# Patient Record
Sex: Female | Born: 1955 | Race: Black or African American | Hispanic: No | Smoking: Never smoker
Health system: Southern US, Community
[De-identification: ages and names within clinical notes are randomized; demographics above are authoritative.]

## PROBLEM LIST (undated history)

## (undated) DIAGNOSIS — N183 Chronic kidney disease, stage 3 unspecified: Secondary | ICD-10-CM

## (undated) DIAGNOSIS — I10 Essential (primary) hypertension: Secondary | ICD-10-CM

## (undated) DIAGNOSIS — I219 Acute myocardial infarction, unspecified: Secondary | ICD-10-CM

## (undated) DIAGNOSIS — M1990 Unspecified osteoarthritis, unspecified site: Secondary | ICD-10-CM

## (undated) DIAGNOSIS — Z992 Dependence on renal dialysis: Secondary | ICD-10-CM

## (undated) DIAGNOSIS — Z9289 Personal history of other medical treatment: Secondary | ICD-10-CM

## (undated) DIAGNOSIS — I4891 Unspecified atrial fibrillation: Secondary | ICD-10-CM

## (undated) DIAGNOSIS — E785 Hyperlipidemia, unspecified: Secondary | ICD-10-CM

## (undated) DIAGNOSIS — D649 Anemia, unspecified: Secondary | ICD-10-CM

## (undated) DIAGNOSIS — I2510 Atherosclerotic heart disease of native coronary artery without angina pectoris: Secondary | ICD-10-CM

## (undated) DIAGNOSIS — K5900 Constipation, unspecified: Secondary | ICD-10-CM

## (undated) DIAGNOSIS — E119 Type 2 diabetes mellitus without complications: Secondary | ICD-10-CM

## (undated) HISTORY — DX: Unspecified osteoarthritis, unspecified site: M19.90

## (undated) HISTORY — PX: BREAST BIOPSY: SHX20

## (undated) HISTORY — DX: Atherosclerotic heart disease of native coronary artery without angina pectoris: I25.10

## (undated) HISTORY — PX: ANKLE SURGERY: SHX546

## (undated) HISTORY — DX: Hyperlipidemia, unspecified: E78.5

## (undated) HISTORY — PX: PARTIAL HYSTERECTOMY: SHX80

## (undated) HISTORY — DX: Unspecified atrial fibrillation: I48.91

## (undated) HISTORY — PX: OTHER SURGICAL HISTORY: SHX169

---

## 1998-01-27 ENCOUNTER — Encounter: Admission: RE | Admit: 1998-01-27 | Discharge: 1998-04-27 | Payer: Self-pay | Admitting: Emergency Medicine

## 1998-11-29 ENCOUNTER — Encounter: Admission: RE | Admit: 1998-11-29 | Discharge: 1999-02-27 | Payer: Self-pay | Admitting: Emergency Medicine

## 1999-03-20 ENCOUNTER — Other Ambulatory Visit: Admission: RE | Admit: 1999-03-20 | Discharge: 1999-03-20 | Payer: Self-pay | Admitting: Radiology

## 2003-12-02 ENCOUNTER — Emergency Department (HOSPITAL_COMMUNITY): Admission: EM | Admit: 2003-12-02 | Discharge: 2003-12-02 | Payer: Self-pay | Admitting: Emergency Medicine

## 2005-08-28 ENCOUNTER — Emergency Department (HOSPITAL_COMMUNITY): Admission: EM | Admit: 2005-08-28 | Discharge: 2005-08-28 | Payer: Self-pay | Admitting: Emergency Medicine

## 2006-11-21 ENCOUNTER — Encounter: Admission: RE | Admit: 2006-11-21 | Discharge: 2006-11-21 | Payer: Self-pay | Admitting: Endocrinology

## 2006-12-02 ENCOUNTER — Other Ambulatory Visit: Admission: RE | Admit: 2006-12-02 | Discharge: 2006-12-02 | Payer: Self-pay | Admitting: Interventional Radiology

## 2006-12-02 ENCOUNTER — Encounter: Admission: RE | Admit: 2006-12-02 | Discharge: 2006-12-02 | Payer: Self-pay | Admitting: Endocrinology

## 2006-12-02 ENCOUNTER — Encounter (INDEPENDENT_AMBULATORY_CARE_PROVIDER_SITE_OTHER): Payer: Self-pay | Admitting: *Deleted

## 2009-06-14 ENCOUNTER — Inpatient Hospital Stay (HOSPITAL_COMMUNITY): Admission: EM | Admit: 2009-06-14 | Discharge: 2009-06-16 | Payer: Self-pay | Admitting: Emergency Medicine

## 2009-07-21 ENCOUNTER — Encounter: Payer: Self-pay | Admitting: Family Medicine

## 2009-07-21 ENCOUNTER — Ambulatory Visit: Payer: Self-pay | Admitting: Family Medicine

## 2009-07-21 ENCOUNTER — Observation Stay (HOSPITAL_COMMUNITY): Admission: EM | Admit: 2009-07-21 | Discharge: 2009-07-21 | Payer: Self-pay | Admitting: Emergency Medicine

## 2009-07-21 LAB — CONVERTED CEMR LAB: Microalb, Ur: 14.88 mg/dL — ABNORMAL HIGH (ref 0.00–1.89)

## 2009-10-06 ENCOUNTER — Ambulatory Visit: Payer: Self-pay | Admitting: Internal Medicine

## 2009-10-11 ENCOUNTER — Ambulatory Visit: Payer: Self-pay | Admitting: Internal Medicine

## 2009-10-11 LAB — CONVERTED CEMR LAB
ALT: 14 units/L (ref 0–35)
AST: 17 units/L (ref 0–37)
Albumin: 3.9 g/dL (ref 3.5–5.2)
Alkaline Phosphatase: 75 units/L (ref 39–117)
BUN: 24 mg/dL — ABNORMAL HIGH (ref 6–23)
Basophils Absolute: 0.1 10*3/uL (ref 0.0–0.1)
Basophils Relative: 1 % (ref 0–1)
CO2: 27 meq/L (ref 19–32)
Calcium: 9 mg/dL (ref 8.4–10.5)
Chloride: 105 meq/L (ref 96–112)
Cholesterol: 229 mg/dL — ABNORMAL HIGH (ref 0–200)
Creatinine, Ser: 0.94 mg/dL (ref 0.40–1.20)
Eosinophils Absolute: 0.2 10*3/uL (ref 0.0–0.7)
Eosinophils Relative: 4 % (ref 0–5)
Glucose, Bld: 148 mg/dL — ABNORMAL HIGH (ref 70–99)
HCT: 35.2 % — ABNORMAL LOW (ref 36.0–46.0)
HDL: 58 mg/dL (ref >39)
Hemoglobin: 10.8 g/dL — ABNORMAL LOW (ref 12.0–15.0)
LDL Cholesterol: 155 mg/dL — ABNORMAL HIGH (ref 0–99)
Lymphocytes Relative: 34 % (ref 12–46)
Lymphs Abs: 2 10*3/uL (ref 0.7–4.0)
MCHC: 30.7 g/dL (ref 30.0–36.0)
MCV: 82.4 fL (ref 78.0–100.0)
Monocytes Absolute: 0.4 10*3/uL (ref 0.1–1.0)
Monocytes Relative: 7 % (ref 3–12)
Neutro Abs: 3.1 10*3/uL (ref 1.7–7.7)
Neutrophils Relative %: 54 % (ref 43–77)
Platelets: 291 10*3/uL (ref 150–400)
Potassium: 4.9 meq/L (ref 3.5–5.3)
RBC: 4.27 M/uL (ref 3.87–5.11)
RDW: 14.2 % (ref 11.5–15.5)
Sodium: 140 meq/L (ref 135–145)
TSH: 0.731 microintl units/mL (ref 0.350–4.500)
Total Bilirubin: 0.3 mg/dL (ref 0.3–1.2)
Total CHOL/HDL Ratio: 3.9
Total Protein: 7 g/dL (ref 6.0–8.3)
Triglycerides: 82 mg/dL (ref <150)
VLDL: 16 mg/dL (ref 0–40)
WBC: 5.8 10*3/uL (ref 4.0–10.5)

## 2009-10-14 ENCOUNTER — Encounter (INDEPENDENT_AMBULATORY_CARE_PROVIDER_SITE_OTHER): Payer: Self-pay | Admitting: Family Medicine

## 2009-10-14 LAB — CONVERTED CEMR LAB
Ferritin: 42 ng/mL (ref 10–291)
Iron: 38 ug/dL — ABNORMAL LOW (ref 42–145)
Saturation Ratios: 13 % — ABNORMAL LOW (ref 20–55)
TIBC: 289 ug/dL (ref 250–470)
UIBC: 251 ug/dL

## 2009-10-21 ENCOUNTER — Ambulatory Visit: Payer: Self-pay | Admitting: Internal Medicine

## 2009-12-21 ENCOUNTER — Ambulatory Visit: Payer: Self-pay | Admitting: Internal Medicine

## 2009-12-30 ENCOUNTER — Encounter (INDEPENDENT_AMBULATORY_CARE_PROVIDER_SITE_OTHER): Payer: Self-pay | Admitting: Family Medicine

## 2009-12-30 ENCOUNTER — Ambulatory Visit: Payer: Self-pay | Admitting: Internal Medicine

## 2009-12-30 LAB — CONVERTED CEMR LAB
ALT: 17 units/L (ref 0–35)
AST: 20 units/L (ref 0–37)
Albumin: 3.9 g/dL (ref 3.5–5.2)
Alkaline Phosphatase: 74 units/L (ref 39–117)
BUN: 30 mg/dL — ABNORMAL HIGH (ref 6–23)
Basophils Absolute: 0 10*3/uL (ref 0.0–0.1)
Basophils Relative: 1 % (ref 0–1)
CO2: 24 meq/L (ref 19–32)
Calcium: 8.9 mg/dL (ref 8.4–10.5)
Chloride: 103 meq/L (ref 96–112)
Cholesterol: 212 mg/dL — ABNORMAL HIGH (ref 0–200)
Creatinine, Ser: 1.17 mg/dL (ref 0.40–1.20)
Eosinophils Absolute: 0.1 10*3/uL (ref 0.0–0.7)
Eosinophils Relative: 2 % (ref 0–5)
Glucose, Bld: 155 mg/dL — ABNORMAL HIGH (ref 70–99)
HCT: 32.8 % — ABNORMAL LOW (ref 36.0–46.0)
HDL: 58 mg/dL (ref >39)
Hemoglobin: 9.9 g/dL — ABNORMAL LOW (ref 12.0–15.0)
LDL Cholesterol: 132 mg/dL — ABNORMAL HIGH (ref 0–99)
Lymphocytes Relative: 28 % (ref 12–46)
Lymphs Abs: 2.2 10*3/uL (ref 0.7–4.0)
MCHC: 30.2 g/dL (ref 30.0–36.0)
MCV: 84.3 fL (ref 78.0–100.0)
Monocytes Absolute: 0.5 10*3/uL (ref 0.1–1.0)
Monocytes Relative: 6 % (ref 3–12)
Neutro Abs: 4.9 10*3/uL (ref 1.7–7.7)
Neutrophils Relative %: 64 % (ref 43–77)
Platelets: 342 10*3/uL (ref 150–400)
Potassium: 5.1 meq/L (ref 3.5–5.3)
RBC: 3.89 M/uL (ref 3.87–5.11)
RDW: 14 % (ref 11.5–15.5)
Sodium: 139 meq/L (ref 135–145)
Total Bilirubin: 0.3 mg/dL (ref 0.3–1.2)
Total CHOL/HDL Ratio: 3.7
Total Protein: 6.7 g/dL (ref 6.0–8.3)
Triglycerides: 112 mg/dL (ref <150)
VLDL: 22 mg/dL (ref 0–40)
WBC: 7.7 10*3/uL (ref 4.0–10.5)

## 2010-01-09 ENCOUNTER — Ambulatory Visit: Payer: Self-pay | Admitting: Internal Medicine

## 2010-01-25 ENCOUNTER — Ambulatory Visit (HOSPITAL_COMMUNITY): Admission: RE | Admit: 2010-01-25 | Discharge: 2010-01-25 | Payer: Self-pay | Admitting: Family Medicine

## 2010-02-27 ENCOUNTER — Emergency Department (HOSPITAL_COMMUNITY): Admission: EM | Admit: 2010-02-27 | Discharge: 2010-02-27 | Payer: Self-pay | Admitting: Emergency Medicine

## 2010-03-10 ENCOUNTER — Ambulatory Visit: Payer: Self-pay | Admitting: Internal Medicine

## 2010-05-03 ENCOUNTER — Encounter (INDEPENDENT_AMBULATORY_CARE_PROVIDER_SITE_OTHER): Payer: Self-pay | Admitting: Family Medicine

## 2010-05-03 ENCOUNTER — Ambulatory Visit: Payer: Self-pay | Admitting: Internal Medicine

## 2010-05-03 LAB — CONVERTED CEMR LAB
ALT: 15 units/L (ref 0–35)
AST: 14 units/L (ref 0–37)
Albumin: 4.1 g/dL (ref 3.5–5.2)
Alkaline Phosphatase: 79 units/L (ref 39–117)
BUN: 29 mg/dL — ABNORMAL HIGH (ref 6–23)
Basophils Absolute: 0 10*3/uL (ref 0.0–0.1)
Basophils Relative: 0 % (ref 0–1)
CO2: 23 meq/L (ref 19–32)
Calcium: 9.4 mg/dL (ref 8.4–10.5)
Chloride: 99 meq/L (ref 96–112)
Creatinine, Ser: 1.26 mg/dL — ABNORMAL HIGH (ref 0.40–1.20)
Eosinophils Absolute: 0.2 10*3/uL (ref 0.0–0.7)
Eosinophils Relative: 3 % (ref 0–5)
Glucose, Bld: 315 mg/dL — ABNORMAL HIGH (ref 70–99)
HCT: 34.4 % — ABNORMAL LOW (ref 36.0–46.0)
Hemoglobin: 11 g/dL — ABNORMAL LOW (ref 12.0–15.0)
Hgb A1c MFr Bld: 10.2 % — ABNORMAL HIGH (ref <5.7)
Lymphocytes Relative: 26 % (ref 12–46)
Lymphs Abs: 1.9 10*3/uL (ref 0.7–4.0)
MCHC: 32 g/dL (ref 30.0–36.0)
MCV: 81.7 fL (ref 78.0–100.0)
Monocytes Absolute: 0.4 10*3/uL (ref 0.1–1.0)
Monocytes Relative: 6 % (ref 3–12)
Neutro Abs: 4.8 10*3/uL (ref 1.7–7.7)
Neutrophils Relative %: 65 % (ref 43–77)
Platelets: 323 10*3/uL (ref 150–400)
Potassium: 5.2 meq/L (ref 3.5–5.3)
RBC: 4.21 M/uL (ref 3.87–5.11)
RDW: 13.5 % (ref 11.5–15.5)
Sodium: 134 meq/L — ABNORMAL LOW (ref 135–145)
Total Bilirubin: 0.3 mg/dL (ref 0.3–1.2)
Total Protein: 7.3 g/dL (ref 6.0–8.3)
WBC: 7.3 10*3/uL (ref 4.0–10.5)

## 2010-07-12 ENCOUNTER — Ambulatory Visit: Payer: Self-pay | Admitting: Internal Medicine

## 2010-08-01 ENCOUNTER — Observation Stay (HOSPITAL_COMMUNITY)
Admission: EM | Admit: 2010-08-01 | Discharge: 2010-08-01 | Payer: Self-pay | Source: Home / Self Care | Admitting: Emergency Medicine

## 2010-08-09 ENCOUNTER — Encounter (INDEPENDENT_AMBULATORY_CARE_PROVIDER_SITE_OTHER): Payer: Self-pay | Admitting: *Deleted

## 2010-08-09 LAB — CONVERTED CEMR LAB
BUN: 27 mg/dL — ABNORMAL HIGH (ref 6–23)
CO2: 25 meq/L (ref 19–32)
Calcium: 9 mg/dL (ref 8.4–10.5)
Chloride: 101 meq/L (ref 96–112)
Cholesterol: 213 mg/dL — ABNORMAL HIGH (ref 0–200)
Creatinine, Ser: 1.38 mg/dL — ABNORMAL HIGH (ref 0.40–1.20)
Ferritin: 155 ng/mL (ref 10–291)
Free Thyroxine Index: 2.5 (ref 1.0–3.9)
Glucose, Bld: 353 mg/dL — ABNORMAL HIGH (ref 70–99)
HCT: 32.9 % — ABNORMAL LOW (ref 36.0–46.0)
HDL: 57 mg/dL (ref >39)
Hemoglobin: 10.3 g/dL — ABNORMAL LOW (ref 12.0–15.0)
Iron: 71 ug/dL (ref 42–145)
LDL Cholesterol: 135 mg/dL — ABNORMAL HIGH (ref 0–99)
MCHC: 31.3 g/dL (ref 30.0–36.0)
MCV: 83.9 fL (ref 78.0–100.0)
Platelets: 368 10*3/uL (ref 150–400)
Potassium: 5.3 meq/L (ref 3.5–5.3)
RBC: 3.92 M/uL (ref 3.87–5.11)
RDW: 13.7 % (ref 11.5–15.5)
Saturation Ratios: 28 % (ref 20–55)
Sodium: 135 meq/L (ref 135–145)
T3 Uptake Ratio: 36.8 % (ref 22.5–37.0)
T4, Total: 6.9 ug/dL (ref 5.0–12.5)
TIBC: 254 ug/dL (ref 250–470)
TSH: 0.487 microintl units/mL (ref 0.350–4.500)
Total CHOL/HDL Ratio: 3.7
Triglycerides: 107 mg/dL (ref <150)
UIBC: 183 ug/dL
VLDL: 21 mg/dL (ref 0–40)
WBC: 7.4 10*3/uL (ref 4.0–10.5)

## 2010-08-15 ENCOUNTER — Ambulatory Visit (HOSPITAL_COMMUNITY)
Admission: RE | Admit: 2010-08-15 | Discharge: 2010-08-15 | Payer: Self-pay | Source: Home / Self Care | Admitting: Family Medicine

## 2010-08-31 ENCOUNTER — Emergency Department (HOSPITAL_COMMUNITY): Admission: EM | Admit: 2010-08-31 | Discharge: 2010-02-22 | Payer: Self-pay | Admitting: Emergency Medicine

## 2010-09-21 ENCOUNTER — Encounter (INDEPENDENT_AMBULATORY_CARE_PROVIDER_SITE_OTHER): Payer: Self-pay | Admitting: *Deleted

## 2010-09-21 LAB — CONVERTED CEMR LAB
ALT: 24 units/L (ref 0–35)
AST: 21 units/L (ref 0–37)
Albumin: 4.1 g/dL (ref 3.5–5.2)
Alkaline Phosphatase: 69 units/L (ref 39–117)
BUN: 40 mg/dL — ABNORMAL HIGH (ref 6–23)
CO2: 24 meq/L (ref 19–32)
Calcium: 8.9 mg/dL (ref 8.4–10.5)
Chloride: 103 meq/L (ref 96–112)
Creatinine, Ser: 1.33 mg/dL — ABNORMAL HIGH (ref 0.40–1.20)
Glucose, Bld: 218 mg/dL — ABNORMAL HIGH (ref 70–99)
Potassium: 4.9 meq/L (ref 3.5–5.3)
Sodium: 139 meq/L (ref 135–145)
Total Bilirubin: 0.3 mg/dL (ref 0.3–1.2)
Total Protein: 7.3 g/dL (ref 6.0–8.3)

## 2010-10-03 ENCOUNTER — Ambulatory Visit (HOSPITAL_COMMUNITY)
Admission: RE | Admit: 2010-10-03 | Discharge: 2010-10-04 | Payer: Self-pay | Source: Home / Self Care | Attending: Ophthalmology | Admitting: Ophthalmology

## 2010-10-09 LAB — GLUCOSE, CAPILLARY
Glucose-Capillary: 137 mg/dL — ABNORMAL HIGH (ref 70–99)
Glucose-Capillary: 51 mg/dL — ABNORMAL LOW (ref 70–99)
Glucose-Capillary: 80 mg/dL (ref 70–99)
Glucose-Capillary: 82 mg/dL (ref 70–99)
Glucose-Capillary: 140 mg/dL — ABNORMAL HIGH (ref 70–99)
Glucose-Capillary: 167 mg/dL — ABNORMAL HIGH (ref 70–99)
Glucose-Capillary: 173 mg/dL — ABNORMAL HIGH (ref 70–99)

## 2010-10-09 LAB — BASIC METABOLIC PANEL
BUN: 20 mg/dL (ref 6–23)
CO2: 25 mEq/L (ref 19–32)
Calcium: 9.1 mg/dL (ref 8.4–10.5)
Chloride: 105 mEq/L (ref 96–112)
Creatinine, Ser: 1.22 mg/dL — ABNORMAL HIGH (ref 0.4–1.2)
GFR calc Af Amer: 56 mL/min — ABNORMAL LOW (ref >60)
GFR calc non Af Amer: 46 mL/min — ABNORMAL LOW (ref >60)
Glucose, Bld: 79 mg/dL (ref 70–99)
Potassium: 4.7 mEq/L (ref 3.5–5.1)
Sodium: 138 mEq/L (ref 135–145)

## 2010-10-09 LAB — CBC
HCT: 33.3 % — ABNORMAL LOW (ref 36.0–46.0)
Hemoglobin: 10.7 g/dL — ABNORMAL LOW (ref 12.0–15.0)
MCH: 26.6 pg (ref 26.0–34.0)
MCHC: 32.1 g/dL (ref 30.0–36.0)
MCV: 82.6 fL (ref 78.0–100.0)
Platelets: 327 10*3/uL (ref 150–400)
RBC: 4.03 MIL/uL (ref 3.87–5.11)
RDW: 13.5 % (ref 11.5–15.5)
WBC: 6.7 10*3/uL (ref 4.0–10.5)

## 2010-10-09 LAB — SURGICAL PCR SCREEN
MRSA, PCR: NEGATIVE
Staphylococcus aureus: NEGATIVE

## 2010-12-05 LAB — CBC
HCT: 34.4 % — ABNORMAL LOW (ref 36.0–46.0)
Hemoglobin: 11.3 g/dL — ABNORMAL LOW (ref 12.0–15.0)
MCH: 26.1 pg (ref 26.0–34.0)
MCHC: 32.8 g/dL (ref 30.0–36.0)
MCV: 79.4 fL (ref 78.0–100.0)
Platelets: 340 10*3/uL (ref 150–400)
RBC: 4.33 MIL/uL (ref 3.87–5.11)
RDW: 12.8 % (ref 11.5–15.5)
WBC: 7.3 10*3/uL (ref 4.0–10.5)

## 2010-12-05 LAB — DIFFERENTIAL
Basophils Absolute: 0.1 10*3/uL (ref 0.0–0.1)
Basophils Relative: 1 % (ref 0–1)
Eosinophils Absolute: 0.2 10*3/uL (ref 0.0–0.7)
Eosinophils Relative: 2 % (ref 0–5)
Lymphocytes Relative: 26 % (ref 12–46)
Lymphs Abs: 1.9 10*3/uL (ref 0.7–4.0)
Monocytes Absolute: 0.4 10*3/uL (ref 0.1–1.0)
Monocytes Relative: 6 % (ref 3–12)
Neutro Abs: 4.7 10*3/uL (ref 1.7–7.7)
Neutrophils Relative %: 65 % (ref 43–77)

## 2010-12-05 LAB — GLUCOSE, CAPILLARY
Glucose-Capillary: 215 mg/dL — ABNORMAL HIGH (ref 70–99)
Glucose-Capillary: 385 mg/dL — ABNORMAL HIGH (ref 70–99)
Glucose-Capillary: 427 mg/dL — ABNORMAL HIGH (ref 70–99)
Glucose-Capillary: 465 mg/dL — ABNORMAL HIGH (ref 70–99)
Glucose-Capillary: 484 mg/dL — ABNORMAL HIGH (ref 70–99)

## 2010-12-05 LAB — POCT I-STAT, CHEM 8
BUN: 20 mg/dL (ref 6–23)
Calcium, Ion: 1.19 mmol/L (ref 1.12–1.32)
Chloride: 99 mEq/L (ref 96–112)
Creatinine, Ser: 1.2 mg/dL (ref 0.4–1.2)
Glucose, Bld: 525 mg/dL — ABNORMAL HIGH (ref 70–99)
HCT: 38 % (ref 36.0–46.0)
Hemoglobin: 12.9 g/dL (ref 12.0–15.0)
Potassium: 4.2 mEq/L (ref 3.5–5.1)
Sodium: 133 mEq/L — ABNORMAL LOW (ref 135–145)
TCO2: 28 mmol/L (ref 0–100)

## 2010-12-05 LAB — URINALYSIS, ROUTINE W REFLEX MICROSCOPIC
Bilirubin Urine: NEGATIVE
Ketones, ur: 15 mg/dL — AB
Nitrite: NEGATIVE
Urobilinogen, UA: 0.2 mg/dL (ref 0.0–1.0)

## 2010-12-05 LAB — POCT I-STAT 3, VENOUS BLOOD GAS (G3P V)
TCO2: 31 mmol/L (ref 0–100)
pCO2, Ven: 57.8 mmHg — ABNORMAL HIGH (ref 45.0–50.0)
pH, Ven: 7.312 — ABNORMAL HIGH (ref 7.250–7.300)

## 2010-12-28 LAB — DIFFERENTIAL
Basophils Absolute: 0.1 10*3/uL (ref 0.0–0.1)
Basophils Relative: 1 % (ref 0–1)
Eosinophils Absolute: 0.2 10*3/uL (ref 0.0–0.7)
Eosinophils Relative: 2 % (ref 0–5)
Monocytes Absolute: 0.3 10*3/uL (ref 0.1–1.0)
Monocytes Relative: 4 % (ref 3–12)
Neutro Abs: 4.3 10*3/uL (ref 1.7–7.7)

## 2010-12-28 LAB — URINALYSIS, ROUTINE W REFLEX MICROSCOPIC
Hgb urine dipstick: NEGATIVE
Ketones, ur: NEGATIVE mg/dL
Leukocytes, UA: NEGATIVE
Protein, ur: 30 mg/dL — AB
Urobilinogen, UA: 0.2 mg/dL (ref 0.0–1.0)

## 2010-12-28 LAB — BASIC METABOLIC PANEL
CO2: 28 mEq/L (ref 19–32)
Calcium: 9.3 mg/dL (ref 8.4–10.5)
Chloride: 98 mEq/L (ref 96–112)
Glucose, Bld: 478 mg/dL — ABNORMAL HIGH (ref 70–99)
Sodium: 134 mEq/L — ABNORMAL LOW (ref 135–145)

## 2010-12-28 LAB — GLUCOSE, CAPILLARY
Glucose-Capillary: 366 mg/dL — ABNORMAL HIGH (ref 70–99)
Glucose-Capillary: 503 mg/dL (ref 70–99)

## 2010-12-28 LAB — CBC
Hemoglobin: 12 g/dL (ref 12.0–15.0)
MCHC: 33.4 g/dL (ref 30.0–36.0)
MCV: 82.5 fL (ref 78.0–100.0)
RDW: 14.4 % (ref 11.5–15.5)

## 2010-12-28 LAB — POCT I-STAT, CHEM 8
Calcium, Ion: 1.26 mmol/L (ref 1.12–1.32)
Chloride: 99 mEq/L (ref 96–112)
Glucose, Bld: 471 mg/dL — ABNORMAL HIGH (ref 70–99)
HCT: 37 % (ref 36.0–46.0)
Hemoglobin: 12.6 g/dL (ref 12.0–15.0)
TCO2: 28 mmol/L (ref 0–100)

## 2010-12-28 LAB — URINE MICROSCOPIC-ADD ON

## 2010-12-29 LAB — POCT I-STAT 3, VENOUS BLOOD GAS (G3P V)
Bicarbonate: 17.5 mEq/L — ABNORMAL LOW (ref 20.0–24.0)
O2 Saturation: 39 %
TCO2: 18 mmol/L (ref 0–100)
pCO2, Ven: 23.5 mmHg — ABNORMAL LOW (ref 45.0–50.0)
pH, Ven: 7.443 — ABNORMAL HIGH (ref 7.250–7.300)
pO2, Ven: 14 mmHg — CL (ref 30.0–45.0)

## 2010-12-29 LAB — GLUCOSE, CAPILLARY
Glucose-Capillary: 140 mg/dL — ABNORMAL HIGH (ref 70–99)
Glucose-Capillary: 168 mg/dL — ABNORMAL HIGH (ref 70–99)
Glucose-Capillary: 184 mg/dL — ABNORMAL HIGH (ref 70–99)
Glucose-Capillary: 186 mg/dL — ABNORMAL HIGH (ref 70–99)
Glucose-Capillary: 189 mg/dL — ABNORMAL HIGH (ref 70–99)
Glucose-Capillary: 196 mg/dL — ABNORMAL HIGH (ref 70–99)
Glucose-Capillary: 208 mg/dL — ABNORMAL HIGH (ref 70–99)
Glucose-Capillary: 229 mg/dL — ABNORMAL HIGH (ref 70–99)
Glucose-Capillary: 262 mg/dL — ABNORMAL HIGH (ref 70–99)
Glucose-Capillary: 266 mg/dL — ABNORMAL HIGH (ref 70–99)
Glucose-Capillary: 336 mg/dL — ABNORMAL HIGH (ref 70–99)
Glucose-Capillary: 504 mg/dL (ref 70–99)

## 2010-12-29 LAB — CBC
HCT: 30.3 % — ABNORMAL LOW (ref 36.0–46.0)
HCT: 36.9 % (ref 36.0–46.0)
Hemoglobin: 12 g/dL (ref 12.0–15.0)
Hemoglobin: 9.9 g/dL — ABNORMAL LOW (ref 12.0–15.0)
MCHC: 32.6 g/dL (ref 30.0–36.0)
MCV: 83.5 fL (ref 78.0–100.0)
Platelets: 448 10*3/uL — ABNORMAL HIGH (ref 150–400)
Platelets: 468 10*3/uL — ABNORMAL HIGH (ref 150–400)
RBC: 3.61 MIL/uL — ABNORMAL LOW (ref 3.87–5.11)
RDW: 13.1 % (ref 11.5–15.5)
WBC: 15 10*3/uL — ABNORMAL HIGH (ref 4.0–10.5)
WBC: 8.8 10*3/uL (ref 4.0–10.5)

## 2010-12-29 LAB — URINALYSIS, ROUTINE W REFLEX MICROSCOPIC
Bilirubin Urine: NEGATIVE
Glucose, UA: >1000 mg/dL — AB
Specific Gravity, Urine: 1.023 (ref 1.005–1.030)
Urobilinogen, UA: 0.2 mg/dL (ref 0.0–1.0)
pH: 5 (ref 5.0–8.0)

## 2010-12-29 LAB — HEMOGLOBIN A1C
Hgb A1c MFr Bld: 17.3 % — ABNORMAL HIGH (ref 4.6–6.1)
Hgb A1c MFr Bld: 17.7 % — ABNORMAL HIGH (ref 4.6–6.1)
Mean Plasma Glucose: 450 mg/dL
Mean Plasma Glucose: 461 mg/dL

## 2010-12-29 LAB — COMPREHENSIVE METABOLIC PANEL
Albumin: 3.4 g/dL — ABNORMAL LOW (ref 3.5–5.2)
BUN: 25 mg/dL — ABNORMAL HIGH (ref 6–23)
Calcium: 10.6 mg/dL — ABNORMAL HIGH (ref 8.4–10.5)
Glucose, Bld: 573 mg/dL (ref 70–99)
Potassium: 5.2 mEq/L — ABNORMAL HIGH (ref 3.5–5.1)
Sodium: 136 mEq/L (ref 135–145)
Total Protein: 8.4 g/dL — ABNORMAL HIGH (ref 6.0–8.3)

## 2010-12-29 LAB — BASIC METABOLIC PANEL
BUN: 20 mg/dL (ref 6–23)
CO2: 21 mEq/L (ref 19–32)
CO2: 29 mEq/L (ref 19–32)
Calcium: 9.3 mg/dL (ref 8.4–10.5)
Calcium: 9.7 mg/dL (ref 8.4–10.5)
Chloride: 102 mEq/L (ref 96–112)
Chloride: 105 mEq/L (ref 96–112)
Creatinine, Ser: 1.12 mg/dL (ref 0.4–1.2)
Creatinine, Ser: 1.62 mg/dL — ABNORMAL HIGH (ref 0.4–1.2)
Creatinine, Ser: 1.73 mg/dL — ABNORMAL HIGH (ref 0.4–1.2)
GFR calc Af Amer: 37 mL/min — ABNORMAL LOW (ref >60)
GFR calc Af Amer: 40 mL/min — ABNORMAL LOW (ref >60)
GFR calc Af Amer: >60 mL/min (ref >60)
GFR calc non Af Amer: 31 mL/min — ABNORMAL LOW (ref >60)
GFR calc non Af Amer: >60 mL/min (ref >60)
Glucose, Bld: 133 mg/dL — ABNORMAL HIGH (ref 70–99)
Glucose, Bld: 340 mg/dL — ABNORMAL HIGH (ref 70–99)
Glucose, Bld: 362 mg/dL — ABNORMAL HIGH (ref 70–99)
Potassium: 3.5 mEq/L (ref 3.5–5.1)
Potassium: 3.5 mEq/L (ref 3.5–5.1)
Potassium: 4.1 mEq/L (ref 3.5–5.1)
Sodium: 134 mEq/L — ABNORMAL LOW (ref 135–145)
Sodium: 139 mEq/L (ref 135–145)

## 2010-12-29 LAB — URINE MICROSCOPIC-ADD ON

## 2010-12-29 LAB — FERRITIN: Ferritin: 215 ng/mL (ref 10–291)

## 2010-12-29 LAB — TSH: TSH: 0.886 u[IU]/mL (ref 0.350–4.500)

## 2010-12-29 LAB — LIPID PANEL
LDL Cholesterol: 191 mg/dL — ABNORMAL HIGH (ref 0–99)
VLDL: 27 mg/dL (ref 0–40)

## 2010-12-29 LAB — IRON AND TIBC: UIBC: 143 ug/dL

## 2010-12-29 LAB — DIFFERENTIAL
Lymphs Abs: 1.2 10*3/uL (ref 0.7–4.0)
Monocytes Absolute: 0.6 10*3/uL (ref 0.1–1.0)
Monocytes Relative: 4 % (ref 3–12)
Neutro Abs: 13.6 10*3/uL — ABNORMAL HIGH (ref 1.7–7.7)
Neutrophils Relative %: 89 % — ABNORMAL HIGH (ref 43–77)

## 2010-12-29 LAB — KETONES, QUALITATIVE

## 2010-12-29 LAB — LACTIC ACID, PLASMA: Lactic Acid, Venous: 0.6 mmol/L (ref 0.5–2.2)

## 2011-02-20 ENCOUNTER — Ambulatory Visit (HOSPITAL_COMMUNITY)
Admission: RE | Admit: 2011-02-20 | Discharge: 2011-02-21 | Disposition: A | Discharge status: Home / Self Care | Source: Ambulatory Visit | Attending: Ophthalmology | Admitting: Ophthalmology

## 2011-02-20 DIAGNOSIS — H3340 Traction detachment of retina, unspecified eye: Secondary | ICD-10-CM | POA: Insufficient documentation

## 2011-02-20 DIAGNOSIS — D573 Sickle-cell trait: Secondary | ICD-10-CM | POA: Insufficient documentation

## 2011-02-20 DIAGNOSIS — E11359 Type 2 diabetes mellitus with proliferative diabetic retinopathy without macular edema: Secondary | ICD-10-CM | POA: Insufficient documentation

## 2011-02-20 DIAGNOSIS — E1139 Type 2 diabetes mellitus with other diabetic ophthalmic complication: Secondary | ICD-10-CM | POA: Insufficient documentation

## 2011-02-20 DIAGNOSIS — I10 Essential (primary) hypertension: Secondary | ICD-10-CM | POA: Insufficient documentation

## 2011-02-20 LAB — BASIC METABOLIC PANEL
Chloride: 104 mEq/L (ref 96–112)
Creatinine, Ser: 1.25 mg/dL — ABNORMAL HIGH (ref 0.4–1.2)
GFR calc Af Amer: 54 mL/min — ABNORMAL LOW (ref >60)
GFR calc non Af Amer: 44 mL/min — ABNORMAL LOW (ref >60)
Potassium: 4.4 mEq/L (ref 3.5–5.1)

## 2011-02-20 LAB — SURGICAL PCR SCREEN
MRSA, PCR: NEGATIVE
Staphylococcus aureus: NEGATIVE

## 2011-02-20 LAB — GLUCOSE, CAPILLARY
Glucose-Capillary: 146 mg/dL — ABNORMAL HIGH (ref 70–99)
Glucose-Capillary: 201 mg/dL — ABNORMAL HIGH (ref 70–99)

## 2011-02-20 LAB — CBC
Platelets: 358 10*3/uL (ref 150–400)
RBC: 3.68 MIL/uL — ABNORMAL LOW (ref 3.87–5.11)
WBC: 9.9 10*3/uL (ref 4.0–10.5)

## 2011-02-21 LAB — GLUCOSE, CAPILLARY: Glucose-Capillary: 182 mg/dL — ABNORMAL HIGH (ref 70–99)

## 2011-02-22 LAB — GLUCOSE, CAPILLARY: Glucose-Capillary: 128 mg/dL — ABNORMAL HIGH (ref 70–99)

## 2011-02-22 NOTE — Op Note (Signed)
  [name], [name]             ACCOUNT NO.:  [id number]  MEDICAL RECORD NO.:  [id number]           PATIENT TYPE:  I  LOCATION:  [id number]                         FACILITY:  [facility]  PHYSICIAN:  Chrystie Nose. Zigmund Daniel, M.D. DATE OF BIRTH:  November 23, 1955  DATE OF PROCEDURE:  02/20/2011 DATE OF DISCHARGE:                              OPERATIVE REPORT   ADMISSION DIAGNOSES:  Tractional retinal detachment, proliferative diabetic retinopathy, and vitreous hemorrhage, right eye.  PROCEDURE:  Repair of complex tractional retinal detachment with pars plana vitrectomy, retinal photocoagulation, gas-fluid exchange, endodiathermy membrane peel.  SURGEON:  Chrystie Nose. Zigmund Daniel, MD  ASSISTANT:  Deatra Ina, MA  ANESTHESIA:  General.  DETAILS:  Usual prep and drape, a 25 gauge trocar was placed at 8, 10, and 2 o'clock, infusion at 8 o'clock.  Provisc was placed on the corneal surface.  The pars plana vitrectomy was begun just behind the cataractous lens where dense vitreous hemorrhage and strands were encountered.  These were carefully removed under low suction and rapid cutting.  There was severe traction detachment in the macular region and the disk and the nasal aspect of the retina.  This area was circumcised with the vitreous cutter and the ring was incised with the vitreous cutter and the lighted pick.  The stumps were cauterized with endodiathermy.  The vitrectomy was carried out into the midperiphery and the surface proliferation was removed.  Then, out to the far periphery where the vitreous base was trimmed.  Once this was accomplished, the endolaser was positioned in the eye, 1485 burns were placed around the retinal periphery.  The power was 1000 milliwatts, 1000 microns each and 0.1 seconds each.  A total gas-fluid exchange was performed.  C3F8 in a 10% concentration was injected into the vitreous cavity for a moderate fill.  Additional fluid was vacuumed from the surface of the  retina after several minutes to ensure a good fill.  The instruments were removed from the eye and the wounds were tested and found to be tight. Polymyxin and gentamicin were irrigated into [place] space.  Atropine solution was applied.  Marcaine was injected around the globe for postop pain.  Decadron 10 mg was injected into the lower subconjunctival space. Closing pressure was 15 with a Barraquer tonometer.  Complications were none.  Duration 1 hour.  The patient was awakened and taken to recovery in satisfactory condition.     Chrystie Nose. Zigmund Daniel, M.D.    JDM/MEDQ  D:  02/20/2011  T:  02/21/2011  Job:  [id number]  Electronically Signed by Tempie Hoist M.D. on 02/22/2011 07:23:59 AM

## 2011-06-19 ENCOUNTER — Encounter (INDEPENDENT_AMBULATORY_CARE_PROVIDER_SITE_OTHER): Admitting: Ophthalmology

## 2011-06-19 DIAGNOSIS — H2510 Age-related nuclear cataract, unspecified eye: Secondary | ICD-10-CM

## 2011-06-19 DIAGNOSIS — E11359 Type 2 diabetes mellitus with proliferative diabetic retinopathy without macular edema: Secondary | ICD-10-CM

## 2011-07-25 ENCOUNTER — Inpatient Hospital Stay (HOSPITAL_COMMUNITY)
Admission: EM | Admit: 2011-07-25 | Discharge: 2011-07-27 | DRG: 812 | Disposition: A | Discharge status: Home / Self Care | Attending: Family Medicine | Admitting: Family Medicine

## 2011-07-25 ENCOUNTER — Emergency Department (HOSPITAL_COMMUNITY)

## 2011-07-25 DIAGNOSIS — I129 Hypertensive chronic kidney disease with stage 1 through stage 4 chronic kidney disease, or unspecified chronic kidney disease: Secondary | ICD-10-CM | POA: Diagnosis present

## 2011-07-25 DIAGNOSIS — I519 Heart disease, unspecified: Secondary | ICD-10-CM | POA: Diagnosis present

## 2011-07-25 DIAGNOSIS — D638 Anemia in other chronic diseases classified elsewhere: Secondary | ICD-10-CM | POA: Diagnosis present

## 2011-07-25 DIAGNOSIS — D649 Anemia, unspecified: Principal | ICD-10-CM | POA: Diagnosis present

## 2011-07-25 DIAGNOSIS — R0609 Other forms of dyspnea: Secondary | ICD-10-CM | POA: Diagnosis present

## 2011-07-25 DIAGNOSIS — R82998 Other abnormal findings in urine: Secondary | ICD-10-CM | POA: Diagnosis present

## 2011-07-25 DIAGNOSIS — R0789 Other chest pain: Secondary | ICD-10-CM | POA: Diagnosis present

## 2011-07-25 DIAGNOSIS — N182 Chronic kidney disease, stage 2 (mild): Secondary | ICD-10-CM | POA: Diagnosis present

## 2011-07-25 DIAGNOSIS — H548 Legal blindness, as defined in USA: Secondary | ICD-10-CM | POA: Diagnosis present

## 2011-07-25 DIAGNOSIS — E039 Hypothyroidism, unspecified: Secondary | ICD-10-CM | POA: Diagnosis present

## 2011-07-25 DIAGNOSIS — R5381 Other malaise: Secondary | ICD-10-CM | POA: Diagnosis present

## 2011-07-25 DIAGNOSIS — Z79899 Other long term (current) drug therapy: Secondary | ICD-10-CM

## 2011-07-25 DIAGNOSIS — M549 Dorsalgia, unspecified: Secondary | ICD-10-CM | POA: Diagnosis present

## 2011-07-25 DIAGNOSIS — R0989 Other specified symptoms and signs involving the circulatory and respiratory systems: Secondary | ICD-10-CM | POA: Diagnosis present

## 2011-07-25 DIAGNOSIS — R809 Proteinuria, unspecified: Secondary | ICD-10-CM | POA: Diagnosis present

## 2011-07-25 DIAGNOSIS — D573 Sickle-cell trait: Secondary | ICD-10-CM | POA: Diagnosis present

## 2011-07-25 DIAGNOSIS — R5383 Other fatigue: Secondary | ICD-10-CM | POA: Diagnosis present

## 2011-07-25 DIAGNOSIS — E785 Hyperlipidemia, unspecified: Secondary | ICD-10-CM | POA: Diagnosis present

## 2011-07-25 DIAGNOSIS — E119 Type 2 diabetes mellitus without complications: Secondary | ICD-10-CM | POA: Diagnosis present

## 2011-07-25 LAB — CBC
HCT: 31.2 % — ABNORMAL LOW (ref 36.0–46.0)
Hemoglobin: 9.8 g/dL — ABNORMAL LOW (ref 12.0–15.0)
MCH: 25.7 pg — ABNORMAL LOW (ref 26.0–34.0)
MCHC: 31.4 g/dL (ref 30.0–36.0)
MCV: 81.7 fL (ref 78.0–100.0)
Platelets: 362 10*3/uL (ref 150–400)
RBC: 3.82 MIL/uL — ABNORMAL LOW (ref 3.87–5.11)
RDW: 13.2 % (ref 11.5–15.5)
WBC: 7.7 10*3/uL (ref 4.0–10.5)

## 2011-07-25 LAB — COMPREHENSIVE METABOLIC PANEL WITH GFR
ALT: 13 U/L (ref 0–35)
AST: 15 U/L (ref 0–37)
Albumin: 3.4 g/dL — ABNORMAL LOW (ref 3.5–5.2)
Alkaline Phosphatase: 96 U/L (ref 39–117)
CO2: 27 meq/L (ref 19–32)
Chloride: 102 meq/L (ref 96–112)
GFR calc non Af Amer: 40 mL/min — ABNORMAL LOW (ref >90)
Potassium: 4.2 meq/L (ref 3.5–5.1)
Sodium: 137 meq/L (ref 135–145)
Total Bilirubin: 0.2 mg/dL — ABNORMAL LOW (ref 0.3–1.2)

## 2011-07-25 LAB — URINALYSIS, ROUTINE W REFLEX MICROSCOPIC
Bilirubin Urine: NEGATIVE
Nitrite: POSITIVE — AB
Protein, ur: >300 mg/dL — AB
Specific Gravity, Urine: 1.012 (ref 1.005–1.030)
Urobilinogen, UA: 0.2 mg/dL (ref 0.0–1.0)

## 2011-07-25 LAB — DIFFERENTIAL
Basophils Absolute: 0.1 10*3/uL (ref 0.0–0.1)
Basophils Relative: 1 % (ref 0–1)
Eosinophils Absolute: 0.2 K/uL (ref 0.0–0.7)
Eosinophils Relative: 3 % (ref 0–5)
Lymphocytes Relative: 26 % (ref 12–46)
Lymphs Abs: 2 10*3/uL (ref 0.7–4.0)
Monocytes Absolute: 0.5 K/uL (ref 0.1–1.0)
Monocytes Relative: 6 % (ref 3–12)
Neutro Abs: 4.9 10*3/uL (ref 1.7–7.7)
Neutrophils Relative %: 64 % (ref 43–77)

## 2011-07-25 LAB — COMPREHENSIVE METABOLIC PANEL
BUN: 20 mg/dL (ref 6–23)
Calcium: 9.1 mg/dL (ref 8.4–10.5)
Creatinine, Ser: 1.44 mg/dL — ABNORMAL HIGH (ref 0.50–1.10)
GFR calc Af Amer: 46 mL/min — ABNORMAL LOW (ref >90)
Glucose, Bld: 179 mg/dL — ABNORMAL HIGH (ref 70–99)
Total Protein: 7.5 g/dL (ref 6.0–8.3)

## 2011-07-25 LAB — CREATININE, URINE, RANDOM: Creatinine, Urine: 120.12 mg/dL

## 2011-07-25 LAB — URINE MICROSCOPIC-ADD ON

## 2011-07-25 LAB — LIPASE, BLOOD: Lipase: 15 U/L (ref 11–59)

## 2011-07-25 LAB — CK TOTAL AND CKMB (NOT AT ARMC)
CK, MB: 2.9 ng/mL (ref 0.3–4.0)
Total CK: 93 U/L (ref 7–177)

## 2011-07-25 LAB — PRO B NATRIURETIC PEPTIDE: Pro B Natriuretic peptide (BNP): 92.5 pg/mL (ref 0–125)

## 2011-07-26 ENCOUNTER — Inpatient Hospital Stay (HOSPITAL_COMMUNITY)

## 2011-07-26 ENCOUNTER — Other Ambulatory Visit (HOSPITAL_COMMUNITY)

## 2011-07-26 DIAGNOSIS — M79609 Pain in unspecified limb: Secondary | ICD-10-CM

## 2011-07-26 DIAGNOSIS — I517 Cardiomegaly: Secondary | ICD-10-CM

## 2011-07-26 DIAGNOSIS — M7989 Other specified soft tissue disorders: Secondary | ICD-10-CM

## 2011-07-26 LAB — DIFFERENTIAL
Basophils Absolute: 0.1 10*3/uL (ref 0.0–0.1)
Basophils Relative: 1 % (ref 0–1)
Eosinophils Absolute: 0.2 10*3/uL (ref 0.0–0.7)
Monocytes Absolute: 0.6 10*3/uL (ref 0.1–1.0)
Neutro Abs: 4.2 10*3/uL (ref 1.7–7.7)
Neutrophils Relative %: 54 % (ref 43–77)

## 2011-07-26 LAB — CBC
Hemoglobin: 9 g/dL — ABNORMAL LOW (ref 12.0–15.0)
MCHC: 32.7 g/dL (ref 30.0–36.0)
Platelets: 328 10*3/uL (ref 150–400)
RBC: 3.37 MIL/uL — ABNORMAL LOW (ref 3.87–5.11)

## 2011-07-26 LAB — TSH: TSH: 0.491 u[IU]/mL (ref 0.350–4.500)

## 2011-07-26 LAB — GLUCOSE, CAPILLARY
Glucose-Capillary: 97 mg/dL (ref 70–99)
Glucose-Capillary: 265 mg/dL — ABNORMAL HIGH (ref 70–99)
Glucose-Capillary: 120 mg/dL — ABNORMAL HIGH (ref 70–99)

## 2011-07-26 LAB — TROPONIN I
Troponin I: <0.3 ng/mL (ref <0.30)
Troponin I: <0.3 ng/mL (ref <0.30)

## 2011-07-26 LAB — FERRITIN: Ferritin: 121 ng/mL (ref 10–291)

## 2011-07-26 LAB — BASIC METABOLIC PANEL
Calcium: 8.5 mg/dL (ref 8.4–10.5)
Creatinine, Ser: 1.49 mg/dL — ABNORMAL HIGH (ref 0.50–1.10)
GFR calc non Af Amer: 38 mL/min — ABNORMAL LOW (ref >90)
Sodium: 140 mEq/L (ref 135–145)

## 2011-07-26 LAB — CK TOTAL AND CKMB (NOT AT ARMC)
CK, MB: 2.6 ng/mL (ref 0.3–4.0)
Relative Index: INVALID (ref 0.0–2.5)
Total CK: 83 U/L (ref 7–177)

## 2011-07-26 LAB — HEMOGLOBIN A1C
Hgb A1c MFr Bld: 8.7 % — ABNORMAL HIGH (ref <5.7)
Mean Plasma Glucose: 203 mg/dL — ABNORMAL HIGH (ref <117)

## 2011-07-26 LAB — VITAMIN B12: Vitamin B-12: 793 pg/mL (ref 211–911)

## 2011-07-26 MED ORDER — XENON XE 133 GAS
10.0000 | GAS_FOR_INHALATION | Freq: Once | RESPIRATORY_TRACT | Status: AC | PRN
  Administered 2011-07-26: 10 via RESPIRATORY_TRACT

## 2011-07-26 MED ORDER — TECHNETIUM TO 99M ALBUMIN AGGREGATED
6.0000 | Freq: Once | INTRAVENOUS | Status: AC | PRN
  Administered 2011-07-26: 6 via INTRAVENOUS

## 2011-07-27 LAB — CBC
HCT: 27.6 % — ABNORMAL LOW (ref 36.0–46.0)
Hemoglobin: 8.9 g/dL — ABNORMAL LOW (ref 12.0–15.0)
MCV: 81.2 fL (ref 78.0–100.0)
RBC: 3.4 MIL/uL — ABNORMAL LOW (ref 3.87–5.11)
WBC: 7.9 10*3/uL (ref 4.0–10.5)

## 2011-07-27 LAB — URINE CULTURE
Colony Count: >=100000
Culture  Setup Time: 201210311930

## 2011-07-27 LAB — BASIC METABOLIC PANEL
BUN: 24 mg/dL — ABNORMAL HIGH (ref 6–23)
CO2: 26 mEq/L (ref 19–32)
Chloride: 109 mEq/L (ref 96–112)
Creatinine, Ser: 1.58 mg/dL — ABNORMAL HIGH (ref 0.50–1.10)
GFR calc Af Amer: 41 mL/min — ABNORMAL LOW (ref >90)
Glucose, Bld: 134 mg/dL — ABNORMAL HIGH (ref 70–99)
Potassium: 3.9 mEq/L (ref 3.5–5.1)

## 2011-07-27 NOTE — Discharge Summary (Signed)
[name], [name]             ACCOUNT NO.:  [id number]  MEDICAL RECORD NO.:  [id number]  LOCATION:  [id number]                         FACILITY:  [facility]  PHYSICIAN:  Verneita Griffes, MD        DATE OF BIRTH:  04-11-1956  DATE OF ADMISSION:  07/25/2011 DATE OF DISCHARGE:  07/27/2011                              DISCHARGE SUMMARY   DISCHARGE DIAGNOSES: 1. Fatigue - likely multifactorial, causes would include:     a.     50-pounds weight gain.     b.     Possibly anemia.     c.     Possible diastolic dysfunction exacerbating this. 2. Diabetes mellitus with A1c at 8.7. 3. Noncardiogenic chest pain - ruled out with cardiac enzymes -     echocardiogram showing diastolic dysfunction. 4. Anemia of chronic disease with     a.     Sickle cell trait - hemoglobin stable at 9 in the hospital. 5. 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 6. Hypertension - suboptimally controlled -discontinued all     nephrotoxic agents. 7. Asymptomatic bacteriuria - status post 1 day of Rocephin.     Discontinued antibiotics. 8. Proteinuria - needs urinalysis as an outpatient.     a.     ? Hypothyroidism, had biopsy in the past.  No records      present at the present time.  DISCHARGE MEDICATIONS: 1. Tylenol PM 5/25 one tab at bedtime p.r.n. 2. Pravastatin 40 mg 1 tablet daily. 3. Novolin R 1-10 units b.i.d. per sliding scale. 4. Multivitamin over the counter 1 tab daily. 5. Enteric-coated aspirin 81 daily. 6. Flaxseed oil 2 teaspoons daily. 7. Calcium carbonate 1 tab daily. 8. Biotin 1 tab daily. 9. Folic acid 1 mg 2 tabs daily. 10.Vitamin C p.o. over-the-counter 1 tab daily. 11.Vitamin D over-the-counter 1 tab daily.  Changes to medications include: 1. I have discontinued her Diovan and her hydrochlorothiazide given     renal insufficiency. 2. I have decreased her dose of Lantus from 40 to 30 units at bedtime     given she had mild hyperglycemia while in hospital.  I have changed     her dose of amlodipine  from 5 to 10, given she was suboptimally     controlled on blood pressure, and I have also placed her on     metoprolol 50 b.i.d. for blood pressure control. 3. The patient has been placed on iron 325 mg b.i.d. with meals     despite the fact that she has anemia of chronic disease, which is     likely exacerbated by sickle cell trait.  PERTINENT IMAGING STUDIES: 1. Chest x-ray, 2 views, July 25, 2011.  Mild cardiomegaly and low     lung volumes.  No acute findings. 2. Pulmonary ventilation scan done July 26, 2011, showed normal     ventilation perfusion scan without defect to suggest pulmonary     embolism. 3. Echocardiogram done July 26, 2011, showed left ventricular     ejection fraction of 55-60% with grade 1 diastolic dysfunction and     left atrium is mildly dilated.  HISTORY:  This is a pleasant 55 year old African  American female who presented to [facility] with multitude of complaints.  Her main reason for coming to the emergency room was gleaned to be she has been having generalized weakness sounding like, which is more fatigued.  She stated that even doing regular household chores made her weak and she has trouble climbing up even 2 flights of stairs.  She says that this just general weakness.  She also says she has more fatigue.  She has no chest pain, no shortness of breath specifically.  No fecal incontinence, no urinary incontinence.  Denied any fever, dysuria polyuria, hematuria without intermittent diarrhea.  She denied any suprapubic pain. Initially during eval, her blood pressure was elevated at 193/92, and she has allergies to LISINOPRIL.  Other pertinent issues are:  The patient was like go off from her job about 1-1/2 years ago, and has gained 50 pounds since then.  She lives with her son and has had recent eye surgeries, which preclude her from getting around and driving.  Initial vitals showed a temperature is 97.9 blood pressure 193/92, pulse ox  100% on room air, respiratory rate of 18.  Heart sounds are regular. No murmurs.  Mild lower lumbar tenderness, paraspinal area.  Reflexes were all normal.  PERTINENT LABS ON ADMISSION: ProBNP was 92.5, WBC 7.7, hemoglobin 7.8, hematocrit of 31.8, platelet count 362, MCV was 81.7.  LFTs were grossly within normal limits.  BUN to creatinine ratio was 20:1.44. Point of care enzymes were negative. Urinalysis was done which was caught without a Foley.  HOSPITAL COURSE:  Fatigue.  This was thought to be multifactorial.  The patient states that she has not really been doing any physical activity and has gained 50 pounds since her last job.  She is legally blind and cannot drive and does not think she is depressed, but during hospitalization, she actually walked around and did well and felt almost close to her regular self.  An ESR was done, which was 24 and I think this is pretty nonspecific finding, which can be followed up as an outpatient as the patient does not have any family history of any rheumatological issues.  Does have significant anemia, which could also tire her out and echocardiogram did show diastolic dysfunction.  I havecounseled her significantly regarding increasing activity and weight loss, and the patient agrees to follow up with her [facility] physician for 1 more followup visit as she may want to change practices. 1. Diabetes mellitus.  A1c is 8.7.  The patient initially went     hypoglycemic on her home dose of Lantus down to the 50s and I have     to cut back her Lantus to 30 units.  The patient knows very well     how to titrate her insulin.  Her blood sugars on day of discharge     are 124 to 120, and she will need to have her A1c a little better     controlled and titer uncontrolled. 2. Chest pain.  This was ruled out by cardiac enzymes x3.     Echocardiogram as dictated above.  Echocardiogram did not     specifically mention elevated PA peak pressures, but with  weight     loss, I predict the patient will do well as an outpatient. 3. Anemia.  Anemia of chronic disease.  Iron studies were done.  Iron     was 41.  TIBC is 2.2, percent sat was 18, which is consistent with  the same.  She also has sickle trait.  She may need a CBC and     reticulocyte count in the 2-3 weeks versus getting done in about     maybe 3-4 days as she will also need to CMET per below problem. 4. CKD stage 1 to 2.  Her creatinine trends up slightly during     hospitalization.  However, she is still passing urine well and not     having any issues.  It was thought that her kidney injury may be     secondary to likely less p.o. fluid intake as well as ARB and     hydrochlorothiazide use and I have discontinued them both.  Please     note, the patient has a cough with lisinopril, and hence we are     limited to treat her with only beta-blockers, metoprolol, as well     as amlodipine for the time being.  Up titration of medications may     need to occur as an outpatient, and her pulse may support being on     75 mg metoprolol b.i.d. 5. Asymptomatic bacteriuria.  Her urine was not clean and catch.  She     received 1 dose of Rocephin.  She had no fever and no WBC elevation     on admit.  I discontinued her Rocephin and the patient's white     count never went up. 6. Proteinuria.  This will need to be reviewed as an outpatient as per     above. 7. Questionable marked hypothyroidism.  She had a TSH done this     hospitalization, which showed her labs was 0.491.  She is at lower     limit of normal and states she has had biopsies in the past.     Unfortunately, I am not able to comment on these and have no idea     as to what exactly biopsy was for.  She may need close followup in     the outpatient setting.  The patient was seen on day of discharge, was doing well.  Had no significant complaints or issues.  PHYSICAL EXAMINATION:  Temperature is 98.3, pulse 63, respirations  18, blood pressure 145-146/73-80, O2 sat is 97% on room air. CHEST:  Clear. HEART:  S1, S2.  No murmurs, rubs, or gallops. NECK:  Soft and supple.  Trachea midline. ABDOMEN:  Soft, nontender, nondistended. EXTREMITIES:  Soft.  No pitting edema.  The patient will be discharged today.  I have had extensive discussion with her and answered all of her questions.  She will follow up at [facility] on November 12 for the appointment that she already has, and it was pleasure taking care of her in the hospital.          ______________________________ Verneita Griffes, MD     JS/MEDQ  D:  07/27/2011  T:  07/27/2011  Job:  [id number]  Electronically Signed by Verneita Griffes MD on 07/27/2011 09:11:43 PM

## 2011-07-27 NOTE — H&P (Signed)
NAMEGIAMARIE, [name]             ACCOUNT NO.:  [id number]  MEDICAL RECORD NO.:  [id number]  LOCATION:  [facility]                         FACILITY:  [facility]  PHYSICIAN:  Oren Binet, MD    DATE OF BIRTH:  [birth year]  DATE OF ADMISSION:  07/25/2011 DATE OF DISCHARGE:                             HISTORY & PHYSICAL   PRIMARY CARE PRACTITIONER:  At the [facility].  CHIEF COMPLAINT:  Fatigue for a month, chest soreness for a week, and lower back pain for the past 3-5 days.  HISTORY OF PRESENT ILLNESS:  The patient is a 55 year old African American female, who has a past medical history of hypertension, diabetes, dyslipidemia, who presents to the ED with the above noted complaints.  Per the patient, she has multiple complaints, however, she claims that for the past 1 month or so, she has been having generalized weakness which sounds more like fatigue.  The patient claims that even doing her regular household chores make her fatigued.  She has a trouble walking 2 flight of stairs at times.  Again upon repeatedly asking if she feels weak in any part of her body, she claims it is just generalized weakness.  Upon asking whether she also has shortness of breath, she gives more of a fatigue history rather than shortness of breath.  The patient claims that for the past week or so the patient has been having chest soreness in the retrosternal area.  There is no radiation of this chest pain.  It is associated with occasional nausea but no vomiting.  Again, she denies any shortness of breath, but the history sounds more like a generalized fatigue.  There is no frank vomiting. The patient also claims that for the past few days she has been having lower back pain, which is not associated with fever, urinary incontinence, or fecal incontinence.  Sensation is intact.  She does not appear to have any deficits on exam.  She denies any ongoing lower extremity weakness but claims that she just  feels weak all over.  She denies any fever.  She denies any dysuria, polyuria, or hematuria. She has intermittent diarrhea.  She denies any suprapubic pain as well. She presented to the ED where she was evaluated and subsequently referred to the hospitalist service for further evaluation.  During evaluation in the ED, she was noted to have a blood pressure of 193/92.  ALLERGIES:  The patient claims she is allergic to LISINOPRIL which causes a cough.  PAST MEDICAL HISTORY:  Diabetic retinopathy.  PAST SURGICAL HISTORY: 1. She has had a hysterectomy in her 57s. 2. She had bunion surgery on right hand.  MEDICATIONS AT HOME:  Please note that these are unverified; however, she claims to be on Diovan, amlodipine, hydrochlorothiazide, Lipitor. She claims to be on 40 units of Lantus with regular sliding scale insulin regimen.  FAMILY HISTORY:  Her father had hypertension and heart disease. Her mother had diabetes.  SOCIAL HISTORY:  She is currently disabled and denies any toxic habits. She lives with her son.  REVIEW OF SYSTEMS:  A detailed review of 12 systems were done and these are negative except for the ones noted in the HPI.  PHYSICAL EXAMINATION:  VITAL SIGNS:  Initial vital signs shows a temperature of 97.9, heart rate of 75, respirations of 18, blood pressure 193/92, pulse ox of 100% on room air. GENERAL:  Awake, alert.  Speech clear.  Does not appear in any distress. HEENT:  Atraumatic, normocephalic.  Pupils equally reactive to light and accommodation. NECK:  Supple. CHEST:  Bilaterally clear to auscultation. CARDIOVASCULAR:  Heart sounds are regular.  No murmurs appreciated. ABDOMEN:  Soft, nontender, nondistended. EXTREMITIES:  She does have some mild swelling of her right lower extremity.  She claims this has been going on for 1 year.  However, she does not have any pitting edema. MUSCULOSKELETAL:  She does have some mild lower lumbar region tenderness in the  midline and the paraspinal area. NEUROLOGIC:  The patient is awake, alert x3.  Her speech is clear. Cranial nerves II through XII intact.  Motor strength is 5/5 in all of her 4 extremities.  Her reflexes are 2+ and her plantars are downgoing. She does not appear to have any sensory deficits in her thigh areas.  LABORATORY DATA: 1. CBC shows a WBC of 7.7, hemoglobin of 7.8, hematocrit of 31.8, and     a platelet count of 362.  Her MCV is 81.7. 2. ProBNP is 92.5. 3. Chemistry shows sodium of 137, potassium of 4.2, chloride of 102,     bicarb of 27, BUN of 20, creatinine of 1.44, and a glucose of 179. 4. LFT shows a total bilirubin of 0.2, alkaline phosphatase of 96, AST     of 15, ALT of 13, total protein of 7.5, albumin of 3.4. 5. Calcium is 9.1. 6. Lipase is 16. 7. Point-of-care cardiac enzymes are negative. 8. Urinalyses shows a protein of more than 300 with nitrite positive,     large leukocytes, and a microscopic analysis shows too numerous to     count wbc's with many bacteria.  RADIOLOGICAL STUDIES:  X-ray of the chest shows mild cardiomegaly and low-lung volumes.  No acute findings.  ASSESSMENT: 1. Fatigue and generalized weakness going on for a month.  I do not     know how much of this is related to urinary tract infection.  She     also does have some significant amount of anemia contributing.  She     claims she is always anemic; however, review of her labs does show     that she had a hemoglobin of in the 9s range in May 2012.  We will     check a TSH level to make sure she does not have derangements there     as well. 2. Chest pain.  This seems to be mostly atypical to me as the patient     claims that this is mostly soreness.  She does however have     diabetes, hypertension, dyslipidemia.  All significant risk     factors, and cardiac enzymes will be cycled.  She does not appear     tachycardic or hypoxic.  She however does have some swelling on the     right lower  extremity, and we will check a D-dimer on her.  The     emergency department physician has already ordered 1 dose of     empiric Lovenox.  This will be discontinued if her D-dimer is     negative.  If her D-dimer is positive, she will either need a CT     angiogram or a V/Q scan depending  on renal function. 3. Anemia.  This seems to be developing over the past couple of years.     The etiology is unknown at this point in time. 4. Mild renal insufficiency.  She is on hydrochlorothiazide and Diovan     which may be contributing, however.  She does appear to have more     than 300 proteinuria in her urinalysis.  I do not know if this is     underlying diabetic nephropathy or this is transient proteinuria     from her a urinary tract infection. 5. Back pain.  This looks like more musculoskeletal in nature.  She     does not have any worrisome features to suggest fever or urinary     incontinence or fecal incontinence.  Her neurological examination     is very benign as well. 6. History of diabetes. 7. Uncontrolled hypertension. 8. History of dyslipidemia.  PLAN: 1. The patient will be admitted to a telemetry unit. 2. In regard to her UTI, we will start her on Rocephin and send a     urine culture out for culture and sensitivity. 3. In regard to her fatigue, we will check a TSH, check an anemia     panel.  If her fatigue improves with treatment for the UTI, perhaps     then this is all from her UTI. 4. In regard to her chest pain, I personally think this is mostly     atypical in nature; however, she does have a significant risk     factors and as a result, cardiac enzymes will be cycled.  The     patient does give a history of generalized weakness but is not     short of breath or hypoxic.  Her O2 sat during my evaluation was in     the high 90s just on room air.  However, she does have right lower     lobe swelling although the clinical likelihood of her having     pulmonary embolism  is low.  I will go ahead and check a D-dimer; if     that is positive, then we will pursue further testing depending on     her renal function. 5. Uncontrolled hypertension.  The patient is not sure of the dosing     of the medication she takes.  For now, I will place her on     Lopressor and amlodipine and provide her with as-needed hydralazine     and labetalol for a systolic blood pressure more than 170.  Once     her dosing has been confined and if her renal function permits, we     will resume her usual antihypertensive medications. 6. In regard to her back pain, I think this is mostly musculoskeletal     and she does not appear to have any red flags in her history and     physical exam.  She is intact neurologically.  For now, we will     just obtain plain films and follow clinical course. 7. In regard to her diabetes, we will resume her Lantus which she     claims she takes around 40 units at night and place her on a     sliding scale regimen.  Further optimization will be done as a     clinical course evolves. 8. In regard to her nausea, we will place her on as-needed Zofran and     Phenergan and start  on a medium carb modified diet.  If she were to     have vomiting, then we will may be downgrade it to a full liquid     diet and take it from there. 9. DVT prophylaxis with Lovenox.  CODE STATUS:  Full code.  TOTAL TIME SPENT:  For admission equals 45 minutes.     Oren Binet, MD     SG/MEDQ  D:  07/25/2011  T:  07/25/2011  Job:  [id number]  cc:   [facility]  Electronically Signed by Oren Binet  on 07/27/2011 02:10:56 AM

## 2011-09-04 IMAGING — US US SOFT TISSUE HEAD/NECK
1 series · 14 of 25 positions shown · non-contrast
Comparison: Thyroid ultrasound 11/21/2006.

CLINICAL DATA: Right neck mass.

THYROID ULTRASOUND
TECHNIQUE: Ultrasound examination of the thyroid gland and adjacent
soft tissues was performed.

[Series 1: us soft tissue head/neck · 0.08mm/px · 14 of 80 slices shown]
[im 1/80]
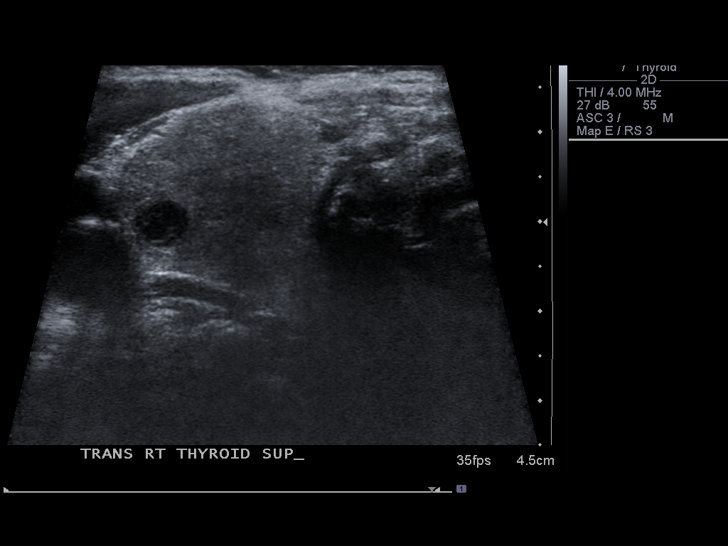
[im 7/80]
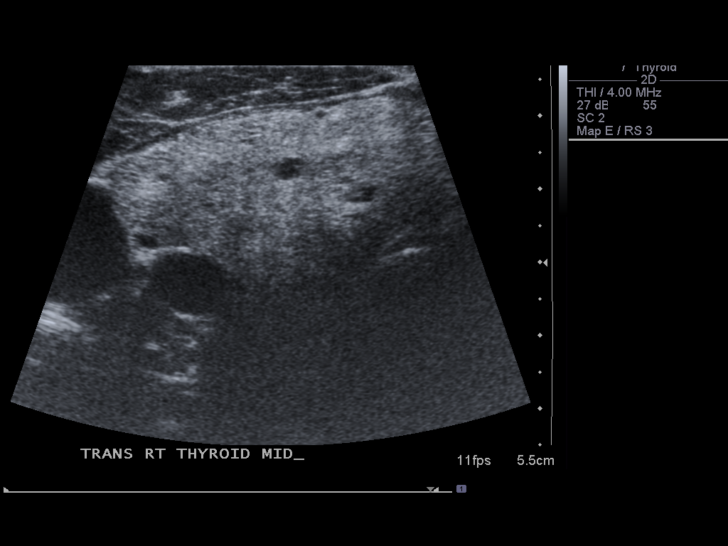
[im 14/80]
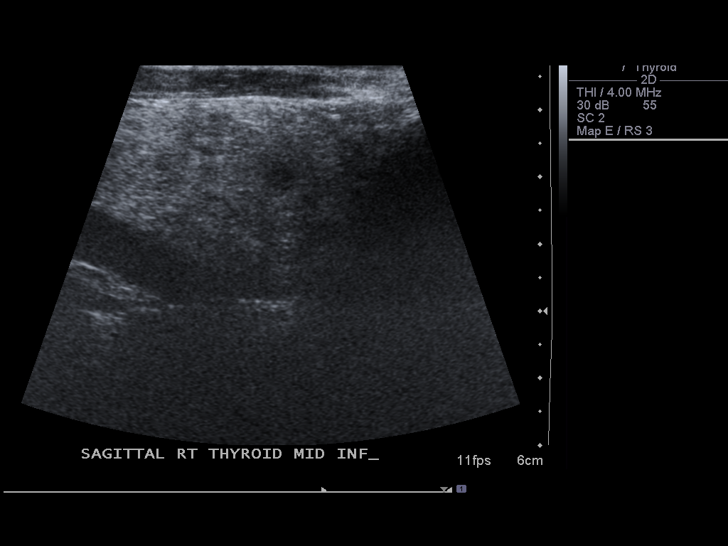
[im 20/80]
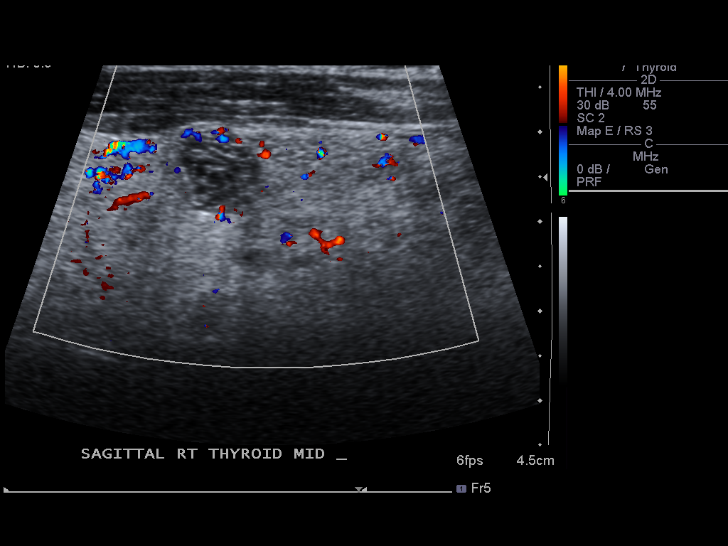
[im 27/80]
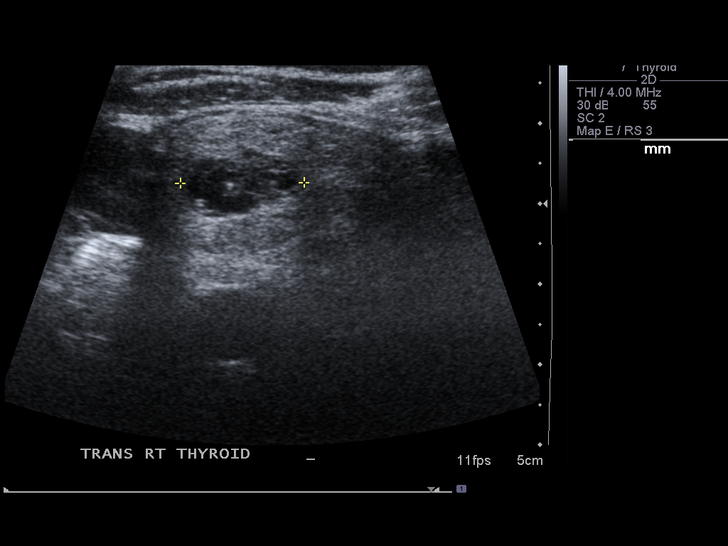
[im 30/80]
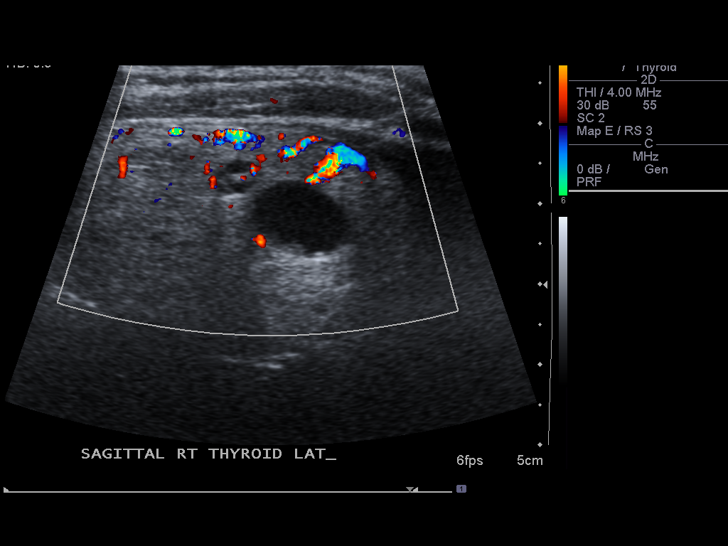
[im 37/80]
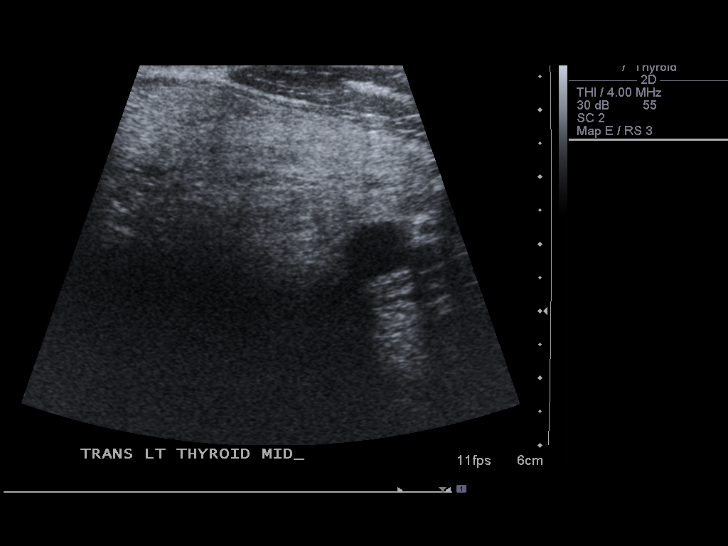
[im 43/80]
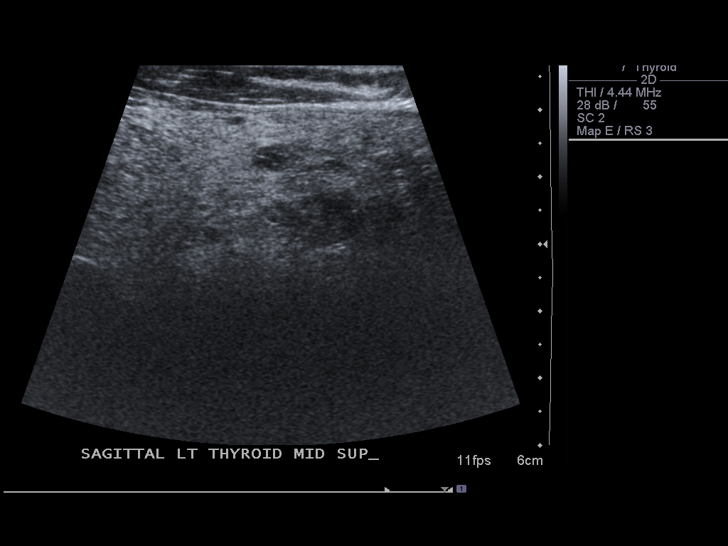
[im 50/80]
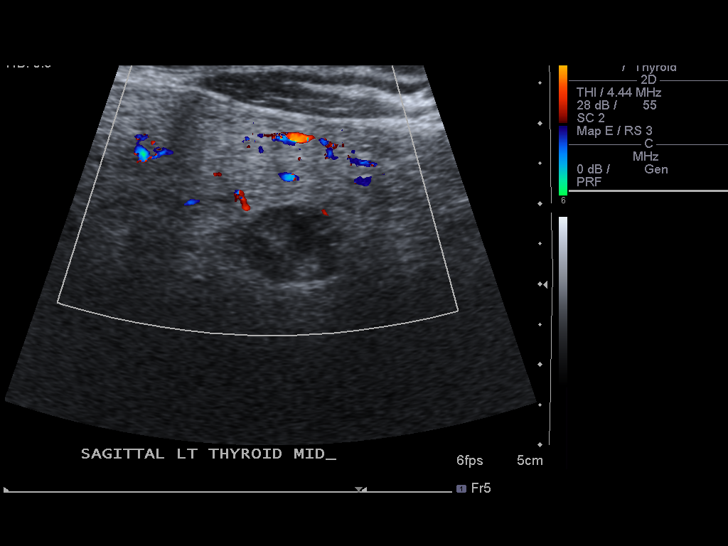
[im 53/80]
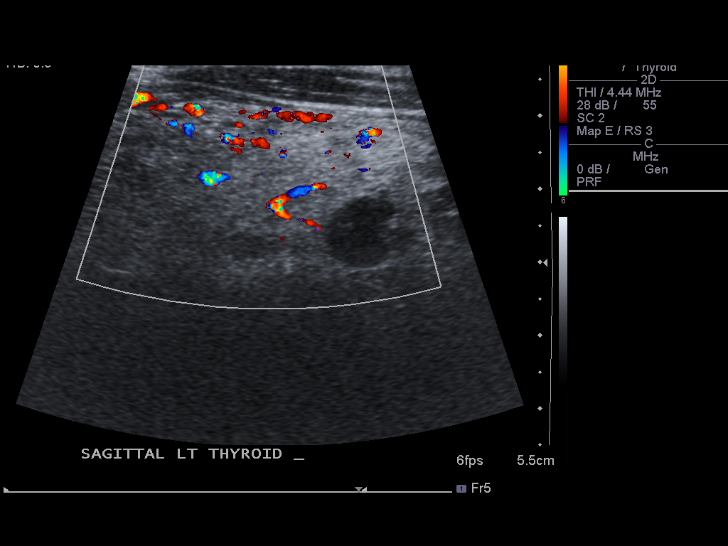
[im 60/80]
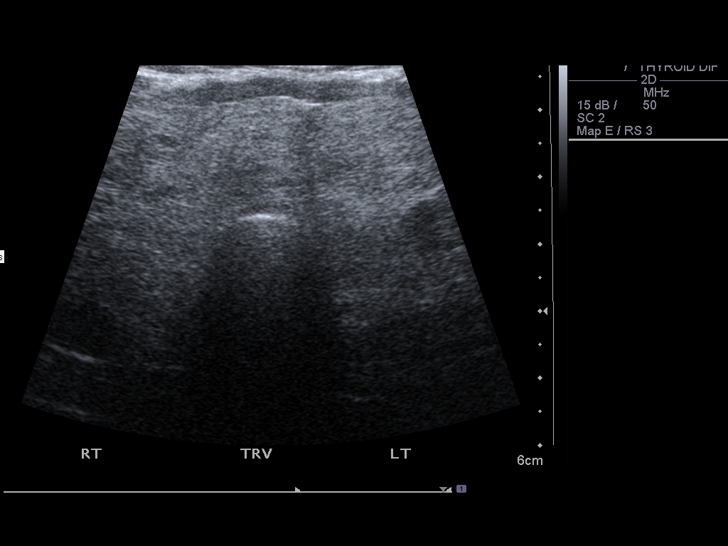
[im 66/80]
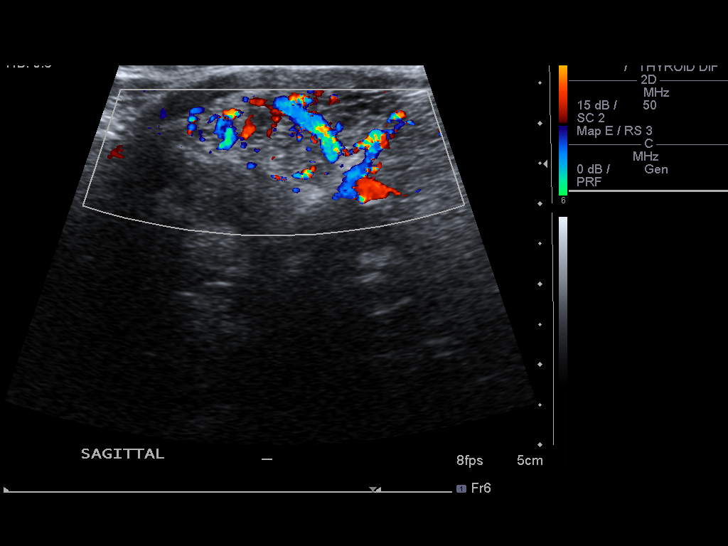
[im 73/80]
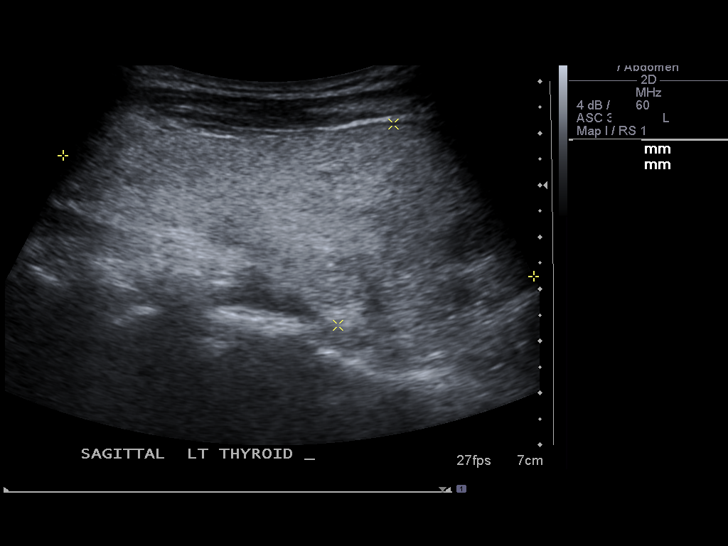
[im 80/80]
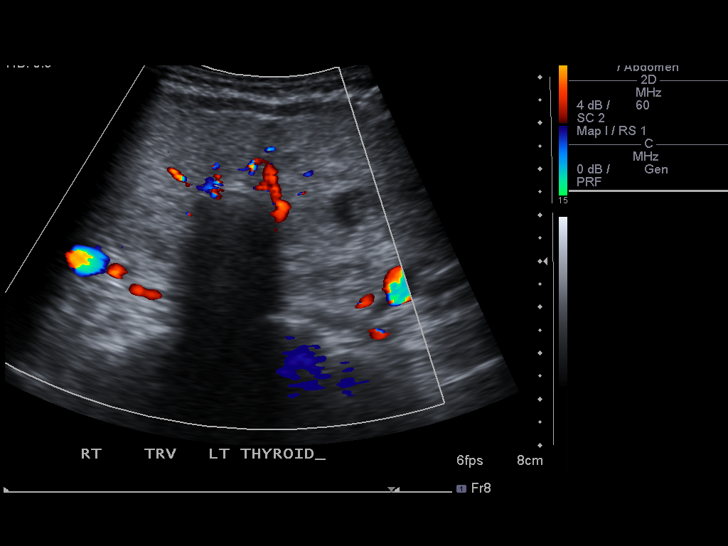

[14 of 25 positions shown; findings below may reference images not displayed]

FINDINGS: Right thyroid lobe:  Measures 9.1 x 3.2 x 3.7 cm and demonstrates
diffuse heterogeneous echogenicity.
Left thyroid lobe:  Measures 9.4 x 4.0 x 4.3 cm and demonstrates
diffuse heterogeneous echogenicity.
Isthmus:  Measures 1.8 cm in thickness and is heterogeneous.

Focal nodules:  Multiple small nodules are identified.  The largest
is in the left aspect of the isthmus measuring 1.9 x 0.8 x 1.2 cm
and appears unchanged.  1.5 cm cystic nodule with debris in the
lower pole left lobe is also noted.  Three focal nodules identified
in the right lobe of the largest measuring 1.3 cm in maximal
diameter.  No microcalcifications are identified. No new or
enlarging nodule is seen.

Lymphadenopathy:  None visualized.
IMPRESSION: Findings consistent with multinodular goiter.

## 2011-10-01 ENCOUNTER — Other Ambulatory Visit (HOSPITAL_COMMUNITY): Payer: Self-pay | Admitting: Family Medicine

## 2011-10-01 DIAGNOSIS — R05 Cough: Secondary | ICD-10-CM

## 2011-10-01 DIAGNOSIS — R059 Cough, unspecified: Secondary | ICD-10-CM

## 2011-10-03 ENCOUNTER — Inpatient Hospital Stay (HOSPITAL_COMMUNITY): Admission: RE | Admit: 2011-10-03 | Source: Ambulatory Visit

## 2011-10-04 ENCOUNTER — Other Ambulatory Visit (HOSPITAL_COMMUNITY): Payer: Self-pay | Admitting: Family Medicine

## 2011-10-04 DIAGNOSIS — E049 Nontoxic goiter, unspecified: Secondary | ICD-10-CM

## 2011-10-08 ENCOUNTER — Ambulatory Visit (HOSPITAL_COMMUNITY)
Admission: RE | Admit: 2011-10-08 | Discharge: 2011-10-08 | Disposition: A | Discharge status: Home / Self Care | Payer: Self-pay | Source: Ambulatory Visit | Attending: Family Medicine | Admitting: Family Medicine

## 2011-10-08 DIAGNOSIS — E049 Nontoxic goiter, unspecified: Secondary | ICD-10-CM | POA: Insufficient documentation

## 2011-10-09 ENCOUNTER — Emergency Department (HOSPITAL_COMMUNITY)
Admission: EM | Admit: 2011-10-09 | Discharge: 2011-10-10 | Disposition: A | Discharge status: Home / Self Care | Payer: Self-pay | Attending: Emergency Medicine | Admitting: Emergency Medicine

## 2011-10-09 ENCOUNTER — Emergency Department (HOSPITAL_COMMUNITY): Payer: Self-pay

## 2011-10-09 ENCOUNTER — Encounter (HOSPITAL_COMMUNITY): Payer: Self-pay | Admitting: *Deleted

## 2011-10-09 DIAGNOSIS — Z7982 Long term (current) use of aspirin: Secondary | ICD-10-CM | POA: Insufficient documentation

## 2011-10-09 DIAGNOSIS — R51 Headache: Secondary | ICD-10-CM | POA: Insufficient documentation

## 2011-10-09 DIAGNOSIS — E049 Nontoxic goiter, unspecified: Secondary | ICD-10-CM | POA: Insufficient documentation

## 2011-10-09 DIAGNOSIS — Z79899 Other long term (current) drug therapy: Secondary | ICD-10-CM | POA: Insufficient documentation

## 2011-10-09 DIAGNOSIS — I10 Essential (primary) hypertension: Secondary | ICD-10-CM | POA: Insufficient documentation

## 2011-10-09 DIAGNOSIS — E119 Type 2 diabetes mellitus without complications: Secondary | ICD-10-CM | POA: Insufficient documentation

## 2011-10-09 HISTORY — DX: Essential (primary) hypertension: I10

## 2011-10-09 LAB — CBC
HCT: 33.9 % — ABNORMAL LOW (ref 36.0–46.0)
MCV: 80.7 fL (ref 78.0–100.0)
Platelets: 406 10*3/uL — ABNORMAL HIGH (ref 150–400)
RBC: 4.2 MIL/uL (ref 3.87–5.11)
WBC: 10.9 10*3/uL — ABNORMAL HIGH (ref 4.0–10.5)

## 2011-10-09 LAB — COMPREHENSIVE METABOLIC PANEL
ALT: 24 U/L (ref 0–35)
AST: 26 U/L (ref 0–37)
Alkaline Phosphatase: 100 U/L (ref 39–117)
CO2: 28 mEq/L (ref 19–32)
Calcium: 9.2 mg/dL (ref 8.4–10.5)
Chloride: 101 mEq/L (ref 96–112)
GFR calc Af Amer: 39 mL/min — ABNORMAL LOW (ref >90)
GFR calc non Af Amer: 33 mL/min — ABNORMAL LOW (ref >90)
Glucose, Bld: 257 mg/dL — ABNORMAL HIGH (ref 70–99)
Sodium: 138 mEq/L (ref 135–145)
Total Bilirubin: 0.2 mg/dL — ABNORMAL LOW (ref 0.3–1.2)

## 2011-10-09 LAB — URINALYSIS, ROUTINE W REFLEX MICROSCOPIC
Bilirubin Urine: NEGATIVE
Ketones, ur: NEGATIVE mg/dL
Nitrite: NEGATIVE
Protein, ur: >300 mg/dL — AB
Specific Gravity, Urine: 1.015 (ref 1.005–1.030)
Urobilinogen, UA: 0.2 mg/dL (ref 0.0–1.0)

## 2011-10-09 LAB — GLUCOSE, CAPILLARY

## 2011-10-09 LAB — PROTIME-INR
INR: 0.97 (ref 0.00–1.49)
Prothrombin Time: 13.1 seconds (ref 11.6–15.2)

## 2011-10-09 LAB — DIFFERENTIAL
Basophils Absolute: 0.1 10*3/uL (ref 0.0–0.1)
Eosinophils Relative: 3 % (ref 0–5)
Lymphocytes Relative: 21 % (ref 12–46)
Lymphs Abs: 2.3 10*3/uL (ref 0.7–4.0)
Monocytes Absolute: 0.7 10*3/uL (ref 0.1–1.0)
Neutro Abs: 7.6 10*3/uL (ref 1.7–7.7)

## 2011-10-09 LAB — URINE MICROSCOPIC-ADD ON

## 2011-10-09 LAB — URINE CULTURE: Culture: NO GROWTH

## 2011-10-09 LAB — APTT: aPTT: 29 seconds (ref 24–37)

## 2011-10-09 MED ORDER — CLONIDINE HCL 0.1 MG PO TABS
0.2000 mg | ORAL_TABLET | Freq: Once | ORAL | Status: AC
  Administered 2011-10-09: 0.2 mg via ORAL
  Filled 2011-10-09: qty 2

## 2011-10-09 NOTE — ED Notes (Signed)
She is c/o a headache and pressure behind her eyes since this am. She is afraid that she is having a stroke. Some nausea.  She is a diabetic

## 2011-10-09 NOTE — ED Notes (Signed)
Glucose-[id number]

## 2011-10-09 NOTE — ED Provider Notes (Cosign Needed)
History     CSN: [id number]  Arrival date & time 10/09/11  1733   First MD Initiated Contact with Patient 10/09/11 1923      Chief Complaint  Patient presents with  . Headache    (Consider location/radiation/quality/duration/timing/severity/associated sxs/prior treatment) HPI Comments: Patient is a 56 year old woman who has diabetes and hypertension. She woke this morning at 5 and had a headache. It's a dull aching pain in her head with pressure behind her eyes. She also notes that if she touches the right side of her neck pain will go up into the right side of her head. Her blood pressure was high. She took ibuprofen without relief. Over the pain it persisted throughout the day she sought evaluation.  Patient is a 56 y.o. female presenting with headaches. The history is provided by the patient and medical records. No language interpreter was used.  Headache  The current episode started 12 to 24 hours ago. The problem occurs constantly. The problem has not changed since onset.The headache is associated with nothing. Pain location: Pain is felt from the right side of her neck up the right side of her head behind her eyes. The quality of the pain is described as dull.    Past Medical History  Diagnosis Date  . Hypertension   . Diabetes mellitus     History reviewed. No pertinent past surgical history.  History reviewed. No pertinent family history.  History  Substance Use Topics  . Smoking status: Never Smoker   . Smokeless tobacco: Not on file  . Alcohol Use: No    OB History    Grav Para Term Preterm Abortions TAB SAB Ect Mult Living                  Review of Systems  Neurological: Positive for headaches.    Allergies  Lisinopril  Home Medications   Current Outpatient Rx  Name Route Sig Dispense Refill  . AMLODIPINE BESYLATE 10 MG PO TABS Oral Take 10 mg by mouth daily.    . ASPIRIN EC 81 MG PO TBEC Oral Take 81 mg by mouth daily.    Marland Kitchen HYDROCHLOROTHIAZIDE 25  MG PO TABS Oral Take 25 mg by mouth daily.    Marland Kitchen METOPROLOL TARTRATE 50 MG PO TABS Oral Take 50 mg by mouth 2 (two) times daily.    Marland Kitchen NITROFURANTOIN MACROCRYSTAL 100 MG PO CAPS Oral Take 100 mg by mouth every 6 (six) hours.      BP 172/77  Pulse 69  Temp(Src) 97.7 F (36.5 C) (Oral)  Resp 18  SpO2 98%  Physical Exam  ED Course  Procedures (including critical care time)  Labs Reviewed  GLUCOSE, CAPILLARY - Abnormal; Notable for the following:    Glucose-Capillary 284 (*)    All other components within normal limits  CBC  DIFFERENTIAL  COMPREHENSIVE METABOLIC PANEL  URINALYSIS, ROUTINE W REFLEX MICROSCOPIC  URINE CULTURE  PROTIME-INR  APTT   US Soft Tissue Head/neck  10/08/2011  *RADIOLOGY REPORT*  Clinical Data: Thyroid goiter.  THYROID ULTRASOUND  Technique: Ultrasound examination of the thyroid gland and adjacent soft tissues was performed.  Comparison:  08/15/2010.  Findings:  Right thyroid lobe:  Measures 9.1 x 3.1 x 3.7 cm Left thyroid lobe:  Measures 9.4 x 3.0 x 4.4 cm. Isthmus:  Measures 1.8 cm.  Diffusely heterogeneous thyroid echotexture.  Focal nodules:  There are multiple bilateral thyroid nodules.  The largest lesion in the right lobe measures 1.3 x 1.0 x  1.1 cm. This is stable.  The largest lesion in the left lobe measures 1.4 x 0.8 x 0.9 cm. This is stable.  The largest lesion is located in the isthmus measures 2.1 x 1.5 x 2.1 cm.  This is increased in size since the prior ultrasound examination.  On the previous study measured 1.9 x 0.8 x 1.2 cm. Recommend fine needle aspiration/biopsy.  Lymphadenopathy:  None visualized.  IMPRESSION:  1.  Markedly enlarged multinodular thyroid goiter. 2.  Interval enlargement of the isthmic nodule now measuring 2.1 x 1.5 x 2.1 cm.  Recommend fine-needle aspiration/biopsy.  Original Report Authenticated By: P. Kalman Jewels, M.D.    Date: 10/09/2011  Rate: 67  Rhythm: normal sinus rhythm  QRS Axis: normal  Intervals: normal QRS:   Poor R. wave progression in precordial leads suggests possible old anterior myocardial infarction.  ST/T Wave abnormalities: normal  Conduction Disutrbances:none  Narrative Interpretation: Abnormal EKG  Old EKG Reviewed: unchanged  12:46 AM Results for orders placed during the hospital encounter of 10/09/11  GLUCOSE, CAPILLARY      Component Value Range   Glucose-Capillary 284 (*) 70 - 99 (mg/dL)   Comment 1 Documented in Chart    CBC      Component Value Range   WBC 10.9 (*) 4.0 - 10.5 (K/uL)   RBC 4.20  3.87 - 5.11 (MIL/uL)   Hemoglobin 11.2 (*) 12.0 - 15.0 (g/dL)   HCT 33.9 (*) 36.0 - 46.0 (%)   MCV 80.7  78.0 - 100.0 (fL)   MCH 26.7  26.0 - 34.0 (pg)   MCHC 33.0  30.0 - 36.0 (g/dL)   RDW 13.7  11.5 - 15.5 (%)   Platelets 406 (*) 150 - 400 (K/uL)  DIFFERENTIAL      Component Value Range   Neutrophils Relative 69  43 - 77 (%)   Neutro Abs 7.6  1.7 - 7.7 (K/uL)   Lymphocytes Relative 21  12 - 46 (%)   Lymphs Abs 2.3  0.7 - 4.0 (K/uL)   Monocytes Relative 6  3 - 12 (%)   Monocytes Absolute 0.7  0.1 - 1.0 (K/uL)   Eosinophils Relative 3  0 - 5 (%)   Eosinophils Absolute 0.3  0.0 - 0.7 (K/uL)   Basophils Relative 1  0 - 1 (%)   Basophils Absolute 0.1  0.0 - 0.1 (K/uL)  COMPREHENSIVE METABOLIC PANEL      Component Value Range   Sodium 138  135 - 145 (mEq/L)   Potassium 4.3  3.5 - 5.1 (mEq/L)   Chloride 101  96 - 112 (mEq/L)   CO2 28  19 - 32 (mEq/L)   Glucose, Bld 257 (*) 70 - 99 (mg/dL)   BUN 32 (*) 6 - 23 (mg/dL)   Creatinine, Ser 1.67 (*) 0.50 - 1.10 (mg/dL)   Calcium 9.2  8.4 - 10.5 (mg/dL)   Total Protein 7.6  6.0 - 8.3 (g/dL)   Albumin 3.3 (*) 3.5 - 5.2 (g/dL)   AST 26  0 - 37 (U/L)   ALT 24  0 - 35 (U/L)   Alkaline Phosphatase 100  39 - 117 (U/L)   Total Bilirubin 0.2 (*) 0.3 - 1.2 (mg/dL)   GFR calc non Af Amer 33 (*) >90 (mL/min)   GFR calc Af Amer 39 (*) >90 (mL/min)  URINALYSIS, ROUTINE W REFLEX MICROSCOPIC      Component Value Range   Color, Urine  YELLOW  YELLOW    APPearance CLEAR  CLEAR  Specific Gravity, Urine 1.015  1.005 - 1.030    pH 6.0  5.0 - 8.0    Glucose, UA 500 (*) NEGATIVE (mg/dL)   Hgb urine dipstick SMALL (*) NEGATIVE    Bilirubin Urine NEGATIVE  NEGATIVE    Ketones, ur NEGATIVE  NEGATIVE (mg/dL)   Protein, ur >300 (*) NEGATIVE (mg/dL)   Urobilinogen, UA 0.2  0.0 - 1.0 (mg/dL)   Nitrite NEGATIVE  NEGATIVE    Leukocytes, UA NEGATIVE  NEGATIVE   PROTIME-INR      Component Value Range   Prothrombin Time 13.1  11.6 - 15.2 (seconds)   INR 0.97  0.00 - 1.49   APTT      Component Value Range   aPTT 29  24 - 37 (seconds)  URINE MICROSCOPIC-ADD ON      Component Value Range   Squamous Epithelial / LPF RARE  RARE    RBC / HPF 3-6  <3 (RBC/hpf)   Bacteria, UA RARE  RARE    Casts GRANULAR CAST (*) NEGATIVE    Dg Chest 2 View  10/09/2011  *RADIOLOGY REPORT*  Clinical Data: Hypertension, shortness of breath  CHEST - 2 VIEW  Comparison: 07/25/2011  Findings: Mild cardiac enlargement with decreased lung volumes. Slight central bronchial thickening.  No definite pneumonia, edema, collapse, consolidation, effusion or pneumothorax.  Trachea midline.  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  Negative for CHF or pneumonia.  Original Report Authenticated By: Jerilynn Mages. Daryll Brod, M.D.   Ct Head Wo Contrast  10/09/2011  *RADIOLOGY REPORT*  Clinical Data:  Persistent headache; right-sided neck pain. Hypertension.  History of diabetes.  CT HEAD WITHOUT CONTRAST AND CT CERVICAL SPINE WITHOUT CONTRAST  Technique:  Multidetector CT imaging of the head and cervical spine was performed following the standard protocol without intravenous contrast.  Multiplanar CT image reconstructions of the cervical spine were also generated.  Comparison: None  CT HEAD  Findings: There is no evidence of acute  infarction, mass lesion, or intra- or extra-axial hemorrhage on CT.  Scattered periventricular and subcortical white matter change likely reflects small vessel ischemic microangiopathy.  Prominence of the sulci suggests mild cortical volume loss.  The posterior fossa, including the cerebellum, brainstem and fourth ventricle, is within normal limits.  The third and lateral ventricles, and basal ganglia are unremarkable in appearance.  The cerebral hemispheres demonstrate grossly normal gray-white differentiation.  No mass effect or midline shift is seen.  There is no evidence of fracture; visualized osseous structures are unremarkable in appearance.  The visualized portions of the orbits are within normal limits.  The paranasal sinuses and mastoid air cells are well-aerated.  No significant soft tissue abnormalities are seen.  IMPRESSION:  1.  No acute intracranial pathology seen on CT. 2.  Scattered small vessel ischemic microangiopathy and mild cortical volume loss.  CT CERVICAL SPINE  Findings: There is no evidence of fracture or subluxation. Vertebral bodies demonstrate normal height and alignment. Intervertebral disc spaces are preserved.  Prevertebral soft tissues are within normal limits.  Anterior and posterior disc osteophyte complexes are noted at C5-C6 and C6-C7.  The thyroid gland is diffusely enlarged, with scattered mildly complex nodules noted in the thyroid gland. This reflects thyroid goiter, as previously noted; as noted on the recent thyroid ultrasound, the isthmic nodule would benefit from fine needle aspiration/biopsy.  The visualized lung apices are grossly clear.  No significant soft tissue abnormalities are seen.  IMPRESSION:  1.  No evidence of fracture or subluxation along the cervical spine. 2.  Mild degenerative change at the lower cervical spine. 3.  Thyroid goiter again noted.  As characterized on recent thyroid ultrasound, the enlarging isthmic nodule would benefit from fine needle  aspiration/biopsy, as deemed clinically appropriate.  Original Report Authenticated By: Santa Lighter, M.D.   Ct Cervical Spine Wo Contrast  10/09/2011  *RADIOLOGY REPORT*  Clinical Data:  Persistent headache; right-sided neck pain. Hypertension.  History of diabetes.  CT HEAD WITHOUT CONTRAST AND CT CERVICAL SPINE WITHOUT CONTRAST  Technique:  Multidetector CT imaging of the head and cervical spine was performed following the standard protocol without intravenous contrast.  Multiplanar CT image reconstructions of the cervical spine were also generated.  Comparison: None  CT HEAD  Findings: There is no evidence of acute infarction, mass lesion, or intra- or extra-axial hemorrhage on CT.  Scattered periventricular and subcortical white matter change likely reflects small vessel ischemic microangiopathy.  Prominence of the sulci suggests mild cortical volume loss.  The posterior fossa, including the cerebellum, brainstem and fourth ventricle, is within normal limits.  The third and lateral ventricles, and basal ganglia are unremarkable in appearance.  The cerebral hemispheres demonstrate grossly normal gray-white differentiation.  No mass effect or midline shift is seen.  There is no evidence of fracture; visualized osseous structures are unremarkable in appearance.  The visualized portions of the orbits are within normal limits.  The paranasal sinuses and mastoid air cells are well-aerated.  No significant soft tissue abnormalities are seen.  IMPRESSION:  1.  No acute intracranial pathology seen on CT. 2.  Scattered small vessel ischemic microangiopathy and mild cortical volume loss.  CT CERVICAL SPINE  Findings: There is no evidence of fracture or subluxation. Vertebral bodies demonstrate normal height and alignment. Intervertebral disc spaces are preserved.  Prevertebral soft tissues are within normal limits.  Anterior and posterior disc osteophyte complexes are noted at C5-C6 and C6-C7.  The thyroid gland is  diffusely enlarged, with scattered mildly complex nodules noted in the thyroid gland. This reflects thyroid goiter, as previously noted; as noted on the recent thyroid ultrasound, the isthmic nodule would benefit from fine needle aspiration/biopsy.  The visualized lung apices are grossly clear. No significant soft tissue abnormalities are seen.  IMPRESSION:  1.  No evidence of fracture or subluxation along the cervical spine. 2.  Mild degenerative change at the lower cervical spine. 3.  Thyroid goiter again noted.  As characterized on recent thyroid ultrasound, the enlarging isthmic nodule would benefit from fine needle aspiration/biopsy, as deemed clinically appropriate.  Original Report Authenticated By: Santa Lighter, M.D.   US Soft Tissue Head/neck  10/08/2011  *RADIOLOGY REPORT*  Clinical Data: Thyroid goiter.  THYROID ULTRASOUND  Technique: Ultrasound examination of the thyroid gland and adjacent soft tissues was performed.  Comparison:  08/15/2010.  Findings:  Right thyroid lobe:  Measures 9.1 x 3.1 x 3.7 cm Left thyroid lobe:  Measures 9.4 x 3.0 x 4.4 cm. Isthmus:  Measures 1.8 cm.  Diffusely heterogeneous thyroid echotexture.  Focal nodules:  There are multiple bilateral thyroid nodules.  The largest lesion in the right lobe measures 1.3 x 1.0 x 1.1 cm. This is stable.  The largest lesion in the left lobe measures 1.4 x 0.8 x 0.9 cm. This is stable.  The largest lesion is located in the isthmus measures 2.1 x 1.5 x 2.1 cm.  This is increased  in size since the prior ultrasound examination.  On the previous study measured 1.9 x 0.8 x 1.2 cm. Recommend fine needle aspiration/biopsy.  Lymphadenopathy:  None visualized.  IMPRESSION:  1.  Markedly enlarged multinodular thyroid goiter. 2.  Interval enlargement of the isthmic nodule now measuring 2.1 x 1.5 x 2.1 cm.  Recommend fine-needle aspiration/biopsy.  Original Report Authenticated By: P. Kalman Jewels, M.D.    Lab tests showed elevated blood sugar and  mild renal insufficiency.  CT of head showed no evidence of stroke or hemorrhage.  CT of neck showed a goiter and mild degenerative changes.  Recommend symptomatic Rx for headache, pt to continue her medications for diabetes and hypertension, followup with Delman Cheadle, M.D. For her headache and to see if thyroid biopsy is needed.  1. Headache   2. Diabetes mellitus   3. Hypertension   4. Croft[place] III, MD 10/10/11 [phone]

## 2011-10-10 LAB — GLUCOSE, CAPILLARY: Glucose-Capillary: 191 mg/dL — ABNORMAL HIGH (ref 70–99)

## 2011-10-10 MED ORDER — HYDROCODONE-ACETAMINOPHEN 5-325 MG PO TABS: 1.0000 | ORAL_TABLET | ORAL | Status: AC | PRN

## 2011-10-10 MED ORDER — DIPHENHYDRAMINE HCL 50 MG/ML IJ SOLN
12.5000 mg | Freq: Once | INTRAMUSCULAR | Status: AC
  Administered 2011-10-10: 12.5 mg via INTRAVENOUS
  Filled 2011-10-10: qty 1

## 2011-10-10 MED ORDER — METOCLOPRAMIDE HCL 5 MG/ML IJ SOLN
10.0000 mg | Freq: Once | INTRAMUSCULAR | Status: AC
  Administered 2011-10-10: 10 mg via INTRAVENOUS
  Filled 2011-10-10: qty 2

## 2011-10-10 NOTE — ED Notes (Signed)
Glucose 191

## 2011-10-11 ENCOUNTER — Encounter (INDEPENDENT_AMBULATORY_CARE_PROVIDER_SITE_OTHER): Admitting: Ophthalmology

## 2011-10-11 DIAGNOSIS — E11359 Type 2 diabetes mellitus with proliferative diabetic retinopathy without macular edema: Secondary | ICD-10-CM

## 2011-10-11 DIAGNOSIS — E1139 Type 2 diabetes mellitus with other diabetic ophthalmic complication: Secondary | ICD-10-CM

## 2011-10-11 DIAGNOSIS — E1165 Type 2 diabetes mellitus with hyperglycemia: Secondary | ICD-10-CM

## 2011-10-18 ENCOUNTER — Other Ambulatory Visit: Payer: Self-pay | Admitting: Family Medicine

## 2011-10-18 DIAGNOSIS — E041 Nontoxic single thyroid nodule: Secondary | ICD-10-CM

## 2011-10-31 ENCOUNTER — Inpatient Hospital Stay: Admission: RE | Admit: 2011-10-31 | Source: Ambulatory Visit

## 2011-12-12 ENCOUNTER — Inpatient Hospital Stay (HOSPITAL_COMMUNITY): Admission: RE | Admit: 2011-12-12 | Source: Ambulatory Visit

## 2011-12-17 ENCOUNTER — Ambulatory Visit (INDEPENDENT_AMBULATORY_CARE_PROVIDER_SITE_OTHER): Admitting: Ophthalmology

## 2011-12-17 DIAGNOSIS — H2510 Age-related nuclear cataract, unspecified eye: Secondary | ICD-10-CM

## 2011-12-17 DIAGNOSIS — E1165 Type 2 diabetes mellitus with hyperglycemia: Secondary | ICD-10-CM

## 2011-12-17 DIAGNOSIS — E11359 Type 2 diabetes mellitus with proliferative diabetic retinopathy without macular edema: Secondary | ICD-10-CM

## 2011-12-17 DIAGNOSIS — E1139 Type 2 diabetes mellitus with other diabetic ophthalmic complication: Secondary | ICD-10-CM

## 2011-12-25 ENCOUNTER — Ambulatory Visit (HOSPITAL_COMMUNITY)
Admission: RE | Admit: 2011-12-25 | Discharge: 2011-12-25 | Disposition: A | Discharge status: Home / Self Care | Source: Ambulatory Visit | Attending: Family Medicine | Admitting: Family Medicine

## 2011-12-25 DIAGNOSIS — E042 Nontoxic multinodular goiter: Secondary | ICD-10-CM | POA: Insufficient documentation

## 2011-12-25 DIAGNOSIS — E041 Nontoxic single thyroid nodule: Secondary | ICD-10-CM

## 2011-12-25 NOTE — Procedures (Signed)
US guided isthmus nodule biopsy  25 g needle aspiration x 4 Pt tolerated well  Path pending

## 2011-12-26 ENCOUNTER — Telehealth (HOSPITAL_COMMUNITY): Payer: Self-pay | Admitting: *Deleted

## 2011-12-26 NOTE — Telephone Encounter (Signed)
Post procedure follow up call.  Spoke with pt, says doing well, no problems at this time.

## 2012-03-03 ENCOUNTER — Encounter (INDEPENDENT_AMBULATORY_CARE_PROVIDER_SITE_OTHER): Admitting: Ophthalmology

## 2012-03-19 ENCOUNTER — Ambulatory Visit (INDEPENDENT_AMBULATORY_CARE_PROVIDER_SITE_OTHER): Admitting: Ophthalmology

## 2012-03-19 DIAGNOSIS — H26499 Other secondary cataract, unspecified eye: Secondary | ICD-10-CM

## 2012-03-19 DIAGNOSIS — E1139 Type 2 diabetes mellitus with other diabetic ophthalmic complication: Secondary | ICD-10-CM

## 2012-03-19 DIAGNOSIS — E11359 Type 2 diabetes mellitus with proliferative diabetic retinopathy without macular edema: Secondary | ICD-10-CM

## 2012-03-19 DIAGNOSIS — H3581 Retinal edema: Secondary | ICD-10-CM

## 2012-03-19 DIAGNOSIS — H33309 Unspecified retinal break, unspecified eye: Secondary | ICD-10-CM

## 2012-07-21 ENCOUNTER — Ambulatory Visit (INDEPENDENT_AMBULATORY_CARE_PROVIDER_SITE_OTHER): Admitting: Ophthalmology

## 2012-08-27 ENCOUNTER — Ambulatory Visit (INDEPENDENT_AMBULATORY_CARE_PROVIDER_SITE_OTHER): Payer: Self-pay | Admitting: Ophthalmology

## 2012-08-27 DIAGNOSIS — H3581 Retinal edema: Secondary | ICD-10-CM

## 2012-08-27 DIAGNOSIS — H26499 Other secondary cataract, unspecified eye: Secondary | ICD-10-CM

## 2012-08-27 DIAGNOSIS — E11359 Type 2 diabetes mellitus with proliferative diabetic retinopathy without macular edema: Secondary | ICD-10-CM

## 2012-08-27 DIAGNOSIS — E1139 Type 2 diabetes mellitus with other diabetic ophthalmic complication: Secondary | ICD-10-CM

## 2012-10-27 IMAGING — US US SOFT TISSUE HEAD/NECK
1 series · 14 of 25 positions shown · non-contrast
Comparison: 08/15/2010.

CLINICAL DATA: Thyroid goiter.

THYROID ULTRASOUND
TECHNIQUE: Ultrasound examination of the thyroid gland and adjacent
soft tissues was performed.

[Series 1: us soft tissue head/neck · 0.09mm/px · 14 of 49 slices shown]
[im 1/49]
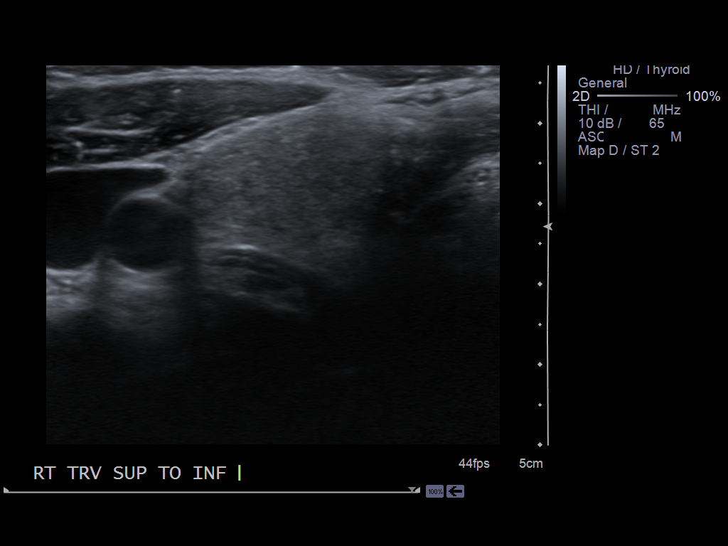
[im 5/49]
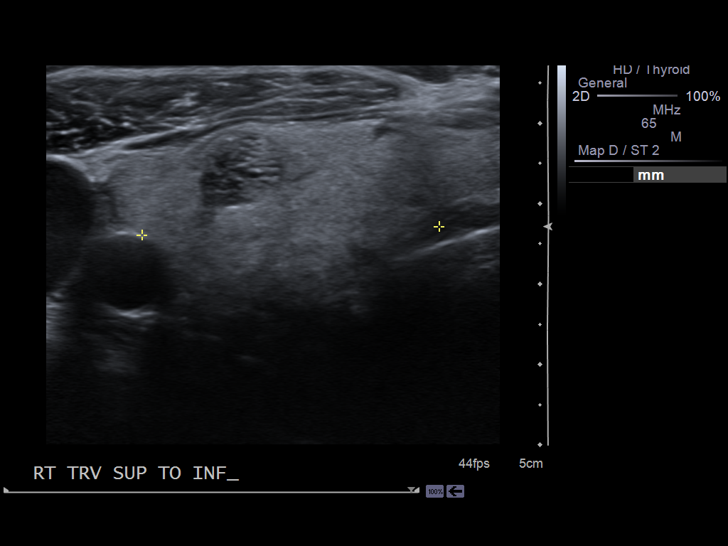
[im 9/49]
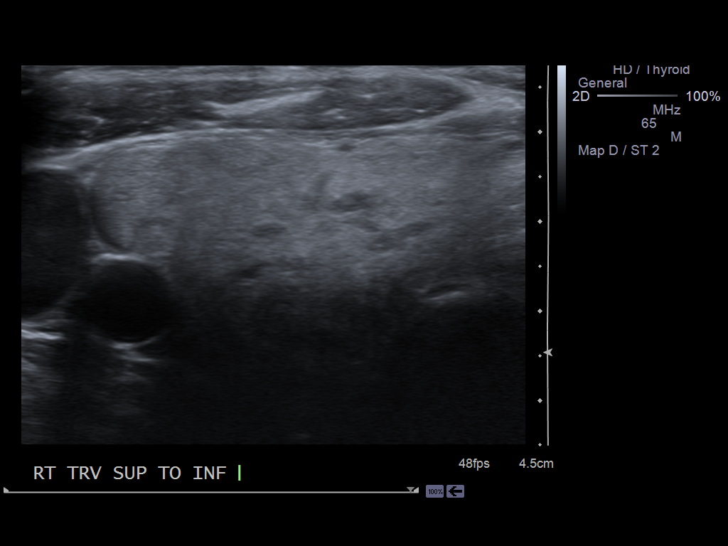
[im 13/49]
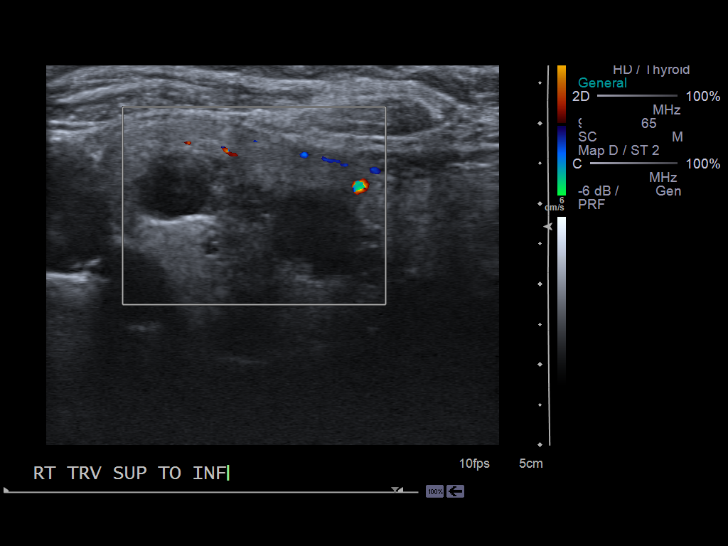
[im 17/49]
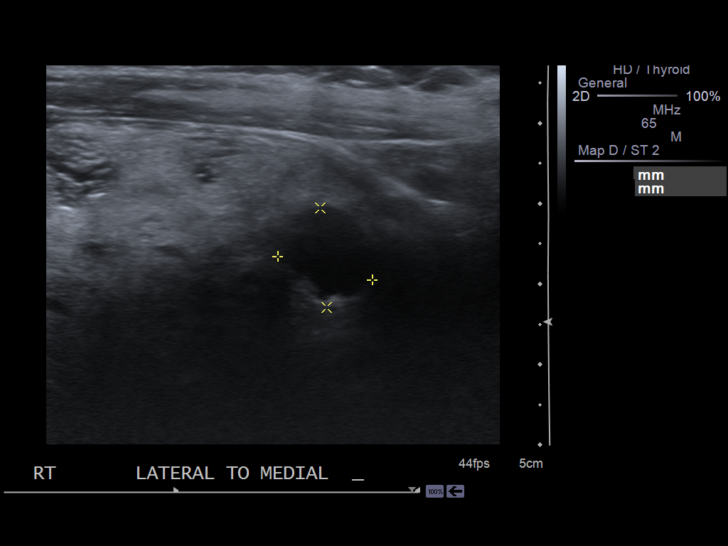
[im 19/49]
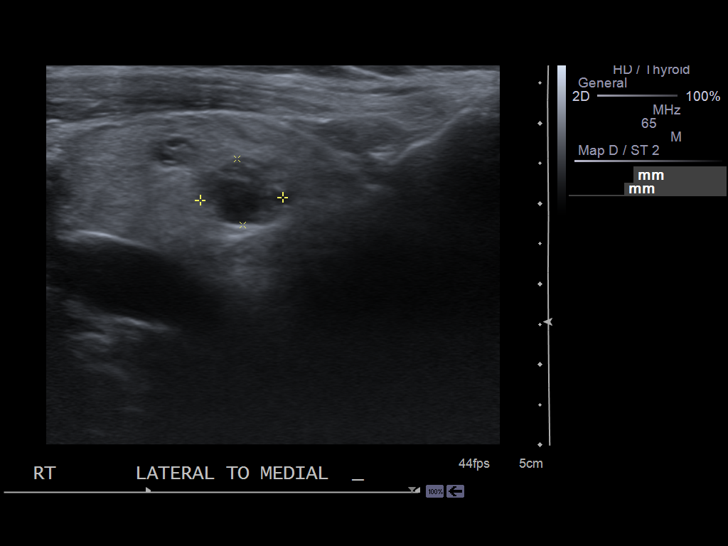
[im 23/49]
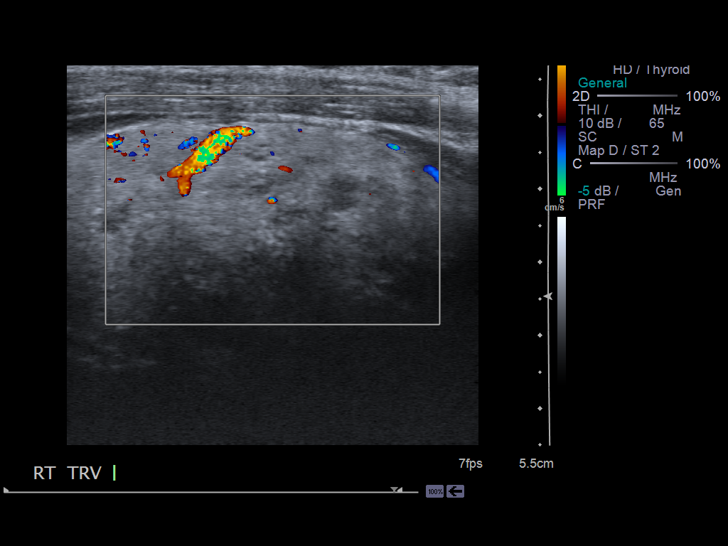
[im 27/49]
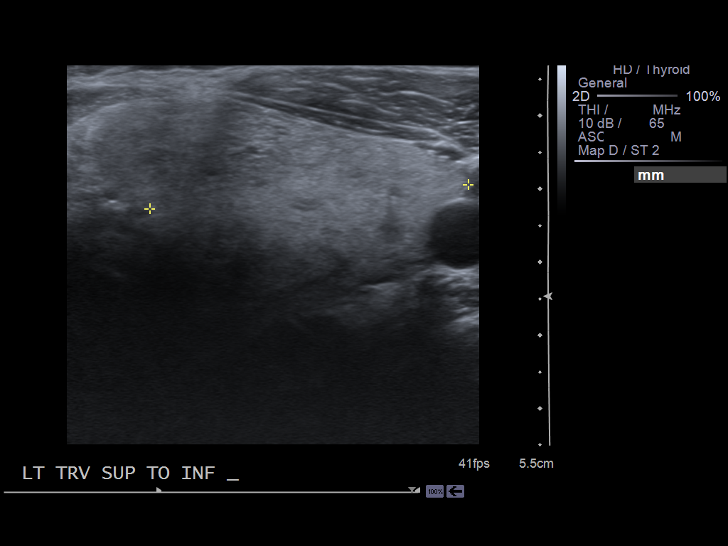
[im 31/49]
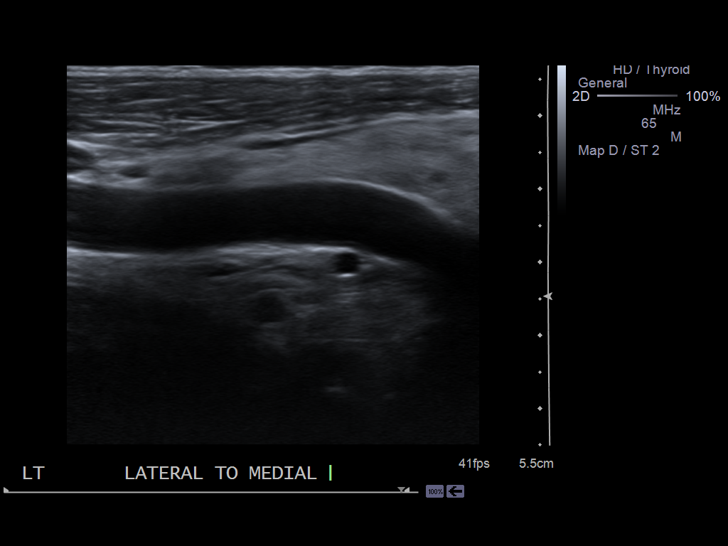
[im 33/49]
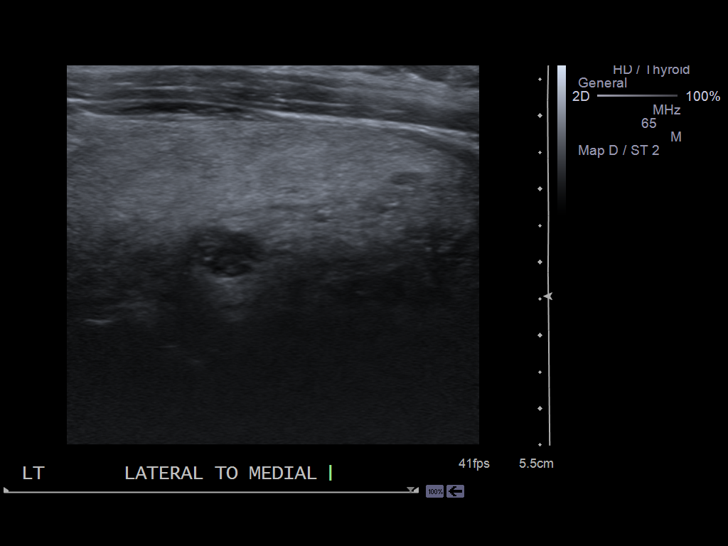
[im 37/49]
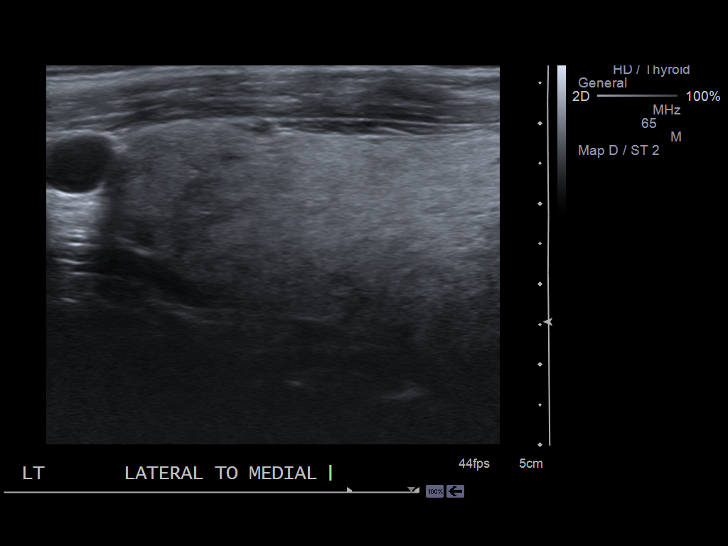
[im 41/49]
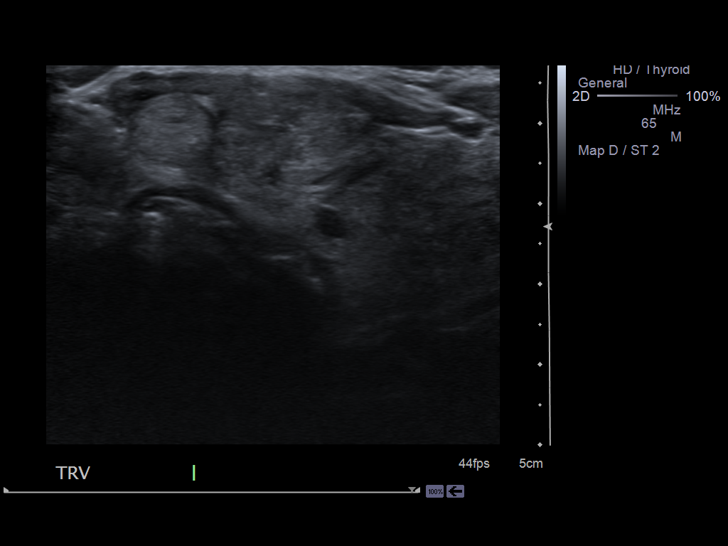
[im 45/49]
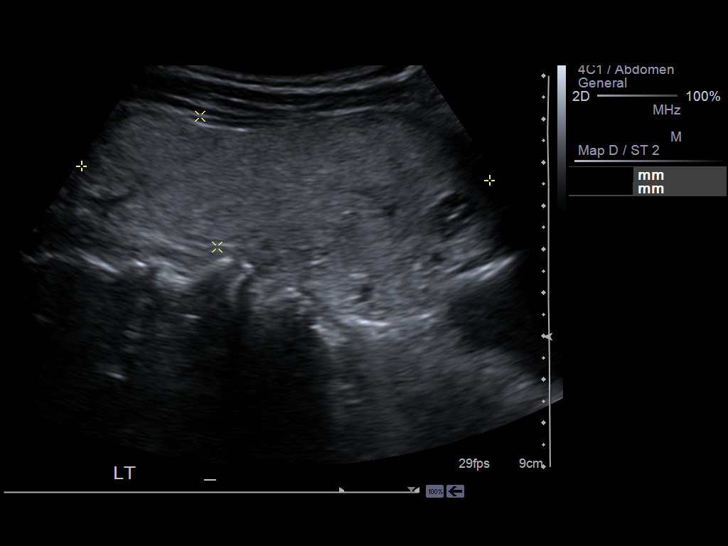
[im 49/49]
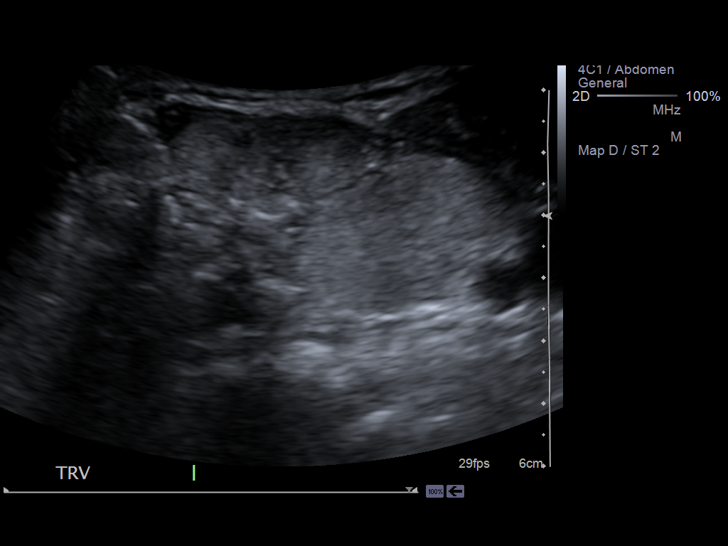

[14 of 25 positions shown; findings below may reference images not displayed]

FINDINGS: Right thyroid lobe:  Measures 9.1 x 3.1 x 3.7 cm
Left thyroid lobe:  Measures 9.4 x 3.0 x 4.4 cm.
Isthmus:  Measures 1.8 cm.

Diffusely heterogeneous thyroid echotexture.

Focal nodules:  There are multiple bilateral thyroid nodules.  The
largest lesion in the right lobe measures 1.3 x 1.0 x 1.1 cm. This
is stable.

The largest lesion in the left lobe measures 1.4 x 0.8 x 0.9 cm.
This is stable.

The largest lesion is located in the isthmus measures 2.1 x 1.5 x
2.1 cm.  This is increased in size since the prior ultrasound
examination.  On the previous study measured 1.9 x 0.8 x 1.2 cm.
Recommend fine needle aspiration/biopsy.

Lymphadenopathy:  None visualized.
IMPRESSION: 1.  Markedly enlarged multinodular thyroid goiter.
2.  Interval enlargement of the isthmic nodule now measuring 2.1 x
1.5 x 2.1 cm.  Recommend fine-needle aspiration/biopsy.

## 2012-10-29 ENCOUNTER — Encounter (INDEPENDENT_AMBULATORY_CARE_PROVIDER_SITE_OTHER): Admitting: Ophthalmology

## 2012-11-19 ENCOUNTER — Encounter (INDEPENDENT_AMBULATORY_CARE_PROVIDER_SITE_OTHER): Admitting: Ophthalmology

## 2013-01-01 ENCOUNTER — Ambulatory Visit (INDEPENDENT_AMBULATORY_CARE_PROVIDER_SITE_OTHER): Admitting: Ophthalmology

## 2013-01-01 DIAGNOSIS — E1165 Type 2 diabetes mellitus with hyperglycemia: Secondary | ICD-10-CM

## 2013-01-01 DIAGNOSIS — E11359 Type 2 diabetes mellitus with proliferative diabetic retinopathy without macular edema: Secondary | ICD-10-CM

## 2013-01-01 DIAGNOSIS — E1139 Type 2 diabetes mellitus with other diabetic ophthalmic complication: Secondary | ICD-10-CM

## 2013-01-01 DIAGNOSIS — H26499 Other secondary cataract, unspecified eye: Secondary | ICD-10-CM

## 2013-01-01 LAB — HM DIABETES EYE EXAM

## 2013-01-08 ENCOUNTER — Emergency Department (HOSPITAL_COMMUNITY): Payer: Self-pay

## 2013-01-08 ENCOUNTER — Inpatient Hospital Stay (HOSPITAL_COMMUNITY)
Admission: EM | Admit: 2013-01-08 | Discharge: 2013-01-15 | DRG: 492 | Disposition: A | Discharge status: Home Health | Payer: Medicare Other | Attending: Family Medicine | Admitting: Family Medicine

## 2013-01-08 ENCOUNTER — Encounter (HOSPITAL_COMMUNITY): Payer: Self-pay | Admitting: Emergency Medicine

## 2013-01-08 DIAGNOSIS — E1142 Type 2 diabetes mellitus with diabetic polyneuropathy: Secondary | ICD-10-CM | POA: Diagnosis present

## 2013-01-08 DIAGNOSIS — D62 Acute posthemorrhagic anemia: Secondary | ICD-10-CM

## 2013-01-08 DIAGNOSIS — I4891 Unspecified atrial fibrillation: Secondary | ICD-10-CM | POA: Diagnosis present

## 2013-01-08 DIAGNOSIS — Z8673 Personal history of transient ischemic attack (TIA), and cerebral infarction without residual deficits: Secondary | ICD-10-CM

## 2013-01-08 DIAGNOSIS — I252 Old myocardial infarction: Secondary | ICD-10-CM | POA: Diagnosis present

## 2013-01-08 DIAGNOSIS — N183 Chronic kidney disease, stage 3 unspecified: Secondary | ICD-10-CM

## 2013-01-08 DIAGNOSIS — D649 Anemia, unspecified: Secondary | ICD-10-CM | POA: Diagnosis present

## 2013-01-08 DIAGNOSIS — I480 Paroxysmal atrial fibrillation: Secondary | ICD-10-CM | POA: Diagnosis present

## 2013-01-08 DIAGNOSIS — I214 Non-ST elevation (NSTEMI) myocardial infarction: Secondary | ICD-10-CM

## 2013-01-08 DIAGNOSIS — N058 Unspecified nephritic syndrome with other morphologic changes: Secondary | ICD-10-CM | POA: Diagnosis present

## 2013-01-08 DIAGNOSIS — R55 Syncope and collapse: Secondary | ICD-10-CM

## 2013-01-08 DIAGNOSIS — Z7982 Long term (current) use of aspirin: Secondary | ICD-10-CM

## 2013-01-08 DIAGNOSIS — Z87891 Personal history of nicotine dependence: Secondary | ICD-10-CM

## 2013-01-08 DIAGNOSIS — I700 Atherosclerosis of aorta: Secondary | ICD-10-CM

## 2013-01-08 DIAGNOSIS — Z79899 Other long term (current) drug therapy: Secondary | ICD-10-CM

## 2013-01-08 DIAGNOSIS — S82899A Other fracture of unspecified lower leg, initial encounter for closed fracture: Principal | ICD-10-CM | POA: Diagnosis present

## 2013-01-08 DIAGNOSIS — E1149 Type 2 diabetes mellitus with other diabetic neurological complication: Secondary | ICD-10-CM | POA: Diagnosis present

## 2013-01-08 DIAGNOSIS — E1140 Type 2 diabetes mellitus with diabetic neuropathy, unspecified: Secondary | ICD-10-CM | POA: Diagnosis present

## 2013-01-08 DIAGNOSIS — I10 Essential (primary) hypertension: Secondary | ICD-10-CM | POA: Diagnosis present

## 2013-01-08 DIAGNOSIS — I482 Chronic atrial fibrillation, unspecified: Secondary | ICD-10-CM

## 2013-01-08 DIAGNOSIS — N184 Chronic kidney disease, stage 4 (severe): Secondary | ICD-10-CM | POA: Diagnosis present

## 2013-01-08 DIAGNOSIS — I129 Hypertensive chronic kidney disease with stage 1 through stage 4 chronic kidney disease, or unspecified chronic kidney disease: Secondary | ICD-10-CM | POA: Diagnosis present

## 2013-01-08 DIAGNOSIS — Z794 Long term (current) use of insulin: Secondary | ICD-10-CM

## 2013-01-08 DIAGNOSIS — I2510 Atherosclerotic heart disease of native coronary artery without angina pectoris: Secondary | ICD-10-CM | POA: Diagnosis present

## 2013-01-08 DIAGNOSIS — E119 Type 2 diabetes mellitus without complications: Secondary | ICD-10-CM | POA: Diagnosis present

## 2013-01-08 DIAGNOSIS — E1129 Type 2 diabetes mellitus with other diabetic kidney complication: Secondary | ICD-10-CM | POA: Diagnosis present

## 2013-01-08 DIAGNOSIS — S82891A Other fracture of right lower leg, initial encounter for closed fracture: Secondary | ICD-10-CM

## 2013-01-08 DIAGNOSIS — R296 Repeated falls: Secondary | ICD-10-CM | POA: Diagnosis present

## 2013-01-08 DIAGNOSIS — D72829 Elevated white blood cell count, unspecified: Secondary | ICD-10-CM | POA: Diagnosis present

## 2013-01-08 DIAGNOSIS — N289 Disorder of kidney and ureter, unspecified: Secondary | ICD-10-CM | POA: Diagnosis present

## 2013-01-08 HISTORY — DX: Chronic kidney disease, stage 3 (moderate): N18.3

## 2013-01-08 HISTORY — DX: Type 2 diabetes mellitus without complications: E11.9

## 2013-01-08 HISTORY — DX: Chronic kidney disease, stage 3 unspecified: N18.30

## 2013-01-08 LAB — TROPONIN I: Troponin I: <0.3 ng/mL (ref <0.30)

## 2013-01-08 LAB — CBC WITH DIFFERENTIAL/PLATELET
Basophils Absolute: 0.1 10*3/uL (ref 0.0–0.1)
Lymphocytes Relative: 10 % — ABNORMAL LOW (ref 12–46)
Lymphs Abs: 1 10*3/uL (ref 0.7–4.0)
Neutrophils Relative %: 84 % — ABNORMAL HIGH (ref 43–77)
Platelets: 344 10*3/uL (ref 150–400)
RBC: 3.6 MIL/uL — ABNORMAL LOW (ref 3.87–5.11)
RDW: 13.2 % (ref 11.5–15.5)
WBC: 10.2 10*3/uL (ref 4.0–10.5)

## 2013-01-08 LAB — GLUCOSE, CAPILLARY
Glucose-Capillary: 190 mg/dL — ABNORMAL HIGH (ref 70–99)
Glucose-Capillary: 251 mg/dL — ABNORMAL HIGH (ref 70–99)
Glucose-Capillary: 237 mg/dL — ABNORMAL HIGH (ref 70–99)

## 2013-01-08 LAB — POCT I-STAT, CHEM 8
Calcium, Ion: 1.14 mmol/L (ref 1.12–1.23)
Chloride: 110 mEq/L (ref 96–112)
HCT: 29 % — ABNORMAL LOW (ref 36.0–46.0)
Hemoglobin: 9.9 g/dL — ABNORMAL LOW (ref 12.0–15.0)
TCO2: 21 mmol/L (ref 0–100)

## 2013-01-08 LAB — PROTIME-INR
INR: 1.06 (ref 0.00–1.49)
Prothrombin Time: 13.7 seconds (ref 11.6–15.2)

## 2013-01-08 LAB — HEPARIN LEVEL (UNFRACTIONATED): Heparin Unfractionated: 0.53 IU/mL (ref 0.30–0.70)

## 2013-01-08 LAB — TSH: TSH: 0.747 u[IU]/mL (ref 0.350–4.500)

## 2013-01-08 MED ORDER — HEPARIN (PORCINE) IN NACL 100-0.45 UNIT/ML-% IJ SOLN
1300.0000 [IU]/h | INTRAMUSCULAR | Status: DC
  Administered 2013-01-08: 1300 [IU]/h via INTRAVENOUS
  Filled 2013-01-08 (×4): qty 250

## 2013-01-08 MED ORDER — SODIUM CHLORIDE 0.9 % IJ SOLN
3.0000 mL | Freq: Two times a day (BID) | INTRAMUSCULAR | Status: DC
  Administered 2013-01-10 – 2013-01-15 (×8): 3 mL via INTRAVENOUS

## 2013-01-08 MED ORDER — ACETAMINOPHEN 325 MG PO TABS: 650.0000 mg | ORAL_TABLET | Freq: Four times a day (QID) | ORAL | Status: DC | PRN

## 2013-01-08 MED ORDER — ONDANSETRON HCL 4 MG/2ML IJ SOLN
4.0000 mg | Freq: Four times a day (QID) | INTRAMUSCULAR | Status: DC | PRN
  Administered 2013-01-09: 4 mg via INTRAVENOUS

## 2013-01-08 MED ORDER — INSULIN ASPART 100 UNIT/ML ~~LOC~~ SOLN
0.0000 [IU] | Freq: Three times a day (TID) | SUBCUTANEOUS | Status: DC
  Administered 2013-01-09 – 2013-01-10 (×2): 2 [IU] via SUBCUTANEOUS
  Administered 2013-01-10: 3 [IU] via SUBCUTANEOUS
  Administered 2013-01-10 – 2013-01-13 (×4): 2 [IU] via SUBCUTANEOUS
  Administered 2013-01-13: 3 [IU] via SUBCUTANEOUS
  Administered 2013-01-13 – 2013-01-15 (×5): 2 [IU] via SUBCUTANEOUS

## 2013-01-08 MED ORDER — ONDANSETRON HCL 4 MG/2ML IJ SOLN
4.0000 mg | Freq: Once | INTRAMUSCULAR | Status: AC
  Administered 2013-01-08: 4 mg via INTRAVENOUS
  Filled 2013-01-08: qty 2

## 2013-01-08 MED ORDER — DILTIAZEM HCL 25 MG/5ML IV SOLN
20.0000 mg | Freq: Once | INTRAVENOUS | Status: AC
  Administered 2013-01-08: 20 mg via INTRAVENOUS
  Filled 2013-01-08: qty 5

## 2013-01-08 MED ORDER — ACETAMINOPHEN 650 MG RE SUPP: 650.0000 mg | Freq: Four times a day (QID) | RECTAL | Status: DC | PRN

## 2013-01-08 MED ORDER — DILTIAZEM HCL 100 MG IV SOLR
5.0000 mg/h | Freq: Once | INTRAVENOUS | Status: AC
  Administered 2013-01-08: 5 mg/h via INTRAVENOUS

## 2013-01-08 MED ORDER — ATORVASTATIN CALCIUM 40 MG PO TABS
40.0000 mg | ORAL_TABLET | Freq: Every day | ORAL | Status: DC
  Administered 2013-01-08: 40 mg via ORAL
  Filled 2013-01-08 (×2): qty 1

## 2013-01-08 MED ORDER — SODIUM CHLORIDE 0.9 % IV SOLN
INTRAVENOUS | Status: DC
  Administered 2013-01-09 – 2013-01-10 (×3): via INTRAVENOUS
  Administered 2013-01-11: 125 mL via INTRAVENOUS
  Administered 2013-01-11: 21:00:00 via INTRAVENOUS

## 2013-01-08 MED ORDER — ZOLPIDEM TARTRATE 5 MG PO TABS: 5.0000 mg | ORAL_TABLET | Freq: Every evening | ORAL | Status: DC | PRN

## 2013-01-08 MED ORDER — OXYCODONE HCL 5 MG PO TABS
5.0000 mg | ORAL_TABLET | ORAL | Status: DC | PRN
  Filled 2013-01-08: qty 1

## 2013-01-08 MED ORDER — ONDANSETRON HCL 4 MG PO TABS: 4.0000 mg | ORAL_TABLET | Freq: Four times a day (QID) | ORAL | Status: DC | PRN

## 2013-01-08 MED ORDER — DILTIAZEM HCL 30 MG PO TABS
30.0000 mg | ORAL_TABLET | Freq: Four times a day (QID) | ORAL | Status: AC
  Administered 2013-01-09 (×2): 30 mg via ORAL
  Filled 2013-01-08 (×2): qty 1

## 2013-01-08 MED ORDER — INSULIN GLARGINE 100 UNIT/ML ~~LOC~~ SOLN
15.0000 [IU] | Freq: Every day | SUBCUTANEOUS | Status: DC
  Administered 2013-01-08 – 2013-01-14 (×7): 15 [IU] via SUBCUTANEOUS
  Filled 2013-01-08 (×9): qty 0.15

## 2013-01-08 MED ORDER — DILTIAZEM HCL 60 MG PO TABS
120.0000 mg | ORAL_TABLET | Freq: Every day | ORAL | Status: DC
  Administered 2013-01-10 – 2013-01-11 (×2): 120 mg via ORAL
  Filled 2013-01-08 (×4): qty 2

## 2013-01-08 MED ORDER — HEPARIN BOLUS VIA INFUSION
4000.0000 [IU] | Freq: Once | INTRAVENOUS | Status: AC
  Administered 2013-01-08: 4000 [IU] via INTRAVENOUS

## 2013-01-08 NOTE — ED Notes (Signed)
[facility] CBG 145

## 2013-01-08 NOTE — ED Notes (Signed)
BIB [facility]. Patient fall at home from bathroom (1000). Patient family states that she may have passed out. Patient does not remember events prior. Afib present with no patient Hx. A/O x4 Right ankle deformity. Patient has no sensation in that foot (diabetic neuropathy Hx) pulses present.

## 2013-01-08 NOTE — Consult Note (Signed)
Reason for Consult:  Right ankle fracture/dislocation Referring Physician:   ED MD  [name] is an 57 y.o. female.  HPI:   57 yo female who sustained a mechanical fall after feeling light-headed.  Was brought to the ER with an obvious right ankle deformity.  Was also found to be in Afib.  Past Medical History  Diagnosis Date  . Hypertension   . Diabetes mellitus   . Chronic kidney disease, stage III (moderate)     Past Surgical History  Procedure Laterality Date  . Laser surgery for diabetic retinopathy    . Abdominal hysterectomy      Family History  Problem Relation Age of Onset  . Hypertension Father     Also had CAD  . Diabetes Mother     No history CAD    Social History:  reports that she has never smoked. She does not have any smokeless tobacco history on file. She reports that she does not drink alcohol. Her drug history is not on file.  Allergies:  Allergies  Allergen Reactions  . Lisinopril Other (See Comments)    coughing    Medications: I have reviewed the patient's current medications.  Results for orders placed during the hospital encounter of 01/08/13 (from the past 48 hour(s))  GLUCOSE, CAPILLARY     Status: Abnormal   Collection Time    01/08/13 11:46 AM      Result Value Range   Glucose-Capillary 190 (*) 70 - 99 mg/dL  CBC WITH DIFFERENTIAL     Status: Abnormal   Collection Time    01/08/13 12:16 PM      Result Value Range   WBC 10.2  4.0 - 10.5 K/uL   RBC 3.60 (*) 3.87 - 5.11 MIL/uL   Hemoglobin 9.5 (*) 12.0 - 15.0 g/dL   HCT 28.4 (*) 36.0 - 46.0 %   MCV 78.9  78.0 - 100.0 fL   MCH 26.4  26.0 - 34.0 pg   MCHC 33.5  30.0 - 36.0 g/dL   RDW 13.2  11.5 - 15.5 %   Platelets 344  150 - 400 K/uL   Neutrophils Relative 84 (*) 43 - 77 %   Neutro Abs 8.5 (*) 1.7 - 7.7 K/uL   Lymphocytes Relative 10 (*) 12 - 46 %   Lymphs Abs 1.0  0.7 - 4.0 K/uL   Monocytes Relative 4  3 - 12 %   Monocytes Absolute 0.4  0.1 - 1.0 K/uL   Eosinophils  Relative 2  0 - 5 %   Eosinophils Absolute 0.2  0.0 - 0.7 K/uL   Basophils Relative 1  0 - 1 %   Basophils Absolute 0.1  0.0 - 0.1 K/uL  PROTIME-INR     Status: None   Collection Time    01/08/13 12:16 PM      Result Value Range   Prothrombin Time 13.7  11.6 - 15.2 seconds   INR 1.06  0.00 - 1.49  APTT     Status: None   Collection Time    01/08/13 12:16 PM      Result Value Range   aPTT 29  24 - 37 seconds  TSH     Status: None   Collection Time    01/08/13 12:18 PM      Result Value Range   TSH 0.747  0.350 - 4.500 uIU/mL  TROPONIN I     Status: None   Collection Time    01/08/13 12:21 PM  Result Value Range   Troponin I <0.30  <0.30 ng/mL   Comment:            Due to the release kinetics of cTnI,     a negative result within the first hours     of the onset of symptoms does not rule out     myocardial infarction with certainty.     If myocardial infarction is still suspected,     repeat the test at appropriate intervals.  POCT I-STAT, CHEM 8     Status: Abnormal   Collection Time    01/08/13 12:42 PM      Result Value Range   Sodium 141  135 - 145 mEq/L   Potassium 4.4  3.5 - 5.1 mEq/L   Chloride 110  96 - 112 mEq/L   BUN 22  6 - 23 mg/dL   Creatinine, Ser 1.80 (*) 0.50 - 1.10 mg/dL   Glucose, Bld 212 (*) 70 - 99 mg/dL   Calcium, Ion 1.14  1.12 - 1.23 mmol/L   TCO2 21  0 - 100 mmol/L   Hemoglobin 9.9 (*) 12.0 - 15.0 g/dL   HCT 29.0 (*) 36.0 - 46.0 %  GLUCOSE, CAPILLARY     Status: Abnormal   Collection Time    01/08/13  6:12 PM      Result Value Range   Glucose-Capillary 251 (*) 70 - 99 mg/dL   Comment 1 MD NOTIFIED    HEPARIN LEVEL (UNFRACTIONATED)     Status: None   Collection Time    01/08/13  7:19 PM      Result Value Range   Heparin Unfractionated 0.53  0.30 - 0.70 IU/mL   Comment:            IF HEPARIN RESULTS ARE BELOW     EXPECTED VALUES, AND PATIENT     DOSAGE HAS BEEN CONFIRMED,     SUGGEST FOLLOW UP TESTING     OF ANTITHROMBIN III  LEVELS.    Dg Chest 1 View  01/08/2013  *RADIOLOGY REPORT*  Clinical Data: Preoperative respiratory examination for right ankle surgery/fracture.  CHEST - 1 VIEW  Comparison: 10/09/2011 and 07/25/2011.  Findings: There are low lung volumes with mild resulting bibasilar atelectasis.  Mild cardiomegaly appears stable.  There is no edema, confluent airspace opacity, pleural effusion or pneumothorax.  No fractures are identified.  Telemetry leads overlie the chest.  IMPRESSION: Low lung volumes with bibasilar atelectasis (especially on the left).  Stable cardiomegaly.   Original Report Authenticated By: Richardean Sale, M.D.    Dg Ankle 2 Views Right  01/08/2013  *RADIOLOGY REPORT*  Clinical Data: Reduction of fracture dislocation.  RIGHT ANKLE - 2 VIEW  Comparison: Plain films 01/08/2013 and 11:59 a.m.  Findings: The patient is in a new fiberglass cast.  Position and alignment of the tibiotalar joint is improved with widening of the medial clear space persisting.  Position and alignment of a distal fibular fracture is also improved.  No new abnormality is identified.  IMPRESSION: Improved position and alignment of fracture dislocation.  No new abnormality.   Original Report Authenticated By: Orlean Patten, M.D.    Dg Ankle Complete Right  01/08/2013  *RADIOLOGY REPORT*  Clinical Data: Ankle pain and deformity status post fall.  RIGHT ANKLE - COMPLETE 3+ VIEW  Comparison: None.  Findings: There is an oblique fracture of the distal fibular diaphysis associated with moderate posterolateral displacement. There is widening of the ankle mortise with lateral subluxation of  the talus by approximately 1.5 cm.  There is likely a fracture of the posterior lateral aspect of the tibial plafond.  No talar dome fracture is identified.  A plantar calcaneal spur is noted.  IMPRESSION: Right ankle fracture dislocation as described.   Original Report Authenticated By: Richardean Sale, M.D.    Ct Head Wo Contrast  01/08/2013   *RADIOLOGY REPORT*  Clinical Data: Status post fall.  Possible syncopal episode.  CT HEAD WITHOUT CONTRAST  Technique:  Contiguous axial images were obtained from the base of the skull through the vertex without contrast.  Comparison: Head CT 10/09/2011.  Findings: There is no evidence of acute intracranial hemorrhage, mass lesion, brain edema or extra-axial fluid collection.  Mild periventricular white matter disease appears stable with the exception of new decreased density in the right lentiform nucleus on image 13.  No cortical based infarct or hydrocephalus is identified.  There is a probable mucous retention cyst within the left maxillary sinus.  The visualized paranasal sinuses, mastoid air cells and middle ears are otherwise clear.  There is no evidence of calvarial fracture.  IMPRESSION:  1.  New ill-defined low density in the right lentiform nucleus, likely an age indeterminate small vessel stroke. 2.  Otherwise stable examination.  No acute post-traumatic findings identified.   Original Report Authenticated By: Richardean Sale, M.D.     ROS Blood pressure 182/61, pulse 65, temperature 98 F (36.7 C), temperature source Oral, resp. rate 21, height 5\' 7"  (1.702 m), weight 108.863 kg (240 lb), SpO2 100.00%. Physical Exam  Constitutional: She is oriented to person, place, and time. She appears well-developed and well-nourished.  HENT:  Head: Normocephalic and atraumatic.  Eyes: EOM are normal. Pupils are equal, round, and reactive to light.  Neck: Normal range of motion. Neck supple.  Cardiovascular: Normal rate and regular rhythm.   Respiratory: Effort normal and breath sounds normal.  GI: Soft. Bowel sounds are normal.  Musculoskeletal:       Right ankle: She exhibits swelling, ecchymosis and deformity. Tenderness. Lateral malleolus tenderness found.  Neurological: She is alert and oriented to person, place, and time.  Skin: Skin is warm and dry.  Psychiatric: She has a normal mood and  affect.   Her right foot has bounding pulses/is well-perfused, but very limited sensation  Assessment/Plan: Right ankle fracture/dislocation 1) Reduced and splinted in the ER.  She needs to remain non-weight bearing on her right ankle with ice and elevation.  She will need surgery this weekend to stabilize her ankle once she is medically cleared.  I have spoken to her about this as well as the Cardiologist.  Will likely post her for Saturday am unless hear otherwise.  Mcarthur Rossetti 01/08/2013, 8:31 PM

## 2013-01-08 NOTE — Consult Note (Signed)
CARDIOLOGY CONSULT NOTE   Patient ID: [name] MRN: [id number] DOB/AGE: June 15, 1956 57 y.o.  Admit date: 01/08/2013  Primary Physician   Delman Cheadle, MD Consulting Cardiologist   Dr. Rozann Lesches Reason for Consultation  Chest pain, atrial fibrillation, syncope  [id number] [name] is a 57 y.o. female presenting to the ER after awakening this morning with a dull chest discomfort, feeling of nausea, also headache. At one point, she stood up out of bed to walk to the bathroom due to intense nausea, and had an episode of syncope. She notes no focal motor weakness, no definite sense of palpitations. When she realized what had happened, she did feel discomfort in her right ankle and noted a deformity. EMS was summoned and it is reported that she was hypertensive at the time with heart rates greater than the 130s, rapid atrial fibrillation documented. She has been managed with intravenous Cardizem and also heparin, spontaneously converted to sinus rhythm in the ER.  Imaging studies include a head CT demonstrating a new ill-defined low-density right lentiform nucleus region, indeterminate for a small vessel stroke. No hemorrhage noted. Ankle films show right ankle fracture with dislocation. She has been seen by orthopedics and has a stabilizer in place. Plans are for her to be admitted to the family practice service for further management.  She does report a history of type 2 diabetes mellitus and hypertension, has been out of some of her antihypertensive medicines recently. She reports no history of cardiovascular disease, no known history of stroke or cardiac arrhythmia. We are onsulted to assist with her management.   Past Medical History  Diagnosis Date  . Hypertension   . Diabetes mellitus   . Chronic kidney disease, stage III (moderate)      Past Surgical History  Procedure Laterality Date  . Laser surgery for diabetic retinopathy    . Abdominal hysterectomy      Allergies    Allergen Reactions  . Lisinopril Other (See Comments)    coughing     . heparin 1,300 Units/hr (01/08/13 1313)    Medication Sig  atorvastatin (LIPITOR) 40 MG tablet Take 40 mg by mouth at bedtime.  carvedilol (COREG) 6.25 MG tablet Take 6.25 mg by mouth 2 (two) times daily with a meal.  insulin glargine (LANTUS) 100 UNIT/ML injection Inject 30 Units into the skin at bedtime.  insulin regular (NOVOLIN R,HUMULIN R) 100 units/mL injection Inject 10-20 Units into the skin 3 (three) times daily before meals. Sliding scale of insulin dosage     History   Social History  . Marital Status: Legally Separated    Spouse Name: N/A    Number of Children: N/A  . Years of Education: N/A   Occupational History  . Disabled    Social History Main Topics  . Smoking status: Never Smoker   . Smokeless tobacco: Not on file  . Alcohol Use: No  . Drug Use:   . Sexually Active:    Other Topics Concern  . Not on file   Social History Narrative   Patient lives with her son..    No family status information on file.   Family History  Problem Relation Age of Onset  . Hypertension Father     Also had CAD  . Diabetes Mother     No history CAD     ROS: She has had problems in the past with lower extremity edema. She has occasional musculoskeletal aches and pains. She gets her regular headaches, especially  when her blood pressure is high. She has had no cough or cold symptoms, no fevers or chills. She never gets chest pain. Full 14 point review of systems complete and found to be negative unless listed above.  Physical Exam: Blood pressure 182/61, pulse 65, temperature 98 F (36.7 C), temperature source Oral, resp. rate 21, height 5\' 7"  (1.702 m), weight 240 lb (108.863 kg), SpO2 100.00%.  General: Well developed, well nourished, female in no acute distress Head: Eyes PERRLA, No xanthomas.   Normocephalic and atraumatic, oropharynx without edema or exudate. Dentition: good Lungs: Rales in  bilateral bases. Heart: HRRR S1 S2, no rub/gallop, soft systolic murmur. pulses are 2+both upper extrem.  Slightly decreased in her right lower extremity, possibly due to swelling. Neck: No carotid bruits. No lymphadenopathy.  JVDat 8 cm. Abdomen: Bowel sounds present, abdomen soft and non-tender without masses or hernias noted. Msk:  No spine or cva tenderness. No weakness, no joint deformities or effusions, but right lower extremity is splinted and bandaged and not disturbed. Extremities: No clubbing or cyanosis. No edema.  Neuro: Alert and oriented X 3. No focal deficits noted. Psych:  Good affect, responds appropriately Skin: No rashes or lesions noted.  Labs:   Lab Results  Component Value Date   WBC 10.2 01/08/2013   HGB 9.9* 01/08/2013   HCT 29.0* 01/08/2013   MCV 78.9 01/08/2013   PLT 344 01/08/2013    Recent Labs  01/08/13 1216  INR 1.06     Recent Labs Lab 01/08/13 1242  NA 141  K 4.4  CL 110  BUN 22  CREATININE 1.80*  GLUCOSE 212*    Recent Labs  01/08/13 1221  TROPONINI <0.30   TSH  Date/Time Value Range Status  01/08/2013 12:18 PM 0.747  0.350 - 4.500 uIU/mL Final   ECG:   08-Jan-2013 14:06:40 [facility]/ED ROUTINE RECORD Normal sinus rhythm 73mm/s 82mm/mV 150Hz  8.0.1 12SL 235 CID: [id number] Referred by: Confirmed By: Baird Lyons MD Vent. rate 63 BPM PR interval 168 ms QRS duration 86 ms QT/QTc 456/467 ms P-R-T axes 5 24 56  Radiology:  Dg Chest 1 View 01/08/2013  *RADIOLOGY REPORT*  Clinical Data: Preoperative respiratory examination for right ankle surgery/fracture.  CHEST - 1 VIEW  Comparison: 10/09/2011 and 07/25/2011.  Findings: There are low lung volumes with mild resulting bibasilar atelectasis.  Mild cardiomegaly appears stable.  There is no edema, confluent airspace opacity, pleural effusion or pneumothorax.  No fractures are identified.  Telemetry leads overlie the chest.  IMPRESSION: Low lung volumes with bibasilar  atelectasis (especially on the left).  Stable cardiomegaly.   Original Report Authenticated By: Richardean Sale, M.D.    Dg Ankle 2 Views Right 01/08/2013  *RADIOLOGY REPORT*  Clinical Data: Reduction of fracture dislocation.  RIGHT ANKLE - 2 VIEW  Comparison: Plain films 01/08/2013 and 11:59 a.m.  Findings: The patient is in a new fiberglass cast.  Position and alignment of the tibiotalar joint is improved with widening of the medial clear space persisting.  Position and alignment of a distal fibular fracture is also improved.  No new abnormality is identified.  IMPRESSION: Improved position and alignment of fracture dislocation.  No new abnormality.   Original Report Authenticated By: Orlean Patten, M.D.    Dg Ankle Complete Right 01/08/2013  *RADIOLOGY REPORT*  Clinical Data: Ankle pain and deformity status post fall.  RIGHT ANKLE - COMPLETE 3+ VIEW  Comparison: None.  Findings: There is an oblique fracture of the distal fibular  diaphysis associated with moderate posterolateral displacement. There is widening of the ankle mortise with lateral subluxation of the talus by approximately 1.5 cm.  There is likely a fracture of the posterior lateral aspect of the tibial plafond.  No talar dome fracture is identified.  A plantar calcaneal spur is noted.  IMPRESSION: Right ankle fracture dislocation as described.   Original Report Authenticated By: Richardean Sale, M.D.    Ct Head Wo Contrast 01/08/2013  *RADIOLOGY REPORT*  Clinical Data: Status post fall.  Possible syncopal episode.  CT HEAD WITHOUT CONTRAST  Technique:  Contiguous axial images were obtained from the base of the skull through the vertex without contrast.  Comparison: Head CT 10/09/2011.  Findings: There is no evidence of acute intracranial hemorrhage, mass lesion, brain edema or extra-axial fluid collection.  Mild periventricular white matter disease appears stable with the exception of new decreased density in the right lentiform nucleus on  image 13.  No cortical based infarct or hydrocephalus is identified.  There is a probable mucous retention cyst within the left maxillary sinus.  The visualized paranasal sinuses, mastoid air cells and middle ears are otherwise clear.  There is no evidence of calvarial fracture.  IMPRESSION:  1.  New ill-defined low density in the right lentiform nucleus, likely an age indeterminate small vessel stroke. 2.  Otherwise stable examination.  No acute post-traumatic findings identified.   Original Report Authenticated By: Richardean Sale, M.D.     ASSESSMENT AND PLAN:   The patient was seen this evening on call by Dr Zigmund Gottron, the patient evaluated and the data reviewed. Active Problems:  Paroxysmal atrial fibrillation, rapid ventricular response - Pt spontaneously converted to sinus rhythm. She is currently on a Cardizem drip at 5 mg per hour. M.D. Advise on converting this to oral Cardizem at 30 mg every 6 hours, changing to Cardizem CD 120 mg daily in a.m. She is currently on heparin, CHADS score is 4 for possible CVA, hypertension and diabetes. M.D. Advise on Coumadin, however she will need surgery on her right lower extremity and this may affect choice of anticoagulant/timing.  Family medicine has been contacted to admit the patient and other medical issues will be deferred to them.   Hypertension   Diabetes mellitus   Chronic kidney disease, stage III (moderate)   Signed: Rosaria Ferries, PA-C Beeper [phone]  Co-Sign MD   Attending note:  Patient seen and examined. Reviewed the case with [name]. [name] is a 57 year old woman with long-standing type 2 diabetes mellitus, hypertension, also stage III chronic kidney disease, presenting after an episode of syncope while standing, noted after the development of a dull chest discomfort associated with nausea and headache when she awoke this morning. She had no sense of palpitations, however was diagnosed with rapid atrial fibrillation on  evaluation by EMS. She has subsequently converted to sinus rhythm with intravenous Cardizem. Imaging studies in the ER show evidence of a right ankle fracture with dislocation, she has been seen by orthopedics with plans for surgery, potentially over the weekend for stabilization. Head CT also demonstrates an ill-defined low-density lesion in the right lentiform nucleus, new since prior study from January 2013, indeterminate onset small vessel stroke.  On examination she is relatively comfortable, right ankle is dressed and stabilized by orthopedics. She has increased neck girth difficult to assess JVP but no bruits, lungs are clear and nonlabored, cardiac exam reveals regular rate and rhythm with S4 and soft systolic murmur. Laboratory data reviewed finding creatinine 1.8,  hemoglobin 9.9 with MCV 78, TSH 0.74. ECG now in sinus rhythm shows no acute ST segment changes.  Patient with newly diagnosed atrial fibrillation with RVR, not certain if she has ever had any prior episodes, although with CHADS2 score of 4 in the setting of diabetes mellitus, hypertension, and evidence of indeterminate age small stroke by head CT scan. She did have a feeling of dull chest discomfort this morning, troponin I level less than 0.30 at this point.   Issues to consider now from a cardiac perspective are excluding ACS with serial cardiac markers, defining cardiac structure and function with an echocardiogram, resuming cardiac medications including blocker, and ultimately considering appropriate anticoagulant treatment for stroke risk reduction. It sounds like patient will have ankle surgery potentially over the weekend, and initiating oral anticoagulant will certainly need to wait until after this is completed and she is stable. Our service will follow with you and can guide further recommendations as well.  Satira Sark, M.D., F.A.C.C.

## 2013-01-08 NOTE — ED Notes (Addendum)
Dr Roxanne Mins informed of Blood sugar, No new orders at this time.

## 2013-01-08 NOTE — ED Notes (Signed)
Cardiologist in room to see pt.

## 2013-01-08 NOTE — ED Provider Notes (Signed)
History     CSN: [id number]  Arrival date & time 01/08/13  1126   First MD Initiated Contact with Patient 01/08/13 1141      Chief Complaint  Patient presents with  . Fall    (Consider location/radiation/quality/duration/timing/severity/associated sxs/prior treatment) Patient is a 57 y.o. female presenting with fall. The history is provided by the patient.  Fall  She woke up this morning and felt nauseous and a little dizzy and noted that an unusual sensation in her chest. She walked to the bathroom where she apparently had a syncopal episode and, when she woke up, she noted her right ankle is deformed. She has diabetic neuropathy and is not having any pain in the ankle. She denies any chest pain or heaviness or tightness or pressure but does have the unusual feeling. She denies any dyspnea or diaphoresis. EMS noted that she was in atrial fibrillation she denies any history of atrial fibrillation.  Past Medical History  Diagnosis Date  . Hypertension   . Diabetes mellitus     Past Surgical History  Procedure Laterality Date  . Laser surgery for diabetic retinopathy      History reviewed. No pertinent family history.  History  Substance Use Topics  . Smoking status: Never Smoker   . Smokeless tobacco: Not on file  . Alcohol Use: No    OB History   Grav Para Term Preterm Abortions TAB SAB Ect Mult Living                  Review of Systems  All other systems reviewed and are negative.    Allergies  Lisinopril  Home Medications   Current Outpatient Rx  Name  Route  Sig  Dispense  Refill  . atorvastatin (LIPITOR) 40 MG tablet   Oral   Take 40 mg by mouth at bedtime.         . carvedilol (COREG) 6.25 MG tablet   Oral   Take 6.25 mg by mouth 2 (two) times daily with a meal.         . insulin glargine (LANTUS) 100 UNIT/ML injection   Subcutaneous   Inject 30 Units into the skin at bedtime.         . insulin regular (NOVOLIN R,HUMULIN R) 100 units/mL  injection   Subcutaneous   Inject 10-20 Units into the skin 3 (three) times daily before meals. Sliding scale of insulin dosage            BP 142/90  Pulse 123  Temp(Src) 97.8 F (36.6 C) (Oral)  Resp 23  Ht 5\' 7"  (1.702 m)  Wt 240 lb (108.863 kg)  BMI 37.58 kg/m2  SpO2 99%  Physical Exam  Nursing note and vitals reviewed.  57 year old female, resting comfortably and in no acute distress. Vital signs are significant for tachycardia with heart rate 123, hypertension with blood pressure 142/90, tachypnea with respiratory of 23. Oxygen saturation is 99%, which is normal. Head is normocephalic and atraumatic. PERRLA, EOMI. Oropharynx is clear. Neck is nontender and supple without adenopathy or JVD. Diffuse thyroid enlargement is noted. Back is nontender and there is no CVA tenderness. Lungs are clear without rales, wheezes, or rhonchi. Chest is nontender. Heart is tachycardic and irregular without murmur. Abdomen is soft, flat, nontender without masses or hepatosplenomegaly and peristalsis is normoactive. Extremities have no cyanosis or edema. There is deformity of the right ankle with the effect externally rotated and displaced laterally. Distal pulses are strong and  capillary refill is prompt. Sensation is symmetric relative to the other side. A small abrasion is present over the medial malleolus with out any skin puncture. Skin is warm and dry without rash. Neurologic: Mental status is normal, cranial nerves are intact, there are no motor deficits. Decreased sensation is present in both legs with normal sensation beginning at about midcalf consistent with diabetic peripheral neuropathy.  ED Course  Procedures (including critical care time)  Results for orders placed during the hospital encounter of 01/08/13  GLUCOSE, CAPILLARY      Result Value Range   Glucose-Capillary 190 (*) 70 - 99 mg/dL  CBC WITH DIFFERENTIAL      Result Value Range   WBC 10.2  4.0 - 10.5 K/uL   RBC  3.60 (*) 3.87 - 5.11 MIL/uL   Hemoglobin 9.5 (*) 12.0 - 15.0 g/dL   HCT 28.4 (*) 36.0 - 46.0 %   MCV 78.9  78.0 - 100.0 fL   MCH 26.4  26.0 - 34.0 pg   MCHC 33.5  30.0 - 36.0 g/dL   RDW 13.2  11.5 - 15.5 %   Platelets 344  150 - 400 K/uL   Neutrophils Relative 84 (*) 43 - 77 %   Neutro Abs 8.5 (*) 1.7 - 7.7 K/uL   Lymphocytes Relative 10 (*) 12 - 46 %   Lymphs Abs 1.0  0.7 - 4.0 K/uL   Monocytes Relative 4  3 - 12 %   Monocytes Absolute 0.4  0.1 - 1.0 K/uL   Eosinophils Relative 2  0 - 5 %   Eosinophils Absolute 0.2  0.0 - 0.7 K/uL   Basophils Relative 1  0 - 1 %   Basophils Absolute 0.1  0.0 - 0.1 K/uL  PROTIME-INR      Result Value Range   Prothrombin Time 13.7  11.6 - 15.2 seconds   INR 1.06  0.00 - 1.49  APTT      Result Value Range   aPTT 29  24 - 37 seconds  TROPONIN I      Result Value Range   Troponin I <0.30  <0.30 ng/mL  TSH      Result Value Range   TSH 0.747  0.350 - 4.500 uIU/mL  GLUCOSE, CAPILLARY      Result Value Range   Glucose-Capillary 251 (*) 70 - 99 mg/dL   Comment 1 MD NOTIFIED    POCT I-STAT, CHEM 8      Result Value Range   Sodium 141  135 - 145 mEq/L   Potassium 4.4  3.5 - 5.1 mEq/L   Chloride 110  96 - 112 mEq/L   BUN 22  6 - 23 mg/dL   Creatinine, Ser 1.80 (*) 0.50 - 1.10 mg/dL   Glucose, Bld 212 (*) 70 - 99 mg/dL   Calcium, Ion 1.14  1.12 - 1.23 mmol/L   TCO2 21  0 - 100 mmol/L   Hemoglobin 9.9 (*) 12.0 - 15.0 g/dL   HCT 29.0 (*) 36.0 - 46.0 %   Dg Chest 1 View  01/08/2013  *RADIOLOGY REPORT*  Clinical Data: Preoperative respiratory examination for right ankle surgery/fracture.  CHEST - 1 VIEW  Comparison: 10/09/2011 and 07/25/2011.  Findings: There are low lung volumes with mild resulting bibasilar atelectasis.  Mild cardiomegaly appears stable.  There is no edema, confluent airspace opacity, pleural effusion or pneumothorax.  No fractures are identified.  Telemetry leads overlie the chest.  IMPRESSION: Low lung volumes with bibasilar  atelectasis (especially  on the left).  Stable cardiomegaly.   Original Report Authenticated By: Richardean Sale, M.D.    Dg Ankle 2 Views Right  01/08/2013  *RADIOLOGY REPORT*  Clinical Data: Reduction of fracture dislocation.  RIGHT ANKLE - 2 VIEW  Comparison: Plain films 01/08/2013 and 11:59 a.m.  Findings: The patient is in a new fiberglass cast.  Position and alignment of the tibiotalar joint is improved with widening of the medial clear space persisting.  Position and alignment of a distal fibular fracture is also improved.  No new abnormality is identified.  IMPRESSION: Improved position and alignment of fracture dislocation.  No new abnormality.   Original Report Authenticated By: Orlean Patten, M.D.    Dg Ankle Complete Right  01/08/2013  *RADIOLOGY REPORT*  Clinical Data: Ankle pain and deformity status post fall.  RIGHT ANKLE - COMPLETE 3+ VIEW  Comparison: None.  Findings: There is an oblique fracture of the distal fibular diaphysis associated with moderate posterolateral displacement. There is widening of the ankle mortise with lateral subluxation of the talus by approximately 1.5 cm.  There is likely a fracture of the posterior lateral aspect of the tibial plafond.  No talar dome fracture is identified.  A plantar calcaneal spur is noted.  IMPRESSION: Right ankle fracture dislocation as described.   Original Report Authenticated By: Richardean Sale, M.D.    Ct Head Wo Contrast  01/08/2013  *RADIOLOGY REPORT*  Clinical Data: Status post fall.  Possible syncopal episode.  CT HEAD WITHOUT CONTRAST  Technique:  Contiguous axial images were obtained from the base of the skull through the vertex without contrast.  Comparison: Head CT 10/09/2011.  Findings: There is no evidence of acute intracranial hemorrhage, mass lesion, brain edema or extra-axial fluid collection.  Mild periventricular white matter disease appears stable with the exception of new decreased density in the right lentiform nucleus  on image 13.  No cortical based infarct or hydrocephalus is identified.  There is a probable mucous retention cyst within the left maxillary sinus.  The visualized paranasal sinuses, mastoid air cells and middle ears are otherwise clear.  There is no evidence of calvarial fracture.  IMPRESSION:  1.  New ill-defined low density in the right lentiform nucleus, likely an age indeterminate small vessel stroke. 2.  Otherwise stable examination.  No acute post-traumatic findings identified.   Original Report Authenticated By: Richardean Sale, M.D.      Images viewed by me.    Date: 01/08/2013  Rate: 133  Rhythm: atrial fibrillation  QRS Axis: normal  Intervals: QT prolonged  ST/T Wave abnormalities: ST depression in inferior and anterolateral leads  Conduction Disutrbances:none  Narrative Interpretation: Atrial fibrillation with rapid ventricular response, prolonged QTc interval, and ST depression in inferior and anterolateral leads which may be rate related. When compared with ECG of 10/09/2011, atrial fibrillation with rapid ventricular response has replaced sinus rhythm, QTc has lengthened, ST depression is now present and is probably rate related.  Old EKG Reviewed: changes noted   Date: 01/08/2013 1406  Rate: 63  Rhythm: normal sinus rhythm  QRS Axis: normal  Intervals: normal  ST/T Wave abnormalities: normal  Conduction Disutrbances:none  Narrative Interpretation: Normal ECG. When compared with ECG of earlier today, atrial fibrillation with rapid ventricular response has converted to sinus rhythm, ST depression has resolved indicating that it was do to rapid rate.  Old EKG Reviewed: changes noted      1. Atrial fibrillation with rapid ventricular response   2. Syncope   3. Fracture dislocation  of ankle, right, initial encounter    CRITICAL CARE Performed by: [id number]   Total critical care time: 50 minutes  Critical care time was exclusive of separately billable procedures  and treating other patients.  Critical care was necessary to treat or prevent imminent or life-threatening deterioration.  Critical care was time spent personally by me on the following activities: development of treatment plan with patient and/or surrogate as well as nursing, discussions with consultants, evaluation of patient's response to treatment, examination of patient, obtaining history from patient or surrogate, ordering and performing treatments and interventions, ordering and review of laboratory studies, ordering and review of radiographic studies, pulse oximetry and re-evaluation of patient's condition.   MDM  Syncope with new onset atrial fibrillation associated with fracture dislocation of the right ankle. The ankle need to be reduced and splinted. Dr. Ninfa Linden has been consulted for definitive management of ankle fracture. If you have fibrillation will need to be treated with anticoagulation since the patient was not aware of her heart rhythm and we don't know how long she has been in atrial fibrillation. She will be given diltiazem for rate control since she is only on a beta blocker. Old records are reviewed and she has a history of having a thyroid nodule being biopsied and it is noted that her last TSH was normal in 2012, but in 2010 it had been low. TSH will be repeated today.  Ankle dislocation was reduced by manipulation and foot was supported. Orthopedic technician was paged to apply splint. However, there is a delay in the orthopedic technician coming down, and the ankle dislocation was unstable in the reduced position. Dr. Ninfa Linden of orthopedics was consulted and when he arrived, the ankle had dislocated again. He reduced the ankle and applied a splint. Cardiology will be consulted for admission for new onset atrial fibrillation and syncope.  She was given diltiazem for rate control and rate came down to 95-105. After that, she spontaneously converted to sinus  rhythm.  Cardiology has seen the patient and feel that she would be best admitted to her primary care service. Case has been discussed with the family practice service and they agreed to admit the patient.  Delora Fuel, MD [id number]

## 2013-01-08 NOTE — H&P (Signed)
[name] History and Physical  Patient name: [name] Medical record number: [id number] Date of birth: Apr 24, 1956 Age: 57 y.o. Gender: female  Primary Care Provider: Delman Cheadle, MD  Chief Complaint: syncope History of Present Illness: [name] is a 57 y.o. year old female with hypertension and diabetes with neuropathy who presented after a syncopal episode today which resulted in a fracture right ankle. According to the patient, she awoke this morning with a global headache, slight pressure in the center of her chest and nausea. She was walking to the bathroom to vomit at had a syncopal event. It was witnessed by her son with whom she lives. She struck the back of her head on her dresser, but awoke almost immediately to realize a deformity of her right ankle. Therefore, EMS was called. Upon arrival to the ED, the patient was found to have a fracture of the right ankle and also had atrial fibrillation with RVR to the 130's. She was placed on a cardizem drip and heparin infusion for the atrial fibrillation, and the ankle was reduced and splinted. Her heart rate converted spontaneously to sinus rhythm prior to my arrival in the ED.   Currently, the patient denies any true chest pain or feeling palpitation this morning. She denies a history of A-Fib, CAD, CHF, or MI. She denies shortness of breath. She also denies preceding weakness, difficulty walking, speaking or swallowing, She denies any history of stroke.   She has a history of hypertension, for which she was taking carvedilol, but ran out of chlorthalidone and amlodipine. She is taking Lantus and Novolin for her diabetes. She previously went to [facility], but has not seen a physician since May 2013. She had an appointment scheduled today for her new primary care physician at the new [facility].   Patient Active Problem List  Diagnosis  . Paroxysmal atrial  fibrillation, rapid ventricular response  . Hypertension  . Diabetes mellitus  . Chronic kidney disease, stage III (moderate)  . Syncope  . History of stroke  . Diabetic neuropathy   Past Medical History: Past Medical History  Diagnosis Date  . Hypertension   . Diabetes mellitus   . Chronic kidney disease, stage III (moderate)     Past Surgical History: Past Surgical History  Procedure Laterality Date  . Laser surgery for diabetic retinopathy    . Abdominal hysterectomy      Social History: History   Social History  . Marital Status: Legally Separated    Spouse Name: N/A    Number of Children: N/A  . Years of Education: N/A   Occupational History  . Disabled    Social History Main Topics  . Smoking status: Never Smoker   . Smokeless tobacco: None  . Alcohol Use: No  . Drug Use:   . Sexually Active:    Other Topics Concern  . None   Social History Narrative   Patient lives with her son..    Family History: Family History  Problem Relation Age of Onset  . Hypertension Father     Also had CAD  . Diabetes Mother     No history CAD    Allergies: Allergies  Allergen Reactions  . Lisinopril Other (See Comments)    coughing    No current facility-administered medications on file prior to encounter.   Current Outpatient Prescriptions on File Prior to Encounter  Medication Sig Dispense Refill  . insulin glargine (LANTUS) 100  UNIT/ML injection Inject 30 Units into the skin at bedtime.      . insulin regular (NOVOLIN R,HUMULIN R) 100 units/mL injection Inject 10-20 Units into the skin 3 (three) times daily before meals. Sliding scale of insulin dosage       Carvedilol 6.25 mg BID Atorvastatin 40 mg daily     Review Of Systems: Per HPI with the following additions: none Otherwise 12 point review of systems was performed and was unremarkable.  Physical Exam: Temp:  [97.8 F (36.6 C)-98.1 F (36.7 C)] 98.1 F (36.7 C) (04/17 2035) Pulse Rate:   [62-123] 62 (04/17 2035) Resp:  [13-23] 18 (04/17 2035) BP: (137-189)/(61-90) 189/77 mmHg (04/17 2035) SpO2:  [95 %-100 %] 97 % (04/17 2035) Weight:  [240 lb (108.863 kg)-246 lb 11.2 oz (111.902 kg)] 246 lb 11.2 oz (111.902 kg) (04/17 2035)   General:  Elderly AAF, obese body habitus, non distressed lying on stretcher, pleasant and conversant  HEENT: PERRLA, extra ocular movement intact, sclera clear, anicteric and oropharynx clear, no lesions, keloid on left ear  Heart: S1, S2 normal, no murmur, rub or gallop, regular rate and rhythm Lungs: clear to auscultation, no wheezes or rales and unlabored breathing Abdomen: abdomen is soft without significant tenderness, masses, organomegaly or guarding Extremities: right ankle in splint  Back/Neck: normal ROM of neck, no spinous process deformity or tenderness  Skin:no rashes, no ecchymoses, no petechiae Neurology: mental status, speech normal, alert and oriented x3 and PERLA, decreased sensation to light touch on left foot   Labs and Imaging:  Results for orders placed during the hospital encounter of 01/08/13 (from the past 24 hour(s))  GLUCOSE, CAPILLARY     Status: Abnormal   Collection Time    01/08/13 11:46 AM      Result Value Range   Glucose-Capillary 190 (*) 70 - 99 mg/dL  CBC WITH DIFFERENTIAL     Status: Abnormal   Collection Time    01/08/13 12:16 PM      Result Value Range   WBC 10.2  4.0 - 10.5 K/uL   RBC 3.60 (*) 3.87 - 5.11 MIL/uL   Hemoglobin 9.5 (*) 12.0 - 15.0 g/dL   HCT 28.4 (*) 36.0 - 46.0 %   MCV 78.9  78.0 - 100.0 fL   MCH 26.4  26.0 - 34.0 pg   MCHC 33.5  30.0 - 36.0 g/dL   RDW 13.2  11.5 - 15.5 %   Platelets 344  150 - 400 K/uL   Neutrophils Relative 84 (*) 43 - 77 %   Neutro Abs 8.5 (*) 1.7 - 7.7 K/uL   Lymphocytes Relative 10 (*) 12 - 46 %   Lymphs Abs 1.0  0.7 - 4.0 K/uL   Monocytes Relative 4  3 - 12 %   Monocytes Absolute 0.4  0.1 - 1.0 K/uL   Eosinophils Relative 2  0 - 5 %   Eosinophils Absolute  0.2  0.0 - 0.7 K/uL   Basophils Relative 1  0 - 1 %   Basophils Absolute 0.1  0.0 - 0.1 K/uL  PROTIME-INR     Status: None   Collection Time    01/08/13 12:16 PM      Result Value Range   Prothrombin Time 13.7  11.6 - 15.2 seconds   INR 1.06  0.00 - 1.49  APTT     Status: None   Collection Time    01/08/13 12:16 PM      Result Value Range  aPTT 29  24 - 37 seconds  TSH     Status: None   Collection Time    01/08/13 12:18 PM      Result Value Range   TSH 0.747  0.350 - 4.500 uIU/mL  TROPONIN I     Status: None   Collection Time    01/08/13 12:21 PM      Result Value Range   Troponin I <0.30  <0.30 ng/mL  POCT I-STAT, CHEM 8     Status: Abnormal   Collection Time    01/08/13 12:42 PM      Result Value Range   Sodium 141  135 - 145 mEq/L   Potassium 4.4  3.5 - 5.1 mEq/L   Chloride 110  96 - 112 mEq/L   BUN 22  6 - 23 mg/dL   Creatinine, Ser 1.80 (*) 0.50 - 1.10 mg/dL   Glucose, Bld 212 (*) 70 - 99 mg/dL   Calcium, Ion 1.14  1.12 - 1.23 mmol/L   TCO2 21  0 - 100 mmol/L   Hemoglobin 9.9 (*) 12.0 - 15.0 g/dL   HCT 29.0 (*) 36.0 - 46.0 %  GLUCOSE, CAPILLARY     Status: Abnormal   Collection Time    01/08/13  6:12 PM      Result Value Range   Glucose-Capillary 251 (*) 70 - 99 mg/dL   Comment 1 MD NOTIFIED    HEPARIN LEVEL (UNFRACTIONATED)     Status: None   Collection Time    01/08/13  7:19 PM      Result Value Range   Heparin Unfractionated 0.53  0.30 - 0.70 IU/mL  GLUCOSE, CAPILLARY     Status: Abnormal   Collection Time    01/08/13  8:38 PM      Result Value Range   Glucose-Capillary 237 (*) 70 - 99 mg/dL   Comment 1 Notify RN      Dg Chest 1 View  01/08/2013  *RADIOLOGY REPORT*  Clinical Data: Preoperative respiratory examination for right ankle surgery/fracture.  CHEST - 1 VIEW  Comparison: 10/09/2011 and 07/25/2011.  Findings: There are low lung volumes with mild resulting bibasilar atelectasis.  Mild cardiomegaly appears stable.  There is no edema, confluent  airspace opacity, pleural effusion or pneumothorax.  No fractures are identified.  Telemetry leads overlie the chest.  IMPRESSION: Low lung volumes with bibasilar atelectasis (especially on the left).  Stable cardiomegaly.   Original Report Authenticated By: Richardean Sale, M.D.    Dg Ankle 2 Views Right  01/08/2013  *RADIOLOGY REPORT*  Clinical Data: Reduction of fracture dislocation.  RIGHT ANKLE - 2 VIEW  Comparison: Plain films 01/08/2013 and 11:59 a.m.  Findings: The patient is in a new fiberglass cast.  Position and alignment of the tibiotalar joint is improved with widening of the medial clear space persisting.  Position and alignment of a distal fibular fracture is also improved.  No new abnormality is identified.  IMPRESSION: Improved position and alignment of fracture dislocation.  No new abnormality.   Original Report Authenticated By: Orlean Patten, M.D.    Dg Ankle Complete Right  01/08/2013  *RADIOLOGY REPORT*  Clinical Data: Ankle pain and deformity status post fall.  RIGHT ANKLE - COMPLETE 3+ VIEW  Comparison: None.  Findings: There is an oblique fracture of the distal fibular diaphysis associated with moderate posterolateral displacement. There is widening of the ankle mortise with lateral subluxation of the talus by approximately 1.5 cm.  There is likely a fracture of  the posterior lateral aspect of the tibial plafond.  No talar dome fracture is identified.  A plantar calcaneal spur is noted.  IMPRESSION: Right ankle fracture dislocation as described.   Original Report Authenticated By: Richardean Sale, M.D.    Ct Head Wo Contrast  01/08/2013  *RADIOLOGY REPORT*  Clinical Data: Status post fall.  Possible syncopal episode.  CT HEAD WITHOUT CONTRAST  Technique:  Contiguous axial images were obtained from the base of the skull through the vertex without contrast.  Comparison: Head CT 10/09/2011.  Findings: There is no evidence of acute intracranial hemorrhage, mass lesion, brain edema or  extra-axial fluid collection.  Mild periventricular white matter disease appears stable with the exception of new decreased density in the right lentiform nucleus on image 13.  No cortical based infarct or hydrocephalus is identified.  There is a probable mucous retention cyst within the left maxillary sinus.  The visualized paranasal sinuses, mastoid air cells and middle ears are otherwise clear.  There is no evidence of calvarial fracture.  IMPRESSION:  1.  New ill-defined low density in the right lentiform nucleus, likely an age indeterminate small vessel stroke. 2.  Otherwise stable examination.  No acute post-traumatic findings identified.   Original Report Authenticated By: Richardean Sale, M.D.      Date: 01/08/2013  Rate: 63  Rhythm: normal sinus rhythm  QRS Axis: normal  Intervals: normal  ST/T Wave abnormalities: normal  Conduction Disutrbances:none  Narrative Interpretation:   Old EKG Reviewed: Atrial fibrillation resolved from earlier ECG     Assessment and Plan: [name] is a 57 y.o. year old female with a PMH of hypertension, diabetes, and severe diabetic neuropathy presenting with atrial fibrillation with RVR and a fractured right ankle after a syncopal epidsode today.   # Ankle Fracture - Oblique fracture of distal fibula, s/p reduction and splinting; Occurred 01/08/13 - Plan for surgery by Dr. Rush Farmer on Saturday 01/10/13, unless medically contraindicated - Maintain elevated with ice, non weight bearing - Tylenol and oxycodone for pain PRN   # Atrial Fibrillation with RVR - Noted  A fib on first EKG in ED, given CHADS Score of 4, patient placed on heparin drip and diltiazem dripped; Converted to sinus rhythm - Convert from diltiazem drip to PO diltiazem 30 mg q 6 followed by 120 mg daily per recommendations from cardiology team - Maintain Heparin drip in preparation of surgery and address anticoagulation needs thereafter - Telemetry - ACS rule out with troponin and  repeat EKG in AM  - Obtain Echo   # Syncope - Unknown etiology, but likely atrial fibrillation with RVR vs other dysrhythmia vs orthostatic hypotension vs PE (Wells Score 1.5); CT head ruled out acute bleed - Treating dysrhythmia as noted above  - Cannot check orthostatic vitals given non weight bearing status, but will given IVF to improve vol status - No need for PE eval since low suspsicion and already   # Vascular Disease - Noted to have possible small vessel stroke on right lentiform nucleus which is likely old and does not correlated with current condition - Continue home dose Lipitor and recheck lipids in AM - Carotid doppler and echo to r/o possible causes of CVA  # Hypertension - Patient taking carvedilol daily, previously also on amlodipine and chlorthalidone, but ran out months ago - Start back low dose carvedilol as needed, consider restarting other meds if necessary   # DM, type 2 with Neurologic Deficits -  - Check A1C - Start  Lantus at 20U (home dose 30) and moderate sliding scale  # CKD Stage III - Creat 1.8 at presentation - F/u AM creatinine and GFR - Avoid nephrotoxic agents   # Anemia, Normocytic - Chronic, Hgb baseline 10; 9.9 on presentation - F/u daily CBC   FENGI - Carb modified diet, NS IVF at 75  PPX - On heparin drip DISPO - Admit to telemetry bed under care of Family Medicine Teaching Service   Marena Chancy. Maricela Bo, MD, MBA 01/08/2013, 9:48 PM Family Medicine Resident, PGY-2 [phone] pager

## 2013-01-08 NOTE — ED Notes (Signed)
Dr. Ninfa Linden in to see patient.  He has decided to reset her right ankle.  Ortho tech called.

## 2013-01-08 NOTE — ED Notes (Signed)
Pt up to bedside commode - required minimal assistance.

## 2013-01-08 NOTE — Progress Notes (Signed)
Orthopedic Tech Progress Note Patient Details:  [name] June 12, 1956 [id number] L and U splint applied to Right ankle with assistance of doctor. Application tolerated well.  Ortho Devices Type of Ortho Device: Ankle splint Ortho Device/Splint Location: L&U splint to Right LE Ortho Device/Splint Interventions: Application   Asia R Thompson 01/08/2013, 1:48 PM

## 2013-01-08 NOTE — Progress Notes (Signed)
ANTICOAGULATION CONSULT NOTE - Follow Up Consult  Pharmacy Consult for Heparin Indication: atrial fibrillation  Allergies  Allergen Reactions  . Lisinopril Other (See Comments)    coughing    Patient Measurements: Height: 5\' 7"  (170.2 cm) Weight: 240 lb (108.863 kg) IBW/kg (Calculated) : 61.6 Heparin Dosing Weight:    Vital Signs: Temp: 98 F (36.7 C) (04/17 1943) Temp src: Oral (04/17 1943) BP: 182/61 mmHg (04/17 1943) Pulse Rate: 65 (04/17 1943)  Labs:  Recent Labs  01/08/13 1216 01/08/13 1221 01/08/13 1242 01/08/13 1919  HGB 9.5*  --  9.9*  --   HCT 28.4*  --  29.0*  --   PLT 344  --   --   --   APTT 29  --   --   --   LABPROT 13.7  --   --   --   INR 1.06  --   --   --   HEPARINUNFRC  --   --   --  0.53  CREATININE  --   --  1.80*  --   TROPONINI  --  <0.30  --   --     Estimated Creatinine Clearance: 43.8 ml/min (by C-G formula based on Cr of 1.8).   Assessment: 57 y.o. female s/p fall at home from syncope (R ankle injury). Pt with new onset afib with RVR.Heparin started in ER with first heparin level 0.53 in goal range.  Goal of Therapy:  Heparin level 0.3-0.7 units/ml Monitor platelets by anticoagulation protocol: Yes   Plan:  Continue IV heparin at 1300 units/hr and check next level in am.  Wayland Salinas 01/08/2013,8:35 PM

## 2013-01-08 NOTE — ED Notes (Signed)
Right ankle reset and splint reapplied by Dr. Ninfa Linden and ortho tech

## 2013-01-08 NOTE — Progress Notes (Signed)
ANTICOAGULATION CONSULT NOTE - Initial Consult  Pharmacy Consult for Heparin Indication: atrial fibrillation  Allergies  Allergen Reactions  . Lisinopril Other (See Comments)    coughing    Patient Measurements: Height: 5\' 7"  (170.2 cm) Weight: 240 lb (108.863 kg) IBW/kg (Calculated) : 61.6 Heparin Dosing Weight: 86 kg  Vital Signs: Temp: 97.8 F (36.6 C) (04/17 1131) Temp src: Oral (04/17 1131) BP: 142/90 mmHg (04/17 1131) Pulse Rate: 123 (04/17 1131)  Labs: No results found for this basename: HGB, HCT, PLT, APTT, LABPROT, INR, HEPARINUNFRC, CREATININE, CKTOTAL, CKMB, TROPONINI,  in the last 72 hours  Estimated Creatinine Clearance: 47.2 ml/min (by C-G formula based on Cr of 1.67).   Medical History: Past Medical History  Diagnosis Date  . Hypertension   . Diabetes mellitus     Medications:  Home meds: atorvastatin, carvedilol, lantus, SSI  Assessment: 57 y.o. female s/p fall at home due to possibly passing out. Pt with new onset afib with RVR. To begin heparin gtt. Baseline INR, CBC pending.  Goal of Therapy:  Heparin level 0.3-0.7 units/ml Monitor platelets by anticoagulation protocol: Yes   Plan:  1. Heparin bolus 4000 units IV 2. Heparin gtt at 1300 units/hr. 3. Will f/u 6 hr heparin level 4. F/u daily heparin level and CBC  Sherlon Handing, PharmD, BCPS Clinical pharmacist, pager [phone] 01/08/2013,12:14 PM

## 2013-01-08 NOTE — ED Notes (Addendum)
Pt noted to be in Sinus Rhythm, EKG run and given to Dr Roxanne Mins to review

## 2013-01-08 NOTE — ED Notes (Signed)
Taken via transporter for xray

## 2013-01-08 NOTE — ED Notes (Addendum)
Back in room - placed back on monitor. Md In room with pt,

## 2013-01-09 ENCOUNTER — Encounter (HOSPITAL_COMMUNITY)
Admission: EM | Disposition: A | Discharge status: Home Health | Payer: Self-pay | Source: Home / Self Care | Attending: Family Medicine

## 2013-01-09 ENCOUNTER — Encounter (HOSPITAL_COMMUNITY): Payer: Self-pay | Admitting: Surgery

## 2013-01-09 DIAGNOSIS — E119 Type 2 diabetes mellitus without complications: Secondary | ICD-10-CM

## 2013-01-09 DIAGNOSIS — I2510 Atherosclerotic heart disease of native coronary artery without angina pectoris: Secondary | ICD-10-CM

## 2013-01-09 DIAGNOSIS — I252 Old myocardial infarction: Secondary | ICD-10-CM | POA: Diagnosis present

## 2013-01-09 DIAGNOSIS — I379 Nonrheumatic pulmonary valve disorder, unspecified: Secondary | ICD-10-CM

## 2013-01-09 HISTORY — PX: LEFT HEART CATHETERIZATION WITH CORONARY ANGIOGRAM: SHX5451

## 2013-01-09 LAB — CBC
Hemoglobin: 8.8 g/dL — ABNORMAL LOW (ref 12.0–15.0)
MCHC: 33.5 g/dL (ref 30.0–36.0)
Platelets: 355 10*3/uL (ref 150–400)
RDW: 13.4 % (ref 11.5–15.5)

## 2013-01-09 LAB — HEMOGLOBIN A1C
Hgb A1c MFr Bld: 7 % — ABNORMAL HIGH (ref <5.7)
Mean Plasma Glucose: 154 mg/dL — ABNORMAL HIGH (ref <117)

## 2013-01-09 LAB — LIPID PANEL
HDL: 40 mg/dL (ref >39)
LDL Cholesterol: 135 mg/dL — ABNORMAL HIGH (ref 0–99)
Total CHOL/HDL Ratio: 5.1 RATIO
Triglycerides: 147 mg/dL (ref <150)

## 2013-01-09 LAB — COMPREHENSIVE METABOLIC PANEL
ALT: 16 U/L (ref 0–35)
Albumin: 2.8 g/dL — ABNORMAL LOW (ref 3.5–5.2)
Calcium: 8.6 mg/dL (ref 8.4–10.5)
GFR calc Af Amer: 31 mL/min — ABNORMAL LOW (ref >90)
Glucose, Bld: 218 mg/dL — ABNORMAL HIGH (ref 70–99)
Sodium: 138 mEq/L (ref 135–145)
Total Protein: 6.7 g/dL (ref 6.0–8.3)

## 2013-01-09 LAB — GLUCOSE, CAPILLARY
Glucose-Capillary: 175 mg/dL — ABNORMAL HIGH (ref 70–99)
Glucose-Capillary: 157 mg/dL — ABNORMAL HIGH (ref 70–99)
Glucose-Capillary: 179 mg/dL — ABNORMAL HIGH (ref 70–99)

## 2013-01-09 LAB — HEPARIN LEVEL (UNFRACTIONATED): Heparin Unfractionated: 0.43 IU/mL (ref 0.30–0.70)

## 2013-01-09 SURGERY — CANCELLED PROCEDURE
Laterality: Right

## 2013-01-09 SURGERY — LEFT HEART CATHETERIZATION WITH CORONARY ANGIOGRAM
Anesthesia: LOCAL

## 2013-01-09 MED ORDER — MIDAZOLAM HCL 2 MG/2ML IJ SOLN
INTRAMUSCULAR | Status: AC
  Filled 2013-01-09: qty 2

## 2013-01-09 MED ORDER — FENTANYL CITRATE 0.05 MG/ML IJ SOLN
INTRAMUSCULAR | Status: AC
  Filled 2013-01-09: qty 2

## 2013-01-09 MED ORDER — SODIUM CHLORIDE 0.9 % IV SOLN
INTRAVENOUS | Status: DC
  Administered 2013-01-09: 11:00:00 via INTRAVENOUS

## 2013-01-09 MED ORDER — ASPIRIN 81 MG PO CHEW: 324.0000 mg | CHEWABLE_TABLET | ORAL | Status: DC

## 2013-01-09 MED ORDER — LABETALOL HCL 5 MG/ML IV SOLN
INTRAVENOUS | Status: AC
  Filled 2013-01-09: qty 4

## 2013-01-09 MED ORDER — CARVEDILOL 3.125 MG PO TABS
3.1250 mg | ORAL_TABLET | Freq: Two times a day (BID) | ORAL | Status: DC
  Administered 2013-01-09: 3.125 mg via ORAL
  Filled 2013-01-09 (×3): qty 1

## 2013-01-09 MED ORDER — HEPARIN (PORCINE) IN NACL 100-0.45 UNIT/ML-% IJ SOLN
1300.0000 [IU]/h | INTRAMUSCULAR | Status: DC
  Administered 2013-01-10: 1300 [IU]/h via INTRAVENOUS
  Filled 2013-01-09 (×2): qty 250

## 2013-01-09 MED ORDER — NITROGLYCERIN IN D5W 200-5 MCG/ML-% IV SOLN
2.0000 ug/min | INTRAVENOUS | Status: DC
  Administered 2013-01-09: 5 ug/min via INTRAVENOUS
  Filled 2013-01-09: qty 250

## 2013-01-09 MED ORDER — CARVEDILOL 3.125 MG PO TABS
3.1250 mg | ORAL_TABLET | Freq: Two times a day (BID) | ORAL | Status: DC
  Filled 2013-01-09 (×3): qty 1

## 2013-01-09 MED ORDER — LIDOCAINE HCL (PF) 1 % IJ SOLN
INTRAMUSCULAR | Status: AC
  Filled 2013-01-09: qty 30

## 2013-01-09 MED ORDER — ASPIRIN 81 MG PO CHEW
81.0000 mg | CHEWABLE_TABLET | Freq: Every day | ORAL | Status: DC
  Administered 2013-01-09 – 2013-01-15 (×7): 81 mg via ORAL
  Filled 2013-01-09 (×6): qty 1

## 2013-01-09 MED ORDER — VERAPAMIL HCL 2.5 MG/ML IV SOLN
INTRAVENOUS | Status: AC
  Filled 2013-01-09: qty 2

## 2013-01-09 MED ORDER — ONDANSETRON HCL 4 MG/2ML IJ SOLN
INTRAMUSCULAR | Status: AC
  Filled 2013-01-09: qty 2

## 2013-01-09 MED ORDER — ONDANSETRON HCL 4 MG/2ML IJ SOLN: 4.0000 mg | Freq: Four times a day (QID) | INTRAMUSCULAR | Status: DC | PRN

## 2013-01-09 MED ORDER — SODIUM CHLORIDE 0.9 % IV SOLN: INTRAVENOUS | Status: AC

## 2013-01-09 MED ORDER — HEPARIN SODIUM (PORCINE) 1000 UNIT/ML IJ SOLN
INTRAMUSCULAR | Status: AC
  Filled 2013-01-09: qty 1

## 2013-01-09 MED ORDER — ACETAMINOPHEN 325 MG PO TABS
650.0000 mg | ORAL_TABLET | ORAL | Status: DC | PRN
  Administered 2013-01-10 – 2013-01-15 (×6): 650 mg via ORAL
  Filled 2013-01-09 (×5): qty 2

## 2013-01-09 MED ORDER — NITROGLYCERIN 0.4 MG SL SUBL
SUBLINGUAL_TABLET | SUBLINGUAL | Status: AC
  Administered 2013-01-09: 0.4 mg
  Filled 2013-01-09: qty 25

## 2013-01-09 MED ORDER — LABETALOL HCL 5 MG/ML IV SOLN
20.0000 mg | Freq: Once | INTRAVENOUS | Status: AC
  Administered 2013-01-09: 20 mg via INTRAVENOUS

## 2013-01-09 MED ORDER — ISOSORBIDE MONONITRATE ER 30 MG PO TB24
30.0000 mg | ORAL_TABLET | Freq: Every day | ORAL | Status: DC
  Administered 2013-01-10 – 2013-01-15 (×6): 30 mg via ORAL
  Filled 2013-01-09 (×7): qty 1

## 2013-01-09 MED ORDER — DEXTROSE 5 % IV SOLN
3.0000 g | INTRAVENOUS | Status: DC
  Filled 2013-01-09: qty 3000

## 2013-01-09 MED ORDER — SODIUM CHLORIDE 0.9 % IV SOLN: 1.0000 mL/kg/h | INTRAVENOUS | Status: DC

## 2013-01-09 MED ORDER — SODIUM CHLORIDE 0.9 % IV SOLN: 250.0000 mL | INTRAVENOUS | Status: DC | PRN

## 2013-01-09 MED ORDER — CARVEDILOL 6.25 MG PO TABS
6.2500 mg | ORAL_TABLET | Freq: Two times a day (BID) | ORAL | Status: DC
Start: 2013-01-09 — End: 2013-01-09
  Administered 2013-01-09: 6.25 mg via ORAL
  Filled 2013-01-09 (×2): qty 1

## 2013-01-09 MED ORDER — HEPARIN (PORCINE) IN NACL 2-0.9 UNIT/ML-% IJ SOLN
INTRAMUSCULAR | Status: AC
  Filled 2013-01-09: qty 1000

## 2013-01-09 MED ORDER — CARVEDILOL 6.25 MG PO TABS
6.2500 mg | ORAL_TABLET | Freq: Two times a day (BID) | ORAL | Status: DC
  Administered 2013-01-10 – 2013-01-11 (×3): 6.25 mg via ORAL
  Filled 2013-01-09 (×5): qty 1

## 2013-01-09 MED ORDER — SODIUM CHLORIDE 0.9 % IJ SOLN: 3.0000 mL | INTRAMUSCULAR | Status: DC | PRN

## 2013-01-09 MED ORDER — LABETALOL HCL 5 MG/ML IV SOLN
10.0000 mg | Freq: Once | INTRAVENOUS | Status: AC
  Administered 2013-01-09: 10 mg via INTRAVENOUS

## 2013-01-09 MED ORDER — ATORVASTATIN CALCIUM 80 MG PO TABS
80.0000 mg | ORAL_TABLET | Freq: Every day | ORAL | Status: DC
  Administered 2013-01-09 – 2013-01-14 (×6): 80 mg via ORAL
  Filled 2013-01-09 (×7): qty 1

## 2013-01-09 MED ORDER — ATORVASTATIN CALCIUM 80 MG PO TABS
80.0000 mg | ORAL_TABLET | Freq: Every day | ORAL | Status: DC
  Filled 2013-01-09: qty 1

## 2013-01-09 MED ORDER — SODIUM CHLORIDE 0.9 % IJ SOLN: 3.0000 mL | Freq: Two times a day (BID) | INTRAMUSCULAR | Status: DC

## 2013-01-09 SURGICAL SUPPLY — 39 items
BANDAGE ESMARK 6X9 LF (GAUZE/BANDAGES/DRESSINGS) IMPLANT
BNDG COHESIVE 4X5 TAN STRL (GAUZE/BANDAGES/DRESSINGS) ×2 IMPLANT
BNDG ESMARK 6X9 LF (GAUZE/BANDAGES/DRESSINGS)
BNDG GAUZE STRTCH 6 (GAUZE/BANDAGES/DRESSINGS) ×2 IMPLANT
CLOTH BEACON ORANGE TIMEOUT ST (SAFETY) ×2 IMPLANT
COVER SURGICAL LIGHT HANDLE (MISCELLANEOUS) ×2 IMPLANT
CUFF TOURNIQUET SINGLE 34IN LL (TOURNIQUET CUFF) IMPLANT
CUFF TOURNIQUET SINGLE 44IN (TOURNIQUET CUFF) IMPLANT
DRAPE C-ARM MINI 42X72 WSTRAPS (DRAPES) IMPLANT
DRAPE INCISE IOBAN 66X45 STRL (DRAPES) ×2 IMPLANT
DRAPE PROXIMA HALF (DRAPES) ×2 IMPLANT
DRAPE U-SHAPE 47X51 STRL (DRAPES) ×2 IMPLANT
DRSG ADAPTIC 3X8 NADH LF (GAUZE/BANDAGES/DRESSINGS) ×2 IMPLANT
DURAPREP 26ML APPLICATOR (WOUND CARE) ×2 IMPLANT
ELECT REM PT RETURN 9FT ADLT (ELECTROSURGICAL) ×2
ELECTRODE REM PT RTRN 9FT ADLT (ELECTROSURGICAL) ×1 IMPLANT
GLOVE BIOGEL PI IND STRL 9 (GLOVE) ×1 IMPLANT
GLOVE BIOGEL PI INDICATOR 9 (GLOVE) ×1
GLOVE SURG ORTHO 9.0 STRL STRW (GLOVE) ×2 IMPLANT
GOWN PREVENTION PLUS XLARGE (GOWN DISPOSABLE) ×2 IMPLANT
GOWN SRG XL XLNG 56XLVL 4 (GOWN DISPOSABLE) ×2 IMPLANT
GOWN STRL NON-REIN XL XLG LVL4 (GOWN DISPOSABLE) ×2
KIT BASIN OR (CUSTOM PROCEDURE TRAY) ×2 IMPLANT
KIT ROOM TURNOVER OR (KITS) ×2 IMPLANT
MANIFOLD NEPTUNE II (INSTRUMENTS) ×2 IMPLANT
NS IRRIG 1000ML POUR BTL (IV SOLUTION) ×2 IMPLANT
PACK ORTHO EXTREMITY (CUSTOM PROCEDURE TRAY) ×2 IMPLANT
PAD ARMBOARD 7.5X6 YLW CONV (MISCELLANEOUS) ×4 IMPLANT
PADDING CAST COTTON 6X4 STRL (CAST SUPPLIES) ×2 IMPLANT
SPONGE GAUZE 4X4 12PLY (GAUZE/BANDAGES/DRESSINGS) ×2 IMPLANT
SPONGE LAP 18X18 X RAY DECT (DISPOSABLE) ×2 IMPLANT
STAPLER VISISTAT 35W (STAPLE) IMPLANT
SUCTION FRAZIER TIP 10 FR DISP (SUCTIONS) ×2 IMPLANT
SUT ETHILON 2 0 PSLX (SUTURE) IMPLANT
SUT VIC AB 2-0 CTB1 (SUTURE) ×4 IMPLANT
TOWEL OR 17X24 6PK STRL BLUE (TOWEL DISPOSABLE) ×2 IMPLANT
TOWEL OR 17X26 10 PK STRL BLUE (TOWEL DISPOSABLE) ×2 IMPLANT
TUBE CONNECTING 12X1/4 (SUCTIONS) ×2 IMPLANT
WATER STERILE IRR 1000ML POUR (IV SOLUTION) ×2 IMPLANT

## 2013-01-09 NOTE — Progress Notes (Signed)
Interim Progress Note:  S: Notified by patient's RN that trop I elevated to > 2.0. Patient awoken from sleep. States that that is having mild left sided chest discomfort which is a 2/10.   O: BP 189/77  Pulse 62  Temp(Src) 98.1 F (36.7 C) (Oral)  Resp 18  Ht 5\' 7"  (1.702 m)  Wt 246 lb 11.2 oz (111.902 kg)  BMI 38.63 kg/m2  SpO2 97% Gen: easily awoken, non distressed CV: RRR, no murmurs  A/P: 57 year old F with syncopal event earlier today causing fractured right ankle likely caused by atrial fib with RVR. Now has elevated trop i.   - given nitro SL - cont Heparin ggt - obtain EKG and call cardiology - start beta blocker   [name]. Maricela Bo, MD, MBA 01/09/2013, 4:46 AM Family Medicine Resident, PGY-2 [phone] pager

## 2013-01-09 NOTE — Progress Notes (Signed)
Troponin drawn and sent to lab.

## 2013-01-09 NOTE — Progress Notes (Signed)
CRITICAL VALUE ALERT  Critical value received: Troponin 2.16   Date of notification: 01/09/13   Time of notification:  0400  Critical value read back:yes  Nurse who received alert:  Rulon Sera    MD notified (1st page):  Dr. Maricela Bo  Time of first page:  0404  MD notified (2nd page):  Time of second page:  Responding MD:  Dr. Maricela Bo   Time MD responded:  [phone]

## 2013-01-09 NOTE — Interval H&P Note (Signed)
History and Physical Interval Note:  01/09/2013 3:02 PM  [name]  has presented today for surgery, with the diagnosis of NSTEMI  The various methods of treatment have been discussed with the patient and family. After consideration of risks, benefits and other options for treatment, the patient has consented to  Procedure(s): LEFT HEART CATHETERIZATION WITH CORONARY ANGIOGRAM (N/A) as a surgical intervention .  The patient's history has been reviewed, patient examined, no change in status, stable for surgery.  I have reviewed the patient's chart and labs.  Questions were answered to the patient's satisfaction.    Special attention given to risk of worsening renal failure and need for dialysis with contrast exposure. She agrees to proceed.   Jayme Mednick,MD 3:02 PM

## 2013-01-09 NOTE — Progress Notes (Signed)
FMTS Attending  Note: Dorcas Mcmurray MD See my detailed separate note regarding these issues. Agree with above as well.

## 2013-01-09 NOTE — Progress Notes (Signed)
S: patient's chest discomfort improved with nitro, but caused headache   - repeat EKG shows no signs of active ischemia - Spoke with Cardiology fellow, Dr. Claiborne Billings, who recommends continued medical management - Given carvedilol now - Make NPO  [name]. Connie Bo, MD, MBA 01/09/2013, 5:36 AM Family Medicine Resident, PGY-2 [phone] pager

## 2013-01-09 NOTE — H&P (View-Only) (Signed)
Patient ID: [name], female   DOB: 1955/11/11, 57 y.o.   MRN: [id number]    SUBJECTIVE: Patient went out of atrial fibrillation in the ER yesterday. She had further chest pain last night and repeat TnI was 2.1.  She had chest pain again around 5 am that resolved with NTG.  Pending next set of cardiac enzymes.  She is now CP-free.  BP is running high.   Marland Kitchen aspirin  81 mg Oral Daily  . [MAR HOLD] atorvastatin  40 mg Oral QHS  . carvedilol  6.25 mg Oral BID WC  .  ceFAZolin (ANCEF) IV  3 g Intravenous On Call to OR  . [facility] HOLD] diltiazem  120 mg Oral Daily  . [MAR HOLD] insulin aspart  0-15 Units Subcutaneous TID WC  . [MAR HOLD] insulin glargine  15 Units Subcutaneous QHS  . ondansetron      . [facility] HOLD] sodium chloride  3 mL Intravenous Q12H      Filed Vitals:   01/08/13 2035 01/09/13 0520 01/09/13 0602 01/09/13 0844  BP: 189/77 198/73 198/73 178/80  Pulse: 62 66 64 68  Temp: 98.1 F (36.7 C)  99 F (37.2 C) 98.5 F (36.9 C)  TempSrc: Oral  Oral Oral  Resp: 18  18 18   Height: 5\' 7"  (1.702 m)     Weight: 246 lb 11.2 oz (111.902 kg)     SpO2: 97%  92% 96%    Intake/Output Summary (Last 24 hours) at 01/09/13 1018 Last data filed at 01/09/13 0602  Gross per 24 hour  Intake      0 ml  Output   1000 ml  Net  -1000 ml    LABS: Basic Metabolic Panel:  Recent Labs  01/08/13 1242 01/09/13 0248  NA 141 138  K 4.4 4.4  CL 110 108  CO2  --  21  GLUCOSE 212* 218*  BUN 22 24*  CREATININE 1.80* 2.01*  CALCIUM  --  8.6   Liver Function Tests:  Recent Labs  01/09/13 0248  AST 22  ALT 16  ALKPHOS 89  BILITOT 0.2*  PROT 6.7  ALBUMIN 2.8*   No results found for this basename: LIPASE, AMYLASE,  in the last 72 hours CBC:  Recent Labs  01/08/13 1216 01/08/13 1242 01/09/13 0248  WBC 10.2  --  12.6*  NEUTROABS 8.5*  --   --   HGB 9.5* 9.9* 8.8*  HCT 28.4* 29.0* 26.3*  MCV 78.9  --  79.2  PLT 344  --  355   Cardiac Enzymes:  Recent Labs   01/08/13 1221 01/09/13 0248  TROPONINI <0.30 2.16*   BNP: No components found with this basename: POCBNP,  D-Dimer: No results found for this basename: DDIMER,  in the last 72 hours Hemoglobin A1C: No results found for this basename: HGBA1C,  in the last 72 hours Fasting Lipid Panel:  Recent Labs  01/09/13 0248  CHOL 204*  HDL 40  LDLCALC 135*  TRIG 147  CHOLHDL 5.1   Thyroid Function Tests:  Recent Labs  01/08/13 1218  TSH 0.747   Anemia Panel: No results found for this basename: VITAMINB12, FOLATE, FERRITIN, TIBC, IRON, RETICCTPCT,  in the last 72 hours  RADIOLOGY: Dg Chest 1 View  01/08/2013  *RADIOLOGY REPORT*  Clinical Data: Preoperative respiratory examination for right ankle surgery/fracture.  CHEST - 1 VIEW  Comparison: 10/09/2011 and 07/25/2011.  Findings: There are low lung volumes with mild resulting bibasilar atelectasis.  Mild cardiomegaly appears  stable.  There is no edema, confluent airspace opacity, pleural effusion or pneumothorax.  No fractures are identified.  Telemetry leads overlie the chest.  IMPRESSION: Low lung volumes with bibasilar atelectasis (especially on the left).  Stable cardiomegaly.   Original Report Authenticated By: Richardean Sale, M.D.    Dg Ankle 2 Views Right  01/08/2013  *RADIOLOGY REPORT*  Clinical Data: Reduction of fracture dislocation.  RIGHT ANKLE - 2 VIEW  Comparison: Plain films 01/08/2013 and 11:59 a.m.  Findings: The patient is in a new fiberglass cast.  Position and alignment of the tibiotalar joint is improved with widening of the medial clear space persisting.  Position and alignment of a distal fibular fracture is also improved.  No new abnormality is identified.  IMPRESSION: Improved position and alignment of fracture dislocation.  No new abnormality.   Original Report Authenticated By: Orlean Patten, M.D.    Dg Ankle Complete Right  01/08/2013  *RADIOLOGY REPORT*  Clinical Data: Ankle pain and deformity status post  fall.  RIGHT ANKLE - COMPLETE 3+ VIEW  Comparison: None.  Findings: There is an oblique fracture of the distal fibular diaphysis associated with moderate posterolateral displacement. There is widening of the ankle mortise with lateral subluxation of the talus by approximately 1.5 cm.  There is likely a fracture of the posterior lateral aspect of the tibial plafond.  No talar dome fracture is identified.  A plantar calcaneal spur is noted.  IMPRESSION: Right ankle fracture dislocation as described.   Original Report Authenticated By: Richardean Sale, M.D.    Ct Head Wo Contrast  01/08/2013  *RADIOLOGY REPORT*  Clinical Data: Status post fall.  Possible syncopal episode.  CT HEAD WITHOUT CONTRAST  Technique:  Contiguous axial images were obtained from the base of the skull through the vertex without contrast.  Comparison: Head CT 10/09/2011.  Findings: There is no evidence of acute intracranial hemorrhage, mass lesion, brain edema or extra-axial fluid collection.  Mild periventricular white matter disease appears stable with the exception of new decreased density in the right lentiform nucleus on image 13.  No cortical based infarct or hydrocephalus is identified.  There is a probable mucous retention cyst within the left maxillary sinus.  The visualized paranasal sinuses, mastoid air cells and middle ears are otherwise clear.  There is no evidence of calvarial fracture.  IMPRESSION:  1.  New ill-defined low density in the right lentiform nucleus, likely an age indeterminate small vessel stroke. 2.  Otherwise stable examination.  No acute post-traumatic findings identified.   Original Report Authenticated By: Richardean Sale, M.D.     PHYSICAL EXAM General: NAD Neck: [name], no thyromegaly or thyroid nodule.  Lungs: Clear to auscultation bilaterally with normal respiratory effort. CV: Nondisplaced PMI.  Heart regular S1/S2, no S3/S4, 1/6 SEM RUSB.  Trace ankle edema.  No carotid bruit.  Normal pedal pulses.   Abdomen: Soft, nontender, no hepatosplenomegaly, no distention.  Neurologic: Alert and oriented x 3.  Psych: Normal affect. Extremities: No clubbing or cyanosis.   TELEMETRY: Reviewed telemetry pt in NSR  ASSESSMENT AND PLAN: 57 yo with history of DM, HTN, CKD (likely diabetic nephropathy) presented to ER yesterday after a syncopal episode.  She had chest pressure yesterday am then nausea.  In the setting of the nausea, she passed out and fractured her ankle.  In the ER, she was in afib with RVR but converted on diltiazem gtt to NSR.  Overnight, she had more chest pressure and was found to have troponin of  2.1.  ECG is nonacute. 1. CAD: NSTEMI.  Difficult situation as she has baseline CKD and also will need surgery on her ankle after the fracture.  However, she has been having on and off chest pain responsive to nitrates and has elevated cardiac enzymes.  I think that she will need a catheterization with possible PCI.  Will have to make decision on surgical timing based on cath results.  - LHC today => will hydrate through the rest of the morning and early afternoon with NS @ 100 cc/hr given CKD.  No LV-gram (will do echo).  Will need to minimize dye.  - NTG gtt for BP and ongoing CP episodes.  - Continue heparin gtt, will make sure she got ASA today, increase atorvastatin to 80 mg daily.  2. Atrial fibrillation: Now back in NSR.  CHADSVASC = 4.  Should likely be anticoagulated long-term but will need to formulate this plan after cath and after decision about surgical timing. Continue Coreg and diltiazem CD for now.  3. CKD: Creatinine 2 today.  She has presumed diabetic nephropathy with creatinine appearing to run 1.6-2 in the past.  Will hydrate this morning prior to cath, will minimize dye.  I did talk to her about the risk of worsening renal failure but given NSTEMI with ongoing chest pain episodes, I think risk/benefit ratio points to cath with judicious hydration and minimization of dye.  4.  Ankle fracture: Will need surgery, timiing at this point is unclear.  5. Syncope: ? Vasovagal in the setting of nausea.   Loralie Champagne 01/09/2013 10:29 AM

## 2013-01-09 NOTE — Progress Notes (Signed)
ANTICOAGULATION CONSULT NOTE - Follow Up Consult  Pharmacy Consult for heparin Indication: afib/CP  Allergies  Allergen Reactions  . Lisinopril Other (See Comments)    coughing    Patient Measurements: Height: 5\' 7"  (170.2 cm) Weight: 246 lb 11.2 oz (111.902 kg) IBW/kg (Calculated) : 61.6 Heparin Dosing Weight:   Vital Signs: Temp: 99 F (37.2 C) (04/18 1308) Temp src: Oral (04/18 1308) BP: 188/79 mmHg (04/18 1830) Pulse Rate: 80 (04/18 1806)  Labs:  Recent Labs  01/08/13 1216 01/08/13 1221 01/08/13 1242 01/08/13 1919 01/09/13 0248 01/09/13 0929  HGB 9.5*  --  9.9*  --  8.8*  --   HCT 28.4*  --  29.0*  --  26.3*  --   PLT 344  --   --   --  355  --   APTT 29  --   --   --   --   --   LABPROT 13.7  --   --   --   --   --   INR 1.06  --   --   --   --   --   HEPARINUNFRC  --   --   --  0.53 0.43  --   CREATININE  --   --  1.80*  --  2.01*  --   TROPONINI  --  <0.30  --   --  2.16* 1.66*    Estimated Creatinine Clearance: 39.8 ml/min (by C-G formula based on Cr of 2.01).   Medications:  Scheduled:  . aspirin  81 mg Oral Daily  . atorvastatin  80 mg Oral QHS  . carvedilol  6.25 mg Oral BID WC  . diltiazem  120 mg Oral Daily  . [COMPLETED] diltiazem  30 mg Oral Q6H  . [COMPLETED] fentaNYL      . [COMPLETED] heparin      . [COMPLETED] heparin      . insulin aspart  0-15 Units Subcutaneous TID WC  . insulin glargine  15 Units Subcutaneous QHS  . [COMPLETED] labetalol  10 mg Intravenous Once  . [COMPLETED] labetalol  20 mg Intravenous Once  . [COMPLETED] lidocaine (PF)      . [COMPLETED] midazolam      . [COMPLETED] nitroGLYCERIN      . ondansetron      . sodium chloride  3 mL Intravenous Q12H  . [COMPLETED] verapamil      . [DISCONTINUED] atorvastatin  40 mg Oral QHS  . [DISCONTINUED] carvedilol  3.125 mg Oral BID WC  . [DISCONTINUED] carvedilol  3.125 mg Oral BID WC  . [DISCONTINUED]  ceFAZolin (ANCEF) IV  3 g Intravenous On Call to OR   Infusions:   . sodium chloride 50 mL/hr at 01/09/13 0954  . sodium chloride 100 mL/hr at 01/09/13 1051  . sodium chloride 100 mL/hr at 01/09/13 1548  . heparin 1,300 Units/hr (01/08/13 1313)  . nitroGLYCERIN 5 mcg/min (01/09/13 1217)    Assessment: 57 yo female with afib and chest pain is s/p cath will be resumed on heparin 8hrs after sheath removal.  Per RN, that will be at [place] on 01/10/13.  Patient was therapeutic on heparin 1300 units/hr previously. Goal of Therapy:  Heparin level 0.3-0.7 units/ml Monitor platelets by anticoagulation protocol: Yes   Plan:  1) Restart heparin at 1300 units/hr at 0115 on 01/10/13 2) 6hr heparin level after drip is restarted. 3) Daily heparin level and CBC.  Chisom Aust, Tsz-Yin 01/09/2013,7:12 PM

## 2013-01-09 NOTE — Progress Notes (Signed)
FMTS Attending  Note: Dorcas Mcmurray MD [phone] pager office [phone] When inpatient team went to round on patient this am, we found her room empty.Quick investigation by Dr Dwana Melena and team revealed she had been taken to surgical preop holding. This was a surprise to [place] as she has not been cleared by cardiology that we are aware of and she does not have downward trending troponins, so effectivey she could still be undergoing NSTEMI. We are now on the phone with cardiology who seems to think she is actually in line for a cardiac catheterization, not ankle surgery. Dr Dwana Melena  Has spoken with Dr Sharol Given o(rtho) via nurse as he is in surgery and has also spoken with Dr Marigene Ehlers (cardiology) and we are transferring her  back to the telemetry floor  Stat.

## 2013-01-09 NOTE — Progress Notes (Signed)
Interim Progress Note  Overview: 57 y.o. female with syncopal episode found to have afib with RVR on admission and troponin which increased from <0.3-->2.16 with third troponin pending to trend. Had fallen at home and had ankle fracture which orthopedic surgery was planning to take to OR pending cardiac clearance by cardiology consult team and third troponin.  S: Was rounding on patient this morning and found she was not in her room. Asked her nurse where patient was, thinking possibly had been sent to cardiac cath. To my surprise, RN stated she had been taken down to surgery. On review, third troponin still pending (collected 9AM). Called orthopedic surgeon Dr. Sharol Given whose nurse stated pt was in periop holding and surgery had been scheduled due to pt being NPO therefore thinking pt was medically cleared for surgery. I asked them to hold on general anesthesia and called cardiologist on call Dr. Aundra Dubin who stated pt was not cleared and plan was actually for cardiac cath today after IV hydration to improve creatinine. Stated he had just spoken with anesthesiologist who was aware.  O:    Recent Labs Lab 01/08/13 1221 01/09/13 0248  TROPONINI <0.30 2.16*  Third troponin 9 AM 4/18 pending  A/P: - MSK: Called back to Dr. Jess Barters phone and asked for patient to be returned to room, and they stated she was just now being returned to room. Holding off on surgical fracture repair pending cardiac catheterization and stabilization. - CV: Likely NSTEMI overnight. To Cardiac cath today/ F/u third troponin    Conni Slipper 01/09/2013 10:31 AM

## 2013-01-09 NOTE — Progress Notes (Addendum)
Rate increased at 09:15 unable to document in [facility] upon arrival to floor notified assigned nurse and hooked patient up to telemetry box.

## 2013-01-09 NOTE — Progress Notes (Signed)
Utilization review completed. Jarmal Lewelling, RN, BSN. 

## 2013-01-09 NOTE — CV Procedure (Signed)
Cardiac Cath Procedure Note:  Indication: NSTEMI  Procedures performed:  1) Selective coronary angiography 2) Left heart catheterization 3) Left ventriculogram  Description of procedure:   The risks and indication of the procedure were explained. Consent was signed and placed on the chart. An appropriate timeout was taken prior to the procedure. After a normal Allen's test was confirmed, the right wrist was prepped and draped in the routine sterile fashion and anesthetized with 1% local lidocaine.   A 5 FR arterial sheath was then placed in the right radial artery using a modified Seldinger technique. Systemic heparin was administered. 3mg  IV verapamil was given through the sheath. Standard catheters including a JL 3.5, JR4 and straight pigtail were used. All catheter exchanges were made over a wire.  Complications:  None apparent  Total contrast: 20cc  Findings:  Ao Pressure: 176/78 (117) LV Pressure: 178/14/21 There was no signficant gradient across the aortic valve on pullback.  Left main: Normal   LAD: Large vessel. Wraps apex. 2 diagonals. 40% stenosis in midsection followed by a 30% lesion more distally.  LCX:  Tandem 80% lesions in proximal to mid AV groove. Gives off large OM-1 with short bridging section in midsection. Small OM-2.   RCA: Large dominant vessel. Mid 30% lesion   Assessment: 1. 1V CAD 2. NSTEMI  Plan/Discussion:  Cath films reviewed with Dr. Aundra Dubin. LCx lesions are tight but not critical. These would be amenable to PCI. However, given DM2 would need DES for 6 months to 1 year and this would prohibit her surgery. Thus, at this point will try aggressive medical therapy with b-blocker, ASA and nitrates, If CP free may be able to have her ankle surgery on Monday. We will follow.    Daniel Bensimhon 3:28 PM

## 2013-01-09 NOTE — Progress Notes (Signed)
  Echocardiogram 2D Echocardiogram has been performed.  [name] 01/09/2013, 12:08 PM

## 2013-01-09 NOTE — H&P (Signed)
FMTS Attending Admission Note: Sara Neal MD [phone] pager office [phone] I  have seen and examined this patient, reviewed their chart. I have discussed this patient with the resident. I agree with the resident's findings, assessment and care plan. 

## 2013-01-09 NOTE — Progress Notes (Signed)
Patient ID: [name], female   DOB: 09/18/1956, 57 y.o.   MRN: [id number]    SUBJECTIVE: Patient went out of atrial fibrillation in the ER yesterday. She had further chest pain last night and repeat TnI was 2.1.  She had chest pain again around 5 am that resolved with NTG.  Pending next set of cardiac enzymes.  She is now CP-free.  BP is running high.   Marland Kitchen aspirin  81 mg Oral Daily  . [MAR HOLD] atorvastatin  40 mg Oral QHS  . carvedilol  6.25 mg Oral BID WC  .  ceFAZolin (ANCEF) IV  3 g Intravenous On Call to OR  . [facility] HOLD] diltiazem  120 mg Oral Daily  . [MAR HOLD] insulin aspart  0-15 Units Subcutaneous TID WC  . [MAR HOLD] insulin glargine  15 Units Subcutaneous QHS  . ondansetron      . [facility] HOLD] sodium chloride  3 mL Intravenous Q12H      Filed Vitals:   01/08/13 2035 01/09/13 0520 01/09/13 0602 01/09/13 0844  BP: 189/77 198/73 198/73 178/80  Pulse: 62 66 64 68  Temp: 98.1 F (36.7 C)  99 F (37.2 C) 98.5 F (36.9 C)  TempSrc: Oral  Oral Oral  Resp: 18  18 18   Height: 5\' 7"  (1.702 m)     Weight: 246 lb 11.2 oz (111.902 kg)     SpO2: 97%  92% 96%    Intake/Output Summary (Last 24 hours) at 01/09/13 1018 Last data filed at 01/09/13 0602  Gross per 24 hour  Intake      0 ml  Output   1000 ml  Net  -1000 ml    LABS: Basic Metabolic Panel:  Recent Labs  01/08/13 1242 01/09/13 0248  NA 141 138  K 4.4 4.4  CL 110 108  CO2  --  21  GLUCOSE 212* 218*  BUN 22 24*  CREATININE 1.80* 2.01*  CALCIUM  --  8.6   Liver Function Tests:  Recent Labs  01/09/13 0248  AST 22  ALT 16  ALKPHOS 89  BILITOT 0.2*  PROT 6.7  ALBUMIN 2.8*   No results found for this basename: LIPASE, AMYLASE,  in the last 72 hours CBC:  Recent Labs  01/08/13 1216 01/08/13 1242 01/09/13 0248  WBC 10.2  --  12.6*  NEUTROABS 8.5*  --   --   HGB 9.5* 9.9* 8.8*  HCT 28.4* 29.0* 26.3*  MCV 78.9  --  79.2  PLT 344  --  355   Cardiac Enzymes:  Recent Labs   01/08/13 1221 01/09/13 0248  TROPONINI <0.30 2.16*   BNP: No components found with this basename: POCBNP,  D-Dimer: No results found for this basename: DDIMER,  in the last 72 hours Hemoglobin A1C: No results found for this basename: HGBA1C,  in the last 72 hours Fasting Lipid Panel:  Recent Labs  01/09/13 0248  CHOL 204*  HDL 40  LDLCALC 135*  TRIG 147  CHOLHDL 5.1   Thyroid Function Tests:  Recent Labs  01/08/13 1218  TSH 0.747   Anemia Panel: No results found for this basename: VITAMINB12, FOLATE, FERRITIN, TIBC, IRON, RETICCTPCT,  in the last 72 hours  RADIOLOGY: Dg Chest 1 View  01/08/2013  *RADIOLOGY REPORT*  Clinical Data: Preoperative respiratory examination for right ankle surgery/fracture.  CHEST - 1 VIEW  Comparison: 10/09/2011 and 07/25/2011.  Findings: There are low lung volumes with mild resulting bibasilar atelectasis.  Mild cardiomegaly appears  stable.  There is no edema, confluent airspace opacity, pleural effusion or pneumothorax.  No fractures are identified.  Telemetry leads overlie the chest.  IMPRESSION: Low lung volumes with bibasilar atelectasis (especially on the left).  Stable cardiomegaly.   Original Report Authenticated By: Richardean Sale, M.D.    Dg Ankle 2 Views Right  01/08/2013  *RADIOLOGY REPORT*  Clinical Data: Reduction of fracture dislocation.  RIGHT ANKLE - 2 VIEW  Comparison: Plain films 01/08/2013 and 11:59 a.m.  Findings: The patient is in a new fiberglass cast.  Position and alignment of the tibiotalar joint is improved with widening of the medial clear space persisting.  Position and alignment of a distal fibular fracture is also improved.  No new abnormality is identified.  IMPRESSION: Improved position and alignment of fracture dislocation.  No new abnormality.   Original Report Authenticated By: Orlean Patten, M.D.    Dg Ankle Complete Right  01/08/2013  *RADIOLOGY REPORT*  Clinical Data: Ankle pain and deformity status post  fall.  RIGHT ANKLE - COMPLETE 3+ VIEW  Comparison: None.  Findings: There is an oblique fracture of the distal fibular diaphysis associated with moderate posterolateral displacement. There is widening of the ankle mortise with lateral subluxation of the talus by approximately 1.5 cm.  There is likely a fracture of the posterior lateral aspect of the tibial plafond.  No talar dome fracture is identified.  A plantar calcaneal spur is noted.  IMPRESSION: Right ankle fracture dislocation as described.   Original Report Authenticated By: Richardean Sale, M.D.    Ct Head Wo Contrast  01/08/2013  *RADIOLOGY REPORT*  Clinical Data: Status post fall.  Possible syncopal episode.  CT HEAD WITHOUT CONTRAST  Technique:  Contiguous axial images were obtained from the base of the skull through the vertex without contrast.  Comparison: Head CT 10/09/2011.  Findings: There is no evidence of acute intracranial hemorrhage, mass lesion, brain edema or extra-axial fluid collection.  Mild periventricular white matter disease appears stable with the exception of new decreased density in the right lentiform nucleus on image 13.  No cortical based infarct or hydrocephalus is identified.  There is a probable mucous retention cyst within the left maxillary sinus.  The visualized paranasal sinuses, mastoid air cells and middle ears are otherwise clear.  There is no evidence of calvarial fracture.  IMPRESSION:  1.  New ill-defined low density in the right lentiform nucleus, likely an age indeterminate small vessel stroke. 2.  Otherwise stable examination.  No acute post-traumatic findings identified.   Original Report Authenticated By: Richardean Sale, M.D.     PHYSICAL EXAM General: NAD Neck: [name], no thyromegaly or thyroid nodule.  Lungs: Clear to auscultation bilaterally with normal respiratory effort. CV: Nondisplaced PMI.  Heart regular S1/S2, no S3/S4, 1/6 SEM RUSB.  Trace ankle edema.  No carotid bruit.  Normal pedal pulses.   Abdomen: Soft, nontender, no hepatosplenomegaly, no distention.  Neurologic: Alert and oriented x 3.  Psych: Normal affect. Extremities: No clubbing or cyanosis.   TELEMETRY: Reviewed telemetry pt in NSR  ASSESSMENT AND PLAN: 57 yo with history of DM, HTN, CKD (likely diabetic nephropathy) presented to ER yesterday after a syncopal episode.  She had chest pressure yesterday am then nausea.  In the setting of the nausea, she passed out and fractured her ankle.  In the ER, she was in afib with RVR but converted on diltiazem gtt to NSR.  Overnight, she had more chest pressure and was found to have troponin of  2.1.  ECG is nonacute. 1. CAD: NSTEMI.  Difficult situation as she has baseline CKD and also will need surgery on her ankle after the fracture.  However, she has been having on and off chest pain responsive to nitrates and has elevated cardiac enzymes.  I think that she will need a catheterization with possible PCI.  Will have to make decision on surgical timing based on cath results.  - LHC today => will hydrate through the rest of the morning and early afternoon with NS @ 100 cc/hr given CKD.  No LV-gram (will do echo).  Will need to minimize dye.  - NTG gtt for BP and ongoing CP episodes.  - Continue heparin gtt, will make sure she got ASA today, increase atorvastatin to 80 mg daily.  2. Atrial fibrillation: Now back in NSR.  CHADSVASC = 4.  Should likely be anticoagulated long-term but will need to formulate this plan after cath and after decision about surgical timing. Continue Coreg and diltiazem CD for now.  3. CKD: Creatinine 2 today.  She has presumed diabetic nephropathy with creatinine appearing to run 1.6-2 in the past.  Will hydrate this morning prior to cath, will minimize dye.  I did talk to her about the risk of worsening renal failure but given NSTEMI with ongoing chest pain episodes, I think risk/benefit ratio points to cath with judicious hydration and minimization of dye.  4.  Ankle fracture: Will need surgery, timiing at this point is unclear.  5. Syncope: ? Vasovagal in the setting of nausea.   Loralie Champagne 01/09/2013 10:29 AM

## 2013-01-09 NOTE — Progress Notes (Signed)
Patient ID: [name], female   DOB: 1956/09/13, 57 y.o.   MRN: [id number] Patient with diabetic insensate neuropathy with a displaced right ankle Weber B. fibular fracture with displacement of the mortise. Patient is currently n.p.o. Discussed that we will try to add on for surgery for early afternoon pending clearance medically. Discussed that we would proceed with open reduction and total fixation of the fibula. Risks and benefits of surgery were discussed including infection neurovascular injury need for additional surgery. Patient states she understands was to proceed with surgery at this time.

## 2013-01-09 NOTE — Progress Notes (Signed)
ANTICOAGULATION CONSULT NOTE - Follow Up Consult  Pharmacy Consult for heparin Indication: atrial fibrillation  Allergies  Allergen Reactions  . Lisinopril Other (See Comments)    coughing    Patient Measurements: Height: 5\' 7"  (170.2 cm) Weight: 246 lb 11.2 oz (111.902 kg) IBW/kg (Calculated) : 61.6   Vital Signs: Temp: 98.5 F (36.9 C) (04/18 0844) Temp src: Oral (04/18 0844) BP: 178/80 mmHg (04/18 0844) Pulse Rate: 68 (04/18 0844)  Labs:  Recent Labs  01/08/13 1216 01/08/13 1221 01/08/13 1242 01/08/13 1919 01/09/13 0248  HGB 9.5*  --  9.9*  --  8.8*  HCT 28.4*  --  29.0*  --  26.3*  PLT 344  --   --   --  355  APTT 29  --   --   --   --   LABPROT 13.7  --   --   --   --   INR 1.06  --   --   --   --   HEPARINUNFRC  --   --   --  0.53 0.43  CREATININE  --   --  1.80*  --  2.01*  TROPONINI  --  <0.30  --   --  2.16*    Estimated Creatinine Clearance: 39.8 ml/min (by C-G formula based on Cr of 2.01).  Assessment: Patient is a 57 y.o. on heparin for new onset afib.  Heparin level is at goal with 0.43. Plan for possible ORIF for ankle fracture/disposition.  Please advise when you want to discontinue heparin drip if to take patient to surgery.  Goal of Therapy:  Heparin level 0.3-0.7 units/ml Monitor platelets by anticoagulation protocol: Yes   Plan:  1) continue current rate of heparin  Maverik Foot P 01/09/2013,8:50 AM

## 2013-01-09 NOTE — Progress Notes (Signed)
FMTS Attending Daily Note: Greydon Betke MD [phone] pager office [phone] I have discussed this patient with the resident and reviewed the assessment and plan as documented above. I agree wit the resident's findings and plan.  

## 2013-01-09 NOTE — Progress Notes (Signed)
FMTS Daily Intern Progress Note  Subjective: [name] had some dull chest pain this morning but none currently. Not very anxious about procedure, stating "what else can I do."  I have reviewed the patient's medications. No PRN in 24 hours.  Objective Temp:  [97.8 F (36.6 C)-99 F (37.2 C)] 98.5 F (36.9 C) (04/18 0844) Pulse Rate:  [62-123] 68 (04/18 0844) Resp:  [13-23] 18 (04/18 0844) BP: (137-198)/(61-90) 178/80 mmHg (04/18 0844) SpO2:  [92 %-100 %] 96 % (04/18 0844) Weight:  [240 lb (108.863 kg)-246 lb 11.2 oz (111.902 kg)] 246 lb 11.2 oz (111.902 kg) (04/17 2035)   Intake/Output Summary (Last 24 hours) at 01/09/13 0955 Last data filed at 01/09/13 0602  Gross per 24 hour  Intake      0 ml  Output   1000 ml  Net  -1000 ml    CBG (last 3)   Recent Labs  01/08/13 1812 01/08/13 2038 01/09/13 0934  GLUCAP 251* 237* 157*    General: NAD, being placed in bed to wheel down to OR HEENT: EOMI, scler aclear CV: RRR with no murmur, rubs, or gallops Pulm: CTAB with normal effort Abd: Soft, nontender, nondistended Ext: Right leg wrapped in ACE bandage, left leg with no LE edema or tenderness Neuro: Awake, alert, no focal deficits, normal speech  Labs and Imaging  Recent Labs Lab 01/08/13 1216 01/08/13 1242 01/09/13 0248  WBC 10.2  --  12.6*  HGB 9.5* 9.9* 8.8*  HCT 28.4* 29.0* 26.3*  PLT 344  --  355     Recent Labs Lab 01/08/13 1242 01/09/13 0248  NA 141 138  K 4.4 4.4  CL 110 108  CO2  --  21  BUN 22 24*  CREATININE 1.80* 2.01*  GLUCOSE 212* 218*  CALCIUM  --  8.6     Recent Labs Lab 01/08/13 1221 01/09/13 0248 01/09/13 0929  TROPONINI <0.30 2.16* 1.66*   Repeat EKG this morning with no signs of active ischemia    Assessment and Plan - [name] is a 57 y.o. year old female with a PMH of hypertension, diabetes, and severe diabetic neuropathy presenting with atrial fibrillation with RVR and a fractured right ankle after a syncopal  epidsode 4/17.   # Ankle Fracture - Oblique fracture of distal fibula, s/p reduction and splinting; Occurred 01/08/13  - Plan for surgery once medically cleared (currently undergoing cardiac catheterization today) - Maintain elevated with ice, non weight bearing  - Tylenol and oxycodone for pain PRN   # Atrial Fibrillation with RVR - Noted A fib on first EKG in ED, given CHADS Score of 4, patient placed on heparin drip and diltiazem drip; Converted to sinus rhythm  - Convert from diltiazem drip to PO diltiazem 30 mg q 6 followed by 120 mg daily per recommendations from cardiology team  - Maintain Heparin drip in preparation of surgery and address anticoagulation needs thereafter  - Telemetry  - ACS rule out with troponin and repeat EKG in AM (trending downward at 1.66 and EKG with no active ischemia) - Obtain Echo - Cardiac catheterization today - Will f/u further cardiology recs, greatly appreciate their consult  # Syncope - Unknown etiology, but likely atrial fibrillation with RVR vs other dysrhythmia vs orthostatic hypotension vs PE (Wells Score 1.5); CT head ruled out acute bleed  - Treating dysrhythmia as noted above  - Cannot check orthostatic vitals given non weight bearing status, but gave IVF to improve vol status  -  No need for PE eval since low suspsicion and already  - F/u echo and carotid doppler  # Vascular Disease - Noted to have possible small vessel stroke on right lentiform nucleus which is likely old and does not correlated with current condition  - Continue home dose Lipitor and recheck lipids - Carotid doppler and echo to r/o possible causes of CVA   # Hypertension - Patient taking carvedilol daily, previously also on amlodipine and chlorthalidone, but ran out months ago  - Start back low dose carvedilol as needed, consider restarting other meds if necessary  - If remains very elevated, consider PRN hydralazine  # DM, type 2 with Neurologic Deficits -  - Check A1C   - Start Lantus at 20U (home dose 30) and moderate sliding scale   # CKD Stage III - Creat 1.8--> 2.01 today - IV fluids prior to cardiac cath - Avoid nephrotoxic agents   # Anemia, Normocytic - Chronic, Hgb baseline 10; 9.9 on presentation-->8.8 today likely hemodiluted - F/u daily CBC   FENGI - Carb modified diet, NS IVF at 75  PPX - On heparin drip  DISPO - Admit to telemetry bed under care of Family Medicine Teaching Service; pending cardiac workup and possible ankle fracture repair  Conni Slipper [phone] 01/09/2013, 9:55 AM

## 2013-01-10 DIAGNOSIS — I214 Non-ST elevation (NSTEMI) myocardial infarction: Secondary | ICD-10-CM

## 2013-01-10 DIAGNOSIS — N183 Chronic kidney disease, stage 3 unspecified: Secondary | ICD-10-CM

## 2013-01-10 DIAGNOSIS — R55 Syncope and collapse: Secondary | ICD-10-CM

## 2013-01-10 DIAGNOSIS — Z8673 Personal history of transient ischemic attack (TIA), and cerebral infarction without residual deficits: Secondary | ICD-10-CM

## 2013-01-10 LAB — CBC
Hemoglobin: 8.4 g/dL — ABNORMAL LOW (ref 12.0–15.0)
MCH: 26.3 pg (ref 26.0–34.0)
MCHC: 33.2 g/dL (ref 30.0–36.0)
Platelets: 312 10*3/uL (ref 150–400)

## 2013-01-10 LAB — BASIC METABOLIC PANEL
CO2: 19 mEq/L (ref 19–32)
Calcium: 8.6 mg/dL (ref 8.4–10.5)
Creatinine, Ser: 2.06 mg/dL — ABNORMAL HIGH (ref 0.50–1.10)
GFR calc Af Amer: 30 mL/min — ABNORMAL LOW (ref >90)
GFR calc non Af Amer: 26 mL/min — ABNORMAL LOW (ref >90)
Glucose, Bld: 136 mg/dL — ABNORMAL HIGH (ref 70–99)
Potassium: 4.3 mEq/L (ref 3.5–5.1)
Sodium: 139 mEq/L (ref 135–145)

## 2013-01-10 LAB — HEPARIN LEVEL (UNFRACTIONATED): Heparin Unfractionated: 0.18 IU/mL — ABNORMAL LOW (ref 0.30–0.70)

## 2013-01-10 LAB — GLUCOSE, CAPILLARY
Glucose-Capillary: 137 mg/dL — ABNORMAL HIGH (ref 70–99)
Glucose-Capillary: 176 mg/dL — ABNORMAL HIGH (ref 70–99)

## 2013-01-10 MED ORDER — HEPARIN (PORCINE) IN NACL 100-0.45 UNIT/ML-% IJ SOLN
1650.0000 [IU]/h | INTRAMUSCULAR | Status: DC
  Administered 2013-01-10 – 2013-01-11 (×2): 1650 [IU]/h via INTRAVENOUS
  Filled 2013-01-10 (×2): qty 250

## 2013-01-10 NOTE — Progress Notes (Signed)
ANTICOAGULATION CONSULT NOTE - Follow Up Consult  Pharmacy Consult for Heparin Indication: atrial fibrillation  Allergies  Allergen Reactions  . Lisinopril Other (See Comments)    coughing    Patient Measurements: Height: 5\' 7"  (170.2 cm) Weight: 246 lb 11.2 oz (111.902 kg) IBW/kg (Calculated) : 61.6 Heparin Dosing Weight: 87kg  Vital Signs: Temp: 98.1 F (36.7 C) (04/19 0500) Temp src: Oral (04/19 0500) BP: 166/72 mmHg (04/19 0945) Pulse Rate: 76 (04/19 0816)  Labs:  Recent Labs  01/08/13 1216  01/08/13 1242 01/08/13 1919 01/09/13 0248 01/09/13 0929 01/09/13 1742 01/09/13 2200 01/10/13 0725  HGB 9.5*  --  9.9*  --  8.8*  --   --   --  8.4*  HCT 28.4*  --  29.0*  --  26.3*  --   --   --  25.3*  PLT 344  --   --   --  355  --   --   --  312  APTT 29  --   --   --   --   --   --   --   --   LABPROT 13.7  --   --   --   --   --   --   --   --   INR 1.06  --   --   --   --   --   --   --   --   HEPARINUNFRC  --   --   --  0.53 0.43  --   --   --  0.18*  CREATININE  --   --  1.80*  --  2.01*  --   --   --  2.06*  TROPONINI  --   < >  --   --  2.16* 1.66* 0.99* 0.86*  --   < > = values in this interval not displayed.  Estimated Creatinine Clearance: 38.9 ml/min (by C-G formula based on Cr of 2.06).   Medications:  Heparin @ 1300 units/hr  Assessment: 57yof started on heparin for new onset afib. Also had CP with elevated troponin so was taken to the cath lab and found to have 1v CAD amenable to PCI. Heparin was resumed after cath with plans for PCI next week. Initial heparin lab is below goal. Hgb/Hct low but stable, platelets ok. No bleeding reported.  Goal of Therapy:  Heparin level 0.3-0.7 units/ml Monitor platelets by anticoagulation protocol: Yes   Plan:  1) Increase heparin to 1650 units/hr 2) Heparin level in 6 hours  Deboraha Sprang 01/10/2013,10:29 AM

## 2013-01-10 NOTE — Progress Notes (Addendum)
Patient ID: [name], female   DOB: 1956-02-20, 57 y.o.   MRN: [id number]    SUBJECTIVE: Patient went out of atrial fibrillation in the ER yesterday. She had further chest pain last night and repeat TnI was 2.1.  She had chest pain again around 5 am that resolved with NTG.  Pending next set of cardiac enzymes.  She is now CP-free.  BP is running high.   Marland Kitchen aspirin  81 mg Oral Daily  . atorvastatin  80 mg Oral QHS  . atorvastatin  80 mg Oral q1800  . carvedilol  6.25 mg Oral BID WC  . diltiazem  120 mg Oral Daily  . insulin aspart  0-15 Units Subcutaneous TID WC  . insulin glargine  15 Units Subcutaneous QHS  . isosorbide mononitrate  30 mg Oral Daily  . sodium chloride  3 mL Intravenous Q12H      Filed Vitals:   01/09/13 2159 01/10/13 0500 01/10/13 0816 01/10/13 0945  BP: 171/80 201/84  166/72  Pulse: 80 82 76   Temp:  98.1 F (36.7 C)    TempSrc:  Oral    Resp:  20    Height:      Weight:      SpO2:  95%      Intake/Output Summary (Last 24 hours) at 01/10/13 0957 Last data filed at 01/10/13 [id number]  Gross per 24 hour  Intake    243 ml  Output   1700 ml  Net  -1457 ml    LABS: Basic Metabolic Panel:  Recent Labs  01/09/13 0248 01/10/13 0725  NA 138 139  K 4.4 4.3  CL 108 110  CO2 21 19  GLUCOSE 218* 136*  BUN 24* 21  CREATININE 2.01* 2.06*  CALCIUM 8.6 8.6   Liver Function Tests:  Recent Labs  01/09/13 0248  AST 22  ALT 16  ALKPHOS 89  BILITOT 0.2*  PROT 6.7  ALBUMIN 2.8*   No results found for this basename: LIPASE, AMYLASE,  in the last 72 hours CBC:  Recent Labs  01/08/13 1216  01/09/13 0248 01/10/13 0725  WBC 10.2  --  12.6* 10.8*  NEUTROABS 8.5*  --   --   --   HGB 9.5*  < > 8.8* 8.4*  HCT 28.4*  < > 26.3* 25.3*  MCV 78.9  --  79.2 79.3  PLT 344  --  355 312  < > = values in this interval not displayed. Cardiac Enzymes:  Recent Labs  01/09/13 0929 01/09/13 1742 01/09/13 2200  TROPONINI 1.66* 0.99* 0.86*   BNP: No  components found with this basename: POCBNP,  D-Dimer: No results found for this basename: DDIMER,  in the last 72 hours Hemoglobin A1C:  Recent Labs  01/08/13 2306  HGBA1C 7.0*   Fasting Lipid Panel:  Recent Labs  01/09/13 0248  CHOL 204*  HDL 40  LDLCALC 135*  TRIG 147  CHOLHDL 5.1   Thyroid Function Tests:  Recent Labs  01/08/13 1218  TSH 0.747   Anemia Panel: No results found for this basename: VITAMINB12, FOLATE, FERRITIN, TIBC, IRON, RETICCTPCT,  in the last 72 hours  RADIOLOGY: Dg Chest 1 View  01/08/2013  *RADIOLOGY REPORT*  Clinical Data: Preoperative respiratory examination for right ankle surgery/fracture.  CHEST - 1 VIEW  Comparison: 10/09/2011 and 07/25/2011.  Findings: There are low lung volumes with mild resulting bibasilar atelectasis.  Mild cardiomegaly appears stable.  There is no edema, confluent airspace opacity, pleural effusion or  pneumothorax.  No fractures are identified.  Telemetry leads overlie the chest.  IMPRESSION: Low lung volumes with bibasilar atelectasis (especially on the left).  Stable cardiomegaly.   Original Report Authenticated By: Richardean Sale, M.D.    Dg Ankle 2 Views Right  01/08/2013  *RADIOLOGY REPORT*  Clinical Data: Reduction of fracture dislocation.  RIGHT ANKLE - 2 VIEW  Comparison: Plain films 01/08/2013 and 11:59 a.m.  Findings: The patient is in a new fiberglass cast.  Position and alignment of the tibiotalar joint is improved with widening of the medial clear space persisting.  Position and alignment of a distal fibular fracture is also improved.  No new abnormality is identified.  IMPRESSION: Improved position and alignment of fracture dislocation.  No new abnormality.   Original Report Authenticated By: Orlean Patten, M.D.    Dg Ankle Complete Right  01/08/2013  *RADIOLOGY REPORT*  Clinical Data: Ankle pain and deformity status post fall.  RIGHT ANKLE - COMPLETE 3+ VIEW  Comparison: None.  Findings: There is an oblique  fracture of the distal fibular diaphysis associated with moderate posterolateral displacement. There is widening of the ankle mortise with lateral subluxation of the talus by approximately 1.5 cm.  There is likely a fracture of the posterior lateral aspect of the tibial plafond.  No talar dome fracture is identified.  A plantar calcaneal spur is noted.  IMPRESSION: Right ankle fracture dislocation as described.   Original Report Authenticated By: Richardean Sale, M.D.    Ct Head Wo Contrast  01/08/2013  *RADIOLOGY REPORT*  Clinical Data: Status post fall.  Possible syncopal episode.  CT HEAD WITHOUT CONTRAST  Technique:  Contiguous axial images were obtained from the base of the skull through the vertex without contrast.  Comparison: Head CT 10/09/2011.  Findings: There is no evidence of acute intracranial hemorrhage, mass lesion, brain edema or extra-axial fluid collection.  Mild periventricular white matter disease appears stable with the exception of new decreased density in the right lentiform nucleus on image 13.  No cortical based infarct or hydrocephalus is identified.  There is a probable mucous retention cyst within the left maxillary sinus.  The visualized paranasal sinuses, mastoid air cells and middle ears are otherwise clear.  There is no evidence of calvarial fracture.  IMPRESSION:  1.  New ill-defined low density in the right lentiform nucleus, likely an age indeterminate small vessel stroke. 2.  Otherwise stable examination.  No acute post-traumatic findings identified.   Original Report Authenticated By: Richardean Sale, M.D.     PHYSICAL EXAM General: NAD Neck: [name], no thyromegaly or thyroid nodule.  Lungs: Clear to auscultation bilaterally with normal respiratory effort. CV: Nondisplaced PMI.  Heart regular S1/S2, no S3/S4, 1/6 SEM RUSB.  Trace ankle edema.  No carotid bruit.  Normal pedal pulses.  Abdomen: Soft, nontender, no hepatosplenomegaly, no distention.  Neurologic: Alert and  oriented x 3.  Psych: Normal affect. Extremities: No clubbing or cyanosis. Right leg in a splint   TELEMETRY: Reviewed telemetry pt in NSR  ASSESSMENT AND PLAN: 57 yo with history of DM, HTN, CKD (likely diabetic nephropathy) presented to ER yesterday after a syncopal episode.  She had chest pressure yesterday am then nausea.  In the setting of the nausea, she passed out and fractured her ankle.  In the ER, she was in afib with RVR but converted on diltiazem gtt to NSR.  Overnight, she had more chest pressure and was found to have troponin of 2.1.  ECG is nonacute.  1.  CAD: NSTEMI.  Difficult situation as she has baseline CKD and also will need surgery on her ankle after the fracture.  However, she has been having on and off chest pain responsive to nitrates and has elevated cardiac enzymes.    - NTG gtt for BP and ongoing CP episodes.  - Continue heparin gtt, will make sure she got ASA today, increase atorvastatin to 80 mg daily.   I would think that she could have PCI later next week.  I will review the cath films.    2. Atrial fibrillation: Now back in NSR.  CHADSVASC = 4.  Should likely be anticoagulated long-term but will need to formulate this plan after cath and after decision about surgical timing. Continue Coreg and diltiazem CD for now.   3. CKD: Creatinine 2 today.  She has presumed diabetic nephropathy with creatinine appearing to run 1.6-2 in the past.  Will hydrate this morning prior to cath, will minimize dye.  I did talk to her about the risk of worsening renal failure but given NSTEMI with ongoing chest pain episodes, I think risk/benefit ratio points to cath with judicious hydration and minimization of dye.   4. Ankle fracture: Will need surgery, timiing at this point is unclear.   5. Syncope: ? Vasovagal in the setting of nausea.   Darden Amber. 01/10/2013 9:57 AM

## 2013-01-10 NOTE — Progress Notes (Signed)
ANTICOAGULATION CONSULT NOTE - Follow Up Consult  Pharmacy Consult for Heparin Indication: atrial fibrillation  Allergies  Allergen Reactions  . Lisinopril Other (See Comments)    coughing    Patient Measurements: Height: 5\' 7"  (170.2 cm) Weight: 246 lb 11.2 oz (111.902 kg) IBW/kg (Calculated) : 61.6 Heparin Dosing Weight: 87kg  Vital Signs: Temp: 98.3 F (36.8 C) (04/19 1341) Temp src: Oral (04/19 1341) BP: 179/74 mmHg (04/19 1735) Pulse Rate: 66 (04/19 1735)  Labs:  Recent Labs  01/08/13 1216  01/08/13 1242  01/09/13 0248 01/09/13 0929 01/09/13 1742 01/09/13 2200 01/10/13 0725 01/10/13 1705  HGB 9.5*  --  9.9*  --  8.8*  --   --   --  8.4*  --   HCT 28.4*  --  29.0*  --  26.3*  --   --   --  25.3*  --   PLT 344  --   --   --  355  --   --   --  312  --   APTT 29  --   --   --   --   --   --   --   --   --   LABPROT 13.7  --   --   --   --   --   --   --   --   --   INR 1.06  --   --   --   --   --   --   --   --   --   HEPARINUNFRC  --   --   --   < > 0.43  --   --   --  0.18* 0.35  CREATININE  --   --  1.80*  --  2.01*  --   --   --  2.06*  --   TROPONINI  --   < >  --   --  2.16* 1.66* 0.99* 0.86*  --   --   < > = values in this interval not displayed.  Estimated Creatinine Clearance: 38.9 ml/min (by C-G formula based on Cr of 2.06).   Medications:  Heparin @ 1300 units/hr  Assessment: [name] started on heparin for new onset afib. Also had CP with elevated troponin so was taken to the cath lab and found to have 1v CAD amenable to PCI. Heparin was resumed after cath with plans for PCI next week. Initial heparin level 0.18 is below goal. Hgb/Hct low but stable, platelets ok. No bleeding reported. Heparin drip rate increased to 1650 uts/hr with HL 0.35 at goal.    Goal of Therapy:  Heparin level 0.3-0.7 units/ml Monitor platelets by anticoagulation protocol: Yes   Plan:  1) Continue heparin to 1650 units/hr 2) Heparin level and CBC daily   Bonnita Nasuti  Pharm.D. CPP, BCPS Clinical Pharmacist [phone] 01/10/2013 5:52 PM

## 2013-01-10 NOTE — Progress Notes (Signed)
FMTS Daily Intern Progress Note  Subjective: NAD, no chest pain or ankle pain, no dizziness or SOB, ate breakfast and not feeling overly anxious.  I have reviewed the patient's medications. PRN zofran 955AM yesterday  Objective Temp:  [98.1 F (36.7 C)-99 F (37.2 C)] 98.1 F (36.7 C) (04/19 0500) Pulse Rate:  [68-85] 82 (04/19 0500) Resp:  [18-20] 20 (04/19 0500) BP: (171-202)/(74-133) 201/84 mmHg (04/19 0500) SpO2:  [95 %-98 %] 95 % (04/19 0500)   Intake/Output Summary (Last 24 hours) at 01/10/13 0651 Last data filed at 01/10/13 0600  Gross per 24 hour  Intake      0 ml  Output   1700 ml  Net  -1700 ml    CBG (last 3)   Recent Labs  01/09/13 1547 01/09/13 1801 01/09/13 2058  GLUCAP 144* 145* 179*    General: NAD, sitting at bedside HEENT: EOMI, scler aclear CV: RRR with II/VI syst murmur heard best RUSB, no carotid bruits, [name] appreciated Pulm: CTAB with normal effort, fine crackles bilateral bases Abd: Soft, nontender, nondistended, NABS Ext: Right leg wrapped in ACE bandage, left leg with no LE edema or tenderness, no calf tenderness bilaterally Neuro: Awake, alert, no focal deficits, normal speech  Labs and Imaging  Recent Labs Lab 01/08/13 1216 01/08/13 1242 01/09/13 0248  WBC 10.2  --  12.6*  HGB 9.5* 9.9* 8.8*  HCT 28.4* 29.0* 26.3*  PLT 344  --  355     Recent Labs Lab 01/08/13 1242 01/09/13 0248  NA 141 138  K 4.4 4.4  CL 110 108  CO2  --  21  BUN 22 24*  CREATININE 1.80* 2.01*  GLUCOSE 212* 218*  CALCIUM  --  8.6      Recent Labs Lab 01/08/13 1221 01/09/13 0248 01/09/13 0929 01/09/13 1742 01/09/13 2200  TROPONINI <0.30 2.16* 1.66* 0.99* 0.86*   A1c 7  Repeat EKG this morning with no signs of active ischemia  2D echocardiogram 01/09/13: Study Conclusions - Left ventricle: The cavity size was normal. Wall thickness was increased in a pattern of mild LVH. Systolic function was normal. The estimated ejection fraction was  in the range of 60% to 65%. Wall motion was normal; there were no regional wall motion abnormalities. Doppler parameters are consistent with abnormal left ventricular relaxation (grade 1 diastolic dysfunction). - Left atrium: The atrium was mildly dilated. Transthoracic echocardiography. M-mode, complete 2D, spectral Doppler, and color Doppler. Height: Height: 170.2cm. Height: 67in. Weight: Weight: 111.6kg. Weight: 245.5lb. Body mass index: BMI: 38.5kg/m^2. Body surface area: BSA: 2.73m^2. Blood pressure: 178/80. Patient status: Inpatient. Location: Bedside.  4/18 cardiac catheterization:  1. 1V CAD  2. NSTEMI     Assessment and Plan - [name] is a 57 y.o. year old female with a PMH of hypertension, diabetes, and severe diabetic neuropathy presenting with atrial fibrillation with RVR and a fractured right ankle after a syncopal epidsode 4/17.   # Ankle Fracture - Oblique fracture of distal fibula, s/p reduction and splinting; Occurred 01/08/13  - Plan for surgery once medically cleared (currently undergoing cardiac catheterization today) - Monday if chest pain free until then - Maintain elevated with ice, non weight bearing  - Tylenol and oxycodone for pain PRN   # Atrial Fibrillation with RVR - Noted A fib on first EKG in ED, given CHADS Score of 4, patient placed on heparin drip and diltiazem drip; Converted to sinus rhythm  - Convert from diltiazem drip to PO diltiazem 30  mg q 6 followed by 120 mg daily per recommendations from cardiology team  - Maintain Heparin drip in preparation of surgery and address anticoagulation needs thereafter  - Telemetry  - ACS rule out with troponin and repeat EKG in AM (trending downward at 1.66 and EKG with no active ischemia) - Obtain Echo - Cardiac catheterization 4/18 showed tight left circumflex lesions (tandem 80% lesions in proximal to mid AV groove) that are not critical. Amenable to PCI but would require drug-eluting stent for  69m-84yr  with DM Type II, which would prohibit ankle surgery. Therefore, per cardiology team, try aggressive med therapy with B-blocker, ASA, and nitrates. Surg Monday acceptable if chest pain free. - Will f/u further cardiology recs, greatly appreciate their consult  # Syncope - Unknown etiology, but likely atrial fibrillation with RVR vs other dysrhythmia vs orthostatic hypotension vs PE (Wells Score 1.5); CT head ruled out acute bleed. Echo with LVH, grade I diastolic dysfunction, mild atrial dilation with EF 60-65%  - Treating dysrhythmia as noted above  - Cannot check orthostatic vitals given non weight bearing status, but gave IVF to improve vol status  - No need for PE eval since low suspsicion and already  - F/u carotid doppler  # Vascular Disease - Noted to have possible small vessel stroke on right lentiform nucleus which is likely old and does not correlated with current condition  - Continue home dose Lipitor and recheck lipids - Carotid doppler and echo to r/o possible causes of CVA   # Hypertension - Very elevated over 24 hours. Patient taking carvedilol daily, previously also on amlodipine and chlorthalidone, but ran out months ago  - Start back low dose carvedilol as needed, consider restarting other meds if necessary  - If remains very elevated, consider PRN hydralazine - discuss today with cardiology  # DM, type 2 with Neurologic Deficits -  - Check A1C  - Start Lantus at 20U (home dose 30) and moderate sliding scale   # CKD Stage III - Creat 1.8--> 2.01 4/18 - IV fluids prior to cardiac cath - Avoid nephrotoxic agents  - recheck  # Anemia, Normocytic - Chronic, Hgb baseline 10; 9.9 on presentation-->8.8 today likely hemodiluted - F/u daily CBC   FENGI - Carb modified diet, NS IVF at 75  PPX - On heparin drip  DISPO - Admit to telemetry bed under care of Family Medicine Teaching Service; pending cardiac stabilization and possible ankle fracture repair  Conni Slipper [phone] 01/10/2013, 6:51 AM

## 2013-01-10 NOTE — Progress Notes (Signed)
FMTS Attending Daily Note: Connie Mcmurray MD [phone] pager office [phone] I  have seen and examined this patient, reviewed their chart. I have discussed this patient with the resident. I agree with the resident's findings, assessment and care plan. I discussed events of yesterday with her. She was aware they had taken her down to pre-surgical waiting and then abruptly returned her to her room and was told she was not cleared for surgery. I explained the confusion. I also discussed her upcoming procedures---ortho surgery on Monday and likely stent placement later in week. She was relieved that the stent placement was a procedure similar to her cath because a family member had told her she was going to have her "chest sliced open". I also started discussion about re-conditioning of physical fitness once she has her cardiac issues cleared up. This will be a little more difficult as she is also recovering from ankle fx. I wonder if she would qualify for cardiac rehab?

## 2013-01-10 NOTE — Progress Notes (Signed)
Patient ID: [name], female   DOB: 1955/12/20, 57 y.o.   MRN: [id number] Cardiac catheterization results reviewed. Will hold on surgery for her ankle and reevaluate on Monday.

## 2013-01-10 NOTE — Progress Notes (Signed)
*  PRELIMINARY RESULTS* Vascular Ultrasound Carotid Duplex (Doppler) has been completed.   There is no obvious evidence of hemodynamically significant internal carotid artery stenosis >40%. The right vertebral artery is patent with antegrade flow. Unable to visualize the left vertebral artery.  Incidental finding: There is a vascular structure visualized at the superior left thyroid lobe with low resistant flow.  01/10/2013 12:45 PM Maudry Mayhew, RDMS, RDCS

## 2013-01-11 DIAGNOSIS — D62 Acute posthemorrhagic anemia: Secondary | ICD-10-CM

## 2013-01-11 DIAGNOSIS — S82891A Other fracture of right lower leg, initial encounter for closed fracture: Secondary | ICD-10-CM

## 2013-01-11 DIAGNOSIS — I2510 Atherosclerotic heart disease of native coronary artery without angina pectoris: Secondary | ICD-10-CM

## 2013-01-11 DIAGNOSIS — I700 Atherosclerosis of aorta: Secondary | ICD-10-CM

## 2013-01-11 LAB — PREPARE RBC (CROSSMATCH)

## 2013-01-11 LAB — GLUCOSE, CAPILLARY
Glucose-Capillary: 120 mg/dL — ABNORMAL HIGH (ref 70–99)
Glucose-Capillary: 142 mg/dL — ABNORMAL HIGH (ref 70–99)

## 2013-01-11 LAB — BASIC METABOLIC PANEL
Calcium: 8.5 mg/dL (ref 8.4–10.5)
GFR calc Af Amer: 24 mL/min — ABNORMAL LOW (ref >90)
GFR calc non Af Amer: 21 mL/min — ABNORMAL LOW (ref >90)
Glucose, Bld: 126 mg/dL — ABNORMAL HIGH (ref 70–99)
Sodium: 139 mEq/L (ref 135–145)

## 2013-01-11 LAB — CBC
HCT: 23.8 % — ABNORMAL LOW (ref 36.0–46.0)
MCHC: 32.8 g/dL (ref 30.0–36.0)
Platelets: 300 10*3/uL (ref 150–400)
RDW: 13.5 % (ref 11.5–15.5)

## 2013-01-11 MED ORDER — HEPARIN (PORCINE) IN NACL 100-0.45 UNIT/ML-% IJ SOLN
1450.0000 [IU]/h | INTRAMUSCULAR | Status: DC
  Administered 2013-01-11 – 2013-01-12 (×3): 1550 [IU]/h via INTRAVENOUS
  Filled 2013-01-11 (×6): qty 250

## 2013-01-11 MED ORDER — HYDRALAZINE HCL 10 MG PO TABS
10.0000 mg | ORAL_TABLET | Freq: Four times a day (QID) | ORAL | Status: DC
  Administered 2013-01-11 – 2013-01-14 (×10): 10 mg via ORAL
  Filled 2013-01-11 (×15): qty 1

## 2013-01-11 MED ORDER — CHLORTHALIDONE 25 MG PO TABS: 25.0000 mg | ORAL_TABLET | Freq: Every day | ORAL | Status: DC

## 2013-01-11 MED ORDER — CHLORTHALIDONE 25 MG PO TABS
25.0000 mg | ORAL_TABLET | Freq: Every day | ORAL | Status: DC
  Administered 2013-01-11 – 2013-01-15 (×5): 25 mg via ORAL
  Filled 2013-01-11 (×5): qty 1

## 2013-01-11 MED ORDER — LACTATED RINGERS IV SOLN: INTRAVENOUS | Status: DC

## 2013-01-11 MED ORDER — CARVEDILOL 12.5 MG PO TABS
12.5000 mg | ORAL_TABLET | Freq: Two times a day (BID) | ORAL | Status: DC
  Administered 2013-01-11 – 2013-01-15 (×8): 12.5 mg via ORAL
  Filled 2013-01-11 (×11): qty 1

## 2013-01-11 NOTE — Progress Notes (Signed)
ANTICOAGULATION CONSULT NOTE - Follow Up Consult  Pharmacy Consult for Heparin Indication: atrial fibrillation  Allergies  Allergen Reactions  . Lisinopril Other (See Comments)    coughing    Patient Measurements: Height: 5\' 7"  (170.2 cm) Weight: 246 lb 11.2 oz (111.902 kg) IBW/kg (Calculated) : 61.6 Heparin Dosing Weight: 87kg  Vital Signs: Temp: 98.1 F (36.7 C) (04/20 0616) Temp src: Oral (04/20 0616) BP: 192/75 mmHg (04/20 0616) Pulse Rate: 72 (04/20 0616)  Labs:  Recent Labs  01/08/13 1216  01/08/13 1242  01/09/13 0248 01/09/13 0929 01/09/13 1742 01/09/13 2200 01/10/13 0725 01/10/13 1705 01/11/13 0625  HGB 9.5*  --  9.9*  --  8.8*  --   --   --  8.4*  --  7.8*  HCT 28.4*  --  29.0*  --  26.3*  --   --   --  25.3*  --  23.8*  PLT 344  --   --   --  355  --   --   --  312  --  300  APTT 29  --   --   --   --   --   --   --   --   --   --   LABPROT 13.7  --   --   --   --   --   --   --   --   --   --   INR 1.06  --   --   --   --   --   --   --   --   --   --   HEPARINUNFRC  --   --   --   < > 0.43  --   --   --  0.18* 0.35 0.70  CREATININE  --   --  1.80*  --  2.01*  --   --   --  2.06*  --   --   TROPONINI  --   < >  --   --  2.16* 1.66* 0.99* 0.86*  --   --   --   < > = values in this interval not displayed.  Estimated Creatinine Clearance: 38.9 ml/min (by C-G formula based on Cr of 2.06).   Medications:  Heparin @ 1650 units/hr  Assessment: 57yof started on heparin for new onset afib. Also had CP with elevated troponin so was taken to the cath lab and found to have 1v CAD amenable to PCI. Heparin was resumed after cath with plans for PCI next week. Also needs ankle surgery but timing unclear.  Heparin level is at goal, but on the high end. Will decrease rate a little. Hgb has also dropped, but patient reports no bleeding. Platelets are stable.  Goal of Therapy:  Heparin level 0.3-0.7 units/ml Monitor platelets by anticoagulation protocol: Yes    Plan:  1) Decrease heparin to 1550 units/hr 2) Follow up heparin level, CBC in AM ** Patient is concerned that her BP is too high. Consider resuming patient's home meds amlodipine 10mg  daily and chlorthalidone 25mg  daily**  Deboraha Sprang 01/11/2013,7:22 AM

## 2013-01-11 NOTE — Progress Notes (Signed)
FMTS Attending Daily Note: Amberli Ruegg MD [phone] pager office [phone] I have discussed this patient with the resident and reviewed the assessment and plan as documented above. I agree wit the resident's findings and plan.  

## 2013-01-11 NOTE — Consult Note (Signed)
Patient Name: [name] Date of Encounter: 01/11/2013   Active Problems:   Paroxysmal atrial fibrillation, rapid ventricular response   NSTEMI (non-ST elevated myocardial infarction)   Closed right ankle fracture   CAD (coronary artery disease)   Hypertension   Diabetes mellitus   Chronic kidney disease, stage III (moderate)   Syncope   History of stroke   Diabetic neuropathy   SUBJECTIVE  No chest pain or sob.  Ankle feels fine, but r leg stiff 2/2 immobility.  CURRENT MEDS . aspirin  81 mg Oral Daily  . atorvastatin  80 mg Oral QHS  . carvedilol  6.25 mg Oral BID WC  . diltiazem  120 mg Oral Daily  . insulin aspart  0-15 Units Subcutaneous TID WC  . insulin glargine  15 Units Subcutaneous QHS  . isosorbide mononitrate  30 mg Oral Daily  . sodium chloride  3 mL Intravenous Q12H    OBJECTIVE  Filed Vitals:   01/10/13 1735 01/10/13 2100 01/11/13 0616 01/11/13 0740  BP: 179/74 159/63 192/75 205/89  Pulse: 66 73 72 69  Temp:  98.1 F (36.7 C) 98.1 F (36.7 C)   TempSrc:  Oral Oral   Resp:  18 18   Height:      Weight:      SpO2:  96% 97%     Intake/Output Summary (Last 24 hours) at 01/11/13 0911 Last data filed at 01/11/13 0700  Gross per 24 hour  Intake    243 ml  Output   1050 ml  Net   -807 ml   Filed Weights   01/08/13 1131 01/08/13 2035  Weight: 240 lb (108.863 kg) 246 lb 11.2 oz (111.902 kg)    PHYSICAL EXAM  General: Pleasant, NAD. Neuro: Alert and oriented X 3. Moves all extremities spontaneously. Psych: Normal affect. HEENT:  Normal  Neck: Supple without bruits or JVD. Lungs:  Resp regular and unlabored, CTA. Heart: RRR no s3, s4, 2/6 sem rusb. Abdomen: Soft, non-tender, non-distended, BS + x 4.  Extremities: No clubbing, cyanosis or edema. DP/PT/Radials 2+.  Accessory Clinical Findings  CBC  Recent Labs  01/08/13 1216  01/10/13 0725 01/11/13 0625  WBC 10.2  < > 10.8* 10.3  NEUTROABS 8.5*  --   --   --   HGB 9.5*  < >  8.4* 7.8*  HCT 28.4*  < > 25.3* 23.8*  MCV 78.9  < > 79.3 79.9  PLT 344  < > 312 300  < > = values in this interval not displayed. Basic Metabolic Panel  Recent Labs  01/10/13 0725 01/11/13 0625  NA 139 139  K 4.3 4.1  CL 110 110  CO2 19 22  GLUCOSE 136* 126*  BUN 21 26*  CREATININE 2.06* 2.45*  CALCIUM 8.6 8.5   Liver Function Tests  Recent Labs  01/09/13 0248  AST 22  ALT 16  ALKPHOS 89  BILITOT 0.2*  PROT 6.7  ALBUMIN 2.8*   Cardiac Enzymes  Recent Labs  01/09/13 0929 01/09/13 1742 01/09/13 2200  TROPONINI 1.66* 0.99* 0.86*   Hemoglobin A1C  Recent Labs  01/08/13 2306  HGBA1C 7.0*   Fasting Lipid Panel  Recent Labs  01/09/13 0248  CHOL 204*  HDL 40  LDLCALC 135*  TRIG 147  CHOLHDL 5.1   Thyroid Function Tests  Recent Labs  01/08/13 1218  TSH 0.747   TELE  rsr  ASSESSMENT AND PLAN  1.  R ankle Fx:  Pending surgery tomorrow.  2.  NSTEMI/CAD:  No further chest pain.  She remains on heparin/ntg/asa/bb/statin.  Plan for intervention to LCX following ankle surgery. She has become progressively anemic.  In setting of CAD and NSTEMI, she will require transfusion.  3.  HTN:  Poorly controlled.  Titrate bb further.  4.  HL:  LDL 135.  Cont lipitor 80.  5.  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  D/c chlorthalidone.  Transfuse.  6.  Acute on chronic normocytic anemia:  Guaiac stools.  With CAD/NSTEMI, transfuse today.  Watch volume with diastolic dysfxn and htn.  7.  DM:  Cont insulin/ssi.  Signed, Murray Hodgkins NP  Attending Note:   The patient was seen and examined.  Agree with assessment and plan as noted above.  Changes made to the above note as needed.  I had a long discussion with her today.  She WILL NOT go on dialysis.  Given this information, I'm not sure I would proceed with PCI given her lack of symptoms at this point.  We can decide later to do PCI if she develops further  symptoms of UAP or MI.  In addition, she is anemic and her HB is falling.  I think she would benefit from a transfusion before going in for ankle surgery.   Will need to check stools. We may need to DC heparin if she is having a slow GI bleed.    Thayer Headings, Brooke Bonito., MD, [facility] 01/11/2013, 10:03 AM

## 2013-01-11 NOTE — Progress Notes (Signed)
FMTS Daily Intern Progress Note  Subjective:  Feeling well today, concerned about BP, agreeable for surgery and cath. Denies chest pain and dyspnea. No foot pain.   Objective Temp:  [98.1 F (36.7 C)-98.3 F (36.8 C)] 98.1 F (36.7 C) (04/20 0616) Pulse Rate:  [63-76] 72 (04/20 0616) Resp:  [18] 18 (04/20 0616) BP: (149-192)/(63-75) 192/75 mmHg (04/20 0616) SpO2:  [93 %-97 %] 97 % (04/20 0616)   Intake/Output Summary (Last 24 hours) at 01/11/13 0703 Last data filed at 01/11/13 0616  Gross per 24 hour  Intake      3 ml  Output   1050 ml  Net  -1047 ml     General: NAD, sitting at bedside HEENT: EOMI, scler aclear CV: RRR with II/VI syst murmur heard best RUSB, no carotid bruits, [name] appreciated Pulm: CTAB with normal effort, fine crackles bilateral bases Abd: Soft, nontender, nondistended, NABS Ext: Right leg wrapped in ACE bandage, left leg with no LE edema or tenderness, no calf tenderness bilaterally Neuro: Awake, alert, no focal deficits, normal speech  Labs and Imaging  Recent Labs Lab 01/09/13 0248 01/10/13 0725 01/11/13 0625  WBC 12.6* 10.8* 10.3  HGB 8.8* 8.4* 7.8*  HCT 26.3* 25.3* 23.8*  PLT 355 312 300     Recent Labs Lab 01/09/13 0248 01/10/13 0725 01/11/13 0625  NA 138 139 139  K 4.4 4.3 4.1  CL 108 110 110  CO2 21 19 22   BUN 24* 21 26*  CREATININE 2.01* 2.06* 2.45*  GLUCOSE 218* 136* 126*  CALCIUM 8.6 8.6 8.5      Recent Labs Lab 01/08/13 1221 01/09/13 0248 01/09/13 0929 01/09/13 1742 01/09/13 2200  TROPONINI <0.30 2.16* 1.66* 0.99* 0.86*   A1c 7   Recent Labs Lab 01/10/13 0724 01/10/13 1218 01/10/13 1646 01/10/13 2123 01/11/13 0737  GLUCAP 137* 139* 176* 131* 120*     01/09/2013 02:48  Cholesterol 204 (H)  Triglycerides 147  HDL 40  LDL (calc) 135 (H)  VLDL 29  Total CHOL/HDL Ratio 5.1    Repeat EKG this morning with no signs of active ischemia  2D echocardiogram 01/09/13: Study Conclusions - Left  ventricle: The cavity size was normal. Wall thickness was increased in a pattern of mild LVH. Systolic function was normal. The estimated ejection fraction was in the range of 60% to 65%. Wall motion was normal; there were no regional wall motion abnormalities. Doppler parameters are consistent with abnormal left ventricular relaxation (grade 1 diastolic dysfunction). - Left atrium: The atrium was mildly dilated. Transthoracic echocardiography. M-mode, complete 2D, spectral Doppler, and color Doppler. Height: Height: 170.2cm. Height: 67in. Weight: Weight: 111.6kg. Weight: 245.5lb. Body mass index: BMI: 38.5kg/m^2. Body surface area: BSA: 2.63m^2. Blood pressure: 178/80. Patient status: Inpatient. Location: Bedside.  4/18 cardiac catheterization:  1. 1V CAD  2. NSTEMI     Assessment and Plan - [name] is a 57 y.o. year old female with a PMH of hypertension, diabetes, and severe diabetic neuropathy presenting with atrial fibrillation with RVR and a fractured right ankle after a syncopal epidsode 4/17.   # Ankle Fracture - Oblique fracture of distal fibula, s/p reduction and splinting; Occurred 01/08/13  - Plan for surgery once medically cleared   - LCX with 80% lesion, will need PCI later in the week after surgery - Maintain elevated with ice, non weight bearing  - Tylenol and oxycodone for pain PRN   # Atrial Fibrillation with RVR, NSTEMI - Noted A fib on first  EKG in ED, given CHADS Score of 4, patient placed on heparin drip and diltiazem drip; Converted to sinus rhythm now on dilt and BB PO - Cardiology consulted- Appreciate reccs - diltiazenm 120 PO daily, coreg 6.25 BID  - Maintain Heparin drip in preparation of surgery and address anticoagulation needs thereafter  - Telemetry  - troponins elevated up to 2 and now trending down, EKG with no active ischemia - Echo with EF 60-65 and grade 1 diastolic and mildly dilated LA - Cardiac catheterization 4/18 showed tight left  circumflex lesions (tandem 80% lesions in proximal to mid AV groove) that are not critical. Amenable to PCI but would require drug-eluting stent for 61m-19yr  with DM Type II, which would prohibit ankle surgery.   - Ok for surg if chest pain free and will get PCI later in the week  # Syncope - Unknown etiology, but likely atrial fibrillation with RVR vs other dysrhythmia vs orthostatic hypotension vs PE (Wells Score 1.5); CT head ruled out acute bleed. Echo with LVH, grade I diastolic dysfunction, mild atrial dilation with EF 60-65%  - Treating dysrhythmia as noted above  - Cannot check orthostatic vitals given non weight bearing status, but gave IVF to improve vol status  - No need for PE eval since low suspsicion and already  - Carotid doppler and echo to r/o possible causes of CVA   - Carotid doppler without stenosis  - TTE on 4/18 with EF 60-65 % and grade 1 diatolic dysf  # Vascular Disease - Noted to have possible small vessel stroke on right lentiform nucleus which is likely old and does not correlated with current condition  - Continue Lipitor, LDL 135 - increase lipitor to 80 per cards  # Hypertension - Very elevated over 24 hours. P - atient taking carvedilol daily, previously also on amlodipine and chlorthalidone, but ran out months ago  - increase coreg to 12.5, add chlorthalidone, cont cardizem and nitro - Nitro drip per cards reccs  # DM, type 2 with Neurologic Deficits -  - CBGs 131-176 - A1C - 7.0 on 4/17 - Lantus at 20U (home dose 30) and moderate sliding scale   # Acute on chronic CKD Stage III  - likely diabetic nephropathy and worsened by IV contrast - Creat 1.8--> 2.01 -->2.4 - IV fluids to 125 ml/hr - Avoid nephrotoxic agents  - Discuss with patient possible future dialysis- tells cards she doesn't ever want it and they are understandably concerned about PCI if she would refuse if needed  # Anemia, Normocytic  - Chronic - Hgb baseline 10; 9.9 on presentation-->7.8  today, consider transfusing with current NSTEMI - transfusion threshold of 8.0 with NSTEMI  - Transfuse 2 units, also considering OR tomorrow - F/u daily CBC   FENGI - Carb modified diet, NS IVF at 125  PPX - On heparin drip  DISPO - home pending treatment and improvement  Kenn File [phone] 01/11/2013, 7:03 AM

## 2013-01-12 ENCOUNTER — Encounter (HOSPITAL_COMMUNITY)
Admission: EM | Disposition: A | Discharge status: Home Health | Payer: Self-pay | Source: Home / Self Care | Attending: Family Medicine

## 2013-01-12 ENCOUNTER — Encounter (HOSPITAL_COMMUNITY): Payer: Self-pay | Admitting: Anesthesiology

## 2013-01-12 ENCOUNTER — Encounter: Payer: Self-pay | Admitting: Emergency Medicine

## 2013-01-12 ENCOUNTER — Inpatient Hospital Stay (HOSPITAL_COMMUNITY): Payer: MEDICAID | Admitting: Anesthesiology

## 2013-01-12 HISTORY — PX: ORIF ANKLE FRACTURE: SHX5408

## 2013-01-12 LAB — GLUCOSE, CAPILLARY
Glucose-Capillary: 124 mg/dL — ABNORMAL HIGH (ref 70–99)
Glucose-Capillary: 125 mg/dL — ABNORMAL HIGH (ref 70–99)
Glucose-Capillary: 121 mg/dL — ABNORMAL HIGH (ref 70–99)
Glucose-Capillary: 114 mg/dL — ABNORMAL HIGH (ref 70–99)

## 2013-01-12 LAB — CBC
HCT: 28.9 % — ABNORMAL LOW (ref 36.0–46.0)
MCV: 78.1 fL (ref 78.0–100.0)
RDW: 14 % (ref 11.5–15.5)
WBC: 12.5 10*3/uL — ABNORMAL HIGH (ref 4.0–10.5)

## 2013-01-12 LAB — TYPE AND SCREEN

## 2013-01-12 LAB — HEPARIN LEVEL (UNFRACTIONATED): Heparin Unfractionated: 0.59 IU/mL (ref 0.30–0.70)

## 2013-01-12 SURGERY — OPEN REDUCTION INTERNAL FIXATION (ORIF) ANKLE FRACTURE
Anesthesia: General | Site: Ankle | Laterality: Right | Wound class: Clean

## 2013-01-12 MED ORDER — DEXTROSE 5 % IV SOLN
3.0000 g | INTRAVENOUS | Status: AC
  Administered 2013-01-12: 3 g via INTRAVENOUS
  Filled 2013-01-12: qty 3000

## 2013-01-12 MED ORDER — DILTIAZEM HCL ER COATED BEADS 120 MG PO CP24
120.0000 mg | ORAL_CAPSULE | Freq: Once | ORAL | Status: AC
  Administered 2013-01-12: 120 mg via ORAL
  Filled 2013-01-12: qty 1

## 2013-01-12 MED ORDER — CEFAZOLIN SODIUM-DEXTROSE 2-3 GM-% IV SOLR
2.0000 g | Freq: Four times a day (QID) | INTRAVENOUS | Status: DC
  Filled 2013-01-12 (×2): qty 50

## 2013-01-12 MED ORDER — ARTIFICIAL TEARS OP OINT
TOPICAL_OINTMENT | OPHTHALMIC | Status: DC | PRN
  Administered 2013-01-12: 1 via OPHTHALMIC

## 2013-01-12 MED ORDER — DILTIAZEM HCL ER COATED BEADS 120 MG PO CP24
120.0000 mg | ORAL_CAPSULE | Freq: Every day | ORAL | Status: DC
  Administered 2013-01-12: 120 mg via ORAL
  Filled 2013-01-12: qty 1

## 2013-01-12 MED ORDER — PROPOFOL 10 MG/ML IV BOLUS
INTRAVENOUS | Status: DC | PRN
  Administered 2013-01-12: 160 mg via INTRAVENOUS

## 2013-01-12 MED ORDER — DILTIAZEM HCL ER COATED BEADS 240 MG PO CP24
240.0000 mg | ORAL_CAPSULE | Freq: Every day | ORAL | Status: DC
  Administered 2013-01-13 – 2013-01-15 (×3): 240 mg via ORAL
  Filled 2013-01-12 (×3): qty 1

## 2013-01-12 MED ORDER — CEFAZOLIN SODIUM 1-5 GM-% IV SOLN
INTRAVENOUS | Status: AC
  Filled 2013-01-12: qty 50

## 2013-01-12 MED ORDER — CEFAZOLIN SODIUM-DEXTROSE 2-3 GM-% IV SOLR: 2.0000 g | Freq: Three times a day (TID) | INTRAVENOUS | Status: AC

## 2013-01-12 MED ORDER — NITROGLYCERIN IN D5W 200-5 MCG/ML-% IV SOLN
INTRAVENOUS | Status: DC | PRN
  Administered 2013-01-12: 10 ug/min via INTRAVENOUS

## 2013-01-12 MED ORDER — LIDOCAINE HCL (CARDIAC) 20 MG/ML IV SOLN
INTRAVENOUS | Status: DC | PRN
  Administered 2013-01-12: 100 mg via INTRAVENOUS

## 2013-01-12 MED ORDER — METOCLOPRAMIDE HCL 5 MG/ML IJ SOLN
5.0000 mg | Freq: Three times a day (TID) | INTRAMUSCULAR | Status: DC | PRN
  Filled 2013-01-12: qty 2

## 2013-01-12 MED ORDER — METOCLOPRAMIDE HCL 5 MG/ML IJ SOLN: 10.0000 mg | Freq: Once | INTRAMUSCULAR | Status: DC | PRN

## 2013-01-12 MED ORDER — METOCLOPRAMIDE HCL 5 MG PO TABS
5.0000 mg | ORAL_TABLET | Freq: Three times a day (TID) | ORAL | Status: DC | PRN
  Filled 2013-01-12: qty 2

## 2013-01-12 MED ORDER — ACETAMINOPHEN 10 MG/ML IV SOLN
INTRAVENOUS | Status: AC
  Filled 2013-01-12: qty 100

## 2013-01-12 MED ORDER — NITROGLYCERIN 0.2 MG/ML ON CALL CATH LAB
INTRAVENOUS | Status: DC | PRN
  Administered 2013-01-12: 6 mL via INTRAVENOUS

## 2013-01-12 MED ORDER — SUCCINYLCHOLINE CHLORIDE 20 MG/ML IJ SOLN
INTRAMUSCULAR | Status: DC | PRN
  Administered 2013-01-12: 100 mg via INTRAVENOUS

## 2013-01-12 MED ORDER — ONDANSETRON HCL 4 MG PO TABS: 4.0000 mg | ORAL_TABLET | Freq: Four times a day (QID) | ORAL | Status: DC | PRN

## 2013-01-12 MED ORDER — LACTATED RINGERS IV SOLN
INTRAVENOUS | Status: DC | PRN
  Administered 2013-01-12: 17:00:00 via INTRAVENOUS

## 2013-01-12 MED ORDER — ACETAMINOPHEN 10 MG/ML IV SOLN
INTRAVENOUS | Status: DC | PRN
  Administered 2013-01-12: 1000 mg via INTRAVENOUS

## 2013-01-12 MED ORDER — MIDAZOLAM HCL 5 MG/5ML IJ SOLN
INTRAMUSCULAR | Status: DC | PRN
  Administered 2013-01-12: 1 mg via INTRAVENOUS

## 2013-01-12 MED ORDER — ONDANSETRON HCL 4 MG/2ML IJ SOLN
INTRAMUSCULAR | Status: DC | PRN
  Administered 2013-01-12: 4 mg via INTRAVENOUS

## 2013-01-12 MED ORDER — 0.9 % SODIUM CHLORIDE (POUR BTL) OPTIME
TOPICAL | Status: DC | PRN
  Administered 2013-01-12: 1000 mL

## 2013-01-12 MED ORDER — OXYCODONE HCL 5 MG PO TABS: 5.0000 mg | ORAL_TABLET | Freq: Once | ORAL | Status: DC | PRN

## 2013-01-12 MED ORDER — HYDROMORPHONE HCL PF 1 MG/ML IJ SOLN: 0.2500 mg | INTRAMUSCULAR | Status: DC | PRN

## 2013-01-12 MED ORDER — ONDANSETRON HCL 4 MG/2ML IJ SOLN: 4.0000 mg | Freq: Four times a day (QID) | INTRAMUSCULAR | Status: DC | PRN

## 2013-01-12 MED ORDER — CEFAZOLIN SODIUM-DEXTROSE 2-3 GM-% IV SOLR
INTRAVENOUS | Status: AC
  Filled 2013-01-12: qty 50

## 2013-01-12 MED ORDER — HYDRALAZINE HCL 10 MG PO TABS
10.0000 mg | ORAL_TABLET | Freq: Once | ORAL | Status: AC
  Administered 2013-01-12: 10 mg via ORAL
  Filled 2013-01-12: qty 1

## 2013-01-12 MED ORDER — OXYCODONE HCL 5 MG/5ML PO SOLN: 5.0000 mg | Freq: Once | ORAL | Status: DC | PRN

## 2013-01-12 MED ORDER — FENTANYL CITRATE 0.05 MG/ML IJ SOLN
INTRAMUSCULAR | Status: DC | PRN
  Administered 2013-01-12: 50 ug via INTRAVENOUS

## 2013-01-12 SURGICAL SUPPLY — 47 items
BANDAGE ESMARK 6X9 LF (GAUZE/BANDAGES/DRESSINGS) IMPLANT
BANDAGE GAUZE ELAST BULKY 4 IN (GAUZE/BANDAGES/DRESSINGS) ×2 IMPLANT
BIT DRILL 2.5X2.75 QC CALB (BIT) ×2 IMPLANT
BNDG COHESIVE 4X5 TAN STRL (GAUZE/BANDAGES/DRESSINGS) ×2 IMPLANT
BNDG ESMARK 6X9 LF (GAUZE/BANDAGES/DRESSINGS)
BNDG GAUZE STRTCH 6 (GAUZE/BANDAGES/DRESSINGS) ×2 IMPLANT
CLOTH BEACON ORANGE TIMEOUT ST (SAFETY) ×2 IMPLANT
COVER SURGICAL LIGHT HANDLE (MISCELLANEOUS) ×2 IMPLANT
CUFF TOURNIQUET SINGLE 34IN LL (TOURNIQUET CUFF) IMPLANT
CUFF TOURNIQUET SINGLE 44IN (TOURNIQUET CUFF) IMPLANT
DRAPE C-ARM MINI 42X72 WSTRAPS (DRAPES) IMPLANT
DRAPE INCISE IOBAN 66X45 STRL (DRAPES) IMPLANT
DRAPE PROXIMA HALF (DRAPES) ×2 IMPLANT
DRAPE U-SHAPE 47X51 STRL (DRAPES) ×2 IMPLANT
DRSG ADAPTIC 3X8 NADH LF (GAUZE/BANDAGES/DRESSINGS) ×2 IMPLANT
DRSG PAD ABDOMINAL 8X10 ST (GAUZE/BANDAGES/DRESSINGS) ×2 IMPLANT
DURAPREP 26ML APPLICATOR (WOUND CARE) ×2 IMPLANT
ELECT REM PT RETURN 9FT ADLT (ELECTROSURGICAL) ×2
ELECTRODE REM PT RTRN 9FT ADLT (ELECTROSURGICAL) ×1 IMPLANT
GLOVE BIOGEL PI IND STRL 9 (GLOVE) ×1 IMPLANT
GLOVE BIOGEL PI INDICATOR 9 (GLOVE) ×1
GLOVE SURG ORTHO 9.0 STRL STRW (GLOVE) ×2 IMPLANT
GOWN PREVENTION PLUS XLARGE (GOWN DISPOSABLE) ×2 IMPLANT
GOWN SRG XL XLNG 56XLVL 4 (GOWN DISPOSABLE) ×1 IMPLANT
GOWN STRL NON-REIN XL XLG LVL4 (GOWN DISPOSABLE) ×1
KIT BASIN OR (CUSTOM PROCEDURE TRAY) ×2 IMPLANT
KIT ROOM TURNOVER OR (KITS) ×2 IMPLANT
MANIFOLD NEPTUNE II (INSTRUMENTS) IMPLANT
NS IRRIG 1000ML POUR BTL (IV SOLUTION) ×2 IMPLANT
PACK ORTHO EXTREMITY (CUSTOM PROCEDURE TRAY) ×2 IMPLANT
PAD ARMBOARD 7.5X6 YLW CONV (MISCELLANEOUS) ×4 IMPLANT
PADDING CAST COTTON 6X4 STRL (CAST SUPPLIES) ×2 IMPLANT
PLATE LOCK 7H 92 BILAT FIB (Plate) ×2 IMPLANT
SCREW CORTICAL 3.5MM 24MM (Screw) ×2 IMPLANT
SCREW LOCK CORT STAR 3.5X12 (Screw) ×2 IMPLANT
SCREW LOW PROFILE 12MMX3.5MM (Screw) ×4 IMPLANT
SCREW NON LOCKING LP 3.5 14MM (Screw) ×2 IMPLANT
SPONGE GAUZE 4X4 12PLY (GAUZE/BANDAGES/DRESSINGS) ×2 IMPLANT
SPONGE LAP 18X18 X RAY DECT (DISPOSABLE) ×2 IMPLANT
STAPLER VISISTAT 35W (STAPLE) IMPLANT
SUCTION FRAZIER TIP 10 FR DISP (SUCTIONS) ×2 IMPLANT
SUT ETHILON 2 0 PSLX (SUTURE) IMPLANT
SUT VIC AB 2-0 CTB1 (SUTURE) ×4 IMPLANT
TOWEL OR 17X24 6PK STRL BLUE (TOWEL DISPOSABLE) ×2 IMPLANT
TOWEL OR 17X26 10 PK STRL BLUE (TOWEL DISPOSABLE) ×2 IMPLANT
TUBE CONNECTING 12X1/4 (SUCTIONS) ×2 IMPLANT
WATER STERILE IRR 1000ML POUR (IV SOLUTION) ×2 IMPLANT

## 2013-01-12 NOTE — Progress Notes (Addendum)
Interim Progress Note:  Patient with continued elevated BPs. She is on coreg 12.5mg  BID, chlorthalidone 25mg  daily, diltiazem CD 120mg  daily (changed from non-24 hour formulation this AM). With continued elevated BPs, could increase B-blocker or ca-channel blocker or could add ACE-i or ARB. With borderline pulse, do not want to increase beta-blocker. With lisinopril allergy, will not add that. With elevated creatinine likely from dye exposure (with contrast-induced nephropathy, expect improvement of creatinine in 3-5 days from exposure), will not add ARB right now but can consider this in the future. Will increase diltiazem CD to 240mg  daily starting today and continue to monitor.  Conni Slipper 01/12/2013 2:17 PM

## 2013-01-12 NOTE — Progress Notes (Addendum)
Patient Name: [name] Date of Encounter: 01/12/2013   Active Problems:   Paroxysmal atrial fibrillation, rapid ventricular response   Hypertension   Diabetes mellitus   Chronic kidney disease, stage III (moderate)   Syncope   History of stroke   Diabetic neuropathy   NSTEMI (non-ST elevated myocardial infarction)   Closed right ankle fracture   CAD (coronary artery disease)   SUBJECTIVE  No chest pain or sob.    CURRENT MEDS . aspirin  81 mg Oral Daily  . atorvastatin  80 mg Oral QHS  . carvedilol  12.5 mg Oral BID WC  .  ceFAZolin (ANCEF) IV  3 g Intravenous On Call to OR  . chlorthalidone  25 mg Oral Daily  . diltiazem  120 mg Oral Daily  . hydrALAZINE  10 mg Oral Q6H  . insulin aspart  0-15 Units Subcutaneous TID WC  . insulin glargine  15 Units Subcutaneous QHS  . isosorbide mononitrate  30 mg Oral Daily  . sodium chloride  3 mL Intravenous Q12H    OBJECTIVE  Filed Vitals:   01/11/13 2200 01/11/13 2300 01/11/13 2340 01/12/13 0525  BP: 191/78 170/78 170/80 207/76  Pulse: 67 68 68 68  Temp: 97.3 F (36.3 C) 98.1 F (36.7 C) 98.2 F (36.8 C) 98.5 F (36.9 C)  TempSrc: Oral Oral Oral Oral  Resp:    18  Height:      Weight:      SpO2:    98%    Intake/Output Summary (Last 24 hours) at 01/12/13 0823 Last data filed at 01/12/13 0820  Gross per 24 hour  Intake    973 ml  Output   1800 ml  Net   -827 ml   Filed Weights   01/08/13 1131 01/08/13 2035  Weight: 240 lb (108.863 kg) 246 lb 11.2 oz (111.902 kg)    PHYSICAL EXAM  General: Pleasant, NAD. Neuro: Alert and oriented X 3. Moves all extremities spontaneously. HEENT:  Normal  Neck: Supple Lungs:  Resp regular and unlabored, CTA. Heart: RRR no s3, s4, 2/6 sem rusb. Abdomen: Soft, non-tender, non-distended, BS + x 4.  Extremities: No edema.   Accessory Clinical Findings  CBC  Recent Labs  01/11/13 0625 01/12/13 0445  WBC 10.3 12.5*  HGB 7.8* 9.7*  HCT 23.8* 28.9*  MCV 79.9  78.1  PLT 300 [id number]   Basic Metabolic Panel  Recent Labs  01/10/13 0725 01/11/13 0625  NA 139 139  K 4.3 4.1  CL 110 110  CO2 19 22  GLUCOSE 136* 126*  BUN 21 26*  CREATININE 2.06* 2.45*  CALCIUM 8.6 8.5   Cardiac Enzymes  Recent Labs  01/09/13 0929 01/09/13 1742 01/09/13 2200  TROPONINI 1.66* 0.99* 0.86*   TELE sinus  ASSESSMENT AND PLAN  1.  R ankle Fx:  Pending surgery today. Her risk is elevated due to recent atrial fibrillation and small NSTEMI. PCI was not pursued preop as she would require DES and dual antiplt therapy which would delay repair of ankle fx.  2.  NSTEMI/CAD:  No further chest pain.  She remains on heparin/ntg/asa/bb/statin.  Will review films with DB; plan PCI vs medical therapy following repair of ankle fx.  3.  HTN:  Poorly controlled.  Continue present meds and increase as needed.  4.  HL:  LDL 135.  Cont lipitor 80.  5.  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 Transfuse.  6.  Acute on chronic normocytic anemia:  Guaiac stools.  With CAD/NSTEMI, follow and transfuse to keep Hgb approximately 10.  Watch volume with diastolic dysfxn and htn.  7.  DM:  Cont insulin/ssi.  8. PAF - continue cardizem and beta blocker; DC heparin prior to surgery and resume postop when ok with orthopedics. Will need long term anticoagulation once all procedures complete.  Signed, Kirk Ruths MD

## 2013-01-12 NOTE — Op Note (Signed)
OPERATIVE REPORT  DATE OF SURGERY: 01/12/2013  PATIENT:  [name],  57 y.o. female  PRE-OPERATIVE DIAGNOSIS:  Right ankle Weber B. fibular fracture with dislocated ankle  POST-OPERATIVE DIAGNOSIS:  Same  PROCEDURE:  Procedure(s): OPEN REDUCTION INTERNAL FIXATION (ORIF) ANKLE FRACTURE  SURGEON:  Surgeon(s): Newt Minion, MD  ANESTHESIA:   general  EBL:  Minimal ML  SPECIMEN:  No Specimen  TOURNIQUET:  * No tourniquets in log *  PROCEDURE DETAILS: Patient is a 57 year old woman who had a non-ST segment elevation myocardial infarction. She was evaluated by cardiology and felt to be stable at this time for open reduction internal fixation of the ankle fracture. Risks and benefits were discussed with the patient including infection neurovascular injury pain heart attack need for additional surgery. Patient states she understands was to proceed at this time. Description of procedure patient was brought to the operating room and underwent a general anesthetic. After adequate levels of anesthesia were obtained patient's right lower extremity was prepped using DuraPrep and draped into a sterile field. A lateral incision was made over the fibula this was carried down to the fracture site. The fracture edges were freshened the fracture was reduced and stabilized with a 24 mm lag screw. A lateral plate was then contoured and secured proximally with 3 compression screws and distally with 2 locking screws. C-arm fluoroscopy verified reduction. The wound was irrigated with normal saline throughout the case. The subcutaneous is closed using 2-0 Vicryl the skin was closed using staples. The wound is covered with Adaptic orthopedic sponges AB dressing Kerlix and Coban. Patient was extubated taken to the PACU in stable condition. Of note patient's heparin was not discontinued through her surgical intervention.  PLAN OF CARE: Admit to inpatient   PATIENT DISPOSITION:  PACU - hemodynamically  stable.   Newt Minion, MD 01/12/2013 6:06 PM

## 2013-01-12 NOTE — Progress Notes (Signed)
FMTS Daily Intern Progress Note  Subjective:  Ms [name] denies chest pain or shortness of breath today and has been NPO since midnight in preparation for right ankle fracture repair today. Denies bleeding.  I have reviewed patient's medications. PRN tylenol last night.  Objective Temp:  [97.3 F (36.3 C)-98.8 F (37.1 C)] 98.5 F (36.9 C) (04/21 0525) Pulse Rate:  [59-70] 70 (04/21 0858) Resp:  [18] 18 (04/21 0525) BP: (133-209)/(68-98) 194/92 mmHg (04/21 0858) SpO2:  [93 %-98 %] 98 % (04/21 0525)   Intake/Output Summary (Last 24 hours) at 01/12/13 0932 Last data filed at 01/12/13 0820  Gross per 24 hour  Intake    973 ml  Output   1800 ml  Net   -827 ml     General: NAD, sitting at bedside, pleasant HEENT: EOMI, scler aclear CV: RRR with II/VI syst murmur heard best RUSB, no carotid bruits, [name] appreciated Pulm: CTAB with normal effort, fine crackles bilateral bases stable since admission Abd: Soft, nontender, nondistended, NABS Ext: Right leg wrapped in ACE bandage/casted, left leg with no LE edema or tenderness Neuro: Awake, alert, no focal deficits, normal speech  Labs and Imaging  Recent Labs Lab 01/10/13 0725 01/11/13 0625 01/12/13 0445  WBC 10.8* 10.3 12.5*  HGB 8.4* 7.8* 9.7*  HCT 25.3* 23.8* 28.9*  PLT 312 300 323     Recent Labs Lab 01/09/13 0248 01/10/13 0725 01/11/13 0625  NA 138 139 139  K 4.4 4.3 4.1  CL 108 110 110  CO2 21 19 22   BUN 24* 21 26*  CREATININE 2.01* 2.06* 2.45*  GLUCOSE 218* 136* 126*  CALCIUM 8.6 8.6 8.5      Recent Labs Lab 01/08/13 1221 01/09/13 0248 01/09/13 0929 01/09/13 1742 01/09/13 2200  TROPONINI <0.30 2.16* 1.66* 0.99* 0.86*   A1c 7   Recent Labs Lab 01/11/13 1146 01/11/13 1654 01/11/13 2152 01/11/13 2345 01/12/13 0804  GLUCAP 142* 146* 149* 124* 125*     01/09/2013 02:48  Cholesterol 204 (H)  Triglycerides 147  HDL 40  LDL (calc) 135 (H)  VLDL 29  Total CHOL/HDL Ratio 5.1     Repeat EKG this morning with no signs of active ischemia  2D echocardiogram 01/09/13: Study Conclusions - Left ventricle: The cavity size was normal. Wall thickness was increased in a pattern of mild LVH. Systolic function was normal. The estimated ejection fraction was in the range of 60% to 65%. Wall motion was normal; there were no regional wall motion abnormalities. Doppler parameters are consistent with abnormal left ventricular relaxation (grade 1 diastolic dysfunction). - Left atrium: The atrium was mildly dilated. Transthoracic echocardiography. M-mode, complete 2D, spectral Doppler, and color Doppler. Height: Height: 170.2cm. Height: 67in. Weight: Weight: 111.6kg. Weight: 245.5lb. Body mass index: BMI: 38.5kg/m^2. Body surface area: BSA: 2.16m^2. Blood pressure: 178/80. Patient status: Inpatient. Location: Bedside.  4/18 cardiac catheterization:  1. 1V CAD  2. NSTEMI   4/19 Carotid doppler - There is no obvious evidence of hemodynamically significant internal carotid artery stenosis >40%. The right vertebral artery is patent with antegrade flow. Unable to visualize the left vertebral artery.  Incidental finding:  There is a vascular structure visualized at the superior left thyroid lobe with low resistant flow.   Assessment and Plan - [name] is a 57 y.o. year old female with a PMH of hypertension, diabetes, and severe diabetic neuropathy presenting with atrial fibrillation with RVR and a fractured right ankle after a syncopal epidsode 4/17.   #  Ankle Fracture - Oblique fracture of distal fibula, s/p reduction and splinting; Occurred 01/08/13  - Plan for surgery once medically cleared, 4/21 after 5pm if chest pain free per ortho. - Maintain elevated with ice, non weight bearing  - Tylenol and oxycodone for pain PRN   # Atrial Fibrillation with RVR, NSTEMI - Noted A fib on first EKG in ED, given CHADS Score of 4, patient placed on heparin drip and diltiazem  drip; Converted to sinus rhythm now on PO dilt and BB  - Cardiology consulted- Appreciate recs - diltiazen 120 PO daily, coreg increased to 12.5 BID, continue heparin gtt, nitro gtt, aspirin, statin increased to atorvastatin 80  - Address anticoagulation needs after surgery - Telemetry  - troponins elevated up to 2 and now trending down, EKG with no active ischemia - Echo with EF 60-65 and grade 1 diastolic and mildly dilated LA - Cardiac catheterization 4/18 showed tight left circumflex lesions (tandem 80% lesions in proximal to mid AV groove) that are not critical. Amenable to PCI but would require drug-eluting stent for 28m-58yr  with DM Type II - will discuss after ankle surgery   - Ok for ankle surg if chest pain free and will get PCI later in the week - Likely needs anticoagulation long-term. Will discuss after decision re: ankle surgery; per ortho note today, no change in anticoagulation med prior to ankle surg  # Syncope - Unknown etiology, but likely atrial fibrillation with RVR vs other dysrhythmia vs orthostatic hypotension vs PE (Wells Score 1.5); CT head ruled out acute bleed. Echo with LVH, grade I diastolic dysfunction, mild atrial dilation with EF 60-65%  - Treating dysrhythmia as noted above  - Cannot check orthostatic vitals given non weight bearing status, but gave IVF to improve vol status  - No need for PE eval since low suspsicion and already  - Carotid doppler without stenosis - TTE on 4/18 with EF 60-65 % and grade 1 diatolic dysf  # Vascular Disease - Noted to have possible small vessel stroke on right lentiform nucleus which is likely old and does not correlated with current condition  - Continue Lipitor, LDL 135 - increase lipitor to 80 per cards  # Hypertension - Very elevated over 24 hours.  - Patient taking carvedilol daily, previously also on amlodipine and chlorthalidone, but ran out months ago  - increase coreg to 12.5mg  BID, add chlorthalidone, changed cardizem  today to extended release formulation, and continue nitro drip per cards recs - If remains elevated, consider adding home amlodipine 10mg  or may need to consider increasing nitro drip and transferring to SDU for this  # DM, type 2 with Neurologic Deficits -  - CBGs 131-176 - A1C - 7.0 on 4/17 - Lantus at 20U (home dose 30) and moderate sliding scale   # Acute on chronic CKD Stage III  - likely diabetic nephropathy and worsened by IV contrast - Creat 1.8--> 2.06 -->2.4 (CrCl 44.7 calculated) - IV fluids to 125 ml/hr - Avoid nephrotoxic agents  - Discuss with patient possible future dialysis- tells cards she doesn't ever want it and they are understandably concerned about PCI if she would refuse if needed  # Anemia, Normocytic  - Chronic - Hgb baseline 10; 9.9 on presentation-->7.8 4/20 and received transfusion in setting of NSTEMI, now 9.7 - transfusion threshold of 8.0 with NSTEMI - F/u daily CBC  - Ordered hemoccult to eval if slow GI bleed. If positive, may need to d/c heparin  FENGI -  NPO since midnight for ankle surg today, NS IVF at 125 PPX - On heparin drip  DISPO - home pending treatment and improvement; ?cardiac rehab?  Conni Slipper [phone] 01/12/2013, 9:32 AM

## 2013-01-12 NOTE — Progress Notes (Signed)
I discussed with Dr Dianah Field.  I agree with their plans documented in their progress note for today.

## 2013-01-12 NOTE — Anesthesia Postprocedure Evaluation (Signed)
  Anesthesia Post-op Note  Patient: [name]  Procedure(s) Performed: Procedure(s): OPEN REDUCTION INTERNAL FIXATION (ORIF) ANKLE FRACTURE (Right)  Patient Location: PACU  Anesthesia Type:General  Level of Consciousness: awake and alert   Airway and Oxygen Therapy: Patient Spontanous Breathing and Patient connected to nasal cannula oxygen  Post-op Pain: mild  Post-op Assessment: Post-op Vital signs reviewed, Patient's Cardiovascular Status Stable, Respiratory Function Stable, Patent Airway and No signs of Nausea or vomiting  Post-op Vital Signs: Reviewed and stable  Complications: No apparent anesthesia complications

## 2013-01-12 NOTE — Anesthesia Procedure Notes (Signed)
Procedure Name: Intubation Date/Time: 01/12/2013 5:35 PM Performed by: Wanita Chamberlain Pre-anesthesia Checklist: Patient identified, Timeout performed, Emergency Drugs available, Suction available and Patient being monitored Patient Re-evaluated:Patient Re-evaluated prior to inductionOxygen Delivery Method: Circle system utilized Preoxygenation: Pre-oxygenation with 100% oxygen Intubation Type: IV induction, Rapid sequence and Cricoid Pressure applied Laryngoscope Size: Mac and 3 Grade View: Grade II Tube size: 7.0 mm Number of attempts: 1 Airway Equipment and Method: Video-laryngoscopy and Rigid stylet Placement Confirmation: ETT inserted through vocal cords under direct vision,  positive ETCO2 and breath sounds checked- equal and bilateral Secured at: 23 cm Tube secured with: Tape Dental Injury: Teeth and Oropharynx as per pre-operative assessment  Difficulty Due To: Difficulty was anticipated, Difficult Airway- due to large tongue, Difficult Airway- due to reduced neck mobility and Difficult Airway- due to dentition Future Recommendations: Recommend- induction with short-acting agent, and alternative techniques readily available Comments: Thick neck, + goiter

## 2013-01-12 NOTE — Progress Notes (Signed)
ANTICOAGULATION CONSULT NOTE - Follow Up Consult  Pharmacy Consult for Heparin Indication: atrial fibrillation  Allergies  Allergen Reactions  . Lisinopril Other (See Comments)    coughing    Patient Measurements: Height: 5\' 7"  (170.2 cm) Weight: 246 lb 11.2 oz (111.902 kg) IBW/kg (Calculated) : 61.6 Heparin Dosing Weight: 87kg  Vital Signs: Temp: 98.5 F (36.9 C) (04/21 0525) Temp src: Oral (04/21 0525) BP: 194/92 mmHg (04/21 0858) Pulse Rate: 70 (04/21 0858)  Labs:  Recent Labs  01/09/13 0929 01/09/13 1742 01/09/13 2200  01/10/13 0725 01/10/13 1705 01/11/13 0625 01/12/13 0445  HGB  --   --   --   < > 8.4*  --  7.8* 9.7*  HCT  --   --   --   --  25.3*  --  23.8* 28.9*  PLT  --   --   --   --  312  --  300 323  HEPARINUNFRC  --   --   --   < > 0.18* 0.35 0.70 0.59  CREATININE  --   --   --   --  2.06*  --  2.45*  --   TROPONINI 1.66* 0.99* 0.86*  --   --   --   --   --   < > = values in this interval not displayed.  Estimated Creatinine Clearance: 32.7 ml/min (by C-G formula based on Cr of 2.45).   Medications:  Heparin @ 1550 units/hr  Assessment: 57 yo F started on heparin for new onset afib. Also had CP with elevated troponin so was taken to the cath lab and found to have 1v CAD amenable to PCI. Heparin was resumed after cath with plans for PCI after ORIF of ankle (planned for 4/21 PM) due to need for dual antiplatelet therapy after stent.  Heparin level is within goal. Hgb has improved after transfusion.  Platelets are stable.  Goal of Therapy:  Heparin level 0.3-0.7 units/ml Monitor platelets by anticoagulation protocol: Yes   Plan:  1) Continue heparin to 1550 units/hr 2) Follow up post-op ORIF with regards to restarting anticoagulation. 3) Heparin level, CBC in AM  [employer], Pharm.D., BCPS Clinical Pharmacist Pager [phone] 01/12/2013 9:32 AM

## 2013-01-12 NOTE — Preoperative (Addendum)
Beta Blockers   Reason not to administer Beta Blockers:Took Coreg on 01-08-13     @HS 

## 2013-01-12 NOTE — Progress Notes (Signed)
Patient ID: [name], female   DOB: [birth year], 57 y.o.   MRN: [id number] I have reviewed the notes from cardiology. Will plan for open reduction internal fixation right ankle after 5 PM today.  Do not have to change anticoagulation medication for ankle surgery.

## 2013-01-12 NOTE — Anesthesia Preprocedure Evaluation (Addendum)
Anesthesia Evaluation  Patient identified by MRN, date of birth, ID band Patient awake    Reviewed: Allergy & Precautions, H&P , NPO status , Patient's Chart, lab work & pertinent test results, reviewed documented beta blocker date and time   Airway Mallampati: III TM Distance: <3 FB Neck ROM: full    Dental  (+) Teeth Intact and Dental Advisory Given   Pulmonary neg pulmonary ROS, former smoker,  4-17-IMPRESSION: Low lung volumes with bibasilar atelectasis (especially on the left).  Stable cardiomegaly.       breath sounds clear to auscultation        Cardiovascular hypertension, Pt. on medications and Pt. on home beta blockers + CAD and + Past MI + dysrhythmias Atrial Fibrillation Rhythm:regular Rate:Normal  01-09-13 2D ECHO ------------------------------------------------------------ Left ventricle:  The cavity size was normal. Wall thickness was increased in a pattern of mild LVH. Systolic function was normal. The estimated ejection fraction was in the range of 60% to 65%. Wall motion was normal; there were no regional wall motion abnormalities. Doppler parameters are consistent with abnormal left ventricular relaxation (grade 1 diastolic dysfunction).     Neuro/Psych CVA, No Residual Symptoms negative neurological ROS  negative psych ROS   GI/Hepatic negative GI ROS, Neg liver ROS,   Endo/Other  negative endocrine ROSdiabetes, Type 2, Insulin DependentHypothyroidism Morbid obesity  Renal/GU Renal InsufficiencyRenal diseasenegative Renal ROS  negative genitourinary   Musculoskeletal  (+) Arthritis -, Osteoarthritis,    Abdominal   Peds  Hematology negative hematology ROS (+) Blood dyscrasia, Sickle cell trait ,   Anesthesia Other Findings See surgeon's H&P  Airway thick neck  +goiter pres.  Reproductive/Obstetrics negative OB ROS                         Anesthesia Physical Anesthesia  Plan  ASA: IV  Anesthesia Plan: General   Post-op Pain Management:    Induction: Intravenous  Airway Management Planned: Oral ETT  Additional Equipment:   Intra-op Plan:   Post-operative Plan: Extubation in OR  Informed Consent: I have reviewed the patients History and Physical, chart, labs and discussed the procedure including the risks, benefits and alternatives for the proposed anesthesia with the patient or authorized representative who has indicated his/her understanding and acceptance.   Dental Advisory Given  Plan Discussed with: CRNA and Surgeon  Anesthesia Plan Comments: (Patient on heparin, unable to perform regional anesthesia.  Case discussed with Dr. Stanford Breed who authorizes [place] to proceed before intervention.  CF)      Anesthesia Quick Evaluation

## 2013-01-12 NOTE — Transfer of Care (Signed)
Immediate Anesthesia Transfer of Care Note  Patient: [name]  Procedure(s) Performed: Procedure(s): OPEN REDUCTION INTERNAL FIXATION (ORIF) ANKLE FRACTURE (Right)  Patient Location: PACU  Anesthesia Type:General  Level of Consciousness: awake, alert , oriented and patient cooperative  Airway & Oxygen Therapy: Patient Spontanous Breathing and Patient connected to nasal cannula oxygen  Post-op Assessment: Report given to PACU RN and Post -op Vital signs reviewed and stable  Post vital signs: Reviewed and stable  Complications: No apparent anesthesia complications

## 2013-01-13 ENCOUNTER — Encounter (HOSPITAL_COMMUNITY): Payer: Self-pay | Admitting: Orthopedic Surgery

## 2013-01-13 LAB — BASIC METABOLIC PANEL
BUN: 20 mg/dL (ref 6–23)
Creatinine, Ser: 2.05 mg/dL — ABNORMAL HIGH (ref 0.50–1.10)
GFR calc Af Amer: 30 mL/min — ABNORMAL LOW (ref >90)
GFR calc non Af Amer: 26 mL/min — ABNORMAL LOW (ref >90)
Glucose, Bld: 136 mg/dL — ABNORMAL HIGH (ref 70–99)

## 2013-01-13 LAB — CBC
HCT: 28.9 % — ABNORMAL LOW (ref 36.0–46.0)
MCH: 26.9 pg (ref 26.0–34.0)
MCHC: 34.3 g/dL (ref 30.0–36.0)
MCV: 78.5 fL (ref 78.0–100.0)
Platelets: 314 10*3/uL (ref 150–400)
RDW: 14.1 % (ref 11.5–15.5)

## 2013-01-13 LAB — GLUCOSE, CAPILLARY
Glucose-Capillary: 134 mg/dL — ABNORMAL HIGH (ref 70–99)
Glucose-Capillary: 164 mg/dL — ABNORMAL HIGH (ref 70–99)
Glucose-Capillary: 147 mg/dL — ABNORMAL HIGH (ref 70–99)

## 2013-01-13 MED ORDER — APIXABAN 5 MG PO TABS
5.0000 mg | ORAL_TABLET | Freq: Two times a day (BID) | ORAL | Status: DC
  Filled 2013-01-13 (×2): qty 1

## 2013-01-13 MED ORDER — NITROGLYCERIN 0.4 MG SL SUBL: 0.4000 mg | SUBLINGUAL_TABLET | SUBLINGUAL | Status: DC | PRN

## 2013-01-13 NOTE — Consult Note (Signed)
Physical Medicine and Rehabilitation Consult Reason for Consult: Right ankle fibular fracture with dislocation/NSTEMI Referring Physician: [facility]   HPI: [name] is a 57 y.o. right-handed female with history of hypertension, diabetes mellitus and chronic renal insufficiency with creatinine 2.45. Admitted 01/08/2013 after syncopal event resulting in a fall. According to the patient she awoke with global headache slight pressure in the center of her chest and some nausea. She went to the bathroom with noted vomiting and syncopal event. It was witnessed by her son with whom she lives. She struck the back of her head on a dresser but awoke almost immediately with complaints of right ankle pain. Cranial CT scan showed new ill-defined low density in the right lentiform nucleus, likely an age indeterminate small vessel stroke no other acute changes. Upon arrival to the ED patient in atrial fibrillation with RVR with a rate of 130. She was placed on Cardizem drip. X-rays and imaging revealed right ankle Weber B. fibular fracture with dislocated ankle. Cardiology consulted EKG consistent with NSTEMI. Echocardiogram with ejection fraction of 65% no wall motion abnormalities there was grade 1 diastolic dysfunction. Carotid Dopplers with no ICA stenosis. She was maintained on heparin drip for short time. Underwent cardiac catheterization 01/09/2013 with medical management. Patient stabilized from a cardiac standpoint and underwent ORIF of right ankle fracture 01/12/2013 per Dr. Sharol Given. Noted hospital course anemia 7.8 transfused with latest hemoglobin 9.9. Patient is touchdown weightbearing right lower extremity. Await physical and occupational therapy evaluations. M.D. is requested physical medicine rehabilitation consult to consider inpatient rehabilitation services No pain c/os Grown son stays at home not employed able to assist  Review of Systems  Cardiovascular: Positive for chest pain.  Gastrointestinal:  Positive for nausea.  Musculoskeletal: Positive for falls.  Neurological:       Syncope  All other systems reviewed and are negative.   Past Medical History  Diagnosis Date  . Hypertension   . Diabetes mellitus   . Chronic kidney disease, stage III (moderate)    Past Surgical History  Procedure Laterality Date  . Laser surgery for diabetic retinopathy    . Abdominal hysterectomy    . Orif ankle fracture Right 01/12/2013    Procedure: OPEN REDUCTION INTERNAL FIXATION (ORIF) ANKLE FRACTURE;  Surgeon: Newt Minion, MD;  Location: [place];  Service: Orthopedics;  Laterality: Right;   Family History  Problem Relation Age of Onset  . Hypertension Father     Also had CAD  . Diabetes Mother     No history CAD   Social History:  reports that she has never smoked. She does not have any smokeless tobacco history on file. She reports that she does not drink alcohol. Her drug history is not on file. Allergies:  Allergies  Allergen Reactions  . Lisinopril Other (See Comments)    coughing   Medications Prior to Admission  Medication Sig Dispense Refill  . amLODipine (NORVASC) 10 MG tablet Take 10 mg by mouth daily.      Marland Kitchen aspirin EC 81 MG tablet Take 81 mg by mouth daily.      Marland Kitchen atorvastatin (LIPITOR) 40 MG tablet Take 40 mg by mouth at bedtime.      . carvedilol (COREG) 6.25 MG tablet Take 6.25 mg by mouth 2 (two) times daily with a meal.      . chlorthalidone (HYGROTON) 25 MG tablet Take 25 mg by mouth daily.      . insulin glargine (LANTUS) 100 UNIT/ML injection Inject 30 Units  into the skin at bedtime.      . insulin regular (NOVOLIN R,HUMULIN R) 100 units/mL injection Inject 10-20 Units into the skin 3 (three) times daily before meals. Sliding scale of insulin dosage       . Multiple Vitamin (MULTIVITAMIN WITH MINERALS) TABS Take 1 tablet by mouth daily.        Home:    Functional History:   Functional Status:  Mobility:          ADL:     Cognition: Cognition Orientation Level: Oriented X4    Blood pressure 188/82, pulse 68, temperature 98.2 F (36.8 C), temperature source Oral, resp. rate 18, height 5\' 7"  (1.702 m), weight 111.902 kg (246 lb 11.2 oz), SpO2 95.00%. Physical Exam  Vitals reviewed. Constitutional: She is oriented to person, place, and time.  HENT:  Head: Normocephalic.  Eyes: EOM are normal.  Neck: Normal range of motion. Neck supple. No thyromegaly present.  Cardiovascular:  Cardiac rate control  Pulmonary/Chest: Breath sounds normal. She is in respiratory distress.  Abdominal: Soft. Bowel sounds are normal. She exhibits no distension. There is no tenderness.  Neurological: She is alert and oriented to person, place, and time.  Follows full commands  Skin:  CAM boot in place to right lower extremity.  Psychiatric: She has a normal mood and affect.  5/5 strength BUE 4/5 strength LLE 3-/5 strength R HF and KE, unable to test ankle Sensation reduce to LT in toes of both feet  Results for orders placed during the hospital encounter of 01/08/13 (from the past 24 hour(s))  GLUCOSE, CAPILLARY     Status: None   Collection Time    01/12/13  4:54 PM      Result Value Range   Glucose-Capillary 96  70 - 99 mg/dL  GLUCOSE, CAPILLARY     Status: Abnormal   Collection Time    01/12/13  6:31 PM      Result Value Range   Glucose-Capillary 114 (*) 70 - 99 mg/dL  GLUCOSE, CAPILLARY     Status: Abnormal   Collection Time    01/12/13  9:46 PM      Result Value Range   Glucose-Capillary 114 (*) 70 - 99 mg/dL   Comment 1 Notify RN    CBC     Status: Abnormal   Collection Time    01/13/13  4:30 AM      Result Value Range   WBC 12.4 (*) 4.0 - 10.5 K/uL   RBC 3.68 (*) 3.87 - 5.11 MIL/uL   Hemoglobin 9.9 (*) 12.0 - 15.0 g/dL   HCT 28.9 (*) 36.0 - 46.0 %   MCV 78.5  78.0 - 100.0 fL   MCH 26.9  26.0 - 34.0 pg   MCHC 34.3  30.0 - 36.0 g/dL   RDW 14.1  11.5 - 15.5 %   Platelets 314  150 - 400 K/uL   HEPARIN LEVEL (UNFRACTIONATED)     Status: None   Collection Time    01/13/13  4:30 AM      Result Value Range   Heparin Unfractionated 0.70  0.30 - 0.70 IU/mL  BASIC METABOLIC PANEL     Status: Abnormal   Collection Time    01/13/13  4:30 AM      Result Value Range   Sodium 142  135 - 145 mEq/L   Potassium 4.2  3.5 - 5.1 mEq/L   Chloride 109  96 - 112 mEq/L   CO2 21  19 -  32 mEq/L   Glucose, Bld 136 (*) 70 - 99 mg/dL   BUN 20  6 - 23 mg/dL   Creatinine, Ser 2.05 (*) 0.50 - 1.10 mg/dL   Calcium 8.7  8.4 - 10.5 mg/dL   GFR calc non Af Amer 26 (*) >90 mL/min   GFR calc Af Amer 30 (*) >90 mL/min  VITAMIN D 25 HYDROXY     Status: Abnormal   Collection Time    01/13/13  4:30 AM      Result Value Range   Vit D, 25-Hydroxy 17 (*) 30 - 89 ng/mL  GLUCOSE, CAPILLARY     Status: Abnormal   Collection Time    01/13/13  7:43 AM      Result Value Range   Glucose-Capillary 134 (*) 70 - 99 mg/dL  GLUCOSE, CAPILLARY     Status: Abnormal   Collection Time    01/13/13 11:47 AM      Result Value Range   Glucose-Capillary 164 (*) 70 - 99 mg/dL   No results found.  Assessment/Plan: Diagnosis: R fibular fx s/p ORIF TDWB, 1. Does the need for close, 24 hr/day medical supervision in concert with the patient's rehab needs make it unreasonable for this patient to be served in a less intensive setting? Yes 2. Co-Morbidities requiring supervision/potential complications: recent NSTEMI,diabetic neuropathy severe, Afib RVR, CAD 3. Due to bowel management, safety, skin/wound care, disease management, medication administration, pain management and patient education, does the patient require 24 hr/day rehab nursing? Yes 4. Does the patient require coordinated care of a physician, rehab nurse, PT (1-2 hrs/day, 5 days/week) and OT (1-2 hrs/day, 5 days/week) to address physical and functional deficits in the context of the above medical diagnosis(es)? Yes Addressing deficits in the following areas: balance,  endurance, locomotion, strength, transferring, bowel/bladder control, bathing, dressing, feeding, grooming and toileting 5. Can the patient actively participate in an intensive therapy program of at least 3 hrs of therapy per day at least 5 days per week? Yes 6. The potential for patient to make measurable gains while on inpatient rehab is excellent 7. Anticipated functional outcomes upon discharge from inpatient rehab are mod I mob with PT, Mod I ADL with OT, NA with SLP. 8. Estimated rehab length of stay to reach the above functional goals is: 7-10 d 9. Does the patient have adequate social supports to accommodate these discharge functional goals? Yes 10. Anticipated D/C setting: Home 11. Anticipated post D/C treatments: [place] therapy 12. Overall Rehab/Functional Prognosis: excellent  RECOMMENDATIONS: This patient's condition is appropriate for continued rehabilitative care in the following setting: CIR Patient has agreed to participate in recommended program. Yes Note that insurance prior authorization may be required for reimbursement for recommended care.  Comment:    01/13/2013

## 2013-01-13 NOTE — Progress Notes (Signed)
   Patient Name: [name] Date of Encounter: 01/13/2013   Active Problems:   Paroxysmal atrial fibrillation, rapid ventricular response   Hypertension   Diabetes mellitus   Chronic kidney disease, stage III (moderate)   Syncope   History of stroke   Diabetic neuropathy   NSTEMI (non-ST elevated myocardial infarction)   Closed right ankle fracture   CAD (coronary artery disease)   SUBJECTIVE  No chest pain or sob.    CURRENT MEDS . aspirin  81 mg Oral Daily  . atorvastatin  80 mg Oral QHS  . carvedilol  12.5 mg Oral BID WC  . chlorthalidone  25 mg Oral Daily  . diltiazem  240 mg Oral Daily  . hydrALAZINE  10 mg Oral Q6H  . insulin aspart  0-15 Units Subcutaneous TID WC  . insulin glargine  15 Units Subcutaneous QHS  . isosorbide mononitrate  30 mg Oral Daily  . sodium chloride  3 mL Intravenous Q12H    OBJECTIVE  Filed Vitals:   01/12/13 2158 01/12/13 2357 01/13/13 0400 01/13/13 0544  BP: 159/86 173/78 174/72 175/76  Pulse: 100  95   Temp:   97.9 F (36.6 C)   TempSrc:   Oral   Resp:   18   Height:      Weight:      SpO2:   96%     Intake/Output Summary (Last 24 hours) at 01/13/13 0624 Last data filed at 01/12/13 1842  Gross per 24 hour  Intake    550 ml  Output     25 ml  Net    525 ml   Filed Weights   01/08/13 1131 01/08/13 2035  Weight: 240 lb (108.863 kg) 246 lb 11.2 oz (111.902 kg)    PHYSICAL EXAM  General: Pleasant, NAD. Neuro: Alert and oriented X 3. Moves all extremities spontaneously. HEENT:  Normal  Neck: Supple Lungs:  Resp regular and unlabored, CTA. Heart: RRR no s3, s4, 2/6 sem rusb. Abdomen: Soft, non-tender, non-distended, BS + x 4.  Extremities: No edema. S/P surgery R ankle  Accessory Clinical Findings  CBC  Recent Labs  01/12/13 0445 01/13/13 0430  WBC 12.5* 12.4*  HGB 9.7* 9.9*  HCT 28.9* 28.9*  MCV 78.1 78.5  PLT 323 [id number]   Basic Metabolic Panel  Recent Labs  01/11/13 0625 01/13/13 0430  NA 139 142   K 4.1 4.2  CL 110 109  CO2 22 21  GLUCOSE 126* 136*  BUN 26* 20  CREATININE 2.45* 2.05*  CALCIUM 8.5 8.7   TELE sinus  ASSESSMENT AND PLAN  1.  R ankle Fx:  Management per orthopedics.  2.  NSTEMI/CAD:  No further chest pain.  Reviewed films yesterday; plan medical therapy for now; proceed with PCI if CP despite meds. Continue ASA 81 mg daily, coreg, cardizem, nitrates and statin.  3.  HTN:  Poorly controlled. Cardizem to be increased this AM. Follow BP and add meds as needed.  4.  HL:  LDL 135.  Cont lipitor 80.  5.  Acute on chronic stage III kidney disease:  Renal function stable this AM  6.  Acute on chronic normocytic anemia:  H/H stable this AM  7.  DM:  Cont insulin/ssi.  8. PAF - continue cardizem and beta blocker; DC heparin this PM and begin apixaban 5 mg po BID in AM if ok with orthopedics.  Signed, Kirk Ruths MD

## 2013-01-13 NOTE — Progress Notes (Signed)
[facility] Daily Intern Progress Note  Subjective:  Ms Champoux tolerated surgery yesterday well and is currently in no pain. Also denies chest pain, shortness of breath, diarrhea, vomiting, or dysuria. Is tolerating PO this AM.  I have reviewed patient's medications. PRN tylenol early AM  Objective Temp:  [97.4 F (36.3 C)-98 F (36.7 C)] 97.9 F (36.6 C) (04/22 0400) Pulse Rate:  [59-106] 95 (04/22 0400) Resp:  [15-20] 18 (04/22 0400) BP: (157-197)/(72-97) 175/76 mmHg (04/22 0544) SpO2:  [96 %-98 %] 96 % (04/22 0400)   Intake/Output Summary (Last 24 hours) at 01/13/13 0826 Last data filed at 01/13/13 0743  Gross per 24 hour  Intake    670 ml  Output     25 ml  Net    645 ml     General: NAD, sitting at bedside, pleasant HEENT: EOMI, scler aclear CV: RRR with II/VI syst murmur heard best RUSB, no carotid bruits, [name] appreciated Pulm: Fine crackles bilateral bases up to mid-lungs, stable since admission Abd: Soft, nontender, nondistended, NABS Ext: Right leg wrapped in bandage but dripping with 2-3 drops of blood on floor at back of ankle, left leg with 1+LE edema but no tenderness Neuro: Awake, alert, no focal deficits, normal speech  Labs and Imaging  Recent Labs Lab 01/11/13 0625 01/12/13 0445 01/13/13 0430  WBC 10.3 12.5* 12.4*  HGB 7.8* 9.7* 9.9*  HCT 23.8* 28.9* 28.9*  PLT 300 323 314     Recent Labs Lab 01/10/13 0725 01/11/13 0625 01/13/13 0430  NA 139 139 142  K 4.3 4.1 4.2  CL 110 110 109  CO2 19 22 21   BUN 21 26* 20  CREATININE 2.06* 2.45* 2.05*  GLUCOSE 136* 126* 136*  CALCIUM 8.6 8.5 8.7      Recent Labs Lab 01/08/13 1221 01/09/13 0248 01/09/13 0929 01/09/13 1742 01/09/13 2200  TROPONINI <0.30 2.16* 1.66* 0.99* 0.86*   A1c 7   Recent Labs Lab 01/12/13 1159 01/12/13 1654 01/12/13 1831 01/12/13 2146 01/13/13 0743  GLUCAP 121* 96 114* 114* 134*     01/09/2013 02:48  Cholesterol 204 (H)  Triglycerides 147  HDL 40  LDL  (calc) 135 (H)  VLDL 29  Total CHOL/HDL Ratio 5.1    Repeat EKG this morning with no signs of active ischemia  2D echocardiogram 01/09/13: Study Conclusions - Left ventricle: The cavity size was normal. Wall thickness was increased in a pattern of mild LVH. Systolic function was normal. The estimated ejection fraction was in the range of 60% to 65%. Wall motion was normal; there were no regional wall motion abnormalities. Doppler parameters are consistent with abnormal left ventricular relaxation (grade 1 diastolic dysfunction). - Left atrium: The atrium was mildly dilated. Transthoracic echocardiography. M-mode, complete 2D, spectral Doppler, and color Doppler. Height: Height: 170.2cm. Height: 67in. Weight: Weight: 111.6kg. Weight: 245.5lb. Body mass index: BMI: 38.5kg/m^2. Body surface area: BSA: 2.93m^2. Blood pressure: 178/80. Patient status: Inpatient. Location: Bedside.  4/18 cardiac catheterization:  1. 1V CAD  2. NSTEMI   4/19 Carotid doppler - There is no obvious evidence of hemodynamically significant internal carotid artery stenosis >40%. The right vertebral artery is patent with antegrade flow. Unable to visualize the left vertebral artery.  Incidental finding:  There is a vascular structure visualized at the superior left thyroid lobe with low resistant flow.   Assessment and Plan - TALLYN FITTRO is a 57 y.o. year old female with a PMH of hypertension, diabetes, and severe diabetic neuropathy presenting with atrial  fibrillation with RVR and a fractured right ankle after a syncopal epidsode 4/17, now s/p right ankle repair.   # Ankle Fracture - Occurred 01/08/13 - Oblique fracture of distal fibula, s/p reduction and splinting and then 4/21 repair   - Took to OR once medically cleared by cardiology - Maintain elevated with ice, non weight bearing  - Tylenol and oxycodone for pain PRN  - POD 1 from open reduction internal fixation Right ankle fracture - Stable for  discharge per ortho. Touchdown weightbearing right with fracture boot in place.  - F/u and appreciate any further orthopedic surgery recommendations - F/u with cardiology if can stop heparin drip  # Atrial Fibrillation with RVR, NSTEMI - Noted A fib on first EKG in ED, given CHADS Score of 4, patient placed on heparin drip and diltiazem drip; Converted to sinus rhythm now on PO dilt and BB  - Cardiology consulted- Appreciate recs re: medical management vs PCI - diltiazen 120 PO daily, coreg increased to 12.5 BID, continue heparin gtt (continued through surgery), nitro gtt, aspirin, statin increased to atorvastatin 80  - Address anticoagulation needs after surgery, particularly in setting of bleeding wound - Telemetry  - troponins elevated up to 2 and now trending down, EKG with no active ischemia - Echo with EF 60-65 and grade 1 diastolic and mildly dilated LA - Cardiac catheterization 4/18 showed tight left circumflex lesions (tandem 80% lesions in proximal to mid AV groove) that are not critical. Amenable to PCI but would require drug-eluting stent for 61m-17yr  with DM Type II - will discuss after ankle surgery   - Ok for ankle surg if chest pain free and will get PCI later in the week - Likely needs anticoagulation long-term. Will discuss after decision re: ankle surgery  # Syncope - Unknown etiology, but likely atrial fibrillation with RVR vs other dysrhythmia vs orthostatic hypotension vs PE (Wells Score 1.5) vs vasovagal with nausea; CT head ruled out acute bleed. Echo with LVH, grade I diastolic dysfunction, mild atrial dilation with EF 60-65%  - Treating dysrhythmia as noted above  - Gave IVF to improve vol status  - No need for PE eval since low suspsicion and already on anticoagulation - Carotid doppler without stenosis - TTE on 4/18 with EF 60-65 % and grade 1 diatolic dysf  # Vascular Disease - Noted to have possible small vessel stroke on right lentiform nucleus which is likely old  and does not correlated with current condition  - Continue Lipitor, LDL 135 - increased lipitor to 80 per cards  # Hypertension - Was very elevated through yesterday, but today more stable at Q000111Q systolic. - Patient taking carvedilol daily, previously also on amlodipine and chlorthalidone, but ran out months ago  - increased coreg to 12.5mg  BID, added chlorthalidone, changed cardizem yesterday to cardizem CD and increased from 120 to 240 mg daily, and continue nitro drip per cards recs - If remains elevated, consider adding hydralazine prn  # DM, type 2 with Neurologic Deficits -  - CBGs 131-176 - A1C - 7.0 on 4/17 - Lantus at 20U (home dose 30) and moderate sliding scale   # Acute on chronic CKD Stage III  - likely diabetic nephropathy and worsened by IV contrast - Creat 1.8--> 2.06 -->2.4 (CrCl 44.7 calculated)-->2.05 today, improving s/p dye exposure - IV fluids to 125 ml/hr - Avoid nephrotoxic agents  - Discuss with patient possible future dialysis- pt tells cards she doesn't ever want it and they are  understandably concerned about PCI if she would refuse if needed  # Anemia, Normocytic  - Chronic - Hgb baseline 10; 9.9 on presentation-->7.8 4/20 and received transfusion in setting of NSTEMI, now 9.7 - transfusion threshold of 8.0 with NSTEMI - F/u daily CBC  - Ordered hemoccult to eval if slow GI bleed. If positive, may need to d/c heparin  # Leukocytosis - Day 2. Afebrile. - Likely inflammatory marker from recent surgery.  - Continue to monitor daily  FENGI - Resumed carb modified diet after surgery, NS IVF at 125 - decrease now that days out from dye exposure and tolerating po? PPX - On heparin drip  DISPO - home pending decision re: PCI; ?cardiac rehab?  Conni Slipper [phone] 01/13/2013, 8:26 AM

## 2013-01-13 NOTE — Progress Notes (Signed)
PT Cancellation Note  Patient Details Name: [name] MRN: [id number] DOB: 06-28-1956   Cancelled Treatment:    Reason Eval/Treat Not Completed: Other (comment) (Bleeding from dressing.  RN in room.)  Pt also waiting for Cam Boot.  Will try back later.     Catarina Hartshorn, [place] 01/13/2013, 9:21 AM

## 2013-01-13 NOTE — Progress Notes (Signed)
Patient ID: [name], female   DOB: 10-Jan-1956, 57 y.o.   MRN: [id number] Postoperative day 1 open reduction internal fixation right ankle fracture.  Patient is stable from orthopedic standpoint for discharge.  Touchdown weightbearing on the right with fracture boot in place.  Keep dressing clean dry and intact.

## 2013-01-13 NOTE — Progress Notes (Signed)
Orthopedic Tech Progress Note Patient Details:  [name] [birth year] [id number] CAM Walker applied to Right LE. Application explained. Tolerated well.  Ortho Devices Type of Ortho Device: CAM walker Ortho Device/Splint Location: Right Ortho Device/Splint Interventions: Application   Asia R Thompson 01/13/2013, 1:08 PM

## 2013-01-13 NOTE — Progress Notes (Signed)
I have seen and examined this patient. I have discussed with Dr Dianah Field.  I agree with their findings and plans as documented in their progress note.

## 2013-01-13 NOTE — Progress Notes (Signed)
ANTICOAGULATION CONSULT NOTE - Follow Up Consult  Pharmacy Consult for Heparin Indication: atrial fibrillation  Allergies  Allergen Reactions  . Lisinopril Other (See Comments)    coughing    Patient Measurements: Height: 5\' 7"  (170.2 cm) Weight: 246 lb 11.2 oz (111.902 kg) IBW/kg (Calculated) : 61.6 Heparin Dosing Weight: 87kg  Vital Signs: Temp: 97.4 F (36.3 C) (04/21 2000) Temp src: Oral (04/21 2000) BP: 173/78 mmHg (04/21 2357) Pulse Rate: 100 (04/21 2158)  Labs:   Recent Labs  01/10/13 0725  01/11/13 0625 01/12/13 0445 01/13/13 0430  HGB 8.4*  --  7.8* 9.7* 9.9*  HCT 25.3*  --  23.8* 28.9* 28.9*  PLT 312  --  300 323 314  HEPARINUNFRC 0.18*  < > 0.70 0.59 0.70  CREATININE 2.06*  --  2.45*  --   --   < > = values in this interval not displayed.  Estimated Creatinine Clearance: 32.7 ml/min (by C-G formula based on Cr of 2.45).   Medications:  Heparin @ 1550 units/hr  Assessment: 57 yo F started on heparin for new onset afib. Also had CP with elevated troponin so was taken to the cath lab and found to have 1v CAD amenable to PCI. Heparin was resumed after cath with plans for PCI after ORIF of ankle (planned for 4/21 PM) due to need for dual antiplatelet therapy after stent.  Patient now s/p ORIF. Heparin was continued during intervention.   Heparin level (0.7) is at the upper end of the goal range on 1550 units/hr. No bleeding per RN.   Goal of Therapy:  Heparin level 0.3-0.7 units/ml Monitor platelets by anticoagulation protocol: Yes   Plan:  1. Decrease IV heparin to 1450 units/hr.  2. Heparin level in 6 hours.   Raye Sorrow, PharmD  01/13/2013 5:30 AM

## 2013-01-13 NOTE — Progress Notes (Signed)
Pt bleeding from righ ankle surgical site. Called surgeon Dr Sharol Given and obtained order to change dressing this AM.. During dressing change, active bleeding was noted, stopped bleeding with compression. Staples are in place. New dressing applied  With AB sponge, keflix and coban. Surgeon Dr. recommended to talk with treating team to have heparin drip addressed otherwise the site probably will keep on bleeding. Will continue to monitor for bleeding and signs of anemia.

## 2013-01-14 DIAGNOSIS — I10 Essential (primary) hypertension: Secondary | ICD-10-CM

## 2013-01-14 DIAGNOSIS — R5381 Other malaise: Secondary | ICD-10-CM

## 2013-01-14 LAB — BASIC METABOLIC PANEL
BUN: 21 mg/dL (ref 6–23)
Calcium: 8.7 mg/dL (ref 8.4–10.5)
Chloride: 109 mEq/L (ref 96–112)
Creatinine, Ser: 2.28 mg/dL — ABNORMAL HIGH (ref 0.50–1.10)
GFR calc Af Amer: 26 mL/min — ABNORMAL LOW (ref >90)
GFR calc non Af Amer: 23 mL/min — ABNORMAL LOW (ref >90)

## 2013-01-14 LAB — CBC
HCT: 27.3 % — ABNORMAL LOW (ref 36.0–46.0)
MCHC: 33.3 g/dL (ref 30.0–36.0)
MCV: 78.9 fL (ref 78.0–100.0)
Platelets: 297 10*3/uL (ref 150–400)
RDW: 14.2 % (ref 11.5–15.5)
WBC: 12.1 10*3/uL — ABNORMAL HIGH (ref 4.0–10.5)

## 2013-01-14 LAB — GLUCOSE, CAPILLARY
Glucose-Capillary: 146 mg/dL — ABNORMAL HIGH (ref 70–99)
Glucose-Capillary: 136 mg/dL — ABNORMAL HIGH (ref 70–99)
Glucose-Capillary: 163 mg/dL — ABNORMAL HIGH (ref 70–99)

## 2013-01-14 MED ORDER — ACETAMINOPHEN 325 MG PO TABS: 650.0000 mg | ORAL_TABLET | Freq: Four times a day (QID) | ORAL | Status: DC | PRN

## 2013-01-14 MED ORDER — VITAMIN D (ERGOCALCIFEROL) 1.25 MG (50000 UNIT) PO CAPS
50000.0000 [IU] | ORAL_CAPSULE | ORAL | Status: DC
  Administered 2013-01-14: 50000 [IU] via ORAL
  Filled 2013-01-14: qty 1

## 2013-01-14 MED ORDER — TRAMADOL HCL 50 MG PO TABS
50.0000 mg | ORAL_TABLET | Freq: Four times a day (QID) | ORAL | Status: DC
  Filled 2013-01-14 (×2): qty 1

## 2013-01-14 MED ORDER — HYDRALAZINE HCL 25 MG PO TABS
25.0000 mg | ORAL_TABLET | Freq: Three times a day (TID) | ORAL | Status: DC
  Administered 2013-01-14 – 2013-01-15 (×3): 25 mg via ORAL
  Filled 2013-01-14 (×8): qty 1

## 2013-01-14 NOTE — Discharge Summary (Signed)
Discharge Summary 01/15/2013 9:49 PM  [name] DOB: September 19, 1956 MRN: [id number]  Date of Admission: 01/08/2013 Date of Discharge: 01/15/2013  PCP: Conni Slipper, MD Consultants: Orthopedic surgery, cardiology  Reason for Admission: Syncope with ankle fracture  Discharge Diagnosis Primary 1. Ankle fracture Secondary 1. Atrial fibrillation with RVR 2. NSTEMI 3. Syncope 4. Vascular disease 5. Hypertension 6. 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 Acute on chronic kidney disease Stage III 8. Anemia, normocytic 9. Leukocytosis   Hospital Course: [name] is a 57 y.o. year old female with a PMH of hypertension, diabetes, and severe diabetic neuropathy presenting with atrial fibrillation with RVR and a fractured right ankle after a syncopal epidsode 4/17, s/p right ankle surgical repair.   1. Ankle fracture 4/17 - Imaging showed oblique fracture of distal fibula. Reduced, splinted, and once medically cleared by cardiology, took patient to OR 4/21 for open reduction internal fixation of right ankle fracture. Ortho recommended touchdown weightbearing with fracture boot in place. Dosed tylenol and tramadol for pain (patiend did not tolerate oxycodone). With moderate bleeding through closed wound post-op day 1, stopped heparin. Found low vitamin D level so dosed 50,000 units 4/23 with instructions to take weekly x 8 weeks. Recheck level at 8 weeks. Per cardiology recs and once cleared with ortho, started apixaban for paroxysmal atrial fibrillation on 4/24. Attempted to arrange inpatient rehabilitation but affordability was an issue, so discharged patient with Home Health PT, RN, and SW in place. Plan follow up with ortho in 2 weeks.  2. Atrial fibrillation with RVR - Noted A fib with RVR on first EKG in ED, likely the cause of her pre-admission syncope. Given CHADS score of 4, patient was placed on heparin drip and diltiazem drip and converted to NSR. She was  transitioned to PO diltiazem and beta blocker. Cardiology was consulted and recommended diltiazem 120mg  PO daily, increasing carvedilol to 12.5mg  BID, and continuing heparin drip, nitro drip, aspirin, and statin (increased to 80mg  daily). Patient remained in NSR through rest of hospitalization. Diltiazem increased to Diltiazem CD 240mg  daily for elevated BPs. Per cardiology recommendations for paroxysmal atrial fibrillation, started apixaban 4/24 after clearing with orthopedic surgery, and CM helped arrange this for free x 1 month with plan to follow up with PCP for [facility] or cardiology office's free apixaban samples.   3. NSTEMI - Troponins elevated from WNL to 2 with no active ischemia on EKG. Likely cardiac strain from AFIB with RVR. Held ankle surgery until third troponin was trending down. Patient initially had some dull chest pain and nausea but this resolved. Cardiology team took patient for catheterization 4/18, which showed tight left circumflex lesions (tandem 80% lesions in proximal to mid AV groove) that are not critical. Amenable to PCI but would require drug-eluting stent for 56m-65yr with DM Type II, so held off on this until ankle surgery performed. Second catheterization for PCI would expose pt to contrast which would likely worsen renal function, and patient resisted hemodialysis, so cardiology recommended medical management with aspirin, high-intensity statin, beta-blocker, ca-channel blocker, and nitrates. Transfused 1 unit pRBC on 4/20 in the setting of NSTEMI with Hgb drop from 9.9 to 7.8. Troponins trended down to 0.86. Pt to f/u with cardiology in 2 weeks and instructed that she may need PCI if has another episode of chest pain.  4. Syncope - Likely due to atrial fibrillation with RVR vs vasovagal with nausea. CT head ruled out acute bleed. TTE 4/18 with LVH, grade  I diastolic dysfunction, mild atrial dilation, and EF 60-65%. Treated arrhythmia as noted above, and gave IVF to improve  volume status. Low suspicion for PE (Wells score 1.5) so no workup. Carotid doppler showed no stenosis.  5. Vascular disease - Patient is noted to have possible small vessel stroke on right lentiform nucleus which is likely old and does not correlate with current condition. Increased home lipitor to 80mg  per cards recs with LDL 135.   6. Hypertension - Home medications include coreg, chlorthalidone, and amlodipine, but patient had run out months prior to admission. On admission, started cardizem drip for atrial fibrillation with RVR and when back in NSR, was transitioned to cardizem 120mg  PO and carvedilol continued. With BP 200s/100s, increased coreg to 12.5mg  BID, added chlorthalidone, changed cardizem to 24 hour formulation and increased from 120 to 240mg  daily. BP improved to SBP 150s. Per cards recs, continued nitro drip and added hydralazine TID. Considered ACE-i/ARB but did not initiate due to elevated Cr with dye exposure - can consider in future once renal function stabilizes (though lisinopril allergy listed). Discharged on cardizem CD 240 mg daily, coreg 12.5mg  BID, chlorthalidone 25mg  daily, hydralazine 25mg  TID, imdur 24 hr 30mg  daily. Follow up at [facility] tomorrow 4/25.  7. 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 Severe diabetic neuropathy with inability to feel ankle fracture very well. Restarted lantus 20 units (home dose 20) and moderate SSI. Follow up diabetes control as outpatient.  8. Acute on chronic kidney disease Stage III - Likely diabetic nephropathy and worsened on admission by IV contrast from 1.8 to 2.06 to 2.4. Improved by 4/22 to 2.05 then back to 2.28. Continued IV hydration and discussed future dialysis with patient, who stated that she does not ever want HD, limiting cardiology team's ability to offer PCI. Follow up Cr as outpatient and when stable, add ACE-I (allergic to lisinopril) or ARB.  9. Anemia, normocytic  - chronic, baseline Hgb 10. Hgb was at baseline on presentation but dropped to 7.8 on 4/20. Pt received transfusion in setting of NSTEMI (transfusion threshold 8) and plan for surgery, with improvement to 9.8 and stable on follow-up. Hemoccult ordered to evaluate for slow GI bleed but was not done. Further evaluate this as outpatient.  10. Leukocytosis - Mildly elevated day 2 and 3 of hospitalization to ~12.5, though afebrile with no other signs of infection. Thought inflammatory marker from recent surgery and monitored with no acute events.   Discharge day physical exam: Filed Vitals:   01/15/13 0447  BP: 166/94  Pulse: 62  Temp: 98.6 F (37 C)  Resp:   General: NAD, sitting at bedside, pleasant  HEENT: EOMI, sclera clear  CV: RRR with very soft I/VI syst murmur heard best RUSB, no carotid bruits, [name] appreciated  Pulm: CTAB with fine LLL crackles  Abd: Soft, nontender, nondistended, NABS  Ext: Right leg wrapped in bandage. C/D/I. Bilateral trace LE edema but no tenderness to ankle  Neuro: Awake, alert, no focal deficits, normal speech   Procedures: Ankle reduction/splinting, 4/18 cardiac catheterization, 4/21 open reduction of ankle fracture  Discharge Medications   Medication List    STOP taking these medications       amLODipine 10 MG tablet  Commonly known as:  NORVASC      TAKE these medications       apixaban 5 MG Tabs tablet  Commonly known as:  ELIQUIS  Take 1 tablet (5 mg total)  by mouth 2 (two) times daily. Fill after 02/13/13     apixaban 5 MG Tabs tablet  Commonly known as:  ELIQUIS  Take 1 tablet (5 mg total) by mouth 2 (two) times daily. - Single Rx for 1 month free per CM      aspirin EC 81 MG tablet  Take 81 mg by mouth daily.     atorvastatin 80 MG tablet  Commonly known as:  LIPITOR  Take 1 tablet (80 mg total) by mouth at bedtime.     carvedilol 12.5 MG tablet  Commonly known as:  COREG  Take 1 tablet (12.5 mg total) by mouth 2 (two) times  daily with a meal.     chlorthalidone 25 MG tablet  Commonly known as:  HYGROTON  Take 1 tablet (25 mg total) by mouth daily.     diltiazem 240 MG 24 hr capsule  Commonly known as:  CARDIZEM CD  Take 1 capsule (240 mg total) by mouth daily.     hydrALAZINE 25 MG tablet  Commonly known as:  APRESOLINE  Take 1 tablet (25 mg total) by mouth every 8 (eight) hours.     insulin glargine 100 UNIT/ML injection  Commonly known as:  LANTUS  Inject 0.15 mLs (15 Units total) into the skin at bedtime.     insulin regular 100 units/mL injection  Commonly known as:  NOVOLIN R,HUMULIN R  Inject 10-20 Units into the skin 3 (three) times daily before meals. Sliding scale of insulin dosage       isosorbide mononitrate 30 MG 24 hr tablet  Commonly known as:  IMDUR  Take 1 tablet (30 mg total) by mouth daily.     multivitamin with minerals Tabs  Take 1 tablet by mouth daily.     nitroGLYCERIN 0.4 MG SL tablet  Commonly known as:  NITROSTAT  Place 1 tablet (0.4 mg total) under the tongue every 5 (five) minutes as needed for chest pain.     ondansetron 4 MG tablet  Commonly known as:  ZOFRAN  Take 1 tablet (4 mg total) by mouth every 6 (six) hours as needed for nausea.     traMADol 50 MG tablet  Commonly known as:  ULTRAM  Take 1 tablet (50 mg total) by mouth every 6 (six) hours.     Vitamin D (Ergocalciferol) 50000 UNITS Caps  Commonly known as:  DRISDOL  Take 1 capsule (50,000 Units total) by mouth every Wednesday.        Pertinent Hospital Labs / Imaging:    Recent Labs  Lab  01/12/13 0445  01/13/13 0430  01/14/13 0537   WBC  12.5*  12.4*  12.1*   HGB  9.7*  9.9*  9.1*   HCT  28.9*  28.9*  27.3*   PLT  323  314  297     Recent Labs  Lab  01/11/13 0625  01/13/13 0430  01/14/13 0537   NA  139  142  140   K  4.1  4.2  4.0   CL  110  109  109   CO2  22  21  20    BUN  26*  20  21   CREATININE  2.45*  2.05*  2.28*   GLUCOSE  126*  136*  115*   CALCIUM  8.5  8.7  8.7      Recent Labs  Lab  01/08/13 1221  01/09/13 0248  01/09/13 0929  01/09/13 1742  01/09/13 2200  TROPONINI  <0.30  2.16*  1.66*  0.99*  0.86*    A1c 7   Recent Labs  Lab  01/13/13 2046  01/14/13 0726  01/14/13 1126  01/14/13 1732  01/14/13 2043   GLUCAP  147*  106*  146*  136*  163*     01/09/2013 02:48   Cholesterol  204 (H)   Triglycerides  147   HDL  40   LDL (calc)  135 (H)   VLDL  29   Total CHOL/HDL Ratio  5.1   Repeat EKG with no signs of active ischemia   2D echocardiogram 01/09/13: Study Conclusions - Left ventricle: The cavity size was normal. Wall thickness was increased in a pattern of mild LVH. Systolic function was normal. The estimated ejection fraction was in the range of 60% to 65%. Wall motion was normal; there were no regional wall motion abnormalities. Doppler parameters are consistent with abnormal left ventricular relaxation (grade 1 diastolic dysfunction). - Left atrium: The atrium was mildly dilated. Transthoracic echocardiography. M-mode, complete 2D, spectral Doppler, and color Doppler. Height: Height: 170.2cm. Height: 67in. Weight: Weight: 111.6kg. Weight: 245.5lb. Body mass index: BMI: 38.5kg/m^2. Body surface area: BSA: 2.18m^2. Blood pressure: 178/80. Patient status: Inpatient. Location: Bedside.  4/18 cardiac catheterization:  1. 1V CAD  2. NSTEMI   4/19 Carotid doppler - There is no obvious evidence of hemodynamically significant internal carotid artery stenosis >40%. The right vertebral artery is patent with antegrade flow. Unable to visualize the left vertebral artery.  Incidental finding:  There is a vascular structure visualized at the superior left thyroid lobe with low resistant flow.   Discharge instructions:  **You were admitted with an ankle fracture after passing out, possibly due to atrial fibrillation with a very fast heart rate. You were seen by a cardiologist who recommended heart catheterization, where they found  some obstructions that were not totally closing off your vessels. They recommended percutaneous intervention or medical management, and you decided on medical management, which includes taking aspirin, a cholesterol medication (statin), and blood pressure medications (including carvedilol).   **The orthopedic doctors repaired your ankle. They are recommending touchdown weightbearing right lower extremity.  --- Follow up with orthopedic surgeon Dr. Sharol Given in 2 weeks  --- Follow up with the cardiologist Dr. Stanford Breed in 2 weeks.  ---You have a follow up appointment TOMORROW 4/25 with Dr. Otis Dials at 2:15 PM at [facility]. This will be very important because we need to check your blood levels and make sure you are doing okay.   ** We have also arranged [place] with [facility]. [phone]. Occupational hygienist, Physical Therapy, Education officer, museum).   ** Take all medications as follows:  START taking:  - Apixaban twice daily  - diltiazem once daily  - hydralazine three times daily  - imdur once daily  - tramadol for pain every 6 hours as needed  - nitroglycerin for chest pain when you need it (in which case you should seek immediate medical attention)  - vitamin D once weekly for 8 weeks.   CHANGE how you take these by now taking:  - Lipitor 80mg  daily  - carvedilol 12.5mg  twice daily  - lantus 15 units at bedtime  CONTINUE taking:  - aspirin  - chlorthalidone  - regular insulin via sliding scale with meals.   STOP taking amlodipine.   ** If you develop chest pain, shortness of breath, nausea, or other concerning symptoms, please seek immediate medical attention. Returning chest pain  is a sign that you may need a cardiac catheterization again.   Condition at discharge: stable  Disposition: Home with Home Health PT, RN, and SW  Pending Tests: hemoccult  Follow up: Follow-up Information   Follow up with DUDA,MARCUS V, MD In 2 weeks. (Call their office if you do  not hear about an appointment)    Contact information:   [address] [place] [address] [phone]       Schedule an appointment as soon as possible for a visit with Kirk Ruths, MD. (in ~2 weeks; can call their office to see if you can get apixaban samples)    Contact information:   [address], [address]                         [place] [address] [phone]       Follow up with Liam Graham, MD On 01/16/2013. (at 2:15 PM for hospital follow up)    Contact information:   [address] [place] [address] [phone]       Follow up Issues:  - Check hgb and creatinine at f/u (transfuse if <8 in setting of NSTEMI) - F/u BP on new regimen - F/u affordability of medications including apixaban (Consider [facility] if doesn't qualify for Medicaid) - Add ACE-i (allergic to lisinopril) or ARB when Cr stable - FOBT to evaluate anemia - Ankle healing - F/u with cardiology and ortho surgery 2 weeks - Vitamin D level at 8 weeks     Conni Slipper 01/15/2013 9:49 PM PGY-1 Family Medicine Teaching Service Service pager [phone]

## 2013-01-14 NOTE — Progress Notes (Signed)
CSW met with the pt at chairside to give her a list of SNF's for the area for post-acute rehab. CIR is assessing to see if this pt meets criteria for CIR. CSW will continue to follow possible post-acute needs.  Danise Edge, [place] Social Worker [phone]

## 2013-01-14 NOTE — Progress Notes (Signed)
Clinical Social Work Department BRIEF PSYCHOSOCIAL ASSESSMENT 01/14/2013  Patient:  [name], [name]     Account Number:  [id number]     Admit date:  01/08/2013  Clinical Social Worker:  Awilda Metro  Date/Time:  01/14/2013 05:11 PM  Referred by:  RN  Date Referred:  01/14/2013 Referred for  SNF Placement   Other Referral:   Interview type:  Patient Other interview type:    PSYCHOSOCIAL DATA Living Status:  FAMILY Admitted from facility:   Level of care:   Primary support name:   Primary support relationship to patient:   Degree of support available:   Great support.    CURRENT CONCERNS Current Concerns  Post-Acute Placement   Other Concerns:    SOCIAL WORK ASSESSMENT / PLAN SNF Placement was the referral for this pt.  CSW met with the pt at bedside today to offer SNF placement for continued rehab.   Assessment/plan status:  Other - See comment Other assessment/ plan:   Financial Counseling from [place]   Information/referral to community resources:    PATIENT'S/FAMILY'S RESPONSE TO PLAN OF CARE: Pt appears to be alert, oriented and is looking forward to going home. CSW offered to look for a SNF in [facility]. The SNF would be self pay due to the pt not having health insurance. The pt mentioned that due to having several thousand in a 401K that she doesn't qualify for Medicaid assistance. She stated that she will have to get to the bank to liquidate this asset to pay for existing medical bills, then she will do the Medicaid application. RNCM is working on setting up [facility] needs with [facility]. Pt stated that she has three grown sons that help her and her son [name] lives with the pt and is her caregiver. CSW spoke with the son [name] with the permission of the pt while in the pt's room to let him know that the pt will be d/c home tomorrow.  Son stated that he and his brothers will get his mother up the stairs when she goes home.  CSW filled out and placed an  ambulance form on the [place] just in case the pt changes her mind for tomorrows ride home.    Danise Edge, [place] Social Worker [phone]

## 2013-01-14 NOTE — Progress Notes (Addendum)
Clinical Social Work Department CLINICAL SOCIAL WORK PLACEMENT NOTE 01/14/2013  Patient:  [name], [name]  Account Number:  [id number] Admit date:  01/08/2013  Clinical Social Worker:  Awilda Metro  Date/time:  01/14/2013 05:27 PM Pt would have to pay for the SNF as self-pay and doesn't want to go to a SNF. Pt is refusing to go to SNF. She wants to go home and have her family take care of her. CSW spoke to the son Ebony Hail and he said that he and his brothers do take care of their mother.   Danise Edge, [place] Social Worker [phone]

## 2013-01-14 NOTE — Progress Notes (Signed)
Patient Name: [name] Date of Encounter: 01/14/2013   Active Problems:   Paroxysmal atrial fibrillation, rapid ventricular response   Hypertension   Diabetes mellitus   Chronic kidney disease, stage III (moderate)   Syncope   History of stroke   Diabetic neuropathy   NSTEMI (non-ST elevated myocardial infarction)   Closed right ankle fracture   CAD (coronary artery disease)   SUBJECTIVE  No chest pain or sob.    CURRENT MEDS . apixaban  5 mg Oral BID  . aspirin  81 mg Oral Daily  . atorvastatin  80 mg Oral QHS  . carvedilol  12.5 mg Oral BID WC  . chlorthalidone  25 mg Oral Daily  . diltiazem  240 mg Oral Daily  . hydrALAZINE  10 mg Oral Q6H  . insulin aspart  0-15 Units Subcutaneous TID WC  . insulin glargine  15 Units Subcutaneous QHS  . isosorbide mononitrate  30 mg Oral Daily  . sodium chloride  3 mL Intravenous Q12H    OBJECTIVE  Filed Vitals:   01/13/13 1749 01/13/13 2042 01/14/13 0023 01/14/13 0550  BP: 173/63 171/70 170/72 167/70  Pulse: 87 70  71  Temp:  98.9 F (37.2 C)  98.9 F (37.2 C)  TempSrc:  Oral  Oral  Resp:      Height:      Weight:      SpO2:  94%  93%    Intake/Output Summary (Last 24 hours) at 01/14/13 0657 Last data filed at 01/13/13 1700  Gross per 24 hour  Intake    600 ml  Output   1400 ml  Net   -800 ml   Filed Weights   01/08/13 1131 01/08/13 2035  Weight: 240 lb (108.863 kg) 246 lb 11.2 oz (111.902 kg)    PHYSICAL EXAM  General: Pleasant, NAD. Neuro: Alert and oriented X 3. Moves all extremities spontaneously. HEENT:  Normal  Neck: Supple Lungs:  Resp regular and unlabored, CTA. Heart: RRR  Abdomen: Soft, non-tender, non-distended, BS + x 4.  Extremities: No edema. S/P surgery R ankle  Accessory Clinical Findings  CBC  Recent Labs  01/13/13 0430 01/14/13 0537  WBC 12.4* 12.1*  HGB 9.9* 9.1*  HCT 28.9* 27.3*  MCV 78.5 78.9  PLT 314 [id number]   Basic Metabolic Panel  Recent Labs  01/13/13 0430  01/14/13 0537  NA 142 140  K 4.2 4.0  CL 109 109  CO2 21 20  GLUCOSE 136* 115*  BUN 20 21  CREATININE 2.05* 2.28*  CALCIUM 8.7 8.7   TELE sinus  ASSESSMENT AND PLAN  1.  R ankle Fx:  Management per orthopedics.  2.  NSTEMI/CAD:  No further chest pain.  Reviewed films previously; plan medical therapy for now; proceed with PCI if CP despite meds. Continue ASA 81 mg daily, coreg, cardizem, nitrates and statin.  3.  HTN:  Would add hydralazine 25 mg po tid; can consider ACEI later once it is clear renal function stable.  4.  HL:  LDL 135.  Cont lipitor 80.  5.  Acute on chronic stage III kidney disease:  Follow renal function  6.  Acute on chronic normocytic anemia: Hgb mildly decreased today  7.  DM:  Cont insulin/ssi.  8. PAF - continue cardizem and beta blocker;  Some bleeding from surgical site yesterday; will hold apixiban today; begin apixaban 5 mg po BID in AM if no bleeding and ok with orthopedics. Patient can be transferred to  rehab from a cardiac standpoint. Signed, Kirk Ruths MD

## 2013-01-14 NOTE — Progress Notes (Signed)
Rehab Admissions Coordinator Note:  Patient was screened by Cleatrice Burke for appropriateness for an Inpatient Acute Rehab Consult. Rehab consult is pending today.  Cleatrice Burke 01/14/2013, 8:18 AM  I can be reached at [phone].

## 2013-01-14 NOTE — Progress Notes (Addendum)
Referral received today for SNF Placement. CSW met with pt to offer SNF Placement in [facility]. Pt would have to self-pay for the SNF. Pt stated that she has money in a 401K that needs to be liquidated before she can apply for Medicaid. Pt stated that she has 3 grown sons and one that lives with her full time and is her care giver in her apt. Pt told CSW and RNCM that she wants to go home. Pt stated that she also has a sister that might be able to take care of her. CSW called Financial Aid to meet with the pt tomorrow morning who will give her a medicaid application. Pt will be d/c tomorrow with HH-PT, RN & Education officer, museum. Pt is asking that an ambulance take her home at d/c.   Danise Edge, [place] Social Worker [phone]

## 2013-01-14 NOTE — Progress Notes (Signed)
Physical Therapy Evaluation  Past Medical History  Diagnosis Date  . Hypertension   . Diabetes mellitus   . Chronic kidney disease, stage III (moderate)    Past Surgical History  Procedure Laterality Date  . Laser surgery for diabetic retinopathy    . Abdominal hysterectomy    . Orif ankle fracture Right 01/12/2013    Procedure: OPEN REDUCTION INTERNAL FIXATION (ORIF) ANKLE FRACTURE;  Surgeon: Newt Minion, MD;  Location: [facility];  Service: Orthopedics;  Laterality: Right;    01/13/13 1500  PT Visit Information  Last PT Received On 01/13/13  Assistance Needed +1  PT Time Calculation  PT Start Time 1410  PT Stop Time 1434  PT Time Calculation (min) 24 min  Subjective Data  Subjective This is going to be harder than I thought.    Patient Stated Goal Back to normal.    Precautions  Precautions Fall  Required Braces or Orthoses Other Brace/Splint  Other Brace/Splint Cam Walker boot to R LE  Restrictions  Weight Bearing Restrictions Yes  RLE Weight Bearing TWB  Home Living  Lives With Son  Available Help at Discharge Family;Available 24 hours/day  Type of Home Apartment (2nd floor apartment)  Home Access Stairs to enter  Entrance Stairs-Number of Steps flight  Entrance Stairs-Rails Left;Right  Home Layout One level  Home Adaptive Equipment None  Prior Function  Level of Independence Needs assistance  Needs Assistance Meal Prep;Light Housekeeping  Meal Prep Moderate  Light Housekeeping Moderate  Able to Take Stairs? Yes  Driving No  Vocation Unemployed (Pending Disability 2/2 visual deficits)  Communication  Communication No difficulties  Cognition  Arousal/Alertness Awake/alert  Behavior During Therapy WFL for tasks assessed/performed  Overall Cognitive Status Within Functional Limits for tasks assessed  Right Lower Extremity Assessment  RLE ROM/Strength/Tone Deficits  RLE ROM/Strength/Tone Deficits Hip/knee WFL, ankle/foot in cam boot.    Left Lower Extremity  Assessment  LLE ROM/Strength/Tone WFL for tasks assessed  LLE Sensation WFL - Light Touch  Trunk Assessment  Trunk Assessment Normal  Bed Mobility  Bed Mobility Not assessed  Transfers  Transfers Sit to Stand;Stand to Sit  Sit to Stand 4: Min assist;With upper extremity assist;From bed  Stand to Sit 4: Min assist;With upper extremity assist;To bed  Details for Transfer Assistance cues for UE use, getting closer prior to sitting, controlling descent to bed.    Ambulation/Gait  Ambulation/Gait Assistance 4: Min assist  Ambulation Distance (Feet) 35 Feet  Assistive device Rolling walker  Ambulation/Gait Assistance Details cues for TDWB, upright posture, positioning within RW.  pt fatigues quickly and drags R foot behind her at times 2/2 weight of cam boot.    Gait Pattern Step-to pattern;Trunk flexed  Stairs No  Wheelchair Mobility  Wheelchair Mobility No  Balance  Balance Assessed No  PT - End of Session  Equipment Utilized During Treatment Gait belt  Activity Tolerance Patient limited by fatigue  Patient left in bed;with call bell/phone within reach (Sitting EOB)  Nurse Communication Mobility status  PT Assessment  Clinical Impression Statement pt presents with R Ankle fx and NSTEMI.  pt very motivated, but lives in 2nd floor apartment.  concern about if pt D/C straight home her ability to enter/exit her apartment.  pt would benefit from CIR at D/C to maximize I prior to returning home.    PT Recommendation/Assessment Patient needs continued PT services  PT Problem List Decreased strength;Decreased activity tolerance;Decreased balance;Decreased mobility;Decreased knowledge of use of DME;Decreased knowledge of precautions;Obesity;Pain  Barriers to Discharge Inaccessible home environment  PT Therapy Diagnosis  Difficulty walking;Acute pain  PT Plan  PT Frequency Min 5X/week  PT Treatment/Interventions DME instruction;Gait training;Stair training;Functional mobility  training;Therapeutic activities;Therapeutic exercise;Balance training;Patient/family education  PT Recommendation  Recommendations for Other Services OT consult;Rehab consult  Follow Up Recommendations CIR  PT equipment Rolling walker with 5" wheels  Individuals Consulted  Consulted and Agree with Results and Recommendations Patient  Acute Rehab PT Goals  PT Goal Formulation With patient  Time For Goal Achievement 01/28/13  Potential to Achieve Goals Good  Pt will go Supine/Side to Sit with modified independence  PT Goal: Supine/Side to Sit - Progress Goal set today  Pt will go Sit to Supine/Side with modified independence  PT Goal: Sit to Supine/Side - Progress Goal set today  Pt will go Sit to Stand with modified independence  PT Goal: Sit to Stand - Progress Goal set today  Pt will go Stand to Sit with modified independence  PT Goal: Stand to Sit - Progress Goal set today  Pt will Ambulate 51 - 150 feet;with modified independence;with rolling walker  PT Goal: Ambulate - Progress Goal set today  Pt will Go Up / Down Stairs Flight;with min assist;with least restrictive assistive device  PT Goal: Up/Down Stairs - Progress Goal set today  PT General Charges  $$ ACUTE PT VISIT 1 Procedure  PT Evaluation  $Initial PT Evaluation Tier I 1 Procedure  PT Treatments  $Gait Training 8-22 mins  $Therapeutic Activity 8-22 mins   [place], PT [phone]

## 2013-01-14 NOTE — Progress Notes (Signed)
Patient ID: SHEWANDA CERF, female   DOB: 11-28-1955, 57 y.o.   MRN: [id number] FMTS Daily Intern Progress Note  Subjective:  Ms Ruisi tolerated surgery yesterday well and is currently in no pain. Also denies chest pain, shortness of breath, diarrhea, vomiting, or dysuria. Is tolerating PO this AM. Desiring to be discharged to CIR. She reports no pain, however she states she has no feeling her foot.   I have reviewed patient's medications. PRN tylenol early AM  Objective Temp:  [98.2 F (36.8 C)-98.9 F (37.2 C)] 98.9 F (37.2 C) (04/23 0550) Pulse Rate:  [68-87] 71 (04/23 0550) Resp:  [18] 18 (04/22 1334) BP: (167-188)/(63-82) 167/70 mmHg (04/23 0550) SpO2:  [93 %-95 %] 93 % (04/23 0550)   Intake/Output Summary (Last 24 hours) at 01/14/13 0936 Last data filed at 01/13/13 1700  Gross per 24 hour  Intake    480 ml  Output   1400 ml  Net   -920 ml    General: NAD, sitting at bedside, pleasant HEENT: EOMI, scler aclear CV: RRR with II/VI syst murmur heard best RUSB, no carotid bruits, [name] appreciated Pulm: Diminished lung sounds left lower base. Fine crackles right lower base. Abd: Soft, nontender, nondistended, NABS Ext: Right leg wrapped in bandage. C/D/I. left leg with 1+LE edema but no tenderness Neuro: Awake, alert, no focal deficits, normal speech  Labs and Imaging  Recent Labs Lab 01/12/13 0445 01/13/13 0430 01/14/13 0537  WBC 12.5* 12.4* 12.1*  HGB 9.7* 9.9* 9.1*  HCT 28.9* 28.9* 27.3*  PLT 323 314 297     Recent Labs Lab 01/11/13 0625 01/13/13 0430 01/14/13 0537  NA 139 142 140  K 4.1 4.2 4.0  CL 110 109 109  CO2 22 21 20   BUN 26* 20 21  CREATININE 2.45* 2.05* 2.28*  GLUCOSE 126* 136* 115*  CALCIUM 8.5 8.7 8.7      Recent Labs Lab 01/08/13 1221 01/09/13 0248 01/09/13 0929 01/09/13 1742 01/09/13 2200  TROPONINI <0.30 2.16* 1.66* 0.99* 0.86*   A1c 7   Recent Labs Lab 01/13/13 0743 01/13/13 1147 01/13/13 1644 01/13/13 2046  01/14/13 0726  GLUCAP 134* 164* 156* 147* 106*     01/09/2013 02:48  Cholesterol 204 (H)  Triglycerides 147  HDL 40  LDL (calc) 135 (H)  VLDL 29  Total CHOL/HDL Ratio 5.1    Repeat EKG this morning with no signs of active ischemia  2D echocardiogram 01/09/13: Study Conclusions - Left ventricle: The cavity size was normal. Wall thickness was increased in a pattern of mild LVH. Systolic function was normal. The estimated ejection fraction was in the range of 60% to 65%. Wall motion was normal; there were no regional wall motion abnormalities. Doppler parameters are consistent with abnormal left ventricular relaxation (grade 1 diastolic dysfunction). - Left atrium: The atrium was mildly dilated. Transthoracic echocardiography. M-mode, complete 2D, spectral Doppler, and color Doppler. Height: Height: 170.2cm. Height: 67in. Weight: Weight: 111.6kg. Weight: 245.5lb. Body mass index: BMI: 38.5kg/m^2. Body surface area: BSA: 2.39m^2. Blood pressure: 178/80. Patient status: Inpatient. Location: Bedside.  4/18 cardiac catheterization:  1. 1V CAD  2. NSTEMI   4/19 Carotid doppler - There is no obvious evidence of hemodynamically significant internal carotid artery stenosis >40%. The right vertebral artery is patent with antegrade flow. Unable to visualize the left vertebral artery.  Incidental finding:  There is a vascular structure visualized at the superior left thyroid lobe with low resistant flow.   Assessment and Plan - Massie Kluver  Kuznik is a 57 y.o. year old female with a PMH of hypertension, diabetes, and severe diabetic neuropathy presenting with atrial fibrillation with RVR and a fractured right ankle after a syncopal epidsode 4/17, now s/p right ankle repair.   Ankle Fracture - Occurred 01/08/13 - Oblique fracture of distal fibula, s/p reduction and splinting and then 4/21 repair   - Took to OR once medically cleared by cardiology - Maintain elevated with ice, non weight  bearing  - Tylenol and oxycodone for pain PRN --. patient does not tolerate this medication,orderd tramadol - POD 2 from open reduction internal fixation Right ankle fracture - Stable for discharge per ortho. Touchdown weightbearing right with fracture boot in place.  - F/u and appreciate any further orthopedic surgery recommendations   Atrial Fibrillation with RVR, NSTEMI - Noted A fib on first EKG in ED, given CHADS Score of 4, patient placed on heparin drip and diltiazem drip; Converted to sinus rhythm now on PO dilt and BB  - Cardiology consulted- Appreciate recs re: medical management vs PCI - diltiazen 120 PO daily, coreg increased to 12.5 BID, aspirin, statin increased to atorvastatin 80  - Address anticoagulation needs after surgery, particularly in setting of bleeding wound - Telemetry  - troponins elevated up to 2 and now trending down, EKG with no active ischemia - Echo with EF 60-65 and grade 1 diastolic and mildly dilated LA - Cardiac catheterization 4/18 showed tight left circumflex lesions (tandem 80% lesions in proximal to mid AV groove) that are not critical. Amenable to PCI but would require drug-eluting stent for 54m-34yr  with DM Type II - will discuss after ankle surgery   - Ok for ankle surg if chest pain free and will get PCI later in the week - Likely needs anticoagulation long-term. Will discuss after decision re: ankle surgery; hold apixiban today; begin apixaban 5 mg po BID in AM if no bleeding and ok with orthopedics.  - Patient can be transferred to rehab from a cardiac standpoint.  - NSTEMI/CAD: No further chest pain. Reviewed films previously; plan medical therapy for now; proceed with PCI if CP despite meds. Continue ASA 81 mg daily, coreg, cardizem, nitrates and statin.  - HTN: hydralazine 25 mg po tid; can consider ACEI later once it is clear renal function stable.   Syncope - Unknown etiology, but likely atrial fibrillation with RVR vs other dysrhythmia vs  orthostatic hypotension vs PE (Wells Score 1.5) vs vasovagal with nausea; CT head ruled out acute bleed. Echo with LVH, grade I diastolic dysfunction, mild atrial dilation with EF 60-65%  - Treating dysrhythmia as noted above  - Gave IVF to improve vol status  - No need for PE eval since low suspsicion and already on anticoagulation - Carotid doppler without stenosis - TTE on 4/18 with EF 60-65 % and grade 1 diatolic dysf  Vascular Disease - Noted to have possible small vessel stroke on right lentiform nucleus which is likely old and does not correlated with current condition  - Continue Lipitor, LDL 135 - increased lipitor to 80 per cards  Hypertension - Was very elevated through yesterday, but today more stable at [id number] systolic. - Patient taking carvedilol daily, previously also on amlodipine and chlorthalidone, but ran out months ago  - increased coreg to 12.5mg  BID, added chlorthalidone, changed cardizem yesterday to cardizem CD and increased from 120 to 240 mg daily, and continue nitro drip per cards recs - If remains elevated, consider adding hydralazine prn  DM,  type 2 with Neurologic Deficits -  - CBGs 131-176 - A1C - 7.0 on 4/17 - Lantus at 20U (home dose 30) and moderate sliding scale   Acute on chronic CKD Stage III  - likely diabetic nephropathy and worsened by IV contrast - Creat 1.8--> 2.06 -->2.4 (CrCl 44.7 calculated)-->2.05 today, improving s/p dye exposure - IV fluids to 125 ml/hr - Avoid nephrotoxic agents  - Discuss with patient possible future dialysis- pt tells cards she doesn't ever want it and they are understandably concerned about PCI if she would refuse if needed  Anemia, Normocytic  - Chronic - Hgb baseline 10; 9.9 on presentation-->7.8 4/20 and received transfusion in setting of NSTEMI, now 9.7 - transfusion threshold of 8.0 with NSTEMI - F/u daily CBC  - Ordered hemoccult to eval if slow GI bleed. If positive, may need to d/c heparin   Leukocytosis  - Day 2. Afebrile. - Likely inflammatory marker from recent surgery.  - Continue to monitor daily  FENGI - Resumed carb modified diet after surgery, NS IVF at 125 - decrease now that days out from dye exposure and tolerating po? PPX - On heparin drip  DISPO - home pending decision re: CIR acceptance   Howard Pouch Pager: [phone] 01/14/2013, 9:36 AM

## 2013-01-14 NOTE — Progress Notes (Signed)
Pt's right ankle incision site bleeding with moderate drainage through dressing. Orthopedic Dr. On call notified and made aware. Vital signs stable, no complaints of pain from patient. Dressing reinforced with two abdominal pads, Kerlix, and Coflex bandage wrap to minimize bleeding and drainage. Will continue to monitor patient.

## 2013-01-14 NOTE — Progress Notes (Signed)
[facility]  Patient Status: New  [facility] is providing the following services: RN, PT and MSW  If patient discharges after hours, please call [phone].   [name] 01/14/2013, 4:21 PM

## 2013-01-14 NOTE — Progress Notes (Signed)
Physical Therapy Treatment Patient Details Name: [name] MRN: [id number] DOB: Mar 28, 1956 Today's Date: 01/14/2013 Time: [id number] PT Time Calculation (min): 23 min  PT Assessment / Plan / Recommendation Comments on Treatment Session  Pt reports being very sore today due to yesterday's session but agreeable to participate & motivated to progress.  Fatigues very quickly.  Cont to recommend CIR at d/c to maximize functional recovery prior to home.      Follow Up Recommendations  CIR     Does the patient have the potential to tolerate intense rehabilitation     Barriers to Discharge        Equipment Recommendations  Rolling walker with 5" wheels    Recommendations for Other Services OT consult;Rehab consult  Frequency Min 5X/week   Plan Discharge plan remains appropriate    Precautions / Restrictions Precautions Precautions: Fall Required Braces or Orthoses: Other Brace/Splint Other Brace/Splint: Cam Walker boot to R LE Restrictions Weight Bearing Restrictions: Yes RLE Weight Bearing: Touchdown weight bearing   Pertinent Vitals/Pain Reports soreness in UE's & across chest area due to therapy yesterday.  Reports she has had tylenol.      Mobility  Bed Mobility Bed Mobility: Not assessed Details for Bed Mobility Assistance: Pt sitting on EOB upon arrival Transfers Transfers: Sit to Stand;Stand to Sit Sit to Stand: 4: Min guard;With upper extremity assist;With armrests;From bed Stand to Sit: 4: Min assist;With upper extremity assist;With armrests;To chair/3-in-1 Details for Transfer Assistance: Cues for hand placement, R LE positioning, & safe body positioning before sitting.   Ambulation/Gait Ambulation/Gait Assistance: 4: Min guard Ambulation Distance (Feet): 35 Feet Assistive device: Rolling walker Ambulation/Gait Assistance Details: Cues to reinforce TDWB, sequencing, & RW advancement.  Pt fatigues quickly, took multiple standing rest breaks.  Improved with  advancement of R LE this session.   Gait Pattern: Step-to pattern;Trunk flexed Stairs: No Wheelchair Mobility Wheelchair Mobility: No    Exercises General Exercises - Lower Extremity Hip ABduction/ADduction: AAROM;Right;5 reps Straight Leg Raises: AAROM;Right;5 reps Hip Flexion/Marching: AROM;Strengthening;Right;5 reps     PT Goals Acute Rehab PT Goals Time For Goal Achievement: 01/28/13 Potential to Achieve Goals: Good Pt will go Supine/Side to Sit: with modified independence Pt will go Sit to Supine/Side: with modified independence Pt will go Sit to Stand: with modified independence PT Goal: Sit to Stand - Progress: Progressing toward goal Pt will go Stand to Sit: with modified independence PT Goal: Stand to Sit - Progress: Progressing toward goal Pt will Ambulate: 51 - 150 feet;with modified independence;with rolling walker PT Goal: Ambulate - Progress: Progressing toward goal Pt will Go Up / Down Stairs: Flight;with min assist;with least restrictive assistive device  Visit Information  Last PT Received On: 01/14/13 Assistance Needed: +1    Subjective Data      Cognition  Cognition Arousal/Alertness: Awake/alert Behavior During Therapy: WFL for tasks assessed/performed Overall Cognitive Status: Within Functional Limits for tasks assessed    Balance  Balance Balance Assessed: No  End of Session PT - End of Session Equipment Utilized During Treatment: Gait belt;Other (comment) (cam boot R LE) Activity Tolerance: Patient limited by fatigue Patient left: in chair;with call bell/phone within reach Nurse Communication: Mobility status    Sarajane Marek, [place] [phone] 01/14/2013

## 2013-01-14 NOTE — Progress Notes (Signed)
I met with patient at bedside to discuss rehab venue options.  Pt lives on second floor apartment which would be very difficult with TDWB status to mobilize up the stairs and to be able to get into apartment even with assist of two sons. To boost upstairs on her bottom and then raise up from floor to ambulate into apartment does not seem realistic at this time. Recommend SNF and I have made SW aware. [id number]

## 2013-01-14 NOTE — Care Management Note (Addendum)
    Page 1 of 2   01/15/2013     10:16:14 AM   CARE MANAGEMENT NOTE 01/15/2013  Patient:  DESA, MIDDAUGH   Account Number:  [id number]  Date Initiated:  01/14/2013  Documentation initiated by:  GRAVES-BIGELOW,Charlena Haub  Subjective/Objective Assessment:   Pt admitted with a syncopal episode, she was in afib with RVR but converted on diltiazem gtt to NSR and NSTEMI. S/p OPEN REDUCTION INTERNAL FIXATION (ORIF) ANKLE FRACTURE 01-12-13.     Action/Plan:   CM did speak to the financial counselor in reference to referral received about possible disability. FC stated that pt has a 401k of 3000.00 and will not be able to get medicaid with this amt in 401K.   Anticipated DC Date:  01/15/2013   Anticipated DC Plan:  IP REHAB FACILITY      DC Planning Services  CM consult      Choice offered to / List presented to:     DME arranged  3-N-1      DME agency  [place] arranged  HH-1 RN  HH-10 DISEASE MANAGEMENT  HH-2 PT  [place].   Status of service:  Completed, signed off Medicare Important Message given?   (If response is "NO", the following Medicare IM given date fields will be blank) Date Medicare IM given:   Date Additional Medicare IM given:    Discharge Disposition:  [place]  Per UR Regulation:  Reviewed for med. necessity/level of care/duration of stay  If discussed at [place] of Stay Meetings, dates discussed:    Comments:  01-15-13 1012 Jacqlyn Krauss, RN,BSN [phone] CM did provide pt with a 30 day free card for eliquis along eith pt assistance forms just in case her medicaid does not go through. Pt will f/u in the clinic and if medicaid dose not go through the clinic can help with the orange/purple card. Pt will be able to afford other meds listed. Pt states she still has insulin at home. No further needs from CM at this time.    01-14-13 [address] [place] [phone] CM did speak to pt along with CSW Mickel Baas. Pt is agreeable to [facility] services here in [place] and not [place]. CM did call the MD with the disposition plan and she will place orders in Epic. Pt says that her sons will be there to provide care. CM did ask MD if the [facility] will need to change the surgical dressing and MD stated that Ortho will make recommendaitons. DME 3n1 to be delivered to room before d/c.  No further needs from CM at this time.   01-14-13 [address] [place] [phone] CM will continue to monitor disposition needs.

## 2013-01-14 NOTE — Progress Notes (Signed)
Patient ID: [name], female   DOB: 23-Nov-1955, 57 y.o.   MRN: [id number] Dressing clean and dry this morning. Will have any dressing applied this morning. Patient anticipates that she will be discharged to inpatient rehabilitation. I will followup in 2 weeks.

## 2013-01-15 DIAGNOSIS — I482 Chronic atrial fibrillation, unspecified: Secondary | ICD-10-CM

## 2013-01-15 LAB — CBC
HCT: 26 % — ABNORMAL LOW (ref 36.0–46.0)
Hemoglobin: 8.8 g/dL — ABNORMAL LOW (ref 12.0–15.0)
MCH: 26.4 pg (ref 26.0–34.0)
MCHC: 33.8 g/dL (ref 30.0–36.0)
MCV: 78.1 fL (ref 78.0–100.0)
RBC: 3.33 MIL/uL — ABNORMAL LOW (ref 3.87–5.11)

## 2013-01-15 LAB — GLUCOSE, CAPILLARY
Glucose-Capillary: 128 mg/dL — ABNORMAL HIGH (ref 70–99)
Glucose-Capillary: 141 mg/dL — ABNORMAL HIGH (ref 70–99)

## 2013-01-15 MED ORDER — INSULIN GLARGINE 100 UNIT/ML ~~LOC~~ SOLN: 15.0000 [IU] | Freq: Every day | SUBCUTANEOUS | Status: DC

## 2013-01-15 MED ORDER — APIXABAN 5 MG PO TABS
5.0000 mg | ORAL_TABLET | Freq: Two times a day (BID) | ORAL | Status: DC
  Administered 2013-01-15: 5 mg via ORAL
  Filled 2013-01-15 (×2): qty 1

## 2013-01-15 MED ORDER — NITROGLYCERIN 0.4 MG SL SUBL: 0.4000 mg | SUBLINGUAL_TABLET | SUBLINGUAL | Status: DC | PRN

## 2013-01-15 MED ORDER — DILTIAZEM HCL ER COATED BEADS 240 MG PO CP24: 240.0000 mg | ORAL_CAPSULE | Freq: Every day | ORAL | Status: DC

## 2013-01-15 MED ORDER — ATORVASTATIN CALCIUM 80 MG PO TABS: 80.0000 mg | ORAL_TABLET | Freq: Every day | ORAL | Status: DC

## 2013-01-15 MED ORDER — ISOSORBIDE MONONITRATE ER 30 MG PO TB24: 30.0000 mg | ORAL_TABLET | Freq: Every day | ORAL | Status: DC

## 2013-01-15 MED ORDER — ONDANSETRON HCL 4 MG PO TABS: 4.0000 mg | ORAL_TABLET | Freq: Four times a day (QID) | ORAL | Status: DC | PRN

## 2013-01-15 MED ORDER — APIXABAN 5 MG PO TABS: 5.0000 mg | ORAL_TABLET | Freq: Two times a day (BID) | ORAL | Status: DC

## 2013-01-15 MED ORDER — CARVEDILOL 12.5 MG PO TABS: 12.5000 mg | ORAL_TABLET | Freq: Two times a day (BID) | ORAL | Status: DC

## 2013-01-15 MED ORDER — VITAMIN D (ERGOCALCIFEROL) 1.25 MG (50000 UNIT) PO CAPS: 50000.0000 [IU] | ORAL_CAPSULE | ORAL | Status: DC

## 2013-01-15 MED ORDER — TRAMADOL HCL 50 MG PO TABS: 50.0000 mg | ORAL_TABLET | Freq: Four times a day (QID) | ORAL | Status: DC

## 2013-01-15 MED ORDER — CHLORTHALIDONE 25 MG PO TABS: 25.0000 mg | ORAL_TABLET | Freq: Every day | ORAL | Status: DC

## 2013-01-15 MED ORDER — HYDRALAZINE HCL 25 MG PO TABS: 25.0000 mg | ORAL_TABLET | Freq: Three times a day (TID) | ORAL | Status: DC

## 2013-01-15 NOTE — Progress Notes (Signed)
Patient Name: [name] Date of Encounter: 01/15/2013   Active Problems:   Paroxysmal atrial fibrillation, rapid ventricular response   Hypertension   Diabetes mellitus   Chronic kidney disease, stage III (moderate)   Syncope   History of stroke   Diabetic neuropathy   NSTEMI (non-ST elevated myocardial infarction)   Closed right ankle fracture   CAD (coronary artery disease)   SUBJECTIVE  No chest pain or sob.    CURRENT MEDS . aspirin  81 mg Oral Daily  . atorvastatin  80 mg Oral QHS  . carvedilol  12.5 mg Oral BID WC  . chlorthalidone  25 mg Oral Daily  . diltiazem  240 mg Oral Daily  . hydrALAZINE  25 mg Oral Q8H  . insulin aspart  0-15 Units Subcutaneous TID WC  . insulin glargine  15 Units Subcutaneous QHS  . isosorbide mononitrate  30 mg Oral Daily  . sodium chloride  3 mL Intravenous Q12H  . traMADol  50 mg Oral Q6H  . Vitamin D (Ergocalciferol)  50,000 Units Oral Q Wed    OBJECTIVE  Filed Vitals:   01/14/13 0946 01/14/13 1400 01/14/13 2040 01/15/13 0447  BP: 170/61 130/48 162/61 166/94  Pulse: 75 62 68 62  Temp:  98.6 F (37 C) 98.7 F (37.1 C) 98.6 F (37 C)  TempSrc:  Oral Oral Oral  Resp:  18    Height:      Weight:    254 lb 11.2 oz (115.531 kg)  SpO2:  95% 95% 97%    Intake/Output Summary (Last 24 hours) at 01/15/13 [id number] Last data filed at 01/14/13 1700  Gross per 24 hour  Intake    960 ml  Output      0 ml  Net    960 ml   Filed Weights   01/08/13 1131 01/08/13 2035 01/15/13 0447  Weight: 240 lb (108.863 kg) 246 lb 11.2 oz (111.902 kg) 254 lb 11.2 oz (115.531 kg)    PHYSICAL EXAM  General: Pleasant, NAD. Neuro: Alert and oriented X 3. Moves all extremities spontaneously. HEENT:  Normal  Neck: Supple Lungs:  Resp regular and unlabored, CTA. Heart: RRR  Abdomen: Soft, non-tender, non-distended, BS + x 4.  Extremities: No edema. S/P surgery R ankle  Accessory Clinical Findings  CBC  Recent Labs  01/14/13 0537  01/15/13 0515  WBC 12.1* 10.7*  HGB 9.1* 8.8*  HCT 27.3* 26.0*  MCV 78.9 78.1  PLT 297 [id number]   Basic Metabolic Panel  Recent Labs  01/13/13 0430 01/14/13 0537  NA 142 140  K 4.2 4.0  CL 109 109  CO2 21 20  GLUCOSE 136* 115*  BUN 20 21  CREATININE 2.05* 2.28*  CALCIUM 8.7 8.7   TELE sinus  ASSESSMENT AND PLAN  1.  R ankle Fx:  Management per orthopedics.  2.  NSTEMI/CAD:  No further chest pain.  Reviewed films previously; plan medical therapy for now; proceed with PCI if CP despite meds. Continue ASA 81 mg daily, coreg, cardizem, nitrates and statin.  3.  HTN:  Would add continue hydralazine 25 mg po tid; Increase as needed; can consider ACEI later once it is clear renal function stable.  4.  HL:  LDL 135.  Cont lipitor 80.  5.  Acute on chronic stage III kidney disease:  Follow renal function  6.  Acute on chronic normocytic anemia: Hgb mildly decreased today  7.  DM:  Cont insulin/ssi.  8. PAF - continue  cardizem and beta blocker; begin apixaban 5 mg po BID if ok with orthopedics. Patient can be transferred to rehab from a cardiac standpoint. Signed, Kirk Ruths MD

## 2013-01-15 NOTE — Progress Notes (Signed)
Patient ID: [name], female   DOB: 14-Jul-1956, 57 y.o.   MRN: [id number] FMTS Daily Intern Progress Note  Subjective:  [name] reports no chest pain or shortness of breath, no bleeding or pain, no nausea and she is tolerating PO and urinating normally.  I have reviewed patient's medications. No PRN in 24 hours.   Objective Temp:  [98.6 F (37 C)-98.7 F (37.1 C)] 98.6 F (37 C) (04/24 0447) Pulse Rate:  [62-75] 62 (04/24 0447) Resp:  [18] 18 (04/23 1400) BP: (130-170)/(48-94) 166/94 mmHg (04/24 0447) SpO2:  [95 %-97 %] 97 % (04/24 0447) Weight:  [254 lb 11.2 oz (115.531 kg)] 254 lb 11.2 oz (115.531 kg) (04/24 0447)   Intake/Output Summary (Last 24 hours) at 01/15/13 0615 Last data filed at 01/14/13 1700  Gross per 24 hour  Intake    960 ml  Output      0 ml  Net    960 ml    General: NAD, sitting at bedside, pleasant HEENT: EOMI, sclera clear CV: RRR with very soft I/VI syst murmur heard best RUSB, no carotid bruits, [name] appreciated Pulm: CTAB with fine LLL crackles Abd: Soft, nontender, nondistended, NABS Ext: Right leg wrapped in bandage. C/D/I. Bilateral trace LE edema but no tenderness to ankle Neuro: Awake, alert, no focal deficits, normal speech  Labs and Imaging  Recent Labs Lab 01/12/13 0445 01/13/13 0430 01/14/13 0537  WBC 12.5* 12.4* 12.1*  HGB 9.7* 9.9* 9.1*  HCT 28.9* 28.9* 27.3*  PLT 323 314 297     Recent Labs Lab 01/11/13 0625 01/13/13 0430 01/14/13 0537  NA 139 142 140  K 4.1 4.2 4.0  CL 110 109 109  CO2 22 21 20   BUN 26* 20 21  CREATININE 2.45* 2.05* 2.28*  GLUCOSE 126* 136* 115*  CALCIUM 8.5 8.7 8.7      Recent Labs Lab 01/08/13 1221 01/09/13 0248 01/09/13 0929 01/09/13 1742 01/09/13 2200  TROPONINI <0.30 2.16* 1.66* 0.99* 0.86*   A1c 7   Recent Labs Lab 01/13/13 2046 01/14/13 0726 01/14/13 1126 01/14/13 1732 01/14/13 2043  GLUCAP 147* 106* 146* 136* 163*     01/09/2013 02:48  Cholesterol 204 (H)   Triglycerides 147  HDL 40  LDL (calc) 135 (H)  VLDL 29  Total CHOL/HDL Ratio 5.1    Repeat EKG this morning with no signs of active ischemia  2D echocardiogram 01/09/13: Study Conclusions - Left ventricle: The cavity size was normal. Wall thickness was increased in a pattern of mild LVH. Systolic function was normal. The estimated ejection fraction was in the range of 60% to 65%. Wall motion was normal; there were no regional wall motion abnormalities. Doppler parameters are consistent with abnormal left ventricular relaxation (grade 1 diastolic dysfunction). - Left atrium: The atrium was mildly dilated. Transthoracic echocardiography. M-mode, complete 2D, spectral Doppler, and color Doppler. Height: Height: 170.2cm. Height: 67in. Weight: Weight: 111.6kg. Weight: 245.5lb. Body mass index: BMI: 38.5kg/m^2. Body surface area: BSA: 2.12m^2. Blood pressure: 178/80. Patient status: Inpatient. Location: Bedside.  4/18 cardiac catheterization:  1. 1V CAD  2. NSTEMI   4/19 Carotid doppler - There is no obvious evidence of hemodynamically significant internal carotid artery stenosis >40%. The right vertebral artery is patent with antegrade flow. Unable to visualize the left vertebral artery.  Incidental finding:  There is a vascular structure visualized at the superior left thyroid lobe with low resistant flow.   Assessment and Plan - [name] is a 57 y.o.  year old female with a PMH of hypertension, diabetes, and severe diabetic neuropathy presenting with atrial fibrillation with RVR and a fractured right ankle after a syncopal epidsode 4/17, now s/p right ankle repair.   Ankle Fracture - Occurred 01/08/13 - Oblique fracture of distal fibula, s/p reduction and splinting and then 4/21 repair   - Took to OR once medically cleared by cardiology - Maintain elevated with ice, non weight bearing  - Tylenol and oxycodone for pain PRN --. patient does not tolerate this  medication,orderd tramadol - POD 2 from open reduction internal fixation Right ankle fracture - Stable for discharge per ortho. Touchdown weightbearing right with fracture boot in place.  - Per PT, CIR recommended, however unable to arrange yesterday. Will go home with Home Health PT, RN and SW per pt preference - F/u with ortho in 2 weeks  Atrial Fibrillation with RVR, NSTEMI - Noted A fib on first EKG in ED, given CHADS Score of 4, patient placed on heparin drip and diltiazem drip; Converted to sinus rhythm now on PO dilt and BB  - diltiazen 120 PO daily, coreg increased to 12.5 BID, aspirin, statin increased to atorvastatin 80  - Telemetry  - troponins elevated up to 2 and now trending down to 0.86, EKG with no active ischemia - Echo with EF 60-65% and grade 1 diastolic and mildly dilated LA - Cardiac catheterization 4/18 showed tight left circumflex lesions (tandem 80% lesions in proximal to mid AV groove) that are not critical. Amenable to PCI but would require drug-eluting stent for 50m-60yr  with DM Type II - deciding on medical management at this time as patient refuses hemodialysis if necessary - Long-term anticoagulation plan: Begin apixiban today 5 mg po BID if no bleeding and ok with orthopedics.  - Patient OK to be discharged per cardiology team - NSTEMI/CAD: No further chest pain. Reviewed films previously; plan medical therapy for now; proceed with PCI if CP despite meds. Continue ASA 81 mg daily, coreg, cardizem, nitrates and statin.   Syncope - Unknown etiology, but likely atrial fibrillation with RVR vs other dysrhythmia vs orthostatic hypotension vs PE (Wells Score 1.5) vs vasovagal with nausea; CT head ruled out acute bleed. Echo with LVH, grade I diastolic dysfunction, mild atrial dilation with EF 60-65%  - Treated dysrhythmia as noted above  - Gave IVF to improve vol status  - No need for PE eval since low suspsicion and already on anticoagulation - Carotid doppler without  stenosis - TTE on 4/18 with EF 60-65 % and grade 1 diatolic dysf  Vascular Disease - Noted to have possible small vessel stroke on right lentiform nucleus which is likely old and does not correlated with current condition  - Continue Lipitor, LDL 135 - increased lipitor to 80 per cards  Hypertension - Was very elevated through yesterday, but today more stable at [id number] systolic. - Patient taking carvedilol daily, previously also on amlodipine and chlorthalidone, but ran out months ago  - increased coreg to 12.5mg  BID, added chlorthalidone, changed cardizem yesterday to cardizem CD and increased from 120 to 240 mg daily, and continue nitro drip per cards recs - hydralazine 25 mg po tid started yesterday; can consider ACEI later once it is clear renal function stable.   DM, type 2 with Neurologic Deficits -  - CBGs 131-176 - A1C - 7.0 on 4/17 - Lantus at 20U (home dose 30) and moderate sliding scale   Acute on chronic CKD Stage III  - likely  diabetic nephropathy and worsened by IV contrast - Creat 1.8--> 2.06 -->2.4 (CrCl 44.7 calculated)-->2.28 yesterday; continue to monitor - Avoid nephrotoxic agents  - Pt tells cards she doesn't ever want hemodialysis and they are understandably concerned about PCI if she would refuse if needed  Anemia, Normocytic  - Chronic - Hgb baseline 10; 9.9 on presentation-->7.8 4/20 and received transfusion in setting of NSTEMI, now 9.1 - transfusion threshold of 8.0 with NSTEMI - F/u daily CBC  - Ordered hemoccult to eval if slow GI bleed. If positive, may need to d/c heparin - never performed; f/u as outpatient   Leukocytosis - Day 2. Afebrile. - Likely inflammatory marker from recent surgery.  - Continue to monitor daily  FENGI - Resumed carb modified diet after surgery, SLIV PPX - Start apixaban today for anticoagulation in setting of recent NSTEMI DISPO - home today pending discussion about cost of apixaban.   Conni Slipper [phone] 01/15/2013, 6:15 AM

## 2013-01-15 NOTE — Progress Notes (Signed)
Physical Therapy Treatment Patient Details Name: [name] MRN: [id number] DOB: [birth year] Today's Date: 01/15/2013 Time: 0110-0124 PT Time Calculation (min): 14 min  PT Assessment / Plan / Recommendation Comments on Treatment Session  Per chart review pt does not meet criteria for CIR & CIR is recommending SNF which this clinician agrees with  however per SW note plans are for pt to d/c home with her son.  Pt lives on 2nd floor of apt complex.  She will need ambulance transportation to get her inside her apt at d/c.  She will also need HHPT + (A)/(S) for mobility.       Follow Up Recommendations  SNF;Supervision/Assistance - 24 hour;Home health PT     Does the patient have the potential to tolerate intense rehabilitation     Barriers to Discharge        Equipment Recommendations  Rolling walker with 5" wheels    Recommendations for Other Services    Frequency Min 5X/week   Plan Discharge plan needs to be updated    Precautions / Restrictions Precautions Precautions: Fall Required Braces or Orthoses: Other Brace/Splint Other Brace/Splint: Cam Walker boot to R LE Restrictions RLE Weight Bearing: Touchdown weight bearing       Mobility  Bed Mobility Bed Mobility: Sit to Supine Sit to Supine: 6: Modified independent (Device/Increase time) Transfers Transfers: Sit to Stand;Stand to Sit Sit to Stand: 5: Supervision;With upper extremity assist;From bed Stand to Sit: 5: Supervision;With upper extremity assist;To bed Details for Transfer Assistance: Cues to reinforce R LE positioning to maintain TDWB Ambulation/Gait Ambulation/Gait Assistance: 4: Min guard Ambulation Distance (Feet): 50 Feet Assistive device: Rolling walker Ambulation/Gait Assistance Details: Pt able to increase distance but still requires multiple standing rest breaks due to fatigue.   Gait Pattern: Step-to pattern Stairs: No Wheelchair Mobility Wheelchair Mobility: No      PT Goals Acute Rehab  PT Goals Time For Goal Achievement: 01/28/13 Potential to Achieve Goals: Good Pt will go Supine/Side to Sit: with modified independence Pt will go Sit to Supine/Side: with modified independence PT Goal: Sit to Supine/Side - Progress: Met Pt will go Sit to Stand: with modified independence PT Goal: Sit to Stand - Progress: Progressing toward goal Pt will go Stand to Sit: with modified independence PT Goal: Stand to Sit - Progress: Progressing toward goal Pt will Ambulate: 51 - 150 feet;with modified independence;with rolling walker PT Goal: Ambulate - Progress: Progressing toward goal Pt will Go Up / Down Stairs: Flight;with min assist;with least restrictive assistive device  Visit Information  Last PT Received On: 01/15/13 Assistance Needed: +1    Subjective Data      Cognition  Cognition Arousal/Alertness: Awake/alert Behavior During Therapy: WFL for tasks assessed/performed Overall Cognitive Status: Within Functional Limits for tasks assessed    Balance     End of Session PT - End of Session Equipment Utilized During Treatment: Gait belt;Other (comment) (cam boot) Activity Tolerance: Patient tolerated treatment well Patient left: in bed;with call bell/phone within reach     [facility], [place] [phone] 01/15/2013

## 2013-01-15 NOTE — Progress Notes (Signed)
Agree with PTA.    Amer Alcindor, PT [phone]  

## 2013-01-15 NOTE — Progress Notes (Signed)
I discussed with Dr Dianah Field.  I agree with their plans documented in their progress note for today.

## 2013-01-15 NOTE — Progress Notes (Signed)
I discussed with Dr Raoul Pitch.  I agree with their plans documented in their progress note for today.

## 2013-01-16 ENCOUNTER — Inpatient Hospital Stay: Admitting: Family Medicine

## 2013-01-16 NOTE — Discharge Summary (Signed)
I discussed with Dr Dianah Field.  I agree with their plans documented in their discharge note for today.

## 2013-01-20 ENCOUNTER — Telehealth: Payer: Self-pay | Admitting: Family Medicine

## 2013-01-20 NOTE — Telephone Encounter (Addendum)
Patient is asymptomatic---no chest pain, headache, numbness, shortness of breath.  O2 sat--"mid-80's--not her normal."  Patient is not on any O2 at home.  Nurse reviewed patient's meds and she is currently taking all of her meds.  Patient was under our care in hospital. Has new patient appt with Dr. Dianah Field on 01/23/13.  PT has already left patient's home.  Will discuss with Dr. Dianah Field for advice and call patient back.  Nolene Ebbs, RN

## 2013-01-20 NOTE — Telephone Encounter (Signed)
Returned call to patient.  Denies shortness of breath, fever, lower leg swelling, and pain.  States she has been keeping her leg elevated.  Nurse came earlier and patient states her BP was 158/80, but doesn't recall what the O2 sat was.  "Does not think the therapist checked my oxygen level right."  Did not have PT today due to therapist saying "BP was high."  Has follow-up appt here on Friday.  Patient does not think she is able to go down stairs and will discuss with PT before her appt on Friday.  Nolene Ebbs, RN

## 2013-01-20 NOTE — Telephone Encounter (Signed)
Pt has not been here yet, but has an appt this Friday w/ Dr T.  She is at the home of the patient and reports a BP of 178/84 - needs to talk to nurse about what to do

## 2013-01-23 ENCOUNTER — Ambulatory Visit (INDEPENDENT_AMBULATORY_CARE_PROVIDER_SITE_OTHER): Payer: Medicare Other | Admitting: Family Medicine

## 2013-01-23 ENCOUNTER — Encounter: Payer: Self-pay | Admitting: Family Medicine

## 2013-01-23 VITALS — BP 147/71 | HR 59 | Wt 246.0 lb

## 2013-01-23 DIAGNOSIS — S8290XD Unspecified fracture of unspecified lower leg, subsequent encounter for closed fracture with routine healing: Secondary | ICD-10-CM

## 2013-01-23 DIAGNOSIS — D649 Anemia, unspecified: Secondary | ICD-10-CM

## 2013-01-23 DIAGNOSIS — E119 Type 2 diabetes mellitus without complications: Secondary | ICD-10-CM

## 2013-01-23 DIAGNOSIS — E11319 Type 2 diabetes mellitus with unspecified diabetic retinopathy without macular edema: Secondary | ICD-10-CM

## 2013-01-23 DIAGNOSIS — S82891D Other fracture of right lower leg, subsequent encounter for closed fracture with routine healing: Secondary | ICD-10-CM

## 2013-01-23 DIAGNOSIS — I10 Essential (primary) hypertension: Secondary | ICD-10-CM

## 2013-01-23 DIAGNOSIS — E1139 Type 2 diabetes mellitus with other diabetic ophthalmic complication: Secondary | ICD-10-CM

## 2013-01-23 DIAGNOSIS — I4891 Unspecified atrial fibrillation: Secondary | ICD-10-CM

## 2013-01-23 DIAGNOSIS — N183 Chronic kidney disease, stage 3 unspecified: Secondary | ICD-10-CM

## 2013-01-23 LAB — BASIC METABOLIC PANEL
BUN: 29 mg/dL — ABNORMAL HIGH (ref 6–23)
Calcium: 8.9 mg/dL (ref 8.4–10.5)
Potassium: 4.9 mEq/L (ref 3.5–5.3)

## 2013-01-23 LAB — CBC
HCT: 24.8 % — ABNORMAL LOW (ref 36.0–46.0)
MCH: 25.6 pg — ABNORMAL LOW (ref 26.0–34.0)
MCHC: 32.7 g/dL (ref 30.0–36.0)
RDW: 14.7 % (ref 11.5–15.5)

## 2013-01-23 NOTE — Assessment & Plan Note (Addendum)
-   Continue current regimen (cardizem cd 240 mg, hydralazine 25mg  TID, imdur 24 hr 30mg , chlorthalidone 25mg , carvedilol 12.5mg BID). - Keep diary of AM, noon, and PM BP and medications taking daily - F/u in 1-2 weeks - Review all meds at f/u - Eventually add ARB (cough with ace-i)

## 2013-01-23 NOTE — Progress Notes (Signed)
Subjective:     Patient ID: [name], female   DOB: Sep 18, 1956, 57 y.o.   MRN: [id number]  [name] follow up  HPI  This is a 57 y.o. female with recent hospitalization due to paroxismal atrial fibrillation with RVR, syncope resulting in fall with ankle fracture now s/p surgical repair, and NSTEMI here for hospital follow-up.   1. Right oblique fracture of distal fibula, s/p repair - Pt reports mild lateral right leg throbbing pain, though with severe peripheral neuropathy it is difficult for her to tell. Denies excess bleeding, fever, or chills, though uncertain how incision site looks due to it being wrapped since surgery. Has surgery f/u May 8. Has Home Health PT.   2. Paroxysmal atrial fibrillation with RVR - RVR during admission, resolved during hospitalization. Taking diltiazem 240mg  24 hour capsule and carvedilol 12.5mg  BID. Denies side effects. Denies syncope, chest pain, palpitations. Got 1 month free apixaban at discharge and has HHSW to help patient with obtaining more. She thinks she has to fill out some forms for this and will look into it.  3. NSTEMI - Likely from demand ischemia due to atrial fibrillation with RVR. Troponin was resolving to 0.86 by discharge. No further chest pain, palpitations, and only one episode of mild SOB that relieved with nitroglycerin. Has cardiology follow-up in 2 weeks. Denies nausea this week.  4. HTN - Patient had some very elevated Blood pressures in hospital and BP was better controlled with 24 hour diltiazem 240mg , hydralazine 25mg  TID, imdur 24 hour 30mg , chlorthalidone 25mg , carvedilol 12.5mg  BID. Blurred vision - Patient reports diabetic retinopathy but feels vision has become more blurred lately. Attempted retinal scan here in clinic but poor quality image likely due to multiple previous surgeries. Recommended patient follow up with her ophthalmologist sooner than scheduled visit in October for evaluation.   Denies chest pain, SOB,  fever, chills, noticed blood loss, or other concerning symptoms to her. Is just worried about losing her foot and about her blood pressure.  Review of Systems - Per HPI.  PMH, SH, and FH reviewed. Past Medical History  Diagnosis Date  . Hypertension   . Diabetes mellitus   . Chronic kidney disease, stage III (moderate)   Paroxismal Atrial fibrillation with h/o RVR H/o NSTEMI Patient does not believe she needs any refills at this time.     Objective:   Physical Exam BP 147/71  Pulse 59  Wt 246 lb (111.585 kg)  BMI 38.52 kg/m2; BP on recheck manual 134/84, T 98.2 GEN: NAD, pleasant CV: RRR, no murmurs, rubs, gallops, bilateral legs warm and well perfused  PULM: CTAB, no wheezes or crackles, no increased WOB EXTR: Right foot in boot and wrapped; unwrapped and has 4 inch incision site with edges approximated together, staples in tact, moderate amount of blood-staining on the dressing, no exudate or erythema around incision site, foot is warm and well-perfused but not markedly warmer than left foot, scar tissue forming at proximal 2-3 cm of incision site. Trace-1+ pitting edema right > left. No calf tenderness, swelling, or erythema bilaterally NEURO: Awake, alert, no focal deficits, normal speech, in wheelchair so gait not assessed (due to recent foot surgery)   Retinal scan done but difficult study due to h/o multiple eye surgeries.     Assessment:     This is a 57 y.o. female with recent hospitalization due to paroxismal atrial fibrillation with RVR, syncope resulting in fall with ankle fracture now s/p surgical repair, and NSTEMI  here for hospital follow-up.    Plan:     # Health maintenance - Will need new patient visit at some point to get medical history - [employer] Card - provide information at f/u

## 2013-01-23 NOTE — Assessment & Plan Note (Signed)
-   Will fill out forms for novolog and lantus

## 2013-01-23 NOTE — Patient Instructions (Addendum)
It was good to see you today [name].  For your leg fracture, be sure to follow up with the orthopedist. If you develop shortness of breath or one-sided leg swelling, seek immediate medical care.  For your high blood pressure, continue taking medications as prescribed. Please keep a diary for me of your morning, afternoon, and evening blood pressures daily.  We will not change medications for now.  For your Atrial Fibrillation, continue taking medications including apixaban. You have this for 1 month but be sure to get paperwork filled out for the remaining 11 months this year. Follow up as scheduled with your cardiologist.  For your blurry vision, we had a hard time seeing your retina. Follow up with eye doctor sooner. We are checking labs today and I will call if anything is abnormal.  When you get close to running out of any of your medicines (2 weeks), let your pharmacy know to let us know so you are never out. I will get the forms filled out for your novolog and lantus. Follow up with me in 1-2 weeks to recheck your blood pressure.

## 2013-01-23 NOTE — Assessment & Plan Note (Signed)
Appears controlled on diltiazem and coreg. Continue apixaban.  - Bring paperwork for affordable apixaban. - Follow up as scheduled with cardiologist. - Discuss Orange Card at follow up.

## 2013-01-23 NOTE — Assessment & Plan Note (Addendum)
Incision does not appear infected and appears to be beginning to heal at proximal portion. Will have slightly increased bleeding due to apixaban. - Follow up with Dr. Sharol Given May 8 - Re-wrapped after inspection - Return precautions per AVS - F/u CBC - Continue HHPT - Weekly vitamin D supplement and f/u Vitamin D level at 8 weeks

## 2013-01-23 NOTE — Assessment & Plan Note (Signed)
-   BMET today

## 2013-01-23 NOTE — Assessment & Plan Note (Signed)
Unable to obtain quality retinal scan today. Complains of increased blurriness. Could be due to elevated BP at home vs worsened diabetic retinopathy. - Call for sooner follow up with ophthalmologist

## 2013-01-29 ENCOUNTER — Telehealth: Payer: Self-pay | Admitting: Family Medicine

## 2013-01-29 NOTE — Telephone Encounter (Signed)
Called to let patient know that Cr, WBC and PLT elevated. Creatinine change is <30% (2.58), WBC mildly elevated (11.1), and PLT (551) likely due to inflammation with healing surgical incision. Pt denies any new fevers and reports follow-up with Dr. Sharol Given (orthopedic surgery). Will f/u at next visit with patient.  Conni Slipper 01/29/2013 7:43 PM

## 2013-02-05 ENCOUNTER — Encounter: Admitting: Physician Assistant

## 2013-02-05 ENCOUNTER — Ambulatory Visit: Admitting: Family Medicine

## 2013-02-06 ENCOUNTER — Other Ambulatory Visit: Payer: Self-pay | Admitting: *Deleted

## 2013-02-06 ENCOUNTER — Encounter: Admitting: Cardiology

## 2013-02-06 ENCOUNTER — Ambulatory Visit: Admitting: Family Medicine

## 2013-02-06 ENCOUNTER — Telehealth (HOSPITAL_COMMUNITY): Payer: Self-pay | Admitting: Family Medicine

## 2013-02-06 MED ORDER — HYDRALAZINE HCL 25 MG PO TABS: 25.0000 mg | ORAL_TABLET | Freq: Three times a day (TID) | ORAL | Status: DC

## 2013-02-06 MED ORDER — CARVEDILOL 12.5 MG PO TABS: 12.5000 mg | ORAL_TABLET | Freq: Two times a day (BID) | ORAL | Status: DC

## 2013-02-06 MED ORDER — ISOSORBIDE MONONITRATE ER 30 MG PO TB24: 30.0000 mg | ORAL_TABLET | Freq: Every day | ORAL | Status: DC

## 2013-02-06 MED ORDER — DILTIAZEM HCL ER COATED BEADS 240 MG PO CP24: 240.0000 mg | ORAL_CAPSULE | Freq: Every day | ORAL | Status: DC

## 2013-02-06 NOTE — Progress Notes (Signed)
HPI: 57 year old female for followup of coronary artery disease. Patient recently admitted following a syncopal episode with a resultant fracture of her ankle. She ruled in for a non-ST elevation myocardial infarction. She also had atrial fibrillation on presentation. Echocardiogram in April of 2014 showed normal LV function, mild left atrial enlargement and grade 1 diastolic dysfunction. Cardiac catheterization performed in April of 2014 showed a normal left main, 40% mid LAD and 30% distal, 80% tandem lesions in the proximal to mid AV groove and a 30% right coronary artery. Carotid Dopplers in April of 2014 showed no significant stenosis. CT scan of her head showed age indeterminate small vessel stroke. There was an atypical vascular structure at the superior border of the left thyroid lobe. She has been treated medically with plans for PCI of her circumflex if she develops recurrent symptoms.    Current Outpatient Prescriptions  Medication Sig Dispense Refill  . apixaban (ELIQUIS) 5 MG TABS tablet Take 1 tablet (5 mg total) by mouth 2 (two) times daily. Fill after 02/13/13  60 tablet  1  . apixaban (ELIQUIS) 5 MG TABS tablet Take 1 tablet (5 mg total) by mouth 2 (two) times daily.  60 tablet  0  . aspirin EC 81 MG tablet Take 81 mg by mouth daily.      Marland Kitchen atorvastatin (LIPITOR) 80 MG tablet Take 1 tablet (80 mg total) by mouth at bedtime.  30 tablet  11  . carvedilol (COREG) 12.5 MG tablet Take 1 tablet (12.5 mg total) by mouth 2 (two) times daily with a meal.  60 tablet  0  . chlorthalidone (HYGROTON) 25 MG tablet Take 1 tablet (25 mg total) by mouth daily.  30 tablet  2  . diltiazem (CARDIZEM CD) 240 MG 24 hr capsule Take 1 capsule (240 mg total) by mouth daily.  30 capsule  0  . hydrALAZINE (APRESOLINE) 25 MG tablet Take 1 tablet (25 mg total) by mouth every 8 (eight) hours.  90 tablet  0  . insulin glargine (LANTUS) 100 UNIT/ML injection Inject 0.15 mLs (15 Units total) into the skin at  bedtime.  10 mL  2  . insulin regular (NOVOLIN R,HUMULIN R) 100 units/mL injection Inject 10-20 Units into the skin 3 (three) times daily before meals. Sliding scale of insulin dosage       . isosorbide mononitrate (IMDUR) 30 MG 24 hr tablet Take 1 tablet (30 mg total) by mouth daily.  30 tablet  0  . Multiple Vitamin (MULTIVITAMIN WITH MINERALS) TABS Take 1 tablet by mouth daily.      . nitroGLYCERIN (NITROSTAT) 0.4 MG SL tablet Place 1 tablet (0.4 mg total) under the tongue every 5 (five) minutes as needed for chest pain.  30 tablet  0  . ondansetron (ZOFRAN) 4 MG tablet Take 1 tablet (4 mg total) by mouth every 6 (six) hours as needed for nausea.  20 tablet  0  . traMADol (ULTRAM) 50 MG tablet Take 1 tablet (50 mg total) by mouth every 6 (six) hours.  30 tablet  0  . Vitamin D, Ergocalciferol, (DRISDOL) 50000 UNITS CAPS Take 1 capsule (50,000 Units total) by mouth every Wednesday.  7 capsule  0   No current facility-administered medications for this visit.     Past Medical History  Diagnosis Date  . Hypertension   . Diabetes mellitus   . Chronic kidney disease, stage III (moderate)     Past Surgical History  Procedure Laterality Date  . Laser  surgery for diabetic retinopathy    . Abdominal hysterectomy    . Orif ankle fracture Right 01/12/2013    Procedure: OPEN REDUCTION INTERNAL FIXATION (ORIF) ANKLE FRACTURE;  Surgeon: Newt Minion, MD;  Location: [facility];  Service: Orthopedics;  Laterality: Right;    History   Social History  . Marital Status: Legally Separated    Spouse Name: N/A    Number of Children: N/A  . Years of Education: N/A   Occupational History  . Disabled    Social History Main Topics  . Smoking status: Never Smoker   . Smokeless tobacco: Not on file  . Alcohol Use: No  . Drug Use:   . Sexually Active:    Other Topics Concern  . Not on file   Social History Narrative   Patient lives with her son..    ROS: no fevers or chills, productive cough,  hemoptysis, dysphasia, odynophagia, melena, hematochezia, dysuria, hematuria, rash, seizure activity, orthopnea, PND, pedal edema, claudication. Remaining systems are negative.  Physical Exam: Well-developed well-nourished in no acute distress.  Skin is warm and dry.  HEENT is normal.  Neck is supple.  Chest is clear to auscultation with normal expansion.  Cardiovascular exam is regular rate and rhythm.  Abdominal exam nontender or distended. No masses palpated. Extremities show no edema. neuro grossly intact  ECG     This encounter was created in error - please disregard.

## 2013-02-06 NOTE — Telephone Encounter (Signed)
Please call patient to let her know that I faxed the one physician page required for the patient assistance program applications for both Novlin R insulin and Lantus insulin. Both seem to still have portions the patient needs to send in. She can call them to see they got it and let me know of any issues with that.  Conni Slipper 02/06/2013 10:18 PM

## 2013-02-06 NOTE — Telephone Encounter (Signed)
Refilling coreg, diltiazem 24 hr, hydralazine, and imdur 24 hr x 4 months.

## 2013-02-09 NOTE — Telephone Encounter (Signed)
Pt notified. [name], [name]

## 2013-02-12 ENCOUNTER — Encounter (HOSPITAL_COMMUNITY): Payer: Self-pay | Admitting: *Deleted

## 2013-02-12 ENCOUNTER — Other Ambulatory Visit (HOSPITAL_COMMUNITY): Payer: Self-pay | Admitting: Orthopedic Surgery

## 2013-02-12 MED ORDER — CEFAZOLIN SODIUM-DEXTROSE 2-3 GM-% IV SOLR
2.0000 g | INTRAVENOUS | Status: DC
  Filled 2013-02-12: qty 50

## 2013-02-13 ENCOUNTER — Ambulatory Visit (HOSPITAL_COMMUNITY): Payer: Self-pay

## 2013-02-13 ENCOUNTER — Ambulatory Visit (HOSPITAL_COMMUNITY): Payer: Self-pay | Admitting: Certified Registered"

## 2013-02-13 ENCOUNTER — Encounter (HOSPITAL_COMMUNITY): Payer: Self-pay | Admitting: Certified Registered"

## 2013-02-13 ENCOUNTER — Encounter (HOSPITAL_COMMUNITY)
Admission: RE | Disposition: A | Discharge status: Home / Self Care | Payer: Self-pay | Source: Ambulatory Visit | Attending: Orthopedic Surgery

## 2013-02-13 ENCOUNTER — Inpatient Hospital Stay (HOSPITAL_COMMUNITY)
Admission: RE | Admit: 2013-02-13 | Discharge: 2013-02-15 | DRG: 496 | Disposition: A | Discharge status: Home / Self Care | Payer: MEDICAID | Source: Ambulatory Visit | Attending: Orthopedic Surgery | Admitting: Orthopedic Surgery

## 2013-02-13 DIAGNOSIS — N183 Chronic kidney disease, stage 3 unspecified: Secondary | ICD-10-CM | POA: Diagnosis present

## 2013-02-13 DIAGNOSIS — T8132XA Disruption of internal operation (surgical) wound, not elsewhere classified, initial encounter: Secondary | ICD-10-CM | POA: Diagnosis present

## 2013-02-13 DIAGNOSIS — T847XXA Infection and inflammatory reaction due to other internal orthopedic prosthetic devices, implants and grafts, initial encounter: Principal | ICD-10-CM | POA: Diagnosis present

## 2013-02-13 DIAGNOSIS — T847XXS Infection and inflammatory reaction due to other internal orthopedic prosthetic devices, implants and grafts, sequela: Secondary | ICD-10-CM

## 2013-02-13 DIAGNOSIS — I2510 Atherosclerotic heart disease of native coronary artery without angina pectoris: Secondary | ICD-10-CM | POA: Diagnosis present

## 2013-02-13 DIAGNOSIS — I129 Hypertensive chronic kidney disease with stage 1 through stage 4 chronic kidney disease, or unspecified chronic kidney disease: Secondary | ICD-10-CM | POA: Diagnosis present

## 2013-02-13 DIAGNOSIS — T81329A Deep disruption or dehiscence of operation wound, unspecified, initial encounter: Secondary | ICD-10-CM | POA: Diagnosis present

## 2013-02-13 DIAGNOSIS — Z79899 Other long term (current) drug therapy: Secondary | ICD-10-CM

## 2013-02-13 DIAGNOSIS — I252 Old myocardial infarction: Secondary | ICD-10-CM

## 2013-02-13 DIAGNOSIS — Y831 Surgical operation with implant of artificial internal device as the cause of abnormal reaction of the patient, or of later complication, without mention of misadventure at the time of the procedure: Secondary | ICD-10-CM | POA: Diagnosis present

## 2013-02-13 DIAGNOSIS — E119 Type 2 diabetes mellitus without complications: Secondary | ICD-10-CM | POA: Diagnosis present

## 2013-02-13 HISTORY — DX: Acute myocardial infarction, unspecified: I21.9

## 2013-02-13 HISTORY — DX: Anemia, unspecified: D64.9

## 2013-02-13 HISTORY — DX: Constipation, unspecified: K59.00

## 2013-02-13 HISTORY — DX: Personal history of other medical treatment: Z92.89

## 2013-02-13 HISTORY — PX: HARDWARE REMOVAL: SHX979

## 2013-02-13 LAB — GLUCOSE, CAPILLARY
Glucose-Capillary: 164 mg/dL — ABNORMAL HIGH (ref 70–99)
Glucose-Capillary: 133 mg/dL — ABNORMAL HIGH (ref 70–99)
Glucose-Capillary: 192 mg/dL — ABNORMAL HIGH (ref 70–99)

## 2013-02-13 LAB — CBC
HCT: 29.8 % — ABNORMAL LOW (ref 36.0–46.0)
Hemoglobin: 9.6 g/dL — ABNORMAL LOW (ref 12.0–15.0)
MCH: 25.7 pg — ABNORMAL LOW (ref 26.0–34.0)
MCV: 79.9 fL (ref 78.0–100.0)
Platelets: 315 10*3/uL (ref 150–400)
RBC: 3.73 MIL/uL — ABNORMAL LOW (ref 3.87–5.11)
WBC: 8 10*3/uL (ref 4.0–10.5)

## 2013-02-13 LAB — COMPREHENSIVE METABOLIC PANEL
AST: 18 U/L (ref 0–37)
CO2: 19 mEq/L (ref 19–32)
Calcium: 9.6 mg/dL (ref 8.4–10.5)
Chloride: 106 mEq/L (ref 96–112)
Creatinine, Ser: 2.56 mg/dL — ABNORMAL HIGH (ref 0.50–1.10)
GFR calc Af Amer: 23 mL/min — ABNORMAL LOW (ref >90)
GFR calc non Af Amer: 20 mL/min — ABNORMAL LOW (ref >90)
Glucose, Bld: 164 mg/dL — ABNORMAL HIGH (ref 70–99)
Total Bilirubin: 0.3 mg/dL (ref 0.3–1.2)

## 2013-02-13 LAB — PROTIME-INR: INR: 1.32 (ref 0.00–1.49)

## 2013-02-13 SURGERY — REMOVAL, HARDWARE
Anesthesia: General | Site: Ankle | Laterality: Right | Wound class: Dirty or Infected

## 2013-02-13 MED ORDER — PROPOFOL 10 MG/ML IV BOLUS
INTRAVENOUS | Status: DC | PRN
  Administered 2013-02-13: 200 mg via INTRAVENOUS

## 2013-02-13 MED ORDER — ONDANSETRON HCL 4 MG/2ML IJ SOLN
INTRAMUSCULAR | Status: DC | PRN
  Administered 2013-02-13: 4 mg via INTRAVENOUS

## 2013-02-13 MED ORDER — METOCLOPRAMIDE HCL 10 MG PO TABS: 5.0000 mg | ORAL_TABLET | Freq: Three times a day (TID) | ORAL | Status: DC | PRN

## 2013-02-13 MED ORDER — EPHEDRINE SULFATE 50 MG/ML IJ SOLN
INTRAMUSCULAR | Status: DC | PRN
  Administered 2013-02-13 (×3): 5 mg via INTRAVENOUS

## 2013-02-13 MED ORDER — LIDOCAINE HCL (CARDIAC) 20 MG/ML IV SOLN
INTRAVENOUS | Status: DC | PRN
  Administered 2013-02-13: 80 mg via INTRAVENOUS

## 2013-02-13 MED ORDER — ASPIRIN EC 325 MG PO TBEC
325.0000 mg | DELAYED_RELEASE_TABLET | Freq: Every day | ORAL | Status: DC
  Administered 2013-02-13 – 2013-02-15 (×3): 325 mg via ORAL
  Filled 2013-02-13 (×3): qty 1

## 2013-02-13 MED ORDER — MUPIROCIN 2 % EX OINT
TOPICAL_OINTMENT | CUTANEOUS | Status: AC
  Filled 2013-02-13: qty 22

## 2013-02-13 MED ORDER — HYDRALAZINE HCL 25 MG PO TABS
25.0000 mg | ORAL_TABLET | Freq: Three times a day (TID) | ORAL | Status: DC
  Administered 2013-02-13 – 2013-02-15 (×5): 25 mg via ORAL
  Filled 2013-02-13 (×9): qty 1

## 2013-02-13 MED ORDER — OXYCODONE-ACETAMINOPHEN 5-325 MG PO TABS: 1.0000 | ORAL_TABLET | ORAL | Status: DC | PRN

## 2013-02-13 MED ORDER — MIDAZOLAM HCL 5 MG/5ML IJ SOLN
INTRAMUSCULAR | Status: DC | PRN
  Administered 2013-02-13: 2 mg via INTRAVENOUS

## 2013-02-13 MED ORDER — ISOSORBIDE MONONITRATE ER 30 MG PO TB24
30.0000 mg | ORAL_TABLET | Freq: Every day | ORAL | Status: DC
  Administered 2013-02-14 – 2013-02-15 (×2): 30 mg via ORAL
  Filled 2013-02-13 (×3): qty 1

## 2013-02-13 MED ORDER — HYDROMORPHONE HCL PF 1 MG/ML IJ SOLN: 0.5000 mg | INTRAMUSCULAR | Status: DC | PRN

## 2013-02-13 MED ORDER — VANCOMYCIN HCL 500 MG IV SOLR
INTRAVENOUS | Status: AC
  Filled 2013-02-13: qty 500

## 2013-02-13 MED ORDER — INSULIN GLARGINE 100 UNIT/ML ~~LOC~~ SOLN
15.0000 [IU] | Freq: Every day | SUBCUTANEOUS | Status: DC
  Administered 2013-02-13 – 2013-02-14 (×2): 15 [IU] via SUBCUTANEOUS
  Filled 2013-02-13 (×3): qty 0.15

## 2013-02-13 MED ORDER — ONDANSETRON HCL 4 MG PO TABS: 4.0000 mg | ORAL_TABLET | Freq: Four times a day (QID) | ORAL | Status: DC | PRN

## 2013-02-13 MED ORDER — GENTAMICIN SULFATE 40 MG/ML IJ SOLN
INTRAMUSCULAR | Status: AC
  Filled 2013-02-13: qty 2

## 2013-02-13 MED ORDER — HYDROMORPHONE HCL PF 1 MG/ML IJ SOLN: 0.2500 mg | INTRAMUSCULAR | Status: DC | PRN

## 2013-02-13 MED ORDER — CEFAZOLIN SODIUM-DEXTROSE 2-3 GM-% IV SOLR
2.0000 g | Freq: Four times a day (QID) | INTRAVENOUS | Status: AC
  Administered 2013-02-13 – 2013-02-14 (×3): 2 g via INTRAVENOUS
  Filled 2013-02-13 (×3): qty 50

## 2013-02-13 MED ORDER — METOCLOPRAMIDE HCL 5 MG/ML IJ SOLN: 5.0000 mg | Freq: Three times a day (TID) | INTRAMUSCULAR | Status: DC | PRN

## 2013-02-13 MED ORDER — INSULIN ASPART 100 UNIT/ML ~~LOC~~ SOLN
4.0000 [IU] | Freq: Three times a day (TID) | SUBCUTANEOUS | Status: DC
  Administered 2013-02-14 – 2013-02-15 (×3): 4 [IU] via SUBCUTANEOUS

## 2013-02-13 MED ORDER — INSULIN ASPART 100 UNIT/ML ~~LOC~~ SOLN
0.0000 [IU] | Freq: Three times a day (TID) | SUBCUTANEOUS | Status: DC
  Administered 2013-02-14: 2 [IU] via SUBCUTANEOUS
  Administered 2013-02-14: 3 [IU] via SUBCUTANEOUS
  Administered 2013-02-15: 2 [IU] via SUBCUTANEOUS

## 2013-02-13 MED ORDER — CARVEDILOL 12.5 MG PO TABS
12.5000 mg | ORAL_TABLET | Freq: Two times a day (BID) | ORAL | Status: DC
Start: 2013-02-14 — End: 2013-02-15
  Administered 2013-02-14 – 2013-02-15 (×3): 12.5 mg via ORAL
  Filled 2013-02-13 (×5): qty 1

## 2013-02-13 MED ORDER — HYDROCODONE-ACETAMINOPHEN 5-325 MG PO TABS: 1.0000 | ORAL_TABLET | ORAL | Status: DC | PRN

## 2013-02-13 MED ORDER — FENTANYL CITRATE 0.05 MG/ML IJ SOLN
INTRAMUSCULAR | Status: DC | PRN
  Administered 2013-02-13: 25 ug via INTRAVENOUS

## 2013-02-13 MED ORDER — ONDANSETRON HCL 4 MG/2ML IJ SOLN: 4.0000 mg | Freq: Four times a day (QID) | INTRAMUSCULAR | Status: DC | PRN

## 2013-02-13 MED ORDER — CEFAZOLIN SODIUM-DEXTROSE 2-3 GM-% IV SOLR
INTRAVENOUS | Status: DC | PRN
  Administered 2013-02-13: 2 g via INTRAVENOUS

## 2013-02-13 MED ORDER — SODIUM CHLORIDE 0.9 % IV SOLN
INTRAVENOUS | Status: DC
  Administered 2013-02-14: 16:00:00 via INTRAVENOUS

## 2013-02-13 MED ORDER — APIXABAN 5 MG PO TABS
5.0000 mg | ORAL_TABLET | Freq: Two times a day (BID) | ORAL | Status: DC
  Administered 2013-02-13 – 2013-02-15 (×4): 5 mg via ORAL
  Filled 2013-02-13 (×5): qty 1

## 2013-02-13 MED ORDER — CEFAZOLIN SODIUM 1-5 GM-% IV SOLN
1.0000 g | Freq: Three times a day (TID) | INTRAVENOUS | Status: DC
  Administered 2013-02-14 – 2013-02-15 (×3): 1 g via INTRAVENOUS
  Filled 2013-02-13 (×8): qty 50

## 2013-02-13 MED ORDER — 0.9 % SODIUM CHLORIDE (POUR BTL) OPTIME
TOPICAL | Status: DC | PRN
  Administered 2013-02-13: 1000 mL

## 2013-02-13 MED ORDER — ATORVASTATIN CALCIUM 80 MG PO TABS
80.0000 mg | ORAL_TABLET | Freq: Every day | ORAL | Status: DC
  Administered 2013-02-13 – 2013-02-14 (×2): 80 mg via ORAL
  Filled 2013-02-13 (×3): qty 1

## 2013-02-13 MED ORDER — DILTIAZEM HCL ER COATED BEADS 240 MG PO CP24
240.0000 mg | ORAL_CAPSULE | Freq: Every day | ORAL | Status: DC
  Administered 2013-02-14 – 2013-02-15 (×2): 240 mg via ORAL
  Filled 2013-02-13 (×3): qty 1

## 2013-02-13 MED ORDER — VANCOMYCIN HCL 500 MG IV SOLR
INTRAVENOUS | Status: DC | PRN
  Administered 2013-02-13: 500 mg

## 2013-02-13 MED ORDER — LACTATED RINGERS IV SOLN
INTRAVENOUS | Status: DC
  Administered 2013-02-13: 50 mL/h via INTRAVENOUS

## 2013-02-13 MED ORDER — MUPIROCIN 2 % EX OINT
TOPICAL_OINTMENT | Freq: Two times a day (BID) | CUTANEOUS | Status: DC
  Administered 2013-02-13: 1 via NASAL
  Filled 2013-02-13: qty 22

## 2013-02-13 MED ORDER — GENTAMICIN SULFATE 40 MG/ML IJ SOLN
INTRAMUSCULAR | Status: DC | PRN
  Administered 2013-02-13: 80 mg via INTRAMUSCULAR

## 2013-02-13 SURGICAL SUPPLY — 48 items
BANDAGE ELASTIC 4 VELCRO ST LF (GAUZE/BANDAGES/DRESSINGS) ×2 IMPLANT
BANDAGE ELASTIC 6 VELCRO ST LF (GAUZE/BANDAGES/DRESSINGS) ×2 IMPLANT
BANDAGE ESMARK 6X9 LF (GAUZE/BANDAGES/DRESSINGS) IMPLANT
BANDAGE GAUZE ELAST BULKY 4 IN (GAUZE/BANDAGES/DRESSINGS) ×2 IMPLANT
BNDG COHESIVE 4X5 TAN STRL (GAUZE/BANDAGES/DRESSINGS) IMPLANT
BNDG ESMARK 6X9 LF (GAUZE/BANDAGES/DRESSINGS)
CANISTER WOUND CARE 500ML ATS (WOUND CARE) ×2 IMPLANT
CLOTH BEACON ORANGE TIMEOUT ST (SAFETY) ×2 IMPLANT
COVER SURGICAL LIGHT HANDLE (MISCELLANEOUS) ×2 IMPLANT
CUFF TOURNIQUET SINGLE 34IN LL (TOURNIQUET CUFF) IMPLANT
CUFF TOURNIQUET SINGLE 44IN (TOURNIQUET CUFF) IMPLANT
DRAPE C-ARM 42X72 X-RAY (DRAPES) IMPLANT
DRAPE INCISE IOBAN 66X45 STRL (DRAPES) IMPLANT
DRAPE ORTHO SPLIT 77X108 STRL (DRAPES)
DRAPE SURG ORHT 6 SPLT 77X108 (DRAPES) IMPLANT
DRSG EMULSION OIL 3X3 NADH (GAUZE/BANDAGES/DRESSINGS) ×2 IMPLANT
DRSG PAD ABDOMINAL 8X10 ST (GAUZE/BANDAGES/DRESSINGS) ×2 IMPLANT
DRSG VAC ATS SM SENSATRAC (GAUZE/BANDAGES/DRESSINGS) ×2 IMPLANT
ELECT REM PT RETURN 9FT ADLT (ELECTROSURGICAL) ×2
ELECTRODE REM PT RTRN 9FT ADLT (ELECTROSURGICAL) ×1 IMPLANT
GLOVE BIOGEL PI IND STRL 6.5 (GLOVE) ×3 IMPLANT
GLOVE BIOGEL PI IND STRL 9 (GLOVE) ×1 IMPLANT
GLOVE BIOGEL PI INDICATOR 6.5 (GLOVE) ×3
GLOVE BIOGEL PI INDICATOR 9 (GLOVE) ×1
GLOVE SURG ORTHO 9.0 STRL STRW (GLOVE) ×2 IMPLANT
GOWN PREVENTION PLUS XLARGE (GOWN DISPOSABLE) ×2 IMPLANT
GOWN SRG XL XLNG 56XLVL 4 (GOWN DISPOSABLE) ×2 IMPLANT
GOWN STRL NON-REIN XL XLG LVL4 (GOWN DISPOSABLE) ×2
KIT BASIN OR (CUSTOM PROCEDURE TRAY) ×2 IMPLANT
KIT ROOM TURNOVER OR (KITS) ×2 IMPLANT
KIT STIMULAN RAPID CURE 5CC (Orthopedic Implant) ×2 IMPLANT
MANIFOLD NEPTUNE II (INSTRUMENTS) ×2 IMPLANT
NS IRRIG 1000ML POUR BTL (IV SOLUTION) ×2 IMPLANT
PACK ORTHO EXTREMITY (CUSTOM PROCEDURE TRAY) ×2 IMPLANT
PAD ARMBOARD 7.5X6 YLW CONV (MISCELLANEOUS) ×4 IMPLANT
PAD CAST 4YDX4 CTTN HI CHSV (CAST SUPPLIES) ×1 IMPLANT
PADDING CAST COTTON 4X4 STRL (CAST SUPPLIES) ×1
SPONGE GAUZE 4X4 12PLY (GAUZE/BANDAGES/DRESSINGS) ×2 IMPLANT
STAPLER VISISTAT 35W (STAPLE) IMPLANT
STOCKINETTE IMPERVIOUS 9X36 MD (GAUZE/BANDAGES/DRESSINGS) IMPLANT
SUT ETHILON 2 0 PSLX (SUTURE) IMPLANT
SUT VIC AB 0 CT1 27 (SUTURE)
SUT VIC AB 0 CT1 27XBRD ANBCTR (SUTURE) IMPLANT
SUT VIC AB 2-0 CT1 27 (SUTURE)
SUT VIC AB 2-0 CT1 TAPERPNT 27 (SUTURE) IMPLANT
TOWEL OR 17X24 6PK STRL BLUE (TOWEL DISPOSABLE) ×2 IMPLANT
TOWEL OR 17X26 10 PK STRL BLUE (TOWEL DISPOSABLE) ×2 IMPLANT
WATER STERILE IRR 1000ML POUR (IV SOLUTION) ×2 IMPLANT

## 2013-02-13 NOTE — Preoperative (Signed)
Beta Blockers   Reason not to administer Beta Blockers:Carvedilol taken at 1025 hrs on 02/13/2013

## 2013-02-13 NOTE — Progress Notes (Signed)
Orthopedic Tech Progress Note Patient Details:  [name] 1956/07/14 [id number]  Ortho Devices Type of Ortho Device: Postop shoe/boot Ortho Device/Splint Location: RLE Ortho Device/Splint Interventions: Ordered;Application   Braulio Bosch 02/13/2013, 6:53 PM

## 2013-02-13 NOTE — Op Note (Signed)
OPERATIVE REPORT  DATE OF SURGERY: 02/13/2013  PATIENT:  [name],  57 y.o. female  PRE-OPERATIVE DIAGNOSIS:  Infected Hardware Right Ankle  POST-OPERATIVE DIAGNOSIS:  Infected Hardware Right Ankle  PROCEDURE:  Procedure(s): HARDWARE REMOVAL Excisional debridement skin soft tissue muscle and bone. Local tissue rearrangement for wound closure 20 x 5 cm. Placement of antibiotic beads with 500 mg vancomycin 160 mg gentamicin Placement of a wound VAC.  SURGEON:  Surgeon(s): Newt Minion, MD  ANESTHESIA:   general  EBL:  min ML  SPECIMEN:  No Specimen  TOURNIQUET:  * No tourniquets in log *  PROCEDURE DETAILS: Patient is a 57 year old woman with necrotic breakdown of the surgical incision for open reduction internal fixation of her right ankle fracture. She presents at this time for removal of deep retained hardware placement of antibiotic beads wound closure placement of a wound VAC. Risks and benefits of surgery were discussed including persistent infection neurovascular injury need for additional surgery. Patient states she understands and wished to proceed at this time. Description of procedure patient was brought to the operating room and underwent a general anesthetic. After adequate levels of anesthesia were obtained patient's right lower extremity was prepped using DuraPrep draped into a sterile field. The necrotic tissue was ellipsed out in one block of tissue the wound is approximately 20 x 5 mm. The necrotic tissue was removed the hardware was removed the bone was debrided with a periosteal elevator. The wound was irrigated with normal saline the wound was excisionally debrided. After removal hardware and debridement of the bones 6 skin soft tissue muscle antibiotic beads were made on the back table and these were placed within the wound with 500 mg vancomycin 160 mg gentamicin. The wound is closed using 2-0 nylon with local tissue rearrangement with 2-0 nylon. The wound  was covered with a wound VAC set to -75 mm of suction. Patient was extubated taken the PACU in stable condition.  PLAN OF CARE: Admit to inpatient   PATIENT DISPOSITION:  PACU - hemodynamically stable.   Newt Minion, MD 02/13/2013 2:58 PM

## 2013-02-13 NOTE — Anesthesia Preprocedure Evaluation (Addendum)
Anesthesia Evaluation  Patient identified by MRN, date of birth, ID band Patient awake    Reviewed: Allergy & Precautions, H&P , NPO status , Patient's Chart, lab work & pertinent test results, reviewed documented beta blocker date and time   Airway Mallampati: III TM Distance: >3 FB Neck ROM: Full    Dental  (+) Teeth Intact and Dental Advisory Given   Pulmonary neg pulmonary ROS,  breath sounds clear to auscultation        Cardiovascular hypertension, Pt. on medications and Pt. on home beta blockers + CAD and + Past MI + dysrhythmias Atrial Fibrillation Rhythm:Regular Rate:Normal     Neuro/Psych    GI/Hepatic negative GI ROS, Neg liver ROS,   Endo/Other  diabetes, Well Controlled, Type 2, Insulin Dependent  Renal/GU Renal InsufficiencyRenal disease     Musculoskeletal   Abdominal   Peds  Hematology   Anesthesia Other Findings   Reproductive/Obstetrics                          Anesthesia Physical Anesthesia Plan  ASA: IV and emergent  Anesthesia Plan: General   Post-op Pain Management:    Induction: Intravenous  Airway Management Planned: Oral ETT  Additional Equipment:   Intra-op Plan:   Post-operative Plan: Extubation in OR  Informed Consent: I have reviewed the patients History and Physical, chart, labs and discussed the procedure including the risks, benefits and alternatives for the proposed anesthesia with the patient or authorized representative who has indicated his/her understanding and acceptance.   Dental advisory given and History available from chart only  Plan Discussed with: CRNA, Anesthesiologist and Surgeon  Anesthesia Plan Comments:         Anesthesia Quick Evaluation

## 2013-02-13 NOTE — Anesthesia Procedure Notes (Signed)
Procedure Name: LMA Insertion Date/Time: 02/13/2013 2:26 PM Performed by: Ned Grace Pre-anesthesia Checklist: Patient identified, Timeout performed, Emergency Drugs available, Suction available and Patient being monitored Patient Re-evaluated:Patient Re-evaluated prior to inductionOxygen Delivery Method: Circle system utilized Preoxygenation: Pre-oxygenation with 100% oxygen Intubation Type: IV induction Ventilation: Mask ventilation without difficulty LMA: LMA inserted LMA Size: 4.0 Number of attempts: 2 Placement Confirmation: positive ETCO2 and breath sounds checked- equal and bilateral Tube secured with: Tape Dental Injury: Teeth and Oropharynx as per pre-operative assessment

## 2013-02-13 NOTE — Progress Notes (Signed)
Patient ID: [name], female   DOB: 10/10/1955, 57 y.o.   MRN: [id number] Anticipate 2-3 days of wound VAC and IV antibiotics to assist with wound closure. Anticipate discharge Sunday or Monday. Will keep wound VAC in place until then.

## 2013-02-13 NOTE — Transfer of Care (Signed)
Immediate Anesthesia Transfer of Care Note  Patient: [name]  Procedure(s) Performed: Procedure(s) with comments: HARDWARE REMOVAL (Right) - Right Ankle Removal Hardware, Debridement, Place Wound VAC  Patient Location: PACU  Anesthesia Type:General  Level of Consciousness: awake, alert , oriented and patient cooperative  Airway & Oxygen Therapy: Patient Spontanous Breathing and Patient connected to nasal cannula oxygen  Post-op Assessment: Report given to PACU RN, Post -op Vital signs reviewed and stable and Patient moving all extremities  Post vital signs: Reviewed and stable  Complications: No apparent anesthesia complications

## 2013-02-13 NOTE — H&P (Signed)
[name] is an 57 y.o. female.   Chief Complaint: Wound dehiscence right ankle with exposed hardware. HPI: Patient is a 57 year old woman who is status post open reduction internal fixation Weber B. fracture right ankle. Patient has had progressive dehiscence of the wound now with exposed hardware she presents for removal of deep retained hardware and wound closure application of wound VAC.  Past Medical History  Diagnosis Date  . Hypertension   . Diabetes mellitus   . Myocardial infarction     in April 2014  . Heart murmur     "slight"  . Chronic kidney disease, stage III (moderate)   . Constipation   . Anemia   . History of blood transfusion     Past Surgical History  Procedure Laterality Date  . Laser surgery for diabetic retinopathy    . Abdominal hysterectomy    . Orif ankle fracture Right 01/12/2013    Procedure: OPEN REDUCTION INTERNAL FIXATION (ORIF) ANKLE FRACTURE;  Surgeon: Newt Minion, MD;  Location: [place];  Service: Orthopedics;  Laterality: Right;  . Eye surgery      Family History  Problem Relation Age of Onset  . Hypertension Father     Also had CAD  . Diabetes Mother     No history CAD   Social History:  reports that she has never smoked. She does not have any smokeless tobacco history on file. She reports that she does not drink alcohol or use illicit drugs.  Allergies:  Allergies  Allergen Reactions  . Lisinopril Other (See Comments)    coughing  . Peanut-Containing Drug Products Itching    No prescriptions prior to admission    No results found for this or any previous visit (from the past 48 hour(s)). No results found.  Review of Systems  All other systems reviewed and are negative.    There were no vitals taken for this visit. Physical Exam  On examination patient has dehiscence of the surgical wound there is exposed hardware. Radiograph shows stable alignment of the fracture. Assessment/Plan Assessment: Dehiscence wound with  exposed hardware right ankle.  Plan: Will plan for excision of the necrotic tissue removal of deep retained hardware placement of antibiotic beads and wound closure. Risks and benefits were discussed including infection neurovascular injury nonhealing of the wound need for additional surgery. Patient states she understands and wished to proceed at this time.  DUDA,MARCUS V 02/13/2013, 6:35 AM

## 2013-02-13 NOTE — Anesthesia Postprocedure Evaluation (Signed)
Anesthesia Post Note  Patient: [name]  Procedure(s) Performed: Procedure(s) (LRB): HARDWARE REMOVAL (Right)  Anesthesia type: general  Patient location: PACU  Post pain: Pain level controlled  Post assessment: Patient's Cardiovascular Status Stable  Last Vitals:  Filed Vitals:   02/13/13 1555  BP: 165/71  Pulse: 57  Temp:   Resp: 15    Post vital signs: Reviewed and stable  Level of consciousness: sedated  Complications: No apparent anesthesia complications

## 2013-02-14 LAB — GLUCOSE, CAPILLARY: Glucose-Capillary: 48 mg/dL — ABNORMAL LOW (ref 70–99)

## 2013-02-14 MED ORDER — PANTOPRAZOLE SODIUM 40 MG PO TBEC
40.0000 mg | DELAYED_RELEASE_TABLET | Freq: Once | ORAL | Status: AC
  Administered 2013-02-14: 40 mg via ORAL
  Filled 2013-02-14: qty 1

## 2013-02-14 MED ORDER — NITROGLYCERIN 0.4 MG SL SUBL
0.4000 mg | SUBLINGUAL_TABLET | SUBLINGUAL | Status: DC | PRN
  Administered 2013-02-14: 0.4 mg via SUBLINGUAL
  Filled 2013-02-14: qty 25

## 2013-02-14 NOTE — Progress Notes (Signed)
Subjective: 1 Day Post-Op Procedure(s) (LRB): HARDWARE REMOVAL (Right) Patient reports pain as mild.    Objective: Vital signs in last 24 hours: Temp:  [97.6 F (36.4 C)-98.7 F (37.1 C)] 98.6 F (37 C) (05/24 0513) Pulse Rate:  [52-68] 68 (05/24 0513) Resp:  [11-20] 18 (05/24 0513) BP: (125-181)/(49-91) 158/78 mmHg (05/24 0509) SpO2:  [93 %-100 %] 96 % (05/24 0513) Weight:  [101.7 kg (224 lb 3.3 oz)-105.235 kg (232 lb)] 105.235 kg (232 lb) (05/23 1800)  Intake/Output from previous day: 05/23 0701 - 05/24 0700 In: 1183.3 [I.V.:1033.3; IV Piggyback:150] Out: -  Intake/Output this shift:     Recent Labs  02/13/13 1050  HGB 9.6*    Recent Labs  02/13/13 1050  WBC 8.0  RBC 3.73*  HCT 29.8*  PLT 315    Recent Labs  02/13/13 1050  NA 139  K 4.5  CL 106  CO2 19  BUN 47*  CREATININE 2.56*  GLUCOSE 164*  CALCIUM 9.6    Recent Labs  02/13/13 1050  INR 1.32    Intact pulses distally Dorsiflexion/Plantar flexion intact Incision: dressing C/D/I VAC over right ankle wound with good seal   Assessment/Plan: 1 Day Post-Op Procedure(s) (LRB): HARDWARE REMOVAL (Right) Continue VAC over right ankle wound thru the weekend  [facility] 02/14/2013, 9:07 AM

## 2013-02-14 NOTE — Evaluation (Signed)
Physical Therapy Evaluation Patient Details Name: [name] MRN: [id number] DOB: 1956-06-29 Today's Date: 02/14/2013 Time: [id number] PT Time Calculation (min): 21 min  PT Assessment / Plan / Recommendation Clinical Impression  Pt is a 57 y.o. female s/p L ankle hardware removal. Pt is refusing PT in acute setting at this time. Discussed benefits of PT, pt continued to deny. Did agree to assessment of bed mobility. Pt at mod I for bed mobility. States she will comply with HHPT upon acute D/C. Recommend 3 in 1 commode for D/C.     PT Assessment   (refuses)    Follow Up Recommendations  Home health PT;Supervision - Intermittent    Does the patient have the potential to tolerate intense rehabilitation      Barriers to Discharge        Equipment Recommendations  Other (comment) (3 in 1 commode)    Recommendations for Other Services     Frequency      Precautions / Restrictions Precautions Precautions: Fall Precaution Comments: Wound VAC Required Braces or Orthoses: Other Brace/Splint Other Brace/Splint: post-op shoe  Restrictions Weight Bearing Restrictions: Yes RLE Weight Bearing: Non weight bearing   Pertinent Vitals/Pain Denies pain. No new complaints.       Mobility  Bed Mobility Bed Mobility: Supine to Sit;Sitting - Scoot to Edge of Bed;Sit to Supine Supine to Sit: 6: Modified independent (Device/Increase time);HOB elevated;With rails Sitting - Scoot to Edge of Bed: 6: Modified independent (Device/Increase time) Sit to Supine: 6: Modified independent (Device/Increase time);With rail;HOB elevated Details for Bed Mobility Assistance: pt demo good technique for bed mobility.  Transfers Transfers: Not assessed (refused) Ambulation/Gait Ambulation/Gait Assistance: Not tested (comment) (refused) Stairs: No Wheelchair Mobility Wheelchair Mobility: No    Exercises Total Joint Exercises Ankle Circles/Pumps: AROM;Both;10 reps;Supine   PT Diagnosis:    PT  Problem List:   PT Treatment Interventions:     PT Goals    Visit Information  Last PT Received On: 02/14/13 Assistance Needed: +1    Subjective Data  Subjective: pt required max encouragment to participate; intially stated " i dont want physical therapy on this foot. I know how to do everything i need to do." pt was agreeable to sitting EOB and possibly transfering to chair  Patient Stated Goal: home with son   Prior Functioning  Home Living Lives With: Son Available Help at Discharge: Family;Available 24 hours/day Type of Home: Apartment Home Access: Stairs to enter [employer] of Steps: 7 (x2) Entrance Stairs-Rails: Right (uses RW on L side ) Home Layout: One level Bathroom Shower/Tub: Product/process development scientist: Standard Bathroom Accessibility: Yes How Accessible: Accessible via walker Home Adaptive Equipment: Walker - rolling Prior Function Level of Independence: Independent with assistive device(s) (has using RW for month; completely indpendent piror) Able to Take Stairs?: Yes (has been "bumping" up/down on rear) Driving: No Vocation: On disability Communication Communication: No difficulties Dominant Hand: Left    Cognition  Cognition Arousal/Alertness: Awake/alert Behavior During Therapy: WFL for tasks assessed/performed Overall Cognitive Status: Within Functional Limits for tasks assessed    Extremity/Trunk Assessment Right Lower Extremity Assessment RLE ROM/Strength/Tone: WFL for tasks assessed (able to perform SLR and LAQ) RLE Sensation: WFL - Light Touch Left Lower Extremity Assessment LLE ROM/Strength/Tone: WFL for tasks assessed LLE Sensation: WFL - Light Touch Trunk Assessment Trunk Assessment: Kyphotic   Balance Balance Balance Assessed: Yes Static Sitting Balance Static Sitting - Balance Support: Left upper extremity supported;Feet unsupported Static Sitting - Level of Assistance: 6:  Modified independent (Device/Increase  time) Static Sitting - Comment/# of Minutes: pt tolerated sitting EOB ~7  min   End of Session PT - End of Session Activity Tolerance: Patient tolerated treatment well Patient left: in bed;with call bell/phone within reach Nurse Communication: Mobility status;Other (comment) (refusing therapy )  GP     Gustavus Bryant, [place] 02/14/2013, 12:35 PM

## 2013-02-15 MED ORDER — TRAMADOL HCL 50 MG PO TABS: 50.0000 mg | ORAL_TABLET | Freq: Four times a day (QID) | ORAL | Status: DC | PRN

## 2013-02-15 NOTE — Discharge Summary (Signed)
Physician Discharge Summary  Patient ID: [name] MRN: [id number] DOB/AGE: 12/31/1955 57 y.o.  Admit date: 02/13/2013 Discharge date: 02/15/2013  Admission Diagnoses: Complication of internal fixation right ankle infection.  Discharge Diagnoses: Complication of internal fixation with infection right ankle  Active Problems:   * No active hospital problems. *   Discharged Condition: stable  Hospital Course: Patient's hospital course was essentially unremarkable. She will underwent excision of necrotic tissue right ankle removal of deep retained hardware excisional debridement skin soft tissue muscle and bone placement of antibiotic beads and wound closure with local tissue rearrangement and his mother the wound VAC.  Consults: None  Significant Diagnostic Studies: labs: Routine labs  Treatments: surgery: See operative note  Discharge Exam: Blood pressure 162/63, pulse 62, temperature 98.3 F (36.8 C), temperature source Oral, resp. rate 18, height 5\' 7"  (1.702 m), weight 105.235 kg (232 lb), SpO2 95.00%. Incision/Wound:  Wound edges well approximated small amount of clear serosanguineous drainage good wrinkling of the skin no tension on the skin.  Disposition: 06-Home-Health Care Svc  Discharge Orders   Future Appointments Provider Department Dept Phone   02/17/2013 2:30 PM Hilton Sinclair, MD [place] [phone]   04/24/2013 1:30 PM Lelon Perla, MD [place]) [phone]   07/08/2013 2:00 PM Hayden Pedro, MD [place] [phone]   Future Orders Complete By Expires     Call MD / Call 911  As directed     Comments:      If you experience chest pain or shortness of breath, CALL 911 and be transported to the hospital emergency room.  If you develope a fever above 101 F, pus (white drainage) or increased drainage or redness at the wound, or calf pain, call your surgeon's office.    Constipation Prevention  As directed     Comments:      Drink plenty of fluids.  Prune juice may be helpful.  You may use a stool softener, such as Colace (over the counter) 100 mg twice a day.  Use MiraLax (over the counter) for constipation as needed.    Diet - low sodium heart healthy  As directed     Increase activity slowly as tolerated  As directed     Non weight bearing  As directed     Scheduling Instructions:      Right foot        Medication List    TAKE these medications       apixaban 5 MG Tabs tablet  Commonly known as:  ELIQUIS  Take 1 tablet (5 mg total) by mouth 2 (two) times daily. Fill after 02/13/13     aspirin EC 81 MG tablet  Take 81 mg by mouth daily.     atorvastatin 80 MG tablet  Commonly known as:  LIPITOR  Take 1 tablet (80 mg total) by mouth at bedtime.     carvedilol 12.5 MG tablet  Commonly known as:  COREG  Take 1 tablet (12.5 mg total) by mouth 2 (two) times daily with a meal.     diltiazem 240 MG 24 hr capsule  Commonly known as:  CARDIZEM CD  Take 1 capsule (240 mg total) by mouth daily.     diphenhydramine-acetaminophen 25-500 MG Tabs  Commonly known as:  TYLENOL PM  Take 1 tablet by mouth at bedtime as needed (for sleep).     hydrALAZINE 25 MG tablet  Commonly known  as:  APRESOLINE  Take 1 tablet (25 mg total) by mouth every 8 (eight) hours.     insulin glargine 100 UNIT/ML injection  Commonly known as:  LANTUS  Inject 0.15 mLs (15 Units total) into the skin at bedtime.     insulin regular 100 units/mL injection  Commonly known as:  NOVOLIN R,HUMULIN R  Inject 10-20 Units into the skin 3 (three) times daily before meals. Sliding scale of insulin dosage     isosorbide mononitrate 30 MG 24 hr tablet  Commonly known as:  IMDUR  Take 30 mg by mouth daily.     multivitamin with minerals Tabs  Take 1 tablet by mouth daily.     nitroGLYCERIN 0.4 MG SL tablet  Commonly known as:  NITROSTAT  Place 1 tablet (0.4 mg total) under the  tongue every 5 (five) minutes as needed for chest pain.     traMADol 50 MG tablet  Commonly known as:  ULTRAM  Take 1 tablet (50 mg total) by mouth every 6 (six) hours as needed for pain. Maximum dose= 8 tablets per day     Vitamin D (Ergocalciferol) 50000 UNITS Caps  Commonly known as:  DRISDOL  Take 1 capsule (50,000 Units total) by mouth every Wednesday.           Follow-up Information   Follow up with DUDA,MARCUS V, MD In 1 week.   Contact information:   [place] [address] [phone]       Signed: Newt Minion 02/15/2013, 7:33 AM

## 2013-02-17 ENCOUNTER — Ambulatory Visit: Admitting: Family Medicine

## 2013-02-18 ENCOUNTER — Encounter (HOSPITAL_COMMUNITY): Payer: Self-pay | Admitting: Orthopedic Surgery

## 2013-03-17 DIAGNOSIS — T8149XA Infection following a procedure, other surgical site, initial encounter: Secondary | ICD-10-CM | POA: Insufficient documentation

## 2013-03-17 DIAGNOSIS — IMO0002 Reserved for concepts with insufficient information to code with codable children: Secondary | ICD-10-CM | POA: Insufficient documentation

## 2013-03-17 HISTORY — DX: Infection following a procedure, other surgical site, initial encounter: T81.49XA

## 2013-03-19 ENCOUNTER — Telehealth: Payer: Self-pay | Admitting: Family Medicine

## 2013-03-19 NOTE — Telephone Encounter (Signed)
Will fwd to Md for advice on refills.  [name], [name], [name]

## 2013-03-19 NOTE — Telephone Encounter (Signed)
The pharmacy has told the patient that she needs approval from Dr. Darene Lamer for refills on Eliquis and Vitamin D.  She has an appointment tomorrow, but she just had surgery on her ankle in [place] so she will be canceling the appointment.

## 2013-03-20 ENCOUNTER — Other Ambulatory Visit: Payer: Self-pay | Admitting: *Deleted

## 2013-03-20 ENCOUNTER — Other Ambulatory Visit (HOSPITAL_COMMUNITY): Payer: Self-pay | Admitting: Family Medicine

## 2013-03-20 ENCOUNTER — Ambulatory Visit: Admitting: Family Medicine

## 2013-03-20 NOTE — Telephone Encounter (Signed)
Eliquis has to pre-approved but so does Diltiazem which she needs refilled as well.

## 2013-03-24 ENCOUNTER — Telehealth: Payer: Self-pay | Admitting: Family Medicine

## 2013-03-24 MED ORDER — APIXABAN 5 MG PO TABS: 5.0000 mg | ORAL_TABLET | Freq: Two times a day (BID) | ORAL | Status: DC

## 2013-03-24 NOTE — Telephone Encounter (Signed)
I just refilled Eliquis. Have not gotten a prior auth for this.

## 2013-03-24 NOTE — Telephone Encounter (Signed)
Will refill Eliquis now and [name] is working on this and will notify me about what else I need to do.

## 2013-03-24 NOTE — Telephone Encounter (Signed)
[facility] does not need prior authorization and one refill authorized on both eliquis and diltiazam - future refills to be requested from cardiologist. Pt contacted as well. Tildon Husky, RN-BSN

## 2013-03-24 NOTE — Telephone Encounter (Signed)
All done - one refill authorized and will get future refills from cardiologist - pt called and informed!! Tildon Husky, RN-BSN

## 2013-03-24 NOTE — Telephone Encounter (Signed)
Will forward to [place], [place]

## 2013-03-24 NOTE — Telephone Encounter (Signed)
Patient is wanting to know what the status of her approval for medication at [facility]. The medication is Eliquis and Diltiazem. JW

## 2013-03-25 NOTE — Telephone Encounter (Signed)
As far as yesterday's phone call she should be okay for eliquis and diltazium and get future refills from cardiologist. I spoke with pharmacy and pt yesterday. NO further issues that I have been made aware of. Tildon Husky, RN-BSN

## 2013-03-25 NOTE — Telephone Encounter (Signed)
So as far as I can tell from our discussion at [facility], she is all set for her eliquis. [name], let me know if you know differently. I do not want her out of this medication.

## 2013-04-09 ENCOUNTER — Ambulatory Visit (INDEPENDENT_AMBULATORY_CARE_PROVIDER_SITE_OTHER): Payer: Medicare Other | Admitting: Family Medicine

## 2013-04-09 ENCOUNTER — Encounter: Payer: Self-pay | Admitting: Family Medicine

## 2013-04-09 VITALS — BP 148/98 | HR 63 | Temp 97.8°F

## 2013-04-09 DIAGNOSIS — E119 Type 2 diabetes mellitus without complications: Secondary | ICD-10-CM

## 2013-04-09 DIAGNOSIS — IMO0001 Reserved for inherently not codable concepts without codable children: Secondary | ICD-10-CM

## 2013-04-09 DIAGNOSIS — I4891 Unspecified atrial fibrillation: Secondary | ICD-10-CM

## 2013-04-09 DIAGNOSIS — I10 Essential (primary) hypertension: Secondary | ICD-10-CM

## 2013-04-09 DIAGNOSIS — S82891G Other fracture of right lower leg, subsequent encounter for closed fracture with delayed healing: Secondary | ICD-10-CM

## 2013-04-09 LAB — BASIC METABOLIC PANEL
Chloride: 110 mEq/L (ref 96–112)
Potassium: 4.7 mEq/L (ref 3.5–5.3)

## 2013-04-09 LAB — POCT GLYCOSYLATED HEMOGLOBIN (HGB A1C): Hemoglobin A1C: 7.1

## 2013-04-09 NOTE — Patient Instructions (Addendum)
It was great to see you again!  For your right leg,  - Ask the [place] office to give me records. - Check your legs and feet daily for any issues. - Continue antibiotics and seek immediate care if you have fevers/chills or signs of an infection.  For your blood pressure, - I will look into the chlorthalidone. - Keep taking meds daily and checking BPs. Bring me a report of your blood pressures.  We will check vitamin D today and I will refill if you need to keep taking it.  For your Diabetes, - I will let you know what to do with meds based on your A1c today.  Please also get your eye doctor to send me records. We will try to do a retinal scan here today too.  We are checking some labs today, and I will call you if they are abnormal. If you do not hear from me with a call or letter in 2 weeks, please call us as I may have been unable to reach you.  As you leave, make an appointment to follow up with me whenever convenient for you to do a health history, preferably after your next cardiology visit.  - Bring all medications in a bag to your visits. - Bring a log of blood pressures and sugars.   Take care and seek immediate care if you have concerns.

## 2013-04-09 NOTE — Progress Notes (Signed)
Patient ID: KITZIA HONEGGER, female   DOB: Dec 23, 1955, 57 y.o.   MRN: [id number] Subjective:   CC: Follow-up right leg, DM, and HTN  HPI:  1. Leg - Pt recently had infection of hardware in right leg, placed after fracture, and had 2nd surgery to remove hardware. Per pt, got second opinion in [place] and had 3rd surgery to re-place hardware. She is on minocycline 100mg  BID x 3-4 months per pt, started 2 weeks ago. Denies medication side effects, fevers, chills, oozing, erythema, or other concerns. July 22nd she says she has f/u appt in [place]. Due to severe diabetic neuropathy, feels little to no pain in right leg. Has nurse home visit 3 days weekly for leg.   2. DM with severe neuropathy - Takes novolin R and lantus daily. Checks BG at home - 154 this AM. Usually 106-130s fasting. Lows x 2 to 60 where she felt symptomatic (sweating, feeling queasy, itchy), will take snack and improves.  3. HTN: BP at home is usually 140s/60, same today. Took only one of her BP medications this morning. Denies headache, dizziness, syncope, blurred vision more than normal, or medication SEs.   4. Atrial fibrillation - Has cardiologist appt Aug 1; Denies chest pain or dyspnea or palpitations. Was able to get eliquis and diltiazem.   Review of Systems - Per HPI.   SH: - Denies tobacco, drugs, or etoh use    Objective:  Physical Exam BP 148/98  Pulse 63  Temp(Src) 97.8 F (36.6 C) (Oral) GEN: NAD, pleasant EXTR: Right leg with metal hardware externally in place, no erythema or exudate, pt is in a wheelchair. PULM: Normal effort NEURO: Awake, alert, no focal deficits, normal speech PSYCH: Mood and affect euthymic, normal rate and volume of speech SKIN: No rash or cyanosis    Assessment:     STALEY ZIMAN is a 57 y.o. female with h/o right leg surgery, DM, and HTN here for f/u of all three.    Plan:     # See problem list for problem-specific plans. - At next appt, get new patient  information and f/u on health maintenance issues (Pap, TDAP, colonoscopy, mammogram)

## 2013-04-10 LAB — VITAMIN D 25 HYDROXY (VIT D DEFICIENCY, FRACTURES): Vit D, 25-Hydroxy: 26 ng/mL — ABNORMAL LOW (ref 30–89)

## 2013-04-11 MED ORDER — VITAMIN D (ERGOCALCIFEROL) 1.25 MG (50000 UNIT) PO CAPS: 50000.0000 [IU] | ORAL_CAPSULE | ORAL | Status: DC

## 2013-04-11 NOTE — Assessment & Plan Note (Signed)
Stable with no current symptoms, on eliquis and cardizem. HR 63 today. Was able to get eliquis and cardizem. - Has cardiology f/u next month.

## 2013-04-11 NOTE — Assessment & Plan Note (Addendum)
Checking A1c today. Hesitate to add insulin given occasional hypoglycemic symptoms. - Recommended daily foot check - ROI for ophthalmologist Dr. Rodena Piety records for eye exam. - Snack on hand at all times. - Bring log of glucose to next visit.

## 2013-04-11 NOTE — Assessment & Plan Note (Addendum)
Stable, afebrile, s/p 3 infections after hardware infection and 2nd opinion in [place], now with hardware put back into leg. - Continue minocycline - Checking vitamin D level today and will recommend d/c or continue vitamin D supplementation based on this **26 from 17, still low, continue current dosing. Rx sent and will have staff call pt** Recheck in 8 weeks - ROI signed for records from [place] - Return precautions reviewed

## 2013-04-11 NOTE — Assessment & Plan Note (Signed)
Elevated today, pt states she did not take all meds this morning - Checking BMET - Log BPs at home and bring this to next visit to help decide on any antihtn change. - Continue diltiazem, hydralazine, coreg, imdur. - Chlorthalidone not listed but pt still taking - clarify before f/u

## 2013-04-24 ENCOUNTER — Encounter: Payer: Self-pay | Admitting: Cardiology

## 2013-04-24 ENCOUNTER — Ambulatory Visit (INDEPENDENT_AMBULATORY_CARE_PROVIDER_SITE_OTHER): Payer: Medicaid Other | Admitting: Cardiology

## 2013-04-24 VITALS — BP 128/66 | HR 55 | Ht 67.0 in | Wt 230.0 lb

## 2013-04-24 DIAGNOSIS — E785 Hyperlipidemia, unspecified: Secondary | ICD-10-CM | POA: Insufficient documentation

## 2013-04-24 DIAGNOSIS — I4891 Unspecified atrial fibrillation: Secondary | ICD-10-CM

## 2013-04-24 DIAGNOSIS — I2510 Atherosclerotic heart disease of native coronary artery without angina pectoris: Secondary | ICD-10-CM

## 2013-04-24 DIAGNOSIS — N183 Chronic kidney disease, stage 3 unspecified: Secondary | ICD-10-CM

## 2013-04-24 DIAGNOSIS — I10 Essential (primary) hypertension: Secondary | ICD-10-CM

## 2013-04-24 MED ORDER — APIXABAN 5 MG PO TABS: 5.0000 mg | ORAL_TABLET | Freq: Two times a day (BID) | ORAL | Status: DC

## 2013-04-24 NOTE — Assessment & Plan Note (Signed)
Continue present medications. Check lipids and liver.

## 2013-04-24 NOTE — Assessment & Plan Note (Signed)
Continue statin. Not on aspirin given need for anticoagulation. 

## 2013-04-24 NOTE — Assessment & Plan Note (Signed)
Patient remains in sinus rhythm. Continue Cardizem, Coreg and apixaban. Multiple embolic risk factors.

## 2013-04-24 NOTE — Assessment & Plan Note (Signed)
Blood pressure controlled. Continue present medications. 

## 2013-04-24 NOTE — Assessment & Plan Note (Addendum)
Patient has chronic renal insufficiency. I will ask nephrology to follow.

## 2013-04-24 NOTE — Progress Notes (Signed)
HPI: pleasant female for followup of coronary artery disease and atrial fibrillation. Patient was admitted in April of 2014 following a syncopal episode. She fractured her fibula which required surgical intervention. She was noted to be in atrial fibrillation with a rapid ventricular response at the time of admission. She converted spontaneously to sinus rhythm. She also ruled in for a non-ST elevation myocardial infarction. Cardiac catheterization in April of 2014 revealed normal left main, 40% mid LAD and 30% distal. 80% proximal circumflex and 30% right coronary artery. It was felt medical therapy indicated unless she had recurrent symptoms of chest pain and PCI could be considered at that time. Echocardiogram in April of 2014 showed normal LV function, mild left ventricular hypertrophy and mild left atrial enlargement. Carotid Dopplers in April of 2014 showed no significant stenosis. Since she was discharged, there is no chest pain, dyspnea or syncope. She has limited mobility as she has required surgery on her right leg.  Current Outpatient Prescriptions  Medication Sig Dispense Refill  . apixaban (ELIQUIS) 5 MG TABS tablet Take 1 tablet (5 mg total) by mouth 2 (two) times daily.  60 tablet  2  . aspirin EC 81 MG tablet Take 81 mg by mouth daily.      Marland Kitchen atorvastatin (LIPITOR) 80 MG tablet Take 1 tablet (80 mg total) by mouth at bedtime.  30 tablet  11  . carvedilol (COREG) 12.5 MG tablet Take 1 tablet (12.5 mg total) by mouth 2 (two) times daily with a meal.  60 tablet  3  . diltiazem (CARDIZEM CD) 240 MG 24 hr capsule Take 1 capsule (240 mg total) by mouth daily.  30 capsule  3  . hydrALAZINE (APRESOLINE) 25 MG tablet Take 1 tablet (25 mg total) by mouth every 8 (eight) hours.  90 tablet  3  . insulin glargine (LANTUS) 100 UNIT/ML injection Inject 20 Units into the skin at bedtime.      . insulin regular (NOVOLIN R,HUMULIN R) 100 units/mL injection Inject 10-20 Units into the skin 3 (three)  times daily before meals. Sliding scale of insulin dosage      . isosorbide mononitrate (IMDUR) 30 MG 24 hr tablet Take 30 mg by mouth daily.      . minocycline (MINOCIN,DYNACIN) 100 MG capsule Take 100 mg by mouth 2 (two) times daily. Per Dr. Oscar La, [place] in [place]. Per patient, 3-4 months.      . Multiple Vitamin (MULTIVITAMIN WITH MINERALS) TABS Take 1 tablet by mouth daily.      . nitroGLYCERIN (NITROSTAT) 0.4 MG SL tablet Place 1 tablet (0.4 mg total) under the tongue every 5 (five) minutes as needed for chest pain.  30 tablet  0  . traMADol (ULTRAM) 50 MG tablet Take 1 tablet (50 mg total) by mouth every 6 (six) hours as needed for pain. Maximum dose= 8 tablets per day  60 tablet  0  . Vitamin D, Ergocalciferol, (DRISDOL) 50000 UNITS CAPS Take 1 capsule (50,000 Units total) by mouth every Wednesday.  7 capsule  0   No current facility-administered medications for this visit.     Past Medical History  Diagnosis Date  . Hypertension   . Diabetes mellitus   . Myocardial infarction     in April 2014  . Chronic kidney disease, stage III (moderate)   . Constipation   . Anemia   . History of blood transfusion   . Atrial fibrillation   . CAD (coronary artery disease)  Past Surgical History  Procedure Laterality Date  . Laser surgery for diabetic retinopathy    . Abdominal hysterectomy    . Orif ankle fracture Right 01/12/2013    Procedure: OPEN REDUCTION INTERNAL FIXATION (ORIF) ANKLE FRACTURE;  Surgeon: Newt Minion, MD;  Location: [facility];  Service: Orthopedics;  Laterality: Right;  . Eye surgery    . Hardware removal Right 02/13/2013    Procedure: HARDWARE REMOVAL;  Surgeon: Newt Minion, MD;  Location: [facility];  Service: Orthopedics;  Laterality: Right;  Right Ankle Removal Hardware, Debridement, Place Wound VAC    History   Social History  . Marital Status: Legally Separated    Spouse Name: N/A    Number of Children: N/A  . Years of Education: N/A    Occupational History  . Disabled    Social History Main Topics  . Smoking status: Never Smoker   . Smokeless tobacco: Not on file  . Alcohol Use: No  . Drug Use: No  . Sexually Active: Not on file   Other Topics Concern  . Not on file   Social History Narrative   Patient lives with her son..    ROS: no fevers or chills, productive cough, hemoptysis, dysphasia, odynophagia, melena, hematochezia, dysuria, hematuria, rash, seizure activity, orthopnea, PND, pedal edema, claudication. Remaining systems are negative.  Physical Exam: Well-developed well-nourished in no acute distress.  Skin is warm and dry.  HEENT is normal.  Neck is supple.  Chest is clear to auscultation with normal expansion.  Cardiovascular exam is regular rate and rhythm. 2/6 systolic murmur left sternal border. Abdominal exam nontender or distended. No masses palpated. Extremities orthopedic device in place on right. 1+ edema. neuro grossly intact  ECG sinus bradycardia at a rate of 55. No ST changes.

## 2013-04-24 NOTE — Patient Instructions (Signed)
Your physician wants you to follow-up in: [place] will receive a reminder letter in the mail two months in advance. If you don't receive a letter, please call our office to schedule the follow-up appointment.   STOP ASPIRIN  REFERRAL TO NEPHROLOGY FOR ELEVATED CREATINE  Your physician recommends that you return for lab work WHEN FASTING

## 2013-04-27 ENCOUNTER — Telehealth: Payer: Self-pay | Admitting: Family Medicine

## 2013-04-27 NOTE — Telephone Encounter (Signed)
Please call and let patient know: - For her diabetes management, I want her to f/u with me soon with a blood sugar log. I want her to log 3 blood sugar readings at each time (Fasting and 2 hours after each meal, and clearly list which is which) so we can figure out if there is a particular time of day when her glucose is getting low.  - Also, her kidney function is still not ideal. I saw that cardiology had ordered a renal referral. Has renal contacted her to set up an appointment? I eventually will want to revisit starting an ACE or ARB (lisinopril-allergic).    Hilton Sinclair, MD

## 2013-04-27 NOTE — Telephone Encounter (Signed)
Pt notified of CBG schedule per Md.  Pt verbalized understanding.  Cardiology is in the process of making referral to nephrology for patient per patient.   F/u appt with PCP scheduled 05/21/2013.  Pt instructed to bring CBG logs.   Mckynlie Vanderslice, [name], [place]

## 2013-05-02 ENCOUNTER — Other Ambulatory Visit: Payer: Self-pay | Admitting: Family Medicine

## 2013-05-06 ENCOUNTER — Telehealth: Payer: Self-pay | Admitting: Cardiology

## 2013-05-06 NOTE — Telephone Encounter (Signed)
WILL SEND TO DR/NURSE

## 2013-05-06 NOTE — Telephone Encounter (Signed)
Got fax today from [place]. P.A's Miss Maryln Gottron. Stating pt has been rated a 3 this means a 6 to 8 week wait.  Patient is aware of time frame.

## 2013-05-06 NOTE — Telephone Encounter (Signed)
6-8 weeks appt is fine.

## 2013-05-13 ENCOUNTER — Other Ambulatory Visit: Payer: Medicaid Other

## 2013-05-13 ENCOUNTER — Telehealth: Payer: Self-pay | Admitting: *Deleted

## 2013-05-13 NOTE — Telephone Encounter (Signed)
[facility] in [place] calling for NPI #.  Patient had post op appt for ankle fracture and infection.  NPI # given.  Nolene Ebbs, RN

## 2013-05-21 ENCOUNTER — Ambulatory Visit: Payer: Medicaid Other | Admitting: Family Medicine

## 2013-05-26 ENCOUNTER — Ambulatory Visit (INDEPENDENT_AMBULATORY_CARE_PROVIDER_SITE_OTHER): Payer: Medicaid Other | Admitting: Family Medicine

## 2013-05-26 ENCOUNTER — Encounter: Payer: Self-pay | Admitting: Family Medicine

## 2013-05-26 VITALS — BP 129/64 | HR 57 | Temp 98.1°F | Ht 67.0 in

## 2013-05-26 DIAGNOSIS — J189 Pneumonia, unspecified organism: Secondary | ICD-10-CM

## 2013-05-26 DIAGNOSIS — E1140 Type 2 diabetes mellitus with diabetic neuropathy, unspecified: Secondary | ICD-10-CM

## 2013-05-26 DIAGNOSIS — R05 Cough: Secondary | ICD-10-CM | POA: Insufficient documentation

## 2013-05-26 DIAGNOSIS — S8290XS Unspecified fracture of unspecified lower leg, sequela: Secondary | ICD-10-CM

## 2013-05-26 DIAGNOSIS — S8291XA Unspecified fracture of right lower leg, initial encounter for closed fracture: Secondary | ICD-10-CM | POA: Insufficient documentation

## 2013-05-26 DIAGNOSIS — Z23 Encounter for immunization: Secondary | ICD-10-CM

## 2013-05-26 DIAGNOSIS — E1139 Type 2 diabetes mellitus with other diabetic ophthalmic complication: Secondary | ICD-10-CM

## 2013-05-26 DIAGNOSIS — E11319 Type 2 diabetes mellitus with unspecified diabetic retinopathy without macular edema: Secondary | ICD-10-CM

## 2013-05-26 DIAGNOSIS — E119 Type 2 diabetes mellitus without complications: Secondary | ICD-10-CM

## 2013-05-26 DIAGNOSIS — E1149 Type 2 diabetes mellitus with other diabetic neurological complication: Secondary | ICD-10-CM

## 2013-05-26 DIAGNOSIS — S8291XS Unspecified fracture of right lower leg, sequela: Secondary | ICD-10-CM

## 2013-05-26 DIAGNOSIS — R059 Cough, unspecified: Secondary | ICD-10-CM

## 2013-05-26 DIAGNOSIS — R053 Chronic cough: Secondary | ICD-10-CM | POA: Insufficient documentation

## 2013-05-26 DIAGNOSIS — E1142 Type 2 diabetes mellitus with diabetic polyneuropathy: Secondary | ICD-10-CM

## 2013-05-26 MED ORDER — "NEEDLE (DISP) 30G X 1/2"" MISC": Status: DC

## 2013-05-26 MED ORDER — INSULIN REGULAR HUMAN 100 UNIT/ML IJ SOLN: 10.0000 [IU] | Freq: Three times a day (TID) | INTRAMUSCULAR | Status: DC

## 2013-05-26 MED ORDER — LORATADINE 10 MG PO TABS: 10.0000 mg | ORAL_TABLET | Freq: Every day | ORAL | Status: DC

## 2013-05-26 MED ORDER — GLUCOSE BLOOD VI STRP: ORAL_STRIP | Status: DC

## 2013-05-26 MED ORDER — INSULIN SYRINGES (DISPOSABLE) U-100 0.5 ML MISC: Status: DC

## 2013-05-26 MED ORDER — INSULIN GLARGINE 100 UNIT/ML ~~LOC~~ SOLN: 20.0000 [IU] | Freq: Every day | SUBCUTANEOUS | Status: DC

## 2013-05-26 MED ORDER — RELION LANCETS ULTRA-THIN 30G MISC: Status: DC

## 2013-05-26 NOTE — Progress Notes (Signed)
Subjective:     Patient ID: [name], female   DOB: 02/18/1956, 57 y.o.   MRN: [id number]  HPI 57 yo F presents for f/u visit to discuss the following:  1. DM2: patient is compliant with insulin. She buys insulin and diabetic supplies over the counter (relion).  CBGs: no lows recently. Has history of lows down to 40 and syncope related to hypoglycemia.  Fasting CBGs 114-125 Postprandial CBGs 150.  ROS: Positive: tingling in hands and feet. Negative: CP, HA, vision changes, GI upset.  Vision: seen at [facility]. Dr. Zigmund Daniel. Seen q 6 months. Next appt October, 2014.   2. Recent hospitalization for pneumonia: 8/24-8/27 at [facility] in [place]. Treated with IV antibiotics. Also received 2 U PRBCs. D/cd on Levaquin x 7 day course. Went back the next day with cough and SOB. Treated with albuterol. Admits to persistent cough that is intermittently productive. Associated with SOB. No fever or CP.   3. Chronic cough: x 2 years. worsened since being sick. Ex-smoker, quit 40 years ago. Denies reflux, heartburn, bitter taste in her mouth.   Health maintenance: reviewed. Patient due for multiple things. pneumovax patient reported done during hospitalization. Flu shot and Tdap-done today.  Patient given handout for colonoscopy. encouraged to schedule mammogram.   Review of Systems As per HPI     Objective:   Physical Exam BP 129/64  Pulse 57  Temp(Src) 98.1 F (36.7 C) (Oral)  Ht 5\' 7"  (1.702 m) General appearance: alert, cooperative and no distress Eyes: conjunctivae/corneas clear. PERRL, EOM's intact.  Ears: normal TM and external ear canal right ear and abnormal external canal left ear - cerumen removed by irrigation Nose: Nares normal. Septum midline. Mucosa normal. No drainage or sinus tenderness., turbinates pink, swollen, no sinus tenderness, no polyps, no crusting or bleeding points Throat: lips, mucosa, and tongue normal; teeth and gums  normal Neck: no adenopathy, no carotid bruit, [name], supple, symmetrical, trachea midline and thyroid not enlarged, symmetric, no tenderness/mass/nodules Lungs: normal WOB, BS slightly coarse diffusely, no crackles or wheezing  Heart: regular rate and rhythm, S1, S2 normal, no murmur, click, rub or gallop Extremities:  R lower leg: external fixation device L lower leg: no edema or cyanosis Neurologic: Grossly normal     Assessment and Plan:

## 2013-05-26 NOTE — Addendum Note (Signed)
Addended by: Boykin Nearing on: 05/26/2013 04:24 PM   Modules accepted: Orders

## 2013-05-26 NOTE — Assessment & Plan Note (Addendum)
A: completed Levaquin. asymptomatic now except for cough.  P; Continue albuterol prn. Loratadine for chronic cough.  Had patient sign ROI to get records from [facility].

## 2013-05-26 NOTE — Assessment & Plan Note (Signed)
Patient to look up nortriptyline and consider this for treatment in the future. She is reluctant to try lyrica or neurontin due to side effects.

## 2013-05-26 NOTE — Patient Instructions (Addendum)
Ms. [name],  Thank you for coming in today. We will get records for Dr. Dianah Field (Dr. Darene Lamer) to review from your recent hospital stay.  Regarding diabetes, I have seen insulin prescription to your pharmacy.  I will order supplies as well, this needs to be handwritten by me and faxed to your pharmacy.   Look up nortriptyline (pamelor) for possible treatment of neuropathy.   For your cough, I suspect that there may be an element of allergies. Try daily loratadine.   F/u with Dr. Verdis Frederickson T. I recommend within the next month to discuss your chronic cough as possible referral.   Dr. Adrian Blackwater

## 2013-05-26 NOTE — Assessment & Plan Note (Signed)
A: ? Allergic cough. Does not sound like reflux. Cough worsened post PNA.  P: Daily antihistamine. claritin prescribed.

## 2013-05-26 NOTE — Assessment & Plan Note (Signed)
A: well controlled based on recent CBGs. No low blood sugars. P: Sent in rx for insulin Will hand write and fax rx for diabetic supplies. patient for f/u in one month, repeat A1c, foot exam, urine microalbumin at f/u.

## 2013-06-03 ENCOUNTER — Telehealth: Payer: Self-pay | Admitting: Cardiology

## 2013-06-03 NOTE — Telephone Encounter (Deleted)
Error

## 2013-06-05 ENCOUNTER — Other Ambulatory Visit: Payer: Self-pay | Admitting: Nephrology

## 2013-06-05 DIAGNOSIS — N289 Disorder of kidney and ureter, unspecified: Secondary | ICD-10-CM

## 2013-06-09 ENCOUNTER — Ambulatory Visit
Admission: RE | Admit: 2013-06-09 | Discharge: 2013-06-09 | Disposition: A | Discharge status: Home / Self Care | Payer: Medicaid Other | Source: Ambulatory Visit | Attending: Nephrology | Admitting: Nephrology

## 2013-06-09 DIAGNOSIS — N289 Disorder of kidney and ureter, unspecified: Secondary | ICD-10-CM

## 2013-06-11 ENCOUNTER — Telehealth: Payer: Self-pay | Admitting: *Deleted

## 2013-06-11 ENCOUNTER — Encounter: Payer: Self-pay | Admitting: Family Medicine

## 2013-06-11 NOTE — Telephone Encounter (Signed)
Called [employer] to confirm approval of eliquis due to receiving  prior approval form, spoke with [employer], medication approved 06/03/2013-06/03/2014, reference number [id number]

## 2013-06-12 ENCOUNTER — Other Ambulatory Visit: Payer: Self-pay | Admitting: Family Medicine

## 2013-06-16 ENCOUNTER — Other Ambulatory Visit (HOSPITAL_COMMUNITY): Payer: Self-pay | Admitting: *Deleted

## 2013-06-17 ENCOUNTER — Encounter (HOSPITAL_COMMUNITY): Payer: Medicaid Other

## 2013-06-22 ENCOUNTER — Other Ambulatory Visit: Payer: Self-pay | Admitting: Family Medicine

## 2013-06-22 MED ORDER — ISOSORBIDE MONONITRATE ER 30 MG PO TB24: 30.0000 mg | ORAL_TABLET | Freq: Every day | ORAL | Status: DC

## 2013-06-22 MED ORDER — DILTIAZEM HCL ER COATED BEADS 240 MG PO CP24: 240.0000 mg | ORAL_CAPSULE | Freq: Every day | ORAL | Status: DC

## 2013-06-23 ENCOUNTER — Other Ambulatory Visit: Payer: Self-pay

## 2013-06-23 ENCOUNTER — Encounter: Payer: Self-pay | Admitting: Family Medicine

## 2013-06-23 ENCOUNTER — Other Ambulatory Visit: Payer: Self-pay | Admitting: Family Medicine

## 2013-06-23 DIAGNOSIS — E119 Type 2 diabetes mellitus without complications: Secondary | ICD-10-CM

## 2013-06-23 MED ORDER — DILTIAZEM HCL ER COATED BEADS 240 MG PO TB24: 240.0000 mg | ORAL_TABLET | Freq: Every day | ORAL | Status: DC

## 2013-06-23 MED ORDER — GLUCOSE BLOOD VI STRP: ORAL_STRIP | Status: DC

## 2013-06-23 MED ORDER — CARVEDILOL 12.5 MG PO TABS: 12.5000 mg | ORAL_TABLET | Freq: Two times a day (BID) | ORAL | Status: DC

## 2013-06-23 NOTE — Telephone Encounter (Signed)
Called to let patient know about the following medication change - Received prior auth form for Diltiazem ER which is not covered by patient's insurance.   Spoke with cardiologist Dr. Stanford Breed who approved Cardizem LA which is on preferred list, at same dose as previous Diltiazem ER. Rx'ed cardizem LA 240mg  daily and sent to pt preferred pharmacy (requested sending to [facility] on [address])  Pt also brings up needing test strips - medicaid does not cover. Sent new rx for test strips and pt will call if this is also not covered.  Pt voiced understanding of med change.   Connie Sinclair, MD 06/23/2013 1:42 PM

## 2013-07-08 ENCOUNTER — Ambulatory Visit (INDEPENDENT_AMBULATORY_CARE_PROVIDER_SITE_OTHER): Payer: Self-pay | Admitting: Ophthalmology

## 2013-07-23 ENCOUNTER — Other Ambulatory Visit: Payer: Self-pay | Admitting: Family Medicine

## 2013-07-23 DIAGNOSIS — N184 Chronic kidney disease, stage 4 (severe): Secondary | ICD-10-CM

## 2013-07-23 NOTE — Telephone Encounter (Signed)
Changing doseage listed for medications based on recent visit with Dr. Mercy Moore (nephrology). - Diltiazem CD increased to 300mg /day, given rx #30with 6 refills - Irbesartan 150mg  daily added, given rx #30 with 6 refills - Lasix 40mg  daily added, given rx #30 with 6 refills - Decreased hydralazine to 25mg  BID to make dosing simpler    Hilton Sinclair, MD 07/23/2013 6:43 PM

## 2013-09-02 ENCOUNTER — Ambulatory Visit (INDEPENDENT_AMBULATORY_CARE_PROVIDER_SITE_OTHER): Payer: Medicare Other | Admitting: Family Medicine

## 2013-09-02 ENCOUNTER — Encounter: Payer: Self-pay | Admitting: Family Medicine

## 2013-09-02 VITALS — BP 161/99 | HR 94 | Temp 97.4°F | Wt 223.0 lb

## 2013-09-02 DIAGNOSIS — E559 Vitamin D deficiency, unspecified: Secondary | ICD-10-CM

## 2013-09-02 DIAGNOSIS — E1139 Type 2 diabetes mellitus with other diabetic ophthalmic complication: Secondary | ICD-10-CM

## 2013-09-02 DIAGNOSIS — E11319 Type 2 diabetes mellitus with unspecified diabetic retinopathy without macular edema: Secondary | ICD-10-CM

## 2013-09-02 DIAGNOSIS — I10 Essential (primary) hypertension: Secondary | ICD-10-CM

## 2013-09-02 DIAGNOSIS — E119 Type 2 diabetes mellitus without complications: Secondary | ICD-10-CM

## 2013-09-02 DIAGNOSIS — R053 Chronic cough: Secondary | ICD-10-CM

## 2013-09-02 DIAGNOSIS — R05 Cough: Secondary | ICD-10-CM

## 2013-09-02 DIAGNOSIS — R059 Cough, unspecified: Secondary | ICD-10-CM

## 2013-09-02 MED ORDER — OMEPRAZOLE 40 MG PO CPDR: 40.0000 mg | DELAYED_RELEASE_CAPSULE | Freq: Every day | ORAL | Status: DC

## 2013-09-02 MED ORDER — IRBESARTAN 150 MG PO TABS: 150.0000 mg | ORAL_TABLET | Freq: Every day | ORAL | Status: DC

## 2013-09-02 MED ORDER — "NEEDLE (DISP) 30G X 1/2"" MISC": Status: DC

## 2013-09-02 NOTE — Patient Instructions (Signed)
Good to see you.  For your diabetes - We are checking an A1c today. I will call you about med changes.  For your blood pressure - You already have an order for irbesartan. Try this medication (call me if it is not at your pharmacy).  - Call me if you have side effects and have to stop taking this. - We are checking your urine today for protein.  For your cough - Take omeprazole daily. - Get a chest xray.  Come back for evaluation of your cough as soon as you can. Seek immediate care if you have trouble breathing. Come back when convenient for a well woman exam.   Hilton Sinclair, MD

## 2013-09-02 NOTE — Progress Notes (Signed)
Patient ID: [name], female   DOB: 02-Sep-1956, 57 y.o.   MRN: [id number] Subjective:   CC: Follow up DM  HPI:   1. F/u DM Type II  Patient is compliant with lantus 20 units QHS and regular insulin TID before meals. She has decreased from 10-20 units TIDqAC to 3-5 units. She buys diabetic supplies.  CBGs: Has had lows into 40s and feels anxious at these times (2-3 times monthly). Has had highs into 190 but only 1-2 times in the last month.  ROS -  Positive: Numbness feet, tingling and some numbness hands.  Negative: CP, HA, vision changes, GI upset. However, did have these x 1 about 3 weeks ago that she attributes to losartan. Resolved after stopping lostartan. Vision: Seen at [facility] ~6 months ago. Does not know how often to be seen and does not have an appt.  Last lipid check 12/2012.  2. HTN - Takes coreg, diltizem LA, lasix, hydralazine, imdur. Was supposed to be taking losartan per her nephrologist [name], but stopped 2.5 weeks ago due to side effects (Chest pain, Headache, vision changes, GI upset). About 1 week later, these symptoms resolved. She would like to take an antihypertensive. Missed appt yesterday with [facility].  3. Low vitamin D - Pt had stopped taking Vitamin D but was supposed to restart this. She may not have gotten the message from our clinic staff. Does not report any complaints.  Review of Systems - Per HPI. Also, continued chronic cough, worse when laying flat, worse with eating, mild chronic LE edema. Does not report fever. Denies weight gain. Occasionally coughs up thick yellow mucus. Denies sour taste in morning or burning in chest/neck.  New patient info (unable to obtain at previous visits):   PMH: Reviewed  Meds: Reviewed. Has changed insulin from 10-20 units before meals to 3-5 on her own due to not needing as much. Minocycline completed - removed from list. Claritin removed from list as pt is not  taking.  Allergies reviewed.  Surgieries: 4 surgeries on right foot (2 here, 2 in [place]) Laser surgery eyes (removed vitreous humor, attached retinae per pt, at her [facility] ophthalmologist) Hysterectomy (partial - left ovaries, thinks took cervix - in [facility] for prolapse and intestinal adhesions, took out appendix)  FH: Mother cancer - colon to liver Father - died of massive heart attack Mother - died of hypoglycemic heart attack DM, HTN  SH: Lives with 2 sons Nonsmoker since 19 years old, only smoked for <1 year. No etoh or drug use (40 years ago used alcohol on weekends) Not sexually active Recently qualified for medicare  Health maintenance - reviewed.  Colonoscopy: never had Mammogram: "a few years ago" - cyst A1c: checking today Eye exam: has ophthalmologist - [facility] [name]. Seen ~02/2013. Unsure when next needed. Foot exam:  BP: Elevated today. STD check: Declines Pap smears: "Partial hysterectomy due to prolapse." Last pap "Years ago".  Lipids: 12/2012 Depression screen:  Falls risk assessment: TDAP, Zostavax, pneumococcal, influenza: - TDAP 05/26/13 - Flu shot 05/26/13     Objective:  Physical Exam BP 161/99  Pulse 94  Temp(Src) 97.4 F (36.3 C) (Oral)  Wt 223 lb (101.152 kg) GEN: NAD HEENT: Atraumatic, normocephalic, neck supple, EOMI, sclera clear  CV: RRR PULM: LLL crackles, normal effort SKIN: No rash or cyanosis; warm and well-perfused EXTR: Right lower extremity in CAM boot, nontender PSYCH: Mood and affect euthymic, normal rate and  volume of speech NEURO: Awake, alert, no focal deficits grossly, normal speech    Assessment:     [name] is a 57 y.o. female with h/o paroxysmal atrial fibrillation and leg fracture, DM, and HTN here for DM follow up and new patient intake (which we have not had opportunity to do previously).  Plan:     # See problem list and after visit summary for problem-specific  plans. - Of note: We have been trying to obtain records from [place]. Will need to see if these were received by office.  # Health Maintenance: Not done today.  Follow-up: - llow up at next available appointment for evaluation of chronic cough. - Follow up when you are convenient for well visit.   Connie Sinclair, MD [place]

## 2013-09-03 ENCOUNTER — Other Ambulatory Visit: Payer: Self-pay

## 2013-09-03 DIAGNOSIS — Z1231 Encounter for screening mammogram for malignant neoplasm of breast: Secondary | ICD-10-CM

## 2013-09-03 DIAGNOSIS — E559 Vitamin D deficiency, unspecified: Secondary | ICD-10-CM | POA: Insufficient documentation

## 2013-09-03 LAB — PROTEIN / CREATININE RATIO, URINE: Creatinine, Urine: 107.7 mg/dL

## 2013-09-03 NOTE — Assessment & Plan Note (Signed)
Still low at last check. Pt has not been taking. - Encouraged to restart - Check level in 8 weeks.

## 2013-09-03 NOTE — Assessment & Plan Note (Signed)
Chronic, mild leg swelling, h/o pneumonia LLL and worse with food. Crackles LLL on exam. Afebrile. Weight 8 lbs lower than 04/2013. Unlikely pneumonia as pt is afebrile. No wheezes. - With orthopnea, concern for heart failure though weight is not elevated. - Will obtain chest xray today to eval for consolidation/pulmonary edema. - Consider GERD given relationship with food. Will try omeprazole. - Return at next available appt for evaluation. - Pt likely needs PFTs.

## 2013-09-03 NOTE — Assessment & Plan Note (Signed)
Stable. - Pt to call Ophthalmologist and find out when next needs to be seen.

## 2013-09-03 NOTE — Assessment & Plan Note (Signed)
Elevated today, pt has not been taking losartan due to side effect of blurred vision and abdominal discomfort. - Try irbesartan. - Remake appt with [facility] that you missed. - Call if has side effects and we can try a different medication. - Check urine protein: creatinine today

## 2013-09-03 NOTE — Assessment & Plan Note (Signed)
Stable. Repeated A1c 6.5. - With occasional hypoglycemic episodes, will recommend to patient to decrease lantus to 15 u QHS and keep fasting BG log and log when low (times) and bring to f/u. - Checking urine protein:creatinine ratio today. - Repeat foot exam at f/u. - Refilled relion syringes at 31 gauge. - Pt to call ophthalmologist Dr Zigmund Daniel to see when she next needs to be seen.

## 2013-09-04 ENCOUNTER — Telehealth: Payer: Self-pay | Admitting: *Deleted

## 2013-09-04 DIAGNOSIS — I10 Essential (primary) hypertension: Secondary | ICD-10-CM

## 2013-09-04 MED ORDER — VALSARTAN 80 MG PO TABS: 80.0000 mg | ORAL_TABLET | Freq: Every day | ORAL | Status: DC

## 2013-09-04 NOTE — Telephone Encounter (Signed)
Prior authorization form for Irbesartan (along with medicaid preferred drug list) placed in MD box for completion.

## 2013-09-04 NOTE — Telephone Encounter (Signed)
Thank you Asha. Will instead try Diovan (valsartan) 80mg  daily which is on preferred list. Will hold on to Prior Auth incase for some reason this fails or gets refused.  Unable to leave voicemail for patient due to full mailbox. Please try calling her one more time and if that fails, send letter, stating the medication change and for her to check on this with her pharmacy.   Also ask her to decrease lantus to 15 units and keep fasting blood sugar log and lows log (with time of day) and bring to follow-up.  Thanks!   Hilton Sinclair, MD

## 2013-09-04 NOTE — Telephone Encounter (Signed)
Tried calling pt again and still unable to leave a VM.  Savayah Waltrip,CMA

## 2013-09-07 ENCOUNTER — Encounter: Payer: Self-pay | Admitting: Cardiology

## 2013-09-08 NOTE — Telephone Encounter (Signed)
Will await callback from patient. [name], [name]

## 2013-09-09 ENCOUNTER — Other Ambulatory Visit: Payer: Self-pay | Admitting: Family Medicine

## 2013-09-10 ENCOUNTER — Ambulatory Visit: Payer: Medicaid Other | Admitting: Family Medicine

## 2013-09-15 ENCOUNTER — Telehealth: Payer: Self-pay | Admitting: Family Medicine

## 2013-09-15 NOTE — Telephone Encounter (Signed)
Looks like you will still have to fill out a prior authorization form for Diovan even tho its preferred because it still requires a trial and failure of an ACE Inhibitor. Will put prior authorization form in your box. Please return to [place] or myself when complete.

## 2013-09-15 NOTE — Telephone Encounter (Addendum)
[employer] please help. I had prescribed valsartan (diovan) last time this issue came up, because it was on the preferred list Asha gave me. So I am uncertain why it is not going to be covered.  Connie Sinclair, MD

## 2013-09-15 NOTE — Telephone Encounter (Signed)
Insurance is not approving prescription for blood pressure. This was prescribed last Monday Could she prescibe something similar? Please advise

## 2013-09-22 NOTE — Telephone Encounter (Signed)
OK filled out prior auth and placed in Asha's box. Please let patient know process underway and I hope it gets authorized.  Hilton Sinclair, MD

## 2013-09-22 NOTE — Telephone Encounter (Signed)
Pt called to check the status of her medication. jw

## 2013-09-23 NOTE — Telephone Encounter (Signed)
Completed PA info in [place] for valsartan.  Status pending.  Will recheck status in 24 hours.  Nolene Ebbs, RN

## 2013-09-29 NOTE — Telephone Encounter (Signed)
Prior authorization for [place] denied.  Spoke with Medicaid--Patient has Medicare Part D.  All medications must be submitted to primary insurance first before trying to submit to [facility] for PA.  Spoke with pharmacy--patient's card has "expired."  Called patient and she states her Medicare was effective as of 09/24/13.  Has not received card yet.  Informed patient to contact Medicare for info and submit to pharmacy.  Patient verbalized understanding and agreeable to recommendation.  Nolene Ebbs, RN

## 2013-10-02 NOTE — Telephone Encounter (Signed)
Thank you :)

## 2013-10-06 ENCOUNTER — Other Ambulatory Visit: Payer: Self-pay | Admitting: Family Medicine

## 2013-10-07 ENCOUNTER — Telehealth: Payer: Self-pay | Admitting: Cardiology

## 2013-10-07 ENCOUNTER — Other Ambulatory Visit: Payer: Self-pay | Admitting: *Deleted

## 2013-10-07 ENCOUNTER — Ambulatory Visit: Payer: Medicaid Other

## 2013-10-07 DIAGNOSIS — I4891 Unspecified atrial fibrillation: Secondary | ICD-10-CM

## 2013-10-07 DIAGNOSIS — I2510 Atherosclerotic heart disease of native coronary artery without angina pectoris: Secondary | ICD-10-CM

## 2013-10-07 MED ORDER — APIXABAN 5 MG PO TABS: 5.0000 mg | ORAL_TABLET | Freq: Two times a day (BID) | ORAL | Status: DC

## 2013-10-07 NOTE — Telephone Encounter (Signed)
Pt called and needs a refill on her Eliquis sent to her pharmacy. jw

## 2013-10-20 ENCOUNTER — Telehealth: Payer: Self-pay | Admitting: Family Medicine

## 2013-10-20 ENCOUNTER — Telehealth: Payer: Self-pay | Admitting: *Deleted

## 2013-10-20 MED ORDER — ISOSORBIDE MONONITRATE ER 30 MG PO TB24: 30.0000 mg | ORAL_TABLET | Freq: Every day | ORAL | Status: DC

## 2013-10-20 NOTE — Telephone Encounter (Signed)
Imdur refill: Called patient to let her know I sent the prescription; I also reviewed with her that for calls, it can take up to 2 days to respond but luckily I checked my inbox at the end of my clinic day and could respond today.   Eliquis: Pt also stated she was contacted by [facility] stating they won't fill eliquis any more unless my office sends them something stating why she needs it. I have not received anything in my box regarding this and asked her to give me their number so I could look into it. She will call with this number.   Hilton Sinclair, MD

## 2013-10-20 NOTE — Telephone Encounter (Signed)
Initiation of PA to [facility] for eliquis

## 2013-10-20 NOTE — Telephone Encounter (Signed)
Pt is calling to check on the status of her refill request. jw

## 2013-10-20 NOTE — Telephone Encounter (Signed)
Would like to have isosorbide mononitrate refilled She wants it sent to [facility] on [address] in [place] Phone [phone] "This has to be filled today"

## 2013-10-27 ENCOUNTER — Telehealth: Payer: Self-pay | Admitting: Family Medicine

## 2013-10-27 DIAGNOSIS — I10 Essential (primary) hypertension: Secondary | ICD-10-CM

## 2013-10-27 MED ORDER — VALSARTAN 80 MG PO TABS: 80.0000 mg | ORAL_TABLET | Freq: Every day | ORAL | Status: DC

## 2013-10-27 MED ORDER — DILTIAZEM HCL ER COATED BEADS 240 MG PO TB24: 300.0000 mg | ORAL_TABLET | Freq: Every day | ORAL | Status: DC

## 2013-10-27 NOTE — Telephone Encounter (Signed)
Needs refills on cortizam and valspartan(sp) [facility] [address]  Needs to pick it up tomorrow

## 2013-10-27 NOTE — Telephone Encounter (Signed)
Refilled both valsartan and diltiazem.   Valsartan: I am assuming this refill means that insurance finally approved this. Diltiazem: Prior dose listed as 300mg  daily but tablet is in 240mg  dose. Will clarify with patient when she returns for next visit and fill as 240mg  for now.  Hilton Sinclair, MD

## 2013-10-28 ENCOUNTER — Telehealth: Payer: Self-pay | Admitting: Family Medicine

## 2013-10-28 ENCOUNTER — Ambulatory Visit: Payer: Medicaid Other

## 2013-10-28 DIAGNOSIS — I10 Essential (primary) hypertension: Secondary | ICD-10-CM

## 2013-10-28 NOTE — Telephone Encounter (Signed)
Pt needs her cardizem and diovan. Jazmin Hartsell,CMA

## 2013-10-28 NOTE — Telephone Encounter (Signed)
Pt called to say that she needs her two refills from 2/3 called into the [facility] on [address]. jw

## 2013-10-28 NOTE — Telephone Encounter (Signed)
Prior Authorization received from [place] for Diovan 80 mg. Formulary and PA form placed in provider box for completion. Derl Barrow, RN

## 2013-10-29 ENCOUNTER — Ambulatory Visit (INDEPENDENT_AMBULATORY_CARE_PROVIDER_SITE_OTHER): Payer: Commercial Managed Care - HMO | Admitting: Cardiology

## 2013-10-29 ENCOUNTER — Encounter: Payer: Self-pay | Admitting: Cardiology

## 2013-10-29 VITALS — BP 152/90 | HR 67 | Ht 67.0 in | Wt 210.0 lb

## 2013-10-29 DIAGNOSIS — I2510 Atherosclerotic heart disease of native coronary artery without angina pectoris: Secondary | ICD-10-CM

## 2013-10-29 DIAGNOSIS — I4891 Unspecified atrial fibrillation: Secondary | ICD-10-CM

## 2013-10-29 DIAGNOSIS — E785 Hyperlipidemia, unspecified: Secondary | ICD-10-CM | POA: Diagnosis not present

## 2013-10-29 DIAGNOSIS — N184 Chronic kidney disease, stage 4 (severe): Secondary | ICD-10-CM

## 2013-10-29 DIAGNOSIS — I10 Essential (primary) hypertension: Secondary | ICD-10-CM

## 2013-10-29 MED ORDER — DILTIAZEM HCL ER COATED BEADS 240 MG PO TB24: 300.0000 mg | ORAL_TABLET | Freq: Every day | ORAL | Status: DC

## 2013-10-29 MED ORDER — VALSARTAN 80 MG PO TABS: 80.0000 mg | ORAL_TABLET | Freq: Every day | ORAL | Status: DC

## 2013-10-29 NOTE — Progress Notes (Signed)
HPI: FU coronary artery disease and atrial fibrillation. Patient was admitted in April of 2014 following a syncopal episode. She fractured her fibula which required surgical intervention. She was noted to be in atrial fibrillation with a rapid ventricular response at the time of admission. She converted spontaneously to sinus rhythm. She also ruled in for a non-ST elevation myocardial infarction. Cardiac catheterization in April of 2014 revealed normal left main, 40% mid LAD and 30% distal. 80% proximal circumflex and 30% right coronary artery. It was felt medical therapy indicated unless she had recurrent symptoms of chest pain and PCI could be considered at that time. Echocardiogram in April of 2014 showed normal LV function, mild left ventricular hypertrophy and mild left atrial enlargement. Carotid Dopplers in April of 2014 showed no significant stenosis. Since she was last seen in August 2014, she denies dyspnea on exertion, orthopnea or PND. Mild pedal edema. She has noticed an occasional chest pain. It is described as a shooting pain lasting 1 second. No exertional chest pain.   Current Outpatient Prescriptions  Medication Sig Dispense Refill  . atorvastatin (LIPITOR) 80 MG tablet Take 1 tablet (80 mg total) by mouth at bedtime.  30 tablet  11  . carvedilol (COREG) 12.5 MG tablet Take 1 tablet (12.5 mg total) by mouth 2 (two) times daily with a meal.  60 tablet  6  . diltiazem (CARDIZEM LA) 240 MG 24 hr tablet Take 1 tablet (240 mg total) by mouth daily.  30 tablet  5  . ELIQUIS 5 MG TABS tablet TAKE ONE TABLET BY MOUTH TWICE DAILY  60 tablet  2  . furosemide (LASIX) 40 MG tablet Take 40 mg by mouth daily.      Marland Kitchen glucose blood (RELION GLUCOSE TEST STRIPS) test strip TID testing  100 each  11  . hydrALAZINE (APRESOLINE) 25 MG tablet TAKE ONE TABLET BY MOUTH twice daily      . insulin glargine (LANTUS) 100 UNIT/ML injection Inject 0.2 mLs (20 Units total) into the skin at bedtime.  10 mL   5  . insulin regular (NOVOLIN R,HUMULIN R) 100 units/mL injection Inject 0.1-0.2 mLs (10-20 Units total) into the skin 3 (three) times daily before meals. Sliding scale of insulin dosage  10 mL  5  . Insulin Syringes, Disposable, U-100 0.5 ML MISC 4x daily injections  100 each  8  . isosorbide mononitrate (IMDUR) 30 MG 24 hr tablet Take 1 tablet (30 mg total) by mouth daily.  30 tablet  5  . loratadine (CLARITIN) 10 MG tablet Take 1 tablet (10 mg total) by mouth daily.  30 tablet  2  . Multiple Vitamin (MULTIVITAMIN WITH MINERALS) TABS Take 1 tablet by mouth daily.      Marland Kitchen NEEDLE, DISP, 30 G (BD DISP NEEDLES) 30G X 1/2" MISC For 4x daily injections  100 each  13  . nitroGLYCERIN (NITROSTAT) 0.4 MG SL tablet Place 1 tablet (0.4 mg total) under the tongue every 5 (five) minutes as needed for chest pain.  30 tablet  0  . omeprazole (PRILOSEC) 40 MG capsule Take 1 capsule (40 mg total) by mouth daily. Take at dinnertime.  90 capsule  3  . ReliOn Ultra Thin Lancets MISC TID testing  100 each  0  . traMADol (ULTRAM) 50 MG tablet Take 1 tablet (50 mg total) by mouth every 6 (six) hours as needed for pain. Maximum dose= 8 tablets per day  60 tablet  0  .  valsartan (DIOVAN) 80 MG tablet Take 1 tablet (80 mg total) by mouth daily.  30 tablet  5  . Vitamin D, Ergocalciferol, (DRISDOL) 50000 UNITS CAPS Take 1 capsule (50,000 Units total) by mouth every Wednesday.  7 capsule  0   No current facility-administered medications for this visit.     Past Medical History  Diagnosis Date  . Hypertension   . Diabetes mellitus   . Myocardial infarction     in April 2014  . Chronic kidney disease, stage III (moderate)   . Constipation   . Anemia   . History of blood transfusion   . Atrial fibrillation   . CAD (coronary artery disease)     Past Surgical History  Procedure Laterality Date  . Laser surgery for diabetic retinopathy    . Abdominal hysterectomy    . Orif ankle fracture Right 01/12/2013     Procedure: OPEN REDUCTION INTERNAL FIXATION (ORIF) ANKLE FRACTURE;  Surgeon: Newt Minion, MD;  Location: [facility];  Service: Orthopedics;  Laterality: Right;  . Eye surgery    . Hardware removal Right 02/13/2013    Procedure: HARDWARE REMOVAL;  Surgeon: Newt Minion, MD;  Location: [facility];  Service: Orthopedics;  Laterality: Right;  Right Ankle Removal Hardware, Debridement, Place Wound VAC    History   Social History  . Marital Status: Legally Separated    Spouse Name: N/A    Number of Children: N/A  . Years of Education: N/A   Occupational History  . Disabled    Social History Main Topics  . Smoking status: Former Research scientist (life sciences)  . Smokeless tobacco: Not on file  . Alcohol Use: No  . Drug Use: No  . Sexual Activity: Not on file   Other Topics Concern  . Not on file   Social History Narrative   Patient lives with her son..    ROS: no fevers or chills, productive cough, hemoptysis, dysphasia, odynophagia, melena, hematochezia, dysuria, hematuria, rash, seizure activity, orthopnea, PND, pedal edema, claudication. Remaining systems are negative.  Physical Exam: Well-developed well-nourished in no acute distress.  Skin is warm and dry.  HEENT is normal.  Neck is supple.  Chest is clear to auscultation with normal expansion.  Cardiovascular exam is regular rate and rhythm.  Abdominal exam nontender or distended. No masses palpated. Extremities show trace edema. neuro grossly intact  ECG sinus rhythm at a rate of 67. No ST changes.

## 2013-10-29 NOTE — Assessment & Plan Note (Signed)
Continue statin. 

## 2013-10-29 NOTE — Assessment & Plan Note (Signed)
Blood pressure mildly elevated. However he follows this at home and it is typically controlled. Continue present medications and follow.

## 2013-10-29 NOTE — Assessment & Plan Note (Signed)
Followed by nephrology. 

## 2013-10-29 NOTE — Assessment & Plan Note (Signed)
Patient remains in sinus rhythm. Continue beta blocker and Cardizem. Continue apixaban.

## 2013-10-29 NOTE — Patient Instructions (Signed)
Your physician wants you to follow-up in: 6 MONTHS WITH DR CRENSHAW You will receive a reminder letter in the mail two months in advance. If you don't receive a letter, please call our office to schedule the follow-up appointment.  

## 2013-10-29 NOTE — Assessment & Plan Note (Signed)
Continue statin.not on aspirin given need for anticoagulation.

## 2013-10-29 NOTE — Telephone Encounter (Signed)
Sent these a few days ago but to [facility] in [place]. Will resend to [facility] on [address]. Completed PA end of Dec for Diovan (valsartan) and pt was told she was supposed to follow up on this. Tamika, to avoid double work, please let me know if there is anything else for me to do before filling out the prior auth a second time. See Jeannette's message in phone note 09/15/13.  Thanks. Hilton Sinclair, MD

## 2013-10-30 NOTE — Telephone Encounter (Signed)
Medicare Part D is requiring a prior authorization for the Diovan 80 mg tab.  Medicare is the pt's primary insurance.  The previous prior authorization was submitted though [place] [employer]-Med[employer], pt's secondary insurance.  Form in provider box for review.   Thanks.Derl Barrow, RN

## 2013-10-30 NOTE — Telephone Encounter (Signed)
Filled out and gave to [place]. Hilton Sinclair, MD

## 2013-11-03 ENCOUNTER — Telehealth: Payer: Self-pay | Admitting: *Deleted

## 2013-11-03 NOTE — Telephone Encounter (Signed)
[employer] faxed a cancelled PA for pt regarding the Diovan 80 mg tab.  Pt coverage with [employer] ended on 10/24/2013.  PA reference # [id number]. Derl Barrow, RN

## 2013-11-03 NOTE — Telephone Encounter (Signed)
Pt called regarding her Rx for Diovan and Eliquis.  Pt informed of the decision regarding the prior authorization.  Pt stated she has [employer] and will call [facility] on [address] to give them there policy number.  Eliquis is prescribed by another provider and pt needs to contact that office for medication refill.  Derl Barrow, RN

## 2013-11-05 NOTE — Telephone Encounter (Signed)
Thank you!  Hilton Sinclair, MD

## 2013-11-09 ENCOUNTER — Ambulatory Visit
Admission: RE | Admit: 2013-11-09 | Discharge: 2013-11-09 | Disposition: A | Discharge status: Home / Self Care | Payer: Medicaid Other | Source: Ambulatory Visit

## 2013-11-09 DIAGNOSIS — Z1231 Encounter for screening mammogram for malignant neoplasm of breast: Secondary | ICD-10-CM

## 2013-11-11 ENCOUNTER — Encounter: Payer: Self-pay | Admitting: Family Medicine

## 2013-11-11 DIAGNOSIS — Z0000 Encounter for general adult medical examination without abnormal findings: Secondary | ICD-10-CM | POA: Insufficient documentation

## 2013-11-12 ENCOUNTER — Telehealth: Payer: Self-pay | Admitting: *Deleted

## 2013-11-12 NOTE — Telephone Encounter (Signed)
Prior authorization form received from [place] for Matzim LA 240mg /24 tab.  Form placed in PCP box for review. Derl Barrow, RN

## 2013-11-13 ENCOUNTER — Other Ambulatory Visit: Payer: Self-pay | Admitting: Family Medicine

## 2013-11-13 DIAGNOSIS — E119 Type 2 diabetes mellitus without complications: Secondary | ICD-10-CM

## 2013-11-13 MED ORDER — ACCU-CHEK SOFTCLIX LANCET DEV MISC: Status: DC

## 2013-11-13 MED ORDER — GLUCOSE BLOOD VI STRP: ORAL_STRIP | Status: DC

## 2013-11-13 MED ORDER — ACCU-CHEK AVIVA PLUS W/DEVICE KIT: PACK | Status: DC

## 2013-11-13 NOTE — Telephone Encounter (Signed)
Patient is on extended release of the drug. She was switched to [facility] previously because it was on preferred list (see note from 06/23/13). Is there another extended release medication on the preferred list? All I see are diltiazem and verapamil, no extended release formulations.  Thanks.  Hilton Sinclair, MD

## 2013-11-13 NOTE — Telephone Encounter (Signed)
Called patient re: refill of blood sugar testing meter, strips, and lancets from [place], different from the [place] equipment she had already had ordered. She explained that she had requested this as she can get them for free through this pharmacy. I am okay with this as it is important that she can afford the testing to help keep her diabetes under control. Approving request for these: - Accu-chek Aviva Plus Meter - Accu-chek Aviva Plus Test Strp - Accu-chek softclix lancets   Hilton Sinclair, MD

## 2013-11-18 NOTE — Telephone Encounter (Signed)
Tried to look up formulary online, but [place] has not up loaded the formulary.  It says PDF coming soon. Derl Barrow, RN

## 2013-11-20 ENCOUNTER — Ambulatory Visit (INDEPENDENT_AMBULATORY_CARE_PROVIDER_SITE_OTHER): Admitting: Ophthalmology

## 2013-11-24 ENCOUNTER — Other Ambulatory Visit: Payer: Self-pay | Admitting: Family Medicine

## 2013-11-24 MED ORDER — "NEEDLE (DISP) 30G X 1/2"" MISC": Status: DC

## 2013-11-24 NOTE — Telephone Encounter (Signed)
Spoke with pt, she no longer is with [employer]. She now has [employer] and her meds are covered. She will call if she has any trouble.

## 2013-11-24 NOTE — Telephone Encounter (Signed)
Regular cardizem would be 4 x daily; there should be a long acting form that is covered; would ask pt to check [place]

## 2013-11-24 NOTE — Telephone Encounter (Signed)
Will route question to Dr Stanford Breed, patient's cardiologist - Does she need to be on extended release (Matzim LA), which is no longer covered by her insurance? Preferred drugs are diltiazem and verapamil, no extended release formulations. Thanks! Hilton Sinclair, MD

## 2013-11-25 ENCOUNTER — Telehealth: Payer: Self-pay | Admitting: *Deleted

## 2013-11-25 NOTE — Telephone Encounter (Signed)
Called [facility] about the medication clarification needed for the relion insulin syringes.  Per the representative, relion is a Educational psychologist brand.  They can dispense 31 gauge 6 mL 5/16 in 3/10 mL BD Brand.  Derl Barrow, RN

## 2013-11-25 NOTE — Telephone Encounter (Signed)
Thank you - this sounds like it should work! Hilton Sinclair, MD

## 2013-11-25 NOTE — Telephone Encounter (Signed)
Thank you - that sounds wonderful.  Hilton Sinclair, MD

## 2013-11-27 ENCOUNTER — Ambulatory Visit: Payer: Medicare HMO | Admitting: Family Medicine

## 2013-11-30 ENCOUNTER — Telehealth: Payer: Self-pay | Admitting: Family Medicine

## 2013-11-30 ENCOUNTER — Ambulatory Visit (INDEPENDENT_AMBULATORY_CARE_PROVIDER_SITE_OTHER): Admitting: Ophthalmology

## 2013-11-30 DIAGNOSIS — I10 Essential (primary) hypertension: Secondary | ICD-10-CM

## 2013-11-30 NOTE — Telephone Encounter (Signed)
Pt called because once again her insurance changed and now [facility] wants her back on Valsartan because that is the one that they cover. She will be at [facility] tomorrow and would like it called in so that she can pick it up with her other medications, jw

## 2013-12-03 NOTE — Telephone Encounter (Signed)
Please clarify which [facility] as she has 3 listed which she has used. Valsartan was sent on 2/5 to [facility] on [address] with 5 refills.  Hilton Sinclair, MD

## 2013-12-04 NOTE — Telephone Encounter (Signed)
LM for patient to call back.  [facility] ask [facility].  Inform her that it was already sent to [address] in feb.  Thanks [employer]

## 2013-12-07 NOTE — Telephone Encounter (Signed)
Pt called and she needs the V[facility]artan called into [facility] on [address] . They do not have anything on file there about refills. jw

## 2013-12-07 NOTE — Telephone Encounter (Signed)
Called and spoke with someone at [facility] on [address] because on review, I see that I prescribed valsartan 80mg  #30 on 2/5 with 5 refills and it was received by pharmacy, listed as [facility] on [address]. Person I spoke with made corrections on their end and will call patient.  Hilton Sinclair, MD

## 2013-12-17 ENCOUNTER — Ambulatory Visit (INDEPENDENT_AMBULATORY_CARE_PROVIDER_SITE_OTHER): Payer: Medicare HMO | Admitting: Family Medicine

## 2013-12-17 ENCOUNTER — Encounter: Payer: Self-pay | Admitting: Family Medicine

## 2013-12-17 VITALS — BP 156/70 | HR 63 | Temp 97.6°F | Resp 18 | Wt 217.0 lb

## 2013-12-17 DIAGNOSIS — E559 Vitamin D deficiency, unspecified: Secondary | ICD-10-CM

## 2013-12-17 DIAGNOSIS — N189 Chronic kidney disease, unspecified: Secondary | ICD-10-CM

## 2013-12-17 DIAGNOSIS — E119 Type 2 diabetes mellitus without complications: Secondary | ICD-10-CM

## 2013-12-17 DIAGNOSIS — R059 Cough, unspecified: Secondary | ICD-10-CM

## 2013-12-17 DIAGNOSIS — Z23 Encounter for immunization: Secondary | ICD-10-CM

## 2013-12-17 DIAGNOSIS — R05 Cough: Secondary | ICD-10-CM

## 2013-12-17 DIAGNOSIS — Z1211 Encounter for screening for malignant neoplasm of colon: Secondary | ICD-10-CM

## 2013-12-17 DIAGNOSIS — R053 Chronic cough: Secondary | ICD-10-CM

## 2013-12-17 LAB — CBC
HCT: 27.6 % — ABNORMAL LOW (ref 36.0–46.0)
Hemoglobin: 9 g/dL — ABNORMAL LOW (ref 12.0–15.0)
MCH: 25.2 pg — AB (ref 26.0–34.0)
MCHC: 32.6 g/dL (ref 30.0–36.0)
MCV: 77.3 fL — ABNORMAL LOW (ref 78.0–100.0)
PLATELETS: 356 10*3/uL (ref 150–400)
RBC: 3.57 MIL/uL — ABNORMAL LOW (ref 3.87–5.11)
RDW: 17.5 % — ABNORMAL HIGH (ref 11.5–15.5)
WBC: 8 10*3/uL (ref 4.0–10.5)

## 2013-12-17 LAB — POCT GLYCOSYLATED HEMOGLOBIN (HGB A1C): Hemoglobin A1C: 7.3

## 2013-12-17 LAB — BASIC METABOLIC PANEL
BUN: 43 mg/dL — ABNORMAL HIGH (ref 6–23)
CALCIUM: 8.7 mg/dL (ref 8.4–10.5)
CO2: 21 mEq/L (ref 19–32)
Chloride: 112 mEq/L (ref 96–112)
Creat: 2.59 mg/dL — ABNORMAL HIGH (ref 0.50–1.10)
GLUCOSE: 119 mg/dL — AB (ref 70–99)
Potassium: 4.9 mEq/L (ref 3.5–5.3)
SODIUM: 141 meq/L (ref 135–145)

## 2013-12-17 MED ORDER — ZOSTER VACCINE LIVE 19400 UNT/0.65ML ~~LOC~~ SOLR: 0.6500 mL | Freq: Once | SUBCUTANEOUS | Status: DC

## 2013-12-17 MED ORDER — PANTOPRAZOLE SODIUM 40 MG PO TBEC: 40.0000 mg | DELAYED_RELEASE_TABLET | Freq: Every day | ORAL | Status: DC

## 2013-12-17 MED ORDER — VITAMIN D (ERGOCALCIFEROL) 1.25 MG (50000 UNIT) PO CAPS: 50000.0000 [IU] | ORAL_CAPSULE | ORAL | Status: DC

## 2013-12-17 MED ORDER — CETIRIZINE HCL 10 MG PO TABS: 10.0000 mg | ORAL_TABLET | Freq: Every day | ORAL | Status: DC

## 2013-12-17 MED ORDER — FLUTICASONE PROPIONATE 50 MCG/ACT NA SUSP: 2.0000 | Freq: Every day | NASAL | Status: DC

## 2013-12-17 NOTE — Progress Notes (Signed)
Patient ID: [name], female   DOB: 04/09/1956, 58 y.o.   MRN: [id number] Subjective:   CC: Shingles vaccine, cough  HPI:   Shingles vaccine - Wants this. - Will rx vaccine.  Cough - Acid reflux medicine did not help. Has cough always when start eating and worse at night. Swelling in legs if too much water. No fevers. Never had swallow study. No sensation of food getting stuck. Lasts a few minutes, then eases up until lay down. No burning chest pain or burping. Present 2-3 years. Has runny nose and sneezing. Nonsmoker. Concerned about lung cancer. Never dizzy or pass out bc of cough. No h/o clot, no leg swelling.  Low vitamin D - Patient still has not started taking it despite discussing 08/2013. Does not remember this.  Diabetes - Due for A1c.   Review of Systems - Per HPI.   Smoking status: Former smoker    Objective:  Physical Exam BP 156/70  Pulse 63  Temp(Src) 97.6 F (36.4 C) (Oral)  Resp 18  Wt 217 lb (98.431 kg)  SpO2 97% GEN: NAD HEENT: Atraumatic, normocephalic, neck supple, EOMI, sclera clear  PULM: CTAB, normal effort ABD: Soft, nontender, nondistended SKIN: No rash or cyanosis; warm and well-perfused EXTR: Right ankle larger than left, no calf size difference or erythema or tenderness. No edema. No sensation below ankles. PSYCH: Mood and affect euthymic, normal rate and volume of speech NEURO: Awake, alert, no focal deficits grossly, normal speech    Assessment:     [name] is a 58 y.o. female here for eval of cough and low vit D and wanting shingles vaccine.     Plan:     # See problem list and after visit summary for problem-specific plans. Did not discuss: - wellness visit - CLT records - HTN: irbesartan rcently - DM   # Health Maintenance:  - Will rx shingles vaccine. - Referring to GI for colonoscopy.   Follow-up: Follow up in 2 weeks for f/u cough and diabetes.   Hilton Sinclair, MD [place]

## 2013-12-17 NOTE — Patient Instructions (Signed)
Good to see you today.  For your vitamin D, restart this and we will recheck the level in 8 weeks.  For your cough,  - Start the different allergy pill (zyrtec) and flonase. - Start the different medicine for acid reflux daily. - Follow up with me in 2 weeks and we can see if this is doing any better. - In the meantime, if you develop worsened cough or shortness of breath, seek immediate care.  I put in a referral for colonoscopy and printed a prescription for a shingles vaccine.  Come back to talk about diabetes.  Best.  Hilton Sinclair, MD

## 2013-12-20 NOTE — Assessment & Plan Note (Deleted)
-   Will rx shingles vaccine. - Referring to GI for colonoscopy.

## 2013-12-20 NOTE — Assessment & Plan Note (Signed)
A1c today.  

## 2013-12-20 NOTE — Assessment & Plan Note (Signed)
Untreated. - Start vitamin D. - Recheck in 2 months.

## 2013-12-20 NOTE — Assessment & Plan Note (Signed)
DDx includes GERD, allergies, CHF (EF 60-65% with grade I diastolic dysfunction 0000000. No crackles), med SE (ARB), esophageal spasm, asthma, postnasal drip, anemia. - Try different PPI: Protonix. Try zyrtec (instead of claritin) and flonase. - If no improvement, Consider sleep study, swallow study, CXR/CT, PFTs. - CBC. Last Hgb 7.5.

## 2013-12-22 ENCOUNTER — Telehealth: Payer: Self-pay | Admitting: Family Medicine

## 2013-12-22 MED ORDER — FERROUS SULFATE 325 (65 FE) MG PO TABS: 325.0000 mg | ORAL_TABLET | Freq: Every day | ORAL | Status: DC

## 2013-12-22 NOTE — Telephone Encounter (Signed)
Please call patient re: labs from last visit:  - Pt is anemic (with low MCV), partially due to kidney function but we also need to evaluate for other source of bleed. We ordered GI referral for colonoscopy so I want to be sure she gets this. Anemia could also partly be causing cough. Start iron once daily (may need stool softener because causes constipation).  - Creatinine is no better (also no worse). I believe she follows with nephrology and I will await their recommendations. In the mean time, stay hydrated.   - A1c is worse. We will need to discuss at follow up. In the meantime, watch what you eat (sugars, carbs). Limit to no more than 2 carb servings per meal.  Hilton Sinclair, MD

## 2013-12-22 NOTE — Telephone Encounter (Signed)
Pt informed and made appt for 01/13/14 [name], [name]

## 2013-12-29 ENCOUNTER — Telehealth: Payer: Self-pay | Admitting: Family Medicine

## 2013-12-29 NOTE — Telephone Encounter (Signed)
Pt needs refill on matzim. [employer] will be faxing the authorization It needs to be marked urgent She only has 2 pills left. Please call when this is done

## 2013-12-30 NOTE — Telephone Encounter (Signed)
Filled out and will fax. Please let patient know. Thanks! Hilton Sinclair, MD

## 2014-01-01 NOTE — Telephone Encounter (Signed)
Pt called and wanted to know if the papers were faxed back and if the doctor put urgent on them. She is out of medication and she needs to know if this was done. Please call her and let her know. jw

## 2014-01-01 NOTE — Telephone Encounter (Signed)
Yes, please let patient know I filled it out and placed it in bin for faxing on 4/8 around 2 PM.  Hilton Sinclair, MD

## 2014-01-01 NOTE — Telephone Encounter (Signed)
Will forward to MD to see if she has finished form. Amarys Sliwinski,CMA

## 2014-01-01 NOTE — Telephone Encounter (Signed)
Verified with MD that form was send urgent.  Pt is aware.  Jazmin Hartsell,CMA

## 2014-01-06 ENCOUNTER — Telehealth: Payer: Self-pay | Admitting: Family Medicine

## 2014-01-06 NOTE — Telephone Encounter (Signed)
Pt called and wanted to know what the status was of her refill request that is being sent to [facility] on [address]. Please call her and let her know what is going on. jw

## 2014-01-07 NOTE — Telephone Encounter (Signed)
Spoke with patient and states that [place] does not cover her matzim and will only cover diltiazem.  Would like to know if you can call this in for her.  Thanks  [employer]

## 2014-01-08 MED ORDER — DILTIAZEM HCL ER 240 MG PO CP24: 240.0000 mg | ORAL_CAPSULE | Freq: Every day | ORAL | Status: DC

## 2014-01-08 NOTE — Telephone Encounter (Signed)
Changed brand name dilt to 24 hour Dilt generic at same dose.   Will ask staff to notify.   Laroy Apple, MD [place] Resident, PGY-2 01/08/2014, 3:13 PM

## 2014-01-08 NOTE — Telephone Encounter (Signed)
LM with son that "rx was called into pharmacy".  He is aware and will tell him mother.  [name],CMA

## 2014-01-08 NOTE — Telephone Encounter (Signed)
Will forward to Dr. Wendi Snipes to refill medication.  Jazmin Hartsell,CMA

## 2014-01-12 ENCOUNTER — Telehealth: Payer: Self-pay | Admitting: Family Medicine

## 2014-01-12 MED ORDER — "NEEDLE (DISP) 30G X 1/2"" MISC": Status: DC

## 2014-01-12 NOTE — Telephone Encounter (Signed)
Please let patient know I represcribed the following to [facility]: 31 gauge 6 mL 5/16 in 3/10 mL BD Brand. Could not find this exact type of preset choices, so typed this into comments section for pharmacy.  Hilton Sinclair, MD

## 2014-01-12 NOTE — Telephone Encounter (Signed)
Pt called because [facility] is waiting on a response from [place] concerning a refill on her syringes. Can we fax or call this in today. jw

## 2014-01-12 NOTE — Telephone Encounter (Signed)
LMOVM for pt to return call.  Please advise that MD resent Rx. [name]

## 2014-01-13 ENCOUNTER — Ambulatory Visit: Payer: Medicare HMO | Admitting: Family Medicine

## 2014-01-15 ENCOUNTER — Other Ambulatory Visit: Payer: Self-pay | Admitting: Family Medicine

## 2014-01-15 NOTE — Telephone Encounter (Signed)
Refused request for Vitamin D refill because ordered 8 week course 3/26 office visit and plan to recheck Vitamin D level in clinic prior to reordering.  Hilton Sinclair, MD

## 2014-01-23 ENCOUNTER — Other Ambulatory Visit: Payer: Self-pay | Admitting: Cardiology

## 2014-01-26 ENCOUNTER — Encounter: Payer: Self-pay | Admitting: Gastroenterology

## 2014-01-27 ENCOUNTER — Encounter: Payer: Self-pay | Admitting: Gastroenterology

## 2014-01-29 ENCOUNTER — Ambulatory Visit: Payer: Medicare HMO | Admitting: Family Medicine

## 2014-02-01 ENCOUNTER — Other Ambulatory Visit: Payer: Self-pay | Admitting: Family Medicine

## 2014-02-01 MED ORDER — APIXABAN 5 MG PO TABS: ORAL_TABLET | ORAL | Status: DC

## 2014-02-13 ENCOUNTER — Other Ambulatory Visit: Payer: Self-pay | Admitting: Family Medicine

## 2014-02-18 ENCOUNTER — Other Ambulatory Visit: Payer: Self-pay | Admitting: Family Medicine

## 2014-02-18 MED ORDER — ATORVASTATIN CALCIUM 80 MG PO TABS: 80.0000 mg | ORAL_TABLET | Freq: Every day | ORAL | Status: DC

## 2014-02-24 ENCOUNTER — Telehealth: Payer: Self-pay | Admitting: Family Medicine

## 2014-02-24 NOTE — Telephone Encounter (Signed)
Patient will need [facility] referral for colonoscopy. Patient's appt is 03/23/14.

## 2014-02-25 ENCOUNTER — Other Ambulatory Visit: Payer: Self-pay | Admitting: Internal Medicine

## 2014-02-25 NOTE — Telephone Encounter (Signed)
Pt needs to chance PCP name in her INS card.  Pt is aware and [facility] also is aware about the situation, Pt will bring the  INS card to [facility] as soon pt has the correct PCP Name .   [place]

## 2014-02-26 NOTE — Telephone Encounter (Signed)
So do I need to wait until PCP name changed before I make referral?  Hilton Sinclair, MD

## 2014-03-01 ENCOUNTER — Other Ambulatory Visit: Payer: Self-pay | Admitting: Family Medicine

## 2014-03-23 ENCOUNTER — Ambulatory Visit: Payer: Medicare HMO | Admitting: Gastroenterology

## 2014-03-27 ENCOUNTER — Other Ambulatory Visit: Payer: Self-pay | Admitting: Family Medicine

## 2014-03-29 ENCOUNTER — Other Ambulatory Visit: Payer: Self-pay | Admitting: Family Medicine

## 2014-03-29 DIAGNOSIS — E559 Vitamin D deficiency, unspecified: Secondary | ICD-10-CM

## 2014-03-29 NOTE — Telephone Encounter (Signed)
Patient was supposed to come in for vitamin D recheck end of May so we could know what to do with her rx. Please let her know she needs recheck. I will order lab and she can make lab-only visit.  Hilton Sinclair, MD

## 2014-03-30 ENCOUNTER — Other Ambulatory Visit: Payer: Self-pay | Admitting: Family Medicine

## 2014-03-30 NOTE — Telephone Encounter (Signed)
Has appt with you 04/07/14. [name], [name]

## 2014-04-02 NOTE — Telephone Encounter (Signed)
Pt is aware of this. [name],CMA  

## 2014-04-02 NOTE — Telephone Encounter (Signed)
Received this refill request a second time. I know she has an appt coming up. Did we let the patient know we will recheck at that time, prior to deciding on continuation / refill of vitamin D?  Thanks. Hilton Sinclair, MD

## 2014-04-04 ENCOUNTER — Other Ambulatory Visit: Payer: Self-pay | Admitting: Family Medicine

## 2014-04-06 ENCOUNTER — Other Ambulatory Visit: Payer: Self-pay | Admitting: *Deleted

## 2014-04-06 DIAGNOSIS — R05 Cough: Secondary | ICD-10-CM

## 2014-04-06 DIAGNOSIS — R053 Chronic cough: Secondary | ICD-10-CM

## 2014-04-06 MED ORDER — DILTIAZEM HCL ER 240 MG PO CP24: ORAL_CAPSULE | ORAL | Status: DC

## 2014-04-06 MED ORDER — PANTOPRAZOLE SODIUM 40 MG PO TBEC: 40.0000 mg | DELAYED_RELEASE_TABLET | Freq: Every day | ORAL | Status: DC

## 2014-04-06 MED ORDER — ATORVASTATIN CALCIUM 80 MG PO TABS: 80.0000 mg | ORAL_TABLET | Freq: Every day | ORAL | Status: DC

## 2014-04-06 MED ORDER — CARVEDILOL 12.5 MG PO TABS: ORAL_TABLET | ORAL | Status: DC

## 2014-04-06 MED ORDER — INSULIN REGULAR HUMAN 100 UNIT/ML IJ SOLN: 10.0000 [IU] | Freq: Three times a day (TID) | INTRAMUSCULAR | Status: DC

## 2014-04-06 MED ORDER — "NEEDLE (DISP) 30G X 1/2"" MISC": Status: DC

## 2014-04-06 MED ORDER — HYDRALAZINE HCL 25 MG PO TABS: ORAL_TABLET | ORAL | Status: DC

## 2014-04-06 MED ORDER — INSULIN GLARGINE 100 UNIT/ML ~~LOC~~ SOLN: 20.0000 [IU] | Freq: Every day | SUBCUTANEOUS | Status: DC

## 2014-04-06 MED ORDER — ISOSORBIDE MONONITRATE ER 30 MG PO TB24: 30.0000 mg | ORAL_TABLET | Freq: Every day | ORAL | Status: DC

## 2014-04-07 ENCOUNTER — Ambulatory Visit: Payer: Medicare HMO | Admitting: Family Medicine

## 2014-04-07 MED ORDER — APIXABAN 5 MG PO TABS: ORAL_TABLET | ORAL | Status: DC

## 2014-04-07 MED ORDER — VALSARTAN 80 MG PO TABS: 80.0000 mg | ORAL_TABLET | Freq: Every day | ORAL | Status: DC

## 2014-04-07 MED ORDER — FUROSEMIDE 40 MG PO TABS: 40.0000 mg | ORAL_TABLET | Freq: Every day | ORAL | Status: DC

## 2014-04-07 NOTE — Telephone Encounter (Signed)
Please call patient to let her know she is overdue for follow up. We need to f/u her creatinine and BP control.

## 2014-04-08 NOTE — Telephone Encounter (Signed)
Pt has an appt 04-29-14. Jazmin Hartsell,CMA

## 2014-04-29 ENCOUNTER — Ambulatory Visit: Payer: Commercial Managed Care - HMO | Admitting: Family Medicine

## 2014-05-14 NOTE — Telephone Encounter (Signed)
error 

## 2014-05-18 ENCOUNTER — Other Ambulatory Visit: Payer: Self-pay | Admitting: Family Medicine

## 2014-05-19 ENCOUNTER — Ambulatory Visit: Payer: Commercial Managed Care - HMO | Admitting: Cardiology

## 2014-06-02 ENCOUNTER — Other Ambulatory Visit: Payer: Self-pay | Admitting: Family Medicine

## 2014-06-04 NOTE — Telephone Encounter (Signed)
Patient needs to come in for an appointment for follow-up. Refill given for one month of insulin. Thanks.

## 2014-06-07 ENCOUNTER — Encounter: Payer: Self-pay | Admitting: *Deleted

## 2014-06-07 NOTE — Telephone Encounter (Signed)
Mailed letter. [name], [name]

## 2014-06-09 ENCOUNTER — Encounter: Payer: Self-pay | Admitting: Family Medicine

## 2014-06-09 NOTE — Progress Notes (Signed)
Patient ID: [name], female   DOB: Apr 06, 1956, 57 y.o.   MRN: [id number]  Received fax from [facility] that Ms Lalone got herpes zoster immunization 05/26/14. Blue Team, can you please input this into Health Maintenance? Scanning the document. Thx! Hilton Sinclair, MD

## 2014-06-09 NOTE — Progress Notes (Signed)
Done. [name], [name]

## 2014-06-14 ENCOUNTER — Other Ambulatory Visit: Payer: Self-pay | Admitting: Family Medicine

## 2014-06-15 NOTE — Telephone Encounter (Signed)
Pt informed and appt made for 07/19/2014. [name], [name]

## 2014-06-15 NOTE — Telephone Encounter (Signed)
Filling valsartan for 1 month. See that she was recently contacted about needing appointment before further refills. Please also call her to reiterate this as I still have not seen her. Thanks. Hilton Sinclair, MD

## 2014-06-30 ENCOUNTER — Other Ambulatory Visit: Payer: Self-pay | Admitting: Family Medicine

## 2014-07-06 NOTE — Telephone Encounter (Signed)
error 

## 2014-07-07 ENCOUNTER — Other Ambulatory Visit: Payer: Self-pay | Admitting: Family Medicine

## 2014-07-09 NOTE — Telephone Encounter (Signed)
It appears patient has an appointment late October. Will refill. Hilton Sinclair, MD

## 2014-07-12 NOTE — Progress Notes (Signed)
HPI: FU coronary artery disease and atrial fibrillation. Patient was admitted in April of 2014 following a syncopal episode. She fractured her fibula which required surgical intervention. She was noted to be in atrial fibrillation with a rapid ventricular response at the time of admission. She converted spontaneously to sinus rhythm. She also ruled in for a non-ST elevation myocardial infarction. Cardiac catheterization in April of 2014 revealed normal left main, 40% mid LAD and 30% distal. 80% proximal circumflex and 30% right coronary artery. It was felt medical therapy indicated unless she had recurrent symptoms of chest pain and PCI could be considered at that time. Echocardiogram in April of 2014 showed normal LV function, mild left ventricular hypertrophy and mild left atrial enlargement. Carotid Dopplers in April of 2014 showed no significant stenosis. Since she was last seen, She denies dyspnea on exertion, orthopnea, PND, palpitations, syncope or exertional chest pain. She does have mild edema in her right lower extremity. This is from previous fracture.   Current Outpatient Prescriptions  Medication Sig Dispense Refill  . atorvastatin (LIPITOR) 80 MG tablet Take 1 tablet (80 mg total) by mouth at bedtime.  90 tablet  3  . Blood Glucose Monitoring Suppl (ACCU-CHEK AVIVA PLUS) W/DEVICE KIT Please provide meter and test strp for 1 month  1 kit  0  . carvedilol (COREG) 12.5 MG tablet TAKE ONE TABLET BY MOUTH TWICE DAILY WITH  FOOD  180 tablet  1  . cetirizine (ZYRTEC) 10 MG tablet Take 1 tablet (10 mg total) by mouth daily.  30 tablet  11  . diltiazem (DILACOR XR) 240 MG 24 hr capsule TAKE ONE CAPSULE BY MOUTH ONCE DAILY  90 capsule  1  . ELIQUIS 5 MG TABS tablet TAKE 1 TABLET TWICE DAILY  180 tablet  2  . ferrous sulfate 325 (65 FE) MG tablet Take 1 tablet (325 mg total) by mouth daily with breakfast.  30 tablet  3  . fluticasone (FLONASE) 50 MCG/ACT nasal spray Place 2 sprays into both  nostrils daily.  16 g  6  . furosemide (LASIX) 40 MG tablet Take 1 tablet (40 mg total) by mouth daily. Needs PCP appointment.  30 tablet  0  . glucose blood (ACCU-CHEK AVIVA PLUS) test strip Use as instructed  100 each  12  . hydrALAZINE (APRESOLINE) 25 MG tablet TAKE ONE TABLET BY MOUTH twice daily      . insulin glargine (LANTUS) 100 UNIT/ML injection Inject 0.2 mLs (20 Units total) into the skin at bedtime.  10 mL  5  . Insulin Syringes, Disposable, U-100 0.5 ML MISC 4x daily injections  100 each  8  . isosorbide mononitrate (IMDUR) 30 MG 24 hr tablet Take 1 tablet (30 mg total) by mouth daily.  30 tablet  5  . Lancet Devices (ACCU-CHEK SOFTCLIX) lancets Fill for 1 month for TID testing. Use as instructed  90 each  0  . Multiple Vitamin (MULTIVITAMIN WITH MINERALS) TABS Take 1 tablet by mouth daily.      Marland Kitchen NEEDLE, DISP, 30 G (BD DISP NEEDLES) 30G X 1/2" MISC For 4x daily injections  100 each  13  . nitroGLYCERIN (NITROSTAT) 0.4 MG SL tablet Place 1 tablet (0.4 mg total) under the tongue every 5 (five) minutes as needed for chest pain.  30 tablet  0  . NOVOLIN R 100 UNIT/ML injection INJECT 10 TO 20 UNITS (0.1-0.2ML) INTO THE SKIN THREE TIMES DAILY BEFORE MEALS. SLIDING SCALE OF INSULIN DOSAGE  60 mL  0  . pantoprazole (PROTONIX) 40 MG tablet Take 1 tablet (40 mg total) by mouth daily. Needs MD eval.  30 tablet  0  . traMADol (ULTRAM) 50 MG tablet Take 1 tablet (50 mg total) by mouth every 6 (six) hours as needed for pain. Maximum dose= 8 tablets per day  60 tablet  0  . valsartan (DIOVAN) 80 MG tablet TAKE 1 TABLET EVERY DAY  (NEED PRIMARY CARE PHYSICIAN APPOINTMENT)  30 tablet  0  . Vitamin D, Ergocalciferol, (DRISDOL) 50000 UNITS CAPS capsule Take 1 capsule (50,000 Units total) by mouth every 7 (seven) days. (wednesdays). Need recheck level end of May with PCP.  7 capsule  0  . zoster vaccine live, PF, (ZOSTAVAX) 21194 UNT/0.65ML injection Inject 19,400 Units into the skin once.  1 each  0    No current facility-administered medications for this visit.     Past Medical History  Diagnosis Date  . Hypertension   . Diabetes mellitus   . Myocardial infarction     in April 2014  . Chronic kidney disease, stage III (moderate)   . Constipation   . Anemia   . History of blood transfusion   . Atrial fibrillation   . CAD (coronary artery disease)     Past Surgical History  Procedure Laterality Date  . Laser surgery for diabetic retinopathy    . Abdominal hysterectomy    . Orif ankle fracture Right 01/12/2013    Procedure: OPEN REDUCTION INTERNAL FIXATION (ORIF) ANKLE FRACTURE;  Surgeon: Newt Minion, MD;  Location: [place];  Service: Orthopedics;  Laterality: Right;  . Eye surgery    . Hardware removal Right 02/13/2013    Procedure: HARDWARE REMOVAL;  Surgeon: Newt Minion, MD;  Location: [place];  Service: Orthopedics;  Laterality: Right;  Right Ankle Removal Hardware, Debridement, Place Wound VAC    History   Social History  . Marital Status: Legally Separated    Spouse Name: N/A    Number of Children: N/A  . Years of Education: N/A   Occupational History  . Disabled    Social History Main Topics  . Smoking status: Former Research scientist (life sciences)  . Smokeless tobacco: Not on file  . Alcohol Use: No  . Drug Use: No  . Sexual Activity: Not on file   Other Topics Concern  . Not on file   Social History Narrative   Patient lives with her son..    ROS: no fevers or chills, productive cough, hemoptysis, dysphasia, odynophagia, melena, hematochezia, dysuria, hematuria, rash, seizure activity, orthopnea, PND, claudication. Remaining systems are negative.  Physical Exam: Well-developed well-nourished in no acute distress.  Skin is warm and dry.  HEENT is normal.  Neck is supple.  Chest is clear to auscultation with normal expansion.  Cardiovascular exam is regular rate and rhythm.  Abdominal exam nontender or distended. No masses palpated. Extremities show Trace to 1+ right  ankle edema. neuro grossly intact  ECG Sinus rhythm at a rate of 62. No significant ST changes.

## 2014-07-13 ENCOUNTER — Ambulatory Visit (INDEPENDENT_AMBULATORY_CARE_PROVIDER_SITE_OTHER): Payer: Commercial Managed Care - HMO | Admitting: Cardiology

## 2014-07-13 ENCOUNTER — Encounter: Payer: Self-pay | Admitting: Cardiology

## 2014-07-13 ENCOUNTER — Telehealth: Payer: Self-pay | Admitting: Cardiology

## 2014-07-13 VITALS — BP 176/90 | HR 62 | Ht 67.0 in | Wt 229.8 lb

## 2014-07-13 DIAGNOSIS — I10 Essential (primary) hypertension: Secondary | ICD-10-CM

## 2014-07-13 DIAGNOSIS — E785 Hyperlipidemia, unspecified: Secondary | ICD-10-CM

## 2014-07-13 MED ORDER — HYDRALAZINE HCL 50 MG PO TABS: 50.0000 mg | ORAL_TABLET | Freq: Three times a day (TID) | ORAL | Status: DC

## 2014-07-13 NOTE — Assessment & Plan Note (Signed)
Patient remains in sinus rhythm. Continue Cardizem, beta blocker and apixaban. Check hemoglobin and renal function.

## 2014-07-13 NOTE — Assessment & Plan Note (Signed)
Blood pressure elevated. Increase hydralazine to 50 mg by mouth 3 times a day and follow.

## 2014-07-13 NOTE — Patient Instructions (Signed)
Your physician wants you to follow-up in: [place] will receive a reminder letter in the mail two months in advance. If you don't receive a letter, please call our office to schedule the follow-up appointment.  INCREASE HYDRALAZINE TO 50 MG THREE TIMES DAILY= 2 OF THE 25 MG TABLETS THREE TIMES DAILY  Your physician recommends that you HAVE LAB WORK TODAY

## 2014-07-13 NOTE — Assessment & Plan Note (Signed)
Continue statin. Not on aspirin given need for anticoagulation. 

## 2014-07-13 NOTE — Assessment & Plan Note (Signed)
Continue statin. Check lipids and liver. 

## 2014-07-14 LAB — LIPID PANEL
CHOLESTEROL: 145 mg/dL (ref 0–200)
HDL: 44 mg/dL (ref >39)
LDL Cholesterol: 84 mg/dL (ref 0–99)
Total CHOL/HDL Ratio: 3.3 Ratio
Triglycerides: 87 mg/dL (ref <150)
VLDL: 17 mg/dL (ref 0–40)

## 2014-07-14 LAB — CBC
HCT: 27.3 % — ABNORMAL LOW (ref 36.0–46.0)
Hemoglobin: 8.8 g/dL — ABNORMAL LOW (ref 12.0–15.0)
MCH: 26 pg (ref 26.0–34.0)
MCHC: 32.2 g/dL (ref 30.0–36.0)
MCV: 80.8 fL (ref 78.0–100.0)
PLATELETS: 377 10*3/uL (ref 150–400)
RBC: 3.38 MIL/uL — ABNORMAL LOW (ref 3.87–5.11)
RDW: 14.8 % (ref 11.5–15.5)
WBC: 9.5 10*3/uL (ref 4.0–10.5)

## 2014-07-14 LAB — BASIC METABOLIC PANEL WITH GFR
BUN: 38 mg/dL — AB (ref 6–23)
CO2: 22 meq/L (ref 19–32)
Calcium: 9.2 mg/dL (ref 8.4–10.5)
Chloride: 107 mEq/L (ref 96–112)
Creat: 2.56 mg/dL — ABNORMAL HIGH (ref 0.50–1.10)
GFR, EST AFRICAN AMERICAN: 23 mL/min — AB
GFR, Est Non African American: 20 mL/min — ABNORMAL LOW
Glucose, Bld: 70 mg/dL (ref 70–99)
Potassium: 5.1 mEq/L (ref 3.5–5.3)
Sodium: 138 mEq/L (ref 135–145)

## 2014-07-14 LAB — HEPATIC FUNCTION PANEL
ALT: 25 U/L (ref 0–35)
AST: 21 U/L (ref 0–37)
Albumin: 3.8 g/dL (ref 3.5–5.2)
Alkaline Phosphatase: 92 U/L (ref 39–117)
BILIRUBIN DIRECT: 0.1 mg/dL (ref 0.0–0.3)
BILIRUBIN INDIRECT: 0.2 mg/dL (ref 0.2–1.2)
Total Bilirubin: 0.3 mg/dL (ref 0.2–1.2)
Total Protein: 6.7 g/dL (ref 6.0–8.3)

## 2014-07-14 NOTE — Telephone Encounter (Signed)
Closed encounter °

## 2014-07-14 NOTE — Addendum Note (Signed)
Addended by: Vennie Homans on: 07/14/2014 04:47 PM   Modules accepted: Orders

## 2014-07-19 ENCOUNTER — Encounter: Payer: Self-pay | Admitting: Family Medicine

## 2014-07-19 ENCOUNTER — Ambulatory Visit (INDEPENDENT_AMBULATORY_CARE_PROVIDER_SITE_OTHER): Payer: Commercial Managed Care - HMO | Admitting: Family Medicine

## 2014-07-19 ENCOUNTER — Telehealth: Payer: Self-pay | Admitting: Family Medicine

## 2014-07-19 VITALS — BP 179/77 | HR 61 | Temp 97.6°F | Ht 67.0 in | Wt 231.0 lb

## 2014-07-19 DIAGNOSIS — I10 Essential (primary) hypertension: Secondary | ICD-10-CM

## 2014-07-19 DIAGNOSIS — E785 Hyperlipidemia, unspecified: Secondary | ICD-10-CM

## 2014-07-19 DIAGNOSIS — E119 Type 2 diabetes mellitus without complications: Secondary | ICD-10-CM

## 2014-07-19 DIAGNOSIS — E049 Nontoxic goiter, unspecified: Secondary | ICD-10-CM

## 2014-07-19 DIAGNOSIS — Z23 Encounter for immunization: Secondary | ICD-10-CM

## 2014-07-19 LAB — POCT GLYCOSYLATED HEMOGLOBIN (HGB A1C): HEMOGLOBIN A1C: 6.9

## 2014-07-19 MED ORDER — ISOSORBIDE MONONITRATE ER 30 MG PO TB24: 30.0000 mg | ORAL_TABLET | Freq: Every day | ORAL | Status: DC

## 2014-07-19 MED ORDER — ATORVASTATIN CALCIUM 80 MG PO TABS: 80.0000 mg | ORAL_TABLET | Freq: Every day | ORAL | Status: DC

## 2014-07-19 MED ORDER — INSULIN GLARGINE 100 UNIT/ML ~~LOC~~ SOLN: 17.0000 [IU] | Freq: Every day | SUBCUTANEOUS | Status: DC

## 2014-07-19 MED ORDER — HYDRALAZINE HCL 50 MG PO TABS: 50.0000 mg | ORAL_TABLET | Freq: Three times a day (TID) | ORAL | Status: DC

## 2014-07-19 MED ORDER — DILTIAZEM HCL ER 240 MG PO CP24: ORAL_CAPSULE | ORAL | Status: DC

## 2014-07-19 MED ORDER — FUROSEMIDE 40 MG PO TABS: 40.0000 mg | ORAL_TABLET | Freq: Every day | ORAL | Status: DC

## 2014-07-19 MED ORDER — CARVEDILOL 12.5 MG PO TABS: ORAL_TABLET | ORAL | Status: DC

## 2014-07-19 MED ORDER — VALSARTAN 80 MG PO TABS: ORAL_TABLET | ORAL | Status: DC

## 2014-07-19 NOTE — Assessment & Plan Note (Signed)
Last LFTs checked 07/13/14 WNL. - Refilled lipitor.

## 2014-07-19 NOTE — Progress Notes (Signed)
Patient ID: [name], female   DOB: 1956/06/24, 58 y.o.   MRN: [id number] Subjective:   CC: Follow up HTN and DM  HPI:   HTN She gets anxiety when she has to leave the home and thinks this plays a role in today's high bp. She also feels a bit nauseated at that time. Symptoms include headaches at home 3-4 days prior to seeing Dr Stanford Breed, though it stopped after increased hydralazine at 10/20 visit and Home values have been 140s/80s. Takes medications as prescribed. No increased blurred vision from baseline. No chest pain. Occasional chest soreness like she has worked out too much. No trouble breathing. Joined a gym and plans to start Wednesday. No dizziness or fainting. Creatinine at baseline 2.4-2.5.  Diabetes Mellitus A1c march 7.3. Takes lantus 20 units daily at night. A1c today 6.9. Checks blood sugar at home and does get lows 40s 3x/month that usually occur (middle of night) even if eaten dinner. Last syncopal episode was years ago which was from hypoglycemia. Checks right after eating and it's in 200s. Injects 5-10 units mealtime three times a day. Highest fasting is 117. Denies chest pain, dyspnea, or other concerns. Had last ophtho exam >1 year ago.   Review of Systems - Per HPI.   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    Objective:  Physical Exam BP 179/77  Pulse 61  Temp(Src) 97.6 F (36.4 C) (Oral)  Ht 5\' 7"  (1.702 m)  Wt 231 lb (104.781 kg)  BMI 36.17 kg/m2 Recheck similar. GEN: NAD CV: RRR, no m/r/g PULM: Normal effort  HEENT: Neck supple; thyriod left-sided enlargement about 2cm firm but not hard nodule EXTR: RLE baseline edema, compression stocking in place, no tenderness, no erythema, trace pitting bilaterally PSYCH: Mood and affect euthymic HEENT: Difficult to visualize due to h/o multiple ophthalmologic surgeries; no obvious  hemmorhage; PERRL    Assessment:     [name] is a 58 y.o. female here for f/u of DM and HTN.  Plan:     # See problem list and after visit summary for problem-specific plans.  # Health Maintenance: Asked patient to make well woman visit. At that time, also check TSH and vitamin D level.  Follow-up: Follow up when available for well woman visit.   Hilton Sinclair, MD [place]

## 2014-07-19 NOTE — Patient Instructions (Addendum)
Good to see you.  I will refill your medications. Follow up with me for your blood pressure and diabetes once every 6 months or so. Since your BP med was just increased, be sure to check BPs at home 3x/week and call me in 2 weeks with these numbers.  You need a well woman visit every year where we review that you are up to date on age-based screening. Make this appointment if you have not seen me this year for that.  For your diabetes, we are decreasing insulin lantus to 17 units and take this in the morning instead of at night. Call if any other low blood sugars and keep sugar with you at all times (candy, juice).   Best,  Hilton Sinclair, MD

## 2014-07-19 NOTE — Assessment & Plan Note (Signed)
Thyroid enlarged on exam, per patient this is baseline. Has had it biopsied in the past per patient and it was normal. Last TSH 12/2012 was normal. - Recheck TSH with well woman visit (asked pt to make appt).

## 2014-07-19 NOTE — Assessment & Plan Note (Addendum)
BP elevated in clinic, though pt reports values 140s/80s since increased hydralazine last week with cardiologist. No symptoms of end-organ damage. - refill lasix and urged f/u with renal; also refilled valsartan, coreg, diltiazem, hydralazine, and imdur for 6 months. - Cr at baseline 2.4-2.5, had seen Dr Mercy Moore, overdue for follow up (saw >3 mo ago). - Check BPs at home 3x/week and call me in 2 weeks with these numbers. - F/u with me 6 months.

## 2014-07-19 NOTE — Telephone Encounter (Signed)
Please let [name] know I looked at her thyroid biopsies and they were normal but the recommendation is for repeat ultrasound in 6-12 months. We can discuss this at her next appointment.  Hilton Sinclair, MD

## 2014-07-19 NOTE — Telephone Encounter (Signed)
Pt informed. [name]  

## 2014-07-19 NOTE — Assessment & Plan Note (Addendum)
Well-controlled with A1c today 6.9, but reports hypoglycemia to 40s 3x/month. - Decrease lantus to 17 units daily and take in AM. Rx'ed new dose. Call with lows, keep glucose/candy nearby. - Ophthalmologist overdue especially with blurred vision at baseline - Dr Zigmund Daniel. - Refill lipitor 1 year, LFTs normal. - foot exam at f/u

## 2014-08-11 ENCOUNTER — Other Ambulatory Visit: Payer: Self-pay | Admitting: Family Medicine

## 2014-08-24 ENCOUNTER — Telehealth: Payer: Self-pay | Admitting: Family Medicine

## 2014-08-24 DIAGNOSIS — E08319 Diabetes mellitus due to underlying condition with unspecified diabetic retinopathy without macular edema: Secondary | ICD-10-CM

## 2014-08-24 NOTE — Telephone Encounter (Signed)
Pt wants a referral to Dr. Rodena Piety her diabetic doctor. jw

## 2014-08-24 NOTE — Telephone Encounter (Signed)
Patient requested referral to Dr Zigmund Daniel who I assume is her ophthalomologist ([facility]). Referral placed.  Hilton Sinclair, MD PGY-3, [place]

## 2014-08-24 NOTE — Telephone Encounter (Signed)
Will forward to MD to place referral. Jazmin Hartsell,CMA  

## 2014-08-30 ENCOUNTER — Other Ambulatory Visit: Payer: Self-pay | Admitting: *Deleted

## 2014-08-31 ENCOUNTER — Other Ambulatory Visit: Payer: Self-pay | Admitting: *Deleted

## 2014-08-31 DIAGNOSIS — I10 Essential (primary) hypertension: Secondary | ICD-10-CM

## 2014-08-31 DIAGNOSIS — R053 Chronic cough: Secondary | ICD-10-CM

## 2014-08-31 DIAGNOSIS — R05 Cough: Secondary | ICD-10-CM

## 2014-08-31 MED ORDER — INSULIN GLARGINE 100 UNIT/ML ~~LOC~~ SOLN: 17.0000 [IU] | Freq: Every day | SUBCUTANEOUS | Status: DC

## 2014-08-31 MED ORDER — "NEEDLE (DISP) 30G X 1/2"" MISC": Status: DC

## 2014-08-31 MED ORDER — INSULIN REGULAR HUMAN 100 UNIT/ML IJ SOLN: INTRAMUSCULAR | Status: DC

## 2014-08-31 MED ORDER — PANTOPRAZOLE SODIUM 40 MG PO TBEC: 40.0000 mg | DELAYED_RELEASE_TABLET | Freq: Every day | ORAL | Status: DC

## 2014-08-31 NOTE — Telephone Encounter (Signed)
Refusing coreg, diltiazem, hydralazine, and imdur because just filled all at last appointment. Approving lantus, novolin R, needles, and pantoprazole. Hilton Sinclair, MD

## 2014-08-31 NOTE — Telephone Encounter (Signed)
She should still have refills on eliquis (filled 06/2014 for 9 months), lasix and valsartan (filled 06/2014 for 6 months) so will refuse this refill request. Hilton Sinclair, MD

## 2014-09-02 ENCOUNTER — Encounter (HOSPITAL_COMMUNITY): Payer: Self-pay | Admitting: Internal Medicine

## 2014-09-02 ENCOUNTER — Ambulatory Visit (INDEPENDENT_AMBULATORY_CARE_PROVIDER_SITE_OTHER): Payer: Commercial Managed Care - HMO | Admitting: Ophthalmology

## 2014-09-02 DIAGNOSIS — I10 Essential (primary) hypertension: Secondary | ICD-10-CM

## 2014-09-02 DIAGNOSIS — H35033 Hypertensive retinopathy, bilateral: Secondary | ICD-10-CM

## 2014-09-02 DIAGNOSIS — E11359 Type 2 diabetes mellitus with proliferative diabetic retinopathy without macular edema: Secondary | ICD-10-CM

## 2014-09-02 DIAGNOSIS — E11351 Type 2 diabetes mellitus with proliferative diabetic retinopathy with macular edema: Secondary | ICD-10-CM

## 2014-09-02 DIAGNOSIS — H26491 Other secondary cataract, right eye: Secondary | ICD-10-CM

## 2014-09-02 DIAGNOSIS — E11311 Type 2 diabetes mellitus with unspecified diabetic retinopathy with macular edema: Secondary | ICD-10-CM

## 2014-09-20 ENCOUNTER — Telehealth: Payer: Self-pay | Admitting: Family Medicine

## 2014-09-20 DIAGNOSIS — N184 Chronic kidney disease, stage 4 (severe): Secondary | ICD-10-CM

## 2014-09-20 DIAGNOSIS — E559 Vitamin D deficiency, unspecified: Secondary | ICD-10-CM

## 2014-09-20 NOTE — Telephone Encounter (Signed)
Will forward to MD. Braelynn Benning,CMA  

## 2014-09-20 NOTE — Telephone Encounter (Signed)
Ok referral placed.  Hilton Sinclair, MD

## 2014-09-20 NOTE — Telephone Encounter (Signed)
Seen most recently 07/03/13 by Dr Mercy Moore at [facility]. Does she still need another referral there or can she just call them?  Connie Sinclair, MD

## 2014-09-20 NOTE — Telephone Encounter (Signed)
Pt called and would like a referral to [facility]. jw

## 2014-09-20 NOTE — Telephone Encounter (Signed)
Pt has [facility] and will need a new referral placed. [name],CMA

## 2014-10-07 ENCOUNTER — Encounter (HOSPITAL_COMMUNITY): Payer: Self-pay | Admitting: Orthopedic Surgery

## 2014-11-02 DIAGNOSIS — N184 Chronic kidney disease, stage 4 (severe): Secondary | ICD-10-CM | POA: Diagnosis not present

## 2014-11-02 DIAGNOSIS — I129 Hypertensive chronic kidney disease with stage 1 through stage 4 chronic kidney disease, or unspecified chronic kidney disease: Secondary | ICD-10-CM | POA: Diagnosis not present

## 2014-11-02 DIAGNOSIS — D631 Anemia in chronic kidney disease: Secondary | ICD-10-CM | POA: Diagnosis not present

## 2014-11-02 DIAGNOSIS — E1129 Type 2 diabetes mellitus with other diabetic kidney complication: Secondary | ICD-10-CM | POA: Diagnosis not present

## 2014-11-04 ENCOUNTER — Telehealth: Payer: Self-pay | Admitting: Cardiology

## 2014-11-04 NOTE — Telephone Encounter (Signed)
Received records from [facility] for appointment with Dr Stanford Breed on 01/10/15.  Records given to [facility] (medical records) for Dr Jacalyn Lefevre schedule on 01/10/15. lp

## 2014-11-08 ENCOUNTER — Ambulatory Visit: Payer: Commercial Managed Care - HMO | Admitting: Family Medicine

## 2014-11-25 ENCOUNTER — Encounter: Payer: Self-pay | Admitting: Family Medicine

## 2014-11-25 ENCOUNTER — Ambulatory Visit (INDEPENDENT_AMBULATORY_CARE_PROVIDER_SITE_OTHER): Payer: Commercial Managed Care - HMO | Admitting: Family Medicine

## 2014-11-25 VITALS — BP 143/66 | HR 60 | Temp 98.3°F | Ht 67.0 in | Wt 235.0 lb

## 2014-11-25 DIAGNOSIS — L97509 Non-pressure chronic ulcer of other part of unspecified foot with unspecified severity: Secondary | ICD-10-CM | POA: Insufficient documentation

## 2014-11-25 DIAGNOSIS — L97511 Non-pressure chronic ulcer of other part of right foot limited to breakdown of skin: Secondary | ICD-10-CM

## 2014-11-25 DIAGNOSIS — E1142 Type 2 diabetes mellitus with diabetic polyneuropathy: Secondary | ICD-10-CM

## 2014-11-25 DIAGNOSIS — L84 Corns and callosities: Secondary | ICD-10-CM | POA: Diagnosis not present

## 2014-11-25 DIAGNOSIS — M546 Pain in thoracic spine: Secondary | ICD-10-CM

## 2014-11-25 DIAGNOSIS — E119 Type 2 diabetes mellitus without complications: Secondary | ICD-10-CM | POA: Diagnosis not present

## 2014-11-25 DIAGNOSIS — M549 Dorsalgia, unspecified: Secondary | ICD-10-CM | POA: Insufficient documentation

## 2014-11-25 LAB — POCT GLYCOSYLATED HEMOGLOBIN (HGB A1C): HEMOGLOBIN A1C: 6.8

## 2014-11-25 NOTE — Assessment & Plan Note (Addendum)
Mid thoracic back pain that occasionally radiates into right neck and occasionally into right side. Most likely a trapezius muscle spasm. No red flag symptoms. -Heat, stretching, and massage with icy hot. -Follow-up in one month if no improvement. Could consider trigger point injection.

## 2014-11-25 NOTE — Assessment & Plan Note (Addendum)
Plantar surface of right foot with callus formation under third to fourth MTP with central avulsion and mild ulceration, no erythema or purulence. No fevers or chills. Likely predisposed due to diabetic neuropathy. -Callus shaved per procedure note to allow improved healing. Patient tolerated well. -Recommended Dr. Felicie Morn doughnut corn to offload this area. -Follow-up in one week for reevaluation, and seek immediate care if any signs of infection develop. -Discussed looking at foot daily. -Precepted with Dr. Andria Frames.

## 2014-11-25 NOTE — Patient Instructions (Addendum)
It was good to see you today.  Your back pain is probably from a trapezius muscle spasm. Best treatment for this is heat, massage, and slow steady stretching. If it is not better in 2-3 weeks, come see me again. At that time we can consider trigger point injection.  Your right foot had an ulcer on it that was probably caused from a callus. We have trimmed the callus today. Make sure that you purchased a corn doughnut to put in your shoe to offload this area. I have also referred you for podiatry. You should receive a phone call about an appointment.  I would like to check the foot again in about 1 week. Seek immediate care before this if you notice redness, pus, heat, fevers, or chills. Check your feet daily.  Follow up when convenient to discuss diabetes.  Hilton Sinclair, MD

## 2014-11-25 NOTE — Progress Notes (Signed)
Patient ID: [name], female   DOB: [birth year], 59 y.o.   MRN: [id number] Subjective:   CC: Back pain, right foot issue   HPI:   Back pain patient presents for evaluation of right mid back pain present for about one month. Pain is random with no obvious trigger or trauma or injury. It has been worsening. It is intermittent and ranges from tolerable to very bad. It is more severe first thing in the morning, if she has been laying a while, or if she stands too long. It is sharp and can radiate into neck or right side. Sometimes it causes her a posterior headache. She denies any bowel or bladder incontinence, numbness or tingling in perineum, or leg weakness. She has tried Tylenol and muscle rubs which helped for a few minutes. No position changes worsen symptoms. She denies chest pain or shortness of breath.   Right foot issue  patient reports that about 4 weeks ago she peeled some dry skin off of her right foot. Then about one week later, her son noticed a dark spot on the underside of her right foot where she peeled the skin. She has bad peripheral neuropathy and feels no pain. She used some antibiotic cream on the bottom of her foot. She denies fevers, chills, purulence, or warmth. Right leg is chronically swollen given ankle fracture history.   Review of Systems - Per HPI.   Past medical history:  paroxysmal atrial fibrillation, hypertension, diabetes, diabetic neuropathy and retinopathy, CAD stage IV, and STEMI history, CAD, hyperlipidemia, vitamin D deficiency   Objective:  Physical Exam BP 143/66 mmHg  Pulse 60  Temp(Src) 98.3 F (36.8 C) (Oral)  Ht 5\' 7"  (1.702 m)  Wt 235 lb (106.595 kg)  BMI 36.80 kg/m2 GEN: [name] Extremities:  Right plantar surface of the forefoot under MTP joints of third and fourth digits with 2 cm diameter thick callus with dark eschar in center, no purulence,  nontender, nonerythematous, no deformity left lateral forefoot with bony prominence that  is nontender, not warm, not erythematous, but with some hyperpigmented bruising Back: Mild tenderness in trapezius distribution on right, no erythema, no deformity     Procedure - shaving of right foot callus Thick callus shaved on right plantar surface of foot until softer new skin palpated underneath, no bleeding, patient tolerated procedure well  Assessment:     [name] is a 59 y.o. female here for  back pain, right foot issue    Plan:     # See problem list and after visit summary for problem-specific plans. - A1c obtained today by nursing staff. Follow-up when convenient to discuss.  - Patient dropped off form for student loans that she would like me to fill out. Will fill out and provide at next visit.  Follow-up: Follow up in one week  for follow-up of foot ulcer.  Hilton Sinclair, MD [place]

## 2014-11-25 NOTE — Assessment & Plan Note (Signed)
Left forefoot bony prominence predisposing this area to potential pressure ulcer in a uncontrolled diabetic with neuropathy. -Referral placed for podiatry -Urged patient to discuss with them different shoe options including diabetic shoes to protect feet.

## 2014-11-26 ENCOUNTER — Encounter: Payer: Self-pay | Admitting: Family Medicine

## 2014-11-26 DIAGNOSIS — I129 Hypertensive chronic kidney disease with stage 1 through stage 4 chronic kidney disease, or unspecified chronic kidney disease: Secondary | ICD-10-CM | POA: Diagnosis not present

## 2014-11-26 DIAGNOSIS — N189 Chronic kidney disease, unspecified: Secondary | ICD-10-CM | POA: Diagnosis not present

## 2014-12-03 ENCOUNTER — Other Ambulatory Visit (HOSPITAL_COMMUNITY): Payer: Self-pay | Admitting: *Deleted

## 2014-12-06 ENCOUNTER — Inpatient Hospital Stay (HOSPITAL_COMMUNITY): Admission: RE | Admit: 2014-12-06 | Payer: Medicare HMO | Source: Ambulatory Visit

## 2014-12-09 DIAGNOSIS — N189 Chronic kidney disease, unspecified: Secondary | ICD-10-CM | POA: Diagnosis not present

## 2014-12-14 ENCOUNTER — Other Ambulatory Visit: Payer: Self-pay | Admitting: Family Medicine

## 2014-12-24 ENCOUNTER — Telehealth: Payer: Self-pay | Admitting: Family Medicine

## 2014-12-24 NOTE — Telephone Encounter (Signed)
Please call patient to let her know that I looked at the form she had given me at our last appt and it looks like it is asking if she is disabled.   Please ask if she has received disability status and if not, I will be unable to fill out the form (disability office would need to make that determination). If she has, then she needs to bring in the paperwork from that determination to an office visit with me so I can know how they ruled her disability and then fill out the form.  Thank you,  Hilton Sinclair, MD

## 2014-12-27 NOTE — Telephone Encounter (Signed)
Pt states that we can records from Dr. Jimmye Norman from [facility] and eye.  Has been disabled since 2014 and it is from her vision.  Right eye is 20/100 and 20/70 in her left eye.  Pt doesn't have any recent paperwork regarding this. Jazmin Hartsell,CMA

## 2014-12-28 ENCOUNTER — Telehealth: Payer: Self-pay | Admitting: Family Medicine

## 2014-12-28 NOTE — Telephone Encounter (Signed)
Referral authorization received.  Auth# [id number] approved for 6 visits for 6 months. Jazmin Hartsell,CMA

## 2014-12-28 NOTE — Telephone Encounter (Signed)
[facility] is aware of this. Jazmin Hartsell,CMA

## 2014-12-28 NOTE — Telephone Encounter (Signed)
[facility] called and is needing the silverback authorization number to see the patient. She has an appointment on 12/29/14 at 1:30. jw

## 2014-12-29 ENCOUNTER — Ambulatory Visit (INDEPENDENT_AMBULATORY_CARE_PROVIDER_SITE_OTHER): Payer: Commercial Managed Care - HMO | Admitting: Podiatry

## 2014-12-29 ENCOUNTER — Encounter: Payer: Self-pay | Admitting: Podiatry

## 2014-12-29 VITALS — BP 145/70 | HR 57 | Resp 18

## 2014-12-29 DIAGNOSIS — B351 Tinea unguium: Secondary | ICD-10-CM

## 2014-12-29 DIAGNOSIS — Q828 Other specified congenital malformations of skin: Secondary | ICD-10-CM

## 2014-12-29 DIAGNOSIS — E1140 Type 2 diabetes mellitus with diabetic neuropathy, unspecified: Secondary | ICD-10-CM | POA: Diagnosis not present

## 2014-12-29 DIAGNOSIS — E1149 Type 2 diabetes mellitus with other diabetic neurological complication: Secondary | ICD-10-CM

## 2014-12-29 DIAGNOSIS — M79676 Pain in unspecified toe(s): Secondary | ICD-10-CM | POA: Diagnosis not present

## 2014-12-29 NOTE — Progress Notes (Signed)
   Subjective:    Patient ID: [name], female    DOB: 1955/11/29, 59 y.o.   MRN: [id number]  HPI  59 year old female presents the office today for painful, elongated toenails. She also states that she has a callus on her right foot which is painful with shoe gear. She denies any redness or drainage from the nail sites or along the callus. She is been diabetic for approximate 19 years. She states that her son typically trims her toenails. No other complaints at this time.    Review of Systems  Constitutional: Positive for fatigue.  Eyes: Positive for visual disturbance.  Gastrointestinal: Positive for nausea and constipation.  Endocrine:       INCREASE URINATION  Musculoskeletal: Positive for gait problem.  Allergic/Immunologic: Positive for food allergies.  Neurological: Positive for numbness.  Hematological:       SLOW TO HEAL  All other systems reviewed and are negative.      Objective:   Physical Exam AAO 3, NAD DP/PT pulses palpable, CRT less than 3 seconds Protective sensation decreased with Zelphia [place], decreased vibratory sensation, Achilles tendon reflex intact. Nails are hypertrophic, dystrophic, elongated, brittle, discolored 10. There is no surrounding erythema or drainage on the nail sites. There is tenderness on nails 1-5 bilaterally. No open lesions. There is a hyperkeratotic lesion right foot submetatarsal 3. Upon debridement Skin is intact and there is no drainage, erythema, clinical signs of infection. No other pre-ulcer lesions identified bilaterally. There is prominent fifth metatarsal base on the left side. No areas of tenderness to bilateral lower extremities. No overlying edema, erythema, increase in warmth. No pain with calf compression, swelling, warmth, erythema.     Assessment & Plan:  59 year old female with symptoms on the onychomycosis, hyperkeratotic lesion, neuropathy -Treatment options were discussed the patient including  all alternatives, risks, complications. -Nails are sharply debrided 10 without complications/bleeding. -Hyperkeratotic lesion sharply debrided without complication/bleeding. -Given pre-ulcerative sites and neuropathy I do believe the patient would benefit from diabetic shoes. Paperwork was completed for precertification. -Discussed the importance of daily foot inspection -Follow-up in 3 months or sooner if any problems are to arise. Call any questions or concerns. Follow-up with PCP for other issues mentioned in the ROS.

## 2014-12-29 NOTE — Patient Instructions (Signed)
Diabetes and Foot Care Diabetes may cause you to have problems because of poor blood supply (circulation) to your feet and legs. This may cause the skin on your feet to become thinner, break easier, and heal more slowly. Your skin may become dry, and the skin may peel and crack. You may also have nerve damage in your legs and feet causing decreased feeling in them. You may not notice minor injuries to your feet that could lead to infections or more serious problems. Taking care of your feet is one of the most important things you can do for yourself.  HOME CARE INSTRUCTIONS  Wear shoes at all times, even in the house. Do not go barefoot. Bare feet are easily injured.  Check your feet daily for blisters, cuts, and redness. If you cannot see the bottom of your feet, use a mirror or ask someone for help.  Wash your feet with warm water (do not use hot water) and mild soap. Then pat your feet and the areas between your toes until they are completely dry. Do not soak your feet as this can dry your skin.  Apply a moisturizing lotion or petroleum jelly (that does not contain alcohol and is unscented) to the skin on your feet and to dry, brittle toenails. Do not apply lotion between your toes.  Trim your toenails straight across. Do not dig under them or around the cuticle. File the edges of your nails with an emery board or nail file.  Do not cut corns or calluses or try to remove them with medicine.  Wear clean socks or stockings every day. Make sure they are not too tight. Do not wear knee-high stockings since they may decrease blood flow to your legs.  Wear shoes that fit properly and have enough cushioning. To break in new shoes, wear them for just a few hours a day. This prevents you from injuring your feet. Always look in your shoes before you put them on to be sure there are no objects inside.  Do not cross your legs. This may decrease the blood flow to your feet.  If you find a minor scrape,  cut, or break in the skin on your feet, keep it and the skin around it clean and dry. These areas may be cleansed with mild soap and water. Do not cleanse the area with peroxide, alcohol, or iodine.  When you remove an adhesive bandage, be sure not to damage the skin around it.  If you have a wound, look at it several times a day to make sure it is healing.  Do not use heating pads or hot water bottles. They may burn your skin. If you have lost feeling in your feet or legs, you may not know it is happening until it is too late.  Make sure your health care provider performs a complete foot exam at least annually or more often if you have foot problems. Report any cuts, sores, or bruises to your health care provider immediately. SEEK MEDICAL CARE IF:   You have an injury that is not healing.  You have cuts or breaks in the skin.  You have an ingrown nail.  You notice redness on your legs or feet.  You feel burning or tingling in your legs or feet.  You have pain or cramps in your legs and feet.  Your legs or feet are numb.  Your feet always feel cold. SEEK IMMEDIATE MEDICAL CARE IF:   There is increasing redness,   swelling, or pain in or around a wound.  There is a red line that goes up your leg.  Pus is coming from a wound.  You develop a fever or as directed by your health care provider.  You notice a bad smell coming from an ulcer or wound. Document Released: 09/07/2000 Document Revised: 05/13/2013 Document Reviewed: 02/17/2013 Exit[employer] Patient Information 2015 [employer]. This information is not intended to replace advice given to you by your health care provider. Make sure you discuss any questions you have with your health care provider.  

## 2015-01-03 ENCOUNTER — Telehealth: Payer: Self-pay | Admitting: Family Medicine

## 2015-01-03 NOTE — Telephone Encounter (Signed)
Spoke with patient and she is aware that she needs an appt.  Will call back by Thursday to see if she will have money for her visit. Adysen Raphael,CMA

## 2015-01-03 NOTE — Telephone Encounter (Signed)
Spoke with [name]. Silverback [name] is [id number] 6 visits. 01/03/15 to 07/02/15

## 2015-01-03 NOTE — Telephone Encounter (Signed)
Kim from [facility] called and needs a silverback for the patients up coming visit. Please call her at [phone] ext [phone]. jw

## 2015-01-03 NOTE — Telephone Encounter (Signed)
LM for patient to call back.  Please give message from MD and help her make an appt to go over these forms. Jazmin Hartsell,CMA

## 2015-01-03 NOTE — Telephone Encounter (Signed)
OK I can fill this out for her in that case - please have her make an appointment so I can go over the form with her and also have her bring in / request paperwork sent to [place] by the appointement from [facility] and bring in anything from Medicare/DSS stating her disability.  Thank you,  Hilton Sinclair, MD

## 2015-01-03 NOTE — Telephone Encounter (Signed)
Pt is returning our call. jw

## 2015-01-05 NOTE — Progress Notes (Signed)
HPI: FU coronary artery disease and atrial fibrillation. Patient was admitted in April of 2014 following a syncopal episode. She fractured her fibula which required surgical intervention. She was noted to be in atrial fibrillation with a rapid ventricular response at the time of admission. She converted spontaneously to sinus rhythm. She also ruled in for a non-ST elevation myocardial infarction. Cardiac catheterization in April of 2014 revealed normal left main, 40% mid LAD and 30% distal. 80% proximal circumflex and 30% right coronary artery. It was felt medical therapy indicated unless she had recurrent symptoms of chest pain and PCI could be considered at that time. Echocardiogram in April of 2014 showed normal LV function, mild left ventricular hypertrophy and mild left atrial enlargement. Carotid Dopplers in April of 2014 showed no significant stenosis. Since she was last seen, she denies dyspnea, chest pain. One week ago she took a pain pill and developed nausea. She stood and was walking to the bathroom and then had syncope while nauseated. She did not injure herself by her report.  Current Outpatient Prescriptions  Medication Sig Dispense Refill  . ACCU-CHEK AVIVA PLUS test strip USE WITH DAILY FASTING BLOOD SUGAR CHECK 100 each 3  . atorvastatin (LIPITOR) 80 MG tablet Take 1 tablet (80 mg total) by mouth at bedtime. 90 tablet 3  . Blood Glucose Monitoring Suppl (ACCU-CHEK AVIVA PLUS) W/DEVICE KIT Please provide meter and test strp for 1 month 1 kit 0  . calcitRIOL (ROCALTROL) 0.25 MCG capsule     . carvedilol (COREG) 12.5 MG tablet TAKE ONE TABLET BY MOUTH TWICE DAILY WITH  FOOD 180 tablet 1  . cetirizine (ZYRTEC) 10 MG tablet Take 1 tablet (10 mg total) by mouth daily. 30 tablet 11  . diltiazem (DILACOR XR) 240 MG 24 hr capsule TAKE ONE CAPSULE BY MOUTH ONCE DAILY 90 capsule 1  . ELIQUIS 5 MG TABS tablet TAKE 1 TABLET TWICE DAILY 180 tablet 2  . ferrous sulfate 325 (65 FE) MG tablet  Take 1 tablet (325 mg total) by mouth daily with breakfast. 30 tablet 3  . fluticasone (FLONASE) 50 MCG/ACT nasal spray Place 2 sprays into both nostrils daily. 16 g 6  . furosemide (LASIX) 40 MG tablet Take 1 tablet (40 mg total) by mouth daily. Needs PCP appointment. 30 tablet 5  . hydrALAZINE (APRESOLINE) 100 MG tablet Take 100 mg by mouth 2 (two) times daily.     . hydrALAZINE (APRESOLINE) 50 MG tablet Take 1 tablet (50 mg total) by mouth 3 (three) times daily. 90 tablet 5  . insulin glargine (LANTUS) 100 UNIT/ML injection Inject 0.17 mLs (17 Units total) into the skin daily. 10 mL 5  . insulin regular (NOVOLIN R) 100 units/mL injection INJECT 5 TO 10 UNITS INTO THE SKIN THREE TIMES DAILY BEFORE MEALS. SLIDING SCALE OF INSULIN DOSAGE 60 mL 5  . Insulin Syringes, Disposable, U-100 0.5 ML MISC 4x daily injections 100 each 8  . isosorbide mononitrate (IMDUR) 30 MG 24 hr tablet Take 1 tablet (30 mg total) by mouth daily. 30 tablet 5  . Lancet Devices (ACCU-CHEK SOFTCLIX) lancets Fill for 1 month for TID testing. Use as instructed 90 each 0  . Multiple Vitamin (MULTIVITAMIN WITH MINERALS) TABS Take 1 tablet by mouth daily.    Marland Kitchen NEEDLE, DISP, 30 G (BD DISP NEEDLES) 30G X 1/2" MISC For 4x daily injections 100 each 13  . nitroGLYCERIN (NITROSTAT) 0.4 MG SL tablet Place 1 tablet (0.4 mg total) under the  tongue every 5 (five) minutes as needed for chest pain. 30 tablet 0  . pantoprazole (PROTONIX) 40 MG tablet Take 1 tablet (40 mg total) by mouth daily. Needs MD eval. 30 tablet 0  . PROCRIT 82505 UNIT/ML injection     . traMADol (ULTRAM) 50 MG tablet Take 1 tablet (50 mg total) by mouth every 6 (six) hours as needed for pain. Maximum dose= 8 tablets per day 60 tablet 0  . valsartan (DIOVAN) 80 MG tablet TAKE 1 TABLET EVERY DAY  (NEED PRIMARY CARE PHYSICIAN APPOINTMENT) 90 tablet 0  . Vitamin D, Ergocalciferol, (DRISDOL) 50000 UNITS CAPS capsule Take 1 capsule (50,000 Units total) by mouth every 7 (seven)  days. (wednesdays). Need recheck level end of May with PCP. 7 capsule 0  . zoster vaccine live, PF, (ZOSTAVAX) 39767 UNT/0.65ML injection Inject 19,400 Units into the skin once. 1 each 0   No current facility-administered medications for this visit.     Past Medical History  Diagnosis Date  . Hypertension   . Diabetes mellitus   . Myocardial infarction     in April 2014  . Chronic kidney disease, stage III (moderate)   . Constipation   . Anemia   . History of blood transfusion   . Atrial fibrillation   . CAD (coronary artery disease)     Past Surgical History  Procedure Laterality Date  . Laser surgery for diabetic retinopathy    . Abdominal hysterectomy    . Orif ankle fracture Right 01/12/2013    Procedure: OPEN REDUCTION INTERNAL FIXATION (ORIF) ANKLE FRACTURE;  Surgeon: Newt Minion, MD;  Location: [place];  Service: Orthopedics;  Laterality: Right;  . Eye surgery    . Hardware removal Right 02/13/2013    Procedure: HARDWARE REMOVAL;  Surgeon: Newt Minion, MD;  Location: [place];  Service: Orthopedics;  Laterality: Right;  Right Ankle Removal Hardware, Debridement, Place Wound VAC  . Left heart catheterization with coronary angiogram N/A 01/09/2013    Procedure: LEFT HEART CATHETERIZATION WITH CORONARY ANGIOGRAM;  Surgeon: Jolaine Artist, MD;  Location: [facility] CATH LAB;  Service: Cardiovascular;  Laterality: N/A;    History   Social History  . Marital Status: Legally Separated    Spouse Name: N/A  . Number of Children: N/A  . Years of Education: N/A   Occupational History  . Disabled    Social History Main Topics  . Smoking status: Former Research scientist (life sciences)  . Smokeless tobacco: Not on file  . Alcohol Use: No  . Drug Use: No  . Sexual Activity: Not on file   Other Topics Concern  . Not on file   Social History Narrative   Patient lives with her son..    ROS: no fevers or chills, productive cough, hemoptysis, dysphasia, odynophagia, melena, hematochezia, dysuria,  hematuria, rash, seizure activity, orthopnea, PND, pedal edema, claudication. Remaining systems are negative.  Physical Exam: Well-developed well-nourished in no acute distress.  Skin is warm and dry.  HEENT is normal.  Neck is supple.  Chest is clear to auscultation with normal expansion.  Cardiovascular exam is regular rate and rhythm.  Abdominal exam nontender or distended. No masses palpated. Extremities show no edema. neuro grossly intact  ECG sinus rhythm, poor R-wave progression.

## 2015-01-10 ENCOUNTER — Ambulatory Visit (INDEPENDENT_AMBULATORY_CARE_PROVIDER_SITE_OTHER): Payer: Commercial Managed Care - HMO | Admitting: Cardiology

## 2015-01-10 ENCOUNTER — Encounter: Payer: Self-pay | Admitting: Cardiology

## 2015-01-10 VITALS — BP 144/80 | HR 65 | Ht 67.0 in | Wt 230.0 lb

## 2015-01-10 DIAGNOSIS — I10 Essential (primary) hypertension: Secondary | ICD-10-CM

## 2015-01-10 DIAGNOSIS — I2510 Atherosclerotic heart disease of native coronary artery without angina pectoris: Secondary | ICD-10-CM

## 2015-01-10 DIAGNOSIS — R55 Syncope and collapse: Secondary | ICD-10-CM

## 2015-01-10 DIAGNOSIS — E785 Hyperlipidemia, unspecified: Secondary | ICD-10-CM

## 2015-01-10 DIAGNOSIS — I2583 Coronary atherosclerosis due to lipid rich plaque: Secondary | ICD-10-CM

## 2015-01-10 DIAGNOSIS — N184 Chronic kidney disease, stage 4 (severe): Secondary | ICD-10-CM | POA: Diagnosis not present

## 2015-01-10 DIAGNOSIS — I4891 Unspecified atrial fibrillation: Secondary | ICD-10-CM | POA: Diagnosis not present

## 2015-01-10 LAB — CBC
HCT: 31.2 % — ABNORMAL LOW (ref 36.0–46.0)
Hemoglobin: 9.5 g/dL — ABNORMAL LOW (ref 12.0–15.0)
MCH: 24.6 pg — AB (ref 26.0–34.0)
MCHC: 30.4 g/dL (ref 30.0–36.0)
MCV: 80.8 fL (ref 78.0–100.0)
MPV: 10.5 fL (ref 8.6–12.4)
PLATELETS: 390 10*3/uL (ref 150–400)
RBC: 3.86 MIL/uL — AB (ref 3.87–5.11)
RDW: 16.1 % — AB (ref 11.5–15.5)
WBC: 6.7 10*3/uL (ref 4.0–10.5)

## 2015-01-10 NOTE — Assessment & Plan Note (Signed)
Continue statin. 

## 2015-01-10 NOTE — Assessment & Plan Note (Signed)
Blood pressure borderline. I have asked her to follow this at home and we will advance medications as needed.

## 2015-01-10 NOTE — Patient Instructions (Signed)
Your physician wants you to follow-up in: 6 MONTHS WITH DR CRENSHAW You will receive a reminder letter in the mail two months in advance. If you don't receive a letter, please call our office to schedule the follow-up appointment.   Your physician recommends that you HAVE LAB WORK TODAY 

## 2015-01-10 NOTE — Assessment & Plan Note (Signed)
Continue statin. Not on aspirin given need for anticoagulation. 

## 2015-01-10 NOTE — Assessment & Plan Note (Signed)
Patient remains in sinus rhythm. Continue carvedilol and Cardizem. Continue apixaban. Check hemoglobin and renal function.

## 2015-01-10 NOTE — Assessment & Plan Note (Signed)
Followed by nephrology. 

## 2015-01-10 NOTE — Assessment & Plan Note (Signed)
Patient had another syncopal episode. It sounds as though she had a vagal episode while being nauseated. LV function normal. Will not pursue further evaluation at this point. She does not drive.

## 2015-01-11 ENCOUNTER — Encounter: Payer: Self-pay | Admitting: Cardiology

## 2015-01-11 LAB — BASIC METABOLIC PANEL WITH GFR
BUN: 62 mg/dL — AB (ref 6–23)
CALCIUM: 8.6 mg/dL (ref 8.4–10.5)
CO2: 21 meq/L (ref 19–32)
CREATININE: 3.53 mg/dL — AB (ref 0.50–1.10)
Chloride: 104 mEq/L (ref 96–112)
GFR, Est African American: 16 mL/min — ABNORMAL LOW
GFR, Est Non African American: 13 mL/min — ABNORMAL LOW
GLUCOSE: 90 mg/dL (ref 70–99)
Potassium: 5 mEq/L (ref 3.5–5.3)
Sodium: 137 mEq/L (ref 135–145)

## 2015-01-11 NOTE — Progress Notes (Unsigned)
GFR 28; change apixaban to 2.5 mg po BID [name]

## 2015-01-12 ENCOUNTER — Other Ambulatory Visit: Payer: Self-pay | Admitting: *Deleted

## 2015-01-12 MED ORDER — APIXABAN 5 MG PO TABS: 2.5000 mg | ORAL_TABLET | Freq: Two times a day (BID) | ORAL | Status: DC

## 2015-02-02 ENCOUNTER — Other Ambulatory Visit: Payer: Self-pay | Admitting: Family Medicine

## 2015-03-01 ENCOUNTER — Other Ambulatory Visit: Payer: Self-pay | Admitting: Family Medicine

## 2015-03-02 NOTE — Telephone Encounter (Signed)
Pt is aware of this.  She wanted to also make you aware that she signed a release of information at Dr. Zigmund Daniel office for you to fill out that form for her.  Almir Botts,CMA

## 2015-03-02 NOTE — Telephone Encounter (Signed)
Refusing valsartan for now. Please call patient to let her know that since creatinine (checked recently at cardiology) was elevated from her baseline, she should hold off on valsartan and discuss with her kidney doctor. Creatinine needs to be rechecked as well, if they have not already done so.  Hilton Sinclair, MD

## 2015-03-24 NOTE — Telephone Encounter (Signed)
Pt is aware.  She will contact Dr. Zigmund Daniel office to check on status. Walburga Hudman,CMA

## 2015-03-24 NOTE — Telephone Encounter (Signed)
Letter mailed to patient. Jazmin Hartsell,CMA  

## 2015-03-24 NOTE — Telephone Encounter (Signed)
Just following up on this - I never got the inforamtion from Dr Zigmund Daniel office so could not fill out the paperwork and today is my last day. Please let her know. She can certainly discuss with her new PCP. It has been a privilege being her doctor.  Connie Sinclair, MD

## 2015-04-07 ENCOUNTER — Other Ambulatory Visit: Payer: Self-pay | Admitting: Family Medicine

## 2015-04-08 ENCOUNTER — Ambulatory Visit: Payer: Commercial Managed Care - HMO | Admitting: Podiatry

## 2015-04-12 ENCOUNTER — Ambulatory Visit: Payer: Commercial Managed Care - HMO | Admitting: Obstetrics and Gynecology

## 2015-04-22 DIAGNOSIS — D631 Anemia in chronic kidney disease: Secondary | ICD-10-CM | POA: Diagnosis not present

## 2015-04-22 DIAGNOSIS — E1129 Type 2 diabetes mellitus with other diabetic kidney complication: Secondary | ICD-10-CM | POA: Diagnosis not present

## 2015-04-22 DIAGNOSIS — I129 Hypertensive chronic kidney disease with stage 1 through stage 4 chronic kidney disease, or unspecified chronic kidney disease: Secondary | ICD-10-CM | POA: Diagnosis not present

## 2015-04-22 DIAGNOSIS — N2581 Secondary hyperparathyroidism of renal origin: Secondary | ICD-10-CM | POA: Diagnosis not present

## 2015-04-22 DIAGNOSIS — N184 Chronic kidney disease, stage 4 (severe): Secondary | ICD-10-CM | POA: Diagnosis not present

## 2015-04-29 ENCOUNTER — Ambulatory Visit: Payer: Commercial Managed Care - HMO | Admitting: Podiatry

## 2015-05-04 ENCOUNTER — Telehealth: Payer: Self-pay | Admitting: Cardiology

## 2015-05-04 ENCOUNTER — Other Ambulatory Visit: Payer: Self-pay

## 2015-05-04 MED ORDER — CARVEDILOL 12.5 MG PO TABS: 12.5000 mg | ORAL_TABLET | Freq: Two times a day (BID) | ORAL | Status: DC

## 2015-05-04 NOTE — Telephone Encounter (Signed)
Pt has appt 06-19-15 to meet new dr and get med refills Refills requested from [facility] ( carvedilol and DILT ) have not been refilled  Please advise

## 2015-05-04 NOTE — Telephone Encounter (Signed)
°  1. Which medications need to be refilled? Carvedilol and Diltiazem 2. Which pharmacy is medication to be sent to? Both of them to [facility] and Carvedilol to [facility] 3. Do they need a 30 day or 90 day supply? 90 and refills for both at [place] for Carvedilol at [facility] her mail order comes in  4. Would they like a call back once the medication has been sent to the pharmacy? yes

## 2015-05-13 ENCOUNTER — Other Ambulatory Visit: Payer: Self-pay | Admitting: Family Medicine

## 2015-05-26 ENCOUNTER — Telehealth: Payer: Self-pay | Admitting: Obstetrics and Gynecology

## 2015-05-26 DIAGNOSIS — E118 Type 2 diabetes mellitus with unspecified complications: Secondary | ICD-10-CM

## 2015-05-26 NOTE — Telephone Encounter (Signed)
Need referral to [facility] practice.  Call to inform when appt scheduled.

## 2015-05-30 NOTE — Telephone Encounter (Signed)
Referral made 

## 2015-06-17 ENCOUNTER — Ambulatory Visit (INDEPENDENT_AMBULATORY_CARE_PROVIDER_SITE_OTHER): Payer: Commercial Managed Care - HMO | Admitting: Obstetrics and Gynecology

## 2015-06-17 ENCOUNTER — Encounter: Payer: Self-pay | Admitting: Obstetrics and Gynecology

## 2015-06-17 VITALS — BP 148/70 | HR 60 | Temp 97.8°F | Ht 67.0 in | Wt 227.0 lb

## 2015-06-17 DIAGNOSIS — E1142 Type 2 diabetes mellitus with diabetic polyneuropathy: Secondary | ICD-10-CM

## 2015-06-17 DIAGNOSIS — I10 Essential (primary) hypertension: Secondary | ICD-10-CM | POA: Diagnosis not present

## 2015-06-17 DIAGNOSIS — E119 Type 2 diabetes mellitus without complications: Secondary | ICD-10-CM

## 2015-06-17 DIAGNOSIS — I890 Lymphedema, not elsewhere classified: Secondary | ICD-10-CM

## 2015-06-17 DIAGNOSIS — Z0000 Encounter for general adult medical examination without abnormal findings: Secondary | ICD-10-CM

## 2015-06-17 DIAGNOSIS — Z23 Encounter for immunization: Secondary | ICD-10-CM

## 2015-06-17 LAB — POCT GLYCOSYLATED HEMOGLOBIN (HGB A1C): HEMOGLOBIN A1C: 6.9

## 2015-06-17 NOTE — Patient Instructions (Signed)
You received the flu shot today. Your arm may be sore for a day or two  Continue to monitor your sugars at home at least once a day  Follow-up with me as needed or in the next 6 months  Lymphedema Lymphedema is a swelling caused by the abnormal collection of lymph under the skin. The lymph is fluid from the tissues in your body that travels in the lymphatic system. This system is part of the immune system that includes lymph nodes and vessels. The lymph vessels collect and carry the excess fluid, fats, proteins, and wastes from the tissues of the body to the bloodstream. This system also works to clean and remove bacteria and waste products from the body.  Lymphedema occurs when the lymphatic system is blocked. When the lymph vessels or lymph nodes are blocked or damaged, lymph does not drain properly. This causes abnormal build up of lymph. This leads to swelling in the arms or legs. Lymphedema cannot be cured by medicines. But the swelling can be reduced by physical methods. CAUSES  There are two types of lymphedema. Primary lymphedema is caused by the absence or abnormality of the lymph vessel at birth. It is also known as inherited lymphedema, which occurs rarely. Secondary or acquired lymphedema occurs when the lymph vessel is damaged or blocked. The causes of lymph vessel blockage are:   Skin infection like cellulites.  Infection by parasites (filariasis).  Injury.  Cancer.  Radiation therapy.  Formation of scar tissue.  Surgery. SYMPTOMS  The symptoms of lymphedema are:  Abnormal swelling of the arm or leg.  Heavy or tight feeling in your arm or leg.  Tight-fitting shoes or rings.  Redness of skin over the affected area.  Limited movement of the affected limb.  Some patients complain about sensitivity to touch and discomfort in the limb(s) affected. You may not have these symptoms immediately following injury. They usually appear within a few days or even years after  injury. Inform your caregiver, if you have any of these symptoms. Early treatment can avoid further problems.  DIAGNOSIS  First, your caregiver will inquire about any surgery you have had or medicines you are taking. He will then examine you. Your caregiver may order special imaging tests, such as:  Lymphoscintigraphy (a test in which a low dose of radioactive substance is injected to trace the flow of lymph through the lymph vessels).  MRI (imaging tests using magnetic fields).  Computed tomography (test using special cross-sectional X-rays).  Duplex ultrasound (test using high-frequency sound waves to show the vessels and the blood flow on a screen).  Lymphangiography (special X-ray taken after injecting a contrast dye into the lymph vessel). It is now rarely done. TREATMENT  Lymphedema can be treated in different ways. Your caregiver will decide the type of treatment depending on the cause. Treatment may include:  Exercise: Special exercises will help fluid move out easily from the affected part. This should be done as per your caregiver's advice.  Manual lymph drainage: Gentle massage of the affected limb makes the fluid to move out more freely.  Compression: Compression stockings or external pump apply pressure over the affected limb. This helps the fluid to move out from the arm or leg. Bandaging can also help to move the fluid out from the affected part. Your caregiver will decide the method that suits you the best.  Medicines: Your caregiver may prescribe antibiotics, if you have infection.  Surgery: Your caregiver may advise surgery for severe  lymphedema. It is reserved for special cases when the patient has difficulty moving. Your surgeon may remove excess tissue from the arm or leg. This will help to ease your movement. Physical therapy may have to be continued after surgery. HOME CARE INSTRUCTIONS  The area is very fragile and is predisposed to injury and infection.  Eat a  healthy diet.  Exercise regularly as per advice.  Keep the affected area clean and dry.  Use gloves while cooking or gardening.  Protect your skin from cuts.  Use electric razor to shave the affected area.  Keep affected limb elevated.  Do not wear tight clothes, shoes, or jewelry as it may cause the tissue to be strangled.  Do not use heat pads over the affected area.  Do not sit with cross legs.  Do not walk barefoot.  Do not carry weight on the affected arm.  Avoid having blood pressure checked on the affected limb. SEEK MEDICAL CARE IF:  You continue to have swelling in your limb. SEEK IMMEDIATE MEDICAL CARE IF:   You have high fever.  You have skin rash.  You have chills or sweats.  You have pain or redness.  You have a cut that does not heal. MAKE SURE YOU:   Understand these instructions.  Will watch your condition.  Will get help right away if you are not doing well or get worse. Document Released: 07/08/2007 Document Revised: 08/27/2012 Document Reviewed: 06/13/2009 [facility] Patient Information 2015 [place], [place]. This information is not intended to replace advice given to you by your health care provider. Make sure you discuss any questions you have with your health care provider.

## 2015-06-17 NOTE — Progress Notes (Signed)
     Subjective: Chief Complaint  Patient presents with  . Diabetes     HPI: [name] is a 59 y.o. presenting to clinic today to discuss the following:  #Diabetes:  Sugars - Morning usually around 110, after eat 195 Taking medications: Lantus 15-20U and Novolog 5-10U TID Side effects: None; denies hypoglycemia On statin: Yes Last eye exam: Dec last year  Last foot exam: Unknown. Perform today Patient is inactive. She does have a gym membership but states she does not use it.  Fair diet ROS: denies fever, chills, dizziness, LOC, polyuria, polydipsia, chest pain,  Does have numbness in extremities and vision loss due to diabetes(neuropathy and retinopathy)  #Hypertension: -inconsistent monitoring at home -taking meds as prescribed -states that cardiologist has made some adjustments ROS: Denies headache, dizziness, visual changes, nausea, vomiting, chest pain, abdominal pain or shortness of breath.  #Patient also just wants refill on medications and to meet new provider at this encounter.   ROS in HPI.  Past Medical, Surgical, Social, and Family History Reviewed & Updated per EMR.   Objective: BP 148/70 mmHg  Pulse 60  Temp(Src) 97.8 F (36.6 C) (Oral)  Ht 5\' 7"  (1.702 m)  Wt 227 lb (102.967 kg)  BMI 35.55 kg/m2  Physical Exam  Constitutional: She is oriented to person, place, and time and well-developed, well-nourished, and in no distress.  HENT:  Head: Normocephalic and atraumatic.  Eyes: Conjunctivae are normal.  Cardiovascular: Normal rate.   Pulmonary/Chest: Effort normal.  Musculoskeletal: Normal range of motion.  R leg with increased swelling compared to left (lymphedema). Right leg also with scars from surgery.  Neurological: She is alert and oriented to person, place, and time. She has normal strength. A sensory deficit is present. Gait abnormal.  Absence of sensation in bilateral legs up to mid-calf  Skin: Skin is warm and dry.   Diabetic  Foot Exam - Simple   Simple Foot Form  Diabetic Foot exam was performed with the following findings:  Yes 06/17/2015  3:07 PM  Visual Inspection  Sensation Testing  Pulse Check  Comments  No sensation up to mid-calf (no discrimination to light touch or firm pressure)      Assessment/Plan: Please see problem based Assessment and Plan   Orders Placed This Encounter  Procedures  . Flu Vaccine QUAD 36+ mos IM  . POCT A1C     Luiz Blare, DO PGY-2, [place]

## 2015-06-20 ENCOUNTER — Ambulatory Visit (INDEPENDENT_AMBULATORY_CARE_PROVIDER_SITE_OTHER): Payer: Commercial Managed Care - HMO | Admitting: Ophthalmology

## 2015-06-20 DIAGNOSIS — I890 Lymphedema, not elsewhere classified: Secondary | ICD-10-CM | POA: Insufficient documentation

## 2015-06-22 NOTE — Assessment & Plan Note (Signed)
Foot exam performed and flu shot given today.

## 2015-06-22 NOTE — Assessment & Plan Note (Signed)
Diabetes with complications including nephropathy, neuropathy, and retinopathy. A1c stable at 6.9. No reported episodes of hypoglycemia. Foot exam performed today. Will maintain current regimen. Patient to follow-up in 6 months for recheck.

## 2015-06-22 NOTE — Assessment & Plan Note (Signed)
BP mildly elevated today. Patient with recent medication adjustment by cardiologist. Patient to monitor pressures at home. May need to add additional agent is still elevated at next clinic visit.

## 2015-06-22 NOTE — Assessment & Plan Note (Signed)
Stable. Foot exam performed today showing lack of sensation up to mid-calf. No ulcers seen. Foot care discussed.

## 2015-06-30 ENCOUNTER — Ambulatory Visit (INDEPENDENT_AMBULATORY_CARE_PROVIDER_SITE_OTHER): Payer: Commercial Managed Care - HMO | Admitting: Ophthalmology

## 2015-08-03 ENCOUNTER — Other Ambulatory Visit: Payer: Self-pay | Admitting: Obstetrics and Gynecology

## 2015-08-05 ENCOUNTER — Telehealth: Payer: Self-pay | Admitting: Obstetrics and Gynecology

## 2015-08-05 DIAGNOSIS — N184 Chronic kidney disease, stage 4 (severe): Secondary | ICD-10-CM

## 2015-08-05 NOTE — Telephone Encounter (Signed)
Sandy from [facility] is calling to ask for a Silverback referral, for pt's appt on 08/11/2015. She is seeing Dr. Mercy Moore and  Dx: N18.4   Sandy's extension is [phone]. Thank you, Fonda Kinder, ASA

## 2015-08-05 NOTE — Telephone Encounter (Signed)
Will forward to MD and referral coordinator. [name],CMA

## 2015-08-07 NOTE — Telephone Encounter (Signed)
Referral placed.

## 2015-08-08 ENCOUNTER — Other Ambulatory Visit: Payer: Self-pay | Admitting: Family Medicine

## 2015-08-11 DIAGNOSIS — D631 Anemia in chronic kidney disease: Secondary | ICD-10-CM | POA: Diagnosis not present

## 2015-08-11 DIAGNOSIS — I129 Hypertensive chronic kidney disease with stage 1 through stage 4 chronic kidney disease, or unspecified chronic kidney disease: Secondary | ICD-10-CM | POA: Diagnosis not present

## 2015-08-11 DIAGNOSIS — N184 Chronic kidney disease, stage 4 (severe): Secondary | ICD-10-CM | POA: Diagnosis not present

## 2015-08-11 DIAGNOSIS — N2581 Secondary hyperparathyroidism of renal origin: Secondary | ICD-10-CM | POA: Diagnosis not present

## 2015-09-29 ENCOUNTER — Other Ambulatory Visit: Payer: Self-pay | Admitting: Family Medicine

## 2015-10-05 NOTE — Telephone Encounter (Signed)
Needs this med asap. Almost out.

## 2015-10-13 ENCOUNTER — Other Ambulatory Visit: Payer: Self-pay | Admitting: Obstetrics and Gynecology

## 2015-10-13 NOTE — Telephone Encounter (Signed)
Called [employer] they received the refill for isosorbide on 10/05/15 and shipped to patient on 10/06/15.  Derl Barrow, RN

## 2015-10-13 NOTE — Telephone Encounter (Signed)
Pt calling to report that her pharmacy has not received the refill for isosorbide mononitrate. She states that it needs to be resent and she also needs a refill on  valsartan (DIOVAN) 80 MG tablet and atorvastatin (LIPITOR) 80 MG tablet .

## 2015-10-14 MED ORDER — ATORVASTATIN CALCIUM 80 MG PO TABS
80.0000 mg | ORAL_TABLET | Freq: Every day | ORAL | Status: DC
Start: 2015-10-14 — End: 2015-11-04

## 2015-10-14 MED ORDER — VALSARTAN 80 MG PO TABS: 80.0000 mg | ORAL_TABLET | Freq: Every day | ORAL | Status: DC

## 2015-11-04 ENCOUNTER — Other Ambulatory Visit: Payer: Self-pay | Admitting: Obstetrics and Gynecology

## 2015-11-04 NOTE — Telephone Encounter (Signed)
Needs refills on : lipitor, syringes---wants 30 units and 31 gauge needle [place]

## 2015-11-05 MED ORDER — "NEEDLE (DISP) 30G X 1/2"" MISC": Status: DC

## 2015-11-05 MED ORDER — INSULIN SYRINGES (DISPOSABLE) U-100 0.5 ML MISC: Status: DC

## 2015-11-05 MED ORDER — ATORVASTATIN CALCIUM 80 MG PO TABS
80.0000 mg | ORAL_TABLET | Freq: Every day | ORAL | Status: DC
Start: 2015-11-05 — End: 2015-11-19

## 2015-11-19 ENCOUNTER — Other Ambulatory Visit: Payer: Self-pay | Admitting: Family Medicine

## 2015-11-19 ENCOUNTER — Other Ambulatory Visit: Payer: Self-pay | Admitting: Obstetrics and Gynecology

## 2015-12-08 ENCOUNTER — Other Ambulatory Visit: Payer: Self-pay | Admitting: *Deleted

## 2015-12-08 DIAGNOSIS — N2581 Secondary hyperparathyroidism of renal origin: Secondary | ICD-10-CM | POA: Diagnosis not present

## 2015-12-08 DIAGNOSIS — D631 Anemia in chronic kidney disease: Secondary | ICD-10-CM | POA: Diagnosis not present

## 2015-12-08 DIAGNOSIS — E1129 Type 2 diabetes mellitus with other diabetic kidney complication: Secondary | ICD-10-CM | POA: Diagnosis not present

## 2015-12-08 DIAGNOSIS — N184 Chronic kidney disease, stage 4 (severe): Secondary | ICD-10-CM | POA: Diagnosis not present

## 2015-12-08 DIAGNOSIS — I129 Hypertensive chronic kidney disease with stage 1 through stage 4 chronic kidney disease, or unspecified chronic kidney disease: Secondary | ICD-10-CM | POA: Diagnosis not present

## 2015-12-08 MED ORDER — INSULIN REGULAR HUMAN 100 UNIT/ML IJ SOLN: INTRAMUSCULAR | Status: DC

## 2015-12-08 MED ORDER — HYDRALAZINE HCL 100 MG PO TABS: 100.0000 mg | ORAL_TABLET | Freq: Two times a day (BID) | ORAL | Status: DC

## 2015-12-08 MED ORDER — INSULIN SYRINGES (DISPOSABLE) U-100 0.5 ML MISC: Status: DC

## 2015-12-08 MED ORDER — INSULIN GLARGINE 100 UNIT/ML ~~LOC~~ SOLN: 17.0000 [IU] | Freq: Every day | SUBCUTANEOUS | Status: DC

## 2015-12-08 NOTE — Telephone Encounter (Signed)
Needs an appointment to follow-up on chronic medical conditions.

## 2015-12-09 ENCOUNTER — Telehealth: Payer: Self-pay | Admitting: Obstetrics and Gynecology

## 2015-12-09 DIAGNOSIS — E11319 Type 2 diabetes mellitus with unspecified diabetic retinopathy without macular edema: Secondary | ICD-10-CM

## 2015-12-09 NOTE — Telephone Encounter (Signed)
Will forward to MD to place a referral.  Patient has an appt on 12/28/15. Shivangi Lutz,CMA

## 2015-12-09 NOTE — Telephone Encounter (Signed)
Pt called and needs a referral to the [facility] on 12/28/15. jw

## 2015-12-09 NOTE — Telephone Encounter (Signed)
Patient scheduled with Dr. Ernestina Patches at 1:30pm on 12-26-15.  Pcp not available. Jaime Dome,CMA

## 2015-12-11 NOTE — Telephone Encounter (Signed)
Referral placed.

## 2015-12-20 ENCOUNTER — Other Ambulatory Visit: Payer: Self-pay | Admitting: Obstetrics and Gynecology

## 2015-12-26 ENCOUNTER — Telehealth: Payer: Self-pay | Admitting: *Deleted

## 2015-12-26 ENCOUNTER — Ambulatory Visit (INDEPENDENT_AMBULATORY_CARE_PROVIDER_SITE_OTHER): Payer: Commercial Managed Care - HMO | Admitting: Family Medicine

## 2015-12-26 ENCOUNTER — Encounter: Payer: Self-pay | Admitting: Family Medicine

## 2015-12-26 VITALS — BP 124/39 | HR 52 | Temp 98.0°F | Ht 67.0 in | Wt 228.6 lb

## 2015-12-26 DIAGNOSIS — I482 Chronic atrial fibrillation, unspecified: Secondary | ICD-10-CM

## 2015-12-26 DIAGNOSIS — Z1231 Encounter for screening mammogram for malignant neoplasm of breast: Secondary | ICD-10-CM

## 2015-12-26 DIAGNOSIS — Z1211 Encounter for screening for malignant neoplasm of colon: Secondary | ICD-10-CM

## 2015-12-26 DIAGNOSIS — Z1239 Encounter for other screening for malignant neoplasm of breast: Secondary | ICD-10-CM

## 2015-12-26 DIAGNOSIS — E1159 Type 2 diabetes mellitus with other circulatory complications: Secondary | ICD-10-CM

## 2015-12-26 DIAGNOSIS — I10 Essential (primary) hypertension: Secondary | ICD-10-CM

## 2015-12-26 LAB — CBC WITH DIFFERENTIAL/PLATELET
BASOS ABS: 62 {cells}/uL (ref 0–200)
Basophils Relative: 1 %
EOS ABS: 186 {cells}/uL (ref 15–500)
Eosinophils Relative: 3 %
HEMATOCRIT: 27.7 % — AB (ref 35.0–45.0)
Hemoglobin: 8.8 g/dL — ABNORMAL LOW (ref 11.7–15.5)
LYMPHS PCT: 24 %
Lymphs Abs: 1488 cells/uL (ref 850–3900)
MCH: 25.9 pg — AB (ref 27.0–33.0)
MCHC: 31.8 g/dL — AB (ref 32.0–36.0)
MCV: 81.5 fL (ref 80.0–100.0)
MONO ABS: 558 {cells}/uL (ref 200–950)
MONOS PCT: 9 %
MPV: 10.9 fL (ref 7.5–12.5)
NEUTROS PCT: 63 %
Neutro Abs: 3906 cells/uL (ref 1500–7800)
PLATELETS: 299 10*3/uL (ref 140–400)
RBC: 3.4 MIL/uL — ABNORMAL LOW (ref 3.80–5.10)
RDW: 14.6 % (ref 11.0–15.0)
WBC: 6.2 10*3/uL (ref 3.8–10.8)

## 2015-12-26 LAB — POCT GLYCOSYLATED HEMOGLOBIN (HGB A1C): Hemoglobin A1C: 8.5

## 2015-12-26 MED ORDER — DILTIAZEM HCL ER 240 MG PO CP24: 240.0000 mg | ORAL_CAPSULE | Freq: Every day | ORAL | Status: DC

## 2015-12-26 MED ORDER — HYDRALAZINE HCL 50 MG PO TABS: 50.0000 mg | ORAL_TABLET | Freq: Every day | ORAL | Status: DC

## 2015-12-26 NOTE — Telephone Encounter (Signed)
-----   Message from Caren Macadam, MD sent at 12/26/2015  2:40 PM EDT ----- Please call patient and inform that her HA1C showed poor control of her diabetes.  She needs to increase her insulin -Lantus 22 units -Novolin 7 units  She needs to check her sugars 4 times per day for 2 weeks on this regimen and bring the book into the clinic for an appointment in 1 month. Please help schedule appt with PCP if possible.   Thanks, Dr. Ernestina Patches

## 2015-12-26 NOTE — Telephone Encounter (Signed)
Spoke with patient and she is aware of results.  She will plan to check her sugar as instructed and appt made for may to follow up. Jazmin Hartsell,CMA

## 2015-12-26 NOTE — Progress Notes (Signed)
Patient ID: [name], female   DOB: 10-22-1955, 60 y.o.   MRN: [id number]   Subjective:   [name] is a 60 y.o. female with a history of T2Dm with retinopathy/neuropathy, HTN, CAD, Afib (on anticoagulation), CKD Stage 4  Here for  Chief Complaint  Patient presents with  . Follow-up    #T2DM- did not bring a log or meter today Fasting reports < 120 PP < 180 Taking meds: Novolin and Lantus ( 20u) Statin- Yes Last eye exam- summer 2016 Last Foot exam- 05/2015, denies cuts/wounds  #CKD Stage 4: sees nephrology  #HTN: Reports BP at home is typically. 120-140/63-97. denies CP, SOB, edema. Adherent to medications currently on carvedilol, lasix, hydralazine, valsartan  #Afib: on eliquis. Taking diltiazem.   No breast sx Health Maintenance Due  Topic Date Due  . Hepatitis C Screening  1955-12-25  . HIV Screening  10/17/1970  . COLONOSCOPY  10/17/2005  . OPHTHALMOLOGY EXAM  08/25/2015  . MAMMOGRAM  11/10/2015  . HEMOGLOBIN A1C  12/15/2015    Review of Systems:   Per HPI. All other systems reviewed and are negative expect as in HPI   PMH, PSH, Medications, Allergies, SocialHx and FHx reviewed and updated in EMR- marked as reviewed 12/26/2015   Objective:  BP 124/39 mmHg  Pulse 52  Temp(Src) 98 F (36.7 C) (Oral)  Ht 5\' 7"  (1.702 m)  Wt 228 lb 9.6 oz (103.692 kg)  BMI 35.80 kg/m2  Gen:  60 y.o. female in NAD. Speaking in full sentences. Good eye contact HEENT: NCAT, MMM, EOMI, PERRL, anicteric sclerae.  CV: RRR, no MRG, [name] Resp: Normal WOB. Non-labored, CTAB, no wheezes noted GI: Soft, NTND, BS present, no guarding or organomegaly Ext: WWP, no edema MSK: Intact gait. Full ROM Neuro: Alert and oriented, speech normal Pysch: Normal mood and affect.       Chemistry      Component Value Date/Time   NA 137 01/10/2015 1428   K 5.0 01/10/2015 1428   CL 104 01/10/2015 1428   CO2 21 01/10/2015 1428   BUN 62* 01/10/2015 1428   CREATININE 3.53*  01/10/2015 1428   CREATININE 2.56* 02/13/2013 1050      Component Value Date/Time   CALCIUM 8.6 01/10/2015 1428   ALKPHOS 92 07/13/2014 1520   AST 21 07/13/2014 1520   ALT 25 07/13/2014 1520   BILITOT 0.3 07/13/2014 1520      Lab Results  Component Value Date   HGBA1C 8.5 12/26/2015   Assessment:     [name] is a 60 y.o. female here for chronic disease managment    Plan:   #T2DM with retinopathy/neuropathy/nephropathy - HA1C here today was 8.5% - Patient left before results- sent result to pool and will have MA call patient to increase Lantus to 22 units, novolin to 7 units with all meals - She needs to check BS 4 times per day on this regimen - RTC in 1 month.   #HTN- well controlled today  #Afib: rate controlled on anticoagulation  #CKD - await BMP results  #HCM - ordered Colonscopy - ordered mamogram  Caren Macadam, MD, AB[facility] OB Fellow, [facility] 12/26/2015  2:36 PM

## 2015-12-27 LAB — BASIC METABOLIC PANEL
BUN: 59 mg/dL — AB (ref 7–25)
CALCIUM: 8.7 mg/dL (ref 8.6–10.4)
CHLORIDE: 102 mmol/L (ref 98–110)
CO2: 25 mmol/L (ref 20–31)
CREATININE: 3.38 mg/dL — AB (ref 0.50–0.99)
Glucose, Bld: 187 mg/dL — ABNORMAL HIGH (ref 65–99)
Potassium: 4.6 mmol/L (ref 3.5–5.3)
Sodium: 137 mmol/L (ref 135–146)

## 2015-12-28 ENCOUNTER — Encounter (INDEPENDENT_AMBULATORY_CARE_PROVIDER_SITE_OTHER): Payer: Commercial Managed Care - HMO | Admitting: Ophthalmology

## 2015-12-28 ENCOUNTER — Encounter: Payer: Self-pay | Admitting: Gastroenterology

## 2016-01-06 ENCOUNTER — Other Ambulatory Visit (HOSPITAL_COMMUNITY): Payer: Self-pay | Admitting: Family Medicine

## 2016-01-06 DIAGNOSIS — Z1231 Encounter for screening mammogram for malignant neoplasm of breast: Secondary | ICD-10-CM

## 2016-01-25 ENCOUNTER — Ambulatory Visit: Payer: Commercial Managed Care - HMO | Admitting: Internal Medicine

## 2016-01-26 ENCOUNTER — Ambulatory Visit: Payer: Commercial Managed Care - HMO

## 2016-01-27 ENCOUNTER — Other Ambulatory Visit: Payer: Self-pay | Admitting: Family Medicine

## 2016-02-02 ENCOUNTER — Other Ambulatory Visit: Payer: Self-pay | Admitting: Obstetrics and Gynecology

## 2016-02-22 ENCOUNTER — Other Ambulatory Visit: Payer: Self-pay | Admitting: Obstetrics and Gynecology

## 2016-02-27 ENCOUNTER — Ambulatory Visit: Payer: Commercial Managed Care - HMO | Admitting: Gastroenterology

## 2016-02-28 ENCOUNTER — Ambulatory Visit
Admission: RE | Admit: 2016-02-28 | Discharge: 2016-02-28 | Disposition: A | Discharge status: Home / Self Care | Payer: Commercial Managed Care - HMO | Source: Ambulatory Visit | Attending: Family Medicine | Admitting: Family Medicine

## 2016-02-28 DIAGNOSIS — Z1231 Encounter for screening mammogram for malignant neoplasm of breast: Secondary | ICD-10-CM | POA: Diagnosis not present

## 2016-03-01 ENCOUNTER — Other Ambulatory Visit: Payer: Self-pay | Admitting: Obstetrics and Gynecology

## 2016-03-05 DIAGNOSIS — N189 Chronic kidney disease, unspecified: Secondary | ICD-10-CM | POA: Diagnosis not present

## 2016-03-05 DIAGNOSIS — D631 Anemia in chronic kidney disease: Secondary | ICD-10-CM | POA: Diagnosis not present

## 2016-03-05 DIAGNOSIS — I129 Hypertensive chronic kidney disease with stage 1 through stage 4 chronic kidney disease, or unspecified chronic kidney disease: Secondary | ICD-10-CM | POA: Diagnosis not present

## 2016-03-05 DIAGNOSIS — E1129 Type 2 diabetes mellitus with other diabetic kidney complication: Secondary | ICD-10-CM | POA: Diagnosis not present

## 2016-03-05 DIAGNOSIS — N184 Chronic kidney disease, stage 4 (severe): Secondary | ICD-10-CM | POA: Diagnosis not present

## 2016-03-05 DIAGNOSIS — N2581 Secondary hyperparathyroidism of renal origin: Secondary | ICD-10-CM | POA: Diagnosis not present

## 2016-03-08 ENCOUNTER — Other Ambulatory Visit: Payer: Self-pay | Admitting: *Deleted

## 2016-03-08 DIAGNOSIS — Z01810 Encounter for preprocedural cardiovascular examination: Secondary | ICD-10-CM

## 2016-03-08 DIAGNOSIS — N184 Chronic kidney disease, stage 4 (severe): Secondary | ICD-10-CM

## 2016-03-19 DIAGNOSIS — E875 Hyperkalemia: Secondary | ICD-10-CM | POA: Diagnosis not present

## 2016-03-30 ENCOUNTER — Encounter: Payer: Self-pay | Admitting: Vascular Surgery

## 2016-03-30 ENCOUNTER — Ambulatory Visit (INDEPENDENT_AMBULATORY_CARE_PROVIDER_SITE_OTHER): Payer: Commercial Managed Care - HMO | Admitting: Obstetrics and Gynecology

## 2016-03-30 ENCOUNTER — Encounter: Payer: Self-pay | Admitting: Obstetrics and Gynecology

## 2016-03-30 VITALS — BP 138/61 | HR 56 | Ht 67.0 in | Wt 237.0 lb

## 2016-03-30 DIAGNOSIS — I10 Essential (primary) hypertension: Secondary | ICD-10-CM | POA: Diagnosis not present

## 2016-03-30 DIAGNOSIS — E119 Type 2 diabetes mellitus without complications: Secondary | ICD-10-CM

## 2016-03-30 LAB — POCT GLYCOSYLATED HEMOGLOBIN (HGB A1C): HEMOGLOBIN A1C: 6.9

## 2016-03-30 MED ORDER — GABAPENTIN 300 MG PO CAPS: 300.0000 mg | ORAL_CAPSULE | Freq: Every day | ORAL | Status: DC

## 2016-03-30 MED ORDER — INSULIN REGULAR HUMAN 100 UNIT/ML IJ SOLN: 5.0000 [IU] | Freq: Three times a day (TID) | INTRAMUSCULAR | Status: DC

## 2016-03-30 MED ORDER — INSULIN GLARGINE 100 UNIT/ML ~~LOC~~ SOLN: 20.0000 [IU] | Freq: Every day | SUBCUTANEOUS | Status: DC

## 2016-03-30 NOTE — Patient Instructions (Addendum)
Here are some of the things we discussed today: -Lantus 20U and Novolin 5U three times daily with meals -Check sugars at least twice daily. One in AM (fasting) and one 2-hr after meal - gabapentin prescribed.   New medications: Gabapentin   Please schedule a follow-up appointment for 2 weeks to recheck sugars and blood work   Thanks for allowing me to be a part of your care! Dr. Gerarda Fraction

## 2016-03-30 NOTE — Progress Notes (Signed)
     Subjective: Chief Complaint  Patient presents with  . Diabetes     HPI: [name] is a 60 y.o. presenting to clinic today to discuss the following:  #Diabetes:  Last A1c 8.5  Sugars - High at home: 240    Low at home: 56 Taking medications: lantus 20, novlog 5-10 SSI with all meals  Decides how much SSI to give based off what she eats (does not check sugar before giving) Side effects: Having some hypoglycemia On statin Has DM related neuropathy and nephropathy ROS: denies fever, chills, dizziness, LOC, polyuria, polydipsia, chest pain, visual disturbances. + neurophaty feet  #Hypertension Blood pressure at home: unsure Blood pressure today: wnl Taking Meds: yes Side effects: None ROS: Denies headache, dizziness, visual changes, nausea, vomiting, chest pain, abdominal pain or shortness of breath.   Health Maintenance: Needs lab work, colonscopy   ROS noted in HPI.  Past Medical, Surgical, Social, and Family History Reviewed & Updated per EMR. Smoking status - Former Smoker   Objective: BP 138/61 mmHg  Pulse 56  Ht 5\' 7"  (1.702 m)  Wt 237 lb (107.502 kg)  BMI 37.11 kg/m2 Vitals and nursing notes reviewed  Physical Exam Gen: NAD. Cooperative.  CV: RRR, no MRG, [name] Resp: Normal WOB. Non-labored, CTAB, no wheezes. Ext: WWP, no edema Neuro: Alert and oriented, speech normal. No gross CN deficits. Gait abnormal walks with walker.  Pysch: Normal mood and affect.   Results for orders placed or performed in visit on 03/30/16 (from the past 72 hour(s))  HgB A1c     Status: None   Collection Time: 03/30/16 11:29 AM  Result Value Ref Range   Hemoglobin A1C 6.9     Assessment/Plan: Please see problem based Assessment and Plan   Orders Placed This Encounter  Procedures  . HgB A1c    Meds ordered this encounter  Medications  . gabapentin (NEURONTIN) 300 MG capsule    Sig: Take 1 capsule (300 mg total) by mouth at bedtime.    Dispense:  90  capsule    Refill:  0  . insulin regular (NOVOLIN R) 100 units/mL injection    Sig: Inject 0.05 mLs (5 Units total) into the skin 3 (three) times daily before meals.    Dispense:  60 mL    Refill:  5  . insulin glargine (LANTUS) 100 UNIT/ML injection    Sig: Inject 0.2 mLs (20 Units total) into the skin daily.    Dispense:  10 mL    Refill:  [place], DO 03/30/2016, 11:58 AM PGY-3, [place]

## 2016-04-02 ENCOUNTER — Ambulatory Visit (INDEPENDENT_AMBULATORY_CARE_PROVIDER_SITE_OTHER)
Admission: RE | Admit: 2016-04-02 | Discharge: 2016-04-02 | Disposition: A | Discharge status: Home / Self Care | Payer: Commercial Managed Care - HMO | Source: Ambulatory Visit | Attending: Family | Admitting: Family

## 2016-04-02 ENCOUNTER — Ambulatory Visit (HOSPITAL_COMMUNITY)
Admission: RE | Admit: 2016-04-02 | Discharge: 2016-04-02 | Disposition: A | Discharge status: Home / Self Care | Payer: Commercial Managed Care - HMO | Source: Ambulatory Visit | Attending: Family | Admitting: Family

## 2016-04-02 ENCOUNTER — Ambulatory Visit: Payer: Commercial Managed Care - HMO | Admitting: Surgery

## 2016-04-02 DIAGNOSIS — N184 Chronic kidney disease, stage 4 (severe): Secondary | ICD-10-CM

## 2016-04-02 DIAGNOSIS — Z01810 Encounter for preprocedural cardiovascular examination: Secondary | ICD-10-CM

## 2016-04-02 DIAGNOSIS — I129 Hypertensive chronic kidney disease with stage 1 through stage 4 chronic kidney disease, or unspecified chronic kidney disease: Secondary | ICD-10-CM | POA: Insufficient documentation

## 2016-04-02 DIAGNOSIS — I2510 Atherosclerotic heart disease of native coronary artery without angina pectoris: Secondary | ICD-10-CM | POA: Insufficient documentation

## 2016-04-02 DIAGNOSIS — E1122 Type 2 diabetes mellitus with diabetic chronic kidney disease: Secondary | ICD-10-CM | POA: Insufficient documentation

## 2016-04-04 ENCOUNTER — Ambulatory Visit: Payer: Commercial Managed Care - HMO | Admitting: Vascular Surgery

## 2016-04-04 NOTE — Assessment & Plan Note (Signed)
BP at goal. Continue current medication regimen.

## 2016-04-04 NOTE — Assessment & Plan Note (Signed)
A1c today 6.9. Stable from prior. With hypoglycemia stopped patient from [place]. Lantus decreased to 10U and novolin 5U TID with meals. Patient encouraged to check sugars twice daily to make sure sugars are controlled. Gabapentin refilled for her neuropathy. Needs to follow-up in clinic 2 weeks.

## 2016-04-20 ENCOUNTER — Encounter: Payer: Self-pay | Admitting: Vascular Surgery

## 2016-04-23 ENCOUNTER — Ambulatory Visit: Payer: Commercial Managed Care - HMO | Admitting: Obstetrics and Gynecology

## 2016-04-23 ENCOUNTER — Telehealth: Payer: Self-pay | Admitting: Obstetrics and Gynecology

## 2016-04-23 NOTE — Telephone Encounter (Signed)
Normal mammogram

## 2016-04-23 NOTE — Telephone Encounter (Signed)
Patient is aware of this. Valree Feild,CMA

## 2016-04-23 NOTE — Telephone Encounter (Signed)
Will forward to MD to advise. Jazmin Hartsell,CMA  

## 2016-04-23 NOTE — Telephone Encounter (Signed)
Pt would like the results of her mammogram. Please call her. Thank you. ep

## 2016-04-26 ENCOUNTER — Ambulatory Visit (INDEPENDENT_AMBULATORY_CARE_PROVIDER_SITE_OTHER): Payer: Commercial Managed Care - HMO | Admitting: Vascular Surgery

## 2016-04-26 ENCOUNTER — Encounter: Payer: Self-pay | Admitting: Vascular Surgery

## 2016-04-26 VITALS — BP 184/75 | HR 72 | Temp 98.1°F | Resp 16 | Ht 67.0 in | Wt 244.0 lb

## 2016-04-26 DIAGNOSIS — N184 Chronic kidney disease, stage 4 (severe): Secondary | ICD-10-CM

## 2016-04-26 NOTE — Progress Notes (Signed)
Referred by:  Katheren Shams, DO [place], [place] [address]  Reason for referral: New access  History of Present Illness  [name] is a 60 y.o. (16-Mar-1956) female who presents for evaluation for permanent access.  The patient is left hand dominant.  The patient has not had previous access procedures.  Previous central venous cannulation procedures include: none.  The patient has never had a PPM placed.   Pt has not made a decision in regards to hemodialysis.  Past Medical History:  Diagnosis Date  . Anemia   . Atrial fibrillation ([place])   . CAD (coronary artery disease)   . Chronic kidney disease, stage III (moderate)   . Constipation   . Diabetes mellitus ([place])   . History of blood transfusion   . Hypertension   . Myocardial infarction [facility])    in April 2014    Past Surgical History:  Procedure Laterality Date  . ABDOMINAL HYSTERECTOMY    . EYE SURGERY    . HARDWARE REMOVAL Right 02/13/2013   Procedure: HARDWARE REMOVAL;  Surgeon: Newt Minion, MD;  Location: [facility];  Service: Orthopedics;  Laterality: Right;  Right Ankle Removal Hardware, Debridement, Place Wound VAC  . laser surgery for diabetic retinopathy    . LEFT HEART CATHETERIZATION WITH CORONARY ANGIOGRAM N/A 01/09/2013   Procedure: LEFT HEART CATHETERIZATION WITH CORONARY ANGIOGRAM;  Surgeon: Jolaine Artist, MD;  Location: [facility] CATH LAB;  Service: Cardiovascular;  Laterality: N/A;  . ORIF ANKLE FRACTURE Right 01/12/2013   Procedure: OPEN REDUCTION INTERNAL FIXATION (ORIF) ANKLE FRACTURE;  Surgeon: Newt Minion, MD;  Location: [facility];  Service: Orthopedics;  Laterality: Right;    Social History   Social History  . Marital status: Legally Separated    Spouse name: N/A  . Number of children: N/A  . Years of education: N/A   Occupational History  . Disabled    Social History Main Topics  . Smoking status: Never Smoker  . Smokeless tobacco: Never Used  . Alcohol use No  . Drug  use: No  . Sexual activity: Not on file   Other Topics Concern  . Not on file   Social History Narrative   Patient lives with her son..    Family History  Problem Relation Age of Onset  . Hypertension Father     Also had CAD  . Diabetes Mother     No history CAD  . Cancer Mother   . Colon cancer Mother 28    died at 101 from [facility]    Current Outpatient Prescriptions  Medication Sig Dispense Refill  . ACCU-CHEK AVIVA PLUS test strip TEST  FASTING  BLOOD  SUGAR EVERY DAY 60 each 3  . atorvastatin (LIPITOR) 80 MG tablet TAKE 1 TABLET AT BEDTIME 90 tablet 1  . Blood Glucose Monitoring Suppl (ACCU-CHEK AVIVA PLUS) W/DEVICE KIT Please provide meter and test strp for 1 month 1 kit 0  . calcitRIOL (ROCALTROL) 0.25 MCG capsule     . carvedilol (COREG) 12.5 MG tablet TAKE 1 TABLET TWICE DAILY WITH FOOD 180 tablet 1  . cetirizine (ZYRTEC) 10 MG tablet Take 1 tablet (10 mg total) by mouth daily. 30 tablet 11  . diltiazem (DILT-XR) 240 MG 24 hr capsule Take 1 capsule (240 mg total) by mouth daily. 90 capsule 3  . ELIQUIS 2.5 MG TABS tablet TAKE 1 TABLET TWICE DAILY 180 tablet 3  . ferrous sulfate 325 (65 FE) MG tablet  Take 1 tablet (325 mg total) by mouth daily with breakfast. 30 tablet 3  . fluticasone (FLONASE) 50 MCG/ACT nasal spray Place 2 sprays into both nostrils daily. 16 g 6  . furosemide (LASIX) 40 MG tablet Take 1 tablet (40 mg total) by mouth daily. Needs PCP appointment. 30 tablet 5  . gabapentin (NEURONTIN) 300 MG capsule Take 1 capsule (300 mg total) by mouth at bedtime. 90 capsule 0  . hydrALAZINE (APRESOLINE) 100 MG tablet TAKE 1 TABLET TWICE DAILY 180 tablet 2  . hydrALAZINE (APRESOLINE) 50 MG tablet Take 1 tablet (50 mg total) by mouth daily. Midday dose 90 tablet 5  . insulin glargine (LANTUS) 100 UNIT/ML injection Inject 0.2 mLs (20 Units total) into the skin daily. 10 mL 5  . insulin regular (NOVOLIN R) 100 units/mL injection Inject 0.05 mLs (5 Units total) into the skin 3  (three) times daily before meals. 60 mL 5  . Insulin Syringes, Disposable, U-100 0.5 ML MISC 4x daily injections 100 each 8  . isosorbide mononitrate (IMDUR) 30 MG 24 hr tablet TAKE 1 TABLET EVERY DAY 90 tablet 2  . Lancet Devices (ACCU-CHEK SOFTCLIX) lancets Fill for 1 month for TID testing. Use as instructed 90 each 0  . Multiple Vitamin (MULTIVITAMIN WITH MINERALS) TABS Take 1 tablet by mouth daily.    Marland Kitchen NEEDLE, DISP, 30 G (BD DISP NEEDLES) 30G X 1/2" MISC For 4x daily injections 100 each 13  . nitroGLYCERIN (NITROSTAT) 0.4 MG SL tablet Place 1 tablet (0.4 mg total) under the tongue every 5 (five) minutes as needed for chest pain. 30 tablet 0  . pantoprazole (PROTONIX) 40 MG tablet Take 1 tablet (40 mg total) by mouth daily. Needs MD eval. 30 tablet 0  . PROCRIT 17408 UNIT/ML injection     . valsartan (DIOVAN) 80 MG tablet TAKE 1 TABLET EVERY DAY 90 tablet 0  . Vitamin D, Ergocalciferol, (DRISDOL) 50000 UNITS CAPS capsule Take 1 capsule (50,000 Units total) by mouth every 7 (seven) days. (wednesdays). Need recheck level end of May with PCP. 7 capsule 0  . zoster vaccine live, PF, (ZOSTAVAX) 14481 UNT/0.65ML injection Inject 19,400 Units into the skin once. 1 each 0  . traMADol (ULTRAM) 50 MG tablet Take 1 tablet (50 mg total) by mouth every 6 (six) hours as needed for pain. Maximum dose= 8 tablets per day (Patient not taking: Reported on 04/26/2016) 60 tablet 0   No current facility-administered medications for this visit.     Allergies  Allergen Reactions  . Lisinopril Other (See Comments)    coughing  . Peanut-Containing Drug Products Itching    REVIEW OF SYSTEMS:  (Positives checked otherwise negative)  CARDIOVASCULAR:   _0  chest pain,  _1  chest pressure,  _2  palpitations,  _3  shortness of breath when laying flat,  _4  shortness of breath with exertion,   _5  pain in feet when walking,  _6  pain in feet when laying flat, _7  history of blood clot in veins (DVT),  _8   history of phlebitis,  _9  swelling in legs,  _10  varicose veins  PULMONARY:   _11  productive cough,  _12  asthma,  _13  wheezing  NEUROLOGIC:   _14  weakness in arms or legs,  _15  numbness in arms or legs,  _16  difficulty speaking or slurred speech,  _17  temporary loss of vision in one eye,  _18  dizziness  HEMATOLOGIC:   _19   bleeding problems,  _0  problems with blood clotting too easily  MUSCULOSKEL:   _1  joint pain, _2  joint swelling  GASTROINTEST:   _3  vomiting blood,  _4  blood in stool     GENITOURINARY:   _5  burning with urination,  _6  blood in urine  PSYCHIATRIC:   _7  history of major depression  INTEGUMENTARY:   _8  rashes,  _9  ulcers  CONSTITUTIONAL:   _10  fever,  _11  chills   Physical Examination  Vitals:   04/26/16 1332 04/26/16 1334  BP: (!) 185/77 (!) 184/75  Pulse: 72 72  Resp: 16   Temp: 98.1 F (36.7 C)   SpO2: 95%   Weight: 244 lb (110.7 kg)   Height: _12  (1.702 m)    Body mass index is 38.22 kg/m.  General: A&O x 3, WD, Obese, somewhat upset  Head: [place]/AT  Ear/Nose/Throat: Hearing grossly intact, nares w/o erythema or drainage, oropharynx w/o Erythema/Exudate, Mallampati score: 3  Eyes: PERRLA, EOMI  Neck: Supple, no nuchal rigidity, no palpable LAD, stout neck  Pulmonary: Sym exp, good air movt, CTAB, no rales, rhonchi, & wheezing  Cardiac: RRR, Nl S1, S2, no Murmurs, rubs or gallops  Vascular: Vessel Right Left  Radial Palpable Palpable  Ulnar Not Palpable Not Palpable  Brachial Palpable Palpable  Carotid Palpable, without bruit Palpable, without bruit  Aorta Not palpable due to pannus N/A  Femoral Palpable Palpable  Popliteal Not palpable Not palpable  PT Palpable Palpable  DP Palpable Palpable   Gastrointestinal: soft, NTND, -G/R, - HSM, - masses, - CVAT B  Musculoskeletal: M/S 5/5 throughout , Extremities without ischemic changes   Neurologic: CN 2-12 intact , Pain and light touch intact in  extremities , Motor exam as listed above  Psychiatric: Judgment intact, Mood & affect appropriate for pt's clinical situation, somewhat upset   Dermatologic: See M/S exam for extremity exam, no rashes otherwise noted  Lymph : No Cervical, Axillary, or Inguinal lymphadenopathy    Non-Invasive Vascular Imaging  Vein Mapping  (Date: 04/26/2016):   R arm: acceptable vein conduits include marginal upper arm cephalic, acceptable upper arm basilic  L arm: acceptable vein conduits include upper arm basilic  BUE Doppler (Date: 04/26/2016):   R arm:   Brachial: tri, 5.3 mm  Radial: tri, 3.1 mm  Ulnar: tri ,2.4 mm  L arm:   Brachial: tri, 4.6 mm  Radial: tri, 3.5 mm  Ulnar: tri, 3.8 mm    Medical Decision Making  [name] is a 60 y.o. female who presents with chronic kidney disease stage 4   Based on vein mapping and examination, this patient's permanent access options include: R BC AVF vs staged BVT, L staged BVT  I would start with a R BC AVF vs R 1st BVT,  I had an extensive discussion with this patient in regards to the nature of access surgery, including risk, benefits, and alternatives.    The patient is aware that the risks of access surgery include but are not limited to: bleeding, infection, steal syndrome, nerve damage, ischemic monomelic neuropathy, failure of access to mature, and possible need for additional access procedures in the future. I discussed with the patient the nature of the staged access procedure, specifically the need for a second operation to transpose the first stage fistula if it matures adequately.    The patient has NOT agreed to proceed with the above procedure as she has NOT decided  to proceed forward with HD.   Adele Barthel, MD [facility] Office: [phone] Pager: [phone]  04/26/2016, 1:55 PM

## 2016-05-01 DIAGNOSIS — N2581 Secondary hyperparathyroidism of renal origin: Secondary | ICD-10-CM | POA: Diagnosis not present

## 2016-05-01 DIAGNOSIS — I129 Hypertensive chronic kidney disease with stage 1 through stage 4 chronic kidney disease, or unspecified chronic kidney disease: Secondary | ICD-10-CM | POA: Diagnosis not present

## 2016-05-01 DIAGNOSIS — D631 Anemia in chronic kidney disease: Secondary | ICD-10-CM | POA: Diagnosis not present

## 2016-05-01 DIAGNOSIS — N184 Chronic kidney disease, stage 4 (severe): Secondary | ICD-10-CM | POA: Diagnosis not present

## 2016-05-01 DIAGNOSIS — E1129 Type 2 diabetes mellitus with other diabetic kidney complication: Secondary | ICD-10-CM | POA: Diagnosis not present

## 2016-05-18 ENCOUNTER — Other Ambulatory Visit: Payer: Self-pay | Admitting: Obstetrics and Gynecology

## 2016-06-01 ENCOUNTER — Telehealth: Payer: Self-pay | Admitting: Cardiology

## 2016-06-01 NOTE — Telephone Encounter (Signed)
Returned call to patient-made patient aware that once paperwork is received via fax, MD Stanford Breed would review and we could fax back to number provided.  Pt verbalized understanding.

## 2016-06-01 NOTE — Telephone Encounter (Signed)
New message     FYI    Pt calling stating that she needs the doctor to fill out her [employer] bus form. He needs to have it returned by Sept 15, 2017. She will fax it to [place] tomorrow. The form has the return fax # on it.

## 2016-06-05 ENCOUNTER — Telehealth: Payer: Self-pay | Admitting: Cardiology

## 2016-06-05 NOTE — Telephone Encounter (Signed)
New message       Calling to check the status on papers from [place] .  It was faxed yesterday.  Pt need this form completed so that she can continue to use this transportation.  Please call

## 2016-06-05 NOTE — Telephone Encounter (Signed)
Returned call to patient-aware fax was received and is in MD [place] box to sign.  Pt reports she needs completed by 9/15.

## 2016-06-07 NOTE — Telephone Encounter (Signed)
[employer] paperwork faxed to [phone].

## 2016-06-15 ENCOUNTER — Telehealth: Payer: Self-pay | Admitting: *Deleted

## 2016-06-15 NOTE — Telephone Encounter (Signed)
Pt calling in needing a referral for a colonoscopy please advise.Deseree Kennon Holter, CMA

## 2016-06-18 NOTE — Telephone Encounter (Signed)
Appears patient does not have insurance therefore no referral is necessary as this will be fruitless. Please provide patient with numbers to GI to set up colonoscopy and possible payment plan for this. Thanks

## 2016-06-19 NOTE — Telephone Encounter (Signed)
Spoke with pt and gave her information on GI offices here in [place].

## 2016-06-25 ENCOUNTER — Telehealth: Payer: Self-pay | Admitting: Obstetrics and Gynecology

## 2016-06-25 NOTE — Telephone Encounter (Signed)
Pt had a [employer] form faxed over and needs it filled out. Pt says if you have any questions please give her a call. Please advise. Thanks! ep

## 2016-06-27 DIAGNOSIS — Z6837 Body mass index (BMI) 37.0-37.9, adult: Secondary | ICD-10-CM | POA: Diagnosis not present

## 2016-06-27 DIAGNOSIS — D631 Anemia in chronic kidney disease: Secondary | ICD-10-CM | POA: Diagnosis not present

## 2016-06-27 DIAGNOSIS — N2581 Secondary hyperparathyroidism of renal origin: Secondary | ICD-10-CM | POA: Diagnosis not present

## 2016-06-27 DIAGNOSIS — Z23 Encounter for immunization: Secondary | ICD-10-CM | POA: Diagnosis not present

## 2016-06-27 DIAGNOSIS — E1129 Type 2 diabetes mellitus with other diabetic kidney complication: Secondary | ICD-10-CM | POA: Diagnosis not present

## 2016-06-27 DIAGNOSIS — I129 Hypertensive chronic kidney disease with stage 1 through stage 4 chronic kidney disease, or unspecified chronic kidney disease: Secondary | ICD-10-CM | POA: Diagnosis not present

## 2016-06-27 DIAGNOSIS — N184 Chronic kidney disease, stage 4 (severe): Secondary | ICD-10-CM | POA: Diagnosis not present

## 2016-06-28 ENCOUNTER — Telehealth: Payer: Self-pay | Admitting: Cardiology

## 2016-06-28 NOTE — Telephone Encounter (Signed)
Received records from [facility] for appointment on 07/31/16 with Dr Stanford Breed.  Records given to [facility] (medical records) for Dr Jacalyn Lefevre Schedule. lp

## 2016-06-28 NOTE — Telephone Encounter (Signed)
No form was seen in my box. Will await forms and fill out when I receive them. Please inform patient.

## 2016-07-04 NOTE — Telephone Encounter (Signed)
Patient is aware of this and will try and drop off a copy. Jazmin Hartsell,CMA

## 2016-07-17 ENCOUNTER — Encounter: Payer: Self-pay | Admitting: Cardiology

## 2016-07-26 NOTE — Progress Notes (Deleted)
HPI: FU coronary artery disease and atrial fibrillation. Patient was admitted in April of 2014 following a syncopal episode. She fractured her fibula which required surgical intervention. She was noted to be in atrial fibrillation with a rapid ventricular response at the time of admission. She converted spontaneously to sinus rhythm. She also ruled in for a non-ST elevation myocardial infarction. Cardiac catheterization in April of 2014 revealed normal left main, 40% mid LAD and 30% distal. 80% proximal circumflex and 30% right coronary artery. It was felt medical therapy indicated unless she had recurrent symptoms of chest pain and PCI could be considered at that time. Echocardiogram in April of 2014 showed normal LV function, mild left ventricular hypertrophy and mild left atrial enlargement. Carotid Dopplers in April of 2014 showed no significant stenosis. Since she was last seen,   Current Outpatient Prescriptions  Medication Sig Dispense Refill  . ACCU-CHEK AVIVA PLUS test strip TEST  FASTING  BLOOD  SUGAR EVERY DAY 60 each 3  . atorvastatin (LIPITOR) 80 MG tablet TAKE 1 TABLET AT BEDTIME 90 tablet 1  . Blood Glucose Monitoring Suppl (ACCU-CHEK AVIVA PLUS) W/DEVICE KIT Please provide meter and test strp for 1 month 1 kit 0  . calcitRIOL (ROCALTROL) 0.25 MCG capsule     . carvedilol (COREG) 12.5 MG tablet TAKE 1 TABLET TWICE DAILY WITH FOOD 180 tablet 1  . cetirizine (ZYRTEC) 10 MG tablet Take 1 tablet (10 mg total) by mouth daily. 30 tablet 11  . diltiazem (DILT-XR) 240 MG 24 hr capsule Take 1 capsule (240 mg total) by mouth daily. 90 capsule 3  . ELIQUIS 2.5 MG TABS tablet TAKE 1 TABLET TWICE DAILY 180 tablet 3  . ferrous sulfate 325 (65 FE) MG tablet Take 1 tablet (325 mg total) by mouth daily with breakfast. 30 tablet 3  . fluticasone (FLONASE) 50 MCG/ACT nasal spray Place 2 sprays into both nostrils daily. 16 g 6  . furosemide (LASIX) 40 MG tablet Take 1 tablet (40 mg total) by mouth  daily. Needs PCP appointment. 30 tablet 5  . gabapentin (NEURONTIN) 300 MG capsule Take 1 capsule (300 mg total) by mouth at bedtime. 90 capsule 0  . hydrALAZINE (APRESOLINE) 100 MG tablet TAKE 1 TABLET TWICE DAILY 180 tablet 2  . hydrALAZINE (APRESOLINE) 50 MG tablet Take 1 tablet (50 mg total) by mouth daily. Midday dose 90 tablet 5  . insulin glargine (LANTUS) 100 UNIT/ML injection Inject 0.2 mLs (20 Units total) into the skin daily. 10 mL 5  . insulin regular (NOVOLIN R) 100 units/mL injection Inject 0.05 mLs (5 Units total) into the skin 3 (three) times daily before meals. 60 mL 5  . Insulin Syringes, Disposable, U-100 0.5 ML MISC 4x daily injections 100 each 8  . isosorbide mononitrate (IMDUR) 30 MG 24 hr tablet TAKE 1 TABLET EVERY DAY 90 tablet 1  . Lancet Devices (ACCU-CHEK SOFTCLIX) lancets Fill for 1 month for TID testing. Use as instructed 90 each 0  . Multiple Vitamin (MULTIVITAMIN WITH MINERALS) TABS Take 1 tablet by mouth daily.    Marland Kitchen NEEDLE, DISP, 30 G (BD DISP NEEDLES) 30G X 1/2" MISC For 4x daily injections 100 each 13  . nitroGLYCERIN (NITROSTAT) 0.4 MG SL tablet Place 1 tablet (0.4 mg total) under the tongue every 5 (five) minutes as needed for chest pain. 30 tablet 0  . pantoprazole (PROTONIX) 40 MG tablet Take 1 tablet (40 mg total) by mouth daily. Needs MD eval. 30 tablet  0  . PROCRIT 40768 UNIT/ML injection     . traMADol (ULTRAM) 50 MG tablet Take 1 tablet (50 mg total) by mouth every 6 (six) hours as needed for pain. Maximum dose= 8 tablets per day (Patient not taking: Reported on 04/26/2016) 60 tablet 0  . valsartan (DIOVAN) 80 MG tablet TAKE 1 TABLET EVERY DAY 90 tablet 0  . Vitamin D, Ergocalciferol, (DRISDOL) 50000 UNITS CAPS capsule Take 1 capsule (50,000 Units total) by mouth every 7 (seven) days. (wednesdays). Need recheck level end of May with PCP. 7 capsule 0  . zoster vaccine live, PF, (ZOSTAVAX) 08811 UNT/0.65ML injection Inject 19,400 Units into the skin once. 1  each 0   No current facility-administered medications for this visit.      Past Medical History:  Diagnosis Date  . Anemia   . Atrial fibrillation ([place])   . CAD (coronary artery disease)   . Chronic kidney disease, stage III (moderate)   . Constipation   . Diabetes mellitus ([place])   . History of blood transfusion   . Hypertension   . Myocardial infarction    in April 2014    Past Surgical History:  Procedure Laterality Date  . ABDOMINAL HYSTERECTOMY    . EYE SURGERY    . HARDWARE REMOVAL Right 02/13/2013   Procedure: HARDWARE REMOVAL;  Surgeon: Newt Minion, MD;  Location: [place];  Service: Orthopedics;  Laterality: Right;  Right Ankle Removal Hardware, Debridement, Place Wound VAC  . laser surgery for diabetic retinopathy    . LEFT HEART CATHETERIZATION WITH CORONARY ANGIOGRAM N/A 01/09/2013   Procedure: LEFT HEART CATHETERIZATION WITH CORONARY ANGIOGRAM;  Surgeon: Jolaine Artist, MD;  Location: [facility] C[facility] LAB;  Service: Cardiovascular;  Laterality: N/A;  . ORIF ANKLE FRACTURE Right 01/12/2013   Procedure: OPEN REDUCTION INTERNAL FIXATION (ORIF) ANKLE FRACTURE;  Surgeon: Newt Minion, MD;  Location: [place];  Service: Orthopedics;  Laterality: Right;    Social History   Social History  . Marital status: Legally Separated    Spouse name: N/A  . Number of children: N/A  . Years of education: N/A   Occupational History  . Disabled    Social History Main Topics  . Smoking status: Never Smoker  . Smokeless tobacco: Never Used  . Alcohol use No  . Drug use: No  . Sexual activity: Not on file   Other Topics Concern  . Not on file   Social History Narrative   Patient lives with her son..    Family History  Problem Relation Age of Onset  . Hypertension Father     Also had CAD  . Diabetes Mother     No history CAD  . Cancer Mother   . Colon cancer Mother 41    died at 47 from [place]    ROS: no fevers or chills, productive cough, hemoptysis, dysphasia, odynophagia,  melena, hematochezia, dysuria, hematuria, rash, seizure activity, orthopnea, PND, pedal edema, claudication. Remaining systems are negative.  Physical Exam: Well-developed well-nourished in no acute distress.  Skin is warm and dry.  HEENT is normal.  Neck is supple.  Chest is clear to auscultation with normal expansion.  Cardiovascular exam is regular rate and rhythm.  Abdominal exam nontender or distended. No masses palpated. Extremities show no edema. neuro grossly intact  ECG

## 2016-07-30 DIAGNOSIS — N184 Chronic kidney disease, stage 4 (severe): Secondary | ICD-10-CM | POA: Diagnosis not present

## 2016-07-31 ENCOUNTER — Ambulatory Visit: Payer: Commercial Managed Care - HMO | Admitting: Cardiology

## 2016-08-06 ENCOUNTER — Telehealth: Payer: Self-pay | Admitting: Obstetrics and Gynecology

## 2016-08-06 NOTE — Telephone Encounter (Signed)
Scheduled free diabetic eye exam from [facility] 08/31/16 at 1:30pm -Mesha Guinyard

## 2016-08-24 ENCOUNTER — Other Ambulatory Visit: Payer: Self-pay | Admitting: Obstetrics and Gynecology

## 2016-08-27 ENCOUNTER — Ambulatory Visit (INDEPENDENT_AMBULATORY_CARE_PROVIDER_SITE_OTHER): Payer: Commercial Managed Care - HMO | Admitting: Gastroenterology

## 2016-08-27 ENCOUNTER — Encounter: Payer: Self-pay | Admitting: Gastroenterology

## 2016-08-27 ENCOUNTER — Telehealth: Payer: Self-pay

## 2016-08-27 VITALS — BP 110/54 | HR 60 | Ht 66.25 in | Wt 234.0 lb

## 2016-08-27 DIAGNOSIS — Z800 Family history of malignant neoplasm of digestive organs: Secondary | ICD-10-CM | POA: Diagnosis not present

## 2016-08-27 MED ORDER — NA SULFATE-K SULFATE-MG SULF 17.5-3.13-1.6 GM/177ML PO SOLN: 1.0000 | Freq: Once | ORAL | 0 refills | Status: AC

## 2016-08-27 NOTE — Telephone Encounter (Signed)
Patient contacted and notified to hold Eliquis for 3 days.  She verbalized understanding her last dose of Eliquis will be on 10/22/16.  She is advised she will resume the day after her procedure.

## 2016-08-27 NOTE — Progress Notes (Signed)
HPI: This is a  very pleasant 60 year old woman  who was referred to me by Caren Macadam,*  to evaluate  family history colon cancer .    Chief complaint is family history of colon cancer, current blood thinner use for atrial fibrillation  Mother had colon cancer, diagnosed in early 56's; she died from colon cancer in her mid 46s  Never had colon cancer screening.  No GI issues  Overall stable weight.  She is on elquis for afib; sees Dr. Stanford Breed for this.  Has been on this for 3 years.  She has very poor vision and is legally blind   Review of systems: Pertinent positive and negative review of systems were noted in the above HPI section. Complete review of systems was performed and was otherwise normal.   Past Medical History:  Diagnosis Date  . Anemia   . Atrial fibrillation ([place])   . CAD (coronary artery disease)   . Chronic kidney disease, stage III (moderate)   . Constipation   . Diabetes mellitus ([place])   . History of blood transfusion   . Hypertension   . Myocardial infarction    in April 2014    Past Surgical History:  Procedure Laterality Date  . ANKLE SURGERY Right    x 6 total  . HARDWARE REMOVAL Right 02/13/2013   Procedure: HARDWARE REMOVAL;  Surgeon: Newt Minion, MD;  Location: [place];  Service: Orthopedics;  Laterality: Right;  Right Ankle Removal Hardware, Debridement, Place Wound VAC  . laser surgery for diabetic retinopathy Bilateral   . LEFT HEART CATHETERIZATION WITH CORONARY ANGIOGRAM N/A 01/09/2013   Procedure: LEFT HEART CATHETERIZATION WITH CORONARY ANGIOGRAM;  Surgeon: Jolaine Artist, MD;  Location: [facility] CATH LAB;  Service: Cardiovascular;  Laterality: N/A;  . ORIF ANKLE FRACTURE Right 01/12/2013   Procedure: OPEN REDUCTION INTERNAL FIXATION (ORIF) ANKLE FRACTURE;  Surgeon: Newt Minion, MD;  Location: [place];  Service: Orthopedics;  Laterality: Right;  . PARTIAL HYSTERECTOMY      Current Outpatient Prescriptions  Medication Sig  Dispense Refill  . ACCU-CHEK AVIVA PLUS test strip TEST  FASTING  BLOOD  SUGAR EVERY DAY 60 each 3  . atorvastatin (LIPITOR) 80 MG tablet TAKE 1 TABLET AT BEDTIME 90 tablet 1  . BD INSULIN SYRINGE ULTRAFINE 31G X 5/16" 0.5 ML MISC USE FOUR TIMES DAILY  FOR  INJECTIONS 360 each 8  . Blood Glucose Monitoring Suppl (ACCU-CHEK AVIVA PLUS) W/DEVICE KIT Please provide meter and test strp for 1 month 1 kit 0  . calcitRIOL (ROCALTROL) 0.25 MCG capsule     . carvedilol (COREG) 12.5 MG tablet TAKE 1 TABLET TWICE DAILY WITH FOOD 180 tablet 1  . diltiazem (DILT-XR) 240 MG 24 hr capsule Take 1 capsule (240 mg total) by mouth daily. 90 capsule 3  . ELIQUIS 2.5 MG TABS tablet TAKE 1 TABLET TWICE DAILY 180 tablet 3  . furosemide (LASIX) 40 MG tablet Take 1 tablet (40 mg total) by mouth daily. Needs PCP appointment. 30 tablet 5  . gabapentin (NEURONTIN) 300 MG capsule Take 1 capsule (300 mg total) by mouth at bedtime. 90 capsule 0  . hydrALAZINE (APRESOLINE) 100 MG tablet TAKE 1 TABLET TWICE DAILY 180 tablet 2  . insulin glargine (LANTUS) 100 UNIT/ML injection Inject 0.2 mLs (20 Units total) into the skin daily. 10 mL 5  . insulin regular (NOVOLIN R) 100 units/mL injection Inject 0.05 mLs (5 Units total) into the skin 3 (three) times daily before meals.  60 mL 5  . Insulin Syringes, Disposable, U-100 0.5 ML MISC 4x daily injections 100 each 8  . isosorbide mononitrate (IMDUR) 30 MG 24 hr tablet TAKE 1 TABLET EVERY DAY 90 tablet 1  . Lancet Devices (ACCU-CHEK SOFTCLIX) lancets Fill for 1 month for TID testing. Use as instructed 90 each 0  . Multiple Vitamin (MULTIVITAMIN WITH MINERALS) TABS Take 1 tablet by mouth daily.    Marland Kitchen NEEDLE, DISP, 30 G (BD DISP NEEDLES) 30G X 1/2" MISC For 4x daily injections 100 each 13  . nitroGLYCERIN (NITROSTAT) 0.4 MG SL tablet Place 1 tablet (0.4 mg total) under the tongue every 5 (five) minutes as needed for chest pain. 30 tablet 0  . valsartan (DIOVAN) 80 MG tablet TAKE 1 TABLET  EVERY DAY 90 tablet 1   No current facility-administered medications for this visit.     Allergies as of 08/27/2016 - Review Complete 08/27/2016  Allergen Reaction Noted  . Lisinopril Other (See Comments) 10/09/2011  . Peanut-containing drug products Itching 02/12/2013    Family History  Problem Relation Age of Onset  . Hypertension Father     Also had CAD  . Diabetes Mother     No history CAD  . Colon cancer Mother 67    died at 57 from [place]  . Cervical cancer Sister     Social History   Social History  . Marital status: Legally Separated    Spouse name: N/A  . Number of children: 4  . Years of education: N/A   Occupational History  . Disabled    Social History Main Topics  . Smoking status: Former Smoker    Quit date: 08/27/1976  . Smokeless tobacco: Never Used  . Alcohol use No  . Drug use: No  . Sexual activity: Not on file   Other Topics Concern  . Not on file   Social History Narrative   Patient lives with her son.Marland Kitchen     Physical Exam: Ht 5' 6.25" (1.683 m)   Wt 234 lb (106.1 kg)   BMI 37.48 kg/m  Constitutional: generally well-appearing Psychiatric: alert and oriented x3 Eyes: extraocular movements intact Mouth: oral pharynx moist, no lesions Neck: supple no lymphadenopathy Cardiovascular: heart regular rate and rhythm Lungs: clear to auscultation bilaterally Abdomen: soft, nontender, nondistended, no obvious ascites, no peritoneal signs, normal bowel sounds Extremities: no lower extremity edema bilaterally Skin: no lesions on visible extremities   Assessment and plan: 60 y.o. female with  Family history of colon cancer, ongoing chronic blood thinner use  Her mother was diagnosed with colon cancer in her early 60s and died from the disease in her mid 69s. She has never been screened for colon cancer that she is aware of and she is certainly never had a colonoscopy. She takes Eliquis for atrial fibrillation. I recommended we proceed with  screening examination at her soonest convenience. She understands that blood thinner puts her at increased risk for bleeding complications during colonoscopy annually generally asked that she stop Eliquis 2 days prior to the colonoscopy. We will communicate with her cardiologist to make sure he is comfortable with those recommendations. I see no reason for any further blood tests or imaging studies prior to the colonoscopy.   Owens Loffler, MD [facility] 08/27/2016, 10:25 AM  Cc: Caren Macadam,*

## 2016-08-27 NOTE — Telephone Encounter (Signed)
Hold apixaban 3 days prior to procedure and resume day after [name]

## 2016-08-27 NOTE — Patient Instructions (Signed)
You will be set up for a colonoscopy for FH of colon cancer. We will communicate with Dr. Stanford Breed about holding your eliquis for 2 days prior to the colonosocpy.

## 2016-08-27 NOTE — Telephone Encounter (Signed)
08/27/2016   RE: PENNY ARRAMBIDE DOB: 05-03-1956 MRN: [id number]   Dear Dr Stanford Breed,    We have scheduled the above patient for an endoscopic procedure. Our records show that she is on anticoagulation therapy.   Please advise as to how long the patient may come off her therapy of Eliquis prior to the procedure, which is scheduled for 10/26/16.  Please fax back/ or route to Christian Mate RN at [phone].   Sincerely,    Christian Mate RN

## 2016-08-30 ENCOUNTER — Encounter (INDEPENDENT_AMBULATORY_CARE_PROVIDER_SITE_OTHER): Payer: Commercial Managed Care - HMO | Admitting: Ophthalmology

## 2016-10-12 ENCOUNTER — Encounter: Payer: Self-pay | Admitting: Gastroenterology

## 2016-10-26 ENCOUNTER — Encounter: Payer: Commercial Managed Care - HMO | Admitting: Gastroenterology

## 2016-11-07 DIAGNOSIS — N2581 Secondary hyperparathyroidism of renal origin: Secondary | ICD-10-CM | POA: Diagnosis not present

## 2016-11-07 DIAGNOSIS — D631 Anemia in chronic kidney disease: Secondary | ICD-10-CM | POA: Diagnosis not present

## 2016-11-07 DIAGNOSIS — Z6837 Body mass index (BMI) 37.0-37.9, adult: Secondary | ICD-10-CM | POA: Diagnosis not present

## 2016-11-07 DIAGNOSIS — N184 Chronic kidney disease, stage 4 (severe): Secondary | ICD-10-CM | POA: Diagnosis not present

## 2016-11-07 DIAGNOSIS — E1129 Type 2 diabetes mellitus with other diabetic kidney complication: Secondary | ICD-10-CM | POA: Diagnosis not present

## 2016-11-07 DIAGNOSIS — I129 Hypertensive chronic kidney disease with stage 1 through stage 4 chronic kidney disease, or unspecified chronic kidney disease: Secondary | ICD-10-CM | POA: Diagnosis not present

## 2016-11-13 ENCOUNTER — Other Ambulatory Visit: Payer: Self-pay | Admitting: Family Medicine

## 2016-11-13 ENCOUNTER — Other Ambulatory Visit: Payer: Self-pay | Admitting: Obstetrics and Gynecology

## 2016-11-26 ENCOUNTER — Other Ambulatory Visit: Payer: Self-pay | Admitting: Obstetrics and Gynecology

## 2016-11-26 ENCOUNTER — Encounter: Payer: Medicare HMO | Admitting: Gastroenterology

## 2016-11-26 MED ORDER — DILTIAZEM HCL ER 240 MG PO CP24: 240.0000 mg | ORAL_CAPSULE | Freq: Every day | ORAL | 3 refills | Status: DC

## 2017-01-08 DIAGNOSIS — E1129 Type 2 diabetes mellitus with other diabetic kidney complication: Secondary | ICD-10-CM | POA: Diagnosis not present

## 2017-01-08 DIAGNOSIS — Z6831 Body mass index (BMI) 31.0-31.9, adult: Secondary | ICD-10-CM | POA: Diagnosis not present

## 2017-01-08 DIAGNOSIS — I129 Hypertensive chronic kidney disease with stage 1 through stage 4 chronic kidney disease, or unspecified chronic kidney disease: Secondary | ICD-10-CM | POA: Diagnosis not present

## 2017-01-08 DIAGNOSIS — D631 Anemia in chronic kidney disease: Secondary | ICD-10-CM | POA: Diagnosis not present

## 2017-01-08 DIAGNOSIS — N2581 Secondary hyperparathyroidism of renal origin: Secondary | ICD-10-CM | POA: Diagnosis not present

## 2017-01-08 DIAGNOSIS — N184 Chronic kidney disease, stage 4 (severe): Secondary | ICD-10-CM | POA: Diagnosis not present

## 2017-01-16 ENCOUNTER — Other Ambulatory Visit: Payer: Self-pay | Admitting: Obstetrics and Gynecology

## 2017-01-24 ENCOUNTER — Other Ambulatory Visit: Payer: Self-pay | Admitting: Obstetrics and Gynecology

## 2017-01-28 ENCOUNTER — Ambulatory Visit (INDEPENDENT_AMBULATORY_CARE_PROVIDER_SITE_OTHER): Payer: Medicare HMO | Admitting: Obstetrics and Gynecology

## 2017-01-28 ENCOUNTER — Encounter: Payer: Self-pay | Admitting: Obstetrics and Gynecology

## 2017-01-28 VITALS — BP 138/78 | HR 67 | Temp 97.8°F | Wt 225.0 lb

## 2017-01-28 DIAGNOSIS — I482 Chronic atrial fibrillation, unspecified: Secondary | ICD-10-CM

## 2017-01-28 DIAGNOSIS — E785 Hyperlipidemia, unspecified: Secondary | ICD-10-CM | POA: Diagnosis not present

## 2017-01-28 DIAGNOSIS — I10 Essential (primary) hypertension: Secondary | ICD-10-CM | POA: Diagnosis not present

## 2017-01-28 DIAGNOSIS — Z794 Long term (current) use of insulin: Secondary | ICD-10-CM

## 2017-01-28 DIAGNOSIS — Z1159 Encounter for screening for other viral diseases: Secondary | ICD-10-CM

## 2017-01-28 DIAGNOSIS — E118 Type 2 diabetes mellitus with unspecified complications: Secondary | ICD-10-CM | POA: Diagnosis not present

## 2017-01-28 DIAGNOSIS — Z0000 Encounter for general adult medical examination without abnormal findings: Secondary | ICD-10-CM | POA: Diagnosis not present

## 2017-01-28 DIAGNOSIS — N186 End stage renal disease: Secondary | ICD-10-CM

## 2017-01-28 DIAGNOSIS — Z114 Encounter for screening for human immunodeficiency virus [HIV]: Secondary | ICD-10-CM | POA: Diagnosis not present

## 2017-01-28 LAB — POCT GLYCOSYLATED HEMOGLOBIN (HGB A1C): Hemoglobin A1C: 7.8

## 2017-01-28 NOTE — Assessment & Plan Note (Signed)
Currently in sinus rhythm. Patient takes Eliquis. This may not be a good option for patient who has ESRD. Discussed switching to warfarin. Patient declines at this time and would like to think about further. Follows with cardiology so encouraged her to continue discussion with cardiologist. Continue carvedilol and Cardizem. Will check hemoglobin and renal function today.

## 2017-01-28 NOTE — Assessment & Plan Note (Signed)
Chronic long-standing. Has associated retinopathy, nephropathy, and neuropathy. A1c today 7.8. This is elevated from prior a year ago. Patient states that she has been making healthy eating choices. Denies any further hypoglycemic episodes where she has had in the past. Patient to take Lantus 20 units and Novolin 8 units 3 times a day with meals. Encouraged patient to follow-up in 3 months for A1c recheck. Patient to continue with CBG checks.

## 2017-01-28 NOTE — Assessment & Plan Note (Signed)
Blood pressure at goal. Continue current regimen. No refills needed today. Will collect blood work.

## 2017-01-28 NOTE — Progress Notes (Signed)
Subjective: Chief Complaint  Patient presents with  . Diabetes     HPI: [name] is a 61 y.o. 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 Hepatitis C Screening  1956-06-03  . HIV Screening  10/17/1970  . COLONOSCOPY  10/17/2005  . OPHTHALMOLOGY EXAM  08/25/2015  . FOOT EXAM  06/16/2016  . HEMOGLOBIN A1C  09/30/2016    ROS noted in HPI.   Past Medical, Surgical, Social, and Family History Reviewed & Updated per EMR.    History  Smoking Status  . Former Smoker  . Quit date: 08/27/1976  Smokeless Tobacco  . Never Used    Objective: BP 138/78   Pulse 67   Temp 97.8 F (36.6 C) (Oral)   Wt 225 lb (102.1 kg)   SpO2 98%   BMI 36.04 kg/m  Vitals and nursing notes reviewed  Physical Exam  Constitutional: She is oriented to person, place, and time and well-developed, well-nourished, and in no distress.  HENT:  Head: Normocephalic and atraumatic.  Nose: Nose normal.  Mouth/Throat: Oropharynx is clear and moist.  Eyes: Conjunctivae and EOM are normal. Pupils are equal, round, and reactive to light.  Neck: Normal range of motion. Neck supple. No thyromegaly present.  Cardiovascular: Normal rate, regular rhythm and normal  heart sounds.   Pulmonary/Chest: Effort normal and breath sounds normal.  Abdominal: Soft. She exhibits no distension. There is no tenderness. There is no guarding.  Musculoskeletal: Normal range of motion. She exhibits no edema.  Lymphadenopathy:    She has no cervical adenopathy.  Neurological: She is alert and oriented to person, place, and time. A sensory deficit is present. No cranial nerve deficit. Gait abnormal.  Skin: Skin is warm and dry.    Diabetic Foot Exam - Simple   Simple Foot Form Diabetic Foot exam was performed with the following findings:  Yes 01/28/2017  2:33 PM  Visual Inspection Sensation Testing Pulse Check Comments No sensation to bilateral feet on monofilament testing.      Results for orders placed or performed in visit on 01/28/17 (from the past 72 hour(s))  POCT glycosylated hemoglobin (Hb A1C)     Status: None   Collection Time: 01/28/17  1:45 PM  Result Value Ref Range   Hemoglobin A1C 7.8     Assessment/Plan: Please see problem based Assessment and Plan PATIENT EDUCATION PROVIDED: See AVS     Orders Placed This Encounter  Procedures  . Comprehensive metabolic panel    Order Specific Question:   Has the patient fasted?    Answer:   No  . HIV antibody  . Hepatitis C antibody  . CBC  . Lipid panel  . POCT glycosylated hemoglobin (Hb A1C)    Luiz Blare, DO 01/28/2017,  1:46 PM PGY-3, [place]

## 2017-01-28 NOTE — Assessment & Plan Note (Signed)
Continue statin. Will check cholesterol levels today.

## 2017-01-28 NOTE — Patient Instructions (Signed)
   Schedule colonoscopy  Follow-up with eye doctor and I will place referral as needed  Blood work collected will contact you about results  Diabetes medications: Lantus 20U and Novolin 8U with meals  Need to follow-up with cardiology  Things to do to Keep yourself Healthy - Exercise at least 30-45 minutes a day,  3-4 days a week.  - Eat a low-fat diet with lots of fruits and vegetables, up to 7-9 servings per day. - Seatbelts can save your life. Wear them always. - Smoke detectors on every level of your home, check batteries every year. - Eye Doctor - have an eye exam every 1-2 years - Safe sex - if you may be exposed to STDs, use a condom. - Alcohol If you drink, do it moderately,less than 2 drinks per day. - [place].  Choose someone to speak for you if you are not able. - Depression is common in our stressful world.If you're feeling down or losing interest in things you normally enjoy, please come in for a visit. - Violence - If anyone is threatening or hurting you, please call immediately.

## 2017-01-28 NOTE — Assessment & Plan Note (Signed)
   Diabetic foot exam performed today. Patient with diabetic neuropathy. Has no sensation to either feet bilaterally.  Collect HIV and hep C screening today  Patient encouraged to follow-up with GI doctor for colonoscopy  Has follow-up ophthalmology exam at the end of the month

## 2017-01-28 NOTE — Assessment & Plan Note (Signed)
Follows with nephrology. ESRD most likely secondary to diabetes and hypertension. Patient currently declining hemodialysis and transplant. Does not give any specific reasons why to me. Is having her labs monitored by nephrology. Will continue to follow along.

## 2017-01-29 LAB — COMPREHENSIVE METABOLIC PANEL WITH GFR
ALT: 21 IU/L (ref 0–32)
AST: 25 IU/L (ref 0–40)
Albumin/Globulin Ratio: 1.2 (ref 1.2–2.2)
Albumin: 4.1 g/dL (ref 3.6–4.8)
Alkaline Phosphatase: 88 IU/L (ref 39–117)
BUN/Creatinine Ratio: 11 — ABNORMAL LOW (ref 12–28)
BUN: 27 mg/dL (ref 8–27)
Bilirubin Total: <0.2 mg/dL (ref 0.0–1.2)
CO2: 17 mmol/L — ABNORMAL LOW (ref 18–29)
Calcium: 9.5 mg/dL (ref 8.7–10.3)
Chloride: 104 mmol/L (ref 96–106)
Creatinine, Ser: 2.53 mg/dL — ABNORMAL HIGH (ref 0.57–1.00)
GFR calc Af Amer: 23 mL/min/1.73 — ABNORMAL LOW (ref >59)
GFR calc non Af Amer: 20 mL/min/1.73 — ABNORMAL LOW (ref >59)
Globulin, Total: 3.4 g/dL (ref 1.5–4.5)
Glucose: 103 mg/dL — ABNORMAL HIGH (ref 65–99)
Potassium: 4.9 mmol/L (ref 3.5–5.2)
Sodium: 144 mmol/L (ref 134–144)
Total Protein: 7.5 g/dL (ref 6.0–8.5)

## 2017-01-29 LAB — CBC
HEMOGLOBIN: 10 g/dL — AB (ref 11.1–15.9)
Hematocrit: 31 % — ABNORMAL LOW (ref 34.0–46.6)
MCH: 26.6 pg (ref 26.6–33.0)
MCHC: 32.3 g/dL (ref 31.5–35.7)
MCV: 82 fL (ref 79–97)
PLATELETS: 333 10*3/uL (ref 150–379)
RBC: 3.76 x10E6/uL — ABNORMAL LOW (ref 3.77–5.28)
RDW: 15.6 % — AB (ref 12.3–15.4)
WBC: 6.2 10*3/uL (ref 3.4–10.8)

## 2017-01-29 LAB — LIPID PANEL
CHOLESTEROL TOTAL: 161 mg/dL (ref 100–199)
Chol/HDL Ratio: 3.2 ratio (ref 0.0–4.4)
HDL: 50 mg/dL (ref >39)
LDL CALC: 85 mg/dL (ref 0–99)
TRIGLYCERIDES: 128 mg/dL (ref 0–149)
VLDL Cholesterol Cal: 26 mg/dL (ref 5–40)

## 2017-01-29 LAB — HIV ANTIBODY (ROUTINE TESTING W REFLEX): HIV Screen 4th Generation wRfx: NONREACTIVE

## 2017-01-29 LAB — HEPATITIS C ANTIBODY

## 2017-01-30 ENCOUNTER — Encounter: Payer: Self-pay | Admitting: Obstetrics and Gynecology

## 2017-02-19 DIAGNOSIS — H40013 Open angle with borderline findings, low risk, bilateral: Secondary | ICD-10-CM | POA: Diagnosis not present

## 2017-02-19 DIAGNOSIS — E119 Type 2 diabetes mellitus without complications: Secondary | ICD-10-CM | POA: Diagnosis not present

## 2017-02-19 DIAGNOSIS — H26491 Other secondary cataract, right eye: Secondary | ICD-10-CM | POA: Diagnosis not present

## 2017-02-19 DIAGNOSIS — E113519 Type 2 diabetes mellitus with proliferative diabetic retinopathy with macular edema, unspecified eye: Secondary | ICD-10-CM | POA: Diagnosis not present

## 2017-02-19 LAB — HM DIABETES EYE EXAM

## 2017-02-27 ENCOUNTER — Encounter (HOSPITAL_COMMUNITY): Payer: Self-pay | Admitting: Emergency Medicine

## 2017-02-27 ENCOUNTER — Emergency Department (HOSPITAL_COMMUNITY)
Admission: EM | Admit: 2017-02-27 | Discharge: 2017-02-27 | Disposition: A | Discharge status: Home / Self Care | Payer: Medicare HMO | Attending: Emergency Medicine | Admitting: Emergency Medicine

## 2017-02-27 DIAGNOSIS — M545 Low back pain: Secondary | ICD-10-CM | POA: Insufficient documentation

## 2017-02-27 DIAGNOSIS — Z5321 Procedure and treatment not carried out due to patient leaving prior to being seen by health care provider: Secondary | ICD-10-CM | POA: Diagnosis not present

## 2017-02-27 DIAGNOSIS — M549 Dorsalgia, unspecified: Secondary | ICD-10-CM | POA: Diagnosis not present

## 2017-02-27 DIAGNOSIS — M546 Pain in thoracic spine: Secondary | ICD-10-CM | POA: Diagnosis not present

## 2017-02-27 NOTE — ED Triage Notes (Signed)
Pt called for a room with no answer

## 2017-02-27 NOTE — ED Triage Notes (Signed)
Pt BIB by EMS with R lower back pain x 2 days. Pt also c/o numbness in L hand x 2 days. Pt able to ambulate, equal strength to bilateral upper and lower extremities.

## 2017-03-14 ENCOUNTER — Encounter: Payer: Self-pay | Admitting: Obstetrics and Gynecology

## 2017-03-19 IMAGING — MG DIGITAL SCREENING BILATERAL MAMMOGRAM WITH CAD
4 series · 4 of 4 positions shown · non-contrast
Comparison: Previous exam(s).

CLINICAL DATA: Screening.

EXAM:
DIGITAL SCREENING BILATERAL MAMMOGRAM WITH CAD

[R CC]
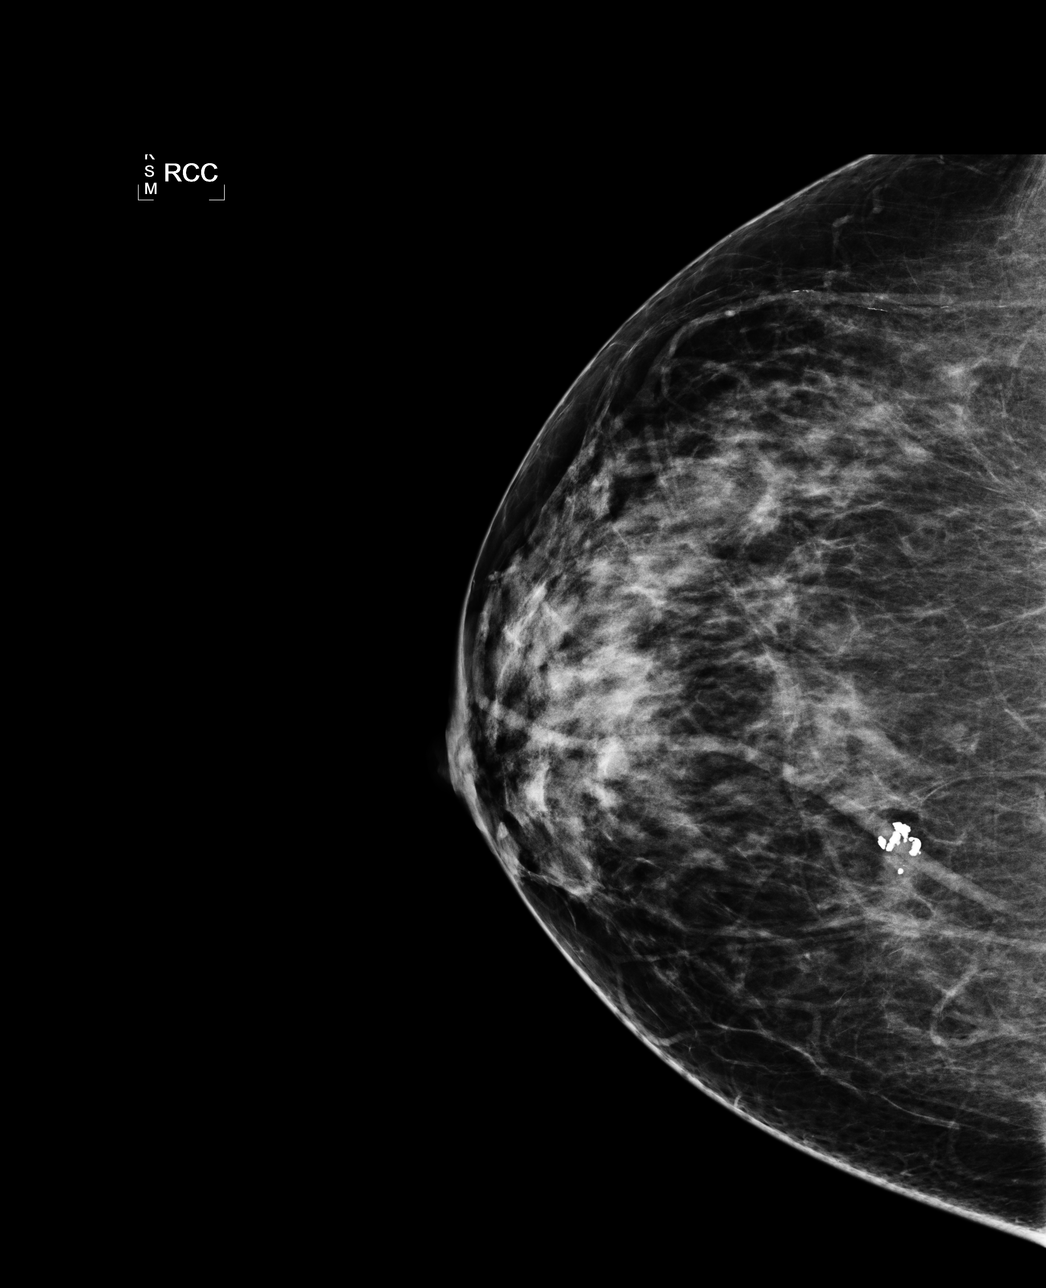

[L CC]
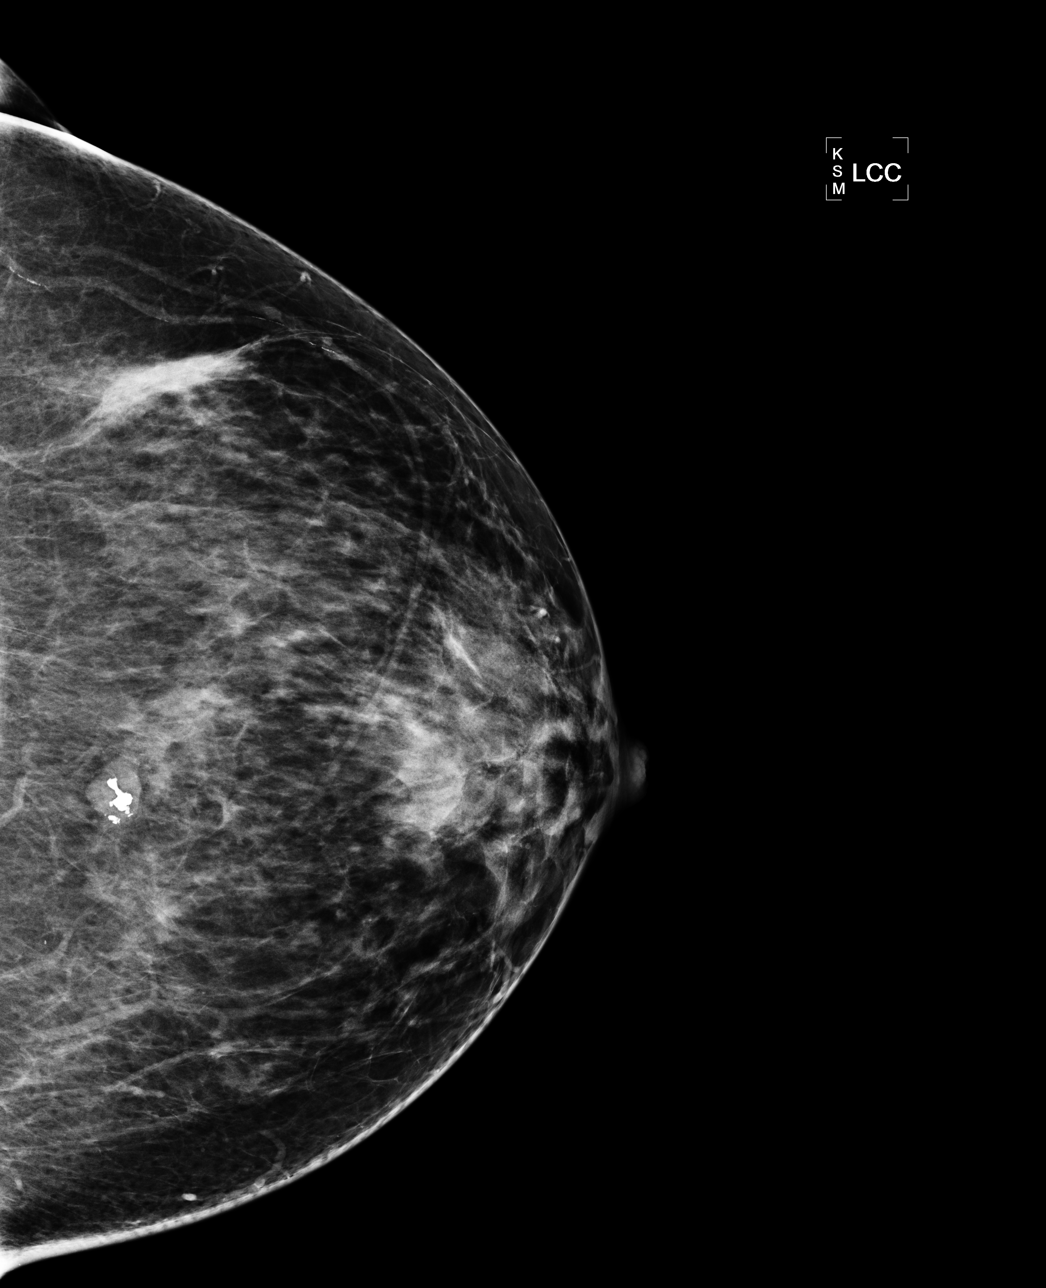

[L MLO]
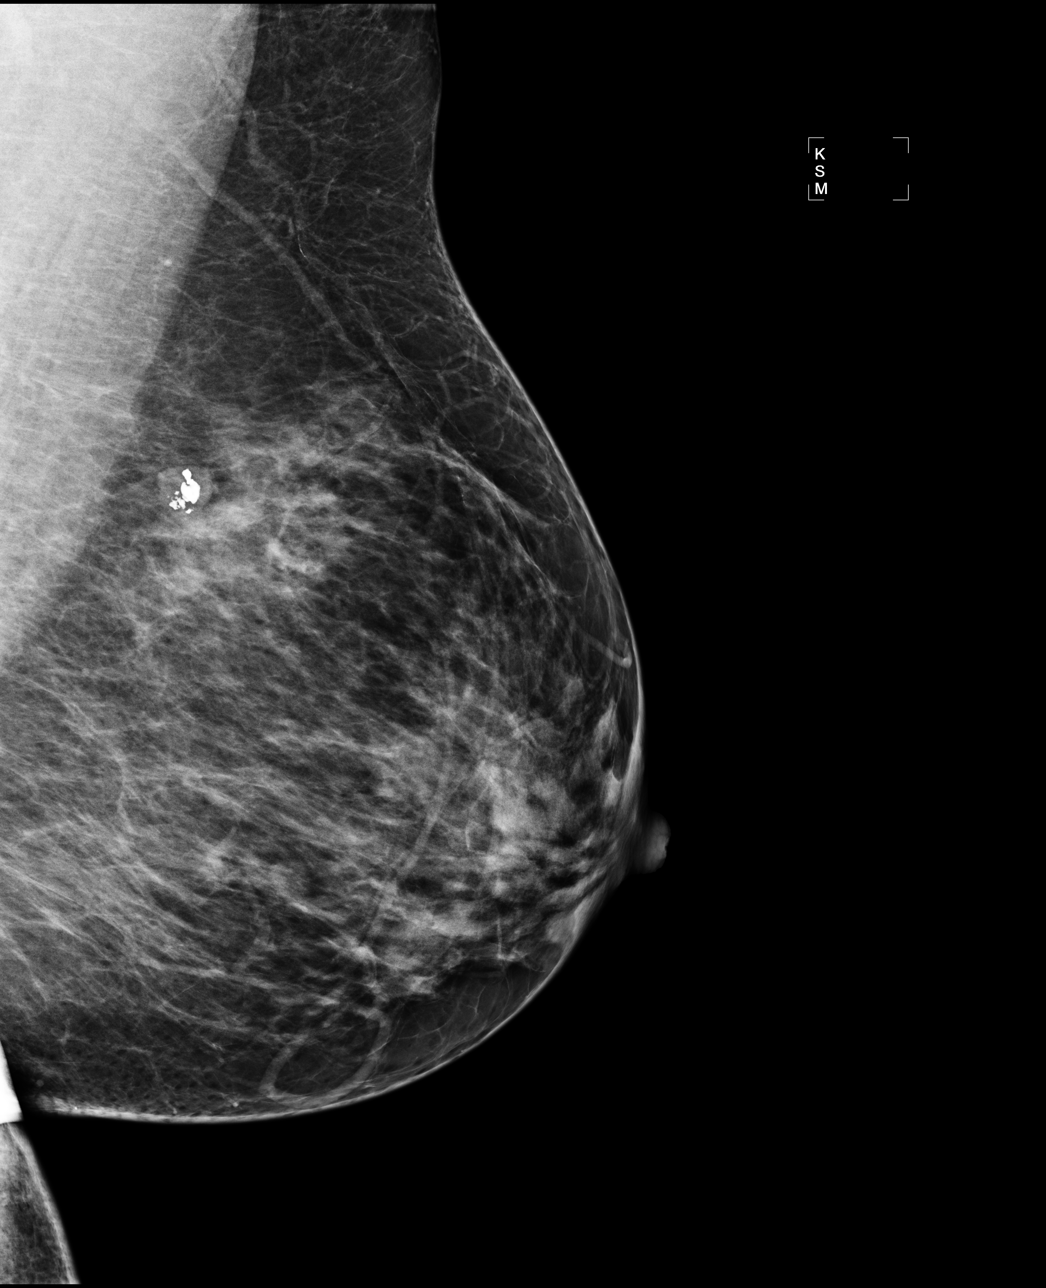

[R MLO]
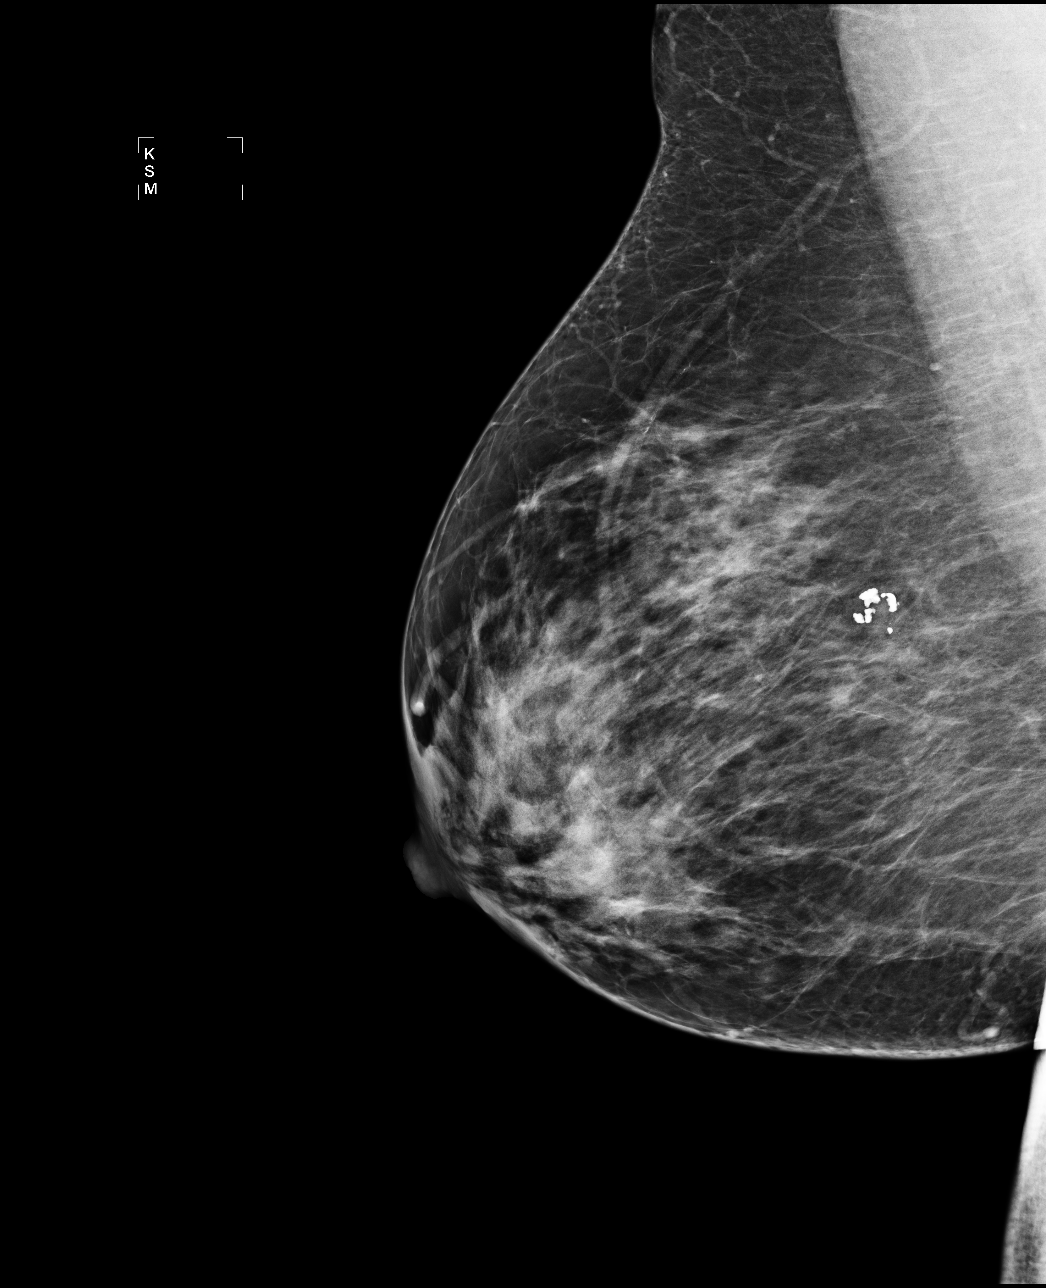

[4 of 4 positions shown; findings below may reference images not displayed]

ACR Breast Density Category c: The breast tissue is heterogeneously
dense, which may obscure small masses.
FINDINGS: There are no findings suspicious for malignancy. Images were
processed with CAD.
IMPRESSION: No mammographic evidence of malignancy. A result letter of this
screening mammogram will be mailed directly to the patient.

RECOMMENDATION:
Screening mammogram in one year. (Code:[id number])

BI-RADS CATEGORY  1: Negative.

## 2017-03-20 ENCOUNTER — Other Ambulatory Visit: Payer: Self-pay | Admitting: Obstetrics and Gynecology

## 2017-03-25 ENCOUNTER — Other Ambulatory Visit: Payer: Self-pay | Admitting: Obstetrics and Gynecology

## 2017-05-16 ENCOUNTER — Other Ambulatory Visit: Payer: Self-pay | Admitting: *Deleted

## 2017-05-16 ENCOUNTER — Other Ambulatory Visit: Payer: Self-pay | Admitting: Obstetrics and Gynecology

## 2017-05-16 MED ORDER — INSULIN REGULAR HUMAN 100 UNIT/ML IJ SOLN: 5.0000 [IU] | Freq: Three times a day (TID) | INTRAMUSCULAR | 5 refills | Status: DC

## 2017-05-16 NOTE — Telephone Encounter (Signed)
Patient called request refill on Novolin. Advised patient that she needed to be seen for follow up on DM. Appointment scheduled for 06/05/17 at 1:30. Derl Barrow, RN

## 2017-05-28 ENCOUNTER — Other Ambulatory Visit: Payer: Self-pay | Admitting: Obstetrics and Gynecology

## 2017-06-05 ENCOUNTER — Ambulatory Visit (INDEPENDENT_AMBULATORY_CARE_PROVIDER_SITE_OTHER): Payer: Medicare HMO | Admitting: Family Medicine

## 2017-06-05 ENCOUNTER — Encounter: Payer: Self-pay | Admitting: Family Medicine

## 2017-06-05 VITALS — BP 138/72 | HR 64 | Temp 98.7°F | Wt 223.0 lb

## 2017-06-05 DIAGNOSIS — F4321 Adjustment disorder with depressed mood: Secondary | ICD-10-CM | POA: Diagnosis not present

## 2017-06-05 DIAGNOSIS — Z794 Long term (current) use of insulin: Secondary | ICD-10-CM

## 2017-06-05 DIAGNOSIS — Z23 Encounter for immunization: Secondary | ICD-10-CM

## 2017-06-05 DIAGNOSIS — E118 Type 2 diabetes mellitus with unspecified complications: Secondary | ICD-10-CM | POA: Diagnosis not present

## 2017-06-05 DIAGNOSIS — Z634 Disappearance and death of family member: Secondary | ICD-10-CM

## 2017-06-05 LAB — POCT GLYCOSYLATED HEMOGLOBIN (HGB A1C): Hemoglobin A1C: 8.8

## 2017-06-05 MED ORDER — INSULIN GLARGINE 100 UNIT/ML ~~LOC~~ SOLN: 5.0000 [IU] | Freq: Every day | SUBCUTANEOUS | 1 refills | Status: DC

## 2017-06-05 NOTE — Patient Instructions (Signed)
   It was nice to see you today. I'm sorry you're having a hard time. If you would like to talk more or would like to discuss some resources in the area for grief counseling please call me.   Please start taking 5 units Lantus every night. Call me if you have low sugars or high sugars.   If you have questions or concerns please do not hesitate to call at [phone].  Lucila Maine, DO PGY-2, [facility] 06/05/2017 1:59 PM

## 2017-06-05 NOTE — Assessment & Plan Note (Signed)
  Chronic, poorly controlled. A1C worsened today to 8.8 up from 7.8. Patient feels this is secondary to stress since son passed away a month ago.   -continue novolog 10-12 units with meals -add back Lantus 5 units every night  -instructed patient to call me with complications, low or high sugars -Patient verbalized understanding and agreement with plan -follow up 3 months

## 2017-06-05 NOTE — Assessment & Plan Note (Signed)
  Son passed away 1 month ago. Patient tearful and reports difficulty coping, decreased appetite. Appropriate reaction to losing child. No SI.  -declines [place] today  -asked patient to call if she would like to discuss this further, seek out grief counseling -follow up as needed

## 2017-06-05 NOTE — Progress Notes (Signed)
    Subjective:    Patient ID: [name], female    DOB: March 15, 1956, 61 y.o.   MRN: [id number]   CC: DM follow up  DM Currently taking Novolog 10-12 units based on sugars. Will eat, check sugars about 30 minutes after, sugars have been running in the 200's. Never higher. Had one low sugar of 60- felt sweaty, some confusion, abdominal pain. Drank some juice to bring it back up.   Was taking Lantus 10 units but she stopped this. She felt like she was having swelling in her feet. She failed actos and metformin. She also stopped taking Dilt due to feet swelling.   Stress- son died last month. Patient did not want to elaborate on details. She lives with 3 other sons and is able to talk with them and have support in them. Denies SI.  Smoking status reviewed- former smoker  Review of Systems- see HPI  Objective:  BP 138/72   Pulse 64   Temp 98.7 F (37.1 C) (Oral)   Wt 223 lb (101.2 kg)   SpO2 98%   BMI 35.72 kg/m  Vitals and nursing note reviewed  General: well nourished, in no acute distress Neck: supple, non-tender, without lymphadenopathy Cardiac: RRR, clear S1 and S2, no murmurs, rubs, or gallops Respiratory: clear to auscultation bilaterally, no increased work of breathing Extremities: Right ankle swollen vs left Skin: warm and dry, no rashes noted Neuro: alert and oriented, no focal deficits Psych: mood is sad, tearful. Appropriate affect.  Assessment & Plan:    Diabetes mellitus  Chronic, poorly controlled. A1C worsened today to 8.8 up from 7.8. Patient feels this is secondary to stress since son passed away a month ago.   -continue novolog 10-12 units with meals -add back Lantus 5 units every night  -instructed patient to call me with complications, low or high sugars -Patient verbalized understanding and agreement with plan -follow up 3 months  Grief at loss of child  Son passed away 1 month ago. Patient tearful and reports difficulty coping, decreased  appetite. Appropriate reaction to losing child. No SI.  -declines [facility] today  -asked patient to call if she would like to discuss this further, seek out grief counseling -follow up as needed     Return in about 3 months (around 09/04/2017).   Lucila Maine, DO Family Medicine Resident PGY-2

## 2017-06-13 DIAGNOSIS — E1129 Type 2 diabetes mellitus with other diabetic kidney complication: Secondary | ICD-10-CM | POA: Diagnosis not present

## 2017-06-13 DIAGNOSIS — N184 Chronic kidney disease, stage 4 (severe): Secondary | ICD-10-CM | POA: Diagnosis not present

## 2017-06-13 DIAGNOSIS — Z6835 Body mass index (BMI) 35.0-35.9, adult: Secondary | ICD-10-CM | POA: Diagnosis not present

## 2017-06-13 DIAGNOSIS — D631 Anemia in chronic kidney disease: Secondary | ICD-10-CM | POA: Diagnosis not present

## 2017-06-13 DIAGNOSIS — I129 Hypertensive chronic kidney disease with stage 1 through stage 4 chronic kidney disease, or unspecified chronic kidney disease: Secondary | ICD-10-CM | POA: Diagnosis not present

## 2017-06-13 DIAGNOSIS — N2581 Secondary hyperparathyroidism of renal origin: Secondary | ICD-10-CM | POA: Diagnosis not present

## 2017-06-21 ENCOUNTER — Telehealth: Payer: Self-pay | Admitting: *Deleted

## 2017-06-21 ENCOUNTER — Other Ambulatory Visit: Payer: Self-pay | Admitting: Family Medicine

## 2017-06-21 MED ORDER — LOSARTAN POTASSIUM 50 MG PO TABS: 50.0000 mg | ORAL_TABLET | Freq: Every day | ORAL | 3 refills | Status: DC

## 2017-06-21 MED ORDER — LOSARTAN POTASSIUM 50 MG PO TABS: 50.0000 mg | ORAL_TABLET | Freq: Every day | ORAL | 0 refills | Status: DC

## 2017-06-21 NOTE — Telephone Encounter (Signed)
Please inform patient I sent in a new prescription to her pharmacy called losartan, it is in the same class as valsartan. She can switch to this new medication. Thanks.

## 2017-06-21 NOTE — Telephone Encounter (Signed)
Patient is aware that script was sent to [facility] but also requested that a 30 day supply be sent to [facility] so she can get it today.  Sent this medication in for patient. Yashira Offenberger,CMA

## 2017-06-21 NOTE — Telephone Encounter (Signed)
Patient left message on nurse line requesting to discontinue valsartan. She is requesting a new blood pressure medication. Valsartan is on recall.  Please give her a call. Number on file is correct.  Derl Barrow, RN

## 2017-06-25 ENCOUNTER — Ambulatory Visit: Payer: Medicare HMO | Admitting: *Deleted

## 2017-07-19 ENCOUNTER — Other Ambulatory Visit: Payer: Self-pay | Admitting: Family Medicine

## 2017-07-31 ENCOUNTER — Other Ambulatory Visit: Payer: Self-pay | Admitting: Family Medicine

## 2017-09-05 DIAGNOSIS — E113519 Type 2 diabetes mellitus with proliferative diabetic retinopathy with macular edema, unspecified eye: Secondary | ICD-10-CM | POA: Diagnosis not present

## 2017-09-05 DIAGNOSIS — E119 Type 2 diabetes mellitus without complications: Secondary | ICD-10-CM | POA: Diagnosis not present

## 2017-09-05 DIAGNOSIS — R6889 Other general symptoms and signs: Secondary | ICD-10-CM | POA: Diagnosis not present

## 2017-09-05 DIAGNOSIS — H40013 Open angle with borderline findings, low risk, bilateral: Secondary | ICD-10-CM | POA: Diagnosis not present

## 2017-09-05 DIAGNOSIS — Z961 Presence of intraocular lens: Secondary | ICD-10-CM | POA: Diagnosis not present

## 2017-09-05 LAB — HM DIABETES EYE EXAM

## 2017-09-12 DIAGNOSIS — N184 Chronic kidney disease, stage 4 (severe): Secondary | ICD-10-CM | POA: Diagnosis not present

## 2017-09-12 DIAGNOSIS — E1129 Type 2 diabetes mellitus with other diabetic kidney complication: Secondary | ICD-10-CM | POA: Diagnosis not present

## 2017-09-12 DIAGNOSIS — Z6835 Body mass index (BMI) 35.0-35.9, adult: Secondary | ICD-10-CM | POA: Diagnosis not present

## 2017-09-12 DIAGNOSIS — I129 Hypertensive chronic kidney disease with stage 1 through stage 4 chronic kidney disease, or unspecified chronic kidney disease: Secondary | ICD-10-CM | POA: Diagnosis not present

## 2017-09-12 DIAGNOSIS — N2581 Secondary hyperparathyroidism of renal origin: Secondary | ICD-10-CM | POA: Diagnosis not present

## 2017-09-12 DIAGNOSIS — D631 Anemia in chronic kidney disease: Secondary | ICD-10-CM | POA: Diagnosis not present

## 2017-09-16 ENCOUNTER — Other Ambulatory Visit: Payer: Self-pay | Admitting: Family Medicine

## 2017-10-23 ENCOUNTER — Other Ambulatory Visit: Payer: Self-pay | Admitting: Family Medicine

## 2017-10-24 ENCOUNTER — Encounter: Payer: Self-pay | Admitting: Family Medicine

## 2017-11-07 ENCOUNTER — Ambulatory Visit (INDEPENDENT_AMBULATORY_CARE_PROVIDER_SITE_OTHER): Payer: Medicare HMO | Admitting: Family Medicine

## 2017-11-07 ENCOUNTER — Encounter: Payer: Self-pay | Admitting: Family Medicine

## 2017-11-07 VITALS — BP 140/80 | HR 64 | Temp 97.8°F | Wt 238.2 lb

## 2017-11-07 DIAGNOSIS — Z794 Long term (current) use of insulin: Secondary | ICD-10-CM

## 2017-11-07 DIAGNOSIS — N186 End stage renal disease: Secondary | ICD-10-CM | POA: Diagnosis not present

## 2017-11-07 DIAGNOSIS — R05 Cough: Secondary | ICD-10-CM

## 2017-11-07 DIAGNOSIS — R059 Cough, unspecified: Secondary | ICD-10-CM

## 2017-11-07 DIAGNOSIS — Z1211 Encounter for screening for malignant neoplasm of colon: Secondary | ICD-10-CM | POA: Diagnosis not present

## 2017-11-07 DIAGNOSIS — I10 Essential (primary) hypertension: Secondary | ICD-10-CM | POA: Diagnosis not present

## 2017-11-07 DIAGNOSIS — E118 Type 2 diabetes mellitus with unspecified complications: Secondary | ICD-10-CM | POA: Diagnosis not present

## 2017-11-07 DIAGNOSIS — R109 Unspecified abdominal pain: Secondary | ICD-10-CM | POA: Diagnosis not present

## 2017-11-07 DIAGNOSIS — Z1231 Encounter for screening mammogram for malignant neoplasm of breast: Secondary | ICD-10-CM

## 2017-11-07 DIAGNOSIS — R6889 Other general symptoms and signs: Secondary | ICD-10-CM | POA: Diagnosis not present

## 2017-11-07 DIAGNOSIS — Z1239 Encounter for other screening for malignant neoplasm of breast: Secondary | ICD-10-CM

## 2017-11-07 DIAGNOSIS — E785 Hyperlipidemia, unspecified: Secondary | ICD-10-CM

## 2017-11-07 NOTE — Patient Instructions (Signed)
   It was nice to see you today!  Please take your hydralazine 3 times a day.  Please decrease the amount of Lantus your taking from 10 units to 8 units and see if this helps with low blood sugars at night.  We will let you know your blood work results.   If you have questions or concerns please do not hesitate to call at [phone].  Lucila Maine, DO PGY-2, [facility] 11/07/2017 2:03 PM

## 2017-11-07 NOTE — Progress Notes (Signed)
Subjective:    Patient ID: [name], female    DOB: 07-05-1956, 62 y.o.   MRN: [id number]   CC: follow up DM, HTN  DM- reports she has changed how she takes her insulin. Takes 8 units lantus every day and 7-12 units novolog w/ meals depending on what she eats. She reports she has had some low blood sugars as low as 45 at night time. She reports feeling shaky and has upset stomach when this happens. She denies increased thirst, urination, hunger.   HTN- taking hydralazine 100 mg 2-3 times a day. She is also on coreg. Her nephrologist stopped her losartan.   Also complains of cough since she had pneumonia/the flu earlier this winter that is persistent and nagging. She occasionally coughs up white phlegm. She denies fevers, chills, SOB, DOE, orthopnea, PND.   She lastly would like a "blood test to check for stomach cancer" because her friend was diagnosed w/ stomach cancer on a blood test. She reports occasionally her stomach feels "raw" and points to epigastric area when asked to localize pain. It does not get better or worse with foods. It is not associated with diarrhea or nausea/vomiting. She has not had unintentional weight loss. No bloody or dark tarry stools.   Smoking status reviewed- former smoker  Review of Systems- see HPI   Objective:  BP 140/80 (BP Location: Left Arm, Patient Position: Sitting, Cuff Size: Large)   Pulse 64   Temp 97.8 F (36.6 C) (Oral)   Wt 238 lb 3.2 oz (108 kg)   SpO2 98%   BMI 38.16 kg/m  Vitals and nursing note reviewed  General: elderly woman, well nourished, in no acute distress HEENT: normocephalic, no scleral icterus or conjunctival pallor, moist mucous membranes, good dentition without erythema or discharge noted in posterior oropharynx Neck: supple, non-tender, without lymphadenopathy Cardiac: RRR, clear S1 and S2, no murmurs, rubs, or gallops Respiratory: clear to auscultation bilaterally, no increased work of breathing Abdomen:  obese abdomen. Soft, NTND, +BS Extremities: no edema or cyanosis.  Neuro: alert and oriented, no focal deficits   Assessment & Plan:    1. Type 2 diabetes mellitus with complication, with long-term current use of insulin ([place]) Advised patient cut down on her lantus dose as she is getting hypoglycemic at night. Discussed risks of coma w/ low blood sugars. Patient sounded hesitant and did not agree with cutting Lantus down below 8 units. Encouraged her to keep a close eye on her sugars. Ideally would like them to be 80-100 range, no lower. Check A1C today, follow up 3 months - Hemoglobin A1c  2. Hyperlipidemia, unspecified hyperlipidemia type Check lipid panel today - Lipid panel  3. Essential hypertension Check electrolytes, kidney function, blood counts today - CMP14+EGFR - CBC  4. ESRD (end stage renal disease) ([place]) Check kidney function today - CMP14+EGFR  5. Screening breast examination Referral placed for mammogram, patient agreeable to this - MM Digital Screening; Future  6. Screening for colon cancer Referral placed to GI for colonoscopy, patient has not had one before and she is open to getting one done to screen for colon cancer. Discussed we do not do blood tests to dx colon cancer.  - Ambulatory referral to Gastroenterology  7. Cough Likely post-viral cough, no concern for infection, she is well appearing, denies SOB, no fevers, chills. Reassured patients.   8. Stomach pain Unclear, could be stomach ulcer. Offered trial of antacid but patient did not want to take anything  due to side effects. She has not had any blood in stool, no weight loss. Follow up if symptoms worsen or fail to improve.  Return in about 3 months (around 02/04/2018).   Lucila Maine, DO Family Medicine Resident PGY-2

## 2017-11-08 LAB — CMP14+EGFR
A/G RATIO: 1.3 (ref 1.2–2.2)
ALBUMIN: 4 g/dL (ref 3.6–4.8)
ALK PHOS: 86 IU/L (ref 39–117)
ALT: 19 IU/L (ref 0–32)
AST: 21 IU/L (ref 0–40)
BUN / CREAT RATIO: 15 (ref 12–28)
BUN: 41 mg/dL — ABNORMAL HIGH (ref 8–27)
Bilirubin Total: 0.2 mg/dL (ref 0.0–1.2)
CO2: 25 mmol/L (ref 20–29)
CREATININE: 2.79 mg/dL — AB (ref 0.57–1.00)
Calcium: 9.1 mg/dL (ref 8.7–10.3)
Chloride: 100 mmol/L (ref 96–106)
GFR calc Af Amer: 20 mL/min/{1.73_m2} — ABNORMAL LOW (ref >59)
GFR, EST NON AFRICAN AMERICAN: 18 mL/min/{1.73_m2} — AB (ref >59)
GLOBULIN, TOTAL: 3 g/dL (ref 1.5–4.5)
Glucose: 105 mg/dL — ABNORMAL HIGH (ref 65–99)
POTASSIUM: 4.3 mmol/L (ref 3.5–5.2)
SODIUM: 141 mmol/L (ref 134–144)
Total Protein: 7 g/dL (ref 6.0–8.5)

## 2017-11-08 LAB — LIPID PANEL
CHOL/HDL RATIO: 2.9 ratio (ref 0.0–4.4)
CHOLESTEROL TOTAL: 140 mg/dL (ref 100–199)
HDL: 49 mg/dL (ref >39)
LDL CALC: 74 mg/dL (ref 0–99)
TRIGLYCERIDES: 84 mg/dL (ref 0–149)
VLDL Cholesterol Cal: 17 mg/dL (ref 5–40)

## 2017-11-08 LAB — CBC
HEMATOCRIT: 28.9 % — AB (ref 34.0–46.6)
HEMOGLOBIN: 9.5 g/dL — AB (ref 11.1–15.9)
MCH: 26.2 pg — ABNORMAL LOW (ref 26.6–33.0)
MCHC: 32.9 g/dL (ref 31.5–35.7)
MCV: 80 fL (ref 79–97)
Platelets: 317 10*3/uL (ref 150–379)
RBC: 3.63 x10E6/uL — AB (ref 3.77–5.28)
RDW: 15.4 % (ref 12.3–15.4)
WBC: 5.8 10*3/uL (ref 3.4–10.8)

## 2017-11-08 LAB — HEMOGLOBIN A1C
ESTIMATED AVERAGE GLUCOSE: 171 mg/dL
HEMOGLOBIN A1C: 7.6 % — AB (ref 4.8–5.6)

## 2017-11-15 ENCOUNTER — Telehealth: Payer: Self-pay | Admitting: Family Medicine

## 2017-11-15 NOTE — Telephone Encounter (Signed)
  Called Ms. Linden to discuss lab results. A1C improved to 7.6 from 8.8. She has decreased insulin like we talked about at last visit. Sugars have been "okay". She reports a low sugar last night. Otherwise they have been <120. Other labs stable. She sees nephrology for CKD already. Anemia stable. Cholesterol looks good. She is appreciative of call.   Lucila Maine, DO PGY-2, [facility] 11/15/2017 11:42 AM

## 2017-12-06 ENCOUNTER — Ambulatory Visit: Payer: Medicare HMO | Admitting: Physician Assistant

## 2017-12-17 DIAGNOSIS — R6889 Other general symptoms and signs: Secondary | ICD-10-CM | POA: Diagnosis not present

## 2017-12-17 DIAGNOSIS — Z6835 Body mass index (BMI) 35.0-35.9, adult: Secondary | ICD-10-CM | POA: Diagnosis not present

## 2017-12-17 DIAGNOSIS — N2581 Secondary hyperparathyroidism of renal origin: Secondary | ICD-10-CM | POA: Diagnosis not present

## 2017-12-17 DIAGNOSIS — N184 Chronic kidney disease, stage 4 (severe): Secondary | ICD-10-CM | POA: Diagnosis not present

## 2017-12-17 DIAGNOSIS — I129 Hypertensive chronic kidney disease with stage 1 through stage 4 chronic kidney disease, or unspecified chronic kidney disease: Secondary | ICD-10-CM | POA: Diagnosis not present

## 2017-12-17 DIAGNOSIS — E1122 Type 2 diabetes mellitus with diabetic chronic kidney disease: Secondary | ICD-10-CM | POA: Diagnosis not present

## 2017-12-17 DIAGNOSIS — D631 Anemia in chronic kidney disease: Secondary | ICD-10-CM | POA: Diagnosis not present

## 2018-01-14 ENCOUNTER — Telehealth: Payer: Self-pay

## 2018-01-14 ENCOUNTER — Other Ambulatory Visit: Payer: Self-pay | Admitting: Family Medicine

## 2018-01-14 DIAGNOSIS — Z1211 Encounter for screening for malignant neoplasm of colon: Secondary | ICD-10-CM

## 2018-01-14 DIAGNOSIS — Z139 Encounter for screening, unspecified: Secondary | ICD-10-CM

## 2018-01-14 NOTE — Telephone Encounter (Signed)
Referral made for colonoscopy.  Lucila Maine, DO PGY-2, [facility] 01/14/2018 7:09 PM

## 2018-01-14 NOTE — Telephone Encounter (Signed)
Patient left message that she needs a referral for a colonoscopy. Call back is [phone].  Danley Danker, RN [facility] RN)

## 2018-01-16 ENCOUNTER — Other Ambulatory Visit: Payer: Self-pay

## 2018-01-17 MED ORDER — "INSULIN SYRINGE-NEEDLE U-100 31G X 5/16"" 0.5 ML MISC": 8 refills | Status: DC

## 2018-02-04 ENCOUNTER — Ambulatory Visit
Admission: RE | Admit: 2018-02-04 | Discharge: 2018-02-04 | Disposition: A | Discharge status: Home / Self Care | Payer: Medicare HMO | Source: Ambulatory Visit | Attending: Family Medicine | Admitting: Family Medicine

## 2018-02-04 DIAGNOSIS — Z1231 Encounter for screening mammogram for malignant neoplasm of breast: Secondary | ICD-10-CM | POA: Diagnosis not present

## 2018-02-04 DIAGNOSIS — Z139 Encounter for screening, unspecified: Secondary | ICD-10-CM

## 2018-02-04 DIAGNOSIS — R6889 Other general symptoms and signs: Secondary | ICD-10-CM | POA: Diagnosis not present

## 2018-02-16 DIAGNOSIS — E161 Other hypoglycemia: Secondary | ICD-10-CM | POA: Diagnosis not present

## 2018-03-11 DIAGNOSIS — E1122 Type 2 diabetes mellitus with diabetic chronic kidney disease: Secondary | ICD-10-CM | POA: Diagnosis not present

## 2018-03-11 DIAGNOSIS — D631 Anemia in chronic kidney disease: Secondary | ICD-10-CM | POA: Diagnosis not present

## 2018-03-11 DIAGNOSIS — N2581 Secondary hyperparathyroidism of renal origin: Secondary | ICD-10-CM | POA: Diagnosis not present

## 2018-03-11 DIAGNOSIS — I129 Hypertensive chronic kidney disease with stage 1 through stage 4 chronic kidney disease, or unspecified chronic kidney disease: Secondary | ICD-10-CM | POA: Diagnosis not present

## 2018-03-11 DIAGNOSIS — N184 Chronic kidney disease, stage 4 (severe): Secondary | ICD-10-CM | POA: Diagnosis not present

## 2018-03-11 DIAGNOSIS — Z6835 Body mass index (BMI) 35.0-35.9, adult: Secondary | ICD-10-CM | POA: Diagnosis not present

## 2018-03-19 ENCOUNTER — Other Ambulatory Visit: Payer: Self-pay | Admitting: Family Medicine

## 2018-03-21 ENCOUNTER — Ambulatory Visit (INDEPENDENT_AMBULATORY_CARE_PROVIDER_SITE_OTHER): Payer: Medicare HMO | Admitting: Family Medicine

## 2018-03-21 ENCOUNTER — Encounter: Payer: Self-pay | Admitting: Family Medicine

## 2018-03-21 ENCOUNTER — Other Ambulatory Visit: Payer: Self-pay

## 2018-03-21 VITALS — BP 102/70 | HR 64 | Temp 97.8°F | Wt 239.0 lb

## 2018-03-21 DIAGNOSIS — E118 Type 2 diabetes mellitus with unspecified complications: Secondary | ICD-10-CM | POA: Diagnosis not present

## 2018-03-21 DIAGNOSIS — R6889 Other general symptoms and signs: Secondary | ICD-10-CM | POA: Diagnosis not present

## 2018-03-21 DIAGNOSIS — Z1211 Encounter for screening for malignant neoplasm of colon: Secondary | ICD-10-CM

## 2018-03-21 DIAGNOSIS — Z794 Long term (current) use of insulin: Secondary | ICD-10-CM | POA: Diagnosis not present

## 2018-03-21 LAB — POCT GLYCOSYLATED HEMOGLOBIN (HGB A1C): HBA1C, POC (CONTROLLED DIABETIC RANGE): 8 % — AB (ref 0.0–7.0)

## 2018-03-21 MED ORDER — INSULIN SYRINGES (DISPOSABLE) U-100 0.5 ML MISC: 8 refills | Status: DC

## 2018-03-21 MED ORDER — APIXABAN 2.5 MG PO TABS: 2.5000 mg | ORAL_TABLET | Freq: Two times a day (BID) | ORAL | 3 refills | Status: DC

## 2018-03-21 NOTE — Patient Instructions (Addendum)
It was nice to see you today!  Please increase Lantus to 12 units. We'll see you back in 3 months.  If you have questions or concerns please do not hesitate to call at [phone].  Lucila Maine, DO PGY-2, [facility] 03/21/2018 3:46 PM    Diabetes and Foot Care Diabetes may cause you to have problems because of poor blood supply (circulation) to your feet and legs. This may cause the skin on your feet to become thinner, break easier, and heal more slowly. Your skin may become dry, and the skin may peel and crack. You may also have nerve damage in your legs and feet causing decreased feeling in them. You may not notice minor injuries to your feet that could lead to infections or more serious problems. Taking care of your feet is one of the most important things you can do for yourself. Follow these instructions at home:  Wear shoes at all times, even in the house. Do not go barefoot. Bare feet are easily injured.  Check your feet daily for blisters, cuts, and redness. If you cannot see the bottom of your feet, use a mirror or ask someone for help.  Wash your feet with warm water (do not use hot water) and mild soap. Then pat your feet and the areas between your toes until they are completely dry. Do not soak your feet as this can dry your skin.  Apply a moisturizing lotion or petroleum jelly (that does not contain alcohol and is unscented) to the skin on your feet and to dry, brittle toenails. Do not apply lotion between your toes.  Trim your toenails straight across. Do not dig under them or around the cuticle. File the edges of your nails with an emery board or nail file.  Do not cut corns or calluses or try to remove them with medicine.  Wear clean socks or stockings every day. Make sure they are not too tight. Do not wear knee-high stockings since they may decrease blood flow to your legs.  Wear shoes that fit properly and have enough cushioning. To break in new  shoes, wear them for just a few hours a day. This prevents you from injuring your feet. Always look in your shoes before you put them on to be sure there are no objects inside.  Do not cross your legs. This may decrease the blood flow to your feet.  If you find a minor scrape, cut, or break in the skin on your feet, keep it and the skin around it clean and dry. These areas may be cleansed with mild soap and water. Do not cleanse the area with peroxide, alcohol, or iodine.  When you remove an adhesive bandage, be sure not to damage the skin around it.  If you have a wound, look at it several times a day to make sure it is healing.  Do not use heating pads or hot water bottles. They may burn your skin. If you have lost feeling in your feet or legs, you may not know it is happening until it is too late.  Make sure your health care provider performs a complete foot exam at least annually or more often if you have foot problems. Report any cuts, sores, or bruises to your health care provider immediately. Contact a health care provider if:  You have an injury that is not healing.  You have cuts or breaks in the skin.  You have an ingrown nail.  You notice redness on your legs or feet.  You feel burning or tingling in your legs or feet.  You have pain or cramps in your legs and feet.  Your legs or feet are numb.  Your feet always feel cold. Get help right away if:  There is increasing redness, swelling, or pain in or around a wound.  There is a red line that goes up your leg.  Pus is coming from a wound.  You develop a fever or as directed by your health care provider.  You notice a bad smell coming from an ulcer or wound. This information is not intended to replace advice given to you by your health care provider. Make sure you discuss any questions you have with your health care provider. Document Released: 09/07/2000 Document Revised: 02/16/2016 Document Reviewed:  02/17/2013 Elsevier Interactive Patient Education  2017 [employer].

## 2018-03-21 NOTE — Progress Notes (Signed)
    Subjective:    Patient ID: [name], female    DOB: 25-Oct-1955, 62 y.o.   MRN: [id number]   CC: follow up DM  colonscopy  Cards  DM- Feels she is doing well. She is taking lantus 8-10 units (she has trouble getting pen to click accurately). She is also taking novolog 8-12 with meals depending on her CBGs. She reports lowest sugar 65, she felt a little shaky and ate for it to come back up. This has happened 1-2 times over past 3 months. She is otherwise doing well with CBGs around 150-180s. She has some high sugars at night depending on what she has eaten. Denies increased thirst, hunger, or urination. She has standing numbness in both feet up to her ankles and takes gabapentin.  She is due for colonoscopy and wants to do abdominal imaging instead of scope. She is asking about the most accurate way to screen for colon cancer. She has done FIT testing in past.   Smoking status reviewed- former smoker  Review of Systems- see HPI  Objective:  BP 102/70   Pulse 64   Temp 97.8 F (36.6 C) (Oral)   Wt 239 lb (108.4 kg)   SpO2 98%   BMI 38.29 kg/m  Vitals and nursing note reviewed  General: well nourished, in no acute distress HEENT: normocephalic, MMM Cardiac: RRR, clear S1 and S2, no murmurs, rubs, or gallops Respiratory: clear to auscultation bilaterally, no increased work of breathing Extremities: no edema or cyanosis. Skin: warm and dry, no rashes noted Neuro: alert and oriented, no focal deficits Psych: flat affect.  Assessment & Plan:   1. Type 2 diabetes mellitus with complication, with long-term current use of insulin ([facility]) A1C slightly increased today from 7.6 > 8.0. She has had infrequent lows. Plan to increase lantus to 12 units and continue her sliding scale with meals. Follow up 3 months for next A1C. Refilled insulin syringes. - HgB A1c  2. Special screening for malignant neoplasms, colon Discussed with patient that the most accurate way to screen for  colon cancer is visualizing colon with colonoscopy. Discussed no imaging method that is adequate to screen. Discussed FIT testing as well. Patient states she will consider doing the scope and let me know what she decides.   Return in about 3 months (around 06/21/2018) for DM.   Lucila Maine, DO Family Medicine Resident PGY-2

## 2018-04-02 DIAGNOSIS — Z961 Presence of intraocular lens: Secondary | ICD-10-CM | POA: Diagnosis not present

## 2018-04-02 DIAGNOSIS — H40013 Open angle with borderline findings, low risk, bilateral: Secondary | ICD-10-CM | POA: Diagnosis not present

## 2018-04-02 DIAGNOSIS — E113519 Type 2 diabetes mellitus with proliferative diabetic retinopathy with macular edema, unspecified eye: Secondary | ICD-10-CM | POA: Diagnosis not present

## 2018-04-02 DIAGNOSIS — E119 Type 2 diabetes mellitus without complications: Secondary | ICD-10-CM | POA: Diagnosis not present

## 2018-04-02 LAB — HM DIABETES EYE EXAM

## 2018-04-14 ENCOUNTER — Encounter: Payer: Self-pay | Admitting: Family Medicine

## 2018-06-04 ENCOUNTER — Other Ambulatory Visit: Payer: Self-pay | Admitting: Family Medicine

## 2018-06-30 ENCOUNTER — Other Ambulatory Visit: Payer: Self-pay

## 2018-06-30 ENCOUNTER — Ambulatory Visit (INDEPENDENT_AMBULATORY_CARE_PROVIDER_SITE_OTHER): Payer: Medicare HMO | Admitting: Family Medicine

## 2018-06-30 ENCOUNTER — Encounter: Payer: Self-pay | Admitting: Family Medicine

## 2018-06-30 VITALS — BP 124/72 | HR 65 | Temp 98.0°F | Ht 66.0 in | Wt 242.0 lb

## 2018-06-30 DIAGNOSIS — N186 End stage renal disease: Secondary | ICD-10-CM

## 2018-06-30 DIAGNOSIS — Z794 Long term (current) use of insulin: Secondary | ICD-10-CM | POA: Diagnosis not present

## 2018-06-30 DIAGNOSIS — I10 Essential (primary) hypertension: Secondary | ICD-10-CM | POA: Diagnosis not present

## 2018-06-30 DIAGNOSIS — E1169 Type 2 diabetes mellitus with other specified complication: Secondary | ICD-10-CM

## 2018-06-30 DIAGNOSIS — Z23 Encounter for immunization: Secondary | ICD-10-CM | POA: Diagnosis not present

## 2018-06-30 LAB — POCT GLYCOSYLATED HEMOGLOBIN (HGB A1C): HBA1C, POC (CONTROLLED DIABETIC RANGE): 7.6 % — AB (ref 0.0–7.0)

## 2018-06-30 MED ORDER — INSULIN REGULAR HUMAN 100 UNIT/ML IJ SOLN: 12.0000 [IU] | Freq: Three times a day (TID) | INTRAMUSCULAR | 2 refills | Status: DC

## 2018-06-30 MED ORDER — INSULIN GLARGINE 100 UNIT/ML ~~LOC~~ SOLN: 15.0000 [IU] | Freq: Every day | SUBCUTANEOUS | 5 refills | Status: DC

## 2018-06-30 MED ORDER — HYDRALAZINE HCL 100 MG PO TABS: 100.0000 mg | ORAL_TABLET | Freq: Three times a day (TID) | ORAL | 3 refills | Status: DC

## 2018-06-30 MED ORDER — CALCITRIOL 0.25 MCG PO CAPS: 0.2500 ug | ORAL_CAPSULE | ORAL | 1 refills | Status: DC

## 2018-06-30 NOTE — Assessment & Plan Note (Signed)
  Refilled cacitriol.

## 2018-06-30 NOTE — Assessment & Plan Note (Signed)
  Chronic, control is improving. Updated insulin dosing to reflect what patient is doing at home. Refilled novolog. Follow up 3 months to repeat a1c.

## 2018-06-30 NOTE — Assessment & Plan Note (Signed)
  Chronic, well controlled, refilled hydralazine today. Follow up 3 months.

## 2018-06-30 NOTE — Patient Instructions (Signed)
  Great to see you today! Continue your current doses of insulin. We'll recheck A1C in 3 months.  If you have questions or concerns please do not hesitate to call at [phone].  Lucila Maine, DO PGY-3, [facility] 06/30/2018 3:29 PM

## 2018-06-30 NOTE — Progress Notes (Signed)
    Subjective:    Patient ID: [name], female    DOB: 1956/04/06, 62 y.o.   MRN: [id number]   CC: follow up DM   DM- She is pleasantly surprised by A1C today. Checking CBGs several times a day, sugars usually between 120-130. Her highest 200's after eating dessert, lowest was 60 but she felt ok. Feels bad if is in 50's. No polydipsia, polyuria, polyphagia. She has self titrated up her insulin. She takes fast acting 12-15 units based on sugars. She takes 15 units lantus every morning.   HTN- BP 124/72 today, was high at home in the past few weeks, cut out salted nuts with improvement. She is taking 100 TID of hydralazine and Coreg 12.5 BID. She denies chest pain, SOB, vision changes, headache  Smoking status reviewed- former smoker  Review of Systems- see HPI   Objective:  BP 124/72   Pulse 65   Temp 98 F (36.7 C)   Ht 5\' 6"  (1.676 m)   Wt 242 lb (109.8 kg)   SpO2 97%   BMI 39.06 kg/m  Vitals and nursing note reviewed  General: well appearing, well nourished, in no acute distress HEENT: normocephalic, MMM Cardiac: RRR, clear S1 and S2, no murmurs, rubs, or gallops Respiratory: clear to auscultation bilaterally, no increased work of breathing Extremities: no edema or cyanosis. Neuro: alert and oriented, no focal deficits   Assessment & Plan:    Hypertension  Chronic, well controlled, refilled hydralazine today. Follow up 3 months.  Diabetes mellitus  Chronic, control is improving. Updated insulin dosing to reflect what patient is doing at home. Refilled novolog. Follow up 3 months to repeat a1c.   ESRD (end stage renal disease) ([place])  Refilled cacitriol.    Flu shot and pneumovax given today.    Return in about 3 months (around 09/30/2018) for DM.   Lucila Maine, DO Family Medicine Resident PGY-3

## 2018-07-09 DIAGNOSIS — E1122 Type 2 diabetes mellitus with diabetic chronic kidney disease: Secondary | ICD-10-CM | POA: Diagnosis not present

## 2018-07-09 DIAGNOSIS — N184 Chronic kidney disease, stage 4 (severe): Secondary | ICD-10-CM | POA: Diagnosis not present

## 2018-07-09 DIAGNOSIS — D631 Anemia in chronic kidney disease: Secondary | ICD-10-CM | POA: Diagnosis not present

## 2018-07-09 DIAGNOSIS — I129 Hypertensive chronic kidney disease with stage 1 through stage 4 chronic kidney disease, or unspecified chronic kidney disease: Secondary | ICD-10-CM | POA: Diagnosis not present

## 2018-07-09 DIAGNOSIS — N2581 Secondary hyperparathyroidism of renal origin: Secondary | ICD-10-CM | POA: Diagnosis not present

## 2018-07-09 DIAGNOSIS — Z6835 Body mass index (BMI) 35.0-35.9, adult: Secondary | ICD-10-CM | POA: Diagnosis not present

## 2018-07-24 ENCOUNTER — Ambulatory Visit: Payer: Medicare HMO

## 2018-08-06 ENCOUNTER — Telehealth: Payer: Self-pay

## 2018-08-06 NOTE — Telephone Encounter (Signed)
Patient left message asking for PCP to call her. Wants her to order some labs at [place] for her.  Call back is [phone]  Danley Danker, RN [facility])

## 2018-08-11 NOTE — Telephone Encounter (Signed)
Happy to do this, can we call her to find out what labs she needs

## 2018-08-11 NOTE — Telephone Encounter (Signed)
Patient states that she has been having abdominal pain and dark stools. She has a family history of colon cancer and is concerned that she will need "cancer detecting" labs.  Patient informed that this is best discussed at an office visit and to make sure that we are treating her symptoms properly.  Appt made. Jazmin Hartsell,CMA

## 2018-08-15 ENCOUNTER — Ambulatory Visit: Payer: Medicare HMO | Admitting: Family Medicine

## 2018-08-18 ENCOUNTER — Other Ambulatory Visit: Payer: Self-pay | Admitting: Family Medicine

## 2018-10-14 ENCOUNTER — Other Ambulatory Visit: Payer: Self-pay | Admitting: Family Medicine

## 2018-10-29 ENCOUNTER — Ambulatory Visit: Payer: Medicare HMO | Admitting: Family Medicine

## 2018-11-04 DIAGNOSIS — N184 Chronic kidney disease, stage 4 (severe): Secondary | ICD-10-CM | POA: Diagnosis not present

## 2018-11-04 DIAGNOSIS — E1122 Type 2 diabetes mellitus with diabetic chronic kidney disease: Secondary | ICD-10-CM | POA: Diagnosis not present

## 2018-11-04 DIAGNOSIS — D631 Anemia in chronic kidney disease: Secondary | ICD-10-CM | POA: Diagnosis not present

## 2018-11-04 DIAGNOSIS — R6889 Other general symptoms and signs: Secondary | ICD-10-CM | POA: Diagnosis not present

## 2018-11-04 DIAGNOSIS — Z6835 Body mass index (BMI) 35.0-35.9, adult: Secondary | ICD-10-CM | POA: Diagnosis not present

## 2018-11-04 DIAGNOSIS — N2581 Secondary hyperparathyroidism of renal origin: Secondary | ICD-10-CM | POA: Diagnosis not present

## 2018-11-04 DIAGNOSIS — I129 Hypertensive chronic kidney disease with stage 1 through stage 4 chronic kidney disease, or unspecified chronic kidney disease: Secondary | ICD-10-CM | POA: Diagnosis not present

## 2018-11-05 ENCOUNTER — Other Ambulatory Visit: Payer: Self-pay | Admitting: Family Medicine

## 2018-11-05 ENCOUNTER — Ambulatory Visit: Payer: Medicare HMO | Admitting: Family Medicine

## 2018-11-11 ENCOUNTER — Emergency Department (HOSPITAL_COMMUNITY): Payer: Medicare HMO

## 2018-11-11 ENCOUNTER — Other Ambulatory Visit: Payer: Self-pay

## 2018-11-11 ENCOUNTER — Emergency Department (HOSPITAL_COMMUNITY)
Admission: EM | Admit: 2018-11-11 | Discharge: 2018-11-11 | Disposition: A | Discharge status: Home / Self Care | Payer: Medicare HMO | Attending: Emergency Medicine | Admitting: Emergency Medicine

## 2018-11-11 ENCOUNTER — Encounter (HOSPITAL_COMMUNITY): Payer: Self-pay | Admitting: Emergency Medicine

## 2018-11-11 DIAGNOSIS — I2510 Atherosclerotic heart disease of native coronary artery without angina pectoris: Secondary | ICD-10-CM | POA: Insufficient documentation

## 2018-11-11 DIAGNOSIS — E1122 Type 2 diabetes mellitus with diabetic chronic kidney disease: Secondary | ICD-10-CM | POA: Insufficient documentation

## 2018-11-11 DIAGNOSIS — I120 Hypertensive chronic kidney disease with stage 5 chronic kidney disease or end stage renal disease: Secondary | ICD-10-CM | POA: Insufficient documentation

## 2018-11-11 DIAGNOSIS — R110 Nausea: Secondary | ICD-10-CM

## 2018-11-11 DIAGNOSIS — R0602 Shortness of breath: Secondary | ICD-10-CM | POA: Insufficient documentation

## 2018-11-11 DIAGNOSIS — Z794 Long term (current) use of insulin: Secondary | ICD-10-CM | POA: Diagnosis not present

## 2018-11-11 DIAGNOSIS — R1111 Vomiting without nausea: Secondary | ICD-10-CM | POA: Diagnosis not present

## 2018-11-11 DIAGNOSIS — I10 Essential (primary) hypertension: Secondary | ICD-10-CM | POA: Diagnosis not present

## 2018-11-11 DIAGNOSIS — E11319 Type 2 diabetes mellitus with unspecified diabetic retinopathy without macular edema: Secondary | ICD-10-CM | POA: Insufficient documentation

## 2018-11-11 DIAGNOSIS — Z79899 Other long term (current) drug therapy: Secondary | ICD-10-CM | POA: Diagnosis not present

## 2018-11-11 DIAGNOSIS — E1140 Type 2 diabetes mellitus with diabetic neuropathy, unspecified: Secondary | ICD-10-CM | POA: Diagnosis not present

## 2018-11-11 DIAGNOSIS — Z91010 Allergy to peanuts: Secondary | ICD-10-CM | POA: Diagnosis not present

## 2018-11-11 DIAGNOSIS — Z87891 Personal history of nicotine dependence: Secondary | ICD-10-CM | POA: Insufficient documentation

## 2018-11-11 DIAGNOSIS — R079 Chest pain, unspecified: Secondary | ICD-10-CM | POA: Diagnosis not present

## 2018-11-11 DIAGNOSIS — R0989 Other specified symptoms and signs involving the circulatory and respiratory systems: Secondary | ICD-10-CM | POA: Diagnosis not present

## 2018-11-11 DIAGNOSIS — R42 Dizziness and giddiness: Secondary | ICD-10-CM | POA: Diagnosis not present

## 2018-11-11 DIAGNOSIS — N186 End stage renal disease: Secondary | ICD-10-CM | POA: Insufficient documentation

## 2018-11-11 LAB — CBC WITH DIFFERENTIAL/PLATELET
Abs Immature Granulocytes: 0.04 10*3/uL (ref 0.00–0.07)
Basophils Absolute: 0.1 10*3/uL (ref 0.0–0.1)
Basophils Relative: 1 %
Eosinophils Absolute: 0 10*3/uL (ref 0.0–0.5)
Eosinophils Relative: 0 %
HEMATOCRIT: 32 % — AB (ref 36.0–46.0)
Hemoglobin: 9.8 g/dL — ABNORMAL LOW (ref 12.0–15.0)
Immature Granulocytes: 1 %
Lymphocytes Relative: 13 %
Lymphs Abs: 1.1 10*3/uL (ref 0.7–4.0)
MCH: 25.1 pg — ABNORMAL LOW (ref 26.0–34.0)
MCHC: 30.6 g/dL (ref 30.0–36.0)
MCV: 81.8 fL (ref 80.0–100.0)
Monocytes Absolute: 0.6 10*3/uL (ref 0.1–1.0)
Monocytes Relative: 7 %
NRBC: 0 % (ref 0.0–0.2)
Neutro Abs: 6.8 10*3/uL (ref 1.7–7.7)
Neutrophils Relative %: 78 %
Platelets: 339 10*3/uL (ref 150–400)
RBC: 3.91 MIL/uL (ref 3.87–5.11)
RDW: 15.3 % (ref 11.5–15.5)
WBC: 8.7 10*3/uL (ref 4.0–10.5)

## 2018-11-11 LAB — COMPREHENSIVE METABOLIC PANEL
ALT: 15 U/L (ref 0–44)
AST: 20 U/L (ref 15–41)
Albumin: 3.6 g/dL (ref 3.5–5.0)
Alkaline Phosphatase: 77 U/L (ref 38–126)
Anion gap: 12 (ref 5–15)
BUN: 56 mg/dL — ABNORMAL HIGH (ref 8–23)
CHLORIDE: 100 mmol/L (ref 98–111)
CO2: 23 mmol/L (ref 22–32)
Calcium: 8.8 mg/dL — ABNORMAL LOW (ref 8.9–10.3)
Creatinine, Ser: 3.07 mg/dL — ABNORMAL HIGH (ref 0.44–1.00)
GFR calc Af Amer: 18 mL/min — ABNORMAL LOW (ref >60)
GFR, EST NON AFRICAN AMERICAN: 15 mL/min — AB (ref >60)
Glucose, Bld: 134 mg/dL — ABNORMAL HIGH (ref 70–99)
Potassium: 4.4 mmol/L (ref 3.5–5.1)
Sodium: 135 mmol/L (ref 135–145)
Total Bilirubin: 0.8 mg/dL (ref 0.3–1.2)
Total Protein: 7.1 g/dL (ref 6.5–8.1)

## 2018-11-11 LAB — BRAIN NATRIURETIC PEPTIDE: B Natriuretic Peptide: 26.8 pg/mL (ref 0.0–100.0)

## 2018-11-11 LAB — D-DIMER, QUANTITATIVE: D-Dimer, Quant: 0.32 ug/mL-FEU (ref 0.00–0.50)

## 2018-11-11 LAB — LIPASE, BLOOD: Lipase: 29 U/L (ref 11–51)

## 2018-11-11 LAB — I-STAT TROPONIN, ED: Troponin i, poc: 0 ng/mL (ref 0.00–0.08)

## 2018-11-11 MED ORDER — SODIUM CHLORIDE 0.9 % IV BOLUS
500.0000 mL | Freq: Once | INTRAVENOUS | Status: AC
  Administered 2018-11-11: 500 mL via INTRAVENOUS

## 2018-11-11 MED ORDER — ONDANSETRON HCL 4 MG/2ML IJ SOLN
4.0000 mg | Freq: Once | INTRAMUSCULAR | Status: AC
  Administered 2018-11-11: 4 mg via INTRAVENOUS
  Filled 2018-11-11: qty 2

## 2018-11-11 MED ORDER — PROCHLORPERAZINE EDISYLATE 10 MG/2ML IJ SOLN
10.0000 mg | Freq: Once | INTRAMUSCULAR | Status: AC
  Administered 2018-11-11: 10 mg via INTRAVENOUS
  Filled 2018-11-11: qty 2

## 2018-11-11 NOTE — Discharge Instructions (Addendum)
Please follow up with your primary care as needed, all your laboratory results were within normal limits today. Return to the ED if you experience any chest pain, shortness of breath or worsening symptoms.

## 2018-11-11 NOTE — ED Notes (Signed)
Paged phlebotomy to room for straight stick labs

## 2018-11-11 NOTE — ED Triage Notes (Signed)
Pt arrives via EMS from home woke up at 1130 this morning feeling fast heart rate. Has hx of a fib. EKG rate 60s, afib. Pt c/o mainly of nausea at present. Denies pain. cbg 148, 22g Left hand, 163/90. Alert, oriented x4.

## 2018-11-11 NOTE — ED Provider Notes (Addendum)
[place] EMERGENCY DEPARTMENT Provider Note   CSN: [id number] Arrival date & time: 11/11/18  1246    History   Chief Complaint Chief Complaint  Patient presents with  . Nausea    HPI [name] is a 63 y.o. female.     63 y.o female with a PMH of Anemia, Afib, HTN, CKD presents to the ED with a chief complaint of nausea and dizziness since this morning. Patient reports waking up this morning and feeling nauseated and dizzy, reports "feeling like I am going to pass out". She reports feeling some chest pain and some shortness of breath states "this is here always". She denies any fever, or abdominal pain. Patient is very vague about her symptoms. Patient is currently on Eliquis for her Afib.      Past Medical History:  Diagnosis Date  . Anemia   . Atrial fibrillation ([place])   . CAD (coronary artery disease)   . Chronic kidney disease, stage III (moderate) (HCC)   . Constipation   . Diabetes mellitus ([place])   . History of blood transfusion   . Hypertension   . Myocardial infarction [facility])    in April 2014    Patient Active Problem List   Diagnosis Date Noted  . Grief at loss of child 06/05/2017  . Lymphedema 06/20/2015  . Back pain 11/25/2014  . Thyroid enlargement 07/19/2014  . Health care maintenance 11/11/2013  . Vitamin D deficiency 09/03/2013  . Chronic cough 05/26/2013  . Hyperlipidemia 04/24/2013  . Diabetic retinopathy ([place]) 01/23/2013  . Chronic atrial fibrillation ([place]) 01/15/2013  . CAD (coronary artery disease) 01/11/2013  . H/O non-ST elevation myocardial infarction (NSTEMI) 01/09/2013  . History of stroke 01/08/2013  . Diabetic neuropathy 01/08/2013  . Hypertension   . Diabetes mellitus   . ESRD (end stage renal disease) [facility])     Past Surgical History:  Procedure Laterality Date  . ANKLE SURGERY Right    x 6 total  . HARDWARE REMOVAL Right 02/13/2013   Procedure: HARDWARE REMOVAL;  Surgeon: Newt Minion, MD;   Location: [place];  Service: Orthopedics;  Laterality: Right;  Right Ankle Removal Hardware, Debridement, Place Wound VAC  . laser surgery for diabetic retinopathy Bilateral   . LEFT HEART CATHETERIZATION WITH CORONARY ANGIOGRAM N/A 01/09/2013   Procedure: LEFT HEART CATHETERIZATION WITH CORONARY ANGIOGRAM;  Surgeon: Jolaine Artist, MD;  Location[facility] C[facility] LAB;  Service: Cardiovascular;  Laterality: N/A;  . ORIF ANKLE FRACTURE Right 01/12/2013   Procedure: OPEN REDUCTION INTERNAL FIXATION (ORIF) ANKLE FRACTURE;  Surgeon: Newt Minion, MD;  Location: [place];  Service: Orthopedics;  Laterality: Right;  . PARTIAL HYSTERECTOMY       OB History   No obstetric history on file.      Home Medications    Prior to Admission medications   Medication Sig Start Date End Date Taking? Authorizing Provider  apixaban (ELIQUIS) 2.5 MG TABS tablet Take 1 tablet (2.5 mg total) by mouth 2 (two) times daily. 03/21/18  Yes Steve Rattler, DO  Ascorbic Acid (VITAMIN C PO) Take 1 tablet by mouth daily.   Yes [provider]  atorvastatin (LIPITOR) 80 MG tablet TAKE 1 TABLET AT BEDTIME Patient taking differently: Take 80 mg by mouth at bedtime.  11/06/18  Yes Lucila Maine C, DO  calcitRIOL (ROCALTROL) 0.25 MCG capsule Take 1 capsule (0.25 mcg total) by mouth every other day. 06/30/18  Yes Riccio, Angela C, DO  carvedilol (COREG) 12.5  MG tablet TAKE 1 TABLET TWICE DAILY WITH FOOD Patient taking differently: Take 12.5 mg by mouth 2 (two) times daily with a meal.  10/24/17  Yes Riccio, Angela C, DO  co-enzyme Q-10 30 MG capsule Take 30 mg by mouth daily.   Yes [provider]  furosemide (LASIX) 40 MG tablet Take 80 mg by mouth 2 (two) times daily. 08/12/18  Yes [provider]  hydrALAZINE (APRESOLINE) 50 MG tablet Take 50-100 mg by mouth See admin instructions. Takes 152m in the morning, 550mmidday and 10012mn the evening. 09/29/18  Yes [provider]  insulin glargine (LANTUS)  100 UNIT/ML injection Inject 0.15 mLs (15 Units total) into the skin daily. Patient taking differently: Inject 25 Units into the skin daily.  06/30/18  Yes Riccio, Angela C, DO  insulin regular (NOVOLIN R) 100 units/mL injection Inject 0.12-0.15 mLs (12-15 Units total) into the skin 3 (three) times daily before meals. Patient taking differently: Inject 10-15 Units into the skin 3 (three) times daily before meals.  06/30/18  Yes Riccio, Angela C, DO  isosorbide mononitrate (IMDUR) 30 MG 24 hr tablet TAKE 1 TABLET EVERY DAY Patient taking differently: Take 30 mg by mouth daily.  10/15/18  Yes RicSteve RattlerO  Multiple Vitamin (MULTIVITAMIN WITH MINERALS) TABS Take 1 tablet by mouth daily.   Yes [provider]  Pyridoxine HCl (VITAMIN B6 PO) Take 1 tablet by mouth daily.   Yes [provider]  VITAMIN D, CHOLECALCIFEROL, PO Take 1 tablet by mouth daily.   Yes [provider]  ACCU-CHEK AVIVA PLUS test strip CHECK BLOOD GLUCOSE THREE TIMES DAILY 06/05/18   RicLucila Maine DO  Blood Glucose Monitoring Suppl (ACCU-CHEK AVIVA PLUS) w/Device KIT CHECK BLOOD GLUCOSE THREE TIMES DAILY 10/24/17   RicLucila Maine DO  DROPLET INSULIN SYRINGE 31G X 5/16" 0.5 ML MISC USE FOUR TIMES DAILY  FOR  INJECTIONS 08/19/18   RicLucila Maine DO  hydrALAZINE (APRESOLINE) 100 MG tablet Take 1 tablet (100 mg total) by mouth 3 (three) times daily. 06/30/18   RicSteve RattlerO  Insulin Syringes, Disposable, U-100 0.5 ML MISC 4x daily injections 03/21/18   RicSteve RattlerO  Lancet Devices (ACCU-CHEK SOFBrandywine Valley Endoscopy Centerancets Fill for 1 month for TID testing. Use as instructed 11/13/13   TheHilton SinclairD  NEEDLE, DISP, 30 G (BD DISP NEEDLES) 30G X 1/2" MISC For 4x daily injections 11/05/15   PheKatheren ShamsO    Family History Family History  Problem Relation Age of Onset  . Hypertension Father        Also had CAD  . Diabetes Mother        No history CAD  . Colon cancer Mother 60 11     [place] 66 [place] Cervical [place] Sister     Social History Social History   Tobacco Use  . Smoking status: Former Smoker    Last attempt to quit: 08/27/1976    Years since quitting: 42.2  . Smokeless tobacco: Never Used  Substance Use Topics  . Alcohol use: No  . Drug use: No     Allergies   Lisinopril and Peanut-containing drug products   Review of Systems Review of Systems  Constitutional: Negative for chills and fever.  HENT: Negative for ear pain and sore throat.   Eyes: Negative for pain and visual disturbance.  Respiratory: Positive for shortness of breath. Negative for cough.  Cardiovascular: Positive for chest pain. Negative for palpitations.  Gastrointestinal: Negative for abdominal pain and vomiting.  Genitourinary: Negative for dysuria and hematuria.  Musculoskeletal: Negative for arthralgias and back pain.  Skin: Negative for color change and rash.  Neurological: Positive for dizziness and light-headedness. Negative for seizures, syncope and weakness.  All other systems reviewed and are negative.    Physical Exam Updated Vital Signs BP (!) 173/67   Pulse 63   Temp 98.2 F (36.8 C) (Oral)   Resp 17   Ht 5' 7"  (1.702 m)   Wt 112.5 kg   SpO2 95%   BMI 38.84 kg/m   Physical Exam Vitals signs and nursing note reviewed.  Constitutional:      General: She is not in acute distress.    Appearance: She is well-developed.  HENT:     Head: Normocephalic and atraumatic.     Mouth/Throat:     Pharynx: No oropharyngeal exudate.  Eyes:     Pupils: Pupils are equal, round, and reactive to light.  Neck:     Musculoskeletal: Normal range of motion.  Cardiovascular:     Rate and Rhythm: Regular rhythm.     Heart sounds: Normal heart sounds.  Pulmonary:     Effort: Pulmonary effort is normal. No respiratory distress.     Breath sounds: Normal breath sounds.  Abdominal:     General: Bowel sounds are normal. There is no distension.     Palpations:  Abdomen is soft.     Tenderness: There is no abdominal tenderness.  Musculoskeletal:        General: No tenderness or deformity.     Right lower leg: No edema.     Left lower leg: No edema.  Skin:    General: Skin is warm and dry.  Neurological:     Mental Status: She is alert and oriented to person, place, and time.     Comments: Alert, oriented, thought content appropriate. Speech fluent without evidence of aphasia. Able to follow 2 step commands without difficulty.  Cranial Nerves:  II:  Peripheral visual fields grossly normal, pupils, round, reactive to light III,IV, VI: ptosis not present, extra-ocular motions intact bilaterally  V,VII: smile symmetric, facial light touch sensation equal VIII: hearing grossly normal bilaterally  IX,X: midline uvula rise  XI: bilateral shoulder shrug equal and strong XII: midline tongue extension  Motor:  5/5 in upper and lower extremities bilaterally including strong and equal grip strength and dorsiflexion/plantar flexion Sensory: light touch normal in all extremities.  Cerebellar: normal finger-to-nose with bilateral upper extremities, pronator drift negative        ED Treatments / Results  Labs (all labs ordered are listed, but only abnormal results are displayed) Labs Reviewed  CBC WITH DIFFERENTIAL/PLATELET - Abnormal; Notable for the following components:      Result Value   Hemoglobin 9.8 (*)    HCT 32.0 (*)    MCH 25.1 (*)    All other components within normal limits  COMPREHENSIVE METABOLIC PANEL - Abnormal; Notable for the following components:   Glucose, Bld 134 (*)    BUN 56 (*)    Creatinine, Ser 3.07 (*)    Calcium 8.8 (*)    GFR calc non Af Amer 15 (*)    GFR calc Af Amer 18 (*)    All other components within normal limits  LIPASE, BLOOD  BRAIN NATRIURETIC PEPTIDE  D-DIMER, QUANTITATIVE (NOT AT [facility])  TROPONIN I  I-STAT TROPONIN, ED  EKG EKG Interpretation  Date/Time:  Tuesday November 11 2018 12:54:17  EST Ventricular Rate:  62 PR Interval:    QRS Duration: 102 QT Interval:  437 QTC Calculation: 444 R Axis:   13 Text Interpretation:  Sinus rhythm No significant change since last tracing Confirmed by Deno Etienne [phone]) on 11/11/2018 2:07:28 PM   Radiology Dg Chest 2 View  Result Date: 11/11/2018 CLINICAL DATA:  Dizziness 1 day short of breath EXAM: CHEST - 2 VIEW COMPARISON:  02/13/2013 FINDINGS: Cardiac enlargement. Mild vascular congestion without edema or effusion. Negative for pneumonia IMPRESSION: Cardiac enlargement with mild vascular congestion. Negative for edema. Electronically Signed   By: Franchot Gallo M.D.   On: 11/11/2018 14:57    Procedures Procedures (including critical care time)  Medications Ordered in ED Medications  ondansetron (ZOFRAN) injection 4 mg (4 mg Intravenous Given 11/11/18 1419)  sodium chloride 0.9 % bolus 500 mL (0 mLs Intravenous Stopped 11/11/18 1622)  prochlorperazine (COMPAZINE) injection 10 mg (10 mg Intravenous Given 11/11/18 1804)     Initial Impression / Assessment and Plan / ED Course  I have reviewed the triage vital signs and the nursing notes.  Pertinent labs & imaging results that were available during my care of the patient were reviewed by me and considered in my medical decision making (see chart for details).      Patient with nausea and dizziness since waking up this morning around 11 AM.  Reports dizziness is worse while sitting up this morning. During evaluation patient is non toxix appearing, lungs are clear to auscultation. Denies any recent sick contacts.  CMP showed a creatine of 3.07 worsening than previous visit, BUN is 56. CBC showed no leukocytosis, hemoglobin is 9.8 improved from previous visit of a year ago. Lipase was within normal limits. BNP was normal.  DG Chest showed: Cardiac enlargement with mild vascular congestion. Negative for  edema.     EKG showed no changes with infarct or STEMI. Patient received 554m  bolus along with zofran for her nausea. Will attempt PO challenge on patient. D dimer was negative, no tachycardia or hypoxia. Troponin was negative low suspicion for ACS denies any chest pain, SOB.   6:26 PM Patient is tolerating PO has eaten crackers and drank water reports she feels better.She denies any dizziness, chest pain, shortness of breath at this time. Patient will be discharge home to follow up with PCP. Strict return precautions provided.    Final Clinical Impressions(s) / ED Diagnoses   Final diagnoses:  Nausea    ED Discharge Orders    None       SJaneece Fitting PA-C 11/11/18 1828    SJaneece Fitting PA-C 11/11/18 2009    [address] [place]/19/20 [phone]

## 2018-11-14 ENCOUNTER — Ambulatory Visit: Payer: Medicare HMO | Admitting: Family Medicine

## 2018-11-28 ENCOUNTER — Other Ambulatory Visit: Payer: Self-pay | Admitting: Family Medicine

## 2019-01-01 ENCOUNTER — Ambulatory Visit: Payer: Medicare HMO | Admitting: Family Medicine

## 2019-02-14 ENCOUNTER — Other Ambulatory Visit: Payer: Self-pay | Admitting: Family Medicine

## 2019-04-14 DIAGNOSIS — N184 Chronic kidney disease, stage 4 (severe): Secondary | ICD-10-CM | POA: Diagnosis not present

## 2019-04-14 DIAGNOSIS — N2581 Secondary hyperparathyroidism of renal origin: Secondary | ICD-10-CM | POA: Diagnosis not present

## 2019-04-14 DIAGNOSIS — I129 Hypertensive chronic kidney disease with stage 1 through stage 4 chronic kidney disease, or unspecified chronic kidney disease: Secondary | ICD-10-CM | POA: Diagnosis not present

## 2019-04-14 DIAGNOSIS — Z6835 Body mass index (BMI) 35.0-35.9, adult: Secondary | ICD-10-CM | POA: Diagnosis not present

## 2019-04-14 DIAGNOSIS — E1122 Type 2 diabetes mellitus with diabetic chronic kidney disease: Secondary | ICD-10-CM | POA: Diagnosis not present

## 2019-04-14 DIAGNOSIS — D631 Anemia in chronic kidney disease: Secondary | ICD-10-CM | POA: Diagnosis not present

## 2019-05-04 ENCOUNTER — Other Ambulatory Visit: Payer: Self-pay | Admitting: *Deleted

## 2019-05-04 MED ORDER — INSULIN REGULAR HUMAN 100 UNIT/ML IJ SOLN: 12.0000 [IU] | Freq: Three times a day (TID) | INTRAMUSCULAR | 2 refills | Status: DC

## 2019-05-11 ENCOUNTER — Encounter: Payer: Self-pay | Admitting: Family Medicine

## 2019-05-11 ENCOUNTER — Other Ambulatory Visit: Payer: Self-pay

## 2019-05-11 ENCOUNTER — Ambulatory Visit (INDEPENDENT_AMBULATORY_CARE_PROVIDER_SITE_OTHER): Payer: Medicare HMO | Admitting: Family Medicine

## 2019-05-11 VITALS — BP 160/68 | HR 66 | Ht 66.0 in | Wt 247.2 lb

## 2019-05-11 DIAGNOSIS — E1122 Type 2 diabetes mellitus with diabetic chronic kidney disease: Secondary | ICD-10-CM | POA: Diagnosis not present

## 2019-05-11 DIAGNOSIS — E1169 Type 2 diabetes mellitus with other specified complication: Secondary | ICD-10-CM

## 2019-05-11 DIAGNOSIS — Z794 Long term (current) use of insulin: Secondary | ICD-10-CM

## 2019-05-11 DIAGNOSIS — I358 Other nonrheumatic aortic valve disorders: Secondary | ICD-10-CM

## 2019-05-11 DIAGNOSIS — N184 Chronic kidney disease, stage 4 (severe): Secondary | ICD-10-CM

## 2019-05-11 LAB — POCT GLYCOSYLATED HEMOGLOBIN (HGB A1C): HbA1c, POC (controlled diabetic range): 7.4 % — AB (ref 0.0–7.0)

## 2019-05-11 NOTE — Progress Notes (Signed)
Subjective:  Patient ID: [name]  DOB: 07-31-1956 MRN: [id number]  [name] is a 63 y.o. female with a PMH of HTN, CAD, atrial fibrillation, T2DM, CKD stage IV, anemia of chronic disease, h/o CVA, here today for f/u diabetes.  HPI:  CKD stage IV -Patient followed with [facility] last seen on 04/14/2019.   -Labs performed at that time significant for creatinine 3.32, GFR 16, BUN 18, sodium 138, potassium 4.4, albumin 4.0, phosphorus 3.8, hemoglobin 10.0, iron 49 -Patient was instructed to continue with hydralazine 100 mg 3 times daily, continue her calcitriol  Diabetes: Disease Monitoring: - Blood Sugar ranges- 190-200; couple of days ago CBG's dropped to 40's  - Medications: novolin 10-15units, lantus 20-25units, Compliant: yes Metformin gives her headaches - On eliquis on statin - Last eye exam: had appointment in April, cancelled due to covid - Last foot exam: will perform  ROS: denies hypoglycemic sx, dizziness, diaphoresis, LOC, polyuria, polydipsia  Monitoring Labs and Parameters - Last A1C:  Lab Results  Component Value Date   HGBA1C 7.4 (A) 05/11/2019   Hypertension: - Medications: hydralazine TID, carvedilol  - Compliance: yes - Checking BP at home: no - Denies any SOB, CP, vision changes, LE edema, medication SEs, or symptoms of hypotension  ROS: as mentioned in HPI  Family hx: Brother with CKD, diabetes; mother died of MI and diabetes  Social hx: Denies use of illicit drugs, alcohol use Smoking status reviewed  Patient Active Problem List   Diagnosis Date Noted  . Aortic heart murmur 05/15/2019  . Thyroid enlargement 07/19/2014  . Vitamin D deficiency 09/03/2013  . Hyperlipidemia 04/24/2013  . Diabetic retinopathy ([place]) 01/23/2013  . Chronic atrial fibrillation ([place]) 01/15/2013  . CAD (coronary artery disease) 01/11/2013  . H/O non-ST elevation myocardial infarction (NSTEMI) 01/09/2013  . History of stroke 01/08/2013  . Diabetic  neuropathy 01/08/2013  . Hypertension   . Diabetes mellitus   . Chronic kidney disease (CKD), stage IV (severe) (HCC)      Objective:  BP (!) 160/68   Pulse 66   Ht 5\' 6"  (1.676 m)   Wt 247 lb 4 oz (112.2 kg)   SpO2 95%   BMI 39.91 kg/m   Vitals and nursing note reviewed  General: NAD, pleasant Cardiac: RRR, normal heart sounds, 3/6 systolic murmur over aortic post Pulm: normal effort, CTAB Extremities: no edema or cyanosis. WWP. Skin: warm and dry, no rashes noted Neuro: alert and oriented, no focal deficits Psych: normal affect, normal thought content  Assessment & Plan:   Chronic kidney disease (CKD), stage IV (severe) ([place]) Patient to continue following with [facility].  Advised of her recent results.  Encouraged to continue her calcitriol, Coreg and hydralazine which will help with her blood pressure control.  Diabetes mellitus A1c 7.4 today.  Patient does report that she has had multiple CBG drops to the 40s.  She states that she is on Novolin 10 to 15 units with Lantus 20 to 25 units.  Given that she has multiple drops to the 40s recommended that she decrease her Novolin and use between 8-10.  She was advised that when she would normally put 10 she is only supposed to give herself 8 units.  Teach back method used. -Patient also interested in obtaining a freestyle libre for her blood glucose monitoring so that she does not have to continue to stick herself.  Will attempt to call into her mail order pharmacy and then call into a local pharmacy  Aortic heart murmur Patient reports that she has never been told that she has a murmur and noted 3/6 aortic murmur on exam today.  Will obtain echocardiogram.    [place] Kallie Depolo, DO Family Medicine Resident PGY-3

## 2019-05-11 NOTE — Patient Instructions (Addendum)
Thank you for coming to see me today. It was a pleasure meeting you! Today we talked about:   Diabetes. Your A1c is 7.4.  I will send the freestyle libre to your pharmacy.  I also recommend that you decrease the amount of Novolin that you are using from 10 to 15 units and start using 8 to 10 units.  Our goal is to keep your blood sugars between 80 and 110, and to avoid any lows in the 40s.  I have ordered you an echocardiogram.  Please go to this as scheduled.  This is ordered to evaluate the new murmur I heard on your exam.  Continue your current blood pressure medications.  If you experience any lightheadedness, shortness of breath or chest pain like we discussed in the office and do not hesitate to go to the emergency room or call the office during regular hours.  Please follow-up with me in 3 months or sooner as needed.  If you have any questions or concerns, please do not hesitate to call the office at [phone].  Take Care,   [place] Emori Mumme, DO

## 2019-05-11 NOTE — Progress Notes (Signed)
new

## 2019-05-12 ENCOUNTER — Other Ambulatory Visit: Payer: Self-pay

## 2019-05-12 MED ORDER — ISOSORBIDE MONONITRATE ER 30 MG PO TB24: 30.0000 mg | ORAL_TABLET | Freq: Every day | ORAL | 1 refills | Status: DC

## 2019-05-13 ENCOUNTER — Ambulatory Visit (HOSPITAL_COMMUNITY): Admission: RE | Admit: 2019-05-13 | Payer: Medicare HMO | Source: Ambulatory Visit

## 2019-05-15 DIAGNOSIS — I358 Other nonrheumatic aortic valve disorders: Secondary | ICD-10-CM | POA: Insufficient documentation

## 2019-05-15 NOTE — Assessment & Plan Note (Signed)
Patient to continue following with [facility].  Advised of her recent results.  Encouraged to continue her calcitriol, Coreg and hydralazine which will help with her blood pressure control.

## 2019-05-15 NOTE — Assessment & Plan Note (Signed)
Patient reports that she has never been told that she has a murmur and noted 3/6 aortic murmur on exam today.  Will obtain echocardiogram.

## 2019-05-15 NOTE — Assessment & Plan Note (Signed)
A1c 7.4 today.  Patient does report that she has had multiple CBG drops to the 40s.  She states that she is on Novolin 10 to 15 units with Lantus 20 to 25 units.  Given that she has multiple drops to the 40s recommended that she decrease her Novolin and use between 8-10.  She was advised that when she would normally put 10 she is only supposed to give herself 8 units.  Teach back method used. -Patient also interested in obtaining a freestyle libre for her blood glucose monitoring so that she does not have to continue to stick herself.  Will attempt to call into her mail order pharmacy and then call into a local pharmacy

## 2019-05-26 ENCOUNTER — Other Ambulatory Visit: Payer: Self-pay

## 2019-05-26 MED ORDER — CALCITRIOL 0.25 MCG PO CAPS: 0.2500 ug | ORAL_CAPSULE | ORAL | 1 refills | Status: DC

## 2019-05-26 MED ORDER — ATORVASTATIN CALCIUM 80 MG PO TABS: 80.0000 mg | ORAL_TABLET | Freq: Every day | ORAL | 2 refills | Status: DC

## 2019-06-09 ENCOUNTER — Other Ambulatory Visit: Payer: Self-pay | Admitting: Family Medicine

## 2019-06-09 ENCOUNTER — Telehealth: Payer: Self-pay | Admitting: *Deleted

## 2019-06-09 DIAGNOSIS — Z1231 Encounter for screening mammogram for malignant neoplasm of breast: Secondary | ICD-10-CM

## 2019-06-09 MED ORDER — FREESTYLE LIBRE READER DEVI: 1.0000 | Freq: Once | 0 refills | Status: AC

## 2019-06-09 MED ORDER — FREESTYLE LIBRE 14 DAY SENSOR MISC: 1.0000 | Freq: Every day | 2 refills | Status: DC

## 2019-06-09 NOTE — Telephone Encounter (Signed)
Pt calling to check status of freestyle libre meter. Christen Bame, CMA

## 2019-06-09 NOTE — Telephone Encounter (Signed)
I have sent this in to her [facility] pharmacy, so she may pick it up at her earliest convenience, please let me know if there is anything further to do.   Connie Torri Michalski, DO PGY-3, [facility]

## 2019-06-19 DIAGNOSIS — E113513 Type 2 diabetes mellitus with proliferative diabetic retinopathy with macular edema, bilateral: Secondary | ICD-10-CM | POA: Diagnosis not present

## 2019-06-19 DIAGNOSIS — Z961 Presence of intraocular lens: Secondary | ICD-10-CM | POA: Diagnosis not present

## 2019-06-19 DIAGNOSIS — H35373 Puckering of macula, bilateral: Secondary | ICD-10-CM | POA: Diagnosis not present

## 2019-06-19 DIAGNOSIS — H40013 Open angle with borderline findings, low risk, bilateral: Secondary | ICD-10-CM | POA: Diagnosis not present

## 2019-06-19 LAB — HM DIABETES EYE EXAM

## 2019-07-01 ENCOUNTER — Telehealth: Payer: Self-pay

## 2019-07-01 NOTE — Telephone Encounter (Signed)
Patient calls nurse line stating she was never able to pick up [place]. I called the pharmacy, and a PA is needed.   Prior Auth completed in covermymeds for [employer].   Key: [id number]  Will check the status in 24-48hrs.

## 2019-07-02 ENCOUNTER — Encounter: Payer: Self-pay | Admitting: Family Medicine

## 2019-07-02 NOTE — Telephone Encounter (Signed)
Pt states that she called her insurance company and they assure her that if the provider calls and gives them additional info that the meter will be approved.  Pt provides the following number for PCP. [phone]. Christen Bame, CMA

## 2019-07-02 NOTE — Telephone Encounter (Signed)
FreeStyle [place] PA has been denied by her insurance.

## 2019-07-23 ENCOUNTER — Ambulatory Visit: Payer: Medicare HMO

## 2019-07-28 ENCOUNTER — Other Ambulatory Visit: Payer: Self-pay | Admitting: *Deleted

## 2019-07-28 MED ORDER — "INSULIN SYRINGE-NEEDLE U-100 31G X 5/16"" 0.5 ML MISC": 2 refills | Status: DC

## 2019-07-29 DIAGNOSIS — D631 Anemia in chronic kidney disease: Secondary | ICD-10-CM | POA: Diagnosis not present

## 2019-07-29 DIAGNOSIS — E1122 Type 2 diabetes mellitus with diabetic chronic kidney disease: Secondary | ICD-10-CM | POA: Diagnosis not present

## 2019-07-29 DIAGNOSIS — N184 Chronic kidney disease, stage 4 (severe): Secondary | ICD-10-CM | POA: Diagnosis not present

## 2019-07-29 DIAGNOSIS — N2581 Secondary hyperparathyroidism of renal origin: Secondary | ICD-10-CM | POA: Diagnosis not present

## 2019-07-29 DIAGNOSIS — I129 Hypertensive chronic kidney disease with stage 1 through stage 4 chronic kidney disease, or unspecified chronic kidney disease: Secondary | ICD-10-CM | POA: Diagnosis not present

## 2019-07-29 DIAGNOSIS — Z6835 Body mass index (BMI) 35.0-35.9, adult: Secondary | ICD-10-CM | POA: Diagnosis not present

## 2019-08-10 ENCOUNTER — Telehealth: Payer: Self-pay | Admitting: *Deleted

## 2019-08-10 NOTE — Telephone Encounter (Signed)
Pt calls because she has a bruise on the bottom of her foot.  She noticed it in the shower this am and is concerned because she is on eliquis.  Pt denies pain, because she has neuropathy and states that the bruise is about the size of a quarter.   Spoke with Dr. Wendy Poet, he suggest staying off of it as much as possible and to call us if it gets bigger.    Pt informed and agreeable.   To PCP.  Christen Bame, CMA

## 2019-08-17 ENCOUNTER — Encounter: Payer: Self-pay | Admitting: Family Medicine

## 2019-09-14 ENCOUNTER — Ambulatory Visit
Admission: RE | Admit: 2019-09-14 | Discharge: 2019-09-14 | Disposition: A | Discharge status: Home / Self Care | Payer: Medicare HMO | Source: Ambulatory Visit | Attending: Family Medicine | Admitting: Family Medicine

## 2019-09-14 ENCOUNTER — Other Ambulatory Visit: Payer: Self-pay | Admitting: *Deleted

## 2019-09-14 ENCOUNTER — Other Ambulatory Visit: Payer: Self-pay

## 2019-09-14 DIAGNOSIS — Z1231 Encounter for screening mammogram for malignant neoplasm of breast: Secondary | ICD-10-CM | POA: Diagnosis not present

## 2019-09-14 MED ORDER — CARVEDILOL 12.5 MG PO TABS: 12.5000 mg | ORAL_TABLET | Freq: Two times a day (BID) | ORAL | 3 refills | Status: DC

## 2019-11-04 ENCOUNTER — Other Ambulatory Visit: Payer: Self-pay | Admitting: Family Medicine

## 2019-12-01 IMAGING — CR DG CHEST 2V
2 series · 2 of 2 positions shown · non-contrast
Comparison: 02/13/2013

CLINICAL DATA: Dizziness 1 day short of breath

EXAM:
CHEST - 2 VIEW

[chest lat]
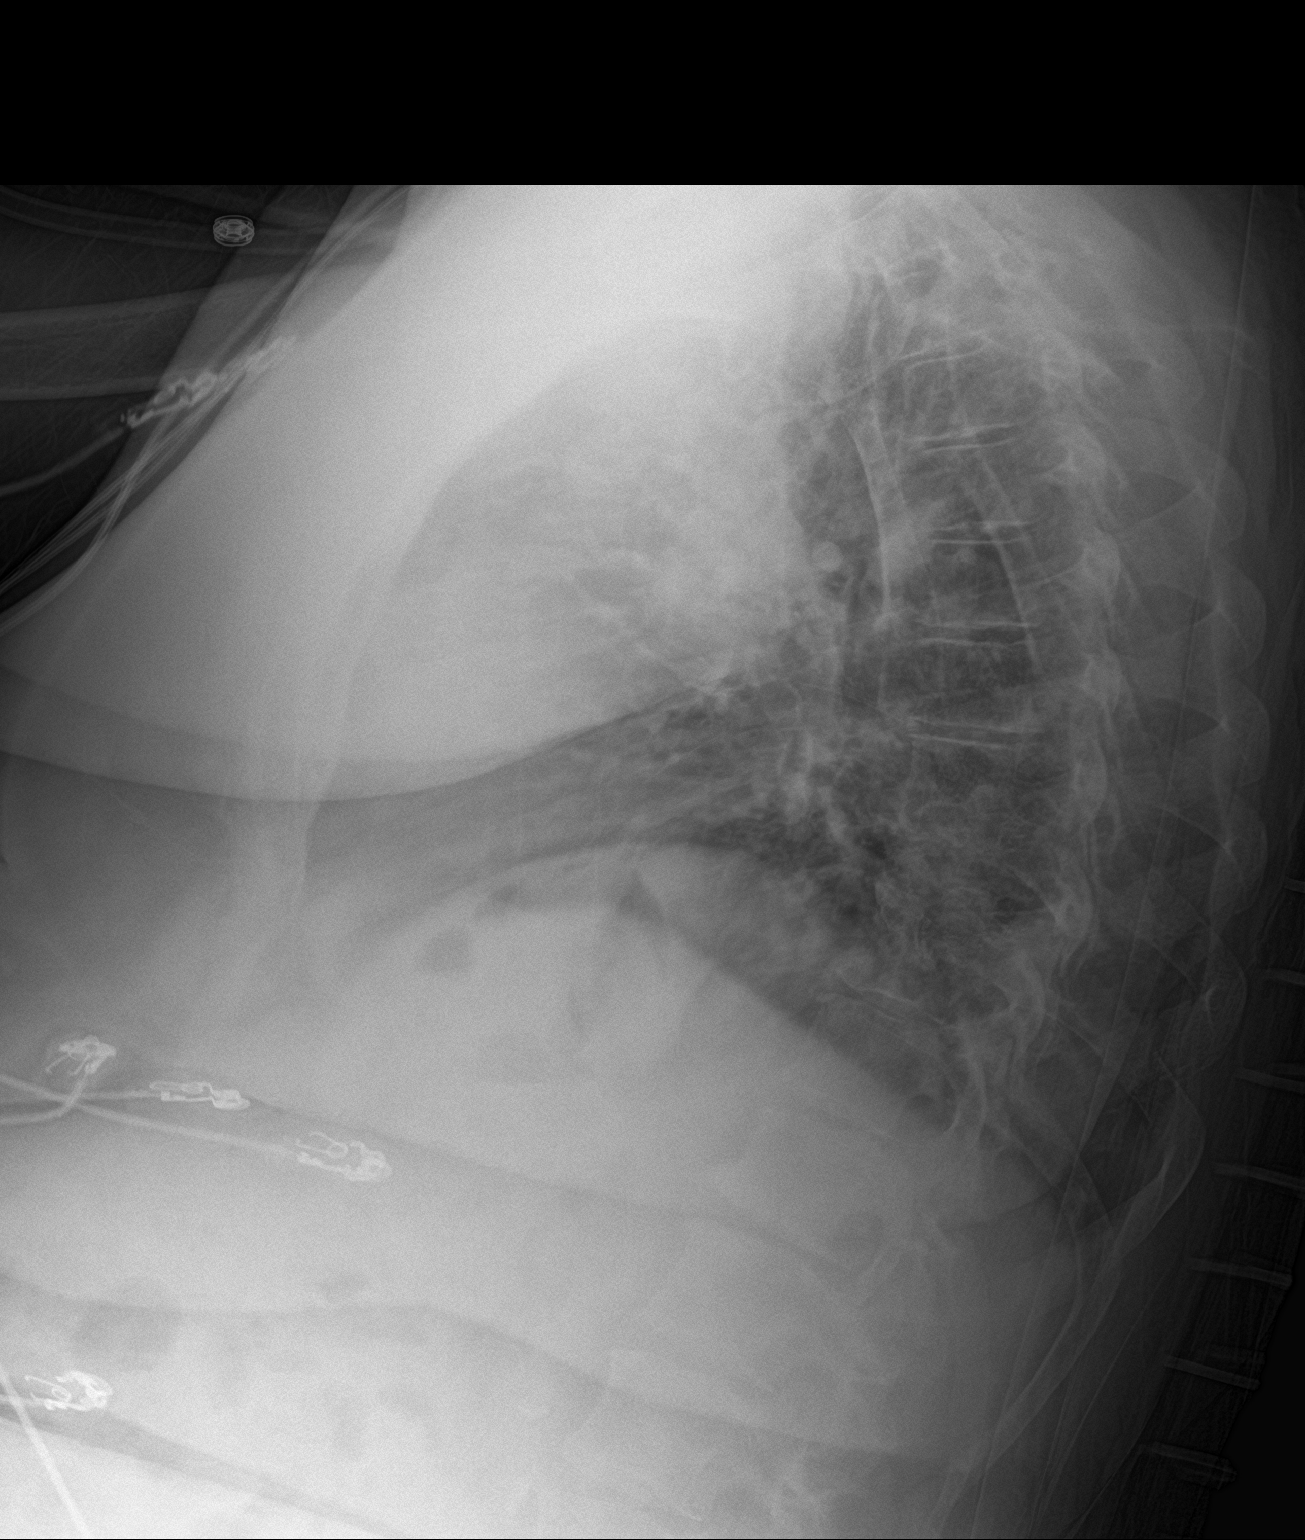

[chest ap]
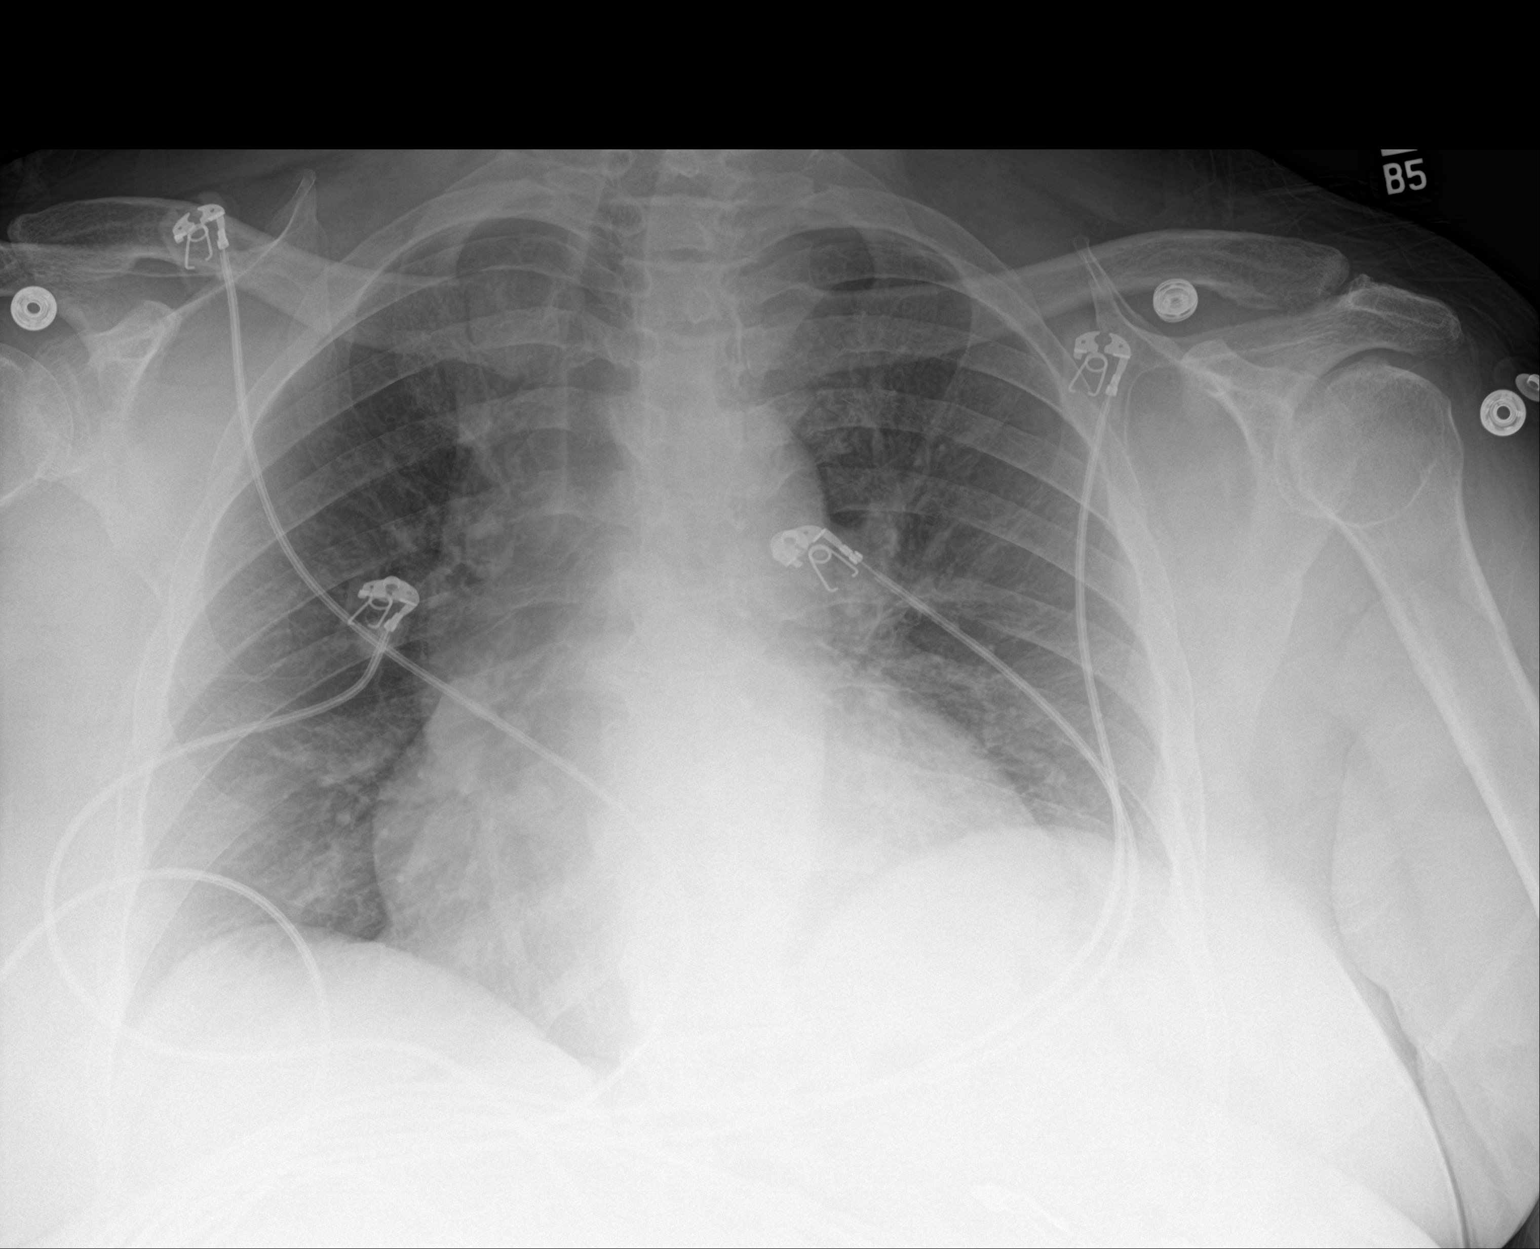

[2 of 2 positions shown; findings below may reference images not displayed]

FINDINGS: Cardiac enlargement. Mild vascular congestion without edema or
effusion. Negative for pneumonia
IMPRESSION: Cardiac enlargement with mild vascular congestion. Negative for
edema.

## 2019-12-17 ENCOUNTER — Other Ambulatory Visit: Payer: Self-pay | Admitting: *Deleted

## 2019-12-17 MED ORDER — APIXABAN 2.5 MG PO TABS: 2.5000 mg | ORAL_TABLET | Freq: Two times a day (BID) | ORAL | 3 refills | Status: DC

## 2019-12-17 NOTE — Telephone Encounter (Signed)
Pt only has one pill left, needs this sent in asap to [place]. Brenetta Penny Kennon Holter, CMA

## 2019-12-21 ENCOUNTER — Other Ambulatory Visit: Payer: Self-pay

## 2019-12-21 ENCOUNTER — Ambulatory Visit (INDEPENDENT_AMBULATORY_CARE_PROVIDER_SITE_OTHER): Payer: Medicare HMO | Admitting: Family Medicine

## 2019-12-21 ENCOUNTER — Encounter: Payer: Self-pay | Admitting: Family Medicine

## 2019-12-21 VITALS — BP 152/76 | HR 74 | Ht 67.0 in | Wt 241.4 lb

## 2019-12-21 DIAGNOSIS — Z794 Long term (current) use of insulin: Secondary | ICD-10-CM

## 2019-12-21 DIAGNOSIS — E785 Hyperlipidemia, unspecified: Secondary | ICD-10-CM

## 2019-12-21 DIAGNOSIS — E1142 Type 2 diabetes mellitus with diabetic polyneuropathy: Secondary | ICD-10-CM

## 2019-12-21 DIAGNOSIS — I10 Essential (primary) hypertension: Secondary | ICD-10-CM | POA: Diagnosis not present

## 2019-12-21 DIAGNOSIS — E1169 Type 2 diabetes mellitus with other specified complication: Secondary | ICD-10-CM

## 2019-12-21 LAB — POCT GLYCOSYLATED HEMOGLOBIN (HGB A1C): HbA1c, POC (controlled diabetic range): 7.2 % — AB (ref 0.0–7.0)

## 2019-12-21 MED ORDER — INSULIN REGULAR HUMAN 100 UNIT/ML IJ SOLN: 5.0000 [IU] | Freq: Three times a day (TID) | INTRAMUSCULAR | 2 refills | Status: DC

## 2019-12-21 NOTE — Progress Notes (Signed)
SUBJECTIVE:   CHIEF COMPLAINT / HPI:   Neuropathy: Patient reports that she is having bilateral lower leg pain.  Describes the pain as sharp pains that come in waves.  She reports that the pain happens periodically and lasts a day or 2 at a time. Nothing makes it worse or better that she has noticed.  She reports that she believes it is due to neuropathy.  She denies any numbness, tingling or weakness.  She has heard that alpha lipoic acid may help for neuropathy but is interested in a different medication if she could.  Patient reports that she is following with nephrology for her CKD and will see them on Wednesday.  She is heard about Lyrica and we discussed possibly starting this but would like for her to talk to her nephrologist first.  Diabetes: Blood Sugar ranges-90-130's fasting, lowest has been in the 40's 1-2 times and she believes it was because she gave too much insulin without eating enough  Checks blood sugar 3-4 times per day fasting Medications: Lantus 25 units, NovoLog 5-10 units 3 times daily Compliance: yes On Eliquis, and on statin Last eye exam: recently Last foot exam:  Patient would like to defer this until our next appointment ROS: denies dizziness, diaphoresis, LOC, polyuria, polydipsia  Hypertension: - Medications: Imdur 30 mg, carvedilol 12.5 mg, hydralazine 50 mg 3 times daily.  Patient reports she has been working with her nephrologist - Compliance: yes - Checking BP at home: yes - Denies any SOB, CP, vision changes, LE edema, medication SEs, or symptoms of hypotension -Patient would like to have continuous blood glucose monitor and continuous insulin if possible.  Previously denied for freestyle libre for unknown reasons.   PERTINENT  PMH / PSH: HTN, CAD, chronic A. fib, T2DM, CKD stage IV, h/o CVA, HLD  OBJECTIVE:  BP (!) 152/76   Pulse 74   Ht 5\' 7"  (1.702 m)   Wt 241 lb 6.4 oz (109.5 kg)   SpO2 96%   BMI 37.81 kg/m   General: NAD, pleasant Neck:  Supple, no LAD Cardiovascular: no LE edema Respiratory:  normal work of breathing Neuro: CN II-XII grossly intact Psych: AOx3, appropriate affect  ASSESSMENT/PLAN:   Diabetes mellitus A1c 7.2 today.  Patient reports that she is having occasional lows into the 40s due to her getting too much sliding scale insulin.  We will work on obtaining patient continuous blood glucose monitor and then will adjust medications.  Ideally patient would decrease her sliding scale given her lows only happened after the short acting patient may need to increase longer acting insulin.  Will forward chart to pharmacy in order for them to assist in obtaining monitor.  Hypertension BP 152/76.  Patient has been following closely with nephrology and follows up with him on Wednesday.  She is on hydralazine, Imdur and Coreg.  Will avoid increasing these medications at this time due to her following up with nephrology and they have been making medication adjustments.  Patient currently denies any shortness of breath, chest pain or lower extremity swelling.  Diabetic neuropathy Patient reports that her neuropathy is getting worse and she is interested in starting medications for this.  Patient would be a good candidate for Lyrica and will start her on this.  She states she has not been able to tolerate gabapentin in the past due to it making her drowsy.  She may also try over-the-counter alpha lipoic acid to see if that will help.  Hyperlipidemia Patient compliant  with Lipitor 80 mg, LDL at goal of <70, with level on lipid panel of 69.  Continue current management.    [place] Anesa Fronek, DO PGY-3, [facility]

## 2019-12-21 NOTE — Patient Instructions (Addendum)
Thank you for coming to see me today. It was a pleasure! Today we talked about:   Please ask your kidney doctor about starting Lyrica at your appointment on Wednesday.  Please also let us know if he has made any adjustments to your blood pressure medication at that visit.  You may try the alpha lipoic acid for your neuropathy if you would like as there is not much harm.  We will call you with your lab results by the end of this week. I will try to have freestyle libre approved for you.  Please follow-up with me in 3 months or sooner as needed.  If you have any questions or concerns, please do not hesitate to call the office at [phone].  Take Care,   Martinique Journee Bobrowski, DO

## 2019-12-22 LAB — BASIC METABOLIC PANEL
BUN/Creatinine Ratio: 11 — ABNORMAL LOW (ref 12–28)
BUN: 30 mg/dL — ABNORMAL HIGH (ref 8–27)
CO2: 21 mmol/L (ref 20–29)
Calcium: 9.1 mg/dL (ref 8.7–10.3)
Chloride: 102 mmol/L (ref 96–106)
Creatinine, Ser: 2.63 mg/dL — ABNORMAL HIGH (ref 0.57–1.00)
GFR calc Af Amer: 21 mL/min/{1.73_m2} — ABNORMAL LOW (ref >59)
GFR calc non Af Amer: 19 mL/min/{1.73_m2} — ABNORMAL LOW (ref >59)
Glucose: 194 mg/dL — ABNORMAL HIGH (ref 65–99)
Potassium: 5.4 mmol/L — ABNORMAL HIGH (ref 3.5–5.2)
Sodium: 138 mmol/L (ref 134–144)

## 2019-12-22 LAB — LIPID PANEL
Chol/HDL Ratio: 2.7 ratio (ref 0.0–4.4)
Cholesterol, Total: 134 mg/dL (ref 100–199)
HDL: 49 mg/dL (ref >39)
LDL Chol Calc (NIH): 69 mg/dL (ref 0–99)
Triglycerides: 82 mg/dL (ref 0–149)
VLDL Cholesterol Cal: 16 mg/dL (ref 5–40)

## 2019-12-22 LAB — CBC
Hematocrit: 31.5 % — ABNORMAL LOW (ref 34.0–46.6)
Hemoglobin: 9.9 g/dL — ABNORMAL LOW (ref 11.1–15.9)
MCH: 25.7 pg — ABNORMAL LOW (ref 26.6–33.0)
MCHC: 31.4 g/dL — ABNORMAL LOW (ref 31.5–35.7)
MCV: 82 fL (ref 79–97)
Platelets: 366 10*3/uL (ref 150–450)
RBC: 3.85 x10E6/uL (ref 3.77–5.28)
RDW: 14.4 % (ref 11.7–15.4)
WBC: 8.7 10*3/uL (ref 3.4–10.8)

## 2019-12-23 ENCOUNTER — Other Ambulatory Visit: Payer: Self-pay | Admitting: Family Medicine

## 2019-12-23 NOTE — Assessment & Plan Note (Signed)
Patient compliant with Lipitor 80 mg, LDL at goal of <70, with level on lipid panel of 69.  Continue current management.

## 2019-12-23 NOTE — Assessment & Plan Note (Signed)
BP 152/76.  Patient has been following closely with nephrology and follows up with him on Wednesday.  She is on hydralazine, Imdur and Coreg.  Will avoid increasing these medications at this time due to her following up with nephrology and they have been making medication adjustments.  Patient currently denies any shortness of breath, chest pain or lower extremity swelling.

## 2019-12-23 NOTE — Assessment & Plan Note (Signed)
Patient reports that her neuropathy is getting worse and she is interested in starting medications for this.  Patient would be a good candidate for Lyrica and will start her on this.  She states she has not been able to tolerate gabapentin in the past due to it making her drowsy.  She may also try over-the-counter alpha lipoic acid to see if that will help.

## 2019-12-23 NOTE — Assessment & Plan Note (Signed)
A1c 7.2 today.  Patient reports that she is having occasional lows into the 40s due to her getting too much sliding scale insulin.  We will work on obtaining patient continuous blood glucose monitor and then will adjust medications.  Ideally patient would decrease her sliding scale given her lows only happened after the short acting patient may need to increase longer acting insulin.  Will forward chart to pharmacy in order for them to assist in obtaining monitor.

## 2019-12-24 ENCOUNTER — Telehealth: Payer: Self-pay | Admitting: Pharmacist

## 2019-12-24 NOTE — Telephone Encounter (Signed)
Called patient on 12/24/2019 at 1:02 PM   Patient is interested in using CGM. She is on basal/bolus insulin regimen. Discussed with patient she will likely have to obtain via DME due to how she has Medicare. Will be meeting with Freestyle Libre rep next Thursday (12/31/19). Plan to learn more about cost/affordability. Will report back to patient information and further determine if she will be able to afford Freestyle Libre CGM as dexcom CGM is likely more expensive.  Patient also had questions regarding omnipod use. Will address at follow up call.  Thank you for involving pharmacy to assist in providing this patient's care.   Drexel Iha, PharmD PGY2 Ambulatory Care Pharmacy Resident

## 2019-12-27 ENCOUNTER — Encounter: Payer: Self-pay | Admitting: Family Medicine

## 2020-01-01 ENCOUNTER — Telehealth: Payer: Self-pay | Admitting: Pharmacist

## 2020-01-01 NOTE — Telephone Encounter (Signed)
Called patient on 01/01/2020 at 2:52 PM   It appears patient's insurance [facility] HMO) is contracted with the DME supplier, [employer]. Provided patient with contact phone number ([phone]) to contact to discuss continuous glucose monitor (CGM) therapy (either Dexcom or Freestyle Libre) and determine if [place] representative can run test claim and give her copay cost information regarding each CGM. Will follow up with patient on Monday (01/04/20) to determine how conversation went and to see which CGM she would prefer therapy with and will fax appropriate paperwork to [place] at that time.  Thank you for involving pharmacy to assist in providing this patient's care.   Drexel Iha, PharmD PGY2 Ambulatory Care Pharmacy Resident

## 2020-01-06 NOTE — Telephone Encounter (Signed)
Called patient on 01/06/2020 at 1:46 PM   Followed up with patient regarding DME supplier and CGM use to see if there is anything I could to do help facilitate process of obtaining CGM (e.g., fax chart notes to DME, etc) or to answer any questions regarding process. She has not contacted DME supplier and is planning to do so later today. Will follow up with patient in 1 week.  Thank you for involving pharmacy to assist in providing this patient's care.   Drexel Iha, PharmD PGY2 Ambulatory Care Pharmacy Resident

## 2020-01-15 ENCOUNTER — Telehealth: Payer: Self-pay | Admitting: Pharmacist

## 2020-01-15 NOTE — Telephone Encounter (Signed)
Patient LVM returning phone call from [facility]. Please return call to [phone]  Routing to pharmacist.   Talbot Grumbling, RN

## 2020-01-15 NOTE — Telephone Encounter (Signed)
Called patient on 01/15/2020 at 2:55 PM   Patient's insurance [facility] HMO) is contracted with the DME supplier, [employer]. Patient states she successfully contacted DME supplier regarding Freestyle Libre 2.0 continuous glucose monitor (CGM). She states the reader will cost $31 and sensors will cost $120 (she is unsure of how many sensors they will send for $120).   [employer] will be faxing paperwork to Dr. Enid Derry regarding approval for CGM (likely chart notes/ICD codes). Patient administers 4 insulin injections daily so approval should not be an issue.   Advised patient once she receives Freestyle Libre CGM from [place] to contact Family Medicine and schedule an appointment with pharmacist for [employer] 2.0 CGM education. Patient verbalized understanding.   Thank you for involving pharmacy to assist in providing this patient's care.   Drexel Iha, PharmD PGY2 Ambulatory Care Pharmacy Resident

## 2020-01-15 NOTE — Telephone Encounter (Signed)
Called patient on 01/15/2020 at 2:19 PM and left HIPAA-compliant VM with instructions to call [facility] clinic back   Plan to discuss assistance with DME supplier if she is interested in obtaining CGM. Given upcoming off-site rotation and end of residency approaching will be unable to follow up with patient again. Will await returned phone call at this time.  Thank you for involving pharmacy to assist in providing this patient's care.   Drexel Iha, PharmD PGY2 Ambulatory Care Pharmacy Resident

## 2020-01-25 DIAGNOSIS — N2581 Secondary hyperparathyroidism of renal origin: Secondary | ICD-10-CM | POA: Diagnosis not present

## 2020-01-25 DIAGNOSIS — Z6835 Body mass index (BMI) 35.0-35.9, adult: Secondary | ICD-10-CM | POA: Diagnosis not present

## 2020-01-25 DIAGNOSIS — D631 Anemia in chronic kidney disease: Secondary | ICD-10-CM | POA: Diagnosis not present

## 2020-01-25 DIAGNOSIS — N184 Chronic kidney disease, stage 4 (severe): Secondary | ICD-10-CM | POA: Diagnosis not present

## 2020-01-25 DIAGNOSIS — E1122 Type 2 diabetes mellitus with diabetic chronic kidney disease: Secondary | ICD-10-CM | POA: Diagnosis not present

## 2020-01-25 DIAGNOSIS — I129 Hypertensive chronic kidney disease with stage 1 through stage 4 chronic kidney disease, or unspecified chronic kidney disease: Secondary | ICD-10-CM | POA: Diagnosis not present

## 2020-01-26 DIAGNOSIS — E1165 Type 2 diabetes mellitus with hyperglycemia: Secondary | ICD-10-CM | POA: Diagnosis not present

## 2020-02-04 ENCOUNTER — Ambulatory Visit: Payer: Medicare HMO | Admitting: Pharmacist

## 2020-02-11 ENCOUNTER — Other Ambulatory Visit: Payer: Self-pay

## 2020-02-11 ENCOUNTER — Ambulatory Visit (INDEPENDENT_AMBULATORY_CARE_PROVIDER_SITE_OTHER): Payer: Medicare HMO | Admitting: Pharmacist

## 2020-02-11 DIAGNOSIS — E1169 Type 2 diabetes mellitus with other specified complication: Secondary | ICD-10-CM

## 2020-02-11 DIAGNOSIS — Z794 Long term (current) use of insulin: Secondary | ICD-10-CM

## 2020-02-11 NOTE — Assessment & Plan Note (Signed)
-  Hendrix Professional Continuous Glucose Monitor sensor. Placed date: 02/11/20, Scheduled off date: 02/25/20. Serial number: [id number]. Lot [id number]. Exp date: 08/23/2020.  -Advised patient to bring sensor back to clinic if it falls off early, avoid salicylic acid and vitamin C products while wearing sensor, and continue checking blood glucose and taking medications as normal.  -Informational brochure provided.Following instruction patient verbalize understanding of treatment.  -Will return to clinic in June for visit with PCP, Dr. Martinique.  We will continue to assess data and adjust medications as needed.  She has agreed to join remote monitoring.  I have sent her a libreview invite to her email.

## 2020-02-11 NOTE — Progress Notes (Signed)
    S:     Chief Complaint  Patient presents with  . Medication Management    CGM Placement    Patient arrives in good spirits, no apparent distress and is ambulating with a cane. She is accompanied by her son. Patient is here primarily for placement of FreeStyle Libre Professional Continuous Glucose Monitor sensor. Patient was referred and last seen by Primary Care Provider on Dr. Enid Derry, Patient was referred on 12/21/19.     Insurance coverage/medication affordability: [employer] HMO   O:  Physical Exam Constitutional:      Appearance: She is obese.  Neurological:     Mental Status: She is alert.  Psychiatric:        Mood and Affect: Mood normal.        Behavior: Behavior normal.        Thought Content: Thought content normal.        Judgment: Judgment normal.      Review of Systems  All other systems reviewed and are negative.    Lab Results  Component Value Date   HGBA1C 7.2 (A) 12/21/2019   There were no vitals filed for this visit.  Lipid Panel     Component Value Date/Time   CHOL 134 12/21/2019 1802   TRIG 82 12/21/2019 1802   HDL 49 12/21/2019 1802   CHOLHDL 2.7 12/21/2019 1802   CHOLHDL 3.3 07/13/2014 1520   VLDL 17 07/13/2014 1520   LDLCALC 69 12/21/2019 1802      A/P: -[place] Professional Continuous Glucose Monitor sensor. Placed date: 02/11/20, Scheduled off date: 02/25/20. Serial number: [id number]. Lot [id number]. Exp date: 08/23/2020.  -Advised patient to bring sensor back to clinic if it falls off early, avoid salicylic acid and vitamin C products while wearing sensor, and continue checking blood glucose and taking medications as normal.  -Informational brochure provided.Following instruction patient verbalize understanding of treatment.  -Will return to clinic in June for visit with PCP, Dr. Martinique.  We will continue to assess data and adjust medications as needed.  She has agreed to join remote monitoring.  I have sent her  a libreview invite to her email.  Email: [email or web address]  Written patient instructions provided.  Total time in face to face counseling 30 minutes. Patient seen with Sherre Poot, PharmD Candidate and Cristela Felt, PharmD PGY-1 Resident.

## 2020-02-11 NOTE — Patient Instructions (Addendum)
Nice to meet you today.   Please call if you have any FreeStyle Libre problems.

## 2020-02-23 ENCOUNTER — Telehealth: Payer: Self-pay

## 2020-02-23 NOTE — Progress Notes (Signed)
Reviewed: I agree with the Dr. Graylin Shiver documentation and management.

## 2020-02-23 NOTE — Telephone Encounter (Signed)
Patient calls nurse line stating she received her 1st covid vaccine on 5/20. Patient reports on 5/28 she began to have unwanted symptoms. Patient reports she became feverish, had chills, headache,.and nausea/vomitting. Patient states she feels a lot better today, however still experiencing some GI symptoms. Patient would like to speak with a provider about the possibility of having covid and moving forward with 2nd vaccine. Virtual scheduled.

## 2020-02-24 ENCOUNTER — Telehealth (INDEPENDENT_AMBULATORY_CARE_PROVIDER_SITE_OTHER): Payer: Medicare HMO | Admitting: Family Medicine

## 2020-02-24 ENCOUNTER — Other Ambulatory Visit: Payer: Self-pay

## 2020-02-24 ENCOUNTER — Encounter: Payer: Self-pay | Admitting: Family Medicine

## 2020-02-24 VITALS — Wt 244.0 lb

## 2020-02-24 DIAGNOSIS — Z7189 Other specified counseling: Secondary | ICD-10-CM

## 2020-02-24 NOTE — Assessment & Plan Note (Signed)
Patient experienced flulike symptoms 10 days after her first Covid vaccine.  Unlikely to be related to the vaccination given the elapsed time.  In between the 2 events.  Most likely she had some sort of viral infection, but uncertain if it was Covid.  Advised the patient to get tested for Covid, and if negative she may get the second dose at any time.  If positive, advised patient to self isolate for 10 days past symptoms onset and to wait 90 days before getting second vaccine.  Also advised patient to call back before getting the second vaccine if she is positive.

## 2020-02-24 NOTE — Progress Notes (Signed)
[place] Telemedicine Visit  Patient consented to have virtual visit and was identified by name and date of birth. Method of visit: Telephone  Encounter participants: Patient: [name] - located at home Provider: Benay Pike - located at [facility] Others (if applicable): None  Chief Complaint: Fevers/chills  HPI:  The patient got her first Covid vaccine on May 20.  8 days later she felt sick with fever/chills, headache, nausea, dry heaving fatigue.  Patient slept most of the day for 1-1/2 days.  She now feels better.  She had no Covid exposures during this time.  She lives with 2 sons who both have jobs.  She has not been tested for coronavirus.  She does not have any respiratory issues during this time.  She takes her blood sugar regularly, and has never had any values over 200 recently.  She would like to know if she can get the second vaccine shot.  ROS: per HPI  Pertinent PMHx: Diabetes  Exam:  Wt 244 lb (110.7 kg)   BMI 38.22 kg/m   Respiratory: Speaking full sentences.  No cough.  Assessment/Plan:  Counseled about COVID-19 virus infection Patient experienced flulike symptoms 10 days after her first Covid vaccine.  Unlikely to be related to the vaccination given the elapsed time.  In between the 2 events.  Most likely she had some sort of viral infection, but uncertain if it was Covid.  Advised the patient to get tested for Covid, and if negative she may get the second dose at any time.  If positive, advised patient to self isolate for 10 days past symptoms onset and to wait 90 days before getting second vaccine.  Also advised patient to call back before getting the second vaccine if she is positive.    Time spent during visit with patient: 15 minutes

## 2020-02-26 ENCOUNTER — Other Ambulatory Visit: Payer: Self-pay | Admitting: Family Medicine

## 2020-03-11 ENCOUNTER — Other Ambulatory Visit: Payer: Self-pay | Admitting: Family Medicine

## 2020-03-16 ENCOUNTER — Other Ambulatory Visit: Payer: Self-pay | Admitting: Family Medicine

## 2020-03-21 ENCOUNTER — Other Ambulatory Visit: Payer: Self-pay | Admitting: *Deleted

## 2020-03-22 ENCOUNTER — Ambulatory Visit: Payer: Medicare HMO | Admitting: Family Medicine

## 2020-03-22 MED ORDER — APIXABAN 2.5 MG PO TABS: 2.5000 mg | ORAL_TABLET | Freq: Two times a day (BID) | ORAL | 3 refills | Status: DC

## 2020-03-30 ENCOUNTER — Encounter: Payer: Self-pay | Admitting: Nurse Practitioner

## 2020-04-28 ENCOUNTER — Ambulatory Visit: Payer: Medicare HMO | Admitting: Nurse Practitioner

## 2020-06-03 ENCOUNTER — Ambulatory Visit: Payer: Medicare HMO | Admitting: Family Medicine

## 2020-06-08 DIAGNOSIS — I129 Hypertensive chronic kidney disease with stage 1 through stage 4 chronic kidney disease, or unspecified chronic kidney disease: Secondary | ICD-10-CM | POA: Diagnosis not present

## 2020-06-08 DIAGNOSIS — Z6835 Body mass index (BMI) 35.0-35.9, adult: Secondary | ICD-10-CM | POA: Diagnosis not present

## 2020-06-08 DIAGNOSIS — E1122 Type 2 diabetes mellitus with diabetic chronic kidney disease: Secondary | ICD-10-CM | POA: Diagnosis not present

## 2020-06-08 DIAGNOSIS — N2581 Secondary hyperparathyroidism of renal origin: Secondary | ICD-10-CM | POA: Diagnosis not present

## 2020-06-08 DIAGNOSIS — D631 Anemia in chronic kidney disease: Secondary | ICD-10-CM | POA: Diagnosis not present

## 2020-06-08 DIAGNOSIS — N184 Chronic kidney disease, stage 4 (severe): Secondary | ICD-10-CM | POA: Diagnosis not present

## 2020-06-16 ENCOUNTER — Other Ambulatory Visit: Payer: Self-pay

## 2020-06-16 ENCOUNTER — Ambulatory Visit (INDEPENDENT_AMBULATORY_CARE_PROVIDER_SITE_OTHER): Payer: Medicare HMO | Admitting: Family Medicine

## 2020-06-16 ENCOUNTER — Encounter: Payer: Self-pay | Admitting: Family Medicine

## 2020-06-16 VITALS — BP 121/72 | HR 71 | Wt 233.6 lb

## 2020-06-16 DIAGNOSIS — Z23 Encounter for immunization: Secondary | ICD-10-CM | POA: Diagnosis not present

## 2020-06-16 DIAGNOSIS — Z794 Long term (current) use of insulin: Secondary | ICD-10-CM

## 2020-06-16 DIAGNOSIS — E11621 Type 2 diabetes mellitus with foot ulcer: Secondary | ICD-10-CM

## 2020-06-16 DIAGNOSIS — Z1211 Encounter for screening for malignant neoplasm of colon: Secondary | ICD-10-CM | POA: Diagnosis not present

## 2020-06-16 DIAGNOSIS — L97519 Non-pressure chronic ulcer of other part of right foot with unspecified severity: Secondary | ICD-10-CM

## 2020-06-16 DIAGNOSIS — Z0000 Encounter for general adult medical examination without abnormal findings: Secondary | ICD-10-CM

## 2020-06-16 DIAGNOSIS — E1169 Type 2 diabetes mellitus with other specified complication: Secondary | ICD-10-CM | POA: Diagnosis not present

## 2020-06-16 LAB — POCT GLYCOSYLATED HEMOGLOBIN (HGB A1C): HbA1c, POC (controlled diabetic range): 6.6 % (ref 0.0–7.0)

## 2020-06-16 NOTE — Progress Notes (Addendum)
    SUBJECTIVE:   CHIEF COMPLAINT / HPI:   Diabetes Current Regimen: Lantus 25 units, Novolin 5-10 units TID CBGs: Patient reports stopped using continuous glucose monitor as she felt the readings were inaccurate, recording levels as 10-20 points behind her glucometer. She has been recording levels manually; with fasting glucose reading typically ranging from 107-120. She endorses lower-end fasting blood sugar readings once or twice a week typically ~70-80. Lowest recorded low blood glucose of 48. She senses that her blood sugars are low with the onset of stomach pains or headaches.   Last A1c: 7.2 on 12/21/19 Last Eye Exam: 06/19/2019.  Statin: Patient stopped Lipitor 80 mg daily a month ago due to muscle aches.  ACE/ARB: No  Insomnia Patient reports trouble staying asleep (minimal issue falling asleep). Does not drink alcohol, or watch TV in the evening. She denies excessive, disruptive thoughts at night. She does not wake to urinate. She reports taking 2 tablets of melatonin on some nights. She would not like any additional medication for this, and feels it is not a significant issue at this time.   PERTINENT  PMH / PSH: DM, HTN, Coronary artery disease, CKD, stroke, MI  OBJECTIVE:   BP 121/72   Pulse 71   Wt 233 lb 9.6 oz (106 kg)   SpO2 93%   BMI 36.59 kg/m    General: Appears well, no acute distress. Pleasant and cooperative Extremities: Non-erythematous, non-purulent ulcer on left hallux. Dorsalis pedis pulse present bilaterally. No sensation in full plantar aspect bilaterally.  Skin: Warm and dry.  Neuro: alert and oriented Psych: normal affect   ASSESSMENT/PLAN:   HM -Foot exam today -Influenza vaccine today -Colonoscopy: GI referral   Diabetes mellitus Patient no longer using Freestyle Libre Professional CGM as she felt readings were inaccurate (10-20 units behind manual measurements). At current regimine of Lantus 25 units, Novolin 5-10 units TID; patient reports  notable hypoglycemic to near-hypoglycemic episodes 1-2 times weekly made apparent to patient from symptoms of stomach ache or headache. A1C of 6.6 today.  -Reduce Lantus dose to 20 units -Patient to call back within one week to let us know if continue to have hypoglycemic episodes on adjusted dose.   Hx of Statin Myopathy Patient reported discontinuing Lipitor a month ago due to muscle aches.  -Explained likelihood to tolerate another trial of this medication -Patient to re-start Lipitor, and let us know if she has any muscle aches on this medication again.    Diabetic foot ulcer Dry appearing, non-erythematous, non-purulent ulceration to left hallux in this patient with significant peripheral neuropathy bilaterally .  -Podiatry referral   Insomnia Patient reports trouble staying asleep is not a significant concern for her. No interest in additional medications for this at this time. She reports taking 2 tablets of nightly melatonin sporadically (unsure of dose). Disrupted sleep does not appear to be a result of urinary symptoms, or excessive worries. Does take green tea, sometimes later in the day -encouraged maintaining proper sleep hygiene -patient to limit green tea consumption later in the day  [place]   I was personally present and re-performed the exam and MDM and verified the service and findings are accurately documented in the student's note. Simone Autry-Lott, DO 06/17/20 12:14 PM

## 2020-06-16 NOTE — Progress Notes (Deleted)
    SUBJECTIVE:   CHIEF COMPLAINT / HPI:   Diabetes Current Regimen: Lantus 25 units, Novolin 5-10 units TID CBGs: ***  Last A1c: 7.2 on 12/21/19 Denies polyuria, polydipsia, hypoglycemia *** Last Eye Exam: *** Statin: Lipitor 80 mg daily ACE/ARB: ***    PERTINENT  PMH / PSH: ***  OBJECTIVE:   BP 121/72   Pulse 71   Wt 233 lb 9.6 oz (106 kg)   SpO2 93%   BMI 36.59 kg/m   General: Appears well, no acute distress. Age appropriate. Cardiac: RRR, normal heart sounds, no murmurs Respiratory: CTAB, normal effort Abdomen: soft, nontender, nondistended Extremities: No edema or cyanosis. Skin: Warm and dry, no rashes noted Neuro: alert and oriented, no focal deficits Psych: normal affect   ASSESSMENT/PLAN:   No problem-specific Assessment & Plan notes found for this encounter.     Gerlene Fee, [place]

## 2020-06-16 NOTE — Patient Instructions (Addendum)
It was wonderful to see you today.  Please bring ALL of your medications with you to every visit.   Today we talked about:  Diabetes. Your A1c wonderful. We have decreased Lantus to 20 units daily. Please call within 1 week to let us know if you continue to have hypoglycemic episode.   Restarting the Lipitor. Please let us know if you have muscle aches on this medication again.   We referred you to podiatry. Please see them to get that ulcer looked at.  We also referred you to GI for a colonoscopy. They will give you a call in 1-2 weeks to make an appointment.   Limiting green tea consumption near bedtime, and try reading before sleep.   Please be sure to schedule follow up at the front  desk before you leave today.   Please call the clinic at [phone] if your symptoms worsen or you have any concerns. It was our pleasure to serve you.  Dr. Janus Molder

## 2020-06-17 NOTE — Assessment & Plan Note (Signed)
Patient no longer using Freestyle Libre Professional CGM as she felt readings were inaccurate (10-20 units behind manual measurements). At current regimine of Lantus 25 units, Novolin 5-10 units TID; patient reports notable hypoglycemic to near-hypoglycemic episodes 1-2 times weekly made apparent to patient from symptoms of stomach ache or headache. A1C of 6.6 today.  -Reduce Lantus dose to 20 units -Patient to call back within one week to let us know if continue to have hypoglycemic episodes on adjusted dose.

## 2020-06-21 DIAGNOSIS — D571 Sickle-cell disease without crisis: Secondary | ICD-10-CM | POA: Diagnosis not present

## 2020-06-21 DIAGNOSIS — H40013 Open angle with borderline findings, low risk, bilateral: Secondary | ICD-10-CM | POA: Diagnosis not present

## 2020-06-21 DIAGNOSIS — E113513 Type 2 diabetes mellitus with proliferative diabetic retinopathy with macular edema, bilateral: Secondary | ICD-10-CM | POA: Diagnosis not present

## 2020-06-21 LAB — HM DIABETES EYE EXAM

## 2020-06-28 ENCOUNTER — Telehealth: Payer: Self-pay | Admitting: Family Medicine

## 2020-06-28 NOTE — Telephone Encounter (Signed)
Returned patient's call concerning an automated call that she received for scheduling a colonoscopy.  Patient states that call was generated from [facility].  Informed patient that I was not aware of such a call and that there were no notes concerning call.  Ozella Almond, [place]

## 2020-06-28 NOTE — Telephone Encounter (Signed)
Pt called stating she received an automated call from our office to hold to speak with someone to schedule her colonoscopy. Pt requesting a call back regarding that

## 2020-07-28 ENCOUNTER — Other Ambulatory Visit: Payer: Self-pay

## 2020-07-28 MED ORDER — ISOSORBIDE MONONITRATE ER 30 MG PO TB24
30.0000 mg | ORAL_TABLET | Freq: Every day | ORAL | 1 refills | Status: DC
Start: 2020-07-28 — End: 2020-12-28

## 2020-08-22 ENCOUNTER — Other Ambulatory Visit: Payer: Self-pay | Admitting: *Deleted

## 2020-08-23 MED ORDER — CALCITRIOL 0.25 MCG PO CAPS
ORAL_CAPSULE | ORAL | 1 refills | Status: DC
Start: 2020-08-23 — End: 2021-01-24

## 2020-09-29 ENCOUNTER — Other Ambulatory Visit: Payer: Self-pay | Admitting: Family Medicine

## 2020-09-29 DIAGNOSIS — Z1231 Encounter for screening mammogram for malignant neoplasm of breast: Secondary | ICD-10-CM

## 2020-10-24 ENCOUNTER — Other Ambulatory Visit: Payer: Self-pay

## 2020-10-26 MED ORDER — ACCU-CHEK AVIVA PLUS W/DEVICE KIT: PACK | 0 refills | Status: DC

## 2020-10-26 MED ORDER — INSULIN REGULAR HUMAN 100 UNIT/ML IJ SOLN: 5.0000 [IU] | Freq: Three times a day (TID) | INTRAMUSCULAR | 2 refills | Status: DC

## 2020-10-26 MED ORDER — INSULIN GLARGINE 100 UNIT/ML ~~LOC~~ SOLN: 20.0000 [IU] | Freq: Every day | SUBCUTANEOUS | 0 refills | Status: DC

## 2020-10-26 MED ORDER — ACCU-CHEK AVIVA PLUS VI STRP
ORAL_STRIP | 11 refills | Status: DC
Start: 2020-10-26 — End: 2021-11-27

## 2020-11-07 ENCOUNTER — Ambulatory Visit (INDEPENDENT_AMBULATORY_CARE_PROVIDER_SITE_OTHER): Payer: Medicare HMO

## 2020-11-07 ENCOUNTER — Other Ambulatory Visit: Payer: Self-pay

## 2020-11-07 ENCOUNTER — Other Ambulatory Visit: Payer: Self-pay | Admitting: Podiatry

## 2020-11-07 ENCOUNTER — Ambulatory Visit (INDEPENDENT_AMBULATORY_CARE_PROVIDER_SITE_OTHER): Payer: Medicare HMO | Admitting: Podiatry

## 2020-11-07 DIAGNOSIS — M19071 Primary osteoarthritis, right ankle and foot: Secondary | ICD-10-CM | POA: Diagnosis not present

## 2020-11-07 DIAGNOSIS — S9001XA Contusion of right ankle, initial encounter: Secondary | ICD-10-CM

## 2020-11-07 DIAGNOSIS — L97522 Non-pressure chronic ulcer of other part of left foot with fat layer exposed: Secondary | ICD-10-CM | POA: Diagnosis not present

## 2020-11-07 DIAGNOSIS — S9000XA Contusion of unspecified ankle, initial encounter: Secondary | ICD-10-CM

## 2020-11-07 NOTE — Progress Notes (Signed)
   HPI: 65 y.o. female presenting today PMHx type II DM presenting today for multiple complaints.  Patient was referred by her PCP for evaluation of a callus/ulcer to the plantar aspect of the left great toe.  Patient states that past summer she developed a wound to the plantar aspect of the left great toe but it has since healed.  She presents to have it evaluated Patient also states that she has chronic right ankle swelling secondary to a fracture that she sustained in 2016.  Patient underwent surgery to her right ankle and she does not experience any pain associated to the ankle, however she does feel that it is unsightly and she gets intermittent swelling.  She presents for further treatment evaluation  Past Medical History:  Diagnosis Date  . Anemia   . Atrial fibrillation ([place])   . CAD (coronary artery disease)   . Chronic kidney disease, stage III (moderate)   . Constipation   . Diabetes mellitus ([place])   . History of blood transfusion   . Hypertension   . Myocardial infarction [facility])    in April 2014     Physical Exam: General: The patient is alert and oriented x3 in no acute distress.  Dermatology: Skin is warm, dry and supple bilateral lower extremities. Negative for open lesions or macerations.  Hyperkeratotic preulcerative callus lesion noted to the left hallux  Vascular: Palpable pedal pulses bilaterally. No edema or erythema noted. Capillary refill within normal limits.  Neurological: Epicritic and protective threshold diminished bilaterally.   Musculoskeletal Exam: H/o ORIF right ankle.  Limited range of motion right ankle.  Negative for any significant pain on palpation.  Limited range of motion.  Overall enlargement of the right ankle joint based on clinical exam  Radiographic Exam:  Advanced degenerative changes noted to the right ankle joint.  There is a separated fragment of bone to the submalleolus medially.  Orthopedic hardware appears to be intact.  Talar tilt  noted.  No acute fracture identified  Assessment: 1.  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  H/o ORIF right ankle with advanced DJD 3.  Preulcerative callus lesion left hallux   Plan of Care:  1. Patient evaluated. X-Rays reviewed.  2.  Recommend compression socks daily to the right lower extremity 3.  Continue ankle brace right lower extremity 4.  Excisional debridement of the preulcerative callus lesion was performed using a chisel blade without incident or bleeding 5.  Return to clinic as needed      Edrick Kins, DPM [facility]  Dr. Edrick Kins, DPM    [address], [place] [address]                Office [phone]  Fax [phone]

## 2020-11-08 DIAGNOSIS — Z6835 Body mass index (BMI) 35.0-35.9, adult: Secondary | ICD-10-CM | POA: Diagnosis not present

## 2020-11-08 DIAGNOSIS — N184 Chronic kidney disease, stage 4 (severe): Secondary | ICD-10-CM | POA: Diagnosis not present

## 2020-11-08 DIAGNOSIS — N2581 Secondary hyperparathyroidism of renal origin: Secondary | ICD-10-CM | POA: Diagnosis not present

## 2020-11-08 DIAGNOSIS — D631 Anemia in chronic kidney disease: Secondary | ICD-10-CM | POA: Diagnosis not present

## 2020-11-08 DIAGNOSIS — I129 Hypertensive chronic kidney disease with stage 1 through stage 4 chronic kidney disease, or unspecified chronic kidney disease: Secondary | ICD-10-CM | POA: Diagnosis not present

## 2020-11-08 DIAGNOSIS — E1122 Type 2 diabetes mellitus with diabetic chronic kidney disease: Secondary | ICD-10-CM | POA: Diagnosis not present

## 2020-11-09 ENCOUNTER — Ambulatory Visit
Admission: RE | Admit: 2020-11-09 | Discharge: 2020-11-09 | Disposition: A | Discharge status: Home / Self Care | Payer: Medicare HMO | Source: Ambulatory Visit | Attending: Family Medicine | Admitting: Family Medicine

## 2020-11-09 ENCOUNTER — Other Ambulatory Visit: Payer: Self-pay

## 2020-11-09 DIAGNOSIS — Z1231 Encounter for screening mammogram for malignant neoplasm of breast: Secondary | ICD-10-CM | POA: Diagnosis not present

## 2020-11-14 ENCOUNTER — Other Ambulatory Visit: Payer: Self-pay | Admitting: *Deleted

## 2020-11-15 MED ORDER — "INSULIN SYRINGE-NEEDLE U-100 31G X 5/16"" 0.5 ML MISC": 2 refills | Status: DC

## 2020-12-27 ENCOUNTER — Other Ambulatory Visit: Payer: Self-pay | Admitting: Family Medicine

## 2020-12-29 ENCOUNTER — Other Ambulatory Visit: Payer: Self-pay

## 2020-12-29 MED ORDER — CARVEDILOL 12.5 MG PO TABS: 12.5000 mg | ORAL_TABLET | Freq: Two times a day (BID) | ORAL | 3 refills | Status: DC

## 2020-12-29 MED ORDER — APIXABAN 2.5 MG PO TABS: 2.5000 mg | ORAL_TABLET | Freq: Two times a day (BID) | ORAL | 3 refills | Status: DC

## 2020-12-30 ENCOUNTER — Other Ambulatory Visit: Payer: Self-pay | Admitting: Family Medicine

## 2020-12-30 DIAGNOSIS — I10 Essential (primary) hypertension: Secondary | ICD-10-CM

## 2020-12-30 MED ORDER — CARVEDILOL 12.5 MG PO TABS
12.5000 mg | ORAL_TABLET | Freq: Two times a day (BID) | ORAL | 3 refills | Status: DC
Start: 2020-12-30 — End: 2021-12-22

## 2021-01-23 ENCOUNTER — Other Ambulatory Visit: Payer: Self-pay | Admitting: Family Medicine

## 2021-01-30 NOTE — Progress Notes (Deleted)
    SUBJECTIVE:   CHIEF COMPLAINT / HPI:   Diabetes Current Regimen: *** CBGs: ***  Last A1c: *** on ***  Denies polyuria, polydipsia, hypoglycemia *** Last Eye Exam: *** Statin: *** ACE/ARB: ***   PERTINENT  PMH / PSH: ***  OBJECTIVE:   There were no vitals taken for this visit.  ***  ASSESSMENT/PLAN:   No problem-specific Assessment & Plan notes found for this encounter.     Gerlene Fee, [place]   {    This will disappear when note is signed, click to select method of visit    :1}

## 2021-01-31 ENCOUNTER — Ambulatory Visit: Payer: Medicare HMO | Admitting: Family Medicine

## 2021-02-07 ENCOUNTER — Ambulatory Visit: Payer: Medicare HMO | Admitting: Podiatry

## 2021-02-27 ENCOUNTER — Other Ambulatory Visit: Payer: Self-pay

## 2021-02-27 ENCOUNTER — Encounter: Payer: Self-pay | Admitting: Podiatry

## 2021-02-27 ENCOUNTER — Ambulatory Visit (INDEPENDENT_AMBULATORY_CARE_PROVIDER_SITE_OTHER): Payer: Medicare HMO | Admitting: Podiatry

## 2021-02-27 DIAGNOSIS — N184 Chronic kidney disease, stage 4 (severe): Secondary | ICD-10-CM | POA: Diagnosis not present

## 2021-02-27 DIAGNOSIS — E1142 Type 2 diabetes mellitus with diabetic polyneuropathy: Secondary | ICD-10-CM | POA: Diagnosis not present

## 2021-02-27 DIAGNOSIS — L84 Corns and callosities: Secondary | ICD-10-CM | POA: Diagnosis not present

## 2021-02-27 NOTE — Progress Notes (Addendum)
This patient returns to my office for at risk foot care.  This patient requires this care by a professional since this patient will be at risk due to having diabetic neuropathy and CKD. This patient is unable to cut nails himself since the patient cannot reach his nails.These nails are painful walking and wearing shoes.  This patient presents for at risk foot care today.  General Appearance  Alert, conversant and in no acute stress.  Vascular  Dorsalis pedis and posterior tibial  pulses are palpable  bilaterally.  Capillary return is within normal limits  bilaterally. Temperature is within normal limits  bilaterally.  Neurologic  Senn-Weinstein monofilament wire test within normal limits  bilaterally. Muscle power within normal limits bilaterally.  Nails  Normotropic nails with no infection or drainage noted.  Orthopedic  No limitations of motion  feet .  No crepitus or effusions noted.  No bony pathology or digital deformities noted.  History right ankle fracture.  Skin  normotropic skin with no porokeratosis noted bilaterally.  No signs of infections or ulcers noted.   Diffuse callus plantar fifth metabase right foot.  Callus right foot.  Consent was obtained for treatment procedures.  Debridement of diffuse callus with dremel tool usage.  Told to use vaseline.   Return office visit   prn                  Told patient to return for periodic foot care and evaluation due to potential at risk complications.   Gardiner Barefoot DPM

## 2021-02-28 ENCOUNTER — Other Ambulatory Visit: Payer: Self-pay

## 2021-02-28 ENCOUNTER — Ambulatory Visit (INDEPENDENT_AMBULATORY_CARE_PROVIDER_SITE_OTHER): Payer: Medicare HMO | Admitting: Family Medicine

## 2021-02-28 ENCOUNTER — Encounter: Payer: Self-pay | Admitting: Family Medicine

## 2021-02-28 VITALS — BP 140/80 | HR 61 | Wt 221.6 lb

## 2021-02-28 DIAGNOSIS — D649 Anemia, unspecified: Secondary | ICD-10-CM | POA: Diagnosis not present

## 2021-02-28 DIAGNOSIS — Z794 Long term (current) use of insulin: Secondary | ICD-10-CM

## 2021-02-28 DIAGNOSIS — R519 Headache, unspecified: Secondary | ICD-10-CM

## 2021-02-28 DIAGNOSIS — E1169 Type 2 diabetes mellitus with other specified complication: Secondary | ICD-10-CM

## 2021-02-28 DIAGNOSIS — G8929 Other chronic pain: Secondary | ICD-10-CM

## 2021-02-28 LAB — POCT GLYCOSYLATED HEMOGLOBIN (HGB A1C): HbA1c, POC (controlled diabetic range): 6.4 % (ref 0.0–7.0)

## 2021-02-28 NOTE — Progress Notes (Signed)
   SUBJECTIVE:   CHIEF COMPLAINT / HPI:   [name] is a 65 yo F who presents for the below:  Diabetes Current Regimen: Lantus 20 units daily, Novolin 5-10 units TID CBGs: Avg. 120-130, Lows 40s at night. Recently adjusted her Lantus to 15 units because of hypoglycemia  Last A1c: 6.6 on 06/16/2020  Denies polyuria, polydipsia, polyphagia Last Eye Exam: 06/21/2020 Statin: Lipitor 80 mg (not taking due to myalgias; amenable to restarting) ACE/ARB: n/a  Headache Over the last two months. Described as "low grade headache that is always there". Starts at back of neck on right side and migrates to front of head on right side. Has associated right eye watering. Has tried tylenol with some relief. Feels better with sleeping on her left side. Denies lost of vision, chest pain, weakness or numbness in face or limbs. States she does not sleep well waking up after 3-4 hours of sleep. Unaware whether she snores. Does not want medication for sleep at this time.   PERTINENT  PMH / PSH: CAD, Afib  OBJECTIVE:   BP 140/80   Pulse 61   Wt 221 lb 9.6 oz (100.5 kg)   SpO2 97%   BMI 34.71 kg/m   General: Appears well, no acute distress. Age appropriate. Cardiac: RRR, normal heart sounds, no murmurs Respiratory: CTAB, normal effort Psych: normal affect  Neuro: CN II: PERRL CN III, IV,VI: EOMI CV V: Normal sensation in V1, V2, V3 CVII: Symmetric smile and brow raise CN VIII: Normal hearing CN IX,X: Symmetric palate raise  CN XI: 5/5 shoulder shrug CN XII: Symmetric tongue protrusion  UE and LE strength 5/5 2+ UE and LE reflexes  Normal sensation in UE and LE bilaterally  Gait appears to be at baseline  ASSESSMENT/PLAN:   Diabetes mellitus A1c today 6.4. Having low to 40s. Decreased lantus to 15 units. I would prefer her to continue to decrease insulin. With shared decision making we decided decreasing lantus to 10-15 units depending on patient preference. Could consider decreasing meal  coverage as well. Discussed when to call and to have sugar tabs on hand if lows continue. Follow up in 3 months. Consider further reduction of insulin if appropriate. BMP today.  Chronic nonintractable headache, unspecified headache type Over several months with a constant duration. Neuro exam reassuring. Possible component of OSA; consider sleep study. Will obtain CBC/BMP at this time to look for anemia (hx of low hgb) or electrolyte causes. Could consider temporal arteritis but low suspicion at this time. If continues could trial steroid treatment at follow up. Of note patient wears non-religious head wrap could possible remove this for added relief, will discuss at follow up. - Symptom diary - Continue tylenol, sleep on left side - ED precautions given -  F/u in 2 weeks or sooner if needed  [place]

## 2021-02-28 NOTE — Patient Instructions (Addendum)
It was wonderful to see you today.  Please bring ALL of your medications with you to every visit.   Today we talked about:  Decreasing lantus to 10-15 units. Follow up in 3 months.   Stop taking coQ10 if not taking statin but please restart statin.  Keep headache diary log. Sleep on left side. Please go to emergency room if you have loss of vision or weakness with headache. Otherwise take tylenol for symptom relief. Follow up in 2 weeks.   Please be sure to schedule follow up at the front  desk before you leave today.   If you haven't already, sign up for My Chart to have easy access to your labs results, and communication with your primary care physician.  Please call the clinic at [phone] if your symptoms worsen or you have any concerns. It was our pleasure to serve you.  Dr. Janus Molder

## 2021-03-01 LAB — BASIC METABOLIC PANEL
BUN/Creatinine Ratio: 12 (ref 12–28)
BUN: 33 mg/dL — ABNORMAL HIGH (ref 8–27)
CO2: 22 mmol/L (ref 20–29)
Calcium: 9.4 mg/dL (ref 8.7–10.3)
Chloride: 100 mmol/L (ref 96–106)
Creatinine, Ser: 2.81 mg/dL — ABNORMAL HIGH (ref 0.57–1.00)
Glucose: 116 mg/dL — ABNORMAL HIGH (ref 65–99)
Potassium: 5.1 mmol/L (ref 3.5–5.2)
Sodium: 137 mmol/L (ref 134–144)
eGFR: 18 mL/min/{1.73_m2} — ABNORMAL LOW (ref >59)

## 2021-03-01 LAB — CBC
Hematocrit: 30.7 % — ABNORMAL LOW (ref 34.0–46.6)
Hemoglobin: 9.6 g/dL — ABNORMAL LOW (ref 11.1–15.9)
MCH: 24.6 pg — ABNORMAL LOW (ref 26.6–33.0)
MCHC: 31.3 g/dL — ABNORMAL LOW (ref 31.5–35.7)
MCV: 79 fL (ref 79–97)
Platelets: 432 10*3/uL (ref 150–450)
RBC: 3.91 x10E6/uL (ref 3.77–5.28)
RDW: 15 % (ref 11.7–15.4)
WBC: 6.8 10*3/uL (ref 3.4–10.8)

## 2021-03-03 DIAGNOSIS — G8929 Other chronic pain: Secondary | ICD-10-CM | POA: Insufficient documentation

## 2021-03-03 DIAGNOSIS — R519 Headache, unspecified: Secondary | ICD-10-CM | POA: Insufficient documentation

## 2021-03-03 NOTE — Assessment & Plan Note (Addendum)
A1c today 6.4. Having low to 40s. Decreased lantus to 15 units. I would prefer her to continue to decrease insulin. With shared decision making we decided decreasing lantus to 10-15 units depending on patient preference. Could consider decreasing meal coverage as well. Discussed when to call and to have sugar tabs on hand if lows continue. Follow up in 3 months. Consider further reduction of insulin if appropriate. BMP today.

## 2021-03-09 ENCOUNTER — Encounter: Payer: Self-pay | Admitting: Gastroenterology

## 2021-03-23 DIAGNOSIS — D631 Anemia in chronic kidney disease: Secondary | ICD-10-CM | POA: Diagnosis not present

## 2021-03-23 DIAGNOSIS — N2581 Secondary hyperparathyroidism of renal origin: Secondary | ICD-10-CM | POA: Diagnosis not present

## 2021-03-23 DIAGNOSIS — I129 Hypertensive chronic kidney disease with stage 1 through stage 4 chronic kidney disease, or unspecified chronic kidney disease: Secondary | ICD-10-CM | POA: Diagnosis not present

## 2021-03-23 DIAGNOSIS — N184 Chronic kidney disease, stage 4 (severe): Secondary | ICD-10-CM | POA: Diagnosis not present

## 2021-03-23 DIAGNOSIS — Z6835 Body mass index (BMI) 35.0-35.9, adult: Secondary | ICD-10-CM | POA: Diagnosis not present

## 2021-03-23 DIAGNOSIS — E1122 Type 2 diabetes mellitus with diabetic chronic kidney disease: Secondary | ICD-10-CM | POA: Diagnosis not present

## 2021-04-12 ENCOUNTER — Encounter: Payer: Self-pay | Admitting: Gastroenterology

## 2021-04-12 ENCOUNTER — Other Ambulatory Visit (INDEPENDENT_AMBULATORY_CARE_PROVIDER_SITE_OTHER): Payer: Medicare HMO

## 2021-04-12 ENCOUNTER — Ambulatory Visit (INDEPENDENT_AMBULATORY_CARE_PROVIDER_SITE_OTHER): Payer: Medicare HMO | Admitting: Gastroenterology

## 2021-04-12 ENCOUNTER — Telehealth: Payer: Self-pay

## 2021-04-12 DIAGNOSIS — R1031 Right lower quadrant pain: Secondary | ICD-10-CM | POA: Diagnosis not present

## 2021-04-12 DIAGNOSIS — Z800 Family history of malignant neoplasm of digestive organs: Secondary | ICD-10-CM

## 2021-04-12 LAB — CBC
HCT: 28.4 % — ABNORMAL LOW (ref 36.0–46.0)
Hemoglobin: 9.2 g/dL — ABNORMAL LOW (ref 12.0–15.0)
MCHC: 32.4 g/dL (ref 30.0–36.0)
MCV: 77.1 fl — ABNORMAL LOW (ref 78.0–100.0)
Platelets: 411 10*3/uL — ABNORMAL HIGH (ref 150.0–400.0)
RBC: 3.69 Mil/uL — ABNORMAL LOW (ref 3.87–5.11)
RDW: 16.2 % — ABNORMAL HIGH (ref 11.5–15.5)
WBC: 6.5 10*3/uL (ref 4.0–10.5)

## 2021-04-12 LAB — BASIC METABOLIC PANEL
BUN: 37 mg/dL — ABNORMAL HIGH (ref 6–23)
CO2: 26 mEq/L (ref 19–32)
Calcium: 9 mg/dL (ref 8.4–10.5)
Chloride: 105 mEq/L (ref 96–112)
Creatinine, Ser: 2.78 mg/dL — ABNORMAL HIGH (ref 0.40–1.20)
GFR: 17.34 mL/min — ABNORMAL LOW (ref >60.00)
Glucose, Bld: 101 mg/dL — ABNORMAL HIGH (ref 70–99)
Potassium: 4.4 mEq/L (ref 3.5–5.1)
Sodium: 138 mEq/L (ref 135–145)

## 2021-04-12 MED ORDER — NA SULFATE-K SULFATE-MG SULF 17.5-3.13-1.6 GM/177ML PO SOLN: 1.0000 | ORAL | 0 refills | Status: DC

## 2021-04-12 NOTE — Progress Notes (Signed)
HPI: This is a very pleasant 65 year old who was referred to me by Gerlene Fee, DO  to evaluate microcytic anemia, family history of colon cancer  I actually met her about 4-1/2 years ago as a new patient.  Her mother died of colon cancer in her mid 23s.  She was on Eliquis for atrial fibrillation.  I recommended colonoscopy for her in 2017.  It looks like she never went through with that.  From you reviewing her referring paperwork it looks like she was sent here to discuss her anemia and her family history of colon cancer.  She also mentions today that she has had some pains in her right lower quadrant for about 3 or 4 weeks.  It is a stabbing pain.  It hurts worse when she pushes on it.  Other than that she has no changes in her bowel habits, no overt GI bleeding.  No significant constipation or diarrhea.  Her weight is down about 20 pounds in the past 6 months since she started doing intermittent fasting to help lose weight.  Her mother died of colon cancer when she was in her 46s  Old Data Reviewed: Blood work June 2022 hemoglobin 9.6, MCV 79, normal platelets creatinine 2.8.  This is apparently around her normal baseline kidney function.  She was told that she was anemic and that she needed a colonoscopy.  Review of systems: Pertinent positive and negative review of systems were noted in the above HPI section. All other review negative.   Past Medical History:  Diagnosis Date   Anemia    Arthritis    Atrial fibrillation ([place])    CAD (coronary artery disease)    Chronic kidney disease, stage III (moderate) (HCC)    Constipation    Diabetes mellitus ([place])    History of blood transfusion    HLD (hyperlipidemia)    Hypertension    Myocardial infarction [facility])    in April 2014    Past Surgical History:  Procedure Laterality Date   ANKLE SURGERY Right    x 6 total   BREAST BIOPSY Left    15 or 20 yearsa ago she cant remember laterality    HARDWARE REMOVAL Right 02/13/2013    Procedure: HARDWARE REMOVAL;  Surgeon: Newt Minion, MD;  Location: [place];  Service: Orthopedics;  Laterality: Right;  Right Ankle Removal Hardware, Debridement, Place Wound VAC   laser surgery for diabetic retinopathy Bilateral    LEFT HEART CATHETERIZATION WITH CORONARY ANGIOGRAM N/A 01/09/2013   Procedure: LEFT HEART CATHETERIZATION WITH CORONARY ANGIOGRAM;  Surgeon: Jolaine Artist, MD;  Location[facility] C[facility] LAB;  Service: Cardiovascular;  Laterality: N/A;   ORIF ANKLE FRACTURE Right 01/12/2013   Procedure: OPEN REDUCTION INTERNAL FIXATION (ORIF) ANKLE FRACTURE;  Surgeon: Newt Minion, MD;  Location: [place];  Service: Orthopedics;  Laterality: Right;   PARTIAL HYSTERECTOMY      Current Outpatient Medications  Medication Sig Dispense Refill   apixaban (ELIQUIS) 2.5 MG TABS tablet Take 1 tablet (2.5 mg total) by mouth 2 (two) times daily. 180 tablet 3   Ascorbic Acid (VITAMIN C PO) Take 1 tablet by mouth daily.     B Complex-Biotin-FA (B COMPLETE) TABS Take 1 tablet by mouth daily.     Blood Glucose Monitoring Suppl (ACCU-CHEK AVIVA PLUS) w/Device KIT CHECK BLOOD GLUCOSE THREE TIMES DAILY 1 kit 0   calcitRIOL (ROCALTROL) 0.25 MCG capsule TAKE 1 CAPSULE EVERY OTHER DAY. 45 capsule 1   carvedilol (COREG) 12.5 MG  tablet Take 1 tablet (12.5 mg total) by mouth 2 (two) times daily with a meal. 180 tablet 3   co-enzyme Q-10 30 MG capsule Take 30 mg by mouth daily.     furosemide (LASIX) 40 MG tablet Take 40 mg by mouth 2 (two) times daily.      glucose blood (ACCU-CHEK AVIVA PLUS) test strip CHECK BLOOD GLUCOSE THREE TIMES DAILY 300 each 11   hydrALAZINE (APRESOLINE) 100 MG tablet      hydrALAZINE (APRESOLINE) 50 MG tablet Take 50 mg by mouth in the morning and at bedtime. Takes 117m in the morning, 528mmidday and 10052mn the evening.     insulin glargine (LANTUS) 100 UNIT/ML injection Inject 0.2 mLs (20 Units total) into the skin daily. (Patient taking differently: Inject 10-15 Units into  the skin daily.) 60 mL 0   insulin regular (NOVOLIN R) 100 units/mL injection Inject 0.05-0.1 mLs (5-10 Units total) into the skin 3 (three) times daily before meals. 60 mL 2   Insulin Syringe-Needle U-100 (DROPLET INSULIN SYRINGE) 31G X 5/16" 0.5 ML MISC USE FOUR TIMES DAILY  FOR  INJECTIONS 400 each 2   Insulin Syringes, Disposable, U-100 0.5 ML MISC 4x daily injections 100 each 8   isosorbide mononitrate (IMDUR) 30 MG 24 hr tablet TAKE 1 TABLET (30 MG TOTAL) BY MOUTH DAILY. 90 tablet 1   Lancet Devices (ACCU-CHEK SOFTCLIX) lancets Fill for 1 month for TID testing. Use as instructed 90 each 0   Multiple Vitamin (MULTIVITAMIN WITH MINERALS) TABS Take 1 tablet by mouth daily.     NEEDLE, DISP, 30 G (BD DISP NEEDLES) 30G X 1/2" MISC For 4x daily injections 100 each 13   VITAMIN D, CHOLECALCIFEROL, PO Take 1 tablet by mouth daily.     No current facility-administered medications for this visit.    Allergies as of 04/12/2021 - Review Complete 04/12/2021  Allergen Reaction Noted   Lisinopril Other (See Comments) 10/09/2011   Peanut-containing drug products Itching and Other (See Comments) 02/12/2013    Family History  Problem Relation Age of Onset   Diabetes Mother        No history CAD   Colon cancer Mother 62 3    died at 66 82om CRCEastoverHypertension Father        Also had CAD   Heart disease Father    Cervical cancer Sister    Diabetes Sister    Congestive Heart Failure Sister    Diabetes Brother    Kidney disease Brother    Heart disease Maternal Grandmother    Sickle cell anemia Son    Thyroid disease Son     Social History   Socioeconomic History   Marital status: Legally Separated    Spouse name: Not on file   Number of children: 4   Years of education: Not on file   Highest education level: Not on file  Occupational History   Occupation: Disabled  Tobacco Use   Smoking status: Former    Types: Cigarettes    Quit date: 08/27/1976    Years since quitting: 44.6    Smokeless tobacco: Never  Substance and Sexual Activity   Alcohol use: No   Drug use: No   Sexual activity: Not on file  Other Topics Concern   Not on file  Social History Narrative   Patient lives with her son..   Social Determinants of Health   Financial Resource Strain: Not on file  Food Insecurity:  Not on file  Transportation Needs: Not on file  Physical Activity: Not on file  Stress: Not on file  Social Connections: Not on file  Intimate Partner Violence: Not on file     Physical Exam: Ht 5' 6" (1.676 m) Comment: height measured without shoes  Wt 224 lb 6 oz (101.8 kg)   BMI 36.22 kg/m  Constitutional: generally well-appearing Psychiatric: alert and oriented x3 Eyes: extraocular movements intact Mouth: oral pharynx moist, no lesions Neck: supple no lymphadenopathy Cardiovascular: heart regular rate and rhythm Lungs: clear to auscultation bilaterally Abdomen: soft, mildly tender right lower quadrant, nondistended, no obvious ascites, no peritoneal signs, normal bowel sounds Extremities: no lower extremity edema bilaterally Skin: no lesions on visible extremities   Assessment and plan: 65 y.o. female with family history of colon cancer, right lower quadrant tenderness, microcytic anemia  She does recall being here in our office for 5 years ago and being advised to have a colonoscopy.  She does not remember why she never went through with that.  She thinks she just decided to not have it done.  Now she has mild anemia, microcytic and some pains in her right lower quadrant.  I recommended CT scan abdomen pelvis with IV and oral contrast to evaluate her right lower quadrant pains.  I recommended repeat CBC today to see if her anemia is progressive and we will arrange colonoscopy to be performed at her soonest convenience.  She is on Eliquis and knows that that will need to be stopped 2 days prior given risk of bleeding during her procedure.  We will contact her primary  care physician to make sure they feel it is okay for her to stop her Eliquis.  Please see the "Patient Instructions" section for addition details about the plan.   Owens Loffler, MD [facility] 04/12/2021, 2:07 PM  Cc: Gerlene Fee, DO  Total time on date of encounter was 45 minutes (this included time spent preparing to see the patient reviewing records; obtaining and/or reviewing separately obtained history; performing a medically appropriate exam and/or evaluation; counseling and educating the patient and family if present; ordering medications, tests or procedures if applicable; and documenting clinical information in the health record).

## 2021-04-12 NOTE — Telephone Encounter (Signed)
[facility] Pre-operative Risk Assessment     Request for surgical clearance:     Endoscopy Procedure  What type of surgery is being performed?     Colonoscopy  When is this surgery scheduled?     05-17-2021  What type of clearance is required ?   Pharmacy  Are there any medications that need to be held prior to surgery and how long? Eliquis x 2 days  Practice name and name of physician performing surgery?      [facility]  What is your office phone and fax number?      Phone- [phone]  [phone]  Anesthesia type (None, local, MAC, general) ?       MAC

## 2021-04-12 NOTE — Addendum Note (Signed)
Addended by: Stevan Born on: 04/12/2021 02:56 PM   Modules accepted: Orders

## 2021-04-12 NOTE — Patient Instructions (Signed)
If you are age 65 or older, your body mass index should be between 23-30. Your Body mass index is 36.22 kg/m. If this is out of the aforementioned range listed, please consider follow up with your Primary Care Provider. _________________________________________________________  The [facility] providers would like to encourage you to use [facility] to communicate with providers for non-urgent requests or questions.  Due to long hold times on the telephone, sending your provider a message by [facility] may be a faster and more efficient way to get a response.  Please allow 48 business hours for a response.  Please remember that this is for non-urgent requests.  _________________________________________________________You have been scheduled for a colonoscopy. Please follow written instructions given to you at your visit today.  Please pick up your prep supplies at the pharmacy within the next 1-3 days. If you use inhalers (even only as needed), please bring them with you on the day of your procedure.  Due to recent changes in healthcare laws, you may see the results of your imaging and laboratory studies on MyChart before your provider has had a chance to review them.  We understand that in some cases there may be results that are confusing or concerning to you. Not all laboratory results come back in the same time frame and the provider may be waiting for multiple results in order to interpret others.  Please give [place] 48 hours in order for your provider to thoroughly review all the results before contacting the office for clarification of your results.   You have been scheduled for a CT scan of the abdomen and pelvis at [facility] ([address]---this is in the same building as [employer]).   You are scheduled on 04-19-2021 at 3:00pm. You should arrive 15 minutes prior to your appointment time for registration. Please follow the written instructions below on the day of your  exam:  WARNING: IF YOU ARE ALLERGIC TO IODINE/X-RAY DYE, PLEASE NOTIFY RADIOLOGY IMMEDIATELY AT [id number]! YOU WILL BE GIVEN A 13 HOUR PREMEDICATION PREP.  1) Do not eat or drink anything after 11am (4 hours prior to your test) 2) You have been given 2 bottles of oral contrast to drink. The solution may taste better if refrigerated, but do NOT add ice or any other liquid to this solution. Shake well before drinking.    Drink 1 bottle of contrast @ 1pm (2 hours prior to your exam)  Drink 1 bottle of contrast @ 2pm (1 hour prior to your exam)  You may take any medications as prescribed with a small amount of water, if necessary. If you take any of the following medications: METFORMIN, GLUCOPHAGE, GLUCOVANCE, AVANDAMET, RIOMET, FORTAMET, [place] MET, JANUMET, GLUMETZA or METAGLIP, you MAY be asked to HOLD this medication 48 hours AFTER the exam.  The purpose of you drinking the oral contrast is to aid in the visualization of your intestinal tract. The contrast solution may cause some diarrhea. Depending on your individual set of symptoms, you may also receive an intravenous injection of x-ray contrast/dye. Plan on being at [facility] for 30 minutes or longer, depending on the type of exam you are having performed.  This test typically takes 30-45 minutes to complete.  If you have any questions regarding your exam or if you need to reschedule, you may call the CT department at [phone] between the hours of 8:00 am and 5:00 pm, Monday-Friday.  Your provider has requested that you go to the basement level  for lab work before leaving today. Press "B" on the elevator. The lab is located at the first door on the left as you exit the elevator.  Thank you for entrusting me with your care and choosing [facility].  Dr Ardis Hughs

## 2021-04-14 NOTE — Telephone Encounter (Signed)
   Name: [name]  DOB: Feb 18, 1956  MRN: [id number]  Primary Cardiologist: None  Chart reviewed as part of pre-operative protocol coverage for colonoscopy 05/17/21.   Pt last seen in office in 2016.   Because of [name]'s past medical history and time since last visit, she will require a follow-up visit in order to better assess preoperative cardiovascular risk.  Pre-op covering staff: - Please schedule appointment and call patient to inform them. Please add "pre-op clearance" to the appointment notes so provider is aware. She may have a different cardiologist, since it has been forever? - Please contact requesting surgeon's office via preferred method (i.e, phone, fax) to inform them of need for appointment prior to surgery.   Arvil Chaco, PA-C  04/14/2021, 4:16 PM

## 2021-04-14 NOTE — Telephone Encounter (Signed)
1st attempt to reach pt regarding needing an appointment before surgical clearance can be given.  Left a detailed message to call back schedule.

## 2021-04-17 ENCOUNTER — Telehealth: Payer: Self-pay

## 2021-04-17 ENCOUNTER — Telehealth: Payer: Self-pay | Admitting: Gastroenterology

## 2021-04-17 NOTE — Telephone Encounter (Signed)
Dr Henrene Pastor,  I received this message from [place] at CT:  Hi [name]--this patients Creatinine is very high and her GFR is to low for [place] to give her IV contrast. I was going to message Dr. Ardis Hughs directly but it looks like he is on vacation. Can I just change it to Oral contrast only or do you need to run it by whoever is covering for him?  Please advise as you are DOD for the afternoon.  Thank you, Elmyra Ricks

## 2021-04-17 NOTE — Telephone Encounter (Signed)
S/w pt and she is agreeable to the need for a pre op appt for clearance. Pt will also be a new pt to the practice as we have not seen the pt since 2016. I have scheduled the pt to see Dr. Margaretann Loveless at the [place] office 04/20/21. I will send a message to our chart prep team to be sure that we have all the needed information for the appt 04/20/21. I will forward the MD the clearance notes for appt. I will send FYI to requesting office pt has a New pt appt 04/20/21.

## 2021-04-17 NOTE — Telephone Encounter (Signed)
Patient calling wants to know when she can pick the  prep up for the procedure on 05/17/21.. Plz advise..  thanks

## 2021-04-17 NOTE — Telephone Encounter (Signed)
[place] notified by secure chat of Dr Blanch Media recommendations.

## 2021-04-17 NOTE — Telephone Encounter (Signed)
Limitations noted.  Proceed with oral contrast only CT.  When the results return, Dr. Ardis Hughs can decide whether the study was adequate (or if he wishes to proceed with an additional study, such as MRI)

## 2021-04-18 NOTE — Telephone Encounter (Signed)
Patient advised that she can pick up Suprep at [facility] on [address].  Patient verbalized understanding.  No further questions.

## 2021-04-19 ENCOUNTER — Ambulatory Visit (INDEPENDENT_AMBULATORY_CARE_PROVIDER_SITE_OTHER)
Admission: RE | Admit: 2021-04-19 | Discharge: 2021-04-19 | Disposition: A | Discharge status: Home / Self Care | Payer: Medicare HMO | Source: Ambulatory Visit | Attending: Gastroenterology | Admitting: Gastroenterology

## 2021-04-19 ENCOUNTER — Other Ambulatory Visit: Payer: Self-pay

## 2021-04-19 DIAGNOSIS — R1031 Right lower quadrant pain: Secondary | ICD-10-CM

## 2021-04-19 DIAGNOSIS — K8020 Calculus of gallbladder without cholecystitis without obstruction: Secondary | ICD-10-CM | POA: Diagnosis not present

## 2021-04-19 DIAGNOSIS — N8189 Other female genital prolapse: Secondary | ICD-10-CM | POA: Diagnosis not present

## 2021-04-19 DIAGNOSIS — I700 Atherosclerosis of aorta: Secondary | ICD-10-CM | POA: Diagnosis not present

## 2021-04-19 DIAGNOSIS — N3289 Other specified disorders of bladder: Secondary | ICD-10-CM | POA: Diagnosis not present

## 2021-04-20 ENCOUNTER — Encounter: Payer: Self-pay | Admitting: Internal Medicine

## 2021-04-20 ENCOUNTER — Ambulatory Visit: Payer: Medicare HMO | Admitting: Internal Medicine

## 2021-04-20 VITALS — BP 140/80 | HR 69 | Ht 66.0 in | Wt 223.0 lb

## 2021-04-20 DIAGNOSIS — N184 Chronic kidney disease, stage 4 (severe): Secondary | ICD-10-CM

## 2021-04-20 DIAGNOSIS — I480 Paroxysmal atrial fibrillation: Secondary | ICD-10-CM | POA: Diagnosis not present

## 2021-04-20 DIAGNOSIS — E785 Hyperlipidemia, unspecified: Secondary | ICD-10-CM | POA: Diagnosis not present

## 2021-04-20 DIAGNOSIS — Z7901 Long term (current) use of anticoagulants: Secondary | ICD-10-CM | POA: Diagnosis not present

## 2021-04-20 DIAGNOSIS — I10 Essential (primary) hypertension: Secondary | ICD-10-CM

## 2021-04-20 DIAGNOSIS — I25118 Atherosclerotic heart disease of native coronary artery with other forms of angina pectoris: Secondary | ICD-10-CM

## 2021-04-20 DIAGNOSIS — E1169 Type 2 diabetes mellitus with other specified complication: Secondary | ICD-10-CM

## 2021-04-20 DIAGNOSIS — Z01810 Encounter for preprocedural cardiovascular examination: Secondary | ICD-10-CM

## 2021-04-20 DIAGNOSIS — Z794 Long term (current) use of insulin: Secondary | ICD-10-CM

## 2021-04-20 DIAGNOSIS — D6869 Other thrombophilia: Secondary | ICD-10-CM

## 2021-04-20 DIAGNOSIS — I252 Old myocardial infarction: Secondary | ICD-10-CM | POA: Diagnosis not present

## 2021-04-20 DIAGNOSIS — R079 Chest pain, unspecified: Secondary | ICD-10-CM | POA: Diagnosis not present

## 2021-04-20 NOTE — Patient Instructions (Signed)
Medication Instructions:  No Changes In Medications at this time.  *If you need a refill on your cardiac medications before your next appointment, please call your pharmacy*  Testing/Procedures: Your physician has requested that you have a lexiscan myoview. For further information please visit [email or web address]. Please follow instruction sheet, as given. This will take place at [address]  Follow-Up: At [facility], you and your health needs are our priority.  As part of our continuing mission to provide you with exceptional heart care, we have created designated Provider Care Teams.  These Care Teams include your primary Cardiologist (physician) and Advanced Practice Providers (APPs -  Physician Assistants and Nurse Practitioners) who all work together to provide you with the care you need, when you need it.    Your next appointment:   3-4 month(s)  The format for your next appointment:   In Person  Provider:   Cherlynn Kaiser, MD

## 2021-04-20 NOTE — Progress Notes (Signed)
Cardiology Office Note:    Date:  04/20/2021   ID:  [name], DOB December 15, 1955, MRN [id number]  PCP:  Gerlene Fee, DO  Cardiologist:  None  Electrophysiologist:  None   Referring MD: Gerlene Fee, DO   Chief Complaint/Reason for Referral: Preoperative risk stratification.  History of Present Illness:    [name] is a 65 y.o. female with a history of afib, anemia, CAD, diabetes mellitus, CKD, NSTEMI hyperlipidemia and hypertension. Preoperative risk stratification requested for colonoscopy and anticoagulation guidance.   She has a history of paroxysmal A-fib. Per Dr. Stanford Breed in 2016 "Patient was admitted in April of 2014 following a syncopal episode. She fractured her fibula which required surgical intervention. She was noted to be in atrial fibrillation with a rapid ventricular response at the time of admission. She converted spontaneously to sinus rhythm. She also ruled in for a non-ST elevation myocardial infarction. Cardiac catheterization in April of 2014 revealed normal left main, 40% mid LAD and 30% distal. 80% proximal circumflex and 30% right coronary artery. It was felt medical therapy indicated unless she had recurrent symptoms of chest pain and PCI could be considered at that time. Echocardiogram in April of 2014 showed normal LV function, mild left ventricular hypertrophy and mild left atrial enlargement. Carotid Dopplers in April of 2014 showed no significant stenosis."  She continues on Eliquis at reduced dose due to renal dysfunction. No known bleeding but per GI notes, she has mild macrocytic anemia and requires colonscopy to exclude bleeding source. Family history of colon cancer. She thinks she last had A-fib in June and reports that she always feels "different in her chest" when she is experiencing A-fib. Seems to occur less than once a month. At times accompanied by chest pain, substernal and during afib. She reports rarely getting palpitations  which are resolved after she sits down for a while. She gets them about once a month.  She reports she is not very physically active but she works in her garden. She is limited by knee pain primarily when she climbs up the stairs so she has to take a break after climbing up 6 steps. She also experiences some chest discomfort and pain when participating in some activities, but this is not a limiting symptom.  She denies shortness of breath, lightheadedness, headaches, recent syncope, orthopnea, PND.   Past Medical History:  Diagnosis Date   Anemia    Arthritis    Atrial fibrillation ([place])    CAD (coronary artery disease)    Chronic kidney disease, stage III (moderate) (HCC)    Constipation    Diabetes mellitus ([place])    History of blood transfusion    HLD (hyperlipidemia)    Hypertension    Myocardial infarction [facility])    in April 2014    Past Surgical History:  Procedure Laterality Date   ANKLE SURGERY Right    x 6 total   BREAST BIOPSY Left    15 or 20 yearsa ago she cant remember laterality    HARDWARE REMOVAL Right 02/13/2013   Procedure: HARDWARE REMOVAL;  Surgeon: Newt Minion, MD;  Location: [facility];  Service: Orthopedics;  Laterality: Right;  Right Ankle Removal Hardware, Debridement, Place Wound VAC   laser surgery for diabetic retinopathy Bilateral    LEFT HEART CATHETERIZATION WITH CORONARY ANGIOGRAM N/A 01/09/2013   Procedure: LEFT HEART CATHETERIZATION WITH CORONARY ANGIOGRAM;  Surgeon: Jolaine Artist, MD;  Location: [facility] CATH LAB;  Service: Cardiovascular;  Laterality: N/A;  ORIF ANKLE FRACTURE Right 01/12/2013   Procedure: OPEN REDUCTION INTERNAL FIXATION (ORIF) ANKLE FRACTURE;  Surgeon: Newt Minion, MD;  Location: [facility];  Service: Orthopedics;  Laterality: Right;   PARTIAL HYSTERECTOMY      Current Medications: Current Meds  Medication Sig   apixaban (ELIQUIS) 2.5 MG TABS tablet Take 1 tablet (2.5 mg total) by mouth 2 (two) times daily.   Ascorbic Acid  (VITAMIN C PO) Take 1 tablet by mouth daily.   B Complex-Biotin-FA (B COMPLETE) TABS Take 1 tablet by mouth daily.   Blood Glucose Monitoring Suppl (ACCU-CHEK AVIVA PLUS) w/Device KIT CHECK BLOOD GLUCOSE THREE TIMES DAILY   calcitRIOL (ROCALTROL) 0.25 MCG capsule TAKE 1 CAPSULE EVERY OTHER DAY.   carvedilol (COREG) 12.5 MG tablet Take 1 tablet (12.5 mg total) by mouth 2 (two) times daily with a meal.   co-enzyme Q-10 30 MG capsule Take 30 mg by mouth daily.   furosemide (LASIX) 40 MG tablet Take 40 mg by mouth 2 (two) times daily.    glucose blood (ACCU-CHEK AVIVA PLUS) test strip CHECK BLOOD GLUCOSE THREE TIMES DAILY   hydrALAZINE (APRESOLINE) 100 MG tablet    hydrALAZINE (APRESOLINE) 50 MG tablet Take 50 mg by mouth in the morning and at bedtime. Takes 133m in the morning, 577mmidday and 10044mn the evening.   insulin glargine (LANTUS) 100 UNIT/ML injection Inject 0.2 mLs (20 Units total) into the skin daily. (Patient taking differently: Inject 10-15 Units into the skin daily.)   insulin regular (NOVOLIN R) 100 units/mL injection Inject 0.05-0.1 mLs (5-10 Units total) into the skin 3 (three) times daily before meals.   Insulin Syringe-Needle U-100 (DROPLET INSULIN SYRINGE) 31G X 5/16" 0.5 ML MISC USE FOUR TIMES DAILY  FOR  INJECTIONS   Insulin Syringes, Disposable, U-100 0.5 ML MISC 4x daily injections   isosorbide mononitrate (IMDUR) 30 MG 24 hr tablet TAKE 1 TABLET (30 MG TOTAL) BY MOUTH DAILY.   Lancet Devices (ACCU-CHEK SOFTCLIX) lancets Fill for 1 month for TID testing. Use as instructed   Multiple Vitamin (MULTIVITAMIN WITH MINERALS) TABS Take 1 tablet by mouth daily.   Na Sulfate-K Sulfate-Mg Sulf (SUPREP BOWEL PREP KIT) 17.5-3.13-1.6 GM/177ML SOLN Take 1 kit by mouth as directed.   NEEDLE, DISP, 30 G (BD DISP NEEDLES) 30G X 1/2" MISC For 4x daily injections   VITAMIN D, CHOLECALCIFEROL, PO Take 1 tablet by mouth daily.     Allergies:   Lisinopril and Peanut-containing drug products    Social History   Tobacco Use   Smoking status: Never   Smokeless tobacco: Never  Vaping Use   Vaping Use: Never used  Substance Use Topics   Alcohol use: No   Drug use: No     Family History: The patient's family history includes Cervical cancer in her sister; Colon cancer (age of onset: 62)17n her mother; Congestive Heart Failure in her sister; Diabetes in her brother, mother, and sister; Heart disease in her father and maternal grandmother; Hypertension in her father; Kidney disease in her brother; Sickle cell anemia in her son; Thyroid disease in her son.  ROS:   Please see the history of present illness.    (+) knee pain (+)palpitations (+)arthritis (+)chest discomfort and pain All other systems reviewed and are negative.  EKGs/Labs/Other Studies Reviewed:    The following studies were reviewed today:  EKG:    04/20/2021: NSR left axis deviation   Recent Labs: 04/12/2021: BUN 37; Creatinine, Ser 2.78; Hemoglobin 9.2; Platelets  411.0; Potassium 4.4; Sodium 138  Recent Lipid Panel    Component Value Date/Time   CHOL 134 12/21/2019 1802   TRIG 82 12/21/2019 1802   HDL 49 12/21/2019 1802   CHOLHDL 2.7 12/21/2019 1802   CHOLHDL 3.3 07/13/2014 1520   VLDL 17 07/13/2014 1520   LDLCALC 69 12/21/2019 1802    Physical Exam:    VS:  BP 140/80 (BP Location: Left Arm, Patient Position: Sitting, Cuff Size: Large)   Pulse 69   Ht 5' 6"  (1.676 m)   Wt 223 lb (101.2 kg)   BMI 35.99 kg/m     Wt Readings from Last 5 Encounters:  04/20/21 223 lb (101.2 kg)  04/12/21 224 lb 6 oz (101.8 kg)  02/28/21 221 lb 9.6 oz (100.5 kg)  06/16/20 233 lb 9.6 oz (106 kg)  02/24/20 244 lb (110.7 kg)    Constitutional: No acute distress Eyes: sclera non-icteric, normal conjunctiva and lids ENMT: normal dentition, moist mucous membranes Cardiovascular: regular rhythm, normal rate, no murmurs. S1 and S2 normal. Radial pulses normal bilaterally. No jugular venous distention.   Respiratory: clear to auscultation bilaterally GI : normal bowel sounds, soft and nontender. No distention.   MSK: extremities warm, well perfused. Trace left LE edema  and 1+ right LE edema  NEURO: grossly nonfocal exam, moves all extremities. PSYCH: alert and oriented x 3, normal mood and affect.   ASSESSMENT:    1. Preoperative cardiovascular examination   2. Chest pain, unspecified type   3. Coronary artery disease of native artery of native heart with stable angina pectoris ([place])   4. H/O non-ST elevation myocardial infarction (NSTEMI)   5. Paroxysmal atrial fibrillation (HCC)   6. Secondary hypercoagulable state ([place])   7. Long term (current) use of anticoagulants   8. Hyperlipidemia, unspecified hyperlipidemia type   9. Primary hypertension   10. Type 2 diabetes mellitus with other specified complication, with long-term current use of insulin ([place])   11. Chronic kidney disease (CKD), stage IV (severe) (HCC)    PLAN:    Preoperative cardiovascular examination Chest pain, unspecified type - Plan: EKG 12-Lead, MYOCARDIAL PERFUSION IMAGING Coronary artery disease of native artery of native heart with stable angina pectoris (HCC) H/O non-ST elevation myocardial infarction (NSTEMI) - She describes chest pain with her usual activities and when she feels she is in atrial fibrillation.  She does have a residual severe stenosis in the proximal left circumflex artery previously described on cath is 80%.  This was medically managed.  Given chest pain and request for preoperative risk stratification, we will proceed with stress perfusion imaging to risk stratify her symptoms.  She is currently being evaluated for anemia and therefore decision to cath may depend on status of possible GI bleeding and further work-up of anemia prior to committing the patient to dual antiplatelet therapy.  For now she continues on low-dose Eliquis.  We will plan for Lexiscan Myoview stress test and provide  preoperative risk comments afterward.  Paroxysmal atrial fibrillation (HCC) Secondary hypercoagulable state ([place]) Long term (current) use of anticoagulants -Overall does not have bothersome palpitations and continues on Eliquis 2.5 mg twice daily, previously decided to be on a reduced dose due to renal dysfunction.  Most recent creatinine 2.78 approximately 1 week ago.  Aside from renal function she meets criteria for full dose anticoagulation, however in the setting of bleeding we will not make changes today since this has been a stable dose of Eliquis for some time now.  May  need to revisit after GI work-up. -Takes carvedilol 12.5 mg twice daily and has not had concerning RVR recently.  Hyperlipidemia, unspecified hyperlipidemia type -Most recent lipid panel shows an LDL of 69 and triglycerides of 82 HDL of 49.  This is from a lipid panel last year.  I do not see that she is currently on statin therapy.  With known coronary artery disease we will need to revisit this at follow-up.  Primary hypertension-borderline elevated blood pressure above goal today.  - continue on carvedilol 12.5 mg BId, Lasix 40 mg twice daily, hydralazine 2-3 times a day, Imdur 30 mg daily.  We will titrate blood pressure control further at follow-up. BP Readings from Last 3 Encounters:  04/20/21 140/80  04/12/21 (!) 144/80  02/28/21 140/80      Total time of encounter: 60 minutes total time of encounter, including 30 minutes spent in face-to-face patient care on the date of this encounter. This time includes coordination of care and counseling regarding above mentioned problem list. Remainder of non-face-to-face time involved reviewing chart documents/testing relevant to the patient encounter and documentation in the medical record. I have independently reviewed documentation from referring provider.   Follow-up in 3-4 months  Cherlynn Kaiser, MD, [facility]   Shared Decision  Making/Informed Consent:   Shared Decision Making/Informed Consent The risks [chest pain, shortness of breath, cardiac arrhythmias, dizziness, blood pressure fluctuations, myocardial infarction, stroke/transient ischemic attack, nausea, vomiting, allergic reaction, radiation exposure, metallic taste sensation and life-threatening complications (estimated to be 1 in 10,000)], benefits (risk stratification, diagnosing coronary artery disease, treatment guidance) and alternatives of a nuclear stress test were discussed in detail with [name] and she agrees to proceed.   Medication Adjustments/Labs and Tests Ordered: Current medicines are reviewed at length with the patient today.  Concerns regarding medicines are outlined above.   Orders Placed This Encounter  Procedures   MYOCARDIAL PERFUSION IMAGING   EKG 12-Lead    No orders of the defined types were placed in this encounter.   Patient Instructions  Medication Instructions:  No Changes In Medications at this time.  *If you need a refill on your cardiac medications before your next appointment, please call your pharmacy*  Testing/Procedures: Your physician has requested that you have a lexiscan myoview. For further information please visit [email or web address]. Please follow instruction sheet, as given. This will take place at [address]  Follow-Up: At [facility], you and your health needs are our priority.  As part of our continuing mission to provide you with exceptional heart care, we have created designated Provider Care Teams.  These Care Teams include your primary Cardiologist (physician) and Advanced Practice Providers (APPs -  Physician Assistants and Nurse Practitioners) who all work together to provide you with the care you need, when you need it.    Your next appointment:   3-4 month(s)  The format for your next appointment:   In Person  Provider:   Cherlynn Kaiser, MD    I,Zite Okoli,acting  as a scribe for Elouise Munroe, MD.,have documented all relevant documentation on the behalf of Elouise Munroe, MD,as directed by  Elouise Munroe, MD while in the presence of Elouise Munroe, MD.   I, Elouise Munroe, MD, have reviewed all documentation for this visit. The documentation on 04/20/21 for the exam, diagnosis, procedures, and orders are all accurate and complete.

## 2021-04-27 ENCOUNTER — Telehealth: Payer: Self-pay

## 2021-04-27 NOTE — Telephone Encounter (Signed)
Spoke with the patient, detailed instructions left with the patient. She stated that she would be here for her test. Asked to call back with any questions. S.Monie Shere EMTP 

## 2021-05-01 ENCOUNTER — Other Ambulatory Visit: Payer: Self-pay

## 2021-05-01 DIAGNOSIS — R1030 Lower abdominal pain, unspecified: Secondary | ICD-10-CM

## 2021-05-02 ENCOUNTER — Telehealth: Payer: Self-pay | Admitting: Internal Medicine

## 2021-05-02 ENCOUNTER — Ambulatory Visit (HOSPITAL_COMMUNITY): Payer: Medicare HMO | Attending: Cardiology

## 2021-05-02 ENCOUNTER — Other Ambulatory Visit: Payer: Self-pay

## 2021-05-02 DIAGNOSIS — R079 Chest pain, unspecified: Secondary | ICD-10-CM | POA: Insufficient documentation

## 2021-05-02 LAB — MYOCARDIAL PERFUSION IMAGING
LV dias vol: 91 mL (ref 46–106)
LV sys vol: 38 mL
Peak HR: 71 {beats}/min
Rest HR: 62 {beats}/min
SDS: 0
SRS: 0
SSS: 0
TID: 0.94

## 2021-05-02 MED ORDER — TECHNETIUM TC 99M TETROFOSMIN IV KIT
11.0000 | PACK | Freq: Once | INTRAVENOUS | Status: AC | PRN
  Administered 2021-05-02: 11 via INTRAVENOUS
  Filled 2021-05-02: qty 11

## 2021-05-02 MED ORDER — TECHNETIUM TC 99M TETROFOSMIN IV KIT
32.4000 | PACK | Freq: Once | INTRAVENOUS | Status: AC | PRN
  Administered 2021-05-02: 32.4 via INTRAVENOUS
  Filled 2021-05-02: qty 33

## 2021-05-02 MED ORDER — REGADENOSON 0.4 MG/5ML IV SOLN
0.4000 mg | Freq: Once | INTRAVENOUS | Status: AC
Start: 2021-05-02 — End: 2021-05-02
  Administered 2021-05-02: 0.4 mg via INTRAVENOUS

## 2021-05-02 NOTE — Telephone Encounter (Signed)
pt states that her sugar is dropping and she is not supposed to eat anything.. please advise

## 2021-05-02 NOTE — Telephone Encounter (Signed)
PT was able to speak w/ a nurse please disregard

## 2021-05-05 ENCOUNTER — Other Ambulatory Visit (INDEPENDENT_AMBULATORY_CARE_PROVIDER_SITE_OTHER): Payer: Medicare HMO

## 2021-05-05 DIAGNOSIS — R1030 Lower abdominal pain, unspecified: Secondary | ICD-10-CM

## 2021-05-05 LAB — URINALYSIS, ROUTINE W REFLEX MICROSCOPIC
Bilirubin Urine: NEGATIVE
Hgb urine dipstick: NEGATIVE
Ketones, ur: NEGATIVE
Nitrite: POSITIVE — AB
Specific Gravity, Urine: 1.01 (ref 1.000–1.030)
Total Protein, Urine: 100 — AB
Urine Glucose: NEGATIVE
Urobilinogen, UA: 0.2 (ref 0.0–1.0)
pH: 6 (ref 5.0–8.0)

## 2021-05-07 LAB — URINE CULTURE
MICRO NUMBER:: 12236443
SPECIMEN QUALITY:: ADEQUATE

## 2021-05-09 ENCOUNTER — Other Ambulatory Visit: Payer: Self-pay

## 2021-05-09 MED ORDER — CIPROFLOXACIN HCL 500 MG PO TABS: 500.0000 mg | ORAL_TABLET | Freq: Every day | ORAL | 0 refills | Status: AC

## 2021-05-15 ENCOUNTER — Other Ambulatory Visit: Payer: Self-pay | Admitting: Family Medicine

## 2021-05-17 ENCOUNTER — Encounter: Payer: Medicare HMO | Admitting: Gastroenterology

## 2021-05-19 ENCOUNTER — Other Ambulatory Visit: Payer: Self-pay

## 2021-05-19 ENCOUNTER — Encounter: Payer: Self-pay | Admitting: Gastroenterology

## 2021-05-19 ENCOUNTER — Ambulatory Visit (AMBULATORY_SURGERY_CENTER): Payer: Medicare HMO | Admitting: Gastroenterology

## 2021-05-19 VITALS — BP 162/66 | HR 65 | Temp 97.8°F | Resp 16 | Ht 66.0 in | Wt 224.0 lb

## 2021-05-19 DIAGNOSIS — I10 Essential (primary) hypertension: Secondary | ICD-10-CM | POA: Diagnosis not present

## 2021-05-19 DIAGNOSIS — N184 Chronic kidney disease, stage 4 (severe): Secondary | ICD-10-CM | POA: Diagnosis not present

## 2021-05-19 DIAGNOSIS — D122 Benign neoplasm of ascending colon: Secondary | ICD-10-CM | POA: Diagnosis not present

## 2021-05-19 DIAGNOSIS — K5730 Diverticulosis of large intestine without perforation or abscess without bleeding: Secondary | ICD-10-CM | POA: Diagnosis not present

## 2021-05-19 DIAGNOSIS — D123 Benign neoplasm of transverse colon: Secondary | ICD-10-CM | POA: Diagnosis not present

## 2021-05-19 DIAGNOSIS — Z800 Family history of malignant neoplasm of digestive organs: Secondary | ICD-10-CM | POA: Diagnosis not present

## 2021-05-19 DIAGNOSIS — I2510 Atherosclerotic heart disease of native coronary artery without angina pectoris: Secondary | ICD-10-CM | POA: Diagnosis not present

## 2021-05-19 DIAGNOSIS — D509 Iron deficiency anemia, unspecified: Secondary | ICD-10-CM

## 2021-05-19 DIAGNOSIS — R1031 Right lower quadrant pain: Secondary | ICD-10-CM

## 2021-05-19 DIAGNOSIS — E119 Type 2 diabetes mellitus without complications: Secondary | ICD-10-CM | POA: Diagnosis not present

## 2021-05-19 MED ORDER — SODIUM CHLORIDE 0.9 % IV SOLN: 500.0000 mL | Freq: Once | INTRAVENOUS | Status: DC

## 2021-05-19 NOTE — Op Note (Signed)
[facility] Patient Name: [name] Procedure Date: 05/19/2021 2:32 PM MRN: [id number] Endoscopist: Milus Banister , MD Age: 65 Referring MD:  Date of Birth: 03-18-1956 Gender: Female Account #: [id number] Procedure:                Colonoscopy Indications:              Chronic IDA, FH of CRC (mother) Medicines:                Monitored Anesthesia Care Procedure:                Pre-Anesthesia Assessment:                           - Prior to the procedure, a History and Physical                            was performed, and patient medications and                            allergies were reviewed. The patient's tolerance of                            previous anesthesia was also reviewed. The risks                            and benefits of the procedure and the sedation                            options and risks were discussed with the patient.                            All questions were answered, and informed consent                            was obtained. Prior Anticoagulants: The patient has                            taken Eliquis (apixaban), last dose was 3 days                            prior to procedure. ASA Grade Assessment: III - A                            patient with severe systemic disease. After                            reviewing the risks and benefits, the patient was                            deemed in satisfactory condition to undergo the                            procedure.  After obtaining informed consent, the colonoscope                            was passed under direct vision. Throughout the                            procedure, the patient's blood pressure, pulse, and                            oxygen saturations were monitored continuously. The                            CF [id number] #[id number] was introduced through the anus                            and advanced to the the cecum, identified by                             appendiceal orifice and ileocecal valve. The                            colonoscopy was performed without difficulty. The                            patient tolerated the procedure well. The quality                            of the bowel preparation was good. The ileocecal                            valve, appendiceal orifice, and rectum were                            photographed. Scope In: 2:37:42 PM Scope Out: 3:00:49 PM Scope Withdrawal Time: 0 hours 17 minutes 56 seconds  Total Procedure Duration: 0 hours 23 minutes 7 seconds  Findings:                 Two sessile polyps were found in the transverse                            colon. The polyps were 3 to 4 mm in size. These                            polyps were removed with a cold snare. Resection                            and retrieval were complete. jar 2                           There were two polyps in the proximal ascending                            colon. The smaller of the two  was 20mm, somewhat                            firm, located nearly adjactent to the IC valve.                            Biopsies taken. jar 1. The larger of the two                            ascending polyps was soft, heaped up, approximately                            3cm across, located 5cm distal to the IC valve.                            This was also biopsied. jar 3. See images.                           Multiple small and large-mouthed diverticula were                            found in the left colon.                           The exam was otherwise without abnormality on                            direct and retroflexion views. Complications:            No immediate complications. Estimated blood loss:                            Minimal. Estimated Blood Loss:     Estimated blood loss: none. Impression:               - Two 3 to 4 mm polyps in the transverse colon,                            removed with a cold snare. Resected  and retrieved.                            jar 2                           - There were two polyps in the proximal ascending                            colon. The smaller of the two was 61mm, somewhat                            firm, located nearly adjactent to the IC valve.                            Biopsies taken. jar 1. The larger of the two  ascending polyps was soft, heaped up, approximately                            3cm across, located 5cm distal to the IC valve.                            This was also biopsied. jar 3. See images.                           - Diverticulosis in the left colon.                           - The examination was otherwise normal on direct                            and retroflexion views. Recommendation:           - Patient has a contact number available for                            emergencies. The signs and symptoms of potential                            delayed complications were discussed with the                            patient. Return to normal activities tomorrow.                            Written discharge instructions were provided to the                            patient.                           - Resume previous diet.                           - Continue present medications. It is safe to                            resume your blood thinner tomorrow.                           - Await pathology results. Milus Banister, MD 05/19/2021 3:07:26 PM This report has been signed electronically.

## 2021-05-19 NOTE — Patient Instructions (Signed)
YOU HAD AN ENDOSCOPIC PROCEDURE TODAY AT [facility]:   Refer to the procedure report that was given to you for any specific questions about what was found during the examination.  If the procedure report does not answer your questions, please call your gastroenterologist to clarify.  If you requested that your care partner not be given the details of your procedure findings, then the procedure report has been included in a sealed envelope for you to review at your convenience later.  **Handouts given on Polyps and Diverticulosis**  YOU SHOULD EXPECT: Some feelings of bloating in the abdomen. Passage of more gas than usual.  Walking can help get rid of the air that was put into your GI tract during the procedure and reduce the bloating. If you had a lower endoscopy (such as a colonoscopy or flexible sigmoidoscopy) you may notice spotting of blood in your stool or on the toilet paper. If you underwent a bowel prep for your procedure, you may not have a normal bowel movement for a few days.  Please Note:  You might notice some irritation and congestion in your nose or some drainage.  This is from the oxygen used during your procedure.  There is no need for concern and it should clear up in a day or so.  SYMPTOMS TO REPORT IMMEDIATELY:  Following lower endoscopy (colonoscopy or flexible sigmoidoscopy):  Excessive amounts of blood in the stool  Significant tenderness or worsening of abdominal pains  Swelling of the abdomen that is new, acute  Fever of 100F or higher  For urgent or emergent issues, a gastroenterologist can be reached at any hour by calling [phone]. Do not use MyChart messaging for urgent concerns.    DIET:  We do recommend a small meal at first, but then you may proceed to your regular diet.  Drink plenty of fluids but you should avoid alcoholic beverages for 24 hours.  ACTIVITY:  You should plan to take it easy for the rest of today and you should NOT DRIVE  or use heavy machinery until tomorrow (because of the sedation medicines used during the test).    FOLLOW UP: Our staff will call the number listed on your records 48-72 hours following your procedure to check on you and address any questions or concerns that you may have regarding the information given to you following your procedure. If we do not reach you, we will leave a message.  We will attempt to reach you two times.  During this call, we will ask if you have developed any symptoms of COVID 19. If you develop any symptoms (ie: fever, flu-like symptoms, shortness of breath, cough etc.) before then, please call [phone].  If you test positive for Covid 19 in the 2 weeks post procedure, please call and report this information to [place].    If any biopsies were taken you will be contacted by phone or by letter within the next 1-3 weeks.  Please call us at [phone] if you have not heard about the biopsies in 3 weeks.    SIGNATURES/CONFIDENTIALITY: You and/or your care partner have signed paperwork which will be entered into your electronic medical record.  These signatures attest to the fact that that the information above on your After Visit Summary has been reviewed and is understood.  Full responsibility of the confidentiality of this discharge information lies with you and/or your care-partner.

## 2021-05-19 NOTE — Progress Notes (Signed)
Called to room to assist during endoscopic procedure.  Patient ID and intended procedure confirmed with present staff. Received instructions for my participation in the procedure from the performing physician.  

## 2021-05-19 NOTE — Progress Notes (Signed)
Report to PACU, RN, vss, BBS= Clear.  

## 2021-05-19 NOTE — Progress Notes (Signed)
HPI: This is a womaqn with microcytic anemia, fh of CRC, lower abd pains   ROS: complete GI ROS as described in HPI, all other review negative.  Constitutional:  No unintentional weight loss   Past Medical History:  Diagnosis Date   Anemia    Arthritis    Atrial fibrillation ([place])    CAD (coronary artery disease)    Chronic kidney disease, stage III (moderate) (HCC)    Constipation    Diabetes mellitus ([place])    History of blood transfusion    HLD (hyperlipidemia)    Hypertension    Myocardial infarction [facility])    in April 2014    Past Surgical History:  Procedure Laterality Date   ANKLE SURGERY Right    x 6 total   BREAST BIOPSY Left    15 or 20 yearsa ago she cant remember laterality    HARDWARE REMOVAL Right 02/13/2013   Procedure: HARDWARE REMOVAL;  Surgeon: Newt Minion, MD;  Location: [place];  Service: Orthopedics;  Laterality: Right;  Right Ankle Removal Hardware, Debridement, Place Wound VAC   laser surgery for diabetic retinopathy Bilateral    LEFT HEART CATHETERIZATION WITH CORONARY ANGIOGRAM N/A 01/09/2013   Procedure: LEFT HEART CATHETERIZATION WITH CORONARY ANGIOGRAM;  Surgeon: Jolaine Artist, MD;  Location: [facility] CATH LAB;  Service: Cardiovascular;  Laterality: N/A;   ORIF ANKLE FRACTURE Right 01/12/2013   Procedure: OPEN REDUCTION INTERNAL FIXATION (ORIF) ANKLE FRACTURE;  Surgeon: Newt Minion, MD;  Location: [place];  Service: Orthopedics;  Laterality: Right;   PARTIAL HYSTERECTOMY      Current Outpatient Medications  Medication Sig Dispense Refill   Ascorbic Acid (VITAMIN C PO) Take 1 tablet by mouth daily.     Blood Glucose Monitoring Suppl (ACCU-CHEK AVIVA PLUS) w/Device KIT CHECK BLOOD GLUCOSE THREE TIMES DAILY 1 kit 0   calcitRIOL (ROCALTROL) 0.25 MCG capsule TAKE 1 CAPSULE EVERY OTHER DAY. 45 capsule 1   carvedilol (COREG) 12.5 MG tablet Take 1 tablet (12.5 mg total) by mouth 2 (two) times daily with a meal. 180 tablet 3   glucose blood (ACCU-CHEK  AVIVA PLUS) test strip CHECK BLOOD GLUCOSE THREE TIMES DAILY 300 each 11   hydrALAZINE (APRESOLINE) 100 MG tablet      hydrALAZINE (APRESOLINE) 50 MG tablet Take 50 mg by mouth in the morning and at bedtime. Takes 166m in the morning, 56mmidday and 10028mn the evening.     insulin glargine (LANTUS) 100 UNIT/ML injection Inject 0.2 mLs (20 Units total) into the skin daily. (Patient taking differently: Inject 10-15 Units into the skin daily.) 60 mL 0   insulin regular (NOVOLIN R) 100 units/mL injection Inject 0.05-0.1 mLs (5-10 Units total) into the skin 3 (three) times daily before meals. 60 mL 2   Insulin Syringe-Needle U-100 (DROPLET INSULIN SYRINGE) 31G X 5/16" 0.5 ML MISC USE FOUR TIMES DAILY  FOR  INJECTIONS 400 each 2   Insulin Syringes, Disposable, U-100 0.5 ML MISC 4x daily injections 100 each 8   isosorbide mononitrate (IMDUR) 30 MG 24 hr tablet TAKE 1 TABLET (30 MG TOTAL) BY MOUTH DAILY. 90 tablet 1   Lancet Devices (ACCU-CHEK SOFTCLIX) lancets Fill for 1 month for TID testing. Use as instructed 90 each 0   Multiple Vitamin (MULTIVITAMIN WITH MINERALS) TABS Take 1 tablet by mouth daily.     NEEDLE, DISP, 30 G (BD DISP NEEDLES) 30G X 1/2" MISC For 4x daily injections 100 each 13   VITAMIN D, CHOLECALCIFEROL, PO  Take 1 tablet by mouth daily.     apixaban (ELIQUIS) 2.5 MG TABS tablet Take 1 tablet (2.5 mg total) by mouth 2 (two) times daily. 180 tablet 3   B Complex-Biotin-FA (B COMPLETE) TABS Take 1 tablet by mouth daily. (Patient not taking: Reported on 05/19/2021)     co-enzyme Q-10 30 MG capsule Take 30 mg by mouth daily. (Patient not taking: Reported on 05/19/2021)     furosemide (LASIX) 40 MG tablet Take 40 mg by mouth 2 (two) times daily.  (Patient not taking: Reported on 05/19/2021)     Current Facility-Administered Medications  Medication Dose Route Frequency Provider Last Rate Last Admin   0.9 %  sodium chloride infusion  500 mL Intravenous Once Milus Banister, MD         Allergies as of 05/19/2021 - Review Complete 05/19/2021  Allergen Reaction Noted   Lisinopril Other (See Comments) 10/09/2011   Peanut-containing drug products Itching and Other (See Comments) 02/12/2013    Family History  Problem Relation Age of Onset   Diabetes Mother        No history CAD   Colon cancer Mother 34       died at 21 from [place]   Hypertension Father        Also had CAD   Heart disease Father    Cervical cancer Sister    Diabetes Sister    Congestive Heart Failure Sister    Diabetes Brother    Kidney disease Brother    Heart disease Maternal Grandmother    Sickle cell anemia Son    Thyroid disease Son     Social History   Socioeconomic History   Marital status: Legally Separated    Spouse name: Not on file   Number of children: 4   Years of education: Not on file   Highest education level: Not on file  Occupational History   Occupation: Disabled   Occupation: disabled  Tobacco Use   Smoking status: Never   Smokeless tobacco: Never  Vaping Use   Vaping Use: Never used  Substance and Sexual Activity   Alcohol use: No   Drug use: No   Sexual activity: Not on file  Other Topics Concern   Not on file  Social History Narrative   Patient lives with her son..   Social Determinants of Health   Financial Resource Strain: Not on file  Food Insecurity: Not on file  Transportation Needs: Not on file  Physical Activity: Not on file  Stress: Not on file  Social Connections: Not on file  Intimate Partner Violence: Not on file     Physical Exam: BP 133/65   Pulse 66   Temp 97.8 F (36.6 C) (Skin)   Ht 5' 6"  (1.676 m)   Wt 224 lb (101.6 kg)   SpO2 97%   BMI 36.15 kg/m  Constitutional: generally well-appearing Psychiatric: alert and oriented x3 Lungs: CTA bilaterally Heart: no MCR  Assessment and plan: 65 y.o. female with microcytic anemia, fh of CRC, lower abd pains  Colonoscopy today, off blood thinner 3 days  Care is appropriate for  the ambulatory setting.  Owens Loffler, MD [facility] 05/19/2021, 2:30 PM

## 2021-05-23 ENCOUNTER — Telehealth: Payer: Self-pay | Admitting: *Deleted

## 2021-05-23 NOTE — Telephone Encounter (Signed)
Patient returned your follow-up call after her procedure. She stated to be feeling fine.  

## 2021-05-23 NOTE — Telephone Encounter (Signed)
Follow up call made. 

## 2021-05-23 NOTE — Telephone Encounter (Signed)
  Follow up Call-  Call back number 05/19/2021  Post procedure Call Back phone  # [phone]  Permission to leave phone message Yes  Some recent data might be hidden     Patient questions:  Do you have a fever, pain , or abdominal swelling? No. Pain Score  0 *  Have you tolerated food without any problems? Yes.    Have you been able to return to your normal activities? Yes.    Do you have any questions about your discharge instructions: Diet   No. Medications  No. Follow up visit  No.  Do you have questions or concerns about your Care? No.  Actions: * If pain score is 4 or above: No action needed, pain <4.  Have you developed a fever since your procedure? no  2.   Have you had an respiratory symptoms (SOB or cough) since your procedure? no  3.   Have you tested positive for COVID 19 since your procedure no  4.   Have you had any family members/close contacts diagnosed with the COVID 19 since your procedure?  no   If yes to any of these questions please route to Joylene John, RN and Joella Prince, RN

## 2021-06-12 ENCOUNTER — Encounter (HOSPITAL_COMMUNITY): Payer: Self-pay

## 2021-06-12 ENCOUNTER — Other Ambulatory Visit: Payer: Self-pay

## 2021-06-12 ENCOUNTER — Emergency Department (HOSPITAL_COMMUNITY)
Admission: EM | Admit: 2021-06-12 | Discharge: 2021-06-13 | Disposition: A | Discharge status: Home / Self Care | Payer: Medicare HMO | Attending: Emergency Medicine | Admitting: Emergency Medicine

## 2021-06-12 DIAGNOSIS — E11319 Type 2 diabetes mellitus with unspecified diabetic retinopathy without macular edema: Secondary | ICD-10-CM | POA: Diagnosis not present

## 2021-06-12 DIAGNOSIS — I129 Hypertensive chronic kidney disease with stage 1 through stage 4 chronic kidney disease, or unspecified chronic kidney disease: Secondary | ICD-10-CM | POA: Diagnosis not present

## 2021-06-12 DIAGNOSIS — Z79899 Other long term (current) drug therapy: Secondary | ICD-10-CM | POA: Insufficient documentation

## 2021-06-12 DIAGNOSIS — E1140 Type 2 diabetes mellitus with diabetic neuropathy, unspecified: Secondary | ICD-10-CM | POA: Insufficient documentation

## 2021-06-12 DIAGNOSIS — N3000 Acute cystitis without hematuria: Secondary | ICD-10-CM | POA: Diagnosis not present

## 2021-06-12 DIAGNOSIS — E1122 Type 2 diabetes mellitus with diabetic chronic kidney disease: Secondary | ICD-10-CM | POA: Insufficient documentation

## 2021-06-12 DIAGNOSIS — Z7901 Long term (current) use of anticoagulants: Secondary | ICD-10-CM | POA: Insufficient documentation

## 2021-06-12 DIAGNOSIS — R110 Nausea: Secondary | ICD-10-CM | POA: Diagnosis not present

## 2021-06-12 DIAGNOSIS — I10 Essential (primary) hypertension: Secondary | ICD-10-CM

## 2021-06-12 DIAGNOSIS — Z794 Long term (current) use of insulin: Secondary | ICD-10-CM | POA: Insufficient documentation

## 2021-06-12 DIAGNOSIS — B9689 Other specified bacterial agents as the cause of diseases classified elsewhere: Secondary | ICD-10-CM | POA: Diagnosis not present

## 2021-06-12 DIAGNOSIS — G4489 Other headache syndrome: Secondary | ICD-10-CM | POA: Diagnosis not present

## 2021-06-12 DIAGNOSIS — Z91010 Allergy to peanuts: Secondary | ICD-10-CM | POA: Diagnosis not present

## 2021-06-12 DIAGNOSIS — R519 Headache, unspecified: Secondary | ICD-10-CM | POA: Diagnosis present

## 2021-06-12 DIAGNOSIS — I2510 Atherosclerotic heart disease of native coronary artery without angina pectoris: Secondary | ICD-10-CM | POA: Insufficient documentation

## 2021-06-12 DIAGNOSIS — N184 Chronic kidney disease, stage 4 (severe): Secondary | ICD-10-CM | POA: Diagnosis not present

## 2021-06-12 LAB — CBC WITH DIFFERENTIAL/PLATELET
Abs Immature Granulocytes: 0.04 10*3/uL (ref 0.00–0.07)
Basophils Absolute: 0.1 10*3/uL (ref 0.0–0.1)
Basophils Relative: 1 %
Eosinophils Absolute: 0 10*3/uL (ref 0.0–0.5)
Eosinophils Relative: 0 %
HCT: 31.7 % — ABNORMAL LOW (ref 36.0–46.0)
Hemoglobin: 9.8 g/dL — ABNORMAL LOW (ref 12.0–15.0)
Immature Granulocytes: 1 %
Lymphocytes Relative: 12 %
Lymphs Abs: 1 10*3/uL (ref 0.7–4.0)
MCH: 24.7 pg — ABNORMAL LOW (ref 26.0–34.0)
MCHC: 30.9 g/dL (ref 30.0–36.0)
MCV: 79.8 fL — ABNORMAL LOW (ref 80.0–100.0)
Monocytes Absolute: 0.5 10*3/uL (ref 0.1–1.0)
Monocytes Relative: 7 %
Neutro Abs: 6.6 10*3/uL (ref 1.7–7.7)
Neutrophils Relative %: 79 %
Platelets: 484 10*3/uL — ABNORMAL HIGH (ref 150–400)
RBC: 3.97 MIL/uL (ref 3.87–5.11)
RDW: 15.5 % (ref 11.5–15.5)
WBC: 8.3 10*3/uL (ref 4.0–10.5)
nRBC: 0 % (ref 0.0–0.2)

## 2021-06-12 NOTE — ED Triage Notes (Signed)
Pt bib [facility].  Pt has had headache, nausea and high blood pressure since yesterday. Pt reports BP at home 210/97. Pt denies numbness/tingling/weakness anywhere.

## 2021-06-12 NOTE — ED Provider Notes (Signed)
Emergency Medicine Provider Triage Evaluation Note  [name] , a 65 y.o. female  was evaluated in triage.  Pt complains of HTN.  States that symptoms started yesterday.  Reports associated headache and dizziness.  States BPs have been in the 200s.  Review of Systems  Positive: Headache, dizziness Negative: CP, SOB  Physical Exam  BP (!) 169/68 (BP Location: Left Arm)   Pulse 68   Temp 98.2 F (36.8 C) (Oral)   Resp 16   SpO2 97%  Gen:   Awake, no distress   Resp:  Normal effort  MSK:   Moves extremities without difficulty  Other:  CN 3-12 intact  Medical Decision Making  Medically screening exam initiated at 10:50 PM.  Appropriate orders placed.  [name] was informed that the remainder of the evaluation will be completed by another provider, this initial triage assessment does not replace that evaluation, and the importance of remaining in the ED until their evaluation is complete.  HTN, headache, dizziness   Montine Circle, Hershal Coria 06/12/21 2252    Elnora Morrison, MD 06/12/21 2352

## 2021-06-13 LAB — BASIC METABOLIC PANEL
Anion gap: 12 (ref 5–15)
BUN: 38 mg/dL — ABNORMAL HIGH (ref 8–23)
CO2: 23 mmol/L (ref 22–32)
Calcium: 9.6 mg/dL (ref 8.9–10.3)
Chloride: 99 mmol/L (ref 98–111)
Creatinine, Ser: 3.38 mg/dL — ABNORMAL HIGH (ref 0.44–1.00)
GFR, Estimated: 14 mL/min — ABNORMAL LOW (ref >60)
Glucose, Bld: 144 mg/dL — ABNORMAL HIGH (ref 70–99)
Potassium: 4.5 mmol/L (ref 3.5–5.1)
Sodium: 134 mmol/L — ABNORMAL LOW (ref 135–145)

## 2021-06-13 LAB — TROPONIN I (HIGH SENSITIVITY)
Troponin I (High Sensitivity): 9 ng/L (ref <18)
Troponin I (High Sensitivity): 10 ng/L (ref <18)

## 2021-06-13 LAB — URINALYSIS, ROUTINE W REFLEX MICROSCOPIC
Bilirubin Urine: NEGATIVE
Glucose, UA: NEGATIVE mg/dL
Hgb urine dipstick: NEGATIVE
Ketones, ur: NEGATIVE mg/dL
Nitrite: NEGATIVE
Protein, ur: 100 mg/dL — AB
Specific Gravity, Urine: 1.01 (ref 1.005–1.030)
pH: 6 (ref 5.0–8.0)

## 2021-06-13 LAB — CBG MONITORING, ED: Glucose-Capillary: 139 mg/dL — ABNORMAL HIGH (ref 70–99)

## 2021-06-13 LAB — URINALYSIS, MICROSCOPIC (REFLEX)
RBC / HPF: NONE SEEN RBC/hpf (ref 0–5)
WBC, UA: >50 WBC/hpf (ref 0–5)

## 2021-06-13 MED ORDER — ISOSORBIDE MONONITRATE ER 30 MG PO TB24
30.0000 mg | ORAL_TABLET | Freq: Every day | ORAL | Status: DC
  Administered 2021-06-13: 30 mg via ORAL
  Filled 2021-06-13: qty 1

## 2021-06-13 MED ORDER — CARVEDILOL 12.5 MG PO TABS
12.5000 mg | ORAL_TABLET | Freq: Two times a day (BID) | ORAL | Status: DC
  Administered 2021-06-13: 12.5 mg via ORAL
  Filled 2021-06-13: qty 1

## 2021-06-13 MED ORDER — HYDRALAZINE HCL 25 MG PO TABS
50.0000 mg | ORAL_TABLET | Freq: Once | ORAL | Status: AC
  Administered 2021-06-13: 50 mg via ORAL
  Filled 2021-06-13: qty 2

## 2021-06-13 MED ORDER — CEPHALEXIN 250 MG PO CAPS
250.0000 mg | ORAL_CAPSULE | Freq: Three times a day (TID) | ORAL | Status: DC
  Administered 2021-06-13: 250 mg via ORAL
  Filled 2021-06-13: qty 1

## 2021-06-13 MED ORDER — INSULIN GLARGINE-YFGN 100 UNIT/ML ~~LOC~~ SOLN
10.0000 [IU] | Freq: Every day | SUBCUTANEOUS | Status: DC
  Administered 2021-06-13: 10 [IU] via SUBCUTANEOUS
  Filled 2021-06-13: qty 0.1

## 2021-06-13 MED ORDER — CEPHALEXIN 250 MG PO CAPS: 250.0000 mg | ORAL_CAPSULE | Freq: Three times a day (TID) | ORAL | 0 refills | Status: AC

## 2021-06-13 NOTE — ED Notes (Signed)
Patient given discharge instructions, all questions answered. Patient in possession of all belongings, directed to the discharge area  

## 2021-06-13 NOTE — ED Provider Notes (Signed)
[place] EMERGENCY DEPARTMENT Provider Note   CSN: [id number] Arrival date & time: 06/12/21  2018     History Chief Complaint  Patient presents with   Hypertension    [name] is a 65 y.o. female with a history of A. fib on Eliquis, hypertension, MI, chronic kidney disease, presenting to emergency department with complaint of high blood pressure and headache.  The patient reports that her blood pressures are spiking for the past few days, she has noted a headache associated with this.  She does often get headaches, but feels that this headache was more intense, and is frontally located behind her right eye.  She noted her blood pressure was elevated 2 nights ago over 323 systolic, normally she is between 557 and 322 systolic.  She reported that yesterday her blood pressure was also high of over 200.  Unfortunately she had a prolonged waiting time last night, and spent approximately 17 hours in the waiting room prior to my assessment in the ED.  This time she reports that her headache is eased up and she was overall feeling better.  She denies any visual changes.  She denies any chest pain or pressure to me.  She does report that her PCP had treated her for UTI about a month ago, she cannot recall which antibiotic she was on.  However she does continue to have some dysuria which she said returned after completing her treatment.  She thinks she may have another urine infection.  She confirms to me that she is normally on Coreg 12.5 mg twice daily, hydralazine 50 mg 3 times daily, and Imdur 30 mg daily.  She has not taken any of her medications last night or this morning due to waiting in the ER.  She also takes her long-term insulin in the morning.  Urine culture shows Klebsiella on a urine culture from May 05, 2021, approximately 1 month ago, that was pansensitive and resistant only to ampicillin.  HPI     Past Medical History:  Diagnosis Date   Anemia     Arthritis    Atrial fibrillation ([place])    CAD (coronary artery disease)    Chronic kidney disease, stage III (moderate) (HCC)    Constipation    Diabetes mellitus ([place])    History of blood transfusion    HLD (hyperlipidemia)    Hypertension    Myocardial infarction [facility])    in April 2014    Patient Active Problem List   Diagnosis Date Noted   Chronic nonintractable headache 03/03/2021   Foot callus 02/27/2021   Counseled about COVID-19 virus infection 02/24/2020   Aortic heart murmur 05/15/2019   Thyroid enlargement 07/19/2014   Vitamin D deficiency 09/03/2013   Hyperlipidemia 04/24/2013   Malunion of fracture 03/17/2013   Wound infection after surgery 03/17/2013   Diabetic retinopathy ([place]) 01/23/2013   Chronic atrial fibrillation ([facility]) 01/15/2013   CAD (coronary artery disease) 01/11/2013   H/O non-ST elevation myocardial infarction (NSTEMI) 01/09/2013   History of stroke 01/08/2013   Diabetic neuropathy 01/08/2013   Hypertension    Diabetes mellitus    Chronic kidney disease (CKD), stage IV (severe) ([place])     Past Surgical History:  Procedure Laterality Date   ANKLE SURGERY Right    x 6 total   BREAST BIOPSY Left    15 or 20 yearsa ago she cant remember laterality    HARDWARE REMOVAL Right 02/13/2013   Procedure: HARDWARE REMOVAL;  Surgeon: Illene Regulus  Sharol Given, MD;  Location: [place];  Service: Orthopedics;  Laterality: Right;  Right Ankle Removal Hardware, Debridement, Place Wound VAC   laser surgery for diabetic retinopathy Bilateral    LEFT HEART CATHETERIZATION WITH CORONARY ANGIOGRAM N/A 01/09/2013   Procedure: LEFT HEART CATHETERIZATION WITH CORONARY ANGIOGRAM;  Surgeon: Jolaine Artist, MD;  Location: [facility] C[facility] LAB;  Service: Cardiovascular;  Laterality: N/A;   ORIF ANKLE FRACTURE Right 01/12/2013   Procedure: OPEN REDUCTION INTERNAL FIXATION (ORIF) ANKLE FRACTURE;  Surgeon: Newt Minion, MD;  Location: [place];  Service: Orthopedics;  Laterality: Right;   PARTIAL  HYSTERECTOMY       OB History   No obstetric history on file.     Family History  Problem Relation Age of Onset   Diabetes Mother        No history CAD   Colon cancer Mother 38       died at 54 from [place]   Hypertension Father        Also had CAD   Heart disease Father    Cervical cancer Sister    Diabetes Sister    Congestive Heart Failure Sister    Diabetes Brother    Kidney disease Brother    Heart disease Maternal Grandmother    Sickle cell anemia Son    Thyroid disease Son     Social History   Tobacco Use   Smoking status: Never   Smokeless tobacco: Never  Vaping Use   Vaping Use: Never used  Substance Use Topics   Alcohol use: No   Drug use: No    Home Medications Prior to Admission medications   Medication Sig Start Date End Date Taking? Authorizing Provider  apixaban (ELIQUIS) 2.5 MG TABS tablet Take 1 tablet (2.5 mg total) by mouth 2 (two) times daily. 12/29/20  Yes Carollee Leitz, MD  Ascorbic Acid (VITAMIN C PO) Take 1 tablet by mouth daily.   Yes [provider]  Blood Glucose Monitoring Suppl (ACCU-CHEK AVIVA PLUS) w/Device KIT CHECK BLOOD GLUCOSE THREE TIMES DAILY 10/26/20  Yes Autry-Lott, Naaman Plummer, DO  calcitRIOL (ROCALTROL) 0.25 MCG capsule TAKE 1 CAPSULE EVERY OTHER DAY. Patient taking differently: Take 0.25 mcg by mouth every Monday, Wednesday, and Friday. 01/24/21  Yes Autry-Lott, Naaman Plummer, DO  carvedilol (COREG) 12.5 MG tablet Take 1 tablet (12.5 mg total) by mouth 2 (two) times daily with a meal. 12/30/20  Yes McDiarmid, Blane Ohara, MD  cephALEXin (KEFLEX) 250 MG capsule Take 1 capsule (250 mg total) by mouth 3 (three) times daily for 5 days. 06/13/21 06/18/21 Yes Shaia Porath, Carola Rhine, MD  diphenhydramine-acetaminophen (TYLENOL PM) 25-500 MG TABS tablet Take 1 tablet by mouth every 4 (four) hours as needed (pain/sleep).   Yes [provider]  furosemide (LASIX) 40 MG tablet Take 40 mg by mouth 2 (two) times daily. 08/12/18  Yes [provider]   glucose blood (ACCU-CHEK AVIVA PLUS) test strip CHECK BLOOD GLUCOSE THREE TIMES DAILY 10/26/20  Yes Autry-Lott, Naaman Plummer, DO  hydrALAZINE (APRESOLINE) 50 MG tablet Take 50 mg by mouth 3 (three) times daily. 09/29/18  Yes [provider]  insulin glargine (LANTUS) 100 UNIT/ML injection Inject 0.2 mLs (20 Units total) into the skin daily. Patient taking differently: Inject 10-15 Units into the skin daily. 10/26/20  Yes Autry-Lott, Naaman Plummer, DO  insulin regular (NOVOLIN R) 100 units/mL injection Inject 0.05-0.1 mLs (5-10 Units total) into the skin 3 (three) times daily before meals. 10/26/20  Yes Autry-Lott, Naaman Plummer, DO  Insulin Syringe-Needle U-100 (  DROPLET INSULIN SYRINGE) 31G X 5/16" 0.5 ML MISC USE FOUR TIMES DAILY  FOR  INJECTIONS 11/15/20  Yes Lattie Haw, MD  Insulin Syringes, Disposable, U-100 0.5 ML MISC 4x daily injections 03/21/18  Yes Riccio, Angela C, DO  isosorbide mononitrate (IMDUR) 30 MG 24 hr tablet TAKE 1 TABLET (30 MG TOTAL) BY MOUTH DAILY. 05/15/21  Yes Autry-Lott, Naaman Plummer, DO  Lancet Devices (ACCU-CHEK SOFTCLIX) lancets Fill for 1 month for TID testing. Use as instructed 11/13/13  Yes Hilton Sinclair, MD  Multiple Vitamin (MULTIVITAMIN WITH MINERALS) TABS Take 1 tablet by mouth daily.   Yes [provider]  NEEDLE, DISP, 30 G (BD DISP NEEDLES) 30G X 1/2" MISC For 4x daily injections 11/05/15  Yes Luiz Blare Y, DO  VITAMIN D, CHOLECALCIFEROL, PO Take 1 tablet by mouth daily.   Yes [provider]    Allergies    Lisinopril and Peanut-containing drug products  Review of Systems   Review of Systems  Constitutional:  Negative for chills and fever.  Eyes:  Negative for photophobia and visual disturbance.  Respiratory:  Negative for cough and shortness of breath.   Cardiovascular:  Negative for chest pain and palpitations.  Gastrointestinal:  Negative for abdominal pain and vomiting.  Genitourinary:  Positive for dysuria. Negative for hematuria.   Musculoskeletal:  Negative for arthralgias and back pain.  Skin:  Negative for color change and rash.  Neurological:  Positive for headaches. Negative for dizziness, seizures, syncope, facial asymmetry, speech difficulty, weakness and light-headedness.  All other systems reviewed and are negative.  Physical Exam Updated Vital Signs BP (!) 147/65   Pulse 66   Temp 97.8 F (36.6 C) (Oral)   Resp 16   SpO2 98%   Physical Exam Constitutional:      General: She is not in acute distress. HENT:     Head: Normocephalic and atraumatic.  Eyes:     Conjunctiva/sclera: Conjunctivae normal.     Pupils: Pupils are equal, round, and reactive to light.  Cardiovascular:     Rate and Rhythm: Normal rate and regular rhythm.  Pulmonary:     Effort: Pulmonary effort is normal. No respiratory distress.  Abdominal:     General: There is no distension.     Tenderness: There is no abdominal tenderness.  Skin:    General: Skin is warm and dry.  Neurological:     General: No focal deficit present.     Mental Status: She is alert and oriented to person, place, and time. Mental status is at baseline.     Sensory: No sensory deficit.     Motor: No weakness.  Psychiatric:        Mood and Affect: Mood normal.        Behavior: Behavior normal.    ED Results / Procedures / Treatments   Labs (all labs ordered are listed, but only abnormal results are displayed) Labs Reviewed  CBC WITH DIFFERENTIAL/PLATELET - Abnormal; Notable for the following components:      Result Value   Hemoglobin 9.8 (*)    HCT 31.7 (*)    MCV 79.8 (*)    MCH 24.7 (*)    Platelets 484 (*)    All other components within normal limits  BASIC METABOLIC PANEL - Abnormal; Notable for the following components:   Sodium 134 (*)    Glucose, Bld 144 (*)    BUN 38 (*)    Creatinine, Ser 3.38 (*)    GFR, Estimated 14 (*)  All other components within normal limits  URINALYSIS, ROUTINE W REFLEX MICROSCOPIC - Abnormal; Notable  for the following components:   APPearance TURBID (*)    Protein, ur 100 (*)    Leukocytes,Ua LARGE (*)    All other components within normal limits  URINALYSIS, MICROSCOPIC (REFLEX) - Abnormal; Notable for the following components:   Bacteria, UA RARE (*)    Non Squamous Epithelial PRESENT (*)    All other components within normal limits  CBG MONITORING, ED - Abnormal; Notable for the following components:   Glucose-Capillary 139 (*)    All other components within normal limits  URINE CULTURE  TROPONIN I (HIGH SENSITIVITY)  TROPONIN I (HIGH SENSITIVITY)    EKG None  Radiology No results found.  Procedures Procedures   Medications Ordered in ED Medications  carvedilol (COREG) tablet 12.5 mg (12.5 mg Oral Given 06/13/21 1431)  isosorbide mononitrate (IMDUR) 24 hr tablet 30 mg (30 mg Oral Given 06/13/21 1431)  insulin glargine-yfgn (SEMGLEE) injection 10 Units (10 Units Subcutaneous Given 06/13/21 1520)  cephALEXin (KEFLEX) capsule 250 mg (250 mg Oral Given 06/13/21 1451)  hydrALAZINE (APRESOLINE) tablet 50 mg (50 mg Oral Given 06/13/21 1431)    ED Course  I have reviewed the triage vital signs and the nursing notes.  Pertinent labs & imaging results that were available during my care of the patient were reviewed by me and considered in my medical decision making (see chart for details).  Patient is here complaining of high blood pressure and headache.  Blood pressure appears to moderated during her wait in the emergency department overnight.  Her headache is also improved.  She has no neurological deficits or loss of vision to suggest hypertensive emergency.  She has no localizing signs or symptoms of stroke.  Headache is also been waxing and waning.  I do suspect she may have symptomatic hypertension, although there may be another source triggering this, such as a UTI.  Her UA does suggest that continues have a UTI.  I will initiate antibiotics for this.  Also order her home blood  pressure medications which she missed last night and this morning.  I have ordered her morning long-acting insulin as well as her Eliquis while she is here.  I personally reviewed her labs.  Delta troponins have been flat.  Creatinine does show some elevation at 3.3 today, with her more recent baseline being around 2.8.  I explained that she needs to follow-up with a nephrologist for this.  She already sees one.  She has no fever, nuchal rigidity, leukocytosis, to suggest meningitis.  Clinical Course as of 06/13/21 1654  Tue Jun 13, 2021  1554 She reports continued provement of her headache which is very minimal now.  Blood pressures come down with her medications.  We discussed UTI treatment at home, I advised that if her blood pressure spikes again at home she can consider an additional dose of 50 mg hydralazine.  She otherwise will follow up with her nephrologist regarding her creatinine level, and states that she has monthly appointments with her nephrology. [MT]    Clinical Course User Index [MT] Lauralee Waters, Carola Rhine, MD    Final Clinical Impression(s) / ED Diagnoses Final diagnoses:  Hypertension, unspecified type  Acute cystitis without hematuria    Rx / DC Orders ED Discharge Orders          Ordered    cephALEXin (KEFLEX) 250 MG capsule  3 times daily  06/13/21 1557             Wyvonnia Dusky, MD 06/13/21 [phone]

## 2021-06-13 NOTE — Discharge Instructions (Addendum)
You are diagnosed with a urine infection today.  I prescribed you antibiotics to take for the next 5 days for this infection.  We talked about your high blood pressure.  Keep monitoring your blood pressure and keep a diary of your daily blood pressures at home.  You should follow-up with your primary care doctor or your cardiologist about your high blood pressures.  Continue taking your blood pressure medications as prescribed.  If you notice that your blood pressure is again getting very high (eg. top number is over 180 mmhg), and you begin having symptoms like a headache, you can take an extra 50 mg of hydralazine with your usual dosing.  If your symptoms are not improving, or if you have a worsening headache, loss of vision, dizziness, vomiting, chest pain, difficulty breathing, please call 911 and come back to the ER immediately.  Please also follow up with your kidney doctor about your kidney function.

## 2021-06-14 ENCOUNTER — Telehealth: Payer: Self-pay | Admitting: Gastroenterology

## 2021-06-14 LAB — URINE CULTURE: Culture: >=100000 — AB

## 2021-06-14 NOTE — Telephone Encounter (Signed)
Returned call to patient with questions about referral to [facility].  Patient states she was told by insurance company that she would need prior authorization completed by Dr Ardis Hughs prior to being seen at [facility].  I explained to the patient that I have never heard of this.   I called [facility] and spoke with Maudie Mercury regarding prior authorization.  Kim reviewed patients insurance coverage and advised that the patient does not need prior auth or insurance referral to be completed prior to appointment.  Maudie Mercury stated that if patient requires surgery or other imagining, her insurance would then require prior auth and [facility] would be responsible for obtaining.  Phone call returned to patient and she was informed of information.  Patient would also like a second opinion and would like to be referred to Dr Alcide Evener with [facility].   Patient aware that she is scheduled to see Dr Quentin Cornwall at [facility] on 06-21-21 at 230pm.  Patient verbalized understanding and agreed to plan.  No further questions.

## 2021-06-15 ENCOUNTER — Telehealth: Payer: Self-pay | Admitting: *Deleted

## 2021-06-15 NOTE — Telephone Encounter (Signed)
Post ED Visit - Positive Culture Follow-up  Culture report reviewed by antimicrobial stewardship pharmacist: [place] Team []  Elenor Quinones, Pharm.D. []  Heide Guile, Pharm.D., BCPS AQ-ID []  Parks Neptune, Pharm.D., BCPS []  Alycia Rossetti, Pharm.D., BCPS []  [place], Pharm.D., BCPS, AAHIVP []  Legrand Como, Pharm.D., BCPS, AAHIVP []  Salome Arnt, PharmD, BCPS []  Johnnette Gourd, PharmD, BCPS []  Hughes Better, PharmD, BCPS []  Leeroy Cha, PharmD []  Laqueta Linden, PharmD, BCPS []  Albertina Parr, PharmD  [place] Team []  Leodis Sias, PharmD []  Lindell Spar, PharmD []  Royetta Asal, PharmD []  Graylin Shiver, Rph []  Rema Fendt) Glennon Mac, PharmD []  Arlyn Dunning, PharmD []  Netta Cedars, PharmD []  Dia Sitter, PharmD []  Leone Haven, PharmD []  Gretta Arab, PharmD []  Theodis Shove, PharmD []  Peggyann Juba, PharmD []  Reuel Boom, PharmD   Positive urine culture Treated with Cephalexin, organism sensitive to the same and no further patient follow-up is required at this time.  Joetta Manners, PharmD  Harlon Flor Talley 06/15/2021, 9:48 AM

## 2021-06-19 ENCOUNTER — Ambulatory Visit: Payer: Self-pay | Admitting: Surgery

## 2021-06-19 DIAGNOSIS — Z7901 Long term (current) use of anticoagulants: Secondary | ICD-10-CM | POA: Diagnosis not present

## 2021-06-19 DIAGNOSIS — D122 Benign neoplasm of ascending colon: Secondary | ICD-10-CM | POA: Diagnosis not present

## 2021-06-19 DIAGNOSIS — E119 Type 2 diabetes mellitus without complications: Secondary | ICD-10-CM | POA: Diagnosis not present

## 2021-06-19 DIAGNOSIS — E669 Obesity, unspecified: Secondary | ICD-10-CM | POA: Diagnosis not present

## 2021-06-19 DIAGNOSIS — N184 Chronic kidney disease, stage 4 (severe): Secondary | ICD-10-CM | POA: Diagnosis not present

## 2021-06-19 DIAGNOSIS — Z8679 Personal history of other diseases of the circulatory system: Secondary | ICD-10-CM | POA: Diagnosis not present

## 2021-06-19 DIAGNOSIS — D120 Benign neoplasm of cecum: Secondary | ICD-10-CM | POA: Diagnosis not present

## 2021-06-19 DIAGNOSIS — Z794 Long term (current) use of insulin: Secondary | ICD-10-CM | POA: Diagnosis not present

## 2021-06-20 ENCOUNTER — Telehealth: Payer: Self-pay | Admitting: *Deleted

## 2021-06-20 NOTE — Telephone Encounter (Signed)
   [facility] Pre-operative Risk Assessment    Patient Name: [name]  DOB: 1956-03-09 MRN: [id number]   Request for surgical clearance:  What type of surgery is being performed? Robotic colectomy   When is this surgery scheduled? TBD  What type of clearance is required (medical clearance vs. Pharmacy clearance to hold med vs. Both)? both  Are there any medications that need to be held prior to surgery and how long? Eliquis  Practice name and name of physician performing surgery? [facility]   What is the office phone number? [phone]   7.   What is the office fax number? [phone] attn: Illene Regulus, CMA  8.   Anesthesia type (None, local, MAC, general) ? general   Jurgen Groeneveld A Marylynne Keelin 06/20/2021, 2:10 PM  _________________________________________________________________   (provider comments below)

## 2021-06-21 DIAGNOSIS — Z7901 Long term (current) use of anticoagulants: Secondary | ICD-10-CM | POA: Diagnosis not present

## 2021-06-21 DIAGNOSIS — E1321 Other specified diabetes mellitus with diabetic nephropathy: Secondary | ICD-10-CM | POA: Diagnosis not present

## 2021-06-21 DIAGNOSIS — D122 Benign neoplasm of ascending colon: Secondary | ICD-10-CM | POA: Diagnosis not present

## 2021-06-21 DIAGNOSIS — I4811 Longstanding persistent atrial fibrillation: Secondary | ICD-10-CM | POA: Diagnosis not present

## 2021-06-21 MED ORDER — APIXABAN 2.5 MG PO TABS: 2.5000 mg | ORAL_TABLET | Freq: Two times a day (BID) | ORAL | 1 refills | Status: DC

## 2021-06-21 NOTE — Telephone Encounter (Signed)
Patient with diagnosis of afib on Eliquis for anticoagulation.    Procedure: robotic colectomy Date of procedure: TBD  CHA2DS2-VASc Score = 7  This indicates a 11.2% annual risk of stroke. The patient's score is based upon: CHF History: 0 HTN History: 1 Diabetes History: 1 Stroke History: 2 Vascular Disease History: 1 Age Score: 1 Gender Score: 1   CrCl 43mL/min using adjusted body weight Platelet count 484K  Procedure is more invasive and will likely require Eliquis to be held for at least 2 days. In setting of pt's elevated CV risk, will defer to MD on appropriate length of anticoag hold preop.

## 2021-06-22 DIAGNOSIS — D571 Sickle-cell disease without crisis: Secondary | ICD-10-CM | POA: Diagnosis not present

## 2021-06-22 DIAGNOSIS — E1122 Type 2 diabetes mellitus with diabetic chronic kidney disease: Secondary | ICD-10-CM | POA: Diagnosis not present

## 2021-06-22 DIAGNOSIS — E11311 Type 2 diabetes mellitus with unspecified diabetic retinopathy with macular edema: Secondary | ICD-10-CM | POA: Diagnosis not present

## 2021-06-22 DIAGNOSIS — H40013 Open angle with borderline findings, low risk, bilateral: Secondary | ICD-10-CM | POA: Diagnosis not present

## 2021-06-22 DIAGNOSIS — E113513 Type 2 diabetes mellitus with proliferative diabetic retinopathy with macular edema, bilateral: Secondary | ICD-10-CM | POA: Diagnosis not present

## 2021-06-22 LAB — HM DIABETES EYE EXAM

## 2021-06-27 NOTE — Telephone Encounter (Signed)
Dr. Margaretann Loveless please advise/recommend parameters for Eliquis hold prior to robotic colectomy.  Thank you,  Jossie Ng. Kristle Wesch NP-C    06/27/2021, 9:26 AM [address] Office [phone] Fax [phone]

## 2021-06-28 NOTE — Telephone Encounter (Signed)
Mychart message sent.

## 2021-06-28 NOTE — Telephone Encounter (Addendum)
   Name: [name]  DOB: [birth year]  MRN: [id number]   Primary Cardiologist: Elouise Munroe, MD  Chart reviewed as part of pre-operative protocol coverage. Patient was contacted 06/28/2021 in reference to pre-operative risk assessment for pending surgery as outlined below.  She states she has elected to go with a different group and will not be having surgery until December with [facility]. She has a f/u with Dr. Margaretann Loveless 08/03/21 at which time pre-op clearance can be addressed - added to appt notes. Patient will notify if any scheduling changes occur. Per office protocol, the provider should assess clearance at time of office visit and should forward their finalized clearance decision to requesting surgeon. Anticoagulation recommendations can be finalized at that time as well. I'll route to pharm team as FYI.  Will route to callback to please rote this note to requesting party as FYI (fax number is not complete below - not enough numbers). Will otherwise remove this message from the pre-op box.   Charlie Pitter, PA-C 06/28/2021, 8:13 AM

## 2021-06-29 NOTE — Telephone Encounter (Signed)
Notes sent to [facility]

## 2021-08-02 NOTE — Progress Notes (Signed)
Cardiology Office Note:    Date:  04/20/2021   ID:  [name], DOB Nov 05, 1955, MRN [id number]  PCP:  Gerlene Fee, DO  Cardiologist:  Elouise Munroe, MD  Electrophysiologist:  None   Referring MD: Gerlene Fee, DO   Chief Complaint/Reason for Referral: Preoperative risk stratification.  History of Present Illness:    [name] is a 65 y.o. female with a history of afib, anemia, CAD, diabetes mellitus, CKD, NSTEMI hyperlipidemia and hypertension. Preoperative risk stratification requested for robotic hemicolectomy and anticoagulation guidance.   Today, she is doing well after her colonoscopy and is scheduled for colectomy with Dr. Quentin Cornwall at [place] on 08/24/21.  The chest pain she reported on her last visit has improved. She reports her last noticeable Afib episode was in August. She is also complaint in taking her Eliquis.   The patient denies chest pain, chest pressure, dyspnea at rest or with exertion, PND, orthopnea, or leg swelling. Denies cough, fever, chills, nausea, or vomiting. Denies syncope, presyncope, or snoring. Denies dizziness or lightheadedness.   Past Medical History:  Diagnosis Date   Anemia    Arthritis    Atrial fibrillation ([place])    CAD (coronary artery disease)    Chronic kidney disease, stage III (moderate) (HCC)    Constipation    Diabetes mellitus ([place])    History of blood transfusion    HLD (hyperlipidemia)    Hypertension    Myocardial infarction [facility])    in April 2014    Past Surgical History:  Procedure Laterality Date   ANKLE SURGERY Right    x 6 total   BREAST BIOPSY Left    15 or 20 yearsa ago she cant remember laterality    HARDWARE REMOVAL Right 02/13/2013   Procedure: HARDWARE REMOVAL;  Surgeon: Newt Minion, MD;  Location: [place];  Service: Orthopedics;  Laterality: Right;  Right Ankle Removal Hardware, Debridement, Place Wound VAC   laser surgery for diabetic retinopathy Bilateral    LEFT HEART  CATHETERIZATION WITH CORONARY ANGIOGRAM N/A 01/09/2013   Procedure: LEFT HEART CATHETERIZATION WITH CORONARY ANGIOGRAM;  Surgeon: Jolaine Artist, MD;  Location: [facility] CATH LAB;  Service: Cardiovascular;  Laterality: N/A;   ORIF ANKLE FRACTURE Right 01/12/2013   Procedure: OPEN REDUCTION INTERNAL FIXATION (ORIF) ANKLE FRACTURE;  Surgeon: Newt Minion, MD;  Location: [place];  Service: Orthopedics;  Laterality: Right;   PARTIAL HYSTERECTOMY      Current Medications: Current Meds  Medication Sig   amLODipine (NORVASC) 5 MG tablet    apixaban (ELIQUIS) 2.5 MG TABS tablet Take 1 tablet (2.5 mg total) by mouth 2 (two) times daily.   Ascorbic Acid (VITAMIN C PO) Take 1 tablet by mouth daily.   Blood Glucose Monitoring Suppl (ACCU-CHEK AVIVA PLUS) w/Device KIT CHECK BLOOD GLUCOSE THREE TIMES DAILY   calcitRIOL (ROCALTROL) 0.25 MCG capsule TAKE 1 CAPSULE EVERY OTHER DAY. (Patient taking differently: Take 0.25 mcg by mouth every Monday, Wednesday, and Friday.)   carvedilol (COREG) 12.5 MG tablet Take 1 tablet (12.5 mg total) by mouth 2 (two) times daily with a meal.   diphenhydramine-acetaminophen (TYLENOL PM) 25-500 MG TABS tablet Take 1 tablet by mouth every 4 (four) hours as needed (pain/sleep).   furosemide (LASIX) 40 MG tablet Take 40 mg by mouth 2 (two) times daily.   glucose blood (ACCU-CHEK AVIVA PLUS) test strip CHECK BLOOD GLUCOSE THREE TIMES DAILY   hydrALAZINE (APRESOLINE) 50 MG tablet Take 50 mg by mouth 3 (three) times  daily.   insulin glargine (LANTUS) 100 UNIT/ML injection Inject 0.2 mLs (20 Units total) into the skin daily. (Patient taking differently: Inject 10-15 Units into the skin daily.)   insulin regular (NOVOLIN R) 100 units/mL injection Inject 0.05-0.1 mLs (5-10 Units total) into the skin 3 (three) times daily before meals.   Insulin Syringe-Needle U-100 (DROPLET INSULIN SYRINGE) 31G X 5/16" 0.5 ML MISC USE FOUR TIMES DAILY  FOR  INJECTIONS   Insulin Syringes, Disposable, U-100  0.5 ML MISC 4x daily injections   isosorbide mononitrate (IMDUR) 30 MG 24 hr tablet TAKE 1 TABLET (30 MG TOTAL) BY MOUTH DAILY.   Lancet Devices (ACCU-CHEK SOFTCLIX) lancets Fill for 1 month for TID testing. Use as instructed   Multiple Vitamin (MULTIVITAMIN WITH MINERALS) TABS Take 1 tablet by mouth daily.   NEEDLE, DISP, 30 G (BD DISP NEEDLES) 30G X 1/2" MISC For 4x daily injections   VITAMIN D, CHOLECALCIFEROL, PO Take 1 tablet by mouth daily.     Allergies:   Nsaids, Lisinopril, and Peanut-containing drug products   Social History   Tobacco Use   Smoking status: Never   Smokeless tobacco: Never  Vaping Use   Vaping Use: Never used  Substance Use Topics   Alcohol use: No   Drug use: No     Family History: The patient's family history includes Cervical cancer in her sister; Colon cancer (age of onset: 28) in her mother; Congestive Heart Failure in her sister; Diabetes in her brother, mother, and sister; Heart disease in her father and maternal grandmother; Hypertension in her father; Kidney disease in her brother; Sickle cell anemia in her son; Thyroid disease in her son.  ROS:   Please see the history of present illness.    All other systems reviewed and are negative.  EKGs/Labs/Other Studies Reviewed:    The following studies were reviewed today: Myoview Lexiscan 05/02/21 The left ventricular ejection fraction is normal (55-65%). Nuclear stress EF: 58%. There was no ST segment deviation noted during stress. The study is normal. This is a low risk study. Normal stress nuclear study with no ischemia or infarction.  Gated ejection fraction 58% with normal wall motion  EKG:   08/03/21: NSR, rate 60 bpm; sinus arrhythmia 04/20/2021: NSR left axis deviation  Recent Labs: 06/12/2021: BUN 38; Creatinine, Ser 3.38; Hemoglobin 9.8; Platelets 484; Potassium 4.5; Sodium 134  Recent Lipid Panel    Component Value Date/Time   CHOL 134 12/21/2019 1802   TRIG 82 12/21/2019 1802    HDL 49 12/21/2019 1802   CHOLHDL 2.7 12/21/2019 1802   CHOLHDL 3.3 07/13/2014 1520   VLDL 17 07/13/2014 1520   LDLCALC 69 12/21/2019 1802    Physical Exam:    VS:  BP 124/78   Pulse 67   Ht 5' 7"  (1.702 m)   Wt 211 lb (95.7 kg)   SpO2 98%   BMI 33.05 kg/m     Wt Readings from Last 5 Encounters:  08/03/21 211 lb (95.7 kg)  05/19/21 224 lb (101.6 kg)  05/02/21 223 lb (101.2 kg)  04/20/21 223 lb (101.2 kg)  04/12/21 224 lb 6 oz (101.8 kg)    Constitutional: No acute distress Eyes: sclera non-icteric, normal conjunctiva and lids ENMT: normal dentition, moist mucous membranes Cardiovascular: regular rhythm, normal rate, no murmurs. S1 and S2 normal. Radial pulses normal bilaterally. No jugular venous distention.  Respiratory: clear to auscultation bilaterally GI : normal bowel sounds, soft and nontender. No distention.   MSK: extremities warm,  well perfused. No edema NEURO: grossly nonfocal exam, moves all extremities. PSYCH: alert and oriented x 3, normal mood and affect.   ASSESSMENT:    1. Coronary artery disease of native artery of native heart with stable angina pectoris ([place])   2. Preoperative cardiovascular examination     PLAN:    Preoperative cardiovascular examination Chest pain, unspecified type - Plan: EKG 12-Lead Coronary artery disease of native artery of native heart with stable angina pectoris (HCC) H/O non-ST elevation myocardial infarction (NSTEMI) -Lexiscan Myoview normal and she has had no significant recurrence of chest pain.  EKG performed in the office today is unchanged from prior. -The patient is intermediate risk for intermediate risk procedure.  No further cardiovascular testing is required prior to the procedure.  If this level of risk is acceptable to the patient and surgical team, the patient should be considered optimized from a cardiovascular standpoint.  Patient was counseled on risk stratification, she understands and is willing to  proceed.  Paroxysmal atrial fibrillation (HCC) Secondary hypercoagulable state ([place]) Long term (current) use of anticoagulants -Overall does not have bothersome palpitations and continues on Eliquis 2.5 mg twice daily, previously decided to be on a reduced dose due to renal dysfunction.  Recommend holding Eliquis for at least 2 days prior to hemicolectomy.  Patient has been instructed on this.  Resume Eliquis as soon as safe from surgical standpoint, to be directed by surgical team. -Takes carvedilol 12.5 mg twice daily and has not had concerning RVR recently.  Continue  Hyperlipidemia, unspecified hyperlipidemia type -Most recent lipid panel shows an LDL of 69 and triglycerides of 82 HDL of 49.  This is from a lipid panel last year.  I do not see that she is currently on statin therapy.  With known coronary artery disease we will need to revisit this at follow-up.  Primary hypertension-blood pressure is stable today - continue on carvedilol 12.5 mg BId, Lasix 40 mg twice daily, hydralazine 2-3 times a day, Imdur 30 mg daily.  No medication changes today.  Total time of encounter: 30 minutes total time of encounter, including 20 minutes spent in face-to-face patient care on the date of this encounter. This time includes coordination of care and counseling regarding above mentioned problem list. Remainder of non-face-to-face time involved reviewing chart documents/testing relevant to the patient encounter and documentation in the medical record. I have independently reviewed documentation from referring provider.    Cherlynn Kaiser, MD, [place]   Shared Decision Making/Informed Consent:     Medication Adjustments/Labs and Tests Ordered: Current medicines are reviewed at length with the patient today.  Concerns regarding medicines are outlined above.   Orders Placed This Encounter  Procedures   EKG 12-Lead     No orders of the defined types were placed in this  encounter.   Patient Instructions  Medication Instructions:  No Changes In Medications at this time.  *If you need a refill on your cardiac medications before your next appointment, please call your pharmacy*  Follow-Up: At [facility], you and your health needs are our priority.  As part of our continuing mission to provide you with exceptional heart care, we have created designated Provider Care Teams.  These Care Teams include your primary Cardiologist (physician) and Advanced Practice Providers (APPs -  Physician Assistants and Nurse Practitioners) who all work together to provide you with the care you need, when you need it.  Your next appointment:   6 month(s)  The format for your next appointment:   In Person  Provider:   Elouise Munroe, MD {    Wilhemina Bonito as a scribe for Elouise Munroe, MD.,have documented all relevant documentation on the behalf of Elouise Munroe, MD,as directed by  Elouise Munroe, MD while in the presence of Elouise Munroe, MD.  I, Elouise Munroe, MD, have reviewed all documentation for this visit. The documentation on 08/03/21 for the exam, diagnosis, procedures, and orders are all accurate and complete.

## 2021-08-03 ENCOUNTER — Encounter: Payer: Self-pay | Admitting: Internal Medicine

## 2021-08-03 ENCOUNTER — Ambulatory Visit: Payer: Medicare HMO | Admitting: Internal Medicine

## 2021-08-03 ENCOUNTER — Other Ambulatory Visit: Payer: Self-pay

## 2021-08-03 VITALS — BP 124/78 | HR 67 | Ht 67.0 in | Wt 211.0 lb

## 2021-08-03 DIAGNOSIS — I25118 Atherosclerotic heart disease of native coronary artery with other forms of angina pectoris: Secondary | ICD-10-CM

## 2021-08-03 DIAGNOSIS — Z01810 Encounter for preprocedural cardiovascular examination: Secondary | ICD-10-CM

## 2021-08-03 NOTE — Patient Instructions (Signed)
Medication Instructions:  ?No Changes In Medications at this time.  ?*If you need a refill on your cardiac medications before your next appointment, please call your pharmacy* ? ?Follow-Up: ?At [facility], you and your health needs are our priority.  As part of our continuing mission to provide you with exceptional heart care, we have created designated Provider Care Teams.  These Care Teams include your primary Cardiologist (physician) and Advanced Practice Providers (APPs -  Physician Assistants and Nurse Practitioners) who all work together to provide you with the care you need, when you need it. ? ?Your next appointment:   ?6 month(s) ? ?The format for your next appointment:   ?In Person ? ?Provider:   ?Gayatri A Acharya, MD   ? ?  ?

## 2021-08-22 ENCOUNTER — Other Ambulatory Visit: Payer: Self-pay | Admitting: Family Medicine

## 2021-08-24 ENCOUNTER — Telehealth: Payer: Self-pay | Admitting: Gastroenterology

## 2021-08-24 NOTE — Telephone Encounter (Signed)
Hey Dr. Ardis Hughs,   We received a call from Dr. Quentin Cornwall, [facility]. He would like to touch base with you about patient. Contact number is [phone]  Thank you

## 2021-08-29 ENCOUNTER — Other Ambulatory Visit: Payer: Self-pay | Admitting: Family Medicine

## 2021-11-27 ENCOUNTER — Other Ambulatory Visit: Payer: Self-pay | Admitting: Family Medicine

## 2021-12-21 ENCOUNTER — Other Ambulatory Visit: Payer: Self-pay | Admitting: Family Medicine

## 2021-12-21 DIAGNOSIS — I10 Essential (primary) hypertension: Secondary | ICD-10-CM

## 2022-01-16 ENCOUNTER — Other Ambulatory Visit: Payer: Self-pay | Admitting: Family Medicine

## 2022-01-22 ENCOUNTER — Other Ambulatory Visit: Payer: Self-pay | Admitting: Family Medicine

## 2022-01-22 DIAGNOSIS — Z1231 Encounter for screening mammogram for malignant neoplasm of breast: Secondary | ICD-10-CM

## 2022-01-24 ENCOUNTER — Other Ambulatory Visit: Payer: Self-pay | Admitting: Family Medicine

## 2022-01-30 ENCOUNTER — Other Ambulatory Visit: Payer: Self-pay | Admitting: Family Medicine

## 2022-02-02 ENCOUNTER — Ambulatory Visit
Admission: RE | Admit: 2022-02-02 | Discharge: 2022-02-02 | Disposition: A | Discharge status: Home / Self Care | Payer: Medicare HMO | Source: Ambulatory Visit | Attending: Family Medicine | Admitting: Family Medicine

## 2022-02-02 DIAGNOSIS — Z1231 Encounter for screening mammogram for malignant neoplasm of breast: Secondary | ICD-10-CM

## 2022-02-27 ENCOUNTER — Encounter: Payer: Self-pay | Admitting: *Deleted

## 2022-04-04 ENCOUNTER — Other Ambulatory Visit: Payer: Self-pay | Admitting: Family Medicine

## 2022-04-04 ENCOUNTER — Other Ambulatory Visit: Payer: Self-pay | Admitting: Internal Medicine

## 2022-06-22 ENCOUNTER — Other Ambulatory Visit: Payer: Self-pay | Admitting: Family Medicine

## 2022-09-01 ENCOUNTER — Other Ambulatory Visit: Payer: Self-pay | Admitting: Family Medicine

## 2022-10-04 ENCOUNTER — Other Ambulatory Visit: Payer: Self-pay | Admitting: *Deleted

## 2022-10-04 DIAGNOSIS — I482 Chronic atrial fibrillation, unspecified: Secondary | ICD-10-CM

## 2022-10-04 MED ORDER — APIXABAN 2.5 MG PO TABS: 2.5000 mg | ORAL_TABLET | Freq: Two times a day (BID) | ORAL | 0 refills | Status: DC

## 2022-10-04 NOTE — Telephone Encounter (Signed)
Eliquis 2.'5mg'$  refill request received. Patient is 67 years old, weight-95.7kg, Crea-4.23 on 08/21/2022 via [facility], Diagnosis-Afib, and last seen by Dr. Margaretann Loveless on 08/03/2021 and has a follow up appt on 12/27/2022. Per last OV Overall does not have bothersome palpitations and continues on Eliquis 2.5 mg twice daily, previously decided to be on a reduced dose due to renal dysfunction.  Will send in limited refill to requested pharmacy until appt.

## 2022-11-03 ENCOUNTER — Other Ambulatory Visit: Payer: Self-pay | Admitting: Student

## 2022-11-15 ENCOUNTER — Telehealth: Payer: Self-pay | Admitting: Student

## 2022-11-23 ENCOUNTER — Other Ambulatory Visit: Payer: Self-pay | Admitting: Student

## 2022-11-23 DIAGNOSIS — I10 Essential (primary) hypertension: Secondary | ICD-10-CM

## 2022-11-23 MED ORDER — ISOSORBIDE MONONITRATE ER 30 MG PO TB24: ORAL_TABLET | ORAL | 0 refills | Status: DC

## 2022-11-23 NOTE — Progress Notes (Signed)
Called pt to discuss recent refill request for Imdur. Pt states she no longer takes this medicine and no longer comes to [facility] for care.

## 2022-11-23 NOTE — Telephone Encounter (Signed)
Pharmacy calls nurse line requesting an updated prescription on Isosorbide mononitrate.   The sig states patients needs to make an apt.   Please add directions of use and resend to [place].

## 2022-11-30 ENCOUNTER — Other Ambulatory Visit: Payer: Self-pay | Admitting: Family

## 2022-11-30 DIAGNOSIS — Z1231 Encounter for screening mammogram for malignant neoplasm of breast: Secondary | ICD-10-CM

## 2022-12-24 ENCOUNTER — Encounter (HOSPITAL_COMMUNITY): Payer: Self-pay | Admitting: *Deleted

## 2022-12-26 NOTE — Progress Notes (Signed)
Cardiology Office Note:    Date:  12/27/2022  ID:  AHMIA GERST, DOB 1956-01-01, MRN [id number]  PCP:  Erick Alley, DO  Cardiologist:  Parke Poisson, MD  Electrophysiologist:  None   Referring MD: Erick Alley, DO   Chief Complaint/Reason for Referral: Follow-up  History of Present Illness:    [name] is a 67 y.o. female with a history of afib, anemia, CAD, diabetes mellitus, CKD, NSTEMI hyperlipidemia and hypertension. She presents today for follow-up.   At her last visit she was doing well after her colonoscopy and scheduled for colectomy with Dr. Roxan Hockey at [place] on 08/24/21. Preoperative risk stratification requested for robotic hemicolectomy and anticoagulation guidance. The chest pain she reported had improved. Her last noticeable Afib episode was in August. She was complaint with her Eliquis.    Today, she states she is feeling okay for the most part. She is feeling a little nauseous today with a headache; onset was on the way to this appointment. Of note, her blood pressure is elevated in clinic at 158/68, which she confirms is high for her. She has not noticed any correlations between her blood pressures and her symptoms. However, she had thought she may be in Afib as she believes she had a recent episode. Her EKG today shows NSR. When she is in Afib, she will rest and wait for her episode to subside. Typically her episodes of either palpitations or arrhythmias are infrequent, even months between episodes. She remains compliant with Eliquis; no recent bleeding issues. Currently she is taking 2.5 mg in AM and PM per her nephrologist.  She is taking furosemide 40 mg BID.  She confirms that she has been anemic for many years.   Since her last visit she underwent partial colectomy and has recovered well.   She denies any chest pain, shortness of breath, or peripheral edema. No lightheadedness, syncope, orthopnea, or PND.   Past Medical History:  Diagnosis  Date   Anemia    Arthritis    Atrial fibrillation    CAD (coronary artery disease)    Chronic kidney disease, stage III (moderate)    Constipation    Diabetes mellitus    History of blood transfusion    HLD (hyperlipidemia)    Hypertension    Myocardial infarction    in April 2014    Past Surgical History:  Procedure Laterality Date   ANKLE SURGERY Right    x 6 total   BREAST BIOPSY Left    15 or 20 yearsa ago she cant remember laterality    HARDWARE REMOVAL Right 02/13/2013   Procedure: HARDWARE REMOVAL;  Surgeon: Nadara Mustard, MD;  Location: [facility] OR;  Service: Orthopedics;  Laterality: Right;  Right Ankle Removal Hardware, Debridement, Place Wound VAC   laser surgery for diabetic retinopathy Bilateral    LEFT HEART CATHETERIZATION WITH CORONARY ANGIOGRAM N/A 01/09/2013   Procedure: LEFT HEART CATHETERIZATION WITH CORONARY ANGIOGRAM;  Surgeon: Dolores Patty, MD;  Location: [facility] CATH LAB;  Service: Cardiovascular;  Laterality: N/A;   ORIF ANKLE FRACTURE Right 01/12/2013   Procedure: OPEN REDUCTION INTERNAL FIXATION (ORIF) ANKLE FRACTURE;  Surgeon: Nadara Mustard, MD;  Location: [facility] OR;  Service: Orthopedics;  Laterality: Right;   PARTIAL HYSTERECTOMY      Current Medications: Current Meds  Medication Sig   ACCU-CHEK AVIVA PLUS test strip CHECK BLOOD GLUCOSE THREE TIMES DAILY   amLODipine (NORVASC) 5 MG tablet    apixaban (ELIQUIS) 2.5 MG TABS tablet Take  1 tablet (2.5 mg total) by mouth 2 (two) times daily.   Ascorbic Acid (VITAMIN C PO) Take 1 tablet by mouth daily.   Blood Glucose Monitoring Suppl (ACCU-CHEK AVIVA PLUS) w/Device KIT CHECK BLOOD GLUCOSE THREE TIMES DAILY   brimonidine (ALPHAGAN) 0.2 % ophthalmic solution Place 1 drop into both eyes 2 (two) times daily.   calcitRIOL (ROCALTROL) 0.25 MCG capsule TAKE 1 CAPSULE (0.25 MCG TOTAL) EVERY MONDAY, WEDNESDAY, AND FRIDAY   carvedilol (COREG) 12.5 MG tablet TAKE 1 TABLET (12.5 MG TOTAL) BY MOUTH 2 (TWO) TIMES DAILY WITH  A MEAL.   DROPLET INSULIN SYRINGE 31G X 5/16" 0.5 ML MISC USE FOUR TIMES DAILY FOR INJECTIONS   furosemide (LASIX) 40 MG tablet Take 40 mg by mouth 2 (two) times daily.   hydrALAZINE (APRESOLINE) 50 MG tablet Take 50 mg by mouth 3 (three) times daily.   insulin glargine (LANTUS) 100 UNIT/ML injection Inject 0.1-0.15 mLs (10-15 Units total) into the skin daily. Please schedule appointment before next refill.   insulin regular (NOVOLIN R) 100 units/mL injection Inject 0.05-0.1 mLs (5-10 Units total) into the skin 3 (three) times daily before meals.   Insulin Syringes, Disposable, U-100 0.5 ML MISC 4x daily injections   isosorbide mononitrate (IMDUR) 30 MG 24 hr tablet Make apt BEFORE next refill   isosorbide mononitrate (IMDUR) 30 MG 24 hr tablet Take 30 mg by mouth daily.   Lancet Devices (ACCU-CHEK SOFTCLIX) lancets Fill for 1 month for TID testing. Use as instructed   latanoprost (XALATAN) 0.005 % ophthalmic solution Place 1 drop into both eyes at bedtime.   Multiple Vitamin (MULTIVITAMIN WITH MINERALS) TABS Take 1 tablet by mouth daily.   NEEDLE, DISP, 30 G (BD DISP NEEDLES) 30G X 1/2" MISC For 4x daily injections   VITAMIN D, CHOLECALCIFEROL, PO Take 1 tablet by mouth daily.     Allergies:   Nsaids, Lisinopril, and Peanut-containing drug products   Social History   Tobacco Use   Smoking status: Never   Smokeless tobacco: Never  Vaping Use   Vaping Use: Never used  Substance Use Topics   Alcohol use: No   Drug use: No     Family History: The patient's family history includes Cervical cancer in her sister; Colon cancer (age of onset: 65) in her mother; Congestive Heart Failure in her sister; Diabetes in her brother, mother, and sister; Heart disease in her father and maternal grandmother; Hypertension in her father; Kidney disease in her brother; Sickle cell anemia in her son; Thyroid disease in her son.  ROS:   Please see the history of present illness.    (+) Nausea (+)  Headache (+) Rare palpitations All other systems reviewed and are negative.  EKGs/Labs/Other Studies Reviewed:    The following studies were reviewed today:  Myoview Lexiscan 05/02/21 The left ventricular ejection fraction is normal (55-65%). Nuclear stress EF: 58%. There was no ST segment deviation noted during stress. The study is normal. This is a low risk study. Normal stress nuclear study with no ischemia or infarction.  Gated ejection fraction 58% with normal wall motion  EKG:  EKG is personally reviewed. 12/27/2022:  NSR with PACs. Lower anterior forces compared to prior. 08/03/21: NSR, rate 60 bpm; sinus arrhythmia 04/20/2021: NSR left axis deviation  Recent Labs: No results found for requested labs within last 365 days.   Recent Lipid Panel    Component Value Date/Time   CHOL 134 12/21/2019 1802   TRIG 82 12/21/2019 1802   HDL  49 12/21/2019 1802   CHOLHDL 2.7 12/21/2019 1802   CHOLHDL 3.3 07/13/2014 1520   VLDL 17 07/13/2014 1520   LDLCALC 69 12/21/2019 1802    Physical Exam:    VS:  BP (!) 158/68 (BP Location: Left Arm, Patient Position: Sitting, Cuff Size: Large)   Pulse 66   Ht 5\' 7"  (1.702 m)   Wt 216 lb 12.8 oz (98.3 kg)   SpO2 98%   BMI 33.96 kg/m     Wt Readings from Last 5 Encounters:  12/27/22 216 lb 12.8 oz (98.3 kg)  08/03/21 211 lb (95.7 kg)  05/19/21 224 lb (101.6 kg)  05/02/21 223 lb (101.2 kg)  04/20/21 223 lb (101.2 kg)    Constitutional: No acute distress Eyes: sclera non-icteric, normal conjunctiva and lids ENMT: normal dentition, moist mucous membranes Cardiovascular: regular rhythm, normal rate, 1/6 systolic murmur at RUSB. S1 and S2 normal. Radial pulses normal bilaterally. No jugular venous distention.  Respiratory: clear to auscultation bilaterally GI : normal bowel sounds, soft and nontender. No distention.   MSK: extremities warm, well perfused. No edema NEURO: grossly nonfocal exam, moves all extremities. PSYCH: alert and  oriented x 3, normal mood and affect.   ASSESSMENT:    1. Paroxysmal atrial fibrillation   2. Secondary hypercoagulable state   3. Murmur   4. Coronary artery disease of native artery of native heart with stable angina pectoris   5. H/O non-ST elevation myocardial infarction (NSTEMI)   6. Long term (current) use of anticoagulants   7. Hyperlipidemia, unspecified hyperlipidemia type   8. Primary hypertension   9. Type 2 diabetes mellitus with other specified complication, with long-term current use of insulin   10. Chronic kidney disease (CKD), stage IV (severe)      PLAN:    Chest pain, unspecified type - Plan: EKG 12-Lead Coronary artery disease of native artery of native heart with stable angina pectoris (HCC) H/O non-ST elevation myocardial infarction (NSTEMI) -Lexiscan Myoview normal in 2022 and she has had no significant recurrence of chest pain.  EKG performed shows lower anterior forces than prior.  - will update echo to ensure no change in wall motion with slight change in EKG.  - will characterize murmur as well.  - eliquis for afib in lieu of asa - continue BB  Paroxysmal atrial fibrillation (HCC) Secondary hypercoagulable state (HCC) Long term (current) use of anticoagulants -Overall does not have bothersome palpitations and continues on Eliquis 2.5 mg twice daily, previously decided to be on a reduced dose due to renal dysfunction.  -Takes carvedilol 12.5 mg twice daily  - SR today.   Hyperlipidemia, unspecified hyperlipidemia type -Most recent lipid panel shows an LDL of 69 and triglycerides of 82 HDL of 49.  This is from a lipid panel last year.  I do not see that she is currently on statin therapy.  Would recommend she consider this for secondary prevention of CAD.  Primary hypertension-blood pressure is stable today - continue on carvedilol 12.5 mg BId, Lasix 40 mg twice daily, hydralazine 3 times a day, Imdur 30 mg daily.  No medication changes today.  Total  time of encounter: 25 minutes total time of encounter, including 20 minutes spent in face-to-face patient care on the date of this encounter. This time includes coordination of care and counseling regarding above mentioned problem list. Remainder of non-face-to-face time involved reviewing chart documents/testing relevant to the patient encounter and documentation in the medical record. I have independently reviewed documentation from referring  provider.   Weston Brass, MD, [facility] [place]  [facility]   Shared Decision Making/Informed Consent:     Medication Adjustments/Labs and Tests Ordered: Current medicines are reviewed at length with the patient today.  Concerns regarding medicines are outlined above.   Orders Placed This Encounter  Procedures   EKG 12-Lead   ECHOCARDIOGRAM COMPLETE   No orders of the defined types were placed in this encounter.  Patient Instructions  Medication Instructions:  No Changes In Medications at this time.  *If you need a refill on your cardiac medications before your next appointment, please call your pharmacy*  Lab Work: None Ordered At This Time.  If you have labs (blood work) drawn today and your tests are completely normal, you will receive your results only by: MyChart Message (if you have MyChart) OR A paper copy in the mail If you have any lab test that is abnormal or we need to change your treatment, we will call you to review the results.  Testing/Procedures: Your physician has requested that you have an echocardiogram. Echocardiography is a painless test that uses sound waves to create images of your heart. It provides your doctor with information about the size and shape of your heart and how well your heart's chambers and valves are working. You may receive an ultrasound enhancing agent through an IV if needed to better visualize your heart during the echo.This procedure takes approximately one hour. There are no restrictions for  this procedure. This will take place at the [address], [address].    Follow-Up: At [facility], you and your health needs are our priority.  As part of our continuing mission to provide you with exceptional heart care, we have created designated Provider Care Teams.  These Care Teams include your primary Cardiologist (physician) and Advanced Practice Providers (APPs -  Physician Assistants and Nurse Practitioners) who all work together to provide you with the care you need, when you need it.  Your next appointment:   1 year(s)  Provider:   Parke Poisson, MD       [facility],acting as a scribe for Parke Poisson, MD.,have documented all relevant documentation on the behalf of Parke Poisson, MD,as directed by  Parke Poisson, MD while in the presence of Parke Poisson, MD.  I, Parke Poisson, MD, have reviewed all documentation for this visit. The documentation on 01/07/23 for the exam, diagnosis, procedures, and orders are all accurate and complete.

## 2022-12-27 ENCOUNTER — Encounter: Payer: Self-pay | Admitting: Internal Medicine

## 2022-12-27 ENCOUNTER — Ambulatory Visit: Payer: Medicare HMO | Attending: Internal Medicine | Admitting: Internal Medicine

## 2022-12-27 VITALS — BP 158/68 | HR 66 | Ht 67.0 in | Wt 216.8 lb

## 2022-12-27 DIAGNOSIS — Z794 Long term (current) use of insulin: Secondary | ICD-10-CM

## 2022-12-27 DIAGNOSIS — I480 Paroxysmal atrial fibrillation: Secondary | ICD-10-CM | POA: Diagnosis not present

## 2022-12-27 DIAGNOSIS — I25118 Atherosclerotic heart disease of native coronary artery with other forms of angina pectoris: Secondary | ICD-10-CM | POA: Diagnosis not present

## 2022-12-27 DIAGNOSIS — I10 Essential (primary) hypertension: Secondary | ICD-10-CM

## 2022-12-27 DIAGNOSIS — D6869 Other thrombophilia: Secondary | ICD-10-CM | POA: Diagnosis not present

## 2022-12-27 DIAGNOSIS — I252 Old myocardial infarction: Secondary | ICD-10-CM

## 2022-12-27 DIAGNOSIS — E785 Hyperlipidemia, unspecified: Secondary | ICD-10-CM

## 2022-12-27 DIAGNOSIS — E1169 Type 2 diabetes mellitus with other specified complication: Secondary | ICD-10-CM

## 2022-12-27 DIAGNOSIS — R011 Cardiac murmur, unspecified: Secondary | ICD-10-CM | POA: Diagnosis not present

## 2022-12-27 DIAGNOSIS — N184 Chronic kidney disease, stage 4 (severe): Secondary | ICD-10-CM

## 2022-12-27 DIAGNOSIS — Z7901 Long term (current) use of anticoagulants: Secondary | ICD-10-CM

## 2022-12-27 LAB — HM HEPATITIS C SCREENING LAB: HM Hepatitis Screen: NEGATIVE

## 2022-12-27 LAB — LAB REPORT - SCANNED
Albumin, Urine POC: 66.1
Albumin/Creatinine Ratio, Urine, POC: 1786
Creatinine, POC: 37 mg/dL
EGFR: 10

## 2022-12-27 NOTE — Patient Instructions (Signed)
Medication Instructions:  No Changes In Medications at this time.  *If you need a refill on your cardiac medications before your next appointment, please call your pharmacy*  Lab Work: None Ordered At This Time.  If you have labs (blood work) drawn today and your tests are completely normal, you will receive your results only by: MyChart Message (if you have MyChart) OR A paper copy in the mail If you have any lab test that is abnormal or we need to change your treatment, we will call you to review the results.  Testing/Procedures: Your physician has requested that you have an echocardiogram. Echocardiography is a painless test that uses sound waves to create images of your heart. It provides your doctor with information about the size and shape of your heart and how well your heart's chambers and valves are working. You may receive an ultrasound enhancing agent through an IV if needed to better visualize your heart during the echo.This procedure takes approximately one hour. There are no restrictions for this procedure. This will take place at the [address], [address].   Follow-Up: At [facility], you and your health needs are our priority.  As part of our continuing mission to provide you with exceptional heart care, we have created designated Provider Care Teams.  These Care Teams include your primary Cardiologist (physician) and Advanced Practice Providers (APPs -  Physician Assistants and Nurse Practitioners) who all work together to provide you with the care you need, when you need it.  Your next appointment:   1 year(s)  Provider:   Gayatri A Acharya, MD    

## 2023-01-30 ENCOUNTER — Encounter (HOSPITAL_COMMUNITY): Payer: Self-pay | Admitting: *Deleted

## 2023-02-06 ENCOUNTER — Ambulatory Visit
Admission: RE | Admit: 2023-02-06 | Discharge: 2023-02-06 | Disposition: A | Discharge status: Home / Self Care | Payer: Medicare PPO | Source: Ambulatory Visit | Attending: Family | Admitting: Family

## 2023-02-06 DIAGNOSIS — Z1231 Encounter for screening mammogram for malignant neoplasm of breast: Secondary | ICD-10-CM

## 2023-02-21 ENCOUNTER — Ambulatory Visit (HOSPITAL_COMMUNITY): Payer: Medicare HMO

## 2023-03-27 ENCOUNTER — Ambulatory Visit (HOSPITAL_COMMUNITY): Payer: Medicare PPO | Attending: Internal Medicine

## 2023-03-27 DIAGNOSIS — I480 Paroxysmal atrial fibrillation: Secondary | ICD-10-CM | POA: Insufficient documentation

## 2023-03-27 DIAGNOSIS — R011 Cardiac murmur, unspecified: Secondary | ICD-10-CM | POA: Diagnosis not present

## 2023-03-27 DIAGNOSIS — R9431 Abnormal electrocardiogram [ECG] [EKG]: Secondary | ICD-10-CM

## 2023-03-27 LAB — ECHOCARDIOGRAM COMPLETE
Area-P 1/2: 3.42 cm2
S' Lateral: 2.8 cm

## 2023-04-10 LAB — LAB REPORT - SCANNED: eGFR: 9

## 2023-04-19 ENCOUNTER — Other Ambulatory Visit (HOSPITAL_COMMUNITY): Payer: Self-pay | Admitting: *Deleted

## 2023-04-23 ENCOUNTER — Inpatient Hospital Stay (HOSPITAL_COMMUNITY): Admission: RE | Admit: 2023-04-23 | Payer: Medicare PPO | Source: Ambulatory Visit

## 2023-05-01 ENCOUNTER — Encounter (HOSPITAL_COMMUNITY): Payer: Medicare PPO

## 2023-05-07 ENCOUNTER — Telehealth: Payer: Self-pay | Admitting: Internal Medicine

## 2023-05-07 DIAGNOSIS — I25118 Atherosclerotic heart disease of native coronary artery with other forms of angina pectoris: Secondary | ICD-10-CM

## 2023-05-07 DIAGNOSIS — Z01810 Encounter for preprocedural cardiovascular examination: Secondary | ICD-10-CM

## 2023-05-07 NOTE — Telephone Encounter (Signed)
Called pt back per provider to obtain more information. No new symptoms at all. She is trying to get into [facility] and an updated stress test is one of there requirements. Pt verbalized understanding.

## 2023-05-07 NOTE — Telephone Encounter (Signed)
Patient states stress needed. Advised patient would send message to provider.  Records from [facility] are in patient chart. Please advise

## 2023-05-07 NOTE — Telephone Encounter (Signed)
Pt called and stated that [facility] told her that she needed another Stress Test.  She would like for Dr to order stress test.      [phone] -

## 2023-05-10 NOTE — Telephone Encounter (Signed)
Left message letting pt know what order was placed for lexiscan and our scheduling team will call to set that up. Left callback number for pt if there are questions or concerns.

## 2023-05-13 ENCOUNTER — Telehealth (HOSPITAL_COMMUNITY): Payer: Self-pay | Admitting: *Deleted

## 2023-05-13 NOTE — Telephone Encounter (Signed)
Patient given detailed instructions per Myocardial Perfusion Study Information Sheet for the test on 05/16/23 Patient notified to arrive 15 minutes early and that it is imperative to arrive on time for appointment to keep from having the test rescheduled.  If you need to cancel or reschedule your appointment, please call the office within 24 hours of your appointment. . Patient verbalized understanding.[name]

## 2023-05-16 ENCOUNTER — Ambulatory Visit (HOSPITAL_COMMUNITY): Payer: Medicare PPO | Attending: Internal Medicine

## 2023-05-16 DIAGNOSIS — Z01818 Encounter for other preprocedural examination: Secondary | ICD-10-CM

## 2023-05-16 DIAGNOSIS — Z01810 Encounter for preprocedural cardiovascular examination: Secondary | ICD-10-CM | POA: Diagnosis not present

## 2023-05-16 DIAGNOSIS — I25118 Atherosclerotic heart disease of native coronary artery with other forms of angina pectoris: Secondary | ICD-10-CM | POA: Diagnosis present

## 2023-05-16 LAB — MYOCARDIAL PERFUSION IMAGING
LV dias vol: 101 mL (ref 46–106)
LV sys vol: 35 mL
Nuc Stress EF: 66 %
Peak HR: 73 {beats}/min
Rest HR: 62 {beats}/min
Rest Nuclear Isotope Dose: 10.9 mCi
SDS: 6
SRS: 0
SSS: 6
ST Depression (mm): 0 mm
Stress Nuclear Isotope Dose: 31.2 mCi
TID: 1.02

## 2023-05-16 MED ORDER — TECHNETIUM TC 99M TETROFOSMIN IV KIT
31.2000 | PACK | Freq: Once | INTRAVENOUS | Status: AC | PRN
  Administered 2023-05-16: 31.2 via INTRAVENOUS

## 2023-05-16 MED ORDER — TECHNETIUM TC 99M TETROFOSMIN IV KIT
10.9000 | PACK | Freq: Once | INTRAVENOUS | Status: AC | PRN
  Administered 2023-05-16: 10.9 via INTRAVENOUS

## 2023-05-16 MED ORDER — REGADENOSON 0.4 MG/5ML IV SOLN
0.4000 mg | Freq: Once | INTRAVENOUS | Status: AC
Start: 2023-05-16 — End: 2023-05-16
  Administered 2023-05-16: 0.4 mg via INTRAVENOUS

## 2023-10-27 ENCOUNTER — Other Ambulatory Visit: Payer: Self-pay

## 2023-10-27 ENCOUNTER — Emergency Department (HOSPITAL_COMMUNITY): Payer: Medicare PPO

## 2023-10-27 ENCOUNTER — Encounter (HOSPITAL_COMMUNITY): Payer: Self-pay

## 2023-10-27 ENCOUNTER — Inpatient Hospital Stay (HOSPITAL_COMMUNITY)
Admission: EM | Admit: 2023-10-27 | Discharge: 2023-11-25 | DRG: 329 | Disposition: A | Discharge status: Skilled Nursing Facility | Payer: Medicare PPO | Attending: Internal Medicine | Admitting: Internal Medicine

## 2023-10-27 DIAGNOSIS — Z833 Family history of diabetes mellitus: Secondary | ICD-10-CM

## 2023-10-27 DIAGNOSIS — I480 Paroxysmal atrial fibrillation: Secondary | ICD-10-CM | POA: Diagnosis present

## 2023-10-27 DIAGNOSIS — Z860100 Personal history of colon polyps, unspecified: Secondary | ICD-10-CM

## 2023-10-27 DIAGNOSIS — Z832 Family history of diseases of the blood and blood-forming organs and certain disorders involving the immune mechanism: Secondary | ICD-10-CM

## 2023-10-27 DIAGNOSIS — K5720 Diverticulitis of large intestine with perforation and abscess without bleeding: Principal | ICD-10-CM | POA: Diagnosis present

## 2023-10-27 DIAGNOSIS — K567 Ileus, unspecified: Secondary | ICD-10-CM | POA: Diagnosis not present

## 2023-10-27 DIAGNOSIS — M159 Polyosteoarthritis, unspecified: Secondary | ICD-10-CM | POA: Diagnosis present

## 2023-10-27 DIAGNOSIS — E1169 Type 2 diabetes mellitus with other specified complication: Secondary | ICD-10-CM | POA: Diagnosis not present

## 2023-10-27 DIAGNOSIS — R739 Hyperglycemia, unspecified: Secondary | ICD-10-CM

## 2023-10-27 DIAGNOSIS — E1151 Type 2 diabetes mellitus with diabetic peripheral angiopathy without gangrene: Secondary | ICD-10-CM | POA: Diagnosis present

## 2023-10-27 DIAGNOSIS — K650 Generalized (acute) peritonitis: Secondary | ICD-10-CM | POA: Diagnosis present

## 2023-10-27 DIAGNOSIS — Z1152 Encounter for screening for COVID-19: Secondary | ICD-10-CM

## 2023-10-27 DIAGNOSIS — D631 Anemia in chronic kidney disease: Secondary | ICD-10-CM | POA: Diagnosis present

## 2023-10-27 DIAGNOSIS — N185 Chronic kidney disease, stage 5: Secondary | ICD-10-CM | POA: Insufficient documentation

## 2023-10-27 DIAGNOSIS — D62 Acute posthemorrhagic anemia: Secondary | ICD-10-CM | POA: Diagnosis not present

## 2023-10-27 DIAGNOSIS — Z515 Encounter for palliative care: Secondary | ICD-10-CM | POA: Diagnosis not present

## 2023-10-27 DIAGNOSIS — K658 Other peritonitis: Secondary | ICD-10-CM | POA: Diagnosis present

## 2023-10-27 DIAGNOSIS — Z6835 Body mass index (BMI) 35.0-35.9, adult: Secondary | ICD-10-CM

## 2023-10-27 DIAGNOSIS — D649 Anemia, unspecified: Secondary | ICD-10-CM

## 2023-10-27 DIAGNOSIS — Z8249 Family history of ischemic heart disease and other diseases of the circulatory system: Secondary | ICD-10-CM

## 2023-10-27 DIAGNOSIS — Z992 Dependence on renal dialysis: Secondary | ICD-10-CM

## 2023-10-27 DIAGNOSIS — E11319 Type 2 diabetes mellitus with unspecified diabetic retinopathy without macular edema: Secondary | ICD-10-CM | POA: Diagnosis present

## 2023-10-27 DIAGNOSIS — N179 Acute kidney failure, unspecified: Secondary | ICD-10-CM | POA: Diagnosis present

## 2023-10-27 DIAGNOSIS — E785 Hyperlipidemia, unspecified: Secondary | ICD-10-CM | POA: Diagnosis present

## 2023-10-27 DIAGNOSIS — E66811 Obesity, class 1: Secondary | ICD-10-CM | POA: Diagnosis present

## 2023-10-27 DIAGNOSIS — Z886 Allergy status to analgesic agent status: Secondary | ICD-10-CM

## 2023-10-27 DIAGNOSIS — Z800 Family history of malignant neoplasm of digestive organs: Secondary | ICD-10-CM

## 2023-10-27 DIAGNOSIS — I2510 Atherosclerotic heart disease of native coronary artery without angina pectoris: Secondary | ICD-10-CM | POA: Diagnosis present

## 2023-10-27 DIAGNOSIS — E872 Acidosis, unspecified: Secondary | ICD-10-CM | POA: Diagnosis present

## 2023-10-27 DIAGNOSIS — E039 Hypothyroidism, unspecified: Secondary | ICD-10-CM | POA: Diagnosis present

## 2023-10-27 DIAGNOSIS — Z604 Social exclusion and rejection: Secondary | ICD-10-CM | POA: Diagnosis present

## 2023-10-27 DIAGNOSIS — N186 End stage renal disease: Secondary | ICD-10-CM | POA: Diagnosis present

## 2023-10-27 DIAGNOSIS — E1140 Type 2 diabetes mellitus with diabetic neuropathy, unspecified: Secondary | ICD-10-CM | POA: Diagnosis present

## 2023-10-27 DIAGNOSIS — R4589 Other symptoms and signs involving emotional state: Secondary | ICD-10-CM | POA: Diagnosis not present

## 2023-10-27 DIAGNOSIS — Z794 Long term (current) use of insulin: Secondary | ICD-10-CM

## 2023-10-27 DIAGNOSIS — K5780 Diverticulitis of intestine, part unspecified, with perforation and abscess without bleeding: Secondary | ICD-10-CM | POA: Diagnosis not present

## 2023-10-27 DIAGNOSIS — M79661 Pain in right lower leg: Secondary | ICD-10-CM | POA: Diagnosis not present

## 2023-10-27 DIAGNOSIS — Z91010 Allergy to peanuts: Secondary | ICD-10-CM

## 2023-10-27 DIAGNOSIS — Z79899 Other long term (current) drug therapy: Secondary | ICD-10-CM

## 2023-10-27 DIAGNOSIS — K5732 Diverticulitis of large intestine without perforation or abscess without bleeding: Secondary | ICD-10-CM | POA: Diagnosis present

## 2023-10-27 DIAGNOSIS — Z90711 Acquired absence of uterus with remaining cervical stump: Secondary | ICD-10-CM

## 2023-10-27 DIAGNOSIS — I252 Old myocardial infarction: Secondary | ICD-10-CM | POA: Diagnosis not present

## 2023-10-27 DIAGNOSIS — E1122 Type 2 diabetes mellitus with diabetic chronic kidney disease: Secondary | ICD-10-CM | POA: Diagnosis present

## 2023-10-27 DIAGNOSIS — Z7189 Other specified counseling: Secondary | ICD-10-CM | POA: Diagnosis not present

## 2023-10-27 DIAGNOSIS — Z841 Family history of disorders of kidney and ureter: Secondary | ICD-10-CM

## 2023-10-27 DIAGNOSIS — Z8049 Family history of malignant neoplasm of other genital organs: Secondary | ICD-10-CM

## 2023-10-27 DIAGNOSIS — I120 Hypertensive chronic kidney disease with stage 5 chronic kidney disease or end stage renal disease: Secondary | ICD-10-CM | POA: Diagnosis present

## 2023-10-27 DIAGNOSIS — Z635 Disruption of family by separation and divorce: Secondary | ICD-10-CM

## 2023-10-27 DIAGNOSIS — Z66 Do not resuscitate: Secondary | ICD-10-CM | POA: Diagnosis present

## 2023-10-27 DIAGNOSIS — Z8673 Personal history of transient ischemic attack (TIA), and cerebral infarction without residual deficits: Secondary | ICD-10-CM

## 2023-10-27 DIAGNOSIS — Z888 Allergy status to other drugs, medicaments and biological substances status: Secondary | ICD-10-CM

## 2023-10-27 DIAGNOSIS — Z91158 Patient's noncompliance with renal dialysis for other reason: Secondary | ICD-10-CM

## 2023-10-27 DIAGNOSIS — I10 Essential (primary) hypertension: Secondary | ICD-10-CM | POA: Diagnosis not present

## 2023-10-27 DIAGNOSIS — Z7901 Long term (current) use of anticoagulants: Secondary | ICD-10-CM

## 2023-10-27 LAB — LIPASE, BLOOD: Lipase: 26 U/L (ref 11–51)

## 2023-10-27 LAB — CBC WITH DIFFERENTIAL/PLATELET
Abs Immature Granulocytes: 0.13 10*3/uL — ABNORMAL HIGH (ref 0.00–0.07)
Basophils Absolute: 0 10*3/uL (ref 0.0–0.1)
Basophils Relative: 0 %
Eosinophils Absolute: 0 10*3/uL (ref 0.0–0.5)
Eosinophils Relative: 0 %
HCT: 31.5 % — ABNORMAL LOW (ref 36.0–46.0)
Hemoglobin: 9.8 g/dL — ABNORMAL LOW (ref 12.0–15.0)
Immature Granulocytes: 1 %
Lymphocytes Relative: 3 %
Lymphs Abs: 0.5 10*3/uL — ABNORMAL LOW (ref 0.7–4.0)
MCH: 25.8 pg — ABNORMAL LOW (ref 26.0–34.0)
MCHC: 31.1 g/dL (ref 30.0–36.0)
MCV: 82.9 fL (ref 80.0–100.0)
Monocytes Absolute: 0.9 10*3/uL (ref 0.1–1.0)
Monocytes Relative: 5 %
Neutro Abs: 15.2 10*3/uL — ABNORMAL HIGH (ref 1.7–7.7)
Neutrophils Relative %: 91 %
Platelets: 435 10*3/uL — ABNORMAL HIGH (ref 150–400)
RBC: 3.8 MIL/uL — ABNORMAL LOW (ref 3.87–5.11)
RDW: 14.9 % (ref 11.5–15.5)
WBC: 16.8 10*3/uL — ABNORMAL HIGH (ref 4.0–10.5)
nRBC: 0 % (ref 0.0–0.2)

## 2023-10-27 LAB — RESP PANEL BY RT-PCR (RSV, FLU A&B, COVID)  RVPGX2
Influenza A by PCR: NEGATIVE
Influenza B by PCR: NEGATIVE
Resp Syncytial Virus by PCR: NEGATIVE
SARS Coronavirus 2 by RT PCR: NEGATIVE

## 2023-10-27 LAB — COMPREHENSIVE METABOLIC PANEL
ALT: 9 U/L (ref 0–44)
AST: 17 U/L (ref 15–41)
Albumin: 3.4 g/dL — ABNORMAL LOW (ref 3.5–5.0)
Alkaline Phosphatase: 44 U/L (ref 38–126)
Anion gap: 16 — ABNORMAL HIGH (ref 5–15)
BUN: 77 mg/dL — ABNORMAL HIGH (ref 8–23)
CO2: 18 mmol/L — ABNORMAL LOW (ref 22–32)
Calcium: 8.4 mg/dL — ABNORMAL LOW (ref 8.9–10.3)
Chloride: 100 mmol/L (ref 98–111)
Creatinine, Ser: 7.05 mg/dL — ABNORMAL HIGH (ref 0.44–1.00)
GFR, Estimated: 6 mL/min — ABNORMAL LOW (ref >60)
Glucose, Bld: 234 mg/dL — ABNORMAL HIGH (ref 70–99)
Potassium: 4.8 mmol/L (ref 3.5–5.1)
Sodium: 134 mmol/L — ABNORMAL LOW (ref 135–145)
Total Bilirubin: 1 mg/dL (ref 0.0–1.2)
Total Protein: 7.3 g/dL (ref 6.5–8.1)

## 2023-10-27 LAB — LACTIC ACID, PLASMA: Lactic Acid, Venous: 1 mmol/L (ref 0.5–1.9)

## 2023-10-27 MED ORDER — HYDROMORPHONE HCL 1 MG/ML IJ SOLN
0.5000 mg | INTRAMUSCULAR | Status: DC | PRN
  Administered 2023-10-30 – 2023-10-31 (×4): 0.5 mg via INTRAVENOUS
  Filled 2023-10-27 (×4): qty 0.5

## 2023-10-27 MED ORDER — INSULIN ASPART 100 UNIT/ML IJ SOLN
0.0000 [IU] | Freq: Three times a day (TID) | INTRAMUSCULAR | Status: DC
  Administered 2023-10-28: 1 [IU] via SUBCUTANEOUS
  Administered 2023-10-28: 3 [IU] via SUBCUTANEOUS
  Administered 2023-10-28: 2 [IU] via SUBCUTANEOUS
  Administered 2023-10-29 (×2): 1 [IU] via SUBCUTANEOUS
  Administered 2023-10-30: 2 [IU] via SUBCUTANEOUS
  Administered 2023-10-30: 1 [IU] via SUBCUTANEOUS
  Administered 2023-10-30: 2 [IU] via SUBCUTANEOUS
  Administered 2023-10-31: 1 [IU] via SUBCUTANEOUS
  Administered 2023-11-01: 2 [IU] via SUBCUTANEOUS

## 2023-10-27 MED ORDER — SODIUM CHLORIDE 0.9% FLUSH
3.0000 mL | Freq: Two times a day (BID) | INTRAVENOUS | Status: DC
  Administered 2023-10-27 – 2023-10-31 (×9): 3 mL via INTRAVENOUS

## 2023-10-27 MED ORDER — ACETAMINOPHEN 10 MG/ML IV SOLN: 1000.0000 mg | Freq: Four times a day (QID) | INTRAVENOUS | Status: AC | PRN

## 2023-10-27 MED ORDER — SODIUM CHLORIDE 0.9 % IV SOLN: INTRAVENOUS | Status: AC

## 2023-10-27 MED ORDER — INSULIN GLARGINE-YFGN 100 UNIT/ML ~~LOC~~ SOLN
8.0000 [IU] | Freq: Every day | SUBCUTANEOUS | Status: DC
  Filled 2023-10-27: qty 0.08

## 2023-10-27 MED ORDER — MORPHINE SULFATE (PF) 4 MG/ML IV SOLN
4.0000 mg | Freq: Once | INTRAVENOUS | Status: AC
Start: 2023-10-27 — End: 2023-10-27
  Administered 2023-10-27: 4 mg via INTRAVENOUS
  Filled 2023-10-27: qty 1

## 2023-10-27 MED ORDER — MELATONIN 3 MG PO TABS
6.0000 mg | ORAL_TABLET | Freq: Every day | ORAL | Status: DC
  Administered 2023-10-27: 6 mg via ORAL
  Filled 2023-10-27: qty 2

## 2023-10-27 MED ORDER — SODIUM CHLORIDE 0.9 % IV SOLN
1.0000 g | INTRAVENOUS | Status: DC
  Administered 2023-10-28 – 2023-10-29 (×2): 1 g via INTRAVENOUS
  Filled 2023-10-27 (×2): qty 10

## 2023-10-27 MED ORDER — METRONIDAZOLE 500 MG/100ML IV SOLN
500.0000 mg | Freq: Two times a day (BID) | INTRAVENOUS | Status: DC
  Administered 2023-10-28 – 2023-10-29 (×3): 500 mg via INTRAVENOUS
  Filled 2023-10-27 (×3): qty 100

## 2023-10-27 MED ORDER — HYDROMORPHONE HCL 1 MG/ML IJ SOLN: 0.5000 mg | INTRAMUSCULAR | Status: DC | PRN

## 2023-10-27 MED ORDER — LATANOPROST 0.005 % OP SOLN
1.0000 [drp] | Freq: Every day | OPHTHALMIC | Status: DC
  Administered 2023-10-27 – 2023-10-31 (×5): 1 [drp] via OPHTHALMIC
  Filled 2023-10-27 (×2): qty 2.5

## 2023-10-27 MED ORDER — FENTANYL CITRATE PF 50 MCG/ML IJ SOSY
25.0000 ug | PREFILLED_SYRINGE | Freq: Once | INTRAMUSCULAR | Status: AC
Start: 2023-10-27 — End: 2023-10-27
  Administered 2023-10-27: 25 ug via INTRAVENOUS
  Filled 2023-10-27: qty 1

## 2023-10-27 MED ORDER — SODIUM CHLORIDE 0.9 % IV BOLUS
1000.0000 mL | Freq: Once | INTRAVENOUS | Status: AC
  Administered 2023-10-27: 1000 mL via INTRAVENOUS

## 2023-10-27 MED ORDER — PIPERACILLIN-TAZOBACTAM 3.375 G IVPB 30 MIN
3.3750 g | Freq: Once | INTRAVENOUS | Status: AC
  Administered 2023-10-27: 3.375 g via INTRAVENOUS
  Filled 2023-10-27: qty 50

## 2023-10-27 MED ORDER — HYDROMORPHONE HCL 1 MG/ML IJ SOLN
1.0000 mg | INTRAMUSCULAR | Status: DC | PRN
  Administered 2023-10-28 – 2023-11-01 (×10): 1 mg via INTRAVENOUS
  Filled 2023-10-27 (×10): qty 1

## 2023-10-27 MED ORDER — INSULIN GLARGINE-YFGN 100 UNIT/ML ~~LOC~~ SOLN
5.0000 [IU] | Freq: Every day | SUBCUTANEOUS | Status: DC
  Administered 2023-10-28: 5 [IU] via SUBCUTANEOUS
  Filled 2023-10-27 (×3): qty 0.05

## 2023-10-27 MED ORDER — ONDANSETRON HCL 4 MG/2ML IJ SOLN
4.0000 mg | Freq: Once | INTRAMUSCULAR | Status: AC
  Administered 2023-10-27: 4 mg via INTRAVENOUS
  Filled 2023-10-27: qty 2

## 2023-10-27 MED ORDER — PIPERACILLIN-TAZOBACTAM IN DEX 2-0.25 GM/50ML IV SOLN
2.2500 g | Freq: Three times a day (TID) | INTRAVENOUS | Status: DC
  Filled 2023-10-27: qty 50

## 2023-10-27 MED ORDER — SODIUM CHLORIDE 0.9 % IV BOLUS
500.0000 mL | Freq: Once | INTRAVENOUS | Status: AC
  Administered 2023-10-27: 500 mL via INTRAVENOUS

## 2023-10-27 NOTE — H&P (Addendum)
History and Physical    [name]:[id number] DOB: 1956-02-10 DOA: 10/27/2023  PCP: Hillery Aldo, NP   Patient coming from: Home   Chief Complaint:  Chief Complaint  Patient presents with   Abdominal Pain   Emesis    HPI:  [name] is a 68 y.o. female with hx of CKD 5 following with [facility] for evaluation for renal transplant, atrial fibrillation on anticoagulation, CAD, NSTEMI, PAD, CVA, diabetes type 2, hypertension, hyperlipidemia, anemia, who was brought in by EMS for acute onset left lower quadrant abdominal pain.  Reports onset of symptoms yesterday.  Associated with diarrhea, nausea but no vomiting.  Decreased oral intake.  Her abdominal pain is worsened with time.  No prior episodes of diverticulitis in the past. She has a history of partial right colectomy for polyps reported noncancerous, remote history of partial hysterectomy.   Review of Systems:  ROS complete and negative except as marked above   Allergies  Allergen Reactions   Nsaids Other (See Comments)    CKD stage 4   Lisinopril Other (See Comments)    coughing   Peanut-Containing Drug Products Itching and Other (See Comments)    GI intolerance - diarrhea    Prior to Admission medications   Medication Sig Start Date End Date Taking? Authorizing Provider  amLODipine (NORVASC) 5 MG tablet Take 5 mg by mouth daily. 07/29/21  Yes [provider]  apixaban (ELIQUIS) 2.5 MG TABS tablet Take 1 tablet (2.5 mg total) by mouth 2 (two) times daily. Patient taking differently: Take 1.25 mg by mouth 2 (two) times daily. 10/04/22  Yes Parke Poisson, MD  Ascorbic Acid (VITAMIN C PO) Take 1 tablet by mouth daily.   Yes [provider]  Blood Glucose Monitoring Suppl (ACCU-CHEK AVIVA PLUS) w/Device KIT CHECK BLOOD GLUCOSE THREE TIMES DAILY 10/26/20  Yes Autry-Lott, Randa Evens, DO  calcitRIOL (ROCALTROL) 0.25 MCG capsule TAKE 1 CAPSULE (0.25 MCG TOTAL) EVERY MONDAY, WEDNESDAY, AND FRIDAY Patient  taking differently: Take 0.25 mcg by mouth in the morning and at bedtime. 06/22/22  Yes Levin Erp, MD  carvedilol (COREG) 12.5 MG tablet TAKE 1 TABLET (12.5 MG TOTAL) BY MOUTH 2 (TWO) TIMES DAILY WITH A MEAL. 12/22/21  Yes Autry-Lott, Randa Evens, DO  furosemide (LASIX) 40 MG tablet Take 40 mg by mouth 2 (two) times daily. 08/12/18  Yes [provider]  hydrALAZINE (APRESOLINE) 50 MG tablet Take 50 mg by mouth 3 (three) times daily. 09/29/18  Yes [provider]  insulin glargine (LANTUS) 100 UNIT/ML injection Inject 0.1-0.15 mLs (10-15 Units total) into the skin daily. Please schedule appointment before next refill. 04/05/22  Yes Erick Alley, DO  insulin regular (NOVOLIN R) 100 units/mL injection Inject 0.05-0.1 mLs (5-10 Units total) into the skin 3 (three) times daily before meals. 10/26/20  Yes Autry-Lott, Randa Evens, DO  isosorbide mononitrate (IMDUR) 30 MG 24 hr tablet Make apt BEFORE next refill Patient taking differently: Take 30 mg by mouth daily. Make apt BEFORE next refill 11/23/22  Yes Erick Alley, DO  latanoprost (XALATAN) 0.005 % ophthalmic solution Place 1 drop into both eyes at bedtime.   Yes [provider]  melatonin 5 MG TABS Take 10 mg by mouth at bedtime as needed (for insomnia).   Yes [provider]  Multiple Vitamin (MULTIVITAMIN WITH MINERALS) TABS Take 1 tablet by mouth daily.   Yes [provider]  VITAMIN D, CHOLECALCIFEROL, PO Take 1 tablet by mouth daily.   Yes [provider]  ACCU-CHEK AVIVA PLUS test strip CHECK BLOOD GLUCOSE THREE TIMES DAILY 11/05/22   Erick Alley, DO  DROPLET INSULIN SYRINGE 31G X 5/16" 0.5 ML MISC USE FOUR TIMES DAILY FOR INJECTIONS 09/03/22   Erick Alley, DO  Insulin Syringes, Disposable, U-100 0.5 ML MISC 4x daily injections 03/21/18   Tillman Sers, DO  Lancet Devices (ACCU-CHEK SOFTCLIX) lancets Fill for 1 month for TID testing. Use as instructed 11/13/13   Leona Singleton, MD  NEEDLE, DISP,  30 G (BD DISP NEEDLES) 30G X 1/2" MISC For 4x daily injections 11/05/15   Pincus Large, DO  REPATHA SURECLICK 140 MG/ML SOAJ Inject 140 mg into the skin every 14 (fourteen) days. Patient not taking: Reported on 10/27/2023 09/16/23   [provider]    Past Medical History:  Diagnosis Date   Anemia    Arthritis    Atrial fibrillation (HCC)    CAD (coronary artery disease)    Chronic kidney disease, stage III (moderate) (HCC)    Constipation    Diabetes mellitus (HCC)    History of blood transfusion    HLD (hyperlipidemia)    Hypertension    Myocardial infarction [facility])    in April 2014    Past Surgical History:  Procedure Laterality Date   ANKLE SURGERY Right    x 6 total   BREAST BIOPSY Left    15 or 20 yearsa ago she cant remember laterality    HARDWARE REMOVAL Right 02/13/2013   Procedure: HARDWARE REMOVAL;  Surgeon: Nadara Mustard, MD;  Location: [facility] OR;  Service: Orthopedics;  Laterality: Right;  Right Ankle Removal Hardware, Debridement, Place Wound VAC   laser surgery for diabetic retinopathy Bilateral    LEFT HEART CATHETERIZATION WITH CORONARY ANGIOGRAM N/A 01/09/2013   Procedure: LEFT HEART CATHETERIZATION WITH CORONARY ANGIOGRAM;  Surgeon: Dolores Patty, MD;  Location: [facility] CATH LAB;  Service: Cardiovascular;  Laterality: N/A;   ORIF ANKLE FRACTURE Right 01/12/2013   Procedure: OPEN REDUCTION INTERNAL FIXATION (ORIF) ANKLE FRACTURE;  Surgeon: Nadara Mustard, MD;  Location: [facility] OR;  Service: Orthopedics;  Laterality: Right;   PARTIAL HYSTERECTOMY       reports that she has never smoked. She has never used smokeless tobacco. She reports that she does not drink alcohol and does not use drugs.  Family History  Problem Relation Age of Onset   Diabetes Mother        No history CAD   Colon cancer Mother 13       died at 23 from CRC   Hypertension Father        Also had CAD   Heart disease Father    Cervical cancer Sister    Diabetes Sister    Congestive  Heart Failure Sister    Diabetes Brother    Kidney disease Brother    Heart disease Maternal Grandmother    Sickle cell anemia Son    Thyroid disease Son      Physical Exam: Vitals:   10/27/23 1912 10/27/23 2200  BP: 112/78   Pulse: 76   Resp: 18   Temp: 99.3 F (37.4 C)   TempSrc: Oral   SpO2: 97%   Weight:  98.3 kg  Height:  5\' 7"  (1.702 m)    Gen: Awake, alert, mild distress from pain CV: Tachycardic, normal S1, S2, 2/6 SEM  resp: Tachypneic, CTAB  Abd: Flat, hypoactive, there is severe tenderness in the central/left hemiabdomen, with guarding, no rigidity, + mild rebound tenderness  MSK: Symmetric, no edema  Skin: No rashes or lesions to exposed skin  Neuro: Alert and interactive  Psych: in pain, appropriate    Data review:   Labs reviewed, notable for:   Bicarb 18, anion gap 16 Creatinine 7, BUN 77 Hyperglycemic to 34 WBC 16, hemoglobin 9, normocytic  Micro:  Results for orders placed or performed during the hospital encounter of 10/27/23  Resp panel by RT-PCR (RSV, Flu A&B, Covid) Anterior Nasal Swab     Status: None   Collection Time: 10/27/23  7:18 PM   Specimen: Anterior Nasal Swab  Result Value Ref Range Status   SARS Coronavirus 2 by RT PCR NEGATIVE NEGATIVE Final   Influenza A by PCR NEGATIVE NEGATIVE Final   Influenza B by PCR NEGATIVE NEGATIVE Final    Comment: (NOTE) The Xpert Xpress SARS-CoV-2/FLU/RSV plus assay is intended as an aid in the diagnosis of influenza from Nasopharyngeal swab specimens and should not be used as a sole basis for treatment. Nasal washings and aspirates are unacceptable for Xpert Xpress SARS-CoV-2/FLU/RSV testing.  Fact Sheet for Patients: BloggerCourse.com  Fact Sheet for Healthcare Providers: SeriousBroker.it  This test is not yet approved or cleared by the [place] FDA and has been authorized for detection and/or diagnosis of SARS-CoV-2 by FDA under an  Emergency Use Authorization (EUA). This EUA will remain in effect (meaning this test can be used) for the duration of the COVID-19 declaration under Section 564(b)(1) of the Act, 21 U.S.C. section 360bbb-3(b)(1), unless the authorization is terminated or revoked.     Resp Syncytial Virus by PCR NEGATIVE NEGATIVE Final    Comment: (NOTE) Fact Sheet for Patients: BloggerCourse.com  Fact Sheet for Healthcare Providers: SeriousBroker.it  This test is not yet approved or cleared by the [place] FDA and has been authorized for detection and/or diagnosis of SARS-CoV-2 by FDA under an Emergency Use Authorization (EUA). This EUA will remain in effect (meaning this test can be used) for the duration of the COVID-19 declaration under Section 564(b)(1) of the Act, 21 U.S.C. section 360bbb-3(b)(1), unless the authorization is terminated or revoked.  Performed at [facility], [address], [place], [place] [address]     Imaging reviewed:  CT ABDOMEN PELVIS WO CONTRAST Result Date: 10/27/2023 CLINICAL DATA:  Acute nonlocalized abdominal pain. Chills and fever. Vomiting and diarrhea. EXAM: CT ABDOMEN AND PELVIS WITHOUT CONTRAST TECHNIQUE: Multidetector CT imaging of the abdomen and pelvis was performed following the standard protocol without IV contrast. RADIATION DOSE REDUCTION: This exam was performed according to the departmental dose-optimization program which includes automated exposure control, adjustment of the mA and/or kV according to patient size and/or use of iterative reconstruction technique. COMPARISON:  CT abdomen and pelvis 04/19/2021 FINDINGS: Lower chest: Bibasilar atelectasis.  No acute abnormality. Hepatobiliary: Cholelithiasis without evidence of acute cholecystitis. No biliary dilation. Unremarkable noncontrast appearance of the liver. Pancreas: Unremarkable. Spleen: Unremarkable. Adrenals/Urinary Tract: Normal adrenal  glands. No urinary calculi or hydronephrosis. Unremarkable bladder. Stomach/Bowel: Stomach is within normal limits. No bowel obstruction. Colonic diverticulosis. Wall thickening, inflammatory stranding, and free fluid about the descending colon. Loosely organized fluid and gas about the inflamed descending colon (circa series 3/image 49-50) compatible with contained perforation. The adjacent jejunum is inflamed. Postoperative change about the colon with ileocolonic anastomosis in the right upper quadrant. Vascular/Lymphatic: Aortic atherosclerosis. No enlarged abdominal or pelvic lymph nodes. Reproductive: No acute abnormality. Other: Fat containing periumbilical hernia. Musculoskeletal: No acute fracture. IMPRESSION: 1. Acute diverticulitis of the descending colon  with contained perforation. 2. Inflamed jejunum adjacent to the perforated diverticulitis. 3.  Aortic Atherosclerosis (ICD10-I70.0). Critical Value/emergent results were called by telephone at the time of interpretation on 10/27/2023 at 9:22 pm to provider [facility] , who verbally acknowledged these results. Electronically Signed   By: Minerva Fester M.D.   On: 10/27/2023 21:24    EKG:  Personally reviewed, sinus rhythm with PVCs.  LAD, no acute ischemic changes  ED Course:  Treated with Zosyn, 500 cc IV fluid, morphine, fentanyl.  EDP discussed case with general surgery Dr. Janee Morn who will consult on the patient in the morning.  Also discussed case with Dr. Signe Colt of nephrology who will consult in the morning.   Assessment/Plan:  68 y.o. female with hx CKD 5 following with [facility] for evaluation for renal transplant, atrial fibrillation on anticoagulation, CAD, NSTEMI, PAD, CVA, diabetes type 2, hypertension, hyperlipidemia, anemia, R hemicolectomy, who was brought in by EMS for acute onset left lower quadrant abdominal pain.  Found to have complicated diverticulitis with contained perforation.  Complicated diverticulitis, contained  perforation Onset symptoms 2/1 with severe progressive LLQ pain, assoc diarrhea. Exam with severe tenderness, guarding, and mild rebound. WBC 16. Lactate pending.  CT abdomen pelvis with wall thickening and stranding, free fluid adjacent to the descending colon, loosely organized fluid and gas in this area compatible with contained perforation, Adjacent jejunal inflammation. - EDP discussed case with general surgery Dr. Janee Morn who will consult on the patient in the morning - S/p Zosyn in the ED, continue cefepime 1 g every 24 hours, Flagyl 500 mg IV every 12 hours. - S/p 500 cc IV fluid.  Given additional 1 L IV fluid, then started on mIVF 50 cc/h; close monitoring for volume overload in setting of her renal insufficiency. - Check lactate - Serial abdominal exam - Holding home Eliquis in case requires surgery (last received 2/2 in the AM)   Progression CKD 5 May have component of prerenal injury with decreased intake and underlying infection.  Creatinine 7 (last in 10/24 was 6), BUN 77.  No other acute electrolyte abnormalities - EDP discussed case with Dr. Signe Colt of nephrology who will consult in the morning. - IV fluids above - Hold her home Lasix - Strict I's and O's, daily weights  Chronic medical problems: A-fib on anticoagulation: Currently sinus.  Hold her home Eliquis per above.  On Coreg, currently held with borderline low pressures /sinus rhythm. CAD, NSTEMI; PAD; CVA; HLD: Management of anticoagulation per above.  To be on Repatha outpatient has not started. Diabetes type 2: Home regimen is glargine 10 units (last took on 1/31), insulin regular 5 to 10 units 3 times daily with meals.  Reduce basal insulin to 5 units daily in the morning, SSI 3 times daily for very sensitive. Hypertension: Borderline low pressures here.  Hold home amlodipine, carvedilol, hydralazine, isosorbide mononitrate Chronic anemia: Hemoglobin near baseline at 9; suspect 2/2 CKD   Body mass index is 33.94  kg/m. Obesity class I    DVT prophylaxis:  SCDs Code Status:  DNR with Intubation; Confirmed with the patient  Diet:  Diet Orders (From admission, onward)     Start     Ordered   10/27/23 2222  Diet NPO time specified Except for: Sips with Meds, Ice Chips  Diet effective now       Question Answer Comment  Except for Sips with Meds   Except for Ice Chips      10/27/23 2224  Family Communication: No   Consults:  General surgery, Nephrology   Admission status:   Inpatient, Step Down Unit  Severity of Illness: The appropriate patient status for this patient is INPATIENT. Inpatient status is judged to be reasonable and necessary in order to provide the required intensity of service to ensure the patient's safety. The patient's presenting symptoms, physical exam findings, and initial radiographic and laboratory data in the context of their chronic comorbidities is felt to place them at high risk for further clinical deterioration. Furthermore, it is not anticipated that the patient will be medically stable for discharge from the hospital within 2 midnights of admission.   * I certify that at the point of admission it is my clinical judgment that the patient will require inpatient hospital care spanning beyond 2 midnights from the point of admission due to high intensity of service, high risk for further deterioration and high frequency of surveillance required.*   Dolly Rias, MD [facility]  How to contact the [facility] Attending or Consulting provider 7A - 7P or covering provider during after hours 7P -7A, for this patient.  Check the care team in [facility] and look for a) attending/consulting [facility] provider listed and b) the [facility] team listed Log into [email or web address] and use [facility] universal password to access. If you do not have the password, please contact the hospital operator. Locate the [facility] provider you are looking for under [facility] and page to a number  that you can be directly reached. If you still have difficulty reaching the provider, please page the [facility] (Director on Call) for the Hospitalists listed on amion for assistance.  10/27/2023, 10:51 PM

## 2023-10-27 NOTE — ED Triage Notes (Signed)
PT BIB EMS from home. Pt complains of abd pain, chills, fever, vomiting and diarrhea x 2 days.

## 2023-10-27 NOTE — ED Provider Notes (Signed)
[place] EMERGENCY DEPARTMENT AT [facility] Provider Note   CSN: [id number] Arrival date & time: 10/27/23  1912     History  Chief Complaint  Patient presents with   Abdominal Pain   Emesis    [name] is a 68 y.o. female history of CKD, currently being evaluated at [facility] for transplant, not on dialysis here for evaluation of fever and abdominal pain.  Symptoms started 2 days ago.  Ejective fever and chills.  Abdominal pain is diffuse in nature, located primarily to the lower abdomen.  She has had some nausea, dry heaving.  Loose stool without any blood.  No dysuria or hematuria however states has had some decrease urine output which she thought was due to decreased p.o. intake.  No sick contacts.  No meds PTA.  She states they have talked with her about dialysis previously however she does not have any dialysis access currently.  States she has had a prior ABD surgery for mass that they were unable to remove via colonoscopy.  Has not passed flatus today.  Patient thinks her last creatinine was 5 about 2 months ago when she was last at [facility].   [facility] transplant team- creatinine 10/24 @6  [facility] [place]  HPI     Home Medications Prior to Admission medications   Medication Sig Start Date End Date Taking? Authorizing Provider  ACCU-CHEK AVIVA PLUS test strip CHECK BLOOD GLUCOSE THREE TIMES DAILY 11/05/22   Erick Alley, DO  amLODipine (NORVASC) 5 MG tablet  07/29/21   [provider]  apixaban (ELIQUIS) 2.5 MG TABS tablet Take 1 tablet (2.5 mg total) by mouth 2 (two) times daily. 10/04/22   Parke Poisson, MD  Ascorbic Acid (VITAMIN C PO) Take 1 tablet by mouth daily.    [provider]  Blood Glucose Monitoring Suppl (ACCU-CHEK AVIVA PLUS) w/Device KIT CHECK BLOOD GLUCOSE THREE TIMES DAILY 10/26/20   Autry-Lott, Randa Evens, DO  brimonidine (ALPHAGAN) 0.2 % ophthalmic solution Place 1 drop into both eyes 2 (two) times daily.  12/20/22   [provider]  calcitRIOL (ROCALTROL) 0.25 MCG capsule TAKE 1 CAPSULE (0.25 MCG TOTAL) EVERY MONDAY, WEDNESDAY, AND FRIDAY 06/22/22   Levin Erp, MD  carvedilol (COREG) 12.5 MG tablet TAKE 1 TABLET (12.5 MG TOTAL) BY MOUTH 2 (TWO) TIMES DAILY WITH A MEAL. 12/22/21   Autry-Lott, Randa Evens, DO  diphenhydramine-acetaminophen (TYLENOL PM) 25-500 MG TABS tablet Take 1 tablet by mouth every 4 (four) hours as needed (pain/sleep). Patient not taking: Reported on 12/27/2022    [provider]  DROPLET INSULIN SYRINGE 31G X 5/16" 0.5 ML MISC USE FOUR TIMES DAILY FOR INJECTIONS 09/03/22   Erick Alley, DO  furosemide (LASIX) 40 MG tablet Take 40 mg by mouth 2 (two) times daily. 08/12/18   [provider]  hydrALAZINE (APRESOLINE) 50 MG tablet Take 50 mg by mouth 3 (three) times daily. 09/29/18   [provider]  insulin glargine (LANTUS) 100 UNIT/ML injection Inject 0.1-0.15 mLs (10-15 Units total) into the skin daily. Please schedule appointment before next refill. 04/05/22   Erick Alley, DO  insulin regular (NOVOLIN R) 100 units/mL injection Inject 0.05-0.1 mLs (5-10 Units total) into the skin 3 (three) times daily before meals. 10/26/20   Autry-Lott, Randa Evens, DO  Insulin Syringes, Disposable, U-100 0.5 ML MISC 4x daily injections 03/21/18   Dolores Patty C, DO  isosorbide mononitrate (IMDUR) 30 MG 24 hr tablet Make apt BEFORE next refill 11/23/22   Erick Alley,  DO  isosorbide mononitrate (IMDUR) 30 MG 24 hr tablet Take 30 mg by mouth daily.    [provider]  Lancet Devices (ACCU-CHEK [facility]) lancets Fill for 1 month for TID testing. Use as instructed 11/13/13   Leona Singleton, MD  latanoprost (XALATAN) 0.005 % ophthalmic solution Place 1 drop into both eyes at bedtime.    [provider]  Multiple Vitamin (MULTIVITAMIN WITH MINERALS) TABS Take 1 tablet by mouth daily.    [provider]  NEEDLE, DISP, 30 G (BD DISP NEEDLES) 30G X  1/2" MISC For 4x daily injections 11/05/15   Caryl Ada Y, DO  VITAMIN D, CHOLECALCIFEROL, PO Take 1 tablet by mouth daily.    [provider]      Allergies    Nsaids, Lisinopril, and Peanut-containing drug products    Review of Systems   Review of Systems  Constitutional:  Positive for activity change, appetite change and fever (subjective).  HENT: Negative.    Respiratory: Negative.    Cardiovascular: Negative.   Gastrointestinal:  Positive for abdominal pain, diarrhea, nausea and vomiting. Negative for abdominal distention, anal bleeding, blood in stool, constipation and rectal pain.  Genitourinary: Negative.   Musculoskeletal: Negative.   Skin: Negative.   Neurological: Negative.   All other systems reviewed and are negative.   Physical Exam Updated Vital Signs BP 112/78 (BP Location: Right Arm)   Pulse 76   Temp 99.3 F (37.4 C) (Oral)   Resp 18   SpO2 97%  Physical Exam Vitals and nursing note reviewed.  Constitutional:      General: She is not in acute distress.    Appearance: She is well-developed. She is not ill-appearing, toxic-appearing or diaphoretic.  HENT:     Head: Normocephalic and atraumatic.  Eyes:     Pupils: Pupils are equal, round, and reactive to light.  Cardiovascular:     Rate and Rhythm: Normal rate.     Heart sounds: Normal heart sounds.  Pulmonary:     Effort: Pulmonary effort is normal. No respiratory distress.     Breath sounds: Normal breath sounds.  Abdominal:     General: Bowel sounds are normal. There is no distension.     Palpations: Abdomen is soft.     Tenderness: There is generalized abdominal tenderness and tenderness in the right lower quadrant, periumbilical area, suprapubic area and left lower quadrant. There is no right CVA tenderness, left CVA tenderness, guarding or rebound. Negative signs include Murphy's sign and McBurney's sign.  Musculoskeletal:        General: Normal range of motion.     Cervical back:  Normal range of motion.  Skin:    General: Skin is warm and dry.  Neurological:     General: No focal deficit present.     Mental Status: She is alert.  Psychiatric:        Mood and Affect: Mood normal.    ED Results / Procedures / Treatments   Labs (all labs ordered are listed, but only abnormal results are displayed) Labs Reviewed  COMPREHENSIVE METABOLIC PANEL - Abnormal; Notable for the following components:      Result Value   Sodium 134 (*)    CO2 18 (*)    Glucose, Bld 234 (*)    BUN 77 (*)    Creatinine, Ser 7.05 (*)    Calcium 8.4 (*)    Albumin 3.4 (*)    GFR, Estimated 6 (*)    Anion gap 16 (*)  All other components within normal limits  CBC WITH DIFFERENTIAL/PLATELET - Abnormal; Notable for the following components:   WBC 16.8 (*)    RBC 3.80 (*)    Hemoglobin 9.8 (*)    HCT 31.5 (*)    MCH 25.8 (*)    Platelets 435 (*)    Neutro Abs 15.2 (*)    Lymphs Abs 0.5 (*)    Abs Immature Granulocytes 0.13 (*)    All other components within normal limits  RESP PANEL BY RT-PCR (RSV, FLU A&B, COVID)  RVPGX2  LIPASE, BLOOD  URINALYSIS, ROUTINE W REFLEX MICROSCOPIC    EKG None  Radiology CT ABDOMEN PELVIS WO CONTRAST Result Date: 10/27/2023 CLINICAL DATA:  Acute nonlocalized abdominal pain. Chills and fever. Vomiting and diarrhea. EXAM: CT ABDOMEN AND PELVIS WITHOUT CONTRAST TECHNIQUE: Multidetector CT imaging of the abdomen and pelvis was performed following the standard protocol without IV contrast. RADIATION DOSE REDUCTION: This exam was performed according to the departmental dose-optimization program which includes automated exposure control, adjustment of the mA and/or kV according to patient size and/or use of iterative reconstruction technique. COMPARISON:  CT abdomen and pelvis 04/19/2021 FINDINGS: Lower chest: Bibasilar atelectasis.  No acute abnormality. Hepatobiliary: Cholelithiasis without evidence of acute cholecystitis. No biliary dilation. Unremarkable  noncontrast appearance of the liver. Pancreas: Unremarkable. Spleen: Unremarkable. Adrenals/Urinary Tract: Normal adrenal glands. No urinary calculi or hydronephrosis. Unremarkable bladder. Stomach/Bowel: Stomach is within normal limits. No bowel obstruction. Colonic diverticulosis. Wall thickening, inflammatory stranding, and free fluid about the descending colon. Loosely organized fluid and gas about the inflamed descending colon (circa series 3/image 49-50) compatible with contained perforation. The adjacent jejunum is inflamed. Postoperative change about the colon with ileocolonic anastomosis in the right upper quadrant. Vascular/Lymphatic: Aortic atherosclerosis. No enlarged abdominal or pelvic lymph nodes. Reproductive: No acute abnormality. Other: Fat containing periumbilical hernia. Musculoskeletal: No acute fracture. IMPRESSION: 1. Acute diverticulitis of the descending colon with contained perforation. 2. Inflamed jejunum adjacent to the perforated diverticulitis. 3.  Aortic Atherosclerosis (ICD10-I70.0). Critical Value/emergent results were called by telephone at the time of interpretation on 10/27/2023 at 9:22 pm to provider [facility] , who verbally acknowledged these results. Electronically Signed   By: Minerva Fester M.D.   On: 10/27/2023 21:24    Procedures .Critical Care  Performed by: Linwood Dibbles, PA-C Authorized by: Linwood Dibbles, PA-C   Critical care provider statement:    Critical care time (minutes):  35   Critical care was necessary to treat or prevent imminent or life-threatening deterioration of the following conditions: perforated colon.   Critical care was time spent personally by me on the following activities:  Development of treatment plan with patient or surrogate, discussions with consultants, evaluation of patient's response to treatment, examination of patient, ordering and review of laboratory studies, ordering and review of radiographic studies, ordering  and performing treatments and interventions, pulse oximetry, re-evaluation of patient's condition and review of old charts     Medications Ordered in ED Medications  sodium chloride 0.9 % bolus 500 mL (has no administration in time range)  piperacillin-tazobactam (ZOSYN) IVPB 3.375 g (has no administration in time range)  morphine (PF) 4 MG/ML injection 4 mg (has no administration in time range)  fentaNYL (SUBLIMAZE) injection 25 mcg (25 mcg Intravenous Given 10/27/23 1941)  ondansetron (ZOFRAN) injection 4 mg (4 mg Intravenous Given 10/27/23 1941)   ED Course/ Medical Decision Making/ A&P Clinical Course as of 10/27/23 2251  Sun Oct 27, 2023  2134 Dr. Janee Morn  with Gen surgery, will see in consult. Agreeable with med admit [BH]  2138 Dr. Signe Colt with Nephrology. Will follow along  [BH]  2209 Dr. Lazarus Salines with medicine [BH]    Clinical Course User Index [BH] Duvid Smalls A, PA-C   68 year old here for evaluation of feeling unwell.  Few days of abdominal pain, chills, loose stool.  Now not passing flatus.  She is being worked up for a kidney transplant at [facility].  Follows with [facility].  On arrival she is afebrile, nonseptic appearing.  Her heart and lungs are clear.  Her abdomen is soft however diffusely tender.  Will plan on labs, imaging and reassess  Labs and imaging personally viewed and interpreted:  CBC leukocytosis 16.8, hemoglobin 9.8, similar to prior Metabolic panel sodium 134, glucose 234, creatinine 7.05, anion gap 16 Lipase 26  COVID, FLU, RSV neg CT abdomen and pelvis shows perforated diverticulitis with contained perforation  Will touch base with general surgery due to perforation, suspect medicine admission.  Will also touch base with nephrology given worsening renal function, unclear baseline given I cannot view her labs from [facility].  Discussed with Dr. Janee Morn with general surgery will follow along Discussed with Dr. Signe Colt with nephrology will follow  along Discussed with Dr. Lazarus Salines who agrees to evaluate patient for admission  Patient critically ill with perforated diverticulitis as well as acute renal failure.  Will be admitted for further workup and intervention  The patient appears reasonably stabilized for admission considering the current resources, flow, and capabilities available in the ED at this time, and I doubt any other [facility] requiring further screening and/or treatment in the ED prior to admission.                                Medical Decision Making Amount and/or Complexity of Data Reviewed External Data Reviewed: labs, radiology, ECG and notes. Labs: ordered. Decision-making details documented in ED Course. Radiology: ordered and independent interpretation performed. Decision-making details documented in ED Course.  Risk OTC drugs. Prescription drug management. Decision regarding hospitalization. Diagnosis or treatment significantly limited by social determinants of health.          Final Clinical Impression(s) / ED Diagnoses Final diagnoses:  Diverticulitis of intestine with perforation without bleeding, unspecified part of intestinal tract  AKI (acute kidney injury) [facility])    Rx / DC Orders ED Discharge Orders     None         Kyrstal Monterrosa A, PA-C 10/27/23 2252    Lonell Grandchild, MD 10/27/23 [phone]

## 2023-10-27 NOTE — ED Provider Triage Note (Signed)
Emergency Medicine Provider Triage Evaluation Note  [name] , a 68 y.o. female  was evaluated in triage.  Pt complains of abdominal pain, no.  She has a history of prior partial bowel restriction.  Review of Systems  Positive: Abdominal pain, nausea Negative: Constipation  Physical Exam  BP 112/78 (BP Location: Right Arm)   Pulse 76   Temp 99.3 F (37.4 C) (Oral)   SpO2 97%  Gen:   Awake, no distress speaking clearly Resp:  Normal effort no creased work of breathing MSK:   Moves extremities without difficulty no deformity Other:  Abdominal exam notable for mild tenderness, no distention, no guarding  Medical Decision Making  Medically screening exam initiated at 7:21 PM.  Appropriate orders placed.  IKEYA BROCKEL was informed that the remainder of the evaluation will be completed by another provider, this initial triage assessment does not replace that evaluation, and the importance of remaining in the ED until their evaluation is complete.   Gerhard Munch, MD 10/27/23 [phone]

## 2023-10-28 ENCOUNTER — Encounter (HOSPITAL_COMMUNITY): Payer: Self-pay | Admitting: Internal Medicine

## 2023-10-28 DIAGNOSIS — K5732 Diverticulitis of large intestine without perforation or abscess without bleeding: Secondary | ICD-10-CM | POA: Diagnosis not present

## 2023-10-28 LAB — URINALYSIS, ROUTINE W REFLEX MICROSCOPIC
Bilirubin Urine: NEGATIVE
Glucose, UA: 150 mg/dL — AB
Hgb urine dipstick: NEGATIVE
Ketones, ur: NEGATIVE mg/dL
Nitrite: NEGATIVE
Protein, ur: >=300 mg/dL — AB
Specific Gravity, Urine: 1.01 (ref 1.005–1.030)
pH: 7 (ref 5.0–8.0)

## 2023-10-28 LAB — CBC
HCT: 27.9 % — ABNORMAL LOW (ref 36.0–46.0)
Hemoglobin: 8.7 g/dL — ABNORMAL LOW (ref 12.0–15.0)
MCH: 26 pg (ref 26.0–34.0)
MCHC: 31.2 g/dL (ref 30.0–36.0)
MCV: 83.3 fL (ref 80.0–100.0)
Platelets: 355 10*3/uL (ref 150–400)
RBC: 3.35 MIL/uL — ABNORMAL LOW (ref 3.87–5.11)
RDW: 14.7 % (ref 11.5–15.5)
WBC: 21.4 10*3/uL — ABNORMAL HIGH (ref 4.0–10.5)
nRBC: 0 % (ref 0.0–0.2)

## 2023-10-28 LAB — HEMOGLOBIN A1C
Hgb A1c MFr Bld: 6.6 % — ABNORMAL HIGH (ref 4.8–5.6)
Mean Plasma Glucose: 142.72 mg/dL

## 2023-10-28 LAB — PROTIME-INR
INR: >10 (ref 0.8–1.2)
Prothrombin Time: 90.1 s — ABNORMAL HIGH (ref 11.4–15.2)

## 2023-10-28 LAB — TYPE AND SCREEN
ABO/RH(D): A POS
Antibody Screen: NEGATIVE

## 2023-10-28 LAB — CBG MONITORING, ED
Glucose-Capillary: 255 mg/dL — ABNORMAL HIGH (ref 70–99)
Glucose-Capillary: 203 mg/dL — ABNORMAL HIGH (ref 70–99)
Glucose-Capillary: 167 mg/dL — ABNORMAL HIGH (ref 70–99)
Glucose-Capillary: 177 mg/dL — ABNORMAL HIGH (ref 70–99)

## 2023-10-28 LAB — BASIC METABOLIC PANEL
Anion gap: 14 (ref 5–15)
BUN: 76 mg/dL — ABNORMAL HIGH (ref 8–23)
CO2: 18 mmol/L — ABNORMAL LOW (ref 22–32)
Calcium: 8 mg/dL — ABNORMAL LOW (ref 8.9–10.3)
Chloride: 102 mmol/L (ref 98–111)
Creatinine, Ser: 7.33 mg/dL — ABNORMAL HIGH (ref 0.44–1.00)
GFR, Estimated: 6 mL/min — ABNORMAL LOW (ref >60)
Glucose, Bld: 267 mg/dL — ABNORMAL HIGH (ref 70–99)
Potassium: 4.2 mmol/L (ref 3.5–5.1)
Sodium: 134 mmol/L — ABNORMAL LOW (ref 135–145)

## 2023-10-28 LAB — MAGNESIUM: Magnesium: 2.2 mg/dL (ref 1.7–2.4)

## 2023-10-28 LAB — HIV ANTIBODY (ROUTINE TESTING W REFLEX): HIV Screen 4th Generation wRfx: NONREACTIVE

## 2023-10-28 LAB — PHOSPHORUS: Phosphorus: 6.7 mg/dL — ABNORMAL HIGH (ref 2.5–4.6)

## 2023-10-28 MED ORDER — ONDANSETRON HCL 4 MG/2ML IJ SOLN
4.0000 mg | Freq: Four times a day (QID) | INTRAMUSCULAR | Status: DC | PRN
  Administered 2023-10-29 – 2023-11-01 (×9): 4 mg via INTRAVENOUS
  Filled 2023-10-28 (×9): qty 2

## 2023-10-28 MED ORDER — ONDANSETRON HCL 4 MG/2ML IJ SOLN
4.0000 mg | Freq: Four times a day (QID) | INTRAMUSCULAR | Status: DC
  Administered 2023-10-28: 4 mg via INTRAVENOUS
  Filled 2023-10-28: qty 2

## 2023-10-28 NOTE — ED Notes (Signed)
Patient's scheduled eye drops (Latanoprost) unable to be located but [facility] states they were given last night. This RN searched around [facility] and room 5 in the ER, where the patient was last roomed. Pharmacy notified and stated they would re-dispense.

## 2023-10-28 NOTE — Progress Notes (Signed)
Progress Note   Patient: [name] OZD:[id number] DOB: 1956/07/04 DOA: 10/27/2023     1 DOS: the patient was seen and examined on 10/28/2023   Brief hospital course: [name] is a 68 y.o. female with hx of CKD 5 following with [facility] for evaluation for renal transplant, atrial fibrillation on anticoagulation, CAD, NSTEMI, PAD, CVA, diabetes type 2, hypertension, hyperlipidemia, anemia, who was brought in by EMS for acute onset left lower quadrant abdominal pain.  Reports onset of symptoms yesterday.  Associated with diarrhea, nausea but no vomiting.  Decreased oral intake.  Her abdominal pain is worsened with time.  No prior episodes of diverticulitis in the past. She has a history of partial right colectomy for polyps reported noncancerous, remote history of partial hysterectomy.  Currently admitted for acute diverticulitis with contained perforation.  At this time surgery is well evaluated and advised to continue conservative management with IV antibiotics, pain medication and IV fluids  Assessment and Plan:  68 y.o. female with hx CKD 5 following with [facility] for evaluation for renal transplant, atrial fibrillation on anticoagulation, CAD, NSTEMI, PAD, CVA, diabetes type 2, hypertension, hyperlipidemia, anemia, R hemicolectomy, who was brought in by EMS for acute onset left lower quadrant abdominal pain.  Found to have complicated diverticulitis with contained perforation.   Complicated diverticulitis, contained perforation Onset symptoms 2/1 with severe progressive LLQ pain, assoc diarrhea. Exam with severe tenderness, guarding, and mild rebound. WBC 16. Lactate pending.  CT abdomen pelvis with wall thickening and stranding, free fluid adjacent to the descending colon, loosely organized fluid and gas in this area compatible with contained perforation, Adjacent jejunal inflammation. - EDP discussed case with general surgery Dr. Janee Morn who will consult on the patient in the morning - S/p  Zosyn in the ED, continue cefepime 1 g every 24 hours, Flagyl 500 mg IV every 12 hours. - S/p 500 cc IV fluid.  Given additional 1 L IV fluid, currently n.p.o. then started on mIVF 50 cc/h; close monitoring for volume overload in setting of her renal insufficiency. - Check lactate, most recent is 1.0 - Serial abdominal exam - Holding home Eliquis in case requires surgery (last received 2/2 in the AM)  -Continue n.p.o. status -General Surgeon evaluated the patient at this time advising conservative management.  If patient continues to improve we will need to resume Eliquis after discussing with general surgeon after 24 hours   Progression CKD 5 May have component of prerenal injury with decreased intake and underlying infection.  Creatinine 7 (last in 10/24 was 6), BUN 77.  No other acute electrolyte abnormalities - EDP discussed case with Dr. Signe Colt of nephrology who will consult in the morning. - IV fluids above - Hold her home Lasix - Strict I's and O's, daily weights   Chronic medical problems: A-fib on anticoagulation: Currently sinus.  Hold her home Eliquis per above.  On Coreg, currently held with borderline low pressures /sinus rhythm. CAD, NSTEMI; PAD; CVA; HLD: Management of anticoagulation per above.  To be on Repatha outpatient has not started. Diabetes type 2: Home regimen is glargine 10 units (last took on 1/31), insulin regular 5 to 10 units 3 times daily with meals.  Reduce basal insulin to 5 units daily in the morning, SSI 3 times daily for very sensitive. Hypertension: Borderline low pressures here.  Hold home amlodipine, carvedilol, hydralazine, isosorbide mononitrate Chronic anemia: Hemoglobin near baseline at 9; suspect 2/2 CKD    Body mass index is 33.94 kg/m. Obesity class  I     DVT prophylaxis:  SCDs      Subjective: Patient seen and examined bedside today.  Patient reports improvement in abdominal pain no fevers no active nausea vomiting.  Physical  Exam: Vitals:   10/28/23 1030 10/28/23 1200 10/28/23 1300 10/28/23 1330  BP: (!) 130/55 122/60 130/60 139/64  Pulse: 82 66 60 64  Resp: 20 13 (!) 22 11  Temp:      TempSrc:      SpO2: 97% 100% 100% 100%  Weight:      Height:       Gen: Awake, alert, mild distress from pain CV: Tachycardic, normal S1, S2, 2/6 SEM  resp: Tachypneic, CTAB  Abd: Flat, hypoactive, there is severe tenderness in the central/left hemiabdomen, with guarding, no rigidity, + mild rebound tenderness  MSK: Symmetric, no edema  Skin: No rashes or lesions to exposed skin  Neuro: Alert and interactive  Psych: in pain, appropriate  Data Reviewed:  Reviewed CBC CMP  Family Communication: Is alert and oriented  Disposition: Status is: Inpatient Remains inpatient appropriate because: Acute diverticulitis with contained perforation  Planned Discharge Destination: Home with Home Health    Time spent: 35 minutes  Author: Harold Hedge, MD 10/28/2023 1:57 PM  For on call review [email or web address].

## 2023-10-28 NOTE — Consult Note (Signed)
Reason for Consult:diverticulitis Referring Physician: Britni Henderly  [name] is an 68 y.o. female.  HPI: 68 yo F with PMHx CKD 5, A fib, CAD, and CVA C/O central and lower abdominal pain starting Saturday.  She had some diarrhea as well.  She has not had any vomiting but she has not been eating.  She is status post partial right colectomy in the past.  Last colonoscopy was a couple years ago.  Workup in the emergency department included CT scan of the abdomen and pelvis which revealed sigmoid diverticulitis with contained perforation.  She is being admitted by the medical service.  I was consulted for surgical management.  She reports the pain is significantly better since she got some pain medication.  Past Medical History:  Diagnosis Date   Anemia    Arthritis    Atrial fibrillation (HCC)    CAD (coronary artery disease)    Chronic kidney disease, stage III (moderate) (HCC)    Constipation    Diabetes mellitus (HCC)    History of blood transfusion    HLD (hyperlipidemia)    Hypertension    Myocardial infarction [facility])    in April 2014    Past Surgical History:  Procedure Laterality Date   ANKLE SURGERY Right    x 6 total   BREAST BIOPSY Left    15 or 20 yearsa ago she cant remember laterality    HARDWARE REMOVAL Right 02/13/2013   Procedure: HARDWARE REMOVAL;  Surgeon: Nadara Mustard, MD;  Location: [facility] OR;  Service: Orthopedics;  Laterality: Right;  Right Ankle Removal Hardware, Debridement, Place Wound VAC   laser surgery for diabetic retinopathy Bilateral    LEFT HEART CATHETERIZATION WITH CORONARY ANGIOGRAM N/A 01/09/2013   Procedure: LEFT HEART CATHETERIZATION WITH CORONARY ANGIOGRAM;  Surgeon: Dolores Patty, MD;  Location: [facility] CATH LAB;  Service: Cardiovascular;  Laterality: N/A;   ORIF ANKLE FRACTURE Right 01/12/2013   Procedure: OPEN REDUCTION INTERNAL FIXATION (ORIF) ANKLE FRACTURE;  Surgeon: Nadara Mustard, MD;  Location: [facility] OR;  Service: Orthopedics;   Laterality: Right;   PARTIAL HYSTERECTOMY      Family History  Problem Relation Age of Onset   Diabetes Mother        No history CAD   Colon cancer Mother 28       died at 22 from CRC   Hypertension Father        Also had CAD   Heart disease Father    Cervical cancer Sister    Diabetes Sister    Congestive Heart Failure Sister    Diabetes Brother    Kidney disease Brother    Heart disease Maternal Grandmother    Sickle cell anemia Son    Thyroid disease Son     Social History:  reports that she has never smoked. She has never used smokeless tobacco. She reports that she does not drink alcohol and does not use drugs.  Allergies:  Allergies  Allergen Reactions   Nsaids Other (See Comments)    CKD stage 4   Lisinopril Other (See Comments)    coughing   Peanut-Containing Drug Products Itching and Other (See Comments)    GI intolerance - diarrhea    Medications: I have reviewed the patient's current medications.  Results for orders placed or performed during the hospital encounter of 10/27/23 (from the past 48 hours)  Resp panel by RT-PCR (RSV, Flu A&B, Covid) Anterior Nasal Swab     Status: None  Collection Time: 10/27/23  7:18 PM   Specimen: Anterior Nasal Swab  Result Value Ref Range   SARS Coronavirus 2 by RT PCR NEGATIVE NEGATIVE   Influenza A by PCR NEGATIVE NEGATIVE   Influenza B by PCR NEGATIVE NEGATIVE    Comment: (NOTE) The Xpert Xpress SARS-CoV-2/FLU/RSV plus assay is intended as an aid in the diagnosis of influenza from Nasopharyngeal swab specimens and should not be used as a sole basis for treatment. Nasal washings and aspirates are unacceptable for Xpert Xpress SARS-CoV-2/FLU/RSV testing.  Fact Sheet for Patients: BloggerCourse.com  Fact Sheet for Healthcare Providers: SeriousBroker.it  This test is not yet approved or cleared by the [place] FDA and has been authorized for detection and/or  diagnosis of SARS-CoV-2 by FDA under an Emergency Use Authorization (EUA). This EUA will remain in effect (meaning this test can be used) for the duration of the COVID-19 declaration under Section 564(b)(1) of the Act, 21 U.S.C. section 360bbb-3(b)(1), unless the authorization is terminated or revoked.     Resp Syncytial Virus by PCR NEGATIVE NEGATIVE    Comment: (NOTE) Fact Sheet for Patients: BloggerCourse.com  Fact Sheet for Healthcare Providers: SeriousBroker.it  This test is not yet approved or cleared by the [place] FDA and has been authorized for detection and/or diagnosis of SARS-CoV-2 by FDA under an Emergency Use Authorization (EUA). This EUA will remain in effect (meaning this test can be used) for the duration of the COVID-19 declaration under Section 564(b)(1) of the Act, 21 U.S.C. section 360bbb-3(b)(1), unless the authorization is terminated or revoked.  Performed at [facility], [address], [place], [place] [address]   Comprehensive metabolic panel     Status: Abnormal   Collection Time: 10/27/23  7:36 PM  Result Value Ref Range   Sodium 134 (L) 135 - 145 mmol/L   Potassium 4.8 3.5 - 5.1 mmol/L    Comment: HEMOLYSIS AT THIS LEVEL MAY AFFECT RESULT   Chloride 100 98 - 111 mmol/L   CO2 18 (L) 22 - 32 mmol/L   Glucose, Bld 234 (H) 70 - 99 mg/dL    Comment: Glucose reference range applies only to samples taken after fasting for at least 8 hours.   BUN 77 (H) 8 - 23 mg/dL   Creatinine, Ser 6.04 (H) 0.44 - 1.00 mg/dL   Calcium 8.4 (L) 8.9 - 10.3 mg/dL   Total Protein 7.3 6.5 - 8.1 g/dL   Albumin 3.4 (L) 3.5 - 5.0 g/dL   AST 17 15 - 41 U/L    Comment: HEMOLYSIS AT THIS LEVEL MAY AFFECT RESULT   ALT 9 0 - 44 U/L    Comment: HEMOLYSIS AT THIS LEVEL MAY AFFECT RESULT   Alkaline Phosphatase 44 38 - 126 U/L   Total Bilirubin 1.0 0.0 - 1.2 mg/dL    Comment: HEMOLYSIS AT THIS LEVEL MAY AFFECT RESULT    GFR, Estimated 6 (L) >60 mL/min    Comment: (NOTE) Calculated using the CKD-EPI Creatinine Equation (2021)    Anion gap 16 (H) 5 - 15    Comment: Performed at [facility], [address] [place], [place] [address]  Lipase, blood     Status: None   Collection Time: 10/27/23  7:36 PM  Result Value Ref Range   Lipase 26 11 - 51 U/L    Comment: Performed at [facility], [address], [place], [place] [address]  CBC with Diff     Status: Abnormal  Collection Time: 10/27/23  7:36 PM  Result Value Ref Range   WBC 16.8 (H) 4.0 - 10.5 K/uL   RBC 3.80 (L) 3.87 - 5.11 MIL/uL   Hemoglobin 9.8 (L) 12.0 - 15.0 g/dL   HCT 29.5 (L) 62.1 - 30.8 %   MCV 82.9 80.0 - 100.0 fL   MCH 25.8 (L) 26.0 - 34.0 pg   MCHC 31.1 30.0 - 36.0 g/dL   RDW 65.7 84.6 - 96.2 %   Platelets 435 (H) 150 - 400 K/uL   nRBC 0.0 0.0 - 0.2 %   Neutrophils Relative % 91 %   Neutro Abs 15.2 (H) 1.7 - 7.7 K/uL   Lymphocytes Relative 3 %   Lymphs Abs 0.5 (L) 0.7 - 4.0 K/uL   Monocytes Relative 5 %   Monocytes Absolute 0.9 0.1 - 1.0 K/uL   Eosinophils Relative 0 %   Eosinophils Absolute 0.0 0.0 - 0.5 K/uL   Basophils Relative 0 %   Basophils Absolute 0.0 0.0 - 0.1 K/uL   Immature Granulocytes 1 %   Abs Immature Granulocytes 0.13 (H) 0.00 - 0.07 K/uL    Comment: Performed at [facility], [address], [place], [place] [address]  Lactic acid, plasma     Status: None   Collection Time: 10/27/23 10:40 PM  Result Value Ref Range   Lactic Acid, Venous 1.0 0.5 - 1.9 mmol/L    Comment: Performed at [facility], [address], [place], [place] [address]    CT ABDOMEN PELVIS WO CONTRAST Result Date: 10/27/2023 CLINICAL DATA:  Acute nonlocalized abdominal pain. Chills and fever. Vomiting and diarrhea. EXAM: CT ABDOMEN AND PELVIS WITHOUT CONTRAST TECHNIQUE: Multidetector CT imaging of the abdomen and pelvis was performed following the standard protocol without IV contrast. RADIATION DOSE REDUCTION:  This exam was performed according to the departmental dose-optimization program which includes automated exposure control, adjustment of the mA and/or kV according to patient size and/or use of iterative reconstruction technique. COMPARISON:  CT abdomen and pelvis 04/19/2021 FINDINGS: Lower chest: Bibasilar atelectasis.  No acute abnormality. Hepatobiliary: Cholelithiasis without evidence of acute cholecystitis. No biliary dilation. Unremarkable noncontrast appearance of the liver. Pancreas: Unremarkable. Spleen: Unremarkable. Adrenals/Urinary Tract: Normal adrenal glands. No urinary calculi or hydronephrosis. Unremarkable bladder. Stomach/Bowel: Stomach is within normal limits. No bowel obstruction. Colonic diverticulosis. Wall thickening, inflammatory stranding, and free fluid about the descending colon. Loosely organized fluid and gas about the inflamed descending colon (circa series 3/image 49-50) compatible with contained perforation. The adjacent jejunum is inflamed. Postoperative change about the colon with ileocolonic anastomosis in the right upper quadrant. Vascular/Lymphatic: Aortic atherosclerosis. No enlarged abdominal or pelvic lymph nodes. Reproductive: No acute abnormality. Other: Fat containing periumbilical hernia. Musculoskeletal: No acute fracture. IMPRESSION: 1. Acute diverticulitis of the descending colon with contained perforation. 2. Inflamed jejunum adjacent to the perforated diverticulitis. 3.  Aortic Atherosclerosis (ICD10-I70.0). Critical Value/emergent results were called by telephone at the time of interpretation on 10/27/2023 at 9:22 pm to provider [facility] , who verbally acknowledged these results. Electronically Signed   By: Minerva Fester M.D.   On: 10/27/2023 21:24    Review of Systems Blood pressure 112/78, pulse 76, temperature 99.3 F (37.4 C), temperature source Oral, resp. rate 18, height 5\' 7"  (1.702 m), weight 98.3 kg, SpO2 97%. Physical  Exam  Assessment/Plan: Sigmoid diverticulitis with contained perforation -agree with medical admission, IV Zosyn, and bowel rest.  She does not need emergency surgery but we will follow along closely  with you to make sure she continues to get better.  If she does not improve with medical management, she may need surgery this admission which would include a colostomy.  She is hoping to avoid that. CKD 5 CAD A-fib CVA  Liz Malady 10/28/2023, 1:17 AM

## 2023-10-29 DIAGNOSIS — N179 Acute kidney failure, unspecified: Secondary | ICD-10-CM

## 2023-10-29 DIAGNOSIS — I480 Paroxysmal atrial fibrillation: Secondary | ICD-10-CM

## 2023-10-29 DIAGNOSIS — D649 Anemia, unspecified: Secondary | ICD-10-CM

## 2023-10-29 DIAGNOSIS — K5732 Diverticulitis of large intestine without perforation or abscess without bleeding: Secondary | ICD-10-CM | POA: Diagnosis not present

## 2023-10-29 LAB — RENAL FUNCTION PANEL
Albumin: 2.8 g/dL — ABNORMAL LOW (ref 3.5–5.0)
Anion gap: 16 — ABNORMAL HIGH (ref 5–15)
BUN: 95 mg/dL — ABNORMAL HIGH (ref 8–23)
CO2: 19 mmol/L — ABNORMAL LOW (ref 22–32)
Calcium: 8 mg/dL — ABNORMAL LOW (ref 8.9–10.3)
Chloride: 103 mmol/L (ref 98–111)
Creatinine, Ser: 8.43 mg/dL — ABNORMAL HIGH (ref 0.44–1.00)
GFR, Estimated: 5 mL/min — ABNORMAL LOW (ref >60)
Glucose, Bld: 178 mg/dL — ABNORMAL HIGH (ref 70–99)
Phosphorus: 7.1 mg/dL — ABNORMAL HIGH (ref 2.5–4.6)
Potassium: 4.4 mmol/L (ref 3.5–5.1)
Sodium: 138 mmol/L (ref 135–145)

## 2023-10-29 LAB — CBC
HCT: 26.4 % — ABNORMAL LOW (ref 36.0–46.0)
Hemoglobin: 8.3 g/dL — ABNORMAL LOW (ref 12.0–15.0)
MCH: 25.9 pg — ABNORMAL LOW (ref 26.0–34.0)
MCHC: 31.4 g/dL (ref 30.0–36.0)
MCV: 82.2 fL (ref 80.0–100.0)
Platelets: 357 10*3/uL (ref 150–400)
RBC: 3.21 MIL/uL — ABNORMAL LOW (ref 3.87–5.11)
RDW: 14.9 % (ref 11.5–15.5)
WBC: 17.9 10*3/uL — ABNORMAL HIGH (ref 4.0–10.5)
nRBC: 0 % (ref 0.0–0.2)

## 2023-10-29 LAB — APTT: aPTT: 40 s — ABNORMAL HIGH (ref 24–36)

## 2023-10-29 LAB — GLUCOSE, CAPILLARY
Glucose-Capillary: 158 mg/dL — ABNORMAL HIGH (ref 70–99)
Glucose-Capillary: 195 mg/dL — ABNORMAL HIGH (ref 70–99)
Glucose-Capillary: 190 mg/dL — ABNORMAL HIGH (ref 70–99)
Glucose-Capillary: 214 mg/dL — ABNORMAL HIGH (ref 70–99)

## 2023-10-29 LAB — HEPARIN LEVEL (UNFRACTIONATED): Heparin Unfractionated: 0.69 [IU]/mL (ref 0.30–0.70)

## 2023-10-29 MED ORDER — HEPARIN (PORCINE) 25000 UT/250ML-% IV SOLN
1800.0000 [IU]/h | INTRAVENOUS | Status: DC
  Administered 2023-10-29: 1200 [IU]/h via INTRAVENOUS
  Administered 2023-10-30: 1700 [IU]/h via INTRAVENOUS
  Administered 2023-10-31 – 2023-11-01 (×3): 1950 [IU]/h via INTRAVENOUS
  Filled 2023-10-29 (×5): qty 250

## 2023-10-29 MED ORDER — PIPERACILLIN-TAZOBACTAM IN DEX 2-0.25 GM/50ML IV SOLN
2.2500 g | Freq: Three times a day (TID) | INTRAVENOUS | Status: DC
  Administered 2023-10-29 – 2023-11-01 (×9): 2.25 g via INTRAVENOUS
  Filled 2023-10-29 (×10): qty 50

## 2023-10-29 MED ORDER — CARVEDILOL 6.25 MG PO TABS
6.2500 mg | ORAL_TABLET | Freq: Two times a day (BID) | ORAL | Status: DC
  Administered 2023-10-29 – 2023-10-30 (×2): 6.25 mg via ORAL
  Filled 2023-10-29 (×2): qty 1

## 2023-10-29 NOTE — Plan of Care (Signed)

## 2023-10-29 NOTE — TOC CM/SW Note (Signed)
 Transition of Care [facility]) - Inpatient Brief Assessment   Patient Details  Name: [name] MRN: [id number] Date of Birth: [birth year]  Transition of [facility] CM/SW Contact:    Inocente GORMAN Kindle, LCSW Phone Number: 10/29/2023, 1:00 PM   Clinical Narrative: Patient admitted from home undergoing workup for Diverticulitis. Patient flagged for social isolation SDOH and community resources were provided. No current TOC needs at this time but please place consult if needs arise.    Transition of Care Asessment: Insurance and Status: Insurance coverage has been reviewed Patient has primary care physician: Yes Home environment has been reviewed: From home Prior level of function:: Independent Prior/Current Home Services: No current home services Social Drivers of Health Review: SDOH reviewed interventions complete Readmission risk has been reviewed: Yes Transition of care needs: no transition of care needs at this time

## 2023-10-29 NOTE — Progress Notes (Signed)
 Pharmacy Antibiotic Note  [name] is a 68 y.o. female admitted on 10/27/2023 with sigmoid diverticulitis with contained perforation. Day # 2 Cefepime  and Flagyl .  Pharmacy has been consulted for change to Zosyn  dosing.  AKI on CKD5.   Plan: Zosyn  2.25 gm IV q8h. Will follow renal function for any need to modify regimen. Follow up length of therapy.  Height: 5' 7 (170.2 cm) Weight: 98.3 kg (216 lb 11.4 oz) IBW/kg (Calculated) : 61.6  Temp (24hrs), Avg:98.1 F (36.7 C), Min:97.7 F (36.5 C), Max:98.6 F (37 C)  Recent Labs  Lab 10/27/23 1936 10/27/23 2240 10/28/23 0522 10/29/23 0725  WBC 16.8*  --  21.4* 17.9*  CREATININE 7.05*  --  7.33* 8.43*  LATICACIDVEN  --  1.0  --   --     Estimated Creatinine Clearance: 7.7 mL/min (A) (by C-G formula based on SCr of 8.43 mg/dL (H)).    Allergies  Allergen Reactions   Nsaids Other (See Comments)    CKD stage 4   Lisinopril Other (See Comments)    coughing   Peanut-Containing Drug Products Itching and Other (See Comments)    GI intolerance - diarrhea    Antimicrobials this admission: Zosyn  x 1 on 2/2; resuming 2/4>> Cefepime  2/3>>2/4 Flagyl  IV 2/3>>2/4  Dose adjustments this admission: n/a  Microbiology results: 2/2: COVID, flu and RSV: negative  Thank you for allowing pharmacy to be a part of this patient's care.  Genaro Zebedee Calin, [place] 10/29/2023 10:55 AM

## 2023-10-29 NOTE — Progress Notes (Addendum)
 PROGRESS NOTE        PATIENT DETAILS Name: [name] Age: 68 y.o. Sex: female Date of Birth: [birth year] Admit Date: 10/27/2023 Admitting Physician Dorn Dawson, MD ERE:Duntz, Damian, NP  Brief Summary: Patient is a 68 y.o.  female with history of CKD 5, A-fib on Eliquis , CAD-who presented with abdominal pain-found to have acute diverticulitis with contained perforation involving the descending colon.  Significant events: 2/2>> admit to [facility]  Significant studies: 2/2>> CT abdomen/pelvis: Acute diverticulitis-descending colon with contained perforation.  Significant microbiology data: 2/2>> COVID/influenza/RSV PCR: Negative  Procedures: None  Consults: Surgery  Subjective: Continues to have lower abdominal pain but slightly better than how she first presented.  Some nausea.  Objective: Vitals: Blood pressure 136/66, pulse 71, temperature 98.1 F (36.7 C), temperature source Oral, resp. rate 15, height 5' 7 (1.702 m), weight 98.3 kg, SpO2 99%.   Exam: Gen Exam:Alert awake-not in any distress HEENT:atraumatic, normocephalic Chest: B/L clear to auscultation anteriorly CVS:S1S2 regular Abdomen: Soft-tender in the lower abdomen-no rebound. Extremities:no edema Neurology: Non focal Skin: no rash  Pertinent Labs/Radiology:    Latest Ref Rng & Units 10/29/2023    7:25 AM 10/28/2023    5:22 AM 10/27/2023    7:36 PM  CBC  WBC 4.0 - 10.5 K/uL 17.9  21.4  16.8   Hemoglobin 12.0 - 15.0 g/dL 8.3  8.7  9.8   Hematocrit 36.0 - 46.0 % 26.4  27.9  31.5   Platelets 150 - 400 K/uL 357  355  435     Lab Results  Component Value Date   NA 138 10/29/2023   K 4.4 10/29/2023   CL 103 10/29/2023   CO2 19 (L) 10/29/2023      Assessment/Plan: Acute diverticulitis with contained perforation of the descending colon Continues to have some lower abdominal pain-leukocytosis decreasing Continue antibiotics-suspect can switch to Rocephin/Flagyl  General  Surgery following-recommendations are to continue conservative management and follow clinically  AKI on CKD 5 Recent baseline creatinine around 4-suspect has developed AKI in the setting of diverticulitis with contained perforation Volume status reasonable-electrolytes are relatively stable-some nausea but suspect this is from diverticulitis rather than uremia.  Reasonable to continue to monitor closely-no indications for dialysis-furthermore this morning she tells me that she is not interested in pursuing HD-even in a setting of life-threatening situation. Avoid nephrotoxic agents Follow renal function closely-per prior note-nephrology to see today.  CAD Currently no anginal symptoms Not on aspirin -as on anticoagulation Beta-blocker being resumed On Repatha injections as an outpatient  PAF Sinus rhythm Resume low-dose Coreg  Continue to hold Eliquis -as patient may require laparotomy-start IV heparin  in the interim  HTN BP reasonable-but slowly creeping up Will resume low-dose Coreg .  DM-2 (A1c 6.6 on 2/3) CBG stable Semglee  5 units daily+ SSI Follow/optimize  Recent Labs    10/28/23 1716 10/28/23 2255 10/29/23 0759  GLUCAP 167* 177* 158*     Class 1 Obesity: Estimated body mass index is 33.94 kg/m as calculated from the following:   Height as of this encounter: 5' 7 (1.702 m).   Weight as of this encounter: 98.3 kg.   Code status:   Code Status: Do not attempt resuscitation (DNR) PRE-ARREST INTERVENTIONS DESIRED   DVT Prophylaxis: SCDs Start: 10/27/23 2221 IV Heparin   Family Communication:Son-[name]- [id number] updated 2/4   Disposition Plan: Status is: Inpatient Remains inpatient appropriate because: Severity  of illness   Planned Discharge Destination:Home health   Diet: Diet Order             Diet NPO time specified  Diet effective now                     Antimicrobial agents: Anti-infectives (From admission, onward)    Start      Dose/Rate Route Frequency Ordered Stop   10/28/23 0600  piperacillin -tazobactam (ZOSYN ) IVPB 2.25 g  Status:  Discontinued        2.25 g 100 mL/hr over 30 Minutes Intravenous Every 8 hours 10/27/23 2224 10/27/23 2235   10/28/23 0600  ceFEPIme  (MAXIPIME ) 1 g in sodium chloride  0.9 % 100 mL IVPB        1 g 200 mL/hr over 30 Minutes Intravenous Every 24 hours 10/27/23 2235     10/28/23 0600  metroNIDAZOLE  (FLAGYL ) IVPB 500 mg        500 mg 100 mL/hr over 60 Minutes Intravenous Every 12 hours 10/27/23 2235     10/27/23 2130  piperacillin -tazobactam (ZOSYN ) IVPB 3.375 g        3.375 g 100 mL/hr over 30 Minutes Intravenous  Once 10/27/23 2128 10/27/23 2320        MEDICATIONS: Scheduled Meds:  insulin  aspart  0-6 Units Subcutaneous TID WC   insulin  glargine-yfgn  5 Units Subcutaneous Daily   latanoprost   1 drop Both Eyes QHS   sodium chloride  flush  3 mL Intravenous Q12H   Continuous Infusions:  ceFEPime  (MAXIPIME ) IV 1 g (10/29/23 9361)   heparin      metronidazole  500 mg (10/29/23 0656)   PRN Meds:.HYDROmorphone  (DILAUDID ) injection **OR** HYDROmorphone  (DILAUDID ) injection, ondansetron  (ZOFRAN ) IV   I have personally reviewed following labs and imaging studies  LABORATORY DATA: CBC: Recent Labs  Lab 10/27/23 1936 10/28/23 0522 10/29/23 0725  WBC 16.8* 21.4* 17.9*  NEUTROABS 15.2*  --   --   HGB 9.8* 8.7* 8.3*  HCT 31.5* 27.9* 26.4*  MCV 82.9 83.3 82.2  PLT 435* 355 357    Basic Metabolic Panel: Recent Labs  Lab 10/27/23 1936 10/28/23 0522 10/29/23 0725  NA 134* 134* 138  K 4.8 4.2 4.4  CL 100 102 103  CO2 18* 18* 19*  GLUCOSE 234* 267* 178*  BUN 77* 76* 95*  CREATININE 7.05* 7.33* 8.43*  CALCIUM  8.4* 8.0* 8.0*  MG  --  2.2  --   PHOS  --  6.7* 7.1*    GFR: Estimated Creatinine Clearance: 7.7 mL/min (A) (by C-G formula based on SCr of 8.43 mg/dL (H)).  Liver Function Tests: Recent Labs  Lab 10/27/23 1936 10/29/23 0725  AST 17  --   ALT 9  --    ALKPHOS 44  --   BILITOT 1.0  --   PROT 7.3  --   ALBUMIN  3.4* 2.8*   Recent Labs  Lab 10/27/23 1936  LIPASE 26   No results for input(s): AMMONIA in the last 168 hours.  Coagulation Profile: Recent Labs  Lab 10/28/23 0522  INR >10.0*    Cardiac Enzymes: No results for input(s): CKTOTAL, CKMB, CKMBINDEX, TROPONINI in the last 168 hours.  BNP (last 3 results) No results for input(s): PROBNP in the last 8760 hours.  Lipid Profile: No results for input(s): CHOL, HDL, LDLCALC, TRIG, CHOLHDL, LDLDIRECT in the last 72 hours.  Thyroid  Function Tests: No results for input(s): TSH, T4TOTAL, FREET4, T3FREE, THYROIDAB in the last 72 hours.  Anemia Panel:  No results for input(s): VITAMINB12, FOLATE, FERRITIN, TIBC, IRON , RETICCTPCT in the last 72 hours.  Urine analysis:    Component Value Date/Time   COLORURINE YELLOW 10/28/2023 0416   APPEARANCEUR HAZY (A) 10/28/2023 0416   LABSPEC 1.010 10/28/2023 0416   PHURINE 7.0 10/28/2023 0416   GLUCOSEU 150 (A) 10/28/2023 0416   GLUCOSEU NEGATIVE 05/05/2021 1228   HGBUR NEGATIVE 10/28/2023 0416   BILIRUBINUR NEGATIVE 10/28/2023 0416   KETONESUR NEGATIVE 10/28/2023 0416   PROTEINUR >=300 (A) 10/28/2023 0416   UROBILINOGEN 0.2 05/05/2021 1228   NITRITE NEGATIVE 10/28/2023 0416   LEUKOCYTESUR MODERATE (A) 10/28/2023 0416    Sepsis Labs: Lactic Acid, Venous    Component Value Date/Time   LATICACIDVEN 1.0 10/27/2023 2240    MICROBIOLOGY: Recent Results (from the past 240 hours)  Resp panel by RT-PCR (RSV, Flu A&B, Covid) Anterior Nasal Swab     Status: None   Collection Time: 10/27/23  7:18 PM   Specimen: Anterior Nasal Swab  Result Value Ref Range Status   SARS Coronavirus 2 by RT PCR NEGATIVE NEGATIVE Final   Influenza A by PCR NEGATIVE NEGATIVE Final   Influenza B by PCR NEGATIVE NEGATIVE Final    Comment: (NOTE) The Xpert Xpress SARS-CoV-2/FLU/RSV plus assay is intended as  an aid in the diagnosis of influenza from Nasopharyngeal swab specimens and should not be used as a sole basis for treatment. Nasal washings and aspirates are unacceptable for Xpert Xpress SARS-CoV-2/FLU/RSV testing.  Fact Sheet for Patients: bloggercourse.com  Fact Sheet for Healthcare Providers: seriousbroker.it  This test is not yet approved or cleared by the [place]  FDA and has been authorized for detection and/or diagnosis of SARS-CoV-2 by FDA under an Emergency Use Authorization (EUA). This EUA will remain in effect (meaning this test can be used) for the duration of the COVID-19 declaration under Section 564(b)(1) of the Act, 21 U.S.C. section 360bbb-3(b)(1), unless the authorization is terminated or revoked.     Resp Syncytial Virus by PCR NEGATIVE NEGATIVE Final    Comment: (NOTE) Fact Sheet for Patients: bloggercourse.com  Fact Sheet for Healthcare Providers: seriousbroker.it  This test is not yet approved or cleared by the [place]  FDA and has been authorized for detection and/or diagnosis of SARS-CoV-2 by FDA under an Emergency Use Authorization (EUA). This EUA will remain in effect (meaning this test can be used) for the duration of the COVID-19 declaration under Section 564(b)(1) of the Act, 21 U.S.C. section 360bbb-3(b)(1), unless the authorization is terminated or revoked.  Performed at [facility], [address] [place], [place] [address]     RADIOLOGY STUDIES/RESULTS: CT ABDOMEN PELVIS WO CONTRAST Result Date: 10/27/2023 CLINICAL DATA:  Acute nonlocalized abdominal pain. Chills and fever. Vomiting and diarrhea. EXAM: CT ABDOMEN AND PELVIS WITHOUT CONTRAST TECHNIQUE: Multidetector CT imaging of the abdomen and pelvis was performed following the standard protocol without IV contrast. RADIATION DOSE REDUCTION: This exam was performed according  to the departmental dose-optimization program which includes automated exposure control, adjustment of the mA and/or kV according to patient size and/or use of iterative reconstruction technique. COMPARISON:  CT abdomen and pelvis 04/19/2021 FINDINGS: Lower chest: Bibasilar atelectasis.  No acute abnormality. Hepatobiliary: Cholelithiasis without evidence of acute cholecystitis. No biliary dilation. Unremarkable noncontrast appearance of the liver. Pancreas: Unremarkable. Spleen: Unremarkable. Adrenals/Urinary Tract: Normal adrenal glands. No urinary calculi or hydronephrosis. Unremarkable bladder. Stomach/Bowel: Stomach is within normal limits. No bowel obstruction. Colonic diverticulosis. Wall thickening, inflammatory stranding, and free fluid about the descending  colon. Loosely organized fluid and gas about the inflamed descending colon (circa series 3/image 49-50) compatible with contained perforation. The adjacent jejunum is inflamed. Postoperative change about the colon with ileocolonic anastomosis in the right upper quadrant. Vascular/Lymphatic: Aortic atherosclerosis. No enlarged abdominal or pelvic lymph nodes. Reproductive: No acute abnormality. Other: Fat containing periumbilical hernia. Musculoskeletal: No acute fracture. IMPRESSION: 1. Acute diverticulitis of the descending colon with contained perforation. 2. Inflamed jejunum adjacent to the perforated diverticulitis. 3.  Aortic Atherosclerosis (ICD10-I70.0). Critical Value/emergent results were called by telephone at the time of interpretation on 10/27/2023 at 9:22 pm to provider [facility] , who verbally acknowledged these results. Electronically Signed   By: Norman Gatlin M.D.   On: 10/27/2023 21:24     LOS: 2 days   Donalda Applebaum, MD  [facility]    To contact the attending provider between 7A-7P or the covering provider during after hours 7P-7A, please log into the web site [email or web address] and access using universal [facility] password for that web site. If you do not have the password, please call the hospital operator.  10/29/2023, 8:54 AM

## 2023-10-29 NOTE — Progress Notes (Signed)
 PHARMACY - ANTICOAGULATION CONSULT NOTE  Pharmacy Consult for PTA eliquis  to heparin  Indication: atrial fibrillation  Allergies  Allergen Reactions   Nsaids Other (See Comments)    CKD stage 4   Lisinopril Other (See Comments)    coughing   Peanut-Containing Drug Products Itching and Other (See Comments)    GI intolerance - diarrhea    Patient Measurements: Height: 5' 7 (170.2 cm) Weight: 98.3 kg (216 lb 11.4 oz) IBW/kg (Calculated) : 61.6 Heparin  Dosing Weight: 83 kg  Vital Signs: Temp: 98 F (36.7 C) (02/04 0557) Temp Source: Oral (02/04 0557) BP: 151/73 (02/04 0557) Pulse Rate: 74 (02/04 0557)  Labs: Recent Labs    10/27/23 1936 10/28/23 0522  HGB 9.8* 8.7*  HCT 31.5* 27.9*  PLT 435* 355  LABPROT  --  90.1*  INR  --  >10.0*  CREATININE 7.05* 7.33*    Estimated Creatinine Clearance: 8.8 mL/min (A) (by C-G formula based on SCr of 7.33 mg/dL (H)).  Medications:  -PTA eliquis  2.5mg  PO BID (last dose 10/27/2023 AM)  Assessment: 50 yoF presents with abdominal pain with complicated diverticulitis w/ contained perforation and possible need for colectomy. PMH: CKD 5, A fib, CAD, and CVA   -Hgb 8.7, plts 355  Goal of Therapy:  Heparin  level 0.3-0.7 units/ml aPTT 66-102 seconds Monitor platelets by anticoagulation protocol: Yes   Plan:  Start heparin  infusion at 1200 units/hr Check anti-Xa and aPTT level in 8 hours and daily while on heparin  Continue to monitor H&H and platelets  Lynwood Poplar, PharmD. Clinical Pharmacist 10/29/2023 7:06 AM

## 2023-10-29 NOTE — Progress Notes (Signed)
 PHARMACY - ANTICOAGULATION CONSULT NOTE  Pharmacy Consult for PTA eliquis  to heparin  Indication: atrial fibrillation  Allergies  Allergen Reactions   Nsaids Other (See Comments)    CKD stage 4   Lisinopril Other (See Comments)    coughing   Peanut-Containing Drug Products Itching and Other (See Comments)    GI intolerance - diarrhea    Patient Measurements: Height: 5' 7 (170.2 cm) Weight: 98.3 kg (216 lb 11.4 oz) IBW/kg (Calculated) : 61.6 Heparin  Dosing Weight: 83 kg  Vital Signs: Temp: 97.5 F (36.4 C) (02/04 1548) Temp Source: Oral (02/04 1548) BP: 141/58 (02/04 1548) Pulse Rate: 76 (02/04 1548)  Labs: Recent Labs    10/27/23 1936 10/28/23 0522 10/29/23 0725 10/29/23 1806  HGB 9.8* 8.7* 8.3*  --   HCT 31.5* 27.9* 26.4*  --   PLT 435* 355 357  --   APTT  --   --   --  40*  LABPROT  --  90.1*  --   --   INR  --  >10.0*  --   --   HEPARINUNFRC  --   --   --  0.69  CREATININE 7.05* 7.33* 8.43*  --     Estimated Creatinine Clearance: 7.7 mL/min (A) (by C-G formula based on SCr of 8.43 mg/dL (H)).  Medications:  -PTA eliquis  2.5mg  PO BID (last dose 10/27/2023 AM)  Assessment: 41 yoF presents with abdominal pain with complicated diverticulitis w/ contained perforation and possible need for colectomy. PMH: CKD 5, A fib, CAD, and CVA   -Hgb 8.7, plts 355  PM: aPTT 40 (subtherapeutic), HL 0.69 - still some effects of DOAC on board. No issues with the heparin  infusion or bleeding reported per RN.  Goal of Therapy:  Heparin  level 0.3-0.7 units/ml aPTT 66-102 seconds Monitor platelets by anticoagulation protocol: Yes   Plan:  Increase heparin  infusion to 1400 units/hr Check anti-Xa and aPTT level in 8 hours and daily while on heparin  Continue to monitor H&H and platelets  Rocky Slade, PharmD, BCPS Clinical Pharmacist 10/29/2023 6:59 PM

## 2023-10-29 NOTE — Progress Notes (Signed)
 Progress Note     Subjective: Pt reports she is still having lower abdominal pain but improved from initial presentation. Had some nausea and dry heaves overnight. Not passing much gas or flatus.   Objective: Vital signs in last 24 hours: Temp:  [97.7 F (36.5 C)-98.6 F (37 C)] 98.1 F (36.7 C) (02/04 0759) Pulse Rate:  [60-85] 71 (02/04 0759) Resp:  [11-23] 15 (02/04 0759) BP: (121-153)/(55-114) 136/66 (02/04 0759) SpO2:  [95 %-100 %] 99 % (02/04 0759)    Intake/Output from previous day: 02/03 0701 - 02/04 0700 In: 100 [IV Piggyback:100] Out: -  Intake/Output this shift: No intake/output data recorded.  PE: General: pleasant, WD, WN female who is laying in bed in NAD Heart: regular, rate, and rhythm.   Lungs: Respiratory effort nonlabored Abd: soft, moderate TTP in LLQ and suprapubic abdomen without peritonitis, mild distention Psych: A&Ox3 with an appropriate affect.    Lab Results:  Recent Labs    10/28/23 0522 10/29/23 0725  WBC 21.4* 17.9*  HGB 8.7* 8.3*  HCT 27.9* 26.4*  PLT 355 357   BMET Recent Labs    10/28/23 0522 10/29/23 0725  NA 134* 138  K 4.2 4.4  CL 102 103  CO2 18* 19*  GLUCOSE 267* 178*  BUN 76* 95*  CREATININE 7.33* 8.43*  CALCIUM  8.0* 8.0*   PT/INR Recent Labs    10/28/23 0522  LABPROT 90.1*  INR >10.0*   CMP     Component Value Date/Time   NA 138 10/29/2023 0725   NA 137 02/28/2021 1548   K 4.4 10/29/2023 0725   CL 103 10/29/2023 0725   CO2 19 (L) 10/29/2023 0725   GLUCOSE 178 (H) 10/29/2023 0725   BUN 95 (H) 10/29/2023 0725   BUN 33 (H) 02/28/2021 1548   CREATININE 8.43 (H) 10/29/2023 0725   CREATININE 3.38 (H) 12/26/2015 1347   CALCIUM  8.0 (L) 10/29/2023 0725   PROT 7.3 10/27/2023 1936   PROT 7.0 11/07/2017 1414   ALBUMIN  2.8 (L) 10/29/2023 0725   ALBUMIN  4.0 11/07/2017 1414   AST 17 10/27/2023 1936   ALT 9 10/27/2023 1936   ALKPHOS 44 10/27/2023 1936   BILITOT 1.0 10/27/2023 1936   BILITOT 0.2  11/07/2017 1414   GFRNONAA 5 (L) 10/29/2023 0725   GFRNONAA 13 (L) 01/10/2015 1428   GFRAA 21 (L) 12/21/2019 1802   GFRAA 16 (L) 01/10/2015 1428   Lipase     Component Value Date/Time   LIPASE 26 10/27/2023 1936       Studies/Results: CT ABDOMEN PELVIS WO CONTRAST Result Date: 10/27/2023 CLINICAL DATA:  Acute nonlocalized abdominal pain. Chills and fever. Vomiting and diarrhea. EXAM: CT ABDOMEN AND PELVIS WITHOUT CONTRAST TECHNIQUE: Multidetector CT imaging of the abdomen and pelvis was performed following the standard protocol without IV contrast. RADIATION DOSE REDUCTION: This exam was performed according to the departmental dose-optimization program which includes automated exposure control, adjustment of the mA and/or kV according to patient size and/or use of iterative reconstruction technique. COMPARISON:  CT abdomen and pelvis 04/19/2021 FINDINGS: Lower chest: Bibasilar atelectasis.  No acute abnormality. Hepatobiliary: Cholelithiasis without evidence of acute cholecystitis. No biliary dilation. Unremarkable noncontrast appearance of the liver. Pancreas: Unremarkable. Spleen: Unremarkable. Adrenals/Urinary Tract: Normal adrenal glands. No urinary calculi or hydronephrosis. Unremarkable bladder. Stomach/Bowel: Stomach is within normal limits. No bowel obstruction. Colonic diverticulosis. Wall thickening, inflammatory stranding, and free fluid about the descending colon. Loosely organized fluid and gas about the inflamed descending colon (circa series 3/image  49-50) compatible with contained perforation. The adjacent jejunum is inflamed. Postoperative change about the colon with ileocolonic anastomosis in the right upper quadrant. Vascular/Lymphatic: Aortic atherosclerosis. No enlarged abdominal or pelvic lymph nodes. Reproductive: No acute abnormality. Other: Fat containing periumbilical hernia. Musculoskeletal: No acute fracture. IMPRESSION: 1. Acute diverticulitis of the descending colon with  contained perforation. 2. Inflamed jejunum adjacent to the perforated diverticulitis. 3.  Aortic Atherosclerosis (ICD10-I70.0). Critical Value/emergent results were called by telephone at the time of interpretation on 10/27/2023 at 9:22 pm to provider [facility] , who verbally acknowledged these results. Electronically Signed   By: Norman Gatlin M.D.   On: 10/27/2023 21:24    Anti-infectives: Anti-infectives (From admission, onward)    Start     Dose/Rate Route Frequency Ordered Stop   10/28/23 0600  piperacillin -tazobactam (ZOSYN ) IVPB 2.25 g  Status:  Discontinued        2.25 g 100 mL/hr over 30 Minutes Intravenous Every 8 hours 10/27/23 2224 10/27/23 2235   10/28/23 0600  ceFEPIme  (MAXIPIME ) 1 g in sodium chloride  0.9 % 100 mL IVPB        1 g 200 mL/hr over 30 Minutes Intravenous Every 24 hours 10/27/23 2235     10/28/23 0600  metroNIDAZOLE  (FLAGYL ) IVPB 500 mg        500 mg 100 mL/hr over 60 Minutes Intravenous Every 12 hours 10/27/23 2235     10/27/23 2130  piperacillin -tazobactam (ZOSYN ) IVPB 3.375 g        3.375 g 100 mL/hr over 30 Minutes Intravenous  Once 10/27/23 2128 10/27/23 2320        Assessment/Plan  Sigmoid diverticulitis with contained perforation  - CT with above, no abscess - hx of right hemicolectomy for polyps that were too large to be removed during colonoscopy  - WBC 17K from 21K yesterday - ok to have CLD today but would not advance past this today - continue IV abx and recheck labs and abdominal exam tomorrow  - no indication for emergent surgical intervention at this time but we will continue to follow  FEN: CLD VTE: heparin  gtt ID: cefepime /flagyl  2/3>>  - per [facility] -  PAF  HTN HLD CKD stage III CAD T2DM  LOS: 2 days   I reviewed hospitalist notes, last 24 h vitals and pain scores, last 48 h intake and output, last 24 h labs and trends, and last 24 h imaging results.  This care required moderate level of medical decision making.     Burnard JONELLE Louder, [facility] 10/29/2023, 9:34 AM Please see Amion for pager number during day hours 7:00am-4:30pm

## 2023-10-29 NOTE — Discharge Instructions (Addendum)
 To address social isolation:  Education Officer, [employer]: [phone], [address], [place], [place] [address]. Contact the [employer] for resources [phone].  CCS      [facility], [place] [phone]  OPEN ABDOMINAL SURGERY: POST OP INSTRUCTIONS  Always review your discharge instruction sheet given to you by the facility where your surgery was performed.  IF YOU HAVE DISABILITY OR FAMILY LEAVE FORMS, YOU MUST BRING THEM TO THE OFFICE FOR PROCESSING.  PLEASE DO NOT GIVE THEM TO YOUR DOCTOR.  A prescription for pain medication may be given to you upon discharge.  Take your pain medication as prescribed, if needed.  If narcotic pain medicine is not needed, then you may take acetaminophen  (Tylenol ) as needed. Do not take Advil, ibuprofen or other NSAIDs with you history of kidney disease Take your usually prescribed medications unless otherwise directed. If you need a refill on your pain medication, please contact your pharmacy. They will contact our office to request authorization.  Prescriptions will not be filled after 5pm or on week-ends. You should follow a light diet the first few days after arrival home, such as soup and crackers, pudding, etc.unless your doctor has advised otherwise. A high-fiber, low fat diet can be resumed as tolerated.   Be sure to include lots of fluids daily. Most patients will experience some swelling and bruising on the chest and neck area.  Ice packs will help.  Swelling and bruising can take several days to resolve Most patients will experience some swelling and bruising in the area of the incision. Ice pack will help. Swelling and bruising can take several days to resolve..  It is common to experience some constipation if taking pain medication after surgery.  Increasing fluid intake and taking a stool softener will usually help or prevent this problem from occurring.  A mild laxative (Milk of Magnesia or Miralax ) should be taken according to  package directions if there are no bowel movements after 48 hours.  You may have steri-strips (small skin tapes) in place directly over the incision.  These strips should be left on the skin for 7-10 days.  If your surgeon used skin glue on the incision, you may shower in 24 hours.  The glue will flake off over the next 2-3 weeks.  Any sutures or staples will be removed at the office during your follow-up visit. You may find that a light gauze bandage over your incision may keep your staples from being rubbed or pulled. You may shower and replace the bandage daily. ACTIVITIES:  You may resume regular (light) daily activities beginning the next day--such as daily self-care, walking, climbing stairs--gradually increasing activities as tolerated.  You may have sexual intercourse when it is comfortable.  Refrain from any heavy lifting or straining until approved by your doctor. You may drive when you no longer are taking prescription pain medication, you can comfortably wear a seatbelt, and you can safely maneuver your car and apply brakes Return to Work: ___________________________________ [name] should see your doctor in the office for a follow-up appointment approximately two weeks after your surgery.  Make sure that you call for this appointment within a day or two after you arrive home to insure a convenient appointment time. OTHER INSTRUCTIONS:  _____________________________________________________________ _____________________________________________________________  WHEN TO CALL YOUR DOCTOR: Fever over 101.0 Inability to urinate Nausea and/or vomiting Extreme swelling or bruising Continued bleeding from incision. Increased pain, redness, or drainage from the incision. Difficulty swallowing or breathing Muscle cramping or spasms.  Numbness or tingling in hands or feet or around lips.  The clinic staff is available to answer your questions during regular business hours.  Please don't hesitate to  call and ask to speak to one of the nurses if you have concerns.  For further questions, please visit [email or web address]

## 2023-10-30 DIAGNOSIS — N179 Acute kidney failure, unspecified: Secondary | ICD-10-CM | POA: Diagnosis not present

## 2023-10-30 DIAGNOSIS — I480 Paroxysmal atrial fibrillation: Secondary | ICD-10-CM | POA: Diagnosis not present

## 2023-10-30 DIAGNOSIS — Z7189 Other specified counseling: Secondary | ICD-10-CM | POA: Diagnosis not present

## 2023-10-30 DIAGNOSIS — K5780 Diverticulitis of intestine, part unspecified, with perforation and abscess without bleeding: Secondary | ICD-10-CM | POA: Diagnosis not present

## 2023-10-30 DIAGNOSIS — D649 Anemia, unspecified: Secondary | ICD-10-CM | POA: Diagnosis not present

## 2023-10-30 DIAGNOSIS — K5732 Diverticulitis of large intestine without perforation or abscess without bleeding: Secondary | ICD-10-CM | POA: Diagnosis not present

## 2023-10-30 DIAGNOSIS — Z515 Encounter for palliative care: Secondary | ICD-10-CM | POA: Diagnosis not present

## 2023-10-30 LAB — APTT
aPTT: 39 s — ABNORMAL HIGH (ref 24–36)
aPTT: >200 s (ref 24–36)
aPTT: 54 s — ABNORMAL HIGH (ref 24–36)

## 2023-10-30 LAB — CBC
HCT: 24.6 % — ABNORMAL LOW (ref 36.0–46.0)
Hemoglobin: 7.9 g/dL — ABNORMAL LOW (ref 12.0–15.0)
MCH: 26.1 pg (ref 26.0–34.0)
MCHC: 32.1 g/dL (ref 30.0–36.0)
MCV: 81.2 fL (ref 80.0–100.0)
Platelets: 356 10*3/uL (ref 150–400)
RBC: 3.03 MIL/uL — ABNORMAL LOW (ref 3.87–5.11)
RDW: 14.6 % (ref 11.5–15.5)
WBC: 17.3 10*3/uL — ABNORMAL HIGH (ref 4.0–10.5)
nRBC: 0 % (ref 0.0–0.2)

## 2023-10-30 LAB — RENAL FUNCTION PANEL
Albumin: 2.6 g/dL — ABNORMAL LOW (ref 3.5–5.0)
Anion gap: 15 (ref 5–15)
BUN: 101 mg/dL — ABNORMAL HIGH (ref 8–23)
CO2: 18 mmol/L — ABNORMAL LOW (ref 22–32)
Calcium: 7.9 mg/dL — ABNORMAL LOW (ref 8.9–10.3)
Chloride: 102 mmol/L (ref 98–111)
Creatinine, Ser: 9.26 mg/dL — ABNORMAL HIGH (ref 0.44–1.00)
GFR, Estimated: 4 mL/min — ABNORMAL LOW (ref >60)
Glucose, Bld: 209 mg/dL — ABNORMAL HIGH (ref 70–99)
Phosphorus: 6.8 mg/dL — ABNORMAL HIGH (ref 2.5–4.6)
Potassium: 4.4 mmol/L (ref 3.5–5.1)
Sodium: 135 mmol/L (ref 135–145)

## 2023-10-30 LAB — IRON AND TIBC
Iron: 12 ug/dL — ABNORMAL LOW (ref 28–170)
Saturation Ratios: 7 % — ABNORMAL LOW (ref 10.4–31.8)
TIBC: 181 ug/dL — ABNORMAL LOW (ref 250–450)
UIBC: 169 ug/dL

## 2023-10-30 LAB — GLUCOSE, CAPILLARY
Glucose-Capillary: 206 mg/dL — ABNORMAL HIGH (ref 70–99)
Glucose-Capillary: 183 mg/dL — ABNORMAL HIGH (ref 70–99)
Glucose-Capillary: 215 mg/dL — ABNORMAL HIGH (ref 70–99)
Glucose-Capillary: 180 mg/dL — ABNORMAL HIGH (ref 70–99)

## 2023-10-30 LAB — HEPARIN LEVEL (UNFRACTIONATED): Heparin Unfractionated: 0.48 [IU]/mL (ref 0.30–0.70)

## 2023-10-30 LAB — FERRITIN: Ferritin: 140 ng/mL (ref 11–307)

## 2023-10-30 MED ORDER — INSULIN GLARGINE-YFGN 100 UNIT/ML ~~LOC~~ SOLN
8.0000 [IU] | Freq: Every day | SUBCUTANEOUS | Status: DC
  Administered 2023-10-30 – 2023-10-31 (×2): 8 [IU] via SUBCUTANEOUS
  Filled 2023-10-30 (×3): qty 0.08

## 2023-10-30 MED ORDER — LACTATED RINGERS IV SOLN: INTRAVENOUS | Status: AC

## 2023-10-30 MED ORDER — SODIUM BICARBONATE 650 MG PO TABS
650.0000 mg | ORAL_TABLET | Freq: Two times a day (BID) | ORAL | Status: DC
  Administered 2023-10-30 – 2023-10-31 (×4): 650 mg via ORAL
  Filled 2023-10-30 (×4): qty 1

## 2023-10-30 MED ORDER — AMLODIPINE BESYLATE 5 MG PO TABS
5.0000 mg | ORAL_TABLET | Freq: Every day | ORAL | Status: DC
  Administered 2023-10-30 – 2023-10-31 (×2): 5 mg via ORAL
  Filled 2023-10-30 (×2): qty 1

## 2023-10-30 MED ORDER — CARVEDILOL 12.5 MG PO TABS
12.5000 mg | ORAL_TABLET | Freq: Two times a day (BID) | ORAL | Status: DC
  Administered 2023-10-30 – 2023-11-01 (×4): 12.5 mg via ORAL
  Filled 2023-10-30 (×4): qty 1

## 2023-10-30 NOTE — Evaluation (Signed)
 Physical Therapy Evaluation Patient Details Name: [name] MRN: [id number] DOB: 1956-03-15 Today's Date: 10/30/2023  History of Present Illness  Patient is a 68 y.o.  female - who presented with abdominal pain-found to have acute diverticulitis with contained perforation involving the descending colon.  Past medical history of CKD 5, A-fib on Eliquis , CAD  Clinical Impression  Pt presents with admitting diagnosis above. Pt today was able to ambulate in hallway with [facility] Min A. Pt was rather unsteady with SPC and would likely benefit from using a RW going forward. PTA pt reports she was independent using no AD around the house and [facility] outside of the house. Recommend HHPT upon DC. PT will continue to follow.         If plan is discharge home, recommend the following: A little help with walking and/or transfers;A little help with bathing/dressing/bathroom;Assistance with cooking/housework;Assist for transportation;Help with stairs or ramp for entrance   Can travel by private vehicle        Equipment Recommendations None recommended by PT  Recommendations for Other Services       Functional Status Assessment Patient has had a recent decline in their functional status and demonstrates the ability to make significant improvements in function in a reasonable and predictable amount of time.     Precautions / Restrictions Precautions Precautions: Fall Restrictions Weight Bearing Restrictions Per Provider Order: No      Mobility  Bed Mobility Overal bed mobility: Modified Independent                  Transfers Overall transfer level: Needs assistance Equipment used: Straight cane Transfers: Sit to/from Stand Sit to Stand: Supervision           General transfer comment: no physical assist needed    Ambulation/Gait Ambulation/Gait assistance: Min assist, +2 safety/equipment Gait Distance (Feet): 100 Feet Assistive device: Straight cane Gait Pattern/deviations:  Wide base of support, Drifts right/left, Decreased stride length, Step-through pattern Gait velocity: decreased     General Gait Details: Pt was rather unsteady today noted to have very wide BOS with SPC very far outside of BOS. Pt reported this was her baseline however pt would likely benefit greatly from a RW going forward. Few LOB noted requiring Min A correct.  Stairs            Wheelchair Mobility     Tilt Bed    Modified Rankin (Stroke Patients Only)       Balance Overall balance assessment: Needs assistance Sitting-balance support: Feet supported, No upper extremity supported Sitting balance-Leahy Scale: Good     Standing balance support: During functional activity, Single extremity supported Standing balance-Leahy Scale: Poor Standing balance comment: reliant on external support                             Pertinent Vitals/Pain Pain Assessment Pain Assessment: 0-10 Pain Score: 3  Pain Location: Abdominal pain Pain Descriptors / Indicators: Sore, Discomfort Pain Intervention(s): Monitored during session, Premedicated before session    Home Living Family/patient expects to be discharged to:: Private residence Living Arrangements: Children Available Help at Discharge: Family;Available 24 hours/day Type of Home: House Home Access: Stairs to enter Entrance Stairs-Rails: Can reach both Entrance Stairs-Number of Steps: 8 Alternate Level Stairs-Number of Steps: 4 Home Layout: Other (Comment);Bed/bath upstairs;Multi-level (Split level) Home Equipment: Cane - single point;Wheelchair - Forensic Psychologist (2 wheels);Other (comment);Shower seat (Knee walker)      Prior  Function Prior Level of Function : Independent/Modified Independent             Mobility Comments: Ind no AD around house but [facility] outside the house ADLs Comments: Ind     Extremity/Trunk Assessment   Upper Extremity Assessment Upper Extremity Assessment: Overall WFL for  tasks assessed    Lower Extremity Assessment Lower Extremity Assessment: Overall WFL for tasks assessed    Cervical / Trunk Assessment Cervical / Trunk Assessment: Normal  Communication   Communication Communication: No apparent difficulties  Cognition Arousal: Alert Behavior During Therapy: WFL for tasks assessed/performed Overall Cognitive Status: Within Functional Limits for tasks assessed                                          General Comments General comments (skin integrity, edema, etc.): VSS on 2L    Exercises     Assessment/Plan    PT Assessment Patient needs continued PT services  PT Problem List Decreased strength;Decreased range of motion;Decreased activity tolerance;Decreased balance;Decreased mobility;Decreased knowledge of use of DME;Decreased safety awareness;Decreased knowledge of precautions;Decreased coordination;Cardiopulmonary status limiting activity       PT Treatment Interventions DME instruction;Gait training;Stair training;Functional mobility training;Therapeutic activities;Therapeutic exercise;Balance training;Neuromuscular re-education;Patient/family education    PT Goals (Current goals can be found in the Care Plan section)  Acute Rehab PT Goals Patient Stated Goal: to go home PT Goal Formulation: With patient Time For Goal Achievement: 11/13/23 Potential to Achieve Goals: Good    Frequency Min 1X/week     Co-evaluation               AM-PAC PT 6 Clicks Mobility  Outcome Measure Help needed turning from your back to your side while in a flat bed without using bedrails?: None Help needed moving from lying on your back to sitting on the side of a flat bed without using bedrails?: None Help needed moving to and from a bed to a chair (including a wheelchair)?: A Little Help needed standing up from a chair using your arms (e.g., wheelchair or bedside chair)?: A Little Help needed to walk in hospital room?: A  Little Help needed climbing 3-5 steps with a railing? : A Lot 6 Click Score: 19    End of Session Equipment Utilized During Treatment: Gait belt;Oxygen Activity Tolerance: Patient tolerated treatment well Patient left: in bed;with call bell/phone within reach;with family/visitor present Nurse Communication: Mobility status PT Visit Diagnosis: Other abnormalities of gait and mobility (R26.89)    Time: 8944-8880 PT Time Calculation (min) (ACUTE ONLY): 24 min   Charges:   PT Evaluation $PT Eval Moderate Complexity: 1 Mod PT Treatments $Gait Training: 8-22 mins PT General Charges $$ ACUTE PT VISIT: 1 Visit         [name], PT, DPT Acute Rehab Services [phone]   Buford Bremer 10/30/2023, 1:59 PM

## 2023-10-30 NOTE — Plan of Care (Signed)
  Problem: Education: Goal: Knowledge of General Education information will improve Description: Including pain rating scale, medication(s)/side effects and non-pharmacologic comfort measures Outcome: Progressing   Problem: Health Behavior/Discharge Planning: Goal: Ability to manage health-related needs will improve Outcome: Progressing   Problem: Clinical Measurements: Goal: Ability to maintain clinical measurements within normal limits will improve Outcome: Progressing Goal: Diagnostic test results will improve Outcome: Progressing   Problem: Activity: Goal: Risk for activity intolerance will decrease Outcome: Progressing   

## 2023-10-30 NOTE — Consult Note (Signed)
 Palliative Care Consult Note                                  Date: 10/30/2023   Patient Name: [name]  DOB: 1955-12-03  MRN: [id number]  Age / Sex: 68 y.o., female  PCP: Campbell Reynolds, NP Referring Physician: Raenelle Donalda HERO, MD  Reason for Consultation: Establishing goals of care  HPI/Patient Profile: 68 y.o. female  with past medical history of CKD stage V, A-fib on Eliquis , DM type 2, CAD, and HTN who presented to the ED on 10/27/2023 with abdominal pain and was found to have acute diverticulitis with contained perforation involving the descending colon.  She also has AKI on CKD stage V, and BUN/creatinine is trending up.  Palliative Medicine has been consulted for goals of care and medical decision making.   Subjective:   Extensive chart review has been completed including labs, vital signs, imaging, progress/consult notes, orders, medications and available advance directive documents.   I met with patient at bedside to discuss diagnosis, prognosis, GOC, EOL wishes, disposition, and options.  I introduced Palliative Medicine as specialized medical care for people living with serious illness.   Created space and opportunity for patient to express thoughts and feelings regarding current medical situation. Values and goals of care were attempted to be elicited.  Social History: Patient has 3 living sons. Her other son died 6 years ago from complications from sickle cell disease.  Patient's 3 sons live with her at home.  Shares that her functional status and overall health has been going downhill for the past 6 months.  GOC Discussion: We discussed patient's current illness and what it means in the larger context of her ongoing co-morbidities. Current clinical status was reviewed.   Patient is very clear that she is more concerned about quality of life than quantity of life.  She shares that her brother is on dialysis  and does not have good quality of life.  He has stated that he regrets starting dialysis.  Discussed the concept of human mortality and the limitations of medical interventions to prolong quality of life when the body starts to fail.   Patient shares that she has a strong Christian faith, which is a source of peace/comfort when considering her own mortality.  She states that she is not afraid to die.  Patient states she would consider peritoneal dialysis, but understands this is currently not an option due to intra-abdominal infection.  I asked her if she would consider HD as a bridge to possible eligibility for peritoneal dialysis in the future - she is not sure but will continue to think about this.  Discussed with patient that if her ultimate decision is not to start dialysis, then the medical team would certainly support and honor this.  Discussed recommendation would be for hospice to ensure comfort at end-of-life.  discussed the difference between home hospice versus inpatient hospice facility.  Patient thinks she would prefer a hospice facility, as she does not want to die at home with her sons living there.  Patient shares that her sons are struggling with the situation, as they understandably do not want to lose their mother.  She does feel that they would ultimately respect and honor her decision not to start dialysis.   Discussed/clarified code status. Patient is currently DNR with pre-arrest interventions. discussed that mechanical ventilation would not likely offer benefit in the setting  of progressive renal failure.  Patient agrees that DNR/DNI is appropriate.  Emotional support provided.  Questions and concerns addressed to the best of my ability.  Review of Systems  Constitutional:  Positive for fatigue.  Gastrointestinal:  Positive for abdominal pain.    Objective:   Primary Diagnoses: Present on Admission: . Diverticulitis of large intestine with complication   Physical  Exam Vitals reviewed.  Constitutional:      General: She is not in acute distress.    Appearance: She is ill-appearing.  Pulmonary:     Effort: Pulmonary effort is normal.  Neurological:     Mental Status: She is alert and oriented to person, place, and time.  Psychiatric:        Mood and Affect: Affect is tearful.     Palliative Assessment/Data: PPS 40-50%     Assessment & Plan:   SUMMARY OF RECOMMENDATIONS   Code status changed to DNR/DNI (from DNR-interventions) Continue current supportive interventions Ongoing GOC discussions regarding HD I will follow-up tomorrow, possibly to include meeting with sons  Primary Decision Maker: PATIENT  Symptom Management:  Continue Dilaudid  0.5 to 1 mg IV every 4 hours as needed for pain  Prognosis:  Unable to determine  Discharge Planning:  To Be Determined    Thank you for allowing us  to participate in the care of KORI GOINS   Time Total: 80 minutes  Detailed review of medical records (labs, imaging, vital signs), medically appropriate exam, discussed with treatment team, counseling and education to patient, family, & staff, documenting clinical information, coordination of care.   Signed by: Recardo Loll, NP Palliative Medicine Team  Team Phone # [phone]  For individual providers, please see AMION

## 2023-10-30 NOTE — Progress Notes (Signed)
 PHARMACY - ANTICOAGULATION CONSULT NOTE  Pharmacy Consult for PTA eliquis  to heparin  Indication: atrial fibrillation  Allergies  Allergen Reactions   Nsaids Other (See Comments)    CKD stage 4   Lisinopril Other (See Comments)    coughing   Peanut-Containing Drug Products Itching and Other (See Comments)    GI intolerance - diarrhea    Patient Measurements: Height: 5' 7 (170.2 cm) Weight: 98.3 kg (216 lb 11.4 oz) IBW/kg (Calculated) : 61.6 Heparin  Dosing Weight: 83 kg  Vital Signs: Temp: 98.7 F (37.1 C) (02/05 0400) Temp Source: Oral (02/05 0400) BP: 162/72 (02/05 0400) Pulse Rate: 85 (02/05 0400)  Labs: Recent Labs    10/28/23 0522 10/29/23 0725 10/29/23 1806 10/30/23 0501  HGB 8.7* 8.3*  --  7.9*  HCT 27.9* 26.4*  --  24.6*  PLT 355 357  --  356  APTT  --   --  40* 39*  LABPROT 90.1*  --   --   --   INR >10.0*  --   --   --   HEPARINUNFRC  --   --  0.69 0.48  CREATININE 7.33* 8.43*  --  9.26*    Estimated Creatinine Clearance: 7 mL/min (A) (by C-G formula based on SCr of 9.26 mg/dL (H)).  Medications:  -PTA eliquis  2.5mg  PO BID (last dose 10/27/2023 AM)  Assessment: 36 yoF presents with abdominal pain with complicated diverticulitis w/ contained perforation and possible need for colectomy. PMH: CKD 5, A fib, CAD, and CVA    PM: aPTT 40 > 39 (subtherapeutic), HL 0.69 >0.48 - still some effects of DOAC on board. No issues with the heparin  infusion or bleeding reported per RN. Hgb low, stable, plts WNL  Goal of Therapy:  Heparin  level 0.3-0.7 units/ml aPTT 66-102 seconds Monitor platelets by anticoagulation protocol: Yes   Plan:  Increase heparin  infusion to 1700 units/hr Check aPTT level in 8 hours  Check heparin  level and aPTT daily until levels correlate, then switch to heparin  levels Continue to monitor H&H and platelets  Lynwood Poplar, PharmD. Clinical Pharmacist 10/30/2023 5:53 AM

## 2023-10-30 NOTE — Progress Notes (Signed)
 Progress Note     Subjective: Pt having some abdominal pain still but overall improved. Denies nausea or vomiting. Not having much bowel function. Discussed why PD is currently not recommended with intraabdominal infection. Patient fairly adamant that she would not want HD.   Objective: Vital signs in last 24 hours: Temp:  [97.5 F (36.4 C)-98.7 F (37.1 C)] 98.2 F (36.8 C) (02/05 0749) Pulse Rate:  [65-85] 65 (02/05 0749) Resp:  [15-20] 16 (02/05 0749) BP: (141-167)/(58-72) 167/70 (02/05 0749) SpO2:  [91 %-100 %] 95 % (02/05 0749)    Intake/Output from previous day: 02/04 0701 - 02/05 0700 In: 234.2 [I.V.:234.2] Out: -  Intake/Output this shift: No intake/output data recorded.  PE: General: pleasant, WD, WN female who is laying in bed in NAD Heart: regular, rate, and rhythm.   Lungs: Respiratory effort nonlabored Abd: soft, mildly TTP in LLQ and suprapubic abdomen without peritonitis, mild distention Psych: A&Ox3 with an appropriate affect.    Lab Results:  Recent Labs    10/29/23 0725 10/30/23 0501  WBC 17.9* 17.3*  HGB 8.3* 7.9*  HCT 26.4* 24.6*  PLT 357 356   BMET Recent Labs    10/29/23 0725 10/30/23 0501  NA 138 135  K 4.4 4.4  CL 103 102  CO2 19* 18*  GLUCOSE 178* 209*  BUN 95* 101*  CREATININE 8.43* 9.26*  CALCIUM  8.0* 7.9*   PT/INR Recent Labs    10/28/23 0522  LABPROT 90.1*  INR >10.0*   CMP     Component Value Date/Time   NA 135 10/30/2023 0501   NA 137 02/28/2021 1548   K 4.4 10/30/2023 0501   CL 102 10/30/2023 0501   CO2 18 (L) 10/30/2023 0501   GLUCOSE 209 (H) 10/30/2023 0501   BUN 101 (H) 10/30/2023 0501   BUN 33 (H) 02/28/2021 1548   CREATININE 9.26 (H) 10/30/2023 0501   CREATININE 3.38 (H) 12/26/2015 1347   CALCIUM  7.9 (L) 10/30/2023 0501   PROT 7.3 10/27/2023 1936   PROT 7.0 11/07/2017 1414   ALBUMIN  2.6 (L) 10/30/2023 0501   ALBUMIN  4.0 11/07/2017 1414   AST 17 10/27/2023 1936   ALT 9 10/27/2023 1936   ALKPHOS  44 10/27/2023 1936   BILITOT 1.0 10/27/2023 1936   BILITOT 0.2 11/07/2017 1414   GFRNONAA 4 (L) 10/30/2023 0501   GFRNONAA 13 (L) 01/10/2015 1428   GFRAA 21 (L) 12/21/2019 1802   GFRAA 16 (L) 01/10/2015 1428   Lipase     Component Value Date/Time   LIPASE 26 10/27/2023 1936       Studies/Results: No results found.   Anti-infectives: Anti-infectives (From admission, onward)    Start     Dose/Rate Route Frequency Ordered Stop   10/29/23 1100  piperacillin -tazobactam (ZOSYN ) IVPB 2.25 g        2.25 g 100 mL/hr over 30 Minutes Intravenous Every 8 hours 10/29/23 1006     10/28/23 0600  piperacillin -tazobactam (ZOSYN ) IVPB 2.25 g  Status:  Discontinued        2.25 g 100 mL/hr over 30 Minutes Intravenous Every 8 hours 10/27/23 2224 10/27/23 2235   10/28/23 0600  ceFEPIme  (MAXIPIME ) 1 g in sodium chloride  0.9 % 100 mL IVPB  Status:  Discontinued        1 g 200 mL/hr over 30 Minutes Intravenous Every 24 hours 10/27/23 2235 10/29/23 0954   10/28/23 0600  metroNIDAZOLE  (FLAGYL ) IVPB 500 mg  Status:  Discontinued  500 mg 100 mL/hr over 60 Minutes Intravenous Every 12 hours 10/27/23 2235 10/29/23 0954   10/27/23 2130  piperacillin -tazobactam (ZOSYN ) IVPB 3.375 g        3.375 g 100 mL/hr over 30 Minutes Intravenous  Once 10/27/23 2128 10/27/23 2320        Assessment/Plan  Sigmoid diverticulitis with contained perforation  - CT 2/2 with above, no abscess - hx of right hemicolectomy for polyps that were too large to be removed during colonoscopy  - WBC 17K, stable - continue CLD for now since not having much bowel function  - continue IV abx  - no indication for emergent surgical intervention at this time but we will continue to follow  FEN: CLD VTE: heparin  gtt ID: cefepime /flagyl  2/3>2/4; Zosyn  2/4>>  Discussed with TRH, plan for palliative consult today in setting of worsening failure to establish GOC since patient fairly adamant that she would not want HD  - per  TRH -  PAF  HTN HLD AKI on CKD stage V - Cr 9 today CAD T2DM  LOS: 3 days   I reviewed hospitalist notes, last 24 h vitals and pain scores, last 48 h intake and output, last 24 h labs and trends, and last 24 h imaging results.  This care required moderate level of medical decision making.    Burnard JONELLE Louder, [facility] 10/30/2023, 9:13 AM Please see Amion for pager number during day hours 7:00am-4:30pm

## 2023-10-30 NOTE — Consult Note (Signed)
 Reason for Consult: Acute kidney injury on chronic kidney disease stage V Referring Physician: Arna Applebaum M.D. [facility])  HPI:  68 year old woman with past medical history significant for hypertension, type 2 diabetes mellitus, atrial fibrillation on anticoagulation with Eliquis  (currently on hold), coronary artery disease, peripheral vascular disease, history of CVA and chronic kidney disease stage V.  She follows up with Dr. Prescilla at [facility] and was in the process for kidney transplant listing at [facility]; listing placed on hold because of her inability to ambulate adequately.  She was admitted to the hospital 2 days ago with left lower quadrant pain with some associated nausea and diarrhea in the setting of decreased oral intake.  Imaging shows acute diverticulitis of the descending colon with a contained perforation as well as inflamed jejunum adjacent to the perforated diverticulitis.  She is on active surveillance by the surgical service and currently does not have any acute indications for intervention while ongoing clear liquids.  Concern raised with worsening renal function; creatinine has risen from 7.0 on admission now to 9.3 with baseline creatinine typically around 6-6.5.  She is reluctant to consider hemodialysis understanding that this would likely be lifelong/chronic.   Past Medical History:  Diagnosis Date   Anemia    Arthritis    Atrial fibrillation (HCC)    CAD (coronary artery disease)    Chronic kidney disease, stage III (moderate) (HCC)    Constipation    Diabetes mellitus (HCC)    History of blood transfusion    HLD (hyperlipidemia)    Hypertension    Myocardial infarction [facility])    in April 2014    Past Surgical History:  Procedure Laterality Date   ANKLE SURGERY Right    x 6 total   BREAST BIOPSY Left    15 or 20 yearsa ago she cant remember laterality    HARDWARE REMOVAL Right 02/13/2013   Procedure: HARDWARE REMOVAL;  Surgeon: Jerona LULLA Sage, MD;  Location: [facility] OR;  Service: Orthopedics;  Laterality: Right;  Right Ankle Removal Hardware, Debridement, Place Wound VAC   laser surgery for diabetic retinopathy Bilateral    LEFT HEART CATHETERIZATION WITH CORONARY ANGIOGRAM N/A 01/09/2013   Procedure: LEFT HEART CATHETERIZATION WITH CORONARY ANGIOGRAM;  Surgeon: Toribio JONELLE Fuel, MD;  Location: [facility] CATH LAB;  Service: Cardiovascular;  Laterality: N/A;   ORIF ANKLE FRACTURE Right 01/12/2013   Procedure: OPEN REDUCTION INTERNAL FIXATION (ORIF) ANKLE FRACTURE;  Surgeon: Jerona LULLA Sage, MD;  Location: [facility] OR;  Service: Orthopedics;  Laterality: Right;   PARTIAL HYSTERECTOMY      Family History  Problem Relation Age of Onset   Diabetes Mother        No history CAD   Colon cancer Mother 66       died at 88 from CRC   Hypertension Father        Also had CAD   Heart disease Father    Cervical cancer Sister    Diabetes Sister    Congestive Heart Failure Sister    Diabetes Brother    Kidney disease Brother    Heart disease Maternal Grandmother    Sickle cell anemia Son    Thyroid  disease Son     Social History:  reports that she has never smoked. She has never used smokeless tobacco. She reports that she does not drink alcohol  and does not use drugs.  Allergies:  Allergies  Allergen Reactions   Nsaids Other (See Comments)    CKD stage 4  Lisinopril Other (See Comments)    coughing   Peanut-Containing Drug Products Itching and Other (See Comments)    GI intolerance - diarrhea    Medications: I have reviewed the patient's current medications. Scheduled:  amLODipine   5 mg Oral Daily   carvedilol   12.5 mg Oral BID WC   insulin  aspart  0-6 Units Subcutaneous TID WC   insulin  glargine-yfgn  8 Units Subcutaneous Daily   latanoprost   1 drop Both Eyes QHS   sodium bicarbonate   650 mg Oral BID   sodium chloride  flush  3 mL Intravenous Q12H   Continuous:  heparin  1,700 Units/hr (10/30/23 0946)   piperacillin -tazobactam  (ZOSYN )  IV 2.25 g (10/30/23 1313)      Latest Ref Rng & Units 10/30/2023    5:01 AM 10/29/2023    7:25 AM 10/28/2023    5:22 AM  BMP  Glucose 70 - 99 mg/dL 790  821  732   BUN 8 - 23 mg/dL 898  95  76   Creatinine 0.44 - 1.00 mg/dL 0.73  1.56  2.66   Sodium 135 - 145 mmol/L 135  138  134   Potassium 3.5 - 5.1 mmol/L 4.4  4.4  4.2   Chloride 98 - 111 mmol/L 102  103  102   CO2 22 - 32 mmol/L 18  19  18    Calcium  8.9 - 10.3 mg/dL 7.9  8.0  8.0       Latest Ref Rng & Units 10/30/2023    5:01 AM 10/29/2023    7:25 AM 10/28/2023    5:22 AM  CBC  WBC 4.0 - 10.5 K/uL 17.3  17.9  21.4   Hemoglobin 12.0 - 15.0 g/dL 7.9  8.3  8.7   Hematocrit 36.0 - 46.0 % 24.6  26.4  27.9   Platelets 150 - 400 K/uL 356  357  355     Review of Systems  Constitutional:  Positive for appetite change, chills and fatigue. Negative for fever.  HENT:  Negative for sore throat and trouble swallowing.   Eyes:  Negative for redness and visual disturbance.  Respiratory:  Negative for chest tightness and shortness of breath.   Cardiovascular:  Negative for chest pain and leg swelling.  Gastrointestinal:  Positive for abdominal pain and nausea.  Genitourinary:  Negative for difficulty urinating, dysuria and hematuria.  Musculoskeletal:  Negative for arthralgias, joint swelling and myalgias.  Skin:  Negative for rash and wound.  Neurological:  Positive for weakness. Negative for headaches.  Psychiatric/Behavioral:  Negative for confusion. The patient is not nervous/anxious.    Blood pressure (!) 140/58, pulse 71, temperature 98.5 F (36.9 C), temperature source Oral, resp. rate 15, height 5' 7 (1.702 m), weight 98.3 kg, SpO2 96%. Physical Exam Vitals and nursing note reviewed.  Constitutional:      Appearance: She is well-developed. She is obese. She is ill-appearing.  HENT:     Head: Normocephalic and atraumatic.     Mouth/Throat:     Mouth: Mucous membranes are moist.     Pharynx: Oropharynx is clear.  Eyes:      Pupils: Pupils are equal, round, and reactive to light.  Cardiovascular:     Rate and Rhythm: Normal rate and regular rhythm.     Heart sounds: Normal heart sounds.  Pulmonary:     Effort: Pulmonary effort is normal.     Breath sounds: Normal breath sounds.  Abdominal:     General: Abdomen is flat and protuberant. Bowel  sounds are normal. There is no distension.     Palpations: Abdomen is soft.     Tenderness: There is abdominal tenderness in the left lower quadrant. There is guarding.  Musculoskeletal:     Right lower leg: No edema.     Left lower leg: No edema.  Neurological:     Mental Status: She is alert and oriented to person, place, and time.     Assessment/Plan: 1.  Acute kidney injury on chronic kidney disease stage V: Advanced chronic kidney disease at baseline, will combination of diabetes and hypertension.  Acute injury appears to be hemodynamically mediated in the setting of acute diverticulitis with contained perforation.  Proteinuria on urinalysis appears to be consistent with history of diabetic kidney disease rather than acute GN/acute source.  Will start supportive management with intravenous fluids and continue to discuss daily regarding intentions for renal replacement therapy.  I have made it clear to her that if we did undertake dialysis, this would likely be long-term.  She is apprehensive because she does not observe her brother (on dialysis in [place] ) having a good quality of life. 2.  Acute diverticulitis with contained perforation of descending colon: On Zosyn  and ongoing pain management. 3.  Anion gap metabolic acidosis: Likely secondary to combination of acute kidney injury and ongoing infection, monitor with sodium bicarbonate . 4.  Anemia: Likely secondary to chronic illness, will check iron  studies to decide on additional management including IV iron /ESA.  Gordy MARLA Blanch 10/30/2023, 1:29 PM

## 2023-10-30 NOTE — Progress Notes (Addendum)
 PHARMACY - ANTICOAGULATION CONSULT NOTE  Pharmacy Consult:  Heparin  Indication: atrial fibrillation  Allergies  Allergen Reactions   Nsaids Other (See Comments)    CKD stage 4   Lisinopril Other (See Comments)    coughing   Peanut-Containing Drug Products Itching and Other (See Comments)    GI intolerance - diarrhea    Patient Measurements: Height: 5' 7 (170.2 cm) Weight: 98.3 kg (216 lb 11.4 oz) IBW/kg (Calculated) : 61.6 Heparin  Dosing Weight: 83 kg  Vital Signs: Temp: 98.5 F (36.9 C) (02/05 1154) Temp Source: Oral (02/05 1154) BP: 139/58 (02/05 1702) Pulse Rate: 85 (02/05 1702)  Labs: Recent Labs    10/28/23 0522 10/29/23 0725 10/29/23 1806 10/30/23 0501 10/30/23 1427  HGB 8.7* 8.3*  --  7.9*  --   HCT 27.9* 26.4*  --  24.6*  --   PLT 355 357  --  356  --   APTT  --   --  40* 39* >200*  LABPROT 90.1*  --   --   --   --   INR >10.0*  --   --   --   --   HEPARINUNFRC  --   --  0.69 0.48  --   CREATININE 7.33* 8.43*  --  9.26*  --     Estimated Creatinine Clearance: 7 mL/min (A) (by C-G formula based on SCr of 9.26 mg/dL (H)).  Medications:  -PTA eliquis  2.5mg  PO BID (last dose 10/27/2023 AM)  Assessment: 36 yoF presents with abdominal pain with complicated diverticulitis w/ contained perforation and possible need for colectomy. PMH: CKD 5, A fib, CAD, and CVA.  Pharmacy consulted to dose IV heparin  for Afib.  aPTT > 200 sec.  Patient reports that lab was drawn from the IV site, so it is contaminated.  Goal of Therapy:  Heparin  level 0.3-0.7 units/ml aPTT 66-102 seconds Monitor platelets by anticoagulation protocol: Yes   Plan:  Continue heparin  infusion at 1700 units/hr Check aPTT stat Check heparin  level and aPTT daily until levels correlate, then switch to heparin  levels Continue to monitor H&H and platelets  Lanae Federer D. Lendell, PharmD, BCPS, BCCCP 10/30/2023, 6:13 PM  ========================  Addendum: Repeat aPTT sub-therapeutic at 54  sec Increase IV heparin  to 1950 units/hr Check 8 hr aPTT  Almetta Liddicoat D. Lendell, PharmD, BCPS, BCCCP 10/30/2023, 9:01 PM

## 2023-10-30 NOTE — Progress Notes (Signed)
 PROGRESS NOTE        PATIENT DETAILS Name: [name] Age: 68 y.o. Sex: female Date of Birth: 1956-08-04 Admit Date: 10/27/2023 Admitting Physician Dorn Dawson, MD ERE:Duntz, Damian, NP  Brief Summary: Patient is a 68 y.o.  female with history of CKD 5, A-fib on Eliquis , CAD-who presented with abdominal pain-found to have acute diverticulitis with contained perforation involving the descending colon.  Significant events: 2/2>> admit to [facility]  Significant studies: 2/2>> CT abdomen/pelvis: Acute diverticulitis-descending colon with contained perforation.  Significant microbiology data: 2/2>> COVID/influenza/RSV PCR: Negative  Procedures: None  Consults: Surgery  Subjective: Tolerating clear liquids-some pain in the lower abdominal area but better.  Objective: Vitals: Blood pressure (!) 167/70, pulse 65, temperature 98.2 F (36.8 C), temperature source Oral, resp. rate 16, height 5' 7 (1.702 m), weight 98.3 kg, SpO2 95%.   Exam: Gen Exam:Alert awake-not in any distress HEENT:atraumatic, normocephalic Chest: B/L clear to auscultation anteriorly CVS:S1S2 regular Abdomen:soft-continues to have some mild tenderness in the lower abdominal area-specially in the LLQ-no peritoneal signs. Extremities:no edema Neurology: Non focal Skin: no rash  Pertinent Labs/Radiology:    Latest Ref Rng & Units 10/30/2023    5:01 AM 10/29/2023    7:25 AM 10/28/2023    5:22 AM  CBC  WBC 4.0 - 10.5 K/uL 17.3  17.9  21.4   Hemoglobin 12.0 - 15.0 g/dL 7.9  8.3  8.7   Hematocrit 36.0 - 46.0 % 24.6  26.4  27.9   Platelets 150 - 400 K/uL 356  357  355     Lab Results  Component Value Date   NA 135 10/30/2023   K 4.4 10/30/2023   CL 102 10/30/2023   CO2 18 (L) 10/30/2023      Assessment/Plan: Acute diverticulitis with contained perforation of the descending colon Slowly improving-tolerating clear liquids-continues to have some lower abdominal  tenderness Continue empiric antibiotics General Surgery following with plans to repeat imaging later today.    AKI on CKD 5 Recent baseline creatinine around 4-suspect has developed AKI in the setting of diverticulitis with contained perforation Volume status reasonable-electrolytes stable but BUN/creatinine trending up-Long discussion again today-initially she was adamant that even in the face of a life-threatening event-she would not want dialysis. given that she has fairly advanced CKD at baseline-this very well may represent ESRD-HD likely will not be short-term.   She apparently has a brother who does HD-and she does not want to undergo HD based on his experience.  However later this morning-her 2 sons have come to bedside-and she is now rethinking her hemodialysis options.  Have formally consulted nephrology and palliative care. Will continue to engage with patient/family Avoid nephrotoxic agents Follow renal function  CAD Currently no anginal symptoms Not on aspirin -as on anticoagulation Beta-blocker being resumed On Repatha injections as an outpatient  PAF Sinus rhythm Continue Coreg  Eliquis  on hold-on IV heparin  as patient may require procedures.   HTN BP creeping up Increase Coreg  to 12.5 mg twice daily-resume amlodipine  Follow/optimize and add hydralazine  accordingly.    DM-2 (A1c 6.6 on 2/3) CBG on the higher side Increase Semglee  to 8 units Continue SSI Follow/optimize.  Recent Labs    10/29/23 1551 10/29/23 2137 10/30/23 0749  GLUCAP 190* 214* 206*     Class 1 Obesity: Estimated body mass index is 33.94 kg/m as calculated from the following:  Height as of this encounter: 5' 7 (1.702 m).   Weight as of this encounter: 98.3 kg.   Code status:   Code Status: Do not attempt resuscitation (DNR) PRE-ARREST INTERVENTIONS DESIRED   DVT Prophylaxis: SCDs Start: 10/27/23 2221 IV Heparin   Family Communication: 2 sons at bedside on 2/5. Son-[name] updated 2/4   Disposition Plan: Status is: Inpatient Remains inpatient appropriate because: Severity of illness   Planned Discharge Destination:Home health   Diet: Diet Order             Diet clear liquid Room service appropriate? Yes; Fluid consistency: Thin  Diet effective now                     Antimicrobial agents: Anti-infectives (From admission, onward)    Start     Dose/Rate Route Frequency Ordered Stop   10/29/23 1100  piperacillin -tazobactam (ZOSYN ) IVPB 2.25 g        2.25 g 100 mL/hr over 30 Minutes Intravenous Every 8 hours 10/29/23 1006     10/28/23 0600  piperacillin -tazobactam (ZOSYN ) IVPB 2.25 g  Status:  Discontinued        2.25 g 100 mL/hr over 30 Minutes Intravenous Every 8 hours 10/27/23 2224 10/27/23 2235   10/28/23 0600  ceFEPIme  (MAXIPIME ) 1 g in sodium chloride  0.9 % 100 mL IVPB  Status:  Discontinued        1 g 200 mL/hr over 30 Minutes Intravenous Every 24 hours 10/27/23 2235 10/29/23 0954   10/28/23 0600  metroNIDAZOLE  (FLAGYL ) IVPB 500 mg  Status:  Discontinued        500 mg 100 mL/hr over 60 Minutes Intravenous Every 12 hours 10/27/23 2235 10/29/23 0954   10/27/23 2130  piperacillin -tazobactam (ZOSYN ) IVPB 3.375 g        3.375 g 100 mL/hr over 30 Minutes Intravenous  Once 10/27/23 2128 10/27/23 2320        MEDICATIONS: Scheduled Meds:  carvedilol   6.25 mg Oral BID WC   insulin  aspart  0-6 Units Subcutaneous TID WC   insulin  glargine-yfgn  8 Units Subcutaneous Daily   latanoprost   1 drop Both Eyes QHS   sodium bicarbonate   650 mg Oral BID   sodium chloride  flush  3 mL Intravenous Q12H   Continuous Infusions:  heparin  1,700 Units/hr (10/30/23 0946)   piperacillin -tazobactam (ZOSYN )  IV 2.25 g (10/30/23 0414)   PRN Meds:.HYDROmorphone  (DILAUDID ) injection **OR** HYDROmorphone  (DILAUDID ) injection, ondansetron  (ZOFRAN ) IV   I have personally reviewed following labs and imaging studies  LABORATORY  DATA: CBC: Recent Labs  Lab 10/27/23 1936 10/28/23 0522 10/29/23 0725 10/30/23 0501  WBC 16.8* 21.4* 17.9* 17.3*  NEUTROABS 15.2*  --   --   --   HGB 9.8* 8.7* 8.3* 7.9*  HCT 31.5* 27.9* 26.4* 24.6*  MCV 82.9 83.3 82.2 81.2  PLT 435* 355 357 356    Basic Metabolic Panel: Recent Labs  Lab 10/27/23 1936 10/28/23 0522 10/29/23 0725 10/30/23 0501  NA 134* 134* 138 135  K 4.8 4.2 4.4 4.4  CL 100 102 103 102  CO2 18* 18* 19* 18*  GLUCOSE 234* 267* 178* 209*  BUN 77* 76* 95* 101*  CREATININE 7.05* 7.33* 8.43* 9.26*  CALCIUM  8.4* 8.0* 8.0* 7.9*  MG  --  2.2  --   --   PHOS  --  6.7* 7.1* 6.8*    GFR: Estimated Creatinine Clearance: 7 mL/min (A) (by C-G formula based on SCr of 9.26 mg/dL (H)).  Liver Function Tests: Recent Labs  Lab 10/27/23 1936 10/29/23 0725 10/30/23 0501  AST 17  --   --   ALT 9  --   --   ALKPHOS 44  --   --   BILITOT 1.0  --   --   PROT 7.3  --   --   ALBUMIN  3.4* 2.8* 2.6*   Recent Labs  Lab 10/27/23 1936  LIPASE 26   No results for input(s): AMMONIA in the last 168 hours.  Coagulation Profile: Recent Labs  Lab 10/28/23 0522  INR >10.0*    Cardiac Enzymes: No results for input(s): CKTOTAL, CKMB, CKMBINDEX, TROPONINI in the last 168 hours.  BNP (last 3 results) No results for input(s): PROBNP in the last 8760 hours.  Lipid Profile: No results for input(s): CHOL, HDL, LDLCALC, TRIG, CHOLHDL, LDLDIRECT in the last 72 hours.  Thyroid  Function Tests: No results for input(s): TSH, T4TOTAL, FREET4, T3FREE, THYROIDAB in the last 72 hours.  Anemia Panel: No results for input(s): VITAMINB12, FOLATE, FERRITIN, TIBC, IRON , RETICCTPCT in the last 72 hours.  Urine analysis:    Component Value Date/Time   COLORURINE YELLOW 10/28/2023 0416   APPEARANCEUR HAZY (A) 10/28/2023 0416   LABSPEC 1.010 10/28/2023 0416   PHURINE 7.0 10/28/2023 0416   GLUCOSEU 150 (A) 10/28/2023 0416   GLUCOSEU  NEGATIVE 05/05/2021 1228   HGBUR NEGATIVE 10/28/2023 0416   BILIRUBINUR NEGATIVE 10/28/2023 0416   KETONESUR NEGATIVE 10/28/2023 0416   PROTEINUR >=300 (A) 10/28/2023 0416   UROBILINOGEN 0.2 05/05/2021 1228   NITRITE NEGATIVE 10/28/2023 0416   LEUKOCYTESUR MODERATE (A) 10/28/2023 0416    Sepsis Labs: Lactic Acid, Venous    Component Value Date/Time   LATICACIDVEN 1.0 10/27/2023 2240    MICROBIOLOGY: Recent Results (from the past 240 hours)  Resp panel by RT-PCR (RSV, Flu A&B, Covid) Anterior Nasal Swab     Status: None   Collection Time: 10/27/23  7:18 PM   Specimen: Anterior Nasal Swab  Result Value Ref Range Status   SARS Coronavirus 2 by RT PCR NEGATIVE NEGATIVE Final   Influenza A by PCR NEGATIVE NEGATIVE Final   Influenza B by PCR NEGATIVE NEGATIVE Final    Comment: (NOTE) The Xpert Xpress SARS-CoV-2/FLU/RSV plus assay is intended as an aid in the diagnosis of influenza from Nasopharyngeal swab specimens and should not be used as a sole basis for treatment. Nasal washings and aspirates are unacceptable for Xpert Xpress SARS-CoV-2/FLU/RSV testing.  Fact Sheet for Patients: bloggercourse.com  Fact Sheet for Healthcare Providers: seriousbroker.it  This test is not yet approved or cleared by the [place]  FDA and has been authorized for detection and/or diagnosis of SARS-CoV-2 by FDA under an Emergency Use Authorization (EUA). This EUA will remain in effect (meaning this test can be used) for the duration of the COVID-19 declaration under Section 564(b)(1) of the Act, 21 U.S.C. section 360bbb-3(b)(1), unless the authorization is terminated or revoked.     Resp Syncytial Virus by PCR NEGATIVE NEGATIVE Final    Comment: (NOTE) Fact Sheet for Patients: bloggercourse.com  Fact Sheet for Healthcare Providers: seriousbroker.it  This test is not yet approved or  cleared by the [place]  FDA and has been authorized for detection and/or diagnosis of SARS-CoV-2 by FDA under an Emergency Use Authorization (EUA). This EUA will remain in effect (meaning this test can be used) for the duration of the COVID-19 declaration under Section 564(b)(1) of the Act, 21 U.S.C. section 360bbb-3(b)(1), unless the authorization is terminated or  revoked.  Performed at [facility], [address], [place], [place] [address]     RADIOLOGY STUDIES/RESULTS: No results found.    LOS: 3 days   Donalda Applebaum, MD  [facility]    To contact the attending provider between 7A-7P or the covering provider during after hours 7P-7A, please log into the web site [email or web address] and access using universal [place] password for that web site. If you do not have the password, please call the hospital operator.  10/30/2023, 11:15 AM

## 2023-10-30 NOTE — Progress Notes (Signed)
 Increase semglee  to 8 units/day per Dr. Hilton Lucky.  Ivery Marking, PharmD, BCIDP, AAHIVP, CPP Infectious Disease Pharmacist 10/30/2023 9:33 AM

## 2023-10-31 ENCOUNTER — Inpatient Hospital Stay (HOSPITAL_COMMUNITY): Payer: Medicare PPO

## 2023-10-31 DIAGNOSIS — N185 Chronic kidney disease, stage 5: Secondary | ICD-10-CM | POA: Diagnosis not present

## 2023-10-31 DIAGNOSIS — Z7189 Other specified counseling: Secondary | ICD-10-CM | POA: Diagnosis not present

## 2023-10-31 DIAGNOSIS — Z515 Encounter for palliative care: Secondary | ICD-10-CM | POA: Diagnosis not present

## 2023-10-31 DIAGNOSIS — D649 Anemia, unspecified: Secondary | ICD-10-CM | POA: Diagnosis not present

## 2023-10-31 DIAGNOSIS — I480 Paroxysmal atrial fibrillation: Secondary | ICD-10-CM | POA: Diagnosis not present

## 2023-10-31 DIAGNOSIS — K5780 Diverticulitis of intestine, part unspecified, with perforation and abscess without bleeding: Secondary | ICD-10-CM | POA: Diagnosis not present

## 2023-10-31 DIAGNOSIS — N179 Acute kidney failure, unspecified: Secondary | ICD-10-CM | POA: Diagnosis not present

## 2023-10-31 DIAGNOSIS — K5732 Diverticulitis of large intestine without perforation or abscess without bleeding: Secondary | ICD-10-CM | POA: Diagnosis not present

## 2023-10-31 LAB — CBC
HCT: 22.7 % — ABNORMAL LOW (ref 36.0–46.0)
Hemoglobin: 7.4 g/dL — ABNORMAL LOW (ref 12.0–15.0)
MCH: 26.1 pg (ref 26.0–34.0)
MCHC: 32.6 g/dL (ref 30.0–36.0)
MCV: 79.9 fL — ABNORMAL LOW (ref 80.0–100.0)
Platelets: 337 10*3/uL (ref 150–400)
RBC: 2.84 MIL/uL — ABNORMAL LOW (ref 3.87–5.11)
RDW: 14.8 % (ref 11.5–15.5)
WBC: 13.7 10*3/uL — ABNORMAL HIGH (ref 4.0–10.5)
nRBC: 0 % (ref 0.0–0.2)

## 2023-10-31 LAB — RENAL FUNCTION PANEL
Albumin: 2.4 g/dL — ABNORMAL LOW (ref 3.5–5.0)
Anion gap: 14 (ref 5–15)
BUN: 100 mg/dL — ABNORMAL HIGH (ref 8–23)
CO2: 18 mmol/L — ABNORMAL LOW (ref 22–32)
Calcium: 7.6 mg/dL — ABNORMAL LOW (ref 8.9–10.3)
Chloride: 102 mmol/L (ref 98–111)
Creatinine, Ser: 9.58 mg/dL — ABNORMAL HIGH (ref 0.44–1.00)
GFR, Estimated: 4 mL/min — ABNORMAL LOW (ref >60)
Glucose, Bld: 152 mg/dL — ABNORMAL HIGH (ref 70–99)
Phosphorus: 6.1 mg/dL — ABNORMAL HIGH (ref 2.5–4.6)
Potassium: 4.1 mmol/L (ref 3.5–5.1)
Sodium: 134 mmol/L — ABNORMAL LOW (ref 135–145)

## 2023-10-31 LAB — HEPARIN LEVEL (UNFRACTIONATED)
Heparin Unfractionated: 0.58 [IU]/mL (ref 0.30–0.70)
Heparin Unfractionated: 0.69 [IU]/mL (ref 0.30–0.70)

## 2023-10-31 LAB — GLUCOSE, CAPILLARY
Glucose-Capillary: 143 mg/dL — ABNORMAL HIGH (ref 70–99)
Glucose-Capillary: 137 mg/dL — ABNORMAL HIGH (ref 70–99)
Glucose-Capillary: 168 mg/dL — ABNORMAL HIGH (ref 70–99)
Glucose-Capillary: 173 mg/dL — ABNORMAL HIGH (ref 70–99)

## 2023-10-31 LAB — APTT: aPTT: 76 s — ABNORMAL HIGH (ref 24–36)

## 2023-10-31 MED ORDER — IPRATROPIUM-ALBUTEROL 0.5-2.5 (3) MG/3ML IN SOLN: 3.0000 mL | Freq: Four times a day (QID) | RESPIRATORY_TRACT | Status: DC | PRN

## 2023-10-31 MED ORDER — ACETAMINOPHEN 325 MG PO TABS
650.0000 mg | ORAL_TABLET | Freq: Four times a day (QID) | ORAL | Status: DC | PRN
  Administered 2023-10-31: 650 mg via ORAL
  Filled 2023-10-31: qty 2

## 2023-10-31 NOTE — Progress Notes (Signed)
 PROGRESS NOTE        PATIENT DETAILS Name: [name] Age: 68 y.o. Sex: female Date of Birth: [birth year] Admit Date: 10/27/2023 Admitting Physician Dorn Dawson, MD ERE:Duntz, Damian, NP  Brief Summary: Patient is a 68 y.o.  female with history of CKD 5, A-fib on Eliquis , CAD-who presented with abdominal pain-found to have acute diverticulitis with contained perforation involving the descending colon.  Significant events: 2/2>> admit to [facility]  Significant studies: 2/2>> CT abdomen/pelvis: Acute diverticulitis-descending colon with contained perforation.  Significant microbiology data: 2/2>> COVID/influenza/RSV PCR: Negative  Procedures: None  Consults: Surgery  Subjective: Lower abdominal pain much better-tolerating liquids.  She has not yet decided she would want to do-in terms of dialysis.  Objective: Vitals: Blood pressure 134/60, pulse 74, temperature 98.1 F (36.7 C), temperature source Oral, resp. rate 20, height 5' 7 (1.702 m), weight 97.1 kg, SpO2 98%.   Exam: Gen Exam:Alert awake-not in any distress HEENT:atraumatic, normocephalic Chest: B/L clear to auscultation anteriorly CVS:S1S2 regular Abdomen: Soft-mildly tender in the lower abdomen without any peritoneal signs. Extremities:no edema Neurology: Non focal Skin: no rash  Pertinent Labs/Radiology:    Latest Ref Rng & Units 10/31/2023    5:03 AM 10/30/2023    5:01 AM 10/29/2023    7:25 AM  CBC  WBC 4.0 - 10.5 K/uL 13.7  17.3  17.9   Hemoglobin 12.0 - 15.0 g/dL 7.4  7.9  8.3   Hematocrit 36.0 - 46.0 % 22.7  24.6  26.4   Platelets 150 - 400 K/uL 337  356  357     Lab Results  Component Value Date   NA 134 (L) 10/31/2023   K 4.1 10/31/2023   CL 102 10/31/2023   CO2 18 (L) 10/31/2023      Assessment/Plan: Acute diverticulitis with contained perforation of the descending colon Slowly improving Tolerating liquids IV Zosyn  General Surgery-planning to repeat CT  imaging today.   AKI on CKD 5 AKI likely hemodynamically mediated-history of hypothyroidism-although no acute indications to start HD-suspect she is heading to ESRD at this point.  She is reluctant to start dialysis-her children however want her to start HD-she is reconsidering HD at the request of her children. Nephrology/palliative care consulted-await recommendations Avoid nephrotoxic agents Following electrolytes  CAD Currently no anginal symptoms Not on aspirin -as on anticoagulation Continue Beta Blocker On Repatha injections as an outpatient  PAF Sinus rhythm Continue Coreg  Eliquis  on hold-on IV heparin  as patient may require procedures.  HTN BP stable Coreg /amlodipine  Follow/optimize and add hydralazine  accordingly.    DM-2 (A1c 6.6 on 2/3) CBG's better Semglee  8 units+ SSI Follow/optimize.  Recent Labs    10/30/23 1652 10/30/23 2028 10/31/23 0748  GLUCAP 215* 180* 143*     Class 1 Obesity: Estimated body mass index is 33.53 kg/m as calculated from the following:   Height as of this encounter: 5' 7 (1.702 m).   Weight as of this encounter: 97.1 kg.   Code status:   Code Status: Do not attempt resuscitation (DNR) PRE-ARREST INTERVENTIONS DESIRED   DVT Prophylaxis: SCDs Start: 10/27/23 2221 IV Heparin   Family Communication: 2 sons at bedside on 2/5. Son-[name] updated 2/4   Disposition Plan: Status is: Inpatient Remains inpatient appropriate because: Severity of illness   Planned Discharge Destination:Home health   Diet: Diet Order  Diet clear liquid Room service appropriate? Yes; Fluid consistency: Thin  Diet effective now                     Antimicrobial agents: Anti-infectives (From admission, onward)    Start     Dose/Rate Route Frequency Ordered Stop   10/29/23 1100  piperacillin -tazobactam (ZOSYN ) IVPB 2.25 g        2.25 g 100 mL/hr over 30 Minutes Intravenous Every 8 hours 10/29/23 1006      10/28/23 0600  piperacillin -tazobactam (ZOSYN ) IVPB 2.25 g  Status:  Discontinued        2.25 g 100 mL/hr over 30 Minutes Intravenous Every 8 hours 10/27/23 2224 10/27/23 2235   10/28/23 0600  ceFEPIme  (MAXIPIME ) 1 g in sodium chloride  0.9 % 100 mL IVPB  Status:  Discontinued        1 g 200 mL/hr over 30 Minutes Intravenous Every 24 hours 10/27/23 2235 10/29/23 0954   10/28/23 0600  metroNIDAZOLE  (FLAGYL ) IVPB 500 mg  Status:  Discontinued        500 mg 100 mL/hr over 60 Minutes Intravenous Every 12 hours 10/27/23 2235 10/29/23 0954   10/27/23 2130  piperacillin -tazobactam (ZOSYN ) IVPB 3.375 g        3.375 g 100 mL/hr over 30 Minutes Intravenous  Once 10/27/23 2128 10/27/23 2320        MEDICATIONS: Scheduled Meds:  amLODipine   5 mg Oral Daily   carvedilol   12.5 mg Oral BID WC   insulin  aspart  0-6 Units Subcutaneous TID WC   insulin  glargine-yfgn  8 Units Subcutaneous Daily   latanoprost   1 drop Both Eyes QHS   sodium bicarbonate   650 mg Oral BID   sodium chloride  flush  3 mL Intravenous Q12H   Continuous Infusions:  heparin  1,950 Units/hr (10/31/23 0136)   lactated ringers  Stopped (10/31/23 0831)   piperacillin -tazobactam (ZOSYN )  IV 2.25 g (10/31/23 0448)   PRN Meds:.HYDROmorphone  (DILAUDID ) injection **OR** HYDROmorphone  (DILAUDID ) injection, ondansetron  (ZOFRAN ) IV   I have personally reviewed following labs and imaging studies  LABORATORY DATA: CBC: Recent Labs  Lab 10/27/23 1936 10/28/23 0522 10/29/23 0725 10/30/23 0501 10/31/23 0503  WBC 16.8* 21.4* 17.9* 17.3* 13.7*  NEUTROABS 15.2*  --   --   --   --   HGB 9.8* 8.7* 8.3* 7.9* 7.4*  HCT 31.5* 27.9* 26.4* 24.6* 22.7*  MCV 82.9 83.3 82.2 81.2 79.9*  PLT 435* 355 357 356 337    Basic Metabolic Panel: Recent Labs  Lab 10/27/23 1936 10/28/23 0522 10/29/23 0725 10/30/23 0501 10/31/23 0503  NA 134* 134* 138 135 134*  K 4.8 4.2 4.4 4.4 4.1  CL 100 102 103 102 102  CO2 18* 18* 19* 18* 18*  GLUCOSE 234*  267* 178* 209* 152*  BUN 77* 76* 95* 101* 100*  CREATININE 7.05* 7.33* 8.43* 9.26* 9.58*  CALCIUM  8.4* 8.0* 8.0* 7.9* 7.6*  MG  --  2.2  --   --   --   PHOS  --  6.7* 7.1* 6.8* 6.1*    GFR: Estimated Creatinine Clearance: 6.7 mL/min (A) (by C-G formula based on SCr of 9.58 mg/dL (H)).  Liver Function Tests: Recent Labs  Lab 10/27/23 1936 10/29/23 0725 10/30/23 0501 10/31/23 0503  AST 17  --   --   --   ALT 9  --   --   --   ALKPHOS 44  --   --   --   BILITOT 1.0  --   --   --  PROT 7.3  --   --   --   ALBUMIN  3.4* 2.8* 2.6* 2.4*   Recent Labs  Lab 10/27/23 1936  LIPASE 26   No results for input(s): AMMONIA in the last 168 hours.  Coagulation Profile: Recent Labs  Lab 10/28/23 0522  INR >10.0*    Cardiac Enzymes: No results for input(s): CKTOTAL, CKMB, CKMBINDEX, TROPONINI in the last 168 hours.  BNP (last 3 results) No results for input(s): PROBNP in the last 8760 hours.  Lipid Profile: No results for input(s): CHOL, HDL, LDLCALC, TRIG, CHOLHDL, LDLDIRECT in the last 72 hours.  Thyroid  Function Tests: No results for input(s): TSH, T4TOTAL, FREET4, T3FREE, THYROIDAB in the last 72 hours.  Anemia Panel: Recent Labs    10/30/23 1427  FERRITIN 140  TIBC 181*  IRON  12*    Urine analysis:    Component Value Date/Time   COLORURINE YELLOW 10/28/2023 0416   APPEARANCEUR HAZY (A) 10/28/2023 0416   LABSPEC 1.010 10/28/2023 0416   PHURINE 7.0 10/28/2023 0416   GLUCOSEU 150 (A) 10/28/2023 0416   GLUCOSEU NEGATIVE 05/05/2021 1228   HGBUR NEGATIVE 10/28/2023 0416   BILIRUBINUR NEGATIVE 10/28/2023 0416   KETONESUR NEGATIVE 10/28/2023 0416   PROTEINUR >=300 (A) 10/28/2023 0416   UROBILINOGEN 0.2 05/05/2021 1228   NITRITE NEGATIVE 10/28/2023 0416   LEUKOCYTESUR MODERATE (A) 10/28/2023 0416    Sepsis Labs: Lactic Acid, Venous    Component Value Date/Time   LATICACIDVEN 1.0 10/27/2023 2240    MICROBIOLOGY: Recent  Results (from the past 240 hours)  Resp panel by RT-PCR (RSV, Flu A&B, Covid) Anterior Nasal Swab     Status: None   Collection Time: 10/27/23  7:18 PM   Specimen: Anterior Nasal Swab  Result Value Ref Range Status   SARS Coronavirus 2 by RT PCR NEGATIVE NEGATIVE Final   Influenza A by PCR NEGATIVE NEGATIVE Final   Influenza B by PCR NEGATIVE NEGATIVE Final    Comment: (NOTE) The Xpert Xpress SARS-CoV-2/FLU/RSV plus assay is intended as an aid in the diagnosis of influenza from Nasopharyngeal swab specimens and should not be used as a sole basis for treatment. Nasal washings and aspirates are unacceptable for Xpert Xpress SARS-CoV-2/FLU/RSV testing.  Fact Sheet for Patients: bloggercourse.com  Fact Sheet for Healthcare Providers: seriousbroker.it  This test is not yet approved or cleared by the [place]  FDA and has been authorized for detection and/or diagnosis of SARS-CoV-2 by FDA under an Emergency Use Authorization (EUA). This EUA will remain in effect (meaning this test can be used) for the duration of the COVID-19 declaration under Section 564(b)(1) of the Act, 21 U.S.C. section 360bbb-3(b)(1), unless the authorization is terminated or revoked.     Resp Syncytial Virus by PCR NEGATIVE NEGATIVE Final    Comment: (NOTE) Fact Sheet for Patients: bloggercourse.com  Fact Sheet for Healthcare Providers: seriousbroker.it  This test is not yet approved or cleared by the [place]  FDA and has been authorized for detection and/or diagnosis of SARS-CoV-2 by FDA under an Emergency Use Authorization (EUA). This EUA will remain in effect (meaning this test can be used) for the duration of the COVID-19 declaration under Section 564(b)(1) of the Act, 21 U.S.C. section 360bbb-3(b)(1), unless the authorization is terminated or revoked.  Performed at [facility], [address] [place], [place] [address]     RADIOLOGY STUDIES/RESULTS: No results found.    LOS: 4 days   Donalda Applebaum, MD  [facility]    To contact  the attending provider between 7A-7P or the covering provider during after hours 7P-7A, please log into the web site [email or web address] and access using universal [place] password for that web site. If you do not have the password, please call the hospital operator.  10/31/2023, 10:21 AM

## 2023-10-31 NOTE — Progress Notes (Signed)
 PT Cancellation Note  Patient Details Name: [name] MRN: [id number] DOB: 1956-09-17   Cancelled Treatment:    Reason Eval/Treat Not Completed: Other (comment) (Pt meeting with palliative. Will follow up later if time allows.)   Alysia Scism 10/31/2023, 2:31 PM

## 2023-10-31 NOTE — Progress Notes (Signed)
 PHARMACY - ANTICOAGULATION CONSULT NOTE  Pharmacy Consult:  Heparin  Indication: atrial fibrillation  Allergies  Allergen Reactions   Nsaids Other (See Comments)    CKD stage 4   Lisinopril Other (See Comments)    coughing   Peanut-Containing Drug Products Itching and Other (See Comments)    GI intolerance - diarrhea    Patient Measurements: Height: 5' 7 (170.2 cm) Weight: 97.1 kg (214 lb 1.6 oz) IBW/kg (Calculated) : 61.6 Heparin  Dosing Weight: 83 kg  Vital Signs: Temp: 97.8 F (36.6 C) (02/06 0400) Temp Source: Oral (02/06 0400) BP: 129/60 (02/06 0400) Pulse Rate: 68 (02/06 0400)  Labs: Recent Labs    10/29/23 0725 10/29/23 0725 10/29/23 1806 10/30/23 0501 10/30/23 1427 10/30/23 1836 10/31/23 0503  HGB 8.3*  --   --  7.9*  --   --  7.4*  HCT 26.4*  --   --  24.6*  --   --  22.7*  PLT 357  --   --  356  --   --  337  APTT  --    < > 40* 39* >200* 54* 76*  HEPARINUNFRC  --   --  0.69 0.48  --   --  0.58  CREATININE 8.43*  --   --  9.26*  --   --   --    < > = values in this interval not displayed.    Estimated Creatinine Clearance: 7 mL/min (A) (by C-G formula based on SCr of 9.26 mg/dL (H)).  Medications:  -PTA eliquis  2.5mg  PO BID (last dose 10/27/2023 AM)  Assessment: 69 yoF presents with abdominal pain with complicated diverticulitis w/ contained perforation and possible need for colectomy. PMH: CKD 5, A fib, CAD, and CVA.  Pharmacy consulted to dose IV heparin  for Afib.  2/6 AM update:  Heparin  level therapeutic HL/aPTT correlating-will DC further aPTT's  Goal of Therapy:  Heparin  level 0.3-0.7 units/ml Monitor platelets by anticoagulation protocol: Yes   Plan:  Cont heparin  1950 units/hr 1400 heparin  level  Lynwood Mckusick, PharmD, BCPS Clinical Pharmacist Phone: [phone]

## 2023-10-31 NOTE — Progress Notes (Signed)
 IVT consult placed for additional PIV access. USGPIV placed yesterday which has since infiltrated. Patient receiving IV fluids, hep gtt and antibiotics.  sent to MD to consider alternative access given needs and to preserve vasculature. Dr. Raenelle instructed that it is ok to hold the heparin  drip for patient to receive IV abx thru remaining PIV. Patient has 90 minutes left of IV fluids remaining. Received confirmation from Dr. Raenelle that he's ok with discontinuing fluids early to avoid drug interactions in remaining PIV site. Primary RN has been included in [facility] conversation.   Dr. Raenelle explained that patient is deciding about whether she wants to pursue HD. Once decision has been made, team will consider alternative access. If she decides against HD, per Dr. Raenelle, IR will need to place PICC.   At this time, no further IVT needs noted. If needs change, please re-consult team.  Tenae Graziosi R Zebadiah Willert, RN

## 2023-10-31 NOTE — Plan of Care (Signed)
  Problem: Education: Goal: Ability to describe self-care measures that may prevent or decrease complications (Diabetes Survival Skills Education) will improve Outcome: Progressing Goal: Individualized Educational Video(s) Outcome: Progressing   Problem: Coping: Goal: Ability to adjust to condition or change in health will improve Outcome: Progressing   Problem: Fluid Volume: Goal: Ability to maintain a balanced intake and output will improve Outcome: Progressing   Problem: Health Behavior/Discharge Planning: Goal: Ability to manage health-related needs will improve Outcome: Progressing   Problem: Metabolic: Goal: Ability to maintain appropriate glucose levels will improve Outcome: Progressing   Problem: Skin Integrity: Goal: Risk for impaired skin integrity will decrease Outcome: Progressing   Problem: Tissue Perfusion: Goal: Adequacy of tissue perfusion will improve Outcome: Progressing   Problem: Education: Goal: Knowledge of General Education information will improve Description: Including pain rating scale, medication(s)/side effects and non-pharmacologic comfort measures Outcome: Progressing

## 2023-10-31 NOTE — Progress Notes (Addendum)
 Palliative Medicine Progress Note   Patient Name: [name]       Date: 10/31/2023 DOB: [birth year]  Age: 68 y.o. MRN#: [id number] Attending Physician: Raenelle Donalda HERO, MD Primary Care Physician: Campbell Reynolds, NP Admit Date: 10/27/2023   HPI/Patient Profile: 69 y.o. female  with past medical history of CKD stage V, A-fib on Eliquis , DM type 2, CAD, and HTN who presented to the ED on 10/27/2023 with abdominal pain and was found to have acute diverticulitis with contained perforation involving the descending colon.  She also has AKI on CKD stage V, and BUN/creatinine is trending up.   Palliative Medicine has been consulted for goals of care and medical decision making.  Subjective: Chart reviewed including vitals signs, labs, progress notes, and orders. Update received from RN. Plan for CT abdomen today. Creatinine continues to trend up, 9.58 today.  I met with patient and her son/[name] today at bedside. Patient is complaining of severe 8/10 abdominal pain and nausea that came out of nowhere.  She is given zofran  followed by dilaudid , which starts to provide relief after about 10 minutes and brings her pain level down to a 5/10.   I reviewed with son patient's hospital course and current clinical condition.  Discussed that she does not yet need HD, but probably will soon if kidney function continues to worsen.  Discussed that patient has been consistent and adamant that she will not start HD, even if this results in a terminal trajectory.   Son inquires whether patient could do dialysis as a bridge to transplant. I explained this would be very unlikely, as patient was previously evaluated by [facility] transplant team but was not able to pass the endurance test (walking 1000 ft in 6 minutes). With  further discussion, son ultimately states he will be supportive of patient's decision not to do dialysis.   Discussed that if renal function continues to worsen and decision is not to start dialysis, then the recommendation would be for hospice to ensure comfort at end-of-life . Patient states I know this is where things are headed.  I shared with the son that patient told me yesterday she would prefer a hospice facility for end-of-life care, as she does not want to die at home.  Emotional support provided.  Questions and concerns were addressed to the best  of my ability.   Objective:  Physical Exam Vitals reviewed.  Constitutional:      Appearance: She is ill-appearing.     Comments: Mild acute distress related to abdominal pain  Pulmonary:     Effort: Pulmonary effort is normal.  Neurological:     Mental Status: She is alert and oriented to person, place, and time.              Palliative Medicine Assessment & Plan   Assessment: Principal Problem:   Diverticulitis of large intestine with complication Active Problems:   Chronic kidney disease, stage 5 (HCC)    Recommendations/Plan: Continue current supportive interventions Patient is clear she does not want HD, even if this results in a terminal situation Patient and son understand she will need hospice care if renal failure continues to progress - patient would prefer inpatient hospice facility  Ongoing palliative support   Code Status: DNR/DNI   Prognosis:  Very guarded  Discharge Planning: To Be Determined  Care plan was discussed with Dr. Raenelle  Thank you for allowing the Palliative Medicine Team to assist in the care of this patient.   Time: 65 minutes   Recardo KATHEE Loll, NP   Please contact Palliative Medicine Team phone at [phone] for questions and concerns.  For individual providers, please see AMION.

## 2023-10-31 NOTE — Progress Notes (Signed)
 Patient ID: [name], female   DOB: [birth year], 68 y.o.   MRN: [id number] [facility] Progress Note   Assessment/ Plan:   1.  Acute kidney injury on chronic kidney disease stage V: Advanced proteinuric chronic kidney disease at baseline that appears to be from a combination of diabetes and hypertension.  Acute injury appears to be hemodynamically mediated in the setting of acute diverticulitis with contained perforation.  Unimpressive urine output with slight worsening of renal function with intravenous fluids overnight.  She does not have any uremic signs or symptoms prompting renal replacement therapy and today after a long discussion, she has informed me that she would not undertake long-term dialysis with the understanding that she may succumb to mortality from this. 2.  Acute diverticulitis with contained perforation of descending colon: On Zosyn  and ongoing pain management. 3.  Anion gap metabolic acidosis: Likely secondary to combination of acute kidney injury and ongoing infection, monitor with sodium bicarbonate . 4.  Anemia: Likely secondary to chronic illness, will check iron  studies to decide on additional management including IV iron /ESA.  Subjective:   Reports to be feeling no different than she did yesterday.  Abdominal pain is controlled.  She denies any nausea, vomiting or dysgeusia.   Objective:   BP 134/60 (BP Location: Left Arm)   Pulse 74   Temp 98.1 F (36.7 C) (Oral)   Resp 20   Ht 5' 7 (1.702 m)   Wt 97.1 kg   SpO2 98%   BMI 33.53 kg/m   Intake/Output Summary (Last 24 hours) at 10/31/2023 1126 Last data filed at 10/31/2023 0935 Gross per 24 hour  Intake 1911.95 ml  Output 350 ml  Net 1561.95 ml   Weight change:   Physical Exam: Gen: Comfortably resting in bed, awakens and engages in meaningful/appropriate conversation. CVS: Pulse regular rhythm, normal rate, S1 and S2 normal Resp: Clear to auscultation bilaterally with diminished breath  sounds over bases, no rales Abd: Soft, obese, tenderness over left lower quadrant Ext: Trace ankle edema.  No asterixis  Imaging: No results found.  Labs: BMET Recent Labs  Lab 10/27/23 1936 10/28/23 0522 10/29/23 0725 10/30/23 0501 10/31/23 0503  NA 134* 134* 138 135 134*  K 4.8 4.2 4.4 4.4 4.1  CL 100 102 103 102 102  CO2 18* 18* 19* 18* 18*  GLUCOSE 234* 267* 178* 209* 152*  BUN 77* 76* 95* 101* 100*  CREATININE 7.05* 7.33* 8.43* 9.26* 9.58*  CALCIUM  8.4* 8.0* 8.0* 7.9* 7.6*  PHOS  --  6.7* 7.1* 6.8* 6.1*   CBC Recent Labs  Lab 10/27/23 1936 10/28/23 0522 10/29/23 0725 10/30/23 0501 10/31/23 0503  WBC 16.8* 21.4* 17.9* 17.3* 13.7*  NEUTROABS 15.2*  --   --   --   --   HGB 9.8* 8.7* 8.3* 7.9* 7.4*  HCT 31.5* 27.9* 26.4* 24.6* 22.7*  MCV 82.9 83.3 82.2 81.2 79.9*  PLT 435* 355 357 356 337    Medications:     amLODipine   5 mg Oral Daily   carvedilol   12.5 mg Oral BID WC   insulin  aspart  0-6 Units Subcutaneous TID WC   insulin  glargine-yfgn  8 Units Subcutaneous Daily   latanoprost   1 drop Both Eyes QHS   sodium bicarbonate   650 mg Oral BID   sodium chloride  flush  3 mL Intravenous Q12H    Gordy Blanch, MD 10/31/2023, 11:26 AM

## 2023-10-31 NOTE — Progress Notes (Signed)
 Progress Note     Subjective: Pt still adamant that she would not want HD, she does understand what that means in regards to overall care and needing hospice. She reports less abdominal pain. Some nausea when she does take any pain medication but only taking in the morning and before bed. No flatus or BM. She understands that she is not a candidate for PD at this point in time in the setting of intraabdominal infection. She would like to have a repeat CT to evaluate diverticulitis but understands that in setting of ARF and no HD that it may not change anything we are doing.   Objective: Vital signs in last 24 hours: Temp:  [97.8 F (36.6 C)-98.5 F (36.9 C)] 98.1 F (36.7 C) (02/06 0748) Pulse Rate:  [68-85] 74 (02/06 0748) Resp:  [12-20] 20 (02/06 0748) BP: (129-158)/(58-79) 158/79 (02/06 0748) SpO2:  [96 %-98 %] 98 % (02/06 0748) Weight:  [97.1 kg] 97.1 kg (02/06 0500) Last BM Date : 10/25/23  Intake/Output from previous day: 02/05 0701 - 02/06 0700 In: 318.8 [I.V.:235; IV Piggyback:83.8] Out: 350 [Urine:350] Intake/Output this shift: Total I/O In: 1593.1 [I.V.:1593.1] Out: -   PE: General: pleasant, WD, WN female who is laying in bed in NAD Heart: regular, rate, and rhythm.   Lungs: Respiratory effort nonlabored Abd: soft, mildly TTP in LLQ and suprapubic abdomen without peritonitis, mild distention Psych: A&Ox3 with an appropriate affect.    Lab Results:  Recent Labs    10/30/23 0501 10/31/23 0503  WBC 17.3* 13.7*  HGB 7.9* 7.4*  HCT 24.6* 22.7*  PLT 356 337   BMET Recent Labs    10/30/23 0501 10/31/23 0503  NA 135 134*  K 4.4 4.1  CL 102 102  CO2 18* 18*  GLUCOSE 209* 152*  BUN 101* 100*  CREATININE 9.26* 9.58*  CALCIUM  7.9* 7.6*   PT/INR No results for input(s): LABPROT, INR in the last 72 hours.  CMP     Component Value Date/Time   NA 134 (L) 10/31/2023 0503   NA 137 02/28/2021 1548   K 4.1 10/31/2023 0503   CL 102 10/31/2023 0503    CO2 18 (L) 10/31/2023 0503   GLUCOSE 152 (H) 10/31/2023 0503   BUN 100 (H) 10/31/2023 0503   BUN 33 (H) 02/28/2021 1548   CREATININE 9.58 (H) 10/31/2023 0503   CREATININE 3.38 (H) 12/26/2015 1347   CALCIUM  7.6 (L) 10/31/2023 0503   PROT 7.3 10/27/2023 1936   PROT 7.0 11/07/2017 1414   ALBUMIN  2.4 (L) 10/31/2023 0503   ALBUMIN  4.0 11/07/2017 1414   AST 17 10/27/2023 1936   ALT 9 10/27/2023 1936   ALKPHOS 44 10/27/2023 1936   BILITOT 1.0 10/27/2023 1936   BILITOT 0.2 11/07/2017 1414   GFRNONAA 4 (L) 10/31/2023 0503   GFRNONAA 13 (L) 01/10/2015 1428   GFRAA 21 (L) 12/21/2019 1802   GFRAA 16 (L) 01/10/2015 1428   Lipase     Component Value Date/Time   LIPASE 26 10/27/2023 1936       Studies/Results: No results found.   Anti-infectives: Anti-infectives (From admission, onward)    Start     Dose/Rate Route Frequency Ordered Stop   10/29/23 1100  piperacillin -tazobactam (ZOSYN ) IVPB 2.25 g        2.25 g 100 mL/hr over 30 Minutes Intravenous Every 8 hours 10/29/23 1006     10/28/23 0600  piperacillin -tazobactam (ZOSYN ) IVPB 2.25 g  Status:  Discontinued  2.25 g 100 mL/hr over 30 Minutes Intravenous Every 8 hours 10/27/23 2224 10/27/23 2235   10/28/23 0600  ceFEPIme  (MAXIPIME ) 1 g in sodium chloride  0.9 % 100 mL IVPB  Status:  Discontinued        1 g 200 mL/hr over 30 Minutes Intravenous Every 24 hours 10/27/23 2235 10/29/23 0954   10/28/23 0600  metroNIDAZOLE  (FLAGYL ) IVPB 500 mg  Status:  Discontinued        500 mg 100 mL/hr over 60 Minutes Intravenous Every 12 hours 10/27/23 2235 10/29/23 0954   10/27/23 2130  piperacillin -tazobactam (ZOSYN ) IVPB 3.375 g        3.375 g 100 mL/hr over 30 Minutes Intravenous  Once 10/27/23 2128 10/27/23 2320        Assessment/Plan  Sigmoid diverticulitis with contained perforation  - CT 2/2 with above, no abscess - hx of right hemicolectomy for polyps that were too large to be removed during colonoscopy  - WBC 13K,  stable - continue CLD for now since not having much bowel function  - continue IV abx  - no indication for emergent surgical intervention at this time but we will continue to follow - will get repeat CT today per patient request - will discuss PO contrast with MD, obviously no IV contrast   FEN: CLD VTE: heparin  gtt ID: cefepime /flagyl  2/3>2/4; Zosyn  2/4>>  - per [facility] -  PAF  HTN HLD AKI on CKD stage V - Cr 9.5 today, pt clear with me that she would not want to undergo HD, not a candidate for PD. Considering hospice CAD T2DM  LOS: 4 days   I reviewed Consultant nephrology, palliative notes, hospitalist notes, last 24 h vitals and pain scores, last 48 h intake and output, and last 24 h labs and trends.  This care required moderate level of medical decision making.    [name], [facility] 10/31/2023, 9:45 AM Please see Amion for pager number during day hours 7:00am-4:30pm

## 2023-10-31 NOTE — Progress Notes (Signed)
 PHARMACY - ANTICOAGULATION CONSULT NOTE  Pharmacy Consult:  Heparin  Indication: atrial fibrillation  Allergies  Allergen Reactions   Nsaids Other (See Comments)    CKD stage 4   Lisinopril Other (See Comments)    coughing   Peanut-Containing Drug Products Itching and Other (See Comments)    GI intolerance - diarrhea    Patient Measurements: Height: 5' 7 (170.2 cm) Weight: 97.1 kg (214 lb 1.6 oz) IBW/kg (Calculated) : 61.6 Heparin  Dosing Weight: 83 kg  Vital Signs: Temp: 98 F (36.7 C) (02/06 1800) Temp Source: Oral (02/06 1800) BP: 140/66 (02/06 1800) Pulse Rate: 71 (02/06 1552)  Labs: Recent Labs    10/29/23 0725 10/29/23 1806 10/30/23 0501 10/30/23 1427 10/30/23 1836 10/31/23 0503 10/31/23 1759  HGB 8.3*  --  7.9*  --   --  7.4*  --   HCT 26.4*  --  24.6*  --   --  22.7*  --   PLT 357  --  356  --   --  337  --   APTT  --    < > 39* >200* 54* 76*  --   HEPARINUNFRC  --    < > 0.48  --   --  0.58 0.69  CREATININE 8.43*  --  9.26*  --   --  9.58*  --    < > = values in this interval not displayed.    Estimated Creatinine Clearance: 6.7 mL/min (A) (by C-G formula based on SCr of 9.58 mg/dL (H)).  Medications:  -PTA eliquis  2.5mg  PO BID (last dose 10/27/2023 AM)  Assessment: 13 yoF presents with abdominal pain with complicated diverticulitis w/ contained perforation and possible need for colectomy. PMH: CKD 5, A fib, CAD, and CVA.  Pharmacy consulted to dose IV heparin  for Afib.  2/6 PM update: Heparin  level 0.69 (therapeutic) on heparin  1950 units/hr. No issues with the infusion or bleeding reported.  Goal of Therapy:  Heparin  level 0.3-0.7 units/ml Monitor platelets by anticoagulation protocol: Yes   Plan:  Cont heparin  1950 units/hr Check daily heparin  level and CBC  Rocky Slade, PharmD, BCPS Clinical Pharmacist Phone: [phone]  Please check AMION for all [facility] Pharmacy phone numbers After 10:00 PM, call Main Pharmacy [phone]

## 2023-11-01 ENCOUNTER — Inpatient Hospital Stay (HOSPITAL_COMMUNITY): Payer: Medicare PPO

## 2023-11-01 DIAGNOSIS — Z7189 Other specified counseling: Secondary | ICD-10-CM

## 2023-11-01 DIAGNOSIS — Z515 Encounter for palliative care: Secondary | ICD-10-CM | POA: Diagnosis not present

## 2023-11-01 DIAGNOSIS — K5732 Diverticulitis of large intestine without perforation or abscess without bleeding: Secondary | ICD-10-CM | POA: Diagnosis not present

## 2023-11-01 DIAGNOSIS — N179 Acute kidney failure, unspecified: Secondary | ICD-10-CM | POA: Diagnosis not present

## 2023-11-01 DIAGNOSIS — K5780 Diverticulitis of intestine, part unspecified, with perforation and abscess without bleeding: Secondary | ICD-10-CM

## 2023-11-01 DIAGNOSIS — N185 Chronic kidney disease, stage 5: Secondary | ICD-10-CM

## 2023-11-01 HISTORY — PX: IR FLUORO GUIDE CV LINE RIGHT: IMG2283

## 2023-11-01 HISTORY — PX: IR US GUIDE VASC ACCESS RIGHT: IMG2390

## 2023-11-01 LAB — CBC
HCT: 27.6 % — ABNORMAL LOW (ref 36.0–46.0)
Hemoglobin: 9 g/dL — ABNORMAL LOW (ref 12.0–15.0)
MCH: 26 pg (ref 26.0–34.0)
MCHC: 32.6 g/dL (ref 30.0–36.0)
MCV: 79.8 fL — ABNORMAL LOW (ref 80.0–100.0)
Platelets: 384 10*3/uL (ref 150–400)
RBC: 3.46 MIL/uL — ABNORMAL LOW (ref 3.87–5.11)
RDW: 14.7 % (ref 11.5–15.5)
WBC: 12 10*3/uL — ABNORMAL HIGH (ref 4.0–10.5)
nRBC: 0 % (ref 0.0–0.2)

## 2023-11-01 LAB — RENAL FUNCTION PANEL
Albumin: 2.2 g/dL — ABNORMAL LOW (ref 3.5–5.0)
Anion gap: 18 — ABNORMAL HIGH (ref 5–15)
BUN: 106 mg/dL — ABNORMAL HIGH (ref 8–23)
CO2: 14 mmol/L — ABNORMAL LOW (ref 22–32)
Calcium: 7.6 mg/dL — ABNORMAL LOW (ref 8.9–10.3)
Chloride: 97 mmol/L — ABNORMAL LOW (ref 98–111)
Creatinine, Ser: 10.48 mg/dL — ABNORMAL HIGH (ref 0.44–1.00)
GFR, Estimated: 4 mL/min — ABNORMAL LOW (ref >60)
Glucose, Bld: 236 mg/dL — ABNORMAL HIGH (ref 70–99)
Phosphorus: 7.6 mg/dL — ABNORMAL HIGH (ref 2.5–4.6)
Potassium: 4.6 mmol/L (ref 3.5–5.1)
Sodium: 129 mmol/L — ABNORMAL LOW (ref 135–145)

## 2023-11-01 LAB — GLUCOSE, CAPILLARY
Glucose-Capillary: 237 mg/dL — ABNORMAL HIGH (ref 70–99)
Glucose-Capillary: 230 mg/dL — ABNORMAL HIGH (ref 70–99)
Glucose-Capillary: 205 mg/dL — ABNORMAL HIGH (ref 70–99)

## 2023-11-01 LAB — HEPARIN LEVEL (UNFRACTIONATED): Heparin Unfractionated: 0.8 [IU]/mL — ABNORMAL HIGH (ref 0.30–0.70)

## 2023-11-01 LAB — HEPATITIS B SURFACE ANTIGEN

## 2023-11-01 MED ORDER — SODIUM CHLORIDE 0.9 % IV SOLN: 250.0000 mL | INTRAVENOUS | Status: AC | PRN

## 2023-11-01 MED ORDER — SODIUM CHLORIDE 0.9% FLUSH: 3.0000 mL | INTRAVENOUS | Status: DC | PRN

## 2023-11-01 MED ORDER — ONDANSETRON HCL 4 MG/2ML IJ SOLN: 4.0000 mg | Freq: Four times a day (QID) | INTRAMUSCULAR | Status: DC | PRN

## 2023-11-01 MED ORDER — CARVEDILOL 6.25 MG PO TABS
6.2500 mg | ORAL_TABLET | Freq: Two times a day (BID) | ORAL | Status: DC
Start: 2023-11-01 — End: 2023-11-03
  Administered 2023-11-01 – 2023-11-02 (×2): 6.25 mg via ORAL
  Filled 2023-11-01 (×2): qty 1

## 2023-11-01 MED ORDER — LIDOCAINE-EPINEPHRINE 1 %-1:100000 IJ SOLN
INTRAMUSCULAR | Status: AC
  Filled 2023-11-01: qty 1

## 2023-11-01 MED ORDER — SODIUM CHLORIDE 0.9 % IV SOLN: 250.0000 mL | INTRAVENOUS | Status: DC | PRN

## 2023-11-01 MED ORDER — HYDROMORPHONE BOLUS VIA INFUSION: 1.0000 mg | INTRAVENOUS | Status: DC | PRN

## 2023-11-01 MED ORDER — ONDANSETRON HCL 4 MG/2ML IJ SOLN
4.0000 mg | Freq: Four times a day (QID) | INTRAMUSCULAR | Status: DC | PRN
  Administered 2023-11-01 – 2023-11-24 (×5): 4 mg via INTRAVENOUS
  Filled 2023-11-01 (×4): qty 2

## 2023-11-01 MED ORDER — HYDROMORPHONE HCL 1 MG/ML IJ SOLN
0.5000 mg | INTRAMUSCULAR | Status: DC | PRN
  Administered 2023-11-01 – 2023-11-08 (×18): 1 mg via INTRAVENOUS
  Filled 2023-11-01 (×19): qty 1

## 2023-11-01 MED ORDER — ACETAMINOPHEN 325 MG PO TABS
650.0000 mg | ORAL_TABLET | Freq: Four times a day (QID) | ORAL | Status: DC | PRN
  Administered 2023-11-01 – 2023-11-25 (×9): 650 mg via ORAL
  Filled 2023-11-01 (×9): qty 2

## 2023-11-01 MED ORDER — HALOPERIDOL LACTATE 5 MG/ML IJ SOLN: 0.5000 mg | INTRAMUSCULAR | Status: DC | PRN

## 2023-11-01 MED ORDER — ACETAMINOPHEN 650 MG RE SUPP: 650.0000 mg | Freq: Four times a day (QID) | RECTAL | Status: DC | PRN

## 2023-11-01 MED ORDER — GLYCOPYRROLATE 0.2 MG/ML IJ SOLN: 0.2000 mg | INTRAMUSCULAR | Status: DC | PRN

## 2023-11-01 MED ORDER — HEPARIN (PORCINE) 25000 UT/250ML-% IV SOLN
1800.0000 [IU]/h | INTRAVENOUS | Status: AC
  Filled 2023-11-01: qty 250

## 2023-11-01 MED ORDER — HALOPERIDOL 0.5 MG PO TABS: 0.5000 mg | ORAL_TABLET | ORAL | Status: DC | PRN

## 2023-11-01 MED ORDER — PIPERACILLIN-TAZOBACTAM IN DEX 2-0.25 GM/50ML IV SOLN
2.2500 g | Freq: Three times a day (TID) | INTRAVENOUS | Status: AC
  Administered 2023-11-01 – 2023-11-06 (×15): 2.25 g via INTRAVENOUS
  Filled 2023-11-01 (×16): qty 50

## 2023-11-01 MED ORDER — AMLODIPINE BESYLATE 5 MG PO TABS
5.0000 mg | ORAL_TABLET | Freq: Every day | ORAL | Status: DC
Start: 2023-11-01 — End: 2023-11-03
  Administered 2023-11-01 – 2023-11-02 (×2): 5 mg via ORAL
  Filled 2023-11-01 (×2): qty 1

## 2023-11-01 MED ORDER — LORAZEPAM 1 MG PO TABS: 1.0000 mg | ORAL_TABLET | ORAL | Status: DC | PRN

## 2023-11-01 MED ORDER — SODIUM CHLORIDE 0.9% FLUSH
10.0000 mL | Freq: Two times a day (BID) | INTRAVENOUS | Status: DC
  Administered 2023-11-01 – 2023-11-25 (×40): 10 mL

## 2023-11-01 MED ORDER — LORAZEPAM 2 MG/ML PO CONC
1.0000 mg | ORAL | Status: DC | PRN
  Filled 2023-11-01: qty 0.5

## 2023-11-01 MED ORDER — LIDOCAINE-EPINEPHRINE (PF) 1 %-1:200000 IJ SOLN
10.0000 mL | Freq: Once | INTRAMUSCULAR | Status: AC
  Administered 2023-11-08: 10 mL
  Filled 2023-11-01: qty 30

## 2023-11-01 MED ORDER — SODIUM CHLORIDE 0.9 % IV SOLN
1.0000 mg/h | INTRAVENOUS | Status: DC
  Filled 2023-11-01: qty 5

## 2023-11-01 MED ORDER — HALOPERIDOL LACTATE 2 MG/ML PO CONC
0.5000 mg | ORAL | Status: DC | PRN
  Filled 2023-11-01: qty 5

## 2023-11-01 MED ORDER — SODIUM BICARBONATE 650 MG PO TABS
650.0000 mg | ORAL_TABLET | Freq: Three times a day (TID) | ORAL | Status: DC
  Administered 2023-11-01 – 2023-11-02 (×4): 650 mg via ORAL
  Filled 2023-11-01 (×5): qty 1

## 2023-11-01 MED ORDER — SODIUM CHLORIDE 0.9% FLUSH
3.0000 mL | Freq: Two times a day (BID) | INTRAVENOUS | Status: DC
  Administered 2023-11-01 – 2023-11-02 (×3): 3 mL via INTRAVENOUS

## 2023-11-01 MED ORDER — SODIUM CHLORIDE 0.9% FLUSH: 10.0000 mL | INTRAVENOUS | Status: DC | PRN

## 2023-11-01 MED ORDER — INSULIN ASPART 100 UNIT/ML IJ SOLN
0.0000 [IU] | Freq: Three times a day (TID) | INTRAMUSCULAR | Status: DC
  Administered 2023-11-01 – 2023-11-03 (×5): 2 [IU] via SUBCUTANEOUS
  Administered 2023-11-03: 1 [IU] via SUBCUTANEOUS
  Administered 2023-11-04 (×2): 2 [IU] via SUBCUTANEOUS
  Administered 2023-11-04: 1 [IU] via SUBCUTANEOUS
  Administered 2023-11-05 – 2023-11-06 (×4): 2 [IU] via SUBCUTANEOUS
  Administered 2023-11-06: 1 [IU] via SUBCUTANEOUS
  Administered 2023-11-06 – 2023-11-07 (×2): 2 [IU] via SUBCUTANEOUS

## 2023-11-01 MED ORDER — CHLORHEXIDINE GLUCONATE CLOTH 2 % EX PADS
6.0000 | MEDICATED_PAD | Freq: Every day | CUTANEOUS | Status: DC
  Administered 2023-11-02 – 2023-11-25 (×24): 6 via TOPICAL

## 2023-11-01 MED ORDER — ONDANSETRON 4 MG PO TBDP: 4.0000 mg | ORAL_TABLET | Freq: Four times a day (QID) | ORAL | Status: DC | PRN

## 2023-11-01 MED ORDER — LORAZEPAM 2 MG/ML IJ SOLN: 1.0000 mg | INTRAMUSCULAR | Status: DC | PRN

## 2023-11-01 MED ORDER — HEPARIN SODIUM (PORCINE) 1000 UNIT/ML IJ SOLN
INTRAMUSCULAR | Status: AC
Start: 2023-11-01 — End: ?
  Filled 2023-11-01: qty 10

## 2023-11-01 MED ORDER — POLYETHYLENE GLYCOL 3350 17 G PO PACK: 17.0000 g | PACK | Freq: Every day | ORAL | Status: DC | PRN

## 2023-11-01 MED ORDER — POLYVINYL ALCOHOL 1.4 % OP SOLN: 1.0000 [drp] | Freq: Four times a day (QID) | OPHTHALMIC | Status: DC | PRN

## 2023-11-01 MED ORDER — SODIUM CHLORIDE 0.9% FLUSH: 3.0000 mL | Freq: Two times a day (BID) | INTRAVENOUS | Status: DC

## 2023-11-01 MED ORDER — GLYCOPYRROLATE 1 MG PO TABS: 1.0000 mg | ORAL_TABLET | ORAL | Status: DC | PRN

## 2023-11-01 MED ORDER — ALBUTEROL SULFATE (2.5 MG/3ML) 0.083% IN NEBU: 2.5000 mg | INHALATION_SOLUTION | RESPIRATORY_TRACT | Status: DC | PRN

## 2023-11-01 MED ORDER — HEPARIN SODIUM (PORCINE) 1000 UNIT/ML IJ SOLN
2800.0000 [IU] | Freq: Once | INTRAMUSCULAR | Status: AC
  Administered 2023-11-01: 2800 [IU]

## 2023-11-01 MED ORDER — ONDANSETRON HCL 4 MG/2ML IJ SOLN
INTRAMUSCULAR | Status: AC
  Filled 2023-11-01: qty 2

## 2023-11-01 NOTE — Procedures (Signed)
 Vascular and Interventional Radiology Procedure Note  Patient: [name] DOB: 08-11-1956 Medical Record Number: [id number] Note Date/Time: 11/01/23 4:28 PM   Performing Physician: Thom Hall, MD Assistant(s): None  Diagnosis: ESRD requiring Hemodialysis  Procedure: NON-TUNNELED HEMODIALYSIS CATHETER PLACEMENT  Anesthesia: Local Anesthetic Complications: None Estimated Blood Loss: Minimal Specimens:  None  Findings:  Successful placement of a right-sided, 19.5 cm NON-tunneled hemodialysis catheter with the tip of the catheter in the proximal right atrium.  Plan: Catheter ready for use. This catheter may be converted to a tunneled dialysis catheter at a later date as indicated.  See detailed procedure note with images in PACS. The patient tolerated the procedure well without incident or complication and was returned to Recovery in stable condition.    Thom Hall, MD Vascular and Interventional Radiology Specialists [facility] Radiology   Pager. [phone] Clinic. [phone]

## 2023-11-01 NOTE — Progress Notes (Signed)
 Pharmacy Antibiotic Note  [name] is a 68 y.o. female admitted on 10/27/2023 with sigmoid diverticulitis with contained perforation.  Pharmacy has been consulted for change to Zosyn  dosing.  AKI on CKD5.   She has been on D5 of abx. Plan was for comfort care but that has been reversed. Zosyn  has been reordered for intra-abd infection before surgery.   Scr >10  Plan: Zosyn  2.25 gm IV q8h   Height: 5' 7 (170.2 cm) Weight: 101.4 kg (223 lb 8.7 oz) IBW/kg (Calculated) : 61.6  Temp (24hrs), Avg:98.1 F (36.7 C), Min:97.8 F (36.6 C), Max:98.4 F (36.9 C)  Recent Labs  Lab 10/27/23 2240 10/28/23 0522 10/29/23 0725 10/30/23 0501 10/31/23 0503 11/01/23 0532  WBC  --  21.4* 17.9* 17.3* 13.7* 12.0*  CREATININE  --  7.33* 8.43* 9.26* 9.58* 10.48*  LATICACIDVEN 1.0  --   --   --   --   --     Estimated Creatinine Clearance: 6.3 mL/min (A) (by C-G formula based on SCr of 10.48 mg/dL (H)).    Allergies  Allergen Reactions   Nsaids Other (See Comments)    CKD stage 4   Lisinopril Other (See Comments)    coughing   Peanut-Containing Drug Products Itching and Other (See Comments)    GI intolerance - diarrhea    Antimicrobials this admission: Zosyn  x 1 on 2/2; resuming 2/4>> Cefepime  2/3>>2/4 Flagyl  IV 2/3>>2/4  Dose adjustments this admission:   Microbiology results: 2/2: COVID, flu and RSV: negative  Sergio Batch, PharmD, BCIDP, AAHIVP, CPP Infectious Disease Pharmacist 11/01/2023 1:23 PM

## 2023-11-01 NOTE — Progress Notes (Signed)
 PROGRESS NOTE        PATIENT DETAILS Name: [name] Age: 68 y.o. Sex: female Date of Birth: 1956/08/12 Admit Date: 10/27/2023 Admitting Physician Dorn Dawson, MD ERE:Duntz, Damian, NP  Brief Summary: Patient is a 68 y.o.  female with history of CKD 5, A-fib on Eliquis , CAD-who presented with abdominal pain-found to have acute diverticulitis with contained perforation involving the descending colon.  Significant events: 2/2>> admit to [facility] 2/6>> CT abdomen worsening diverticulitis with perforation 2/7>> more lethargic-nauseous-further worsening of her renal function General Surgery has signed off-transitioning to full hospice care.   Significant studies: 2/2>> CT abdomen/pelvis: Acute diverticulitis-descending colon with contained perforation 2/6>> CT abdomen/pelvis: Worsening diverticulitis of descending colon with perforation and increasing failure-suspected oral contrast extravasation left abdomen.  Significant microbiology data: 2/2>> COVID/influenza/RSV PCR: Negative  Procedures: None  Consults: Surgery Renal Palliative  Subjective: More lethargic-more more abdominal pain-more nauseous-with dry heaving-vomited last night  Objective: Vitals: Blood pressure (!) 143/56, pulse 76, temperature 98.1 F (36.7 C), temperature source Oral, resp. rate 16, height 5' 7 (1.702 m), weight 101.4 kg, SpO2 94%.   Exam: Gen Exam:Alert awake-but much more lethargic HEENT:atraumatic, normocephalic Chest: B/L clear to auscultation anteriorly CVS:S1S2 regular Abdomen: Soft-more tenderness in the lower abdomen Extremities:no edema Neurology: Non focal Skin: no rash  Pertinent Labs/Radiology:    Latest Ref Rng & Units 11/01/2023    5:32 AM 10/31/2023    5:03 AM 10/30/2023    5:01 AM  CBC  WBC 4.0 - 10.5 K/uL 12.0  13.7  17.3   Hemoglobin 12.0 - 15.0 g/dL 9.0  7.4  7.9   Hematocrit 36.0 - 46.0 % 27.6  22.7  24.6   Platelets 150 - 400 K/uL 384   337  356     Lab Results  Component Value Date   NA 129 (L) 11/01/2023   K 4.6 11/01/2023   CL 97 (L) 11/01/2023   CO2 14 (L) 11/01/2023      Assessment/Plan: Acute diverticulitis with contained perforation of the descending colon Unfortunately-more abdominal pain this morning-CT abdomen done last evening shows a worsening of diverticulitis changes as well Renal has worsened significantly-she does not want to pursue HD General Surgery has signed off 2/7. Plan at this time is to transition to full comfort measures-liberalizing diet-stopping antibiotics.  AKI on CKD 5 with progression to ESRD AKI hemodynamically mediated Improved patient's prior wishes-she did not wish to start hemodialysis treatment-her brother is on hemodialysis-and she would not want the quality of life extensive discussion done during this hospitalization by this MD, renal MD and palliative care team-patient today the very firm that she does not want to start HD-even in a life-threatening situation.  Unfortunately-overall clinical situation has worsened significantly this morning-suspect some of the vomiting/nausea that she is having is likely due to uremia.  She reaffirms not wanting to start HD-wants to instead focus on hospice/comfort care.  Will ask social worker to begin referral to inpatient hospice.  CAD Currently no anginal symptoms No role to continue aspirin /beta-blocker/anticoagulation-full comfort care measures as of 2/7  PAF Sinus rhythm No role for beta-blocker/anticoagulation-comfort measures started on 2/7.  HTN BP stable Stop amlodipine /Coreg -comfort measures started on 2/7.  DM-2 (A1c 6.6 on 2/3) CBGs able-no further monitoring of CBGs-stopping insulin -comfort care started on 2/7.  Recent Labs    10/31/23 1552 10/31/23 2128 11/01/23 0805  GLUCAP 168* 173* 237*     Class 1 Obesity: Estimated body mass index is 35.01 kg/m as calculated from the following:   Height as of this  encounter: 5' 7 (1.702 m).   Weight as of this encounter: 101.4 kg.   Palliative care/goals of care DNR in place Please see prior notes-patient has been very firm and not wishing to pursue hemodialysis-even in a life-threatening situation-her clinical situation/renal function is much worse this morning-plan is to transition to full comfort measures.  Have asked social work to see if we can get her to residential hospice.  I have updated the patient's son [name] on 2/7.  Given rapidly worsening renal failure/uremia/worsening diverticulitis-life expectancy is expected to be less than 2 weeks.    Code status:   Code Status: Limited: Do not attempt resuscitation (DNR) -DNR-LIMITED -Do Not Intubate/DNI    DVT Prophylaxis: SCDs Start: 10/27/23 2221 IV Heparin   Family Communication: [id number] updated 2/7   Disposition Plan: Status is: Inpatient Remains inpatient appropriate because: Severity of illness   Planned Discharge Destination:Residential hospice   Diet: Diet Order             Diet clear liquid Room service appropriate? Yes; Fluid consistency: Thin  Diet effective now                     Antimicrobial agents: Anti-infectives (From admission, onward)    Start     Dose/Rate Route Frequency Ordered Stop   10/29/23 1100  piperacillin -tazobactam (ZOSYN ) IVPB 2.25 g        2.25 g 100 mL/hr over 30 Minutes Intravenous Every 8 hours 10/29/23 1006     10/28/23 0600  piperacillin -tazobactam (ZOSYN ) IVPB 2.25 g  Status:  Discontinued        2.25 g 100 mL/hr over 30 Minutes Intravenous Every 8 hours 10/27/23 2224 10/27/23 2235   10/28/23 0600  ceFEPIme  (MAXIPIME ) 1 g in sodium chloride  0.9 % 100 mL IVPB  Status:  Discontinued        1 g 200 mL/hr over 30 Minutes Intravenous Every 24 hours 10/27/23 2235 10/29/23 0954   10/28/23 0600  metroNIDAZOLE  (FLAGYL ) IVPB 500 mg  Status:  Discontinued        500 mg 100 mL/hr over 60 Minutes Intravenous Every 12 hours  10/27/23 2235 10/29/23 0954   10/27/23 2130  piperacillin -tazobactam (ZOSYN ) IVPB 3.375 g        3.375 g 100 mL/hr over 30 Minutes Intravenous  Once 10/27/23 2128 10/27/23 2320        MEDICATIONS: Scheduled Meds:  amLODipine   5 mg Oral Daily   carvedilol   12.5 mg Oral BID WC   insulin  aspart  0-6 Units Subcutaneous TID WC   insulin  glargine-yfgn  8 Units Subcutaneous Daily   latanoprost   1 drop Both Eyes QHS   sodium bicarbonate   650 mg Oral BID   sodium chloride  flush  3 mL Intravenous Q12H   Continuous Infusions:  heparin  1,800 Units/hr (11/01/23 0800)   piperacillin -tazobactam (ZOSYN )  IV Stopped (11/01/23 0332)   PRN Meds:.acetaminophen , HYDROmorphone  (DILAUDID ) injection **OR** HYDROmorphone  (DILAUDID ) injection, ipratropium-albuterol , ondansetron  (ZOFRAN ) IV   I have personally reviewed following labs and imaging studies  LABORATORY DATA: CBC: Recent Labs  Lab 10/27/23 1936 10/28/23 0522 10/29/23 0725 10/30/23 0501 10/31/23 0503 11/01/23 0532  WBC 16.8* 21.4* 17.9* 17.3* 13.7* 12.0*  NEUTROABS 15.2*  --   --   --   --   --   HGB 9.8*  8.7* 8.3* 7.9* 7.4* 9.0*  HCT 31.5* 27.9* 26.4* 24.6* 22.7* 27.6*  MCV 82.9 83.3 82.2 81.2 79.9* 79.8*  PLT 435* 355 357 356 337 384    Basic Metabolic Panel: Recent Labs  Lab 10/28/23 0522 10/29/23 0725 10/30/23 0501 10/31/23 0503 11/01/23 0532  NA 134* 138 135 134* 129*  K 4.2 4.4 4.4 4.1 4.6  CL 102 103 102 102 97*  CO2 18* 19* 18* 18* 14*  GLUCOSE 267* 178* 209* 152* 236*  BUN 76* 95* 101* 100* 106*  CREATININE 7.33* 8.43* 9.26* 9.58* 10.48*  CALCIUM  8.0* 8.0* 7.9* 7.6* 7.6*  MG 2.2  --   --   --   --   PHOS 6.7* 7.1* 6.8* 6.1* 7.6*    GFR: Estimated Creatinine Clearance: 6.3 mL/min (A) (by C-G formula based on SCr of 10.48 mg/dL (H)).  Liver Function Tests: Recent Labs  Lab 10/27/23 1936 10/29/23 0725 10/30/23 0501 10/31/23 0503 11/01/23 0532  AST 17  --   --   --   --   ALT 9  --   --   --   --    ALKPHOS 44  --   --   --   --   BILITOT 1.0  --   --   --   --   PROT 7.3  --   --   --   --   ALBUMIN  3.4* 2.8* 2.6* 2.4* 2.2*   Recent Labs  Lab 10/27/23 1936  LIPASE 26   No results for input(s): AMMONIA in the last 168 hours.  Coagulation Profile: Recent Labs  Lab 10/28/23 0522  INR >10.0*    Cardiac Enzymes: No results for input(s): CKTOTAL, CKMB, CKMBINDEX, TROPONINI in the last 168 hours.  BNP (last 3 results) No results for input(s): PROBNP in the last 8760 hours.  Lipid Profile: No results for input(s): CHOL, HDL, LDLCALC, TRIG, CHOLHDL, LDLDIRECT in the last 72 hours.  Thyroid  Function Tests: No results for input(s): TSH, T4TOTAL, FREET4, T3FREE, THYROIDAB in the last 72 hours.  Anemia Panel: Recent Labs    10/30/23 1427  FERRITIN 140  TIBC 181*  IRON  12*    Urine analysis:    Component Value Date/Time   COLORURINE YELLOW 10/28/2023 0416   APPEARANCEUR HAZY (A) 10/28/2023 0416   LABSPEC 1.010 10/28/2023 0416   PHURINE 7.0 10/28/2023 0416   GLUCOSEU 150 (A) 10/28/2023 0416   GLUCOSEU NEGATIVE 05/05/2021 1228   HGBUR NEGATIVE 10/28/2023 0416   BILIRUBINUR NEGATIVE 10/28/2023 0416   KETONESUR NEGATIVE 10/28/2023 0416   PROTEINUR >=300 (A) 10/28/2023 0416   UROBILINOGEN 0.2 05/05/2021 1228   NITRITE NEGATIVE 10/28/2023 0416   LEUKOCYTESUR MODERATE (A) 10/28/2023 0416    Sepsis Labs: Lactic Acid, Venous    Component Value Date/Time   LATICACIDVEN 1.0 10/27/2023 2240    MICROBIOLOGY: Recent Results (from the past 240 hours)  Resp panel by RT-PCR (RSV, Flu A&B, Covid) Anterior Nasal Swab     Status: None   Collection Time: 10/27/23  7:18 PM   Specimen: Anterior Nasal Swab  Result Value Ref Range Status   SARS Coronavirus 2 by RT PCR NEGATIVE NEGATIVE Final   Influenza A by PCR NEGATIVE NEGATIVE Final   Influenza B by PCR NEGATIVE NEGATIVE Final    Comment: (NOTE) The Xpert Xpress SARS-CoV-2/FLU/RSV plus  assay is intended as an aid in the diagnosis of influenza from Nasopharyngeal swab specimens and should not be used as a sole basis for treatment. Nasal  washings and aspirates are unacceptable for Xpert Xpress SARS-CoV-2/FLU/RSV testing.  Fact Sheet for Patients: bloggercourse.com  Fact Sheet for Healthcare Providers: seriousbroker.it  This test is not yet approved or cleared by the [place]  FDA and has been authorized for detection and/or diagnosis of SARS-CoV-2 by FDA under an Emergency Use Authorization (EUA). This EUA will remain in effect (meaning this test can be used) for the duration of the COVID-19 declaration under Section 564(b)(1) of the Act, 21 U.S.C. section 360bbb-3(b)(1), unless the authorization is terminated or revoked.     Resp Syncytial Virus by PCR NEGATIVE NEGATIVE Final    Comment: (NOTE) Fact Sheet for Patients: bloggercourse.com  Fact Sheet for Healthcare Providers: seriousbroker.it  This test is not yet approved or cleared by the [place]  FDA and has been authorized for detection and/or diagnosis of SARS-CoV-2 by FDA under an Emergency Use Authorization (EUA). This EUA will remain in effect (meaning this test can be used) for the duration of the COVID-19 declaration under Section 564(b)(1) of the Act, 21 U.S.C. section 360bbb-3(b)(1), unless the authorization is terminated or revoked.  Performed at [facility], [address], [place], [place] [address]     RADIOLOGY STUDIES/RESULTS: CT ABDOMEN PELVIS WO CONTRAST Result Date: 10/31/2023 CLINICAL DATA:  Diverticulitis, complication suspected. EXAM: CT ABDOMEN AND PELVIS WITHOUT CONTRAST TECHNIQUE: Multidetector CT imaging of the abdomen and pelvis was performed following the standard protocol without IV contrast. RADIATION DOSE REDUCTION: This exam was performed according to the  departmental dose-optimization program which includes automated exposure control, adjustment of the mA and/or kV according to patient size and/or use of iterative reconstruction technique. COMPARISON:  10/27/2023. FINDINGS: Lower chest: Heart is enlarged and multi-vessel coronary artery calcifications are noted. Consolidation and patchy airspace disease is present at the lung bases bilaterally. Hepatobiliary: No focal liver abnormality is seen. Stones are present within the gallbladder. No biliary ductal dilatation. Pancreas: Unremarkable. No pancreatic ductal dilatation or surrounding inflammatory changes. Spleen: Normal in size without focal abnormality. Adrenals/Urinary Tract: The adrenal glands are within normal limits. Calcifications are present in the upper pole of the left kidney. There is a cyst in the lower pole of the right kidney. No hydronephrosis bilaterally. The bladder is unremarkable. Stomach/Bowel: The stomach is within normal limits. No bowel obstruction or pneumatosis is seen. Right hemicolectomy changes are noted. There is diffuse colonic wall thickening. Scattered diverticula are present along the colon. A few small foci of free air are noted in the mesentery in the mid left abdomen, slightly increased from the previous exam. There is suggestion of contrast extravasation in the region. There is fine no definite abscess is seen. Vascular/Lymphatic: Aortic atherosclerosis. No enlarged abdominal or pelvic lymph nodes. Reproductive: No acute abnormality. Other: Mild ascites is noted in all 4 quadrants. A fat containing umbilical hernia is noted. There is mild anasarca. Musculoskeletal: The bony structures are stable. IMPRESSION: 1. Worsening diverticulitis of the descending colon with perforation and increasing free air and suspected oral contrast extravasation in the mid left abdomen. No definite abscess is seen. Surgical consultation is recommended. 2. Mild ascites. 3. Diffuse colonic wall  thickening which may be due to associated inflammatory changes. 4. Patchy airspace disease at the lung bases with bilateral lower lobe consolidation. 5. Cholelithiasis. 6. Aortic atherosclerosis. Critical Value/emergent results were called by telephone at the time of interpretation on 10/31/2023 at 11:59 pm to provider Dr. Charlton, who verbally acknowledged these results. Electronically Signed   By: Leita Waddell HERO.D.  On: 10/31/2023 23:59      LOS: 5 days   Donalda Applebaum, MD  [facility]    To contact the attending provider between 7A-7P or the covering provider during after hours 7P-7A, please log into the web site [email or web address] and access using universal [place] password for that web site. If you do not have the password, please call the hospital operator.  11/01/2023, 10:03 AM

## 2023-11-01 NOTE — Progress Notes (Signed)
 Subjective: CC: Called patient that patient is no longer wanting comfort care and wishes to pursue HD and surgery.   Continues to have abdominal pain that is not worse from this am.   Objective: Vital signs in last 24 hours: Temp:  [98 F (36.7 C)-98.4 F (36.9 C)] 98.2 F (36.8 C) (02/07 1200) Pulse Rate:  [69-76] 73 (02/07 1200) Resp:  [16-21] 21 (02/07 1200) BP: (121-155)/(56-66) 121/56 (02/07 1200) SpO2:  [94 %-96 %] 96 % (02/07 1200) Weight:  [101.4 kg] 101.4 kg (02/07 0500) Last BM Date : 10/25/23  Intake/Output from previous day: 02/06 0701 - 02/07 0700 In: 3817.8 [P.O.:1240; I.V.:2327.8; IV Piggyback:250] Out: 200 [Urine:200] Intake/Output this shift: Total I/O In: 72.5 [I.V.:72.5] Out: -   PE: Gen:  Drowsy but A&Ox4. NAD.  Card:  Reg Pulm:  Rate and effort normal on o2 Abd: Soft, mild to moderate distension, generalized ttp without rigidity or guarding.  Psych: A&Ox4  Lab Results:  Recent Labs    10/31/23 0503 11/01/23 0532  WBC 13.7* 12.0*  HGB 7.4* 9.0*  HCT 22.7* 27.6*  PLT 337 384   BMET Recent Labs    10/31/23 0503 11/01/23 0532  NA 134* 129*  K 4.1 4.6  CL 102 97*  CO2 18* 14*  GLUCOSE 152* 236*  BUN 100* 106*  CREATININE 9.58* 10.48*  CALCIUM  7.6* 7.6*   PT/INR No results for input(s): LABPROT, INR in the last 72 hours. CMP     Component Value Date/Time   NA 129 (L) 11/01/2023 0532   NA 137 02/28/2021 1548   K 4.6 11/01/2023 0532   CL 97 (L) 11/01/2023 0532   CO2 14 (L) 11/01/2023 0532   GLUCOSE 236 (H) 11/01/2023 0532   BUN 106 (H) 11/01/2023 0532   BUN 33 (H) 02/28/2021 1548   CREATININE 10.48 (H) 11/01/2023 0532   CREATININE 3.38 (H) 12/26/2015 1347   CALCIUM  7.6 (L) 11/01/2023 0532   PROT 7.3 10/27/2023 1936   PROT 7.0 11/07/2017 1414   ALBUMIN  2.2 (L) 11/01/2023 0532   ALBUMIN  4.0 11/07/2017 1414   AST 17 10/27/2023 1936   ALT 9 10/27/2023 1936   ALKPHOS 44 10/27/2023 1936   BILITOT 1.0 10/27/2023  1936   BILITOT 0.2 11/07/2017 1414   GFRNONAA 4 (L) 11/01/2023 0532   GFRNONAA 13 (L) 01/10/2015 1428   GFRAA 21 (L) 12/21/2019 1802   GFRAA 16 (L) 01/10/2015 1428   Lipase     Component Value Date/Time   LIPASE 26 10/27/2023 1936    Studies/Results: CT ABDOMEN PELVIS WO CONTRAST Result Date: 10/31/2023 CLINICAL DATA:  Diverticulitis, complication suspected. EXAM: CT ABDOMEN AND PELVIS WITHOUT CONTRAST TECHNIQUE: Multidetector CT imaging of the abdomen and pelvis was performed following the standard protocol without IV contrast. RADIATION DOSE REDUCTION: This exam was performed according to the departmental dose-optimization program which includes automated exposure control, adjustment of the mA and/or kV according to patient size and/or use of iterative reconstruction technique. COMPARISON:  10/27/2023. FINDINGS: Lower chest: Heart is enlarged and multi-vessel coronary artery calcifications are noted. Consolidation and patchy airspace disease is present at the lung bases bilaterally. Hepatobiliary: No focal liver abnormality is seen. Stones are present within the gallbladder. No biliary ductal dilatation. Pancreas: Unremarkable. No pancreatic ductal dilatation or surrounding inflammatory changes. Spleen: Normal in size without focal abnormality. Adrenals/Urinary Tract: The adrenal glands are within normal limits. Calcifications are present in the upper pole of the left kidney. There is a cyst  in the lower pole of the right kidney. No hydronephrosis bilaterally. The bladder is unremarkable. Stomach/Bowel: The stomach is within normal limits. No bowel obstruction or pneumatosis is seen. Right hemicolectomy changes are noted. There is diffuse colonic wall thickening. Scattered diverticula are present along the colon. A few small foci of free air are noted in the mesentery in the mid left abdomen, slightly increased from the previous exam. There is suggestion of contrast extravasation in the region. There  is fine no definite abscess is seen. Vascular/Lymphatic: Aortic atherosclerosis. No enlarged abdominal or pelvic lymph nodes. Reproductive: No acute abnormality. Other: Mild ascites is noted in all 4 quadrants. A fat containing umbilical hernia is noted. There is mild anasarca. Musculoskeletal: The bony structures are stable. IMPRESSION: 1. Worsening diverticulitis of the descending colon with perforation and increasing free air and suspected oral contrast extravasation in the mid left abdomen. No definite abscess is seen. Surgical consultation is recommended. 2. Mild ascites. 3. Diffuse colonic wall thickening which may be due to associated inflammatory changes. 4. Patchy airspace disease at the lung bases with bilateral lower lobe consolidation. 5. Cholelithiasis. 6. Aortic atherosclerosis. Critical Value/emergent results were called by telephone at the time of interpretation on 10/31/2023 at 11:59 pm to provider Dr. Charlton, who verbally acknowledged these results. Electronically Signed   By: Leita Birmingham M.D.   On: 10/31/2023 23:59    Anti-infectives: Anti-infectives (From admission, onward)    Start     Dose/Rate Route Frequency Ordered Stop   11/01/23 1415  piperacillin -tazobactam (ZOSYN ) IVPB 2.25 g        2.25 g 100 mL/hr over 30 Minutes Intravenous Every 8 hours 11/01/23 1316     10/29/23 1100  piperacillin -tazobactam (ZOSYN ) IVPB 2.25 g  Status:  Discontinued        2.25 g 100 mL/hr over 30 Minutes Intravenous Every 8 hours 10/29/23 1006 11/01/23 1016   10/28/23 0600  piperacillin -tazobactam (ZOSYN ) IVPB 2.25 g  Status:  Discontinued        2.25 g 100 mL/hr over 30 Minutes Intravenous Every 8 hours 10/27/23 2224 10/27/23 2235   10/28/23 0600  ceFEPIme  (MAXIPIME ) 1 g in sodium chloride  0.9 % 100 mL IVPB  Status:  Discontinued        1 g 200 mL/hr over 30 Minutes Intravenous Every 24 hours 10/27/23 2235 10/29/23 0954   10/28/23 0600  metroNIDAZOLE  (FLAGYL ) IVPB 500 mg  Status:  Discontinued         500 mg 100 mL/hr over 60 Minutes Intravenous Every 12 hours 10/27/23 2235 10/29/23 0954   10/27/23 2130  piperacillin -tazobactam (ZOSYN ) IVPB 3.375 g        3.375 g 100 mL/hr over 30 Minutes Intravenous  Once 10/27/23 2128 10/27/23 2320        Assessment/Plan  Sigmoid diverticulitis with perforation  - CT 2/2 with above, no abscess - Hx of right hemicolectomy for polyps that were too large to be removed during colonoscopy  - CT yesterday with worsening diverticulitis with perforation and increasing free air and suspected oral contrast extravasation   Patient HDS without fever, tachycardia or hypotension. WBC 12 from 13.7. No peritonitis on exam. No indication for emergency surgery tonight. Agree with plan for HD tonight. Cr 10.48. BUN 106.   Will re-eval in AM for possible OR - exploratory laparotomy, colectomy, creation of colostomy. The planned procedure and material risks were discussed with the patient. Risks include but are not limited to anesthesia (MI, CVA, prolonged intubation, aspiration, death),  pain, bleeding,  infection, scarring, hernia, damage to surrounding structures (blood vessels/nerves/viscus/organs/ureter), ileus, and DVT/PE. We also discussed typical post-operative care including the possible need for rehab/snf if necessary. The patient's questions were answered to their satisfaction, they voiced understanding and elected to proceed with surgery if felt indicated in the AM. Despite her being A&O x 4 she is very drowsy and in the setting of worsening renal function would like to review this again with the patient in the AM. Cont Abx.    FEN: NPO VTE: heparin  gtt - hold at midnight  ID: cefepime /flagyl  2/3>2/4; Zosyn  2/4>>   - per [facility] -  PAF  HTN HLD AKI on CKD stage V - Cr 10 t CAD T2DM  I reviewed nursing notes, last 24 h vitals and pain scores, last 48 h intake and output, last 24 h labs and trends, and last 24 h imaging results.   LOS: 5 days     Ozell CHRISTELLA Shaper , [facility] Surgery 11/01/2023, 3:52 PM Please see Amion for pager number during day hours 7:00am-4:30pm

## 2023-11-01 NOTE — Progress Notes (Signed)
 Physical Therapy Treatment Patient Details Name: [name] MRN: [id number] DOB: 13-Jan-1956 Today's Date: 11/01/2023   History of Present Illness Patient is a 68 y.o.  female - who presented with abdominal pain-found to have acute diverticulitis with contained perforation involving the descending colon.  Past medical history of CKD 5, A-fib on Eliquis , CAD    PT Comments  Pt received in supine and agreeable to session. Pt appears more fatigued and demonstrates decreased activity tolerance this session. Pt able to tolerate short gait distance to the door and back, however is limited by quick fatigue and DOE on RA. Pt noted to be leaning onto RW for increased support during ambulation. Pt continues to benefit from PT services to progress toward functional mobility goals.     If plan is discharge home, recommend the following: A little help with walking and/or transfers;A little help with bathing/dressing/bathroom;Assistance with cooking/housework;Assist for transportation;Help with stairs or ramp for entrance   Can travel by private vehicle        Equipment Recommendations  None recommended by PT    Recommendations for Other Services       Precautions / Restrictions Precautions Precautions: Fall Restrictions Weight Bearing Restrictions Per Provider Order: No     Mobility  Bed Mobility Overal bed mobility: Needs Assistance Bed Mobility: Supine to Sit, Sit to Supine     Supine to sit: Supervision, HOB elevated, Used rails Sit to supine: Supervision, HOB elevated, Used rails   General bed mobility comments: increased time/effort and use of bed features    Transfers Overall transfer level: Needs assistance Equipment used: Rolling walker (2 wheels) Transfers: Sit to/from Stand Sit to Stand: Contact guard assist           General transfer comment: from EOB with CGA for safety    Ambulation/Gait Ambulation/Gait assistance: Min assist Gait Distance (Feet): 30  Feet Assistive device: Rolling walker (2 wheels) Gait Pattern/deviations: Wide base of support, Drifts right/left, Decreased stride length, Step-through pattern, Trunk flexed       General Gait Details: Pt requires frequent cues for RW proximity and upright posture. Min A for stability and line management. Pt noted to flex forward to lean forearms on RW with increased fatigue.       Balance Overall balance assessment: Needs assistance Sitting-balance support: Feet supported, No upper extremity supported Sitting balance-Leahy Scale: Good     Standing balance support: During functional activity, Bilateral upper extremity supported Standing balance-Leahy Scale: Poor Standing balance comment: with RW support                            Cognition Arousal: Alert Behavior During Therapy: WFL for tasks assessed/performed Overall Cognitive Status: Within Functional Limits for tasks assessed                                          Exercises      General Comments General comments (skin integrity, edema, etc.): SpO2 98% on 1L upon entry and 96% on RA sitting EOB. Pt noted to demonstrate increased DOE during ambulation and SpO2 reading as low as 82%, however question accuracy due to unstable pleth at times. Pt returned to 1L at end of session with SpO2 92%      Pertinent Vitals/Pain Pain Assessment Pain Assessment: Faces Faces Pain Scale: Hurts a little bit Pain Location: Abdominal  pain Pain Descriptors / Indicators: Sore, Discomfort Pain Intervention(s): Monitored during session     PT Goals (current goals can now be found in the care plan section) Acute Rehab PT Goals Patient Stated Goal: to go home PT Goal Formulation: With patient Time For Goal Achievement: 11/13/23 Progress towards PT goals: Progressing toward goals    Frequency    Min 1X/week       AM-PAC PT 6 Clicks Mobility   Outcome Measure  Help needed turning from your back  to your side while in a flat bed without using bedrails?: None Help needed moving from lying on your back to sitting on the side of a flat bed without using bedrails?: A Little Help needed moving to and from a bed to a chair (including a wheelchair)?: A Little Help needed standing up from a chair using your arms (e.g., wheelchair or bedside chair)?: A Little Help needed to walk in hospital room?: A Little Help needed climbing 3-5 steps with a railing? : Total 6 Click Score: 17    End of Session Equipment Utilized During Treatment: Gait belt Activity Tolerance: Patient tolerated treatment well Patient left: in bed;with call bell/phone within reach;with nursing/sitter in room Nurse Communication: Mobility status PT Visit Diagnosis: Other abnormalities of gait and mobility (R26.89)     Time: 8551-8495 PT Time Calculation (min) (ACUTE ONLY): 16 min  Charges:    $Gait Training: 8-22 mins PT General Charges $$ ACUTE PT VISIT: 1 Visit                     Darryle George, PTA Acute Rehabilitation Services Secure Chat Preferred  [phone]    Darryle George 11/01/2023, 3:28 PM

## 2023-11-01 NOTE — Progress Notes (Signed)
 PHARMACY - ANTICOAGULATION CONSULT NOTE  Pharmacy Consult:  Heparin  Indication: atrial fibrillation  Allergies  Allergen Reactions   Nsaids Other (See Comments)    CKD stage 4   Lisinopril Other (See Comments)    coughing   Peanut-Containing Drug Products Itching and Other (See Comments)    GI intolerance - diarrhea    Patient Measurements: Height: 5' 7 (170.2 cm) Weight: 101.4 kg (223 lb 8.7 oz) IBW/kg (Calculated) : 61.6 Heparin  Dosing Weight: 83 kg  Vital Signs: Temp: 98.4 F (36.9 C) (02/07 0338) Temp Source: Oral (02/07 0338) BP: 129/64 (02/07 0338) Pulse Rate: 69 (02/06 2336)  Labs: Recent Labs    10/30/23 0501 10/30/23 1427 10/30/23 1836 10/31/23 0503 10/31/23 1759 11/01/23 0532  HGB 7.9*  --   --  7.4*  --  9.0*  HCT 24.6*  --   --  22.7*  --  27.6*  PLT 356  --   --  337  --  384  APTT 39* >200* 54* 76*  --   --   HEPARINUNFRC 0.48  --   --  0.58 0.69 0.80*  CREATININE 9.26*  --   --  9.58*  --  10.48*    Estimated Creatinine Clearance: 6.3 mL/min (A) (by C-G formula based on SCr of 10.48 mg/dL (H)).  Medications:  -PTA eliquis  2.5mg  PO BID (last dose 10/27/2023 AM)  Assessment: 38 yoF presents with abdominal pain with complicated diverticulitis w/ contained perforation and possible need for colectomy. PMH: CKD 5, A fib, CAD, and CVA.  Pharmacy consulted to dose IV heparin  for Afib.  Heparin  came back elevated this AM. Repeat CT showed worsening diverticulitis and perforation.  Hgb 9, plt wnl  Goal of Therapy:  Heparin  level 0.3-0.7 units/ml Monitor platelets by anticoagulation protocol: Yes   Plan:  Decrease heparin  1800 units/hr 8 hr HL Check daily heparin  level and CBC  Sergio Batch, PharmD, BCIDP, AAHIVP, CPP Infectious Disease Pharmacist 11/01/2023 7:26 AM

## 2023-11-01 NOTE — Progress Notes (Signed)
 Patient ID: [name], female   DOB: Aug 17, 1956, 68 y.o.   MRN: [id number] [facility] Progress Note   Assessment/ Plan:   1.  Acute kidney injury on chronic kidney disease stage V: Advanced proteinuric chronic kidney disease at baseline that appears to be from a combination of diabetes and hypertension.  Acute injury appears to be hemodynamically mediated in the setting of acute diverticulitis with contained perforation.  Her labs show continued worsening of renal function and she appears to be getting mentally more lethargic which is likely representative of early uremic symptoms.  Whereas she does not have any acute electrolyte abnormality or volume overload, it would be prudent to start her on dialysis at this time especially if surgical intervention is going to be undertaken for her acute diverticulitis with contained perforation.  I will request assistance from interventional radiology with placement of a tunneled dialysis catheter.  Permanent access options can be explored next week. 2.  Acute diverticulitis with contained perforation of descending colon: On Zosyn  and ongoing pain management.  Now amenable to surgical intervention. 3.  Anion gap metabolic acidosis: Likely secondary to combination of acute kidney injury and ongoing infection, monitor with sodium bicarbonate . 4.  Anemia: Likely secondary to chronic illness, will check iron  studies to decide on additional management including IV iron /ESA.  Subjective:   She earlier reported to the hospitalist that she did not want to undertake dialysis and palliative care was involved with the initial plans to transition to comfort measures only.  She has had some more introspection and now would like to undertake dialysis (realizing that this would be long-term) and surgical intervention for her diverticulitis with contained perforation.   Objective:   BP (!) 121/56 (BP Location: Right Arm)   Pulse 73   Temp 98.2 F (36.8  C) (Oral)   Resp (!) 21   Ht 5' 7 (1.702 m)   Wt 101.4 kg   SpO2 96%   BMI 35.01 kg/m   Intake/Output Summary (Last 24 hours) at 11/01/2023 1307 Last data filed at 11/01/2023 1100 Gross per 24 hour  Intake 2297.22 ml  Output 200 ml  Net 2097.22 ml   Weight change: 4.285 kg  Physical Exam: Gen: Appears fatigued/uncomfortable resting in bed.  Awakens and engages in conversation CVS: Pulse regular rhythm, normal rate, S1 and S2 normal Resp: Clear to auscultation bilaterally with diminished breath sounds over bases, no rales Abd: Soft, obese, tenderness over left lower quadrant Ext: Trace ankle edema.  No asterixis/myoclonic jerks noted  Imaging: CT ABDOMEN PELVIS WO CONTRAST Result Date: 10/31/2023 CLINICAL DATA:  Diverticulitis, complication suspected. EXAM: CT ABDOMEN AND PELVIS WITHOUT CONTRAST TECHNIQUE: Multidetector CT imaging of the abdomen and pelvis was performed following the standard protocol without IV contrast. RADIATION DOSE REDUCTION: This exam was performed according to the departmental dose-optimization program which includes automated exposure control, adjustment of the mA and/or kV according to patient size and/or use of iterative reconstruction technique. COMPARISON:  10/27/2023. FINDINGS: Lower chest: Heart is enlarged and multi-vessel coronary artery calcifications are noted. Consolidation and patchy airspace disease is present at the lung bases bilaterally. Hepatobiliary: No focal liver abnormality is seen. Stones are present within the gallbladder. No biliary ductal dilatation. Pancreas: Unremarkable. No pancreatic ductal dilatation or surrounding inflammatory changes. Spleen: Normal in size without focal abnormality. Adrenals/Urinary Tract: The adrenal glands are within normal limits. Calcifications are present in the upper pole of the left kidney. There is a cyst in the lower pole of the  right kidney. No hydronephrosis bilaterally. The bladder is unremarkable.  Stomach/Bowel: The stomach is within normal limits. No bowel obstruction or pneumatosis is seen. Right hemicolectomy changes are noted. There is diffuse colonic wall thickening. Scattered diverticula are present along the colon. A few small foci of free air are noted in the mesentery in the mid left abdomen, slightly increased from the previous exam. There is suggestion of contrast extravasation in the region. There is fine no definite abscess is seen. Vascular/Lymphatic: Aortic atherosclerosis. No enlarged abdominal or pelvic lymph nodes. Reproductive: No acute abnormality. Other: Mild ascites is noted in all 4 quadrants. A fat containing umbilical hernia is noted. There is mild anasarca. Musculoskeletal: The bony structures are stable. IMPRESSION: 1. Worsening diverticulitis of the descending colon with perforation and increasing free air and suspected oral contrast extravasation in the mid left abdomen. No definite abscess is seen. Surgical consultation is recommended. 2. Mild ascites. 3. Diffuse colonic wall thickening which may be due to associated inflammatory changes. 4. Patchy airspace disease at the lung bases with bilateral lower lobe consolidation. 5. Cholelithiasis. 6. Aortic atherosclerosis. Critical Value/emergent results were called by telephone at the time of interpretation on 10/31/2023 at 11:59 pm to provider Dr. Charlton, who verbally acknowledged these results. Electronically Signed   By: Leita Birmingham M.D.   On: 10/31/2023 23:59    Labs: BMET Recent Labs  Lab 10/27/23 1936 10/28/23 0522 10/29/23 0725 10/30/23 0501 10/31/23 0503 11/01/23 0532  NA 134* 134* 138 135 134* 129*  K 4.8 4.2 4.4 4.4 4.1 4.6  CL 100 102 103 102 102 97*  CO2 18* 18* 19* 18* 18* 14*  GLUCOSE 234* 267* 178* 209* 152* 236*  BUN 77* 76* 95* 101* 100* 106*  CREATININE 7.05* 7.33* 8.43* 9.26* 9.58* 10.48*  CALCIUM  8.4* 8.0* 8.0* 7.9* 7.6* 7.6*  PHOS  --  6.7* 7.1* 6.8* 6.1* 7.6*   CBC Recent Labs  Lab  10/27/23 1936 10/28/23 0522 10/29/23 0725 10/30/23 0501 10/31/23 0503 11/01/23 0532  WBC 16.8*   < > 17.9* 17.3* 13.7* 12.0*  NEUTROABS 15.2*  --   --   --   --   --   HGB 9.8*   < > 8.3* 7.9* 7.4* 9.0*  HCT 31.5*   < > 26.4* 24.6* 22.7* 27.6*  MCV 82.9   < > 82.2 81.2 79.9* 79.8*  PLT 435*   < > 357 356 337 384   < > = values in this interval not displayed.    Medications:     sodium chloride  flush  3 mL Intravenous Q12H    Gordy Blanch, MD 11/01/2023, 1:07 PM

## 2023-11-01 NOTE — Progress Notes (Addendum)
 Brief note Informed by RN that patient has had further discussion with the children-other family members at MRI from [place]-and that she has now changed her mind-she wants full scope of treatment including dialysis/surgery.  She is now a full code.  I am resuming most of her prior medications.  Nephrology aware-will make general surgery aware as well.  Keep NPO.

## 2023-11-01 NOTE — Progress Notes (Signed)
 Notified by radiology that CT is concerning for worsening diverticulitis with increased free air and suspected extravasation of oral contrast.   The patient was seen and we discussed the results. She reports 3/10 generalized abdominal pain that is improved from yesterday.   She is in a difficult situation with AKI superimposed on CKD V and has indicated that she does not want dialysis. Fortunately, she is feeling okay currently and is hemodynamically stable.   Surgery (Dr. Sebastian) was updated on the CT results.

## 2023-11-01 NOTE — Consult Note (Signed)
 WOC Nurse requested for preoperative stoma site marking  Discussed surgical procedure and stoma creation with patient and family.  Explained role of the WOC nurse team.  Provided the patient with educational booklet and provided samples of pouching options.  Answered patient and family questions.   Examined patient lying, sitting, and standing in order to place the marking in the patient's visual field, away from any creases or abdominal contour issues and within the rectus muscle.  Attempted to mark below the patient's belt line.   Marked for colostomy in the LLQ  6 cm to the left of the umbilicus and 5 cm above/below the umbilicus.  Marked for ileostomy in the RLQ  6 cm to the right of the umbilicus and  5 cm above/below the umbilicus.  Patient's abdomen cleansed with CHG wipes at site markings, allowed to air dry prior to marking.Covered mark with thin film transparent dressing to preserve mark until date of surgery.   WOC Nurse team will follow up with patient after surgery for continue ostomy care and teaching.   Thank you, Lela Holm BSN, RN, [facility], WOC  (Pager: [phone])

## 2023-11-01 NOTE — Progress Notes (Signed)
 Dilaudid  IV infusion ordered for patient as patient had advised MD she wished to pursue comfort care.   RN proceeded to patient's room to administer IV infusion and patient stated she was considering to pursue treatment vs hospice so RN did not hang IV infusion.  Patient has made decision to pursue surgery and dialysis so IV dilaudid  infusion order discontinued.    Spoke with pharmacy to see if drip could be returned and they stated it could not and to waste IV infusion.   Carlin Balloon RN witnessed waste of 50 mL of IV dilaudid  infusion in steri cycle container.

## 2023-11-01 NOTE — Progress Notes (Signed)
 Received patient in bed to unit.  Alert and oriented.  Informed consent signed and in chart.   TX duration:2 hours  Patient tolerated well.  Transported back to the room  Alert, without acute distress.  Hand-off given to patient's nurse.   Access used: dialysis cath Access issues: venous port flushing well , no blood return   Total UF removed: Medication(s) given: heplock 1.4units per port Post HD VS: see table below Post HD weight: 100.5kg   11/01/23 2212  Vitals  Temp 97.7 F (36.5 C)  Temp Source Oral  BP (!) 126/50  MAP (mmHg) 73  BP Location Right Arm  BP Method Automatic  Patient Position (if appropriate) Lying  Pulse Rate 67  Pulse Rate Source Monitor  ECG Heart Rate 70  Resp 20  Oxygen Therapy  SpO2 100 %  O2 Device Nasal Cannula  O2 Flow Rate (L/min) 1 L/min  Patient Activity (if Appropriate) In bed  Pulse Oximetry Type Continuous  During Treatment Monitoring  Blood Flow Rate (mL/min) 0 mL/min  Arterial Pressure (mmHg) 1.21 mmHg  Venous Pressure (mmHg) 1.21 mmHg  TMP (mmHg) 28.89 mmHg  Ultrafiltration Rate (mL/min) 548 mL/min  Dialysate Flow Rate (mL/min) 299 ml/min  Dialysate Potassium Concentration 2  Dialysate Calcium  Concentration 2.5  Duration of HD Treatment -hour(s) 2 hour(s)  Cumulative Fluid Removed (mL) per Treatment  500.12  HD Safety Checks Performed Yes  Intra-Hemodialysis Comments Tolerated well  Post Treatment  Dialyzer Clearance Lightly streaked  Hemodialysis Intake (mL) 0 mL  Liters Processed 24  Fluid Removed (mL) 500 mL  Tolerated HD Treatment Yes  Post-Hemodialysis Comments goal met  Hemodialysis Catheter Right Internal jugular Triple lumen Temporary (Non-Tunneled)  Placement Date/Time: 11/01/23 1617   Serial / Lot #: [id number]  Expiration Date: 03/22/28  Time Out: Correct patient;Correct site;Correct procedure  Maximum sterile barrier precautions: Hand hygiene;Cap;Mask;Sterile gown;Sterile gloves;Large sterile ...  Site  Condition No complications  Blue Lumen Status Infusing  Red Lumen Status Infusing  Catheter fill solution Heparin  1000 units/ml  Catheter fill volume (Arterial) 1.4 cc  Catheter fill volume (Venous) 1.4  Post treatment catheter status Capped and Clamped      Lorrene KANDICE [facility]

## 2023-11-01 NOTE — Care Management Important Message (Signed)
 Important Message  Patient Details  Name: [name] MRN: [id number] Date of Birth: [birth year]   Important Message Given:  Yes - Medicare IM     Wynonia Hedges 11/01/2023, 3:22 PM

## 2023-11-01 NOTE — Progress Notes (Deleted)
 Patient ID: [name], female   DOB: [birth year], 68 y.o.   MRN: [id number] The plan is noted for transitioning to comfort care measures with worsening renal function and her decision not to undertake chronic hemodialysis.  Nephrology will remain available for questions or concerns contributing to her management.  Gordy Blanch MD [facility]. Office # [phone] Pager # [phone] 10:53 AM

## 2023-11-01 NOTE — TOC Initial Note (Addendum)
 Transition of Care [facility]) - Initial/Assessment Note    Patient Details  Name: [name] MRN: [id number] Date of Birth: 1956/01/29  Transition of Care [facility]) CM/SW Contact:    Inocente GORMAN Kindle, LCSW Phone Number: 11/01/2023, 10:59 AM  Clinical Narrative:                 11am-CSW received consult for hospice facility placement. Patient prefers her son to assist with the process. CSW spoke with [name] and offered choice. He requests [facility]. CSW sent referral to [facility] for review and bed availability.   1pm-CSW received call from MD stating that patient is now requesting to be Full Code and start dialysis. Hospice updated. TOC will continue to follow.    Expected Discharge Plan: Hospice Medical Facility Barriers to Discharge: Hospice Bed not available   Patient Goals and CMS Choice Patient states their goals for this hospitalization and ongoing recovery are:: Comfort CMS Medicare.gov Compare Post Acute Care list provided to:: Patient Represenative (must comment) Choice offered to / list presented to : Adult Children      Expected Discharge Plan and Services In-house Referral: Clinical Social Work   Post Acute Care Choice: Residential Hospice Bed Living arrangements for the past 2 months: Single Family Home                                      Prior Living Arrangements/Services Living arrangements for the past 2 months: Single Family Home   Patient language and need for interpreter reviewed:: Yes Do you feel safe going back to the place where you live?: Yes      Need for Family Participation in Patient Care: Yes (Comment) Care giver support system in place?: Yes (comment)   Criminal Activity/Legal Involvement Pertinent to Current Situation/Hospitalization: No - Comment as needed  Activities of Daily Living   ADL Screening (condition at time of admission) Independently performs ADLs?: Yes (appropriate for developmental age) Is the patient deaf  or have difficulty hearing?: No Does the patient have difficulty seeing, even when wearing glasses/contacts?: Yes Does the patient have difficulty concentrating, remembering, or making decisions?: No  Permission Sought/Granted Permission sought to share information with : Facility Medical Sales Representative, Family Supports Permission granted to share information with : Yes, Verbal Permission Granted  Share Information with NAME: [name]  Permission granted to share info w AGENCY: Hospice  Permission granted to share info w Relationship: Son  Permission granted to share info w Contact Information: [phone]  Emotional Assessment Appearance:: Appears stated age Attitude/Demeanor/Rapport: Engaged Affect (typically observed): Accepting Orientation: : Oriented to Self, Oriented to Place, Oriented to  Time, Oriented to Situation Alcohol  / Substance Use: Not Applicable Psych Involvement: No (comment)  Admission diagnosis:  Hyperglycemia [R73.9] Chronic anemia [D64.9] AKI (acute kidney injury) (HCC) [N17.9] Diverticulitis of intestine with perforation without bleeding, unspecified part of intestinal tract [K57.80] Diverticulitis of large intestine with complication [K57.32] Patient Active Problem List   Diagnosis Date Noted   Diverticulitis of large intestine with complication 10/27/2023   Chronic kidney disease, stage 5 (HCC) 10/27/2023   Chronic nonintractable headache 03/03/2021   Foot callus 02/27/2021   Counseled about COVID-19 virus infection 02/24/2020   Aortic heart murmur 05/15/2019   Thyroid  enlargement 07/19/2014   Vitamin D  deficiency 09/03/2013   Hyperlipidemia 04/24/2013   Malunion of fracture 03/17/2013   Wound infection after surgery 03/17/2013   Diabetic retinopathy (HCC)  01/23/2013   Chronic atrial fibrillation (HCC) 01/15/2013   CAD (coronary artery disease) 01/11/2013   H/O non-ST elevation myocardial infarction (NSTEMI) 01/09/2013   History of stroke 01/08/2013    Diabetic neuropathy 01/08/2013   Hypertension    Diabetes mellitus    Chronic kidney disease (CKD), stage IV (severe) (HCC)    PCP:  Campbell Reynolds, NP Pharmacy:   [facility] - [place], [place] - [address] [place] [address] Phone: [phone] Fax: [phone]  [facility] [address] [place]), [place] - [address] [place] ([place]) [place] [address] Phone: [phone] Fax: [phone]     Social Drivers of Health (SDOH) Social History: SDOH Screenings   Food Insecurity: No Food Insecurity (10/28/2023)  Housing: Low Risk  (10/28/2023)  Transportation Needs: No Transportation Needs (10/28/2023)  Utilities: Not At Risk (10/28/2023)  Depression (PHQ2-9): Low Risk  (02/28/2021)  Financial Resource Strain: Low Risk  (08/25/2021)   Received from [facility]  Physical Activity: Insufficiently Active (06/20/2021)   Received from [facility], [facility]  Social Connections: Socially Isolated (10/28/2023)  Stress: No Stress Concern Present (08/25/2021)   Received from [facility]  Tobacco Use: Low Risk  (10/28/2023)   SDOH Interventions: Social Connections Interventions: [employer] Provided, Inpatient TOC   Readmission Risk Interventions     No data to display

## 2023-11-01 NOTE — Inpatient Diabetes Management (Signed)
 Inpatient Diabetes Program Recommendations  AACE/ADA: New Consensus Statement on Inpatient Glycemic Control  Target Ranges:  Prepandial:   less than 140 mg/dL      Peak postprandial:   less than 180 mg/dL (1-2 hours)      Critically ill patients:  140 - 180 mg/dL    Latest Reference Range & Units 11/01/23 08:05 11/01/23 11:51  Glucose-Capillary 70 - 99 mg/dL 762 (H) 769 (H)    Latest Reference Range & Units 10/31/23 07:48 10/31/23 11:51 10/31/23 15:52 10/31/23 21:28  Glucose-Capillary 70 - 99 mg/dL 856 (H) 862 (H) 831 (H) 173 (H)    Review of Glycemic Control  Current orders for Inpatient glycemic control: Novolog  0-6 units TID with meals  Inpatient Diabetes Program Recommendations:    Insulin : May want to consider ordering Semglee  6 units Q24H.  Thanks, Earnie Gainer, RN, MSN, CDCES Diabetes Coordinator Inpatient Diabetes Program [phone] (Team Pager from 8am to 5pm)

## 2023-11-01 NOTE — Progress Notes (Addendum)
 Daily Progress Note   Patient Name: [name]       Date: 11/01/2023 DOB: [birth year]  Age: 68 y.o. MRN#: [id number] Attending Physician: Raenelle Donalda HERO, MD Primary Care Physician: Campbell Reynolds, NP Admit Date: 10/27/2023  Reason for Consultation/Follow-up: Establishing goals of care   Length of Stay: 5  Current Medications: Scheduled Meds:  . sodium chloride  flush  3 mL Intravenous Q12H    Continuous Infusions: . sodium chloride     . HYDROmorphone       PRN Meds: sodium chloride , glycopyrrolate  **OR** glycopyrrolate  **OR** glycopyrrolate , haloperidol  **OR** haloperidol  **OR** haloperidol  lactate, HYDROmorphone , LORazepam  **OR** LORazepam  **OR** LORazepam , LORazepam , ondansetron  **OR** ondansetron  (ZOFRAN ) IV, polyvinyl alcohol , sodium chloride  flush  Physical Exam Vitals reviewed.             Vital Signs: BP (!) 143/56 (BP Location: Right Arm)   Pulse 76   Temp 98.1 F (36.7 C) (Oral)   Resp 16   Ht 5' 7 (1.702 m)   Wt 101.4 kg   SpO2 94%   BMI 35.01 kg/m  SpO2: SpO2: 94 % O2 Device: O2 Device: Nasal Cannula O2 Flow Rate: O2 Flow Rate (L/min): 1 L/min     Patient Active Problem List   Diagnosis Date Noted  . Diverticulitis of large intestine with complication 10/27/2023  . Chronic kidney disease, stage 5 (HCC) 10/27/2023  . Chronic nonintractable headache 03/03/2021  . Foot callus 02/27/2021  . Counseled about COVID-19 virus infection 02/24/2020  . Aortic heart murmur 05/15/2019  . Thyroid  enlargement 07/19/2014  . Vitamin D  deficiency 09/03/2013  . Hyperlipidemia 04/24/2013  . Malunion of fracture 03/17/2013  . Wound infection after surgery 03/17/2013  . Diabetic retinopathy (HCC) 01/23/2013  . Chronic atrial fibrillation (HCC) 01/15/2013  . CAD  (coronary artery disease) 01/11/2013  . H/O non-ST elevation myocardial infarction (NSTEMI) 01/09/2013  . History of stroke 01/08/2013  . Diabetic neuropathy 01/08/2013  . Hypertension   . Diabetes mellitus   . Chronic kidney disease (CKD), stage IV (severe) [facility])     Palliative Care Assessment & Plan   Patient Profile: 68 y.o. female  with past medical history of CKD stage V, A-fib on Eliquis , DM type 2, CAD, and HTN who presented to the ED on 10/27/2023 with abdominal pain and was found to have acute diverticulitis with  contained perforation involving the descending colon.  She also has AKI on CKD stage V, and BUN/creatinine is trending up.   Palliative Medicine has been consulted for goals of care and medical decision making.  Today's Discussion: Patient sitting up in bed in NAD she is on the phone. Patient shares she has decided to transition to comfort care/ hospice. She shares she had a thorough discussion with both PMT Recardo yesterday and Dr. Raenelle today. She has no questions or concerns at this time. Shared with her the steps moving forward from here including TOC will reach out to her to discuss inpatient hospice options. She would like her son to be the point of contact. Encouraged patient and family to call PMT with questions or concerns.  1300: Received message that patient has decided to remain FULL code and proceed with dialysis and surgery. Dr. Raenelle went to patient room to confirm. I will plan to follow up tomorrow 11/02/23.  Recommendations/Plan: Full code Full scope Dialysis and surgery PMT will continue to support   Code Status:    Code Status Orders  (From admission, onward)           Start     Ordered   11/01/23 1016  Do not attempt resuscitation (DNR) - Comfort care  Continuous       Question Answer Comment  If patient has no pulse and is not breathing Do Not Attempt Resuscitation   In Pre-Arrest Conditions (Patient Is Breathing and Has a Pulse) Provide  comfort measures. Relieve any mechanical airway obstruction. Avoid transfer unless required for comfort.   Consent: Discussion documented in EHR or advanced directives reviewed      11/01/23 1016           Extensive chart review has been completed prior to seeing the patient including labs, vital signs, imaging, progress/consult notes, orders, medications, and available advance directive documents.  Care plan was discussed with Dr. Raenelle and SW  Time: 35 minutes  Thank you for allowing the Palliative Medicine Team to assist in the care of this patient.     Stephane CHRISTELLA Palin, NP  Please contact Palliative Medicine Team phone at [phone] for questions and concerns.

## 2023-11-01 NOTE — Progress Notes (Signed)
 Progress Note     Subjective: Pt remains resolute that she would not want to undergo HD in setting of worsening renal function. She understands that means that she will die and would like to pursue hospice placement. She is still having some abdominal pain that seems to be the worst in the morning and evening but improves with medication. We discussed that surgery at this point is obviously not recommended despite worsening diverticulitis on repeat imaging. She is accepting of this.    Objective: Vital signs in last 24 hours: Temp:  [97.8 F (36.6 C)-98.4 F (36.9 C)] 98.1 F (36.7 C) (02/07 0753) Pulse Rate:  [69-76] 76 (02/07 0753) Resp:  [16-20] 16 (02/07 0753) BP: (129-156)/(56-67) 143/56 (02/07 0753) SpO2:  [94 %-99 %] 94 % (02/07 0815) Weight:  [101.4 kg] 101.4 kg (02/07 0500) Last BM Date : 10/25/23  Intake/Output from previous day: 02/06 0701 - 02/07 0700 In: 3817.8 [P.O.:1240; I.V.:2327.8; IV Piggyback:250] Out: 200 [Urine:200] Intake/Output this shift: Total I/O In: 18.4 [I.V.:18.4] Out: -   PE: General: pleasant, WD, WN female who is laying in bed in NAD Heart: regular, rate, and rhythm.   Lungs: Respiratory effort nonlabored Abd: soft, mildly TTP in LLQ and suprapubic abdomen without peritonitis, mild distention Psych: A&Ox3 with an appropriate affect.    Lab Results:  Recent Labs    10/31/23 0503 11/01/23 0532  WBC 13.7* 12.0*  HGB 7.4* 9.0*  HCT 22.7* 27.6*  PLT 337 384   BMET Recent Labs    10/31/23 0503 11/01/23 0532  NA 134* 129*  K 4.1 4.6  CL 102 97*  CO2 18* 14*  GLUCOSE 152* 236*  BUN 100* 106*  CREATININE 9.58* 10.48*  CALCIUM  7.6* 7.6*   PT/INR No results for input(s): LABPROT, INR in the last 72 hours.  CMP     Component Value Date/Time   NA 129 (L) 11/01/2023 0532   NA 137 02/28/2021 1548   K 4.6 11/01/2023 0532   CL 97 (L) 11/01/2023 0532   CO2 14 (L) 11/01/2023 0532   GLUCOSE 236 (H) 11/01/2023 0532   BUN 106  (H) 11/01/2023 0532   BUN 33 (H) 02/28/2021 1548   CREATININE 10.48 (H) 11/01/2023 0532   CREATININE 3.38 (H) 12/26/2015 1347   CALCIUM  7.6 (L) 11/01/2023 0532   PROT 7.3 10/27/2023 1936   PROT 7.0 11/07/2017 1414   ALBUMIN  2.2 (L) 11/01/2023 0532   ALBUMIN  4.0 11/07/2017 1414   AST 17 10/27/2023 1936   ALT 9 10/27/2023 1936   ALKPHOS 44 10/27/2023 1936   BILITOT 1.0 10/27/2023 1936   BILITOT 0.2 11/07/2017 1414   GFRNONAA 4 (L) 11/01/2023 0532   GFRNONAA 13 (L) 01/10/2015 1428   GFRAA 21 (L) 12/21/2019 1802   GFRAA 16 (L) 01/10/2015 1428   Lipase     Component Value Date/Time   LIPASE 26 10/27/2023 1936       Studies/Results: CT ABDOMEN PELVIS WO CONTRAST Result Date: 10/31/2023 CLINICAL DATA:  Diverticulitis, complication suspected. EXAM: CT ABDOMEN AND PELVIS WITHOUT CONTRAST TECHNIQUE: Multidetector CT imaging of the abdomen and pelvis was performed following the standard protocol without IV contrast. RADIATION DOSE REDUCTION: This exam was performed according to the departmental dose-optimization program which includes automated exposure control, adjustment of the mA and/or kV according to patient size and/or use of iterative reconstruction technique. COMPARISON:  10/27/2023. FINDINGS: Lower chest: Heart is enlarged and multi-vessel coronary artery calcifications are noted. Consolidation and patchy airspace disease is present at  the lung bases bilaterally. Hepatobiliary: No focal liver abnormality is seen. Stones are present within the gallbladder. No biliary ductal dilatation. Pancreas: Unremarkable. No pancreatic ductal dilatation or surrounding inflammatory changes. Spleen: Normal in size without focal abnormality. Adrenals/Urinary Tract: The adrenal glands are within normal limits. Calcifications are present in the upper pole of the left kidney. There is a cyst in the lower pole of the right kidney. No hydronephrosis bilaterally. The bladder is unremarkable. Stomach/Bowel: The  stomach is within normal limits. No bowel obstruction or pneumatosis is seen. Right hemicolectomy changes are noted. There is diffuse colonic wall thickening. Scattered diverticula are present along the colon. A few small foci of free air are noted in the mesentery in the mid left abdomen, slightly increased from the previous exam. There is suggestion of contrast extravasation in the region. There is fine no definite abscess is seen. Vascular/Lymphatic: Aortic atherosclerosis. No enlarged abdominal or pelvic lymph nodes. Reproductive: No acute abnormality. Other: Mild ascites is noted in all 4 quadrants. A fat containing umbilical hernia is noted. There is mild anasarca. Musculoskeletal: The bony structures are stable. IMPRESSION: 1. Worsening diverticulitis of the descending colon with perforation and increasing free air and suspected oral contrast extravasation in the mid left abdomen. No definite abscess is seen. Surgical consultation is recommended. 2. Mild ascites. 3. Diffuse colonic wall thickening which may be due to associated inflammatory changes. 4. Patchy airspace disease at the lung bases with bilateral lower lobe consolidation. 5. Cholelithiasis. 6. Aortic atherosclerosis. Critical Value/emergent results were called by telephone at the time of interpretation on 10/31/2023 at 11:59 pm to provider Dr. Charlton, who verbally acknowledged these results. Electronically Signed   By: Leita Birmingham M.D.   On: 10/31/2023 23:59     Anti-infectives: Anti-infectives (From admission, onward)    Start     Dose/Rate Route Frequency Ordered Stop   10/29/23 1100  piperacillin -tazobactam (ZOSYN ) IVPB 2.25 g        2.25 g 100 mL/hr over 30 Minutes Intravenous Every 8 hours 10/29/23 1006     10/28/23 0600  piperacillin -tazobactam (ZOSYN ) IVPB 2.25 g  Status:  Discontinued        2.25 g 100 mL/hr over 30 Minutes Intravenous Every 8 hours 10/27/23 2224 10/27/23 2235   10/28/23 0600  ceFEPIme  (MAXIPIME ) 1 g in sodium  chloride 0.9 % 100 mL IVPB  Status:  Discontinued        1 g 200 mL/hr over 30 Minutes Intravenous Every 24 hours 10/27/23 2235 10/29/23 0954   10/28/23 0600  metroNIDAZOLE  (FLAGYL ) IVPB 500 mg  Status:  Discontinued        500 mg 100 mL/hr over 60 Minutes Intravenous Every 12 hours 10/27/23 2235 10/29/23 0954   10/27/23 2130  piperacillin -tazobactam (ZOSYN ) IVPB 3.375 g        3.375 g 100 mL/hr over 30 Minutes Intravenous  Once 10/27/23 2128 10/27/23 2320        Assessment/Plan  Sigmoid diverticulitis with perforation  - CT 2/2 with above, no abscess - hx of right hemicolectomy for polyps that were too large to be removed during colonoscopy  - CT yesterday with worsening diverticulitis   In setting of worsening renal function and decision to forgo HD, there is no role for acute surgical intervention in this patient. She should be allowed to eat for comfort and would agree with recommendations for hospice. With that in mind, discontinuation of abx would also be appropriate. General surgery will sign off at this time  but are available as needed.   FEN: CLD VTE: heparin  gtt ID: cefepime /flagyl  2/3>2/4; Zosyn  2/4>>  - per TRH -  PAF  HTN HLD AKI on CKD stage V - Cr 10 today, pt clear with me that she would not want to undergo HD, not a candidate for PD. Considering hospice CAD T2DM  LOS: 5 days   I reviewed Consultant nephrology, palliative notes, hospitalist notes, last 24 h vitals and pain scores, last 48 h intake and output, last 24 h labs and trends, and last 24 h imaging results.  This care required moderate level of medical decision making.    Burnard JONELLE Louder, [facility] 11/01/2023, 9:12 AM Please see Amion for pager number during day hours 7:00am-4:30pm

## 2023-11-02 ENCOUNTER — Encounter (HOSPITAL_COMMUNITY)
Admission: EM | Disposition: A | Discharge status: Skilled Nursing Facility | Payer: Self-pay | Source: Home / Self Care | Attending: Internal Medicine

## 2023-11-02 ENCOUNTER — Other Ambulatory Visit: Payer: Self-pay

## 2023-11-02 ENCOUNTER — Encounter (HOSPITAL_COMMUNITY): Payer: Self-pay | Admitting: Internal Medicine

## 2023-11-02 ENCOUNTER — Inpatient Hospital Stay (HOSPITAL_COMMUNITY): Payer: Medicare PPO | Admitting: Anesthesiology

## 2023-11-02 DIAGNOSIS — I2510 Atherosclerotic heart disease of native coronary artery without angina pectoris: Secondary | ICD-10-CM

## 2023-11-02 DIAGNOSIS — I10 Essential (primary) hypertension: Secondary | ICD-10-CM

## 2023-11-02 DIAGNOSIS — K5780 Diverticulitis of intestine, part unspecified, with perforation and abscess without bleeding: Secondary | ICD-10-CM | POA: Diagnosis not present

## 2023-11-02 DIAGNOSIS — Z7189 Other specified counseling: Secondary | ICD-10-CM | POA: Diagnosis not present

## 2023-11-02 DIAGNOSIS — N179 Acute kidney failure, unspecified: Secondary | ICD-10-CM | POA: Diagnosis not present

## 2023-11-02 DIAGNOSIS — I252 Old myocardial infarction: Secondary | ICD-10-CM | POA: Diagnosis not present

## 2023-11-02 DIAGNOSIS — Z515 Encounter for palliative care: Secondary | ICD-10-CM | POA: Diagnosis not present

## 2023-11-02 HISTORY — PX: PARTIAL COLECTOMY: SHX5273

## 2023-11-02 LAB — GLUCOSE, CAPILLARY
Glucose-Capillary: 229 mg/dL — ABNORMAL HIGH (ref 70–99)
Glucose-Capillary: 225 mg/dL — ABNORMAL HIGH (ref 70–99)
Glucose-Capillary: 240 mg/dL — ABNORMAL HIGH (ref 70–99)

## 2023-11-02 LAB — COMPREHENSIVE METABOLIC PANEL
ALT: 9 U/L (ref 0–44)
AST: 8 U/L — ABNORMAL LOW (ref 15–41)
Albumin: 2 g/dL — ABNORMAL LOW (ref 3.5–5.0)
Alkaline Phosphatase: 29 U/L — ABNORMAL LOW (ref 38–126)
Anion gap: 18 — ABNORMAL HIGH (ref 5–15)
BUN: 94 mg/dL — ABNORMAL HIGH (ref 8–23)
CO2: 17 mmol/L — ABNORMAL LOW (ref 22–32)
Calcium: 7.9 mg/dL — ABNORMAL LOW (ref 8.9–10.3)
Chloride: 97 mmol/L — ABNORMAL LOW (ref 98–111)
Creatinine, Ser: 9.2 mg/dL — ABNORMAL HIGH (ref 0.44–1.00)
GFR, Estimated: 4 mL/min — ABNORMAL LOW (ref >60)
Glucose, Bld: 212 mg/dL — ABNORMAL HIGH (ref 70–99)
Potassium: 4.2 mmol/L (ref 3.5–5.1)
Sodium: 132 mmol/L — ABNORMAL LOW (ref 135–145)
Total Bilirubin: 1.2 mg/dL (ref 0.0–1.2)
Total Protein: 6 g/dL — ABNORMAL LOW (ref 6.5–8.1)

## 2023-11-02 LAB — CBC
HCT: 26.5 % — ABNORMAL LOW (ref 36.0–46.0)
Hemoglobin: 8.5 g/dL — ABNORMAL LOW (ref 12.0–15.0)
MCH: 25.7 pg — ABNORMAL LOW (ref 26.0–34.0)
MCHC: 32.1 g/dL (ref 30.0–36.0)
MCV: 80.1 fL (ref 80.0–100.0)
Platelets: 373 10*3/uL (ref 150–400)
RBC: 3.31 MIL/uL — ABNORMAL LOW (ref 3.87–5.11)
RDW: 14.7 % (ref 11.5–15.5)
WBC: 16 10*3/uL — ABNORMAL HIGH (ref 4.0–10.5)
nRBC: 0 % (ref 0.0–0.2)

## 2023-11-02 LAB — HEPATITIS B SURFACE ANTIBODY, QUANTITATIVE: Hep B S AB Quant (Post): 4251 m[IU]/mL

## 2023-11-02 LAB — HEPATITIS B SURFACE ANTIGEN: Hepatitis B Surface Ag: NONREACTIVE

## 2023-11-02 SURGERY — COLECTOMY, PARTIAL
Anesthesia: General

## 2023-11-02 MED ORDER — HYDROMORPHONE HCL 1 MG/ML IJ SOLN
0.2500 mg | INTRAMUSCULAR | Status: DC | PRN
  Administered 2023-11-02: 0.5 mg via INTRAVENOUS

## 2023-11-02 MED ORDER — MIDAZOLAM HCL 2 MG/2ML IJ SOLN
INTRAMUSCULAR | Status: DC | PRN
  Administered 2023-11-02: 2 mg via INTRAVENOUS

## 2023-11-02 MED ORDER — LIDOCAINE 2% (20 MG/ML) 5 ML SYRINGE
INTRAMUSCULAR | Status: DC | PRN
  Administered 2023-11-02: 100 mg via INTRAVENOUS

## 2023-11-02 MED ORDER — CHLORHEXIDINE GLUCONATE 0.12 % MT SOLN: 15.0000 mL | Freq: Once | OROMUCOSAL | Status: AC

## 2023-11-02 MED ORDER — FENTANYL CITRATE (PF) 250 MCG/5ML IJ SOLN
INTRAMUSCULAR | Status: DC | PRN
  Administered 2023-11-02: 100 ug via INTRAVENOUS

## 2023-11-02 MED ORDER — ALBUMIN HUMAN 5 % IV SOLN: INTRAVENOUS | Status: DC | PRN

## 2023-11-02 MED ORDER — SUGAMMADEX SODIUM 200 MG/2ML IV SOLN
INTRAVENOUS | Status: DC | PRN
  Administered 2023-11-02: 200 mg via INTRAVENOUS

## 2023-11-02 MED ORDER — ORAL CARE MOUTH RINSE: 15.0000 mL | Freq: Once | OROMUCOSAL | Status: AC

## 2023-11-02 MED ORDER — ROCURONIUM BROMIDE 10 MG/ML (PF) SYRINGE
PREFILLED_SYRINGE | INTRAVENOUS | Status: AC
  Filled 2023-11-02: qty 10

## 2023-11-02 MED ORDER — ORAL CARE MOUTH RINSE: 15.0000 mL | OROMUCOSAL | Status: DC | PRN

## 2023-11-02 MED ORDER — SUCCINYLCHOLINE CHLORIDE 200 MG/10ML IV SOSY
PREFILLED_SYRINGE | INTRAVENOUS | Status: DC | PRN
  Administered 2023-11-02: 120 mg via INTRAVENOUS

## 2023-11-02 MED ORDER — ONDANSETRON HCL 4 MG/2ML IJ SOLN
INTRAMUSCULAR | Status: AC
  Filled 2023-11-02: qty 2

## 2023-11-02 MED ORDER — SODIUM CHLORIDE 0.9 % IV SOLN: INTRAVENOUS | Status: DC

## 2023-11-02 MED ORDER — HYDROMORPHONE HCL 1 MG/ML IJ SOLN
INTRAMUSCULAR | Status: AC
  Administered 2023-11-02: 0.5 mg via INTRAVENOUS
  Filled 2023-11-02: qty 1

## 2023-11-02 MED ORDER — INSULIN ASPART 100 UNIT/ML IJ SOLN: 0.0000 [IU] | INTRAMUSCULAR | Status: DC | PRN

## 2023-11-02 MED ORDER — PROPOFOL 10 MG/ML IV BOLUS
INTRAVENOUS | Status: AC
  Filled 2023-11-02: qty 20

## 2023-11-02 MED ORDER — FENTANYL CITRATE (PF) 250 MCG/5ML IJ SOLN
INTRAMUSCULAR | Status: AC
  Filled 2023-11-02: qty 5

## 2023-11-02 MED ORDER — PHENYLEPHRINE 80 MCG/ML (10ML) SYRINGE FOR IV PUSH (FOR BLOOD PRESSURE SUPPORT)
PREFILLED_SYRINGE | INTRAVENOUS | Status: AC
  Filled 2023-11-02: qty 10

## 2023-11-02 MED ORDER — MIDAZOLAM HCL 2 MG/2ML IJ SOLN
INTRAMUSCULAR | Status: AC
  Filled 2023-11-02: qty 2

## 2023-11-02 MED ORDER — ORAL CARE MOUTH RINSE
15.0000 mL | OROMUCOSAL | Status: DC
  Administered 2023-11-02 – 2023-11-25 (×67): 15 mL via OROMUCOSAL

## 2023-11-02 MED ORDER — HYDRALAZINE HCL 20 MG/ML IJ SOLN
10.0000 mg | Freq: Four times a day (QID) | INTRAMUSCULAR | Status: DC | PRN
  Administered 2023-11-05 – 2023-11-08 (×4): 10 mg via INTRAVENOUS
  Filled 2023-11-02 (×4): qty 1

## 2023-11-02 MED ORDER — 0.9 % SODIUM CHLORIDE (POUR BTL) OPTIME
TOPICAL | Status: DC | PRN
  Administered 2023-11-02: 4000 mL

## 2023-11-02 MED ORDER — ONDANSETRON HCL 4 MG/2ML IJ SOLN
INTRAMUSCULAR | Status: DC | PRN
  Administered 2023-11-02: 4 mg via INTRAVENOUS

## 2023-11-02 MED ORDER — PHENYLEPHRINE 80 MCG/ML (10ML) SYRINGE FOR IV PUSH (FOR BLOOD PRESSURE SUPPORT)
PREFILLED_SYRINGE | INTRAVENOUS | Status: DC | PRN
  Administered 2023-11-02 (×2): 80 ug via INTRAVENOUS

## 2023-11-02 MED ORDER — DEXAMETHASONE SODIUM PHOSPHATE 10 MG/ML IJ SOLN
INTRAMUSCULAR | Status: AC
  Filled 2023-11-02: qty 1

## 2023-11-02 MED ORDER — DROPERIDOL 2.5 MG/ML IJ SOLN
INTRAMUSCULAR | Status: AC
  Filled 2023-11-02: qty 2

## 2023-11-02 MED ORDER — PHENYLEPHRINE HCL-NACL 20-0.9 MG/250ML-% IV SOLN
INTRAVENOUS | Status: DC | PRN
  Administered 2023-11-02: 50 ug/min via INTRAVENOUS

## 2023-11-02 MED ORDER — LIDOCAINE 2% (20 MG/ML) 5 ML SYRINGE
INTRAMUSCULAR | Status: AC
  Filled 2023-11-02: qty 5

## 2023-11-02 MED ORDER — PROPOFOL 10 MG/ML IV BOLUS
INTRAVENOUS | Status: DC | PRN
  Administered 2023-11-02: 110 mg via INTRAVENOUS

## 2023-11-02 MED ORDER — PHENYLEPHRINE HCL (PRESSORS) 10 MG/ML IV SOLN: INTRAVENOUS | Status: DC | PRN

## 2023-11-02 MED ORDER — SODIUM CHLORIDE 0.9 % IV SOLN
INTRAVENOUS | Status: DC
Start: 2023-11-02 — End: 2023-11-02

## 2023-11-02 MED ORDER — LACTATED RINGERS IV SOLN: INTRAVENOUS | Status: AC

## 2023-11-02 MED ORDER — CHLORHEXIDINE GLUCONATE 0.12 % MT SOLN
OROMUCOSAL | Status: AC
  Administered 2023-11-02: 15 mL via OROMUCOSAL
  Filled 2023-11-02: qty 15

## 2023-11-02 MED ORDER — HEPARIN SODIUM (PORCINE) 1000 UNIT/ML IJ SOLN
2800.0000 [IU] | Freq: Once | INTRAMUSCULAR | Status: AC
  Administered 2023-11-02: 2800 [IU]

## 2023-11-02 MED ORDER — DROPERIDOL 2.5 MG/ML IJ SOLN
0.6250 mg | Freq: Once | INTRAMUSCULAR | Status: AC | PRN
  Administered 2023-11-02: 0.625 mg via INTRAVENOUS

## 2023-11-02 MED ORDER — ROCURONIUM BROMIDE 10 MG/ML (PF) SYRINGE
PREFILLED_SYRINGE | INTRAVENOUS | Status: DC | PRN
  Administered 2023-11-02: 50 mg via INTRAVENOUS

## 2023-11-02 SURGICAL SUPPLY — 43 items
BAG COUNTER SPONGE SURGICOUNT (BAG) ×1 IMPLANT
BNDG GAUZE DERMACEA FLUFF 4 (GAUZE/BANDAGES/DRESSINGS) IMPLANT
CANISTER SUCT 3000ML PPV (MISCELLANEOUS) ×1 IMPLANT
CNTNR URN SCR LID CUP LEK RST (MISCELLANEOUS) IMPLANT
COVER SURGICAL LIGHT HANDLE (MISCELLANEOUS) ×1 IMPLANT
DRAPE INCISE IOBAN 66X45 STRL (DRAPES) IMPLANT
DRAPE LAPAROTOMY 100X72X124 (DRAPES) IMPLANT
DRAPE WARM FLUID 44X44 (DRAPES) IMPLANT
ELECT CAUTERY BLADE 6.4 (BLADE) ×1 IMPLANT
ELECT REM PT RETURN 9FT ADLT (ELECTROSURGICAL) ×1 IMPLANT
ELECTRODE REM PT RTRN 9FT ADLT (ELECTROSURGICAL) ×1 IMPLANT
GAUZE PAD ABD 8X10 STRL (GAUZE/BANDAGES/DRESSINGS) IMPLANT
GLOVE BIOGEL PI IND STRL 6 (GLOVE) IMPLANT
GLOVE BIOGEL PI MICRO STRL 5.5 (GLOVE) IMPLANT
GOWN STRL REIN 3XL LVL4 (GOWN DISPOSABLE) IMPLANT
GOWN STRL REUS W/ TWL LRG LVL3 (GOWN DISPOSABLE) ×4 IMPLANT
GOWN STRL REUS W/ TWL XL LVL3 (GOWN DISPOSABLE) ×2 IMPLANT
HANDLE SUCTION POOLE (INSTRUMENTS) IMPLANT
KIT OSTOMY DRAINABLE 2.75 STR (WOUND CARE) IMPLANT
KIT TURNOVER KIT B (KITS) ×1 IMPLANT
LIGASURE IMPACT 36 18CM CVD LR (INSTRUMENTS) ×1 IMPLANT
NS IRRIG 1000ML POUR BTL (IV SOLUTION) ×2 IMPLANT
PACK GENERAL/GYN (CUSTOM PROCEDURE TRAY) IMPLANT
PAD ARMBOARD 7.5X6 YLW CONV (MISCELLANEOUS) ×2 IMPLANT
RELOAD BL CONTOUR (ENDOMECHANICALS) ×2 IMPLANT
RELOAD STAPLE 40 BLU REG (ENDOMECHANICALS) IMPLANT
RETRACTOR WOUND ALXS 34CM XLRG (MISCELLANEOUS) IMPLANT
RTRCTR WOUND ALEXIS 34CM XLRG (MISCELLANEOUS) ×1 IMPLANT
SEPRAFILM PROCEDURAL PACK 3X5 (MISCELLANEOUS) IMPLANT
SPONGE T-LAP 18X18 ~~LOC~~+RFID (SPONGE) IMPLANT
STAPLER CVD CUT BL 40 RELOAD (ENDOMECHANICALS) ×1 IMPLANT
STAPLER CVD CUT BLU 40 RELOAD (ENDOMECHANICALS) IMPLANT
STAPLER SKIN PROX WIDE 3.9 (STAPLE) IMPLANT
SUCTION POOLE HANDLE (INSTRUMENTS) ×1 IMPLANT
SUT PDS AB 1 TP1 96 (SUTURE) ×2 IMPLANT
SUT PROLENE 2 0 CT 30 (SUTURE) IMPLANT
SUT SILK 2 0 SH CR/8 (SUTURE) IMPLANT
SUT SILK 2 0 TIES 10X30 (SUTURE) ×1 IMPLANT
SUT SILK 3 0 SH CR/8 (SUTURE) IMPLANT
SUT SILK 3 0 TIES 10X30 (SUTURE) ×1 IMPLANT
SUT VIC AB 3-0 SH 8-18 (SUTURE) IMPLANT
TRAY FOLEY MTR SLVR 14FR STAT (SET/KITS/TRAYS/PACK) ×1 IMPLANT
UNDERPAD 30X36 HEAVY ABSORB (UNDERPADS AND DIAPERS) IMPLANT

## 2023-11-02 NOTE — Progress Notes (Signed)
Patient back to room from PACU.

## 2023-11-02 NOTE — Progress Notes (Signed)
 Patient off floor to OR

## 2023-11-02 NOTE — Progress Notes (Signed)
Patient off floor to dialysis

## 2023-11-02 NOTE — Progress Notes (Signed)
 OT Cancellation Note  Patient Details Name: [name] MRN: [id number] DOB: 1956/02/10   Cancelled Treatment:    Reason Eval/Treat Not Completed: Patient at procedure or test/ unavailable Pt is off the unit at sx for colectomy/colostomy. Will return tomorrow as time allows and pt is appropriate.   [facility] OTR/L Acute Rehabilitation Services Office: [phone]   [name] 11/02/2023, 9:59 AM

## 2023-11-02 NOTE — Anesthesia Postprocedure Evaluation (Signed)
 Anesthesia Post Note  Patient: [name]  Procedure(s) Performed: OPEN PARTIAL COLECTOMY WITH END COLOSTOMY     Patient location during evaluation: PACU Anesthesia Type: General Level of consciousness: sedated and patient cooperative Pain management: pain level controlled Vital Signs Assessment: post-procedure vital signs reviewed and stable Respiratory status: spontaneous breathing Cardiovascular status: stable Anesthetic complications: no   There were no known notable events for this encounter.  Last Vitals:  Vitals:   11/02/23 2030 11/02/23 2100  BP: (!) 109/53 (!) 107/51  Pulse: 74 75  Resp: 15 14  Temp:    SpO2: 100% 100%    Last Pain:  Vitals:   11/02/23 1830  TempSrc: Oral  PainSc:                  Norleen Pope

## 2023-11-02 NOTE — Anesthesia Preprocedure Evaluation (Addendum)
 Anesthesia Evaluation  Patient identified by MRN, date of birth, ID band Patient awake    Reviewed: Allergy & Precautions, NPO status , Patient's Chart, lab work & pertinent test results  Airway Mallampati: III  TM Distance: >3 FB Neck ROM: Full    Dental  (+) Dental Advisory Given, Missing, Chipped   Pulmonary neg pulmonary ROS   breath sounds clear to auscultation       Cardiovascular hypertension, Pt. on medications + CAD and + Past MI  + Valvular Problems/Murmurs  Rhythm:Regular Rate:Normal + Systolic murmurs Stress MPS 04/2023 Left ventricular function is normal. Nuclear stress EF: 66%. The left ventricular ejection fraction is hyperdynamic (>65%). End diastolic cavity size is normal. End systolic cavity size is normal. No evidence of transient ischemic dilation (TID) noted.  \The study is normal. The study is low risk.    Echo 03/2023  1. Left ventricular ejection fraction, by estimation, is 60 to 65%. The left ventricle has normal function. The left ventricle has no regional wall motion abnormalities. There is mild left ventricular hypertrophy. Left ventricular diastolic parameters are consistent with Grade I diastolic dysfunction (impaired relaxation).   2. Right ventricular systolic function is normal. The right ventricular size is normal. There is normal pulmonary artery systolic pressure.   3. Left atrial size was mildly dilated.   4. No evidence of mitral valve regurgitation.   5. The aortic valve is tricuspid. Aortic valve regurgitation is not visualized.   6. The inferior vena cava is normal in size with greater than 50% respiratory variability, suggesting right atrial pressure of 3 mmHg.     Neuro/Psych  Headaches    GI/Hepatic negative GI ROS, Neg liver ROS,,,  Endo/Other  diabetes    Renal/GU Renal Insufficiency and ESRFRenal disease     Musculoskeletal  (+) Arthritis ,    Abdominal  (+) + obese  Peds   Hematology  (+) Blood dyscrasia, anemia   Anesthesia Other Findings   Reproductive/Obstetrics                             Anesthesia Physical Anesthesia Plan  ASA: 3  Anesthesia Plan: General   Post-op Pain Management: Ofirmev  IV (intra-op)*   Induction: Intravenous  PONV Risk Score and Plan: 4 or greater and Ondansetron , Dexamethasone , Treatment may vary due to age or medical condition and Midazolam   Airway Management Planned: Oral ETT  Additional Equipment:   Intra-op Plan:   Post-operative Plan: Possible Post-op intubation/ventilation  Informed Consent: I have reviewed the patients History and Physical, chart, labs and discussed the procedure including the risks, benefits and alternatives for the proposed anesthesia with the patient or authorized representative who has indicated his/her understanding and acceptance.     Dental advisory given  Plan Discussed with: CRNA  Anesthesia Plan Comments:         Anesthesia Quick Evaluation

## 2023-11-02 NOTE — Progress Notes (Signed)
      Attempted to see patient today. Patient at procedure or test/ unavailable. Pt is off the unit for colectomy/colostomy. Will return tomorrow as time allows.    Thank you for your referral and allowing [facility] to assist in [name]'s care.   Stephane Palin, NP Palliative Medicine Team  Team Phone # [phone]   NO CHARGE

## 2023-11-02 NOTE — Transfer of Care (Signed)
 Immediate Anesthesia Transfer of Care Note  Patient: [name]  Procedure(s) Performed: OPEN PARTIAL COLECTOMY WITH END COLOSTOMY  Patient Location: PACU  Anesthesia Type:General  Level of Consciousness: alert  and patient cooperative  Airway & Oxygen Therapy: Patient Spontanous Breathing and Patient connected to face mask oxygen  Post-op Assessment: Report given to RN, Post -op Vital signs reviewed and stable, and Patient moving all extremities X 4  Post vital signs: Reviewed and stable  Last Vitals:  Vitals Value Taken Time  BP 131/65 11/02/23 1230  Temp    Pulse 76 11/02/23 1239  Resp 27 11/02/23 1239  SpO2 93 % 11/02/23 1239  Vitals shown include unfiled device data.  Last Pain:  Vitals:   11/02/23 0700  TempSrc:   PainSc: Asleep      Patients Stated Pain Goal: 0 (10/30/23 2022)  Complications: There were no known notable events for this encounter.

## 2023-11-02 NOTE — Anesthesia Procedure Notes (Signed)
 Procedure Name: Intubation Date/Time: 11/02/2023 10:18 AM  Performed by: Tressie Gilmore RAMAN, CRNAPre-anesthesia Checklist: Patient identified, Emergency Drugs available, Suction available and Patient being monitored Patient Re-evaluated:Patient Re-evaluated prior to induction Oxygen Delivery Method: Circle System Utilized Preoxygenation: Pre-oxygenation with 100% oxygen Induction Type: IV induction Ventilation: Mask ventilation without difficulty Laryngoscope Size: Mac and 3 Grade View: Grade II Tube type: Oral Tube size: 7.0 mm Number of attempts: 1 Airway Equipment and Method: Stylet and Oral airway Placement Confirmation: ETT inserted through vocal cords under direct vision, positive ETCO2 and breath sounds checked- equal and bilateral Secured at: 20 cm Tube secured with: Tape Dental Injury: Teeth and Oropharynx as per pre-operative assessment

## 2023-11-02 NOTE — Progress Notes (Signed)
 Received patient in bed to unit.  Alert and oriented.  Informed consent signed and in chart.   TX duration:2.5hours  Patient tolerated well.  Transported back to the room  Alert, without acute distress.  Hand-off given to patient's nurse.   Access used: dialysis cath Access issues: venous no blood return flushes well with out resistance  Total UF removed: Medication(s) given: 1.4 units heplock Post HD VS: see table below Post HD weight: 98.3kg   11/02/23 2117  Vitals  Temp (!) 97.5 F (36.4 C)  Temp Source Oral  BP (!) 110/52  MAP (mmHg) 70  BP Location Left Arm  BP Method Automatic  Patient Position (if appropriate) Lying  Pulse Rate 83  Pulse Rate Source Monitor  ECG Heart Rate 77  Resp 14  Oxygen Therapy  SpO2 100 %  O2 Device Nasal Cannula  O2 Flow Rate (L/min) 2 L/min  Patient Activity (if Appropriate) In bed  Pulse Oximetry Type Continuous  During Treatment Monitoring  Blood Flow Rate (mL/min) 0 mL/min  Arterial Pressure (mmHg) -1.61 mmHg  Venous Pressure (mmHg) -2.22 mmHg  TMP (mmHg) 17.77 mmHg  Ultrafiltration Rate (mL/min) 408 mL/min  Dialysate Flow Rate (mL/min) 299 ml/min  Dialysate Potassium Concentration 2  Dialysate Calcium  Concentration 2.5  Duration of HD Treatment -hour(s) 2.5 hour(s)  Cumulative Fluid Removed (mL) per Treatment  500.07  HD Safety Checks Performed Yes  Intra-Hemodialysis Comments Tolerated well  Post Treatment  Dialyzer Clearance Lightly streaked  Hemodialysis Intake (mL) 0 mL  Liters Processed 37.5  Fluid Removed (mL) 500 mL  Tolerated HD Treatment Yes  Post-Hemodialysis Comments goal met  Hemodialysis Catheter Right Internal jugular Triple lumen Temporary (Non-Tunneled)  Placement Date/Time: 11/01/23 1617   Serial / Lot #: [id number]  Expiration Date: 03/22/28  Time Out: Correct patient;Correct site;Correct procedure  Maximum sterile barrier precautions: Hand hygiene;Cap;Mask;Sterile gown;Sterile gloves;Large  sterile ...  Site Condition No complications  Blue Lumen Status Flushed;Heparin  locked;Dead end cap in place  Red Lumen Status Flushed;Heparin  locked;Dead end cap in place  Catheter fill solution Heparin  1000 units/ml  Catheter fill volume (Arterial) 1.4 cc  Catheter fill volume (Venous) 1.4      Lorrene KANDICE [facility]

## 2023-11-02 NOTE — Progress Notes (Signed)
 PROGRESS NOTE        PATIENT DETAILS Name: [name] Age: 68 y.o. Sex: female Date of Birth: 1956-03-01 Admit Date: 10/27/2023 Admitting Physician Dorn Dawson, MD ERE:Duntz, Damian, NP  Brief Summary: Patient is a 68 y.o.  female with history of CKD 5, A-fib on Eliquis , CAD-who presented with abdominal pain-found to have acute diverticulitis with contained perforation involving the descending colon.  Hospital course complicated by worsening AKI-thought to have progressed to ESRD-requiring initiation of HD.  See below for further details.  Significant events: 2/2>> admit to [facility] 2/6>> CT abdomen worsening diverticulitis with perforation 2/7>> transitioned to full comfort measures-as with worsening AKI-however multiple family members arrived-comfort care revoked-now full code-full scope of treatment including HD/laparotomy with colostomy.  General surgery/nephrology reconsulted.  IR placed [facility] and patient underwent first hemodialysis.  2/8>> remains n.p.o-colectomy/colostomy later today.  Significant studies: 2/2>> CT abdomen/pelvis: Acute diverticulitis-descending colon with contained perforation 2/6>> CT abdomen/pelvis: Worsening diverticulitis of descending colon with perforation and increasing failure-suspected oral contrast extravasation left abdomen.  Significant microbiology data: 2/2>> COVID/influenza/RSV PCR: Negative  Procedures: 2/7>>  Consults: Surgery Renal Palliative IR  Subjective: Uneventful night-tolerated HD reasonably well-no complaints-continues to have worsening lower abdominal pain.  Going to the operating room later today.  Objective: Vitals: Blood pressure 123/66, pulse 78, temperature 98 F (36.7 C), resp. rate (!) 41, height 5' 7 (1.702 m), weight 100.5 kg, SpO2 97%.   Exam: Gen Exam:Alert awake-not in any distress HEENT:atraumatic, normocephalic Chest: B/L clear to auscultation anteriorly CVS:S1S2  regular Abdomen:soft-continues to be tender in the lower abdomen. Extremities:no edema Neurology: Non focal Skin: no rash  Pertinent Labs/Radiology:    Latest Ref Rng & Units 11/02/2023    1:33 AM 11/01/2023    5:32 AM 10/31/2023    5:03 AM  CBC  WBC 4.0 - 10.5 K/uL 16.0  12.0  13.7   Hemoglobin 12.0 - 15.0 g/dL 8.5  9.0  7.4   Hematocrit 36.0 - 46.0 % 26.5  27.6  22.7   Platelets 150 - 400 K/uL 373  384  337     Lab Results  Component Value Date   NA 132 (L) 11/02/2023   K 4.2 11/02/2023   CL 97 (L) 11/02/2023   CO2 17 (L) 11/02/2023      Assessment/Plan: Acute diverticulitis with contained perforation of the descending colon Managed with Zosyn /supportive measures-unfortunately continue to worsen-plan is for colectomy/ostomy today.  General surgery following.  AKI on CKD 5 with progression to ESRD AKI likely hemodynamically mediated-has now likely progressed to ESRD. Initially patient did not want to pursue HD-however on 2/7-renal function markedly worsened-she was more uremic-she was then transitioned to full comfort measures-however after arrival of multiple family members from out of town-comfort measures were revoked-patient changed her mind and wanted full scope of treatment including hemodialysis.  Patient/family well aware-that HD is likely not going be short-term-and that she has progressed to ESRD and she probably will be dialysis dependent indefinitely. TDC catheter placed on 2/7-underwent first HD 2/7 Nephrology following-await further recommendations.  CAD Currently no anginal symptoms Suspect not on antiplatelets as on anticoagulation with Eliquis  Continue beta-blocker On Repatha injections as an outpatient.  PAF Sinus rhythm Continue Coreg  Resume IV heparin  when okay with surgery-Eliquis  obviously on hold.  HTN BP stable-continue Coreg /amlodipine .  DM-2 (A1c 6.6 on 2/3) CBGs relatively stable on SSI-follow/optimize  as diet get started back on.  Recent  Labs    11/01/23 1151 11/01/23 1711 11/02/23 0736  GLUCAP 230* 205* 229*    Palliative care/goals of care Initially DNR-multiple discussions were held by this MD/nephrology and the palliative care team-patient initially was very adamant on not pursuing hemodialysis-even if it was lifesaving.  On 2/7-her overall condition markedly worsen-she was more uremic-she was then transition to full comfort measures-however multiple family members arrived out of time-and upon further discussion with family-comfort care was revoked-full code-full scope of treatment including hemodialysis and colectomy/colostomy.  Follow clinical course.  Class 1 Obesity: Estimated body mass index is 34.7 kg/m as calculated from the following:   Height as of this encounter: 5' 7 (1.702 m).   Weight as of this encounter: 100.5 kg.   Palliative care/goals of care DNR in place Please see prior notes-patient has been very firm and not wishing to pursue hemodialysis-even in a life-threatening situation-her clinical situation/renal function is much worse this morning-plan is to transition to full comfort measures.  Have asked social work to see if we can get her to residential hospice.  I have updated the patient's son [name] on 2/7.  Given rapidly worsening renal failure/uremia/worsening diverticulitis-life expectancy is expected to be less than 2 weeks.    Code status:   Code Status: Full Code   DVT Prophylaxis: SCD's Start: 11/02/23 0804 IV Heparin   Family Communication: Anita [phone] updated 2/7   Disposition Plan: Status is: Inpatient Remains inpatient appropriate because: Severity of illness   Planned Discharge Destination:Residential hospice   Diet: Diet Order             Diet NPO time specified Except for: Sips with Meds  Diet effective now                     Antimicrobial agents: Anti-infectives (From admission, onward)    Start     Dose/Rate Route Frequency Ordered Stop    11/01/23 1415  [MAR Hold]  piperacillin -tazobactam (ZOSYN ) IVPB 2.25 g        (MAR Hold since Sat 11/02/2023 at 0834.Hold Reason: Transfer to a Procedural area)   2.25 g 100 mL/hr over 30 Minutes Intravenous Every 8 hours 11/01/23 1316     10/29/23 1100  piperacillin -tazobactam (ZOSYN ) IVPB 2.25 g  Status:  Discontinued        2.25 g 100 mL/hr over 30 Minutes Intravenous Every 8 hours 10/29/23 1006 11/01/23 1016   10/28/23 0600  piperacillin -tazobactam (ZOSYN ) IVPB 2.25 g  Status:  Discontinued        2.25 g 100 mL/hr over 30 Minutes Intravenous Every 8 hours 10/27/23 2224 10/27/23 2235   10/28/23 0600  ceFEPIme  (MAXIPIME ) 1 g in sodium chloride  0.9 % 100 mL IVPB  Status:  Discontinued        1 g 200 mL/hr over 30 Minutes Intravenous Every 24 hours 10/27/23 2235 10/29/23 0954   10/28/23 0600  metroNIDAZOLE  (FLAGYL ) IVPB 500 mg  Status:  Discontinued        500 mg 100 mL/hr over 60 Minutes Intravenous Every 12 hours 10/27/23 2235 10/29/23 0954   10/27/23 2130  piperacillin -tazobactam (ZOSYN ) IVPB 3.375 g        3.375 g 100 mL/hr over 30 Minutes Intravenous  Once 10/27/23 2128 10/27/23 2320        MEDICATIONS: Scheduled Meds:  [MAR Hold] amLODipine   5 mg Oral Daily   [MAR Hold] carvedilol   6.25 mg Oral BID WC  chlorhexidine   15 mL Mouth/Throat Once   Or   mouth rinse  15 mL Mouth Rinse Once   chlorhexidine        [MAR Hold] Chlorhexidine  Gluconate Cloth  6 each Topical Q0600   [MAR Hold] insulin  aspart  0-6 Units Subcutaneous TID WC   [MAR Hold] lidocaine -EPINEPHrine  (PF)  10 mL Infiltration Once   [MAR Hold] sodium bicarbonate   650 mg Oral TID   [MAR Hold] sodium chloride  flush  10-40 mL Intracatheter Q12H   [MAR Hold] sodium chloride  flush  3 mL Intravenous Q12H   Continuous Infusions:  [MAR Hold] sodium chloride      sodium chloride      [MAR Hold] piperacillin -tazobactam (ZOSYN )  IV 2.25 g (11/02/23 0624)   PRN Meds:.[MAR Hold] sodium chloride , [MAR Hold] acetaminophen  **OR**  [MAR Hold] acetaminophen , [MAR Hold] albuterol , chlorhexidine , [MAR Hold]  HYDROmorphone  (DILAUDID ) injection, insulin  aspart, [MAR Hold] ondansetron  (ZOFRAN ) IV, [MAR Hold] polyethylene glycol, [MAR Hold] sodium chloride  flush, [MAR Hold] sodium chloride  flush   I have personally reviewed following labs and imaging studies  LABORATORY DATA: CBC: Recent Labs  Lab 10/27/23 1936 10/28/23 0522 10/29/23 0725 10/30/23 0501 10/31/23 0503 11/01/23 0532 11/02/23 0133  WBC 16.8*   < > 17.9* 17.3* 13.7* 12.0* 16.0*  NEUTROABS 15.2*  --   --   --   --   --   --   HGB 9.8*   < > 8.3* 7.9* 7.4* 9.0* 8.5*  HCT 31.5*   < > 26.4* 24.6* 22.7* 27.6* 26.5*  MCV 82.9   < > 82.2 81.2 79.9* 79.8* 80.1  PLT 435*   < > 357 356 337 384 373   < > = values in this interval not displayed.    Basic Metabolic Panel: Recent Labs  Lab 10/28/23 0522 10/29/23 0725 10/30/23 0501 10/31/23 0503 11/01/23 0532 11/02/23 0133  NA 134* 138 135 134* 129* 132*  K 4.2 4.4 4.4 4.1 4.6 4.2  CL 102 103 102 102 97* 97*  CO2 18* 19* 18* 18* 14* 17*  GLUCOSE 267* 178* 209* 152* 236* 212*  BUN 76* 95* 101* 100* 106* 94*  CREATININE 7.33* 8.43* 9.26* 9.58* 10.48* 9.20*  CALCIUM  8.0* 8.0* 7.9* 7.6* 7.6* 7.9*  MG 2.2  --   --   --   --   --   PHOS 6.7* 7.1* 6.8* 6.1* 7.6*  --     GFR: Estimated Creatinine Clearance: 7.1 mL/min (A) (by C-G formula based on SCr of 9.2 mg/dL (H)).  Liver Function Tests: Recent Labs  Lab 10/27/23 1936 10/29/23 0725 10/30/23 0501 10/31/23 0503 11/01/23 0532 11/02/23 0133  AST 17  --   --   --   --  8*  ALT 9  --   --   --   --  9  ALKPHOS 44  --   --   --   --  29*  BILITOT 1.0  --   --   --   --  1.2  PROT 7.3  --   --   --   --  6.0*  ALBUMIN  3.4* 2.8* 2.6* 2.4* 2.2* 2.0*   Recent Labs  Lab 10/27/23 1936  LIPASE 26   No results for input(s): AMMONIA in the last 168 hours.  Coagulation Profile: Recent Labs  Lab 10/28/23 0522  INR >10.0*    Cardiac Enzymes: No  results for input(s): CKTOTAL, CKMB, CKMBINDEX, TROPONINI in the last 168 hours.  BNP (last 3 results) No results for  input(s): PROBNP in the last 8760 hours.  Lipid Profile: No results for input(s): CHOL, HDL, LDLCALC, TRIG, CHOLHDL, LDLDIRECT in the last 72 hours.  Thyroid  Function Tests: No results for input(s): TSH, T4TOTAL, FREET4, T3FREE, THYROIDAB in the last 72 hours.  Anemia Panel: Recent Labs    10/30/23 1427  FERRITIN 140  TIBC 181*  IRON  12*    Urine analysis:    Component Value Date/Time   COLORURINE YELLOW 10/28/2023 0416   APPEARANCEUR HAZY (A) 10/28/2023 0416   LABSPEC 1.010 10/28/2023 0416   PHURINE 7.0 10/28/2023 0416   GLUCOSEU 150 (A) 10/28/2023 0416   GLUCOSEU NEGATIVE 05/05/2021 1228   HGBUR NEGATIVE 10/28/2023 0416   BILIRUBINUR NEGATIVE 10/28/2023 0416   KETONESUR NEGATIVE 10/28/2023 0416   PROTEINUR >=300 (A) 10/28/2023 0416   UROBILINOGEN 0.2 05/05/2021 1228   NITRITE NEGATIVE 10/28/2023 0416   LEUKOCYTESUR MODERATE (A) 10/28/2023 0416    Sepsis Labs: Lactic Acid, Venous    Component Value Date/Time   LATICACIDVEN 1.0 10/27/2023 2240    MICROBIOLOGY: Recent Results (from the past 240 hours)  Resp panel by RT-PCR (RSV, Flu A&B, Covid) Anterior Nasal Swab     Status: None   Collection Time: 10/27/23  7:18 PM   Specimen: Anterior Nasal Swab  Result Value Ref Range Status   SARS Coronavirus 2 by RT PCR NEGATIVE NEGATIVE Final   Influenza A by PCR NEGATIVE NEGATIVE Final   Influenza B by PCR NEGATIVE NEGATIVE Final    Comment: (NOTE) The Xpert Xpress SARS-CoV-2/FLU/RSV plus assay is intended as an aid in the diagnosis of influenza from Nasopharyngeal swab specimens and should not be used as a sole basis for treatment. Nasal washings and aspirates are unacceptable for Xpert Xpress SARS-CoV-2/FLU/RSV testing.  Fact Sheet for Patients: bloggercourse.com  Fact Sheet for Healthcare  Providers: seriousbroker.it  This test is not yet approved or cleared by the [place]  FDA and has been authorized for detection and/or diagnosis of SARS-CoV-2 by FDA under an Emergency Use Authorization (EUA). This EUA will remain in effect (meaning this test can be used) for the duration of the COVID-19 declaration under Section 564(b)(1) of the Act, 21 U.S.C. section 360bbb-3(b)(1), unless the authorization is terminated or revoked.     Resp Syncytial Virus by PCR NEGATIVE NEGATIVE Final    Comment: (NOTE) Fact Sheet for Patients: bloggercourse.com  Fact Sheet for Healthcare Providers: seriousbroker.it  This test is not yet approved or cleared by the [place]  FDA and has been authorized for detection and/or diagnosis of SARS-CoV-2 by FDA under an Emergency Use Authorization (EUA). This EUA will remain in effect (meaning this test can be used) for the duration of the COVID-19 declaration under Section 564(b)(1) of the Act, 21 U.S.C. section 360bbb-3(b)(1), unless the authorization is terminated or revoked.  Performed at [facility], [address] [place], [place] [address]     RADIOLOGY STUDIES/RESULTS: IR Fluoro Guide CV Line Right Result Date: 11/01/2023 INDICATION: ESRD requiring HD. EXAM: NON-TUNNELED CENTRAL VENOUS HEMODIALYSIS CATHETER PLACEMENT WITH ULTRASOUND AND FLUOROSCOPIC GUIDANCE COMPARISON:  CT AP 10/31/2023. MEDICATIONS: Local anesthetic was administered. FLUOROSCOPY TIME:  Fluoroscopic dose; 35 mGy COMPLICATIONS: None immediate. PROCEDURE: Informed written consent was obtained from the patient and/or patient's representative after a discussion of the risks, benefits, and alternatives to treatment. Questions regarding the procedure were encouraged and answered. The RIGHT neck and chest were prepped with chlorhexidine  in a sterile fashion, and a sterile drape was applied  covering the operative field. Maximum  barrier sterile technique with sterile gowns and gloves were used for the procedure. A timeout was performed prior to the initiation of the procedure. After the overlying soft tissues were anesthetized, a small venotomy incision was created and a micropuncture kit was utilized to access the external jugular vein. Real-time ultrasound guidance was utilized for vascular access including the acquisition of a permanent ultrasound image documenting patency of the accessed vessel. The microwire was utilized to measure appropriate catheter length. A stiff glidewire was advanced to the level of the IVC. Under fluoroscopic guidance, the venotomy was serially dilated, ultimately allowing placement of a 20 cm temporary Trialysis catheter with tip ultimately terminating within the superior aspect of the right atrium. Final catheter positioning was confirmed and documented with a spot radiographic image. The catheter aspirates and flushes normally. The catheter was flushed with appropriate volume heparin  dwells. The catheter exit site was secured with a 2-0 Ethilon retention suture. A dressing was placed. The patient tolerated the procedure well without immediate post procedural complication. IMPRESSION: Successful placement of a RIGHT EXTERNAL jugular approach 20 cm non-tunneled dialysis catheter The tip of the catheter is positioned within the proximal RIGHT atrium. The catheter is ready for immediate use. PLAN: This catheter may be converted to a tunneled dialysis catheter at a later date as indicated. Thom Hall, MD [facility] Electronically Signed   By: Thom Hall M.D.   On: 11/01/2023 18:15   IR US  Guide Vasc Access Right Result Date: 11/01/2023 INDICATION: ESRD requiring HD. EXAM: NON-TUNNELED CENTRAL VENOUS HEMODIALYSIS CATHETER PLACEMENT WITH ULTRASOUND AND FLUOROSCOPIC GUIDANCE COMPARISON:  CT AP 10/31/2023.  MEDICATIONS: Local anesthetic was administered. FLUOROSCOPY TIME:  Fluoroscopic dose; 35 mGy COMPLICATIONS: None immediate. PROCEDURE: Informed written consent was obtained from the patient and/or patient's representative after a discussion of the risks, benefits, and alternatives to treatment. Questions regarding the procedure were encouraged and answered. The RIGHT neck and chest were prepped with chlorhexidine  in a sterile fashion, and a sterile drape was applied covering the operative field. Maximum barrier sterile technique with sterile gowns and gloves were used for the procedure. A timeout was performed prior to the initiation of the procedure. After the overlying soft tissues were anesthetized, a small venotomy incision was created and a micropuncture kit was utilized to access the external jugular vein. Real-time ultrasound guidance was utilized for vascular access including the acquisition of a permanent ultrasound image documenting patency of the accessed vessel. The microwire was utilized to measure appropriate catheter length. A stiff glidewire was advanced to the level of the IVC. Under fluoroscopic guidance, the venotomy was serially dilated, ultimately allowing placement of a 20 cm temporary Trialysis catheter with tip ultimately terminating within the superior aspect of the right atrium. Final catheter positioning was confirmed and documented with a spot radiographic image. The catheter aspirates and flushes normally. The catheter was flushed with appropriate volume heparin  dwells. The catheter exit site was secured with a 2-0 Ethilon retention suture. A dressing was placed. The patient tolerated the procedure well without immediate post procedural complication. IMPRESSION: Successful placement of a RIGHT EXTERNAL jugular approach 20 cm non-tunneled dialysis catheter The tip of the catheter is positioned within the proximal RIGHT atrium. The catheter is ready for immediate use. PLAN: This catheter  may be converted to a tunneled dialysis catheter at a later date as indicated. Thom Hall, MD [facility] Rad[facility] Electronically Signed   By: Thom Hall M.D.   On:  11/01/2023 18:15   CT ABDOMEN PELVIS WO CONTRAST Result Date: 10/31/2023 CLINICAL DATA:  Diverticulitis, complication suspected. EXAM: CT ABDOMEN AND PELVIS WITHOUT CONTRAST TECHNIQUE: Multidetector CT imaging of the abdomen and pelvis was performed following the standard protocol without IV contrast. RADIATION DOSE REDUCTION: This exam was performed according to the departmental dose-optimization program which includes automated exposure control, adjustment of the mA and/or kV according to patient size and/or use of iterative reconstruction technique. COMPARISON:  10/27/2023. FINDINGS: Lower chest: Heart is enlarged and multi-vessel coronary artery calcifications are noted. Consolidation and patchy airspace disease is present at the lung bases bilaterally. Hepatobiliary: No focal liver abnormality is seen. Stones are present within the gallbladder. No biliary ductal dilatation. Pancreas: Unremarkable. No pancreatic ductal dilatation or surrounding inflammatory changes. Spleen: Normal in size without focal abnormality. Adrenals/Urinary Tract: The adrenal glands are within normal limits. Calcifications are present in the upper pole of the left kidney. There is a cyst in the lower pole of the right kidney. No hydronephrosis bilaterally. The bladder is unremarkable. Stomach/Bowel: The stomach is within normal limits. No bowel obstruction or pneumatosis is seen. Right hemicolectomy changes are noted. There is diffuse colonic wall thickening. Scattered diverticula are present along the colon. A few small foci of free air are noted in the mesentery in the mid left abdomen, slightly increased from the previous exam. There is suggestion of contrast extravasation in the region. There is fine no definite  abscess is seen. Vascular/Lymphatic: Aortic atherosclerosis. No enlarged abdominal or pelvic lymph nodes. Reproductive: No acute abnormality. Other: Mild ascites is noted in all 4 quadrants. A fat containing umbilical hernia is noted. There is mild anasarca. Musculoskeletal: The bony structures are stable. IMPRESSION: 1. Worsening diverticulitis of the descending colon with perforation and increasing free air and suspected oral contrast extravasation in the mid left abdomen. No definite abscess is seen. Surgical consultation is recommended. 2. Mild ascites. 3. Diffuse colonic wall thickening which may be due to associated inflammatory changes. 4. Patchy airspace disease at the lung bases with bilateral lower lobe consolidation. 5. Cholelithiasis. 6. Aortic atherosclerosis. Critical Value/emergent results were called by telephone at the time of interpretation on 10/31/2023 at 11:59 pm to provider Dr. Charlton, who verbally acknowledged these results. Electronically Signed   By: Leita Birmingham M.D.   On: 10/31/2023 23:59      LOS: 6 days   Donalda Applebaum, MD  [facility]    To contact the attending provider between 7A-7P or the covering provider during after hours 7P-7A, please log into the web site [email or web address] and access using universal [place] password for that web site. If you do not have the password, please call the hospital operator.  11/02/2023, 8:38 AM

## 2023-11-02 NOTE — Progress Notes (Signed)
 Received patient in bed.Alert and oriented x 2.Lethargic due to sedation post -op but otherwise verbally responsive instantly. She has NGT connected to low wall suction.  Access used. Right internal jugular temp catheter. Dressing on date.  Duration of treatment : 2.5 hours.  UF  goal: Even.   Hand off to the night HD nurse.

## 2023-11-02 NOTE — Progress Notes (Addendum)
 * Day of Surgery *  Subjective: Dialyzed last night. Reports she is still having lower abdominal pain this morning, unchanged from yesterday.  Objective: Vital signs in last 24 hours: Temp:  [97.7 F (36.5 C)-98.2 F (36.8 C)] 98 F (36.7 C) (02/08 0738) Pulse Rate:  [62-81] 78 (02/08 0738) Resp:  [10-41] 41 (02/08 0500) BP: (105-146)/(50-66) 123/66 (02/08 0738) SpO2:  [94 %-100 %] 97 % (02/08 0738) Weight:  [100.5 kg-100.9 kg] 100.5 kg (02/07 2212) Last BM Date : 10/31/23  Intake/Output from previous day: 02/07 0701 - 02/08 0700 In: 218.5 [I.V.:168.5; IV Piggyback:50] Out: 500  Intake/Output this shift: No intake/output data recorded.  PE: Gen:  Drowsy but rouses easily, oriented Card:  RRR Pulm:  Normal work of breathing Abd: soft, nondistended, tender to palpation across the lower abdomen with voluntary guarding Psych: A&Ox4  Lab Results:  Recent Labs    11/01/23 0532 11/02/23 0133  WBC 12.0* 16.0*  HGB 9.0* 8.5*  HCT 27.6* 26.5*  PLT 384 373   BMET Recent Labs    11/01/23 0532 11/02/23 0133  NA 129* 132*  K 4.6 4.2  CL 97* 97*  CO2 14* 17*  GLUCOSE 236* 212*  BUN 106* 94*  CREATININE 10.48* 9.20*  CALCIUM  7.6* 7.9*   PT/INR No results for input(s): LABPROT, INR in the last 72 hours. CMP     Component Value Date/Time   NA 132 (L) 11/02/2023 0133   NA 137 02/28/2021 1548   K 4.2 11/02/2023 0133   CL 97 (L) 11/02/2023 0133   CO2 17 (L) 11/02/2023 0133   GLUCOSE 212 (H) 11/02/2023 0133   BUN 94 (H) 11/02/2023 0133   BUN 33 (H) 02/28/2021 1548   CREATININE 9.20 (H) 11/02/2023 0133   CREATININE 3.38 (H) 12/26/2015 1347   CALCIUM  7.9 (L) 11/02/2023 0133   PROT 6.0 (L) 11/02/2023 0133   PROT 7.0 11/07/2017 1414   ALBUMIN  2.0 (L) 11/02/2023 0133   ALBUMIN  4.0 11/07/2017 1414   AST 8 (L) 11/02/2023 0133   ALT 9 11/02/2023 0133   ALKPHOS 29 (L) 11/02/2023 0133   BILITOT 1.2 11/02/2023 0133   BILITOT 0.2 11/07/2017 1414   GFRNONAA 4  (L) 11/02/2023 0133   GFRNONAA 13 (L) 01/10/2015 1428   GFRAA 21 (L) 12/21/2019 1802   GFRAA 16 (L) 01/10/2015 1428   Lipase     Component Value Date/Time   LIPASE 26 10/27/2023 1936    Studies/Results: IR Fluoro Guide CV Line Right Result Date: 11/01/2023 INDICATION: ESRD requiring HD. EXAM: NON-TUNNELED CENTRAL VENOUS HEMODIALYSIS CATHETER PLACEMENT WITH ULTRASOUND AND FLUOROSCOPIC GUIDANCE COMPARISON:  CT AP 10/31/2023. MEDICATIONS: Local anesthetic was administered. FLUOROSCOPY TIME:  Fluoroscopic dose; 35 mGy COMPLICATIONS: None immediate. PROCEDURE: Informed written consent was obtained from the patient and/or patient's representative after a discussion of the risks, benefits, and alternatives to treatment. Questions regarding the procedure were encouraged and answered. The RIGHT neck and chest were prepped with chlorhexidine  in a sterile fashion, and a sterile drape was applied covering the operative field. Maximum barrier sterile technique with sterile gowns and gloves were used for the procedure. A timeout was performed prior to the initiation of the procedure. After the overlying soft tissues were anesthetized, a small venotomy incision was created and a micropuncture kit was utilized to access the external jugular vein. Real-time ultrasound guidance was utilized for vascular access including the acquisition of a permanent ultrasound image documenting patency of the accessed vessel. The microwire was utilized to  measure appropriate catheter length. A stiff glidewire was advanced to the level of the IVC. Under fluoroscopic guidance, the venotomy was serially dilated, ultimately allowing placement of a 20 cm temporary Trialysis catheter with tip ultimately terminating within the superior aspect of the right atrium. Final catheter positioning was confirmed and documented with a spot radiographic image. The catheter aspirates and flushes normally. The catheter was flushed with appropriate volume  heparin  dwells. The catheter exit site was secured with a 2-0 Ethilon retention suture. A dressing was placed. The patient tolerated the procedure well without immediate post procedural complication. IMPRESSION: Successful placement of a RIGHT EXTERNAL jugular approach 20 cm non-tunneled dialysis catheter The tip of the catheter is positioned within the proximal RIGHT atrium. The catheter is ready for immediate use. PLAN: This catheter may be converted to a tunneled dialysis catheter at a later date as indicated. Thom Hall, MD [facility] Radiology Electronically Signed   By: Thom Hall M.D.   On: 11/01/2023 18:15   IR US  Guide Vasc Access Right Result Date: 11/01/2023 INDICATION: ESRD requiring HD. EXAM: NON-TUNNELED CENTRAL VENOUS HEMODIALYSIS CATHETER PLACEMENT WITH ULTRASOUND AND FLUOROSCOPIC GUIDANCE COMPARISON:  CT AP 10/31/2023. MEDICATIONS: Local anesthetic was administered. FLUOROSCOPY TIME:  Fluoroscopic dose; 35 mGy COMPLICATIONS: None immediate. PROCEDURE: Informed written consent was obtained from the patient and/or patient's representative after a discussion of the risks, benefits, and alternatives to treatment. Questions regarding the procedure were encouraged and answered. The RIGHT neck and chest were prepped with chlorhexidine  in a sterile fashion, and a sterile drape was applied covering the operative field. Maximum barrier sterile technique with sterile gowns and gloves were used for the procedure. A timeout was performed prior to the initiation of the procedure. After the overlying soft tissues were anesthetized, a small venotomy incision was created and a micropuncture kit was utilized to access the external jugular vein. Real-time ultrasound guidance was utilized for vascular access including the acquisition of a permanent ultrasound image documenting patency of the accessed vessel. The microwire was utilized to measure appropriate  catheter length. A stiff glidewire was advanced to the level of the IVC. Under fluoroscopic guidance, the venotomy was serially dilated, ultimately allowing placement of a 20 cm temporary Trialysis catheter with tip ultimately terminating within the superior aspect of the right atrium. Final catheter positioning was confirmed and documented with a spot radiographic image. The catheter aspirates and flushes normally. The catheter was flushed with appropriate volume heparin  dwells. The catheter exit site was secured with a 2-0 Ethilon retention suture. A dressing was placed. The patient tolerated the procedure well without immediate post procedural complication. IMPRESSION: Successful placement of a RIGHT EXTERNAL jugular approach 20 cm non-tunneled dialysis catheter The tip of the catheter is positioned within the proximal RIGHT atrium. The catheter is ready for immediate use. PLAN: This catheter may be converted to a tunneled dialysis catheter at a later date as indicated. Thom Hall, MD [facility] Radiology Electronically Signed   By: Thom Hall M.D.   On: 11/01/2023 18:15   CT ABDOMEN PELVIS WO CONTRAST Result Date: 10/31/2023 CLINICAL DATA:  Diverticulitis, complication suspected. EXAM: CT ABDOMEN AND PELVIS WITHOUT CONTRAST TECHNIQUE: Multidetector CT imaging of the abdomen and pelvis was performed following the standard protocol without IV contrast. RADIATION DOSE REDUCTION: This exam was performed according to the departmental dose-optimization program which includes automated exposure control, adjustment of the mA and/or kV according to patient size and/or use of iterative reconstruction  technique. COMPARISON:  10/27/2023. FINDINGS: Lower chest: Heart is enlarged and multi-vessel coronary artery calcifications are noted. Consolidation and patchy airspace disease is present at the lung bases bilaterally. Hepatobiliary: No focal liver abnormality is seen.  Stones are present within the gallbladder. No biliary ductal dilatation. Pancreas: Unremarkable. No pancreatic ductal dilatation or surrounding inflammatory changes. Spleen: Normal in size without focal abnormality. Adrenals/Urinary Tract: The adrenal glands are within normal limits. Calcifications are present in the upper pole of the left kidney. There is a cyst in the lower pole of the right kidney. No hydronephrosis bilaterally. The bladder is unremarkable. Stomach/Bowel: The stomach is within normal limits. No bowel obstruction or pneumatosis is seen. Right hemicolectomy changes are noted. There is diffuse colonic wall thickening. Scattered diverticula are present along the colon. A few small foci of free air are noted in the mesentery in the mid left abdomen, slightly increased from the previous exam. There is suggestion of contrast extravasation in the region. There is fine no definite abscess is seen. Vascular/Lymphatic: Aortic atherosclerosis. No enlarged abdominal or pelvic lymph nodes. Reproductive: No acute abnormality. Other: Mild ascites is noted in all 4 quadrants. A fat containing umbilical hernia is noted. There is mild anasarca. Musculoskeletal: The bony structures are stable. IMPRESSION: 1. Worsening diverticulitis of the descending colon with perforation and increasing free air and suspected oral contrast extravasation in the mid left abdomen. No definite abscess is seen. Surgical consultation is recommended. 2. Mild ascites. 3. Diffuse colonic wall thickening which may be due to associated inflammatory changes. 4. Patchy airspace disease at the lung bases with bilateral lower lobe consolidation. 5. Cholelithiasis. 6. Aortic atherosclerosis. Critical Value/emergent results were called by telephone at the time of interpretation on 10/31/2023 at 11:59 pm to provider Dr. Charlton, who verbally acknowledged these results. Electronically Signed   By: Leita Birmingham M.D.   On: 10/31/2023 23:59     Anti-infectives: Anti-infectives (From admission, onward)    Start     Dose/Rate Route Frequency Ordered Stop   11/01/23 1415  piperacillin -tazobactam (ZOSYN ) IVPB 2.25 g        2.25 g 100 mL/hr over 30 Minutes Intravenous Every 8 hours 11/01/23 1316     10/29/23 1100  piperacillin -tazobactam (ZOSYN ) IVPB 2.25 g  Status:  Discontinued        2.25 g 100 mL/hr over 30 Minutes Intravenous Every 8 hours 10/29/23 1006 11/01/23 1016   10/28/23 0600  piperacillin -tazobactam (ZOSYN ) IVPB 2.25 g  Status:  Discontinued        2.25 g 100 mL/hr over 30 Minutes Intravenous Every 8 hours 10/27/23 2224 10/27/23 2235   10/28/23 0600  ceFEPIme  (MAXIPIME ) 1 g in sodium chloride  0.9 % 100 mL IVPB  Status:  Discontinued        1 g 200 mL/hr over 30 Minutes Intravenous Every 24 hours 10/27/23 2235 10/29/23 0954   10/28/23 0600  metroNIDAZOLE  (FLAGYL ) IVPB 500 mg  Status:  Discontinued        500 mg 100 mL/hr over 60 Minutes Intravenous Every 12 hours 10/27/23 2235 10/29/23 0954   10/27/23 2130  piperacillin -tazobactam (ZOSYN ) IVPB 3.375 g        3.375 g 100 mL/hr over 30 Minutes Intravenous  Once 10/27/23 2128 10/27/23 2320        Assessment/Plan  Sigmoid diverticulitis with perforation  - Hx of right hemicolectomy for polyps that were too large to be removed during colonoscopy  - Repeat CT 2/6 shows ongoing extraluminal gas and concern for  contrast leakage. Patient has a persistent leukocytosis and pain, without significant clinical improvement on antibiotics. She has decided that she no longer wants comfort measures and would like to pursue treatment. I discussed that the best treatment for her diverticulitis is surgical, as she has not had improvement on antibiotics. I again reviewed the details of an open sigmoid colectomy with end colostomy. I emphasized the colostomy could be permanent. She expressed understanding and consents to proceed with surgery. I also spoke with the patient's son [name]  via phone and reviewed the surgery details, risks and benefits. He expressed understanding and all questions were answered. Proceed to OR this morning. - Keep NPO - Heparin  gtt on hold for OR - Continue Zosyn   I reviewed nursing notes, last 24 h vitals and pain scores, last 48 h intake and output, last 24 h labs and trends, and last 24 h imaging results.   LOS: 6 days    Leonor LITTIE Dawn , MD [facility] Surgery 11/02/2023, 7:43 AM Please see Amion for pager number during day hours 7:00am-4:30pm

## 2023-11-03 ENCOUNTER — Encounter (HOSPITAL_COMMUNITY): Payer: Self-pay | Admitting: Surgery

## 2023-11-03 ENCOUNTER — Inpatient Hospital Stay (HOSPITAL_COMMUNITY): Payer: Medicare PPO

## 2023-11-03 DIAGNOSIS — Z7189 Other specified counseling: Secondary | ICD-10-CM | POA: Diagnosis not present

## 2023-11-03 DIAGNOSIS — Z515 Encounter for palliative care: Secondary | ICD-10-CM | POA: Diagnosis not present

## 2023-11-03 DIAGNOSIS — K5732 Diverticulitis of large intestine without perforation or abscess without bleeding: Secondary | ICD-10-CM | POA: Diagnosis not present

## 2023-11-03 LAB — GLUCOSE, CAPILLARY
Glucose-Capillary: 195 mg/dL — ABNORMAL HIGH (ref 70–99)
Glucose-Capillary: 221 mg/dL — ABNORMAL HIGH (ref 70–99)
Glucose-Capillary: 220 mg/dL — ABNORMAL HIGH (ref 70–99)
Glucose-Capillary: 221 mg/dL — ABNORMAL HIGH (ref 70–99)

## 2023-11-03 LAB — RENAL FUNCTION PANEL
Albumin: 2.1 g/dL — ABNORMAL LOW (ref 3.5–5.0)
Anion gap: 19 — ABNORMAL HIGH (ref 5–15)
BUN: 65 mg/dL — ABNORMAL HIGH (ref 8–23)
CO2: 19 mmol/L — ABNORMAL LOW (ref 22–32)
Calcium: 7.5 mg/dL — ABNORMAL LOW (ref 8.9–10.3)
Chloride: 95 mmol/L — ABNORMAL LOW (ref 98–111)
Creatinine, Ser: 7.34 mg/dL — ABNORMAL HIGH (ref 0.44–1.00)
GFR, Estimated: 6 mL/min — ABNORMAL LOW (ref >60)
Glucose, Bld: 193 mg/dL — ABNORMAL HIGH (ref 70–99)
Phosphorus: 6.5 mg/dL — ABNORMAL HIGH (ref 2.5–4.6)
Potassium: 4 mmol/L (ref 3.5–5.1)
Sodium: 133 mmol/L — ABNORMAL LOW (ref 135–145)

## 2023-11-03 LAB — CORTISOL: Cortisol, Plasma: 42 ug/dL

## 2023-11-03 LAB — CBC WITH DIFFERENTIAL/PLATELET
Abs Immature Granulocytes: 1.1 10*3/uL — ABNORMAL HIGH (ref 0.00–0.07)
Basophils Absolute: 0.1 10*3/uL (ref 0.0–0.1)
Basophils Relative: 0 %
Eosinophils Absolute: 0.3 10*3/uL (ref 0.0–0.5)
Eosinophils Relative: 2 %
HCT: 23.5 % — ABNORMAL LOW (ref 36.0–46.0)
Hemoglobin: 7.8 g/dL — ABNORMAL LOW (ref 12.0–15.0)
Immature Granulocytes: 8 %
Lymphocytes Relative: 5 %
Lymphs Abs: 0.7 10*3/uL (ref 0.7–4.0)
MCH: 26.1 pg (ref 26.0–34.0)
MCHC: 33.2 g/dL (ref 30.0–36.0)
MCV: 78.6 fL — ABNORMAL LOW (ref 80.0–100.0)
Monocytes Absolute: 1.6 10*3/uL — ABNORMAL HIGH (ref 0.1–1.0)
Monocytes Relative: 11 %
Neutro Abs: 10.4 10*3/uL — ABNORMAL HIGH (ref 1.7–7.7)
Neutrophils Relative %: 74 %
Platelets: 313 10*3/uL (ref 150–400)
RBC: 2.99 MIL/uL — ABNORMAL LOW (ref 3.87–5.11)
RDW: 14.7 % (ref 11.5–15.5)
WBC: 14.2 10*3/uL — ABNORMAL HIGH (ref 4.0–10.5)
nRBC: 0 % (ref 0.0–0.2)

## 2023-11-03 LAB — BRAIN NATRIURETIC PEPTIDE: B Natriuretic Peptide: 78.5 pg/mL (ref 0.0–100.0)

## 2023-11-03 LAB — C-REACTIVE PROTEIN: CRP: 22.7 mg/dL — ABNORMAL HIGH (ref <1.0)

## 2023-11-03 LAB — PROCALCITONIN: Procalcitonin: 41.53 ng/mL

## 2023-11-03 LAB — MAGNESIUM: Magnesium: 2.1 mg/dL (ref 1.7–2.4)

## 2023-11-03 LAB — HEPARIN LEVEL (UNFRACTIONATED): Heparin Unfractionated: 0.42 [IU]/mL (ref 0.30–0.70)

## 2023-11-03 LAB — TSH: TSH: 1.046 u[IU]/mL (ref 0.350–4.500)

## 2023-11-03 MED ORDER — CARVEDILOL 3.125 MG PO TABS
3.1250 mg | ORAL_TABLET | Freq: Two times a day (BID) | ORAL | Status: DC
Start: 2023-11-03 — End: 2023-11-03
  Filled 2023-11-03: qty 1

## 2023-11-03 MED ORDER — IRON SUCROSE 200 MG IVPB - SIMPLE MED
200.0000 mg | Status: DC
  Administered 2023-11-03 – 2023-11-05 (×3): 200 mg via INTRAVENOUS
  Filled 2023-11-03: qty 110
  Filled 2023-11-03 (×3): qty 200

## 2023-11-03 MED ORDER — MENTHOL 3 MG MT LOZG
1.0000 | LOZENGE | OROMUCOSAL | Status: DC | PRN
  Administered 2023-11-04 – 2023-11-05 (×2): 3 mg via ORAL
  Filled 2023-11-03: qty 9

## 2023-11-03 MED ORDER — HEPARIN (PORCINE) 25000 UT/250ML-% IV SOLN
1800.0000 [IU]/h | INTRAVENOUS | Status: DC
  Administered 2023-11-03 – 2023-11-04 (×2): 1800 [IU]/h via INTRAVENOUS
  Filled 2023-11-03 (×2): qty 250

## 2023-11-03 NOTE — Evaluation (Signed)
 Occupational Therapy Evaluation Patient Details Name: [name] MRN: [id number] DOB: August 06, 1956 Today's Date: 11/03/2023   History of Present Illness Patient is a 68 y.o.  female - who presented with abdominal pain-found to have acute diverticulitis with contained perforation involving the descending colon. 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  Past medical history of CKD 5, A-fib on Eliquis , CAD   Clinical Impression   Pt reports ind at baseline with ADL and functional mobility, lives with her son who can assist at d/c. Pt currently limited by fatigue/weakness, keeps eyes closed frequently during session and needs up to max A for ADL, mod A for bed mobility and min A for transfers with RW. Pt able to take few side steps at EOB but then sits quickly. Pt presenting with impairments listed below, will follow acutely. Patient will benefit from intensive inpatient follow-up therapy, >3 hours/day to maximize safety/ind with ADL/functional mobility.        If plan is discharge home, recommend the following: A lot of help with walking and/or transfers;A lot of help with bathing/dressing/bathroom;Assistance with cooking/housework;Direct supervision/assist for financial management;Direct supervision/assist for medications management;Assist for transportation;Help with stairs or ramp for entrance    Functional Status Assessment  Patient has had a recent decline in their functional status and demonstrates the ability to make significant improvements in function in a reasonable and predictable amount of time.  Equipment Recommendations  Other (comment) (defer)    Recommendations for Other Services PT consult;Rehab consult     Precautions / Restrictions Precautions Precautions: Fall Precaution Comments: NGT Restrictions Weight Bearing Restrictions Per Provider Order: No      Mobility Bed Mobility Overal bed mobility: Needs Assistance Bed Mobility: Supine to Sit, Sit to  Supine     Supine to sit: Mod assist Sit to supine: Mod assist   General bed mobility comments: assist for trunk during log roll and to assist feet back into bed    Transfers Overall transfer level: Needs assistance Equipment used: Rolling walker (2 wheels) Transfers: Sit to/from Stand Sit to Stand: Min assist           General transfer comment: minA, takes few side steps at EOB before needing to sit      Balance Overall balance assessment: Needs assistance Sitting-balance support: Feet supported, No upper extremity supported Sitting balance-Leahy Scale: Good     Standing balance support: During functional activity, Bilateral upper extremity supported Standing balance-Leahy Scale: Poor Standing balance comment: with RW support                           ADL either performed or assessed with clinical judgement   ADL Overall ADL's : Needs assistance/impaired Eating/Feeding: NPO   Grooming: Minimal assistance   Upper Body Bathing: Moderate assistance   Lower Body Bathing: Maximal assistance   Upper Body Dressing : Moderate assistance   Lower Body Dressing: Maximal assistance   Toilet Transfer: Minimal assistance;Rolling walker (2 wheels)   Toileting- Clothing Manipulation and Hygiene: Moderate assistance       Functional mobility during ADLs: Minimal assistance;Rolling walker (2 wheels)       Vision   Vision Assessment?: No apparent visual deficits Additional Comments: keeps eyes closed frequently     Perception Perception: Not tested       Praxis Praxis: Not tested       Pertinent Vitals/Pain Pain Assessment Pain Assessment: Faces Pain Score: 7  Faces Pain Scale: Hurts  even more Pain Location: abdomen and throat Pain Descriptors / Indicators: Sore, Discomfort Pain Intervention(s): Limited activity within patient's tolerance, Monitored during session, Repositioned     Extremity/Trunk Assessment Upper Extremity Assessment Upper  Extremity Assessment: Generalized weakness   Lower Extremity Assessment Lower Extremity Assessment: Defer to PT evaluation   Cervical / Trunk Assessment Cervical / Trunk Assessment: Normal   Communication Communication Communication: No apparent difficulties   Cognition Arousal: Lethargic Behavior During Therapy: WFL for tasks assessed/performed Overall Cognitive Status: Within Functional Limits for tasks assessed                                 General Comments: eyes closed, slow responses, pt states she feels globally weak     General Comments  VSS on 2L O2    Exercises     Shoulder Instructions      Home Living Family/patient expects to be discharged to:: Private residence Living Arrangements: Children (son) Available Help at Discharge: Family;Available 24 hours/day Type of Home: House Home Access: Stairs to enter [employer] of Steps: 8, no steps through the side Entrance Stairs-Rails: Can reach both Home Layout: Other (Comment);Bed/bath upstairs;Multi-level Alternate Level Stairs-Number of Steps: 4 - to kitchen and bedroom Alternate Level Stairs-Rails: Left Bathroom Shower/Tub: Chief Strategy Officer: Standard Bathroom Accessibility: Yes   Home Equipment: Cane - single point;Wheelchair - Forensic Psychologist (2 wheels);Other (comment);Shower seat          Prior Functioning/Environment Prior Level of Function : Independent/Modified Independent             Mobility Comments: ind ADLs Comments: ind        OT Problem List: Decreased strength;Decreased activity tolerance;Decreased range of motion;Impaired balance (sitting and/or standing);Decreased cognition;Decreased safety awareness;Cardiopulmonary status limiting activity      OT Treatment/Interventions: Self-care/ADL training;Therapeutic exercise;Energy conservation;DME and/or AE instruction;Therapeutic activities;Patient/family education;Balance training    OT  Goals(Current goals can be found in the care plan section) Acute Rehab OT Goals Patient Stated Goal: none stated OT Goal Formulation: With patient Time For Goal Achievement: 11/17/23 Potential to Achieve Goals: Good ADL Goals Pt Will Perform Upper Body Dressing: with contact guard assist;sitting Pt Will Perform Lower Body Dressing: with min assist;with adaptive equipment;sitting/lateral leans;sit to/from stand Pt Will Transfer to Toilet: with contact guard assist;ambulating;regular height toilet Additional ADL Goal #1: Pt will tolerate OOB activity x10 min in prep for ADLs  OT Frequency: Min 1X/week    Co-evaluation              AM-PAC OT 6 Clicks Daily Activity     Outcome Measure Help from another person eating meals?: A Lot Help from another person taking care of personal grooming?: A Little Help from another person toileting, which includes using toliet, bedpan, or urinal?: A Lot Help from another person bathing (including washing, rinsing, drying)?: A Lot Help from another person to put on and taking off regular upper body clothing?: A Lot Help from another person to put on and taking off regular lower body clothing?: A Lot 6 Click Score: 13   End of Session Equipment Utilized During Treatment: Rolling walker (2 wheels);Oxygen Nurse Communication: Mobility status  Activity Tolerance: Patient tolerated treatment well Patient left: in bed;with call bell/phone within reach;with bed alarm set;with nursing/sitter in room  OT Visit Diagnosis: Unsteadiness on feet (R26.81);Other abnormalities of gait and mobility (R26.89);Muscle weakness (generalized) (M62.81)  Time: 1236-1300 OT Time Calculation (min): 24 min Charges:  OT General Charges $OT Visit: 1 Visit OT Evaluation $OT Eval Moderate Complexity: 1 Mod OT Treatments $Therapeutic Activity: 8-22 mins  Coree Brame K, OTD, OTR/L SecureChat Preferred Acute Rehab [phone]   Corlette Ciano K  Koonce 11/03/2023, 1:13 PM

## 2023-11-03 NOTE — Progress Notes (Signed)
 Assessment & Plan: POD#1 - status post ex lap, Hartmann's resection for perforated diverticulitis with diffuse peritonitis - Dr. Dasie 11/02/2023  - midline dressing dry and intact  - stoma viable - ostomy bag leaking - will replace  - HD last night  - IV Zosyn   - NPO, NG - likely to have prolonged ileus - will need meds adjusted by TRH to IV route for now        Krystal Spinner, MD [facility] practice Office: [phone]        Chief Complaint: Perforated diverticulitis with peritonitis  Subjective: Patient in bed, on phone with nephew  Objective: Vital signs in last 24 hours: Temp:  [97.5 F (36.4 C)-98.5 F (36.9 C)] 97.5 F (36.4 C) (02/08 2117) Pulse Rate:  [72-85] 83 (02/08 2117) Resp:  [11-24] 14 (02/08 2117) BP: (97-131)/(46-66) 113/52 (02/09 0443) SpO2:  [98 %-100 %] 100 % (02/08 2117) Weight:  [98.3 kg-98.7 kg] 98.3 kg (02/08 2118) Last BM Date : 10/31/23  Intake/Output from previous day: 02/08 0701 - 02/09 0700 In: 929.4 [I.V.:429.4; IV Piggyback:500] Out: 1565 [Urine:220; Emesis/NG output:400; Stool:20; Blood:25] Intake/Output this shift: No intake/output data recorded.  Physical Exam: HEENT - sclerae clear, mucous membranes moist Neck - soft Abdomen - protuberant; dressing dry and intact; stoma viable with leaking liquid stool from bag; NG bilious   Lab Results:  Recent Labs    11/02/23 0133 11/03/23 0310  WBC 16.0* 14.2*  HGB 8.5* 7.8*  HCT 26.5* 23.5*  PLT 373 313   BMET Recent Labs    11/02/23 0133 11/03/23 0310  NA 132* 133*  K 4.2 4.0  CL 97* 95*  CO2 17* 19*  GLUCOSE 212* 193*  BUN 94* 65*  CREATININE 9.20* 7.34*  CALCIUM  7.9* 7.5*   PT/INR No results for input(s): LABPROT, INR in the last 72 hours. Comprehensive Metabolic Panel:    Component Value Date/Time   NA 133 (L) 11/03/2023 0310   NA 132 (L) 11/02/2023 0133   NA 137 02/28/2021 1548   NA 138 12/21/2019 1802   K 4.0 11/03/2023 0310    K 4.2 11/02/2023 0133   CL 95 (L) 11/03/2023 0310   CL 97 (L) 11/02/2023 0133   CO2 19 (L) 11/03/2023 0310   CO2 17 (L) 11/02/2023 0133   BUN 65 (H) 11/03/2023 0310   BUN 94 (H) 11/02/2023 0133   BUN 33 (H) 02/28/2021 1548   BUN 30 (H) 12/21/2019 1802   CREATININE 7.34 (H) 11/03/2023 0310   CREATININE 9.20 (H) 11/02/2023 0133   CREATININE 3.38 (H) 12/26/2015 1347   CREATININE 3.53 (H) 01/10/2015 1428   GLUCOSE 193 (H) 11/03/2023 0310   GLUCOSE 212 (H) 11/02/2023 0133   CALCIUM  7.5 (L) 11/03/2023 0310   CALCIUM  7.9 (L) 11/02/2023 0133   AST 8 (L) 11/02/2023 0133   AST 17 10/27/2023 1936   ALT 9 11/02/2023 0133   ALT 9 10/27/2023 1936   ALKPHOS 29 (L) 11/02/2023 0133   ALKPHOS 44 10/27/2023 1936   BILITOT 1.2 11/02/2023 0133   BILITOT 1.0 10/27/2023 1936   BILITOT 0.2 11/07/2017 1414   BILITOT <0.2 01/28/2017 1425   PROT 6.0 (L) 11/02/2023 0133   PROT 7.3 10/27/2023 1936   PROT 7.0 11/07/2017 1414   PROT 7.5 01/28/2017 1425   ALBUMIN  2.1 (L) 11/03/2023 0310   ALBUMIN  2.0 (L) 11/02/2023 0133   ALBUMIN  4.0 11/07/2017 1414   ALBUMIN  4.1 01/28/2017 1425    Studies/Results:  DG Chest Port 1 View Result Date: 11/03/2023 CLINICAL DATA:  Shortness of breath EXAM: PORTABLE CHEST - 1 VIEW COMPARISON:  11/11/2018 FINDINGS: Unchanged mild cardiomegaly and pulmonary vascular congestion. Interval worsening of left basilar opacity likely due to atelectasis. Lungs otherwise clear. Examination is limited due to expiratory phase of imaging. Interval placement of temporary right IJ hemodialysis catheter which terminates in the region of the right atrium. Nasogastric tube terminates in the left upper quadrant. IMPRESSION: Interval worsening of left basilar opacity which is likely due to atelectasis. Electronically Signed   By: Aliene Lloyd M.D.   On: 11/03/2023 07:22   IR Fluoro Guide CV Line Right Result Date: 11/01/2023 INDICATION: ESRD requiring HD. EXAM: NON-TUNNELED CENTRAL VENOUS  HEMODIALYSIS CATHETER PLACEMENT WITH ULTRASOUND AND FLUOROSCOPIC GUIDANCE COMPARISON:  CT AP 10/31/2023. MEDICATIONS: Local anesthetic was administered. FLUOROSCOPY TIME:  Fluoroscopic dose; 35 mGy COMPLICATIONS: None immediate. PROCEDURE: Informed written consent was obtained from the patient and/or patient's representative after a discussion of the risks, benefits, and alternatives to treatment. Questions regarding the procedure were encouraged and answered. The RIGHT neck and chest were prepped with chlorhexidine  in a sterile fashion, and a sterile drape was applied covering the operative field. Maximum barrier sterile technique with sterile gowns and gloves were used for the procedure. A timeout was performed prior to the initiation of the procedure. After the overlying soft tissues were anesthetized, a small venotomy incision was created and a micropuncture kit was utilized to access the external jugular vein. Real-time ultrasound guidance was utilized for vascular access including the acquisition of a permanent ultrasound image documenting patency of the accessed vessel. The microwire was utilized to measure appropriate catheter length. A stiff glidewire was advanced to the level of the IVC. Under fluoroscopic guidance, the venotomy was serially dilated, ultimately allowing placement of a 20 cm temporary Trialysis catheter with tip ultimately terminating within the superior aspect of the right atrium. Final catheter positioning was confirmed and documented with a spot radiographic image. The catheter aspirates and flushes normally. The catheter was flushed with appropriate volume heparin  dwells. The catheter exit site was secured with a 2-0 Ethilon retention suture. A dressing was placed. The patient tolerated the procedure well without immediate post procedural complication. IMPRESSION: Successful placement of a RIGHT EXTERNAL jugular approach 20 cm non-tunneled dialysis catheter The tip of the catheter is  positioned within the proximal RIGHT atrium. The catheter is ready for immediate use. PLAN: This catheter may be converted to a tunneled dialysis catheter at a later date as indicated. Thom Hall, MD [facility] Electronically Signed   By: Thom Hall M.D.   On: 11/01/2023 18:15   IR US  Guide Vasc Access Right Result Date: 11/01/2023 INDICATION: ESRD requiring HD. EXAM: NON-TUNNELED CENTRAL VENOUS HEMODIALYSIS CATHETER PLACEMENT WITH ULTRASOUND AND FLUOROSCOPIC GUIDANCE COMPARISON:  CT AP 10/31/2023. MEDICATIONS: Local anesthetic was administered. FLUOROSCOPY TIME:  Fluoroscopic dose; 35 mGy COMPLICATIONS: None immediate. PROCEDURE: Informed written consent was obtained from the patient and/or patient's representative after a discussion of the risks, benefits, and alternatives to treatment. Questions regarding the procedure were encouraged and answered. The RIGHT neck and chest were prepped with chlorhexidine  in a sterile fashion, and a sterile drape was applied covering the operative field. Maximum barrier sterile technique with sterile gowns and gloves were used for the procedure. A timeout was performed prior to the initiation of the procedure. After the overlying soft tissues were anesthetized, a small venotomy incision was created and a micropuncture  kit was utilized to access the external jugular vein. Real-time ultrasound guidance was utilized for vascular access including the acquisition of a permanent ultrasound image documenting patency of the accessed vessel. The microwire was utilized to measure appropriate catheter length. A stiff glidewire was advanced to the level of the IVC. Under fluoroscopic guidance, the venotomy was serially dilated, ultimately allowing placement of a 20 cm temporary Trialysis catheter with tip ultimately terminating within the superior aspect of the right atrium. Final catheter positioning was confirmed and  documented with a spot radiographic image. The catheter aspirates and flushes normally. The catheter was flushed with appropriate volume heparin  dwells. The catheter exit site was secured with a 2-0 Ethilon retention suture. A dressing was placed. The patient tolerated the procedure well without immediate post procedural complication. IMPRESSION: Successful placement of a RIGHT EXTERNAL jugular approach 20 cm non-tunneled dialysis catheter The tip of the catheter is positioned within the proximal RIGHT atrium. The catheter is ready for immediate use. PLAN: This catheter may be converted to a tunneled dialysis catheter at a later date as indicated. Thom Hall, MD [facility] Electronically Signed   By: Thom Hall M.D.   On: 11/01/2023 18:15      Krystal Spinner 11/03/2023  Patient ID: [name], female   DOB: [birth year], 68 y.o.   MRN: [id number]

## 2023-11-03 NOTE — Progress Notes (Signed)
 PHARMACY - ANTICOAGULATION CONSULT NOTE  Pharmacy Consult:  Heparin  Indication: atrial fibrillation  Allergies  Allergen Reactions   Nsaids Other (See Comments)    CKD stage 4   Lisinopril Other (See Comments)    coughing   Peanut-Containing Drug Products Itching and Other (See Comments)    GI intolerance - diarrhea    Patient Measurements: Height: 5' 7.01 (170.2 cm) Weight: 98.3 kg (216 lb 11.4 oz) IBW/kg (Calculated) : 61.62 Heparin  Dosing Weight: 83 kg  Vital Signs: BP: 113/52 (02/09 0443)  Labs: Recent Labs    10/31/23 1759 11/01/23 0532 11/01/23 0532 11/02/23 0133 11/03/23 0310  HGB  --  9.0*   < > 8.5* 7.8*  HCT  --  27.6*  --  26.5* 23.5*  PLT  --  384  --  373 313  HEPARINUNFRC 0.69 0.80*  --   --   --   CREATININE  --  10.48*  --  9.20* 7.34*   < > = values in this interval not displayed.    Estimated Creatinine Clearance: 8.8 mL/min (A) (by C-G formula based on SCr of 7.34 mg/dL (H)).  Medications:  PTA apixaban  2.5mg  PO BID (last dose 10/27/2023 AM)  Assessment: 58 yoF presents with abdominal pain with complicated diverticulitis w/ contained perforation and possible need for colectomy. PMH: CKD 5, A fib, CAD, and CVA.  Pharmacy consulted to dose IV heparin  for Afib. 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   Patient was previously titrated to a heparin  rate of 1800 units/hr before stopping for procedure. Will resume at this rate. Hgb 7.9, platelets 313 k. RN aware to message pharmacy with any concerns or issues.   Goal of Therapy:  Heparin  level 0.3-0.7 units/ml Monitor platelets by anticoagulation protocol: Yes   Plan:  Start heparin  back at 1800 units/hr Check level in 8 hours  Check daily heparin  level and CBC Monitor for signs and symptoms of bleeding  Follow up ability to transition back to home apixaban   Chiquita LOIS Morin, PharmD, BCPS PGY2 Pharmacy Resident

## 2023-11-03 NOTE — Progress Notes (Signed)
 Daily Progress Note   Patient Name: [name]       Date: 11/03/2023 DOB: June 09, 1956  Age: 68 y.o. MRN#: [id number] Attending Physician: Dennise Lavada POUR, MD Primary Care Physician: Campbell Reynolds, NP Admit Date: 10/27/2023  Reason for Consultation/Follow-up: Establishing goals of care   Length of Stay: 7  Current Medications: Scheduled Meds:  . carvedilol   3.125 mg Oral BID WC  . Chlorhexidine  Gluconate Cloth  6 each Topical Q0600  . insulin  aspart  0-6 Units Subcutaneous TID WC  . lidocaine -EPINEPHrine  (PF)  10 mL Infiltration Once  . mouth rinse  15 mL Mouth Rinse 4 times per day  . sodium bicarbonate   650 mg Oral TID  . sodium chloride  flush  10-40 mL Intracatheter Q12H    Continuous Infusions: . heparin     . lactated ringers  40 mL/hr at 11/02/23 1509  . piperacillin -tazobactam (ZOSYN )  IV 2.25 g (11/03/23 0614)    PRN Meds: acetaminophen  **OR** acetaminophen , albuterol , hydrALAZINE , HYDROmorphone  (DILAUDID ) injection, ondansetron  (ZOFRAN ) IV, mouth rinse  Physical Exam Vitals reviewed.  Constitutional:      General: She is not in acute distress.    Interventions: Nasal cannula in place.     Comments: NG tube  HENT:     Head: Normocephalic and atraumatic.  Cardiovascular:     Rate and Rhythm: Normal rate.  Pulmonary:     Effort: Pulmonary effort is normal.  Skin:    General: Skin is warm and dry.  Neurological:     Mental Status: She is alert and oriented to person, place, and time.  Psychiatric:        Mood and Affect: Mood normal.        Behavior: Behavior normal.             Vital Signs: BP (!) 113/52   Pulse 83   Temp (!) 97.5 F (36.4 C) (Oral)   Resp 14   Ht 5' 7.01 (1.702 m)   Wt 98.3 kg   SpO2 100%   BMI 33.93 kg/m  SpO2: SpO2: 100  % O2 Device: O2 Device: Nasal Cannula O2 Flow Rate: O2 Flow Rate (L/min): 2 L/min     Patient Active Problem List   Diagnosis Date Noted  . Diverticulitis of large intestine with complication 10/27/2023  . Chronic kidney disease, stage  5 (HCC) 10/27/2023  . Chronic nonintractable headache 03/03/2021  . Foot callus 02/27/2021  . Counseled about COVID-19 virus infection 02/24/2020  . Aortic heart murmur 05/15/2019  . Thyroid  enlargement 07/19/2014  . Vitamin D  deficiency 09/03/2013  . Hyperlipidemia 04/24/2013  . Malunion of fracture 03/17/2013  . Wound infection after surgery 03/17/2013  . Diabetic retinopathy (HCC) 01/23/2013  . Chronic atrial fibrillation (HCC) 01/15/2013  . CAD (coronary artery disease) 01/11/2013  . H/O non-ST elevation myocardial infarction (NSTEMI) 01/09/2013  . History of stroke 01/08/2013  . Diabetic neuropathy 01/08/2013  . Hypertension   . Diabetes mellitus   . Chronic kidney disease (CKD), stage IV (severe) [facility])     Palliative Care Assessment & Plan   Patient Profile: 68 y.o. female  with past medical history of CKD stage V, A-fib on Eliquis , DM type 2, CAD, and HTN who presented to the ED on 10/27/2023 with abdominal pain and was found to have acute diverticulitis with contained perforation involving the descending colon.  She also has AKI on CKD stage V, and BUN/creatinine is trending up.   Palliative Medicine has been consulted for goals of care and medical decision making.  Today's Discussion: Patient sitting up in bed in NAD. She shares that she had the colectomy with colostomy yesterday 2/8. She has had dialysis twice. She shares that she has not liked dialysis. She shares that she knew she would not want/ like dialysis.   She shares the complex family dynamics. She shares that she has always been the person in the family to take care of everybody else and fix all the problems. It seems her family is very dependent on her. She shares that her  sisters came up from [place] on Friday and convinced/ pressured her into changing from comfort measures to full scope of care.   We discuss that her medical decisions are ultimately up to her. Emphasized the importance of considering her quality of life, overall suffering, what would be acceptable to her in the future.We discussed that she does not have to continue dialysis if she continues to not like it. Emotional support provided.   Encouraged her to call PMT with questions or concerns. PMT will continue to follow.  Recommendations/Plan: Full code Full scope Continue dialysis PMT will continue to support   Code Status:    Code Status Orders  (From admission, onward)           Start     Ordered   11/01/23 1016  Do not attempt resuscitation (DNR) - Comfort care  Continuous       Question Answer Comment  If patient has no pulse and is not breathing Do Not Attempt Resuscitation   In Pre-Arrest Conditions (Patient Is Breathing and Has a Pulse) Provide comfort measures. Relieve any mechanical airway obstruction. Avoid transfer unless required for comfort.   Consent: Discussion documented in EHR or advanced directives reviewed      11/01/23 1016           Extensive chart review has been completed prior to seeing the patient including labs, vital signs, imaging, progress/consult notes, orders, medications, and available advance directive documents.  Care plan was discussed with bedside RN  Time: 50 minutes  Thank you for allowing the Palliative Medicine Team to assist in the care of this patient.    Stephane CHRISTELLA Palin, NP  Please contact Palliative Medicine Team phone at [phone] for questions and concerns.

## 2023-11-03 NOTE — Progress Notes (Signed)
 PHARMACY - ANTICOAGULATION CONSULT NOTE  Pharmacy Consult:  Heparin  Indication: atrial fibrillation  Allergies  Allergen Reactions   Nsaids Other (See Comments)    CKD stage 4   Lisinopril Other (See Comments)    coughing   Peanut-Containing Drug Products Itching and Other (See Comments)    GI intolerance - diarrhea    Patient Measurements: Height: 5' 7.01 (170.2 cm) Weight: 98.3 kg (216 lb 11.4 oz) IBW/kg (Calculated) : 61.62 Heparin  Dosing Weight: 83 kg  Vital Signs: Temp: 98.3 F (36.8 C) (02/09 1532) Temp Source: Oral (02/09 1532) BP: 117/61 (02/09 1600) Pulse Rate: 82 (02/09 1600)  Labs: Recent Labs    11/01/23 0532 11/02/23 0133 11/03/23 0310 11/03/23 1812  HGB 9.0* 8.5* 7.8*  --   HCT 27.6* 26.5* 23.5*  --   PLT 384 373 313  --   HEPARINUNFRC 0.80*  --   --  0.42  CREATININE 10.48* 9.20* 7.34*  --     Estimated Creatinine Clearance: 8.8 mL/min (A) (by C-G formula based on SCr of 7.34 mg/dL (H)).  Medications:  PTA apixaban  2.5mg  PO BID (last dose 10/27/2023 AM)  Assessment: 16 yoF presents with abdominal pain with complicated diverticulitis w/ contained perforation and possible need for colectomy. PMH: CKD 5, A fib, CAD, and CVA.  Pharmacy consulted to dose IV heparin  for Afib. 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   Heparin  level therapeutic (0.42) on infusion at 1800 units/hr. No bleeding noted.  Goal of Therapy:  Heparin  level 0.3-0.7 units/ml Monitor platelets by anticoagulation protocol: Yes   Plan:  Continue heparin  at 1800 units/hr Daily heparin  level and CBC Follow up ability to transition back to home apixaban   Vito Ralph, PharmD, BCPS Please see amion for complete clinical pharmacist phone list 11/03/2023 6:55 PM

## 2023-11-03 NOTE — Progress Notes (Signed)
 Patient ID: [name], female   DOB: [birth year], 68 y.o.   MRN: [id number] [facility] Progress Note   Assessment/ Plan:   1.  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  Started on dialysis 2 days ago with worsening azotemia/mild uremic symptoms after patient changed her mind about pursuing long-term dialysis.  Underwent her second dialysis treatment yesterday via right IJ temporary dialysis catheter [facility] deferred because of leukocytosis) and will order for her third dialysis treatment again tomorrow.  Will plan for conversion to TDC/permanent access as clinical status permits. 2.  Acute diverticulitis with contained perforation of descending colon: On Zosyn  and now status post exploratory laparotomy with Hartman's resection for perforated diverticulitis/peritonitis yesterday (2/8).  With NG tube in place and fear to likely have prolonged ileus; converting medications to parenteral route. 3.  Anion gap metabolic acidosis: Likely secondary to combination of acute kidney injury and ongoing infection, discontinue sodium bicarbonate  now that she is on dialysis. 4.  Anemia: Likely secondary to chronic illness, iron  stores significantly low and I will start her on intravenous iron  for loading prior to starting ESA.  Subjective:   Reports to be in some pain as expected postoperatively.  Discomfort from NGT.   Objective:   BP (!) 113/52   Pulse 83   Temp (!) 97.5 F (36.4 C) (Oral)   Resp 14   Ht 5' 7.01 (1.702 m)   Wt 98.3 kg   SpO2 100%   BMI 33.93 kg/m   Intake/Output Summary (Last 24 hours) at 11/03/2023 0953 Last data filed at 11/03/2023 0600 Gross per 24 hour  Intake 929.44 ml  Output 1565 ml  Net -635.56 ml   Weight change: -0.4 kg  Physical Exam: Gen: Uncomfortably resting in bed, NGT in place with LIS and bilious drainage CVS:  Pulse regular rhythm, normal rate, S1 and S2 normal Resp: Clear to auscultation bilaterally with diminished breath sounds over bases, no rales Abd: Protuberant with clean intact ex lap dressing and ostomy/ostomy bag in situ Ext: Trace ankle edema.  No asterixis/myoclonic jerks noted  Imaging: DG Chest Port 1 View Result Date: 11/03/2023 CLINICAL DATA:  Shortness of breath EXAM: PORTABLE CHEST - 1 VIEW COMPARISON:  11/11/2018 FINDINGS: Unchanged mild cardiomegaly and pulmonary vascular congestion. Interval worsening of left basilar opacity likely due to atelectasis. Lungs otherwise clear. Examination is limited due to expiratory phase of imaging. Interval placement of temporary right IJ hemodialysis catheter which terminates in the region of the right atrium. Nasogastric tube terminates in the left upper quadrant. IMPRESSION: Interval worsening of left basilar opacity which is likely due to atelectasis. Electronically Signed   By: Aliene Lloyd M.D.   On: 11/03/2023 07:22   IR Fluoro Guide CV Line Right Result Date: 11/01/2023 INDICATION: ESRD requiring HD. EXAM: NON-TUNNELED CENTRAL VENOUS HEMODIALYSIS CATHETER PLACEMENT WITH ULTRASOUND AND FLUOROSCOPIC GUIDANCE COMPARISON:  CT AP 10/31/2023. MEDICATIONS: Local anesthetic was administered. FLUOROSCOPY TIME:  Fluoroscopic dose; 35 mGy COMPLICATIONS: None immediate. PROCEDURE: Informed written consent was obtained from the patient and/or patient's representative after a discussion of the risks, benefits, and alternatives to treatment. Questions regarding the procedure were encouraged and answered. The RIGHT neck and chest were prepped with chlorhexidine  in a sterile fashion, and a sterile drape was applied covering the operative field. Maximum barrier sterile technique with sterile gowns and gloves were used for the procedure. A timeout was performed prior to  the initiation of the procedure. After the overlying soft tissues were anesthetized, a small venotomy  incision was created and a micropuncture kit was utilized to access the external jugular vein. Real-time ultrasound guidance was utilized for vascular access including the acquisition of a permanent ultrasound image documenting patency of the accessed vessel. The microwire was utilized to measure appropriate catheter length. A stiff glidewire was advanced to the level of the IVC. Under fluoroscopic guidance, the venotomy was serially dilated, ultimately allowing placement of a 20 cm temporary Trialysis catheter with tip ultimately terminating within the superior aspect of the right atrium. Final catheter positioning was confirmed and documented with a spot radiographic image. The catheter aspirates and flushes normally. The catheter was flushed with appropriate volume heparin  dwells. The catheter exit site was secured with a 2-0 Ethilon retention suture. A dressing was placed. The patient tolerated the procedure well without immediate post procedural complication. IMPRESSION: Successful placement of a RIGHT EXTERNAL jugular approach 20 cm non-tunneled dialysis catheter The tip of the catheter is positioned within the proximal RIGHT atrium. The catheter is ready for immediate use. PLAN: This catheter may be converted to a tunneled dialysis catheter at a later date as indicated. Thom Hall, MD [facility] Radiology Electronically Signed   By: Thom Hall M.D.   On: 11/01/2023 18:15   IR US  Guide Vasc Access Right Result Date: 11/01/2023 INDICATION: ESRD requiring HD. EXAM: NON-TUNNELED CENTRAL VENOUS HEMODIALYSIS CATHETER PLACEMENT WITH ULTRASOUND AND FLUOROSCOPIC GUIDANCE COMPARISON:  CT AP 10/31/2023. MEDICATIONS: Local anesthetic was administered. FLUOROSCOPY TIME:  Fluoroscopic dose; 35 mGy COMPLICATIONS: None immediate. PROCEDURE: Informed written consent was obtained from the patient and/or patient's representative after a discussion of the risks, benefits,  and alternatives to treatment. Questions regarding the procedure were encouraged and answered. The RIGHT neck and chest were prepped with chlorhexidine  in a sterile fashion, and a sterile drape was applied covering the operative field. Maximum barrier sterile technique with sterile gowns and gloves were used for the procedure. A timeout was performed prior to the initiation of the procedure. After the overlying soft tissues were anesthetized, a small venotomy incision was created and a micropuncture kit was utilized to access the external jugular vein. Real-time ultrasound guidance was utilized for vascular access including the acquisition of a permanent ultrasound image documenting patency of the accessed vessel. The microwire was utilized to measure appropriate catheter length. A stiff glidewire was advanced to the level of the IVC. Under fluoroscopic guidance, the venotomy was serially dilated, ultimately allowing placement of a 20 cm temporary Trialysis catheter with tip ultimately terminating within the superior aspect of the right atrium. Final catheter positioning was confirmed and documented with a spot radiographic image. The catheter aspirates and flushes normally. The catheter was flushed with appropriate volume heparin  dwells. The catheter exit site was secured with a 2-0 Ethilon retention suture. A dressing was placed. The patient tolerated the procedure well without immediate post procedural complication. IMPRESSION: Successful placement of a RIGHT EXTERNAL jugular approach 20 cm non-tunneled dialysis catheter The tip of the catheter is positioned within the proximal RIGHT atrium. The catheter is ready for immediate use. PLAN: This catheter may be converted to a tunneled dialysis catheter at a later date as indicated. Thom Hall, MD [facility] Radiology Electronically Signed   By: Thom Hall M.D.   On: 11/01/2023 18:15    Labs: BMET Recent Labs   Lab 10/28/23 0522 10/29/23 0725 10/30/23 0501 10/31/23  0503 11/01/23 0532 11/02/23 0133 11/03/23 0310  NA 134* 138 135 134* 129* 132* 133*  K 4.2 4.4 4.4 4.1 4.6 4.2 4.0  CL 102 103 102 102 97* 97* 95*  CO2 18* 19* 18* 18* 14* 17* 19*  GLUCOSE 267* 178* 209* 152* 236* 212* 193*  BUN 76* 95* 101* 100* 106* 94* 65*  CREATININE 7.33* 8.43* 9.26* 9.58* 10.48* 9.20* 7.34*  CALCIUM  8.0* 8.0* 7.9* 7.6* 7.6* 7.9* 7.5*  PHOS 6.7* 7.1* 6.8* 6.1* 7.6*  --  6.5*   CBC Recent Labs  Lab 10/27/23 1936 10/28/23 0522 10/31/23 0503 11/01/23 0532 11/02/23 0133 11/03/23 0310  WBC 16.8*   < > 13.7* 12.0* 16.0* 14.2*  NEUTROABS 15.2*  --   --   --   --  10.4*  HGB 9.8*   < > 7.4* 9.0* 8.5* 7.8*  HCT 31.5*   < > 22.7* 27.6* 26.5* 23.5*  MCV 82.9   < > 79.9* 79.8* 80.1 78.6*  PLT 435*   < > 337 384 373 313   < > = values in this interval not displayed.    Medications:     carvedilol   3.125 mg Oral BID WC   Chlorhexidine  Gluconate Cloth  6 each Topical Q0600   insulin  aspart  0-6 Units Subcutaneous TID WC   lidocaine -EPINEPHrine  (PF)  10 mL Infiltration Once   mouth rinse  15 mL Mouth Rinse 4 times per day   sodium bicarbonate   650 mg Oral TID   sodium chloride  flush  10-40 mL Intracatheter Q12H    Gordy Blanch, MD 11/03/2023, 9:53 AM

## 2023-11-03 NOTE — Plan of Care (Signed)
  Problem: Clinical Measurements: Goal: Respiratory complications will improve Outcome: Progressing   Problem: Nutrition: Goal: Adequate nutrition will be maintained Outcome: Not Progressing   Problem: Pain Managment: Goal: General experience of comfort will improve and/or be controlled Outcome: Progressing   Problem: Respiratory: Goal: Verbalizations of increased ease of respirations will increase Outcome: Progressing   Problem: Role Relationship: Goal: Family's ability to cope with current situation will improve Outcome: Progressing   Problem: Pain Management: Goal: Satisfaction with pain management regimen will improve Outcome: Progressing

## 2023-11-03 NOTE — Progress Notes (Signed)
 PROGRESS NOTE        PATIENT DETAILS Name: [name] Age: 68 y.o. Sex: female Date of Birth: 1956-07-26 Admit Date: 10/27/2023 Admitting Physician Dorn Dawson, MD ERE:Duntz, Damian, NP  Brief Summary: Patient is a 68 y.o.  female with history of CKD 5, A-fib on Eliquis , CAD-who presented with abdominal pain-found to have acute diverticulitis with contained perforation involving the descending colon.  Hospital course complicated by worsening AKI-thought to have progressed to ESRD-requiring initiation of HD.  See below for further details.  Significant events: 2/2>> admit to [facility] 2/6>> CT abdomen worsening diverticulitis with perforation 2/7>> transitioned to full comfort measures-as with worsening AKI-however multiple family members arrived-comfort care revoked-now full code-full scope of treatment including HD/laparotomy with colostomy.  General surgery/nephrology reconsulted.  IR placed [facility] and patient underwent first hemodialysis.  2/8>> remains n.p.o-colectomy/colostomy later today.  Significant studies: 2/2>> CT abdomen/pelvis: Acute diverticulitis-descending colon with contained perforation 2/6>> CT abdomen/pelvis: Worsening diverticulitis of descending colon with perforation and increasing failure-suspected oral contrast extravasation left abdomen.  Significant microbiology data: 2/2>> COVID/influenza/RSV PCR: Negative  Procedures: 2/8>> by Dr. Dasie underwent exploratory laparotomy with Hartman's resection for perforated diverticulitis with diffuse peritonitis, colostomy in place.  Consults: Surgery Renal Palliative IR  Subjective: Patient in bed denies any headache or chest pain, had a small BM yesterday, no nausea, abdominal discomfort but no pain, no focal weakness.  Objective: Vitals: Blood pressure (!) 113/52, pulse 83, temperature (!) 97.5 F (36.4 C), temperature source Oral, resp. rate 14, height 5' 7.01 (1.702 m), weight  98.3 kg, SpO2 100%.   Exam:  Awake Alert, No new F.N deficits, NG-tube in place to intermittent suction, colostomy bag in place, right IJ HD catheter, Foley catheter  Arrey.AT,[name], [name],   Symmetrical Chest wall movement, Good air movement bilaterally, CTAB RRR,No Gallops, Rubs or new Murmurs,  +ve B.Sounds, Abd Soft, No tenderness,   No Cyanosis, Clubbing or edema    Assessment/Plan: Acute diverticulitis with contained perforation of the descending colon Initially was managed conservatively, subsequently 2/8>> by Dr. Dasie general surgery she underwent exploratory laparotomy with Hartman's resection for perforated diverticulitis with diffuse peritonitis, colostomy in place, continue Zosyn , continue NG tube, defer further management of this issue to General surgery.  AKI on CKD 5 with progression to ESRD AKI likely hemodynamically mediated-has now likely progressed to ESRD.  HD initiated via right IJ temporary catheter.  CAD Currently no anginal symptoms Suspect not on antiplatelets as on anticoagulation with Eliquis  Continue beta-blocker On Repatha injections as an outpatient.  PAF Sinus rhythm Continue Coreg  Resume IV heparin  when okay with surgery-Eliquis  obviously on hold.  HTN BP stable-continue Coreg /amlodipine .  DM-2 (A1c 6.6 on 2/3) CBGs relatively stable on SSI-follow/optimize as diet get started back on.  Recent Labs    11/02/23 1316 11/02/23 1647 11/03/23 0819  GLUCAP 225* 240* 195*    Palliative care/goals of care Initially DNR-and wanted to do comfort care, now is full code wants to undergo aggressive treatment including surgery and dialysis  Class 1 Obesity: Estimated body mass index is 33.93 kg/m as calculated from the following:   Height as of this encounter: 5' 7.01 (1.702 m).   Weight as of this encounter: 98.3 kg.    Code status:   Code Status: Full Code   DVT Prophylaxis:  IV Heparin   Family Communication: Son-[name]-  [phone] updated 2/7   Disposition Plan: Status is: Inpatient Remains inpatient appropriate because: Severity of illness   Planned Discharge Destination:Residential hospice   Diet: Diet Order             Diet NPO time specified Except for: Sips with Meds  Diet effective now                   MEDICATIONS: Scheduled Meds:  Chlorhexidine  Gluconate Cloth  6 each Topical Q0600   insulin  aspart  0-6 Units Subcutaneous TID WC   lidocaine -EPINEPHrine  (PF)  10 mL Infiltration Once   mouth rinse  15 mL Mouth Rinse 4 times per day   sodium chloride  flush  10-40 mL Intracatheter Q12H   Continuous Infusions:  heparin  1,800 Units/hr (11/03/23 1010)   iron  sucrose     lactated ringers  40 mL/hr at 11/02/23 1509   piperacillin -tazobactam (ZOSYN )  IV 2.25 g (11/03/23 0614)   PRN Meds:.acetaminophen  **OR** acetaminophen , albuterol , hydrALAZINE , HYDROmorphone  (DILAUDID ) injection, ondansetron  (ZOFRAN ) IV, mouth rinse   I have personally reviewed following labs and imaging studies  LABORATORY DATA:   Data Review:    Recent Labs  Lab 10/27/23 1936 10/28/23 0522 10/30/23 0501 10/31/23 0503 11/01/23 0532 11/02/23 0133 11/03/23 0310  WBC 16.8*   < > 17.3* 13.7* 12.0* 16.0* 14.2*  HGB 9.8*   < > 7.9* 7.4* 9.0* 8.5* 7.8*  HCT 31.5*   < > 24.6* 22.7* 27.6* 26.5* 23.5*  PLT 435*   < > 356 337 384 373 313  MCV 82.9   < > 81.2 79.9* 79.8* 80.1 78.6*  MCH 25.8*   < > 26.1 26.1 26.0 25.7* 26.1  MCHC 31.1   < > 32.1 32.6 32.6 32.1 33.2  RDW 14.9   < > 14.6 14.8 14.7 14.7 14.7  LYMPHSABS 0.5*  --   --   --   --   --  0.7  MONOABS 0.9  --   --   --   --   --  1.6*  EOSABS 0.0  --   --   --   --   --  0.3  BASOSABS 0.0  --   --   --   --   --  0.1   < > = values in this interval not displayed.    Recent Labs  Lab 10/27/23 1936 10/27/23 1936 10/27/23 2240 10/28/23 0522 10/29/23 0725 10/30/23 0501 10/31/23 0503 11/01/23 0532 11/02/23 0133 11/03/23 0310  NA 134*  --   --   134* 138 135 134* 129* 132* 133*  K 4.8  --   --  4.2 4.4 4.4 4.1 4.6 4.2 4.0  CL 100  --   --  102 103 102 102 97* 97* 95*  CO2 18*  --   --  18* 19* 18* 18* 14* 17* 19*  ANIONGAP 16*  --   --  14 16* 15 14 18* 18* 19*  GLUCOSE 234*  --   --  267* 178* 209* 152* 236* 212* 193*  BUN 77*  --   --  76* 95* 101* 100* 106* 94* 65*  CREATININE 7.05*  --   --  7.33* 8.43* 9.26* 9.58* 10.48* 9.20* 7.34*  AST 17  --   --   --   --   --   --   --  8*  --   ALT 9  --   --   --   --   --   --   --  9  --   ALKPHOS 44  --   --   --   --   --   --   --  29*  --   BILITOT 1.0  --   --   --   --   --   --   --  1.2  --   ALBUMIN  3.4*  --   --   --  2.8* 2.6* 2.4* 2.2* 2.0* 2.1*  CRP  --   --   --   --   --   --   --   --   --  22.7*  PROCALCITON  --   --   --   --   --   --   --   --   --  41.53  LATICACIDVEN  --   --  1.0  --   --   --   --   --   --   --   INR  --   --   --  >10.0*  --   --   --   --   --   --   TSH  --   --   --   --   --   --   --   --   --  1.046  HGBA1C  --   --   --  6.6*  --   --   --   --   --   --   BNP  --   --   --   --   --   --   --   --   --  78.5  MG  --   --   --  2.2  --   --   --   --   --  2.1  PHOS  --    < >  --  6.7* 7.1* 6.8* 6.1* 7.6*  --  6.5*  CALCIUM  8.4*  --   --  8.0* 8.0* 7.9* 7.6* 7.6* 7.9* 7.5*   < > = values in this interval not displayed.      Recent Labs  Lab 10/27/23 2240 10/28/23 0522 10/29/23 0725 10/30/23 0501 10/31/23 0503 11/01/23 0532 11/02/23 0133 11/03/23 0310  CRP  --   --   --   --   --   --   --  22.7*  PROCALCITON  --   --   --   --   --   --   --  41.53  LATICACIDVEN 1.0  --   --   --   --   --   --   --   INR  --  >10.0*  --   --   --   --   --   --   TSH  --   --   --   --   --   --   --  1.046  HGBA1C  --  6.6*  --   --   --   --   --   --   BNP  --   --   --   --   --   --   --  78.5  MG  --  2.2  --   --   --   --   --  2.1  CALCIUM   --  8.0*   < > 7.9* 7.6* 7.6* 7.9* 7.5*   < > = values  in this interval not  displayed.    --------------------------------------------------------------------------------------------------------------- Lab Results  Component Value Date   CHOL 134 12/21/2019   HDL 49 12/21/2019   LDLCALC 69 12/21/2019   TRIG 82 12/21/2019   CHOLHDL 2.7 12/21/2019    Lab Results  Component Value Date   HGBA1C 6.6 (H) 10/28/2023   Recent Labs    11/03/23 0310  TSH 1.046   No results for input(s): VITAMINB12, FOLATE, FERRITIN, TIBC, IRON , RETICCTPCT in the last 72 hours. ------------------------------------------------------------------------------------------------------------------ Cardiac Enzymes No results for input(s): CKMB, TROPONINI, MYOGLOBIN in the last 168 hours.  Invalid input(s): CK  Micro Results Recent Results (from the past 240 hours)  Resp panel by RT-PCR (RSV, Flu A&B, Covid) Anterior Nasal Swab     Status: None   Collection Time: 10/27/23  7:18 PM   Specimen: Anterior Nasal Swab  Result Value Ref Range Status   SARS Coronavirus 2 by RT PCR NEGATIVE NEGATIVE Final   Influenza A by PCR NEGATIVE NEGATIVE Final   Influenza B by PCR NEGATIVE NEGATIVE Final    Comment: (NOTE) The Xpert Xpress SARS-CoV-2/FLU/RSV plus assay is intended as an aid in the diagnosis of influenza from Nasopharyngeal swab specimens and should not be used as a sole basis for treatment. Nasal washings and aspirates are unacceptable for Xpert Xpress SARS-CoV-2/FLU/RSV testing.  Fact Sheet for Patients: bloggercourse.com  Fact Sheet for Healthcare Providers: seriousbroker.it  This test is not yet approved or cleared by the [place]  FDA and has been authorized for detection and/or diagnosis of SARS-CoV-2 by FDA under an Emergency Use Authorization (EUA). This EUA will remain in effect (meaning this test can be used) for the duration of the COVID-19 declaration under Section 564(b)(1) of the Act, 21  U.S.C. section 360bbb-3(b)(1), unless the authorization is terminated or revoked.     Resp Syncytial Virus by PCR NEGATIVE NEGATIVE Final    Comment: (NOTE) Fact Sheet for Patients: bloggercourse.com  Fact Sheet for Healthcare Providers: seriousbroker.it  This test is not yet approved or cleared by the [place]  FDA and has been authorized for detection and/or diagnosis of SARS-CoV-2 by FDA under an Emergency Use Authorization (EUA). This EUA will remain in effect (meaning this test can be used) for the duration of the COVID-19 declaration under Section 564(b)(1) of the Act, 21 U.S.C. section 360bbb-3(b)(1), unless the authorization is terminated or revoked.  Performed at [facility], [address] [place], [place] [address]     Radiology Reports DG Chest [place] 1 View Result Date: 11/03/2023 CLINICAL DATA:  Shortness of breath EXAM: PORTABLE CHEST - 1 VIEW COMPARISON:  11/11/2018 FINDINGS: Unchanged mild cardiomegaly and pulmonary vascular congestion. Interval worsening of left basilar opacity likely due to atelectasis. Lungs otherwise clear. Examination is limited due to expiratory phase of imaging. Interval placement of temporary right IJ hemodialysis catheter which terminates in the region of the right atrium. Nasogastric tube terminates in the left upper quadrant. IMPRESSION: Interval worsening of left basilar opacity which is likely due to atelectasis. Electronically Signed   By: Aliene Lloyd M.D.   On: 11/03/2023 07:22   IR Fluoro Guide CV Line Right Result Date: 11/01/2023 INDICATION: ESRD requiring HD. EXAM: NON-TUNNELED CENTRAL VENOUS HEMODIALYSIS CATHETER PLACEMENT WITH ULTRASOUND AND FLUOROSCOPIC GUIDANCE COMPARISON:  CT AP 10/31/2023. MEDICATIONS: Local anesthetic was administered. FLUOROSCOPY TIME:  Fluoroscopic dose; 35 mGy COMPLICATIONS: None immediate. PROCEDURE: Informed written consent was obtained from the  patient and/or patient's representative after a discussion of the risks, benefits, and  alternatives to treatment. Questions regarding the procedure were encouraged and answered. The RIGHT neck and chest were prepped with chlorhexidine  in a sterile fashion, and a sterile drape was applied covering the operative field. Maximum barrier sterile technique with sterile gowns and gloves were used for the procedure. A timeout was performed prior to the initiation of the procedure. After the overlying soft tissues were anesthetized, a small venotomy incision was created and a micropuncture kit was utilized to access the external jugular vein. Real-time ultrasound guidance was utilized for vascular access including the acquisition of a permanent ultrasound image documenting patency of the accessed vessel. The microwire was utilized to measure appropriate catheter length. A stiff glidewire was advanced to the level of the IVC. Under fluoroscopic guidance, the venotomy was serially dilated, ultimately allowing placement of a 20 cm temporary Trialysis catheter with tip ultimately terminating within the superior aspect of the right atrium. Final catheter positioning was confirmed and documented with a spot radiographic image. The catheter aspirates and flushes normally. The catheter was flushed with appropriate volume heparin  dwells. The catheter exit site was secured with a 2-0 Ethilon retention suture. A dressing was placed. The patient tolerated the procedure well without immediate post procedural complication. IMPRESSION: Successful placement of a RIGHT EXTERNAL jugular approach 20 cm non-tunneled dialysis catheter The tip of the catheter is positioned within the proximal RIGHT atrium. The catheter is ready for immediate use. PLAN: This catheter may be converted to a tunneled dialysis catheter at a later date as indicated. Thom Hall, MD [facility] Radiology Electronically  Signed   By: Thom Hall M.D.   On: 11/01/2023 18:15   IR US  Guide Vasc Access Right Result Date: 11/01/2023 INDICATION: ESRD requiring HD. EXAM: NON-TUNNELED CENTRAL VENOUS HEMODIALYSIS CATHETER PLACEMENT WITH ULTRASOUND AND FLUOROSCOPIC GUIDANCE COMPARISON:  CT AP 10/31/2023. MEDICATIONS: Local anesthetic was administered. FLUOROSCOPY TIME:  Fluoroscopic dose; 35 mGy COMPLICATIONS: None immediate. PROCEDURE: Informed written consent was obtained from the patient and/or patient's representative after a discussion of the risks, benefits, and alternatives to treatment. Questions regarding the procedure were encouraged and answered. The RIGHT neck and chest were prepped with chlorhexidine  in a sterile fashion, and a sterile drape was applied covering the operative field. Maximum barrier sterile technique with sterile gowns and gloves were used for the procedure. A timeout was performed prior to the initiation of the procedure. After the overlying soft tissues were anesthetized, a small venotomy incision was created and a micropuncture kit was utilized to access the external jugular vein. Real-time ultrasound guidance was utilized for vascular access including the acquisition of a permanent ultrasound image documenting patency of the accessed vessel. The microwire was utilized to measure appropriate catheter length. A stiff glidewire was advanced to the level of the IVC. Under fluoroscopic guidance, the venotomy was serially dilated, ultimately allowing placement of a 20 cm temporary Trialysis catheter with tip ultimately terminating within the superior aspect of the right atrium. Final catheter positioning was confirmed and documented with a spot radiographic image. The catheter aspirates and flushes normally. The catheter was flushed with appropriate volume heparin  dwells. The catheter exit site was secured with a 2-0 Ethilon retention suture. A dressing was placed. The patient tolerated the procedure well without  immediate post procedural complication. IMPRESSION: Successful placement of a RIGHT EXTERNAL jugular approach 20 cm non-tunneled dialysis catheter The tip of the catheter is positioned within the proximal RIGHT atrium. The catheter is ready for immediate use. PLAN: This catheter may  be converted to a tunneled dialysis catheter at a later date as indicated. Thom Hall, MD [facility] Radiology Electronically Signed   By: Thom Hall M.D.   On: 11/01/2023 18:15   CT ABDOMEN PELVIS WO CONTRAST Result Date: 10/31/2023 CLINICAL DATA:  Diverticulitis, complication suspected. EXAM: CT ABDOMEN AND PELVIS WITHOUT CONTRAST TECHNIQUE: Multidetector CT imaging of the abdomen and pelvis was performed following the standard protocol without IV contrast. RADIATION DOSE REDUCTION: This exam was performed according to the departmental dose-optimization program which includes automated exposure control, adjustment of the mA and/or kV according to patient size and/or use of iterative reconstruction technique. COMPARISON:  10/27/2023. FINDINGS: Lower chest: Heart is enlarged and multi-vessel coronary artery calcifications are noted. Consolidation and patchy airspace disease is present at the lung bases bilaterally. Hepatobiliary: No focal liver abnormality is seen. Stones are present within the gallbladder. No biliary ductal dilatation. Pancreas: Unremarkable. No pancreatic ductal dilatation or surrounding inflammatory changes. Spleen: Normal in size without focal abnormality. Adrenals/Urinary Tract: The adrenal glands are within normal limits. Calcifications are present in the upper pole of the left kidney. There is a cyst in the lower pole of the right kidney. No hydronephrosis bilaterally. The bladder is unremarkable. Stomach/Bowel: The stomach is within normal limits. No bowel obstruction or pneumatosis is seen. Right hemicolectomy changes are noted. There is diffuse colonic wall  thickening. Scattered diverticula are present along the colon. A few small foci of free air are noted in the mesentery in the mid left abdomen, slightly increased from the previous exam. There is suggestion of contrast extravasation in the region. There is fine no definite abscess is seen. Vascular/Lymphatic: Aortic atherosclerosis. No enlarged abdominal or pelvic lymph nodes. Reproductive: No acute abnormality. Other: Mild ascites is noted in all 4 quadrants. A fat containing umbilical hernia is noted. There is mild anasarca. Musculoskeletal: The bony structures are stable. IMPRESSION: 1. Worsening diverticulitis of the descending colon with perforation and increasing free air and suspected oral contrast extravasation in the mid left abdomen. No definite abscess is seen. Surgical consultation is recommended. 2. Mild ascites. 3. Diffuse colonic wall thickening which may be due to associated inflammatory changes. 4. Patchy airspace disease at the lung bases with bilateral lower lobe consolidation. 5. Cholelithiasis. 6. Aortic atherosclerosis. Critical Value/emergent results were called by telephone at the time of interpretation on 10/31/2023 at 11:59 pm to provider Dr. Charlton, who verbally acknowledged these results. Electronically Signed   By: Leita Birmingham M.D.   On: 10/31/2023 23:59   CT ABDOMEN PELVIS WO CONTRAST Result Date: 10/27/2023 CLINICAL DATA:  Acute nonlocalized abdominal pain. Chills and fever. Vomiting and diarrhea. EXAM: CT ABDOMEN AND PELVIS WITHOUT CONTRAST TECHNIQUE: Multidetector CT imaging of the abdomen and pelvis was performed following the standard protocol without IV contrast. RADIATION DOSE REDUCTION: This exam was performed according to the departmental dose-optimization program which includes automated exposure control, adjustment of the mA and/or kV according to patient size and/or use of iterative reconstruction technique. COMPARISON:  CT abdomen and pelvis 04/19/2021 FINDINGS: Lower  chest: Bibasilar atelectasis.  No acute abnormality. Hepatobiliary: Cholelithiasis without evidence of acute cholecystitis. No biliary dilation. Unremarkable noncontrast appearance of the liver. Pancreas: Unremarkable. Spleen: Unremarkable. Adrenals/Urinary Tract: Normal adrenal glands. No urinary calculi or hydronephrosis. Unremarkable bladder. Stomach/Bowel: Stomach is within normal limits. No bowel obstruction. Colonic diverticulosis. Wall thickening, inflammatory stranding, and free fluid about the descending colon. Loosely organized fluid and gas about the inflamed descending colon (circa series 3/image [id number])  compatible with contained perforation. The adjacent jejunum is inflamed. Postoperative change about the colon with ileocolonic anastomosis in the right upper quadrant. Vascular/Lymphatic: Aortic atherosclerosis. No enlarged abdominal or pelvic lymph nodes. Reproductive: No acute abnormality. Other: Fat containing periumbilical hernia. Musculoskeletal: No acute fracture. IMPRESSION: 1. Acute diverticulitis of the descending colon with contained perforation. 2. Inflamed jejunum adjacent to the perforated diverticulitis. 3.  Aortic Atherosclerosis (ICD10-I70.0). Critical Value/emergent results were called by telephone at the time of interpretation on 10/27/2023 at 9:22 pm to provider [facility] , who verbally acknowledged these results. Electronically Signed   By: Norman Gatlin M.D.   On: 10/27/2023 21:24      Signature  -   Lavada Stank M.D on 11/03/2023 at 11:27 AM   -  To page go to [email or web address]

## 2023-11-04 DIAGNOSIS — Z7189 Other specified counseling: Secondary | ICD-10-CM | POA: Diagnosis not present

## 2023-11-04 DIAGNOSIS — K5732 Diverticulitis of large intestine without perforation or abscess without bleeding: Secondary | ICD-10-CM | POA: Diagnosis not present

## 2023-11-04 DIAGNOSIS — Z515 Encounter for palliative care: Secondary | ICD-10-CM | POA: Diagnosis not present

## 2023-11-04 LAB — CBC WITH DIFFERENTIAL/PLATELET
Abs Immature Granulocytes: 1.65 10*3/uL — ABNORMAL HIGH (ref 0.00–0.07)
Basophils Absolute: 0.1 10*3/uL (ref 0.0–0.1)
Basophils Relative: 0 %
Eosinophils Absolute: 0.1 10*3/uL (ref 0.0–0.5)
Eosinophils Relative: 1 %
HCT: 20.7 % — ABNORMAL LOW (ref 36.0–46.0)
Hemoglobin: 6.9 g/dL — CL (ref 12.0–15.0)
Immature Granulocytes: 9 %
Lymphocytes Relative: 5 %
Lymphs Abs: 0.9 10*3/uL (ref 0.7–4.0)
MCH: 25.9 pg — ABNORMAL LOW (ref 26.0–34.0)
MCHC: 33.3 g/dL (ref 30.0–36.0)
MCV: 77.8 fL — ABNORMAL LOW (ref 80.0–100.0)
Monocytes Absolute: 1.9 10*3/uL — ABNORMAL HIGH (ref 0.1–1.0)
Monocytes Relative: 10 %
Neutro Abs: 14.4 10*3/uL — ABNORMAL HIGH (ref 1.7–7.7)
Neutrophils Relative %: 75 %
Platelets: 317 10*3/uL (ref 150–400)
RBC: 2.66 MIL/uL — ABNORMAL LOW (ref 3.87–5.11)
RDW: 14.6 % (ref 11.5–15.5)
WBC: 19 10*3/uL — ABNORMAL HIGH (ref 4.0–10.5)
nRBC: 0 % (ref 0.0–0.2)

## 2023-11-04 LAB — GLUCOSE, CAPILLARY
Glucose-Capillary: 237 mg/dL — ABNORMAL HIGH (ref 70–99)
Glucose-Capillary: 237 mg/dL — ABNORMAL HIGH (ref 70–99)
Glucose-Capillary: 160 mg/dL — ABNORMAL HIGH (ref 70–99)
Glucose-Capillary: 165 mg/dL — ABNORMAL HIGH (ref 70–99)

## 2023-11-04 LAB — BRAIN NATRIURETIC PEPTIDE: B Natriuretic Peptide: 74.4 pg/mL (ref 0.0–100.0)

## 2023-11-04 LAB — RENAL FUNCTION PANEL
Albumin: 1.8 g/dL — ABNORMAL LOW (ref 3.5–5.0)
Anion gap: 22 — ABNORMAL HIGH (ref 5–15)
BUN: 81 mg/dL — ABNORMAL HIGH (ref 8–23)
CO2: 20 mmol/L — ABNORMAL LOW (ref 22–32)
Calcium: 7.4 mg/dL — ABNORMAL LOW (ref 8.9–10.3)
Chloride: 96 mmol/L — ABNORMAL LOW (ref 98–111)
Creatinine, Ser: 9.18 mg/dL — ABNORMAL HIGH (ref 0.44–1.00)
GFR, Estimated: 4 mL/min — ABNORMAL LOW (ref >60)
Glucose, Bld: 224 mg/dL — ABNORMAL HIGH (ref 70–99)
Phosphorus: 7 mg/dL — ABNORMAL HIGH (ref 2.5–4.6)
Potassium: 3.9 mmol/L (ref 3.5–5.1)
Sodium: 136 mmol/L (ref 135–145)

## 2023-11-04 LAB — PREPARE RBC (CROSSMATCH)

## 2023-11-04 LAB — PROCALCITONIN: Procalcitonin: 42.39 ng/mL

## 2023-11-04 LAB — MAGNESIUM: Magnesium: 2.3 mg/dL (ref 1.7–2.4)

## 2023-11-04 LAB — C-REACTIVE PROTEIN: CRP: 24.8 mg/dL — ABNORMAL HIGH (ref <1.0)

## 2023-11-04 LAB — HEPARIN LEVEL (UNFRACTIONATED): Heparin Unfractionated: 0.49 [IU]/mL (ref 0.30–0.70)

## 2023-11-04 MED ORDER — ANTICOAGULANT SODIUM CITRATE 4% (200MG/5ML) IV SOLN: 5.0000 mL | Status: DC | PRN

## 2023-11-04 MED ORDER — HEPARIN SODIUM (PORCINE) 1000 UNIT/ML DIALYSIS: 1000.0000 [IU] | INTRAMUSCULAR | Status: DC | PRN

## 2023-11-04 MED ORDER — PANTOPRAZOLE SODIUM 40 MG IV SOLR
40.0000 mg | Freq: Two times a day (BID) | INTRAVENOUS | Status: DC
  Administered 2023-11-04 – 2023-11-14 (×22): 40 mg via INTRAVENOUS
  Filled 2023-11-04 (×22): qty 10

## 2023-11-04 MED ORDER — ALTEPLASE 2 MG IJ SOLR: 2.0000 mg | Freq: Once | INTRAMUSCULAR | Status: DC | PRN

## 2023-11-04 MED ORDER — SODIUM CHLORIDE 0.9% IV SOLUTION: Freq: Once | INTRAVENOUS | Status: DC

## 2023-11-04 MED ORDER — HEPARIN SODIUM (PORCINE) 1000 UNIT/ML IJ SOLN
INTRAMUSCULAR | Status: AC
  Administered 2023-11-04: 2800 [IU]
  Filled 2023-11-04: qty 3

## 2023-11-04 NOTE — Progress Notes (Addendum)
 2 Days Post-Op  Subjective: No significant complaints this morning.  No bowel function yet.  NGT still in place with 1000cc yesterday.  PT/OT ordered   Objective: Vital signs in last 24 hours: Temp:  [97.7 F (36.5 C)-98.6 F (37 C)] 98 F (36.7 C) (02/10 0701) Pulse Rate:  [80-86] 80 (02/10 0401) Resp:  [13-20] 15 (02/09 1932) BP: (117-129)/(50-61) 129/54 (02/10 0401) SpO2:  [94 %-99 %] 99 % (02/10 0401) Weight:  [99.3 kg] 99.3 kg (02/10 0548) Last BM Date : 10/31/23  Intake/Output from previous day: 02/09 0701 - 02/10 0700 In: 1607.1 [I.V.:848.8; Blood:368; IV Piggyback:390.3] Out: 1050 [Emesis/NG output:1000; Stool:50] Intake/Output this shift: No intake/output data recorded.  PE: Gen:  NAD Abd: soft, appropriately tender, midline wound overall clean, some stained tissue on inferior aspect of wound and some necrotic adipose at base of inferior aspect, but fascia intact.  Colostomy with opaque bag so can't see stoma.  No air or output in bag  Lab Results:  Recent Labs    11/03/23 0310 11/04/23 0209  WBC 14.2* 19.0*  HGB 7.8* 6.9*  HCT 23.5* 20.7*  PLT 313 317   BMET Recent Labs    11/02/23 0133 11/03/23 0310  NA 132* 133*  K 4.2 4.0  CL 97* 95*  CO2 17* 19*  GLUCOSE 212* 193*  BUN 94* 65*  CREATININE 9.20* 7.34*  CALCIUM  7.9* 7.5*   PT/INR No results for input(s): "LABPROT", "INR" in the last 72 hours. CMP     Component Value Date/Time   NA 133 (L) 11/03/2023 0310   NA 137 02/28/2021 1548   K 4.0 11/03/2023 0310   CL 95 (L) 11/03/2023 0310   CO2 19 (L) 11/03/2023 0310   GLUCOSE 193 (H) 11/03/2023 0310   BUN 65 (H) 11/03/2023 0310   BUN 33 (H) 02/28/2021 1548   CREATININE 7.34 (H) 11/03/2023 0310   CREATININE 3.38 (H) 12/26/2015 1347   CALCIUM  7.5 (L) 11/03/2023 0310   PROT 6.0 (L) 11/02/2023 0133   PROT 7.0 11/07/2017 1414   ALBUMIN  2.1 (L) 11/03/2023 0310   ALBUMIN  4.0 11/07/2017 1414   AST 8 (L) 11/02/2023 0133   ALT 9 11/02/2023 0133    ALKPHOS 29 (L) 11/02/2023 0133   BILITOT 1.2 11/02/2023 0133   BILITOT 0.2 11/07/2017 1414   GFRNONAA 6 (L) 11/03/2023 0310   GFRNONAA 13 (L) 01/10/2015 1428   GFRAA 21 (L) 12/21/2019 1802   GFRAA 16 (L) 01/10/2015 1428   Lipase     Component Value Date/Time   LIPASE 26 10/27/2023 1936    Studies/Results: DG Chest Port 1 View Result Date: 11/03/2023 CLINICAL DATA:  Shortness of breath EXAM: PORTABLE CHEST - 1 VIEW COMPARISON:  11/11/2018 FINDINGS: Unchanged mild cardiomegaly and pulmonary vascular congestion. Interval worsening of left basilar opacity likely due to atelectasis. Lungs otherwise clear. Examination is limited due to expiratory phase of imaging. Interval placement of temporary right IJ hemodialysis catheter which terminates in the region of the right atrium. Nasogastric tube terminates in the left upper quadrant. IMPRESSION: Interval worsening of left basilar opacity which is likely due to atelectasis. Electronically Signed   By: Elester Grim M.D.   On: 11/03/2023 07:22    Anti-infectives: Anti-infectives (From admission, onward)    Start     Dose/Rate Route Frequency Ordered Stop   11/01/23 1415  piperacillin -tazobactam (ZOSYN ) IVPB 2.25 g        2.25 g 100 mL/hr over 30 Minutes Intravenous Every 8 hours  11/01/23 1316 11/06/23 1359   10/29/23 1100  piperacillin -tazobactam (ZOSYN ) IVPB 2.25 g  Status:  Discontinued        2.25 g 100 mL/hr over 30 Minutes Intravenous Every 8 hours 10/29/23 1006 11/01/23 1016   10/28/23 0600  piperacillin -tazobactam (ZOSYN ) IVPB 2.25 g  Status:  Discontinued        2.25 g 100 mL/hr over 30 Minutes Intravenous Every 8 hours 10/27/23 2224 10/27/23 2235   10/28/23 0600  ceFEPIme  (MAXIPIME ) 1 g in sodium chloride  0.9 % 100 mL IVPB  Status:  Discontinued        1 g 200 mL/hr over 30 Minutes Intravenous Every 24 hours 10/27/23 2235 10/29/23 0954   10/28/23 0600  metroNIDAZOLE  (FLAGYL ) IVPB 500 mg  Status:  Discontinued        500 mg 100  mL/hr over 60 Minutes Intravenous Every 12 hours 10/27/23 2235 10/29/23 0954   10/27/23 2130  piperacillin -tazobactam (ZOSYN ) IVPB 3.375 g        3.375 g 100 mL/hr over 30 Minutes Intravenous  Once 10/27/23 2128 10/27/23 2320        Assessment/Plan  POD 2, s/p Hartmann's by Dr. Leighton Punches 11/02/23 forSigmoid diverticulitis with perforation  -cont abx therapy for 4 days post op -cont NGT given high output and no colostomy function -hgb down to 6.9.  transfusion per primary.  No evidence of bleeding from NGT or colostomy. -therapies for mobilization. -monitor labs.  WBC up to 19K today, but just 2 days out from surgery and overall stable.  Continue to follow -VAC to midline wound  -WOC for colostomy care  FEN: NPO/NGT/IVFs VTE: heparin  gtt - hasn't been started post op, cont to hold due to anemia ID: cefepime /flagyl  2/3>2/4; Zosyn  2/4>>   - per TR[facility] -  PAF  HTN HLD AKI on CKD stage V - on HD CAD T2DM    LOS: 8 days    [name] , [facility] 11/04/2023, 9:05 AM Please see Amion for pager number during day hours 7:00am-4:30pm

## 2023-11-04 NOTE — Consult Note (Signed)
 WOC Nurse ostomy consult note Stoma type/location: LMQ colostomy Stomal assessment/size: 1"  budded but in a circumferential valley due to creasing.   Peristomal assessment: creasing, os opens at 3 o'clock.  Leaked over the weekend. Will need convex pouching. Skin intact  midline incision present Treatment options for stomal/peristomal skin: barrier ring and 1 piece convex pouch Output none at this time.  NG tube in place.  Ostomy pouching: 1pc.convex with barrier ring .  Cut pouch off center to accommodate midline incision/potential NPWT application Education provided: Patient in bed.  Groggy but attentive.  Patient lives with son but states she will manage her pouch independently.  We discuss twice weekly pouch changes. Patient states she takes tub baths instead of shower.  We discuss the pouch being water resistant, but may get a shorter wear time submerging in tub daily.  She understands.  No stool production yet.  Will continue teaching.  We discuss outpatient ostomy clinic.  Enrolled patient in [employer] DC program: No  next visit.   WOC Nurse Consult Note: Reason for Consult:midline incision Wound type: surgical Pressure Injury POA: NA Measurement: 22 cm x 7 cm x 4 cm  Wound bed: pale pink nongranulating Drainage (amount, consistency, odor) distal end wound base with brown tinged effluent.  Dr Melton Squires at bedside and is aware.  Will wait on NPWT (VAC) therapy today.  Supplies are ordered via [employer] operational officer however.  Periwound:  Dressing procedure/placement/frequency: NS moist gauze today.  Will reassess with Loetta Ringer, [place] tomorrow.  Will follow.  Branda Cain MSN, RN, FNP-BC CWON Wound, Ostomy, Continence Nurse Outpatient [facility] [phone] Pager [phone]

## 2023-11-04 NOTE — Progress Notes (Signed)
 Medicated twice for pain with relief noted. 1 unit of blood given for hemaglobin of 6.9. No reactions noted. Pt remains NPO. NG to LWS intact. Foley, no urine.

## 2023-11-04 NOTE — Progress Notes (Signed)
 Patient ID: [name], female   DOB: [birth year], 68 y.o.   MRN: [id number] [facility] Progress Note   Assessment/ Plan:   1.  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  After initial refusal of wanting to pursue renal replacement therapy, she changed her mind and asked to start dialysis with the intention of undergoing surgery for contained perforation of acute diverticulitis.  She has had 2 dialysis treatments so far and is on schedule for her third treatment again today via right IJ temporary dialysis catheter.  The plan is to have her converted to a tunneled catheter after she has resolution of leukocytosis and shows clinical improvement.  Postpone outpatient dialysis unit placement efforts at this time. 2.  Acute diverticulitis with contained perforation of descending colon: On Zosyn  and now status post exploratory laparotomy with Hartman's resection for perforated diverticulitis/peritonitis yesterday (2/8).  Surgical note reviewed from today along with wound assessment. 3.  Anion gap metabolic acidosis: Likely secondary to combination of acute kidney injury and ongoing infection, monitor with dialysis. 4.  Anemia: Likely secondary to chronic illness, iron  stores significantly low and yesterday started on intravenous iron  for loading prior to starting ESA.  She is now status post PRBC transfusion with acute hemoglobin drop seen overnight.  Subjective:   Reports to be in some pain as expected postoperatively.  Discomfort from NGT.  Status post PRBC transfusion.   Objective:   BP (!) 109/50   Pulse 82   Temp 98 F (36.7 C) (Oral)   Resp 12   Ht 5' 7.01" (1.702 m)   Wt 99.3 kg   SpO2 96%   BMI 34.28 kg/m   Intake/Output Summary (Last 24 hours) at 11/04/2023 1030 Last data filed at 11/04/2023 0550 Gross per 24 hour  Intake 1607.07 ml   Output 1050 ml  Net 557.07 ml   Weight change: -1.2 kg  Physical Exam: Gen: Uncomfortably resting in bed, NGT in place with LIS and bilious drainage CVS: Pulse regular rhythm, normal rate, S1 and S2 normal Resp: Clear to auscultation bilaterally with diminished breath sounds over bases, no rales Abd: Protuberant with colostomy bag in situ.  (Wound assessment noted on surgical note) Ext: Trace ankle edema.  No asterixis/myoclonic jerks noted  Imaging: DG Chest Port 1 View Result Date: 11/03/2023 CLINICAL DATA:  Shortness of breath EXAM: PORTABLE CHEST - 1 VIEW COMPARISON:  11/11/2018 FINDINGS: Unchanged mild cardiomegaly and pulmonary vascular congestion. Interval worsening of left basilar opacity likely due to atelectasis. Lungs otherwise clear. Examination is limited due to expiratory phase of imaging. Interval placement of temporary right IJ hemodialysis catheter which terminates in the region of the right atrium. Nasogastric tube terminates in the left upper quadrant. IMPRESSION: Interval worsening of left basilar opacity which is likely due to atelectasis. Electronically Signed   By: Elester Grim M.D.   On: 11/03/2023 07:22    Labs: BMET Recent Labs  Lab 10/29/23 0725 10/30/23 0501 10/31/23 0503 11/01/23 0532 11/02/23 0133 11/03/23 0310  NA 138 135 134* 129* 132* 133*  K 4.4 4.4 4.1 4.6 4.2 4.0  CL 103 102 102 97* 97* 95*  CO2 19* 18* 18* 14* 17* 19*  GLUCOSE 178* 209* 152* 236* 212* 193*  BUN 95* 101* 100* 106* 94* 65*  CREATININE 8.43* 9.26* 9.58* 10.48* 9.20* 7.34*  CALCIUM  8.0* 7.9* 7.6* 7.6* 7.9* 7.5*  PHOS 7.1* 6.8* 6.1*  7.6*  --  6.5*   CBC Recent Labs  Lab 11/01/23 0532 11/02/23 0133 11/03/23 0310 11/04/23 0209  WBC 12.0* 16.0* 14.2* 19.0*  NEUTROABS  --   --  10.4* 14.4*  HGB 9.0* 8.5* 7.8* 6.9*  HCT 27.6* 26.5* 23.5* 20.7*  MCV 79.8* 80.1 78.6* 77.8*  PLT 384 373 313 317    Medications:     sodium chloride    Intravenous Once   sodium chloride     Intravenous Once   Chlorhexidine  Gluconate Cloth  6 each Topical Q0600   insulin  aspart  0-6 Units Subcutaneous TID WC   lidocaine -EPINEPHrine  (PF)  10 mL Infiltration Once   mouth rinse  15 mL Mouth Rinse 4 times per day   pantoprazole  (PROTONIX ) IV  40 mg Intravenous Q12H   sodium chloride  flush  10-40 mL Intracatheter Q12H    Clevester Dally, MD 11/04/2023, 10:30 AM

## 2023-11-04 NOTE — Progress Notes (Signed)
 PROGRESS NOTE        PATIENT DETAILS Name: [name] Age: 68 y.o. Sex: female Date of Birth: Jan 03, 1956 Admit Date: 10/27/2023 Admitting Physician Arnulfo Larch, MD ZOX:[id number], Levell Reach, NP  Brief Summary: Patient is a 68 y.o.  female with history of CKD 5, A-fib on Eliquis , CAD-who presented with abdominal pain-found to have acute diverticulitis with contained perforation involving the descending colon.  Hospital course complicated by worsening AKI-thought to have progressed to ESRD-requiring initiation of HD.  See below for further details.  Significant events: 2/2>> admit to [facility] 2/6>> CT abdomen worsening diverticulitis with perforation 2/7>> transitioned to full comfort measures-as with worsening AKI-however multiple family members arrived-comfort care revoked-now full code-full scope of treatment including HD/laparotomy with colostomy.  General surgery/nephrology reconsulted.  IR placed [facility] and patient underwent first hemodialysis.  2/8>> remains n.p.o-colectomy/colostomy later today.  Significant studies: 2/2>> CT abdomen/pelvis: Acute diverticulitis-descending colon with contained perforation 2/6>> CT abdomen/pelvis: Worsening diverticulitis of descending colon with perforation and increasing failure-suspected oral contrast extravasation left abdomen.  Significant microbiology data: 2/2>> COVID/influenza/RSV PCR: Negative  Procedures: 2/8>> by Dr. Leighton Punches underwent exploratory laparotomy with Hartman's resection for perforated diverticulitis with diffuse peritonitis, colostomy in place.  Consults: Surgery Renal Palliative IR  Subjective: Patient in bed denies any headache, no fever chills, no chest pain or shortness of breath, some dull abdominal ache postop, no focal weakness, no nausea  Objective: Vitals: Blood pressure (!) 109/50, pulse 82, temperature 98 F (36.7 C), temperature source Oral, resp. rate 12, height 5' 7.01" (1.702 m),  weight 99.3 kg, SpO2 96%.   Exam:  Awake Alert, No new F.N deficits, NG-tube in place to intermittent suction, dark bilious secretion, colostomy bag in place, right IJ HD catheter, Foley catheter  [place].AT,[name], [name],   Symmetrical Chest wall movement, Good air movement bilaterally, CTAB RRR,No Gallops, Rubs or new Murmurs,  +ve B.Sounds, Abd Soft, No tenderness,   No Cyanosis, Clubbing or edema    Assessment/Plan:  Acute diverticulitis with contained perforation of the descending colon Initially was managed conservatively, subsequently 2/8>> by Dr. Leighton Punches general surgery she underwent exploratory laparotomy with Hartman's resection for perforated diverticulitis with diffuse peritonitis, colostomy in place, continue Zosyn , continue NG tube, defer further management of this issue to General surgery Case discussed with general surgery team on 11/04/2023.  AKI on CKD 5 with progression to ESRD AKI likely hemodynamically mediated-has now likely progressed to ESRD.  HD initiated via right IJ temporary catheter.  CAD Currently no anginal symptoms Suspect not on antiplatelets as on anticoagulation with Eliquis  Continue beta-blocker On Repatha injections as an outpatient.  PAF Sinus rhythm Continue Coreg  Resume IV heparin  when okay with surgery-Eliquis  obviously on hold.  Anemia of acute illness  Brought on by acute illness, underlying renal insufficiency now developing to ESRD, surgical blood loss, gradually trending down, 2 units of packed RBC on 11/04/2023, on IV PPI continue to monitor.  HTN BP stable-continue Coreg /amlodipine .  DM-2 (A1c 6.6 on 2/3) CBGs relatively stable on SSI-follow/optimize as diet get started back on.  Recent Labs    11/03/23 1535 11/03/23 2144 11/04/23 0807  GLUCAP 220* 221* 237*    Palliative care/goals of care Initially DNR-and wanted to do comfort care, now is full code wants to undergo aggressive treatment including surgery and  dialysis  Class 1 Obesity: Estimated body mass index  is 34.28 kg/m as calculated from the following:   Height as of this encounter: 5' 7.01" (1.702 m).   Weight as of this encounter: 99.3 kg.    Code status:   Code Status: Full Code   DVT Prophylaxis:  IV Heparin   Family CommunicationJeralyn [phone] updated 2/7  Disposition Plan: Status is: Inpatient Remains inpatient appropriate because: Severity of illness   Planned Discharge Destination:Residential hospice   Diet: Diet Order             Diet NPO time specified Except for: Sips with Meds  Diet effective now                   MEDICATIONS: Scheduled Meds:  sodium chloride    Intravenous Once   sodium chloride    Intravenous Once   Chlorhexidine  Gluconate Cloth  6 each Topical Q0600   insulin  aspart  0-6 Units Subcutaneous TID WC   lidocaine -EPINEPHrine  (PF)  10 mL Infiltration Once   mouth rinse  15 mL Mouth Rinse 4 times per day   pantoprazole  (PROTONIX ) IV  40 mg Intravenous Q12H   sodium chloride  flush  10-40 mL Intracatheter Q12H   Continuous Infusions:  iron  sucrose Stopped (11/03/23 1317)   piperacillin -tazobactam (ZOSYN )  IV 2.25 g (11/04/23 0554)   PRN Meds:.acetaminophen  **OR** acetaminophen , albuterol , hydrALAZINE , HYDROmorphone  (DILAUDID ) injection, menthol -cetylpyridinium, ondansetron  (ZOFRAN ) IV, mouth rinse   I have personally reviewed following labs and imaging studies  LABORATORY DATA:   Data Review:    Recent Labs  Lab 10/31/23 0503 11/01/23 0532 11/02/23 0133 11/03/23 0310 11/04/23 0209  WBC 13.7* 12.0* 16.0* 14.2* 19.0*  HGB 7.4* 9.0* 8.5* 7.8* 6.9*  HCT 22.7* 27.6* 26.5* 23.5* 20.7*  PLT 337 384 373 313 317  MCV 79.9* 79.8* 80.1 78.6* 77.8*  MCH 26.1 26.0 25.7* 26.1 25.9*  MCHC 32.6 32.6 32.1 33.2 33.3  RDW 14.8 14.7 14.7 14.7 14.6  LYMPHSABS  --   --   --  0.7 0.9  MONOABS  --   --   --  1.6* 1.9*  EOSABS  --   --   --  0.3 0.1  BASOSABS  --   --   --  0.1  0.1    Recent Labs  Lab 10/29/23 0725 10/30/23 0501 10/31/23 0503 11/01/23 0532 11/02/23 0133 11/03/23 0310 11/04/23 0209  NA 138 135 134* 129* 132* 133*  --   K 4.4 4.4 4.1 4.6 4.2 4.0  --   CL 103 102 102 97* 97* 95*  --   CO2 19* 18* 18* 14* 17* 19*  --   ANIONGAP 16* 15 14 18* 18* 19*  --   GLUCOSE 178* 209* 152* 236* 212* 193*  --   BUN 95* 101* 100* 106* 94* 65*  --   CREATININE 8.43* 9.26* 9.58* 10.48* 9.20* 7.34*  --   AST  --   --   --   --  8*  --   --   ALT  --   --   --   --  9  --   --   ALKPHOS  --   --   --   --  29*  --   --   BILITOT  --   --   --   --  1.2  --   --   ALBUMIN  2.8* 2.6* 2.4* 2.2* 2.0* 2.1*  --   CRP  --   --   --   --   --  22.7* 24.8*  PROCALCITON  --   --   --   --   --  41.53 42.39  TSH  --   --   --   --   --  1.046  --   BNP  --   --   --   --   --  78.5 74.4  MG  --   --   --   --   --  2.1 2.3  PHOS 7.1* 6.8* 6.1* 7.6*  --  6.5*  --   CALCIUM  8.0* 7.9* 7.6* 7.6* 7.9* 7.5*  --       Recent Labs  Lab 10/30/23 0501 10/31/23 0503 11/01/23 0532 11/02/23 0133 11/03/23 0310 11/04/23 0209  CRP  --   --   --   --  22.7* 24.8*  PROCALCITON  --   --   --   --  41.53 42.39  TSH  --   --   --   --  1.046  --   BNP  --   --   --   --  78.5 74.4  MG  --   --   --   --  2.1 2.3  CALCIUM  7.9* 7.6* 7.6* 7.9* 7.5*  --     --------------------------------------------------------------------------------------------------------------- Lab Results  Component Value Date   CHOL 134 12/21/2019   HDL 49 12/21/2019   LDLCALC 69 12/21/2019   TRIG 82 12/21/2019   CHOLHDL 2.7 12/21/2019    Lab Results  Component Value Date   HGBA1C 6.6 (H) 10/28/2023   Recent Labs    11/03/23 0310  TSH 1.046   No results for input(s): "VITAMINB12", "FOLATE", "FERRITIN", "TIBC", "IRON ", "RETICCTPCT" in the last 72 hours. ------------------------------------------------------------------------------------------------------------------ Cardiac Enzymes No  results for input(s): "CKMB", "TROPONINI", "MYOGLOBIN" in the last 168 hours.  Invalid input(s): "CK"  Micro Results Recent Results (from the past 240 hours)  Resp panel by RT-PCR (RSV, Flu A&B, Covid) Anterior Nasal Swab     Status: None   Collection Time: 10/27/23  7:18 PM   Specimen: Anterior Nasal Swab  Result Value Ref Range Status   SARS Coronavirus 2 by RT PCR NEGATIVE NEGATIVE Final   Influenza A by PCR NEGATIVE NEGATIVE Final   Influenza B by PCR NEGATIVE NEGATIVE Final    Comment: (NOTE) The Xpert Xpress SARS-CoV-2/FLU/RSV plus assay is intended as an aid in the diagnosis of influenza from Nasopharyngeal swab specimens and should not be used as a sole basis for treatment. Nasal washings and aspirates are unacceptable for Xpert Xpress SARS-CoV-2/FLU/RSV testing.  Fact Sheet for Patients: BloggerCourse.com  Fact Sheet for Healthcare Providers: SeriousBroker.it  This test is not yet approved or cleared by the [place]  FDA and has been authorized for detection and/or diagnosis of SARS-CoV-2 by FDA under an Emergency Use Authorization (EUA). This EUA will remain in effect (meaning this test can be used) for the duration of the COVID-19 declaration under Section 564(b)(1) of the Act, 21 U.S.C. section 360bbb-3(b)(1), unless the authorization is terminated or revoked.     Resp Syncytial Virus by PCR NEGATIVE NEGATIVE Final    Comment: (NOTE) Fact Sheet for Patients: BloggerCourse.com  Fact Sheet for Healthcare Providers: SeriousBroker.it  This test is not yet approved or cleared by the [place]  FDA and has been authorized for detection and/or diagnosis of SARS-CoV-2 by FDA under an Emergency Use Authorization (EUA). This EUA will remain in effect (meaning this test can be used) for the duration of the COVID-19  declaration under Section 564(b)(1) of the Act, 21  U.S.C. section 360bbb-3(b)(1), unless the authorization is terminated or revoked.  Performed at [facility], [address] [place], [place] [address]     Radiology Reports DG Chest Page 1 View Result Date: 11/03/2023 CLINICAL DATA:  Shortness of breath EXAM: PORTABLE CHEST - 1 VIEW COMPARISON:  11/11/2018 FINDINGS: Unchanged mild cardiomegaly and pulmonary vascular congestion. Interval worsening of left basilar opacity likely due to atelectasis. Lungs otherwise clear. Examination is limited due to expiratory phase of imaging. Interval placement of temporary right IJ hemodialysis catheter which terminates in the region of the right atrium. Nasogastric tube terminates in the left upper quadrant. IMPRESSION: Interval worsening of left basilar opacity which is likely due to atelectasis. Electronically Signed   By: Elester Grim M.D.   On: 11/03/2023 07:22   IR Fluoro Guide CV Line Right Result Date: 11/01/2023 INDICATION: ESRD requiring HD. EXAM: NON-TUNNELED CENTRAL VENOUS HEMODIALYSIS CATHETER PLACEMENT WITH ULTRASOUND AND FLUOROSCOPIC GUIDANCE COMPARISON:  CT AP 10/31/2023. MEDICATIONS: Local anesthetic was administered. FLUOROSCOPY TIME:  Fluoroscopic dose; 35 mGy COMPLICATIONS: None immediate. PROCEDURE: Informed written consent was obtained from the patient and/or patient's representative after a discussion of the risks, benefits, and alternatives to treatment. Questions regarding the procedure were encouraged and answered. The RIGHT neck and chest were prepped with chlorhexidine  in a sterile fashion, and a sterile drape was applied covering the operative field. Maximum barrier sterile technique with sterile gowns and gloves were used for the procedure. A timeout was performed prior to the initiation of the procedure. After the overlying soft tissues were anesthetized, a small venotomy incision was created and a micropuncture kit was utilized to access the external jugular vein. Real-time  ultrasound guidance was utilized for vascular access including the acquisition of a permanent ultrasound image documenting patency of the accessed vessel. The microwire was utilized to measure appropriate catheter length. A stiff glidewire was advanced to the level of the IVC. Under fluoroscopic guidance, the venotomy was serially dilated, ultimately allowing placement of a 20 cm temporary Trialysis catheter with tip ultimately terminating within the superior aspect of the right atrium. Final catheter positioning was confirmed and documented with a spot radiographic image. The catheter aspirates and flushes normally. The catheter was flushed with appropriate volume heparin  dwells. The catheter exit site was secured with a 2-0 Ethilon retention suture. A dressing was placed. The patient tolerated the procedure well without immediate post procedural complication. IMPRESSION: Successful placement of a RIGHT EXTERNAL jugular approach 20 cm non-tunneled dialysis catheter The tip of the catheter is positioned within the proximal RIGHT atrium. The catheter is ready for immediate use. PLAN: This catheter may be converted to a tunneled dialysis catheter at a later date as indicated. Art Largo, MD [facility] Electronically Signed   By: Art Largo M.D.   On: 11/01/2023 18:15   IR US  Guide Vasc Access Right Result Date: 11/01/2023 INDICATION: ESRD requiring HD. EXAM: NON-TUNNELED CENTRAL VENOUS HEMODIALYSIS CATHETER PLACEMENT WITH ULTRASOUND AND FLUOROSCOPIC GUIDANCE COMPARISON:  CT AP 10/31/2023. MEDICATIONS: Local anesthetic was administered. FLUOROSCOPY TIME:  Fluoroscopic dose; 35 mGy COMPLICATIONS: None immediate. PROCEDURE: Informed written consent was obtained from the patient and/or patient's representative after a discussion of the risks, benefits, and alternatives to treatment. Questions regarding the procedure were encouraged and answered. The RIGHT  neck and chest were prepped with chlorhexidine  in a sterile fashion, and a sterile drape was applied covering the operative field. Maximum barrier  sterile technique with sterile gowns and gloves were used for the procedure. A timeout was performed prior to the initiation of the procedure. After the overlying soft tissues were anesthetized, a small venotomy incision was created and a micropuncture kit was utilized to access the external jugular vein. Real-time ultrasound guidance was utilized for vascular access including the acquisition of a permanent ultrasound image documenting patency of the accessed vessel. The microwire was utilized to measure appropriate catheter length. A stiff glidewire was advanced to the level of the IVC. Under fluoroscopic guidance, the venotomy was serially dilated, ultimately allowing placement of a 20 cm temporary Trialysis catheter with tip ultimately terminating within the superior aspect of the right atrium. Final catheter positioning was confirmed and documented with a spot radiographic image. The catheter aspirates and flushes normally. The catheter was flushed with appropriate volume heparin  dwells. The catheter exit site was secured with a 2-0 Ethilon retention suture. A dressing was placed. The patient tolerated the procedure well without immediate post procedural complication. IMPRESSION: Successful placement of a RIGHT EXTERNAL jugular approach 20 cm non-tunneled dialysis catheter The tip of the catheter is positioned within the proximal RIGHT atrium. The catheter is ready for immediate use. PLAN: This catheter may be converted to a tunneled dialysis catheter at a later date as indicated. Art Largo, MD [facility] Electronically Signed   By: Art Largo M.D.   On: 11/01/2023 18:15   CT ABDOMEN PELVIS WO CONTRAST Result Date: 10/31/2023 CLINICAL DATA:  Diverticulitis, complication suspected. EXAM: CT ABDOMEN AND  PELVIS WITHOUT CONTRAST TECHNIQUE: Multidetector CT imaging of the abdomen and pelvis was performed following the standard protocol without IV contrast. RADIATION DOSE REDUCTION: This exam was performed according to the departmental dose-optimization program which includes automated exposure control, adjustment of the mA and/or kV according to patient size and/or use of iterative reconstruction technique. COMPARISON:  10/27/2023. FINDINGS: Lower chest: Heart is enlarged and multi-vessel coronary artery calcifications are noted. Consolidation and patchy airspace disease is present at the lung bases bilaterally. Hepatobiliary: No focal liver abnormality is seen. Stones are present within the gallbladder. No biliary ductal dilatation. Pancreas: Unremarkable. No pancreatic ductal dilatation or surrounding inflammatory changes. Spleen: Normal in size without focal abnormality. Adrenals/Urinary Tract: The adrenal glands are within normal limits. Calcifications are present in the upper pole of the left kidney. There is a cyst in the lower pole of the right kidney. No hydronephrosis bilaterally. The bladder is unremarkable. Stomach/Bowel: The stomach is within normal limits. No bowel obstruction or pneumatosis is seen. Right hemicolectomy changes are noted. There is diffuse colonic wall thickening. Scattered diverticula are present along the colon. A few small foci of free air are noted in the mesentery in the mid left abdomen, slightly increased from the previous exam. There is suggestion of contrast extravasation in the region. There is fine no definite abscess is seen. Vascular/Lymphatic: Aortic atherosclerosis. No enlarged abdominal or pelvic lymph nodes. Reproductive: No acute abnormality. Other: Mild ascites is noted in all 4 quadrants. A fat containing umbilical hernia is noted. There is mild anasarca. Musculoskeletal: The bony structures are stable. IMPRESSION: 1. Worsening diverticulitis of the descending colon  with perforation and increasing free air and suspected oral contrast extravasation in the mid left abdomen. No definite abscess is seen. Surgical consultation is recommended. 2. Mild ascites. 3. Diffuse colonic wall thickening which may be due to associated inflammatory changes. 4. Patchy airspace disease at the lung bases with bilateral lower lobe consolidation.  5. Cholelithiasis. 6. Aortic atherosclerosis. Critical Value/emergent results were called by telephone at the time of interpretation on 10/31/2023 at 11:59 pm to provider Dr. Brice Campi, who verbally acknowledged these results. Electronically Signed   By: Wyvonnia Heimlich M.D.   On: 10/31/2023 23:59   CT ABDOMEN PELVIS WO CONTRAST Result Date: 10/27/2023 CLINICAL DATA:  Acute nonlocalized abdominal pain. Chills and fever. Vomiting and diarrhea. EXAM: CT ABDOMEN AND PELVIS WITHOUT CONTRAST TECHNIQUE: Multidetector CT imaging of the abdomen and pelvis was performed following the standard protocol without IV contrast. RADIATION DOSE REDUCTION: This exam was performed according to the departmental dose-optimization program which includes automated exposure control, adjustment of the mA and/or kV according to patient size and/or use of iterative reconstruction technique. COMPARISON:  CT abdomen and pelvis 04/19/2021 FINDINGS: Lower chest: Bibasilar atelectasis.  No acute abnormality. Hepatobiliary: Cholelithiasis without evidence of acute cholecystitis. No biliary dilation. Unremarkable noncontrast appearance of the liver. Pancreas: Unremarkable. Spleen: Unremarkable. Adrenals/Urinary Tract: Normal adrenal glands. No urinary calculi or hydronephrosis. Unremarkable bladder. Stomach/Bowel: Stomach is within normal limits. No bowel obstruction. Colonic diverticulosis. Wall thickening, inflammatory stranding, and free fluid about the descending colon. Loosely organized fluid and gas about the inflamed descending colon (circa series 3/image 49-50) compatible with contained  perforation. The adjacent jejunum is inflamed. Postoperative change about the colon with ileocolonic anastomosis in the right upper quadrant. Vascular/Lymphatic: Aortic atherosclerosis. No enlarged abdominal or pelvic lymph nodes. Reproductive: No acute abnormality. Other: Fat containing periumbilical hernia. Musculoskeletal: No acute fracture. IMPRESSION: 1. Acute diverticulitis of the descending colon with contained perforation. 2. Inflamed jejunum adjacent to the perforated diverticulitis. 3.  Aortic Atherosclerosis (ICD10-I70.0). Critical Value/emergent results were called by telephone at the time of interpretation on 10/27/2023 at 9:22 pm to provider [facility] , who verbally acknowledged these results. Electronically Signed   By: Rozell Cornet M.D.   On: 10/27/2023 21:24      Signature  -   Lynnwood Sauer M.D on 11/04/2023 at 11:09 AM   -  To page go to [email or web address]

## 2023-11-04 NOTE — TOC Progression Note (Signed)
 Transition of Care [facility]) - Progression Note    Patient Details  Name: [name] MRN: [id number] Date of Birth: [birth year]  [facility] CM/SW Contact  Jannine Meo, RN Phone Number: 11/04/2023, 2:06 PM  Clinical Narrative:    Pt has NG tube, CIR following. Pt on dialysis and has colostomy.    Expected Discharge Plan: Hospice Medical Facility Barriers to Discharge: Hospice Bed not available  Expected Discharge Plan and Services In-house Referral: Clinical Social Work   Post Acute Care Choice: Residential Hospice Bed Living arrangements for the past 2 months: Single Family Home                                       Social Determinants of Health (SDOH) Interventions SDOH Screenings   Food Insecurity: No Food Insecurity (10/28/2023)  Housing: Low Risk  (10/28/2023)  Transportation Needs: No Transportation Needs (10/28/2023)  Utilities: Not At Risk (10/28/2023)  Depression (PHQ2-9): Low Risk  (02/28/2021)  Financial Resource Strain: Low Risk  (08/25/2021)   Received from [facility], [facility]  Physical Activity: Insufficiently Active (06/20/2021)   Received from [facility], [facility]  Social Connections: Socially Isolated (10/28/2023)  Stress: No Stress Concern Present (08/25/2021)   Received from [facility], [facility]  Tobacco Use: Low Risk  (11/02/2023)    Readmission Risk Interventions     No data to display

## 2023-11-04 NOTE — Plan of Care (Signed)

## 2023-11-04 NOTE — Progress Notes (Signed)
 PHARMACY - ANTICOAGULATION CONSULT NOTE  Pharmacy Consult:  Heparin  Indication: atrial fibrillation  Allergies  Allergen Reactions   Nsaids Other (See Comments)    CKD stage 4   Lisinopril Other (See Comments)    coughing   Peanut-Containing Drug Products Itching and Other (See Comments)    GI intolerance - diarrhea    Patient Measurements: Height: 5' 7.01" (170.2 cm) Weight: 99.3 kg (218 lb 14.7 oz) IBW/kg (Calculated) : 61.62 Heparin  Dosing Weight: 83 kg  Vital Signs: Temp: 98 F (36.7 C) (02/10 0701) Temp Source: Oral (02/10 0701) BP: 129/54 (02/10 0401) Pulse Rate: 80 (02/10 0401)  Labs: Recent Labs    11/02/23 0133 11/03/23 0310 11/03/23 1812 11/04/23 0209  HGB 8.5* 7.8*  --  6.9*  HCT 26.5* 23.5*  --  20.7*  PLT 373 313  --  317  HEPARINUNFRC  --   --  0.42 0.49  CREATININE 9.20* 7.34*  --   --     Estimated Creatinine Clearance: 8.9 mL/min (A) (by C-G formula based on SCr of 7.34 mg/dL (H)).  Medications:  PTA apixaban  2.5mg  PO BID (last dose 10/27/2023 AM)  Assessment: 28 yoF presents with abdominal pain with complicated diverticulitis w/ contained perforation and possible need for colectomy. PMH: CKD 5, A fib (apixaban ), CAD, and CVA.  Pharmacy consulted to dose IV heparin  for Afib. 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   Heparin  level was therapeutic (0.49) on infusion at 1800 units/hr this morning. Overnight Hgb dropped down to 6.9 and she received 1 unit of PRBC. No apparent GI bleeding noted, but heparin  is being held while she is evaluated for bleeding.    Goal of Therapy:  Heparin  level 0.3-0.7 units/ml Monitor platelets by anticoagulation protocol: Yes   Plan:  Hold heparin  for today Daily heparin  level and CBC Follow up potential sources of bleeding and ability to transition back to home apixaban  once stable  Homer Lust, PharmD 11/04/2023 7:32 AM

## 2023-11-04 NOTE — Plan of Care (Signed)
  Problem: Education: Goal: Ability to describe self-care measures that may prevent or decrease complications (Diabetes Survival Skills Education) will improve Outcome: Progressing   Problem: Coping: Goal: Ability to adjust to condition or change in health will improve Outcome: Progressing

## 2023-11-04 NOTE — Progress Notes (Signed)

## 2023-11-04 NOTE — Progress Notes (Signed)
   11/04/23 1734  Vitals  Temp 98 F (36.7 C)  Pulse Rate 82  Resp 16  BP (!) 163/77  SpO2 100 %  O2 Device Nasal Cannula  Oxygen Therapy  Patient Activity (if Appropriate) In bed  Pulse Oximetry Type Continuous  Post Treatment  Dialyzer Clearance Clear  Hemodialysis Intake (mL) 0 mL  Liters Processed 54  Fluid Removed (mL) 500 mL  Tolerated HD Treatment Yes  Post-Hemodialysis Comments Tx. tolerated well without difficulties. Pt. voiced no complaint thru out the tx. UF goal met and 1 unit of PRBC given. Report Call to 5W Bedside RN.   Received patient in bed to unit.  Alert and oriented.  Informed consent signed and in chart.   TX duration: 3  Patient tolerated well.  Transported back to the room  Alert, without acute distress.  Hand-off given to patient's nurse.   Access used: Yes Access issues: No  Total UF removed: 500 Medication(s) given: See MAR Post HD VS: See Above Grid Post HD weight: 96.8 kg   [facility]

## 2023-11-04 NOTE — Progress Notes (Signed)
 PT Cancellation Note  Patient Details Name: [name] MRN: [id number] DOB: 01-Mar-1956   Cancelled Treatment:    Reason Eval/Treat Not Completed: Medical issues which prohibited therapy (Per RN, pt HgB at 6.9 and will be receiving a second unit of blood at HD today. Requests PT to hold until tomorrow.)   [name] 11/04/2023, 9:18 AM

## 2023-11-04 NOTE — Progress Notes (Signed)
 Hgb is 6.9 this am. 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 Plan to transfuse 1 unit RBC and check post-transfusion H&H.

## 2023-11-04 NOTE — Op Note (Signed)
 Date: 11/02/2023  Patient: [name] MRN: [id number]  Preoperative Diagnosis: Perforated diverticulitis Postoperative Diagnosis: Same  Procedure: Exploratory laparotomy, left colectomy with mobilization of the splenic flexure, and end colostomy placement  Surgeon: Leonor Dawn, MD Assistant: Krystal Spinner, MD  EBL: 50 mL  Anesthesia: General endotracheal  Specimens: Left colon (stitch marks proximal margin) Additional proximal left colon  Indications: [name] is a 68 yo female who was admitted with abdominal pain and found to have diverticulitis with a perforation. She did not improve clinically on IV antibiotics, and a CT scan showed increased free air and intraabdominal fluid. After multiple discussions regarding goals of care, as well as the risks and benefits of surgery, she decided to proceed with sigmoid colectomy and colostomy.  Findings: Perforated diverticulitis of the descending colon with feculent peritonitis.   Procedure details: Informed consent was obtained in the preoperative area prior to the procedure. The patient was brought to the operating room and placed on the table in the supine position. General anesthesia was induced and appropriate lines and drains were placed for intraoperative monitoring. Perioperative antibiotics were administered per SCIP guidelines. The abdomen was prepped and draped in the usual sterile fashion. A pre-procedure timeout was taken verifying patient identity, surgical site and procedure to be performed.  A lower midline skin incision was made and extended superiorly above the umbilicus. The subcutaneous tissue was divided with cautery to expose the fascia, and the fascia was elevated and opened along the linea alba. There was turbid fluid on opening the peritoneum. The fascia was opened the full length of the incision. An Allexis wound protector and Bookwalter fixed retractor were placed. The small bowel was mildly inflamed with an  inflammatory rind, and inflammatory interloop adhesions. These adhesions were gently lysed using finger dissection to free up the small bowel loops. A large feculent collection was entered between the small bowel and descending colon, consistent with ongoing leakage from the colon. The small bowel was retracted to the right side of the abdomen to expose the left colon. There was significant inflammation of the descending colon, and the site of perforation appeared to be on the mid-descending colon. The distal descending and sigmoid colon were also thickened and inflamed, but the upper rectum was soft and normal in appearance. The left colon was mobilized along the line of Toldt using cautery and blunt finger dissection. The mesentery was somewhat thickened and foreshortened. The splenic flexure of the colon was taken down using blunt dissection and Ligasure. The omentum was separated from the distal transverse colon using Ligasure. Once the colon was mobilized, the colon was transected using a contour stapler with a blue load just distal to the splenic flexure, which was soft and normal in appearance. The mesentery of the descending and sigmoid colon was then divided, staying relatively close to the colon to avoid a ureteral injury, as the mesentery was fairly short. The inferior mesenteric vessels were divided with Ligasure. The mesentery was further divided to the upper rectum. The upper rectum was then transected with a contour stapler with a blue load. The specimen was oriented and sent for routine pathology. The staple line on the upper rectum was marked with prolene suture. The abdomen was copiously irrigated with 4L warmed saline. The small bowel was run from the ligament of Treitz to the terminal ileum, and although there were some segments with secondary inflammation, there were no signs of injury or perforation.  Next the proximal stapled end of the colon was  brought to the abdominal wall. It did not  reach easily without tension due to some tethering at the mesentery. An additional approximately 4cm of colon was resected and the mesentery was divided with Ligasure. This additional segment was sent for routine pathology. This allowed the remaining colon to reach the abdominal wall without tension. It did not reach the marked stoma sites in the lower abdomen, thus a site on the LUQ abdominal wall was select for the colostomy. A circular skin incision was made and the subcutaneous tissue was divided with cautery to expose the fascia. The anterior rectus sheath was vertically incised with cautery, the rectus muscle was spread, and the posterior sheath was vertically incised. The stapled end of the colon was brought up through the incision to the skin, taking care not to twist the mesentery. The fascia was closed at midline with a running looped 1 PDS suture. The skin was left open and packed with a saline-moistened kerlex. The colostomy was matured in [place] fashion using 3-0 Vicryl sutures and an ostomy appliance was applied.  Upon entering the abdomen (organ space), I encountered feculent peritonitis.  CASE DATA:  Type of patient?: DOW CASE (Surgical Hospitalist [facility])  Status of Case? URGENT Add On  Infection Present At Time Of Surgery (PATOS)?  FECULENT PERITONITIS   The patient tolerated the procedure with no apparent complications. All counts were correct x2 at the end of the procedure. The patient was extubated and taken to PACU in stable condition.  Leonor Dawn, MD

## 2023-11-05 DIAGNOSIS — K5732 Diverticulitis of large intestine without perforation or abscess without bleeding: Secondary | ICD-10-CM | POA: Diagnosis not present

## 2023-11-05 DIAGNOSIS — Z515 Encounter for palliative care: Secondary | ICD-10-CM | POA: Diagnosis not present

## 2023-11-05 DIAGNOSIS — Z7189 Other specified counseling: Secondary | ICD-10-CM | POA: Diagnosis not present

## 2023-11-05 LAB — CBC WITH DIFFERENTIAL/PLATELET
Abs Immature Granulocytes: 1.82 10*3/uL — ABNORMAL HIGH (ref 0.00–0.07)
Basophils Absolute: 0.1 10*3/uL (ref 0.0–0.1)
Basophils Relative: 0 %
Eosinophils Absolute: 0.1 10*3/uL (ref 0.0–0.5)
Eosinophils Relative: 1 %
HCT: 27.4 % — ABNORMAL LOW (ref 36.0–46.0)
Hemoglobin: 9.1 g/dL — ABNORMAL LOW (ref 12.0–15.0)
Immature Granulocytes: 7 %
Lymphocytes Relative: 4 %
Lymphs Abs: 1.1 10*3/uL (ref 0.7–4.0)
MCH: 26.5 pg (ref 26.0–34.0)
MCHC: 33.2 g/dL (ref 30.0–36.0)
MCV: 79.9 fL — ABNORMAL LOW (ref 80.0–100.0)
Monocytes Absolute: 2.1 10*3/uL — ABNORMAL HIGH (ref 0.1–1.0)
Monocytes Relative: 9 %
Neutro Abs: 19.5 10*3/uL — ABNORMAL HIGH (ref 1.7–7.7)
Neutrophils Relative %: 79 %
Platelets: 316 10*3/uL (ref 150–400)
RBC: 3.43 MIL/uL — ABNORMAL LOW (ref 3.87–5.11)
RDW: 15.4 % (ref 11.5–15.5)
WBC: 24.7 10*3/uL — ABNORMAL HIGH (ref 4.0–10.5)
nRBC: 0.1 % (ref 0.0–0.2)

## 2023-11-05 LAB — BASIC METABOLIC PANEL
Anion gap: 20 — ABNORMAL HIGH (ref 5–15)
BUN: 47 mg/dL — ABNORMAL HIGH (ref 8–23)
CO2: 21 mmol/L — ABNORMAL LOW (ref 22–32)
Calcium: 7.7 mg/dL — ABNORMAL LOW (ref 8.9–10.3)
Chloride: 94 mmol/L — ABNORMAL LOW (ref 98–111)
Creatinine, Ser: 6.1 mg/dL — ABNORMAL HIGH (ref 0.44–1.00)
GFR, Estimated: 7 mL/min — ABNORMAL LOW (ref >60)
Glucose, Bld: 194 mg/dL — ABNORMAL HIGH (ref 70–99)
Potassium: 3.9 mmol/L (ref 3.5–5.1)
Sodium: 135 mmol/L (ref 135–145)

## 2023-11-05 LAB — TYPE AND SCREEN
ABO/RH(D): A POS
Antibody Screen: NEGATIVE
Unit division: 0
Unit division: 0

## 2023-11-05 LAB — BPAM RBC
Blood Product Expiration Date: 202503092359
Blood Product Expiration Date: 202503102359
ISSUE DATE / TIME: 202502100356
ISSUE DATE / TIME: 202502101425
Unit Type and Rh: 6200
Unit Type and Rh: 6200

## 2023-11-05 LAB — SURGICAL PATHOLOGY

## 2023-11-05 LAB — PROCALCITONIN: Procalcitonin: 30.19 ng/mL

## 2023-11-05 LAB — GLUCOSE, CAPILLARY
Glucose-Capillary: 216 mg/dL — ABNORMAL HIGH (ref 70–99)
Glucose-Capillary: 203 mg/dL — ABNORMAL HIGH (ref 70–99)
Glucose-Capillary: 224 mg/dL — ABNORMAL HIGH (ref 70–99)
Glucose-Capillary: 216 mg/dL — ABNORMAL HIGH (ref 70–99)

## 2023-11-05 LAB — MAGNESIUM: Magnesium: 2.1 mg/dL (ref 1.7–2.4)

## 2023-11-05 LAB — BRAIN NATRIURETIC PEPTIDE: B Natriuretic Peptide: 179 pg/mL — ABNORMAL HIGH (ref 0.0–100.0)

## 2023-11-05 LAB — C-REACTIVE PROTEIN: CRP: 23.5 mg/dL — ABNORMAL HIGH (ref <1.0)

## 2023-11-05 MED ORDER — SODIUM CHLORIDE 0.9 % IV SOLN
200.0000 mg | INTRAVENOUS | Status: AC
  Administered 2023-11-06 – 2023-11-07 (×2): 200 mg via INTRAVENOUS
  Filled 2023-11-05 (×2): qty 10

## 2023-11-05 NOTE — Plan of Care (Signed)
Pt has rested quietly throughout the night with no distress noted. Alert and oriented. On room air. NG intact green drainage to intermittent LWS. Colostomy intact=- no drng. Foley intact to BSD. Medicated for pain twice with relief noted. Also medicated with cepacol with relief noted. Bath given. Linens changed and gown. Midline dressing changed as ordered. No other complaints voiced.     Problem: Education: Goal: Knowledge of General Education information will improve Description: Including pain rating scale, medication(s)/side effects and non-pharmacologic comfort measures Outcome: Progressing   Problem: Health Behavior/Discharge Planning: Goal: Ability to manage health-related needs will improve Outcome: Progressing   Problem: Clinical Measurements: Goal: Ability to maintain clinical measurements within normal limits will improve Outcome: Progressing   Problem: Coping: Goal: Level of anxiety will decrease Outcome: Progressing   Problem: Coping: Goal: Ability to identify and develop effective coping behavior will improve Outcome: Progressing   Problem: Role Relationship: Goal: Family's ability to cope with current situation will improve Outcome: Progressing Goal: Ability to verbalize concerns, feelings, and thoughts to partner or family member will improve Outcome: Progressing   Problem: Pain Management: Goal: Satisfaction with pain management regimen will improve Outcome: Progressing

## 2023-11-05 NOTE — Consult Note (Signed)
WOC Nurse Consult Note: Reason for Consult: placement of NPWT dressing Wound type:surgical  Pressure Injury POA: NA Measurement:17cm x 5cm x 4.5cm with skin bridge at the umbilicus Wound bed:100% pink, pale Drainage (amount, consistency, odor) scant, serosanguinous Periwound: intact, some yellow slough along the skin bridge  Dressing procedure/placement/frequency: Removed saline moist gauze dressing Placed VAC drape over skin bridge to protect skin. Added ostomy barrier ring at the skin bridge on each side from the wound edge, to fill in creasing and to aid in gap between edge of tape border of the ostomy pouch.  Added 1/2 of ostomy barrier along the distal aspect of the wound edge due to overhanging pannus.  2pc of black foam placed in the wound, sealed at . Patient tolerated well. Received IV pain meds prior to the dressing change.   Patient lives with 3 sons at home, one does not work but has a Producer, television/film/video disability.  Patient agreed oldest son may be the best to reach out to for decisions on ostomy teaching if needed.  CIR staff in the room when I arrived. Plans for CIR at DC.   1 extra dressing as bedside and several barrier rings. No ostomy pouches, I have ordered 4 soft convex and a belt for patient.   WOC nursing team will plan to see patient again on THURS for ostomy teaching with patient and NPwT dressing change.  Patient is visually impaired but reports she is mostly independent at home.   [name] [facility], CNS, [employer] [phone]

## 2023-11-05 NOTE — Plan of Care (Signed)
Problem: Coping: Goal: Ability to adjust to condition or change in health will improve Outcome: Progressing   Problem: Tissue Perfusion: Goal: Adequacy of tissue perfusion will improve Outcome: Progressing

## 2023-11-05 NOTE — Progress Notes (Signed)
PROGRESS NOTE        PATIENT DETAILS Name: [name] Age: 68 y.o. Sex: female Date of Birth: 09-06-1956 Admit Date: 10/27/2023 Admitting Physician Dolly Rias, MD WUJ:[id number], Devonne Doughty, NP  Brief Summary: Patient is a 68 y.o.  female with history of CKD 5, A-fib on Eliquis, CAD-who presented with abdominal pain-found to have acute diverticulitis with contained perforation involving the descending colon.  Hospital course complicated by worsening AKI-thought to have progressed to ESRD-requiring initiation of HD.  See below for further details.  Significant events: 2/2>> admit to [facility] 2/6>> CT abdomen worsening diverticulitis with perforation 2/7>> transitioned to full comfort measures-as with worsening AKI-however multiple family members arrived-comfort care revoked-now full code-full scope of treatment including HD/laparotomy with colostomy.  General surgery/nephrology reconsulted.  IR placed [facility] and patient underwent first hemodialysis.  2/8>> remains n.p.o-colectomy/colostomy later today.  Significant studies: 2/2>> CT abdomen/pelvis: Acute diverticulitis-descending colon with contained perforation 2/6>> CT abdomen/pelvis: Worsening diverticulitis of descending colon with perforation and increasing failure-suspected oral contrast extravasation left abdomen.  Significant microbiology data: 2/2>> COVID/influenza/RSV PCR: Negative  Procedures: 2/8>> by Dr. Freida Busman underwent exploratory laparotomy with Hartman's resection for perforated diverticulitis with diffuse peritonitis, colostomy in place.  Consults: Surgery Renal Palliative IR  Subjective: Patient in bed, appears comfortable, denies any headache, no fever, no chest pain or pressure, no shortness of breath , no abdominal pain. No focal weakness.  Objective: Vitals: Blood pressure (!) 154/56, pulse 85, temperature 98 F (36.7 C), temperature source Oral, resp. rate 15, height 5' 7.01" (1.702  m), weight (S) 96.8 kg, SpO2 97%.   Exam:  Awake Alert, No new F.N deficits, NG-tube in place to intermittent suction, dark bilious secretion, colostomy bag in place, right IJ HD catheter, Foley catheter  [place].AT,[name], [name],   Symmetrical Chest wall movement, Good air movement bilaterally, CTAB RRR,No Gallops, Rubs or new Murmurs,  +ve B.Sounds, Abd Soft, No tenderness,   No Cyanosis, Clubbing or edema    Assessment/Plan:  Acute diverticulitis with contained perforation of the descending colon Initially was managed conservatively, subsequently 2/8>> by Dr. Freida Busman general surgery she underwent exploratory laparotomy with Hartman's resection for perforated diverticulitis with diffuse peritonitis, colostomy in place, continue Zosyn, continue NG tube, defer further management of this issue to General surgery Case discussed with general surgery team on 11/05/2023, monitor closely on present treatment, if leukocytosis continues to get worse would likely reimage with CT in the next day or 2, monitor WBC count and H&H.   AKI on CKD 5 with progression to ESRD AKI likely hemodynamically mediated-has now likely progressed to ESRD.  HD initiated via right IJ temporary catheter.  CAD Currently no anginal symptoms Suspect not on antiplatelets as on anticoagulation with Eliquis Continue beta-blocker On Repatha injections as an outpatient.  PAF Sinus rhythm Continue Coreg Resume IV heparin when okay with surgery-Eliquis obviously on hold.  Likely resume on 11/06/2023 if CBC remains stable.  Anemia of acute illness  Brought on by acute illness, underlying renal insufficiency now developing to ESRD, surgical blood loss, gradually trending down, 2 units of packed RBC on 11/04/2023, on IV PPI. CBC looks stable on 11/05/2023 will continue to monitor closely.  HTN BP stable-continue Coreg/amlodipine.  DM-2 (A1c 6.6 on 2/3) CBGs relatively stable on SSI-follow/optimize as diet get started  back on.  Recent Labs    11/04/23  1817 11/04/23 2124 11/05/23 0811  GLUCAP 160* 165* 216*    Palliative care/goals of care Initially DNR-and wanted to do comfort care, now is full code wants to undergo aggressive treatment including surgery and dialysis.  Full code now.  Class 1 Obesity: Estimated body mass index is 33.42 kg/m as calculated from the following:   Height as of this encounter: 5' 7.01" (1.702 m).   Weight as of this encounter: 96.8 kg.    Code status:   Code Status: Full Code   DVT Prophylaxis:  IV Heparin  Family CommunicationHadley Pen- [phone] updated 2/7  Disposition Plan: Status is: Inpatient Remains inpatient appropriate because: Severity of illness   Planned Discharge Destination:Residential hospice   Diet: Diet Order             Diet NPO time specified Except for: Sips with Meds  Diet effective now                   MEDICATIONS: Scheduled Meds:  sodium chloride   Intravenous Once   sodium chloride   Intravenous Once   Chlorhexidine Gluconate Cloth  6 each Topical Q0600   insulin aspart  0-6 Units Subcutaneous TID WC   lidocaine-EPINEPHrine (PF)  10 mL Infiltration Once   mouth rinse  15 mL Mouth Rinse 4 times per day   pantoprazole (PROTONIX) IV  40 mg Intravenous Q12H   sodium chloride flush  10-40 mL Intracatheter Q12H   Continuous Infusions:  iron sucrose Stopped (11/04/23 1245)   piperacillin-tazobactam (ZOSYN)  IV 2.25 g (11/05/23 0840)   PRN Meds:.acetaminophen **OR** acetaminophen, albuterol, hydrALAZINE, HYDROmorphone (DILAUDID) injection, menthol-cetylpyridinium, ondansetron (ZOFRAN) IV, mouth rinse   I have personally reviewed following labs and imaging studies  LABORATORY DATA:   Data Review:    Recent Labs  Lab 11/01/23 0532 11/02/23 0133 11/03/23 0310 11/04/23 0209 11/05/23 0247  WBC 12.0* 16.0* 14.2* 19.0* 24.7*  HGB 9.0* 8.5* 7.8* 6.9* 9.1*  HCT 27.6* 26.5* 23.5* 20.7* 27.4*  PLT 384 373 313  317 316  MCV 79.8* 80.1 78.6* 77.8* 79.9*  MCH 26.0 25.7* 26.1 25.9* 26.5  MCHC 32.6 32.1 33.2 33.3 33.2  RDW 14.7 14.7 14.7 14.6 15.4  LYMPHSABS  --   --  0.7 0.9 1.1  MONOABS  --   --  1.6* 1.9* 2.1*  EOSABS  --   --  0.3 0.1 0.1  BASOSABS  --   --  0.1 0.1 0.1    Recent Labs  Lab 10/30/23 0501 10/31/23 0503 11/01/23 0532 11/02/23 0133 11/03/23 0310 11/04/23 0209 11/04/23 1455 11/05/23 0247  NA 135 134* 129* 132* 133*  --  136 135  K 4.4 4.1 4.6 4.2 4.0  --  3.9 3.9  CL 102 102 97* 97* 95*  --  96* 94*  CO2 18* 18* 14* 17* 19*  --  20* 21*  ANIONGAP 15 14 18* 18* 19*  --  22* 20*  GLUCOSE 209* 152* 236* 212* 193*  --  224* 194*  BUN 101* 100* 106* 94* 65*  --  81* 47*  CREATININE 9.26* 9.58* 10.48* 9.20* 7.34*  --  9.18* 6.10*  AST  --   --   --  8*  --   --   --   --   ALT  --   --   --  9  --   --   --   --   ALKPHOS  --   --   --  29*  --   --   --   --   BILITOT  --   --   --  1.2  --   --   --   --   ALBUMIN 2.6* 2.4* 2.2* 2.0* 2.1*  --  1.8*  --   CRP  --   --   --   --  22.7* 24.8*  --  23.5*  PROCALCITON  --   --   --   --  41.53 42.39  --  30.19  TSH  --   --   --   --  1.046  --   --   --   BNP  --   --   --   --  78.5 74.4  --  179.0*  MG  --   --   --   --  2.1 2.3  --  2.1  PHOS 6.8* 6.1* 7.6*  --  6.5*  --  7.0*  --   CALCIUM 7.9* 7.6* 7.6* 7.9* 7.5*  --  7.4* 7.7*      Recent Labs  Lab 11/01/23 0532 11/02/23 0133 11/03/23 0310 11/04/23 0209 11/04/23 1455 11/05/23 0247  CRP  --   --  22.7* 24.8*  --  23.5*  PROCALCITON  --   --  41.53 42.39  --  30.19  TSH  --   --  1.046  --   --   --   BNP  --   --  78.5 74.4  --  179.0*  MG  --   --  2.1 2.3  --  2.1  CALCIUM 7.6* 7.9* 7.5*  --  7.4* 7.7*    --------------------------------------------------------------------------------------------------------------- Lab Results  Component Value Date   CHOL 134 12/21/2019   HDL 49 12/21/2019   LDLCALC 69 12/21/2019   TRIG 82 12/21/2019   CHOLHDL  2.7 12/21/2019    Lab Results  Component Value Date   HGBA1C 6.6 (H) 10/28/2023   Recent Labs    11/03/23 0310  TSH 1.046   No results for input(s): "VITAMINB12", "FOLATE", "FERRITIN", "TIBC", "IRON", "RETICCTPCT" in the last 72 hours. ------------------------------------------------------------------------------------------------------------------ Cardiac Enzymes No results for input(s): "CKMB", "TROPONINI", "MYOGLOBIN" in the last 168 hours.  Invalid input(s): "CK"  Micro Results Recent Results (from the past 240 hours)  Resp panel by RT-PCR (RSV, Flu A&B, Covid) Anterior Nasal Swab     Status: None   Collection Time: 10/27/23  7:18 PM   Specimen: Anterior Nasal Swab  Result Value Ref Range Status   SARS Coronavirus 2 by RT PCR NEGATIVE NEGATIVE Final   Influenza A by PCR NEGATIVE NEGATIVE Final   Influenza B by PCR NEGATIVE NEGATIVE Final    Comment: (NOTE) The Xpert Xpress SARS-CoV-2/FLU/RSV plus assay is intended as an aid in the diagnosis of influenza from Nasopharyngeal swab specimens and should not be used as a sole basis for treatment. Nasal washings and aspirates are unacceptable for Xpert Xpress SARS-CoV-2/FLU/RSV testing.  Fact Sheet for Patients: BloggerCourse.com  Fact Sheet for Healthcare Providers: SeriousBroker.it  This test is not yet approved or cleared by the [place] FDA and has been authorized for detection and/or diagnosis of SARS-CoV-2 by FDA under an Emergency Use Authorization (EUA). This EUA will remain in effect (meaning this test can be used) for the duration of the COVID-19 declaration under Section 564(b)(1) of the Act, 21 U.S.C. section 360bbb-3(b)(1), unless the authorization is terminated or revoked.     Resp Syncytial Virus by  PCR NEGATIVE NEGATIVE Final    Comment: (NOTE) Fact Sheet for Patients: BloggerCourse.com  Fact Sheet for Healthcare  Providers: SeriousBroker.it  This test is not yet approved or cleared by the [place] FDA and has been authorized for detection and/or diagnosis of SARS-CoV-2 by FDA under an Emergency Use Authorization (EUA). This EUA will remain in effect (meaning this test can be used) for the duration of the COVID-19 declaration under Section 564(b)(1) of the Act, 21 U.S.C. section 360bbb-3(b)(1), unless the authorization is terminated or revoked.  Performed at [facility], [address], [place], [place] [address]     Radiology Reports DG Chest [place] 1 View Result Date: 11/03/2023 CLINICAL DATA:  Shortness of breath EXAM: PORTABLE CHEST - 1 VIEW COMPARISON:  11/11/2018 FINDINGS: Unchanged mild cardiomegaly and pulmonary vascular congestion. Interval worsening of left basilar opacity likely due to atelectasis. Lungs otherwise clear. Examination is limited due to expiratory phase of imaging. Interval placement of temporary right IJ hemodialysis catheter which terminates in the region of the right atrium. Nasogastric tube terminates in the left upper quadrant. IMPRESSION: Interval worsening of left basilar opacity which is likely due to atelectasis. Electronically Signed   By: Acquanetta Belling M.D.   On: 11/03/2023 07:22   IR Fluoro Guide CV Line Right Result Date: 11/01/2023 INDICATION: ESRD requiring HD. EXAM: NON-TUNNELED CENTRAL VENOUS HEMODIALYSIS CATHETER PLACEMENT WITH ULTRASOUND AND FLUOROSCOPIC GUIDANCE COMPARISON:  CT AP 10/31/2023. MEDICATIONS: Local anesthetic was administered. FLUOROSCOPY TIME:  Fluoroscopic dose; 35 mGy COMPLICATIONS: None immediate. PROCEDURE: Informed written consent was obtained from the patient and/or patient's representative after a discussion of the risks, benefits, and alternatives to treatment. Questions regarding the procedure were encouraged and answered. The RIGHT neck and chest were prepped with chlorhexidine in a sterile fashion, and a  sterile drape was applied covering the operative field. Maximum barrier sterile technique with sterile gowns and gloves were used for the procedure. A timeout was performed prior to the initiation of the procedure. After the overlying soft tissues were anesthetized, a small venotomy incision was created and a micropuncture kit was utilized to access the external jugular vein. Real-time ultrasound guidance was utilized for vascular access including the acquisition of a permanent ultrasound image documenting patency of the accessed vessel. The microwire was utilized to measure appropriate catheter length. A stiff glidewire was advanced to the level of the IVC. Under fluoroscopic guidance, the venotomy was serially dilated, ultimately allowing placement of a 20 cm temporary Trialysis catheter with tip ultimately terminating within the superior aspect of the right atrium. Final catheter positioning was confirmed and documented with a spot radiographic image. The catheter aspirates and flushes normally. The catheter was flushed with appropriate volume heparin dwells. The catheter exit site was secured with a 2-0 Ethilon retention suture. A dressing was placed. The patient tolerated the procedure well without immediate post procedural complication. IMPRESSION: Successful placement of a RIGHT EXTERNAL jugular approach 20 cm non-tunneled dialysis catheter The tip of the catheter is positioned within the proximal RIGHT atrium. The catheter is ready for immediate use. PLAN: This catheter may be converted to a tunneled dialysis catheter at a later date as indicated. Roanna Banning, MD [facility] Radiology Electronically Signed   By: Roanna Banning M.D.   On: 11/01/2023 18:15   IR US Guide Vasc Access Right Result Date: 11/01/2023 INDICATION: ESRD requiring HD. EXAM: NON-TUNNELED CENTRAL VENOUS HEMODIALYSIS CATHETER PLACEMENT WITH ULTRASOUND AND FLUOROSCOPIC GUIDANCE COMPARISON:   CT AP 10/31/2023.  MEDICATIONS: Local anesthetic was administered. FLUOROSCOPY TIME:  Fluoroscopic dose; 35 mGy COMPLICATIONS: None immediate. PROCEDURE: Informed written consent was obtained from the patient and/or patient's representative after a discussion of the risks, benefits, and alternatives to treatment. Questions regarding the procedure were encouraged and answered. The RIGHT neck and chest were prepped with chlorhexidine in a sterile fashion, and a sterile drape was applied covering the operative field. Maximum barrier sterile technique with sterile gowns and gloves were used for the procedure. A timeout was performed prior to the initiation of the procedure. After the overlying soft tissues were anesthetized, a small venotomy incision was created and a micropuncture kit was utilized to access the external jugular vein. Real-time ultrasound guidance was utilized for vascular access including the acquisition of a permanent ultrasound image documenting patency of the accessed vessel. The microwire was utilized to measure appropriate catheter length. A stiff glidewire was advanced to the level of the IVC. Under fluoroscopic guidance, the venotomy was serially dilated, ultimately allowing placement of a 20 cm temporary Trialysis catheter with tip ultimately terminating within the superior aspect of the right atrium. Final catheter positioning was confirmed and documented with a spot radiographic image. The catheter aspirates and flushes normally. The catheter was flushed with appropriate volume heparin dwells. The catheter exit site was secured with a 2-0 Ethilon retention suture. A dressing was placed. The patient tolerated the procedure well without immediate post procedural complication. IMPRESSION: Successful placement of a RIGHT EXTERNAL jugular approach 20 cm non-tunneled dialysis catheter The tip of the catheter is positioned within the proximal RIGHT atrium. The catheter is ready for immediate use.  PLAN: This catheter may be converted to a tunneled dialysis catheter at a later date as indicated. Roanna Banning, MD [facility] Rad[facility] Electronically Signed   By: Roanna Banning M.D.   On: 11/01/2023 18:15   CT ABDOMEN PELVIS WO CONTRAST Result Date: 10/31/2023 CLINICAL DATA:  Diverticulitis, complication suspected. EXAM: CT ABDOMEN AND PELVIS WITHOUT CONTRAST TECHNIQUE: Multidetector CT imaging of the abdomen and pelvis was performed following the standard protocol without IV contrast. RADIATION DOSE REDUCTION: This exam was performed according to the departmental dose-optimization program which includes automated exposure control, adjustment of the mA and/or kV according to patient size and/or use of iterative reconstruction technique. COMPARISON:  10/27/2023. FINDINGS: Lower chest: Heart is enlarged and multi-vessel coronary artery calcifications are noted. Consolidation and patchy airspace disease is present at the lung bases bilaterally. Hepatobiliary: No focal liver abnormality is seen. Stones are present within the gallbladder. No biliary ductal dilatation. Pancreas: Unremarkable. No pancreatic ductal dilatation or surrounding inflammatory changes. Spleen: Normal in size without focal abnormality. Adrenals/Urinary Tract: The adrenal glands are within normal limits. Calcifications are present in the upper pole of the left kidney. There is a cyst in the lower pole of the right kidney. No hydronephrosis bilaterally. The bladder is unremarkable. Stomach/Bowel: The stomach is within normal limits. No bowel obstruction or pneumatosis is seen. Right hemicolectomy changes are noted. There is diffuse colonic wall thickening. Scattered diverticula are present along the colon. A few small foci of free air are noted in the mesentery in the mid left abdomen, slightly increased from the previous exam. There is suggestion of contrast extravasation in the region. There is  fine no definite abscess is seen. Vascular/Lymphatic: Aortic atherosclerosis. No enlarged abdominal or pelvic lymph nodes. Reproductive: No acute abnormality. Other: Mild ascites is noted in all 4 quadrants. A fat containing umbilical hernia is noted. There  is mild anasarca. Musculoskeletal: The bony structures are stable. IMPRESSION: 1. Worsening diverticulitis of the descending colon with perforation and increasing free air and suspected oral contrast extravasation in the mid left abdomen. No definite abscess is seen. Surgical consultation is recommended. 2. Mild ascites. 3. Diffuse colonic wall thickening which may be due to associated inflammatory changes. 4. Patchy airspace disease at the lung bases with bilateral lower lobe consolidation. 5. Cholelithiasis. 6. Aortic atherosclerosis. Critical Value/emergent results were called by telephone at the time of interpretation on 10/31/2023 at 11:59 pm to provider Dr. Antionette Char, who verbally acknowledged these results. Electronically Signed   By: Thornell Sartorius M.D.   On: 10/31/2023 23:59   CT ABDOMEN PELVIS WO CONTRAST Result Date: 10/27/2023 CLINICAL DATA:  Acute nonlocalized abdominal pain. Chills and fever. Vomiting and diarrhea. EXAM: CT ABDOMEN AND PELVIS WITHOUT CONTRAST TECHNIQUE: Multidetector CT imaging of the abdomen and pelvis was performed following the standard protocol without IV contrast. RADIATION DOSE REDUCTION: This exam was performed according to the departmental dose-optimization program which includes automated exposure control, adjustment of the mA and/or kV according to patient size and/or use of iterative reconstruction technique. COMPARISON:  CT abdomen and pelvis 04/19/2021 FINDINGS: Lower chest: Bibasilar atelectasis.  No acute abnormality. Hepatobiliary: Cholelithiasis without evidence of acute cholecystitis. No biliary dilation. Unremarkable noncontrast appearance of the liver. Pancreas: Unremarkable. Spleen: Unremarkable. Adrenals/Urinary  Tract: Normal adrenal glands. No urinary calculi or hydronephrosis. Unremarkable bladder. Stomach/Bowel: Stomach is within normal limits. No bowel obstruction. Colonic diverticulosis. Wall thickening, inflammatory stranding, and free fluid about the descending colon. Loosely organized fluid and gas about the inflamed descending colon (circa series 3/image [id number]) compatible with contained perforation. The adjacent jejunum is inflamed. Postoperative change about the colon with ileocolonic anastomosis in the right upper quadrant. Vascular/Lymphatic: Aortic atherosclerosis. No enlarged abdominal or pelvic lymph nodes. Reproductive: No acute abnormality. Other: Fat containing periumbilical hernia. Musculoskeletal: No acute fracture. IMPRESSION: 1. Acute diverticulitis of the descending colon with contained perforation. 2. Inflamed jejunum adjacent to the perforated diverticulitis. 3.  Aortic Atherosclerosis (ICD10-I70.0). Critical Value/emergent results were called by telephone at the time of interpretation on 10/27/2023 at 9:22 pm to provider [facility] , who verbally acknowledged these results. Electronically Signed   By: Minerva Fester M.D.   On: 10/27/2023 21:24    Signature  -   Susa Raring M.D on 11/05/2023 at 9:43 AM   -  To page go to [email or web address]

## 2023-11-05 NOTE — Progress Notes (Addendum)
Inpatient Rehab Coordinator Note:  I met with pt at bedside to discuss CIR recommendations and goals/expectations of CIR stay.  We reviewed 3 hrs/day of therapy, physician follow up, and average length of stay 2 weeks (dependent upon progress) with goals of supervision.  We discussed current medical barriers to d/c, including NG tube and WBC climbing.  She is agreeable to CIR prior to d/c home if still needed when stable and reports she has 24/7 support from her 3 adult sons.  I will follow for improvement and ask for PMR MD consult when closer to being ready.   1125: Spoke to pt's son, Asher Muir, over the phone.  He confirms pt lives with her 3 adult sons, and 1 is there 24/7 and can provide supervision.  Pt uses [employer] for transportation due to her baseline visual deficits.  I will continue to follow.   Estill Dooms, PT, DPT Admissions Coordinator [phone] 11/05/23  11:15 AM

## 2023-11-05 NOTE — Progress Notes (Signed)
Physical Therapy Treatment Patient Details Name: [name] MRN: [id number] DOB: July 10, 1956 Today's Date: 11/05/2023   History of Present Illness Patient is a 68 y.o.  female - who presented with abdominal pain-found to have acute diverticulitis with contained perforation involving the descending colon. 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  Past medical history of CKD 5, A-fib on Eliquis, CAD    PT Comments  Pt tolerated treatment well today. Pt today was able to transfer to chair with CGA RW +2 assist for lines and leads. No change in DC/DME recs at this time. PT will continue to follow.     If plan is discharge home, recommend the following: A little help with walking and/or transfers;A little help with bathing/dressing/bathroom;Assistance with cooking/housework;Assist for transportation;Help with stairs or ramp for entrance   Can travel by private vehicle        Equipment Recommendations  None recommended by PT    Recommendations for Other Services       Precautions / Restrictions Precautions Precautions: Fall Precaution/Restrictions Comments: NGT Restrictions Weight Bearing Restrictions Per Provider Order: No     Mobility  Bed Mobility Overal bed mobility: Needs Assistance Bed Mobility: Supine to Sit     Supine to sit: Min assist     General bed mobility comments: assist for trunk during log roll and to assist feet back into bed    Transfers Overall transfer level: Needs assistance Equipment used: Rolling walker (2 wheels) Transfers: Sit to/from Stand, Bed to chair/wheelchair/BSC Sit to Stand: Min assist   Step pivot transfers: Contact guard assist, +2 safety/equipment       General transfer comment: Min A to stand. CGA to chair. +2 for lines and leads.    Ambulation/Gait                   Stairs             Wheelchair Mobility     Tilt Bed    Modified Rankin (Stroke Patients Only)       Balance Overall  balance assessment: Needs assistance Sitting-balance support: Feet supported, No upper extremity supported Sitting balance-Leahy Scale: Good     Standing balance support: During functional activity, Bilateral upper extremity supported Standing balance-Leahy Scale: Poor Standing balance comment: with RW support                            Communication Communication Communication: No apparent difficulties  Cognition Arousal: Lethargic Behavior During Therapy: WFL for tasks assessed/performed   PT - Cognitive impairments: No apparent impairments                         Following commands: Intact      Cueing Cueing Techniques: Verbal cues, Tactile cues  Exercises      General Comments General comments (skin integrity, edema, etc.): VSS      Pertinent Vitals/Pain Pain Assessment Pain Assessment: Faces Faces Pain Scale: Hurts little more Pain Location: abdomen Pain Descriptors / Indicators: Sore, Discomfort Pain Intervention(s): Monitored during session    Home Living                          Prior Function            PT Goals (current goals can now be found in the care plan section) Progress towards PT goals: Progressing toward goals  Frequency    Min 1X/week      PT Plan      Co-evaluation              AM-PAC PT "6 Clicks" Mobility   Outcome Measure  Help needed turning from your back to your side while in a flat bed without using bedrails?: None Help needed moving from lying on your back to sitting on the side of a flat bed without using bedrails?: A Little Help needed moving to and from a bed to a chair (including a wheelchair)?: A Little Help needed standing up from a chair using your arms (e.g., wheelchair or bedside chair)?: A Little Help needed to walk in hospital room?: A Little Help needed climbing 3-5 steps with a railing? : Total 6 Click Score: 17    End of Session Equipment Utilized During Treatment:  Gait belt;Oxygen Activity Tolerance: Patient tolerated treatment well Patient left: with nursing/sitter in room;in chair;with chair alarm set;with call bell/phone within reach Nurse Communication: Mobility status PT Visit Diagnosis: Other abnormalities of gait and mobility (R26.89)     Time: 1610-9604 PT Time Calculation (min) (ACUTE ONLY): 15 min  Charges:    $Therapeutic Activity: 8-22 mins PT General Charges $$ ACUTE PT VISIT: 1 Visit                     [name], PT, DPT Acute Rehab Services [phone]    [name] 11/05/2023, 3:58 PM

## 2023-11-05 NOTE — Progress Notes (Signed)
Patient ID: [name], female   DOB: 30-Jan-1956, 68 y.o.   MRN: [id number] [facility] Progress Note   Assessment/ Plan:   1.  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  After initial refusal of wanting to pursue renal replacement therapy, she changed her mind and asked to start dialysis with the intention of undergoing surgery for contained perforation of acute diverticulitis.   - HD tomorrow - need to convert to tunneled at some point - CTM 2.  Acute diverticulitis with contained perforation of descending colon: On Zosyn and now status post exploratory laparotomy with Hartman's resection for perforated diverticulitis/peritonitis yesterday (2/8).  Surgical note reviewed from today along with wound assessment. 3.  Anion gap metabolic acidosis: Likely secondary to combination of acute kidney injury and ongoing infection, monitor with dialysis. 4.  Anemia: Likely secondary to chronic illness, iron stores significantly low and yesterday started on intravenous iron for loading prior to starting ESA.  She is now status post PRBC transfusion  Subjective:    Tolerated HD yesterday.  Resting in bed today.  No issues.    Objective:   BP (!) 154/56   Pulse 85   Temp 98 F (36.7 C) (Oral)   Resp 15   Ht 5' 7.01" (1.702 m)   Wt (S) 96.8 kg Comment: Bed Scale  SpO2 97%   BMI 33.42 kg/m   Intake/Output Summary (Last 24 hours) at 11/05/2023 1240 Last data filed at 11/05/2023 0600 Gross per 24 hour  Intake 110 ml  Output 1850 ml  Net -1740 ml   Weight change: -1.7 kg  Physical Exam: Gen: Uncomfortably resting in bed, NGT in place with LIS and bilious drainage CVS: Pulse regular rhythm, normal rate, S1 and S2 normal Resp: Clear to auscultation bilaterally with diminished breath sounds over bases, no rales Abd: Protuberant with colostomy  bag in situ.  (Wound assessment noted on surgical note) Ext: Trace ankle edema.  No asterixis/myoclonic jerks noted  Imaging: No results found.   Labs: BMET Recent Labs  Lab 10/30/23 0501 10/31/23 0503 11/01/23 0532 11/02/23 0133 11/03/23 0310 11/04/23 1455 11/05/23 0247  NA 135 134* 129* 132* 133* 136 135  K 4.4 4.1 4.6 4.2 4.0 3.9 3.9  CL 102 102 97* 97* 95* 96* 94*  CO2 18* 18* 14* 17* 19* 20* 21*  GLUCOSE 209* 152* 236* 212* 193* 224* 194*  BUN 101* 100* 106* 94* 65* 81* 47*  CREATININE 9.26* 9.58* 10.48* 9.20* 7.34* 9.18* 6.10*  CALCIUM 7.9* 7.6* 7.6* 7.9* 7.5* 7.4* 7.7*  PHOS 6.8* 6.1* 7.6*  --  6.5* 7.0*  --    CBC Recent Labs  Lab 11/02/23 0133 11/03/23 0310 11/04/23 0209 11/05/23 0247  WBC 16.0* 14.2* 19.0* 24.7*  NEUTROABS  --  10.4* 14.4* 19.5*  HGB 8.5* 7.8* 6.9* 9.1*  HCT 26.5* 23.5* 20.7* 27.4*  MCV 80.1 78.6* 77.8* 79.9*  PLT 373 313 317 316    Medications:     sodium chloride   Intravenous Once   sodium chloride   Intravenous Once   Chlorhexidine Gluconate Cloth  6 each Topical Q0600   insulin aspart  0-6 Units Subcutaneous TID WC   lidocaine-EPINEPHrine (PF)  10 mL Infiltration Once   mouth rinse  15 mL Mouth Rinse 4 times per day   pantoprazole (PROTONIX) IV  40 mg Intravenous Q12H   sodium chloride flush  10-40  mL Intracatheter Q12H    Bufford Buttner, MD 11/05/2023, 12:40 PM

## 2023-11-05 NOTE — Progress Notes (Signed)
3 Days Post-Op  Subjective: Resting.  1300cc from NGT yesterday.  Colostomy is starting to work today some.   Objective: Vital signs in last 24 hours: Temp:  [97.8 F (36.6 C)-98.9 F (37.2 C)] 98 F (36.7 C) (02/11 0810) Pulse Rate:  [71-85] 85 (02/11 0810) Resp:  [14-19] 15 (02/11 0810) BP: (136-163)/(56-77) 154/56 (02/11 0810) SpO2:  [97 %-100 %] 97 % (02/11 0810) Weight:  [96.8 kg-97.6 kg] 96.8 kg (02/10 1732) Last BM Date : 10/31/23  Intake/Output from previous day: 02/10 0701 - 02/11 0700 In: 110 [Blood:110] Out: 1850 [Urine:50; Emesis/NG output:1300] Intake/Output this shift: No intake/output data recorded.  PE: Gen:  NAD Abd: soft, appropriately tender, midline wound overall more clean than yesterday, some necrotic adipose at base of inferior aspect, but fascia intact.  Colostomy with dark liquid brown stool present. Stoma covered in stool, but viable from picture yesterday  Lab Results:  Recent Labs    11/04/23 0209 11/05/23 0247  WBC 19.0* 24.7*  HGB 6.9* 9.1*  HCT 20.7* 27.4*  PLT 317 316   BMET Recent Labs    11/04/23 1455 11/05/23 0247  NA 136 135  K 3.9 3.9  CL 96* 94*  CO2 20* 21*  GLUCOSE 224* 194*  BUN 81* 47*  CREATININE 9.18* 6.10*  CALCIUM 7.4* 7.7*   PT/INR No results for input(s): "LABPROT", "INR" in the last 72 hours. CMP     Component Value Date/Time   NA 135 11/05/2023 0247   NA 137 02/28/2021 1548   K 3.9 11/05/2023 0247   CL 94 (L) 11/05/2023 0247   CO2 21 (L) 11/05/2023 0247   GLUCOSE 194 (H) 11/05/2023 0247   BUN 47 (H) 11/05/2023 0247   BUN 33 (H) 02/28/2021 1548   CREATININE 6.10 (H) 11/05/2023 0247   CREATININE 3.38 (H) 12/26/2015 1347   CALCIUM 7.7 (L) 11/05/2023 0247   PROT 6.0 (L) 11/02/2023 0133   PROT 7.0 11/07/2017 1414   ALBUMIN 1.8 (L) 11/04/2023 1455   ALBUMIN 4.0 11/07/2017 1414   AST 8 (L) 11/02/2023 0133   ALT 9 11/02/2023 0133   ALKPHOS 29 (L) 11/02/2023 0133   BILITOT 1.2 11/02/2023 0133    BILITOT 0.2 11/07/2017 1414   GFRNONAA 7 (L) 11/05/2023 0247   GFRNONAA 13 (L) 01/10/2015 1428   GFRAA 21 (L) 12/21/2019 1802   GFRAA 16 (L) 01/10/2015 1428   Lipase     Component Value Date/Time   LIPASE 26 10/27/2023 1936    Studies/Results: No results found.   Anti-infectives: Anti-infectives (From admission, onward)    Start     Dose/Rate Route Frequency Ordered Stop   11/01/23 1415  piperacillin-tazobactam (ZOSYN) IVPB 2.25 g        2.25 g 100 mL/hr over 30 Minutes Intravenous Every 8 hours 11/01/23 1316 11/06/23 1359   10/29/23 1100  piperacillin-tazobactam (ZOSYN) IVPB 2.25 g  Status:  Discontinued        2.25 g 100 mL/hr over 30 Minutes Intravenous Every 8 hours 10/29/23 1006 11/01/23 1016   10/28/23 0600  piperacillin-tazobactam (ZOSYN) IVPB 2.25 g  Status:  Discontinued        2.25 g 100 mL/hr over 30 Minutes Intravenous Every 8 hours 10/27/23 2224 10/27/23 2235   10/28/23 0600  ceFEPIme (MAXIPIME) 1 g in sodium chloride 0.9 % 100 mL IVPB  Status:  Discontinued        1 g 200 mL/hr over 30 Minutes Intravenous Every 24 hours 10/27/23 2235 10/29/23  1610   10/28/23 0600  metroNIDAZOLE (FLAGYL) IVPB 500 mg  Status:  Discontinued        500 mg 100 mL/hr over 60 Minutes Intravenous Every 12 hours 10/27/23 2235 10/29/23 0954   10/27/23 2130  piperacillin-tazobactam (ZOSYN) IVPB 3.375 g        3.375 g 100 mL/hr over 30 Minutes Intravenous  Once 10/27/23 2128 10/27/23 2320        Assessment/Plan  POD 3, s/p Hartmann's by Dr. Freida Busman 11/02/23 forSigmoid diverticulitis with perforation  -cont abx therapy for 4 days post op, but may need to extend as WBC rising, but remains AF -cont NGT given high output and just beginning to have colostomy function -hgb 9.1 -therapies for mobilization. -VAC to midline wound today, will change M/Th -WOC for colostomy care  FEN: NPO/NGT/IVFs VTE: heparin gtt - hasn't been started post op, cont to hold due to anemia, but can be restarted  tomorrow if hgb stable ID: cefepime/flagyl 2/3>2/4; Zosyn 2/4>>   - per TRH -  PAF  HTN HLD AKI on CKD stage V - on HD CAD T2DM    LOS: 9 days    Letha Cape , [facility] Surgery 11/05/2023, 8:51 AM Please see Amion for pager number during day hours 7:00am-4:30pm

## 2023-11-05 NOTE — Inpatient Diabetes Management (Signed)
Inpatient Diabetes Program Recommendations  AACE/ADA: New Consensus Statement on Inpatient Glycemic Control (2015)  Target Ranges:  Prepandial:   less than 140 mg/dL      Peak postprandial:   less than 180 mg/dL (1-2 hours)      Critically ill patients:  140 - 180 mg/dL   Lab Results  Component Value Date   GLUCAP 203 (H) 11/05/2023   HGBA1C 6.6 (H) 10/28/2023    Review of Glycemic Control  Latest Reference Range & Units 11/04/23 11:17 11/04/23 18:17 11/04/23 21:24 11/05/23 08:11 11/05/23 12:20  Glucose-Capillary 70 - 99 mg/dL 098 (H) 119 (H) 147 (H) 216 (H) 203 (H)   Diabetes history: DM  Outpatient Diabetes medications:  Lantus 10 -15 units daily, Novolin R 5-10 units tid with meals  Current orders for Inpatient glycemic control:  Novolog 0-6 units tid with meals   Inpatient Diabetes Program Recommendations:    May consider adding Semglee 6 units daily for basal insulin coverage.   Thanks  Lorenza Cambridge, RN, BC-ADM Inpatient Diabetes Coordinator Pager [phone]  (8a-5p)

## 2023-11-06 ENCOUNTER — Inpatient Hospital Stay (HOSPITAL_COMMUNITY): Payer: Medicare PPO

## 2023-11-06 DIAGNOSIS — K5732 Diverticulitis of large intestine without perforation or abscess without bleeding: Secondary | ICD-10-CM | POA: Diagnosis not present

## 2023-11-06 DIAGNOSIS — Z515 Encounter for palliative care: Secondary | ICD-10-CM | POA: Diagnosis not present

## 2023-11-06 DIAGNOSIS — Z7189 Other specified counseling: Secondary | ICD-10-CM | POA: Diagnosis not present

## 2023-11-06 LAB — CBC WITH DIFFERENTIAL/PLATELET
Abs Immature Granulocytes: 0 10*3/uL (ref 0.00–0.07)
Basophils Absolute: 0 10*3/uL (ref 0.0–0.1)
Basophils Relative: 0 %
Eosinophils Absolute: 0.3 10*3/uL (ref 0.0–0.5)
Eosinophils Relative: 1 %
HCT: 27.9 % — ABNORMAL LOW (ref 36.0–46.0)
Hemoglobin: 9.2 g/dL — ABNORMAL LOW (ref 12.0–15.0)
Lymphocytes Relative: 2 %
Lymphs Abs: 0.5 10*3/uL — ABNORMAL LOW (ref 0.7–4.0)
MCH: 26.3 pg (ref 26.0–34.0)
MCHC: 33 g/dL (ref 30.0–36.0)
MCV: 79.7 fL — ABNORMAL LOW (ref 80.0–100.0)
Monocytes Absolute: 2.4 10*3/uL — ABNORMAL HIGH (ref 0.1–1.0)
Monocytes Relative: 9 %
Neutro Abs: 23.6 10*3/uL — ABNORMAL HIGH (ref 1.7–7.7)
Neutrophils Relative %: 88 %
Platelets: 309 10*3/uL (ref 150–400)
RBC: 3.5 MIL/uL — ABNORMAL LOW (ref 3.87–5.11)
RDW: 15.8 % — ABNORMAL HIGH (ref 11.5–15.5)
WBC: 26.8 10*3/uL — ABNORMAL HIGH (ref 4.0–10.5)
nRBC: 0 /100{WBCs}
nRBC: 0.1 % (ref 0.0–0.2)

## 2023-11-06 LAB — HEPARIN LEVEL (UNFRACTIONATED): Heparin Unfractionated: 0.29 [IU]/mL — ABNORMAL LOW (ref 0.30–0.70)

## 2023-11-06 LAB — BASIC METABOLIC PANEL
Anion gap: 20 — ABNORMAL HIGH (ref 5–15)
BUN: 62 mg/dL — ABNORMAL HIGH (ref 8–23)
CO2: 21 mmol/L — ABNORMAL LOW (ref 22–32)
Calcium: 7.5 mg/dL — ABNORMAL LOW (ref 8.9–10.3)
Chloride: 93 mmol/L — ABNORMAL LOW (ref 98–111)
Creatinine, Ser: 7.45 mg/dL — ABNORMAL HIGH (ref 0.44–1.00)
GFR, Estimated: 6 mL/min — ABNORMAL LOW (ref >60)
Glucose, Bld: 254 mg/dL — ABNORMAL HIGH (ref 70–99)
Potassium: 3.8 mmol/L (ref 3.5–5.1)
Sodium: 134 mmol/L — ABNORMAL LOW (ref 135–145)

## 2023-11-06 LAB — GLUCOSE, CAPILLARY
Glucose-Capillary: 250 mg/dL — ABNORMAL HIGH (ref 70–99)
Glucose-Capillary: 233 mg/dL — ABNORMAL HIGH (ref 70–99)
Glucose-Capillary: 157 mg/dL — ABNORMAL HIGH (ref 70–99)
Glucose-Capillary: 161 mg/dL — ABNORMAL HIGH (ref 70–99)

## 2023-11-06 LAB — PROCALCITONIN: Procalcitonin: 25.65 ng/mL

## 2023-11-06 LAB — BRAIN NATRIURETIC PEPTIDE: B Natriuretic Peptide: 244.4 pg/mL — ABNORMAL HIGH (ref 0.0–100.0)

## 2023-11-06 LAB — MAGNESIUM: Magnesium: 2.3 mg/dL (ref 1.7–2.4)

## 2023-11-06 LAB — C-REACTIVE PROTEIN: CRP: 19.3 mg/dL — ABNORMAL HIGH (ref <1.0)

## 2023-11-06 MED ORDER — PIPERACILLIN-TAZOBACTAM IN DEX 2-0.25 GM/50ML IV SOLN
2.2500 g | Freq: Three times a day (TID) | INTRAVENOUS | Status: DC
  Administered 2023-11-06 – 2023-11-14 (×23): 2.25 g via INTRAVENOUS
  Filled 2023-11-06 (×25): qty 50

## 2023-11-06 MED ORDER — HEPARIN (PORCINE) 25000 UT/250ML-% IV SOLN
1500.0000 [IU]/h | INTRAVENOUS | Status: AC
  Administered 2023-11-06: 1400 [IU]/h via INTRAVENOUS
  Administered 2023-11-07 – 2023-11-11 (×5): 1800 [IU]/h via INTRAVENOUS
  Administered 2023-11-12: 1500 [IU]/h via INTRAVENOUS
  Filled 2023-11-06 (×7): qty 250

## 2023-11-06 NOTE — Progress Notes (Signed)
   11/06/23 1715  Vitals  Pulse Rate 72  Resp 18  BP (!) 171/75  SpO2 100 %  Post Treatment  Dialyzer Clearance Lightly streaked  Hemodialysis Intake (mL) 0 mL  Fluid Removed (mL) 0 mL  Tolerated HD Treatment Yes   Received patient in bed to unit.  Alert and oriented.  Informed consent signed and in chart.   TX duration:3 HRS  Patient tolerated well.  Transported back to the room  Alert, without acute distress.  Hand-off given to patient's nurse.   Access used: [facility] Access issues: NONE  Total UF removed: 0 PER ORDERS Medication(s) given: NONE    [facility]

## 2023-11-06 NOTE — Progress Notes (Signed)
PROGRESS NOTE        PATIENT DETAILS Name: [name] Age: 68 y.o. Sex: female Date of Birth: 1956/03/12 Admit Date: 10/27/2023 Admitting Physician Dolly Rias, MD MVH:QIONG, Devonne Doughty, NP  Brief Summary: Patient is a 68 y.o.  female with history of CKD 5, A-fib on Eliquis, CAD-who presented with abdominal pain-found to have acute diverticulitis with contained perforation involving the descending colon.  Hospital course complicated by worsening AKI-thought to have progressed to ESRD-requiring initiation of HD.  See below for further details.  Significant events: 2/2>> admit to [facility] 2/6>> CT abdomen worsening diverticulitis with perforation 2/7>> transitioned to full comfort measures-as with worsening AKI-however multiple family members arrived-comfort care revoked-now full code-full scope of treatment including HD/laparotomy with colostomy.  General surgery/nephrology reconsulted.  IR placed [facility] and patient underwent first hemodialysis.  2/8>> remains n.p.o-colectomy/colostomy later today.  Significant studies: 2/2>> CT abdomen/pelvis: Acute diverticulitis-descending colon with contained perforation 2/6>> CT abdomen/pelvis: Worsening diverticulitis of descending colon with perforation and increasing failure-suspected oral contrast extravasation left abdomen.  Significant microbiology data: 2/2>> COVID/influenza/RSV PCR: Negative  Procedures: 2/8>> by Dr. Freida Busman underwent exploratory laparotomy with Hartman's resection for perforated diverticulitis with diffuse peritonitis, colostomy in place.  Consults: Surgery Renal Palliative IR  Subjective:  Patient in bed, appears comfortable, denies any headache, no fever, no chest pain or pressure, no shortness of breath , no abdominal pain. No focal weakness.  Objective: Vitals: Blood pressure (!) 148/58, pulse 76, temperature 97.8 F (36.6 C), temperature source Oral, resp. rate 15, height 5' 7.01" (1.702  m), weight (S) 96.8 kg, SpO2 94%.   Exam:  Awake Alert, No new F.N deficits, NG-tube in place to intermittent suction, dark bilious secretion, colostomy bag in place, right IJ HD catheter, Foley catheter  Weir.AT,[name], [name],   Symmetrical Chest wall movement, Good air movement bilaterally, CTAB RRR,No Gallops, Rubs or new Murmurs,  +ve B.Sounds, Abd Soft, No tenderness,   No Cyanosis, Clubbing or edema    Assessment/Plan:  Acute diverticulitis with contained perforation of the descending colon Initially was managed conservatively, subsequently 2/8>> by Dr. Freida Busman general surgery she underwent exploratory laparotomy with Hartman's resection for perforated diverticulitis with diffuse peritonitis, colostomy in place, continue Zosyn, continue NG tube, defer further management of this issue to General surgery Case discussed with general surgery team on 11/05/2023, monitor closely on present treatment, if leukocytosis continues to get worse would likely reimage with CT in the next day or 2, H&H is stable has developed significant [place] ptosis on 11/06/2023, repeat CT noted, case discussed with general surgery for now wait and watch and monitor.   AKI on CKD 5 with progression to ESRD AKI likely hemodynamically mediated-has now likely progressed to ESRD.  HD initiated via right IJ temporary catheter.  CAD Currently no anginal symptoms Suspect not on antiplatelets as on anticoagulation with Eliquis/heparin Continue beta-blocker On Repatha injections as an outpatient.  PAF Sinus rhythm Continue Coreg Will resume IV heparin with caution on 11/06/2023.  Continue to monitor H&H.  Anemia of acute illness  Brought on by acute illness, underlying renal insufficiency now developing to ESRD, surgical blood loss, gradually trending down, 2 units of packed RBC on 11/04/2023, on IV PPI. CBC looks stable on 11/05/2023 will continue to monitor closely.  HTN BP stable-continue  Coreg/amlodipine.  DM-2 (A1c 6.6 on 2/3) CBGs relatively stable  on SSI-follow/optimize as diet get started back on.  Recent Labs    11/05/23 1550 11/05/23 2116 11/06/23 0755  GLUCAP 224* 216* 250*    Palliative care/goals of care Initially DNR-and wanted to do comfort care, now is full code wants to undergo aggressive treatment including surgery and dialysis.  Full code now.  Class 1 Obesity: Estimated body mass index is 33.42 kg/m as calculated from the following:   Height as of this encounter: 5' 7.01" (1.702 m).   Weight as of this encounter: 96.8 kg.    Code status:   Code Status: Full Code   DVT Prophylaxis: Place and maintain sequential compression device Start: 11/06/23 0613 IV Heparin  Family Communication:  [name] [phone] updated 2/7  Disposition Plan: Status is: Inpatient Remains inpatient appropriate because: Severity of illness   Planned Discharge Destination:Residential hospice   Diet: Diet Order             Diet NPO time specified Except for: Sips with Meds  Diet effective now                   MEDICATIONS: Scheduled Meds:  sodium chloride   Intravenous Once   sodium chloride   Intravenous Once   Chlorhexidine Gluconate Cloth  6 each Topical Q0600   insulin aspart  0-6 Units Subcutaneous TID WC   lidocaine-EPINEPHrine (PF)  10 mL Infiltration Once   mouth rinse  15 mL Mouth Rinse 4 times per day   pantoprazole (PROTONIX) IV  40 mg Intravenous Q12H   sodium chloride flush  10-40 mL Intracatheter Q12H   Continuous Infusions:  heparin 1,400 Units/hr (11/06/23 1021)   iron sucrose     piperacillin-tazobactam (ZOSYN)  IV Stopped (11/06/23 1010)   PRN Meds:.acetaminophen **OR** acetaminophen, albuterol, hydrALAZINE, HYDROmorphone (DILAUDID) injection, menthol-cetylpyridinium, ondansetron (ZOFRAN) IV, mouth rinse   I have personally reviewed following labs and imaging studies  LABORATORY DATA:   Data Review:    Recent Labs   Lab 11/02/23 0133 11/03/23 0310 11/04/23 0209 11/05/23 0247 11/06/23 0345  WBC 16.0* 14.2* 19.0* 24.7* 26.8*  HGB 8.5* 7.8* 6.9* 9.1* 9.2*  HCT 26.5* 23.5* 20.7* 27.4* 27.9*  PLT 373 313 317 316 309  MCV 80.1 78.6* 77.8* 79.9* 79.7*  MCH 25.7* 26.1 25.9* 26.5 26.3  MCHC 32.1 33.2 33.3 33.2 33.0  RDW 14.7 14.7 14.6 15.4 15.8*  LYMPHSABS  --  0.7 0.9 1.1 0.5*  MONOABS  --  1.6* 1.9* 2.1* 2.4*  EOSABS  --  0.3 0.1 0.1 0.3  BASOSABS  --  0.1 0.1 0.1 0.0    Recent Labs  Lab 10/31/23 0503 11/01/23 0532 11/02/23 0133 11/03/23 0310 11/04/23 0209 11/04/23 1455 11/05/23 0247 11/06/23 0345  NA 134* 129* 132* 133*  --  136 135 134*  K 4.1 4.6 4.2 4.0  --  3.9 3.9 3.8  CL 102 97* 97* 95*  --  96* 94* 93*  CO2 18* 14* 17* 19*  --  20* 21* 21*  ANIONGAP 14 18* 18* 19*  --  22* 20* 20*  GLUCOSE 152* 236* 212* 193*  --  224* 194* 254*  BUN 100* 106* 94* 65*  --  81* 47* 62*  CREATININE 9.58* 10.48* 9.20* 7.34*  --  9.18* 6.10* 7.45*  AST  --   --  8*  --   --   --   --   --   ALT  --   --  9  --   --   --   --   --  ALKPHOS  --   --  15*  --   --   --   --   --   BILITOT  --   --  1.2  --   --   --   --   --   ALBUMIN 2.4* 2.2* 2.0* 2.1*  --  1.8*  --   --   CRP  --   --   --  22.7* 24.8*  --  23.5* 19.3*  PROCALCITON  --   --   --  41.53 42.39  --  30.19 25.65  TSH  --   --   --  1.046  --   --   --   --   BNP  --   --   --  78.5 74.4  --  179.0* 244.4*  MG  --   --   --  2.1 2.3  --  2.1 2.3  PHOS 6.1* 7.6*  --  6.5*  --  7.0*  --   --   CALCIUM 7.6* 7.6* 7.9* 7.5*  --  7.4* 7.7* 7.5*      Recent Labs  Lab 11/02/23 0133 11/03/23 0310 11/04/23 0209 11/04/23 1455 11/05/23 0247 11/06/23 0345  CRP  --  22.7* 24.8*  --  23.5* 19.3*  PROCALCITON  --  41.53 42.39  --  30.19 25.65  TSH  --  1.046  --   --   --   --   BNP  --  78.5 74.4  --  179.0* 244.4*  MG  --  2.1 2.3  --  2.1 2.3  CALCIUM 7.9* 7.5*  --  7.4* 7.7* 7.5*     --------------------------------------------------------------------------------------------------------------- Lab Results  Component Value Date   CHOL 134 12/21/2019   HDL 49 12/21/2019   LDLCALC 69 12/21/2019   TRIG 82 12/21/2019   CHOLHDL 2.7 12/21/2019    Lab Results  Component Value Date   HGBA1C 6.6 (H) 10/28/2023   No results for input(s): "TSH", "T4TOTAL", "FREET4", "T3FREE", "THYROIDAB" in the last 72 hours.  No results for input(s): "VITAMINB12", "FOLATE", "FERRITIN", "TIBC", "IRON", "RETICCTPCT" in the last 72 hours. ------------------------------------------------------------------------------------------------------------------ Cardiac Enzymes No results for input(s): "CKMB", "TROPONINI", "MYOGLOBIN" in the last 168 hours.  Invalid input(s): "CK"  Micro Results Recent Results (from the past 240 hours)  Resp panel by RT-PCR (RSV, Flu A&B, Covid) Anterior Nasal Swab     Status: None   Collection Time: 10/27/23  7:18 PM   Specimen: Anterior Nasal Swab  Result Value Ref Range Status   SARS Coronavirus 2 by RT PCR NEGATIVE NEGATIVE Final   Influenza A by PCR NEGATIVE NEGATIVE Final   Influenza B by PCR NEGATIVE NEGATIVE Final    Comment: (NOTE) The Xpert Xpress SARS-CoV-2/FLU/RSV plus assay is intended as an aid in the diagnosis of influenza from Nasopharyngeal swab specimens and should not be used as a sole basis for treatment. Nasal washings and aspirates are unacceptable for Xpert Xpress SARS-CoV-2/FLU/RSV testing.  Fact Sheet for Patients: BloggerCourse.com  Fact Sheet for Healthcare Providers: SeriousBroker.it  This test is not yet approved or cleared by the [place] FDA and has been authorized for detection and/or diagnosis of SARS-CoV-2 by FDA under an Emergency Use Authorization (EUA). This EUA will remain in effect (meaning this test can be used) for the duration of the COVID-19 declaration  under Section 564(b)(1) of the Act, 21 U.S.C. section 360bbb-3(b)(1), unless the authorization is terminated or revoked.     Resp Syncytial  Virus by PCR NEGATIVE NEGATIVE Final    Comment: (NOTE) Fact Sheet for Patients: BloggerCourse.com  Fact Sheet for Healthcare Providers: SeriousBroker.it  This test is not yet approved or cleared by the [place] FDA and has been authorized for detection and/or diagnosis of SARS-CoV-2 by FDA under an Emergency Use Authorization (EUA). This EUA will remain in effect (meaning this test can be used) for the duration of the COVID-19 declaration under Section 564(b)(1) of the Act, 21 U.S.C. section 360bbb-3(b)(1), unless the authorization is terminated or revoked.  Performed at [facility], [address], [place], [place] [address]     Radiology Reports CT ABDOMEN PELVIS WO CONTRAST Result Date: 11/06/2023 CLINICAL DATA:  Diverticulitis, complication suspected EXAM: CT ABDOMEN AND PELVIS WITHOUT CONTRAST TECHNIQUE: Multidetector CT imaging of the abdomen and pelvis was performed following the standard protocol without IV contrast. RADIATION DOSE REDUCTION: This exam was performed according to the departmental dose-optimization program which includes automated exposure control, adjustment of the mA and/or kV according to patient size and/or use of iterative reconstruction technique. COMPARISON:  10/31/2023 FINDINGS: Lower chest: Volume loss and opacity in the lower lungs consistent with atelectasis. There is a central line with tip at the upper right atrium. Coronary atherosclerosis. Hepatobiliary: No focal liver abnormality. Calcific in the intermediate density in the gallbladder attributed to stones. No biliary dilatation or pericholecystic edema. Pancreas: Unremarkable. Spleen: Unremarkable. Adrenals/Urinary Tract: Negative adrenals. No hydronephrosis or stone. Renal cortical thinning and  lobulation at the left upper pole. 3 mm stone at the upper pole left kidney. The urinary bladder is collapsed around a Foley catheter. Stomach/Bowel: Descending colostomy. Bowel anastomosis in the right abdomen. No evidence of bowel obstruction. Fluid collection with loculation appearance/peritoneal thickening along the left flank measuring up to 5 x 6 x 10 cm. Milder fluid accumulation in the ventral peritoneal space. Vascular/Lymphatic: Extensive atheromatous calcification of the aorta and iliacs. No mass or adenopathy. Reproductive:No pathologic findings. Other: No ascites or pneumoperitoneum. Musculoskeletal: No acute abnormalities.  T9 butterfly vertebra IMPRESSION: 1. Interval colostomy with fluid collection along the left gutter, dominant pocket measuring 10 x 6 x 5 cm. 2. No bowel obstruction. 3. Cholelithiasis and left nephrolithiasis. Electronically Signed   By: Tiburcio Pea M.D.   On: 11/06/2023 07:12   DG Chest Port 1 View Result Date: 11/03/2023 CLINICAL DATA:  Shortness of breath EXAM: PORTABLE CHEST - 1 VIEW COMPARISON:  11/11/2018 FINDINGS: Unchanged mild cardiomegaly and pulmonary vascular congestion. Interval worsening of left basilar opacity likely due to atelectasis. Lungs otherwise clear. Examination is limited due to expiratory phase of imaging. Interval placement of temporary right IJ hemodialysis catheter which terminates in the region of the right atrium. Nasogastric tube terminates in the left upper quadrant. IMPRESSION: Interval worsening of left basilar opacity which is likely due to atelectasis. Electronically Signed   By: Acquanetta Belling M.D.   On: 11/03/2023 07:22   IR Fluoro Guide CV Line Right Result Date: 11/01/2023 INDICATION: ESRD requiring HD. EXAM: NON-TUNNELED CENTRAL VENOUS HEMODIALYSIS CATHETER PLACEMENT WITH ULTRASOUND AND FLUOROSCOPIC GUIDANCE COMPARISON:  CT AP 10/31/2023. MEDICATIONS: Local anesthetic was administered. FLUOROSCOPY TIME:  Fluoroscopic dose; 35 mGy  COMPLICATIONS: None immediate. PROCEDURE: Informed written consent was obtained from the patient and/or patient's representative after a discussion of the risks, benefits, and alternatives to treatment. Questions regarding the procedure were encouraged and answered. The RIGHT neck and chest were prepped with chlorhexidine in a sterile fashion, and a sterile drape was applied covering the operative field.  Maximum barrier sterile technique with sterile gowns and gloves were used for the procedure. A timeout was performed prior to the initiation of the procedure. After the overlying soft tissues were anesthetized, a small venotomy incision was created and a micropuncture kit was utilized to access the external jugular vein. Real-time ultrasound guidance was utilized for vascular access including the acquisition of a permanent ultrasound image documenting patency of the accessed vessel. The microwire was utilized to measure appropriate catheter length. A stiff glidewire was advanced to the level of the IVC. Under fluoroscopic guidance, the venotomy was serially dilated, ultimately allowing placement of a 20 cm temporary Trialysis catheter with tip ultimately terminating within the superior aspect of the right atrium. Final catheter positioning was confirmed and documented with a spot radiographic image. The catheter aspirates and flushes normally. The catheter was flushed with appropriate volume heparin dwells. The catheter exit site was secured with a 2-0 Ethilon retention suture. A dressing was placed. The patient tolerated the procedure well without immediate post procedural complication. IMPRESSION: Successful placement of a RIGHT EXTERNAL jugular approach 20 cm non-tunneled dialysis catheter The tip of the catheter is positioned within the proximal RIGHT atrium. The catheter is ready for immediate use. PLAN: This catheter may be converted to a tunneled dialysis catheter at a later date as indicated. Roanna Banning, MD  [facility] Electronically Signed   By: Roanna Banning M.D.   On: 11/01/2023 18:15   IR US Guide Vasc Access Right Result Date: 11/01/2023 INDICATION: ESRD requiring HD. EXAM: NON-TUNNELED CENTRAL VENOUS HEMODIALYSIS CATHETER PLACEMENT WITH ULTRASOUND AND FLUOROSCOPIC GUIDANCE COMPARISON:  CT AP 10/31/2023. MEDICATIONS: Local anesthetic was administered. FLUOROSCOPY TIME:  Fluoroscopic dose; 35 mGy COMPLICATIONS: None immediate. PROCEDURE: Informed written consent was obtained from the patient and/or patient's representative after a discussion of the risks, benefits, and alternatives to treatment. Questions regarding the procedure were encouraged and answered. The RIGHT neck and chest were prepped with chlorhexidine in a sterile fashion, and a sterile drape was applied covering the operative field. Maximum barrier sterile technique with sterile gowns and gloves were used for the procedure. A timeout was performed prior to the initiation of the procedure. After the overlying soft tissues were anesthetized, a small venotomy incision was created and a micropuncture kit was utilized to access the external jugular vein. Real-time ultrasound guidance was utilized for vascular access including the acquisition of a permanent ultrasound image documenting patency of the accessed vessel. The microwire was utilized to measure appropriate catheter length. A stiff glidewire was advanced to the level of the IVC. Under fluoroscopic guidance, the venotomy was serially dilated, ultimately allowing placement of a 20 cm temporary Trialysis catheter with tip ultimately terminating within the superior aspect of the right atrium. Final catheter positioning was confirmed and documented with a spot radiographic image. The catheter aspirates and flushes normally. The catheter was flushed with appropriate volume heparin dwells. The catheter exit site was secured with a 2-0 Ethilon  retention suture. A dressing was placed. The patient tolerated the procedure well without immediate post procedural complication. IMPRESSION: Successful placement of a RIGHT EXTERNAL jugular approach 20 cm non-tunneled dialysis catheter The tip of the catheter is positioned within the proximal RIGHT atrium. The catheter is ready for immediate use. PLAN: This catheter may be converted to a tunneled dialysis catheter at a later date as indicated. Roanna Banning, MD [facility] Electronically Signed   By: Gerome Sam.D.  On: 11/01/2023 18:15   CT ABDOMEN PELVIS WO CONTRAST Result Date: 10/31/2023 CLINICAL DATA:  Diverticulitis, complication suspected. EXAM: CT ABDOMEN AND PELVIS WITHOUT CONTRAST TECHNIQUE: Multidetector CT imaging of the abdomen and pelvis was performed following the standard protocol without IV contrast. RADIATION DOSE REDUCTION: This exam was performed according to the departmental dose-optimization program which includes automated exposure control, adjustment of the mA and/or kV according to patient size and/or use of iterative reconstruction technique. COMPARISON:  10/27/2023. FINDINGS: Lower chest: Heart is enlarged and multi-vessel coronary artery calcifications are noted. Consolidation and patchy airspace disease is present at the lung bases bilaterally. Hepatobiliary: No focal liver abnormality is seen. Stones are present within the gallbladder. No biliary ductal dilatation. Pancreas: Unremarkable. No pancreatic ductal dilatation or surrounding inflammatory changes. Spleen: Normal in size without focal abnormality. Adrenals/Urinary Tract: The adrenal glands are within normal limits. Calcifications are present in the upper pole of the left kidney. There is a cyst in the lower pole of the right kidney. No hydronephrosis bilaterally. The bladder is unremarkable. Stomach/Bowel: The stomach is within normal limits. No bowel obstruction or  pneumatosis is seen. Right hemicolectomy changes are noted. There is diffuse colonic wall thickening. Scattered diverticula are present along the colon. A few small foci of free air are noted in the mesentery in the mid left abdomen, slightly increased from the previous exam. There is suggestion of contrast extravasation in the region. There is fine no definite abscess is seen. Vascular/Lymphatic: Aortic atherosclerosis. No enlarged abdominal or pelvic lymph nodes. Reproductive: No acute abnormality. Other: Mild ascites is noted in all 4 quadrants. A fat containing umbilical hernia is noted. There is mild anasarca. Musculoskeletal: The bony structures are stable. IMPRESSION: 1. Worsening diverticulitis of the descending colon with perforation and increasing free air and suspected oral contrast extravasation in the mid left abdomen. No definite abscess is seen. Surgical consultation is recommended. 2. Mild ascites. 3. Diffuse colonic wall thickening which may be due to associated inflammatory changes. 4. Patchy airspace disease at the lung bases with bilateral lower lobe consolidation. 5. Cholelithiasis. 6. Aortic atherosclerosis. Critical Value/emergent results were called by telephone at the time of interpretation on 10/31/2023 at 11:59 pm to provider Dr. Antionette Char, who verbally acknowledged these results. Electronically Signed   By: Thornell Sartorius M.D.   On: 10/31/2023 23:59   CT ABDOMEN PELVIS WO CONTRAST Result Date: 10/27/2023 CLINICAL DATA:  Acute nonlocalized abdominal pain. Chills and fever. Vomiting and diarrhea. EXAM: CT ABDOMEN AND PELVIS WITHOUT CONTRAST TECHNIQUE: Multidetector CT imaging of the abdomen and pelvis was performed following the standard protocol without IV contrast. RADIATION DOSE REDUCTION: This exam was performed according to the departmental dose-optimization program which includes automated exposure control, adjustment of the mA and/or kV according to patient size and/or use of iterative  reconstruction technique. COMPARISON:  CT abdomen and pelvis 04/19/2021 FINDINGS: Lower chest: Bibasilar atelectasis.  No acute abnormality. Hepatobiliary: Cholelithiasis without evidence of acute cholecystitis. No biliary dilation. Unremarkable noncontrast appearance of the liver. Pancreas: Unremarkable. Spleen: Unremarkable. Adrenals/Urinary Tract: Normal adrenal glands. No urinary calculi or hydronephrosis. Unremarkable bladder. Stomach/Bowel: Stomach is within normal limits. No bowel obstruction. Colonic diverticulosis. Wall thickening, inflammatory stranding, and free fluid about the descending colon. Loosely organized fluid and gas about the inflamed descending colon (circa series 3/image [id number]) compatible with contained perforation. The adjacent jejunum is inflamed. Postoperative change about the colon with ileocolonic anastomosis in the right upper quadrant. Vascular/Lymphatic: Aortic atherosclerosis. No enlarged abdominal or pelvic lymph nodes. Reproductive: No  acute abnormality. Other: Fat containing periumbilical hernia. Musculoskeletal: No acute fracture. IMPRESSION: 1. Acute diverticulitis of the descending colon with contained perforation. 2. Inflamed jejunum adjacent to the perforated diverticulitis. 3.  Aortic Atherosclerosis (ICD10-I70.0). Critical Value/emergent results were called by telephone at the time of interpretation on 10/27/2023 at 9:22 pm to provider [facility] , who verbally acknowledged these results. Electronically Signed   By: Minerva Fester M.D.   On: 10/27/2023 21:24    Signature  -   Susa Raring M.D on 11/06/2023 at 11:02 AM   -  To page go to [email or web address]

## 2023-11-06 NOTE — Progress Notes (Signed)
PHARMACY - ANTICOAGULATION CONSULT NOTE  Pharmacy Consult:  Heparin Indication: atrial fibrillation  Allergies  Allergen Reactions   Nsaids Other (See Comments)    CKD stage 4   Lisinopril Other (See Comments)    coughing   Peanut-Containing Drug Products Itching and Other (See Comments)    GI intolerance - diarrhea    Patient Measurements: Height: 5' 7.01" (170.2 cm) Weight: 102.9 kg (226 lb 13.7 oz) IBW/kg (Calculated) : 61.62 Heparin Dosing Weight: 83 kg  Vital Signs: Temp: 98 F (36.7 C) (02/12 1712) Temp Source: Oral (02/12 1712) BP: 151/62 (02/12 2013) Pulse Rate: 74 (02/12 2013)  Labs: Recent Labs    11/04/23 0209 11/04/23 1455 11/05/23 0247 11/06/23 0345 11/06/23 1950  HGB 6.9*  --  9.1* 9.2*  --   HCT 20.7*  --  27.4* 27.9*  --   PLT 317  --  316 309  --   HEPARINUNFRC 0.49  --   --   --  0.29*  CREATININE  --  9.18* 6.10* 7.45*  --     Estimated Creatinine Clearance: 8.9 mL/min (A) (by C-G formula based on SCr of 7.45 mg/dL (H)).  Medications:  PTA apixaban 2.5mg  PO BID (last dose 10/27/2023 AM)  Assessment: 25 yoF presents with abdominal pain with complicated diverticulitis w/ contained perforation and possible need for colectomy. PMH: CKD 5, A fib (apixaban), CAD, and CVA.  Pharmacy consulted to dose IV heparin for Afib. 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   Heparin level was therapeutic (0.49) on infusion at 1800 units/hr this morning. Overnight Hgb dropped down to 6.9 and she received 1 unit of PRBC. No apparent GI bleeding noted, but heparin is being held while she is evaluated for bleeding.    2/12 update:  Hgb stable x 2 days  Re-starting heparin PM heparin level 0.29    Goal of Therapy:  Heparin level 0.3-0.7 units/ml Monitor platelets by anticoagulation protocol: Yes   Plan:  Heparin to 1500 units / hr Monitor for bleeding, Trend Hgb  Thank you Okey Regal, PharmD

## 2023-11-06 NOTE — Progress Notes (Signed)
Patient is alert, awake , wound vac needed adjust sometime during the night. BP 175/ 69 was given 10 mg IV hydralazine. Colostomy site is dry intact, 20 ml/ soft liquid out put.

## 2023-11-06 NOTE — Progress Notes (Signed)
Patient tolerated NG Tube clamping trial, no complaints of n/v, pain.

## 2023-11-06 NOTE — Plan of Care (Signed)
Problem: Coping: Goal: Ability to adjust to condition or change in health will improve Outcome: Progressing   Problem: Metabolic: Goal: Ability to maintain appropriate glucose levels will improve Outcome: Progressing   Problem: Skin Integrity: Goal: Risk for impaired skin integrity will decrease Outcome: Progressing   Problem: Education: Goal: Knowledge of General Education information will improve Description: Including pain rating scale, medication(s)/side effects and non-pharmacologic comfort measures Outcome: Progressing

## 2023-11-06 NOTE — Progress Notes (Addendum)
Initial Nutrition Assessment  DOCUMENTATION CODES:   Obesity unspecified  INTERVENTION:   -RD will follow for diet advancement and add supplements as appropriate -Pt has NPO/ clear liquids for entire hospital admission (10 days); discussed concerns with RN, MD, and general surgery; plan for clamping trial today- MD hopeful to transition to PO diet tomorrow; RD discussed recommendation of TPN if unable to advance diet quickly -If TPN is initiated, pt is at high refeeding risk; recommend monitoring Mg, K, and Phos and replete as needed as well as add MVI  daily x 10 days and 100 mg thiamine daily x 7 days  NUTRITION DIAGNOSIS:   Increased nutrient needs related to post-op healing as evidenced by estimated needs.  GOAL:   Patient will meet greater than or equal to 90% of their needs  MONITOR:   Diet advancement  REASON FOR ASSESSMENT:   NPO/Clear Liquid Diet    ASSESSMENT:   Pt with hx of CKD 5 following with [facility] for evaluation for renal transplant, atrial fibrillation on anticoagulation, CAD, NSTEMI, PAD, CVA, diabetes type 2, hypertension, hyperlipidemia, anemia, who presented for acute onset left lower quadrant abdominal pain. She has a history of partial right colectomy for polyps reported noncancerous, remote history of partial hysterectomy. Found to have complicated diverticulitis with contained perforation.  Pt admitted with complicated diverticulitis with contained perforation.    2/4- advanced to clear liquid diet 2/5- CT revealed no abscess 2/6- per nephrology notes, pt refusing long term dialysis 2/7- NPO, CT revealed worsening diverticulitis with increased free air and suspected extravasation of oral contrast, s/p non-tunneled HD cath placement, first HD treatment 2/8- s/p Procedure: Exploratory laparotomy, left colectomy with mobilization of the splenic flexure, and end colostomy placement   Reviewed I/O's: -460 ml x 24 hours and +1.4 L since admission  NGT  output: 400 ml x 24 hours  Colostomy output: 60 ml x 24 hours  Pt unavailable at time of visit. Attempted to speak with pt via call to hospital room phone, however, unable to reach. RD unable to obtain further nutrition-related history or complete nutrition-focused physical exam at this time.    CWOCN following for ostomy care/ education and wound vac dressing changes (scheduled for Monday and Thursday).   Per general surgery notes, some ostomy output today. Plan for NGT clamping trials. NGT output decreasing. Noted CT revealed fluid and lt gutter (possibly irrigant fluid from surgery); plan for continuing antibiotics and monitoring at this time.   Reviewed wt hx; pt has experienced a 1.5% wt loss over the past 10 months, which is not significant for time frame. Per nursing assessment, pt with mild edema, which may be masking true weight loss and as well fat and muscle depletions.   Case discussed with RN, MD, and general surgery. Discussed concern regarding prolonged period of inadequate nutrition (mostly NPO for 10 day hospital stay). RD recommended consideration of TPN, especially if pt does not do well with clamping trial or suspect prolonged NPO status, may need to consider initiation of TPN. Per discussion with MD, suspect pt will transition to PO diet tomorrow (11/07/23). Pt is at high refeeding risk.   Per discussion with RN, NGT improved and pt with no complaints regarding NGT clamping trial thus far.   Per CIR admissions coordinator notes, plan for potential admission.   Medications reviewed and include protonix.   Per palliative care notes on 10/31/23, pt reports that if renal function were to worsen, she would not want dialysis and would be  amenable to shift towards comfort care. On 11/01/23, pt desires full code and is amenable to HD.   Lab Results  Component Value Date   HGBA1C 6.6 (H) 10/28/2023   PTA DM medications are 10-15 units insulin glargine daily and 5-10 units insulin  regular TID before meals.   Labs reviewed: Na: 134, CBGS: 203-250 (inpatient orders for glycemic control are 0-6 units insulin aspart TID with meals).    Diet Order:   Diet Order             Diet NPO time specified Except for: Sips with Meds  Diet effective now                   EDUCATION NEEDS:   No education needs have been identified at this time  Skin:  Skin Assessment: Skin Integrity Issues: Skin Integrity Issues:: Wound VAC Wound Vac: abdomen  Last BM:  11/05/23 (60 ml via colostomy)  Height:   Ht Readings from Last 1 Encounters:  11/02/23 5' 7.01" (1.702 m)    Weight:   Wt Readings from Last 1 Encounters:  11/04/23 (S) 96.8 kg    Ideal Body Weight:  61.4 kg  BMI:  Body mass index is 33.42 kg/m.  Estimated Nutritional Needs:   Kcal:  2250-2450  Protein:  120-135 grams  Fluid:  1000 ml + UOP    Levada Schilling, RD, LDN, CDCES Registered Dietitian III Certified Diabetes Care and Education Specialist If unable to reach this RD, please use "RD Inpatient" group chat on secure chat between hours of 8am-4 pm daily

## 2023-11-06 NOTE — Progress Notes (Signed)
4 Days Post-Op  Subjective: CT scan ordered overnight by primary service I believe.  Patient having some abdominal pain, but just got moved off and on bed/CT table.  Colostomy working some.  NGT output has gone down.  Only about 400cc from last 24 hrs  Objective: Vital signs in last 24 hours: Temp:  [97.4 F (36.3 C)-98 F (36.7 C)] 97.8 F (36.6 C) (02/12 0758) Pulse Rate:  [70-83] 76 (02/12 0850) Resp:  [8-21] 15 (02/12 0850) BP: (142-191)/(58-74) 148/58 (02/12 0850) SpO2:  [94 %-98 %] 94 % (02/12 0850) Last BM Date : 10/31/23  Intake/Output from previous day: 02/11 0701 - 02/12 0700 In: -  Out: 460 [Emesis/NG output:400; Stool:60] Intake/Output this shift: No intake/output data recorded.  PE: Gen:  NAD Abd: soft, appropriately tender as expected overall.  Most tender in lower abdomen and right side on exam.  Colostomy with some liquid stool present.  Stoma is viable.  NGT with about 200cc of bilious output.  Midline wound VAC in place and holding suction well.  Lab Results:  Recent Labs    11/05/23 0247 11/06/23 0345  WBC 24.7* 26.8*  HGB 9.1* 9.2*  HCT 27.4* 27.9*  PLT 316 309   BMET Recent Labs    11/05/23 0247 11/06/23 0345  NA 135 134*  K 3.9 3.8  CL 94* 93*  CO2 21* 21*  GLUCOSE 194* 254*  BUN 47* 62*  CREATININE 6.10* 7.45*  CALCIUM 7.7* 7.5*   PT/INR No results for input(s): "LABPROT", "INR" in the last 72 hours. CMP     Component Value Date/Time   NA 134 (L) 11/06/2023 0345   NA 137 02/28/2021 1548   K 3.8 11/06/2023 0345   CL 93 (L) 11/06/2023 0345   CO2 21 (L) 11/06/2023 0345   GLUCOSE 254 (H) 11/06/2023 0345   BUN 62 (H) 11/06/2023 0345   BUN 33 (H) 02/28/2021 1548   CREATININE 7.45 (H) 11/06/2023 0345   CREATININE 3.38 (H) 12/26/2015 1347   CALCIUM 7.5 (L) 11/06/2023 0345   PROT 6.0 (L) 11/02/2023 0133   PROT 7.0 11/07/2017 1414   ALBUMIN 1.8 (L) 11/04/2023 1455   ALBUMIN 4.0 11/07/2017 1414   AST 8 (L) 11/02/2023 0133   ALT  9 11/02/2023 0133   ALKPHOS 29 (L) 11/02/2023 0133   BILITOT 1.2 11/02/2023 0133   BILITOT 0.2 11/07/2017 1414   GFRNONAA 6 (L) 11/06/2023 0345   GFRNONAA 13 (L) 01/10/2015 1428   GFRAA 21 (L) 12/21/2019 1802   GFRAA 16 (L) 01/10/2015 1428   Lipase     Component Value Date/Time   LIPASE 26 10/27/2023 1936    Studies/Results: CT ABDOMEN PELVIS WO CONTRAST Result Date: 11/06/2023 CLINICAL DATA:  Diverticulitis, complication suspected EXAM: CT ABDOMEN AND PELVIS WITHOUT CONTRAST TECHNIQUE: Multidetector CT imaging of the abdomen and pelvis was performed following the standard protocol without IV contrast. RADIATION DOSE REDUCTION: This exam was performed according to the departmental dose-optimization program which includes automated exposure control, adjustment of the mA and/or kV according to patient size and/or use of iterative reconstruction technique. COMPARISON:  10/31/2023 FINDINGS: Lower chest: Volume loss and opacity in the lower lungs consistent with atelectasis. There is a central line with tip at the upper right atrium. Coronary atherosclerosis. Hepatobiliary: No focal liver abnormality. Calcific in the intermediate density in the gallbladder attributed to stones. No biliary dilatation or pericholecystic edema. Pancreas: Unremarkable. Spleen: Unremarkable. Adrenals/Urinary Tract: Negative adrenals. No hydronephrosis or stone. Renal cortical thinning and  lobulation at the left upper pole. 3 mm stone at the upper pole left kidney. The urinary bladder is collapsed around a Foley catheter. Stomach/Bowel: Descending colostomy. Bowel anastomosis in the right abdomen. No evidence of bowel obstruction. Fluid collection with loculation appearance/peritoneal thickening along the left flank measuring up to 5 x 6 x 10 cm. Milder fluid accumulation in the ventral peritoneal space. Vascular/Lymphatic: Extensive atheromatous calcification of the aorta and iliacs. No mass or adenopathy. Reproductive:No  pathologic findings. Other: No ascites or pneumoperitoneum. Musculoskeletal: No acute abnormalities.  T9 butterfly vertebra IMPRESSION: 1. Interval colostomy with fluid collection along the left gutter, dominant pocket measuring 10 x 6 x 5 cm. 2. No bowel obstruction. 3. Cholelithiasis and left nephrolithiasis. Electronically Signed   By: Tiburcio Pea M.D.   On: 11/06/2023 07:12     Anti-infectives: Anti-infectives (From admission, onward)    Start     Dose/Rate Route Frequency Ordered Stop   11/06/23 0815  piperacillin-tazobactam (ZOSYN) IVPB 2.25 g        2.25 g 100 mL/hr over 30 Minutes Intravenous Every 8 hours 11/06/23 0729     11/01/23 1415  piperacillin-tazobactam (ZOSYN) IVPB 2.25 g        2.25 g 100 mL/hr over 30 Minutes Intravenous Every 8 hours 11/01/23 1316 11/06/23 0602   10/29/23 1100  piperacillin-tazobactam (ZOSYN) IVPB 2.25 g  Status:  Discontinued        2.25 g 100 mL/hr over 30 Minutes Intravenous Every 8 hours 10/29/23 1006 11/01/23 1016   10/28/23 0600  piperacillin-tazobactam (ZOSYN) IVPB 2.25 g  Status:  Discontinued        2.25 g 100 mL/hr over 30 Minutes Intravenous Every 8 hours 10/27/23 2224 10/27/23 2235   10/28/23 0600  ceFEPIme (MAXIPIME) 1 g in sodium chloride 0.9 % 100 mL IVPB  Status:  Discontinued        1 g 200 mL/hr over 30 Minutes Intravenous Every 24 hours 10/27/23 2235 10/29/23 0954   10/28/23 0600  metroNIDAZOLE (FLAGYL) IVPB 500 mg  Status:  Discontinued        500 mg 100 mL/hr over 60 Minutes Intravenous Every 12 hours 10/27/23 2235 10/29/23 0954   10/27/23 2130  piperacillin-tazobactam (ZOSYN) IVPB 3.375 g        3.375 g 100 mL/hr over 30 Minutes Intravenous  Once 10/27/23 2128 10/27/23 2320        Assessment/Plan  POD 4, s/p Hartmann's by Dr. Freida Busman 11/02/23 forSigmoid diverticulitis with perforation  -cont abx therapy for 4 days post op, but may need to extend as WBC rising, but remains AF -clamp NGT today and see how she does,  colostomy function improving -hgb 9.2 -therapies for mobilization. -VAC to midline wound, will change M/Th -WOC for colostomy care -CT scan from this morning reviewed.  There is some fluid in the left gutter.  This is only 4 days out from surgery.  This could very easily just be irrigant fluid from surgery.  It is difficult to truly determine whether this has meaning towards her rising WBC or not.  She remains AF. Would recommend continuing abx therapy for now and monitoring her.  No plans for a drain at this time.  Bowels are also starting to function and look non-dilated on CT scan which is a good sign as well.  FEN: NPO/NGT, clamping trial today/IVFs per primary/renal VTE: heparin gtt - hgb stable ID: cefepime/flagyl 2/3>2/4; Zosyn 2/4>>   - per TRH -  PAF  HTN HLD  AKI on CKD stage V - on HD CAD T2DM    LOS: 10 days    Letha Cape , [facility] 11/06/2023, 8:52 AM Please see Amion for pager number during day hours 7:00am-4:30pm

## 2023-11-06 NOTE — Plan of Care (Signed)
Problem: Education: Goal: Ability to describe self-care measures that may prevent or decrease complications (Diabetes Survival Skills Education) will improve Outcome: Progressing   Problem: Fluid Volume: Goal: Ability to maintain a balanced intake and output will improve Outcome: Progressing   Problem: Nutritional: Goal: Maintenance of adequate nutrition will improve Outcome: Progressing

## 2023-11-06 NOTE — Progress Notes (Signed)
Patient ID: [name], female   DOB: Dec 08, 1955, 68 y.o.   MRN: [id number] [facility] Progress Note   Assessment/ Plan:   1.  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  After initial refusal of wanting to pursue renal replacement therapy, she changed her mind and asked to start dialysis with the intention of undergoing surgery for contained perforation of acute diverticulitis.   - #2 HD today -  third will likely be Fri or Sat - has non tunneled HD cath in for 4 days-  will need to be converted to [facility] next week if she still wants to continue - I had followed her as OP-  she had been heavily entertaining not doing dialysis.  I told her that she is in charge and she can stop at any time  "it doesn't feel like it "  2.  Acute diverticulitis with contained perforation of descending colon: On Zosyn and now status post exploratory laparotomy with Hartman's resection for perforated diverticulitis/peritonitis yesterday (2/8).  Surgical note reviewed from today along with wound assessment. 3.  Anion gap metabolic acidosis: Likely secondary to combination of acute kidney injury and ongoing infection, improved with dialysis. 4.  Anemia: Likely secondary to chronic illness, iron stores significantly low and yesterday started on intravenous iron for loading prior to starting ESA.  She is also status post PRBC transfusion  Subjective:    Sitting up in bedside chair-  pain is controlled.  Awaiting HD later today -  she says it  was long    Objective:   BP (!) 148/58   Pulse 76   Temp 97.8 F (36.6 C) (Oral)   Resp 15   Ht 5' 7.01" (1.702 m)   Wt (S) 96.8 kg Comment: Bed Scale  SpO2 94%   BMI 33.42 kg/m   Intake/Output Summary (Last 24 hours) at 11/06/2023 1139 Last data filed at 11/05/2023 1552 Gross per 24 hour  Intake --  Output 460 ml  Net  -460 ml   Weight change:   Physical Exam: Gen: Uncomfortably resting in bed, NGT in place with LIS and bilious drainage CVS: Pulse regular rhythm, normal rate, S1 and S2 normal Resp: Clear to auscultation bilaterally with diminished breath sounds over bases, no rales Abd: Protuberant with colostomy bag in situ.  (Wound assessment noted on surgical note) Ext: Trace ankle edema.  No asterixis/myoclonic jerks noted  Imaging: CT ABDOMEN PELVIS WO CONTRAST Result Date: 11/06/2023 CLINICAL DATA:  Diverticulitis, complication suspected EXAM: CT ABDOMEN AND PELVIS WITHOUT CONTRAST TECHNIQUE: Multidetector CT imaging of the abdomen and pelvis was performed following the standard protocol without IV contrast. RADIATION DOSE REDUCTION: This exam was performed according to the departmental dose-optimization program which includes automated exposure control, adjustment of the mA and/or kV according to patient size and/or use of iterative reconstruction technique. COMPARISON:  10/31/2023 FINDINGS: Lower chest: Volume loss and opacity in the lower lungs consistent with atelectasis. There is a central line with tip at the upper right atrium. Coronary atherosclerosis. Hepatobiliary: No focal liver abnormality. Calcific in the intermediate density in the gallbladder attributed to stones. No biliary dilatation or pericholecystic edema. Pancreas: Unremarkable. Spleen: Unremarkable. Adrenals/Urinary Tract: Negative adrenals. No hydronephrosis or stone. Renal cortical thinning and lobulation at the left upper pole. 3 mm stone at the upper pole left kidney. The urinary bladder is collapsed around a Foley catheter. Stomach/Bowel: Descending colostomy. Bowel  anastomosis in the right abdomen. No evidence of bowel obstruction. Fluid collection with loculation appearance/peritoneal thickening along the left flank measuring up to 5 x 6 x 10 cm. Milder fluid accumulation in the ventral peritoneal space. Vascular/Lymphatic: Extensive  atheromatous calcification of the aorta and iliacs. No mass or adenopathy. Reproductive:No pathologic findings. Other: No ascites or pneumoperitoneum. Musculoskeletal: No acute abnormalities.  T9 butterfly vertebra IMPRESSION: 1. Interval colostomy with fluid collection along the left gutter, dominant pocket measuring 10 x 6 x 5 cm. 2. No bowel obstruction. 3. Cholelithiasis and left nephrolithiasis. Electronically Signed   By: Tiburcio Pea M.D.   On: 11/06/2023 07:12     Labs: BMET Recent Labs  Lab 10/31/23 0503 11/01/23 0532 11/02/23 0133 11/03/23 0310 11/04/23 1455 11/05/23 0247 11/06/23 0345  NA 134* 129* 132* 133* 136 135 134*  K 4.1 4.6 4.2 4.0 3.9 3.9 3.8  CL 102 97* 97* 95* 96* 94* 93*  CO2 18* 14* 17* 19* 20* 21* 21*  GLUCOSE 152* 236* 212* 193* 224* 194* 254*  BUN 100* 106* 94* 65* 81* 47* 62*  CREATININE 9.58* 10.48* 9.20* 7.34* 9.18* 6.10* 7.45*  CALCIUM 7.6* 7.6* 7.9* 7.5* 7.4* 7.7* 7.5*  PHOS 6.1* 7.6*  --  6.5* 7.0*  --   --    CBC Recent Labs  Lab 11/03/23 0310 11/04/23 0209 11/05/23 0247 11/06/23 0345  WBC 14.2* 19.0* 24.7* 26.8*  NEUTROABS 10.4* 14.4* 19.5* 23.6*  HGB 7.8* 6.9* 9.1* 9.2*  HCT 23.5* 20.7* 27.4* 27.9*  MCV 78.6* 77.8* 79.9* 79.7*  PLT 313 317 316 309    Medications:     sodium chloride   Intravenous Once   sodium chloride   Intravenous Once   Chlorhexidine Gluconate Cloth  6 each Topical Q0600   insulin aspart  0-6 Units Subcutaneous TID WC   lidocaine-EPINEPHrine (PF)  10 mL Infiltration Once   mouth rinse  15 mL Mouth Rinse 4 times per day   pantoprazole (PROTONIX) IV  40 mg Intravenous Q12H   sodium chloride flush  10-40 mL Intracatheter Q12H   Delaine Canter A Maddox Hlavaty  11/06/2023, 11:39 AM

## 2023-11-06 NOTE — Progress Notes (Signed)
   11/06/23 1202  Mobility  Activity Transferred from chair to bed  Level of Assistance Minimal assist, patient does 75% or more  Assistive Device  (HHA)  Activity Response Tolerated well  Mobility Referral Yes  Mobility visit 1 Mobility  Mobility Specialist Start Time (ACUTE ONLY) 1147  Mobility Specialist Stop Time (ACUTE ONLY) 1202  Mobility Specialist Time Calculation (min) (ACUTE ONLY) 15 min   Mobility Specialist: Progress Note  Pre-Mobility:      HR 90,  SpO2 99% 2L Post-Mobility:    HR 96, SpO2 99% 2L  Pt agreeable to mobility session - received in chair. Pt was asymptomatic throughout session with no complaints. Returned to chair with all needs met - call bell within reach. Bed alarm on.   Barnie Mort, BS Mobility Specialist Please contact via SecureChat or  Rehab office at [phone].

## 2023-11-06 NOTE — Progress Notes (Signed)
PHARMACY - ANTICOAGULATION CONSULT NOTE  Pharmacy Consult:  Heparin Indication: atrial fibrillation  Allergies  Allergen Reactions   Nsaids Other (See Comments)    CKD stage 4   Lisinopril Other (See Comments)    coughing   Peanut-Containing Drug Products Itching and Other (See Comments)    GI intolerance - diarrhea    Patient Measurements: Height: 5' 7.01" (170.2 cm) Weight: (S) 96.8 kg (213 lb 6.5 oz) (Bed Scale) IBW/kg (Calculated) : 61.62 Heparin Dosing Weight: 83 kg  Vital Signs: Temp: 98 F (36.7 C) (02/12 0400) Temp Source: Oral (02/12 0400) BP: 158/74 (02/12 0400) Pulse Rate: 75 (02/12 0400)  Labs: Recent Labs    11/03/23 1812 11/04/23 0209 11/04/23 0209 11/04/23 1455 11/05/23 0247 11/06/23 0345  HGB  --  6.9*   < >  --  9.1* 9.2*  HCT  --  20.7*  --   --  27.4* 27.9*  PLT  --  317  --   --  316 309  HEPARINUNFRC 0.42 0.49  --   --   --   --   CREATININE  --   --   --  9.18* 6.10* 7.45*   < > = values in this interval not displayed.    Estimated Creatinine Clearance: 8.6 mL/min (A) (by C-G formula based on SCr of 7.45 mg/dL (H)).  Medications:  PTA apixaban 2.5mg  PO BID (last dose 10/27/2023 AM)  Assessment: 35 yoF presents with abdominal pain with complicated diverticulitis w/ contained perforation and possible need for colectomy. PMH: CKD 5, A fib (apixaban), CAD, and CVA.  Pharmacy consulted to dose IV heparin for Afib. 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   Heparin level was therapeutic (0.49) on infusion at 1800 units/hr this morning. Overnight Hgb dropped down to 6.9 and she received 1 unit of PRBC. No apparent GI bleeding noted, but heparin is being held while she is evaluated for bleeding.    2/12 AM update:  Hgb stable x 2 days  Re-starting heparin  Goal of Therapy:  Heparin level 0.3-0.7 units/ml Monitor platelets by anticoagulation protocol: Yes   Plan:  Re-start heparin at 1400 units/hr Heparin  level in 8 hours Monitor for bleeding, Trend Hgb  Abran [facility], PharmD, BCPS Clinical Pharmacist Phone: [phone]

## 2023-11-06 NOTE — Progress Notes (Signed)
Physical Therapy Treatment Patient Details Name: [name] MRN: [id number] DOB: [birth year] Today's Date: 11/06/2023   History of Present Illness Patient is a 68 y.o.  female - who presented with abdominal pain-found to have acute diverticulitis with contained perforation involving the descending colon. 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  Past medical history of CKD 5, A-fib on Eliquis, CAD    PT Comments  Pt received in supine and agreeable to session. Pt is able to perform mobility tasks with up to min A this session. Pt requires increased time due to pain and quick fatigue. Pt able to progress gait distance this session with 1 seated rest break due to BLE instability and fatigue. Pt demonstrates slow processing and decreased awareness requiring increased cues and assist for obstacle negotiation in room. Pt continues to benefit from PT services to progress toward functional mobility goals.    If plan is discharge home, recommend the following: A little help with walking and/or transfers;A little help with bathing/dressing/bathroom;Assistance with cooking/housework;Assist for transportation;Help with stairs or ramp for entrance   Can travel by private vehicle        Equipment Recommendations  None recommended by PT    Recommendations for Other Services       Precautions / Restrictions Precautions Precautions: Fall Precaution/Restrictions Comments: NGT Restrictions Weight Bearing Restrictions Per Provider Order: No     Mobility  Bed Mobility Overal bed mobility: Needs Assistance Bed Mobility: Supine to Sit     Supine to sit: Min assist     General bed mobility comments: cues for logroll technique and min A for trunk elevation    Transfers Overall transfer level: Needs assistance Equipment used: Rolling walker (2 wheels) Transfers: Sit to/from Stand Sit to Stand: Min assist           General transfer comment: STS from EOB and recliner with  light min A and cues for hand placement    Ambulation/Gait Ambulation/Gait assistance: Min assist Gait Distance (Feet): 10 Feet (+5) Assistive device: Rolling walker (2 wheels) Gait Pattern/deviations: Wide base of support, Drifts right/left, Decreased stride length, Step-through pattern, Trunk flexed Gait velocity: decreased     General Gait Details: Pt requires frequent cues for RW proximity and upright posture. Intermittent min A for RW management for obstacle negotiation and due to pt tending to get R sock stuck under back RW leg. Increased B knee instability with progressed distance. 1 seated rest break due to fatigue       Balance Overall balance assessment: Needs assistance Sitting-balance support: Feet supported, No upper extremity supported Sitting balance-Leahy Scale: Good Sitting balance - Comments: sitting EOB   Standing balance support: During functional activity, Bilateral upper extremity supported, Reliant on assistive device for balance Standing balance-Leahy Scale: Poor Standing balance comment: with RW support                            Communication Communication Communication: No apparent difficulties  Cognition Arousal: Alert Behavior During Therapy: WFL for tasks assessed/performed   PT - Cognitive impairments: No apparent impairments                                Cueing    Exercises      General Comments        Pertinent Vitals/Pain Pain Assessment Pain Assessment: 0-10 Pain Score: 6  Pain Location: abdomen  Pain Descriptors / Indicators: Sore, Discomfort Pain Intervention(s): Limited activity within patient's tolerance, Monitored during session, Repositioned     PT Goals (current goals can now be found in the care plan section) Acute Rehab PT Goals Patient Stated Goal: to go home PT Goal Formulation: With patient Time For Goal Achievement: 11/13/23 Progress towards PT goals: Progressing toward goals     Frequency    Min 1X/week       AM-PAC PT "6 Clicks" Mobility   Outcome Measure  Help needed turning from your back to your side while in a flat bed without using bedrails?: None Help needed moving from lying on your back to sitting on the side of a flat bed without using bedrails?: A Little Help needed moving to and from a bed to a chair (including a wheelchair)?: A Little Help needed standing up from a chair using your arms (e.g., wheelchair or bedside chair)?: A Little Help needed to walk in hospital room?: A Little Help needed climbing 3-5 steps with a railing? : Total 6 Click Score: 17    End of Session Equipment Utilized During Treatment: Gait belt;Oxygen Activity Tolerance: Patient tolerated treatment well;Patient limited by fatigue Patient left: in chair;with chair alarm set;with call bell/phone within reach Nurse Communication: Mobility status PT Visit Diagnosis: Other abnormalities of gait and mobility (R26.89)     Time: 1610-9604 PT Time Calculation (min) (ACUTE ONLY): 25 min  Charges:    $Gait Training: 8-22 mins $Therapeutic Activity: 8-22 mins PT General Charges $$ ACUTE PT VISIT: 1 Visit                     Johny Shock, PTA Acute Rehabilitation Services Secure Chat Preferred  [phone]    Johny Shock 11/06/2023, 11:48 AM

## 2023-11-07 ENCOUNTER — Inpatient Hospital Stay (HOSPITAL_COMMUNITY): Payer: Medicare PPO

## 2023-11-07 DIAGNOSIS — M79661 Pain in right lower leg: Secondary | ICD-10-CM

## 2023-11-07 DIAGNOSIS — N179 Acute kidney failure, unspecified: Secondary | ICD-10-CM | POA: Diagnosis not present

## 2023-11-07 DIAGNOSIS — Z7189 Other specified counseling: Secondary | ICD-10-CM | POA: Diagnosis not present

## 2023-11-07 DIAGNOSIS — Z515 Encounter for palliative care: Secondary | ICD-10-CM | POA: Diagnosis not present

## 2023-11-07 DIAGNOSIS — K5732 Diverticulitis of large intestine without perforation or abscess without bleeding: Secondary | ICD-10-CM | POA: Diagnosis not present

## 2023-11-07 LAB — GLUCOSE, CAPILLARY
Glucose-Capillary: 200 mg/dL — ABNORMAL HIGH (ref 70–99)
Glucose-Capillary: 201 mg/dL — ABNORMAL HIGH (ref 70–99)
Glucose-Capillary: 205 mg/dL — ABNORMAL HIGH (ref 70–99)
Glucose-Capillary: 271 mg/dL — ABNORMAL HIGH (ref 70–99)
Glucose-Capillary: 325 mg/dL — ABNORMAL HIGH (ref 70–99)

## 2023-11-07 LAB — COMPREHENSIVE METABOLIC PANEL
ALT: 14 U/L (ref 0–44)
AST: 16 U/L (ref 15–41)
Albumin: 2.1 g/dL — ABNORMAL LOW (ref 3.5–5.0)
Alkaline Phosphatase: 47 U/L (ref 38–126)
Anion gap: 18 — ABNORMAL HIGH (ref 5–15)
BUN: 45 mg/dL — ABNORMAL HIGH (ref 8–23)
CO2: 18 mmol/L — ABNORMAL LOW (ref 22–32)
Calcium: 7.6 mg/dL — ABNORMAL LOW (ref 8.9–10.3)
Chloride: 97 mmol/L — ABNORMAL LOW (ref 98–111)
Creatinine, Ser: 6.34 mg/dL — ABNORMAL HIGH (ref 0.44–1.00)
GFR, Estimated: 7 mL/min — ABNORMAL LOW (ref >60)
Glucose, Bld: 216 mg/dL — ABNORMAL HIGH (ref 70–99)
Potassium: 4.1 mmol/L (ref 3.5–5.1)
Sodium: 133 mmol/L — ABNORMAL LOW (ref 135–145)
Total Bilirubin: 1.6 mg/dL — ABNORMAL HIGH (ref 0.0–1.2)
Total Protein: 6.3 g/dL — ABNORMAL LOW (ref 6.5–8.1)

## 2023-11-07 LAB — CBC WITH DIFFERENTIAL/PLATELET
Abs Immature Granulocytes: 0 10*3/uL (ref 0.00–0.07)
Basophils Absolute: 0 10*3/uL (ref 0.0–0.1)
Basophils Relative: 0 %
Eosinophils Absolute: 0.3 10*3/uL (ref 0.0–0.5)
Eosinophils Relative: 1 %
HCT: 28.3 % — ABNORMAL LOW (ref 36.0–46.0)
Hemoglobin: 9.3 g/dL — ABNORMAL LOW (ref 12.0–15.0)
Lymphocytes Relative: 3 %
Lymphs Abs: 0.9 10*3/uL (ref 0.7–4.0)
MCH: 26.6 pg (ref 26.0–34.0)
MCHC: 32.9 g/dL (ref 30.0–36.0)
MCV: 80.9 fL (ref 80.0–100.0)
Monocytes Absolute: 1.9 10*3/uL — ABNORMAL HIGH (ref 0.1–1.0)
Monocytes Relative: 6 %
Neutro Abs: 28.2 10*3/uL — ABNORMAL HIGH (ref 1.7–7.7)
Neutrophils Relative %: 90 %
Platelets: 320 10*3/uL (ref 150–400)
RBC: 3.5 MIL/uL — ABNORMAL LOW (ref 3.87–5.11)
RDW: 16 % — ABNORMAL HIGH (ref 11.5–15.5)
WBC: 31.3 10*3/uL — ABNORMAL HIGH (ref 4.0–10.5)
nRBC: 0 /100{WBCs}
nRBC: 0.1 % (ref 0.0–0.2)

## 2023-11-07 LAB — BASIC METABOLIC PANEL
Anion gap: 20 — ABNORMAL HIGH (ref 5–15)
BUN: 38 mg/dL — ABNORMAL HIGH (ref 8–23)
CO2: 20 mmol/L — ABNORMAL LOW (ref 22–32)
Calcium: 7.6 mg/dL — ABNORMAL LOW (ref 8.9–10.3)
Chloride: 96 mmol/L — ABNORMAL LOW (ref 98–111)
Creatinine, Ser: 5.48 mg/dL — ABNORMAL HIGH (ref 0.44–1.00)
GFR, Estimated: 8 mL/min — ABNORMAL LOW (ref >60)
Glucose, Bld: 200 mg/dL — ABNORMAL HIGH (ref 70–99)
Potassium: 3.9 mmol/L (ref 3.5–5.1)
Sodium: 136 mmol/L (ref 135–145)

## 2023-11-07 LAB — MAGNESIUM: Magnesium: 2.1 mg/dL (ref 1.7–2.4)

## 2023-11-07 LAB — PROCALCITONIN: Procalcitonin: 19.62 ng/mL

## 2023-11-07 LAB — HEPARIN LEVEL (UNFRACTIONATED)
Heparin Unfractionated: 0.22 [IU]/mL — ABNORMAL LOW (ref 0.30–0.70)
Heparin Unfractionated: 0.36 [IU]/mL (ref 0.30–0.70)

## 2023-11-07 LAB — C-REACTIVE PROTEIN: CRP: 17.8 mg/dL — ABNORMAL HIGH (ref <1.0)

## 2023-11-07 LAB — PHOSPHORUS: Phosphorus: 5.5 mg/dL — ABNORMAL HIGH (ref 2.5–4.6)

## 2023-11-07 MED ORDER — THIAMINE HCL 100 MG/ML IJ SOLN
100.0000 mg | Freq: Every day | INTRAMUSCULAR | Status: DC
  Administered 2023-11-07: 100 mg via INTRAVENOUS
  Filled 2023-11-07: qty 2

## 2023-11-07 MED ORDER — TRACE MINERALS CU-MN-SE-ZN 300-55-60-3000 MCG/ML IV SOLN
INTRAVENOUS | Status: AC
  Filled 2023-11-07: qty 384

## 2023-11-07 MED ORDER — INSULIN ASPART 100 UNIT/ML IJ SOLN: 0.0000 [IU] | INTRAMUSCULAR | Status: AC

## 2023-11-07 MED ORDER — INSULIN ASPART 100 UNIT/ML IJ SOLN
0.0000 [IU] | INTRAMUSCULAR | Status: DC
  Administered 2023-11-07: 5 [IU] via SUBCUTANEOUS
  Administered 2023-11-08: 9 [IU] via SUBCUTANEOUS
  Administered 2023-11-08: 5 [IU] via SUBCUTANEOUS

## 2023-11-07 MED ORDER — POTASSIUM CHLORIDE 10 MEQ/100ML IV SOLN
10.0000 meq | Freq: Once | INTRAVENOUS | Status: AC
  Administered 2023-11-07: 10 meq via INTRAVENOUS
  Filled 2023-11-07: qty 100

## 2023-11-07 NOTE — Progress Notes (Signed)
Followed up with Dr. Burney Gauze in reference to having foley catheter removed today.  He advised keep it today.  [facility]ative has consulted with patient and she is now a DNR.

## 2023-11-07 NOTE — Consult Note (Signed)
WOC Nurse wound follow up Wound type: surgical  Measurement: see previous note Wound bed: pale but clean Drainage (amount, consistency, odor) moderate, serosanguinous Periwound: intact, ostomy left lateral wound edge  Dressing procedure/placement/frequency: Removed old NPWT dressing (2pc black) Periwound skin between upper wound and distal wound protect with ostomy barrier ring, 1/2 barrier ring used along wound edge at umbilicus and at 3 o'clock between wound edge and stoma. 1/2 barrier ring used along distal aspect of wound edge due to overlapping pannus  Filled wound with 2 piece of black foam  Sealed NPWT dressing at HG Patient received IV pain medication per bedside nurse prior to dressing change Patient tolerated procedure well  WOC nurse will continue to provide NPWT dressing changed due to the complexity of the dressing change.  WOC Nurse ostomy follow up Stoma type/location: LUQ, colostomy  Stomal assessment/size:  1 3 /8 oval shaped, budded, pink, moist Peristomal assessment: intact, dip in abdominal topography at 3 and 9 o'clock  Treatment options for stomal/peristomal skin: 2"  barrier ring Output liquid green Ostomy pouching: 1pc.soft convex with barrier ring. May need belt at some point, I have placed in the room  Education provided: none, patient is not able to participate; PATIENT IS LEGALLY BLIND; WILL NEED SUPPORT AT HOME.  Enrolled patient in [place] Secure Start Discharge program: NO not sure of pouching at this point  WOC Nurse will follow along with you for continued support with ostomy teaching and care Kebrina Friend [facility] MSN, RN, [place], CNS, [place] [phone]

## 2023-11-07 NOTE — Progress Notes (Addendum)
   11/07/23 1508  Mobility  Activity Transferred from chair to bed  Level of Assistance Minimal assist, patient does 75% or more  Assistive Device Other (Comment) (HHA)  Activity Response Tolerated fair  Mobility Referral Yes  Mobility visit 1 Mobility  Mobility Specialist Start Time (ACUTE ONLY) 1500  Mobility Specialist Stop Time (ACUTE ONLY) 1508  Mobility Specialist Time Calculation (min) (ACUTE ONLY) 8 min   Mobility Specialist: Progress Note  Pre-Mobility:      HR 75, SpO2 99% 2.75L Post-Mobility:    HR 86, SpO2 97% 2.75L, BP158/80  Pt agreeable to mobility session - received in chair. C/o pain at wound vac site post transfer.  Returned to bed with all needs met - call bell within reach. Bed alarm on.  NT present throughout visit.  Barnie Mort, BS Mobility Specialist Please contact via SecureChat or  Rehab office at [phone].

## 2023-11-07 NOTE — Progress Notes (Addendum)
PHARMACY - TOTAL PARENTERAL NUTRITION CONSULT NOTE   Indication: Prolonged ileus  Patient Measurements: Height: 5' 7.01" (170.2 cm) Weight: 102.9 kg (226 lb 13.7 oz) IBW/kg (Calculated) : 61.62 TPN AdjBW (KG): 71.3 Body mass index is 35.52 kg/m. Usual Weight: 102.9 kg  Assessment: 52 YOF who is s/p Hartmann's for sigmoid diverticulitis w/ perforation with prolonged ileus requiring TPN therapy. 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 HgbA1c 6.6 on 10/28/2023 Electrolytes: Na 136, K 3.9, Cl 96, Ca 7.6 [CoCa 9.36], Mag 2.1, Phos 7.0 Renal: iHD  Hepatic: no current labs. Ordered STAT Intake / Output; MIVF: NG 1150 mL, Colostomy 150 mL GI Imaging: 2/12 CT abd shows 1. Interval colostomy with fluid collection along the left gutter,dominant pocket measuring 10 x 6 x 5 cm. 2. No bowel obstruction GI Surgeries / Procedures:  2/8-  POD 5, s/p Hartmann's for Sigmoid diverticulitis with perforation   Central access: 11/07/2023 TPN start date: 11/07/2023  Nutritional Goals: Goal TPN rate is 85 mL/hr (provides 122 g of protein and 2400 kcals per day)  RD Assessment: Estimated Needs Total Energy Estimated Needs: 2250-2450 Total Protein Estimated Needs: 120-135 grams Total Fluid Estimated Needs: 1000 ml + UOP  Current Nutrition:  NPO and TPN  Plan:  Start TPN at 58mL/hr at 1800 will provide 58 g protein and 1131 Kcal for today. Start lower and titrate upward as she is high risk for refeeding syndrome Electrolytes in TPN: Na 70mEq/L, K 71mEq/L, Ca 67mEq/L, Mg 54mEq/L, and Phos 51mmol/L. Cl:Ac 2:1 Add standard MVI and trace elements to TPN Add thiamine 100 mg iv to bag x7 days. High risk for refeeding syndrome Potassium 10 meq iv x1 today Initiate Sensitive q4h SSI and adjust as needed at 2000 Monitor TPN labs on Mon/Thurs, Bmet, Phos, Mag 2/14   Greta Doom BS, PharmD, BCPS Clinical  Pharmacist 11/07/2023 10:08 AM  Contact: [phone] after 3 PM  "Be curious, not judgmental..." -Debbora Dus

## 2023-11-07 NOTE — Progress Notes (Signed)
5 Days Post-Op  Subjective: No new complaints today.  Seems much more comfortable this morning; however developed significant nausea and some dry heaving early this morning.  NGT returned to LIWS, but with minimal output.  Objective: Vital signs in last 24 hours: Temp:  [97.7 F (36.5 C)-98.3 F (36.8 C)] 98.1 F (36.7 C) (02/13 0345) Pulse Rate:  [69-79] 79 (02/13 0345) Resp:  [12-20] 20 (02/13 0345) BP: (149-184)/(62-89) 159/64 (02/13 0345) SpO2:  [94 %-100 %] 97 % (02/13 0345) Weight:  [102.9 kg] 102.9 kg (02/12 1722) Last BM Date : 11/06/23  Intake/Output from previous day: 02/12 0701 - 02/13 0700 In: 225.8 [I.V.:75.8; IV Piggyback:150] Out: 1300 [Emesis/NG output:1150; Stool:150] Intake/Output this shift: No intake/output data recorded.  PE: Gen:  NAD Abd: soft, appropriately tender as expected overall and less so today.  Colostomy with some liquid stool present, 150cc/24 hrs.  Stoma is viable.  NGT with about 1150cc of bilious output/24hrs.  Midline wound is cleaner than Tuesday, but slightly paler.  Overall looks well though.  Ext: TED hose on LEs.  No definitive pain to palpation.  Some edema, but this appears chronic GU: foley in place with clear yellow urine  Lab Results:  Recent Labs    11/06/23 0345 11/07/23 0319  WBC 26.8* 31.3*  HGB 9.2* 9.3*  HCT 27.9* 28.3*  PLT 309 320   BMET Recent Labs    11/06/23 0345 11/07/23 0319  NA 134* 136  K 3.8 3.9  CL 93* 96*  CO2 21* 20*  GLUCOSE 254* 200*  BUN 62* 38*  CREATININE 7.45* 5.48*  CALCIUM 7.5* 7.6*   PT/INR No results for input(s): "LABPROT", "INR" in the last 72 hours. CMP     Component Value Date/Time   NA 136 11/07/2023 0319   NA 137 02/28/2021 1548   K 3.9 11/07/2023 0319   CL 96 (L) 11/07/2023 0319   CO2 20 (L) 11/07/2023 0319   GLUCOSE 200 (H) 11/07/2023 0319   BUN 38 (H) 11/07/2023 0319   BUN 33 (H) 02/28/2021 1548   CREATININE 5.48 (H) 11/07/2023 0319   CREATININE 3.38 (H)  12/26/2015 1347   CALCIUM 7.6 (L) 11/07/2023 0319   PROT 6.0 (L) 11/02/2023 0133   PROT 7.0 11/07/2017 1414   ALBUMIN 1.8 (L) 11/04/2023 1455   ALBUMIN 4.0 11/07/2017 1414   AST 8 (L) 11/02/2023 0133   ALT 9 11/02/2023 0133   ALKPHOS 29 (L) 11/02/2023 0133   BILITOT 1.2 11/02/2023 0133   BILITOT 0.2 11/07/2017 1414   GFRNONAA 8 (L) 11/07/2023 0319   GFRNONAA 13 (L) 01/10/2015 1428   GFRAA 21 (L) 12/21/2019 1802   GFRAA 16 (L) 01/10/2015 1428   Lipase     Component Value Date/Time   LIPASE 26 10/27/2023 1936    Studies/Results: CT ABDOMEN PELVIS WO CONTRAST Result Date: 11/06/2023 CLINICAL DATA:  Diverticulitis, complication suspected EXAM: CT ABDOMEN AND PELVIS WITHOUT CONTRAST TECHNIQUE: Multidetector CT imaging of the abdomen and pelvis was performed following the standard protocol without IV contrast. RADIATION DOSE REDUCTION: This exam was performed according to the departmental dose-optimization program which includes automated exposure control, adjustment of the mA and/or kV according to patient size and/or use of iterative reconstruction technique. COMPARISON:  10/31/2023 FINDINGS: Lower chest: Volume loss and opacity in the lower lungs consistent with atelectasis. There is a central line with tip at the upper right atrium. Coronary atherosclerosis. Hepatobiliary: No focal liver abnormality. Calcific in the intermediate density in the gallbladder  attributed to stones. No biliary dilatation or pericholecystic edema. Pancreas: Unremarkable. Spleen: Unremarkable. Adrenals/Urinary Tract: Negative adrenals. No hydronephrosis or stone. Renal cortical thinning and lobulation at the left upper pole. 3 mm stone at the upper pole left kidney. The urinary bladder is collapsed around a Foley catheter. Stomach/Bowel: Descending colostomy. Bowel anastomosis in the right abdomen. No evidence of bowel obstruction. Fluid collection with loculation appearance/peritoneal thickening along the left flank  measuring up to 5 x 6 x 10 cm. Milder fluid accumulation in the ventral peritoneal space. Vascular/Lymphatic: Extensive atheromatous calcification of the aorta and iliacs. No mass or adenopathy. Reproductive:No pathologic findings. Other: No ascites or pneumoperitoneum. Musculoskeletal: No acute abnormalities.  T9 butterfly vertebra IMPRESSION: 1. Interval colostomy with fluid collection along the left gutter, dominant pocket measuring 10 x 6 x 5 cm. 2. No bowel obstruction. 3. Cholelithiasis and left nephrolithiasis. Electronically Signed   By: Tiburcio Pea M.D.   On: 11/06/2023 07:12     Anti-infectives: Anti-infectives (From admission, onward)    Start     Dose/Rate Route Frequency Ordered Stop   11/06/23 0815  piperacillin-tazobactam (ZOSYN) IVPB 2.25 g        2.25 g 100 mL/hr over 30 Minutes Intravenous Every 8 hours 11/06/23 0729     11/01/23 1415  piperacillin-tazobactam (ZOSYN) IVPB 2.25 g        2.25 g 100 mL/hr over 30 Minutes Intravenous Every 8 hours 11/01/23 1316 11/06/23 0602   10/29/23 1100  piperacillin-tazobactam (ZOSYN) IVPB 2.25 g  Status:  Discontinued        2.25 g 100 mL/hr over 30 Minutes Intravenous Every 8 hours 10/29/23 1006 11/01/23 1016   10/28/23 0600  piperacillin-tazobactam (ZOSYN) IVPB 2.25 g  Status:  Discontinued        2.25 g 100 mL/hr over 30 Minutes Intravenous Every 8 hours 10/27/23 2224 10/27/23 2235   10/28/23 0600  ceFEPIme (MAXIPIME) 1 g in sodium chloride 0.9 % 100 mL IVPB  Status:  Discontinued        1 g 200 mL/hr over 30 Minutes Intravenous Every 24 hours 10/27/23 2235 10/29/23 0954   10/28/23 0600  metroNIDAZOLE (FLAGYL) IVPB 500 mg  Status:  Discontinued        500 mg 100 mL/hr over 60 Minutes Intravenous Every 12 hours 10/27/23 2235 10/29/23 0954   10/27/23 2130  piperacillin-tazobactam (ZOSYN) IVPB 3.375 g        3.375 g 100 mL/hr over 30 Minutes Intravenous  Once 10/27/23 2128 10/27/23 2320        Assessment/Plan  POD 5, s/p  Hartmann's by Dr. Freida Busman 11/02/23 forSigmoid diverticulitis with perforation  -cont abx therapy given rising WBC (31K), but unclear etiology currently.  Will order duplex of LEs, DC foley today.  Monitor fluid collection in abdomen, but unclear if this is etiology or not.  Remains AF -cont NGT to LIWS today given output and nausea. -hgb 9.3 -therapies for mobilization. -VAC to midline wound, will change M/Th -WOC for colostomy care -DC foley today   FEN: NPO/NGT/start TNA after new line placement VTE: heparin gtt - hgb stable ID: cefepime/flagyl 2/3>2/4; Zosyn 2/4>>   - per TRH -  PAF  HTN HLD AKI on CKD stage V - on HD CAD T2DM    LOS: 11 days    Letha Cape , [facility] Surgery 11/07/2023, 9:47 AM Please see Amion for pager number during day hours 7:00am-4:30pm

## 2023-11-07 NOTE — Progress Notes (Signed)
PROGRESS NOTE        PATIENT DETAILS Name: [name] Age: 68 y.o. Sex: female Date of Birth: 10-31-1955 Admit Date: 10/27/2023 Admitting Physician Dolly Rias, MD ZOX:[id number], Devonne Doughty, NP  Brief Summary: Patient is a 69 y.o.  female with history of CKD 5, A-fib on Eliquis, CAD-who presented with abdominal pain-found to have acute diverticulitis with contained perforation involving the descending colon.  Hospital course complicated by worsening AKI-thought to have progressed to ESRD-requiring initiation of HD.  See below for further details.  Significant events: 2/2>> admit to [facility] 2/6>> CT abdomen worsening diverticulitis with perforation 2/7>> transitioned to full comfort measures-as with worsening AKI-however multiple family members arrived-comfort care revoked-now full code-full scope of treatment including HD/laparotomy with colostomy.  General surgery/nephrology reconsulted.  IR placed [facility] and patient underwent first hemodialysis.  2/8>> remains n.p.o-colectomy/colostomy later today.  Significant studies: 2/2>> CT abdomen/pelvis: Acute diverticulitis-descending colon with contained perforation 2/6>> CT abdomen/pelvis: Worsening diverticulitis of descending colon with perforation and increasing failure-suspected oral contrast extravasation left abdomen.  Significant microbiology data: 2/2>> COVID/influenza/RSV PCR: Negative  Procedures: 2/8>> by Dr. Freida Busman underwent exploratory laparotomy with Hartman's resection for perforated diverticulitis with diffuse peritonitis, colostomy in place.  Consults: Surgery Renal Palliative IR  Subjective:  Patient in bed, appears comfortable, denies any headache, no fever, no chest pain or pressure, no shortness of breath , minimal abdominal pain. No new focal weakness.   Objective: Vitals: Blood pressure (!) 159/64, pulse 79, temperature 98.1 F (36.7 C), temperature source Oral, resp. rate 20, height 5'  7.01" (1.702 m), weight 102.9 kg, SpO2 97%.   Exam:  Awake Alert, No new F.N deficits, NG-tube in place to intermittent suction, dark bilious secretion, colostomy bag in place, right IJ HD catheter,   [name],[name], [name],   Symmetrical Chest wall movement, Good air movement bilaterally, CTAB RRR,No Gallops, Rubs or new Murmurs,  +ve B.Sounds, Abd Soft, No tenderness,   No Cyanosis, Clubbing or edema    Assessment/Plan:  Acute diverticulitis with contained perforation of the descending colon - Initially was managed conservatively, subsequently 2/8>> by Dr. Freida Busman general surgery she underwent exploratory laparotomy with Hartman's resection for perforated diverticulitis with diffuse peritonitis, colostomy in place, continue Zosyn, continue NG tube, defer further management of this issue to General surgery Case discussed with general surgery team on 11/05/2023, monitor closely on present treatment, does have rising leukocytosis but afebrile, repeat CT noted, case discussed with general surgery for now wait and watch and monitor.  Still requiring NG tube to intermittent suction on 11/07/2023, will start on IV TNA and obtain an appropriate access.   AKI on CKD 5 with progression to ESRD AKI likely hemodynamically mediated-has now likely progressed to ESRD.  HD initiated via right IJ temporary catheter.  Likely getting tunneled catheter on 11/07/2023 along with IJ PICC line for TNA.  CAD Currently no anginal symptoms Suspect not on antiplatelets as on anticoagulation with Eliquis/heparin Continue beta-blocker On Repatha injections as an outpatient.  PAF Sinus rhythm Continue Coreg Will resume IV heparin with caution on 11/06/2023.  Continue to monitor H&H.  Anemia of acute illness  Brought on by acute illness, underlying renal insufficiency now developing to ESRD, surgical blood loss, gradually trending down, 2 units of packed RBC on 11/04/2023, on IV PPI. CBC looks stable on  11/05/2023 will continue to monitor closely.  HTN BP stable-continue Coreg/amlodipine.  DM-2 (A1c 6.6 on 2/3) CBGs relatively stable on SSI-follow/optimize as diet get started back on.  Recent Labs    11/06/23 1748 11/06/23 2157 11/07/23 0905  GLUCAP 157* 161* 201*    Palliative care/goals of care Initially DNR-and wanted to do comfort care, now is full code wants to undergo aggressive treatment including surgery and dialysis.  Full code now.  Class 1 Obesity: Estimated body mass index is 35.52 kg/m as calculated from the following:   Height as of this encounter: 5' 7.01" (1.702 m).   Weight as of this encounter: 102.9 kg.    Code status:   Code Status: Full Code   DVT Prophylaxis: Place and maintain sequential compression device Start: 11/06/23 0613 IV Heparin  Family Communication:  [name] [phone] updated 2/7  Disposition Plan: Status is: Inpatient Remains inpatient appropriate because: Severity of illness   Planned Discharge Destination:Residential hospice   Diet: Diet Order             Diet NPO time specified Except for: Sips with Meds  Diet effective now                   MEDICATIONS: Scheduled Meds:  sodium chloride   Intravenous Once   sodium chloride   Intravenous Once   Chlorhexidine Gluconate Cloth  6 each Topical Q0600   insulin aspart  0-6 Units Subcutaneous TID WC   lidocaine-EPINEPHrine (PF)  10 mL Infiltration Once   mouth rinse  15 mL Mouth Rinse 4 times per day   pantoprazole (PROTONIX) IV  40 mg Intravenous Q12H   sodium chloride flush  10-40 mL Intracatheter Q12H   Continuous Infusions:  heparin 1,700 Units/hr (11/07/23 0510)   iron sucrose Stopped (11/06/23 1228)   piperacillin-tazobactam (ZOSYN)  IV 2.25 g (11/06/23 2332)   PRN Meds:.acetaminophen **OR** acetaminophen, albuterol, hydrALAZINE, HYDROmorphone (DILAUDID) injection, menthol-cetylpyridinium, ondansetron (ZOFRAN) IV, mouth rinse   I have personally reviewed  following labs and imaging studies  LABORATORY DATA:   Data Review:    Recent Labs  Lab 11/03/23 0310 11/04/23 0209 11/05/23 0247 11/06/23 0345 11/07/23 0319  WBC 14.2* 19.0* 24.7* 26.8* 31.3*  HGB 7.8* 6.9* 9.1* 9.2* 9.3*  HCT 23.5* 20.7* 27.4* 27.9* 28.3*  PLT 313 317 316 309 320  MCV 78.6* 77.8* 79.9* 79.7* 80.9  MCH 26.1 25.9* 26.5 26.3 26.6  MCHC 33.2 33.3 33.2 33.0 32.9  RDW 14.7 14.6 15.4 15.8* 16.0*  LYMPHSABS 0.7 0.9 1.1 0.5* 0.9  MONOABS 1.6* 1.9* 2.1* 2.4* 1.9*  EOSABS 0.3 0.1 0.1 0.3 0.3  BASOSABS 0.1 0.1 0.1 0.0 0.0    Recent Labs  Lab 11/01/23 0532 11/02/23 0133 11/03/23 0310 11/04/23 0209 11/04/23 1455 11/05/23 0247 11/06/23 0345 11/07/23 0319  NA 129* 132* 133*  --  136 135 134* 136  K 4.6 4.2 4.0  --  3.9 3.9 3.8 3.9  CL 97* 97* 95*  --  96* 94* 93* 96*  CO2 14* 17* 19*  --  20* 21* 21* 20*  ANIONGAP 18* 18* 19*  --  22* 20* 20* 20*  GLUCOSE 236* 212* 193*  --  224* 194* 254* 200*  BUN 106* 94* 65*  --  81* 47* 62* 38*  CREATININE 10.48* 9.20* 7.34*  --  9.18* 6.10* 7.45* 5.48*  AST  --  8*  --   --   --   --   --   --   ALT  --  9  --   --   --   --   --   --  ALKPHOS  --  29*  --   --   --   --   --   --   BILITOT  --  1.2  --   --   --   --   --   --   ALBUMIN 2.2* 2.0* 2.1*  --  1.8*  --   --   --   CRP  --   --  22.7* 24.8*  --  23.5* 19.3* 17.8*  PROCALCITON  --   --  41.53 42.39  --  30.19 25.65 19.62  TSH  --   --  1.046  --   --   --   --   --   BNP  --   --  78.5 74.4  --  179.0* 244.4*  --   MG  --   --  2.1 2.3  --  2.1 2.3 2.1  PHOS 7.6*  --  6.5*  --  7.0*  --   --   --   CALCIUM 7.6* 7.9* 7.5*  --  7.4* 7.7* 7.5* 7.6*      Recent Labs  Lab 11/03/23 0310 11/04/23 0209 11/04/23 1455 11/05/23 0247 11/06/23 0345 11/07/23 0319  CRP 22.7* 24.8*  --  23.5* 19.3* 17.8*  PROCALCITON 41.53 42.39  --  30.19 25.65 19.62  TSH 1.046  --   --   --   --   --   BNP 78.5 74.4  --  179.0* 244.4*  --   MG 2.1 2.3  --  2.1 2.3 2.1   CALCIUM 7.5*  --  7.4* 7.7* 7.5* 7.6*    --------------------------------------------------------------------------------------------------------------- Lab Results  Component Value Date   CHOL 134 12/21/2019   HDL 49 12/21/2019   LDLCALC 69 12/21/2019   TRIG 82 12/21/2019   CHOLHDL 2.7 12/21/2019    Lab Results  Component Value Date   HGBA1C 6.6 (H) 10/28/2023   No results for input(s): "TSH", "T4TOTAL", "FREET4", "T3FREE", "THYROIDAB" in the last 72 hours.  No results for input(s): "VITAMINB12", "FOLATE", "FERRITIN", "TIBC", "IRON", "RETICCTPCT" in the last 72 hours. ------------------------------------------------------------------------------------------------------------------ Cardiac Enzymes No results for input(s): "CKMB", "TROPONINI", "MYOGLOBIN" in the last 168 hours.  Invalid input(s): "CK"  Micro Results No results found for this or any previous visit (from the past 240 hours).   Radiology Reports CT ABDOMEN PELVIS WO CONTRAST Result Date: 11/06/2023 CLINICAL DATA:  Diverticulitis, complication suspected EXAM: CT ABDOMEN AND PELVIS WITHOUT CONTRAST TECHNIQUE: Multidetector CT imaging of the abdomen and pelvis was performed following the standard protocol without IV contrast. RADIATION DOSE REDUCTION: This exam was performed according to the departmental dose-optimization program which includes automated exposure control, adjustment of the mA and/or kV according to patient size and/or use of iterative reconstruction technique. COMPARISON:  10/31/2023 FINDINGS: Lower chest: Volume loss and opacity in the lower lungs consistent with atelectasis. There is a central line with tip at the upper right atrium. Coronary atherosclerosis. Hepatobiliary: No focal liver abnormality. Calcific in the intermediate density in the gallbladder attributed to stones. No biliary dilatation or pericholecystic edema. Pancreas: Unremarkable. Spleen: Unremarkable. Adrenals/Urinary Tract: Negative  adrenals. No hydronephrosis or stone. Renal cortical thinning and lobulation at the left upper pole. 3 mm stone at the upper pole left kidney. The urinary bladder is collapsed around a Foley catheter. Stomach/Bowel: Descending colostomy. Bowel anastomosis in the right abdomen. No evidence of bowel obstruction. Fluid collection with loculation appearance/peritoneal thickening along the left flank measuring up to 5 x 6 x 10  cm. Milder fluid accumulation in the ventral peritoneal space. Vascular/Lymphatic: Extensive atheromatous calcification of the aorta and iliacs. No mass or adenopathy. Reproductive:No pathologic findings. Other: No ascites or pneumoperitoneum. Musculoskeletal: No acute abnormalities.  T9 butterfly vertebra IMPRESSION: 1. Interval colostomy with fluid collection along the left gutter, dominant pocket measuring 10 x 6 x 5 cm. 2. No bowel obstruction. 3. Cholelithiasis and left nephrolithiasis. Electronically Signed   By: Tiburcio Pea M.D.   On: 11/06/2023 07:12   DG Chest Port 1 View Result Date: 11/03/2023 CLINICAL DATA:  Shortness of breath EXAM: PORTABLE CHEST - 1 VIEW COMPARISON:  11/11/2018 FINDINGS: Unchanged mild cardiomegaly and pulmonary vascular congestion. Interval worsening of left basilar opacity likely due to atelectasis. Lungs otherwise clear. Examination is limited due to expiratory phase of imaging. Interval placement of temporary right IJ hemodialysis catheter which terminates in the region of the right atrium. Nasogastric tube terminates in the left upper quadrant. IMPRESSION: Interval worsening of left basilar opacity which is likely due to atelectasis. Electronically Signed   By: Acquanetta Belling M.D.   On: 11/03/2023 07:22   IR Fluoro Guide CV Line Right Result Date: 11/01/2023 INDICATION: ESRD requiring HD. EXAM: NON-TUNNELED CENTRAL VENOUS HEMODIALYSIS CATHETER PLACEMENT WITH ULTRASOUND AND FLUOROSCOPIC GUIDANCE COMPARISON:  CT AP 10/31/2023. MEDICATIONS: Local anesthetic  was administered. FLUOROSCOPY TIME:  Fluoroscopic dose; 35 mGy COMPLICATIONS: None immediate. PROCEDURE: Informed written consent was obtained from the patient and/or patient's representative after a discussion of the risks, benefits, and alternatives to treatment. Questions regarding the procedure were encouraged and answered. The RIGHT neck and chest were prepped with chlorhexidine in a sterile fashion, and a sterile drape was applied covering the operative field. Maximum barrier sterile technique with sterile gowns and gloves were used for the procedure. A timeout was performed prior to the initiation of the procedure. After the overlying soft tissues were anesthetized, a small venotomy incision was created and a micropuncture kit was utilized to access the external jugular vein. Real-time ultrasound guidance was utilized for vascular access including the acquisition of a permanent ultrasound image documenting patency of the accessed vessel. The microwire was utilized to measure appropriate catheter length. A stiff glidewire was advanced to the level of the IVC. Under fluoroscopic guidance, the venotomy was serially dilated, ultimately allowing placement of a 20 cm temporary Trialysis catheter with tip ultimately terminating within the superior aspect of the right atrium. Final catheter positioning was confirmed and documented with a spot radiographic image. The catheter aspirates and flushes normally. The catheter was flushed with appropriate volume heparin dwells. The catheter exit site was secured with a 2-0 Ethilon retention suture. A dressing was placed. The patient tolerated the procedure well without immediate post procedural complication. IMPRESSION: Successful placement of a RIGHT EXTERNAL jugular approach 20 cm non-tunneled dialysis catheter The tip of the catheter is positioned within the proximal RIGHT atrium. The catheter is ready for immediate use. PLAN: This catheter may be converted to a tunneled  dialysis catheter at a later date as indicated. Roanna Banning, MD [facility] Radiology Electronically Signed   By: Roanna Banning M.D.   On: 11/01/2023 18:15   IR US Guide Vasc Access Right Result Date: 11/01/2023 INDICATION: ESRD requiring HD. EXAM: NON-TUNNELED CENTRAL VENOUS HEMODIALYSIS CATHETER PLACEMENT WITH ULTRASOUND AND FLUOROSCOPIC GUIDANCE COMPARISON:  CT AP 10/31/2023. MEDICATIONS: Local anesthetic was administered. FLUOROSCOPY TIME:  Fluoroscopic dose; 35 mGy COMPLICATIONS: None immediate. PROCEDURE: Informed written consent was obtained from the patient and/or patient's representative  after a discussion of the risks, benefits, and alternatives to treatment. Questions regarding the procedure were encouraged and answered. The RIGHT neck and chest were prepped with chlorhexidine in a sterile fashion, and a sterile drape was applied covering the operative field. Maximum barrier sterile technique with sterile gowns and gloves were used for the procedure. A timeout was performed prior to the initiation of the procedure. After the overlying soft tissues were anesthetized, a small venotomy incision was created and a micropuncture kit was utilized to access the external jugular vein. Real-time ultrasound guidance was utilized for vascular access including the acquisition of a permanent ultrasound image documenting patency of the accessed vessel. The microwire was utilized to measure appropriate catheter length. A stiff glidewire was advanced to the level of the IVC. Under fluoroscopic guidance, the venotomy was serially dilated, ultimately allowing placement of a 20 cm temporary Trialysis catheter with tip ultimately terminating within the superior aspect of the right atrium. Final catheter positioning was confirmed and documented with a spot radiographic image. The catheter aspirates and flushes normally. The catheter was flushed with appropriate volume heparin  dwells. The catheter exit site was secured with a 2-0 Ethilon retention suture. A dressing was placed. The patient tolerated the procedure well without immediate post procedural complication. IMPRESSION: Successful placement of a RIGHT EXTERNAL jugular approach 20 cm non-tunneled dialysis catheter The tip of the catheter is positioned within the proximal RIGHT atrium. The catheter is ready for immediate use. PLAN: This catheter may be converted to a tunneled dialysis catheter at a later date as indicated. Roanna Banning, MD [facility] Radiology Electronically Signed   By: Roanna Banning M.D.   On: 11/01/2023 18:15   CT ABDOMEN PELVIS WO CONTRAST Result Date: 10/31/2023 CLINICAL DATA:  Diverticulitis, complication suspected. EXAM: CT ABDOMEN AND PELVIS WITHOUT CONTRAST TECHNIQUE: Multidetector CT imaging of the abdomen and pelvis was performed following the standard protocol without IV contrast. RADIATION DOSE REDUCTION: This exam was performed according to the departmental dose-optimization program which includes automated exposure control, adjustment of the mA and/or kV according to patient size and/or use of iterative reconstruction technique. COMPARISON:  10/27/2023. FINDINGS: Lower chest: Heart is enlarged and multi-vessel coronary artery calcifications are noted. Consolidation and patchy airspace disease is present at the lung bases bilaterally. Hepatobiliary: No focal liver abnormality is seen. Stones are present within the gallbladder. No biliary ductal dilatation. Pancreas: Unremarkable. No pancreatic ductal dilatation or surrounding inflammatory changes. Spleen: Normal in size without focal abnormality. Adrenals/Urinary Tract: The adrenal glands are within normal limits. Calcifications are present in the upper pole of the left kidney. There is a cyst in the lower pole of the right kidney. No hydronephrosis bilaterally. The bladder is unremarkable.  Stomach/Bowel: The stomach is within normal limits. No bowel obstruction or pneumatosis is seen. Right hemicolectomy changes are noted. There is diffuse colonic wall thickening. Scattered diverticula are present along the colon. A few small foci of free air are noted in the mesentery in the mid left abdomen, slightly increased from the previous exam. There is suggestion of contrast extravasation in the region. There is fine no definite abscess is seen. Vascular/Lymphatic: Aortic atherosclerosis. No enlarged abdominal or pelvic lymph nodes. Reproductive: No acute abnormality. Other: Mild ascites is noted in all 4 quadrants. A fat containing umbilical hernia is noted. There is mild anasarca. Musculoskeletal: The bony structures are stable. IMPRESSION: 1. Worsening diverticulitis of the descending colon with perforation and increasing free air and suspected oral contrast  extravasation in the mid left abdomen. No definite abscess is seen. Surgical consultation is recommended. 2. Mild ascites. 3. Diffuse colonic wall thickening which may be due to associated inflammatory changes. 4. Patchy airspace disease at the lung bases with bilateral lower lobe consolidation. 5. Cholelithiasis. 6. Aortic atherosclerosis. Critical Value/emergent results were called by telephone at the time of interpretation on 10/31/2023 at 11:59 pm to provider Dr. Antionette Char, who verbally acknowledged these results. Electronically Signed   By: Thornell Sartorius M.D.   On: 10/31/2023 23:59   CT ABDOMEN PELVIS WO CONTRAST Result Date: 10/27/2023 CLINICAL DATA:  Acute nonlocalized abdominal pain. Chills and fever. Vomiting and diarrhea. EXAM: CT ABDOMEN AND PELVIS WITHOUT CONTRAST TECHNIQUE: Multidetector CT imaging of the abdomen and pelvis was performed following the standard protocol without IV contrast. RADIATION DOSE REDUCTION: This exam was performed according to the departmental dose-optimization program which includes automated exposure control,  adjustment of the mA and/or kV according to patient size and/or use of iterative reconstruction technique. COMPARISON:  CT abdomen and pelvis 04/19/2021 FINDINGS: Lower chest: Bibasilar atelectasis.  No acute abnormality. Hepatobiliary: Cholelithiasis without evidence of acute cholecystitis. No biliary dilation. Unremarkable noncontrast appearance of the liver. Pancreas: Unremarkable. Spleen: Unremarkable. Adrenals/Urinary Tract: Normal adrenal glands. No urinary calculi or hydronephrosis. Unremarkable bladder. Stomach/Bowel: Stomach is within normal limits. No bowel obstruction. Colonic diverticulosis. Wall thickening, inflammatory stranding, and free fluid about the descending colon. Loosely organized fluid and gas about the inflamed descending colon (circa series 3/image [id number]) compatible with contained perforation. The adjacent jejunum is inflamed. Postoperative change about the colon with ileocolonic anastomosis in the right upper quadrant. Vascular/Lymphatic: Aortic atherosclerosis. No enlarged abdominal or pelvic lymph nodes. Reproductive: No acute abnormality. Other: Fat containing periumbilical hernia. Musculoskeletal: No acute fracture. IMPRESSION: 1. Acute diverticulitis of the descending colon with contained perforation. 2. Inflamed jejunum adjacent to the perforated diverticulitis. 3.  Aortic Atherosclerosis (ICD10-I70.0). Critical Value/emergent results were called by telephone at the time of interpretation on 10/27/2023 at 9:22 pm to provider [facility] , who verbally acknowledged these results. Electronically Signed   By: Minerva Fester M.D.   On: 10/27/2023 21:24    Signature  -   Susa Raring M.D on 11/07/2023 at 9:45 AM   -  To page go to [email or web address]

## 2023-11-07 NOTE — TOC Progression Note (Signed)
Transition of Care [facility]) - Progression Note    Patient Details  Name: [name] MRN: [id number] Date of Birth: October 08, 1955  [facility] CM/SW Contact  Mearl Latin, LCSW Phone Number: 11/07/2023, 8:38 AM  Clinical Narrative:    TOC continuing to follow for needs and medical progression.    Expected Discharge Plan: IP Rehab Facility Barriers to Discharge: Continued Medical Work up, Conservator, museum/gallery and Services In-house Referral: Clinical Social Work   Post Acute Care Choice: IP Rehab Living arrangements for the past 2 months: Single Family Home                                       Social Determinants of Health (SDOH) Interventions SDOH Screenings   Food Insecurity: No Food Insecurity (10/28/2023)  Housing: Low Risk  (10/28/2023)  Transportation Needs: No Transportation Needs (10/28/2023)  Utilities: Not At Risk (10/28/2023)  Depression (PHQ2-9): Low Risk  (02/28/2021)  Financial Resource Strain: Low Risk  (08/25/2021)   Received from [facility], [facility]  Physical Activity: Insufficiently Active (06/20/2021)   Received from [facility], [facility]  Social Connections: Socially Isolated (10/28/2023)  Stress: No Stress Concern Present (08/25/2021)   Received from [facility], [facility]  Tobacco Use: Low Risk  (11/02/2023)    Readmission Risk Interventions     No data to display

## 2023-11-07 NOTE — Progress Notes (Signed)
Patient ID: [name], female   DOB: Apr 22, 1956, 68 y.o.   MRN: [id number] [facility] Progress Note   Assessment/ Plan:   1.  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  After initial refusal of wanting to pursue renal replacement therapy, she changed her mind and asked to start dialysis with the intention of undergoing surgery for contained perforation of acute diverticulitis.   - #2 HD t2/12.HD #3 Friday - has non tunneled HD cath in for 4 days-  will need to be converted to [facility] next week if she still wants to continue - I had followed her as OP-  she had been heavily entertaining not doing dialysis.  I told her that she is in charge and she can stop at any time  "it doesn't feel like it "  2.  Acute diverticulitis with contained perforation of descending colon: On Zosyn and now status post exploratory laparotomy with Hartman's resection for perforated diverticulitis/peritonitis yesterday (2/8).  Surgical note reviewed from today along with wound assessment. - going to get TNA 3.  Anion gap metabolic acidosis: Likely secondary to combination of acute kidney injury and ongoing infection, improved with dialysis. 4.  Anemia: Likely secondary to chronic illness, iron stores significantly low and yesterday started on intravenous iron for loading prior to starting ESA.  She is also status post PRBC transfusion  Subjective:    HD yesterday, going to get TNA.     Objective:   BP (!) 159/64 (BP Location: Right Arm)   Pulse 79   Temp 98.1 F (36.7 C) (Oral)   Resp 20   Ht 5' 7.01" (1.702 m)   Wt 102.9 kg   SpO2 97%   BMI 35.52 kg/m   Intake/Output Summary (Last 24 hours) at 11/07/2023 1146 Last data filed at 11/07/2023 0510 Gross per 24 hour  Intake 225.77 ml  Output 1300 ml  Net -1074.23 ml   Weight change:   Physical Exam: Gen:  Uncomfortably resting in bed, NGT in place with LIS and bilious drainage CVS: Pulse regular rhythm, normal rate, S1 and S2 normal Resp: Clear to auscultation bilaterally with diminished breath sounds over bases, no rales Abd: Protuberant with colostomy bag in situ.  (Wound assessment noted on surgical note) Ext: Trace ankle edema.  No asterixis/myoclonic jerks noted  Imaging: CT ABDOMEN PELVIS WO CONTRAST Result Date: 11/06/2023 CLINICAL DATA:  Diverticulitis, complication suspected EXAM: CT ABDOMEN AND PELVIS WITHOUT CONTRAST TECHNIQUE: Multidetector CT imaging of the abdomen and pelvis was performed following the standard protocol without IV contrast. RADIATION DOSE REDUCTION: This exam was performed according to the departmental dose-optimization program which includes automated exposure control, adjustment of the mA and/or kV according to patient size and/or use of iterative reconstruction technique. COMPARISON:  10/31/2023 FINDINGS: Lower chest: Volume loss and opacity in the lower lungs consistent with atelectasis. There is a central line with tip at the upper right atrium. Coronary atherosclerosis. Hepatobiliary: No focal liver abnormality. Calcific in the intermediate density in the gallbladder attributed to stones. No biliary dilatation or pericholecystic edema. Pancreas: Unremarkable. Spleen: Unremarkable. Adrenals/Urinary Tract: Negative adrenals. No hydronephrosis or stone. Renal cortical thinning and lobulation at the left upper pole. 3 mm stone at the upper pole left kidney. The urinary bladder is collapsed around a Foley catheter. Stomach/Bowel: Descending colostomy. Bowel anastomosis in the right abdomen. No evidence of bowel obstruction. Fluid collection with loculation appearance/peritoneal  thickening along the left flank measuring up to 5 x 6 x 10 cm. Milder fluid accumulation in the ventral peritoneal space. Vascular/Lymphatic: Extensive atheromatous calcification of the aorta and iliacs.  No mass or adenopathy. Reproductive:No pathologic findings. Other: No ascites or pneumoperitoneum. Musculoskeletal: No acute abnormalities.  T9 butterfly vertebra IMPRESSION: 1. Interval colostomy with fluid collection along the left gutter, dominant pocket measuring 10 x 6 x 5 cm. 2. No bowel obstruction. 3. Cholelithiasis and left nephrolithiasis. Electronically Signed   By: Tiburcio Pea M.D.   On: 11/06/2023 07:12     Labs: BMET Recent Labs  Lab 11/01/23 0532 11/02/23 0133 11/03/23 0310 11/04/23 1455 11/05/23 0247 11/06/23 0345 11/07/23 0319  NA 129* 132* 133* 136 135 134* 136  K 4.6 4.2 4.0 3.9 3.9 3.8 3.9  CL 97* 97* 95* 96* 94* 93* 96*  CO2 14* 17* 19* 20* 21* 21* 20*  GLUCOSE 236* 212* 193* 224* 194* 254* 200*  BUN 106* 94* 65* 81* 47* 62* 38*  CREATININE 10.48* 9.20* 7.34* 9.18* 6.10* 7.45* 5.48*  CALCIUM 7.6* 7.9* 7.5* 7.4* 7.7* 7.5* 7.6*  PHOS 7.6*  --  6.5* 7.0*  --   --   --    CBC Recent Labs  Lab 11/04/23 0209 11/05/23 0247 11/06/23 0345 11/07/23 0319  WBC 19.0* 24.7* 26.8* 31.3*  NEUTROABS 14.4* 19.5* 23.6* 28.2*  HGB 6.9* 9.1* 9.2* 9.3*  HCT 20.7* 27.4* 27.9* 28.3*  MCV 77.8* 79.9* 79.7* 80.9  PLT 317 316 309 320    Medications:     sodium chloride   Intravenous Once   sodium chloride   Intravenous Once   Chlorhexidine Gluconate Cloth  6 each Topical Q0600   insulin aspart  0-6 Units Subcutaneous Q4H   insulin aspart  0-9 Units Subcutaneous Q4H   lidocaine-EPINEPHrine (PF)  10 mL Infiltration Once   mouth rinse  15 mL Mouth Rinse 4 times per day   pantoprazole (PROTONIX) IV  40 mg Intravenous Q12H   sodium chloride flush  10-40 mL Intracatheter Q12H   thiamine (VITAMIN B1) injection  100 mg Intravenous Daily   Wahid Holley  11/07/2023, 11:46 AM

## 2023-11-07 NOTE — Progress Notes (Signed)
PHARMACY - ANTICOAGULATION CONSULT NOTE  Pharmacy Consult for heparin Indication: atrial fibrillation  Labs: Recent Labs    11/05/23 0247 11/06/23 0345 11/06/23 1950 11/07/23 0319  HGB 9.1* 9.2*  --  9.3*  HCT 27.4* 27.9*  --  28.3*  PLT 316 309  --  320  HEPARINUNFRC  --   --  0.29* 0.22*  CREATININE 6.10* 7.45*  --  5.48*   Assessment: 68yo female subtherapeutic on heparin with lower heparin level despite increased rate; no infusion issues or signs of bleeding per RN.  Goal of Therapy:  Heparin level 0.3-0.7 units/ml   Plan:  Increase heparin infusion by 2 units/kg/hr to 1700 units/hr. Check level in 8 hours.   Vernard Gambles, PharmD, BCPS 11/07/2023 4:58 AM

## 2023-11-07 NOTE — Progress Notes (Signed)
Occupational Therapy Treatment Patient Details Name: [name] MRN: [id number] DOB: Mar 29, 1956 Today's Date: 11/07/2023   History of present illness Patient is a 68 y.o.  female - who presented with abdominal pain-found to have acute diverticulitis with contained perforation involving the descending colon. 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  Past medical history of CKD 5, A-fib on Eliquis, CAD   OT comments  Pt is making continued progress towards acute OT rehab goals. Pt continues to be limited by deficits listed below. Overall, pt completed bed mobility tasks with MIN A using log roll technique. Pt observed with good static sitting balance with BUE support and tolerated sitting EOB ~5 minutes with occasional CGA. Pt completed STS and functional step pivot transfer from bed to chair with MIN A+2 for management of lines. Pt limited on this date due to reports of nausea and decreased activity tolerance but was in agreement to remain seated in chair ~30 minutes to increase time OOB. OT to continue following pt acutely to address functional needs with the recommendation of inpatient OT services >3hrs/day to maximize functional independence.       If plan is discharge home, recommend the following:  A lot of help with walking and/or transfers;A lot of help with bathing/dressing/bathroom;Assistance with cooking/housework;Direct supervision/assist for financial management;Direct supervision/assist for medications management;Assist for transportation;Help with stairs or ramp for entrance   Equipment Recommendations  Other (comment) (defer)    Recommendations for Other Services      Precautions / Restrictions Precautions Precautions: Fall Precaution/Restrictions Comments: NGT Restrictions Weight Bearing Restrictions Per Provider Order: No       Mobility Bed Mobility Overal bed mobility: Needs Assistance Bed Mobility: Supine to Sit     Supine to sit: Min assist,  Used rails     General bed mobility comments: cues for logroll technique and min A for trunk elevation    Transfers Overall transfer level: Needs assistance Equipment used: 2 person hand held assist Transfers: Sit to/from Stand Sit to Stand: Min assist     Step pivot transfers: Min assist, +2 safety/equipment     General transfer comment: Pt performed sit to stand transfer with MIN A with HHA. Pt able to step pivot towards chair with HHA and verbal cues for sequencing. Pt required verbal cues for hand placement when descending to have a seat.     Balance Overall balance assessment: Needs assistance Sitting-balance support: Feet supported, Bilateral upper extremity supported Sitting balance-Leahy Scale: Good Sitting balance - Comments: static sitting EOB   Standing balance support: Bilateral upper extremity supported, During functional activity Standing balance-Leahy Scale: Poor Standing balance comment: HHA step pivot transfer to chair                           ADL either performed or assessed with clinical judgement   ADL Overall ADL's : Needs assistance/impaired                                     Functional mobility during ADLs: Minimal assistance General ADL Comments: Pt required MIN A for bed mobility tasks using log roll technique. Pt able to step pivot from bed to chair with HHA +2 for line management    Extremity/Trunk Assessment Upper Extremity Assessment Upper Extremity Assessment: Overall WFL for tasks assessed   Lower Extremity Assessment Lower Extremity Assessment: Defer to PT  evaluation        Vision   Vision Assessment?: No apparent visual deficits   Perception Perception Perception: Not tested   Praxis Praxis Praxis: Not tested   Communication Communication Communication: No apparent difficulties   Cognition Arousal: Alert Behavior During Therapy: WFL for tasks assessed/performed Cognition: No apparent  impairments             OT - Cognition Comments: Pt answered all questions appropriately. Able to follow multi-step commands.                 Following commands: Intact        Cueing   Cueing Techniques: Verbal cues  Exercises      Shoulder Instructions       General Comments VSS seated in chair    Pertinent Vitals/ Pain       Pain Assessment Pain Assessment: Faces Faces Pain Scale: Hurts a little bit Pain Location: stomach-nausea Pain Descriptors / Indicators: Grimacing, Other (Comment) Pain Intervention(s): Limited activity within patient's tolerance, Monitored during session  Home Living                                          Prior Functioning/Environment              Frequency  Min 1X/week        Progress Toward Goals  OT Goals(current goals can now be found in the care plan section)  Progress towards OT goals: Progressing toward goals  Acute Rehab OT Goals Patient Stated Goal: none stated OT Goal Formulation: With patient Time For Goal Achievement: 11/17/23 Potential to Achieve Goals: Good ADL Goals Pt Will Perform Upper Body Dressing: with contact guard assist;sitting Pt Will Perform Lower Body Dressing: with min assist;with adaptive equipment;sitting/lateral leans;sit to/from stand Pt Will Transfer to Toilet: with contact guard assist;ambulating;regular height toilet Additional ADL Goal #1: Pt will tolerate OOB activity x10 min in prep for ADLs  Plan      Co-evaluation                 AM-PAC OT "6 Clicks" Daily Activity     Outcome Measure   Help from another person eating meals?: Total (Simultaneous filing. User may not have seen previous data.) Help from another person taking care of personal grooming?: A Little Help from another person toileting, which includes using toliet, bedpan, or urinal?: A Lot Help from another person bathing (including washing, rinsing, drying)?: A Lot Help from another  person to put on and taking off regular upper body clothing?: A Lot Help from another person to put on and taking off regular lower body clothing?: A Lot 6 Click Score: 12 (Simultaneous filing. User may not have seen previous data.)    End of Session Equipment Utilized During Treatment: Gait belt  OT Visit Diagnosis: Unsteadiness on feet (R26.81);Other abnormalities of gait and mobility (R26.89);Muscle weakness (generalized) (M62.81)   Activity Tolerance Patient tolerated treatment well   Patient Left in chair;with call bell/phone within reach   Nurse Communication Mobility status        Time: 5621-3086 OT Time Calculation (min): 15 min  Charges: OT General Charges $OT Visit: 1 Visit OT Treatments $Therapeutic Activity: 8-22 mins  [address] Darliss Cheney 11/07/2023, 2:57 PM

## 2023-11-07 NOTE — Progress Notes (Signed)
Patient is alert, awake, lethargic . NG tube clamped during dayshift. Pt experienced SOB, nausea. reconnected NG tube  during nighshift , 250 ml green,drainage. @ 2316 , BP 174/ 67. Was given 10 mg IV hydralazine, 4 mg Zofran for nausea.Received call from Telemetry PT had 5 beats VT. MD notified. Abdominal wound vac is leaking. Wound care consulted.  Colostomy site is intact , dry out put. Foley is patent , no S/S of infection noted. Completed CHG bath.

## 2023-11-07 NOTE — Progress Notes (Addendum)
Nutrition Follow-up  DOCUMENTATION CODES:   Obesity unspecified  INTERVENTION:   -TPN management per pharmacy -Recommend MVI daily x 10 days -Recommend 100 mg thiamine daily x 7 days -Monitor Mg, K, and Phos and replete as needed secondary to high refeeding risk  -RD will follow for diet advancement and add supplements as appropriate   NUTRITION DIAGNOSIS:   Increased nutrient needs related to post-op healing as evidenced by estimated needs.  Ongoing  GOAL:   Patient will meet greater than or equal to 90% of their needs  Progressing   MONITOR:   Diet advancement  REASON FOR ASSESSMENT:   NPO/Clear Liquid Diet    ASSESSMENT:   Pt with hx of CKD 5 following with [facility] for evaluation for renal transplant, atrial fibrillation on anticoagulation, CAD, NSTEMI, PAD, CVA, diabetes type 2, hypertension, hyperlipidemia, anemia, who presented for acute onset left lower quadrant abdominal pain. She has a history of partial right colectomy for polyps reported noncancerous, remote history of partial hysterectomy. Found to have complicated diverticulitis with contained perforation.  2/4- advanced to clear liquid diet 2/5- CT revealed no abscess 2/6- per nephrology notes, pt refusing long term dialysis 2/7- NPO, CT revealed worsening diverticulitis with increased free air and suspected extravasation of oral contrast, s/p non-tunneled HD cath placement, first HD treatment 2/8- s/p Procedure: Exploratory laparotomy, left colectomy with mobilization of the splenic flexure, and end colostomy placement  2/13- TPN initiated   Reviewed I/O's: -1.1 L x 24 hours and +287 ml since admission  NGT output: 1.2 L x 24 hours  Colostomy output: 150 ml x 24 hours  Pt unavailable at time of visit. Attempted to speak with pt via call to hospital room phone, however, unable to reach. RD unable to obtain further nutrition-related history or complete nutrition-focused physical exam at this time.     CWOCN following for ostomy care/ education and wound vac dressing changes (scheduled for Monday and Thursday).   Per general surgery notes, some ostomy output today (100 ml as of this AM). Plan for NGT clamping trials. NGT output decreasing. Noted CT revealed fluid and lt gutter (possibly irrigant fluid from surgery); plan for continuing antibiotics and monitoring at this time.   Case discussed with RN, pt tolerated clamping trial well yesterday. Pt experiencing SOB, nausea, and dry heaves this AM; NGT reconnected to suction during the night.   Pt remains NPO. Plan for TPN today. Discussed with pharmacist regarding pt needs. Pt is at high refeeding risk. Will add MVI and thiamine. RD also discussed insulin needs with pharmacist. Insulin adjusted to every 4 hours and possible plan to increase medium scale. Plan to start TPN at 40 ml/hr with goal rate around 85 ml/hr.   Labs reviewed: Mg and K WNL, Phos: 7, CBGS: 157-250 (inpatient orders for glycemic control are 0-6 units insulin aspart every 4 hours).    Diet Order:   Diet Order             Diet NPO time specified Except for: Sips with Meds  Diet effective now                   EDUCATION NEEDS:   No education needs have been identified at this time  Skin:  Skin Assessment: Skin Integrity Issues: Skin Integrity Issues:: Wound VAC Wound Vac: abdomen  Last BM:  11/07/23 (130 ml via colostomy)  Height:   Ht Readings from Last 1 Encounters:  11/02/23 5' 7.01" (1.702 m)    Weight:  Wt Readings from Last 1 Encounters:  11/06/23 102.9 kg    Ideal Body Weight:  61.4 kg  BMI:  Body mass index is 35.52 kg/m.  Estimated Nutritional Needs:   Kcal:  2250-2450  Protein:  120-135 grams  Fluid:  1000 ml + UOP    Levada Schilling, RD, LDN, CDCES Registered Dietitian III Certified Diabetes Care and Education Specialist If unable to reach this RD, please use "RD Inpatient" group chat on secure chat between hours of 8am-4 pm  daily

## 2023-11-07 NOTE — Progress Notes (Signed)
PHARMACY - ANTICOAGULATION CONSULT NOTE  Pharmacy Consult:  Heparin Indication: atrial fibrillation  Allergies  Allergen Reactions   Nsaids Other (See Comments)    CKD stage 4   Lisinopril Other (See Comments)    coughing   Peanut-Containing Drug Products Itching and Other (See Comments)    GI intolerance - diarrhea    Patient Measurements: Height: 5' 7.01" (170.2 cm) Weight: 102.9 kg (226 lb 13.7 oz) IBW/kg (Calculated) : 61.62 Heparin Dosing Weight: 83 kg  Vital Signs:    Labs: Recent Labs    11/05/23 0247 11/06/23 0345 11/06/23 1950 11/07/23 0319 11/07/23 1431  HGB 9.1* 9.2*  --  9.3*  --   HCT 27.4* 27.9*  --  28.3*  --   PLT 316 309  --  320  --   HEPARINUNFRC  --   --  0.29* 0.22* 0.36  CREATININE 6.10* 7.45*  --  5.48*  --     Estimated Creatinine Clearance: 12.1 mL/min (A) (by C-G formula based on SCr of 5.48 mg/dL (H)).  Medications:  PTA apixaban 2.5mg  PO BID (last dose 10/27/2023 AM)  Assessment: 31 yoF presents with abdominal pain with complicated diverticulitis w/ contained perforation and possible need for colectomy. PMH: CKD 5, A fib (apixaban), CAD, and CVA.  Pharmacy consulted to dose IV heparin for Afib. 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   Heparin resumed 2/12 after being held for bleeding Heparin level now therapeutic   Goal of Therapy:  Heparin level 0.3-0.7 units/ml Monitor platelets by anticoagulation protocol: Yes   Plan:  Heparin to 1800 units / hr Monitor for bleeding, Trend Hgb  Thank you Okey Regal, PharmD

## 2023-11-07 NOTE — Plan of Care (Signed)
Problem: Coping: Goal: Ability to adjust to condition or change in health will improve Outcome: Progressing   Problem: Health Behavior/Discharge Planning: Goal: Ability to manage health-related needs will improve Outcome: Progressing

## 2023-11-07 NOTE — Progress Notes (Signed)
Patient is alert, awake . BP 164/66 hr 78 was given 10 mg IV  prn Hydralazine.

## 2023-11-07 NOTE — Plan of Care (Signed)
  Problem: Education: Goal: Ability to describe self-care measures that may prevent or decrease complications (Diabetes Survival Skills Education) will improve Outcome: Progressing Goal: Individualized Educational Video(s) Outcome: Progressing   Problem: Coping: Goal: Ability to adjust to condition or change in health will improve Outcome: Progressing   Problem: Health Behavior/Discharge Planning: Goal: Ability to identify and utilize available resources and services will improve Outcome: Progressing Goal: Ability to manage health-related needs will improve Outcome: Progressing   Problem: Metabolic: Goal: Ability to maintain appropriate glucose levels will improve Outcome: Progressing   Problem: Nutritional: Goal: Maintenance of adequate nutrition will improve Outcome: Progressing Goal: Progress toward achieving an optimal weight will improve Outcome: Progressing   Problem: Skin Integrity: Goal: Risk for impaired skin integrity will decrease Outcome: Progressing   Problem: Tissue Perfusion: Goal: Adequacy of tissue perfusion will improve Outcome: Progressing   Problem: Education: Goal: Knowledge of General Education information will improve Description: Including pain rating scale, medication(s)/side effects and non-pharmacologic comfort measures Outcome: Progressing   Problem: Health Behavior/Discharge Planning: Goal: Ability to manage health-related needs will improve Outcome: Progressing   Problem: Clinical Measurements: Goal: Ability to maintain clinical measurements within normal limits will improve Outcome: Progressing Goal: Will remain free from infection Outcome: Progressing Goal: Diagnostic test results will improve Outcome: Progressing Goal: Respiratory complications will improve Outcome: Progressing Goal: Cardiovascular complication will be avoided Outcome: Progressing   Problem: Activity: Goal: Risk for activity intolerance will decrease Outcome:  Progressing   Problem: Nutrition: Goal: Adequate nutrition will be maintained Outcome: Progressing   Problem: Coping: Goal: Level of anxiety will decrease Outcome: Progressing   Problem: Elimination: Goal: Will not experience complications related to bowel motility Outcome: Progressing Goal: Will not experience complications related to urinary retention Outcome: Progressing   Problem: Pain Managment: Goal: General experience of comfort will improve and/or be controlled Outcome: Progressing   Problem: Safety: Goal: Ability to remain free from injury will improve Outcome: Progressing   Problem: Skin Integrity: Goal: Risk for impaired skin integrity will decrease Outcome: Progressing   Problem: Education: Goal: Knowledge of the prescribed therapeutic regimen will improve Outcome: Progressing   Problem: Coping: Goal: Ability to identify and develop effective coping behavior will improve Outcome: Progressing   Problem: Clinical Measurements: Goal: Quality of life will improve Outcome: Progressing   Problem: Respiratory: Goal: Verbalizations of increased ease of respirations will increase Outcome: Progressing   Problem: Role Relationship: Goal: Family's ability to cope with current situation will improve Outcome: Progressing Goal: Ability to verbalize concerns, feelings, and thoughts to partner or family member will improve Outcome: Progressing   Problem: Pain Management: Goal: Satisfaction with pain management regimen will improve Outcome: Progressing

## 2023-11-07 NOTE — Progress Notes (Signed)
Palliative Medicine Progress Note   Patient Name: [name]       Date: 11/07/2023 DOB: Apr 27, 1956  Age: 68 y.o. MRN#: [id number] Attending Physician: Leroy Sea, MD Primary Care Physician: Hillery Aldo, NP Admit Date: 10/27/2023   HPI/Patient Profile: 68 y.o. female  with past medical history of CKD stage V, A-fib on Eliquis, DM type 2, CAD, and HTN who presented to the ED on 10/27/2023 with abdominal pain and was found to have acute diverticulitis with contained perforation involving the descending colon.  Hospital course complicated by AKI on CKD stage V, which progressed to ESRD.    Palliative Medicine was consulted for goals of care and medical decision making.   Significant Events: 2/5 - Initial palliative consult. Patient was adamant not to start to dialysis, understanding the outcome would be terminal in the setting of progressive renal failure.  2/6 - CT abdomen/pelvis showed worsening diverticulitis of descending colon with perforation and increasing  free air and suspected oral contrast extravasation in the mid left abdomen 2/7 - worsening AKI, transition to full comfort measures.  However multiple family members arrived and patient ultimately revoked comfort care opting for full code and full scope.  Underwent first HD treatment. 2/8 -underwent exploratory laparotomy with resection and colostomy    Subjective: Chart reviewed including vital signs, labs, imaging, progress notes, and orders. Update received from RN. Note plan to start TPN today.   I met with patient at bedside as a follow-up for palliative medicine needs and emotional support. We reviewed her hospital course and current clinical condition.   [name] tells me her family pressured her into starting  dialysis, and she expresses some resentment about this stating "they are not here seeing me go through this". She tells me she feels "miserable". I provided reassurance that she can de-escalate treatment and transition to comfort at any time, but also acknowledged the pressure to appease family.   [name] reflects on the concept of dying; she shares her experience with the deaths of her son and her mother. She feels the outcome of her current medical situation will be death, and expresses concern "this will all be for nothing".    We reviewed code status, as she is currently documented as full code. We review  what ACLS/resuscitation efforts would look like, and Camri is  very clear she does not want to go through this. She is agreeable to DNR/DNI status. Emotional support provided.   Dr. Thedore Mins updated on discussion as per above.    Objective:  Physical Exam Vitals reviewed.  Constitutional:      General: She is not in acute distress.    Appearance: She is ill-appearing.  HENT:     Head:     Comments: NGT to suction Pulmonary:     Effort: Pulmonary effort is normal.  Abdominal:     Comments: colostomy  Neurological:     Mental Status: She is alert and oriented to person, place, and time.             Palliative Medicine Assessment & Plan   Assessment: Principal Problem:   Diverticulitis of large intestine with complication Active Problems:   Chronic kidney disease, stage 5 (HCC)    Recommendations/Plan: Code status changed to DNR/DNI Continue all current interventions PMT will continue to follow   Prognosis:  Unable to determine  Discharge Planning: To Be Determined   Thank you for allowing the Palliative Medicine Team to assist in the care of this patient.   Time: 50 minutes   Merry Proud, NP   Please contact Palliative Medicine Team phone at [phone] for questions and concerns.  For individual providers, please see AMION.

## 2023-11-08 ENCOUNTER — Inpatient Hospital Stay (HOSPITAL_COMMUNITY): Payer: Medicare PPO

## 2023-11-08 DIAGNOSIS — Z7189 Other specified counseling: Secondary | ICD-10-CM | POA: Diagnosis not present

## 2023-11-08 DIAGNOSIS — K5732 Diverticulitis of large intestine without perforation or abscess without bleeding: Secondary | ICD-10-CM | POA: Diagnosis not present

## 2023-11-08 DIAGNOSIS — Z515 Encounter for palliative care: Secondary | ICD-10-CM | POA: Diagnosis not present

## 2023-11-08 HISTORY — PX: IR FLUORO GUIDE CV LINE LEFT: IMG2282

## 2023-11-08 HISTORY — PX: IR FLUORO GUIDE CV LINE RIGHT: IMG2283

## 2023-11-08 HISTORY — PX: IR US GUIDE VASC ACCESS LEFT: IMG2389

## 2023-11-08 LAB — CBC WITH DIFFERENTIAL/PLATELET
Abs Immature Granulocytes: 1.59 10*3/uL — ABNORMAL HIGH (ref 0.00–0.07)
Basophils Absolute: 0.2 10*3/uL — ABNORMAL HIGH (ref 0.0–0.1)
Basophils Relative: 1 %
Eosinophils Absolute: 0.4 10*3/uL (ref 0.0–0.5)
Eosinophils Relative: 1 %
HCT: 28.2 % — ABNORMAL LOW (ref 36.0–46.0)
Hemoglobin: 9.2 g/dL — ABNORMAL LOW (ref 12.0–15.0)
Immature Granulocytes: 6 %
Lymphocytes Relative: 3 %
Lymphs Abs: 0.8 10*3/uL (ref 0.7–4.0)
MCH: 26.4 pg (ref 26.0–34.0)
MCHC: 32.6 g/dL (ref 30.0–36.0)
MCV: 80.8 fL (ref 80.0–100.0)
Monocytes Absolute: 2 10*3/uL — ABNORMAL HIGH (ref 0.1–1.0)
Monocytes Relative: 7 %
Neutro Abs: 22.9 10*3/uL — ABNORMAL HIGH (ref 1.7–7.7)
Neutrophils Relative %: 82 %
Platelets: 315 10*3/uL (ref 150–400)
RBC: 3.49 MIL/uL — ABNORMAL LOW (ref 3.87–5.11)
RDW: 15.9 % — ABNORMAL HIGH (ref 11.5–15.5)
Smear Review: NORMAL
WBC: 27.8 10*3/uL — ABNORMAL HIGH (ref 4.0–10.5)
nRBC: 0 % (ref 0.0–0.2)

## 2023-11-08 LAB — C-REACTIVE PROTEIN: CRP: 16.2 mg/dL — ABNORMAL HIGH (ref <1.0)

## 2023-11-08 LAB — GLUCOSE, CAPILLARY
Glucose-Capillary: 425 mg/dL — ABNORMAL HIGH (ref 70–99)
Glucose-Capillary: 269 mg/dL — ABNORMAL HIGH (ref 70–99)
Glucose-Capillary: 285 mg/dL — ABNORMAL HIGH (ref 70–99)
Glucose-Capillary: 332 mg/dL — ABNORMAL HIGH (ref 70–99)
Glucose-Capillary: 261 mg/dL — ABNORMAL HIGH (ref 70–99)

## 2023-11-08 LAB — MAGNESIUM: Magnesium: 2.1 mg/dL (ref 1.7–2.4)

## 2023-11-08 LAB — BASIC METABOLIC PANEL
Anion gap: 17 — ABNORMAL HIGH (ref 5–15)
BUN: 55 mg/dL — ABNORMAL HIGH (ref 8–23)
CO2: 22 mmol/L (ref 22–32)
Calcium: 7.4 mg/dL — ABNORMAL LOW (ref 8.9–10.3)
Chloride: 95 mmol/L — ABNORMAL LOW (ref 98–111)
Creatinine, Ser: 7.14 mg/dL — ABNORMAL HIGH (ref 0.44–1.00)
GFR, Estimated: 6 mL/min — ABNORMAL LOW (ref >60)
Glucose, Bld: 344 mg/dL — ABNORMAL HIGH (ref 70–99)
Potassium: 3.1 mmol/L — ABNORMAL LOW (ref 3.5–5.1)
Sodium: 134 mmol/L — ABNORMAL LOW (ref 135–145)

## 2023-11-08 LAB — HEPARIN LEVEL (UNFRACTIONATED): Heparin Unfractionated: 0.51 [IU]/mL (ref 0.30–0.70)

## 2023-11-08 LAB — PHOSPHORUS: Phosphorus: 4.6 mg/dL (ref 2.5–4.6)

## 2023-11-08 LAB — PROCALCITONIN: Procalcitonin: 17.7 ng/mL

## 2023-11-08 MED ORDER — INSULIN ASPART 100 UNIT/ML IJ SOLN
0.0000 [IU] | INTRAMUSCULAR | Status: DC
  Administered 2023-11-08: 11 [IU] via SUBCUTANEOUS
  Administered 2023-11-08: 8 [IU] via SUBCUTANEOUS
  Administered 2023-11-09: 11 [IU] via SUBCUTANEOUS
  Administered 2023-11-09: 8 [IU] via SUBCUTANEOUS

## 2023-11-08 MED ORDER — ERYTHROMYCIN ETHYLSUCCINATE 400 MG/5ML PO SUSR
400.0000 mg | Freq: Four times a day (QID) | ORAL | Status: DC
Start: 2023-11-08 — End: 2023-11-12
  Administered 2023-11-08 – 2023-11-12 (×13): 400 mg via ORAL
  Filled 2023-11-08 (×18): qty 5

## 2023-11-08 MED ORDER — HYDROMORPHONE HCL 1 MG/ML IJ SOLN: 0.5000 mg | Freq: Once | INTRAMUSCULAR | Status: DC

## 2023-11-08 MED ORDER — TRACE MINERALS CU-MN-SE-ZN 300-55-60-3000 MCG/ML IV SOLN: INTRAVENOUS | Status: DC

## 2023-11-08 MED ORDER — TRACE MINERALS CU-MN-SE-ZN 300-55-60-3000 MCG/ML IV SOLN
INTRAVENOUS | Status: AC
  Filled 2023-11-08: qty 384

## 2023-11-08 MED ORDER — HEPARIN SODIUM (PORCINE) 1000 UNIT/ML IJ SOLN
INTRAMUSCULAR | Status: AC
  Administered 2023-11-08: 2800 [IU]
  Filled 2023-11-08: qty 3

## 2023-11-08 MED ORDER — LIDOCAINE HCL 1 % IJ SOLN
20.0000 mL | Freq: Once | INTRAMUSCULAR | Status: DC
  Filled 2023-11-08: qty 20

## 2023-11-08 MED ORDER — HEPARIN SODIUM (PORCINE) 1000 UNIT/ML IJ SOLN
INTRAMUSCULAR | Status: AC
  Filled 2023-11-08: qty 10

## 2023-11-08 MED ORDER — POTASSIUM CHLORIDE 10 MEQ/50ML IV SOLN
10.0000 meq | INTRAVENOUS | Status: AC
  Administered 2023-11-08 (×2): 10 meq via INTRAVENOUS
  Filled 2023-11-08 (×2): qty 50

## 2023-11-08 MED ORDER — LIDOCAINE-EPINEPHRINE 1 %-1:100000 IJ SOLN
INTRAMUSCULAR | Status: AC
  Filled 2023-11-08: qty 1

## 2023-11-08 MED ORDER — HEPARIN SODIUM (PORCINE) 1000 UNIT/ML IJ SOLN: 2800.0000 [IU] | Freq: Once | INTRAMUSCULAR | Status: AC

## 2023-11-08 NOTE — Progress Notes (Signed)
PHARMACY - TOTAL PARENTERAL NUTRITION CONSULT NOTE   Indication: Prolonged ileus  Patient Measurements: Height: 5' 7.01" (170.2 cm) Weight: 102.9 kg (226 lb 13.7 oz) IBW/kg (Calculated) : 61.62 TPN AdjBW (KG): 71.3 Body mass index is 35.52 kg/m. Usual Weight: 102.9 kg  Assessment: 30 YOF who is s/p Hartmann's for sigmoid diverticulitis w/ perforation with prolonged ileus requiring TPN therapy. She has a PMH positive for CKD-V currently on iHD and T2DM  Glucose / Insulin: CBG's elevated, up to 425 overnight, received 16 units insulin via SSI in past 24 hours. HgbA1c 6.6 on 10/28/2023 Electrolytes: Na 134, K 3.1 (s/p KCL x1), Cl 95, Ca 7.4 [CoCa 8.9], Mag 2.1, Phos 4.6 Renal: iHD - sched for 2/14  Hepatic: ALT/AST/ALP WNL, Tbili 1.6, Alb 2.1 Intake / Output; MIVF: NG 150 mL, Colostomy 0  mL charted GI Imaging: 2/12 CT abd shows 1. Interval colostomy with fluid collection along the left gutter,dominant pocket measuring 10 x 6 x 5 cm. 2. No bowel obstruction GI Surgeries / Procedures:  2/8-  POD 5, s/p Hartmann's for Sigmoid diverticulitis with perforation   Central access: 11/07/2023 TPN start date: 11/07/2023  Nutritional Goals: Goal TPN rate is 85 mL/hr (provides 122 g of protein and 2400 kcals per day)  RD Assessment: Estimated Needs Total Energy Estimated Needs: 2250-2450 Total Protein Estimated Needs: 120-135 grams Total Fluid Estimated Needs: 1000 ml + UOP  Current Nutrition:  NPO and TPN  Plan:  Continue TPN at 28mL/hr at 1800 will provide 58 g protein and 1131 Kcal for today given CBG elevations  Electrolytes in TPN: Na 75 mEq/L, K 20 mEq/L, Ca 57mEq/L, Mg 2mEq/L, and Phos 63mmol/L. Cl:Ac 2:1, add 10u insulin regular to the TPN  Add standard MVI and trace elements to TPN Continue thiamine 100 mg IV in TPN x7 days (D2) Intensify to Moderate q4h SSI and adjust as needed  KCL IV x2 today  Monitor TPN labs on Mon/Thurs, and PRN  BMP, Mag, Phos tomorrow AM     Calton Dach, PharmD, BCCCP Clinical Pharmacist 11/08/2023 7:17 AM

## 2023-11-08 NOTE — Progress Notes (Signed)
Patient is alert, awake, resting quietly, @ 2124  BP 164/66 hr 76, was given 10 mg IV hydralazine. BS at 0400 425 MD notified , was given 9 units insulin Novolog , and BMP lab per ordered. Rechecked BS 269. PT is on TPN @ 40 ml/ hr , Heparin drip @ 51ml/hr. PT has mild brusing on left hand.

## 2023-11-08 NOTE — Progress Notes (Signed)
PHARMACY - ANTICOAGULATION CONSULT NOTE  Pharmacy Consult:  Heparin Indication: atrial fibrillation  Allergies  Allergen Reactions   Nsaids Other (See Comments)    CKD stage 4   Lisinopril Other (See Comments)    coughing   Peanut-Containing Drug Products Itching and Other (See Comments)    GI intolerance - diarrhea    Patient Measurements: Height: 5' 7.01" (170.2 cm) Weight: 99.4 kg (219 lb 2.2 oz) IBW/kg (Calculated) : 61.62 Heparin Dosing Weight: 83 kg  Vital Signs: Temp: 97.3 F (36.3 C) (02/14 0800) Temp Source: Oral (02/14 0800) BP: 145/59 (02/14 0800) Pulse Rate: 77 (02/14 0800)  Labs: Recent Labs    11/06/23 0345 11/06/23 1950 11/07/23 0319 11/07/23 1431 11/08/23 0620  HGB 9.2*  --  9.3*  --  9.2*  HCT 27.9*  --  28.3*  --  28.2*  PLT 309  --  320  --  315  HEPARINUNFRC  --    < > 0.22* 0.36 0.51  CREATININE 7.45*  --  5.48* 6.34* 7.14*   < > = values in this interval not displayed.    Estimated Creatinine Clearance: 9.1 mL/min (A) (by C-G formula based on SCr of 7.14 mg/dL (H)).  Medications:  PTA apixaban 2.5mg  PO BID (last dose 10/27/2023 AM)  Assessment: 60 yoF presents with abdominal pain with complicated diverticulitis w/ contained perforation and possible need for colectomy. PMH: CKD 5, A fib (apixaban), CAD, and CVA.  Pharmacy consulted to dose IV heparin for Afib. 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   2/14: Heparin level therapeutic at 0.51 with heparin running at 1800 u/h. Hgb stable in 9s, PLT stable in 300s. No signs of bleeding or issues with heparin infusion noted.   Goal of Therapy:  Heparin level 0.3-0.7 units/ml Monitor platelets by anticoagulation protocol: Yes   Plan:  Continue heparin at 1800 units / hr Monitor for bleeding, Trend Hgb   Jani Gravel, PharmD Clinical Pharmacist  11/08/2023 9:44 AM

## 2023-11-08 NOTE — Progress Notes (Signed)
Inpatient Rehab Admissions Coordinator:   Note pt starting TNA.  We will continue to follow for improvement.   Estill Dooms, PT, DPT Admissions Coordinator [phone] 11/08/23  1:36 PM

## 2023-11-08 NOTE — Progress Notes (Signed)
   11/08/23 1900  Vitals  Pulse Rate 85  Resp (!) 21  BP (!) 149/66  SpO2 100 %  O2 Device Nasal Cannula  Oxygen Therapy  O2 Flow Rate (L/min) 4 L/min  Patient Activity (if Appropriate) In bed  Pulse Oximetry Type Continuous  Oximetry Probe Site Changed No  During Treatment Monitoring  Blood Flow Rate (mL/min) 0 mL/min  Arterial Pressure (mmHg) -24.24 mmHg  Venous Pressure (mmHg) 41.61 mmHg  TMP (mmHg) 13.13 mmHg  Ultrafiltration Rate (mL/min) 590 mL/min  Dialysate Flow Rate (mL/min) 300 ml/min  Dialysate Potassium Concentration 4  Dialysate Calcium Concentration 2.5  Duration of HD Treatment -hour(s) 3 hour(s)  Cumulative Fluid Removed (mL) per Treatment  1000.1  HD Safety Checks Performed Yes  Intra-Hemodialysis Comments Tx completed   Received patient in bed to unit.  Alert and oriented.  Informed consent signed and in chart.   TX duration: 3 hours  Patient tolerated well.  Transported back to the room  Alert, without acute distress.  Hand-off given to patient's nurse.   Access used: Right subclavian Access issues: None  Total UF removed: 1000 Medication(s) given: See Wanda Plump, LPN  [facility]

## 2023-11-08 NOTE — Progress Notes (Signed)
PT Cancellation Note  Patient Details Name: [name] MRN: [id number] DOB: 29-Jul-1956   Cancelled Treatment:    Reason Eval/Treat Not Completed: (P) Patient declined, no reason specified (Pt reports feeling "sick" and fatigued and declines PT session at this time. Will continue to follow per PT POC.)   Johny Shock 11/08/2023, 2:20 PM

## 2023-11-08 NOTE — Progress Notes (Signed)
PROGRESS NOTE        PATIENT DETAILS Name: [name] Age: 68 y.o. Sex: female Date of Birth: Mar 16, 1956 Admit Date: 10/27/2023 Admitting Physician Dolly Rias, MD [id number], Devonne Doughty, NP  Brief Summary: Patient is a 68 y.o.  female with history of CKD 5, A-fib on Eliquis, CAD-who presented with abdominal pain-found to have acute diverticulitis with contained perforation involving the descending colon.  Hospital course complicated by worsening AKI-thought to have progressed to ESRD-requiring initiation of HD.  See below for further details.  Significant events: 2/2>> admit to [facility] 2/6>> CT abdomen worsening diverticulitis with perforation 2/7>> transitioned to full comfort measures-as with worsening AKI-however multiple family members arrived-comfort care revoked-now full code-full scope of treatment including HD/laparotomy with colostomy.  General surgery/nephrology reconsulted.  IR placed [facility] and patient underwent first hemodialysis.  2/8>> remains n.p.o-colectomy/colostomy later today.  Significant studies: 2/2>> CT abdomen/pelvis: Acute diverticulitis-descending colon with contained perforation 2/6>> CT abdomen/pelvis: Worsening diverticulitis of descending colon with perforation and increasing failure-suspected oral contrast extravasation left abdomen.  Significant microbiology data: 2/2>> COVID/influenza/RSV PCR: Negative  Procedures: 2/8>> by Dr. Freida Busman underwent exploratory laparotomy with Hartman's resection for perforated diverticulitis with diffuse peritonitis, colostomy in place. 2/13 - R IJ Trialysis catheter - IR 2/14 - Central line requested for TNA  Consults: Surgery Renal Palliative IR  Subjective:  Patient in bed, appears comfortable, denies any headache, no fever, no chest pain or pressure, no shortness of breath , no abdominal pain. No new focal weakness.    Objective: Vitals: Blood pressure (!) 145/59, pulse 77,  temperature (!) 97.3 F (36.3 C), temperature source Oral, resp. rate 13, height 5' 7.01" (1.702 m), weight 99.4 kg, SpO2 97%.   Exam:  Awake Alert, No new F.N deficits, NG-tube in place to intermittent suction, dark bilious secretion, colostomy bag in place, right IJ HD catheter,   [place].AT,[name], [name],   Symmetrical Chest wall movement, Good air movement bilaterally, CTAB RRR,No Gallops, Rubs or new Murmurs,  +ve B.Sounds, Abd Soft, No tenderness,   No Cyanosis, Clubbing or edema    Assessment/Plan:  Acute diverticulitis with contained perforation of the descending colon - Initially was managed conservatively, subsequently 2/8>> by Dr. Freida Busman general surgery she underwent exploratory laparotomy with Hartman's resection for perforated diverticulitis with diffuse peritonitis, colostomy in place, continue Zosyn, continue NG tube, defer further management of this issue to General surgery Case discussed with general surgery team on 11/05/2023, monitor closely on present treatment, does have rising leukocytosis but afebrile, repeat CT noted, case discussed with general surgery for now wait and watch and monitor.  Still requiring NG tube to intermittent suction on 11/07/2023, will start on IV TNA and obtain an appropriate access.   AKI on CKD 5 with progression to ESRD AKI likely hemodynamically mediated-has now likely progressed to ESRD.  HD initiated via right IJ temporary catheter.  Likely getting tunneled catheter on 11/07/2023 along with IJ PICC line for TNA.  CAD Currently no anginal symptoms Suspect not on antiplatelets as on anticoagulation with Eliquis/heparin Continue beta-blocker On Repatha injections as an outpatient.  PAF Sinus rhythm Continue Coreg Will resume IV heparin with caution on 11/06/2023.  Continue to monitor H&H.  Anemia of acute illness  Brought on by acute illness, underlying renal insufficiency now developing to ESRD, surgical blood loss, gradually  trending down, 2 units of  packed RBC on 11/04/2023, on IV PPI. CBC looks stable on 11/05/2023 will continue to monitor closely.  HTN BP stable-continue Coreg/amlodipine.  DM-2 (A1c 6.6 on 2/3) CBGs relatively stable on SSI-follow/optimize as diet get started back on.  Recent Labs    11/08/23 0434 11/08/23 0658 11/08/23 0759  GLUCAP 425* 269* 285*    Palliative care/goals of care Initially DNR-and wanted to do comfort care, now is full code wants to undergo aggressive treatment including surgery and dialysis.  Full code now.  Class 1 Obesity: Estimated body mass index is 34.31 kg/m as calculated from the following:   Height as of this encounter: 5' 7.01" (1.702 m).   Weight as of this encounter: 99.4 kg.    Code status:   Code Status: Limited: Do not attempt resuscitation (DNR) -DNR-LIMITED -Do Not Intubate/DNI    DVT Prophylaxis: Place and maintain sequential compression device Start: 11/06/23 0613 IV Heparin  Family Communication:  [name] [phone] updated 2/7  Disposition Plan: Status is: Inpatient Remains inpatient appropriate because: Severity of illness   Planned Discharge Destination:Residential hospice   Diet: Diet Order             Diet NPO time specified Except for: Sips with Meds  Diet effective now                   MEDICATIONS: Scheduled Meds:  sodium chloride   Intravenous Once   sodium chloride   Intravenous Once   Chlorhexidine Gluconate Cloth  6 each Topical Q0600   erythromycin  400 mg Oral Q6H   insulin aspart  0-15 Units Subcutaneous Q4H   lidocaine-EPINEPHrine (PF)  10 mL Infiltration Once   mouth rinse  15 mL Mouth Rinse 4 times per day   pantoprazole (PROTONIX) IV  40 mg Intravenous Q12H   sodium chloride flush  10-40 mL Intracatheter Q12H   Continuous Infusions:  heparin 1,800 Units/hr (11/07/23 2257)   piperacillin-tazobactam (ZOSYN)  IV 2.25 g (11/08/23 0510)   TPN ADULT (ION) 40 mL/hr at 11/07/23 1825   TPN ADULT  (ION)     PRN Meds:.acetaminophen **OR** acetaminophen, albuterol, hydrALAZINE, HYDROmorphone (DILAUDID) injection, menthol-cetylpyridinium, ondansetron (ZOFRAN) IV, mouth rinse   I have personally reviewed following labs and imaging studies  LABORATORY DATA:   Data Review:    Recent Labs  Lab 11/04/23 0209 11/05/23 0247 11/06/23 0345 11/07/23 0319 11/08/23 0620  WBC 19.0* 24.7* 26.8* 31.3* 27.8*  HGB 6.9* 9.1* 9.2* 9.3* 9.2*  HCT 20.7* 27.4* 27.9* 28.3* 28.2*  PLT 317 316 309 320 315  MCV 77.8* 79.9* 79.7* 80.9 80.8  MCH 25.9* 26.5 26.3 26.6 26.4  MCHC 33.3 33.2 33.0 32.9 32.6  RDW 14.6 15.4 15.8* 16.0* 15.9*  LYMPHSABS 0.9 1.1 0.5* 0.9 0.8  MONOABS 1.9* 2.1* 2.4* 1.9* 2.0*  EOSABS 0.1 0.1 0.3 0.3 0.4  BASOSABS 0.1 0.1 0.0 0.0 0.2*    Recent Labs  Lab 11/02/23 0133 11/02/23 0133 11/03/23 0310 11/04/23 0209 11/04/23 1455 11/05/23 0247 11/06/23 0345 11/07/23 0319 11/07/23 1431 11/08/23 0620  NA 132*  --  133*  --  136 135 134* 136 133* 134*  K 4.2  --  4.0  --  3.9 3.9 3.8 3.9 4.1 3.1*  CL 97*  --  95*  --  96* 94* 93* 96* 97* 95*  CO2 17*  --  19*  --  20* 21* 21* 20* 18* 22  ANIONGAP 18*  --  19*  --  22* 20* 20* 20* 18*  17*  GLUCOSE 212*  --  193*  --  224* 194* 254* 200* 216* 344*  BUN 94*  --  65*  --  81* 47* 62* 38* 45* 55*  CREATININE 9.20*  --  7.34*  --  9.18* 6.10* 7.45* 5.48* 6.34* 7.14*  AST 8*  --   --   --   --   --   --   --  16  --   ALT 9  --   --   --   --   --   --   --  14  --   ALKPHOS 29*  --   --   --   --   --   --   --  47  --   BILITOT 1.2  --   --   --   --   --   --   --  1.6*  --   ALBUMIN 2.0*  --  2.1*  --  1.8*  --   --   --  2.1*  --   CRP  --    < > 22.7* 24.8*  --  23.5* 19.3* 17.8*  --  16.2*  PROCALCITON  --    < > 41.53 42.39  --  30.19 25.65 19.62  --  17.70  TSH  --   --  1.046  --   --   --   --   --   --   --   BNP  --   --  78.5 74.4  --  179.0* 244.4*  --   --   --   MG  --    < > 2.1 2.3  --  2.1 2.3 2.1  --   2.1  PHOS  --   --  6.5*  --  7.0*  --   --   --  5.5* 4.6  CALCIUM 7.9*  --  7.5*  --  7.4* 7.7* 7.5* 7.6* 7.6* 7.4*   < > = values in this interval not displayed.      Recent Labs  Lab 11/03/23 0310 11/04/23 0209 11/04/23 1455 11/05/23 0247 11/06/23 0345 11/07/23 0319 11/07/23 1431 11/08/23 0620  CRP 22.7* 24.8*  --  23.5* 19.3* 17.8*  --  16.2*  PROCALCITON 41.53 42.39  --  30.19 25.65 19.62  --  17.70  TSH 1.046  --   --   --   --   --   --   --   BNP 78.5 74.4  --  179.0* 244.4*  --   --   --   MG 2.1 2.3  --  2.1 2.3 2.1  --  2.1  CALCIUM 7.5*  --    < > 7.7* 7.5* 7.6* 7.6* 7.4*   < > = values in this interval not displayed.    --------------------------------------------------------------------------------------------------------------- Lab Results  Component Value Date   CHOL 134 12/21/2019   HDL 49 12/21/2019   LDLCALC 69 12/21/2019   TRIG 82 12/21/2019   CHOLHDL 2.7 12/21/2019    Lab Results  Component Value Date   HGBA1C 6.6 (H) 10/28/2023   No results for input(s): "TSH", "T4TOTAL", "FREET4", "T3FREE", "THYROIDAB" in the last 72 hours.  No results for input(s): "VITAMINB12", "FOLATE", "FERRITIN", "TIBC", "IRON", "RETICCTPCT" in the last 72 hours. ------------------------------------------------------------------------------------------------------------------ Cardiac Enzymes No results for input(s): "CKMB", "TROPONINI", "MYOGLOBIN" in the last 168 hours.  Invalid input(s): "CK"  Micro Results No results found for this or any  previous visit (from the past 240 hours).   Radiology Reports VAS [place] LOWER EXTREMITY VENOUS (DVT) Result Date: 11/07/2023  Lower Venous DVT Study Patient Name:  [name] [facility]  Date of Exam:   11/07/2023 Medical Rec #: [id number]          Accession #:    [id number] Date of Birth: 1955/10/07          Patient Gender: F Patient Age:   75 years Exam Location:  [facility] Procedure:      VAS [place] LOWER EXTREMITY VENOUS (DVT)  Referring Phys: Tresa Endo OSBORNE --------------------------------------------------------------------------------  Indications: Pain, stroke, and A-fib, anemia.  Comparison Study: No prior exam. Performing Technologist: Fernande Bras  Examination Guidelines: A complete evaluation includes B-mode imaging, spectral Doppler, color Doppler, and power Doppler as needed of all accessible portions of each vessel. Bilateral testing is considered an integral part of a complete examination. Limited examinations for reoccurring indications may be performed as noted. The reflux portion of the exam is performed with the patient in reverse Trendelenburg.  +---------+---------------+---------+-----------+----------+--------------+ RIGHT    CompressibilityPhasicitySpontaneityPropertiesThrombus Aging +---------+---------------+---------+-----------+----------+--------------+ CFV      Full           Yes      Yes                                 +---------+---------------+---------+-----------+----------+--------------+ SFJ      Full           Yes      Yes                                 +---------+---------------+---------+-----------+----------+--------------+ FV Prox  Full                                                        +---------+---------------+---------+-----------+----------+--------------+ FV Mid   Full                                                        +---------+---------------+---------+-----------+----------+--------------+ FV DistalFull                                                        +---------+---------------+---------+-----------+----------+--------------+ PFV      Full                                                        +---------+---------------+---------+-----------+----------+--------------+ POP      Full           Yes      Yes                                 +---------+---------------+---------+-----------+----------+--------------+  PTV       Full                                                        +---------+---------------+---------+-----------+----------+--------------+ PERO     Full                                                        +---------+---------------+---------+-----------+----------+--------------+   +---------+---------------+---------+-----------+----------+--------------+ LEFT     CompressibilityPhasicitySpontaneityPropertiesThrombus Aging +---------+---------------+---------+-----------+----------+--------------+ CFV      Full           Yes      Yes                                 +---------+---------------+---------+-----------+----------+--------------+ SFJ      Full           Yes      Yes                                 +---------+---------------+---------+-----------+----------+--------------+ FV Prox  Full                                                        +---------+---------------+---------+-----------+----------+--------------+ FV Mid   Full                                                        +---------+---------------+---------+-----------+----------+--------------+ FV DistalFull                                                        +---------+---------------+---------+-----------+----------+--------------+ PFV      Full                                                        +---------+---------------+---------+-----------+----------+--------------+ POP      Full           Yes      Yes                                 +---------+---------------+---------+-----------+----------+--------------+ PTV      Full                                                        +---------+---------------+---------+-----------+----------+--------------+  PERO     Full                                                        +---------+---------------+---------+-----------+----------+--------------+     Summary: BILATERAL: - No evidence of deep vein  thrombosis seen in the lower extremities, bilaterally. -No evidence of popliteal cyst, bilaterally.   *See table(s) above for measurements and observations. Electronically signed by Heath Lark on 11/07/2023 at 4:05:14 PM.    Final    CT ABDOMEN PELVIS WO CONTRAST Result Date: 11/06/2023 CLINICAL DATA:  Diverticulitis, complication suspected EXAM: CT ABDOMEN AND PELVIS WITHOUT CONTRAST TECHNIQUE: Multidetector CT imaging of the abdomen and pelvis was performed following the standard protocol without IV contrast. RADIATION DOSE REDUCTION: This exam was performed according to the departmental dose-optimization program which includes automated exposure control, adjustment of the mA and/or kV according to patient size and/or use of iterative reconstruction technique. COMPARISON:  10/31/2023 FINDINGS: Lower chest: Volume loss and opacity in the lower lungs consistent with atelectasis. There is a central line with tip at the upper right atrium. Coronary atherosclerosis. Hepatobiliary: No focal liver abnormality. Calcific in the intermediate density in the gallbladder attributed to stones. No biliary dilatation or pericholecystic edema. Pancreas: Unremarkable. Spleen: Unremarkable. Adrenals/Urinary Tract: Negative adrenals. No hydronephrosis or stone. Renal cortical thinning and lobulation at the left upper pole. 3 mm stone at the upper pole left kidney. The urinary bladder is collapsed around a Foley catheter. Stomach/Bowel: Descending colostomy. Bowel anastomosis in the right abdomen. No evidence of bowel obstruction. Fluid collection with loculation appearance/peritoneal thickening along the left flank measuring up to 5 x 6 x 10 cm. Milder fluid accumulation in the ventral peritoneal space. Vascular/Lymphatic: Extensive atheromatous calcification of the aorta and iliacs. No mass or adenopathy. Reproductive:No pathologic findings. Other: No ascites or pneumoperitoneum. Musculoskeletal: No acute abnormalities.  T9  butterfly vertebra IMPRESSION: 1. Interval colostomy with fluid collection along the left gutter, dominant pocket measuring 10 x 6 x 5 cm. 2. No bowel obstruction. 3. Cholelithiasis and left nephrolithiasis. Electronically Signed   By: Tiburcio Pea M.D.   On: 11/06/2023 07:12   DG Chest Port 1 View Result Date: 11/03/2023 CLINICAL DATA:  Shortness of breath EXAM: PORTABLE CHEST - 1 VIEW COMPARISON:  11/11/2018 FINDINGS: Unchanged mild cardiomegaly and pulmonary vascular congestion. Interval worsening of left basilar opacity likely due to atelectasis. Lungs otherwise clear. Examination is limited due to expiratory phase of imaging. Interval placement of temporary right IJ hemodialysis catheter which terminates in the region of the right atrium. Nasogastric tube terminates in the left upper quadrant. IMPRESSION: Interval worsening of left basilar opacity which is likely due to atelectasis. Electronically Signed   By: Acquanetta Belling M.D.   On: 11/03/2023 07:22   IR Fluoro Guide CV Line Right Result Date: 11/01/2023 INDICATION: ESRD requiring HD. EXAM: NON-TUNNELED CENTRAL VENOUS HEMODIALYSIS CATHETER PLACEMENT WITH ULTRASOUND AND FLUOROSCOPIC GUIDANCE COMPARISON:  CT AP 10/31/2023. MEDICATIONS: Local anesthetic was administered. FLUOROSCOPY TIME:  Fluoroscopic dose; 35 mGy COMPLICATIONS: None immediate. PROCEDURE: Informed written consent was obtained from the patient and/or patient's representative after a discussion of the risks, benefits, and alternatives to treatment. Questions regarding the procedure were encouraged and answered. The RIGHT neck and chest were prepped with chlorhexidine in a sterile fashion, and a sterile drape was applied covering the  operative field. Maximum barrier sterile technique with sterile gowns and gloves were used for the procedure. A timeout was performed prior to the initiation of the procedure. After the overlying soft tissues were anesthetized, a small venotomy incision was  created and a micropuncture kit was utilized to access the external jugular vein. Real-time ultrasound guidance was utilized for vascular access including the acquisition of a permanent ultrasound image documenting patency of the accessed vessel. The microwire was utilized to measure appropriate catheter length. A stiff glidewire was advanced to the level of the IVC. Under fluoroscopic guidance, the venotomy was serially dilated, ultimately allowing placement of a 20 cm temporary Trialysis catheter with tip ultimately terminating within the superior aspect of the right atrium. Final catheter positioning was confirmed and documented with a spot radiographic image. The catheter aspirates and flushes normally. The catheter was flushed with appropriate volume heparin dwells. The catheter exit site was secured with a 2-0 Ethilon retention suture. A dressing was placed. The patient tolerated the procedure well without immediate post procedural complication. IMPRESSION: Successful placement of a RIGHT EXTERNAL jugular approach 20 cm non-tunneled dialysis catheter The tip of the catheter is positioned within the proximal RIGHT atrium. The catheter is ready for immediate use. PLAN: This catheter may be converted to a tunneled dialysis catheter at a later date as indicated. Roanna Banning, MD [facility] Radiology Electronically Signed   By: Roanna Banning M.D.   On: 11/01/2023 18:15   IR US Guide Vasc Access Right Result Date: 11/01/2023 INDICATION: ESRD requiring HD. EXAM: NON-TUNNELED CENTRAL VENOUS HEMODIALYSIS CATHETER PLACEMENT WITH ULTRASOUND AND FLUOROSCOPIC GUIDANCE COMPARISON:  CT AP 10/31/2023. MEDICATIONS: Local anesthetic was administered. FLUOROSCOPY TIME:  Fluoroscopic dose; 35 mGy COMPLICATIONS: None immediate. PROCEDURE: Informed written consent was obtained from the patient and/or patient's representative after a discussion of the risks, benefits, and  alternatives to treatment. Questions regarding the procedure were encouraged and answered. The RIGHT neck and chest were prepped with chlorhexidine in a sterile fashion, and a sterile drape was applied covering the operative field. Maximum barrier sterile technique with sterile gowns and gloves were used for the procedure. A timeout was performed prior to the initiation of the procedure. After the overlying soft tissues were anesthetized, a small venotomy incision was created and a micropuncture kit was utilized to access the external jugular vein. Real-time ultrasound guidance was utilized for vascular access including the acquisition of a permanent ultrasound image documenting patency of the accessed vessel. The microwire was utilized to measure appropriate catheter length. A stiff glidewire was advanced to the level of the IVC. Under fluoroscopic guidance, the venotomy was serially dilated, ultimately allowing placement of a 20 cm temporary Trialysis catheter with tip ultimately terminating within the superior aspect of the right atrium. Final catheter positioning was confirmed and documented with a spot radiographic image. The catheter aspirates and flushes normally. The catheter was flushed with appropriate volume heparin dwells. The catheter exit site was secured with a 2-0 Ethilon retention suture. A dressing was placed. The patient tolerated the procedure well without immediate post procedural complication. IMPRESSION: Successful placement of a RIGHT EXTERNAL jugular approach 20 cm non-tunneled dialysis catheter The tip of the catheter is positioned within the proximal RIGHT atrium. The catheter is ready for immediate use. PLAN: This catheter may be converted to a tunneled dialysis catheter at a later date as indicated. Roanna Banning, MD [facility] Radiology Electronically Signed   By: Gerome Sam.D.  On: 11/01/2023 18:15   CT ABDOMEN PELVIS WO  CONTRAST Result Date: 10/31/2023 CLINICAL DATA:  Diverticulitis, complication suspected. EXAM: CT ABDOMEN AND PELVIS WITHOUT CONTRAST TECHNIQUE: Multidetector CT imaging of the abdomen and pelvis was performed following the standard protocol without IV contrast. RADIATION DOSE REDUCTION: This exam was performed according to the departmental dose-optimization program which includes automated exposure control, adjustment of the mA and/or kV according to patient size and/or use of iterative reconstruction technique. COMPARISON:  10/27/2023. FINDINGS: Lower chest: Heart is enlarged and multi-vessel coronary artery calcifications are noted. Consolidation and patchy airspace disease is present at the lung bases bilaterally. Hepatobiliary: No focal liver abnormality is seen. Stones are present within the gallbladder. No biliary ductal dilatation. Pancreas: Unremarkable. No pancreatic ductal dilatation or surrounding inflammatory changes. Spleen: Normal in size without focal abnormality. Adrenals/Urinary Tract: The adrenal glands are within normal limits. Calcifications are present in the upper pole of the left kidney. There is a cyst in the lower pole of the right kidney. No hydronephrosis bilaterally. The bladder is unremarkable. Stomach/Bowel: The stomach is within normal limits. No bowel obstruction or pneumatosis is seen. Right hemicolectomy changes are noted. There is diffuse colonic wall thickening. Scattered diverticula are present along the colon. A few small foci of free air are noted in the mesentery in the mid left abdomen, slightly increased from the previous exam. There is suggestion of contrast extravasation in the region. There is fine no definite abscess is seen. Vascular/Lymphatic: Aortic atherosclerosis. No enlarged abdominal or pelvic lymph nodes. Reproductive: No acute abnormality. Other: Mild ascites is noted in all 4 quadrants. A fat containing umbilical hernia is noted. There is mild anasarca.  Musculoskeletal: The bony structures are stable. IMPRESSION: 1. Worsening diverticulitis of the descending colon with perforation and increasing free air and suspected oral contrast extravasation in the mid left abdomen. No definite abscess is seen. Surgical consultation is recommended. 2. Mild ascites. 3. Diffuse colonic wall thickening which may be due to associated inflammatory changes. 4. Patchy airspace disease at the lung bases with bilateral lower lobe consolidation. 5. Cholelithiasis. 6. Aortic atherosclerosis. Critical Value/emergent results were called by telephone at the time of interpretation on 10/31/2023 at 11:59 pm to provider Dr. Antionette Char, who verbally acknowledged these results. Electronically Signed   By: Thornell Sartorius M.D.   On: 10/31/2023 23:59   CT ABDOMEN PELVIS WO CONTRAST Result Date: 10/27/2023 CLINICAL DATA:  Acute nonlocalized abdominal pain. Chills and fever. Vomiting and diarrhea. EXAM: CT ABDOMEN AND PELVIS WITHOUT CONTRAST TECHNIQUE: Multidetector CT imaging of the abdomen and pelvis was performed following the standard protocol without IV contrast. RADIATION DOSE REDUCTION: This exam was performed according to the departmental dose-optimization program which includes automated exposure control, adjustment of the mA and/or kV according to patient size and/or use of iterative reconstruction technique. COMPARISON:  CT abdomen and pelvis 04/19/2021 FINDINGS: Lower chest: Bibasilar atelectasis.  No acute abnormality. Hepatobiliary: Cholelithiasis without evidence of acute cholecystitis. No biliary dilation. Unremarkable noncontrast appearance of the liver. Pancreas: Unremarkable. Spleen: Unremarkable. Adrenals/Urinary Tract: Normal adrenal glands. No urinary calculi or hydronephrosis. Unremarkable bladder. Stomach/Bowel: Stomach is within normal limits. No bowel obstruction. Colonic diverticulosis. Wall thickening, inflammatory stranding, and free fluid about the descending colon. Loosely  organized fluid and gas about the inflamed descending colon (circa series 3/image 49-50) compatible with contained perforation. The adjacent jejunum is inflamed. Postoperative change about the colon with ileocolonic anastomosis in the right upper quadrant. Vascular/Lymphatic: Aortic atherosclerosis. No enlarged abdominal or pelvic lymph nodes. Reproductive:  No acute abnormality. Other: Fat containing periumbilical hernia. Musculoskeletal: No acute fracture. IMPRESSION: 1. Acute diverticulitis of the descending colon with contained perforation. 2. Inflamed jejunum adjacent to the perforated diverticulitis. 3.  Aortic Atherosclerosis (ICD10-I70.0). Critical Value/emergent results were called by telephone at the time of interpretation on 10/27/2023 at 9:22 pm to provider [facility] , who verbally acknowledged these results. Electronically Signed   By: Minerva Fester M.D.   On: 10/27/2023 21:24    Signature  -   Susa Raring M.D on 11/08/2023 at 10:53 AM   -  To page go to [email or web address]

## 2023-11-08 NOTE — Progress Notes (Signed)
Patient ID: [name], female   DOB: Mar 21, 1956, 68 y.o.   MRN: [id number] [facility] Progress Note   Assessment/ Plan:   1.  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  After initial refusal of wanting to pursue renal replacement therapy, she changed her mind and asked to start dialysis with the intention of undergoing surgery for contained perforation of acute diverticulitis.   - #2 HD t2/12.HD #3 today - has non tunneled HD cath in for 6 days-  will need to be converted to [facility]  if she still wants to continue-  has not been possible due to elevated WBC - I had followed her as OP-  she had been heavily entertaining not doing dialysis.  I told her that she is in charge and she can stop at any time  "it doesn't feel like it "  2.  Acute diverticulitis with contained perforation of descending colon: On Zosyn and now status post exploratory laparotomy with Hartman's resection for perforated diverticulitis/peritonitis yesterday (2/8).  Surgical note reviewed from today along with wound assessment. - going to get TNA-   currently going thru her vascath-  I respectfully request another line to give TNA just so we done have issues with the vascath  3.  Anion gap metabolic acidosis: Likely secondary to combination of acute kidney injury and ongoing infection, improved with dialysis. 4.  Anemia: Likely secondary to chronic illness, iron stores significantly low and yesterday started on intravenous iron for loading prior to starting ESA.  She is also status post PRBC transfusion- overall better  Subjective:    Looks and feels terrible-  had wanted to have TDC placed but got another vascath instead-  also getting TNA thru her vascath-  slated for dialysis later today    Objective:   BP (!) 145/59 (BP Location: Right Arm)   Pulse 77   Temp (!) 97.3 F  (36.3 C) (Oral)   Resp 13   Ht 5' 7.01" (1.702 m)   Wt 99.4 kg   SpO2 97%   BMI 34.31 kg/m   Intake/Output Summary (Last 24 hours) at 11/08/2023 1224 Last data filed at 11/08/2023 0845 Gross per 24 hour  Intake 967.54 ml  Output 400 ml  Net 567.54 ml   Weight change:   Physical Exam: Gen: Uncomfortably resting in bed, NGT in place with LIS and bilious drainage CVS: Pulse regular rhythm, normal rate, S1 and S2 normal Resp: Clear to auscultation bilaterally with diminished breath sounds over bases, no rales Abd: Protuberant with colostomy bag in situ.  (Wound assessment noted on surgical note) Ext: Trace ankle edema.  No asterixis/myoclonic jerks noted  Imaging: VAS [place] LOWER EXTREMITY VENOUS (DVT) Result Date: 11/07/2023  Lower Venous DVT Study Patient Name:  [name] [facility]  Date of Exam:   11/07/2023 Medical Rec #: [id number]          Accession #:    [id number] Date of Birth: July 12, 1956          Patient Gender: F Patient Age:   40 years Exam Location:  [facility] Procedure:      VAS [place] LOWER EXTREMITY VENOUS (DVT) Referring Phys: Tresa Endo OSBORNE --------------------------------------------------------------------------------  Indications: Pain, stroke, and A-fib, anemia.  Comparison Study: No prior exam. Performing Technologist: Fernande Bras  Examination Guidelines: A complete evaluation includes B-mode imaging, spectral Doppler, color Doppler, and power Doppler as  needed of all accessible portions of each vessel. Bilateral testing is considered an integral part of a complete examination. Limited examinations for reoccurring indications may be performed as noted. The reflux portion of the exam is performed with the patient in reverse Trendelenburg.  +---------+---------------+---------+-----------+----------+--------------+ RIGHT    CompressibilityPhasicitySpontaneityPropertiesThrombus Aging +---------+---------------+---------+-----------+----------+--------------+ CFV       Full           Yes      Yes                                 +---------+---------------+---------+-----------+----------+--------------+ SFJ      Full           Yes      Yes                                 +---------+---------------+---------+-----------+----------+--------------+ FV Prox  Full                                                        +---------+---------------+---------+-----------+----------+--------------+ FV Mid   Full                                                        +---------+---------------+---------+-----------+----------+--------------+ FV DistalFull                                                        +---------+---------------+---------+-----------+----------+--------------+ PFV      Full                                                        +---------+---------------+---------+-----------+----------+--------------+ POP      Full           Yes      Yes                                 +---------+---------------+---------+-----------+----------+--------------+ PTV      Full                                                        +---------+---------------+---------+-----------+----------+--------------+ PERO     Full                                                        +---------+---------------+---------+-----------+----------+--------------+   +---------+---------------+---------+-----------+----------+--------------+ LEFT     CompressibilityPhasicitySpontaneityPropertiesThrombus  Aging +---------+---------------+---------+-----------+----------+--------------+ CFV      Full           Yes      Yes                                 +---------+---------------+---------+-----------+----------+--------------+ SFJ      Full           Yes      Yes                                 +---------+---------------+---------+-----------+----------+--------------+ FV Prox  Full                                                         +---------+---------------+---------+-----------+----------+--------------+ FV Mid   Full                                                        +---------+---------------+---------+-----------+----------+--------------+ FV DistalFull                                                        +---------+---------------+---------+-----------+----------+--------------+ PFV      Full                                                        +---------+---------------+---------+-----------+----------+--------------+ POP      Full           Yes      Yes                                 +---------+---------------+---------+-----------+----------+--------------+ PTV      Full                                                        +---------+---------------+---------+-----------+----------+--------------+ PERO     Full                                                        +---------+---------------+---------+-----------+----------+--------------+     Summary: BILATERAL: - No evidence of deep vein thrombosis seen in the lower extremities, bilaterally. -No evidence of popliteal cyst, bilaterally.   *See table(s) above for measurements and observations. Electronically signed by Heath Lark on 11/07/2023 at 4:05:14 PM.    Final      Labs: BMET Recent Labs  Lab  11/03/23 0310 11/04/23 1455 11/05/23 0247 11/06/23 0345 11/07/23 0319 11/07/23 1431 11/08/23 0620  NA 133* 136 135 134* 136 133* 134*  K 4.0 3.9 3.9 3.8 3.9 4.1 3.1*  CL 95* 96* 94* 93* 96* 97* 95*  CO2 19* 20* 21* 21* 20* 18* 22  GLUCOSE 193* 224* 194* 254* 200* 216* 344*  BUN 65* 81* 47* 62* 38* 45* 55*  CREATININE 7.34* 9.18* 6.10* 7.45* 5.48* 6.34* 7.14*  CALCIUM 7.5* 7.4* 7.7* 7.5* 7.6* 7.6* 7.4*  PHOS 6.5* 7.0*  --   --   --  5.5* 4.6   CBC Recent Labs  Lab 11/05/23 0247 11/06/23 0345 11/07/23 0319 11/08/23 0620  WBC 24.7* 26.8* 31.3* 27.8*  NEUTROABS 19.5* 23.6* 28.2* 22.9*   HGB 9.1* 9.2* 9.3* 9.2*  HCT 27.4* 27.9* 28.3* 28.2*  MCV 79.9* 79.7* 80.9 80.8  PLT 316 309 320 315    Medications:     sodium chloride   Intravenous Once   sodium chloride   Intravenous Once   Chlorhexidine Gluconate Cloth  6 each Topical Q0600   erythromycin  400 mg Oral Q6H   insulin aspart  0-15 Units Subcutaneous Q4H   lidocaine  20 mL Intradermal Once   mouth rinse  15 mL Mouth Rinse 4 times per day   pantoprazole (PROTONIX) IV  40 mg Intravenous Q12H   sodium chloride flush  10-40 mL Intracatheter Q12H   Maicy Filip A Amalee Olsen  11/08/2023, 12:24 PM

## 2023-11-08 NOTE — Progress Notes (Signed)
6 Days Post-Op   Subjective/Chief Complaint: PT doing well this AM Some nausea this AM   Objective: Vital signs in last 24 hours: Temp:  [97.3 F (36.3 C)-98.4 F (36.9 C)] 97.3 F (36.3 C) (02/14 0800) Pulse Rate:  [77-81] 77 (02/14 0800) Resp:  [13-18] 13 (02/14 0800) BP: (145-169)/(59-63) 145/59 (02/14 0800) SpO2:  [96 %-97 %] 97 % (02/14 0800) Last BM Date : 11/07/23  Intake/Output from previous day: 02/13 0701 - 02/14 0700 In: 967.5 [I.V.:967.5] Out: 150 [Emesis/NG output:150] Intake/Output this shift: No intake/output data recorded.  General appearance: alert and cooperative GI: soft, non-tender; bowel sounds normal; no masses,  no organomegaly and ostomy patent  Lab Results:  Recent Labs    11/07/23 0319 11/08/23 0620  WBC 31.3* 27.8*  HGB 9.3* 9.2*  HCT 28.3* 28.2*  PLT 320 315   BMET Recent Labs    11/07/23 1431 11/08/23 0620  NA 133* 134*  K 4.1 3.1*  CL 97* 95*  CO2 18* 22  GLUCOSE 216* 344*  BUN 45* 55*  CREATININE 6.34* 7.14*  CALCIUM 7.6* 7.4*   PT/INR No results for input(s): "LABPROT", "INR" in the last 72 hours. ABG No results for input(s): "PHART", "HCO3" in the last 72 hours.  Invalid input(s): "PCO2", "PO2"  Studies/Results: VAS [place] LOWER EXTREMITY VENOUS (DVT) Result Date: 11/07/2023  Lower Venous DVT Study Patient Name:  [name] [facility]  Date of Exam:   11/07/2023 Medical Rec #: [id number]          Accession #:    [id number] Date of Birth: 06-23-1956          Patient Gender: F Patient Age:   68 years Exam Location:  [facility] Procedure:      VAS [place] LOWER EXTREMITY VENOUS (DVT) Referring Phys: Tresa Endo OSBORNE --------------------------------------------------------------------------------  Indications: Pain, stroke, and A-fib, anemia.  Comparison Study: No prior exam. Performing Technologist: Fernande Bras  Examination Guidelines: A complete evaluation includes B-mode imaging, spectral Doppler, color Doppler, and power  Doppler as needed of all accessible portions of each vessel. Bilateral testing is considered an integral part of a complete examination. Limited examinations for reoccurring indications may be performed as noted. The reflux portion of the exam is performed with the patient in reverse Trendelenburg.  +---------+---------------+---------+-----------+----------+--------------+ RIGHT    CompressibilityPhasicitySpontaneityPropertiesThrombus Aging +---------+---------------+---------+-----------+----------+--------------+ CFV      Full           Yes      Yes                                 +---------+---------------+---------+-----------+----------+--------------+ SFJ      Full           Yes      Yes                                 +---------+---------------+---------+-----------+----------+--------------+ FV Prox  Full                                                        +---------+---------------+---------+-----------+----------+--------------+ FV Mid   Full                                                        +---------+---------------+---------+-----------+----------+--------------+  FV DistalFull                                                        +---------+---------------+---------+-----------+----------+--------------+ PFV      Full                                                        +---------+---------------+---------+-----------+----------+--------------+ POP      Full           Yes      Yes                                 +---------+---------------+---------+-----------+----------+--------------+ PTV      Full                                                        +---------+---------------+---------+-----------+----------+--------------+ PERO     Full                                                        +---------+---------------+---------+-----------+----------+--------------+    +---------+---------------+---------+-----------+----------+--------------+ LEFT     CompressibilityPhasicitySpontaneityPropertiesThrombus Aging +---------+---------------+---------+-----------+----------+--------------+ CFV      Full           Yes      Yes                                 +---------+---------------+---------+-----------+----------+--------------+ SFJ      Full           Yes      Yes                                 +---------+---------------+---------+-----------+----------+--------------+ FV Prox  Full                                                        +---------+---------------+---------+-----------+----------+--------------+ FV Mid   Full                                                        +---------+---------------+---------+-----------+----------+--------------+ FV DistalFull                                                        +---------+---------------+---------+-----------+----------+--------------+  PFV      Full                                                        +---------+---------------+---------+-----------+----------+--------------+ POP      Full           Yes      Yes                                 +---------+---------------+---------+-----------+----------+--------------+ PTV      Full                                                        +---------+---------------+---------+-----------+----------+--------------+ PERO     Full                                                        +---------+---------------+---------+-----------+----------+--------------+     Summary: BILATERAL: - No evidence of deep vein thrombosis seen in the lower extremities, bilaterally. -No evidence of popliteal cyst, bilaterally.   *See table(s) above for measurements and observations. Electronically signed by Heath Lark on 11/07/2023 at 4:05:14 PM.    Final     Anti-infectives: Anti-infectives (From admission, onward)     Start     Dose/Rate Route Frequency Ordered Stop   11/06/23 0815  piperacillin-tazobactam (ZOSYN) IVPB 2.25 g        2.25 g 100 mL/hr over 30 Minutes Intravenous Every 8 hours 11/06/23 0729     11/01/23 1415  piperacillin-tazobactam (ZOSYN) IVPB 2.25 g        2.25 g 100 mL/hr over 30 Minutes Intravenous Every 8 hours 11/01/23 1316 11/06/23 0602   10/29/23 1100  piperacillin-tazobactam (ZOSYN) IVPB 2.25 g  Status:  Discontinued        2.25 g 100 mL/hr over 30 Minutes Intravenous Every 8 hours 10/29/23 1006 11/01/23 1016   10/28/23 0600  piperacillin-tazobactam (ZOSYN) IVPB 2.25 g  Status:  Discontinued        2.25 g 100 mL/hr over 30 Minutes Intravenous Every 8 hours 10/27/23 2224 10/27/23 2235   10/28/23 0600  ceFEPIme (MAXIPIME) 1 g in sodium chloride 0.9 % 100 mL IVPB  Status:  Discontinued        1 g 200 mL/hr over 30 Minutes Intravenous Every 24 hours 10/27/23 2235 10/29/23 0954   10/28/23 0600  metroNIDAZOLE (FLAGYL) IVPB 500 mg  Status:  Discontinued        500 mg 100 mL/hr over 60 Minutes Intravenous Every 12 hours 10/27/23 2235 10/29/23 0954   10/27/23 2130  piperacillin-tazobactam (ZOSYN) IVPB 3.375 g        3.375 g 100 mL/hr over 30 Minutes Intravenous  Once 10/27/23 2128 10/27/23 2320       Assessment/Plan:  POD 6, s/p Hartmann's by Dr. Freida Busman 11/02/23 forSigmoid diverticulitis with perforation  -cont abx therapy given rising WBC  but unclear etiology currently.   duplex of LEs =  neg -clamping NGT  -start erythromycin -hgb 9.3 -therapies for mobilization. -VAC to midline wound, will change M/Th -WOC for colostomy care      FEN: NPO/NGT clamping/start TNA after new line placement VTE: heparin gtt - hgb stable ID: cefepime/flagyl 2/3>2/4; Zosyn 2/4>>   - per TRH -  PAF  HTN HLD AKI on CKD stage V - on HD CAD T2DM  LOS: 12 days    Axel Filler 11/08/2023

## 2023-11-09 DIAGNOSIS — Z515 Encounter for palliative care: Secondary | ICD-10-CM | POA: Diagnosis not present

## 2023-11-09 DIAGNOSIS — Z7189 Other specified counseling: Secondary | ICD-10-CM | POA: Diagnosis not present

## 2023-11-09 DIAGNOSIS — K5732 Diverticulitis of large intestine without perforation or abscess without bleeding: Secondary | ICD-10-CM | POA: Diagnosis not present

## 2023-11-09 LAB — BASIC METABOLIC PANEL
Anion gap: 13 (ref 5–15)
BUN: 38 mg/dL — ABNORMAL HIGH (ref 8–23)
CO2: 25 mmol/L (ref 22–32)
Calcium: 7.3 mg/dL — ABNORMAL LOW (ref 8.9–10.3)
Chloride: 95 mmol/L — ABNORMAL LOW (ref 98–111)
Creatinine, Ser: 5.43 mg/dL — ABNORMAL HIGH (ref 0.44–1.00)
GFR, Estimated: 8 mL/min — ABNORMAL LOW (ref >60)
Glucose, Bld: 358 mg/dL — ABNORMAL HIGH (ref 70–99)
Potassium: 3.4 mmol/L — ABNORMAL LOW (ref 3.5–5.1)
Sodium: 133 mmol/L — ABNORMAL LOW (ref 135–145)

## 2023-11-09 LAB — GLUCOSE, CAPILLARY
Glucose-Capillary: 270 mg/dL — ABNORMAL HIGH (ref 70–99)
Glucose-Capillary: 328 mg/dL — ABNORMAL HIGH (ref 70–99)
Glucose-Capillary: 338 mg/dL — ABNORMAL HIGH (ref 70–99)
Glucose-Capillary: 135 mg/dL — ABNORMAL HIGH (ref 70–99)
Glucose-Capillary: 86 mg/dL (ref 70–99)
Glucose-Capillary: 106 mg/dL — ABNORMAL HIGH (ref 70–99)

## 2023-11-09 LAB — CBC WITH DIFFERENTIAL/PLATELET
Abs Immature Granulocytes: 0 10*3/uL (ref 0.00–0.07)
Basophils Absolute: 0 10*3/uL (ref 0.0–0.1)
Basophils Relative: 0 %
Eosinophils Absolute: 0.5 10*3/uL (ref 0.0–0.5)
Eosinophils Relative: 2 %
HCT: 26.8 % — ABNORMAL LOW (ref 36.0–46.0)
Hemoglobin: 8.7 g/dL — ABNORMAL LOW (ref 12.0–15.0)
Lymphocytes Relative: 6 %
Lymphs Abs: 1.5 10*3/uL (ref 0.7–4.0)
MCH: 26.5 pg (ref 26.0–34.0)
MCHC: 32.5 g/dL (ref 30.0–36.0)
MCV: 81.7 fL (ref 80.0–100.0)
Monocytes Absolute: 0.7 10*3/uL (ref 0.1–1.0)
Monocytes Relative: 3 %
Neutro Abs: 22 10*3/uL — ABNORMAL HIGH (ref 1.7–7.7)
Neutrophils Relative %: 89 %
Platelets: 300 10*3/uL (ref 150–400)
RBC: 3.28 MIL/uL — ABNORMAL LOW (ref 3.87–5.11)
RDW: 16 % — ABNORMAL HIGH (ref 11.5–15.5)
WBC: 24.7 10*3/uL — ABNORMAL HIGH (ref 4.0–10.5)
nRBC: 0 % (ref 0.0–0.2)
nRBC: 0 /100{WBCs}

## 2023-11-09 LAB — C-REACTIVE PROTEIN: CRP: 13.7 mg/dL — ABNORMAL HIGH (ref <1.0)

## 2023-11-09 LAB — PHOSPHORUS: Phosphorus: 2.6 mg/dL (ref 2.5–4.6)

## 2023-11-09 LAB — PROCALCITONIN: Procalcitonin: 16.2 ng/mL

## 2023-11-09 LAB — MAGNESIUM: Magnesium: 1.8 mg/dL (ref 1.7–2.4)

## 2023-11-09 LAB — HEPARIN LEVEL (UNFRACTIONATED): Heparin Unfractionated: 0.52 [IU]/mL (ref 0.30–0.70)

## 2023-11-09 MED ORDER — INSULIN GLARGINE-YFGN 100 UNIT/ML ~~LOC~~ SOLN: 10.0000 [IU] | Freq: Every day | SUBCUTANEOUS | Status: DC

## 2023-11-09 MED ORDER — CARVEDILOL 3.125 MG PO TABS
3.1250 mg | ORAL_TABLET | Freq: Two times a day (BID) | ORAL | Status: DC
  Administered 2023-11-09 – 2023-11-10 (×4): 3.125 mg via ORAL
  Filled 2023-11-09 (×5): qty 1

## 2023-11-09 MED ORDER — HYDROMORPHONE HCL 1 MG/ML IJ SOLN
0.5000 mg | INTRAMUSCULAR | Status: DC | PRN
  Administered 2023-11-10 – 2023-11-14 (×14): 0.5 mg via INTRAVENOUS
  Filled 2023-11-09 (×15): qty 0.5

## 2023-11-09 MED ORDER — INSULIN GLARGINE-YFGN 100 UNIT/ML ~~LOC~~ SOLN
12.0000 [IU] | Freq: Every day | SUBCUTANEOUS | Status: DC
  Administered 2023-11-09: 12 [IU] via SUBCUTANEOUS
  Filled 2023-11-09: qty 0.12

## 2023-11-09 MED ORDER — AMLODIPINE BESYLATE 10 MG PO TABS: 10.0000 mg | ORAL_TABLET | Freq: Every day | ORAL | Status: DC

## 2023-11-09 MED ORDER — TRACE MINERALS CU-MN-SE-ZN 300-55-60-3000 MCG/ML IV SOLN
INTRAVENOUS | Status: AC
  Filled 2023-11-09: qty 384

## 2023-11-09 MED ORDER — INSULIN ASPART 100 UNIT/ML IJ SOLN
0.0000 [IU] | INTRAMUSCULAR | Status: DC
  Administered 2023-11-09: 3 [IU] via SUBCUTANEOUS
  Administered 2023-11-09: 15 [IU] via SUBCUTANEOUS
  Administered 2023-11-10: 3 [IU] via SUBCUTANEOUS
  Administered 2023-11-10: 4 [IU] via SUBCUTANEOUS
  Administered 2023-11-10 – 2023-11-11 (×2): 3 [IU] via SUBCUTANEOUS
  Administered 2023-11-11: 4 [IU] via SUBCUTANEOUS

## 2023-11-09 MED ORDER — POTASSIUM CHLORIDE 20 MEQ PO PACK
20.0000 meq | PACK | Freq: Once | ORAL | Status: AC
  Administered 2023-11-09: 20 meq via ORAL
  Filled 2023-11-09: qty 1

## 2023-11-09 NOTE — Progress Notes (Signed)
Patient ID: [name], female   DOB: January 22, 1956, 68 y.o.   MRN: [id number]  [facility] Progress Note   Assessment/ Plan:   1.  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  After initial refusal of wanting to pursue renal replacement therapy, she changed her mind and asked to start dialysis with the intention of undergoing surgery for contained perforation of acute diverticulitis.   - #2 HD 2/12.HD #3 11/08/23 - has non tunneled HD cath in for 6 days-  will need to be converted to [facility]  if she still wants to continue-  has not been possible due to elevated WBC - agree with pall care conversations- unclear if she would want to continue long-term dialysis- greatly appreciate pall care 2.  Acute diverticulitis with contained perforation of descending colon: On Zosyn and now status post exploratory laparotomy with Hartman's resection for perforated diverticulitis/peritonitis yesterday (2/8).  Surgical note reviewed from today along with wound assessment. - going to get TNA-   currently going thru her vascath-  I respectfully request another line to give TNA just so we done have issues with the vascath  3.  Anion gap metabolic acidosis: Likely secondary to combination of acute kidney injury and ongoing infection, improved with dialysis. 4.  Anemia: Likely secondary to chronic illness, iron stores significantly low and yesterday started on intravenous iron for loading prior to starting ESA.  She is also status post PRBC transfusion- overall better  Subjective:    Sitting in bed, conversing with pall care   Objective:   BP (!) 172/141 (BP Location: Right Arm)   Pulse 78   Temp (!) 97.5 F (36.4 C) (Oral)   Resp 19   Ht 5' 7.01" (1.702 m)   Wt 98.2 kg   SpO2 100%   BMI 33.90 kg/m   Intake/Output Summary (Last 24 hours) at 11/09/2023 1102 Last  data filed at 11/09/2023 0800 Gross per 24 hour  Intake 2086.36 ml  Output 1560 ml  Net 526.36 ml   Weight change:   Physical Exam: Gen: Uncomfortably resting in bed, NGT in place with LIS and bilious drainage CVS: Pulse regular rhythm, normal rate, S1 and S2 normal Resp: Clear to auscultation bilaterally with diminished breath sounds over bases, no rales Abd: Protuberant with colostomy bag in situ.  (Wound assessment noted on surgical note) Ext: Trace ankle edema.  No asterixis/myoclonic jerks noted  Imaging: IR Fluoro Guide CV Line Right Result Date: 11/08/2023 INDICATION: ESRD.  IV antibiotics. EXAM: ULTRASOUND AND FLUOROSCOPIC GUIDED PLACEMENT OF TUNNELED CENTRAL VENOUS "Powerline" CATHETER MEDICATIONS: The patient was on scheduled IV antibiotics. ANESTHESIA/SEDATION: Local anesthetic was administered. The patient was continuously monitored during the procedure by the interventional radiology nurse under my direct supervision. FLUOROSCOPY TIME:  Fluoroscopic dose; 3.8 mGy COMPLICATIONS: None immediate. PROCEDURE: Informed written consent was obtained from the patient and/or patient's representative after a discussion of the risks, benefits, and alternatives to treatment. Questions regarding the procedure were encouraged and answered. The LEFT neck and chest were prepped with chlorhexidine in a sterile fashion, and a sterile drape was applied covering the operative field. Maximum barrier sterile technique with sterile gowns and gloves were used for the procedure. A timeout was performed prior to the initiation of the procedure. After the overlying soft tissues were anesthetized, a small venotomy incision was created and a micropuncture kit was utilized to access the internal jugular vein.  Real-time ultrasound guidance was utilized for vascular access including the acquisition of a permanent ultrasound image documenting patency of the accessed vessel. The microwire was utilized to measure  appropriate catheter length. The micropuncture sheath was exchanged for a peel-away sheath over a guidewire. A 5 Fr dual lumen tunneled central venous catheter measuring 33 cm was tunneled in a retrograde fashion from the anterior chest wall to the venotomy incision. The catheter was then placed through the peel-away sheath with tip ultimately positioned at the superior caval-atrial junction. Final catheter positioning was confirmed and documented with a spot radiographic image. The catheter aspirates and flushes normally. The catheter was flushed with appropriate volume heparin dwells. The catheter exit site was secured with a 3-0 Ethilon retention suture. The venotomy incision was closed with Dermabond. Dressings were applied. The patient tolerated the procedure well without immediate post procedural complication. FINDINGS: After catheter placement, the tip lies within the superior apect of the right atrium. The catheter aspirates and flushes normally and is ready for immediate use. IMPRESSION: Successful placement of 33 cm dual lumen tunneled "Powerline" central venous catheter via the LEFT internal jugular vein The tip of the catheter is positioned within the proximal RIGHT atrium. The catheter is ready for immediate use. Roanna Banning, MD [facility] Radiology Electronically Signed   By: Roanna Banning M.D.   On: 11/08/2023 18:10   IR US Guide Vasc Access Left Result Date: 11/08/2023 INDICATION: ESRD.  IV antibiotics. EXAM: ULTRASOUND AND FLUOROSCOPIC GUIDED PLACEMENT OF TUNNELED CENTRAL VENOUS "Powerline" CATHETER MEDICATIONS: The patient was on scheduled IV antibiotics. ANESTHESIA/SEDATION: Local anesthetic was administered. The patient was continuously monitored during the procedure by the interventional radiology nurse under my direct supervision. FLUOROSCOPY TIME:  Fluoroscopic dose; 3.8 mGy COMPLICATIONS: None immediate. PROCEDURE: Informed written consent  was obtained from the patient and/or patient's representative after a discussion of the risks, benefits, and alternatives to treatment. Questions regarding the procedure were encouraged and answered. The LEFT neck and chest were prepped with chlorhexidine in a sterile fashion, and a sterile drape was applied covering the operative field. Maximum barrier sterile technique with sterile gowns and gloves were used for the procedure. A timeout was performed prior to the initiation of the procedure. After the overlying soft tissues were anesthetized, a small venotomy incision was created and a micropuncture kit was utilized to access the internal jugular vein. Real-time ultrasound guidance was utilized for vascular access including the acquisition of a permanent ultrasound image documenting patency of the accessed vessel. The microwire was utilized to measure appropriate catheter length. The micropuncture sheath was exchanged for a peel-away sheath over a guidewire. A 5 Fr dual lumen tunneled central venous catheter measuring 33 cm was tunneled in a retrograde fashion from the anterior chest wall to the venotomy incision. The catheter was then placed through the peel-away sheath with tip ultimately positioned at the superior caval-atrial junction. Final catheter positioning was confirmed and documented with a spot radiographic image. The catheter aspirates and flushes normally. The catheter was flushed with appropriate volume heparin dwells. The catheter exit site was secured with a 3-0 Ethilon retention suture. The venotomy incision was closed with Dermabond. Dressings were applied. The patient tolerated the procedure well without immediate post procedural complication. FINDINGS: After catheter placement, the tip lies within the superior apect of the right atrium. The catheter aspirates and flushes normally and is ready for immediate use. IMPRESSION: Successful placement of 33 cm dual lumen tunneled "Powerline" central  venous catheter  via the LEFT internal jugular vein The tip of the catheter is positioned within the proximal RIGHT atrium. The catheter is ready for immediate use. Roanna Banning, MD [facility] Radiology Electronically Signed   By: Roanna Banning M.D.   On: 11/08/2023 18:10   VAS [place] LOWER EXTREMITY VENOUS (DVT) Result Date: 11/07/2023  Lower Venous DVT Study Patient Name:  GEISHA ABERNATHY [facility]  Date of Exam:   11/07/2023 Medical Rec #: [id number]          Accession #:    [id number] Date of Birth: 27-Jan-1956          Patient Gender: F Patient Age:   48 years Exam [facility]  [facility] Procedure:      VAS [place] LOWER EXTREMITY VENOUS (DVT) Referring Phys: Tresa Endo [place]BORNE --------------------------------------------------------------------------------  Indications: Pain, stroke, and A-fib, anemia.  Comparison Study: No prior exam. Performing Technologist: Fernande Bras  Examination Guidelines: A complete evaluation includes B-mode imaging, spectral Doppler, color Doppler, and power Doppler as needed of all accessible portions of each vessel. Bilateral testing is considered an integral part of a complete examination. Limited examinations for reoccurring indications may be performed as noted. The reflux portion of the exam is performed with the patient in reverse Trendelenburg.  +---------+---------------+---------+-----------+----------+--------------+ RIGHT    CompressibilityPhasicitySpontaneityPropertiesThrombus Aging +---------+---------------+---------+-----------+----------+--------------+ CFV      Full           Yes      Yes                                 +---------+---------------+---------+-----------+----------+--------------+ SFJ      Full           Yes      Yes                                 +---------+---------------+---------+-----------+----------+--------------+ FV Prox  Full                                                         +---------+---------------+---------+-----------+----------+--------------+ FV Mid   Full                                                        +---------+---------------+---------+-----------+----------+--------------+ FV DistalFull                                                        +---------+---------------+---------+-----------+----------+--------------+ PFV      Full                                                        +---------+---------------+---------+-----------+----------+--------------+ POP      Full  Yes      Yes                                 +---------+---------------+---------+-----------+----------+--------------+ PTV      Full                                                        +---------+---------------+---------+-----------+----------+--------------+ PERO     Full                                                        +---------+---------------+---------+-----------+----------+--------------+   +---------+---------------+---------+-----------+----------+--------------+ LEFT     CompressibilityPhasicitySpontaneityPropertiesThrombus Aging +---------+---------------+---------+-----------+----------+--------------+ CFV      Full           Yes      Yes                                 +---------+---------------+---------+-----------+----------+--------------+ SFJ      Full           Yes      Yes                                 +---------+---------------+---------+-----------+----------+--------------+ FV Prox  Full                                                        +---------+---------------+---------+-----------+----------+--------------+ FV Mid   Full                                                        +---------+---------------+---------+-----------+----------+--------------+ FV DistalFull                                                         +---------+---------------+---------+-----------+----------+--------------+ PFV      Full                                                        +---------+---------------+---------+-----------+----------+--------------+ POP      Full           Yes      Yes                                 +---------+---------------+---------+-----------+----------+--------------+ PTV      Full                                                        +---------+---------------+---------+-----------+----------+--------------+  PERO     Full                                                        +---------+---------------+---------+-----------+----------+--------------+     Summary: BILATERAL: - No evidence of deep vein thrombosis seen in the lower extremities, bilaterally. -No evidence of popliteal cyst, bilaterally.   *See table(s) above for measurements and observations. Electronically signed by Heath Lark on 11/07/2023 at 4:05:14 PM.    Final      Labs: BMET Recent Labs  Lab 11/03/23 0310 11/04/23 1455 11/05/23 0247 11/06/23 0345 11/07/23 0319 11/07/23 1431 11/08/23 0620 11/09/23 0958  NA 133* 136 135 134* 136 133* 134* 133*  K 4.0 3.9 3.9 3.8 3.9 4.1 3.1* 3.4*  CL 95* 96* 94* 93* 96* 97* 95* 95*  CO2 19* 20* 21* 21* 20* 18* 22 25  GLUCOSE 193* 224* 194* 254* 200* 216* 344* 358*  BUN 65* 81* 47* 62* 38* 45* 55* 38*  CREATININE 7.34* 9.18* 6.10* 7.45* 5.48* 6.34* 7.14* 5.43*  CALCIUM 7.5* 7.4* 7.7* 7.5* 7.6* 7.6* 7.4* 7.3*  PHOS 6.5* 7.0*  --   --   --  5.5* 4.6 2.6   CBC Recent Labs  Lab 11/06/23 0345 11/07/23 0319 11/08/23 0620 11/09/23 0958  WBC 26.8* 31.3* 27.8* 24.7*  NEUTROABS 23.6* 28.2* 22.9* PENDING  HGB 9.2* 9.3* 9.2* 8.7*  HCT 27.9* 28.3* 28.2* 26.8*  MCV 79.7* 80.9 80.8 81.7  PLT 309 320 315 300    Medications:     sodium chloride   Intravenous Once   sodium chloride   Intravenous Once   carvedilol  3.125 mg Oral BID WC   Chlorhexidine Gluconate  Cloth  6 each Topical Q0600   erythromycin  400 mg Oral Q6H   insulin aspart  0-15 Units Subcutaneous Q4H   insulin glargine-yfgn  12 Units Subcutaneous Daily   lidocaine  20 mL Intradermal Once   mouth rinse  15 mL Mouth Rinse 4 times per day   pantoprazole (PROTONIX) IV  40 mg Intravenous Q12H   potassium chloride  20 mEq Oral Once   sodium chloride flush  10-40 mL Intracatheter Q12H   Bilbo Carcamo  11/09/2023, 11:02 AM

## 2023-11-09 NOTE — Progress Notes (Signed)
PHARMACY - TOTAL PARENTERAL NUTRITION CONSULT NOTE   Indication: Prolonged ileus  Patient Measurements: Height: 5' 7.01" (170.2 cm) Weight: 98.2 kg (216 lb 7.9 oz) IBW/kg (Calculated) : 61.62 TPN AdjBW (KG): 71.3 Body mass index is 33.9 kg/m. Usual Weight: 102.9 kg  Assessment: 55 YOF who is s/p Hartmann's for sigmoid diverticulitis w/ perforation with prolonged ileus requiring TPN therapy. 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 HgbA1c 6.6 on 10/28/2023 -receiving 12u semglee on 2/15 AM Electrolytes: Na 133, K 3.4 (s/p KCL x2), Cl 95, Ca 7.4 [CoCa 8.9], Mag 1.8, Phos 2.6 Renal: iHD - sched for 2/14  Hepatic: ALT/AST/ALP WNL, Tbili 1.6, Alb 2.1 Intake / Output; MIVF: NG 150 mL, Colostomy 0  mL charted GI Imaging: 2/12 CT abd shows 1. Interval colostomy with fluid collection along the left gutter,dominant pocket measuring 10 x 6 x 5 cm. 2. No bowel obstruction GI Surgeries / Procedures:  2/8-  POD 5, s/p Hartmann's for Sigmoid diverticulitis with perforation   Central access: 11/07/2023 TPN start date: 11/07/2023  Nutritional Goals: Goal TPN rate is 85 mL/hr (provides 122 g of protein and 2400 kcals per day)  RD Assessment: Estimated Needs Total Energy Estimated Needs: 2250-2450 Total Protein Estimated Needs: 120-135 grams Total Fluid Estimated Needs: 1000 ml + UOP  Current Nutrition:  NPO and TPN  Plan:  IV Team needs to be consulted DAILY to draw labs for this patient  Continue TPN at 22mL/hr at 1800 will provide 58 g protein and 1131 Kcal for today given continued CBG elevations  Electrolytes in TPN: Na 95 mEq/L, K 35 mEq/L, Ca 29mEq/L, Mg 49mEq/L, and Phos 70mmol/L. Cl:Ac maximize chloride; increase to 35u insulin regular to the TPN  Continue standard MVI and trace elements to TPN Continue thiamine 100 mg IV in TPN x7 days  (D2) Intensify to Resistant q4h SSI and adjust as needed  KCL PO x1 today  Monitor TPN labs on Mon/Thurs, and PRN  BMP, Mag, Phos tomorrow AM   Calton Dach, PharmD, BCCCP Clinical Pharmacist 11/09/2023 9:02 AM

## 2023-11-09 NOTE — Plan of Care (Signed)
   Problem: Education: Goal: Ability to describe self-care measures that may prevent or decrease complications (Diabetes Survival Skills Education) will improve Outcome: Progressing

## 2023-11-09 NOTE — Progress Notes (Signed)
PROGRESS NOTE        PATIENT DETAILS Name: [name] Age: 68 y.o. Sex: female Date of Birth: 1955-10-15 Admit Date: 10/27/2023 Admitting Physician Dolly Rias, MD ZOX:[id number], Devonne Doughty, NP  Brief Summary: Patient is a 68 y.o.  female with history of CKD 5, A-fib on Eliquis, CAD-who presented with abdominal pain-found to have acute diverticulitis with contained perforation involving the descending colon.  Hospital course complicated by worsening AKI-thought to have progressed to ESRD-requiring initiation of HD.  See below for further details.  Significant events: 2/2>> admit to [facility] 2/6>> CT abdomen worsening diverticulitis with perforation 2/7>> transitioned to full comfort measures-as with worsening AKI-however multiple family members arrived-comfort care revoked-now full code-full scope of treatment including HD/laparotomy with colostomy.  General surgery/nephrology reconsulted.  IR placed [facility] and patient underwent first hemodialysis.  2/8>> remains n.p.o-colectomy/colostomy later today.  Significant studies: 2/2>> CT abdomen/pelvis: Acute diverticulitis-descending colon with contained perforation 2/6>> CT abdomen/pelvis: Worsening diverticulitis of descending colon with perforation and increasing failure-suspected oral contrast extravasation left abdomen.  Significant microbiology data: 2/2>> COVID/influenza/RSV PCR: Negative  Procedures: 2/8>> by Dr. Freida Busman underwent exploratory laparotomy with Hartman's resection for perforated diverticulitis with diffuse peritonitis, colostomy in place. 2/13 - R IJ Trialysis catheter - IR 2/14 -left IJ Central line placed by IR for TNA  Consults: Surgery Renal Palliative IR  Subjective:  Patient in bed, appears comfortable, denies any headache, no fever, no chest pain or pressure, no shortness of breath , no abdominal pain. No new focal weakness.  Objective: Vitals: Blood pressure (!) 172/141, pulse 78,  temperature (!) 97.5 F (36.4 C), temperature source Oral, resp. rate 19, height 5' 7.01" (1.702 m), weight 98.2 kg, SpO2 100%.   Exam:  Awake Alert, No new F.N deficits, dark bilious secretion, colostomy bag in place, right IJ HD catheter,   .AT,[name], [name],   Symmetrical Chest wall movement, Good air movement bilaterally, CTAB RRR,No Gallops, Rubs or new Murmurs,  +ve B.Sounds, Abd Soft, No tenderness,   No Cyanosis, Clubbing or edema    Assessment/Plan:  Acute diverticulitis with contained perforation of the descending colon - Initially was managed conservatively, subsequently 2/8>> by Dr. Freida Busman general surgery she underwent exploratory laparotomy with Hartman's resection for perforated diverticulitis with diffuse peritonitis, colostomy in place, continue Zosyn, continue NG tube, defer further management of this issue to General surgery Case discussed with general surgery team on 11/05/2023, monitor closely on present treatment, does have rising leukocytosis but afebrile, repeat CT noted, case discussed with general surgery for now wait and watch and monitor.  An IV TNA started on 11/07/2023 evening, NG tube discontinued by general surgery on 11/09/2023, trial of clear liquids and monitor.   AKI on CKD 5 with progression to ESRD AKI likely hemodynamically mediated-has now likely progressed to ESRD.  HD initiated via right IJ temporary catheter, switched to right IJ trialysis catheter by IR on 11/07/2023.  CAD Currently no anginal symptoms Suspect not on antiplatelets as on anticoagulation with Eliquis/heparin Continue beta-blocker On Repatha injections as an outpatient.  PAF Sinus rhythm Continue Coreg Currently on heparin drip with caution.  Anemia of acute illness  Brought on by acute illness, underlying renal insufficiency now developing to ESRD, surgical blood loss, gradually trending down, 2 units of packed RBC on 11/04/2023, on IV PPI. CBC looks stable on  11/05/2023 will continue  to monitor closely.  HTN BP stable-placed on Coreg/amlodipine.  DM-2 (A1c 6.6 on 2/3) On TNA and now on clear liquids, every 4 moderate sliding scale, low-dose Semglee added on 11/09/2023.  Monitor with caution.  Recent Labs    11/08/23 2245 11/09/23 0309 11/09/23 0859  GLUCAP 261* 270* 328*    Palliative care/goals of care Initially DNR-and wanted to do comfort care, now is full code wants to undergo aggressive treatment including surgery and dialysis.  Full code now.  Class 1 Obesity: Estimated body mass index is 33.9 kg/m as calculated from the following:   Height as of this encounter: 5' 7.01" (1.702 m).   Weight as of this encounter: 98.2 kg.    Code status:   Code Status: Limited: Do not attempt resuscitation (DNR) -DNR-LIMITED -Do Not Intubate/DNI    DVT Prophylaxis: Place and maintain sequential compression device Start: 11/06/23 0613 IV Heparin  Family Communication:  Verlene Mayer updated 2/7  Disposition Plan: Status is: Inpatient Remains inpatient appropriate because: Severity of illness   Planned Discharge Destination:Residential hospice   Diet: Diet Order             Diet full liquid Fluid consistency: Thin  Diet effective now                   MEDICATIONS: Scheduled Meds:  sodium chloride   Intravenous Once   sodium chloride   Intravenous Once   amLODipine  10 mg Oral Daily   Chlorhexidine Gluconate Cloth  6 each Topical Q0600   erythromycin  400 mg Oral Q6H   insulin aspart  0-15 Units Subcutaneous Q4H   lidocaine  20 mL Intradermal Once   mouth rinse  15 mL Mouth Rinse 4 times per day   pantoprazole (PROTONIX) IV  40 mg Intravenous Q12H   sodium chloride flush  10-40 mL Intracatheter Q12H   Continuous Infusions:  heparin 1,800 Units/hr (11/09/23 0712)   piperacillin-tazobactam (ZOSYN)  IV 2.25 g (11/09/23 0530)   TPN ADULT (ION) 40 mL/hr at 11/09/23 0300   PRN Meds:.acetaminophen **OR**  acetaminophen, albuterol, hydrALAZINE, HYDROmorphone (DILAUDID) injection, menthol-cetylpyridinium, ondansetron (ZOFRAN) IV, mouth rinse   I have personally reviewed following labs and imaging studies  LABORATORY DATA:   Data Review:    Recent Labs  Lab 11/04/23 0209 11/05/23 0247 11/06/23 0345 11/07/23 0319 11/08/23 0620  WBC 19.0* 24.7* 26.8* 31.3* 27.8*  HGB 6.9* 9.1* 9.2* 9.3* 9.2*  HCT 20.7* 27.4* 27.9* 28.3* 28.2*  PLT 317 316 309 320 315  MCV 77.8* 79.9* 79.7* 80.9 80.8  MCH 25.9* 26.5 26.3 26.6 26.4  MCHC 33.3 33.2 33.0 32.9 32.6  RDW 14.6 15.4 15.8* 16.0* 15.9*  LYMPHSABS 0.9 1.1 0.5* 0.9 0.8  MONOABS 1.9* 2.1* 2.4* 1.9* 2.0*  EOSABS 0.1 0.1 0.3 0.3 0.4  BASOSABS 0.1 0.1 0.0 0.0 0.2*    Recent Labs  Lab 11/03/23 0310 11/04/23 0209 11/04/23 1455 11/05/23 0247 11/06/23 0345 11/07/23 0319 11/07/23 1431 11/08/23 0620  NA 133*  --  136 135 134* 136 133* 134*  K 4.0  --  3.9 3.9 3.8 3.9 4.1 3.1*  CL 95*  --  96* 94* 93* 96* 97* 95*  CO2 19*  --  20* 21* 21* 20* 18* 22  ANIONGAP 19*  --  22* 20* 20* 20* 18* 17*  GLUCOSE 193*  --  224* 194* 254* 200* 216* 344*  BUN 65*  --  81* 47* 62* 38* 45* 55*  CREATININE 7.34*  --  9.18* 6.10* 7.45* 5.48* 6.34* 7.14*  AST  --   --   --   --   --   --  16  --   ALT  --   --   --   --   --   --  14  --   ALKPHOS  --   --   --   --   --   --  47  --   BILITOT  --   --   --   --   --   --  1.6*  --   ALBUMIN 2.1*  --  1.8*  --   --   --  2.1*  --   CRP 22.7* 24.8*  --  23.5* 19.3* 17.8*  --  16.2*  PROCALCITON 41.53 42.39  --  30.19 25.65 19.62  --  17.70  TSH 1.046  --   --   --   --   --   --   --   BNP 78.5 74.4  --  179.0* 244.4*  --   --   --   MG 2.1 2.3  --  2.1 2.3 2.1  --  2.1  PHOS 6.5*  --  7.0*  --   --   --  5.5* 4.6  CALCIUM 7.5*  --  7.4* 7.7* 7.5* 7.6* 7.6* 7.4*      Recent Labs  Lab 11/03/23 0310 11/04/23 0209 11/04/23 1455 11/05/23 0247 11/06/23 0345 11/07/23 0319 11/07/23 1431 11/08/23 0620   CRP 22.7* 24.8*  --  23.5* 19.3* 17.8*  --  16.2*  PROCALCITON 41.53 42.39  --  30.19 25.65 19.62  --  17.70  TSH 1.046  --   --   --   --   --   --   --   BNP 78.5 74.4  --  179.0* 244.4*  --   --   --   MG 2.1 2.3  --  2.1 2.3 2.1  --  2.1  CALCIUM 7.5*  --    < > 7.7* 7.5* 7.6* 7.6* 7.4*   < > = values in this interval not displayed.    --------------------------------------------------------------------------------------------------------------- Lab Results  Component Value Date   CHOL 134 12/21/2019   HDL 49 12/21/2019   LDLCALC 69 12/21/2019   TRIG 82 12/21/2019   CHOLHDL 2.7 12/21/2019    Lab Results  Component Value Date   HGBA1C 6.6 (H) 10/28/2023   No results for input(s): "TSH", "T4TOTAL", "FREET4", "T3FREE", "THYROIDAB" in the last 72 hours.  No results for input(s): "VITAMINB12", "FOLATE", "FERRITIN", "TIBC", "IRON", "RETICCTPCT" in the last 72 hours. ------------------------------------------------------------------------------------------------------------------ Cardiac Enzymes No results for input(s): "CKMB", "TROPONINI", "MYOGLOBIN" in the last 168 hours.  Invalid input(s): "CK"  Micro Results No results found for this or any previous visit (from the past 240 hours).   Radiology Reports IR Fluoro Guide CV Line Right Result Date: 11/08/2023 INDICATION: ESRD.  IV antibiotics. EXAM: ULTRASOUND AND FLUOROSCOPIC GUIDED PLACEMENT OF TUNNELED CENTRAL VENOUS "Powerline" CATHETER MEDICATIONS: The patient was on scheduled IV antibiotics. ANESTHESIA/SEDATION: Local anesthetic was administered. The patient was continuously monitored during the procedure by the interventional radiology nurse under my direct supervision. FLUOROSCOPY TIME:  Fluoroscopic dose; 3.8 mGy COMPLICATIONS: None immediate. PROCEDURE: Informed written consent was obtained from the patient and/or patient's representative after a discussion of the risks, benefits, and alternatives to treatment.  Questions regarding the procedure were encouraged and answered. The LEFT neck and chest were prepped with  chlorhexidine in a sterile fashion, and a sterile drape was applied covering the operative field. Maximum barrier sterile technique with sterile gowns and gloves were used for the procedure. A timeout was performed prior to the initiation of the procedure. After the overlying soft tissues were anesthetized, a small venotomy incision was created and a micropuncture kit was utilized to access the internal jugular vein. Real-time ultrasound guidance was utilized for vascular access including the acquisition of a permanent ultrasound image documenting patency of the accessed vessel. The microwire was utilized to measure appropriate catheter length. The micropuncture sheath was exchanged for a peel-away sheath over a guidewire. A 5 Fr dual lumen tunneled central venous catheter measuring 33 cm was tunneled in a retrograde fashion from the anterior chest wall to the venotomy incision. The catheter was then placed through the peel-away sheath with tip ultimately positioned at the superior caval-atrial junction. Final catheter positioning was confirmed and documented with a spot radiographic image. The catheter aspirates and flushes normally. The catheter was flushed with appropriate volume heparin dwells. The catheter exit site was secured with a 3-0 Ethilon retention suture. The venotomy incision was closed with Dermabond. Dressings were applied. The patient tolerated the procedure well without immediate post procedural complication. FINDINGS: After catheter placement, the tip lies within the superior apect of the right atrium. The catheter aspirates and flushes normally and is ready for immediate use. IMPRESSION: Successful placement of 33 cm dual lumen tunneled "Powerline" central venous catheter via the LEFT internal jugular vein The tip of the catheter is positioned within the proximal RIGHT atrium. The catheter  is ready for immediate use. Roanna Banning, MD [facility] Radiology Electronically Signed   By: Roanna Banning M.D.   On: 11/08/2023 18:10   IR US Guide Vasc Access Left Result Date: 11/08/2023 INDICATION: ESRD.  IV antibiotics. EXAM: ULTRASOUND AND FLUOROSCOPIC GUIDED PLACEMENT OF TUNNELED CENTRAL VENOUS "Powerline" CATHETER MEDICATIONS: The patient was on scheduled IV antibiotics. ANESTHESIA/SEDATION: Local anesthetic was administered. The patient was continuously monitored during the procedure by the interventional radiology nurse under my direct supervision. FLUOROSCOPY TIME:  Fluoroscopic dose; 3.8 mGy COMPLICATIONS: None immediate. PROCEDURE: Informed written consent was obtained from the patient and/or patient's representative after a discussion of the risks, benefits, and alternatives to treatment. Questions regarding the procedure were encouraged and answered. The LEFT neck and chest were prepped with chlorhexidine in a sterile fashion, and a sterile drape was applied covering the operative field. Maximum barrier sterile technique with sterile gowns and gloves were used for the procedure. A timeout was performed prior to the initiation of the procedure. After the overlying soft tissues were anesthetized, a small venotomy incision was created and a micropuncture kit was utilized to access the internal jugular vein. Real-time ultrasound guidance was utilized for vascular access including the acquisition of a permanent ultrasound image documenting patency of the accessed vessel. The microwire was utilized to measure appropriate catheter length. The micropuncture sheath was exchanged for a peel-away sheath over a guidewire. A 5 Fr dual lumen tunneled central venous catheter measuring 33 cm was tunneled in a retrograde fashion from the anterior chest wall to the venotomy incision. The catheter was then placed through the peel-away sheath with tip ultimately  positioned at the superior caval-atrial junction. Final catheter positioning was confirmed and documented with a spot radiographic image. The catheter aspirates and flushes normally. The catheter was flushed with appropriate volume heparin dwells. The catheter exit site was secured with a 3-0  Ethilon retention suture. The venotomy incision was closed with Dermabond. Dressings were applied. The patient tolerated the procedure well without immediate post procedural complication. FINDINGS: After catheter placement, the tip lies within the superior apect of the right atrium. The catheter aspirates and flushes normally and is ready for immediate use. IMPRESSION: Successful placement of 33 cm dual lumen tunneled "Powerline" central venous catheter via the LEFT internal jugular vein The tip of the catheter is positioned within the proximal RIGHT atrium. The catheter is ready for immediate use. Roanna Banning, MD [facility] Radiology Electronically Signed   By: Roanna Banning M.D.   On: 11/08/2023 18:10   VAS [place] LOWER EXTREMITY VENOUS (DVT) Result Date: 11/07/2023  Lower Venous DVT Study Patient Name:  ALLIAH BOULANGER [facility]  Date of Exam:   11/07/2023 Medical Rec #: [id number]          Accession #:    [id number] Date of Birth: [birth year]          Patient Gender: F Patient Age:   63 years Exam Location:  [facility] Procedure:      VAS [place] LOWER EXTREMITY VENOUS (DVT) Referring Phys: Tresa Endo OSBORNE --------------------------------------------------------------------------------  Indications: Pain, stroke, and A-fib, anemia.  Comparison Study: No prior exam. Performing Technologist: Fernande Bras  Examination Guidelines: A complete evaluation includes B-mode imaging, spectral Doppler, color Doppler, and power Doppler as needed of all accessible portions of each vessel. Bilateral testing is considered an integral part of a complete examination. Limited examinations for  reoccurring indications may be performed as noted. The reflux portion of the exam is performed with the patient in reverse Trendelenburg.  +---------+---------------+---------+-----------+----------+--------------+ RIGHT    CompressibilityPhasicitySpontaneityPropertiesThrombus Aging +---------+---------------+---------+-----------+----------+--------------+ CFV      Full           Yes      Yes                                 +---------+---------------+---------+-----------+----------+--------------+ SFJ      Full           Yes      Yes                                 +---------+---------------+---------+-----------+----------+--------------+ FV Prox  Full                                                        +---------+---------------+---------+-----------+----------+--------------+ FV Mid   Full                                                        +---------+---------------+---------+-----------+----------+--------------+ FV DistalFull                                                        +---------+---------------+---------+-----------+----------+--------------+ PFV      Full                                                        +---------+---------------+---------+-----------+----------+--------------+  POP      Full           Yes      Yes                                 +---------+---------------+---------+-----------+----------+--------------+ PTV      Full                                                        +---------+---------------+---------+-----------+----------+--------------+ PERO     Full                                                        +---------+---------------+---------+-----------+----------+--------------+   +---------+---------------+---------+-----------+----------+--------------+ LEFT     CompressibilityPhasicitySpontaneityPropertiesThrombus Aging  +---------+---------------+---------+-----------+----------+--------------+ CFV      Full           Yes      Yes                                 +---------+---------------+---------+-----------+----------+--------------+ SFJ      Full           Yes      Yes                                 +---------+---------------+---------+-----------+----------+--------------+ FV Prox  Full                                                        +---------+---------------+---------+-----------+----------+--------------+ FV Mid   Full                                                        +---------+---------------+---------+-----------+----------+--------------+ FV DistalFull                                                        +---------+---------------+---------+-----------+----------+--------------+ PFV      Full                                                        +---------+---------------+---------+-----------+----------+--------------+ POP      Full           Yes      Yes                                 +---------+---------------+---------+-----------+----------+--------------+  PTV      Full                                                        +---------+---------------+---------+-----------+----------+--------------+ PERO     Full                                                        +---------+---------------+---------+-----------+----------+--------------+     Summary: BILATERAL: - No evidence of deep vein thrombosis seen in the lower extremities, bilaterally. -No evidence of popliteal cyst, bilaterally.   *See table(s) above for measurements and observations. Electronically signed by Heath Lark on 11/07/2023 at 4:05:14 PM.    Final    CT ABDOMEN PELVIS WO CONTRAST Result Date: 11/06/2023 CLINICAL DATA:  Diverticulitis, complication suspected EXAM: CT ABDOMEN AND PELVIS WITHOUT CONTRAST TECHNIQUE: Multidetector CT imaging of the abdomen and pelvis  was performed following the standard protocol without IV contrast. RADIATION DOSE REDUCTION: This exam was performed according to the departmental dose-optimization program which includes automated exposure control, adjustment of the mA and/or kV according to patient size and/or use of iterative reconstruction technique. COMPARISON:  10/31/2023 FINDINGS: Lower chest: Volume loss and opacity in the lower lungs consistent with atelectasis. There is a central line with tip at the upper right atrium. Coronary atherosclerosis. Hepatobiliary: No focal liver abnormality. Calcific in the intermediate density in the gallbladder attributed to stones. No biliary dilatation or pericholecystic edema. Pancreas: Unremarkable. Spleen: Unremarkable. Adrenals/Urinary Tract: Negative adrenals. No hydronephrosis or stone. Renal cortical thinning and lobulation at the left upper pole. 3 mm stone at the upper pole left kidney. The urinary bladder is collapsed around a Foley catheter. Stomach/Bowel: Descending colostomy. Bowel anastomosis in the right abdomen. No evidence of bowel obstruction. Fluid collection with loculation appearance/peritoneal thickening along the left flank measuring up to 5 x 6 x 10 cm. Milder fluid accumulation in the ventral peritoneal space. Vascular/Lymphatic: Extensive atheromatous calcification of the aorta and iliacs. No mass or adenopathy. Reproductive:No pathologic findings. Other: No ascites or pneumoperitoneum. Musculoskeletal: No acute abnormalities.  T9 butterfly vertebra IMPRESSION: 1. Interval colostomy with fluid collection along the left gutter, dominant pocket measuring 10 x 6 x 5 cm. 2. No bowel obstruction. 3. Cholelithiasis and left nephrolithiasis. Electronically Signed   By: Tiburcio Pea M.D.   On: 11/06/2023 07:12   DG Chest Port 1 View Result Date: 11/03/2023 CLINICAL DATA:  Shortness of breath EXAM: PORTABLE CHEST - 1 VIEW COMPARISON:  11/11/2018 FINDINGS: Unchanged mild cardiomegaly  and pulmonary vascular congestion. Interval worsening of left basilar opacity likely due to atelectasis. Lungs otherwise clear. Examination is limited due to expiratory phase of imaging. Interval placement of temporary right IJ hemodialysis catheter which terminates in the region of the right atrium. Nasogastric tube terminates in the left upper quadrant. IMPRESSION: Interval worsening of left basilar opacity which is likely due to atelectasis. Electronically Signed   By: Acquanetta Belling M.D.   On: 11/03/2023 07:22   IR Fluoro Guide CV Line Right Result Date: 11/01/2023 INDICATION: ESRD requiring HD. EXAM: NON-TUNNELED CENTRAL VENOUS HEMODIALYSIS CATHETER PLACEMENT WITH ULTRASOUND AND FLUOROSCOPIC GUIDANCE COMPARISON:  CT AP 10/31/2023. MEDICATIONS: Local anesthetic was  administered. FLUOROSCOPY TIME:  Fluoroscopic dose; 35 mGy COMPLICATIONS: None immediate. PROCEDURE: Informed written consent was obtained from the patient and/or patient's representative after a discussion of the risks, benefits, and alternatives to treatment. Questions regarding the procedure were encouraged and answered. The RIGHT neck and chest were prepped with chlorhexidine in a sterile fashion, and a sterile drape was applied covering the operative field. Maximum barrier sterile technique with sterile gowns and gloves were used for the procedure. A timeout was performed prior to the initiation of the procedure. After the overlying soft tissues were anesthetized, a small venotomy incision was created and a micropuncture kit was utilized to access the external jugular vein. Real-time ultrasound guidance was utilized for vascular access including the acquisition of a permanent ultrasound image documenting patency of the accessed vessel. The microwire was utilized to measure appropriate catheter length. A stiff glidewire was advanced to the level of the IVC. Under fluoroscopic guidance, the venotomy was serially dilated, ultimately allowing  placement of a 20 cm temporary Trialysis catheter with tip ultimately terminating within the superior aspect of the right atrium. Final catheter positioning was confirmed and documented with a spot radiographic image. The catheter aspirates and flushes normally. The catheter was flushed with appropriate volume heparin dwells. The catheter exit site was secured with a 2-0 Ethilon retention suture. A dressing was placed. The patient tolerated the procedure well without immediate post procedural complication. IMPRESSION: Successful placement of a RIGHT EXTERNAL jugular approach 20 cm non-tunneled dialysis catheter The tip of the catheter is positioned within the proximal RIGHT atrium. The catheter is ready for immediate use. PLAN: This catheter may be converted to a tunneled dialysis catheter at a later date as indicated. Roanna Banning, MD [facility] Radiology Electronically Signed   By: Roanna Banning M.D.   On: 11/01/2023 18:15   IR US Guide Vasc Access Right Result Date: 11/01/2023 INDICATION: ESRD requiring HD. EXAM: NON-TUNNELED CENTRAL VENOUS HEMODIALYSIS CATHETER PLACEMENT WITH ULTRASOUND AND FLUOROSCOPIC GUIDANCE COMPARISON:  CT AP 10/31/2023. MEDICATIONS: Local anesthetic was administered. FLUOROSCOPY TIME:  Fluoroscopic dose; 35 mGy COMPLICATIONS: None immediate. PROCEDURE: Informed written consent was obtained from the patient and/or patient's representative after a discussion of the risks, benefits, and alternatives to treatment. Questions regarding the procedure were encouraged and answered. The RIGHT neck and chest were prepped with chlorhexidine in a sterile fashion, and a sterile drape was applied covering the operative field. Maximum barrier sterile technique with sterile gowns and gloves were used for the procedure. A timeout was performed prior to the initiation of the procedure. After the overlying soft tissues were anesthetized, a small venotomy  incision was created and a micropuncture kit was utilized to access the external jugular vein. Real-time ultrasound guidance was utilized for vascular access including the acquisition of a permanent ultrasound image documenting patency of the accessed vessel. The microwire was utilized to measure appropriate catheter length. A stiff glidewire was advanced to the level of the IVC. Under fluoroscopic guidance, the venotomy was serially dilated, ultimately allowing placement of a 20 cm temporary Trialysis catheter with tip ultimately terminating within the superior aspect of the right atrium. Final catheter positioning was confirmed and documented with a spot radiographic image. The catheter aspirates and flushes normally. The catheter was flushed with appropriate volume heparin dwells. The catheter exit site was secured with a 2-0 Ethilon retention suture. A dressing was placed. The patient tolerated the procedure well without immediate post procedural complication. IMPRESSION: Successful placement of a RIGHT  EXTERNAL jugular approach 20 cm non-tunneled dialysis catheter The tip of the catheter is positioned within the proximal RIGHT atrium. The catheter is ready for immediate use. PLAN: This catheter may be converted to a tunneled dialysis catheter at a later date as indicated. Roanna Banning, MD [facility] Radiology Electronically Signed   By: Roanna Banning M.D.   On: 11/01/2023 18:15   CT ABDOMEN PELVIS WO CONTRAST Result Date: 10/31/2023 CLINICAL DATA:  Diverticulitis, complication suspected. EXAM: CT ABDOMEN AND PELVIS WITHOUT CONTRAST TECHNIQUE: Multidetector CT imaging of the abdomen and pelvis was performed following the standard protocol without IV contrast. RADIATION DOSE REDUCTION: This exam was performed according to the departmental dose-optimization program which includes automated exposure control, adjustment of the mA and/or kV according to patient size  and/or use of iterative reconstruction technique. COMPARISON:  10/27/2023. FINDINGS: Lower chest: Heart is enlarged and multi-vessel coronary artery calcifications are noted. Consolidation and patchy airspace disease is present at the lung bases bilaterally. Hepatobiliary: No focal liver abnormality is seen. Stones are present within the gallbladder. No biliary ductal dilatation. Pancreas: Unremarkable. No pancreatic ductal dilatation or surrounding inflammatory changes. Spleen: Normal in size without focal abnormality. Adrenals/Urinary Tract: The adrenal glands are within normal limits. Calcifications are present in the upper pole of the left kidney. There is a cyst in the lower pole of the right kidney. No hydronephrosis bilaterally. The bladder is unremarkable. Stomach/Bowel: The stomach is within normal limits. No bowel obstruction or pneumatosis is seen. Right hemicolectomy changes are noted. There is diffuse colonic wall thickening. Scattered diverticula are present along the colon. A few small foci of free air are noted in the mesentery in the mid left abdomen, slightly increased from the previous exam. There is suggestion of contrast extravasation in the region. There is fine no definite abscess is seen. Vascular/Lymphatic: Aortic atherosclerosis. No enlarged abdominal or pelvic lymph nodes. Reproductive: No acute abnormality. Other: Mild ascites is noted in all 4 quadrants. A fat containing umbilical hernia is noted. There is mild anasarca. Musculoskeletal: The bony structures are stable. IMPRESSION: 1. Worsening diverticulitis of the descending colon with perforation and increasing free air and suspected oral contrast extravasation in the mid left abdomen. No definite abscess is seen. Surgical consultation is recommended. 2. Mild ascites. 3. Diffuse colonic wall thickening which may be due to associated inflammatory changes. 4. Patchy airspace disease at the lung bases with bilateral lower lobe  consolidation. 5. Cholelithiasis. 6. Aortic atherosclerosis. Critical Value/emergent results were called by telephone at the time of interpretation on 10/31/2023 at 11:59 pm to provider Dr. Antionette Char, who verbally acknowledged these results. Electronically Signed   By: Thornell Sartorius M.D.   On: 10/31/2023 23:59   CT ABDOMEN PELVIS WO CONTRAST Result Date: 10/27/2023 CLINICAL DATA:  Acute nonlocalized abdominal pain. Chills and fever. Vomiting and diarrhea. EXAM: CT ABDOMEN AND PELVIS WITHOUT CONTRAST TECHNIQUE: Multidetector CT imaging of the abdomen and pelvis was performed following the standard protocol without IV contrast. RADIATION DOSE REDUCTION: This exam was performed according to the departmental dose-optimization program which includes automated exposure control, adjustment of the mA and/or kV according to patient size and/or use of iterative reconstruction technique. COMPARISON:  CT abdomen and pelvis 04/19/2021 FINDINGS: Lower chest: Bibasilar atelectasis.  No acute abnormality. Hepatobiliary: Cholelithiasis without evidence of acute cholecystitis. No biliary dilation. Unremarkable noncontrast appearance of the liver. Pancreas: Unremarkable. Spleen: Unremarkable. Adrenals/Urinary Tract: Normal adrenal glands. No urinary calculi or hydronephrosis. Unremarkable bladder. Stomach/Bowel: Stomach is within normal  limits. No bowel obstruction. Colonic diverticulosis. Wall thickening, inflammatory stranding, and free fluid about the descending colon. Loosely organized fluid and gas about the inflamed descending colon (circa series 3/image 49-50) compatible with contained perforation. The adjacent jejunum is inflamed. Postoperative change about the colon with ileocolonic anastomosis in the right upper quadrant. Vascular/Lymphatic: Aortic atherosclerosis. No enlarged abdominal or pelvic lymph nodes. Reproductive: No acute abnormality. Other: Fat containing periumbilical hernia. Musculoskeletal: No acute fracture.  IMPRESSION: 1. Acute diverticulitis of the descending colon with contained perforation. 2. Inflamed jejunum adjacent to the perforated diverticulitis. 3.  Aortic Atherosclerosis (ICD10-I70.0). Critical Value/emergent results were called by telephone at the time of interpretation on 10/27/2023 at 9:22 pm to provider [facility] , who verbally acknowledged these results. Electronically Signed   By: Minerva Fester M.D.   On: 10/27/2023 21:24    Signature  -   Susa Raring M.D on 11/09/2023 at 10:21 AM   -  To page go to [email or web address]

## 2023-11-09 NOTE — Progress Notes (Signed)
PHARMACY - ANTICOAGULATION CONSULT NOTE  Pharmacy Consult:  Heparin Indication: atrial fibrillation  Allergies  Allergen Reactions   Nsaids Other (See Comments)    CKD stage 4   Lisinopril Other (See Comments)    coughing   Peanut-Containing Drug Products Itching and Other (See Comments)    GI intolerance - diarrhea    Patient Measurements: Height: 5' 7.01" (170.2 cm) Weight: 98.2 kg (216 lb 7.9 oz) IBW/kg (Calculated) : 61.62 Heparin Dosing Weight: 83 kg  Vital Signs: Temp: 97.5 F (36.4 C) (02/15 0800) Temp Source: Oral (02/15 0800) BP: 172/141 (02/15 0800) Pulse Rate: 78 (02/15 0800)  Labs: Recent Labs    11/07/23 0319 11/07/23 1431 11/08/23 0620 11/09/23 0958  HGB 9.3*  --  9.2* 8.7*  HCT 28.3*  --  28.2* 26.8*  PLT 320  --  315 300  HEPARINUNFRC 0.22* 0.36 0.51 0.52  CREATININE 5.48* 6.34* 7.14* 5.43*    Estimated Creatinine Clearance: 11.9 mL/min (A) (by C-G formula based on SCr of 5.43 mg/dL (H)).  Medications:  PTA apixaban 2.5mg  PO BID (last dose 10/27/2023 AM)  Assessment: 23 yoF presents with abdominal pain with complicated diverticulitis w/ contained perforation and possible need for colectomy. PMH: CKD 5, A fib (apixaban), CAD, and CVA.  Pharmacy consulted to dose IV heparin for Afib. 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   Heparin level therapeutic at 0.52 with heparin running at 1800 u/h. Hgb stable in 8.7, PLT stable in 300s. No signs of bleeding or issues with heparin infusion noted.   Goal of Therapy:  Heparin level 0.3-0.7 units/ml Monitor platelets by anticoagulation protocol: Yes   Plan:  Continue heparin at 1800 units / hr Daily CBC and heparin level Monitor for S/sx of bleeding  Transition to home apixaban when appropriate   Roslyn Smiling, PharmD PGY1 Pharmacy Resident 11/09/2023 11:06 AM

## 2023-11-09 NOTE — Progress Notes (Signed)
7 Days Post-Op   Subjective/Chief Complaint: Pt doing well this AM No n/v with NGT clamped   Objective: Vital signs in last 24 hours: Temp:  [97.2 F (36.2 C)-98.2 F (36.8 C)] 97.9 F (36.6 C) (02/15 0310) Pulse Rate:  [71-88] 81 (02/15 0310) Resp:  [13-22] 17 (02/15 0310) BP: (136-178)/(62-139) 177/78 (02/15 0310) SpO2:  [97 %-100 %] 98 % (02/15 0310) Weight:  [96.5 kg-105.9 kg] 98.2 kg (02/15 0444) Last BM Date : 11/08/23  Intake/Output from previous day: 02/14 0701 - 02/15 0700 In: 1783.8 [I.V.:800.4; IV Piggyback:983.3] Out: 1710 [Urine:310; Emesis/NG output:400] Intake/Output this shift: No intake/output data recorded.  PE:  Constitutional: No acute distress, conversant, appears states age. Eyes: Anicteric sclerae, moist conjunctiva, no lid lag Lungs: Clear to auscultation bilaterally, normal respiratory effort CV: regular rate and rhythm, no murmurs, no peripheral edema, pedal pulses 2+ GI: Soft, no masses or hepatosplenomegaly, non-tender to palpation. Ostomy functioning Skin: No rashes, palpation reveals normal turgor Psychiatric: appropriate judgment and insight, oriented to person, place, and time   Lab Results:  Recent Labs    11/07/23 0319 11/08/23 0620  WBC 31.3* 27.8*  HGB 9.3* 9.2*  HCT 28.3* 28.2*  PLT 320 315   BMET Recent Labs    11/07/23 1431 11/08/23 0620  NA 133* 134*  K 4.1 3.1*  CL 97* 95*  CO2 18* 22  GLUCOSE 216* 344*  BUN 45* 55*  CREATININE 6.34* 7.14*  CALCIUM 7.6* 7.4*   PT/INR No results for input(s): "LABPROT", "INR" in the last 72 hours. ABG No results for input(s): "PHART", "HCO3" in the last 72 hours.  Invalid input(s): "PCO2", "PO2"  Studies/Results: IR Fluoro Guide CV Line Right Result Date: 11/08/2023 INDICATION: ESRD.  IV antibiotics. EXAM: ULTRASOUND AND FLUOROSCOPIC GUIDED PLACEMENT OF TUNNELED CENTRAL VENOUS "Powerline" CATHETER MEDICATIONS: The patient was on scheduled IV antibiotics.  ANESTHESIA/SEDATION: Local anesthetic was administered. The patient was continuously monitored during the procedure by the interventional radiology nurse under my direct supervision. FLUOROSCOPY TIME:  Fluoroscopic dose; 3.8 mGy COMPLICATIONS: None immediate. PROCEDURE: Informed written consent was obtained from the patient and/or patient's representative after a discussion of the risks, benefits, and alternatives to treatment. Questions regarding the procedure were encouraged and answered. The LEFT neck and chest were prepped with chlorhexidine in a sterile fashion, and a sterile drape was applied covering the operative field. Maximum barrier sterile technique with sterile gowns and gloves were used for the procedure. A timeout was performed prior to the initiation of the procedure. After the overlying soft tissues were anesthetized, a small venotomy incision was created and a micropuncture kit was utilized to access the internal jugular vein. Real-time ultrasound guidance was utilized for vascular access including the acquisition of a permanent ultrasound image documenting patency of the accessed vessel. The microwire was utilized to measure appropriate catheter length. The micropuncture sheath was exchanged for a peel-away sheath over a guidewire. A 5 Fr dual lumen tunneled central venous catheter measuring 33 cm was tunneled in a retrograde fashion from the anterior chest wall to the venotomy incision. The catheter was then placed through the peel-away sheath with tip ultimately positioned at the superior caval-atrial junction. Final catheter positioning was confirmed and documented with a spot radiographic image. The catheter aspirates and flushes normally. The catheter was flushed with appropriate volume heparin dwells. The catheter exit site was secured with a 3-0 Ethilon retention suture. The venotomy incision was closed with Dermabond. Dressings were applied. The patient tolerated the procedure well without  immediate post procedural complication. FINDINGS: After catheter placement, the tip lies within the superior apect of the right atrium. The catheter aspirates and flushes normally and is ready for immediate use. IMPRESSION: Successful placement of 33 cm dual lumen tunneled "Powerline" central venous catheter via the LEFT internal jugular vein The tip of the catheter is positioned within the proximal RIGHT atrium. The catheter is ready for immediate use. Roanna Banning, MD [facility] Radiology Electronically Signed   By: Roanna Banning M.D.   On: 11/08/2023 18:10   IR US Guide Vasc Access Left Result Date: 11/08/2023 INDICATION: ESRD.  IV antibiotics. EXAM: ULTRASOUND AND FLUOROSCOPIC GUIDED PLACEMENT OF TUNNELED CENTRAL VENOUS "Powerline" CATHETER MEDICATIONS: The patient was on scheduled IV antibiotics. ANESTHESIA/SEDATION: Local anesthetic was administered. The patient was continuously monitored during the procedure by the interventional radiology nurse under my direct supervision. FLUOROSCOPY TIME:  Fluoroscopic dose; 3.8 mGy COMPLICATIONS: None immediate. PROCEDURE: Informed written consent was obtained from the patient and/or patient's representative after a discussion of the risks, benefits, and alternatives to treatment. Questions regarding the procedure were encouraged and answered. The LEFT neck and chest were prepped with chlorhexidine in a sterile fashion, and a sterile drape was applied covering the operative field. Maximum barrier sterile technique with sterile gowns and gloves were used for the procedure. A timeout was performed prior to the initiation of the procedure. After the overlying soft tissues were anesthetized, a small venotomy incision was created and a micropuncture kit was utilized to access the internal jugular vein. Real-time ultrasound guidance was utilized for vascular access including the acquisition of a permanent ultrasound image  documenting patency of the accessed vessel. The microwire was utilized to measure appropriate catheter length. The micropuncture sheath was exchanged for a peel-away sheath over a guidewire. A 5 Fr dual lumen tunneled central venous catheter measuring 33 cm was tunneled in a retrograde fashion from the anterior chest wall to the venotomy incision. The catheter was then placed through the peel-away sheath with tip ultimately positioned at the superior caval-atrial junction. Final catheter positioning was confirmed and documented with a spot radiographic image. The catheter aspirates and flushes normally. The catheter was flushed with appropriate volume heparin dwells. The catheter exit site was secured with a 3-0 Ethilon retention suture. The venotomy incision was closed with Dermabond. Dressings were applied. The patient tolerated the procedure well without immediate post procedural complication. FINDINGS: After catheter placement, the tip lies within the superior apect of the right atrium. The catheter aspirates and flushes normally and is ready for immediate use. IMPRESSION: Successful placement of 33 cm dual lumen tunneled "Powerline" central venous catheter via the LEFT internal jugular vein The tip of the catheter is positioned within the proximal RIGHT atrium. The catheter is ready for immediate use. Roanna Banning, MD [facility] Radiology Electronically Signed   By: Roanna Banning M.D.   On: 11/08/2023 18:10   VAS [place] LOWER EXTREMITY VENOUS (DVT) Result Date: 11/07/2023  Lower Venous DVT Study Patient Name:  [name]  Date of Exam:   11/07/2023 Medical Rec #: [id number]          Accession #:    [id number] Date of Birth: [birth year]          Patient Gender: F Patient Age:   68 years Exam Location:  [facility] Procedure:      VAS [place] LOWER EXTREMITY VENOUS (DVT) Referring Phys: Tresa Endo OSBORNE  --------------------------------------------------------------------------------  Indications: Pain,  stroke, and A-fib, anemia.  Comparison Study: No prior exam. Performing Technologist: Fernande Bras  Examination Guidelines: A complete evaluation includes B-mode imaging, spectral Doppler, color Doppler, and power Doppler as needed of all accessible portions of each vessel. Bilateral testing is considered an integral part of a complete examination. Limited examinations for reoccurring indications may be performed as noted. The reflux portion of the exam is performed with the patient in reverse Trendelenburg.  +---------+---------------+---------+-----------+----------+--------------+ RIGHT    CompressibilityPhasicitySpontaneityPropertiesThrombus Aging +---------+---------------+---------+-----------+----------+--------------+ CFV      Full           Yes      Yes                                 +---------+---------------+---------+-----------+----------+--------------+ SFJ      Full           Yes      Yes                                 +---------+---------------+---------+-----------+----------+--------------+ FV Prox  Full                                                        +---------+---------------+---------+-----------+----------+--------------+ FV Mid   Full                                                        +---------+---------------+---------+-----------+----------+--------------+ FV DistalFull                                                        +---------+---------------+---------+-----------+----------+--------------+ PFV      Full                                                        +---------+---------------+---------+-----------+----------+--------------+ POP      Full           Yes      Yes                                 +---------+---------------+---------+-----------+----------+--------------+ PTV      Full                                                         +---------+---------------+---------+-----------+----------+--------------+ PERO     Full                                                        +---------+---------------+---------+-----------+----------+--------------+   +---------+---------------+---------+-----------+----------+--------------+  LEFT     CompressibilityPhasicitySpontaneityPropertiesThrombus Aging +---------+---------------+---------+-----------+----------+--------------+ CFV      Full           Yes      Yes                                 +---------+---------------+---------+-----------+----------+--------------+ SFJ      Full           Yes      Yes                                 +---------+---------------+---------+-----------+----------+--------------+ FV Prox  Full                                                        +---------+---------------+---------+-----------+----------+--------------+ FV Mid   Full                                                        +---------+---------------+---------+-----------+----------+--------------+ FV DistalFull                                                        +---------+---------------+---------+-----------+----------+--------------+ PFV      Full                                                        +---------+---------------+---------+-----------+----------+--------------+ POP      Full           Yes      Yes                                 +---------+---------------+---------+-----------+----------+--------------+ PTV      Full                                                        +---------+---------------+---------+-----------+----------+--------------+ PERO     Full                                                        +---------+---------------+---------+-----------+----------+--------------+     Summary: BILATERAL: - No evidence of deep vein thrombosis seen in the lower  extremities, bilaterally. -No evidence of popliteal cyst, bilaterally.   *See table(s) above for measurements and observations. Electronically signed by Heath Lark on 11/07/2023 at 4:05:14 PM.    Final     Anti-infectives:  Anti-infectives (From admission, onward)    Start     Dose/Rate Route Frequency Ordered Stop   11/08/23 1200  erythromycin (EES) 400 MG/5ML suspension 400 mg        400 mg Oral Every 6 hours 11/08/23 0815     11/06/23 0815  piperacillin-tazobactam (ZOSYN) IVPB 2.25 g        2.25 g 100 mL/hr over 30 Minutes Intravenous Every 8 hours 11/06/23 0729     11/01/23 1415  piperacillin-tazobactam (ZOSYN) IVPB 2.25 g        2.25 g 100 mL/hr over 30 Minutes Intravenous Every 8 hours 11/01/23 1316 11/06/23 0602   10/29/23 1100  piperacillin-tazobactam (ZOSYN) IVPB 2.25 g  Status:  Discontinued        2.25 g 100 mL/hr over 30 Minutes Intravenous Every 8 hours 10/29/23 1006 11/01/23 1016   10/28/23 0600  piperacillin-tazobactam (ZOSYN) IVPB 2.25 g  Status:  Discontinued        2.25 g 100 mL/hr over 30 Minutes Intravenous Every 8 hours 10/27/23 2224 10/27/23 2235   10/28/23 0600  ceFEPIme (MAXIPIME) 1 g in sodium chloride 0.9 % 100 mL IVPB  Status:  Discontinued        1 g 200 mL/hr over 30 Minutes Intravenous Every 24 hours 10/27/23 2235 10/29/23 0954   10/28/23 0600  metroNIDAZOLE (FLAGYL) IVPB 500 mg  Status:  Discontinued        500 mg 100 mL/hr over 60 Minutes Intravenous Every 12 hours 10/27/23 2235 10/29/23 0954   10/27/23 2130  piperacillin-tazobactam (ZOSYN) IVPB 3.375 g        3.375 g 100 mL/hr over 30 Minutes Intravenous  Once 10/27/23 2128 10/27/23 2320       Assessment/Plan: POD 7, s/p Hartmann's by Dr. Freida Busman 11/02/23 forSigmoid diverticulitis with perforation  -cont abx given WBC  but unclear etiology currently.   duplex of LEs = neg -DC NGT  -start erythromycin -hgb 9.2 -therapies for mobilization. -VAC to midline wound, will change M/Th -WOC for colostomy  care       FEN: DC NGT and start fulls TNA after new line placement VTE: heparin gtt - hgb stable ID: cefepime/flagyl 2/3>2/4; Zosyn 2/4>>   - per TRH -  PAF  HTN HLD AKI on CKD stage V - on HD CAD T2DM  LOS: 13 days    Axel Filler 11/09/2023

## 2023-11-09 NOTE — Progress Notes (Signed)
Daily Progress Note   Patient Name: [name]       Date: 11/09/2023 DOB: 1956-06-30  Age: 68 y.o. MRN#: [id number] Attending Physician: Leroy Sea, MD Primary Care Physician: Hillery Aldo, NP Admit Date: 10/27/2023  Reason for Consultation/Follow-up: Establishing goals of care   Length of Stay: 13  Current Medications: Scheduled Meds:   sodium chloride   Intravenous Once   sodium chloride   Intravenous Once   carvedilol  3.125 mg Oral BID WC   Chlorhexidine Gluconate Cloth  6 each Topical Q0600   erythromycin  400 mg Oral Q6H   insulin aspart  0-15 Units Subcutaneous Q4H   insulin glargine-yfgn  12 Units Subcutaneous Daily   lidocaine  20 mL Intradermal Once   mouth rinse  15 mL Mouth Rinse 4 times per day   pantoprazole (PROTONIX) IV  40 mg Intravenous Q12H   potassium chloride  20 mEq Oral Once   sodium chloride flush  10-40 mL Intracatheter Q12H    Continuous Infusions:  heparin 1,800 Units/hr (11/09/23 0712)   piperacillin-tazobactam (ZOSYN)  IV 2.25 g (11/09/23 0530)   TPN ADULT (ION) 40 mL/hr at 11/09/23 0300    PRN Meds: acetaminophen **OR** acetaminophen, albuterol, hydrALAZINE, HYDROmorphone (DILAUDID) injection, menthol-cetylpyridinium, ondansetron (ZOFRAN) IV, mouth rinse  Physical Exam Vitals reviewed.  Constitutional:      General: She is not in acute distress.    Interventions: Nasal cannula in place.  HENT:     Head: Normocephalic and atraumatic.  Cardiovascular:     Rate and Rhythm: Normal rate.  Pulmonary:     Effort: Pulmonary effort is normal.  Abdominal:     Comments: colostomy  Skin:    General: Skin is warm and dry.  Neurological:     Mental Status: She is alert and oriented to person, place, and time.  Psychiatric:         Mood and Affect: Mood normal.        Behavior: Behavior normal.             Vital Signs: BP (!) 172/141 (BP Location: Right Arm)   Pulse 78   Temp (!) 97.5 F (36.4 C) (Oral)   Resp 19   Ht 5' 7.01" (1.702 m)   Wt 98.2 kg   SpO2 100%   BMI  33.90 kg/m  SpO2: SpO2: 100 % O2 Device: O2 Device: Nasal Cannula O2 Flow Rate: O2 Flow Rate (L/min): 4 L/min     Patient Active Problem List   Diagnosis Date Noted   Diverticulitis of large intestine with complication 10/27/2023   Chronic kidney disease, stage 5 (HCC) 10/27/2023   Chronic nonintractable headache 03/03/2021   Foot callus 02/27/2021   Counseled about COVID-19 virus infection 02/24/2020   Aortic heart murmur 05/15/2019   Thyroid enlargement 07/19/2014   Vitamin D deficiency 09/03/2013   Hyperlipidemia 04/24/2013   Malunion of fracture 03/17/2013   Wound infection after surgery 03/17/2013   Diabetic retinopathy (HCC) 01/23/2013   Chronic atrial fibrillation (HCC) 01/15/2013   CAD (coronary artery disease) 01/11/2013   H/O non-ST elevation myocardial infarction (NSTEMI) 01/09/2013   History of stroke 01/08/2013   Diabetic neuropathy 01/08/2013   Hypertension    Diabetes mellitus    Chronic kidney disease (CKD), stage IV (severe) [facility])     Palliative Care Assessment & Plan   Patient Profile: 68 y.o. female  with past medical history of CKD stage V, A-fib on Eliquis, DM type 2, CAD, and HTN who presented to the ED on 10/27/2023 with abdominal pain and was found to have acute diverticulitis with contained perforation involving the descending colon.  She also has AKI on CKD stage V, and BUN/creatinine is trending up.   Palliative Medicine has been consulted for goals of care and medical decision making.  Today's Discussion: Patient sitting up in bed in NAD. Shares she is cold-- get her a warm blanket. She reports her abdominal pain is better and she has not required any prn pain medication. Patient is relieved her NG  tube was removed today. She feels much more comfortable without it. She is still receiving TNA but is also on a trial clear liquid diet. She shares that they are waiting for her gut to start working. She had dialysis last night which was okay but she is glad to not have dialysis again until Monday.  We discuss how much she has been through in the last two weeks. She shares her sisters who pressured her into dialysis and the surgery are not seeing all that she is going through for them. She also shares she is going to give this her "best shot." We discuss that she is ultimately in charge of her health care and can stop dialysis at anytime. I shared that if she decides to change the path of her care at any time we can facilitate a meeting with her sisters and family. She understands. She shares that right now she does not know which direction this will all go-- whether she will live or die. She is glad she changed her code status to DNR/DNI. She shares she would not want to suffer through CPR at the end of life. Emotional support provided.  Encouraged her to call PMT with questions or concerns. PMT will continue to follow.  Recommendations/Plan: DNR Limited scope Continue current interventions PMT will continue to support   Code Status:    Code Status Orders  (From admission, onward)           Start     Ordered   11/01/23 1016  Do not attempt resuscitation (DNR) - Comfort care  Continuous       Question Answer Comment  If patient has no pulse and is not breathing Do Not Attempt Resuscitation   In Pre-Arrest Conditions (Patient Is Breathing and Has a  Pulse) Provide comfort measures. Relieve any mechanical airway obstruction. Avoid transfer unless required for comfort.   Consent: Discussion documented in EHR or advanced directives reviewed      11/01/23 1016           Extensive chart review has been completed prior to seeing the patient including labs, vital signs, imaging,  progress/consult notes, orders, medications, and available advance directive documents.  Care plan was discussed with bedside RN  Time: 65 minutes  Thank you for allowing the Palliative Medicine Team to assist in the care of this patient.    Sherryll Burger, NP  Please contact Palliative Medicine Team phone at [phone] for questions and concerns.

## 2023-11-10 DIAGNOSIS — Z7189 Other specified counseling: Secondary | ICD-10-CM | POA: Diagnosis not present

## 2023-11-10 DIAGNOSIS — K5732 Diverticulitis of large intestine without perforation or abscess without bleeding: Secondary | ICD-10-CM | POA: Diagnosis not present

## 2023-11-10 DIAGNOSIS — Z515 Encounter for palliative care: Secondary | ICD-10-CM | POA: Diagnosis not present

## 2023-11-10 LAB — CBC WITH DIFFERENTIAL/PLATELET
Abs Immature Granulocytes: 0 10*3/uL (ref 0.00–0.07)
Basophils Absolute: 0 10*3/uL (ref 0.0–0.1)
Basophils Relative: 0 %
Eosinophils Absolute: 0.2 10*3/uL (ref 0.0–0.5)
Eosinophils Relative: 1 %
HCT: 24.5 % — ABNORMAL LOW (ref 36.0–46.0)
Hemoglobin: 7.9 g/dL — ABNORMAL LOW (ref 12.0–15.0)
Lymphocytes Relative: 7 %
Lymphs Abs: 1.5 10*3/uL (ref 0.7–4.0)
MCH: 26.7 pg (ref 26.0–34.0)
MCHC: 32.2 g/dL (ref 30.0–36.0)
MCV: 82.8 fL (ref 80.0–100.0)
Monocytes Absolute: 3.9 10*3/uL — ABNORMAL HIGH (ref 0.1–1.0)
Monocytes Relative: 18 %
Neutro Abs: 15.9 10*3/uL — ABNORMAL HIGH (ref 1.7–7.7)
Neutrophils Relative %: 74 %
Platelets: 300 10*3/uL (ref 150–400)
RBC: 2.96 MIL/uL — ABNORMAL LOW (ref 3.87–5.11)
RDW: 16.1 % — ABNORMAL HIGH (ref 11.5–15.5)
WBC: 21.5 10*3/uL — ABNORMAL HIGH (ref 4.0–10.5)
nRBC: 0 % (ref 0.0–0.2)
nRBC: 0 /100{WBCs}

## 2023-11-10 LAB — BASIC METABOLIC PANEL
Anion gap: 14 (ref 5–15)
BUN: 45 mg/dL — ABNORMAL HIGH (ref 8–23)
CO2: 24 mmol/L (ref 22–32)
Calcium: 7.3 mg/dL — ABNORMAL LOW (ref 8.9–10.3)
Chloride: 99 mmol/L (ref 98–111)
Creatinine, Ser: 6.44 mg/dL — ABNORMAL HIGH (ref 0.44–1.00)
GFR, Estimated: 7 mL/min — ABNORMAL LOW (ref >60)
Glucose, Bld: 122 mg/dL — ABNORMAL HIGH (ref 70–99)
Potassium: 3.4 mmol/L — ABNORMAL LOW (ref 3.5–5.1)
Sodium: 137 mmol/L (ref 135–145)

## 2023-11-10 LAB — GLUCOSE, CAPILLARY
Glucose-Capillary: 135 mg/dL — ABNORMAL HIGH (ref 70–99)
Glucose-Capillary: 141 mg/dL — ABNORMAL HIGH (ref 70–99)
Glucose-Capillary: 168 mg/dL — ABNORMAL HIGH (ref 70–99)
Glucose-Capillary: 191 mg/dL — ABNORMAL HIGH (ref 70–99)
Glucose-Capillary: 134 mg/dL — ABNORMAL HIGH (ref 70–99)
Glucose-Capillary: 151 mg/dL — ABNORMAL HIGH (ref 70–99)

## 2023-11-10 LAB — PROCALCITONIN: Procalcitonin: 15.86 ng/mL

## 2023-11-10 LAB — MRSA NEXT GEN BY PCR, NASAL: MRSA by PCR Next Gen: NOT DETECTED

## 2023-11-10 LAB — MAGNESIUM: Magnesium: 1.9 mg/dL (ref 1.7–2.4)

## 2023-11-10 LAB — C-REACTIVE PROTEIN: CRP: 12.1 mg/dL — ABNORMAL HIGH (ref <1.0)

## 2023-11-10 LAB — HEPARIN LEVEL (UNFRACTIONATED): Heparin Unfractionated: 0.59 [IU]/mL (ref 0.30–0.70)

## 2023-11-10 LAB — PHOSPHORUS: Phosphorus: 2.3 mg/dL — ABNORMAL LOW (ref 2.5–4.6)

## 2023-11-10 MED ORDER — ALTEPLASE 2 MG IJ SOLR
2.0000 mg | Freq: Once | INTRAMUSCULAR | Status: DC
  Filled 2023-11-10: qty 2

## 2023-11-10 MED ORDER — TRACE MINERALS CU-MN-SE-ZN 300-55-60-3000 MCG/ML IV SOLN
INTRAVENOUS | Status: DC
Start: 2023-11-10 — End: 2023-11-11
  Filled 2023-11-10: qty 384

## 2023-11-10 MED ORDER — POTASSIUM CHLORIDE 20 MEQ PO PACK
20.0000 meq | PACK | Freq: Once | ORAL | Status: AC
  Administered 2023-11-10: 20 meq via ORAL
  Filled 2023-11-10: qty 1

## 2023-11-10 NOTE — Progress Notes (Deleted)
Routine line care: no blood return from distal (brown) lumen despite position changes, deep breathing techniques. Line flushes without difficulty. Alteplase ordered. Central line continued due to limited venous access/ RUE restriction.

## 2023-11-10 NOTE — Progress Notes (Signed)
PROGRESS NOTE        PATIENT DETAILS Name: [name] Age: 68 y.o. Sex: female Date of Birth: 03-25-1956 Admit Date: 10/27/2023 Admitting Physician Dolly Rias, MD ZOX:[id number], Devonne Doughty, NP  Brief Summary: Patient is a 68 y.o.  female with history of CKD 5, A-fib on Eliquis, CAD-who presented with abdominal pain-found to have acute diverticulitis with contained perforation involving the descending colon.  Hospital course complicated by worsening AKI-thought to have progressed to ESRD-requiring initiation of HD.  See below for further details.  Significant events: 2/2>> admit to [facility] 2/6>> CT abdomen worsening diverticulitis with perforation 2/7>> transitioned to full comfort measures-as with worsening AKI-however multiple family members arrived-comfort care revoked-now full code-full scope of treatment including HD/laparotomy with colostomy.  General surgery/nephrology reconsulted.  IR placed [facility] and patient underwent first hemodialysis.  2/8>> remains n.p.o-colectomy/colostomy later today.  Significant studies: 2/2>> CT abdomen/pelvis: Acute diverticulitis-descending colon with contained perforation 2/6>> CT abdomen/pelvis: Worsening diverticulitis of descending colon with perforation and increasing failure-suspected oral contrast extravasation left abdomen.  Significant microbiology data: 2/2>> COVID/influenza/RSV PCR: Negative  Procedures: 2/8>> by Dr. Freida Busman underwent exploratory laparotomy with Hartman's resection for perforated diverticulitis with diffuse peritonitis, colostomy in place. 2/13 - R IJ Trialysis catheter - IR 2/14 -left IJ Central line placed by IR for TNA  Consults: Surgery Renal Palliative IR  Subjective: Patient sitting up in recliner denies any headache chest or abdominal pain, no nausea, NG and Foley out, tolerated clear liquid diet well yesterday.  Objective: Vitals: Blood pressure (!) 149/71, pulse 81, temperature 98.1  F (36.7 C), temperature source Oral, resp. rate 15, height 5' 7.01" (1.702 m), weight 100.4 kg, SpO2 99%.   Exam:  Awake Alert, No new F.N deficits, dark bilious secretion, colostomy bag in place, right IJ HD catheter,   [place].AT,[name], [name],   Symmetrical Chest wall movement, Good air movement bilaterally, CTAB RRR,No Gallops, Rubs or new Murmurs,  +ve B.Sounds, Abd Soft, No tenderness,   No Cyanosis, Clubbing or edema    Assessment/Plan:  Acute diverticulitis with contained perforation of the descending colon - Initially was managed conservatively, subsequently 2/8>> by Dr. Freida Busman general surgery she underwent exploratory laparotomy with Hartman's resection for perforated diverticulitis with diffuse peritonitis, colostomy in place, continue Zosyn, continue NG tube, defer further management of this issue to General surgery Case discussed with general surgery team on 11/05/2023, monitor closely on present treatment, does have rising leukocytosis but afebrile, repeat CT noted, case discussed with general surgery for now wait and watch and monitor.  An IV TNA started on 11/07/2023 evening, NG tube discontinued by general surgery on 11/09/2023, she has shown clinical improvement, has tolerated clear liquid diet well on 11/09/2023, advance to soft diet on 11/10/2023.  If tolerates.  TNA on 11/11/2023.   AKI on CKD 5 with progression to ESRD AKI likely hemodynamically mediated-has now likely progressed to ESRD.  HD initiated via right IJ temporary catheter, switched to right IJ trialysis catheter by IR on 11/07/2023.  CAD Currently no anginal symptoms Suspect not on antiplatelets as on anticoagulation with Eliquis/heparin Continue beta-blocker On Repatha injections as an outpatient.  PAF Sinus rhythm Continue Coreg Currently on heparin drip with caution.  Anemia of acute illness  Brought on by acute illness, underlying renal insufficiency now developing to ESRD, surgical blood  loss, gradually trending down, 2 units  of packed RBC on 11/04/2023, on IV PPI. CBC looks stable on 11/05/2023 will continue to monitor closely.  HTN BP stable-placed on Coreg/amlodipine.  DM-2 (A1c 6.6 on 2/3) On TNA and now on clear liquids, every 4 moderate sliding scale, low-dose Semglee added on 11/09/2023.  Monitor with caution.  Recent Labs    11/09/23 2316 11/10/23 0429 11/10/23 0902  GLUCAP 106* 135* 141*    Palliative care/goals of care Initially DNR-and wanted to do comfort care, now is full code wants to undergo aggressive treatment including surgery and dialysis.  Full code now.  Class 1 Obesity: Estimated body mass index is 34.66 kg/m as calculated from the following:   Height as of this encounter: 5' 7.01" (1.702 m).   Weight as of this encounter: 100.4 kg.    Code status:   Code Status: Limited: Do not attempt resuscitation (DNR) -DNR-LIMITED -Do Not Intubate/DNI    DVT Prophylaxis: Place and maintain sequential compression device Start: 11/06/23 0613 IV Heparin  Family Communication:  Verlene Mayer updated 2/7  Disposition Plan: Status is: Inpatient Remains inpatient appropriate because: Severity of illness   Planned Discharge Destination:Residential hospice   Diet: Diet Order             Diet full liquid Fluid consistency: Thin  Diet effective now                   MEDICATIONS: Scheduled Meds:  sodium chloride   Intravenous Once   sodium chloride   Intravenous Once   carvedilol  3.125 mg Oral BID WC   Chlorhexidine Gluconate Cloth  6 each Topical Q0600   erythromycin  400 mg Oral Q6H   insulin aspart  0-20 Units Subcutaneous Q4H   lidocaine  20 mL Intradermal Once   mouth rinse  15 mL Mouth Rinse 4 times per day   pantoprazole (PROTONIX) IV  40 mg Intravenous Q12H   potassium chloride  20 mEq Oral Once   sodium chloride flush  10-40 mL Intracatheter Q12H   Continuous Infusions:  heparin 1,800 Units/hr (11/10/23 0602)    piperacillin-tazobactam (ZOSYN)  IV 2.25 g (11/10/23 0602)   TPN ADULT (ION) 40 mL/hr at 11/10/23 0602   PRN Meds:.acetaminophen **OR** acetaminophen, albuterol, hydrALAZINE, HYDROmorphone (DILAUDID) injection, menthol-cetylpyridinium, ondansetron (ZOFRAN) IV, mouth rinse   I have personally reviewed following labs and imaging studies  LABORATORY DATA:   Data Review:    Recent Labs  Lab 11/06/23 0345 11/07/23 0319 11/08/23 0620 11/09/23 0958 11/10/23 0139  WBC 26.8* 31.3* 27.8* 24.7* 21.5*  HGB 9.2* 9.3* 9.2* 8.7* 7.9*  HCT 27.9* 28.3* 28.2* 26.8* 24.5*  PLT 309 320 315 300 300  MCV 79.7* 80.9 80.8 81.7 82.8  MCH 26.3 26.6 26.4 26.5 26.7  MCHC 33.0 32.9 32.6 32.5 32.2  RDW 15.8* 16.0* 15.9* 16.0* 16.1*  LYMPHSABS 0.5* 0.9 0.8 1.5 1.5  MONOABS 2.4* 1.9* 2.0* 0.7 3.9*  EOSABS 0.3 0.3 0.4 0.5 0.2  BASOSABS 0.0 0.0 0.2* 0.0 0.0    Recent Labs  Lab 11/04/23 0209 11/04/23 0209 11/04/23 1455 11/05/23 0247 11/06/23 0345 11/07/23 0319 11/07/23 1431 11/08/23 0620 11/09/23 0958 11/10/23 0139  NA  --    < > 136 135 134* 136 133* 134* 133* 137  K  --    < > 3.9 3.9 3.8 3.9 4.1 3.1* 3.4* 3.4*  CL  --    < > 96* 94* 93* 96* 97* 95* 95* 99  CO2  --    < >  20* 21* 21* 20* 18* 22 25 24   ANIONGAP  --    < > 22* 20* 20* 20* 18* 17* 13 14  GLUCOSE  --    < > 224* 194* 254* 200* 216* 344* 358* 122*  BUN  --    < > 81* 47* 62* 38* 45* 55* 38* 45*  CREATININE  --    < > 9.18* 6.10* 7.45* 5.48* 6.34* 7.14* 5.43* 6.44*  AST  --   --   --   --   --   --  16  --   --   --   ALT  --   --   --   --   --   --  14  --   --   --   ALKPHOS  --   --   --   --   --   --  47  --   --   --   BILITOT  --   --   --   --   --   --  1.6*  --   --   --   ALBUMIN  --   --  1.8*  --   --   --  2.1*  --   --   --   CRP 24.8*  --   --  23.5* 19.3* 17.8*  --  16.2* 13.7* 12.1*  PROCALCITON 42.39  --   --  30.19 25.65 19.62  --  17.70 16.20 15.86  BNP 74.4  --   --  179.0* 244.4*  --   --   --   --   --    MG 2.3  --   --  2.1 2.3 2.1  --  2.1 1.8 1.9  PHOS  --   --  7.0*  --   --   --  5.5* 4.6 2.6 2.3*  CALCIUM  --    < > 7.4* 7.7* 7.5* 7.6* 7.6* 7.4* 7.3* 7.3*   < > = values in this interval not displayed.      Recent Labs  Lab 11/04/23 0209 11/04/23 1455 11/05/23 0247 11/06/23 0345 11/07/23 0319 11/07/23 1431 11/08/23 0620 11/09/23 0958 11/10/23 0139  CRP 24.8*  --  23.5* 19.3* 17.8*  --  16.2* 13.7* 12.1*  PROCALCITON 42.39  --  30.19 25.65 19.62  --  17.70 16.20 15.86  BNP 74.4  --  179.0* 244.4*  --   --   --   --   --   MG 2.3  --  2.1 2.3 2.1  --  2.1 1.8 1.9  CALCIUM  --    < > 7.7* 7.5* 7.6* 7.6* 7.4* 7.3* 7.3*   < > = values in this interval not displayed.    --------------------------------------------------------------------------------------------------------------- Lab Results  Component Value Date   CHOL 134 12/21/2019   HDL 49 12/21/2019   LDLCALC 69 12/21/2019   TRIG 82 12/21/2019   CHOLHDL 2.7 12/21/2019    Lab Results  Component Value Date   HGBA1C 6.6 (H) 10/28/2023   No results for input(s): "TSH", "T4TOTAL", "FREET4", "T3FREE", "THYROIDAB" in the last 72 hours.  No results for input(s): "VITAMINB12", "FOLATE", "FERRITIN", "TIBC", "IRON", "RETICCTPCT" in the last 72 hours. ------------------------------------------------------------------------------------------------------------------ Cardiac Enzymes No results for input(s): "CKMB", "TROPONINI", "MYOGLOBIN" in the last 168 hours.  Invalid input(s): "CK"  Micro Results Recent Results (from the past 240 hours)  MRSA Next Gen by PCR, Nasal     Status:  None   Collection Time: 11/10/23  5:14 AM   Specimen: Nasal Mucosa; Nasal Swab  Result Value Ref Range Status   MRSA by PCR Next Gen NOT DETECTED NOT DETECTED Final    Comment: (NOTE) The GeneXpert MRSA Assay (FDA approved for NASAL specimens only), is one component of a comprehensive MRSA colonization surveillance program. It is not  intended to diagnose MRSA infection nor to guide or monitor treatment for MRSA infections. Test performance is not FDA approved in patients less than 30 years old. Performed at [facility], [address], [place], [place] [address]      Radiology Reports IR Fluoro Guide CV Line Right Result Date: 11/08/2023 INDICATION: ESRD.  IV antibiotics. EXAM: ULTRASOUND AND FLUOROSCOPIC GUIDED PLACEMENT OF TUNNELED CENTRAL VENOUS "Powerline" CATHETER MEDICATIONS: The patient was on scheduled IV antibiotics. ANESTHESIA/SEDATION: Local anesthetic was administered. The patient was continuously monitored during the procedure by the interventional radiology nurse under my direct supervision. FLUOROSCOPY TIME:  Fluoroscopic dose; 3.8 mGy COMPLICATIONS: None immediate. PROCEDURE: Informed written consent was obtained from the patient and/or patient's representative after a discussion of the risks, benefits, and alternatives to treatment. Questions regarding the procedure were encouraged and answered. The LEFT neck and chest were prepped with chlorhexidine in a sterile fashion, and a sterile drape was applied covering the operative field. Maximum barrier sterile technique with sterile gowns and gloves were used for the procedure. A timeout was performed prior to the initiation of the procedure. After the overlying soft tissues were anesthetized, a small venotomy incision was created and a micropuncture kit was utilized to access the internal jugular vein. Real-time ultrasound guidance was utilized for vascular access including the acquisition of a permanent ultrasound image documenting patency of the accessed vessel. The microwire was utilized to measure appropriate catheter length. The micropuncture sheath was exchanged for a peel-away sheath over a guidewire. A 5 Fr dual lumen tunneled central venous catheter measuring 33 cm was tunneled in a retrograde fashion from the anterior chest wall to the venotomy incision.  The catheter was then placed through the peel-away sheath with tip ultimately positioned at the superior caval-atrial junction. Final catheter positioning was confirmed and documented with a spot radiographic image. The catheter aspirates and flushes normally. The catheter was flushed with appropriate volume heparin dwells. The catheter exit site was secured with a 3-0 Ethilon retention suture. The venotomy incision was closed with Dermabond. Dressings were applied. The patient tolerated the procedure well without immediate post procedural complication. FINDINGS: After catheter placement, the tip lies within the superior apect of the right atrium. The catheter aspirates and flushes normally and is ready for immediate use. IMPRESSION: Successful placement of 33 cm dual lumen tunneled "Powerline" central venous catheter via the LEFT internal jugular vein The tip of the catheter is positioned within the proximal RIGHT atrium. The catheter is ready for immediate use. Roanna Banning, MD [facility] Radiology Electronically Signed   By: Roanna Banning M.D.   On: 11/08/2023 18:10   IR US Guide Vasc Access Left Result Date: 11/08/2023 INDICATION: ESRD.  IV antibiotics. EXAM: ULTRASOUND AND FLUOROSCOPIC GUIDED PLACEMENT OF TUNNELED CENTRAL VENOUS "Powerline" CATHETER MEDICATIONS: The patient was on scheduled IV antibiotics. ANESTHESIA/SEDATION: Local anesthetic was administered. The patient was continuously monitored during the procedure by the interventional radiology nurse under my direct supervision. FLUOROSCOPY TIME:  Fluoroscopic dose; 3.8 mGy COMPLICATIONS: None immediate. PROCEDURE: Informed written consent was obtained from the patient and/or patient's representative after a  discussion of the risks, benefits, and alternatives to treatment. Questions regarding the procedure were encouraged and answered. The LEFT neck and chest were prepped with chlorhexidine in a sterile  fashion, and a sterile drape was applied covering the operative field. Maximum barrier sterile technique with sterile gowns and gloves were used for the procedure. A timeout was performed prior to the initiation of the procedure. After the overlying soft tissues were anesthetized, a small venotomy incision was created and a micropuncture kit was utilized to access the internal jugular vein. Real-time ultrasound guidance was utilized for vascular access including the acquisition of a permanent ultrasound image documenting patency of the accessed vessel. The microwire was utilized to measure appropriate catheter length. The micropuncture sheath was exchanged for a peel-away sheath over a guidewire. A 5 Fr dual lumen tunneled central venous catheter measuring 33 cm was tunneled in a retrograde fashion from the anterior chest wall to the venotomy incision. The catheter was then placed through the peel-away sheath with tip ultimately positioned at the superior caval-atrial junction. Final catheter positioning was confirmed and documented with a spot radiographic image. The catheter aspirates and flushes normally. The catheter was flushed with appropriate volume heparin dwells. The catheter exit site was secured with a 3-0 Ethilon retention suture. The venotomy incision was closed with Dermabond. Dressings were applied. The patient tolerated the procedure well without immediate post procedural complication. FINDINGS: After catheter placement, the tip lies within the superior apect of the right atrium. The catheter aspirates and flushes normally and is ready for immediate use. IMPRESSION: Successful placement of 33 cm dual lumen tunneled "Powerline" central venous catheter via the LEFT internal jugular vein The tip of the catheter is positioned within the proximal RIGHT atrium. The catheter is ready for immediate use. Roanna Banning, MD [facility] Radiology Electronically  Signed   By: Roanna Banning M.D.   On: 11/08/2023 18:10   VAS [place] LOWER EXTREMITY VENOUS (DVT) Result Date: 11/07/2023  Lower Venous DVT Study Patient Name:  JADAYA SOMMERFIELD [facility]  Date of Exam:   11/07/2023 Medical Rec #: [id number]          Accession #:    [id number] Date of Birth: 1956-05-21          Patient Gender: F Patient Age:   51 years Exam Location:  [facility] Procedure:      VAS [place] LOWER EXTREMITY VENOUS (DVT) Referring Phys: Tresa Endo OSBORNE --------------------------------------------------------------------------------  Indications: Pain, stroke, and A-fib, anemia.  Comparison Study: No prior exam. Performing Technologist: Fernande Bras  Examination Guidelines: A complete evaluation includes B-mode imaging, spectral Doppler, color Doppler, and power Doppler as needed of all accessible portions of each vessel. Bilateral testing is considered an integral part of a complete examination. Limited examinations for reoccurring indications may be performed as noted. The reflux portion of the exam is performed with the patient in reverse Trendelenburg.  +---------+---------------+---------+-----------+----------+--------------+ RIGHT    CompressibilityPhasicitySpontaneityPropertiesThrombus Aging +---------+---------------+---------+-----------+----------+--------------+ CFV      Full           Yes      Yes                                 +---------+---------------+---------+-----------+----------+--------------+ SFJ      Full           Yes      Yes                                 +---------+---------------+---------+-----------+----------+--------------+  FV Prox  Full                                                        +---------+---------------+---------+-----------+----------+--------------+ FV Mid   Full                                                        +---------+---------------+---------+-----------+----------+--------------+ FV DistalFull                                                         +---------+---------------+---------+-----------+----------+--------------+ PFV      Full                                                        +---------+---------------+---------+-----------+----------+--------------+ POP      Full           Yes      Yes                                 +---------+---------------+---------+-----------+----------+--------------+ PTV      Full                                                        +---------+---------------+---------+-----------+----------+--------------+ PERO     Full                                                        +---------+---------------+---------+-----------+----------+--------------+   +---------+---------------+---------+-----------+----------+--------------+ LEFT     CompressibilityPhasicitySpontaneityPropertiesThrombus Aging +---------+---------------+---------+-----------+----------+--------------+ CFV      Full           Yes      Yes                                 +---------+---------------+---------+-----------+----------+--------------+ SFJ      Full           Yes      Yes                                 +---------+---------------+---------+-----------+----------+--------------+ FV Prox  Full                                                        +---------+---------------+---------+-----------+----------+--------------+  FV Mid   Full                                                        +---------+---------------+---------+-----------+----------+--------------+ FV DistalFull                                                        +---------+---------------+---------+-----------+----------+--------------+ PFV      Full                                                        +---------+---------------+---------+-----------+----------+--------------+ POP      Full           Yes      Yes                                  +---------+---------------+---------+-----------+----------+--------------+ PTV      Full                                                        +---------+---------------+---------+-----------+----------+--------------+ PERO     Full                                                        +---------+---------------+---------+-----------+----------+--------------+     Summary: BILATERAL: - No evidence of deep vein thrombosis seen in the lower extremities, bilaterally. -No evidence of popliteal cyst, bilaterally.   *See table(s) above for measurements and observations. Electronically signed by Heath Lark on 11/07/2023 at 4:05:14 PM.    Final    CT ABDOMEN PELVIS WO CONTRAST Result Date: 11/06/2023 CLINICAL DATA:  Diverticulitis, complication suspected EXAM: CT ABDOMEN AND PELVIS WITHOUT CONTRAST TECHNIQUE: Multidetector CT imaging of the abdomen and pelvis was performed following the standard protocol without IV contrast. RADIATION DOSE REDUCTION: This exam was performed according to the departmental dose-optimization program which includes automated exposure control, adjustment of the mA and/or kV according to patient size and/or use of iterative reconstruction technique. COMPARISON:  10/31/2023 FINDINGS: Lower chest: Volume loss and opacity in the lower lungs consistent with atelectasis. There is a central line with tip at the upper right atrium. Coronary atherosclerosis. Hepatobiliary: No focal liver abnormality. Calcific in the intermediate density in the gallbladder attributed to stones. No biliary dilatation or pericholecystic edema. Pancreas: Unremarkable. Spleen: Unremarkable. Adrenals/Urinary Tract: Negative adrenals. No hydronephrosis or stone. Renal cortical thinning and lobulation at the left upper pole. 3 mm stone at the upper pole left kidney. The urinary bladder is collapsed around a Foley catheter. Stomach/Bowel: Descending colostomy. Bowel anastomosis in the right abdomen. No  evidence of bowel obstruction. Fluid collection with  loculation appearance/peritoneal thickening along the left flank measuring up to 5 x 6 x 10 cm. Milder fluid accumulation in the ventral peritoneal space. Vascular/Lymphatic: Extensive atheromatous calcification of the aorta and iliacs. No mass or adenopathy. Reproductive:No pathologic findings. Other: No ascites or pneumoperitoneum. Musculoskeletal: No acute abnormalities.  T9 butterfly vertebra IMPRESSION: 1. Interval colostomy with fluid collection along the left gutter, dominant pocket measuring 10 x 6 x 5 cm. 2. No bowel obstruction. 3. Cholelithiasis and left nephrolithiasis. Electronically Signed   By: Tiburcio Pea M.D.   On: 11/06/2023 07:12   DG Chest Port 1 View Result Date: 11/03/2023 CLINICAL DATA:  Shortness of breath EXAM: PORTABLE CHEST - 1 VIEW COMPARISON:  11/11/2018 FINDINGS: Unchanged mild cardiomegaly and pulmonary vascular congestion. Interval worsening of left basilar opacity likely due to atelectasis. Lungs otherwise clear. Examination is limited due to expiratory phase of imaging. Interval placement of temporary right IJ hemodialysis catheter which terminates in the region of the right atrium. Nasogastric tube terminates in the left upper quadrant. IMPRESSION: Interval worsening of left basilar opacity which is likely due to atelectasis. Electronically Signed   By: Acquanetta Belling M.D.   On: 11/03/2023 07:22   IR Fluoro Guide CV Line Right Result Date: 11/01/2023 INDICATION: ESRD requiring HD. EXAM: NON-TUNNELED CENTRAL VENOUS HEMODIALYSIS CATHETER PLACEMENT WITH ULTRASOUND AND FLUOROSCOPIC GUIDANCE COMPARISON:  CT AP 10/31/2023. MEDICATIONS: Local anesthetic was administered. FLUOROSCOPY TIME:  Fluoroscopic dose; 35 mGy COMPLICATIONS: None immediate. PROCEDURE: Informed written consent was obtained from the patient and/or patient's representative after a discussion of the risks, benefits, and alternatives to treatment. Questions  regarding the procedure were encouraged and answered. The RIGHT neck and chest were prepped with chlorhexidine in a sterile fashion, and a sterile drape was applied covering the operative field. Maximum barrier sterile technique with sterile gowns and gloves were used for the procedure. A timeout was performed prior to the initiation of the procedure. After the overlying soft tissues were anesthetized, a small venotomy incision was created and a micropuncture kit was utilized to access the external jugular vein. Real-time ultrasound guidance was utilized for vascular access including the acquisition of a permanent ultrasound image documenting patency of the accessed vessel. The microwire was utilized to measure appropriate catheter length. A stiff glidewire was advanced to the level of the IVC. Under fluoroscopic guidance, the venotomy was serially dilated, ultimately allowing placement of a 20 cm temporary Trialysis catheter with tip ultimately terminating within the superior aspect of the right atrium. Final catheter positioning was confirmed and documented with a spot radiographic image. The catheter aspirates and flushes normally. The catheter was flushed with appropriate volume heparin dwells. The catheter exit site was secured with a 2-0 Ethilon retention suture. A dressing was placed. The patient tolerated the procedure well without immediate post procedural complication. IMPRESSION: Successful placement of a RIGHT EXTERNAL jugular approach 20 cm non-tunneled dialysis catheter The tip of the catheter is positioned within the proximal RIGHT atrium. The catheter is ready for immediate use. PLAN: This catheter may be converted to a tunneled dialysis catheter at a later date as indicated. Roanna Banning, MD [facility] Radiology Electronically Signed   By: Roanna Banning M.D.   On: 11/01/2023 18:15   IR US Guide Vasc Access Right Result Date: 11/01/2023 INDICATION:  ESRD requiring HD. EXAM: NON-TUNNELED CENTRAL VENOUS HEMODIALYSIS CATHETER PLACEMENT WITH ULTRASOUND AND FLUOROSCOPIC GUIDANCE COMPARISON:  CT AP 10/31/2023. MEDICATIONS: Local anesthetic was administered. FLUOROSCOPY TIME:  Fluoroscopic dose; 35 mGy  COMPLICATIONS: None immediate. PROCEDURE: Informed written consent was obtained from the patient and/or patient's representative after a discussion of the risks, benefits, and alternatives to treatment. Questions regarding the procedure were encouraged and answered. The RIGHT neck and chest were prepped with chlorhexidine in a sterile fashion, and a sterile drape was applied covering the operative field. Maximum barrier sterile technique with sterile gowns and gloves were used for the procedure. A timeout was performed prior to the initiation of the procedure. After the overlying soft tissues were anesthetized, a small venotomy incision was created and a micropuncture kit was utilized to access the external jugular vein. Real-time ultrasound guidance was utilized for vascular access including the acquisition of a permanent ultrasound image documenting patency of the accessed vessel. The microwire was utilized to measure appropriate catheter length. A stiff glidewire was advanced to the level of the IVC. Under fluoroscopic guidance, the venotomy was serially dilated, ultimately allowing placement of a 20 cm temporary Trialysis catheter with tip ultimately terminating within the superior aspect of the right atrium. Final catheter positioning was confirmed and documented with a spot radiographic image. The catheter aspirates and flushes normally. The catheter was flushed with appropriate volume heparin dwells. The catheter exit site was secured with a 2-0 Ethilon retention suture. A dressing was placed. The patient tolerated the procedure well without immediate post procedural complication. IMPRESSION: Successful placement of a RIGHT EXTERNAL jugular approach 20 cm  non-tunneled dialysis catheter The tip of the catheter is positioned within the proximal RIGHT atrium. The catheter is ready for immediate use. PLAN: This catheter may be converted to a tunneled dialysis catheter at a later date as indicated. Roanna Banning, MD [facility] Radiology Electronically Signed   By: Roanna Banning M.D.   On: 11/01/2023 18:15   CT ABDOMEN PELVIS WO CONTRAST Result Date: 10/31/2023 CLINICAL DATA:  Diverticulitis, complication suspected. EXAM: CT ABDOMEN AND PELVIS WITHOUT CONTRAST TECHNIQUE: Multidetector CT imaging of the abdomen and pelvis was performed following the standard protocol without IV contrast. RADIATION DOSE REDUCTION: This exam was performed according to the departmental dose-optimization program which includes automated exposure control, adjustment of the mA and/or kV according to patient size and/or use of iterative reconstruction technique. COMPARISON:  10/27/2023. FINDINGS: Lower chest: Heart is enlarged and multi-vessel coronary artery calcifications are noted. Consolidation and patchy airspace disease is present at the lung bases bilaterally. Hepatobiliary: No focal liver abnormality is seen. Stones are present within the gallbladder. No biliary ductal dilatation. Pancreas: Unremarkable. No pancreatic ductal dilatation or surrounding inflammatory changes. Spleen: Normal in size without focal abnormality. Adrenals/Urinary Tract: The adrenal glands are within normal limits. Calcifications are present in the upper pole of the left kidney. There is a cyst in the lower pole of the right kidney. No hydronephrosis bilaterally. The bladder is unremarkable. Stomach/Bowel: The stomach is within normal limits. No bowel obstruction or pneumatosis is seen. Right hemicolectomy changes are noted. There is diffuse colonic wall thickening. Scattered diverticula are present along the colon. A few small foci of free air are noted in the  mesentery in the mid left abdomen, slightly increased from the previous exam. There is suggestion of contrast extravasation in the region. There is fine no definite abscess is seen. Vascular/Lymphatic: Aortic atherosclerosis. No enlarged abdominal or pelvic lymph nodes. Reproductive: No acute abnormality. Other: Mild ascites is noted in all 4 quadrants. A fat containing umbilical hernia is noted. There is mild anasarca. Musculoskeletal: The bony structures are stable. IMPRESSION: 1. Worsening  diverticulitis of the descending colon with perforation and increasing free air and suspected oral contrast extravasation in the mid left abdomen. No definite abscess is seen. Surgical consultation is recommended. 2. Mild ascites. 3. Diffuse colonic wall thickening which may be due to associated inflammatory changes. 4. Patchy airspace disease at the lung bases with bilateral lower lobe consolidation. 5. Cholelithiasis. 6. Aortic atherosclerosis. Critical Value/emergent results were called by telephone at the time of interpretation on 10/31/2023 at 11:59 pm to provider Dr. Antionette Char, who verbally acknowledged these results. Electronically Signed   By: Thornell Sartorius M.D.   On: 10/31/2023 23:59   CT ABDOMEN PELVIS WO CONTRAST Result Date: 10/27/2023 CLINICAL DATA:  Acute nonlocalized abdominal pain. Chills and fever. Vomiting and diarrhea. EXAM: CT ABDOMEN AND PELVIS WITHOUT CONTRAST TECHNIQUE: Multidetector CT imaging of the abdomen and pelvis was performed following the standard protocol without IV contrast. RADIATION DOSE REDUCTION: This exam was performed according to the departmental dose-optimization program which includes automated exposure control, adjustment of the mA and/or kV according to patient size and/or use of iterative reconstruction technique. COMPARISON:  CT abdomen and pelvis 04/19/2021 FINDINGS: Lower chest: Bibasilar atelectasis.  No acute abnormality. Hepatobiliary: Cholelithiasis without evidence of acute  cholecystitis. No biliary dilation. Unremarkable noncontrast appearance of the liver. Pancreas: Unremarkable. Spleen: Unremarkable. Adrenals/Urinary Tract: Normal adrenal glands. No urinary calculi or hydronephrosis. Unremarkable bladder. Stomach/Bowel: Stomach is within normal limits. No bowel obstruction. Colonic diverticulosis. Wall thickening, inflammatory stranding, and free fluid about the descending colon. Loosely organized fluid and gas about the inflamed descending colon (circa series 3/image 49-50) compatible with contained perforation. The adjacent jejunum is inflamed. Postoperative change about the colon with ileocolonic anastomosis in the right upper quadrant. Vascular/Lymphatic: Aortic atherosclerosis. No enlarged abdominal or pelvic lymph nodes. Reproductive: No acute abnormality. Other: Fat containing periumbilical hernia. Musculoskeletal: No acute fracture. IMPRESSION: 1. Acute diverticulitis of the descending colon with contained perforation. 2. Inflamed jejunum adjacent to the perforated diverticulitis. 3.  Aortic Atherosclerosis (ICD10-I70.0). Critical Value/emergent results were called by telephone at the time of interpretation on 10/27/2023 at 9:22 pm to provider [facility] , who verbally acknowledged these results. Electronically Signed   By: Minerva Fester M.D.   On: 10/27/2023 21:24    Signature  -   Susa Raring M.D on 11/10/2023 at 9:57 AM   -  To page go to [email or web address]

## 2023-11-10 NOTE — Progress Notes (Addendum)
Patient ID: [name], female   DOB: Feb 09, 1956, 68 y.o.   MRN: [id number] [facility] Progress Note   Assessment/ Plan:   1.  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  After initial refusal of wanting to pursue renal replacement therapy, she changed her mind and asked to start dialysis with the intention of undergoing surgery for contained perforation of acute diverticulitis.   - #2 HD 2/12.HD #3 11/08/23 - has non tunneled HD cath in for 6 days-  will need to be converted to [facility]  if she still wants to continue-  has not been possible due to elevated WBC - agree with pall care conversations- unclear if she would want to continue long-term dialysis- greatly appreciate pall care - HD tomorrow 2.  Acute diverticulitis with contained perforation of descending colon: On Zosyn and now status post exploratory laparotomy with Hartman's resection for perforated diverticulitis/peritonitis yesterday (2/8).  Surgical note reviewed from today along with wound assessment. - on TNA - needs dedicated line for it if not already 3.  Anion gap metabolic acidosis: Likely secondary to combination of acute kidney injury and ongoing infection, improved with dialysis. 4.  Anemia: Likely secondary to chronic illness, iron stores significantly low and yesterday started on intravenous iron for loading prior to starting ESA.  She is also status post PRBC transfusion- overall better  Subjective:    Sitting in chair, no needs.  Discussed that she is going to see "what happens" with her disease course.   Objective:   BP (!) 149/71   Pulse 81   Temp 98.1 F (36.7 C) (Oral)   Resp 15   Ht 5' 7.01" (1.702 m)   Wt 100.4 kg   SpO2 99%   BMI 34.66 kg/m   Intake/Output Summary (Last 24 hours) at 11/10/2023 1039 Last data filed at 11/10/2023 0743 Gross per 24 hour   Intake 1360.04 ml  Output 200 ml  Net 1160.04 ml   Weight change: 1 kg  Physical Exam: Gen: Uncomfortably resting in bed, NGT in place with LIS and bilious drainage CVS: Pulse regular rhythm, normal rate, S1 and S2 normal Resp: Clear to auscultation bilaterally with diminished breath sounds over bases, no rales Abd: Protuberant with colostomy bag in situ.  (Wound assessment noted on surgical note) Ext: Trace ankle edema.  No asterixis/myoclonic jerks noted  Imaging: IR Fluoro Guide CV Line Right Result Date: 11/08/2023 INDICATION: ESRD.  IV antibiotics. EXAM: ULTRASOUND AND FLUOROSCOPIC GUIDED PLACEMENT OF TUNNELED CENTRAL VENOUS "Powerline" CATHETER MEDICATIONS: The patient was on scheduled IV antibiotics. ANESTHESIA/SEDATION: Local anesthetic was administered. The patient was continuously monitored during the procedure by the interventional radiology nurse under my direct supervision. FLUOROSCOPY TIME:  Fluoroscopic dose; 3.8 mGy COMPLICATIONS: None immediate. PROCEDURE: Informed written consent was obtained from the patient and/or patient's representative after a discussion of the risks, benefits, and alternatives to treatment. Questions regarding the procedure were encouraged and answered. The LEFT neck and chest were prepped with chlorhexidine in a sterile fashion, and a sterile drape was applied covering the operative field. Maximum barrier sterile technique with sterile gowns and gloves were used for the procedure. A timeout was performed prior to the initiation of the procedure. After the overlying soft tissues were anesthetized, a small venotomy incision was created and a micropuncture kit was utilized to access the internal jugular vein. Real-time ultrasound guidance was utilized for vascular access  including the acquisition of a permanent ultrasound image documenting patency of the accessed vessel. The microwire was utilized to measure appropriate catheter length. The micropuncture sheath  was exchanged for a peel-away sheath over a guidewire. A 5 Fr dual lumen tunneled central venous catheter measuring 33 cm was tunneled in a retrograde fashion from the anterior chest wall to the venotomy incision. The catheter was then placed through the peel-away sheath with tip ultimately positioned at the superior caval-atrial junction. Final catheter positioning was confirmed and documented with a spot radiographic image. The catheter aspirates and flushes normally. The catheter was flushed with appropriate volume heparin dwells. The catheter exit site was secured with a 3-0 Ethilon retention suture. The venotomy incision was closed with Dermabond. Dressings were applied. The patient tolerated the procedure well without immediate post procedural complication. FINDINGS: After catheter placement, the tip lies within the superior apect of the right atrium. The catheter aspirates and flushes normally and is ready for immediate use. IMPRESSION: Successful placement of 33 cm dual lumen tunneled "Powerline" central venous catheter via the LEFT internal jugular vein The tip of the catheter is positioned within the proximal RIGHT atrium. The catheter is ready for immediate use. Roanna Banning, MD [facility] Radiology Electronically Signed   By: Roanna Banning M.D.   On: 11/08/2023 18:10   IR US Guide Vasc Access Left Result Date: 11/08/2023 INDICATION: ESRD.  IV antibiotics. EXAM: ULTRASOUND AND FLUOROSCOPIC GUIDED PLACEMENT OF TUNNELED CENTRAL VENOUS "Powerline" CATHETER MEDICATIONS: The patient was on scheduled IV antibiotics. ANESTHESIA/SEDATION: Local anesthetic was administered. The patient was continuously monitored during the procedure by the interventional radiology nurse under my direct supervision. FLUOROSCOPY TIME:  Fluoroscopic dose; 3.8 mGy COMPLICATIONS: None immediate. PROCEDURE: Informed written consent was obtained from the patient and/or patient's  representative after a discussion of the risks, benefits, and alternatives to treatment. Questions regarding the procedure were encouraged and answered. The LEFT neck and chest were prepped with chlorhexidine in a sterile fashion, and a sterile drape was applied covering the operative field. Maximum barrier sterile technique with sterile gowns and gloves were used for the procedure. A timeout was performed prior to the initiation of the procedure. After the overlying soft tissues were anesthetized, a small venotomy incision was created and a micropuncture kit was utilized to access the internal jugular vein. Real-time ultrasound guidance was utilized for vascular access including the acquisition of a permanent ultrasound image documenting patency of the accessed vessel. The microwire was utilized to measure appropriate catheter length. The micropuncture sheath was exchanged for a peel-away sheath over a guidewire. A 5 Fr dual lumen tunneled central venous catheter measuring 33 cm was tunneled in a retrograde fashion from the anterior chest wall to the venotomy incision. The catheter was then placed through the peel-away sheath with tip ultimately positioned at the superior caval-atrial junction. Final catheter positioning was confirmed and documented with a spot radiographic image. The catheter aspirates and flushes normally. The catheter was flushed with appropriate volume heparin dwells. The catheter exit site was secured with a 3-0 Ethilon retention suture. The venotomy incision was closed with Dermabond. Dressings were applied. The patient tolerated the procedure well without immediate post procedural complication. FINDINGS: After catheter placement, the tip lies within the superior apect of the right atrium. The catheter aspirates and flushes normally and is ready for immediate use. IMPRESSION: Successful placement of 33 cm dual lumen tunneled "Powerline" central venous catheter via the LEFT internal jugular  vein The tip  of the catheter is positioned within the proximal RIGHT atrium. The catheter is ready for immediate use. Roanna Banning, MD [facility] Radiology Electronically Signed   By: Roanna Banning M.D.   On: 11/08/2023 18:10     Labs: BMET Recent Labs  Lab 11/04/23 1455 11/05/23 0247 11/06/23 0345 11/07/23 0319 11/07/23 1431 11/08/23 0620 11/09/23 0958 11/10/23 0139  NA 136 135 134* 136 133* 134* 133* 137  K 3.9 3.9 3.8 3.9 4.1 3.1* 3.4* 3.4*  CL 96* 94* 93* 96* 97* 95* 95* 99  CO2 20* 21* 21* 20* 18* 22 25 24   GLUCOSE 224* 194* 254* 200* 216* 344* 358* 122*  BUN 81* 47* 62* 38* 45* 55* 38* 45*  CREATININE 9.18* 6.10* 7.45* 5.48* 6.34* 7.14* 5.43* 6.44*  CALCIUM 7.4* 7.7* 7.5* 7.6* 7.6* 7.4* 7.3* 7.3*  PHOS 7.0*  --   --   --  5.5* 4.6 2.6 2.3*   CBC Recent Labs  Lab 11/07/23 0319 11/08/23 0620 11/09/23 0958 11/10/23 0139  WBC 31.3* 27.8* 24.7* 21.5*  NEUTROABS 28.2* 22.9* 22.0* 15.9*  HGB 9.3* 9.2* 8.7* 7.9*  HCT 28.3* 28.2* 26.8* 24.5*  MCV 80.9 80.8 81.7 82.8  PLT 320 315 300 300    Medications:     sodium chloride   Intravenous Once   sodium chloride   Intravenous Once   alteplase  2 mg Intracatheter Once   carvedilol  3.125 mg Oral BID WC   Chlorhexidine Gluconate Cloth  6 each Topical Q0600   erythromycin  400 mg Oral Q6H   insulin aspart  0-20 Units Subcutaneous Q4H   lidocaine  20 mL Intradermal Once   mouth rinse  15 mL Mouth Rinse 4 times per day   pantoprazole (PROTONIX) IV  40 mg Intravenous Q12H   potassium chloride  20 mEq Oral Once   sodium chloride flush  10-40 mL Intracatheter Q12H   Delanee Xin  11/10/2023, 10:39 AM

## 2023-11-10 NOTE — Progress Notes (Signed)
Daily Progress Note   Patient Name: [name]       Date: 11/10/2023 DOB: [birth year]  Age: 68 y.o. MRN#: [id number] Attending Physician: Leroy Sea, MD Primary Care Physician: Hillery Aldo, NP Admit Date: 10/27/2023  Reason for Consultation/Follow-up: Establishing goals of care   Length of Stay: 14  Current Medications: Scheduled Meds:   sodium chloride   Intravenous Once   sodium chloride   Intravenous Once   carvedilol  3.125 mg Oral BID WC   Chlorhexidine Gluconate Cloth  6 each Topical Q0600   erythromycin  400 mg Oral Q6H   insulin aspart  0-20 Units Subcutaneous Q4H   lidocaine  20 mL Intradermal Once   mouth rinse  15 mL Mouth Rinse 4 times per day   pantoprazole (PROTONIX) IV  40 mg Intravenous Q12H   sodium chloride flush  10-40 mL Intracatheter Q12H    Continuous Infusions:  heparin 1,800 Units/hr (11/10/23 0602)   piperacillin-tazobactam (ZOSYN)  IV 2.25 g (11/10/23 1356)   TPN ADULT (ION) 40 mL/hr at 11/10/23 0602   TPN ADULT (ION)      PRN Meds: acetaminophen **OR** acetaminophen, albuterol, hydrALAZINE, HYDROmorphone (DILAUDID) injection, menthol-cetylpyridinium, ondansetron (ZOFRAN) IV, mouth rinse  Physical Exam Vitals reviewed.  Constitutional:      General: She is not in acute distress. HENT:     Head: Normocephalic and atraumatic.  Cardiovascular:     Rate and Rhythm: Normal rate.  Pulmonary:     Effort: Pulmonary effort is normal.  Abdominal:     Comments: colostomy  Skin:    General: Skin is warm and dry.  Neurological:     Mental Status: She is alert and oriented to person, place, and time.  Psychiatric:        Mood and Affect: Mood normal.        Behavior: Behavior normal.             Vital Signs: BP (!) 149/71   Pulse 81    Temp 98.1 F (36.7 C) (Oral)   Resp 15   Ht 5' 7.01" (1.702 m)   Wt 100.4 kg   SpO2 99%   BMI 34.66 kg/m  SpO2: SpO2: 99 % O2 Device: O2 Device: Nasal Cannula O2 Flow Rate: O2 Flow Rate (L/min): 2 L/min  Patient Active Problem List   Diagnosis Date Noted   Diverticulitis of large intestine with complication 10/27/2023   Chronic kidney disease, stage 5 (HCC) 10/27/2023   Chronic nonintractable headache 03/03/2021   Foot callus 02/27/2021   Counseled about COVID-19 virus infection 02/24/2020   Aortic heart murmur 05/15/2019   Thyroid enlargement 07/19/2014   Vitamin D deficiency 09/03/2013   Hyperlipidemia 04/24/2013   Malunion of fracture 03/17/2013   Wound infection after surgery 03/17/2013   Diabetic retinopathy (HCC) 01/23/2013   Chronic atrial fibrillation (HCC) 01/15/2013   CAD (coronary artery disease) 01/11/2013   H/O non-ST elevation myocardial infarction (NSTEMI) 01/09/2013   History of stroke 01/08/2013   Diabetic neuropathy 01/08/2013   Hypertension    Diabetes mellitus    Chronic kidney disease (CKD), stage IV (severe) [facility])     Palliative Care Assessment & Plan   Patient Profile: 68 y.o. female  with past medical history of CKD stage V, A-fib on Eliquis, DM type 2, CAD, and HTN who presented to the ED on 10/27/2023 with abdominal pain and was found to have acute diverticulitis with contained perforation involving the descending colon.  She also has AKI on CKD stage V, and BUN/creatinine is trending up.   Palliative Medicine has been consulted for goals of care and medical decision making.  Today's Discussion: Patient sitting up in bed in NAD. She shares that she is having some abdominal pain after sitting up several hours in the recliner. She shares that she received a dose of the prn IV pain medication but it did not seem to work as well. Looked at [facility] and saw the patient now has 0.5 mg IV dilaudid available down from the 1.0 mg of IV dilaudid she was  receiving earlier this week. She understands. Encouraged her to also utilize her prn tylenol. Notified RN of her pain. The patient did well yesterday with her liquid diet. The doctor is changing her diet order to soft foods. She is hesitant to progress her diet and plans to take it slow.  The patient shared that she is hopeful she will be able to get into inpatient rehab. She wants to be mobile again. She says that she is committed to giving her all to the current plan of care including dialysis. She says you never know what might happen-- this may all work out.  Encouraged her to call PMT with questions or concerns. PMT will continue to follow.  Recommendations/Plan: DNR Limited scope Continue current interventions PMT will continue to support   Code Status:    Code Status Orders  (From admission, onward)           Start     Ordered   11/01/23 1016  Do not attempt resuscitation (DNR) - Comfort care  Continuous       Question Answer Comment  If patient has no pulse and is not breathing Do Not Attempt Resuscitation   In Pre-Arrest Conditions (Patient Is Breathing and Has a Pulse) Provide comfort measures. Relieve any mechanical airway obstruction. Avoid transfer unless required for comfort.   Consent: Discussion documented in EHR or advanced directives reviewed      11/01/23 1016           Extensive chart review has been completed prior to seeing the patient including labs, vital signs, imaging, progress/consult notes, orders, medications, and available advance directive documents.  Care plan was discussed with bedside RN  Time: 55 minutes  Thank you for allowing the Palliative Medicine  Team to assist in the care of this patient.    Sherryll Burger, NP  Please contact Palliative Medicine Team phone at [phone] for questions and concerns.

## 2023-11-10 NOTE — Progress Notes (Addendum)
PHARMACY - TOTAL PARENTERAL NUTRITION CONSULT NOTE   Indication: Prolonged ileus  Patient Measurements: Height: 5' 7.01" (170.2 cm) Weight: 100.4 kg (221 lb 5.5 oz) IBW/kg (Calculated) : 61.62 TPN AdjBW (KG): 71.3 Body mass index is 34.66 kg/m. Usual Weight: 102.9 kg  Assessment: [name] who is s/p Hartmann's for sigmoid diverticulitis w/ perforation with prolonged ileus requiring TPN therapy. 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 HgbA1c 6.6 on 10/28/2023 -receiving 12u semglee on 2/15 AM Electrolytes: Na 137, K 3.4 (s/p KCL ), Cl 99, Ca 7.4 [CoCa 8.9], Mag 1.9, Phos 2.3 Renal: iHD - sched for 2/17 Hepatic: ALT/AST/ALP WNL, Tbili 1.6, Alb 2.1 Intake / Output; MIVF: UO 100 mL, Colostomy 0  mL charted GI Imaging: 2/12 CT abd shows 1. Interval colostomy with fluid collection along the left gutter,dominant pocket measuring 10 x 6 x 5 cm. 2. No bowel obstruction GI Surgeries / Procedures:  2/8-  POD 5, s/p Hartmann's for Sigmoid diverticulitis with perforation   Central access: 11/07/2023 TPN start date: 11/07/2023  Nutritional Goals: Goal TPN rate is 85 mL/hr (provides 122 g of protein and 2400 kcals per day)  RD Assessment: Estimated Needs Total Energy Estimated Needs: 2250-2450 Total Protein Estimated Needs: 120-135 grams Total Fluid Estimated Needs: 1000 ml + UOP  Current Nutrition:  TPN Soft diet 2/16  Plan:  Continue TPN at 51mL/hr at 1800 will provide 58 g protein and 1131 Kcal for today given surgical instruction to wean TPN   Electrolytes in TPN: Na 95 mEq/L, K 42 mEq/L, Ca 71mEq/L, Mg 99mEq/L, and Phos 76mmol/L. Cl:Ac 2:1; decrease to 30u insulin regular to the TPN  Continue standard MVI and trace elements to TPN Continue thiamine 100 mg IV in TPN x7 days (D3) Continue Resistant q4h SSI and adjust as needed  KCL PO x1  today  Monitor TPN labs on Mon/Thurs, and PRN   Calton Dach, PharmD, BCCCP Clinical Pharmacist 11/10/2023 7:17 AM

## 2023-11-10 NOTE — Progress Notes (Signed)
PHARMACY - ANTICOAGULATION CONSULT NOTE  Pharmacy Consult:  Heparin Indication: atrial fibrillation  Allergies  Allergen Reactions   Nsaids Other (See Comments)    CKD stage 4   Lisinopril Other (See Comments)    coughing   Peanut-Containing Drug Products Itching and Other (See Comments)    GI intolerance - diarrhea    Patient Measurements: Height: 5' 7.01" (170.2 cm) Weight: 100.4 kg (221 lb 5.5 oz) IBW/kg (Calculated) : 61.62 Heparin Dosing Weight: 83 kg  Vital Signs: Temp: 98.1 F (36.7 C) (02/16 0400) Temp Source: Oral (02/16 0400) BP: 131/60 (02/15 1949)  Labs: Recent Labs    11/08/23 0620 11/09/23 0958 11/10/23 0139  HGB 9.2* 8.7* 7.9*  HCT 28.2* 26.8* 24.5*  PLT 315 300 300  HEPARINUNFRC 0.51 0.52 0.59  CREATININE 7.14* 5.43* 6.44*    Estimated Creatinine Clearance: 10.2 mL/min (A) (by C-G formula based on SCr of 6.44 mg/dL (H)).  Medications:  PTA apixaban 2.5mg  PO BID (last dose 10/27/2023 AM)  Assessment: 60 yoF presents with abdominal pain with complicated diverticulitis w/ contained perforation and possible need for colectomy. PMH: CKD 5, A fib (apixaban), CAD, and CVA.  Pharmacy consulted to dose IV heparin for Afib. 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   Heparin level therapeutic at 0.59 with heparin running at 1800 u/h. Hgb stable in 7.9, PLT stable in 300s. No signs of bleeding or issues with heparin infusion noted.   Goal of Therapy:  Heparin level 0.3-0.7 units/ml Monitor platelets by anticoagulation protocol: Yes   Plan:  Continue heparin at 1800 units / hr Daily CBC and heparin level Monitor for S/sx of bleeding  Transition to home apixaban when appropriate   Roslyn Smiling, PharmD PGY1 Pharmacy Resident 11/10/2023 7:24 AM

## 2023-11-10 NOTE — Progress Notes (Signed)
8 Days Post-Op   Subjective/Chief Complaint:  Pt doing well this aM Tol PO  Some thicker osto output  Objective: Vital signs in last 24 hours: Temp:  [97.5 F (36.4 C)-98.7 F (37.1 C)] 98.1 F (36.7 C) (02/16 0400) Pulse Rate:  [78-86] 83 (02/15 1600) Resp:  [15-19] 15 (02/15 1600) BP: (120-172)/(59-141) 131/60 (02/15 1949) SpO2:  [99 %-100 %] 99 % (02/15 1600) Weight:  [100.4 kg] 100.4 kg (02/16 0453) Last BM Date : 11/09/23 (colostomy)  Intake/Output from previous day: 02/15 0701 - 02/16 0700 In: 1662.6 [I.V.:1549.9; IV Piggyback:112.7] Out: 100 [Urine:100] Intake/Output this shift: No intake/output data recorded.  PE:   Constitutional: No acute distress, conversant, appears states age. Eyes: Anicteric sclerae, moist conjunctiva, no lid lag Lungs: Clear to auscultation bilaterally, normal respiratory effort CV: regular rate and rhythm, no murmurs, no peripheral edema, pedal pulses 2+ GI: Soft, no masses or hepatosplenomegaly, non-tender to palpation. Ostomy functioning Skin: No rashes, palpation reveals normal turgor Psychiatric: appropriate judgment and insight, oriented to person, place, and time  Lab Results:  Recent Labs    11/09/23 0958 11/10/23 0139  WBC 24.7* 21.5*  HGB 8.7* 7.9*  HCT 26.8* 24.5*  PLT 300 300   BMET Recent Labs    11/09/23 0958 11/10/23 0139  NA 133* 137  K 3.4* 3.4*  CL 95* 99  CO2 25 24  GLUCOSE 358* 122*  BUN 38* 45*  CREATININE 5.43* 6.44*  CALCIUM 7.3* 7.3*   PT/INR No results for input(s): "LABPROT", "INR" in the last 72 hours. ABG No results for input(s): "PHART", "HCO3" in the last 72 hours.  Invalid input(s): "PCO2", "PO2"  Studies/Results: IR Fluoro Guide CV Line Right Result Date: 11/08/2023 INDICATION: ESRD.  IV antibiotics. EXAM: ULTRASOUND AND FLUOROSCOPIC GUIDED PLACEMENT OF TUNNELED CENTRAL VENOUS "Powerline" CATHETER MEDICATIONS: The patient was on scheduled IV antibiotics. ANESTHESIA/SEDATION: Local  anesthetic was administered. The patient was continuously monitored during the procedure by the interventional radiology nurse under my direct supervision. FLUOROSCOPY TIME:  Fluoroscopic dose; 3.8 mGy COMPLICATIONS: None immediate. PROCEDURE: Informed written consent was obtained from the patient and/or patient's representative after a discussion of the risks, benefits, and alternatives to treatment. Questions regarding the procedure were encouraged and answered. The LEFT neck and chest were prepped with chlorhexidine in a sterile fashion, and a sterile drape was applied covering the operative field. Maximum barrier sterile technique with sterile gowns and gloves were used for the procedure. A timeout was performed prior to the initiation of the procedure. After the overlying soft tissues were anesthetized, a small venotomy incision was created and a micropuncture kit was utilized to access the internal jugular vein. Real-time ultrasound guidance was utilized for vascular access including the acquisition of a permanent ultrasound image documenting patency of the accessed vessel. The microwire was utilized to measure appropriate catheter length. The micropuncture sheath was exchanged for a peel-away sheath over a guidewire. A 5 Fr dual lumen tunneled central venous catheter measuring 33 cm was tunneled in a retrograde fashion from the anterior chest wall to the venotomy incision. The catheter was then placed through the peel-away sheath with tip ultimately positioned at the superior caval-atrial junction. Final catheter positioning was confirmed and documented with a spot radiographic image. The catheter aspirates and flushes normally. The catheter was flushed with appropriate volume heparin dwells. The catheter exit site was secured with a 3-0 Ethilon retention suture. The venotomy incision was closed with Dermabond. Dressings were applied. The patient tolerated the procedure well without  immediate post procedural  complication. FINDINGS: After catheter placement, the tip lies within the superior apect of the right atrium. The catheter aspirates and flushes normally and is ready for immediate use. IMPRESSION: Successful placement of 33 cm dual lumen tunneled "Powerline" central venous catheter via the LEFT internal jugular vein The tip of the catheter is positioned within the proximal RIGHT atrium. The catheter is ready for immediate use. Roanna Banning, MD [facility] Electronically Signed   By: Roanna Banning M.D.   On: 11/08/2023 18:10   IR US Guide Vasc Access Left Result Date: 11/08/2023 INDICATION: ESRD.  IV antibiotics. EXAM: ULTRASOUND AND FLUOROSCOPIC GUIDED PLACEMENT OF TUNNELED CENTRAL VENOUS "Powerline" CATHETER MEDICATIONS: The patient was on scheduled IV antibiotics. ANESTHESIA/SEDATION: Local anesthetic was administered. The patient was continuously monitored during the procedure by the interventional radiology nurse under my direct supervision. FLUOROSCOPY TIME:  Fluoroscopic dose; 3.8 mGy COMPLICATIONS: None immediate. PROCEDURE: Informed written consent was obtained from the patient and/or patient's representative after a discussion of the risks, benefits, and alternatives to treatment. Questions regarding the procedure were encouraged and answered. The LEFT neck and chest were prepped with chlorhexidine in a sterile fashion, and a sterile drape was applied covering the operative field. Maximum barrier sterile technique with sterile gowns and gloves were used for the procedure. A timeout was performed prior to the initiation of the procedure. After the overlying soft tissues were anesthetized, a small venotomy incision was created and a micropuncture kit was utilized to access the internal jugular vein. Real-time ultrasound guidance was utilized for vascular access including the acquisition of a permanent ultrasound image documenting patency of the  accessed vessel. The microwire was utilized to measure appropriate catheter length. The micropuncture sheath was exchanged for a peel-away sheath over a guidewire. A 5 Fr dual lumen tunneled central venous catheter measuring 33 cm was tunneled in a retrograde fashion from the anterior chest wall to the venotomy incision. The catheter was then placed through the peel-away sheath with tip ultimately positioned at the superior caval-atrial junction. Final catheter positioning was confirmed and documented with a spot radiographic image. The catheter aspirates and flushes normally. The catheter was flushed with appropriate volume heparin dwells. The catheter exit site was secured with a 3-0 Ethilon retention suture. The venotomy incision was closed with Dermabond. Dressings were applied. The patient tolerated the procedure well without immediate post procedural complication. FINDINGS: After catheter placement, the tip lies within the superior apect of the right atrium. The catheter aspirates and flushes normally and is ready for immediate use. IMPRESSION: Successful placement of 33 cm dual lumen tunneled "Powerline" central venous catheter via the LEFT internal jugular vein The tip of the catheter is positioned within the proximal RIGHT atrium. The catheter is ready for immediate use. Roanna Banning, MD [facility] Rad[facility] Electronically Signed   By: Roanna Banning M.D.   On: 11/08/2023 18:10    Anti-infectives: Anti-infectives (From admission, onward)    Start     Dose/Rate Route Frequency Ordered Stop   11/08/23 1200  erythromycin (EES) 400 MG/5ML suspension 400 mg        400 mg Oral Every 6 hours 11/08/23 0815     11/06/23 0815  piperacillin-tazobactam (ZOSYN) IVPB 2.25 g        2.25 g 100 mL/hr over 30 Minutes Intravenous Every 8 hours 11/06/23 0729     11/01/23 1415  piperacillin-tazobactam (ZOSYN) IVPB 2.25 g  2.25 g 100 mL/hr over 30 Minutes  Intravenous Every 8 hours 11/01/23 1316 11/06/23 0602   10/29/23 1100  piperacillin-tazobactam (ZOSYN) IVPB 2.25 g  Status:  Discontinued        2.25 g 100 mL/hr over 30 Minutes Intravenous Every 8 hours 10/29/23 1006 11/01/23 1016   10/28/23 0600  piperacillin-tazobactam (ZOSYN) IVPB 2.25 g  Status:  Discontinued        2.25 g 100 mL/hr over 30 Minutes Intravenous Every 8 hours 10/27/23 2224 10/27/23 2235   10/28/23 0600  ceFEPIme (MAXIPIME) 1 g in sodium chloride 0.9 % 100 mL IVPB  Status:  Discontinued        1 g 200 mL/hr over 30 Minutes Intravenous Every 24 hours 10/27/23 2235 10/29/23 0954   10/28/23 0600  metroNIDAZOLE (FLAGYL) IVPB 500 mg  Status:  Discontinued        500 mg 100 mL/hr over 60 Minutes Intravenous Every 12 hours 10/27/23 2235 10/29/23 0954   10/27/23 2130  piperacillin-tazobactam (ZOSYN) IVPB 3.375 g        3.375 g 100 mL/hr over 30 Minutes Intravenous  Once 10/27/23 2128 10/27/23 2320       Assessment/Plan: POD 8, s/p Hartmann's by Dr. Freida Busman 11/02/23 forSigmoid diverticulitis with perforation  -WBC trending down;  duplex of LEs = neg -ADv diet to soft -therapies for mobilization. -VAC to midline wound, will change M/Th -WOC for colostomy care       FEN: soft diet, decrease TNA VTE: heparin gtt - hgb stable ID: cefepime/flagyl 2/3>2/4; Zosyn 2/4>>   - per TRH -  PAF  HTN HLD AKI on CKD stage V - on HD CAD T2DM  LOS: 14 days    Axel Filler 11/10/2023

## 2023-11-10 NOTE — Plan of Care (Signed)
   Problem: Education: Goal: Ability to describe self-care measures that may prevent or decrease complications (Diabetes Survival Skills Education) will improve Outcome: Progressing

## 2023-11-11 ENCOUNTER — Encounter (HOSPITAL_COMMUNITY): Payer: Self-pay

## 2023-11-11 DIAGNOSIS — K5732 Diverticulitis of large intestine without perforation or abscess without bleeding: Secondary | ICD-10-CM | POA: Diagnosis not present

## 2023-11-11 LAB — CBC WITH DIFFERENTIAL/PLATELET
Abs Immature Granulocytes: 0 10*3/uL (ref 0.00–0.07)
Basophils Absolute: 0 10*3/uL (ref 0.0–0.1)
Basophils Relative: 0 %
Eosinophils Absolute: 0.7 10*3/uL — ABNORMAL HIGH (ref 0.0–0.5)
Eosinophils Relative: 3 %
HCT: 24.8 % — ABNORMAL LOW (ref 36.0–46.0)
Hemoglobin: 8 g/dL — ABNORMAL LOW (ref 12.0–15.0)
Lymphocytes Relative: 2 %
Lymphs Abs: 0.5 10*3/uL — ABNORMAL LOW (ref 0.7–4.0)
MCH: 26.8 pg (ref 26.0–34.0)
MCHC: 32.3 g/dL (ref 30.0–36.0)
MCV: 83.2 fL (ref 80.0–100.0)
Monocytes Absolute: 2.5 10*3/uL — ABNORMAL HIGH (ref 0.1–1.0)
Monocytes Relative: 11 %
Neutro Abs: 18.9 10*3/uL — ABNORMAL HIGH (ref 1.7–7.7)
Neutrophils Relative %: 84 %
Platelets: 331 10*3/uL (ref 150–400)
RBC: 2.98 MIL/uL — ABNORMAL LOW (ref 3.87–5.11)
RDW: 16.7 % — ABNORMAL HIGH (ref 11.5–15.5)
WBC: 22.5 10*3/uL — ABNORMAL HIGH (ref 4.0–10.5)
nRBC: 0 % (ref 0.0–0.2)
nRBC: 0 /100{WBCs}

## 2023-11-11 LAB — COMPREHENSIVE METABOLIC PANEL
ALT: 17 U/L (ref 0–44)
AST: 23 U/L (ref 15–41)
Albumin: 1.7 g/dL — ABNORMAL LOW (ref 3.5–5.0)
Alkaline Phosphatase: 39 U/L (ref 38–126)
Anion gap: 13 (ref 5–15)
BUN: 55 mg/dL — ABNORMAL HIGH (ref 8–23)
CO2: 22 mmol/L (ref 22–32)
Calcium: 7.5 mg/dL — ABNORMAL LOW (ref 8.9–10.3)
Chloride: 102 mmol/L (ref 98–111)
Creatinine, Ser: 7.89 mg/dL — ABNORMAL HIGH (ref 0.44–1.00)
GFR, Estimated: 5 mL/min — ABNORMAL LOW (ref >60)
Glucose, Bld: 161 mg/dL — ABNORMAL HIGH (ref 70–99)
Potassium: 3.7 mmol/L (ref 3.5–5.1)
Sodium: 137 mmol/L (ref 135–145)
Total Bilirubin: 0.5 mg/dL (ref 0.0–1.2)
Total Protein: 5.8 g/dL — ABNORMAL LOW (ref 6.5–8.1)

## 2023-11-11 LAB — GLUCOSE, CAPILLARY
Glucose-Capillary: 137 mg/dL — ABNORMAL HIGH (ref 70–99)
Glucose-Capillary: 151 mg/dL — ABNORMAL HIGH (ref 70–99)
Glucose-Capillary: 120 mg/dL — ABNORMAL HIGH (ref 70–99)
Glucose-Capillary: 143 mg/dL — ABNORMAL HIGH (ref 70–99)
Glucose-Capillary: 163 mg/dL — ABNORMAL HIGH (ref 70–99)

## 2023-11-11 LAB — MAGNESIUM: Magnesium: 1.8 mg/dL (ref 1.7–2.4)

## 2023-11-11 LAB — PHOSPHORUS: Phosphorus: 2.2 mg/dL — ABNORMAL LOW (ref 2.5–4.6)

## 2023-11-11 LAB — HEPARIN LEVEL (UNFRACTIONATED): Heparin Unfractionated: 0.65 [IU]/mL (ref 0.30–0.70)

## 2023-11-11 LAB — PROCALCITONIN: Procalcitonin: 12.67 ng/mL

## 2023-11-11 LAB — TRIGLYCERIDES: Triglycerides: 148 mg/dL (ref <150)

## 2023-11-11 MED ORDER — OXYCODONE HCL 5 MG PO TABS
5.0000 mg | ORAL_TABLET | ORAL | Status: DC | PRN
  Administered 2023-11-11 – 2023-11-24 (×8): 10 mg via ORAL
  Filled 2023-11-11 (×8): qty 2

## 2023-11-11 MED ORDER — HEPARIN SODIUM (PORCINE) 1000 UNIT/ML IJ SOLN
INTRAMUSCULAR | Status: AC
  Administered 2023-11-11: 1000 [IU]
  Filled 2023-11-11: qty 3

## 2023-11-11 MED ORDER — INSULIN GLARGINE-YFGN 100 UNIT/ML ~~LOC~~ SOLN
6.0000 [IU] | Freq: Every day | SUBCUTANEOUS | Status: DC
  Administered 2023-11-11: 6 [IU] via SUBCUTANEOUS
  Filled 2023-11-11 (×2): qty 0.06

## 2023-11-11 MED ORDER — MAGNESIUM SULFATE 2 GM/50ML IV SOLN
2.0000 g | Freq: Once | INTRAVENOUS | Status: AC
  Administered 2023-11-11: 2 g via INTRAVENOUS
  Filled 2023-11-11: qty 50

## 2023-11-11 MED ORDER — K PHOS MONO-SOD PHOS DI & MONO 155-852-130 MG PO TABS
500.0000 mg | ORAL_TABLET | Freq: Once | ORAL | Status: AC
  Administered 2023-11-11: 500 mg via ORAL
  Filled 2023-11-11: qty 2

## 2023-11-11 MED ORDER — DARBEPOETIN ALFA 60 MCG/0.3ML IJ SOSY
60.0000 ug | PREFILLED_SYRINGE | INTRAMUSCULAR | Status: DC
  Administered 2023-11-11 – 2023-11-18 (×2): 60 ug via SUBCUTANEOUS
  Filled 2023-11-11 (×2): qty 0.3

## 2023-11-11 MED ORDER — INSULIN ASPART 100 UNIT/ML IJ SOLN
0.0000 [IU] | Freq: Every day | INTRAMUSCULAR | Status: DC
  Administered 2023-11-11: 3 [IU] via SUBCUTANEOUS
  Administered 2023-11-17: 2 [IU] via SUBCUTANEOUS

## 2023-11-11 MED ORDER — INSULIN ASPART 100 UNIT/ML IJ SOLN
0.0000 [IU] | Freq: Three times a day (TID) | INTRAMUSCULAR | Status: DC
  Administered 2023-11-11: 2 [IU] via SUBCUTANEOUS
  Administered 2023-11-12 – 2023-11-13 (×3): 3 [IU] via SUBCUTANEOUS
  Administered 2023-11-14: 1 [IU] via SUBCUTANEOUS
  Administered 2023-11-14: 2 [IU] via SUBCUTANEOUS
  Administered 2023-11-14 – 2023-11-16 (×2): 3 [IU] via SUBCUTANEOUS
  Administered 2023-11-17 – 2023-11-18 (×2): 2 [IU] via SUBCUTANEOUS
  Administered 2023-11-19: 3 [IU] via SUBCUTANEOUS
  Administered 2023-11-20 (×2): 2 [IU] via SUBCUTANEOUS
  Administered 2023-11-21: 5 [IU] via SUBCUTANEOUS
  Administered 2023-11-22 – 2023-11-23 (×2): 3 [IU] via SUBCUTANEOUS
  Administered 2023-11-23: 5 [IU] via SUBCUTANEOUS
  Administered 2023-11-24 – 2023-11-25 (×3): 3 [IU] via SUBCUTANEOUS

## 2023-11-11 MED ORDER — CARVEDILOL 6.25 MG PO TABS
6.2500 mg | ORAL_TABLET | Freq: Two times a day (BID) | ORAL | Status: DC
  Administered 2023-11-11 – 2023-11-23 (×20): 6.25 mg via ORAL
  Filled 2023-11-11 (×24): qty 1

## 2023-11-11 NOTE — Progress Notes (Signed)
OT Cancellation Note  Patient Details Name: [name] MRN: [id number] DOB: [birth year]   Cancelled Treatment:    Reason Eval/Treat Not Completed: Patient at procedure or test/ unavailable (HD)  Donia Pounds 11/11/2023, 12:05 PM

## 2023-11-11 NOTE — Progress Notes (Addendum)
Patient ID: [name], female   DOB: 08-19-1956, 68 y.o.   MRN: [id number] [facility] Progress Note   Assessment/ Plan:   1.  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  After initial refusal of wanting to pursue renal replacement therapy, she changed her mind and asked to start dialysis with the intention of undergoing surgery for contained perforation of acute diverticulitis.   -Maintain dialysis on MWF schedule -Plan for [facility] with IR today -Palliative care has been involved and patient wishes to continue with dialysis for now.  Will need outpatient dialysis arranged once she gets more stable -Would hold on permanent access creation for now given overall clinical picture 2.  Acute diverticulitis with contained perforation of descending colon: On Zosyn and now status post exploratory laparotomy with Hartman's resection for perforated diverticulitis/peritonitis (2/8).  Management per surgery and primary team 3.  Anion gap metabolic acidosis: Resolved with renal replacement therapy 4.  Anemia: Has received blood and IV iron. ESA also ordered  Subjective:    Patient feels well today with no complaints.  Planning for dialysis.  Met with palliative care yesterday and wants to continue current interventions.   Objective:   BP 129/67   Pulse 72   Temp 97.7 F (36.5 C) (Oral) Comment: pre treatment  Resp 14   Ht 5' 7.01" (1.702 m)   Wt 103.6 kg   SpO2 93%   BMI 35.76 kg/m   Intake/Output Summary (Last 24 hours) at 11/11/2023 1034 Last data filed at 11/11/2023 4098 Gross per 24 hour  Intake 10 ml  Output 350 ml  Net -340 ml   Weight change: -0.2 kg  Physical Exam: Gen: Lying in bed with no distress CVS: Normal rate, no rub Resp: Bilateral chest rise with no increased work of breathing Abd: Minimal distention, colostomy bag in  place Ext: Trace ankle edema.  Warm and well-perfused  Imaging: No results found.    Labs: BMET Recent Labs  Lab 11/04/23 1455 11/05/23 0247 11/06/23 0345 11/07/23 0319 11/07/23 1431 11/08/23 0620 11/09/23 0958 11/10/23 0139 11/11/23 0327  NA 136   < > 134* 136 133* 134* 133* 137 137  K 3.9   < > 3.8 3.9 4.1 3.1* 3.4* 3.4* 3.7  CL 96*   < > 93* 96* 97* 95* 95* 99 102  CO2 20*   < > 21* 20* 18* 22 25 24 22   GLUCOSE 224*   < > 254* 200* 216* 344* 358* 122* 161*  BUN 81*   < > 62* 38* 45* 55* 38* 45* 55*  CREATININE 9.18*   < > 7.45* 5.48* 6.34* 7.14* 5.43* 6.44* 7.89*  CALCIUM 7.4*   < > 7.5* 7.6* 7.6* 7.4* 7.3* 7.3* 7.5*  PHOS 7.0*  --   --   --  5.5* 4.6 2.6 2.3* 2.2*   < > = values in this interval not displayed.   CBC Recent Labs  Lab 11/08/23 0620 11/09/23 0958 11/10/23 0139 11/11/23 0327  WBC 27.8* 24.7* 21.5* 22.5*  NEUTROABS 22.9* 22.0* 15.9* 18.9*  HGB 9.2* 8.7* 7.9* 8.0*  HCT 28.2* 26.8* 24.5* 24.8*  MCV 80.8 81.7 82.8 83.2  PLT 315 300 300 331    Medications:     carvedilol  6.25 mg Oral BID WC   Chlorhexidine Gluconate Cloth  6 each Topical Q0600   erythromycin  400 mg Oral Q6H  insulin aspart  0-15 Units Subcutaneous TID WC   insulin aspart  0-5 Units Subcutaneous QHS   insulin glargine-yfgn  6 Units Subcutaneous Daily   mouth rinse  15 mL Mouth Rinse 4 times per day   pantoprazole (PROTONIX) IV  40 mg Intravenous Q12H   sodium chloride flush  10-40 mL Intracatheter Q12H   Darnell Level  11/11/2023, 10:34 AM

## 2023-11-11 NOTE — Plan of Care (Signed)
Problem: Education: Goal: Ability to describe self-care measures that may prevent or decrease complications (Diabetes Survival Skills Education) will improve 11/11/2023 0546 by Wilhelmina Mcardle, RN Outcome: Progressing 11/11/2023 0545 by Wilhelmina Mcardle, RN Outcome: Progressing Goal: Individualized Educational Video(s) 11/11/2023 0546 by Wilhelmina Mcardle, RN Outcome: Progressing 11/11/2023 0545 by Wilhelmina Mcardle, RN Outcome: Progressing   Problem: Coping: Goal: Ability to adjust to condition or change in health will improve 11/11/2023 0546 by Wilhelmina Mcardle, RN Outcome: Progressing 11/11/2023 0545 by Wilhelmina Mcardle, RN Outcome: Progressing   Problem: Fluid Volume: Goal: Ability to maintain a balanced intake and output will improve 11/11/2023 0546 by Wilhelmina Mcardle, RN Outcome: Progressing 11/11/2023 0545 by Wilhelmina Mcardle, RN Outcome: Progressing   Problem: Health Behavior/Discharge Planning: Goal: Ability to identify and utilize available resources and services will improve 11/11/2023 0546 by Wilhelmina Mcardle, RN Outcome: Progressing 11/11/2023 0545 by Wilhelmina Mcardle, RN Outcome: Progressing Goal: Ability to manage health-related needs will improve 11/11/2023 0546 by Wilhelmina Mcardle, RN Outcome: Progressing 11/11/2023 0545 by Wilhelmina Mcardle, RN Outcome: Progressing   Problem: Metabolic: Goal: Ability to maintain appropriate glucose levels will improve 11/11/2023 0546 by Wilhelmina Mcardle, RN Outcome: Progressing 11/11/2023 0545 by Wilhelmina Mcardle, RN Outcome: Progressing   Problem: Nutritional: Goal: Maintenance of adequate nutrition will improve 11/11/2023 0546 by Wilhelmina Mcardle, RN Outcome: Progressing 11/11/2023 0545 by Wilhelmina Mcardle, RN Outcome: Progressing Goal: Progress toward achieving an optimal weight will improve 11/11/2023 0546 by Wilhelmina Mcardle, RN Outcome: Progressing 11/11/2023 0545 by  Wilhelmina Mcardle, RN Outcome: Progressing   Problem: Skin Integrity: Goal: Risk for impaired skin integrity will decrease 11/11/2023 0546 by Wilhelmina Mcardle, RN Outcome: Progressing 11/11/2023 0545 by Wilhelmina Mcardle, RN Outcome: Progressing   Problem: Tissue Perfusion: Goal: Adequacy of tissue perfusion will improve 11/11/2023 0546 by Wilhelmina Mcardle, RN Outcome: Progressing 11/11/2023 0545 by Wilhelmina Mcardle, RN Outcome: Progressing   Problem: Education: Goal: Knowledge of General Education information will improve Description: Including pain rating scale, medication(s)/side effects and non-pharmacologic comfort measures 11/11/2023 0546 by Wilhelmina Mcardle, RN Outcome: Progressing 11/11/2023 0545 by Wilhelmina Mcardle, RN Outcome: Progressing   Problem: Health Behavior/Discharge Planning: Goal: Ability to manage health-related needs will improve 11/11/2023 0546 by Wilhelmina Mcardle, RN Outcome: Progressing 11/11/2023 0545 by Wilhelmina Mcardle, RN Outcome: Progressing   Problem: Clinical Measurements: Goal: Ability to maintain clinical measurements within normal limits will improve 11/11/2023 0546 by Wilhelmina Mcardle, RN Outcome: Progressing 11/11/2023 0545 by Wilhelmina Mcardle, RN Outcome: Progressing Goal: Will remain free from infection 11/11/2023 0546 by Wilhelmina Mcardle, RN Outcome: Progressing 11/11/2023 0545 by Wilhelmina Mcardle, RN Outcome: Progressing Goal: Diagnostic test results will improve 11/11/2023 0546 by Wilhelmina Mcardle, RN Outcome: Progressing 11/11/2023 0545 by Wilhelmina Mcardle, RN Outcome: Progressing Goal: Respiratory complications will improve 11/11/2023 0546 by Wilhelmina Mcardle, RN Outcome: Progressing 11/11/2023 0545 by Wilhelmina Mcardle, RN Outcome: Progressing Goal: Cardiovascular complication will be avoided 11/11/2023 0546 by Wilhelmina Mcardle, RN Outcome: Progressing 11/11/2023 0545 by Wilhelmina Mcardle, RN Outcome: Progressing   Problem: Activity: Goal: Risk for activity intolerance will decrease 11/11/2023 0546 by Wilhelmina Mcardle, RN Outcome: Progressing 11/11/2023 0545 by Wilhelmina Mcardle, RN Outcome: Progressing   Problem: Nutrition: Goal: Adequate nutrition will be maintained 11/11/2023 0546 by Wilhelmina Mcardle, RN Outcome: Progressing 11/11/2023 0545 by Wilhelmina Mcardle, RN Outcome: Progressing  Problem: Coping: Goal: Level of anxiety will decrease 11/11/2023 0546 by Wilhelmina Mcardle, RN Outcome: Progressing 11/11/2023 0545 by Wilhelmina Mcardle, RN Outcome: Progressing   Problem: Elimination: Goal: Will not experience complications related to bowel motility 11/11/2023 0546 by Wilhelmina Mcardle, RN Outcome: Progressing 11/11/2023 0545 by Wilhelmina Mcardle, RN Outcome: Progressing Goal: Will not experience complications related to urinary retention 11/11/2023 0546 by Wilhelmina Mcardle, RN Outcome: Progressing 11/11/2023 0545 by Wilhelmina Mcardle, RN Outcome: Progressing   Problem: Pain Managment: Goal: General experience of comfort will improve and/or be controlled 11/11/2023 0546 by Wilhelmina Mcardle, RN Outcome: Progressing 11/11/2023 0545 by Wilhelmina Mcardle, RN Outcome: Progressing   Problem: Safety: Goal: Ability to remain free from injury will improve 11/11/2023 0546 by Wilhelmina Mcardle, RN Outcome: Progressing 11/11/2023 0545 by Wilhelmina Mcardle, RN Outcome: Progressing   Problem: Skin Integrity: Goal: Risk for impaired skin integrity will decrease 11/11/2023 0546 by Wilhelmina Mcardle, RN Outcome: Progressing 11/11/2023 0545 by Wilhelmina Mcardle, RN Outcome: Progressing   Problem: Education: Goal: Knowledge of the prescribed therapeutic regimen will improve 11/11/2023 0546 by Wilhelmina Mcardle, RN Outcome: Progressing 11/11/2023 0545 by Wilhelmina Mcardle, RN Outcome: Progressing   Problem: Coping: Goal:  Ability to identify and develop effective coping behavior will improve 11/11/2023 0546 by Wilhelmina Mcardle, RN Outcome: Progressing 11/11/2023 0545 by Wilhelmina Mcardle, RN Outcome: Progressing   Problem: Clinical Measurements: Goal: Quality of life will improve 11/11/2023 0546 by Wilhelmina Mcardle, RN Outcome: Progressing 11/11/2023 0545 by Wilhelmina Mcardle, RN Outcome: Progressing   Problem: Respiratory: Goal: Verbalizations of increased ease of respirations will increase 11/11/2023 0546 by Wilhelmina Mcardle, RN Outcome: Progressing 11/11/2023 0545 by Wilhelmina Mcardle, RN Outcome: Progressing   Problem: Role Relationship: Goal: Family's ability to cope with current situation will improve 11/11/2023 0546 by Wilhelmina Mcardle, RN Outcome: Progressing 11/11/2023 0545 by Wilhelmina Mcardle, RN Outcome: Progressing Goal: Ability to verbalize concerns, feelings, and thoughts to partner or family member will improve 11/11/2023 0546 by Wilhelmina Mcardle, RN Outcome: Progressing 11/11/2023 0545 by Wilhelmina Mcardle, RN Outcome: Progressing   Problem: Pain Management: Goal: Satisfaction with pain management regimen will improve 11/11/2023 0546 by Wilhelmina Mcardle, RN Outcome: Progressing 11/11/2023 0545 by Wilhelmina Mcardle, RN Outcome: Progressing

## 2023-11-11 NOTE — Progress Notes (Signed)
Progress Note  9 Days Post-Op  Subjective: Pt reports some abdominal pain but not severe. Not great appetite but tolerating liquids. Having ostomy output. Seen in HD today   Objective: Vital signs in last 24 hours: Temp:  [97.7 F (36.5 C)-98.8 F (37.1 C)] 97.7 F (36.5 C) (02/17 0858) Pulse Rate:  [72-82] 72 (02/17 1008) Resp:  [14-21] 14 (02/17 1008) BP: (120-163)/(56-109) 129/67 (02/17 1008) SpO2:  [91 %-100 %] 93 % (02/17 1008) Weight:  [100.2 kg-103.6 kg] 103.6 kg (02/17 0900) Last BM Date : 11/11/23  Intake/Output from previous day: 02/16 0701 - 02/17 0700 In: 10 [I.V.:10] Out: 550 [Stool:550] Intake/Output this shift: No intake/output data recorded.  PE: General: pleasant, WD, obese female who is laying in bed in NAD Lungs: Respiratory effort nonlabored Abd: soft, appropriately ttp, VAC to midline with seal, ostomy functioning with stool in bag Psych: A&Ox3 with an appropriate affect.    Lab Results:  Recent Labs    11/10/23 0139 11/11/23 0327  WBC 21.5* 22.5*  HGB 7.9* 8.0*  HCT 24.5* 24.8*  PLT 300 331   BMET Recent Labs    11/10/23 0139 11/11/23 0327  NA 137 137  K 3.4* 3.7  CL 99 102  CO2 24 22  GLUCOSE 122* 161*  BUN 45* 55*  CREATININE 6.44* 7.89*  CALCIUM 7.3* 7.5*   PT/INR No results for input(s): "LABPROT", "INR" in the last 72 hours. CMP     Component Value Date/Time   NA 137 11/11/2023 0327   NA 137 02/28/2021 1548   K 3.7 11/11/2023 0327   CL 102 11/11/2023 0327   CO2 22 11/11/2023 0327   GLUCOSE 161 (H) 11/11/2023 0327   BUN 55 (H) 11/11/2023 0327   BUN 33 (H) 02/28/2021 1548   CREATININE 7.89 (H) 11/11/2023 0327   CREATININE 3.38 (H) 12/26/2015 1347   CALCIUM 7.5 (L) 11/11/2023 0327   PROT 5.8 (L) 11/11/2023 0327   PROT 7.0 11/07/2017 1414   ALBUMIN 1.7 (L) 11/11/2023 0327   ALBUMIN 4.0 11/07/2017 1414   AST 23 11/11/2023 0327   ALT 17 11/11/2023 0327   ALKPHOS 39 11/11/2023 0327   BILITOT 0.5 11/11/2023 0327    BILITOT 0.2 11/07/2017 1414   GFRNONAA 5 (L) 11/11/2023 0327   GFRNONAA 13 (L) 01/10/2015 1428   GFRAA 21 (L) 12/21/2019 1802   GFRAA 16 (L) 01/10/2015 1428   Lipase     Component Value Date/Time   LIPASE 26 10/27/2023 1936       Studies/Results: No results found.  Anti-infectives: Anti-infectives (From admission, onward)    Start     Dose/Rate Route Frequency Ordered Stop   11/08/23 1200  erythromycin (EES) 400 MG/5ML suspension 400 mg        400 mg Oral Every 6 hours 11/08/23 0815     11/06/23 0815  piperacillin-tazobactam (ZOSYN) IVPB 2.25 g        2.25 g 100 mL/hr over 30 Minutes Intravenous Every 8 hours 11/06/23 0729     11/01/23 1415  piperacillin-tazobactam (ZOSYN) IVPB 2.25 g        2.25 g 100 mL/hr over 30 Minutes Intravenous Every 8 hours 11/01/23 1316 11/06/23 0602   10/29/23 1100  piperacillin-tazobactam (ZOSYN) IVPB 2.25 g  Status:  Discontinued        2.25 g 100 mL/hr over 30 Minutes Intravenous Every 8 hours 10/29/23 1006 11/01/23 1016   10/28/23 0600  piperacillin-tazobactam (ZOSYN) IVPB 2.25 g  Status:  Discontinued  2.25 g 100 mL/hr over 30 Minutes Intravenous Every 8 hours 10/27/23 2224 10/27/23 2235   10/28/23 0600  ceFEPIme (MAXIPIME) 1 g in sodium chloride 0.9 % 100 mL IVPB  Status:  Discontinued        1 g 200 mL/hr over 30 Minutes Intravenous Every 24 hours 10/27/23 2235 10/29/23 0954   10/28/23 0600  metroNIDAZOLE (FLAGYL) IVPB 500 mg  Status:  Discontinued        500 mg 100 mL/hr over 60 Minutes Intravenous Every 12 hours 10/27/23 2235 10/29/23 0954   10/27/23 2130  piperacillin-tazobactam (ZOSYN) IVPB 3.375 g        3.375 g 100 mL/hr over 30 Minutes Intravenous  Once 10/27/23 2128 10/27/23 2320        Assessment/Plan  POD 9, s/p Hartmann's by Dr. Freida Busman 11/02/23 forSigmoid diverticulitis with perforation  - WBC stable at 22;  duplex of LEs = neg - NPO this AM for procedure - ok to have soft or regular diet after this from surgical  perspective  - therapies for mobilization. - VAC to midline wound, will change M/Th - WOC for colostomy care       FEN: soft diet, decrease TNA VTE: heparin gtt - hgb stable ID: cefepime/flagyl 2/3>2/4; Zosyn 2/4>>   - per TRH -  PAF  HTN HLD AKI on CKD stage V - on HD CAD T2DM   LOS: 15 days     Juliet Rude, [facility] Surgery 11/11/2023, 11:06 AM Please see Amion for pager number during day hours 7:00am-4:30pm

## 2023-11-11 NOTE — Progress Notes (Signed)
   11/11/23 1244  Vitals  Temp 97.8 F (36.6 C)  Pulse Rate 77  Resp (!) 21  BP (!) 141/67  SpO2 98 %  O2 Device Room Air  Weight (S)  98.5 kg (Bed Scale)  Type of Weight Post-Dialysis  Oxygen Therapy  Patient Activity (if Appropriate) In bed  Pulse Oximetry Type Continuous  Post Treatment  Dialyzer Clearance Clear  Hemodialysis Intake (mL) 0 mL  Liters Processed 54  Fluid Removed (mL) 1000 mL  Tolerated HD Treatment Yes  Post-Hemodialysis Comments Tx. Tolerated without difficulties, pt resting and voice no complaints. UF goal met and medication admin   Received patient in bed to unit.  Alert and oriented.  Informed consent signed and in chart.   TX duration: 3  Patient tolerated well.  Transported back to the room  Alert, without acute distress.  Hand-off given to patient's nurse.   Access used: Yes Access issues: No  Total UF removed: 1000 Medication(s) given: See MAR Post HD VS: See Above Grid Post HD weight: 98.5 kg   [facility]

## 2023-11-11 NOTE — Progress Notes (Addendum)
PHARMACY - ANTICOAGULATION CONSULT NOTE  Pharmacy Consult for Heparin Indication: atrial fibrillation  Allergies  Allergen Reactions   Nsaids Other (See Comments)    CKD stage 4   Lisinopril Other (See Comments)    coughing   Peanut-Containing Drug Products Itching and Other (See Comments)    GI intolerance - diarrhea    Patient Measurements: Height: 5' 7.01" (170.2 cm) Weight: 100.2 kg (220 lb 14.4 oz) IBW/kg (Calculated) : 61.62 Heparin Dosing Weight: 83 kg  Vital Signs: Temp: 97.8 F (36.6 C) (02/17 0730) Temp Source: Oral (02/17 0730) BP: 156/60 (02/17 0730) Pulse Rate: 77 (02/17 0730)  Labs: Recent Labs    11/09/23 0958 11/10/23 0139 11/11/23 0327  HGB 8.7* 7.9* 8.0*  HCT 26.8* 24.5* 24.8*  PLT 300 300 331  HEPARINUNFRC 0.52 0.59 0.65  CREATININE 5.43* 6.44* 7.89*    Estimated Creatinine Clearance: 8.3 mL/min (A) (by C-G formula based on SCr of 7.89 mg/dL (H)). - new to HD this admit  Assessment: 87 yoF presents with abdominal pain with complicated diverticulitis w/ contained perforation and possible need for colectomy. PMH: CKD 5, A fib (apixaban), CAD, and CVA.  Pharmacy consulted to dose IV heparin for Afib. 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   Last apixaban dose 2/2 PTA.  Heparin level remains therapeutic (0.65) on 1800 units/hr. CBC stable. No bleeding reported.   Goal of Therapy:  Heparin level 0.3-0.7 units/ml Monitor platelets by anticoagulation protocol: Yes   Plan:  Continue heparin drip at 1800 units/hr. Daily heparin level and CBC. Monitor for signs/symptoms of bleeding. Transition back to home Apixaban when able.   Addendum: 3pm: To transition to Eliquis after 8pm tonight per Dr. Thedore Mins.  Planning TDC in IR today.  Will follow up tonight. If procedure postponed, will postpone change to Eliquis.  Dennie Fetters, RPh 11/11/2023,8:13 AM Addendum: 3:00 PM

## 2023-11-11 NOTE — Progress Notes (Signed)
PHARMACY - TOTAL PARENTERAL NUTRITION CONSULT NOTE   Indication: Prolonged ileus  Patient Measurements: Height: 5' 7.01" (170.2 cm) Weight: 100.2 kg (220 lb 14.4 oz) IBW/kg (Calculated) : 61.62 TPN AdjBW (KG): 71.3 Body mass index is 34.59 kg/m. Usual Weight: 102.9 kg  Assessment: 66 YOF who is s/p Hartmann's for sigmoid diverticulitis w/ perforation with prolonged ileus requiring TPN therapy. She has a PMH positive for CKD-V currently on iHD and T2DM  Glucose / Insulin: CBG's <200, received 17 units SSI in past 24 hours plus 30u in TPN. HgbA1c 6.6 on 10/28/2023 - Received 12u semglee on 2/15 AM Electrolytes: Na 137, K 3.4 (s/p KCL ), Cl 99, Ca 7.4 [CoCa 8.9], Mag 1.9, Phos 2.3 Renal: iHD - sched for 2/17 Hepatic: ALT/AST/ALP WNL, Tbili 1.6, Alb 2.1 Intake / Output; MIVF: UO 100 mL, Colostomy 0  mL charted GI Imaging: 2/12 CT abd shows 1. Interval colostomy with fluid collection along the left gutter,dominant pocket measuring 10 x 6 x 5 cm. 2. No bowel obstruction GI Surgeries / Procedures:  2/8-  POD 5, s/p Hartmann's for Sigmoid diverticulitis with perforation   Central access: 11/07/2023 TPN start date: 11/07/2023  Nutritional Goals: Goal TPN rate is 85 mL/hr (provides 122 g of protein and 2400 kcals per day)  RD Assessment: Estimated Needs Total Energy Estimated Needs: 2250-2450 Total Protein Estimated Needs: 120-135 grams Total Fluid Estimated Needs: 1000 ml + UOP  Current Nutrition:  TPN Soft diet 2/16  Plan:  Stop TPN now per MD d/t risk of infection  CBGs fairly controlled prior to TPN starting > insulin adjustments per MD  Rexford Maus, PharmD, BCPS 11/11/2023 7:32 AM

## 2023-11-11 NOTE — Consult Note (Signed)
Chief Complaint: Renal failure needing hemodialysis  Referring Provider(s): Bufford Buttner  Supervising Physician: Richarda Overlie  Patient Status: [facility] - In-pt  History of Present Illness: [name] is a 68 y.o. 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    She had previously refused hemodialysis.  She changed her mind and asked to start dialysis with the intention of undergoing surgery for contained perforation of acute diverticulitis.    She currently has a temporary HD cath placed by Dr. Milford Cage on 11/01/23.  Her WBC has been consistently in the 20's despite antibiotic therapy.  She remains afebrile.  We have intentionally postponed placement of her tunneled HD cath due to her leukocytosis, however it really has not changed much despite therapy.  In discussions with Dr. Lowella Dandy and other providers involved in her care, it has been decided to proceed with placement of the tunneled HD catheter.  Patient is a DNR.  Past Medical History:  Diagnosis Date   Anemia    Arthritis    Atrial fibrillation (HCC)    CAD (coronary artery disease)    Chronic kidney disease, stage III (moderate) (HCC)    Constipation    Diabetes mellitus (HCC)    History of blood transfusion    HLD (hyperlipidemia)    Hypertension    Myocardial infarction [facility])    in April 2014    Past Surgical History:  Procedure Laterality Date   ANKLE SURGERY Right    x 6 total   BREAST BIOPSY Left    15 or 20 yearsa ago she cant remember laterality    HARDWARE REMOVAL Right 02/13/2013   Procedure: HARDWARE REMOVAL;  Surgeon: Nadara Mustard, MD;  Location: [facility] OR;  Service: Orthopedics;  Laterality: Right;  Right Ankle Removal Hardware, Debridement, Place Wound VAC   IR FLUORO GUIDE CV LINE RIGHT  11/01/2023   IR FLUORO GUIDE CV LINE RIGHT  11/08/2023   IR US GUIDE VASC ACCESS LEFT  11/08/2023   IR US GUIDE VASC ACCESS RIGHT  11/01/2023   laser  surgery for diabetic retinopathy Bilateral    LEFT HEART CATHETERIZATION WITH CORONARY ANGIOGRAM N/A 01/09/2013   Procedure: LEFT HEART CATHETERIZATION WITH CORONARY ANGIOGRAM;  Surgeon: Dolores Patty, MD;  Location[facility] CATH LAB;  Service: Cardiovascular;  Laterality: N/A;   ORIF ANKLE FRACTURE Right 01/12/2013   Procedure: OPEN REDUCTION INTERNAL FIXATION (ORIF) ANKLE FRACTURE;  Surgeon: Nadara Mustard, MD;  Location: [facility] OR;  Service: Orthopedics;  Laterality: Right;   PARTIAL COLECTOMY N/A 11/02/2023   Procedure: OPEN PARTIAL COLECTOMY WITH END COLOSTOMY;  Surgeon: Fritzi Mandes, MD;  Location: [facility] OR;  Service: General;  Laterality: N/A;   PARTIAL HYSTERECTOMY      Allergies: Nsaids, Lisinopril, and Peanut-containing drug products  Medications: Prior to Admission medications   Medication Sig Start Date End Date Taking? Authorizing Provider  amLODipine (NORVASC) 5 MG tablet Take 5 mg by mouth daily. 07/29/21  Yes [provider]  apixaban (ELIQUIS) 2.5 MG TABS tablet Take 1 tablet (2.5 mg total) by mouth 2 (two) times daily. Patient taking differently: Take 1.25 mg by mouth 2 (two) times daily. 10/04/22  Yes Parke Poisson, MD  Ascorbic Acid (VITAMIN C PO) Take 1 tablet by mouth daily.   Yes [provider]  Blood Glucose Monitoring Suppl (ACCU-CHEK AVIVA PLUS) w/Device KIT CHECK BLOOD GLUCOSE THREE TIMES DAILY 10/26/20  Yes Autry-Lott, Randa Evens, DO  calcitRIOL (ROCALTROL) 0.25 MCG  capsule TAKE 1 CAPSULE (0.25 MCG TOTAL) EVERY MONDAY, WEDNESDAY, AND FRIDAY Patient taking differently: Take 0.25 mcg by mouth in the morning and at bedtime. 06/22/22  Yes Levin Erp, MD  carvedilol (COREG) 12.5 MG tablet TAKE 1 TABLET (12.5 MG TOTAL) BY MOUTH 2 (TWO) TIMES DAILY WITH A MEAL. 12/22/21  Yes Autry-Lott, Randa Evens, DO  furosemide (LASIX) 40 MG tablet Take 40 mg by mouth 2 (two) times daily. 08/12/18  Yes [provider]  hydrALAZINE (APRESOLINE) 50 MG tablet Take 50 mg  by mouth 3 (three) times daily. 09/29/18  Yes [provider]  insulin glargine (LANTUS) 100 UNIT/ML injection Inject 0.1-0.15 mLs (10-15 Units total) into the skin daily. Please schedule appointment before next refill. 04/05/22  Yes Erick Alley, DO  insulin regular (NOVOLIN R) 100 units/mL injection Inject 0.05-0.1 mLs (5-10 Units total) into the skin 3 (three) times daily before meals. 10/26/20  Yes Autry-Lott, Randa Evens, DO  isosorbide mononitrate (IMDUR) 30 MG 24 hr tablet Make apt BEFORE next refill Patient taking differently: Take 30 mg by mouth daily. Make apt BEFORE next refill 11/23/22  Yes Erick Alley, DO  latanoprost (XALATAN) 0.005 % ophthalmic solution Place 1 drop into both eyes at bedtime.   Yes [provider]  melatonin 5 MG TABS Take 10 mg by mouth at bedtime as needed (for insomnia).   Yes [provider]  Multiple Vitamin (MULTIVITAMIN WITH MINERALS) TABS Take 1 tablet by mouth daily.   Yes [provider]  VITAMIN D, CHOLECALCIFEROL, PO Take 1 tablet by mouth daily.   Yes [provider]  ACCU-CHEK AVIVA PLUS test strip CHECK BLOOD GLUCOSE THREE TIMES DAILY 11/05/22   Erick Alley, DO  DROPLET INSULIN SYRINGE 31G X 5/16" 0.5 ML MISC USE FOUR TIMES DAILY FOR INJECTIONS 09/03/22   Erick Alley, DO  Insulin Syringes, Disposable, U-100 0.5 ML MISC 4x daily injections 03/21/18   Tillman Sers, DO  Lancet Devices (ACCU-CHEK SOFTCLIX) lancets Fill for 1 month for TID testing. Use as instructed 11/13/13   Leona Singleton, MD  NEEDLE, DISP, 30 G (BD DISP NEEDLES) 30G X 1/2" MISC For 4x daily injections 11/05/15   Pincus Large, DO  REPATHA SURECLICK 140 MG/ML SOAJ Inject 140 mg into the skin every 14 (fourteen) days. Patient not taking: Reported on 10/27/2023 09/16/23   [provider]     Family History  Problem Relation Age of Onset   Diabetes Mother        No history CAD   Colon cancer Mother 24       died at 37 from CRC    Hypertension Father        Also had CAD   Heart disease Father    Cervical cancer Sister    Diabetes Sister    Congestive Heart Failure Sister    Diabetes Brother    Kidney disease Brother    Heart disease Maternal Grandmother    Sickle cell anemia Son    Thyroid disease Son     Social History   Socioeconomic History   Marital status: Legally Separated    Spouse name: Not on file   Number of children: 4   Years of education: Not on file   Highest education level: Not on file  Occupational History   Occupation: Disabled   Occupation: disabled  Tobacco Use   Smoking status: Never   Smokeless tobacco: Never  Vaping Use   Vaping status: Never Used  Substance and Sexual  Activity   Alcohol use: No   Drug use: No   Sexual activity: Not Currently  Other Topics Concern   Not on file  Social History Narrative   Patient lives with her son..   Social Drivers of Health   Financial Resource Strain: Low Risk  (08/25/2021)   Received from [facility]   Overall Financial Resource Strain (CARDIA)    Difficulty of Paying Living Expenses: Not hard at all  Food Insecurity: No Food Insecurity (10/28/2023)   Hunger Vital Sign    Worried About Running Out of Food in the Last Year: Never true    Ran Out of Food in the Last Year: Never true  Transportation Needs: No Transportation Needs (10/28/2023)   PRAPARE - Administrator, Civil Service (Medical): No    Lack of Transportation (Non-Medical): No  Physical Activity: Insufficiently Active (06/20/2021)   Received from [facility], [facility]   Exercise Vital Sign    Days of Exercise per Week: 1 day    Minutes of Exercise per Session: 20 min  Stress: No Stress Concern Present (08/25/2021)   Received from [facility], [facility] Occupational [facility] - Occupational Stress Questionnaire    Feeling of Stress : Not at all  Social Connections: Socially Isolated (10/28/2023)   Social  Connection and Isolation Panel [NHANES]    Frequency of Communication with Friends and Family: Twice a week    Frequency of Social Gatherings with Friends and Family: Never    Attends Religious Services: Never    Database administrator or Organizations: No    Attends Banker Meetings: Never    Marital Status: Separated     Review of Systems: A 12 point ROS discussed and pertinent positives are indicated in the HPI above.  All other systems are negative.   Vital Signs: BP (!) 141/67   Pulse 77   Temp 97.8 F (36.6 C)   Resp (!) 21   Ht 5' 7.01" (1.702 m)   Wt (S) 217 lb 2.5 oz (98.5 kg) Comment: Bed Scale  SpO2 98%   BMI 34.00 kg/m   Advance Care Plan: The advanced care place/surrogate decision maker was discussed at the time of visit and the patient did not wish to discuss or was not able to name a surrogate decision maker or provide an advance care plan.  Physical Exam Vitals reviewed.  Constitutional:      Appearance: Normal appearance.  Cardiovascular:     Rate and Rhythm: Normal rate and regular rhythm.  Pulmonary:     Effort: Pulmonary effort is normal. No respiratory distress.     Breath sounds: Normal breath sounds.  Abdominal:     Palpations: Abdomen is soft.  Neurological:     General: No focal deficit present.     Mental Status: She is alert and oriented to person, place, and time.  Psychiatric:        Mood and Affect: Mood normal.        Behavior: Behavior normal.        Thought Content: Thought content normal.        Judgment: Judgment normal.     Labs:  CBC: Recent Labs    11/08/23 0620 11/09/23 0958 11/10/23 0139 11/11/23 0327  WBC 27.8* 24.7* 21.5* 22.5*  HGB 9.2* 8.7* 7.9* 8.0*  HCT 28.2* 26.8* 24.5* 24.8*  PLT 315 300 300 331    COAGS: Recent Labs  10/28/23 0522 10/29/23 1806 10/30/23 0501 10/30/23 1427 10/30/23 1836 10/31/23 0503  INR >10.0*  --   --   --   --   --   APTT  --    < > 39* >200* 54* 76*   < > =  values in this interval not displayed.    BMP: Recent Labs    11/08/23 0620 11/09/23 0958 11/10/23 0139 11/11/23 0327  NA 134* 133* 137 137  K 3.1* 3.4* 3.4* 3.7  CL 95* 95* 99 102  CO2 22 25 24 22   GLUCOSE 344* 358* 122* 161*  BUN 55* 38* 45* 55*  CALCIUM 7.4* 7.3* 7.3* 7.5*  CREATININE 7.14* 5.43* 6.44* 7.89*  GFRNONAA 6* 8* 7* 5*    LIVER FUNCTION TESTS: Recent Labs    10/27/23 1936 10/29/23 0725 11/02/23 0133 11/03/23 0310 11/04/23 1455 11/07/23 1431 11/11/23 0327  BILITOT 1.0  --  1.2  --   --  1.6* 0.5  AST 17  --  8*  --   --  16 23  ALT 9  --  9  --   --  14 17  ALKPHOS 44  --  29*  --   --  47 39  PROT 7.3  --  6.0*  --   --  6.3* 5.8*  ALBUMIN 3.4*   < > 2.0* 2.1* 1.8* 2.1* 1.7*   < > = values in this interval not displayed.    TUMOR MARKERS: No results for input(s): "AFPTM", "CEA", "CA199", "CHROMGRNA" in the last 8760 hours.  Assessment and Plan:  End stage renal disease needing hemodialysis.  Will proceed with image guided placement of a tunneled hemodialysis catheter today by Dr. Lowella Dandy.  Risks and benefits discussed with the patient including, but not limited to bleeding, infection, vascular injury, pneumothorax which may require chest tube placement, air embolism or even death  All of the patient's questions were answered, patient is agreeable to proceed. Consent signed and in chart.  Patient currently has DNR order in place. Discussion with the patient and family regarding wishes.  The original DNR order is maintained and prior treatment limitations are upheld during the procedure.  Thank you for allowing our service to participate in ARIZA EVANS 's care.  Electronically Signed: Gwynneth Macleod, PA-C   11/11/2023, 12:52 PM      I spent a total of    15 Minutes in face to face in clinical consultation, greater than 50% of which was counseling/coordinating care for HD cath placement.

## 2023-11-11 NOTE — Progress Notes (Signed)
PROGRESS NOTE        PATIENT DETAILS Name: [name] Age: 68 y.o. Sex: female Date of Birth: Sep 07, 1956 Admit Date: 10/27/2023 Admitting Physician Dolly Rias, MD ZOX:[id number], Devonne Doughty, NP  Brief Summary: Patient is a 68 y.o.  female with history of CKD 5, A-fib on Eliquis, CAD-who presented with abdominal pain-found to have acute diverticulitis with contained perforation involving the descending colon.  Hospital course complicated by worsening AKI-thought to have progressed to ESRD-requiring initiation of HD.  See below for further details.  Significant events: 2/2>> admit to [facility] 2/6>> CT abdomen worsening diverticulitis with perforation 2/7>> transitioned to full comfort measures-as with worsening AKI-however multiple family members arrived-comfort care revoked-now full code-full scope of treatment including HD/laparotomy with colostomy.  General surgery/nephrology reconsulted.  IR placed [facility] and patient underwent first hemodialysis.  2/8>> remains n.p.o-colectomy/colostomy later today.  Significant studies: 2/2>> CT abdomen/pelvis: Acute diverticulitis-descending colon with contained perforation 2/6>> CT abdomen/pelvis: Worsening diverticulitis of descending colon with perforation and increasing failure-suspected oral contrast extravasation left abdomen.  Significant microbiology data: 2/2>> COVID/influenza/RSV PCR: Negative  Procedures: 2/8>> by Dr. Freida Busman underwent exploratory laparotomy with Hartman's resection for perforated diverticulitis with diffuse peritonitis, colostomy in place. 2/13 - R IJ Trialysis catheter - IR 2/14 -left IJ Central line placed by IR for TNA 11/11/2023.  Due for tunneled HD catheter placement by IR  Consults: Surgery Renal Palliative IR  Subjective: Patient in bed, appears comfortable, denies any headache, no fever, no chest pain or pressure, no shortness of breath , no abdominal pain. No focal  weakness.   Objective: Vitals: Blood pressure (!) 163/67, pulse 72, temperature 97.7 F (36.5 C), temperature source Oral, resp. rate (!) 21, height 5' 7.01" (1.702 m), weight 103.6 kg, SpO2 94%.   Exam:  Awake Alert, No new F.N deficits, dark bilious secretion, colostomy bag in place, right IJ HD catheter,   [place].AT,[name], [name],   Symmetrical Chest wall movement, Good air movement bilaterally, CTAB RRR,No Gallops, Rubs or new Murmurs,  +ve B.Sounds, Abd Soft, No tenderness,   No Cyanosis, Clubbing or edema    Assessment/Plan:  Acute diverticulitis with contained perforation of the descending colon - Initially was managed conservatively, subsequently 2/8>> by Dr. Freida Busman general surgery she underwent exploratory laparotomy with Hartman's resection for perforated diverticulitis with diffuse peritonitis, colostomy in place, continue Zosyn, continue NG tube, defer further management of this issue to General surgery Case discussed with general surgery team on 11/05/2023, monitor closely on present treatment, does have rising leukocytosis but afebrile, repeat CT noted, case discussed with general surgery for now wait and watch and monitor.  An IV TNA started on 11/07/2023 evening, NG tube discontinued by general surgery on 11/09/2023, she has shown clinical improvement, has tolerated clear liquid diet well on 11/09/2023, advance to soft diet on 11/10/2023.  If tolerates.  TNA on 11/11/2023.  She has been afebrile for over 5 days, inflammatory markers trending down, appears nontoxic.   AKI on CKD 5 with progression to ESRD AKI likely hemodynamically mediated-has now likely progressed to ESRD.  HD initiated via right IJ temporary catheter, switched to right IJ trialysis catheter by IR on 11/07/2023.  CAD Currently no anginal symptoms Suspect not on antiplatelets as on anticoagulation with Eliquis/heparin Continue beta-blocker On Repatha injections as an outpatient.  PAF Sinus  rhythm Continue Coreg Currently on heparin  drip with caution.  Anemia of acute illness  Brought on by acute illness, underlying renal insufficiency now developing to ESRD, surgical blood loss, gradually trending down, 2 units of packed RBC on 11/04/2023, on IV PPI. CBC looks stable on 11/05/2023 will continue to monitor closely.  HTN BP stable-placed on Coreg/amlodipine.  DM-2 (A1c 6.6 on 2/3) On TNA and now on clear liquids, every 4 moderate sliding scale, low-dose Semglee added on 11/09/2023.  Monitor with caution.  Recent Labs    11/10/23 2318 11/11/23 0321 11/11/23 0730  GLUCAP 151* 137* 151*    Palliative care/goals of care Initially DNR-and wanted to do comfort care, now is full code wants to undergo aggressive treatment including surgery and dialysis.  Full code now.  Class 1 Obesity: Estimated body mass index is 35.76 kg/m as calculated from the following:   Height as of this encounter: 5' 7.01" (1.702 m).   Weight as of this encounter: 103.6 kg.    Code status:   Code Status: Limited: Do not attempt resuscitation (DNR) -DNR-LIMITED -Do Not Intubate/DNI    DVT Prophylaxis: Place and maintain sequential compression device Start: 11/06/23 0613 IV Heparin  Family Communication:  Verlene Mayer updated 2/7  Disposition Plan: Status is: Inpatient Remains inpatient appropriate because: Severity of illness   Planned Discharge Destination:Residential hospice   Diet: Diet Order             Diet full liquid Fluid consistency: Thin  Diet effective now                   MEDICATIONS: Scheduled Meds:  carvedilol  6.25 mg Oral BID WC   Chlorhexidine Gluconate Cloth  6 each Topical Q0600   erythromycin  400 mg Oral Q6H   insulin aspart  0-20 Units Subcutaneous Q4H   mouth rinse  15 mL Mouth Rinse 4 times per day   pantoprazole (PROTONIX) IV  40 mg Intravenous Q12H   sodium chloride flush  10-40 mL Intracatheter Q12H   Continuous Infusions:  heparin  1,800 Units/hr (11/11/23 0519)   piperacillin-tazobactam (ZOSYN)  IV 2.25 g (11/11/23 0623)   TPN ADULT (ION) 40 mL/hr at 11/10/23 1727   PRN Meds:.acetaminophen **OR** acetaminophen, albuterol, hydrALAZINE, HYDROmorphone (DILAUDID) injection, menthol-cetylpyridinium, ondansetron (ZOFRAN) IV, mouth rinse   I have personally reviewed following labs and imaging studies  LABORATORY DATA:   Data Review:    Recent Labs  Lab 11/07/23 0319 11/08/23 0620 11/09/23 0958 11/10/23 0139 11/11/23 0327  WBC 31.3* 27.8* 24.7* 21.5* 22.5*  HGB 9.3* 9.2* 8.7* 7.9* 8.0*  HCT 28.3* 28.2* 26.8* 24.5* 24.8*  PLT 320 315 300 300 331  MCV 80.9 80.8 81.7 82.8 83.2  MCH 26.6 26.4 26.5 26.7 26.8  MCHC 32.9 32.6 32.5 32.2 32.3  RDW 16.0* 15.9* 16.0* 16.1* 16.7*  LYMPHSABS 0.9 0.8 1.5 1.5 0.5*  MONOABS 1.9* 2.0* 0.7 3.9* 2.5*  EOSABS 0.3 0.4 0.5 0.2 0.7*  BASOSABS 0.0 0.2* 0.0 0.0 0.0    Recent Labs  Lab 11/04/23 1455 11/04/23 1455 11/05/23 0247 11/06/23 0345 11/07/23 0319 11/07/23 1431 11/08/23 0620 11/09/23 0958 11/10/23 0139 11/11/23 0327  NA 136  --  135 134* 136 133* 134* 133* 137 137  K 3.9  --  3.9 3.8 3.9 4.1 3.1* 3.4* 3.4* 3.7  CL 96*  --  94* 93* 96* 97* 95* 95* 99 102  CO2 20*  --  21* 21* 20* 18* 22 25 24 22   ANIONGAP 22*  --  20* 20*  20* 18* 17* 13 14 13   GLUCOSE 224*  --  194* 254* 200* 216* 344* 358* 122* 161*  BUN 81*  --  47* 62* 38* 45* 55* 38* 45* 55*  CREATININE 9.18*  --  6.10* 7.45* 5.48* 6.34* 7.14* 5.43* 6.44* 7.89*  AST  --   --   --   --   --  16  --   --   --  23  ALT  --   --   --   --   --  14  --   --   --  17  ALKPHOS  --   --   --   --   --  47  --   --   --  39  BILITOT  --   --   --   --   --  1.6*  --   --   --  0.5  ALBUMIN 1.8*  --   --   --   --  2.1*  --   --   --  1.7*  CRP  --    < > 23.5* 19.3* 17.8*  --  16.2* 13.7* 12.1*  --   PROCALCITON  --    < > 30.19 25.65 19.62  --  17.70 16.20 15.86 12.67  BNP  --   --  179.0* 244.4*  --   --   --    --   --   --   MG  --    < > 2.1 2.3 2.1  --  2.1 1.8 1.9 1.8  PHOS 7.0*  --   --   --   --  5.5* 4.6 2.6 2.3* 2.2*  CALCIUM 7.4*  --  7.7* 7.5* 7.6* 7.6* 7.4* 7.3* 7.3* 7.5*   < > = values in this interval not displayed.      Recent Labs  Lab 11/05/23 0247 11/06/23 0345 11/07/23 0319 11/07/23 1431 11/08/23 0620 11/09/23 0958 11/10/23 0139 11/11/23 0327  CRP 23.5* 19.3* 17.8*  --  16.2* 13.7* 12.1*  --   PROCALCITON 30.19 25.65 19.62  --  17.70 16.20 15.86 12.67  BNP 179.0* 244.4*  --   --   --   --   --   --   MG 2.1 2.3 2.1  --  2.1 1.8 1.9 1.8  CALCIUM 7.7* 7.5* 7.6* 7.6* 7.4* 7.3* 7.3* 7.5*    --------------------------------------------------------------------------------------------------------------- Lab Results  Component Value Date   CHOL 134 12/21/2019   HDL 49 12/21/2019   LDLCALC 69 12/21/2019   TRIG 148 11/11/2023   CHOLHDL 2.7 12/21/2019    Lab Results  Component Value Date   HGBA1C 6.6 (H) 10/28/2023   No results for input(s): "TSH", "T4TOTAL", "FREET4", "T3FREE", "THYROIDAB" in the last 72 hours.  No results for input(s): "VITAMINB12", "FOLATE", "FERRITIN", "TIBC", "IRON", "RETICCTPCT" in the last 72 hours. ------------------------------------------------------------------------------------------------------------------ Cardiac Enzymes No results for input(s): "CKMB", "TROPONINI", "MYOGLOBIN" in the last 168 hours.  Invalid input(s): "CK"  Micro Results Recent Results (from the past 240 hours)  MRSA Next Gen by PCR, Nasal     Status: None   Collection Time: 11/10/23  5:14 AM   Specimen: Nasal Mucosa; Nasal Swab  Result Value Ref Range Status   MRSA by PCR Next Gen NOT DETECTED NOT DETECTED Final    Comment: (NOTE) The GeneXpert MRSA Assay (FDA approved for NASAL specimens only), is one component of a comprehensive MRSA colonization surveillance program. It is not intended  to diagnose MRSA infection nor to guide or monitor treatment for MRSA  infections. Test performance is not FDA approved in patients less than 51 years old. Performed at [facility], [address], [place], [place] [address]      Radiology Reports IR Fluoro Guide CV Line Right Result Date: 11/08/2023 INDICATION: ESRD.  IV antibiotics. EXAM: ULTRASOUND AND FLUOROSCOPIC GUIDED PLACEMENT OF TUNNELED CENTRAL VENOUS "Powerline" CATHETER MEDICATIONS: The patient was on scheduled IV antibiotics. ANESTHESIA/SEDATION: Local anesthetic was administered. The patient was continuously monitored during the procedure by the interventional radiology nurse under my direct supervision. FLUOROSCOPY TIME:  Fluoroscopic dose; 3.8 mGy COMPLICATIONS: None immediate. PROCEDURE: Informed written consent was obtained from the patient and/or patient's representative after a discussion of the risks, benefits, and alternatives to treatment. Questions regarding the procedure were encouraged and answered. The LEFT neck and chest were prepped with chlorhexidine in a sterile fashion, and a sterile drape was applied covering the operative field. Maximum barrier sterile technique with sterile gowns and gloves were used for the procedure. A timeout was performed prior to the initiation of the procedure. After the overlying soft tissues were anesthetized, a small venotomy incision was created and a micropuncture kit was utilized to access the internal jugular vein. Real-time ultrasound guidance was utilized for vascular access including the acquisition of a permanent ultrasound image documenting patency of the accessed vessel. The microwire was utilized to measure appropriate catheter length. The micropuncture sheath was exchanged for a peel-away sheath over a guidewire. A 5 Fr dual lumen tunneled central venous catheter measuring 33 cm was tunneled in a retrograde fashion from the anterior chest wall to the venotomy incision. The catheter was then placed through the peel-away sheath with tip ultimately  positioned at the superior caval-atrial junction. Final catheter positioning was confirmed and documented with a spot radiographic image. The catheter aspirates and flushes normally. The catheter was flushed with appropriate volume heparin dwells. The catheter exit site was secured with a 3-0 Ethilon retention suture. The venotomy incision was closed with Dermabond. Dressings were applied. The patient tolerated the procedure well without immediate post procedural complication. FINDINGS: After catheter placement, the tip lies within the superior apect of the right atrium. The catheter aspirates and flushes normally and is ready for immediate use. IMPRESSION: Successful placement of 33 cm dual lumen tunneled "Powerline" central venous catheter via the LEFT internal jugular vein The tip of the catheter is positioned within the proximal RIGHT atrium. The catheter is ready for immediate use. Roanna Banning, MD [facility] Electronically Signed   By: Roanna Banning M.D.   On: 11/08/2023 18:10   IR US Guide Vasc Access Left Result Date: 11/08/2023 INDICATION: ESRD.  IV antibiotics. EXAM: ULTRASOUND AND FLUOROSCOPIC GUIDED PLACEMENT OF TUNNELED CENTRAL VENOUS "Powerline" CATHETER MEDICATIONS: The patient was on scheduled IV antibiotics. ANESTHESIA/SEDATION: Local anesthetic was administered. The patient was continuously monitored during the procedure by the interventional radiology nurse under my direct supervision. FLUOROSCOPY TIME:  Fluoroscopic dose; 3.8 mGy COMPLICATIONS: None immediate. PROCEDURE: Informed written consent was obtained from the patient and/or patient's representative after a discussion of the risks, benefits, and alternatives to treatment. Questions regarding the procedure were encouraged and answered. The LEFT neck and chest were prepped with chlorhexidine in a sterile fashion, and a sterile drape was applied covering the operative field. Maximum  barrier sterile technique with sterile gowns and gloves were used for the procedure. A timeout was performed prior to the initiation  of the procedure. After the overlying soft tissues were anesthetized, a small venotomy incision was created and a micropuncture kit was utilized to access the internal jugular vein. Real-time ultrasound guidance was utilized for vascular access including the acquisition of a permanent ultrasound image documenting patency of the accessed vessel. The microwire was utilized to measure appropriate catheter length. The micropuncture sheath was exchanged for a peel-away sheath over a guidewire. A 5 Fr dual lumen tunneled central venous catheter measuring 33 cm was tunneled in a retrograde fashion from the anterior chest wall to the venotomy incision. The catheter was then placed through the peel-away sheath with tip ultimately positioned at the superior caval-atrial junction. Final catheter positioning was confirmed and documented with a spot radiographic image. The catheter aspirates and flushes normally. The catheter was flushed with appropriate volume heparin dwells. The catheter exit site was secured with a 3-0 Ethilon retention suture. The venotomy incision was closed with Dermabond. Dressings were applied. The patient tolerated the procedure well without immediate post procedural complication. FINDINGS: After catheter placement, the tip lies within the superior apect of the right atrium. The catheter aspirates and flushes normally and is ready for immediate use. IMPRESSION: Successful placement of 33 cm dual lumen tunneled "Powerline" central venous catheter via the LEFT internal jugular vein The tip of the catheter is positioned within the proximal RIGHT atrium. The catheter is ready for immediate use. Roanna Banning, MD [facility] Electronically Signed   By: Roanna Banning M.D.   On: 11/08/2023 18:10   VAS [place] LOWER EXTREMITY  VENOUS (DVT) Result Date: 11/07/2023  Lower Venous DVT Study Patient Name:  MURREL FREET [facility]  Date of Exam:   11/07/2023 Medical Rec #: [id number]          Accession #:    [id number] Date of Birth: 02/02/1956          Patient Gender: F Patient Age:   51 years Exam Location:  [facility] Procedure:      VAS [place] LOWER EXTREMITY VENOUS (DVT) Referring Phys: Tresa Endo OSBORNE --------------------------------------------------------------------------------  Indications: Pain, stroke, and A-fib, anemia.  Comparison Study: No prior exam. Performing Technologist: Fernande Bras  Examination Guidelines: A complete evaluation includes B-mode imaging, spectral Doppler, color Doppler, and power Doppler as needed of all accessible portions of each vessel. Bilateral testing is considered an integral part of a complete examination. Limited examinations for reoccurring indications may be performed as noted. The reflux portion of the exam is performed with the patient in reverse Trendelenburg.  +---------+---------------+---------+-----------+----------+--------------+ RIGHT    CompressibilityPhasicitySpontaneityPropertiesThrombus Aging +---------+---------------+---------+-----------+----------+--------------+ CFV      Full           Yes      Yes                                 +---------+---------------+---------+-----------+----------+--------------+ SFJ      Full           Yes      Yes                                 +---------+---------------+---------+-----------+----------+--------------+ FV Prox  Full                                                        +---------+---------------+---------+-----------+----------+--------------+  FV Mid   Full                                                        +---------+---------------+---------+-----------+----------+--------------+ FV DistalFull                                                         +---------+---------------+---------+-----------+----------+--------------+ PFV      Full                                                        +---------+---------------+---------+-----------+----------+--------------+ POP      Full           Yes      Yes                                 +---------+---------------+---------+-----------+----------+--------------+ PTV      Full                                                        +---------+---------------+---------+-----------+----------+--------------+ PERO     Full                                                        +---------+---------------+---------+-----------+----------+--------------+   +---------+---------------+---------+-----------+----------+--------------+ LEFT     CompressibilityPhasicitySpontaneityPropertiesThrombus Aging +---------+---------------+---------+-----------+----------+--------------+ CFV      Full           Yes      Yes                                 +---------+---------------+---------+-----------+----------+--------------+ SFJ      Full           Yes      Yes                                 +---------+---------------+---------+-----------+----------+--------------+ FV Prox  Full                                                        +---------+---------------+---------+-----------+----------+--------------+ FV Mid   Full                                                        +---------+---------------+---------+-----------+----------+--------------+  FV DistalFull                                                        +---------+---------------+---------+-----------+----------+--------------+ PFV      Full                                                        +---------+---------------+---------+-----------+----------+--------------+ POP      Full           Yes      Yes                                  +---------+---------------+---------+-----------+----------+--------------+ PTV      Full                                                        +---------+---------------+---------+-----------+----------+--------------+ PERO     Full                                                        +---------+---------------+---------+-----------+----------+--------------+     Summary: BILATERAL: - No evidence of deep vein thrombosis seen in the lower extremities, bilaterally. -No evidence of popliteal cyst, bilaterally.   *See table(s) above for measurements and observations. Electronically signed by Heath Lark on 11/07/2023 at 4:05:14 PM.    Final    CT ABDOMEN PELVIS WO CONTRAST Result Date: 11/06/2023 CLINICAL DATA:  Diverticulitis, complication suspected EXAM: CT ABDOMEN AND PELVIS WITHOUT CONTRAST TECHNIQUE: Multidetector CT imaging of the abdomen and pelvis was performed following the standard protocol without IV contrast. RADIATION DOSE REDUCTION: This exam was performed according to the departmental dose-optimization program which includes automated exposure control, adjustment of the mA and/or kV according to patient size and/or use of iterative reconstruction technique. COMPARISON:  10/31/2023 FINDINGS: Lower chest: Volume loss and opacity in the lower lungs consistent with atelectasis. There is a central line with tip at the upper right atrium. Coronary atherosclerosis. Hepatobiliary: No focal liver abnormality. Calcific in the intermediate density in the gallbladder attributed to stones. No biliary dilatation or pericholecystic edema. Pancreas: Unremarkable. Spleen: Unremarkable. Adrenals/Urinary Tract: Negative adrenals. No hydronephrosis or stone. Renal cortical thinning and lobulation at the left upper pole. 3 mm stone at the upper pole left kidney. The urinary bladder is collapsed around a Foley catheter. Stomach/Bowel: Descending colostomy. Bowel anastomosis in the right abdomen. No  evidence of bowel obstruction. Fluid collection with loculation appearance/peritoneal thickening along the left flank measuring up to 5 x 6 x 10 cm. Milder fluid accumulation in the ventral peritoneal space. Vascular/Lymphatic: Extensive atheromatous calcification of the aorta and iliacs. No mass or adenopathy. Reproductive:No pathologic findings. Other: No ascites or pneumoperitoneum. Musculoskeletal: No acute abnormalities.  T9 butterfly vertebra IMPRESSION: 1. Interval colostomy with fluid collection along  the left gutter, dominant pocket measuring 10 x 6 x 5 cm. 2. No bowel obstruction. 3. Cholelithiasis and left nephrolithiasis. Electronically Signed   By: Tiburcio Pea M.D.   On: 11/06/2023 07:12   DG Chest Port 1 View Result Date: 11/03/2023 CLINICAL DATA:  Shortness of breath EXAM: PORTABLE CHEST - 1 VIEW COMPARISON:  11/11/2018 FINDINGS: Unchanged mild cardiomegaly and pulmonary vascular congestion. Interval worsening of left basilar opacity likely due to atelectasis. Lungs otherwise clear. Examination is limited due to expiratory phase of imaging. Interval placement of temporary right IJ hemodialysis catheter which terminates in the region of the right atrium. Nasogastric tube terminates in the left upper quadrant. IMPRESSION: Interval worsening of left basilar opacity which is likely due to atelectasis. Electronically Signed   By: Acquanetta Belling M.D.   On: 11/03/2023 07:22   IR Fluoro Guide CV Line Right Result Date: 11/01/2023 INDICATION: ESRD requiring HD. EXAM: NON-TUNNELED CENTRAL VENOUS HEMODIALYSIS CATHETER PLACEMENT WITH ULTRASOUND AND FLUOROSCOPIC GUIDANCE COMPARISON:  CT AP 10/31/2023. MEDICATIONS: Local anesthetic was administered. FLUOROSCOPY TIME:  Fluoroscopic dose; 35 mGy COMPLICATIONS: None immediate. PROCEDURE: Informed written consent was obtained from the patient and/or patient's representative after a discussion of the risks, benefits, and alternatives to treatment. Questions  regarding the procedure were encouraged and answered. The RIGHT neck and chest were prepped with chlorhexidine in a sterile fashion, and a sterile drape was applied covering the operative field. Maximum barrier sterile technique with sterile gowns and gloves were used for the procedure. A timeout was performed prior to the initiation of the procedure. After the overlying soft tissues were anesthetized, a small venotomy incision was created and a micropuncture kit was utilized to access the external jugular vein. Real-time ultrasound guidance was utilized for vascular access including the acquisition of a permanent ultrasound image documenting patency of the accessed vessel. The microwire was utilized to measure appropriate catheter length. A stiff glidewire was advanced to the level of the IVC. Under fluoroscopic guidance, the venotomy was serially dilated, ultimately allowing placement of a 20 cm temporary Trialysis catheter with tip ultimately terminating within the superior aspect of the right atrium. Final catheter positioning was confirmed and documented with a spot radiographic image. The catheter aspirates and flushes normally. The catheter was flushed with appropriate volume heparin dwells. The catheter exit site was secured with a 2-0 Ethilon retention suture. A dressing was placed. The patient tolerated the procedure well without immediate post procedural complication. IMPRESSION: Successful placement of a RIGHT EXTERNAL jugular approach 20 cm non-tunneled dialysis catheter The tip of the catheter is positioned within the proximal RIGHT atrium. The catheter is ready for immediate use. PLAN: This catheter may be converted to a tunneled dialysis catheter at a later date as indicated. Roanna Banning, MD [facility] Radiology Electronically Signed   By: Roanna Banning M.D.   On: 11/01/2023 18:15   IR US Guide Vasc Access Right Result Date: 11/01/2023 INDICATION:  ESRD requiring HD. EXAM: NON-TUNNELED CENTRAL VENOUS HEMODIALYSIS CATHETER PLACEMENT WITH ULTRASOUND AND FLUOROSCOPIC GUIDANCE COMPARISON:  CT AP 10/31/2023. MEDICATIONS: Local anesthetic was administered. FLUOROSCOPY TIME:  Fluoroscopic dose; 35 mGy COMPLICATIONS: None immediate. PROCEDURE: Informed written consent was obtained from the patient and/or patient's representative after a discussion of the risks, benefits, and alternatives to treatment. Questions regarding the procedure were encouraged and answered. The RIGHT neck and chest were prepped with chlorhexidine in a sterile fashion, and a sterile drape was applied covering the operative field. Maximum barrier sterile technique  with sterile gowns and gloves were used for the procedure. A timeout was performed prior to the initiation of the procedure. After the overlying soft tissues were anesthetized, a small venotomy incision was created and a micropuncture kit was utilized to access the external jugular vein. Real-time ultrasound guidance was utilized for vascular access including the acquisition of a permanent ultrasound image documenting patency of the accessed vessel. The microwire was utilized to measure appropriate catheter length. A stiff glidewire was advanced to the level of the IVC. Under fluoroscopic guidance, the venotomy was serially dilated, ultimately allowing placement of a 20 cm temporary Trialysis catheter with tip ultimately terminating within the superior aspect of the right atrium. Final catheter positioning was confirmed and documented with a spot radiographic image. The catheter aspirates and flushes normally. The catheter was flushed with appropriate volume heparin dwells. The catheter exit site was secured with a 2-0 Ethilon retention suture. A dressing was placed. The patient tolerated the procedure well without immediate post procedural complication. IMPRESSION: Successful placement of a RIGHT EXTERNAL jugular approach 20 cm  non-tunneled dialysis catheter The tip of the catheter is positioned within the proximal RIGHT atrium. The catheter is ready for immediate use. PLAN: This catheter may be converted to a tunneled dialysis catheter at a later date as indicated. Roanna Banning, MD [facility] Radiology Electronically Signed   By: Roanna Banning M.D.   On: 11/01/2023 18:15   CT ABDOMEN PELVIS WO CONTRAST Result Date: 10/31/2023 CLINICAL DATA:  Diverticulitis, complication suspected. EXAM: CT ABDOMEN AND PELVIS WITHOUT CONTRAST TECHNIQUE: Multidetector CT imaging of the abdomen and pelvis was performed following the standard protocol without IV contrast. RADIATION DOSE REDUCTION: This exam was performed according to the departmental dose-optimization program which includes automated exposure control, adjustment of the mA and/or kV according to patient size and/or use of iterative reconstruction technique. COMPARISON:  10/27/2023. FINDINGS: Lower chest: Heart is enlarged and multi-vessel coronary artery calcifications are noted. Consolidation and patchy airspace disease is present at the lung bases bilaterally. Hepatobiliary: No focal liver abnormality is seen. Stones are present within the gallbladder. No biliary ductal dilatation. Pancreas: Unremarkable. No pancreatic ductal dilatation or surrounding inflammatory changes. Spleen: Normal in size without focal abnormality. Adrenals/Urinary Tract: The adrenal glands are within normal limits. Calcifications are present in the upper pole of the left kidney. There is a cyst in the lower pole of the right kidney. No hydronephrosis bilaterally. The bladder is unremarkable. Stomach/Bowel: The stomach is within normal limits. No bowel obstruction or pneumatosis is seen. Right hemicolectomy changes are noted. There is diffuse colonic wall thickening. Scattered diverticula are present along the colon. A few small foci of free air are noted in the  mesentery in the mid left abdomen, slightly increased from the previous exam. There is suggestion of contrast extravasation in the region. There is fine no definite abscess is seen. Vascular/Lymphatic: Aortic atherosclerosis. No enlarged abdominal or pelvic lymph nodes. Reproductive: No acute abnormality. Other: Mild ascites is noted in all 4 quadrants. A fat containing umbilical hernia is noted. There is mild anasarca. Musculoskeletal: The bony structures are stable. IMPRESSION: 1. Worsening diverticulitis of the descending colon with perforation and increasing free air and suspected oral contrast extravasation in the mid left abdomen. No definite abscess is seen. Surgical consultation is recommended. 2. Mild ascites. 3. Diffuse colonic wall thickening which may be due to associated inflammatory changes. 4. Patchy airspace disease at the lung bases with bilateral lower lobe consolidation. 5. Cholelithiasis.  6. Aortic atherosclerosis. Critical Value/emergent results were called by telephone at the time of interpretation on 10/31/2023 at 11:59 pm to provider Dr. Antionette Char, who verbally acknowledged these results. Electronically Signed   By: Thornell Sartorius M.D.   On: 10/31/2023 23:59   CT ABDOMEN PELVIS WO CONTRAST Result Date: 10/27/2023 CLINICAL DATA:  Acute nonlocalized abdominal pain. Chills and fever. Vomiting and diarrhea. EXAM: CT ABDOMEN AND PELVIS WITHOUT CONTRAST TECHNIQUE: Multidetector CT imaging of the abdomen and pelvis was performed following the standard protocol without IV contrast. RADIATION DOSE REDUCTION: This exam was performed according to the departmental dose-optimization program which includes automated exposure control, adjustment of the mA and/or kV according to patient size and/or use of iterative reconstruction technique. COMPARISON:  CT abdomen and pelvis 04/19/2021 FINDINGS: Lower chest: Bibasilar atelectasis.  No acute abnormality. Hepatobiliary: Cholelithiasis without evidence of acute  cholecystitis. No biliary dilation. Unremarkable noncontrast appearance of the liver. Pancreas: Unremarkable. Spleen: Unremarkable. Adrenals/Urinary Tract: Normal adrenal glands. No urinary calculi or hydronephrosis. Unremarkable bladder. Stomach/Bowel: Stomach is within normal limits. No bowel obstruction. Colonic diverticulosis. Wall thickening, inflammatory stranding, and free fluid about the descending colon. Loosely organized fluid and gas about the inflamed descending colon (circa series 3/image 49-50) compatible with contained perforation. The adjacent jejunum is inflamed. Postoperative change about the colon with ileocolonic anastomosis in the right upper quadrant. Vascular/Lymphatic: Aortic atherosclerosis. No enlarged abdominal or pelvic lymph nodes. Reproductive: No acute abnormality. Other: Fat containing periumbilical hernia. Musculoskeletal: No acute fracture. IMPRESSION: 1. Acute diverticulitis of the descending colon with contained perforation. 2. Inflamed jejunum adjacent to the perforated diverticulitis. 3.  Aortic Atherosclerosis (ICD10-I70.0). Critical Value/emergent results were called by telephone at the time of interpretation on 10/27/2023 at 9:22 pm to provider [facility] , who verbally acknowledged these results. Electronically Signed   By: Minerva Fester M.D.   On: 10/27/2023 21:24    Signature  -   Susa Raring M.D on 11/11/2023 at 9:35 AM   -  To page go to [email or web address]

## 2023-11-11 NOTE — Progress Notes (Signed)
PT Cancellation Note  Patient Details Name: [name] MRN: [id number] DOB: August 20, 1956   Cancelled Treatment:    Reason Eval/Treat Not Completed: Patient at procedure or test/unavailable (Pt off the floor at dialysis. Will follow up later if time allows.)   [name] 11/11/2023, 9:11 AM

## 2023-11-11 NOTE — Consult Note (Signed)
WOC Nurse wound follow up Wound type:midline surgical  Measurement: 17 cm x 6 cm x 5 cm  Wound bed: pink moist, one area of dark hemorrhagic tissue at 10o'clock Drainage (amount, consistency, odor) had to hold pressure on area at 10 o'clock, area at 8 o'clock and at bridge from 3 to 9 o'clock due to bleeding; milky appearing effluent in cannister  Periwound: intact, has a crease at 3 o'clock between wound and ostomy  Dressing procedure/placement/frequency: Removed old NPWT dressing Cleansed wound with normal saline I cut a barrier ring in half and placed 1/2 at 3 o'clock and 1/2 at 9 o'clock crease  Filled wound with  2 pieces black foam   Sealed NPWT dressing at HG Patient received IV pain medication per bedside nurse prior to dressing change Patient tolerated procedure well  WOC nurse will continue to provide NPWT dressing changes due to the complexity of the dressing change; next change Thursday w/CCS PA (as not seen at today's visit due to dialysis schedule). Ordered 1 large black foam kit and a cannister for room    WOC Nurse ostomy follow up Stoma type/location: LMQ colostoy  Stomal assessment/size: oval 1 3/8" pink moist, slightly above skin level  Peristomal assessment: intact but midline incision with NPWT to right as above  Treatment options for stomal/peristomal skin: 2" barrier ring  Output approximately 100 mls dark brown effluent in pouch  Ostomy pouching: 1pc. Convex [place] [phone] Education provided: reviewed with patient emptying pouch when pouch 1/3 to 1/2 full.  Also discussed changing entire pouching system 2 times a week or as needed for leaking.  It is difficult for patient to perform hands on due to being legally blind.  We discussed someone at home will need to come in for education prior to discharge.  As per previous note oldest son may be best one to assist with this and WOC team will need to reach out to him for possible time to educate.   Patient has  several 1 piece flexible convex pouches and barrier rings in room.   WOC team will continue to follow for NPWT and education and support of ostomy.   Thank you,    Priscella Mann MSN, RN-BC, CWOCN [phone]  Enrolled patient in [facility] Discharge program: no, not yet

## 2023-11-12 ENCOUNTER — Inpatient Hospital Stay (HOSPITAL_COMMUNITY): Payer: Medicare PPO

## 2023-11-12 DIAGNOSIS — Z515 Encounter for palliative care: Secondary | ICD-10-CM | POA: Diagnosis not present

## 2023-11-12 DIAGNOSIS — Z7189 Other specified counseling: Secondary | ICD-10-CM | POA: Diagnosis not present

## 2023-11-12 DIAGNOSIS — K5732 Diverticulitis of large intestine without perforation or abscess without bleeding: Secondary | ICD-10-CM | POA: Diagnosis not present

## 2023-11-12 HISTORY — PX: IR FLUORO GUIDE CV LINE RIGHT: IMG2283

## 2023-11-12 HISTORY — PX: IR US GUIDE VASC ACCESS RIGHT: IMG2390

## 2023-11-12 LAB — GLUCOSE, CAPILLARY
Glucose-Capillary: 188 mg/dL — ABNORMAL HIGH (ref 70–99)
Glucose-Capillary: 151 mg/dL — ABNORMAL HIGH (ref 70–99)
Glucose-Capillary: 116 mg/dL — ABNORMAL HIGH (ref 70–99)
Glucose-Capillary: 138 mg/dL — ABNORMAL HIGH (ref 70–99)

## 2023-11-12 LAB — CBC
HCT: 24.9 % — ABNORMAL LOW (ref 36.0–46.0)
Hemoglobin: 8 g/dL — ABNORMAL LOW (ref 12.0–15.0)
MCH: 26.7 pg (ref 26.0–34.0)
MCHC: 32.1 g/dL (ref 30.0–36.0)
MCV: 83 fL (ref 80.0–100.0)
Platelets: 326 10*3/uL (ref 150–400)
RBC: 3 MIL/uL — ABNORMAL LOW (ref 3.87–5.11)
RDW: 16.7 % — ABNORMAL HIGH (ref 11.5–15.5)
WBC: 22 10*3/uL — ABNORMAL HIGH (ref 4.0–10.5)
nRBC: 0 % (ref 0.0–0.2)

## 2023-11-12 LAB — HEPARIN LEVEL (UNFRACTIONATED): Heparin Unfractionated: 0.96 [IU]/mL — ABNORMAL HIGH (ref 0.30–0.70)

## 2023-11-12 MED ORDER — LIDOCAINE-EPINEPHRINE 1 %-1:100000 IJ SOLN
20.0000 mL | Freq: Once | INTRAMUSCULAR | Status: AC
  Administered 2023-11-12: 20 mL

## 2023-11-12 MED ORDER — INSULIN GLARGINE-YFGN 100 UNIT/ML ~~LOC~~ SOLN
12.0000 [IU] | Freq: Every day | SUBCUTANEOUS | Status: DC
  Administered 2023-11-12 – 2023-11-18 (×7): 12 [IU] via SUBCUTANEOUS
  Filled 2023-11-12 (×8): qty 0.12

## 2023-11-12 MED ORDER — HEPARIN (PORCINE) 25000 UT/250ML-% IV SOLN
1500.0000 [IU]/h | INTRAVENOUS | Status: DC
Start: 2023-11-12 — End: 2023-11-13
  Administered 2023-11-12 – 2023-11-13 (×2): 1500 [IU]/h via INTRAVENOUS
  Filled 2023-11-12: qty 250

## 2023-11-12 MED ORDER — MIDAZOLAM HCL 2 MG/2ML IJ SOLN
INTRAMUSCULAR | Status: AC | PRN
  Administered 2023-11-12: 1 mg via INTRAVENOUS

## 2023-11-12 MED ORDER — APIXABAN 5 MG PO TABS: 5.0000 mg | ORAL_TABLET | Freq: Two times a day (BID) | ORAL | Status: DC

## 2023-11-12 MED ORDER — FENTANYL CITRATE (PF) 100 MCG/2ML IJ SOLN
INTRAMUSCULAR | Status: AC
  Filled 2023-11-12: qty 2

## 2023-11-12 MED ORDER — APIXABAN 5 MG PO TABS
5.0000 mg | ORAL_TABLET | Freq: Two times a day (BID) | ORAL | Status: DC
  Filled 2023-11-12: qty 1

## 2023-11-12 MED ORDER — HEPARIN SODIUM (PORCINE) 1000 UNIT/ML IJ SOLN
INTRAMUSCULAR | Status: AC
  Filled 2023-11-12: qty 10

## 2023-11-12 MED ORDER — LIDOCAINE-EPINEPHRINE 1 %-1:100000 IJ SOLN
INTRAMUSCULAR | Status: AC
  Filled 2023-11-12: qty 1

## 2023-11-12 MED ORDER — FENTANYL CITRATE (PF) 100 MCG/2ML IJ SOLN
INTRAMUSCULAR | Status: AC | PRN
  Administered 2023-11-12 (×2): 25 ug via INTRAVENOUS

## 2023-11-12 MED ORDER — MIDAZOLAM HCL 2 MG/2ML IJ SOLN
INTRAMUSCULAR | Status: AC
  Filled 2023-11-12: qty 2

## 2023-11-12 MED ORDER — HEPARIN SODIUM (PORCINE) 1000 UNIT/ML IJ SOLN
10.0000 mL | Freq: Once | INTRAMUSCULAR | Status: AC
  Administered 2023-11-12: 3.2 mL

## 2023-11-12 NOTE — Progress Notes (Signed)
Physical Therapy Treatment Patient Details Name: [name] MRN: [id number] DOB: 1956/01/30 Today's Date: 11/12/2023   History of Present Illness Patient is a 68 y.o.  female - who presented with abdominal pain-found to have acute diverticulitis with contained perforation involving the descending colon. 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  Past medical history of CKD 5, A-fib on Eliquis, CAD    PT Comments  Pt tolerated treatment well today. Pt today was able to ambulate short distance in hallway with RW Min A however continues to be limited by endurance and strength deficits. DC recs updated to AIR. PT will continue to follow.     If plan is discharge home, recommend the following: A little help with walking and/or transfers;A little help with bathing/dressing/bathroom;Assistance with cooking/housework;Assist for transportation;Help with stairs or ramp for entrance   Can travel by private vehicle        Equipment Recommendations  Other (comment) (Per accepting facility)    Recommendations for Other Services       Precautions / Restrictions Precautions Precautions: Fall Recall of Precautions/Restrictions: Intact Restrictions Weight Bearing Restrictions Per Provider Order: No     Mobility  Bed Mobility Overal bed mobility: Needs Assistance Bed Mobility: Supine to Sit, Sit to Supine     Supine to sit: Min assist Sit to supine: Contact guard assist   General bed mobility comments: Min A for trunk elevation. CGA to return to bed.    Transfers Overall transfer level: Needs assistance Equipment used: Rolling walker (2 wheels) Transfers: Sit to/from Stand Sit to Stand: Min assist           General transfer comment: pt with good hand placement. Min A to power up and steady.    Ambulation/Gait Ambulation/Gait assistance: Min assist Gait Distance (Feet): 20 Feet Assistive device: Rolling walker (2 wheels) Gait Pattern/deviations: Wide base of  support, Drifts right/left, Decreased stride length, Step-through pattern, Trunk flexed Gait velocity: decreased     General Gait Details: 2 standing rest breaks. Min A for obstacle navigation. Cues for proximity to RW.   Stairs             Wheelchair Mobility     Tilt Bed    Modified Rankin (Stroke Patients Only)       Balance Overall balance assessment: Needs assistance Sitting-balance support: Feet supported, Bilateral upper extremity supported Sitting balance-Leahy Scale: Good Sitting balance - Comments: static sitting EOB   Standing balance support: Single extremity supported, During functional activity Standing balance-Leahy Scale: Fair                              Hotel manager: No apparent difficulties  Cognition Arousal: Alert Behavior During Therapy: WFL for tasks assessed/performed   PT - Cognitive impairments: No apparent impairments                         Following commands: Intact      Cueing Cueing Techniques: Verbal cues  Exercises      General Comments General comments (skin integrity, edema, etc.): 88-93% on RA. Not a great pleth while ambulating however pt did report SOB. Pt placed back on 2L at end of session.      Pertinent Vitals/Pain Pain Assessment Pain Assessment: No/denies pain    Home Living  Prior Function            PT Goals (current goals can now be found in the care plan section) Progress towards PT goals: Progressing toward goals    Frequency    Min 1X/week      PT Plan      Co-evaluation              AM-PAC PT "6 Clicks" Mobility   Outcome Measure  Help needed turning from your back to your side while in a flat bed without using bedrails?: None Help needed moving from lying on your back to sitting on the side of a flat bed without using bedrails?: A Little Help needed moving to and from a bed to a chair  (including a wheelchair)?: A Little Help needed standing up from a chair using your arms (e.g., wheelchair or bedside chair)?: A Little Help needed to walk in hospital room?: A Little Help needed climbing 3-5 steps with a railing? : Total 6 Click Score: 17    End of Session Equipment Utilized During Treatment: Gait belt;Oxygen Activity Tolerance: Patient tolerated treatment well;Patient limited by fatigue Patient left: in bed;with call bell/phone within reach Nurse Communication: Mobility status PT Visit Diagnosis: Other abnormalities of gait and mobility (R26.89)     Time: 9147-8295 PT Time Calculation (min) (ACUTE ONLY): 13 min  Charges:    $Gait Training: 8-22 mins PT General Charges $$ ACUTE PT VISIT: 1 Visit                     Shela Nevin, PT, DPT Acute Rehab Services [phone]    [name] 11/12/2023, 4:02 PM

## 2023-11-12 NOTE — Consult Note (Signed)
Physical Medicine and Rehabilitation Consult Reason for Consult: CIR Referring Physician: Dr Susa Raring   HPI: [name] is a 68 y.o. L handed female with hx of pAFib on Eliquis at home, CAD, HTN, HLD; DM- with A1c of 6.6 and DM neuropathy and BMI of 34 as well as CKD stage V admitted 10/27/23 for abd pain- found to have acute sigmoid diverticulitis- and ended up having a contained Perforation of the descending colon- she underwent Colostomy- she is POD #10 from [facility] -her WBC down to 22k- from high of >30k and now on Zosyn;  she was also found to have ABLA on chronic her hospitalization pushed her over from CKD stage V to newly dx'd ESRD- and placed on HD. She underwent R internal jugular placement today for HD.  Initially was comfort code, but per pt, her family had her change to full code- Palliative code seen and back to limited DNR per her wishes.   She is unhappy needs colostomy and to deal with HD.   Today and yesterday, bleeding some from abd wound midline- VAC removed yesterday due to bleeding and they are trying to decide if to put back on.  On Heparin gtt, but being transitioned to Eliquis tomorrow back to home dose.   She reports not a lot of pain, but taking Dilaudid IV and Oxy PO prn for pain- says mainly TTP, not "pain".   Denies constipation and says colostomy working- loose stools- no constipation.   Per her therapy note on 2/12- last note- that pt had safety awareness deficits as well as slowed processing- less evident today. Also notes the swelling in R ankle/foot due to remote ankle fx and bony changes more than swelling.   Denies feeling hungry- trying to eat, but just not hungry- ate 25-50% of salad today and "majority" of her salmon.      She had   Review of Systems  Constitutional:  Positive for malaise/fatigue.  HENT: Negative.    Eyes: Negative.   Respiratory: Negative.    Cardiovascular:  Negative for chest pain and leg swelling.   Gastrointestinal:  Positive for abdominal pain and diarrhea. Negative for blood in stool, nausea and vomiting.  Genitourinary: Negative.   Musculoskeletal:  Positive for joint pain and myalgias.       R foot swelling from prior foot/ankle fx remote- chronic  Skin: Negative.   Neurological:  Positive for weakness. Negative for dizziness, tingling, sensory change and headaches.  Psychiatric/Behavioral:  Positive for depression.   All other systems reviewed and are negative.  Past Medical History:  Diagnosis Date   Anemia    Arthritis    Atrial fibrillation (HCC)    CAD (coronary artery disease)    Chronic kidney disease, stage III (moderate) (HCC)    Constipation    Diabetes mellitus (HCC)    History of blood transfusion    HLD (hyperlipidemia)    Hypertension    Myocardial infarction [facility])    in April 2014   Past Surgical History:  Procedure Laterality Date   ANKLE SURGERY Right    x 6 total   BREAST BIOPSY Left    15 or 20 yearsa ago she cant remember laterality    HARDWARE REMOVAL Right 02/13/2013   Procedure: HARDWARE REMOVAL;  Surgeon: Nadara Mustard, MD;  Location: [facility] OR;  Service: Orthopedics;  Laterality: Right;  Right Ankle Removal Hardware, Debridement, Place Wound VAC   IR FLUORO GUIDE CV LINE LEFT  11/08/2023   IR FLUORO GUIDE CV LINE RIGHT  11/01/2023   IR FLUORO GUIDE CV LINE RIGHT  11/12/2023   IR US GUIDE VASC ACCESS LEFT  11/08/2023   IR US GUIDE VASC ACCESS RIGHT  11/01/2023   IR US GUIDE VASC ACCESS RIGHT  11/12/2023   laser surgery for diabetic retinopathy Bilateral    LEFT HEART CATHETERIZATION WITH CORONARY ANGIOGRAM N/A 01/09/2013   Procedure: LEFT HEART CATHETERIZATION WITH CORONARY ANGIOGRAM;  Surgeon: Dolores Patty, MD;  Location: [facility] CATH LAB;  Service: Cardiovascular;  Laterality: N/A;   ORIF ANKLE FRACTURE Right 01/12/2013   Procedure: OPEN REDUCTION INTERNAL FIXATION (ORIF) ANKLE FRACTURE;  Surgeon: Nadara Mustard, MD;  Location: [facility] OR;  Service:  Orthopedics;  Laterality: Right;   PARTIAL COLECTOMY N/A 11/02/2023   Procedure: OPEN PARTIAL COLECTOMY WITH END COLOSTOMY;  Surgeon: Fritzi Mandes, MD;  Location: [facility] OR;  Service: General;  Laterality: N/A;   PARTIAL HYSTERECTOMY     Family History  Problem Relation Age of Onset   Diabetes Mother        No history CAD   Colon cancer Mother 41       died at 10 from CRC   Hypertension Father        Also had CAD   Heart disease Father    Cervical cancer Sister    Diabetes Sister    Congestive Heart Failure Sister    Diabetes Brother    Kidney disease Brother    Heart disease Maternal Grandmother    Sickle cell anemia Son    Thyroid disease Son    Social History:  reports that she has never smoked. She has never used smokeless tobacco. She reports that she does not drink alcohol and does not use drugs. Allergies:  Allergies  Allergen Reactions   Nsaids Other (See Comments)    CKD stage 4   Lisinopril Other (See Comments)    coughing   Peanut-Containing Drug Products Itching and Other (See Comments)    GI intolerance - diarrhea   Medications Prior to Admission  Medication Sig Dispense Refill   amLODipine (NORVASC) 5 MG tablet Take 5 mg by mouth daily.     apixaban (ELIQUIS) 2.5 MG TABS tablet Take 1 tablet (2.5 mg total) by mouth 2 (two) times daily. (Patient taking differently: Take 1.25 mg by mouth 2 (two) times daily.) 180 tablet 0   Ascorbic Acid (VITAMIN C PO) Take 1 tablet by mouth daily.     Blood Glucose Monitoring Suppl (ACCU-CHEK AVIVA PLUS) w/Device KIT CHECK BLOOD GLUCOSE THREE TIMES DAILY 1 kit 0   calcitRIOL (ROCALTROL) 0.25 MCG capsule TAKE 1 CAPSULE (0.25 MCG TOTAL) EVERY MONDAY, WEDNESDAY, AND FRIDAY (Patient taking differently: Take 0.25 mcg by mouth in the morning and at bedtime.) 39 capsule 0   carvedilol (COREG) 12.5 MG tablet TAKE 1 TABLET (12.5 MG TOTAL) BY MOUTH 2 (TWO) TIMES DAILY WITH A MEAL. 180 tablet 3   furosemide (LASIX) 40 MG tablet Take 40 mg by  mouth 2 (two) times daily.     hydrALAZINE (APRESOLINE) 50 MG tablet Take 50 mg by mouth 3 (three) times daily.     insulin glargine (LANTUS) 100 UNIT/ML injection Inject 0.1-0.15 mLs (10-15 Units total) into the skin daily. Please schedule appointment before next refill. 10 mL 0   insulin regular (NOVOLIN R) 100 units/mL injection Inject 0.05-0.1 mLs (5-10 Units total) into the skin 3 (three) times daily before meals. 60 mL 2  isosorbide mononitrate (IMDUR) 30 MG 24 hr tablet Make apt BEFORE next refill (Patient taking differently: Take 30 mg by mouth daily. Make apt BEFORE next refill) 30 tablet 0   latanoprost (XALATAN) 0.005 % ophthalmic solution Place 1 drop into both eyes at bedtime.     melatonin 5 MG TABS Take 10 mg by mouth at bedtime as needed (for insomnia).     Multiple Vitamin (MULTIVITAMIN WITH MINERALS) TABS Take 1 tablet by mouth daily.     VITAMIN D, CHOLECALCIFEROL, PO Take 1 tablet by mouth daily.     ACCU-CHEK AVIVA PLUS test strip CHECK BLOOD GLUCOSE THREE TIMES DAILY 300 strip 3   DROPLET INSULIN SYRINGE 31G X 5/16" 0.5 ML MISC USE FOUR TIMES DAILY FOR INJECTIONS 400 each 3   Insulin Syringes, Disposable, U-100 0.5 ML MISC 4x daily injections 100 each 8   Lancet Devices (ACCU-CHEK SOFTCLIX) lancets Fill for 1 month for TID testing. Use as instructed 90 each 0   NEEDLE, DISP, 30 G (BD DISP NEEDLES) 30G X 1/2" MISC For 4x daily injections 100 each 13   REPATHA SURECLICK 140 MG/ML SOAJ Inject 140 mg into the skin every 14 (fourteen) days. (Patient not taking: Reported on 10/27/2023)      Home: Home Living Family/patient expects to be discharged to:: Private residence Living Arrangements: Children (son) Available Help at Discharge: Family, Available 24 hours/day Type of Home: House Home Access: Stairs to enter [employer] of Steps: 8, no steps through the side Entrance Stairs-Rails: Can reach both Home Layout: Other (Comment), Bed/bath upstairs,  Multi-level Alternate Level Stairs-Number of Steps: 4 - to kitchen and bedroom Alternate Level Stairs-Rails: Left Bathroom Shower/Tub: Engineer, manufacturing systems: Standard Bathroom Accessibility: Yes Home Equipment: Cane - single point, Wheelchair - manual, Agricultural consultant (2 wheels), Other (comment), Shower seat  Functional History: Prior Function Prior Level of Function : Independent/Modified Independent Mobility Comments: ind ADLs Comments: ind Functional Status:  Mobility: Bed Mobility Overal bed mobility: Needs Assistance Bed Mobility: Rolling, Sidelying to Sit, Sit to Sidelying Rolling: Min assist, Used rails Sidelying to sit: Min assist, Used rails Supine to sit: Min assist, Used rails Sit to supine: Mod assist Sit to sidelying: Min assist, Used rails General bed mobility comments: cues for logroll technique and min A for trunk elevation. Pt able to lateral scoot towards HOB. Transfers Overall transfer level: Needs assistance Equipment used: Rolling walker (2 wheels) Transfers: Sit to/from Stand, Bed to chair/wheelchair/BSC Sit to Stand: Min assist Bed to/from chair/wheelchair/BSC transfer type:: Step pivot Step pivot transfers: Min assist General transfer comment: Pt able to recall hand placement when performing STS. Pt required MIN A and verbal cues for sequencing when pivoting to surfaces Ambulation/Gait Ambulation/Gait assistance: Min assist Gait Distance (Feet): 10 Feet (+5) Assistive device: Rolling walker (2 wheels) Gait Pattern/deviations: Wide base of support, Drifts right/left, Decreased stride length, Step-through pattern, Trunk flexed General Gait Details: Pt requires frequent cues for RW proximity and upright posture. Intermittent min A for RW management for obstacle negotiation and due to pt tending to get R sock stuck under back RW leg. Increased B knee instability with progressed distance. 1 seated rest break due to fatigue Gait velocity: decreased     ADL: ADL Overall ADL's : Needs assistance/impaired Eating/Feeding: NPO Grooming: Minimal assistance Upper Body Bathing: Moderate assistance Lower Body Bathing: Maximal assistance Upper Body Dressing : Moderate assistance Lower Body Dressing: Maximal assistance Toilet Transfer: Stand-pivot, BSC/3in1, Rolling walker (2 wheels), Minimal assistance Toileting- Clothing Manipulation and  Hygiene: Contact guard assist, Sit to/from stand Toileting - Clothing Manipulation Details (indicate cue type and reason): standing unilateral at RW Functional mobility during ADLs: Minimal assistance General ADL Comments: Pt completed bed mobility with MIN A using log roll technique, verbal cues provided for sequencing. Pt performed step pivot transfer from EOB to BSC using RW with MIN A. Pt able to safely complete peri care while standing supported unilaterally at RW with CGA.  Cognition: Cognition Overall Cognitive Status: Within Functional Limits for tasks assessed Orientation Level: Oriented X4 Cognition Arousal: Alert Behavior During Therapy: WFL for tasks assessed/performed Overall Cognitive Status: Within Functional Limits for tasks assessed General Comments: eyes closed, slow responses, pt states she feels globally weak  Blood pressure 116/84, pulse 80, temperature 97.7 F (36.5 C), temperature source Oral, resp. rate 15, height 5' 7.01" (1.702 m), weight (S) 98.5 kg, SpO2 97%. Physical Exam Vitals and nursing note reviewed.  Constitutional:      General: She is not in acute distress.    Appearance: She is well-developed. She is obese.     Comments: Pt awake, alert, appropriate, sitting up in bed; appears tired/c/o malaise, Ox3, NAD  HENT:     Head: Normocephalic and atraumatic.     Right Ear: External ear normal.     Left Ear: External ear normal.     Nose: Nose normal. No congestion.     Mouth/Throat:     Mouth: Mucous membranes are dry.     Pharynx: Oropharynx is clear. No  oropharyngeal exudate.  Eyes:     General:        Right eye: No discharge.        Left eye: No discharge.     Extraocular Movements: Extraocular movements intact.  Cardiovascular:     Rate and Rhythm: Normal rate. Rhythm irregular.     Heart sounds: Normal heart sounds. No murmur heard.    No gallop.  Pulmonary:     Effort: Pulmonary effort is normal. No respiratory distress.     Breath sounds: Normal breath sounds. No wheezing, rhonchi or rales.  Abdominal:     Comments: Soft, very TTP- no rebound; colostomy in place- 1/2 full of stool- loose Hypoactive BS  Musculoskeletal:     Cervical back: Neck supple. No tenderness.     Comments: Ue's 5-/5 throughout B/L and LE's- 5-/5 throughout B/L  Skin:    General: Skin is warm and dry.     Comments: No UB ecchymoses B/L R internal jugular line in place for HD Abd wound covered/Dry and intact VAC not on/in place  Neurological:     Mental Status: She is alert and oriented to person, place, and time.     Comments: Decreased to light touch in ankles and feet B/L- chronic due to neuropathy Ox3 Otherwise intact to light touch in hands as well as thighs and lower legs intact B/L No clonus; no hoffman's B/L; no increased tone Absent LE DTRs B/L Her slowed processing less evident- didn't see decreased safety awareness, but not doing therapy   Psychiatric:     Comments: Somewhat sad affect     Results for orders placed or performed during the hospital encounter of 10/27/23 (from the past 24 hours)  Glucose, capillary     Status: Abnormal   Collection Time: 11/11/23  3:34 PM  Result Value Ref Range   Glucose-Capillary 143 (H) 70 - 99 mg/dL  Glucose, capillary     Status: Abnormal   Collection Time: 11/11/23  7:42 PM  Result Value Ref Range   Glucose-Capillary 163 (H) 70 - 99 mg/dL  CBC     Status: Abnormal   Collection Time: 11/12/23  4:20 AM  Result Value Ref Range   WBC 22.0 (H) 4.0 - 10.5 K/uL   RBC 3.00 (L) 3.87 - 5.11 MIL/uL    Hemoglobin 8.0 (L) 12.0 - 15.0 g/dL   HCT 65.7 (L) 84.6 - 96.2 %   MCV 83.0 80.0 - 100.0 fL   MCH 26.7 26.0 - 34.0 pg   MCHC 32.1 30.0 - 36.0 g/dL   RDW 95.2 (H) 84.1 - 32.4 %   Platelets 326 150 - 400 K/uL   nRBC 0.0 0.0 - 0.2 %  Heparin level (unfractionated)     Status: Abnormal   Collection Time: 11/12/23  4:29 AM  Result Value Ref Range   Heparin Unfractionated 0.96 (H) 0.30 - 0.70 IU/mL  Type and screen     Status: None   Collection Time: 11/12/23  4:43 AM  Result Value Ref Range   ABO/RH(D) A POS    Antibody Screen NEG    Sample Expiration      11/15/2023,2359 Performed at [facility], [address], [place], [place] [address]   Glucose, capillary     Status: Abnormal   Collection Time: 11/12/23  9:15 AM  Result Value Ref Range   Glucose-Capillary 188 (H) 70 - 99 mg/dL   Comment 1 Notify RN   Glucose, capillary     Status: Abnormal   Collection Time: 11/12/23  1:27 PM  Result Value Ref Range   Glucose-Capillary 151 (H) 70 - 99 mg/dL   IR Fluoro Guide CV Line Right Result Date: 11/12/2023 CLINICAL DATA:  End-stage renal disease and status post placement of non tunneled temporary dialysis catheter on 11/01/2023. The patient now requires placement of a tunneled dialysis catheter for further longer-term hemodialysis. EXAM: TUNNELED CENTRAL VENOUS HEMODIALYSIS CATHETER PLACEMENT WITH ULTRASOUND AND FLUOROSCOPIC GUIDANCE ANESTHESIA/SEDATION: Moderate (conscious) sedation was employed during this procedure. A total of Versed 1.0 mg and Fentanyl 50 mcg was administered intravenously. Moderate Sedation Time: 16 minutes. The patient's level of consciousness and vital signs were monitored continuously by radiology nursing throughout the procedure under my direct supervision. MEDICATIONS: The patient is receiving IV Rocephin every 8 hours in the hospital and did not receive additional antibiotics for the procedure. FLUOROSCOPY: 24 seconds.  2.3 mGy. PROCEDURE: The procedure, risks,  benefits, and alternatives were explained to the patient. Questions regarding the procedure were encouraged and answered. The patient understands and consents to the procedure. A timeout was performed prior to initiating the procedure. A non tunneled temporary right-sided dialysis catheter was removed prior to the procedure and pressure held at the exit site. The right neck and chest were prepped with chlorhexidine in a sterile fashion, and a sterile drape was applied covering the operative field. Maximum barrier sterile technique with sterile gowns and gloves were used for the procedure. Local anesthesia was provided with 1% lidocaine. Ultrasound was used to confirm patency of the right internal jugular vein. An ultrasound image was saved and recorded. After creating a small venotomy incision, a 21 gauge needle was advanced into the right internal jugular vein under direct, real-time ultrasound guidance. Ultrasound image documentation was performed. After securing guidewire access, an 8 Fr dilator was placed. A J-wire was kinked to measure appropriate catheter length. A Palindrome tunneled hemodialysis catheter measuring 19 cm from tip to cuff was chosen for placement. This  was tunneled in a retrograde fashion from the chest wall to the venotomy incision. At the venotomy, serial dilatation was performed and a 15 Fr peel-away sheath was placed over a guidewire. The catheter was then placed through the sheath and the sheath removed. Final catheter positioning was confirmed and documented with a fluoroscopic spot image. The catheter was aspirated, flushed with saline, and injected with appropriate volume heparin dwells. The venotomy incision was closed with subcuticular 4-0 Vicryl. Dermabond was applied to the incision. The catheter exit site was secured with 0-Prolene retention sutures. COMPLICATIONS: None.  No pneumothorax. FINDINGS: After catheter placement, the tip lies in the right atrium. The catheter aspirates  normally and is ready for immediate use. IMPRESSION: Placement of tunneled hemodialysis catheter via new puncture of the right internal jugular vein after removal of the non tunneled catheter. The new catheter tip lies in the right atrium. The catheter is ready for immediate use. Electronically Signed   By: Irish Lack M.D.   On: 11/12/2023 10:45   IR US Guide Vasc Access Right Result Date: 11/12/2023 CLINICAL DATA:  End-stage renal disease and status post placement of non tunneled temporary dialysis catheter on 11/01/2023. The patient now requires placement of a tunneled dialysis catheter for further longer-term hemodialysis. EXAM: TUNNELED CENTRAL VENOUS HEMODIALYSIS CATHETER PLACEMENT WITH ULTRASOUND AND FLUOROSCOPIC GUIDANCE ANESTHESIA/SEDATION: Moderate (conscious) sedation was employed during this procedure. A total of Versed 1.0 mg and Fentanyl 50 mcg was administered intravenously. Moderate Sedation Time: 16 minutes. The patient's level of consciousness and vital signs were monitored continuously by radiology nursing throughout the procedure under my direct supervision. MEDICATIONS: The patient is receiving IV Rocephin every 8 hours in the hospital and did not receive additional antibiotics for the procedure. FLUOROSCOPY: 24 seconds.  2.3 mGy. PROCEDURE: The procedure, risks, benefits, and alternatives were explained to the patient. Questions regarding the procedure were encouraged and answered. The patient understands and consents to the procedure. A timeout was performed prior to initiating the procedure. A non tunneled temporary right-sided dialysis catheter was removed prior to the procedure and pressure held at the exit site. The right neck and chest were prepped with chlorhexidine in a sterile fashion, and a sterile drape was applied covering the operative field. Maximum barrier sterile technique with sterile gowns and gloves were used for the procedure. Local anesthesia was provided with 1%  lidocaine. Ultrasound was used to confirm patency of the right internal jugular vein. An ultrasound image was saved and recorded. After creating a small venotomy incision, a 21 gauge needle was advanced into the right internal jugular vein under direct, real-time ultrasound guidance. Ultrasound image documentation was performed. After securing guidewire access, an 8 Fr dilator was placed. A J-wire was kinked to measure appropriate catheter length. A Palindrome tunneled hemodialysis catheter measuring 19 cm from tip to cuff was chosen for placement. This was tunneled in a retrograde fashion from the chest wall to the venotomy incision. At the venotomy, serial dilatation was performed and a 15 Fr peel-away sheath was placed over a guidewire. The catheter was then placed through the sheath and the sheath removed. Final catheter positioning was confirmed and documented with a fluoroscopic spot image. The catheter was aspirated, flushed with saline, and injected with appropriate volume heparin dwells. The venotomy incision was closed with subcuticular 4-0 Vicryl. Dermabond was applied to the incision. The catheter exit site was secured with 0-Prolene retention sutures. COMPLICATIONS: None.  No pneumothorax. FINDINGS: After catheter placement, the tip lies in the right  atrium. The catheter aspirates normally and is ready for immediate use. IMPRESSION: Placement of tunneled hemodialysis catheter via new puncture of the right internal jugular vein after removal of the non tunneled catheter. The new catheter tip lies in the right atrium. The catheter is ready for immediate use. Electronically Signed   By: Irish Lack M.D.   On: 11/12/2023 10:45     Assessment/Plan: Diagnosis:  Debility due to diverticulitis and sigmoid colon perforation as well as new ESRD on new HD Does the need for close, 24 hr/day medical supervision in concert with the patient's rehab needs make it unreasonable for this patient to be served in  a less intensive setting? Yes Co-Morbidities requiring supervision/potential complications: new ESRD/HD, new colostomy, on renal diet; large abdominal wound- with possible VAC back on - pAfib on Heparin gtt- going to Eliquis tomorrow; HTN, CAD, DM neuropathy;  Due to bladder management, bowel management, safety, skin/wound care, disease management, medication administration, pain management, and patient education, does the patient require 24 hr/day rehab nursing? Yes Does the patient require coordinated care of a physician, rehab nurse, therapy disciplines of PT and OT to address physical and functional deficits in the context of the above medical diagnosis(es)? Yes Addressing deficits in the following areas: balance, endurance, locomotion, strength, transferring, bowel/bladder control, bathing, dressing, feeding, grooming, and toileting Can the patient actively participate in an intensive therapy program of at least 3 hrs of therapy per day at least 5 days per week? Yes The potential for patient to make measurable gains while on inpatient rehab is good Anticipated functional outcomes upon discharge from inpatient rehab are modified independent and supervision  with PT, modified independent and supervision with OT, n/a with SLP. Estimated rehab length of stay to reach the above functional goals is: 2-2.5 weeks due to medical issues? Anticipated discharge destination: Home Overall Rehab/Functional Prognosis: good  RECOMMENDATIONS: This patient's condition is appropriate for continued rehabilitative care in the following setting: CIR Patient has agreed to participate in recommended program. Yes Note that insurance prior authorization may be required for reimbursement for recommended care.  Comment:   Team trying to determine if putting VAC back on abd wound- also on Zosyn for now- not sure length of usage Hasn't been clipped yet- likely needs to be clipped before Rehab stay/CIR Poor appetite-  suggest Remeron 15 mg at bedtime for sleep as well as improved appetite- cannot heal without nutrition.   Changing to Eliquis tomorrow per primary team from heparin gtt? Obesity- don't want pt to lose weight right now since needs nutrition for wound healing- could also benefit from low dose Vit C/Zinc if ok with renal.  Suggest stopping her Dilaudid IV tomorrow and con't Oxy as needed for pain- unless needs IV dilaudid for VAC changes Will wait for therapy notes to determine functional goals- and will submit for Admissions coordinator for CIR.  Thank you for this consult.  9. Might need SLP if slowed processing doesn't completely improve- but will determine in CIR.   I spent a total of 67   minutes on total care today- >50% coordination of care- due to review of chart and multiple notes, labs, vitals, meds and orders- hx and exam and documentation as well as speaking with pt's nurse as well as admissions coordinator.    Genice Rouge, MD 11/12/2023

## 2023-11-12 NOTE — Progress Notes (Signed)
Requested to see pt for out-pt HD needs at d/c. Met with pt at bedside. Introduced self and explained role. Pt lives in [place] and prefers a clinic close to her home. Referral submitted to [facility] admissions for review this afternoon. Will assist as needed.   Olivia Canter Renal Navigator [phone]

## 2023-11-12 NOTE — Progress Notes (Signed)
PHARMACY - ANTICOAGULATION CONSULT NOTE  Pharmacy Consult for Heparin Indication: atrial fibrillation  Allergies  Allergen Reactions   Nsaids Other (See Comments)    CKD stage 4   Lisinopril Other (See Comments)    coughing   Peanut-Containing Drug Products Itching and Other (See Comments)    GI intolerance - diarrhea    Patient Measurements: Height: 5' 7.01" (170.2 cm) Weight: (S) 98.5 kg (217 lb 2.5 oz) (Bed Scale) IBW/kg (Calculated) : 61.62 Heparin Dosing Weight: 83 kg  Vital Signs: Temp: 97.7 F (36.5 C) (02/18 1121) Temp Source: Oral (02/18 1121) BP: 116/84 (02/18 1121) Pulse Rate: 80 (02/18 0850)  Labs: Recent Labs    11/10/23 0139 11/11/23 0327 11/12/23 0420 11/12/23 0429  HGB 7.9* 8.0* 8.0*  --   HCT 24.5* 24.8* 24.9*  --   PLT 300 331 326  --   HEPARINUNFRC 0.59 0.65  --  0.96*  CREATININE 6.44* 7.89*  --   --     Estimated Creatinine Clearance: 8.2 mL/min (A) (by C-G formula based on SCr of 7.89 mg/dL (H)). - new to HD this admit  Assessment: 30 yoF presents with abdominal pain with complicated diverticulitis w/ contained perforation and possible need for colectomy. PMH: CKD 5, A fib (apixaban), CAD, and CVA.  Pharmacy consulted to dose IV heparin for Afib. 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   Last apixaban dose 2/2 PTA.  Pt is s/p new HD cath. Ok to resume heparin at 5pm today then resume apixaban in AM.   Goal of Therapy:  Heparin level 0.3-0.7 units/ml Monitor platelets by anticoagulation protocol: Yes   Plan:  Resume heparin drip at 1700 units/hr. Check 8 hr HL Resume apixaban in AM  Ulyses Southward, PharmD, BCIDP, AAHIVP, CPP Infectious Disease Pharmacist 11/12/2023 4:40 PM

## 2023-11-12 NOTE — Progress Notes (Signed)
PROGRESS NOTE        PATIENT DETAILS Name: [name] Age: 68 y.o. Sex: female Date of Birth: 05/09/1956 Admit Date: 10/27/2023 Admitting Physician Dolly Rias, MD [id number], Devonne Doughty, NP  Brief Summary: Patient is a 68 y.o.  female with history of CKD 5, A-fib on Eliquis, CAD-who presented with abdominal pain-found to have acute diverticulitis with contained perforation involving the descending colon.  Hospital course complicated by worsening AKI-thought to have progressed to ESRD-requiring initiation of HD.  See below for further details.  Significant events: 2/2>> admit to [facility] 2/6>> CT abdomen worsening diverticulitis with perforation 2/7>> transitioned to full comfort measures-as with worsening AKI-however multiple family members arrived-comfort care revoked-now full code-full scope of treatment including HD/laparotomy with colostomy.  General surgery/nephrology reconsulted.  IR placed [facility] and patient underwent first hemodialysis.  2/8>> remains n.p.o-colectomy/colostomy later today.  Significant studies: 2/2>> CT abdomen/pelvis: Acute diverticulitis-descending colon with contained perforation 2/6>> CT abdomen/pelvis: Worsening diverticulitis of descending colon with perforation and increasing failure-suspected oral contrast extravasation left abdomen.  Significant microbiology data: 2/2>> COVID/influenza/RSV PCR: Negative  Procedures: 2/8>> by Dr. Freida Busman underwent exploratory laparotomy with Hartman's resection for perforated diverticulitis with diffuse peritonitis, colostomy in place. 2/13 - R IJ Trialysis catheter - IR 2/14 -left IJ Central line placed by IR for TNA 11/12/2023. R.internal jugular tunneled HD catheter placement by IR  Consults: Surgery Renal Palliative IR  Subjective: Patient in bed, appears comfortable, denies any headache, no fever, no chest pain or pressure, no shortness of breath , no abdominal pain. No new focal  weakness.    Objective: Vitals: Blood pressure (!) 143/111, pulse 80, temperature 98.4 F (36.9 C), temperature source Oral, resp. rate 15, height 5' 7.01" (1.702 m), weight (S) 98.5 kg, SpO2 97%.   Exam:  Awake Alert, No new F.N deficits, dark bilious secretion, colostomy bag in place, right IJ HD catheter,  [place].AT,[name], [name],   Symmetrical Chest wall movement, Good air movement bilaterally, CTAB RRR,No Gallops, Rubs or new Murmurs,  +ve B.Sounds, Abd Soft, No tenderness,   No Cyanosis, Clubbing or edema    Assessment/Plan:  Acute diverticulitis with contained perforation of the descending colon - Initially was managed conservatively, subsequently 2/8>> by Dr. Freida Busman general surgery she underwent exploratory laparotomy with Hartman's resection for perforated diverticulitis with diffuse peritonitis, colostomy in place, continue Zosyn, continue NG tube, defer further management of this issue to General surgery Case discussed with general surgery team on 11/05/2023, monitor closely on present treatment, does have rising leukocytosis but afebrile, repeat CT noted, case discussed with general surgery for now wait and watch and monitor.  An IV TNA started on 11/07/2023 evening, NG tube discontinued by general surgery on 11/09/2023, she has shown clinical improvement, has tolerated clear liquid diet well on 11/09/2023, advance to soft diet on 11/10/2023.  If tolerates.  TNA on 11/11/2023.  She has been afebrile for over 6 days, inflammatory markers trending down, appears nontoxic.   AKI on CKD 5 with progression to ESRD AKI likely hemodynamically mediated-has now likely progressed to ESRD.  HD initiated via right IJ temporary catheter, switched to right IJ trialysis catheter by IR on 11/07/2023>> tunneled HD catheter right IJ on 11/12/2023 by IR.  CAD Currently no anginal symptoms Suspect not on antiplatelets as on anticoagulation with Eliquis/heparin Continue beta-blocker On Repatha  injections as an outpatient.  PAF Sinus rhythm Continue Coreg Was on heparin drip will switch to Eliquis now that her catheter placement on 11/12/2023.  Anemia of acute illness  Brought on by acute illness, underlying renal insufficiency now developing to ESRD, surgical blood loss, gradually trending down, 2 units of packed RBC on 11/04/2023, on IV PPI. CBC looks stable on 11/05/2023 will continue to monitor closely.  HTN BP stable-placed on Coreg/amlodipine.  DM-2 (A1c 6.6 on 2/3) Of TNA now tolerating oral diet, Semglee and sliding scale adjusted.  Recent Labs    11/11/23 1534 11/11/23 1942 11/12/23 0915  GLUCAP 143* 163* 188*       Palliative care/goals of care Initially DNR-and wanted to do comfort care, now is full code wants to undergo aggressive treatment including surgery and dialysis.  Full code now.  Class 1 Obesity: Estimated body mass index is 34 kg/m as calculated from the following:   Height as of this encounter: 5' 7.01" (1.702 m).   Weight as of this encounter: 98.5 kg.    Code status:   Code Status: Limited: Do not attempt resuscitation (DNR) -DNR-LIMITED -Do Not Intubate/DNI    DVT Prophylaxis: Place and maintain sequential compression device Start: 11/06/23 0613 IV Heparin  Family Communication:  Verlene Mayer updated 2/7  Disposition Plan: Status is: Inpatient Remains inpatient appropriate because: Severity of illness   Planned Discharge Destination:Residential hospice   Diet: Diet Order             Diet renal with fluid restriction Fluid restriction: 1200 mL Fluid; Room service appropriate? Yes; Fluid consistency: Thin  Diet effective now                   MEDICATIONS: Scheduled Meds:  carvedilol  6.25 mg Oral BID WC   Chlorhexidine Gluconate Cloth  6 each Topical Q0600   darbepoetin (ARANESP) injection - DIALYSIS  60 mcg Subcutaneous Q Mon-1800   erythromycin  400 mg Oral Q6H   insulin aspart  0-15 Units Subcutaneous  TID WC   insulin aspart  0-5 Units Subcutaneous QHS   insulin glargine-yfgn  6 Units Subcutaneous Daily   mouth rinse  15 mL Mouth Rinse 4 times per day   pantoprazole (PROTONIX) IV  40 mg Intravenous Q12H   sodium chloride flush  10-40 mL Intracatheter Q12H   Continuous Infusions:  heparin 1,500 Units/hr (11/12/23 0613)   piperacillin-tazobactam (ZOSYN)  IV 2.25 g (11/12/23 0504)   PRN Meds:.acetaminophen **OR** acetaminophen, albuterol, hydrALAZINE, HYDROmorphone (DILAUDID) injection, menthol-cetylpyridinium, ondansetron (ZOFRAN) IV, mouth rinse, oxyCODONE   I have personally reviewed following labs and imaging studies  LABORATORY DATA:   Data Review:    Recent Labs  Lab 11/07/23 0319 11/08/23 0620 11/09/23 0958 11/10/23 0139 11/11/23 0327 11/12/23 0420  WBC 31.3* 27.8* 24.7* 21.5* 22.5* 22.0*  HGB 9.3* 9.2* 8.7* 7.9* 8.0* 8.0*  HCT 28.3* 28.2* 26.8* 24.5* 24.8* 24.9*  PLT 320 315 300 300 331 326  MCV 80.9 80.8 81.7 82.8 83.2 83.0  MCH 26.6 26.4 26.5 26.7 26.8 26.7  MCHC 32.9 32.6 32.5 32.2 32.3 32.1  RDW 16.0* 15.9* 16.0* 16.1* 16.7* 16.7*  LYMPHSABS 0.9 0.8 1.5 1.5 0.5*  --   MONOABS 1.9* 2.0* 0.7 3.9* 2.5*  --   EOSABS 0.3 0.4 0.5 0.2 0.7*  --   BASOSABS 0.0 0.2* 0.0 0.0 0.0  --     Recent Labs  Lab 11/06/23 0345 11/07/23 0319 11/07/23 1431 11/08/23 0620 11/09/23 0958 11/10/23 0139 11/11/23 0327  NA 134* 136  133* 134* 133* 137 137  K 3.8 3.9 4.1 3.1* 3.4* 3.4* 3.7  CL 93* 96* 97* 95* 95* 99 102  CO2 21* 20* 18* 22 25 24 22   ANIONGAP 20* 20* 18* 17* 13 14 13   GLUCOSE 254* 200* 216* 344* 358* 122* 161*  BUN 62* 38* 45* 55* 38* 45* 55*  CREATININE 7.45* 5.48* 6.34* 7.14* 5.43* 6.44* 7.89*  AST  --   --  16  --   --   --  23  ALT  --   --  14  --   --   --  17  ALKPHOS  --   --  47  --   --   --  39  BILITOT  --   --  1.6*  --   --   --  0.5  ALBUMIN  --   --  2.1*  --   --   --  1.7*  CRP 19.3* 17.8*  --  16.2* 13.7* 12.1*  --   PROCALCITON 25.65  19.62  --  17.70 16.20 15.86 12.67  BNP 244.4*  --   --   --   --   --   --   MG 2.3 2.1  --  2.1 1.8 1.9 1.8  PHOS  --   --  5.5* 4.6 2.6 2.3* 2.2*  CALCIUM 7.5* 7.6* 7.6* 7.4* 7.3* 7.3* 7.5*      Recent Labs  Lab 11/06/23 0345 11/07/23 0319 11/07/23 1431 11/08/23 0620 11/09/23 0958 11/10/23 0139 11/11/23 0327  CRP 19.3* 17.8*  --  16.2* 13.7* 12.1*  --   PROCALCITON 25.65 19.62  --  17.70 16.20 15.86 12.67  BNP 244.4*  --   --   --   --   --   --   MG 2.3 2.1  --  2.1 1.8 1.9 1.8  CALCIUM 7.5* 7.6* 7.6* 7.4* 7.3* 7.3* 7.5*    --------------------------------------------------------------------------------------------------------------- Lab Results  Component Value Date   CHOL 134 12/21/2019   HDL 49 12/21/2019   LDLCALC 69 12/21/2019   TRIG 148 11/11/2023   CHOLHDL 2.7 12/21/2019    Lab Results  Component Value Date   HGBA1C 6.6 (H) 10/28/2023      Micro Results Recent Results (from the past 240 hours)  MRSA Next Gen by PCR, Nasal     Status: None   Collection Time: 11/10/23  5:14 AM   Specimen: Nasal Mucosa; Nasal Swab  Result Value Ref Range Status   MRSA by PCR Next Gen NOT DETECTED NOT DETECTED Final    Comment: (NOTE) The GeneXpert MRSA Assay (FDA approved for NASAL specimens only), is one component of a comprehensive MRSA colonization surveillance program. It is not intended to diagnose MRSA infection nor to guide or monitor treatment for MRSA infections. Test performance is not FDA approved in patients less than 71 years old. Performed at [facility], [address] [place], [place] [address]      Radiology Reports IR US Guide Vasc Access Left Result Date: 11/08/2023 INDICATION: ESRD.  IV antibiotics. EXAM: ULTRASOUND AND FLUOROSCOPIC GUIDED PLACEMENT OF TUNNELED CENTRAL VENOUS "Powerline" CATHETER MEDICATIONS: The patient was on scheduled IV antibiotics. ANESTHESIA/SEDATION: Local anesthetic was administered. The patient was continuously  monitored during the procedure by the interventional radiology nurse under my direct supervision. FLUOROSCOPY TIME:  Fluoroscopic dose; 3.8 mGy COMPLICATIONS: None immediate. PROCEDURE: Informed written consent was obtained from the patient and/or patient's representative after a discussion of the risks,  benefits, and alternatives to treatment. Questions regarding the procedure were encouraged and answered. The LEFT neck and chest were prepped with chlorhexidine in a sterile fashion, and a sterile drape was applied covering the operative field. Maximum barrier sterile technique with sterile gowns and gloves were used for the procedure. A timeout was performed prior to the initiation of the procedure. After the overlying soft tissues were anesthetized, a small venotomy incision was created and a micropuncture kit was utilized to access the internal jugular vein. Real-time ultrasound guidance was utilized for vascular access including the acquisition of a permanent ultrasound image documenting patency of the accessed vessel. The microwire was utilized to measure appropriate catheter length. The micropuncture sheath was exchanged for a peel-away sheath over a guidewire. A 5 Fr dual lumen tunneled central venous catheter measuring 33 cm was tunneled in a retrograde fashion from the anterior chest wall to the venotomy incision. The catheter was then placed through the peel-away sheath with tip ultimately positioned at the superior caval-atrial junction. Final catheter positioning was confirmed and documented with a spot radiographic image. The catheter aspirates and flushes normally. The catheter was flushed with appropriate volume heparin dwells. The catheter exit site was secured with a 3-0 Ethilon retention suture. The venotomy incision was closed with Dermabond. Dressings were applied. The patient tolerated the procedure well without immediate post procedural complication. FINDINGS: After catheter placement, the tip  lies within the superior apect of the right atrium. The catheter aspirates and flushes normally and is ready for immediate use. IMPRESSION: Successful placement of 33 cm dual lumen tunneled "Powerline" central venous catheter via the LEFT internal jugular vein The tip of the catheter is positioned within the proximal RIGHT atrium. The catheter is ready for immediate use. Roanna Banning, MD [facility] Radiology Electronically Signed   By: Roanna Banning M.D.   On: 11/08/2023 18:10   IR Fluoro Guide CV Line Left Result Date: 11/08/2023 INDICATION: ESRD.  IV antibiotics. EXAM: ULTRASOUND AND FLUOROSCOPIC GUIDED PLACEMENT OF TUNNELED CENTRAL VENOUS "Powerline" CATHETER MEDICATIONS: The patient was on scheduled IV antibiotics. ANESTHESIA/SEDATION: Local anesthetic was administered. The patient was continuously monitored during the procedure by the interventional radiology nurse under my direct supervision. FLUOROSCOPY TIME:  Fluoroscopic dose; 3.8 mGy COMPLICATIONS: None immediate. PROCEDURE: Informed written consent was obtained from the patient and/or patient's representative after a discussion of the risks, benefits, and alternatives to treatment. Questions regarding the procedure were encouraged and answered. The LEFT neck and chest were prepped with chlorhexidine in a sterile fashion, and a sterile drape was applied covering the operative field. Maximum barrier sterile technique with sterile gowns and gloves were used for the procedure. A timeout was performed prior to the initiation of the procedure. After the overlying soft tissues were anesthetized, a small venotomy incision was created and a micropuncture kit was utilized to access the internal jugular vein. Real-time ultrasound guidance was utilized for vascular access including the acquisition of a permanent ultrasound image documenting patency of the accessed vessel. The microwire was utilized to measure  appropriate catheter length. The micropuncture sheath was exchanged for a peel-away sheath over a guidewire. A 5 Fr dual lumen tunneled central venous catheter measuring 33 cm was tunneled in a retrograde fashion from the anterior chest wall to the venotomy incision. The catheter was then placed through the peel-away sheath with tip ultimately positioned at the superior caval-atrial junction. Final catheter positioning was confirmed and documented with a spot radiographic image. The catheter aspirates  and flushes normally. The catheter was flushed with appropriate volume heparin dwells. The catheter exit site was secured with a 3-0 Ethilon retention suture. The venotomy incision was closed with Dermabond. Dressings were applied. The patient tolerated the procedure well without immediate post procedural complication. FINDINGS: After catheter placement, the tip lies within the superior apect of the right atrium. The catheter aspirates and flushes normally and is ready for immediate use. IMPRESSION: Successful placement of 33 cm dual lumen tunneled "Powerline" central venous catheter via the LEFT internal jugular vein The tip of the catheter is positioned within the proximal RIGHT atrium. The catheter is ready for immediate use. Roanna Banning, MD [facility] Radiology Electronically Signed   By: Roanna Banning M.D.   On: 11/08/2023 18:10   VAS [place] LOWER EXTREMITY VENOUS (DVT) Result Date: 11/07/2023  Lower Venous DVT Study Patient Name:  LEAR CARSTENS [facility]  Date of Exam:   11/07/2023 Medical Rec #: [id number]          Accession #:    [id number] Date of Birth: Aug 07, 1956          Patient Gender: F Patient Age:   11 years Exam Location:  [facility] Procedure:      VAS [place] LOWER EXTREMITY VENOUS (DVT) Referring Phys: Tresa Endo OSBORNE --------------------------------------------------------------------------------  Indications: Pain, stroke, and A-fib, anemia.  Comparison  Study: No prior exam. Performing Technologist: Fernande Bras  Examination Guidelines: A complete evaluation includes B-mode imaging, spectral Doppler, color Doppler, and power Doppler as needed of all accessible portions of each vessel. Bilateral testing is considered an integral part of a complete examination. Limited examinations for reoccurring indications may be performed as noted. The reflux portion of the exam is performed with the patient in reverse Trendelenburg.  +---------+---------------+---------+-----------+----------+--------------+ RIGHT    CompressibilityPhasicitySpontaneityPropertiesThrombus Aging +---------+---------------+---------+-----------+----------+--------------+ CFV      Full           Yes      Yes                                 +---------+---------------+---------+-----------+----------+--------------+ SFJ      Full           Yes      Yes                                 +---------+---------------+---------+-----------+----------+--------------+ FV Prox  Full                                                        +---------+---------------+---------+-----------+----------+--------------+ FV Mid   Full                                                        +---------+---------------+---------+-----------+----------+--------------+ FV DistalFull                                                        +---------+---------------+---------+-----------+----------+--------------+  PFV      Full                                                        +---------+---------------+---------+-----------+----------+--------------+ POP      Full           Yes      Yes                                 +---------+---------------+---------+-----------+----------+--------------+ PTV      Full                                                        +---------+---------------+---------+-----------+----------+--------------+ PERO     Full                                                         +---------+---------------+---------+-----------+----------+--------------+   +---------+---------------+---------+-----------+----------+--------------+ LEFT     CompressibilityPhasicitySpontaneityPropertiesThrombus Aging +---------+---------------+---------+-----------+----------+--------------+ CFV      Full           Yes      Yes                                 +---------+---------------+---------+-----------+----------+--------------+ SFJ      Full           Yes      Yes                                 +---------+---------------+---------+-----------+----------+--------------+ FV Prox  Full                                                        +---------+---------------+---------+-----------+----------+--------------+ FV Mid   Full                                                        +---------+---------------+---------+-----------+----------+--------------+ FV DistalFull                                                        +---------+---------------+---------+-----------+----------+--------------+ PFV      Full                                                        +---------+---------------+---------+-----------+----------+--------------+  POP      Full           Yes      Yes                                 +---------+---------------+---------+-----------+----------+--------------+ PTV      Full                                                        +---------+---------------+---------+-----------+----------+--------------+ PERO     Full                                                        +---------+---------------+---------+-----------+----------+--------------+     Summary: BILATERAL: - No evidence of deep vein thrombosis seen in the lower extremities, bilaterally. -No evidence of popliteal cyst, bilaterally.   *See table(s) above for measurements and observations. Electronically  signed by Heath Lark on 11/07/2023 at 4:05:14 PM.    Final    CT ABDOMEN PELVIS WO CONTRAST Result Date: 11/06/2023 CLINICAL DATA:  Diverticulitis, complication suspected EXAM: CT ABDOMEN AND PELVIS WITHOUT CONTRAST TECHNIQUE: Multidetector CT imaging of the abdomen and pelvis was performed following the standard protocol without IV contrast. RADIATION DOSE REDUCTION: This exam was performed according to the departmental dose-optimization program which includes automated exposure control, adjustment of the mA and/or kV according to patient size and/or use of iterative reconstruction technique. COMPARISON:  10/31/2023 FINDINGS: Lower chest: Volume loss and opacity in the lower lungs consistent with atelectasis. There is a central line with tip at the upper right atrium. Coronary atherosclerosis. Hepatobiliary: No focal liver abnormality. Calcific in the intermediate density in the gallbladder attributed to stones. No biliary dilatation or pericholecystic edema. Pancreas: Unremarkable. Spleen: Unremarkable. Adrenals/Urinary Tract: Negative adrenals. No hydronephrosis or stone. Renal cortical thinning and lobulation at the left upper pole. 3 mm stone at the upper pole left kidney. The urinary bladder is collapsed around a Foley catheter. Stomach/Bowel: Descending colostomy. Bowel anastomosis in the right abdomen. No evidence of bowel obstruction. Fluid collection with loculation appearance/peritoneal thickening along the left flank measuring up to 5 x 6 x 10 cm. Milder fluid accumulation in the ventral peritoneal space. Vascular/Lymphatic: Extensive atheromatous calcification of the aorta and iliacs. No mass or adenopathy. Reproductive:No pathologic findings. Other: No ascites or pneumoperitoneum. Musculoskeletal: No acute abnormalities.  T9 butterfly vertebra IMPRESSION: 1. Interval colostomy with fluid collection along the left gutter, dominant pocket measuring 10 x 6 x 5 cm. 2. No bowel obstruction. 3.  Cholelithiasis and left nephrolithiasis. Electronically Signed   By: Tiburcio Pea M.D.   On: 11/06/2023 07:12   DG Chest Port 1 View Result Date: 11/03/2023 CLINICAL DATA:  Shortness of breath EXAM: PORTABLE CHEST - 1 VIEW COMPARISON:  11/11/2018 FINDINGS: Unchanged mild cardiomegaly and pulmonary vascular congestion. Interval worsening of left basilar opacity likely due to atelectasis. Lungs otherwise clear. Examination is limited due to expiratory phase of imaging. Interval placement of temporary right IJ hemodialysis catheter which terminates in the region of the right atrium. Nasogastric tube terminates in the left upper quadrant. IMPRESSION: Interval worsening of left basilar  opacity which is likely due to atelectasis. Electronically Signed   By: Acquanetta Belling M.D.   On: 11/03/2023 07:22   IR Fluoro Guide CV Line Right Result Date: 11/01/2023 INDICATION: ESRD requiring HD. EXAM: NON-TUNNELED CENTRAL VENOUS HEMODIALYSIS CATHETER PLACEMENT WITH ULTRASOUND AND FLUOROSCOPIC GUIDANCE COMPARISON:  CT AP 10/31/2023. MEDICATIONS: Local anesthetic was administered. FLUOROSCOPY TIME:  Fluoroscopic dose; 35 mGy COMPLICATIONS: None immediate. PROCEDURE: Informed written consent was obtained from the patient and/or patient's representative after a discussion of the risks, benefits, and alternatives to treatment. Questions regarding the procedure were encouraged and answered. The RIGHT neck and chest were prepped with chlorhexidine in a sterile fashion, and a sterile drape was applied covering the operative field. Maximum barrier sterile technique with sterile gowns and gloves were used for the procedure. A timeout was performed prior to the initiation of the procedure. After the overlying soft tissues were anesthetized, a small venotomy incision was created and a micropuncture kit was utilized to access the external jugular vein. Real-time ultrasound guidance was utilized for vascular access including the acquisition  of a permanent ultrasound image documenting patency of the accessed vessel. The microwire was utilized to measure appropriate catheter length. A stiff glidewire was advanced to the level of the IVC. Under fluoroscopic guidance, the venotomy was serially dilated, ultimately allowing placement of a 20 cm temporary Trialysis catheter with tip ultimately terminating within the superior aspect of the right atrium. Final catheter positioning was confirmed and documented with a spot radiographic image. The catheter aspirates and flushes normally. The catheter was flushed with appropriate volume heparin dwells. The catheter exit site was secured with a 2-0 Ethilon retention suture. A dressing was placed. The patient tolerated the procedure well without immediate post procedural complication. IMPRESSION: Successful placement of a RIGHT EXTERNAL jugular approach 20 cm non-tunneled dialysis catheter The tip of the catheter is positioned within the proximal RIGHT atrium. The catheter is ready for immediate use. PLAN: This catheter may be converted to a tunneled dialysis catheter at a later date as indicated. Roanna Banning, MD [facility] Radiology Electronically Signed   By: Roanna Banning M.D.   On: 11/01/2023 18:15   IR US Guide Vasc Access Right Result Date: 11/01/2023 INDICATION: ESRD requiring HD. EXAM: NON-TUNNELED CENTRAL VENOUS HEMODIALYSIS CATHETER PLACEMENT WITH ULTRASOUND AND FLUOROSCOPIC GUIDANCE COMPARISON:  CT AP 10/31/2023. MEDICATIONS: Local anesthetic was administered. FLUOROSCOPY TIME:  Fluoroscopic dose; 35 mGy COMPLICATIONS: None immediate. PROCEDURE: Informed written consent was obtained from the patient and/or patient's representative after a discussion of the risks, benefits, and alternatives to treatment. Questions regarding the procedure were encouraged and answered. The RIGHT neck and chest were prepped with chlorhexidine in a sterile fashion, and a  sterile drape was applied covering the operative field. Maximum barrier sterile technique with sterile gowns and gloves were used for the procedure. A timeout was performed prior to the initiation of the procedure. After the overlying soft tissues were anesthetized, a small venotomy incision was created and a micropuncture kit was utilized to access the external jugular vein. Real-time ultrasound guidance was utilized for vascular access including the acquisition of a permanent ultrasound image documenting patency of the accessed vessel. The microwire was utilized to measure appropriate catheter length. A stiff glidewire was advanced to the level of the IVC. Under fluoroscopic guidance, the venotomy was serially dilated, ultimately allowing placement of a 20 cm temporary Trialysis catheter with tip ultimately terminating within the superior aspect of the right atrium. Final catheter positioning  was confirmed and documented with a spot radiographic image. The catheter aspirates and flushes normally. The catheter was flushed with appropriate volume heparin dwells. The catheter exit site was secured with a 2-0 Ethilon retention suture. A dressing was placed. The patient tolerated the procedure well without immediate post procedural complication. IMPRESSION: Successful placement of a RIGHT EXTERNAL jugular approach 20 cm non-tunneled dialysis catheter The tip of the catheter is positioned within the proximal RIGHT atrium. The catheter is ready for immediate use. PLAN: This catheter may be converted to a tunneled dialysis catheter at a later date as indicated. Roanna Banning, MD [facility] Radiology Electronically Signed   By: Roanna Banning M.D.   On: 11/01/2023 18:15   CT ABDOMEN PELVIS WO CONTRAST Result Date: 10/31/2023 CLINICAL DATA:  Diverticulitis, complication suspected. EXAM: CT ABDOMEN AND PELVIS WITHOUT CONTRAST TECHNIQUE: Multidetector CT imaging of the abdomen  and pelvis was performed following the standard protocol without IV contrast. RADIATION DOSE REDUCTION: This exam was performed according to the departmental dose-optimization program which includes automated exposure control, adjustment of the mA and/or kV according to patient size and/or use of iterative reconstruction technique. COMPARISON:  10/27/2023. FINDINGS: Lower chest: Heart is enlarged and multi-vessel coronary artery calcifications are noted. Consolidation and patchy airspace disease is present at the lung bases bilaterally. Hepatobiliary: No focal liver abnormality is seen. Stones are present within the gallbladder. No biliary ductal dilatation. Pancreas: Unremarkable. No pancreatic ductal dilatation or surrounding inflammatory changes. Spleen: Normal in size without focal abnormality. Adrenals/Urinary Tract: The adrenal glands are within normal limits. Calcifications are present in the upper pole of the left kidney. There is a cyst in the lower pole of the right kidney. No hydronephrosis bilaterally. The bladder is unremarkable. Stomach/Bowel: The stomach is within normal limits. No bowel obstruction or pneumatosis is seen. Right hemicolectomy changes are noted. There is diffuse colonic wall thickening. Scattered diverticula are present along the colon. A few small foci of free air are noted in the mesentery in the mid left abdomen, slightly increased from the previous exam. There is suggestion of contrast extravasation in the region. There is fine no definite abscess is seen. Vascular/Lymphatic: Aortic atherosclerosis. No enlarged abdominal or pelvic lymph nodes. Reproductive: No acute abnormality. Other: Mild ascites is noted in all 4 quadrants. A fat containing umbilical hernia is noted. There is mild anasarca. Musculoskeletal: The bony structures are stable. IMPRESSION: 1. Worsening diverticulitis of the descending colon with perforation and increasing free air and suspected oral contrast  extravasation in the mid left abdomen. No definite abscess is seen. Surgical consultation is recommended. 2. Mild ascites. 3. Diffuse colonic wall thickening which may be due to associated inflammatory changes. 4. Patchy airspace disease at the lung bases with bilateral lower lobe consolidation. 5. Cholelithiasis. 6. Aortic atherosclerosis. Critical Value/emergent results were called by telephone at the time of interpretation on 10/31/2023 at 11:59 pm to provider Dr. Antionette Char, who verbally acknowledged these results. Electronically Signed   By: Thornell Sartorius M.D.   On: 10/31/2023 23:59   CT ABDOMEN PELVIS WO CONTRAST Result Date: 10/27/2023 CLINICAL DATA:  Acute nonlocalized abdominal pain. Chills and fever. Vomiting and diarrhea. EXAM: CT ABDOMEN AND PELVIS WITHOUT CONTRAST TECHNIQUE: Multidetector CT imaging of the abdomen and pelvis was performed following the standard protocol without IV contrast. RADIATION DOSE REDUCTION: This exam was performed according to the departmental dose-optimization program which includes automated exposure control, adjustment of the mA and/or kV according to patient size and/or use  of iterative reconstruction technique. COMPARISON:  CT abdomen and pelvis 04/19/2021 FINDINGS: Lower chest: Bibasilar atelectasis.  No acute abnormality. Hepatobiliary: Cholelithiasis without evidence of acute cholecystitis. No biliary dilation. Unremarkable noncontrast appearance of the liver. Pancreas: Unremarkable. Spleen: Unremarkable. Adrenals/Urinary Tract: Normal adrenal glands. No urinary calculi or hydronephrosis. Unremarkable bladder. Stomach/Bowel: Stomach is within normal limits. No bowel obstruction. Colonic diverticulosis. Wall thickening, inflammatory stranding, and free fluid about the descending colon. Loosely organized fluid and gas about the inflamed descending colon (circa series 3/image 49-50) compatible with contained perforation. The adjacent jejunum is inflamed. Postoperative change  about the colon with ileocolonic anastomosis in the right upper quadrant. Vascular/Lymphatic: Aortic atherosclerosis. No enlarged abdominal or pelvic lymph nodes. Reproductive: No acute abnormality. Other: Fat containing periumbilical hernia. Musculoskeletal: No acute fracture. IMPRESSION: 1. Acute diverticulitis of the descending colon with contained perforation. 2. Inflamed jejunum adjacent to the perforated diverticulitis. 3.  Aortic Atherosclerosis (ICD10-I70.0). Critical Value/emergent results were called by telephone at the time of interpretation on 10/27/2023 at 9:22 pm to provider [employer] , who verbally acknowledged these results. Electronically Signed   By: Minerva Fester M.D.   On: 10/27/2023 21:24    Signature  -   Susa Raring M.D on 11/12/2023 at 9:31 AM   -  To page go to [email or web address]

## 2023-11-12 NOTE — Progress Notes (Signed)
Inpatient Rehab Admissions Coordinator:   Met with patient at bedside.  Tolerating soft diet.  TDC in this morning, plan for ongoing HD.  Pt still wants to pursue CIR so will start insurance auth request today once therapy has a chance to see her.    Estill Dooms, PT, DPT Admissions Coordinator [phone] 11/12/23  10:44 AM

## 2023-11-12 NOTE — Progress Notes (Signed)
PHARMACY - ANTICOAGULATION CONSULT NOTE  Pharmacy Consult for heparin Indication: atrial fibrillation  Labs: Recent Labs    11/09/23 0958 11/10/23 0139 11/11/23 0327 11/12/23 0420 11/12/23 0429  HGB 8.7* 7.9* 8.0* 8.0*  --   HCT 26.8* 24.5* 24.8* 24.9*  --   PLT 300 300 331 326  --   HEPARINUNFRC 0.52 0.59 0.65  --  0.96*  CREATININE 5.43* 6.44* 7.89*  --   --    Assessment: 68yo female supratherapeutic on heparin after several levels at goal though had been trending up; no infusion issues or signs of bleeding per RN.  Plan for [facility] in IR today, schedule currently unknown.  Goal of Therapy:  Heparin level 0.3-0.7 units/ml   Plan:  Decrease heparin infusion by 3 units/kg/hr to 1500 units/hr. Check level in 8 hours vs f/u after IR.   Vernard Gambles, PharmD, BCPS 11/12/2023 5:45 AM

## 2023-11-12 NOTE — Plan of Care (Signed)
Pt has rested quietly throughout the night with no distress noted. Alert and oriented. On room air. SR on the monitor. No urine. Left chest CVC intact with dressing CDI. Right dialysis cath with dressing CDI. NPO for procedure this am. Colostomy with green drainage. Medicated once for pain with relief noted. No other complaints voiced.     Problem: Pain Managment: Goal: General experience of comfort will improve and/or be controlled Outcome: Progressing   Problem: Education: Goal: Knowledge of the prescribed therapeutic regimen will improve Outcome: Progressing   Problem: Coping: Goal: Ability to identify and develop effective coping behavior will improve Outcome: Progressing   Problem: Role Relationship: Goal: Family's ability to cope with current situation will improve Outcome: Progressing   Problem: Pain Management: Goal: Satisfaction with pain management regimen will improve Outcome: Progressing

## 2023-11-12 NOTE — Progress Notes (Signed)
OT Cancellation Note  Patient Details Name: [name] MRN: [id number] DOB: [birth year]   Cancelled Treatment:    Reason Eval/Treat Not Completed: Patient at procedure or test/ unavailable (TDC in IR. OT plans to f/u later today for treatment session.)  Donia Pounds 11/12/2023, 9:43 AM

## 2023-11-12 NOTE — Procedures (Signed)
Interventional Radiology Procedure Note  Procedure: Removal of temp cath; placement of new tunneled right internal jugular vein tunneled HD catheter  Complications: None  Estimated Blood Loss: < 10 mL  Findings: 19 cm tip to cuff length Palindrome catheter via right internal jugular vein. Tip in RA. OK to use.  Jodi Marble. Fredia Sorrow, M.D Pager:  [phone]

## 2023-11-12 NOTE — Progress Notes (Signed)
Progress Note  10 Days Post-Op  Subjective: Had IR cath placement this am. Feeling sore from moving around. Has not had anything to eat today but denies nausea or emesis with food the last couple days. Had some bleeding from midline incision following IR procedure and vac removed. Some bleeding and clots noted with vac removal  Objective: Vital signs in last 24 hours: Temp:  [97.7 F (36.5 C)-98.7 F (37.1 C)] 97.7 F (36.5 C) (02/18 1121) Pulse Rate:  [70-80] 80 (02/18 0850) Resp:  [0-21] 15 (02/18 0850) BP: (112-172)/(56-111) 116/84 (02/18 1121) SpO2:  [92 %-99 %] 97 % (02/18 0850) Weight:  [98.5 kg] 98.5 kg (02/17 1244) Last BM Date : 11/12/23  Intake/Output from previous day: 02/17 0701 - 02/18 0700 In: 0  Out: 1050 [Stool:50] Intake/Output this shift: No intake/output data recorded.  PE: General: pleasant, WD, obese female who is laying in bed in NAD Lungs: Respiratory effort nonlabored Abd: soft, appropriately ttp, midline wound as below, ostomy functioning with stool in bag Psych: A&Ox3 with an appropriate affect.     Lab Results:  Recent Labs    11/11/23 0327 11/12/23 0420  WBC 22.5* 22.0*  HGB 8.0* 8.0*  HCT 24.8* 24.9*  PLT 331 326   BMET Recent Labs    11/10/23 0139 11/11/23 0327  NA 137 137  K 3.4* 3.7  CL 99 102  CO2 24 22  GLUCOSE 122* 161*  BUN 45* 55*  CREATININE 6.44* 7.89*  CALCIUM 7.3* 7.5*   PT/INR No results for input(s): "LABPROT", "INR" in the last 72 hours. CMP     Component Value Date/Time   NA 137 11/11/2023 0327   NA 137 02/28/2021 1548   K 3.7 11/11/2023 0327   CL 102 11/11/2023 0327   CO2 22 11/11/2023 0327   GLUCOSE 161 (H) 11/11/2023 0327   BUN 55 (H) 11/11/2023 0327   BUN 33 (H) 02/28/2021 1548   CREATININE 7.89 (H) 11/11/2023 0327   CREATININE 3.38 (H) 12/26/2015 1347   CALCIUM 7.5 (L) 11/11/2023 0327   PROT 5.8 (L) 11/11/2023 0327   PROT 7.0 11/07/2017 1414   ALBUMIN 1.7 (L) 11/11/2023 0327   ALBUMIN  4.0 11/07/2017 1414   AST 23 11/11/2023 0327   ALT 17 11/11/2023 0327   ALKPHOS 39 11/11/2023 0327   BILITOT 0.5 11/11/2023 0327   BILITOT 0.2 11/07/2017 1414   GFRNONAA 5 (L) 11/11/2023 0327   GFRNONAA 13 (L) 01/10/2015 1428   GFRAA 21 (L) 12/21/2019 1802   GFRAA 16 (L) 01/10/2015 1428   Lipase     Component Value Date/Time   LIPASE 26 10/27/2023 1936       Studies/Results: IR Fluoro Guide CV Line Right Result Date: 11/12/2023 CLINICAL DATA:  End-stage renal disease and status post placement of non tunneled temporary dialysis catheter on 11/01/2023. The patient now requires placement of a tunneled dialysis catheter for further longer-term hemodialysis. EXAM: TUNNELED CENTRAL VENOUS HEMODIALYSIS CATHETER PLACEMENT WITH ULTRASOUND AND FLUOROSCOPIC GUIDANCE ANESTHESIA/SEDATION: Moderate (conscious) sedation was employed during this procedure. A total of Versed 1.0 mg and Fentanyl 50 mcg was administered intravenously. Moderate Sedation Time: 16 minutes. The patient's level of consciousness and vital signs were monitored continuously by radiology nursing throughout the procedure under my direct supervision. MEDICATIONS: The patient is receiving IV Rocephin every 8 hours in the hospital and did not receive additional antibiotics for the procedure. FLUOROSCOPY: 24 seconds.  2.3 mGy. PROCEDURE: The procedure, risks, benefits, and alternatives were explained to the  patient. Questions regarding the procedure were encouraged and answered. The patient understands and consents to the procedure. A timeout was performed prior to initiating the procedure. A non tunneled temporary right-sided dialysis catheter was removed prior to the procedure and pressure held at the exit site. The right neck and chest were prepped with chlorhexidine in a sterile fashion, and a sterile drape was applied covering the operative field. Maximum barrier sterile technique with sterile gowns and gloves were used for the procedure.  Local anesthesia was provided with 1% lidocaine. Ultrasound was used to confirm patency of the right internal jugular vein. An ultrasound image was saved and recorded. After creating a small venotomy incision, a 21 gauge needle was advanced into the right internal jugular vein under direct, real-time ultrasound guidance. Ultrasound image documentation was performed. After securing guidewire access, an 8 Fr dilator was placed. A J-wire was kinked to measure appropriate catheter length. A Palindrome tunneled hemodialysis catheter measuring 19 cm from tip to cuff was chosen for placement. This was tunneled in a retrograde fashion from the chest wall to the venotomy incision. At the venotomy, serial dilatation was performed and a 15 Fr peel-away sheath was placed over a guidewire. The catheter was then placed through the sheath and the sheath removed. Final catheter positioning was confirmed and documented with a fluoroscopic spot image. The catheter was aspirated, flushed with saline, and injected with appropriate volume heparin dwells. The venotomy incision was closed with subcuticular 4-0 Vicryl. Dermabond was applied to the incision. The catheter exit site was secured with 0-Prolene retention sutures. COMPLICATIONS: None.  No pneumothorax. FINDINGS: After catheter placement, the tip lies in the right atrium. The catheter aspirates normally and is ready for immediate use. IMPRESSION: Placement of tunneled hemodialysis catheter via new puncture of the right internal jugular vein after removal of the non tunneled catheter. The new catheter tip lies in the right atrium. The catheter is ready for immediate use. Electronically Signed   By: Irish Lack M.D.   On: 11/12/2023 10:45   IR US Guide Vasc Access Right Result Date: 11/12/2023 CLINICAL DATA:  End-stage renal disease and status post placement of non tunneled temporary dialysis catheter on 11/01/2023. The patient now requires placement of a tunneled dialysis  catheter for further longer-term hemodialysis. EXAM: TUNNELED CENTRAL VENOUS HEMODIALYSIS CATHETER PLACEMENT WITH ULTRASOUND AND FLUOROSCOPIC GUIDANCE ANESTHESIA/SEDATION: Moderate (conscious) sedation was employed during this procedure. A total of Versed 1.0 mg and Fentanyl 50 mcg was administered intravenously. Moderate Sedation Time: 16 minutes. The patient's level of consciousness and vital signs were monitored continuously by radiology nursing throughout the procedure under my direct supervision. MEDICATIONS: The patient is receiving IV Rocephin every 8 hours in the hospital and did not receive additional antibiotics for the procedure. FLUOROSCOPY: 24 seconds.  2.3 mGy. PROCEDURE: The procedure, risks, benefits, and alternatives were explained to the patient. Questions regarding the procedure were encouraged and answered. The patient understands and consents to the procedure. A timeout was performed prior to initiating the procedure. A non tunneled temporary right-sided dialysis catheter was removed prior to the procedure and pressure held at the exit site. The right neck and chest were prepped with chlorhexidine in a sterile fashion, and a sterile drape was applied covering the operative field. Maximum barrier sterile technique with sterile gowns and gloves were used for the procedure. Local anesthesia was provided with 1% lidocaine. Ultrasound was used to confirm patency of the right internal jugular vein. An ultrasound image was saved and recorded. After  creating a small venotomy incision, a 21 gauge needle was advanced into the right internal jugular vein under direct, real-time ultrasound guidance. Ultrasound image documentation was performed. After securing guidewire access, an 8 Fr dilator was placed. A J-wire was kinked to measure appropriate catheter length. A Palindrome tunneled hemodialysis catheter measuring 19 cm from tip to cuff was chosen for placement. This was tunneled in a retrograde fashion  from the chest wall to the venotomy incision. At the venotomy, serial dilatation was performed and a 15 Fr peel-away sheath was placed over a guidewire. The catheter was then placed through the sheath and the sheath removed. Final catheter positioning was confirmed and documented with a fluoroscopic spot image. The catheter was aspirated, flushed with saline, and injected with appropriate volume heparin dwells. The venotomy incision was closed with subcuticular 4-0 Vicryl. Dermabond was applied to the incision. The catheter exit site was secured with 0-Prolene retention sutures. COMPLICATIONS: None.  No pneumothorax. FINDINGS: After catheter placement, the tip lies in the right atrium. The catheter aspirates normally and is ready for immediate use. IMPRESSION: Placement of tunneled hemodialysis catheter via new puncture of the right internal jugular vein after removal of the non tunneled catheter. The new catheter tip lies in the right atrium. The catheter is ready for immediate use. Electronically Signed   By: Irish Lack M.D.   On: 11/12/2023 10:45    Anti-infectives: Anti-infectives (From admission, onward)    Start     Dose/Rate Route Frequency Ordered Stop   11/08/23 1200  erythromycin (EES) 400 MG/5ML suspension 400 mg  Status:  Discontinued        400 mg Oral Every 6 hours 11/08/23 0815 11/12/23 1111   11/06/23 0815  piperacillin-tazobactam (ZOSYN) IVPB 2.25 g        2.25 g 100 mL/hr over 30 Minutes Intravenous Every 8 hours 11/06/23 0729     11/01/23 1415  piperacillin-tazobactam (ZOSYN) IVPB 2.25 g        2.25 g 100 mL/hr over 30 Minutes Intravenous Every 8 hours 11/01/23 1316 11/06/23 0602   10/29/23 1100  piperacillin-tazobactam (ZOSYN) IVPB 2.25 g  Status:  Discontinued        2.25 g 100 mL/hr over 30 Minutes Intravenous Every 8 hours 10/29/23 1006 11/01/23 1016   10/28/23 0600  piperacillin-tazobactam (ZOSYN) IVPB 2.25 g  Status:  Discontinued        2.25 g 100 mL/hr over 30  Minutes Intravenous Every 8 hours 10/27/23 2224 10/27/23 2235   10/28/23 0600  ceFEPIme (MAXIPIME) 1 g in sodium chloride 0.9 % 100 mL IVPB  Status:  Discontinued        1 g 200 mL/hr over 30 Minutes Intravenous Every 24 hours 10/27/23 2235 10/29/23 0954   10/28/23 0600  metroNIDAZOLE (FLAGYL) IVPB 500 mg  Status:  Discontinued        500 mg 100 mL/hr over 60 Minutes Intravenous Every 12 hours 10/27/23 2235 10/29/23 0954   10/27/23 2130  piperacillin-tazobactam (ZOSYN) IVPB 3.375 g        3.375 g 100 mL/hr over 30 Minutes Intravenous  Once 10/27/23 2128 10/27/23 2320        Assessment/Plan  POD 10, s/p Hartmann's by Dr. Freida Busman 11/02/23 forSigmoid diverticulitis with perforation  - WBC stable at 22;  duplex of LEs = neg. Hgb stable at 8 but with bleeding from midline this am and vac removed. Plan to transition from hep gtt to eliquis today - recc hold eliquis till tomorrow.  Monitor wound with wet to dry today and possible vac replacement tomorrow - was NPO for procedure. Can resume renal diet. No nausea recently. Discussed with TRH and pharm and can dc erythromycin (for GI motility) - therapies for mobilization. - VAC to midline wound, M/Th sched. Off for now - WOC for colostomy care     FEN: renal diet, TNA stopped VTE: heparin gtt - hgb stable, transition to eliquis possibly tomorrow ID: cefepime/flagyl 2/3>2/4; Zosyn 2/4>>   - per TRH -  PAF  HTN HLD AKI on CKD stage V - on HD CAD T2DM   LOS: 16 days     Eric Form, [facility] Surgery 11/12/2023, 11:58 AM Please see Amion for pager number during day hours 7:00am-4:30pm

## 2023-11-12 NOTE — Progress Notes (Signed)
Occupational Therapy Treatment Patient Details Name: [name] MRN: [id number] DOB: [birth year] Today's Date: 11/12/2023   History of present illness Patient is a 68 y.o.  female - who presented with abdominal pain-found to have acute diverticulitis with contained perforation involving the descending colon. 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  Past medical history of CKD 5, A-fib on Eliquis, CAD   OT comments  Pt is making good progress towards acute OT goals. Pt continues to be limited by deficits listed below. Overall, pt required MIN A for bed mobility tasks using log roll technique. Pt performed STS transfers from EOB to RW with MIN A with great recall of hand placement. Functional step pivot transfers performed from EOB to [facility] using RW with MIN A and verbal cues for sequencing. Pt completed toilet task with CGA while standing unilaterally at RW to perform peri care. No LOB observed. Pt completed toilet task on RA maintaining O2 sats ~88-89%. Pt with increased fatigue after completing self-care task. Abdominal wound assessed before and after session with no increased bleeding or drainage observed. OT to continue following pt acutely to address functional needs with the recommendation of follow-up OT services >3hrs/day to maximize functional independence.      If plan is discharge home, recommend the following:  A lot of help with walking and/or transfers;A lot of help with bathing/dressing/bathroom;Assistance with cooking/housework;Direct supervision/assist for financial management;Direct supervision/assist for medications management;Assist for transportation;Help with stairs or ramp for entrance   Equipment Recommendations  Other (comment)    Recommendations for Other Services PT consult;Rehab consult    Precautions / Restrictions Precautions Precautions: Fall Recall of Precautions/Restrictions: Intact Restrictions Weight Bearing Restrictions Per Provider Order:  No       Mobility Bed Mobility Overal bed mobility: Needs Assistance Bed Mobility: Rolling, Sidelying to Sit, Sit to Sidelying Rolling: Min assist, Used rails Sidelying to sit: Min assist, Used rails     Sit to sidelying: Min assist, Used rails General bed mobility comments: cues for logroll technique and min A for trunk elevation. Pt able to lateral scoot towards HOB.    Transfers Overall transfer level: Needs assistance Equipment used: Rolling walker (2 wheels) Transfers: Sit to/from Stand, Bed to chair/wheelchair/BSC Sit to Stand: Min assist     Step pivot transfers: Min assist     General transfer comment: Pt able to recall hand placement when performing STS. Pt required MIN A and verbal cues for sequencing when pivoting to surfaces     Balance Overall balance assessment: Needs assistance Sitting-balance support: Feet supported, Bilateral upper extremity supported Sitting balance-Leahy Scale: Good Sitting balance - Comments: static sitting EOB   Standing balance support: Single extremity supported, During functional activity Standing balance-Leahy Scale: Fair Standing balance comment: completed peri care while standing supported unilaterally at RW                           ADL either performed or assessed with clinical judgement   ADL Overall ADL's : Needs assistance/impaired                         Toilet Transfer: Stand-pivot;BSC/3in1;Rolling walker (2 wheels);Minimal assistance   Toileting- Clothing Manipulation and Hygiene: Contact guard assist;Sit to/from stand Toileting - Clothing Manipulation Details (indicate cue type and reason): standing unilateral at RW     Functional mobility during ADLs: Minimal assistance General ADL Comments: Pt completed bed mobility with MIN  A using log roll technique, verbal cues provided for sequencing. Pt performed step pivot transfer from EOB to BSC using RW with MIN A. Pt able to safely complete peri  care while standing supported unilaterally at RW with CGA.    Extremity/Trunk Assessment Upper Extremity Assessment Upper Extremity Assessment: Overall WFL for tasks assessed   Lower Extremity Assessment Lower Extremity Assessment: Defer to PT evaluation        Vision   Vision Assessment?: No apparent visual deficits   Perception Perception Perception: Not tested   Praxis Praxis Praxis: Not tested   Communication Communication Communication: No apparent difficulties   Cognition Arousal: Alert Behavior During Therapy: WFL for tasks assessed/performed Cognition: No apparent impairments             OT - Cognition Comments: Pt answered all questions appropriately. Able to follow multi-step commands and expressed needs                 Following commands: Intact        Cueing   Cueing Techniques: Verbal cues  Exercises      Shoulder Instructions       General Comments VSS on 2L of O2    Pertinent Vitals/ Pain       Pain Assessment Pain Assessment: Faces Faces Pain Scale: Hurts a little bit Pain Location: abdomen Pain Descriptors / Indicators: Discomfort Pain Intervention(s): Monitored during session, Limited activity within patient's tolerance  Home Living                                          Prior Functioning/Environment              Frequency  Min 1X/week        Progress Toward Goals  OT Goals(current goals can now be found in the care plan section)  Progress towards OT goals: Progressing toward goals  Acute Rehab OT Goals Patient Stated Goal: to sit EOB OT Goal Formulation: With patient Time For Goal Achievement: 11/17/23 Potential to Achieve Goals: Good ADL Goals Pt Will Perform Upper Body Dressing: with contact guard assist;sitting Pt Will Perform Lower Body Dressing: with min assist;with adaptive equipment;sitting/lateral leans;sit to/from stand Pt Will Transfer to Toilet: with contact guard  assist;ambulating;regular height toilet Additional ADL Goal #1: Pt will tolerate OOB activity x10 min in prep for ADLs  Plan      Co-evaluation                 AM-PAC OT "6 Clicks" Daily Activity     Outcome Measure   Help from another person eating meals?: Total Help from another person taking care of personal grooming?: A Little Help from another person toileting, which includes using toliet, bedpan, or urinal?: A Little Help from another person bathing (including washing, rinsing, drying)?: A Lot Help from another person to put on and taking off regular upper body clothing?: A Little Help from another person to put on and taking off regular lower body clothing?: A Lot 6 Click Score: 14    End of Session Equipment Utilized During Treatment: Rolling walker (2 wheels)  OT Visit Diagnosis: Unsteadiness on feet (R26.81);Other abnormalities of gait and mobility (R26.89);Muscle weakness (generalized) (M62.81)   Activity Tolerance Patient tolerated treatment well   Patient Left in bed;with call bell/phone within reach   Nurse Communication Mobility status  Time: 0454-0981 OT Time Calculation (min): 20 min  Charges: OT General Charges $OT Visit: 1 Visit OT Treatments $Self Care/Home Management : 8-22 mins  Lynnda Shields 11/12/2023, 3:48 PM

## 2023-11-12 NOTE — Progress Notes (Signed)
Patient ID: [name], female   DOB: 07-Feb-1956, 68 y.o.   MRN: [id number] [facility] Progress Note   Assessment/ Plan:   1.  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  After initial refusal of wanting to pursue renal replacement therapy, she changed her mind and asked to start dialysis with the intention of undergoing surgery for contained perforation of acute diverticulitis.   -Maintain dialysis on MWF schedule -TDC w/ IR on 2/18, appreciate help -Palliative care has been involved and patient wishes to continue with dialysis for now.   -Start CLIP process -Would hold on permanent access creation for now given overall clinical picture 2.  Acute diverticulitis with contained perforation of descending colon: On Zosyn and now status post exploratory laparotomy with Hartman's resection for perforated diverticulitis/peritonitis (2/8).  Management per surgery and primary team 3.  Anion gap metabolic acidosis: Resolved with renal replacement therapy 4.  Anemia: Has received blood and IV iron. ESA also ordered  Subjective:    Patient feels tired but no other complaints. Got HD cath this am   Objective:   BP 116/84 (BP Location: Right Arm)   Pulse 80   Temp 97.7 F (36.5 C) (Oral)   Resp 15   Ht 5' 7.01" (1.702 m)   Wt (S) 98.5 kg Comment: Bed Scale  SpO2 97%   BMI 34.00 kg/m   Intake/Output Summary (Last 24 hours) at 11/12/2023 1230 Last data filed at 11/12/2023 1123 Gross per 24 hour  Intake 0 ml  Output 1050 ml  Net -1050 ml   Weight change: 3.4 kg  Physical Exam: Gen: Lying in bed with no distress CVS: Normal rate, no rub Resp: Bilateral chest rise with no increased work of breathing Abd: Minimal distention, colostomy bag in place Ext: Trace ankle edema.  Warm and well-perfused  Imaging: IR Fluoro Guide CV Line  Right Result Date: 11/12/2023 CLINICAL DATA:  End-stage renal disease and status post placement of non tunneled temporary dialysis catheter on 11/01/2023. The patient now requires placement of a tunneled dialysis catheter for further longer-term hemodialysis. EXAM: TUNNELED CENTRAL VENOUS HEMODIALYSIS CATHETER PLACEMENT WITH ULTRASOUND AND FLUOROSCOPIC GUIDANCE ANESTHESIA/SEDATION: Moderate (conscious) sedation was employed during this procedure. A total of Versed 1.0 mg and Fentanyl 50 mcg was administered intravenously. Moderate Sedation Time: 16 minutes. The patient's level of consciousness and vital signs were monitored continuously by radiology nursing throughout the procedure under my direct supervision. MEDICATIONS: The patient is receiving IV Rocephin every 8 hours in the hospital and did not receive additional antibiotics for the procedure. FLUOROSCOPY: 24 seconds.  2.3 mGy. PROCEDURE: The procedure, risks, benefits, and alternatives were explained to the patient. Questions regarding the procedure were encouraged and answered. The patient understands and consents to the procedure. A timeout was performed prior to initiating the procedure. A non tunneled temporary right-sided dialysis catheter was removed prior to the procedure and pressure held at the exit site. The right neck and chest were prepped with chlorhexidine in a sterile fashion, and a sterile drape was applied covering the operative field. Maximum barrier sterile technique with sterile gowns and gloves were used for the procedure. Local anesthesia was provided with 1% lidocaine. Ultrasound was used to confirm patency of the right internal jugular vein. An ultrasound image was saved and recorded. After creating a small venotomy incision, a 21 gauge needle was advanced into the right internal jugular vein  under direct, real-time ultrasound guidance. Ultrasound image documentation was performed. After securing guidewire access, an 8 Fr dilator was  placed. A J-wire was kinked to measure appropriate catheter length. A Palindrome tunneled hemodialysis catheter measuring 19 cm from tip to cuff was chosen for placement. This was tunneled in a retrograde fashion from the chest wall to the venotomy incision. At the venotomy, serial dilatation was performed and a 15 Fr peel-away sheath was placed over a guidewire. The catheter was then placed through the sheath and the sheath removed. Final catheter positioning was confirmed and documented with a fluoroscopic spot image. The catheter was aspirated, flushed with saline, and injected with appropriate volume heparin dwells. The venotomy incision was closed with subcuticular 4-0 Vicryl. Dermabond was applied to the incision. The catheter exit site was secured with 0-Prolene retention sutures. COMPLICATIONS: None.  No pneumothorax. FINDINGS: After catheter placement, the tip lies in the right atrium. The catheter aspirates normally and is ready for immediate use. IMPRESSION: Placement of tunneled hemodialysis catheter via new puncture of the right internal jugular vein after removal of the non tunneled catheter. The new catheter tip lies in the right atrium. The catheter is ready for immediate use. Electronically Signed   By: Irish Lack M.D.   On: 11/12/2023 10:45   IR US Guide Vasc Access Right Result Date: 11/12/2023 CLINICAL DATA:  End-stage renal disease and status post placement of non tunneled temporary dialysis catheter on 11/01/2023. The patient now requires placement of a tunneled dialysis catheter for further longer-term hemodialysis. EXAM: TUNNELED CENTRAL VENOUS HEMODIALYSIS CATHETER PLACEMENT WITH ULTRASOUND AND FLUOROSCOPIC GUIDANCE ANESTHESIA/SEDATION: Moderate (conscious) sedation was employed during this procedure. A total of Versed 1.0 mg and Fentanyl 50 mcg was administered intravenously. Moderate Sedation Time: 16 minutes. The patient's level of consciousness and vital signs were monitored  continuously by radiology nursing throughout the procedure under my direct supervision. MEDICATIONS: The patient is receiving IV Rocephin every 8 hours in the hospital and did not receive additional antibiotics for the procedure. FLUOROSCOPY: 24 seconds.  2.3 mGy. PROCEDURE: The procedure, risks, benefits, and alternatives were explained to the patient. Questions regarding the procedure were encouraged and answered. The patient understands and consents to the procedure. A timeout was performed prior to initiating the procedure. A non tunneled temporary right-sided dialysis catheter was removed prior to the procedure and pressure held at the exit site. The right neck and chest were prepped with chlorhexidine in a sterile fashion, and a sterile drape was applied covering the operative field. Maximum barrier sterile technique with sterile gowns and gloves were used for the procedure. Local anesthesia was provided with 1% lidocaine. Ultrasound was used to confirm patency of the right internal jugular vein. An ultrasound image was saved and recorded. After creating a small venotomy incision, a 21 gauge needle was advanced into the right internal jugular vein under direct, real-time ultrasound guidance. Ultrasound image documentation was performed. After securing guidewire access, an 8 Fr dilator was placed. A J-wire was kinked to measure appropriate catheter length. A Palindrome tunneled hemodialysis catheter measuring 19 cm from tip to cuff was chosen for placement. This was tunneled in a retrograde fashion from the chest wall to the venotomy incision. At the venotomy, serial dilatation was performed and a 15 Fr peel-away sheath was placed over a guidewire. The catheter was then placed through the sheath and the sheath removed. Final catheter positioning was confirmed and documented with a fluoroscopic spot image. The catheter was aspirated, flushed with saline, and  injected with appropriate volume heparin dwells. The  venotomy incision was closed with subcuticular 4-0 Vicryl. Dermabond was applied to the incision. The catheter exit site was secured with 0-Prolene retention sutures. COMPLICATIONS: None.  No pneumothorax. FINDINGS: After catheter placement, the tip lies in the right atrium. The catheter aspirates normally and is ready for immediate use. IMPRESSION: Placement of tunneled hemodialysis catheter via new puncture of the right internal jugular vein after removal of the non tunneled catheter. The new catheter tip lies in the right atrium. The catheter is ready for immediate use. Electronically Signed   By: Irish Lack M.D.   On: 11/12/2023 10:45      Labs: BMET Recent Labs  Lab 11/06/23 0345 11/07/23 0319 11/07/23 1431 11/08/23 0620 11/09/23 0958 11/10/23 0139 11/11/23 0327  NA 134* 136 133* 134* 133* 137 137  K 3.8 3.9 4.1 3.1* 3.4* 3.4* 3.7  CL 93* 96* 97* 95* 95* 99 102  CO2 21* 20* 18* 22 25 24 22   GLUCOSE 254* 200* 216* 344* 358* 122* 161*  BUN 62* 38* 45* 55* 38* 45* 55*  CREATININE 7.45* 5.48* 6.34* 7.14* 5.43* 6.44* 7.89*  CALCIUM 7.5* 7.6* 7.6* 7.4* 7.3* 7.3* 7.5*  PHOS  --   --  5.5* 4.6 2.6 2.3* 2.2*   CBC Recent Labs  Lab 11/08/23 0620 11/09/23 0958 11/10/23 0139 11/11/23 0327 11/12/23 0420  WBC 27.8* 24.7* 21.5* 22.5* 22.0*  NEUTROABS 22.9* 22.0* 15.9* 18.9*  --   HGB 9.2* 8.7* 7.9* 8.0* 8.0*  HCT 28.2* 26.8* 24.5* 24.8* 24.9*  MCV 80.8 81.7 82.8 83.2 83.0  PLT 315 300 300 331 326    Medications:     [START ON 11/13/2023] apixaban  5 mg Oral BID   carvedilol  6.25 mg Oral BID WC   Chlorhexidine Gluconate Cloth  6 each Topical Q0600   darbepoetin (ARANESP) injection - DIALYSIS  60 mcg Subcutaneous Q Mon-1800   insulin aspart  0-15 Units Subcutaneous TID WC   insulin aspart  0-5 Units Subcutaneous QHS   insulin glargine-yfgn  12 Units Subcutaneous Daily   mouth rinse  15 mL Mouth Rinse 4 times per day   pantoprazole (PROTONIX) IV  40 mg Intravenous Q12H    sodium chloride flush  10-40 mL Intracatheter Q12H   Darnell Level  11/12/2023, 12:30 PM

## 2023-11-12 NOTE — PMR Pre-admission (Shared)
PMR Admission Coordinator Pre-Admission Assessment  Patient: [name] is an 68 y.o., female MRN: [id number] DOB: [birth year] Height: 5' 7.01" (170.2 cm) Weight: (S) 98.5 kg (Bed Scale)              Insurance Information HMO:     PPO: yes     PCP:      IPA:      80/20:      OTHER:  PRIMARY: [employer] PPO      Policy#: [id number]      Subscriber: pt CM Name: ***      Phone#: ***     Fax#: *** Pre-Cert#: ***      Employer: *** Benefits:  Phone #: ***     Name: *** Dolores Hoose. Date: ***     Deduct: ***      Out of Pocket Max: ***      Life Max: ***  CIR: ***      SNF: *** Outpatient: ***     Co-Pay: *** Home Health: ***      Co-Pay: *** DME: ***     Co-Pay: *** Providers: *** SECONDARY:       Policy#:       Phone#:   Financial Counselor:       Phone#:   The "Data Collection Information Summary" for patients in Inpatient Rehabilitation Facilities with attached "Privacy Act Statement-Health Care Records" was provided and verbally reviewed with: Patient and Family  Emergency Contact Information Contact Information     Name Relation Home Work Mobile   [place] Son [phone]     Ralene Muskrat [phone]  [phone]      Other Contacts   None on File    Current Medical History  Patient Admitting Diagnosis: debility, new ESRD, 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  Vitals reassuring in ED.  Labs notable for bicarb 18, anion gap 16, creatinine 7, BUN 77, glucose 34, WBC 16, hgb 9.  Imaging revealed complicated diverticulitis with contained perforation.  Consults to general surgery and nephrology.  Initially recommended for conservative treatment with bowel rest and zosyn, but  repeat CT on 2/6 showed ongoing extraluminal gas and concern for contrast leakage so recommendations were for operative management.  She underwent an ex lap with left colectomy and end colostomy placement on 2/8 with Dr. Freida Busman.  Post op WOC consult for midline wound vac and ostomy care.  Hgb stable but some bleeding from midline with vac changes M/Th.  Transition to eliquis planned for 2/19.  Nephrology following for AKI on CKD stage V.  She was initially resistant to HD, but ultimately agreeable and currently dialyzing MWF.  TDC placed on 2/18.  Therapy ongoing and pt was recommended for CIR.    Glasgow Coma Scale Score: 15  Patient's medical record from [facility] has been reviewed by the rehabilitation admission coordinator and physician.  Past Medical History  Past Medical History:  Diagnosis Date   Anemia    Arthritis    Atrial fibrillation (HCC)    CAD (coronary artery disease)    Chronic kidney disease, stage III (moderate) (HCC)    Constipation    Diabetes mellitus (HCC)    History of blood  transfusion    HLD (hyperlipidemia)    Hypertension    Myocardial infarction [facility])    in April 2014    Has the patient had major surgery during 100 days prior to admission? Yes  Family History  family history includes Cervical cancer in her sister; Colon cancer (age of onset: 21) in her mother; Congestive Heart Failure in her sister; Diabetes in her brother, mother, and sister; Heart disease in her father and maternal grandmother; Hypertension in her father; Kidney disease in her brother; Sickle cell anemia in her son; Thyroid disease in her son.   Current Medications   Current Facility-Administered Medications:    acetaminophen (TYLENOL) tablet 650 mg, 650 mg, Oral, Q6H PRN, 650 mg at 11/01/23 1656 **OR** acetaminophen (TYLENOL) suppository 650 mg, 650 mg, Rectal, Q6H PRN, Ghimire, Werner Lean, MD   albuterol (PROVENTIL) (2.5 MG/3ML) 0.083% nebulizer solution 2.5 mg, 2.5 mg, Nebulization,  Q2H PRN, Ghimire, Werner Lean, MD   Melene Muller ON 11/13/2023] apixaban (ELIQUIS) tablet 5 mg, 5 mg, Oral, BID, Kabrich, Martha H, PA-C   carvedilol (COREG) tablet 6.25 mg, 6.25 mg, Oral, BID WC, Susa Raring K, MD, 6.25 mg at 11/12/23 4540   Chlorhexidine Gluconate Cloth 2 % PADS 6 each, 6 each, Topical, Q0600, Dagoberto Ligas, MD, 6 each at 11/12/23 0515   Darbepoetin Alfa (ARANESP) injection 60 mcg, 60 mcg, Subcutaneous, Q Mon-1800, Darnell Level, MD, 60 mcg at 11/11/23 1842   hydrALAZINE (APRESOLINE) injection 10 mg, 10 mg, Intravenous, Q6H PRN, Maretta Bees, MD, 10 mg at 11/08/23 2008   HYDROmorphone (DILAUDID) injection 0.5 mg, 0.5 mg, Intravenous, Q4H PRN, Susa Raring K, MD, 0.5 mg at 11/12/23 0943   insulin aspart (novoLOG) injection 0-15 Units, 0-15 Units, Subcutaneous, TID WC, Leroy Sea, MD, 3 Units at 11/12/23 1328   insulin aspart (novoLOG) injection 0-5 Units, 0-5 Units, Subcutaneous, QHS, Leroy Sea, MD, 3 Units at 11/11/23 2234   insulin glargine-yfgn (SEMGLEE) injection 12 Units, 12 Units, Subcutaneous, Daily, Leroy Sea, MD, 12 Units at 11/12/23 1328   menthol-cetylpyridinium (CEPACOL) lozenge 3 mg, 1 lozenge, Oral, PRN, Leroy Sea, MD, 3 mg at 11/05/23 0705   ondansetron (ZOFRAN) injection 4 mg, 4 mg, Intravenous, Q6H PRN, Maretta Bees, MD, 4 mg at 11/08/23 1603   Oral care mouth rinse, 15 mL, Mouth Rinse, 4 times per day, Maretta Bees, MD, 15 mL at 11/11/23 2233   Oral care mouth rinse, 15 mL, Mouth Rinse, PRN, Ghimire, Werner Lean, MD   oxyCODONE (Oxy IR/ROXICODONE) immediate release tablet 5-10 mg, 5-10 mg, Oral, Q4H PRN, Juliet Rude, PA-C, 10 mg at 11/12/23 0925   pantoprazole (PROTONIX) injection 40 mg, 40 mg, Intravenous, Q12H, Leroy Sea, MD, 40 mg at 11/12/23 0925   piperacillin-tazobactam (ZOSYN) IVPB 2.25 g, 2.25 g, Intravenous, Q8H, Barnetta Chapel, PA-C, Last Rate: 100 mL/hr at 11/12/23 0504, 2.25 g at 11/12/23  0504   sodium chloride flush (NS) 0.9 % injection 10-40 mL, 10-40 mL, Intracatheter, Q12H, Ghimire, Shanker M, MD, 10 mL at 11/12/23 9811  Patients Current Diet:  Diet Order             Diet renal with fluid restriction Fluid restriction: 1200 mL Fluid; Room service appropriate? Yes; Fluid consistency: Thin  Diet effective now                   Precautions / Restrictions Precautions Precautions: Fall Precaution/Restrictions Comments: NGT Restrictions Weight Bearing Restrictions Per Provider  Order: No   Has the patient had 2 or more falls or a fall with injury in the past year?No  Prior Activity Level Limited Community (1-2x/wk): indep without device, not driving due to baseline visual deficits, lives with 3 sons  Prior Functional Level Prior Function Prior Level of Function : Independent/Modified Independent Mobility Comments: ind ADLs Comments: ind  Self Care: Did the patient need help bathing, dressing, using the toilet or eating?  Independent  Indoor Mobility: Did the patient need assistance with walking from room to room (with or without device)? Independent  Stairs: Did the patient need assistance with internal or external stairs (with or without device)? Independent  Functional Cognition: Did the patient need help planning regular tasks such as shopping or remembering to take medications? Independent  Patient Information Are you of Hispanic, Latino/a,or Spanish origin?: A. No, not of Hispanic, Latino/a, or Spanish origin What is your race?: B. Black or African American Do you need or want an interpreter to communicate with a doctor or health care staff?: 0. No  Patient's Response To:  Health Literacy and Transportation Is the patient able to respond to health literacy and transportation needs?: Yes Health Literacy - How often do you need to have someone help you when you read instructions, pamphlets, or other written material from your doctor or pharmacy?:  Never In the past 12 months, has lack of transportation kept you from medical appointments or from getting medications?: No In the past 12 months, has lack of transportation kept you from meetings, work, or from getting things needed for daily living?: No  Journalist, newspaper / Equipment Home Equipment: Gilmer Mor - single point, Wheelchair - manual, Agricultural consultant (2 wheels), Other (comment), Shower seat  Prior Device Use: Indicate devices/aids used by the patient prior to current illness, exacerbation or injury? None of the above  Current Functional Level Cognition  Overall Cognitive Status: Within Functional Limits for tasks assessed Orientation Level: Oriented X4 General Comments: eyes closed, slow responses, pt states she feels globally weak    Extremity Assessment (includes Sensation/Coordination)  Upper Extremity Assessment: Overall WFL for tasks assessed  Lower Extremity Assessment: Defer to PT evaluation    ADLs  Overall ADL's : Needs assistance/impaired Eating/Feeding: NPO Grooming: Minimal assistance Upper Body Bathing: Moderate assistance Lower Body Bathing: Maximal assistance Upper Body Dressing : Moderate assistance Lower Body Dressing: Maximal assistance Toilet Transfer: Minimal assistance, Rolling walker (2 wheels) Toileting- Clothing Manipulation and Hygiene: Moderate assistance Functional mobility during ADLs: Minimal assistance General ADL Comments: Pt required MIN A for bed mobility tasks using log roll technique. Pt able to step pivot from bed to chair with HHA +2 for line management    Mobility  Overal bed mobility: Needs Assistance Bed Mobility: Supine to Sit Supine to sit: Min assist, Used rails Sit to supine: Mod assist General bed mobility comments: cues for logroll technique and min A for trunk elevation    Transfers  Overall transfer level: Needs assistance Equipment used: 2 person hand held assist Transfers: Sit to/from Stand Sit to Stand: Min  assist Bed to/from chair/wheelchair/BSC transfer type:: Step pivot Step pivot transfers: Min assist, +2 safety/equipment General transfer comment: Pt performed sit to stand transfer with MIN A with HHA. Pt able to step pivot towards chair with HHA and verbal cues for sequencing. Pt required verbal cues for hand placement when descending to have a seat.    Ambulation / Gait / Stairs / Wheelchair Mobility  Ambulation/Gait Ambulation/Gait assistance: Holiday representative  Distance (Feet): 10 Feet (+5) Assistive device: Rolling walker (2 wheels) Gait Pattern/deviations: Wide base of support, Drifts right/left, Decreased stride length, Step-through pattern, Trunk flexed General Gait Details: Pt requires frequent cues for RW proximity and upright posture. Intermittent min A for RW management for obstacle negotiation and due to pt tending to get R sock stuck under back RW leg. Increased B knee instability with progressed distance. 1 seated rest break due to fatigue Gait velocity: decreased    Posture / Balance Dynamic Sitting Balance Sitting balance - Comments: static sitting EOB Balance Overall balance assessment: Needs assistance Sitting-balance support: Feet supported, Bilateral upper extremity supported Sitting balance-Leahy Scale: Good Sitting balance - Comments: static sitting EOB Standing balance support: Bilateral upper extremity supported, During functional activity Standing balance-Leahy Scale: Poor Standing balance comment: HHA step pivot transfer to chair    Special needs/care consideration Dialysis: Hemodialysis Monday, Wednesday, and Friday, Oxygen 2L, Wound Vac midline, change M/Th, Skin midline wound, ostomy, and Diabetic management yes     Previous Home Environment (from acute therapy documentation) Living Arrangements: Children (son) Available Help at Discharge: Family, Available 24 hours/day Type of Home: House Home Layout: Other (Comment), Bed/bath upstairs,  Multi-level Alternate Level Stairs-Rails: Left Alternate Level Stairs-Number of Steps: 4 - to kitchen and bedroom Home Access: Stairs to enter Entrance Stairs-Rails: Can reach both Entrance Stairs-Number of Steps: 8, no steps through the side Bathroom Shower/Tub: Associate Professor: Yes Home Care Services: No  Discharge Living Setting Plans for Discharge Living Setting: Patient's home, Lives with (comment) (3 adult sons) Type of Home at Discharge: House Discharge Home Layout: Bed/bath upstairs, Multi-level Alternate Level Stairs-Rails: Left Alternate Level Stairs-Number of Steps: 4 to kitchen/bed/bath Discharge Home Access: Stairs to enter, Level entry (level entry through side entrance) Entrance Stairs-Rails: Left, Right Entrance Stairs-Number of Steps: 8 Discharge Bathroom Shower/Tub: Tub/shower unit Discharge Bathroom Toilet: Standard Discharge Bathroom Accessibility: Yes How Accessible: Accessible via walker Does the patient have any problems obtaining your medications?: No  Social/Family/Support Systems Anticipated Caregiver: son, Asher Muir, is primary contact Anticipated Caregiver's Contact Information: [phone] Ability/Limitations of Caregiver: none stated Caregiver Availability: 24/7 Discharge Plan Discussed with Primary Caregiver: Yes Is Caregiver In Agreement with Plan?: Yes Does Caregiver/Family have Issues with Lodging/Transportation while Pt is in Rehab?: No   Goals Patient/Family Goal for Rehab: PT/OT supervision to mod I, SLP n/a Expected length of stay: 10-14 days Additional Information: Discharge plan: return home with pt's 3 adults sons able to provide 24/7 supervision if needed Pt/Family Agrees to Admission and willing to participate: Yes Program Orientation Provided & Reviewed with Pt/Caregiver Including Roles  & Responsibilities: Yes   Decrease burden of Care through IP rehab admission: n/a   Possible  need for SNF placement upon discharge: Not anticipated.  Expect discharge home with 3 sons providing 24/7 if needed.   Patient Condition: This patient's condition remains as documented in the consult dated 11/12/2023, in which the Rehabilitation Physician determined and documented that the patient's condition is appropriate for intensive rehabilitative care in an inpatient rehabilitation facility. Will admit to inpatient rehab today.  Preadmission Screen Completed By:  Stephania Fragmin, PT, DPT 11/12/2023 1:31 PM ______________________________________________________________________   Discussed status with Dr. Marland Kitchenon***at *** and received approval for admission today.  Admission Coordinator:  Stephania Fragmin, time***/Date***

## 2023-11-13 DIAGNOSIS — K5732 Diverticulitis of large intestine without perforation or abscess without bleeding: Secondary | ICD-10-CM | POA: Diagnosis not present

## 2023-11-13 LAB — BASIC METABOLIC PANEL
Anion gap: 12 (ref 5–15)
BUN: 39 mg/dL — ABNORMAL HIGH (ref 8–23)
CO2: 24 mmol/L (ref 22–32)
Calcium: 7.6 mg/dL — ABNORMAL LOW (ref 8.9–10.3)
Chloride: 100 mmol/L (ref 98–111)
Creatinine, Ser: 7.04 mg/dL — ABNORMAL HIGH (ref 0.44–1.00)
GFR, Estimated: 6 mL/min — ABNORMAL LOW (ref >60)
Glucose, Bld: 128 mg/dL — ABNORMAL HIGH (ref 70–99)
Potassium: 4 mmol/L (ref 3.5–5.1)
Sodium: 136 mmol/L (ref 135–145)

## 2023-11-13 LAB — CBC
HCT: 23.9 % — ABNORMAL LOW (ref 36.0–46.0)
Hemoglobin: 7.6 g/dL — ABNORMAL LOW (ref 12.0–15.0)
MCH: 26.7 pg (ref 26.0–34.0)
MCHC: 31.8 g/dL (ref 30.0–36.0)
MCV: 83.9 fL (ref 80.0–100.0)
Platelets: 326 10*3/uL (ref 150–400)
RBC: 2.85 MIL/uL — ABNORMAL LOW (ref 3.87–5.11)
RDW: 16.8 % — ABNORMAL HIGH (ref 11.5–15.5)
WBC: 18.8 10*3/uL — ABNORMAL HIGH (ref 4.0–10.5)
nRBC: 0 % (ref 0.0–0.2)

## 2023-11-13 LAB — PREPARE RBC (CROSSMATCH)

## 2023-11-13 LAB — GLUCOSE, CAPILLARY
Glucose-Capillary: 98 mg/dL (ref 70–99)
Glucose-Capillary: 167 mg/dL — ABNORMAL HIGH (ref 70–99)
Glucose-Capillary: 161 mg/dL — ABNORMAL HIGH (ref 70–99)

## 2023-11-13 LAB — HEPARIN LEVEL (UNFRACTIONATED): Heparin Unfractionated: 0.3 [IU]/mL (ref 0.30–0.70)

## 2023-11-13 MED ORDER — AMLODIPINE BESYLATE 5 MG PO TABS
5.0000 mg | ORAL_TABLET | Freq: Every day | ORAL | Status: DC
  Administered 2023-11-13 – 2023-11-25 (×12): 5 mg via ORAL
  Filled 2023-11-13 (×12): qty 1

## 2023-11-13 MED ORDER — APIXABAN 5 MG PO TABS
5.0000 mg | ORAL_TABLET | Freq: Two times a day (BID) | ORAL | Status: DC
  Administered 2023-11-13 – 2023-11-25 (×24): 5 mg via ORAL
  Filled 2023-11-13 (×23): qty 1

## 2023-11-13 MED ORDER — SODIUM CHLORIDE 0.9% IV SOLUTION: Freq: Once | INTRAVENOUS | Status: AC

## 2023-11-13 MED ORDER — HYDRALAZINE HCL 20 MG/ML IJ SOLN: 10.0000 mg | INTRAMUSCULAR | Status: DC | PRN

## 2023-11-13 MED ORDER — HEPARIN SODIUM (PORCINE) 1000 UNIT/ML IJ SOLN
5000.0000 [IU] | Freq: Once | INTRAMUSCULAR | Status: AC
  Administered 2023-11-13: 5000 [IU]
  Filled 2023-11-13: qty 5

## 2023-11-13 NOTE — Progress Notes (Signed)
Progress Note  11 Days Post-Op  Subjective: Seen in HD today.  No new complaints.  No further bleeding since yesterday.  Appears Eliquis was restarted today already.  Objective: Vital signs in last 24 hours: Temp:  [97.7 F (36.5 C)-98 F (36.7 C)] 98 F (36.7 C) (02/19 0800) Pulse Rate:  [67-81] 81 (02/19 1100) Resp:  [11-20] 20 (02/19 1100) BP: (133-185)/(59-85) 185/78 (02/19 1100) SpO2:  [91 %-100 %] 100 % (02/19 1100) Weight:  [96.1 kg] 96.1 kg (02/19 0818) Last BM Date : 11/12/23  Intake/Output from previous day: 02/18 0701 - 02/19 0700 In: 360 [P.O.:360] Out: 200 [Urine:200] Intake/Output this shift: No intake/output data recorded.  PE: Abd: soft, appropriately ttp, midline wound clean with no bleeding so far today, ostomy functioning with stool in bag   Lab Results:  Recent Labs    11/12/23 0420 11/13/23 0052  WBC 22.0* 18.8*  HGB 8.0* 7.6*  HCT 24.9* 23.9*  PLT 326 326   BMET Recent Labs    11/11/23 0327 11/13/23 0052  NA 137 136  K 3.7 4.0  CL 102 100  CO2 22 24  GLUCOSE 161* 128*  BUN 55* 39*  CREATININE 7.89* 7.04*  CALCIUM 7.5* 7.6*   PT/INR No results for input(s): "LABPROT", "INR" in the last 72 hours. CMP     Component Value Date/Time   NA 136 11/13/2023 0052   NA 137 02/28/2021 1548   K 4.0 11/13/2023 0052   CL 100 11/13/2023 0052   CO2 24 11/13/2023 0052   GLUCOSE 128 (H) 11/13/2023 0052   BUN 39 (H) 11/13/2023 0052   BUN 33 (H) 02/28/2021 1548   CREATININE 7.04 (H) 11/13/2023 0052   CREATININE 3.38 (H) 12/26/2015 1347   CALCIUM 7.6 (L) 11/13/2023 0052   PROT 5.8 (L) 11/11/2023 0327   PROT 7.0 11/07/2017 1414   ALBUMIN 1.7 (L) 11/11/2023 0327   ALBUMIN 4.0 11/07/2017 1414   AST 23 11/11/2023 0327   ALT 17 11/11/2023 0327   ALKPHOS 39 11/11/2023 0327   BILITOT 0.5 11/11/2023 0327   BILITOT 0.2 11/07/2017 1414   GFRNONAA 6 (L) 11/13/2023 0052   GFRNONAA 13 (L) 01/10/2015 1428   GFRAA 21 (L) 12/21/2019 1802   GFRAA 16  (L) 01/10/2015 1428   Lipase     Component Value Date/Time   LIPASE 26 10/27/2023 1936       Studies/Results: IR Fluoro Guide CV Line Right Result Date: 11/12/2023 CLINICAL DATA:  End-stage renal disease and status post placement of non tunneled temporary dialysis catheter on 11/01/2023. The patient now requires placement of a tunneled dialysis catheter for further longer-term hemodialysis. EXAM: TUNNELED CENTRAL VENOUS HEMODIALYSIS CATHETER PLACEMENT WITH ULTRASOUND AND FLUOROSCOPIC GUIDANCE ANESTHESIA/SEDATION: Moderate (conscious) sedation was employed during this procedure. A total of Versed 1.0 mg and Fentanyl 50 mcg was administered intravenously. Moderate Sedation Time: 16 minutes. The patient's level of consciousness and vital signs were monitored continuously by radiology nursing throughout the procedure under my direct supervision. MEDICATIONS: The patient is receiving IV Rocephin every 8 hours in the hospital and did not receive additional antibiotics for the procedure. FLUOROSCOPY: 24 seconds.  2.3 mGy. PROCEDURE: The procedure, risks, benefits, and alternatives were explained to the patient. Questions regarding the procedure were encouraged and answered. The patient understands and consents to the procedure. A timeout was performed prior to initiating the procedure. A non tunneled temporary right-sided dialysis catheter was removed prior to the procedure and pressure held at the exit site. The  right neck and chest were prepped with chlorhexidine in a sterile fashion, and a sterile drape was applied covering the operative field. Maximum barrier sterile technique with sterile gowns and gloves were used for the procedure. Local anesthesia was provided with 1% lidocaine. Ultrasound was used to confirm patency of the right internal jugular vein. An ultrasound image was saved and recorded. After creating a small venotomy incision, a 21 gauge needle was advanced into the right internal jugular  vein under direct, real-time ultrasound guidance. Ultrasound image documentation was performed. After securing guidewire access, an 8 Fr dilator was placed. A J-wire was kinked to measure appropriate catheter length. A Palindrome tunneled hemodialysis catheter measuring 19 cm from tip to cuff was chosen for placement. This was tunneled in a retrograde fashion from the chest wall to the venotomy incision. At the venotomy, serial dilatation was performed and a 15 Fr peel-away sheath was placed over a guidewire. The catheter was then placed through the sheath and the sheath removed. Final catheter positioning was confirmed and documented with a fluoroscopic spot image. The catheter was aspirated, flushed with saline, and injected with appropriate volume heparin dwells. The venotomy incision was closed with subcuticular 4-0 Vicryl. Dermabond was applied to the incision. The catheter exit site was secured with 0-Prolene retention sutures. COMPLICATIONS: None.  No pneumothorax. FINDINGS: After catheter placement, the tip lies in the right atrium. The catheter aspirates normally and is ready for immediate use. IMPRESSION: Placement of tunneled hemodialysis catheter via new puncture of the right internal jugular vein after removal of the non tunneled catheter. The new catheter tip lies in the right atrium. The catheter is ready for immediate use. Electronically Signed   By: Irish Lack M.D.   On: 11/12/2023 10:45   IR US Guide Vasc Access Right Result Date: 11/12/2023 CLINICAL DATA:  End-stage renal disease and status post placement of non tunneled temporary dialysis catheter on 11/01/2023. The patient now requires placement of a tunneled dialysis catheter for further longer-term hemodialysis. EXAM: TUNNELED CENTRAL VENOUS HEMODIALYSIS CATHETER PLACEMENT WITH ULTRASOUND AND FLUOROSCOPIC GUIDANCE ANESTHESIA/SEDATION: Moderate (conscious) sedation was employed during this procedure. A total of Versed 1.0 mg and  Fentanyl 50 mcg was administered intravenously. Moderate Sedation Time: 16 minutes. The patient's level of consciousness and vital signs were monitored continuously by radiology nursing throughout the procedure under my direct supervision. MEDICATIONS: The patient is receiving IV Rocephin every 8 hours in the hospital and did not receive additional antibiotics for the procedure. FLUOROSCOPY: 24 seconds.  2.3 mGy. PROCEDURE: The procedure, risks, benefits, and alternatives were explained to the patient. Questions regarding the procedure were encouraged and answered. The patient understands and consents to the procedure. A timeout was performed prior to initiating the procedure. A non tunneled temporary right-sided dialysis catheter was removed prior to the procedure and pressure held at the exit site. The right neck and chest were prepped with chlorhexidine in a sterile fashion, and a sterile drape was applied covering the operative field. Maximum barrier sterile technique with sterile gowns and gloves were used for the procedure. Local anesthesia was provided with 1% lidocaine. Ultrasound was used to confirm patency of the right internal jugular vein. An ultrasound image was saved and recorded. After creating a small venotomy incision, a 21 gauge needle was advanced into the right internal jugular vein under direct, real-time ultrasound guidance. Ultrasound image documentation was performed. After securing guidewire access, an 8 Fr dilator was placed. A J-wire was kinked to measure appropriate catheter length. A  Palindrome tunneled hemodialysis catheter measuring 19 cm from tip to cuff was chosen for placement. This was tunneled in a retrograde fashion from the chest wall to the venotomy incision. At the venotomy, serial dilatation was performed and a 15 Fr peel-away sheath was placed over a guidewire. The catheter was then placed through the sheath and the sheath removed. Final catheter positioning was confirmed  and documented with a fluoroscopic spot image. The catheter was aspirated, flushed with saline, and injected with appropriate volume heparin dwells. The venotomy incision was closed with subcuticular 4-0 Vicryl. Dermabond was applied to the incision. The catheter exit site was secured with 0-Prolene retention sutures. COMPLICATIONS: None.  No pneumothorax. FINDINGS: After catheter placement, the tip lies in the right atrium. The catheter aspirates normally and is ready for immediate use. IMPRESSION: Placement of tunneled hemodialysis catheter via new puncture of the right internal jugular vein after removal of the non tunneled catheter. The new catheter tip lies in the right atrium. The catheter is ready for immediate use. Electronically Signed   By: Irish Lack M.D.   On: 11/12/2023 10:45    Anti-infectives: Anti-infectives (From admission, onward)    Start     Dose/Rate Route Frequency Ordered Stop   11/08/23 1200  erythromycin (EES) 400 MG/5ML suspension 400 mg  Status:  Discontinued        400 mg Oral Every 6 hours 11/08/23 0815 11/12/23 1111   11/06/23 0815  piperacillin-tazobactam (ZOSYN) IVPB 2.25 g        2.25 g 100 mL/hr over 30 Minutes Intravenous Every 8 hours 11/06/23 0729     11/01/23 1415  piperacillin-tazobactam (ZOSYN) IVPB 2.25 g        2.25 g 100 mL/hr over 30 Minutes Intravenous Every 8 hours 11/01/23 1316 11/06/23 0602   10/29/23 1100  piperacillin-tazobactam (ZOSYN) IVPB 2.25 g  Status:  Discontinued        2.25 g 100 mL/hr over 30 Minutes Intravenous Every 8 hours 10/29/23 1006 11/01/23 1016   10/28/23 0600  piperacillin-tazobactam (ZOSYN) IVPB 2.25 g  Status:  Discontinued        2.25 g 100 mL/hr over 30 Minutes Intravenous Every 8 hours 10/27/23 2224 10/27/23 2235   10/28/23 0600  ceFEPIme (MAXIPIME) 1 g in sodium chloride 0.9 % 100 mL IVPB  Status:  Discontinued        1 g 200 mL/hr over 30 Minutes Intravenous Every 24 hours 10/27/23 2235 10/29/23 0954   10/28/23  0600  metroNIDAZOLE (FLAGYL) IVPB 500 mg  Status:  Discontinued        500 mg 100 mL/hr over 60 Minutes Intravenous Every 12 hours 10/27/23 2235 10/29/23 0954   10/27/23 2130  piperacillin-tazobactam (ZOSYN) IVPB 3.375 g        3.375 g 100 mL/hr over 30 Minutes Intravenous  Once 10/27/23 2128 10/27/23 2320        Assessment/Plan  POD 11, s/p Hartmann's by Dr. Freida Busman 11/02/23 forSigmoid diverticulitis with perforation  - WBC down to 18K -hgb 7.6 today.  Primary team restarted Eliquis today - renal diet. No nausea recently. Discussed with [facility] and pharm and can dc erythromycin (for GI motility) - therapies for mobilization. - VAC to midline wound, M/Th sched. Will resume VAC tomorrow to continue this schedule if remains nonbloody - WOC for colostomy care     FEN: renal diet, TNA stopped VTE: Eliquis ID: cefepime/flagyl 2/3>2/4; Zosyn 2/4>>   - per [facility] -  PAF  HTN HLD AKI  on CKD stage V - on HD CAD T2DM   LOS: 17 days     Letha Cape, [facility] 11/13/2023, 11:24 AM Please see Amion for pager number during day hours 7:00am-4:30pm

## 2023-11-13 NOTE — Progress Notes (Signed)
Initial Nutrition Assessment  DOCUMENTATION CODES:   Obesity unspecified  INTERVENTION:  Hand out provided Continue with current diet. Continue with MVI Ensure Plus High Protein po BID, each supplement provides 350 kcal and 20 grams of protein.    NUTRITION DIAGNOSIS:   Increased nutrient needs related to post-op healing as evidenced by estimated needs.    GOAL:   Patient will meet greater than or equal to 90% of their needs    MONITOR:   Diet advancement  REASON FOR ASSESSMENT:   Consult Diet education  ASSESSMENT:   Pt with hx of CKD 5 following with [facility] for evaluation for renal transplant, atrial fibrillation on anticoagulation, CAD, NSTEMI, PAD, CVA, diabetes type 2, hypertension, hyperlipidemia, anemia, who presented for acute onset left lower quadrant abdominal pain. She has a history of partial right colectomy for polyps reported noncancerous, remote history of partial hysterectomy. Found to have complicated diverticulitis with contained perforation.  50% intake x 1 meal.  TPN support discontinued on Monday. Consulted for HD education. Patient not available at time of visit.  Hand outs left in room.  NGT for suctioning removed.  CIR pending.   NUTRITION - FOCUSED PHYSICAL EXAM:    Diet Order:   Diet Order             Diet renal with fluid restriction Fluid restriction: 1200 mL Fluid; Room service appropriate? Yes; Fluid consistency: Thin  Diet effective now                   EDUCATION NEEDS:   No education needs have been identified at this time  Skin:  Skin Assessment: Skin Integrity Issues: Skin Integrity Issues:: Wound VAC Wound Vac: abdomen  Last BM:  11/12/23  Height:   Ht Readings from Last 1 Encounters:  11/02/23 5' 7.01" (1.702 m)    Weight:   Wt Readings from Last 1 Encounters:  11/13/23 96.1 kg    Ideal Body Weight:  61.4 kg  BMI:  Body mass index is 33.17 kg/m.  Estimated Nutritional Needs:   Kcal:   2250-2450  Protein:  120-135 grams  Fluid:  1000 ml + UOP    Jamelle Haring RDN, LDN Clinical Dietitian   If unable to reach, please contact "[facility] Inpatient" secure chat group between 8 am-4 pm daily"

## 2023-11-13 NOTE — Progress Notes (Signed)
PROGRESS NOTE        PATIENT DETAILS Name: [name] Age: 68 y.o. Sex: female Date of Birth: 1956-01-20 Admit Date: 10/27/2023 Admitting Physician Dolly Rias, MD ZOX:[id number], Devonne Doughty, NP  Brief Summary: Patient is a 68 y.o.  female with history of CKD 5, A-fib on Eliquis, CAD-who presented with abdominal pain-found to have acute diverticulitis with contained perforation involving the descending colon.  Hospital course complicated by worsening AKI-thought to have progressed to ESRD-requiring initiation of HD.  See below for further details.  Significant events: 2/2>> admit to [facility] 2/6>> CT abdomen worsening diverticulitis with perforation 2/7>> transitioned to full comfort measures-as with worsening AKI-however multiple family members arrived-comfort care revoked-now full code-full scope of treatment including HD/laparotomy with colostomy.  General surgery/nephrology reconsulted.  IR placed [facility] and patient underwent first hemodialysis.  2/8>> remains n.p.o-colectomy/colostomy later today.  Significant studies: 2/2>> CT abdomen/pelvis: Acute diverticulitis-descending colon with contained perforation 2/6>> CT abdomen/pelvis: Worsening diverticulitis of descending colon with perforation and increasing failure-suspected oral contrast extravasation left abdomen.  Significant microbiology data: 2/2>> COVID/influenza/RSV PCR: Negative  Procedures: 2/8>> by Dr. Freida Busman underwent exploratory laparotomy with Hartman's resection for perforated diverticulitis with diffuse peritonitis, colostomy in place. 2/13 - R IJ Trialysis catheter - IR 2/14 -left IJ Central line placed by IR for TNA 11/12/2023. R.internal jugular tunneled HD catheter placement by IR  Consults: Surgery Renal Palliative IR  Subjective:  No nausea, no vomiting, she tolerated her HD well this morning, she is currently having lunch, reports good appetite.     Patient in bed, appears  comfortable, denies any headache, no fever, no chest pain or pressure, no shortness of breath , no abdominal pain. No new focal weakness.    Objective: Vitals: Blood pressure (!) 166/70, pulse 80, temperature 98 F (36.7 C), resp. rate 20, height 5' 7.01" (1.702 m), weight 96.1 kg, SpO2 100%.   Exam:  Awake Alert, Oriented X 3, No new F.N deficits, Normal affect Symmetrical Chest wall movement, Good air movement bilaterally, CTAB RRR,No Gallops,Rubs or new Murmurs, No Parasternal Heave +ve B.Sounds, Abd Soft, Surgical abdominal wound covered with bandage, ostomy bag present with dark stool, . No Cyanosis, Clubbing or edema, No new Rash or bruise    Right upper chest tunneled HD catheter, left upper chest CVC line.   Assessment/Plan:  Acute diverticulitis with contained perforation of the descending colon  -Management Per general surgery, status post Hartman's procedure by Dr. Freida Busman 11/02/2023. -Currently off TPN, appetite has improved, tolerating p.o. intake, DC IV erythromycin . -Wound care per general surgery, plan for wound VAC tomorrow if remains nonbloody  -Wound care following for colostomy care   AKI on CKD 5 with progression to ESRD AKI likely hemodynamically mediated-has now likely progressed to ESRD.  HD initiated via right IJ temporary catheter, switched to right IJ trialysis catheter by IR on 11/07/2023>> tunneled HD catheter right IJ on 11/12/2023 by IR.  CAD Currently no anginal symptoms Suspect not on antiplatelets as on anticoagulation with Eliquis/heparin Continue beta-blocker On Repatha injections as an outpatient.  PAF Sinus rhythm Continue Coreg Currently on Eliquis  Anemia of acute illness  Anemia of end-stage renal disease Brought on by acute illness, underlying renal insufficiency now developing to ESRD, surgical blood loss, gradually trending down, 2 units of packed RBC on 11/04/2023, on IV PPI.  -Hemoglobin down to 7.6 today,  given she is on Eliquis,  with open wound, will transfuse 1 unit PRBC. -IV iron and Procrit per renal  HTN Started to increase, continue with home dose Coreg, will start on amlodipine uptitrate dose as needed, meanwhile continue with hydralazine as needed.  DM-2 (A1c 6.6 on 2/3) Of TNA now tolerating oral diet, Semglee and sliding scale adjusted.  Recent Labs    11/12/23 1657 11/12/23 2138 11/13/23 1240  GLUCAP 116* 138* 98       Palliative care/goals of care Palliative input greatly appreciated Class 1 Obesity: Estimated body mass index is 33.17 kg/m as calculated from the following:   Height as of this encounter: 5' 7.01" (1.702 m).   Weight as of this encounter: 96.1 kg.    Code status:   Code Status: Limited: Do not attempt resuscitation (DNR) -DNR-LIMITED -Do Not Intubate/DNI    DVT Prophylaxis: Place and maintain sequential compression device Start: 11/06/23 0613 apixaban (ELIQUIS) tablet 5 mg IV Heparin  Family Communication: Discussed with patient, all her questions answered.  Disposition Plan: Status is: Inpatient Remains inpatient appropriate because: Severity of illness   Planned Discharge Destination:Residential hospice   Diet: Diet Order             Diet regular Room service appropriate? Yes; Fluid consistency: Thin  Diet effective now                   MEDICATIONS: Scheduled Meds:  apixaban  5 mg Oral BID   carvedilol  6.25 mg Oral BID WC   Chlorhexidine Gluconate Cloth  6 each Topical Q0600   darbepoetin (ARANESP) injection - DIALYSIS  60 mcg Subcutaneous Q Mon-1800   insulin aspart  0-15 Units Subcutaneous TID WC   insulin aspart  0-5 Units Subcutaneous QHS   insulin glargine-yfgn  12 Units Subcutaneous Daily   mouth rinse  15 mL Mouth Rinse 4 times per day   pantoprazole (PROTONIX) IV  40 mg Intravenous Q12H   sodium chloride flush  10-40 mL Intracatheter Q12H   Continuous Infusions:  piperacillin-tazobactam (ZOSYN)  IV 2.25 g (11/13/23 0610)   PRN  Meds:.acetaminophen **OR** acetaminophen, albuterol, hydrALAZINE, HYDROmorphone (DILAUDID) injection, menthol-cetylpyridinium, ondansetron (ZOFRAN) IV, mouth rinse, oxyCODONE   I have personally reviewed following labs and imaging studies  LABORATORY DATA:   Data Review:    Recent Labs  Lab 11/07/23 0319 11/08/23 0620 11/09/23 0958 11/10/23 0139 11/11/23 0327 11/12/23 0420 11/13/23 0052  WBC 31.3* 27.8* 24.7* 21.5* 22.5* 22.0* 18.8*  HGB 9.3* 9.2* 8.7* 7.9* 8.0* 8.0* 7.6*  HCT 28.3* 28.2* 26.8* 24.5* 24.8* 24.9* 23.9*  PLT 320 315 300 300 331 326 326  MCV 80.9 80.8 81.7 82.8 83.2 83.0 83.9  MCH 26.6 26.4 26.5 26.7 26.8 26.7 26.7  MCHC 32.9 32.6 32.5 32.2 32.3 32.1 31.8  RDW 16.0* 15.9* 16.0* 16.1* 16.7* 16.7* 16.8*  LYMPHSABS 0.9 0.8 1.5 1.5 0.5*  --   --   MONOABS 1.9* 2.0* 0.7 3.9* 2.5*  --   --   EOSABS 0.3 0.4 0.5 0.2 0.7*  --   --   BASOSABS 0.0 0.2* 0.0 0.0 0.0  --   --     Recent Labs  Lab 11/07/23 0319 11/07/23 1431 11/08/23 0620 11/09/23 0958 11/10/23 0139 11/11/23 0327 11/13/23 0052  NA 136 133* 134* 133* 137 137 136  K 3.9 4.1 3.1* 3.4* 3.4* 3.7 4.0  CL 96* 97* 95* 95* 99 102 100  CO2 20* 18* 22 25 24 22  24  ANIONGAP 20* 18* 17* 13 14 13 12   GLUCOSE 200* 216* 344* 358* 122* 161* 128*  BUN 38* 45* 55* 38* 45* 55* 39*  CREATININE 5.48* 6.34* 7.14* 5.43* 6.44* 7.89* 7.04*  AST  --  16  --   --   --  23  --   ALT  --  14  --   --   --  17  --   ALKPHOS  --  47  --   --   --  39  --   BILITOT  --  1.6*  --   --   --  0.5  --   ALBUMIN  --  2.1*  --   --   --  1.7*  --   CRP 17.8*  --  16.2* 13.7* 12.1*  --   --   PROCALCITON 19.62  --  17.70 16.20 15.86 12.67  --   MG 2.1  --  2.1 1.8 1.9 1.8  --   PHOS  --  5.5* 4.6 2.6 2.3* 2.2*  --   CALCIUM 7.6* 7.6* 7.4* 7.3* 7.3* 7.5* 7.6*      Recent Labs  Lab 11/07/23 0319 11/07/23 1431 11/08/23 0620 11/09/23 0958 11/10/23 0139 11/11/23 0327 11/13/23 0052  CRP 17.8*  --  16.2* 13.7* 12.1*  --   --    PROCALCITON 19.62  --  17.70 16.20 15.86 12.67  --   MG 2.1  --  2.1 1.8 1.9 1.8  --   CALCIUM 7.6*   < > 7.4* 7.3* 7.3* 7.5* 7.6*   < > = values in this interval not displayed.    --------------------------------------------------------------------------------------------------------------- Lab Results  Component Value Date   CHOL 134 12/21/2019   HDL 49 12/21/2019   LDLCALC 69 12/21/2019   TRIG 148 11/11/2023   CHOLHDL 2.7 12/21/2019    Lab Results  Component Value Date   HGBA1C 6.6 (H) 10/28/2023      Micro Results Recent Results (from the past 240 hours)  MRSA Next Gen by PCR, Nasal     Status: None   Collection Time: 11/10/23  5:14 AM   Specimen: Nasal Mucosa; Nasal Swab  Result Value Ref Range Status   MRSA by PCR Next Gen NOT DETECTED NOT DETECTED Final    Comment: (NOTE) The GeneXpert MRSA Assay (FDA approved for NASAL specimens only), is one component of a comprehensive MRSA colonization surveillance program. It is not intended to diagnose MRSA infection nor to guide or monitor treatment for MRSA infections. Test performance is not FDA approved in patients less than 70 years old. Performed at [facility], [address], [place], [place] [address]      Radiology Reports IR Fluoro Guide CV Line Right Result Date: 11/12/2023 CLINICAL DATA:  End-stage renal disease and status post placement of non tunneled temporary dialysis catheter on 11/01/2023. The patient now requires placement of a tunneled dialysis catheter for further longer-term hemodialysis. EXAM: TUNNELED CENTRAL VENOUS HEMODIALYSIS CATHETER PLACEMENT WITH ULTRASOUND AND FLUOROSCOPIC GUIDANCE ANESTHESIA/SEDATION: Moderate (conscious) sedation was employed during this procedure. A total of Versed 1.0 mg and Fentanyl 50 mcg was administered intravenously. Moderate Sedation Time: 16 minutes. The patient's level of consciousness and vital signs were monitored continuously by radiology nursing  throughout the procedure under my direct supervision. MEDICATIONS: The patient is receiving IV Rocephin every 8 hours in the hospital and did not receive additional antibiotics for the procedure. FLUOROSCOPY: 24 seconds.  2.3 mGy. PROCEDURE: The procedure, risks,  benefits, and alternatives were explained to the patient. Questions regarding the procedure were encouraged and answered. The patient understands and consents to the procedure. A timeout was performed prior to initiating the procedure. A non tunneled temporary right-sided dialysis catheter was removed prior to the procedure and pressure held at the exit site. The right neck and chest were prepped with chlorhexidine in a sterile fashion, and a sterile drape was applied covering the operative field. Maximum barrier sterile technique with sterile gowns and gloves were used for the procedure. Local anesthesia was provided with 1% lidocaine. Ultrasound was used to confirm patency of the right internal jugular vein. An ultrasound image was saved and recorded. After creating a small venotomy incision, a 21 gauge needle was advanced into the right internal jugular vein under direct, real-time ultrasound guidance. Ultrasound image documentation was performed. After securing guidewire access, an 8 Fr dilator was placed. A J-wire was kinked to measure appropriate catheter length. A Palindrome tunneled hemodialysis catheter measuring 19 cm from tip to cuff was chosen for placement. This was tunneled in a retrograde fashion from the chest wall to the venotomy incision. At the venotomy, serial dilatation was performed and a 15 Fr peel-away sheath was placed over a guidewire. The catheter was then placed through the sheath and the sheath removed. Final catheter positioning was confirmed and documented with a fluoroscopic spot image. The catheter was aspirated, flushed with saline, and injected with appropriate volume heparin dwells. The venotomy incision was closed with  subcuticular 4-0 Vicryl. Dermabond was applied to the incision. The catheter exit site was secured with 0-Prolene retention sutures. COMPLICATIONS: None.  No pneumothorax. FINDINGS: After catheter placement, the tip lies in the right atrium. The catheter aspirates normally and is ready for immediate use. IMPRESSION: Placement of tunneled hemodialysis catheter via new puncture of the right internal jugular vein after removal of the non tunneled catheter. The new catheter tip lies in the right atrium. The catheter is ready for immediate use. Electronically Signed   By: Irish Lack M.D.   On: 11/12/2023 10:45   IR US Guide Vasc Access Right Result Date: 11/12/2023 CLINICAL DATA:  End-stage renal disease and status post placement of non tunneled temporary dialysis catheter on 11/01/2023. The patient now requires placement of a tunneled dialysis catheter for further longer-term hemodialysis. EXAM: TUNNELED CENTRAL VENOUS HEMODIALYSIS CATHETER PLACEMENT WITH ULTRASOUND AND FLUOROSCOPIC GUIDANCE ANESTHESIA/SEDATION: Moderate (conscious) sedation was employed during this procedure. A total of Versed 1.0 mg and Fentanyl 50 mcg was administered intravenously. Moderate Sedation Time: 16 minutes. The patient's level of consciousness and vital signs were monitored continuously by radiology nursing throughout the procedure under my direct supervision. MEDICATIONS: The patient is receiving IV Rocephin every 8 hours in the hospital and did not receive additional antibiotics for the procedure. FLUOROSCOPY: 24 seconds.  2.3 mGy. PROCEDURE: The procedure, risks, benefits, and alternatives were explained to the patient. Questions regarding the procedure were encouraged and answered. The patient understands and consents to the procedure. A timeout was performed prior to initiating the procedure. A non tunneled temporary right-sided dialysis catheter was removed prior to the procedure and pressure held at the exit site. The right  neck and chest were prepped with chlorhexidine in a sterile fashion, and a sterile drape was applied covering the operative field. Maximum barrier sterile technique with sterile gowns and gloves were used for the procedure. Local anesthesia was provided with 1% lidocaine. Ultrasound was used to confirm patency of the right internal jugular vein. An  ultrasound image was saved and recorded. After creating a small venotomy incision, a 21 gauge needle was advanced into the right internal jugular vein under direct, real-time ultrasound guidance. Ultrasound image documentation was performed. After securing guidewire access, an 8 Fr dilator was placed. A J-wire was kinked to measure appropriate catheter length. A Palindrome tunneled hemodialysis catheter measuring 19 cm from tip to cuff was chosen for placement. This was tunneled in a retrograde fashion from the chest wall to the venotomy incision. At the venotomy, serial dilatation was performed and a 15 Fr peel-away sheath was placed over a guidewire. The catheter was then placed through the sheath and the sheath removed. Final catheter positioning was confirmed and documented with a fluoroscopic spot image. The catheter was aspirated, flushed with saline, and injected with appropriate volume heparin dwells. The venotomy incision was closed with subcuticular 4-0 Vicryl. Dermabond was applied to the incision. The catheter exit site was secured with 0-Prolene retention sutures. COMPLICATIONS: None.  No pneumothorax. FINDINGS: After catheter placement, the tip lies in the right atrium. The catheter aspirates normally and is ready for immediate use. IMPRESSION: Placement of tunneled hemodialysis catheter via new puncture of the right internal jugular vein after removal of the non tunneled catheter. The new catheter tip lies in the right atrium. The catheter is ready for immediate use. Electronically Signed   By: Irish Lack M.D.   On: 11/12/2023 10:45   IR US Guide  Vasc Access Left Result Date: 11/08/2023 INDICATION: ESRD.  IV antibiotics. EXAM: ULTRASOUND AND FLUOROSCOPIC GUIDED PLACEMENT OF TUNNELED CENTRAL VENOUS "Powerline" CATHETER MEDICATIONS: The patient was on scheduled IV antibiotics. ANESTHESIA/SEDATION: Local anesthetic was administered. The patient was continuously monitored during the procedure by the interventional radiology nurse under my direct supervision. FLUOROSCOPY TIME:  Fluoroscopic dose; 3.8 mGy COMPLICATIONS: None immediate. PROCEDURE: Informed written consent was obtained from the patient and/or patient's representative after a discussion of the risks, benefits, and alternatives to treatment. Questions regarding the procedure were encouraged and answered. The LEFT neck and chest were prepped with chlorhexidine in a sterile fashion, and a sterile drape was applied covering the operative field. Maximum barrier sterile technique with sterile gowns and gloves were used for the procedure. A timeout was performed prior to the initiation of the procedure. After the overlying soft tissues were anesthetized, a small venotomy incision was created and a micropuncture kit was utilized to access the internal jugular vein. Real-time ultrasound guidance was utilized for vascular access including the acquisition of a permanent ultrasound image documenting patency of the accessed vessel. The microwire was utilized to measure appropriate catheter length. The micropuncture sheath was exchanged for a peel-away sheath over a guidewire. A 5 Fr dual lumen tunneled central venous catheter measuring 33 cm was tunneled in a retrograde fashion from the anterior chest wall to the venotomy incision. The catheter was then placed through the peel-away sheath with tip ultimately positioned at the superior caval-atrial junction. Final catheter positioning was confirmed and documented with a spot radiographic image. The catheter aspirates and flushes normally. The catheter was flushed  with appropriate volume heparin dwells. The catheter exit site was secured with a 3-0 Ethilon retention suture. The venotomy incision was closed with Dermabond. Dressings were applied. The patient tolerated the procedure well without immediate post procedural complication. FINDINGS: After catheter placement, the tip lies within the superior apect of the right atrium. The catheter aspirates and flushes normally and is ready for immediate use. IMPRESSION: Successful placement of 33 cm dual lumen  tunneled "Powerline" central venous catheter via the LEFT internal jugular vein The tip of the catheter is positioned within the proximal RIGHT atrium. The catheter is ready for immediate use. Roanna Banning, MD [facility] Radiology Electronically Signed   By: Roanna Banning M.D.   On: 11/08/2023 18:10   IR Fluoro Guide CV Line Left Result Date: 11/08/2023 INDICATION: ESRD.  IV antibiotics. EXAM: ULTRASOUND AND FLUOROSCOPIC GUIDED PLACEMENT OF TUNNELED CENTRAL VENOUS "Powerline" CATHETER MEDICATIONS: The patient was on scheduled IV antibiotics. ANESTHESIA/SEDATION: Local anesthetic was administered. The patient was continuously monitored during the procedure by the interventional radiology nurse under my direct supervision. FLUOROSCOPY TIME:  Fluoroscopic dose; 3.8 mGy COMPLICATIONS: None immediate. PROCEDURE: Informed written consent was obtained from the patient and/or patient's representative after a discussion of the risks, benefits, and alternatives to treatment. Questions regarding the procedure were encouraged and answered. The LEFT neck and chest were prepped with chlorhexidine in a sterile fashion, and a sterile drape was applied covering the operative field. Maximum barrier sterile technique with sterile gowns and gloves were used for the procedure. A timeout was performed prior to the initiation of the procedure. After the overlying soft tissues were anesthetized, a  small venotomy incision was created and a micropuncture kit was utilized to access the internal jugular vein. Real-time ultrasound guidance was utilized for vascular access including the acquisition of a permanent ultrasound image documenting patency of the accessed vessel. The microwire was utilized to measure appropriate catheter length. The micropuncture sheath was exchanged for a peel-away sheath over a guidewire. A 5 Fr dual lumen tunneled central venous catheter measuring 33 cm was tunneled in a retrograde fashion from the anterior chest wall to the venotomy incision. The catheter was then placed through the peel-away sheath with tip ultimately positioned at the superior caval-atrial junction. Final catheter positioning was confirmed and documented with a spot radiographic image. The catheter aspirates and flushes normally. The catheter was flushed with appropriate volume heparin dwells. The catheter exit site was secured with a 3-0 Ethilon retention suture. The venotomy incision was closed with Dermabond. Dressings were applied. The patient tolerated the procedure well without immediate post procedural complication. FINDINGS: After catheter placement, the tip lies within the superior apect of the right atrium. The catheter aspirates and flushes normally and is ready for immediate use. IMPRESSION: Successful placement of 33 cm dual lumen tunneled "Powerline" central venous catheter via the LEFT internal jugular vein The tip of the catheter is positioned within the proximal RIGHT atrium. The catheter is ready for immediate use. Roanna Banning, MD [facility] Radiology Electronically Signed   By: Roanna Banning M.D.   On: 11/08/2023 18:10   VAS [place] LOWER EXTREMITY VENOUS (DVT) Result Date: 11/07/2023  Lower Venous DVT Study Patient Name:  TERISHA LOSASSO [facility]  Date of Exam:   11/07/2023 Medical Rec #: [id number]          Accession #:    [id number] Date of Birth: 12-21-1955           Patient Gender: F Patient Age:   33 years Exam Location:  [facility] Procedure:      VAS [place] LOWER EXTREMITY VENOUS (DVT) Referring Phys: Tresa Endo OSBORNE --------------------------------------------------------------------------------  Indications: Pain, stroke, and A-fib, anemia.  Comparison Study: No prior exam. Performing Technologist: Fernande Bras  Examination Guidelines: A complete evaluation includes B-mode imaging, spectral Doppler, color Doppler, and power Doppler as needed of all accessible portions of each  vessel. Bilateral testing is considered an integral part of a complete examination. Limited examinations for reoccurring indications may be performed as noted. The reflux portion of the exam is performed with the patient in reverse Trendelenburg.  +---------+---------------+---------+-----------+----------+--------------+ RIGHT    CompressibilityPhasicitySpontaneityPropertiesThrombus Aging +---------+---------------+---------+-----------+----------+--------------+ CFV      Full           Yes      Yes                                 +---------+---------------+---------+-----------+----------+--------------+ SFJ      Full           Yes      Yes                                 +---------+---------------+---------+-----------+----------+--------------+ FV Prox  Full                                                        +---------+---------------+---------+-----------+----------+--------------+ FV Mid   Full                                                        +---------+---------------+---------+-----------+----------+--------------+ FV DistalFull                                                        +---------+---------------+---------+-----------+----------+--------------+ PFV      Full                                                        +---------+---------------+---------+-----------+----------+--------------+ POP      Full            Yes      Yes                                 +---------+---------------+---------+-----------+----------+--------------+ PTV      Full                                                        +---------+---------------+---------+-----------+----------+--------------+ PERO     Full                                                        +---------+---------------+---------+-----------+----------+--------------+   +---------+---------------+---------+-----------+----------+--------------+ LEFT     CompressibilityPhasicitySpontaneityPropertiesThrombus Aging +---------+---------------+---------+-----------+----------+--------------+ CFV  Full           Yes      Yes                                 +---------+---------------+---------+-----------+----------+--------------+ SFJ      Full           Yes      Yes                                 +---------+---------------+---------+-----------+----------+--------------+ FV Prox  Full                                                        +---------+---------------+---------+-----------+----------+--------------+ FV Mid   Full                                                        +---------+---------------+---------+-----------+----------+--------------+ FV DistalFull                                                        +---------+---------------+---------+-----------+----------+--------------+ PFV      Full                                                        +---------+---------------+---------+-----------+----------+--------------+ POP      Full           Yes      Yes                                 +---------+---------------+---------+-----------+----------+--------------+ PTV      Full                                                        +---------+---------------+---------+-----------+----------+--------------+ PERO     Full                                                         +---------+---------------+---------+-----------+----------+--------------+     Summary: BILATERAL: - No evidence of deep vein thrombosis seen in the lower extremities, bilaterally. -No evidence of popliteal cyst, bilaterally.   *See table(s) above for measurements and observations. Electronically signed by Heath Lark on 11/07/2023 at 4:05:14 PM.    Final    CT ABDOMEN PELVIS WO CONTRAST Result Date: 11/06/2023 CLINICAL DATA:  Diverticulitis, complication suspected EXAM: CT  ABDOMEN AND PELVIS WITHOUT CONTRAST TECHNIQUE: Multidetector CT imaging of the abdomen and pelvis was performed following the standard protocol without IV contrast. RADIATION DOSE REDUCTION: This exam was performed according to the departmental dose-optimization program which includes automated exposure control, adjustment of the mA and/or kV according to patient size and/or use of iterative reconstruction technique. COMPARISON:  10/31/2023 FINDINGS: Lower chest: Volume loss and opacity in the lower lungs consistent with atelectasis. There is a central line with tip at the upper right atrium. Coronary atherosclerosis. Hepatobiliary: No focal liver abnormality. Calcific in the intermediate density in the gallbladder attributed to stones. No biliary dilatation or pericholecystic edema. Pancreas: Unremarkable. Spleen: Unremarkable. Adrenals/Urinary Tract: Negative adrenals. No hydronephrosis or stone. Renal cortical thinning and lobulation at the left upper pole. 3 mm stone at the upper pole left kidney. The urinary bladder is collapsed around a Foley catheter. Stomach/Bowel: Descending colostomy. Bowel anastomosis in the right abdomen. No evidence of bowel obstruction. Fluid collection with loculation appearance/peritoneal thickening along the left flank measuring up to 5 x 6 x 10 cm. Milder fluid accumulation in the ventral peritoneal space. Vascular/Lymphatic: Extensive atheromatous calcification of the aorta and iliacs. No mass or  adenopathy. Reproductive:No pathologic findings. Other: No ascites or pneumoperitoneum. Musculoskeletal: No acute abnormalities.  T9 butterfly vertebra IMPRESSION: 1. Interval colostomy with fluid collection along the left gutter, dominant pocket measuring 10 x 6 x 5 cm. 2. No bowel obstruction. 3. Cholelithiasis and left nephrolithiasis. Electronically Signed   By: Tiburcio Pea M.D.   On: 11/06/2023 07:12   DG Chest Port 1 View Result Date: 11/03/2023 CLINICAL DATA:  Shortness of breath EXAM: PORTABLE CHEST - 1 VIEW COMPARISON:  11/11/2018 FINDINGS: Unchanged mild cardiomegaly and pulmonary vascular congestion. Interval worsening of left basilar opacity likely due to atelectasis. Lungs otherwise clear. Examination is limited due to expiratory phase of imaging. Interval placement of temporary right IJ hemodialysis catheter which terminates in the region of the right atrium. Nasogastric tube terminates in the left upper quadrant. IMPRESSION: Interval worsening of left basilar opacity which is likely due to atelectasis. Electronically Signed   By: Acquanetta Belling M.D.   On: 11/03/2023 07:22   IR Fluoro Guide CV Line Right Result Date: 11/01/2023 INDICATION: ESRD requiring HD. EXAM: NON-TUNNELED CENTRAL VENOUS HEMODIALYSIS CATHETER PLACEMENT WITH ULTRASOUND AND FLUOROSCOPIC GUIDANCE COMPARISON:  CT AP 10/31/2023. MEDICATIONS: Local anesthetic was administered. FLUOROSCOPY TIME:  Fluoroscopic dose; 35 mGy COMPLICATIONS: None immediate. PROCEDURE: Informed written consent was obtained from the patient and/or patient's representative after a discussion of the risks, benefits, and alternatives to treatment. Questions regarding the procedure were encouraged and answered. The RIGHT neck and chest were prepped with chlorhexidine in a sterile fashion, and a sterile drape was applied covering the operative field. Maximum barrier sterile technique with sterile gowns and gloves were used for the procedure. A timeout was  performed prior to the initiation of the procedure. After the overlying soft tissues were anesthetized, a small venotomy incision was created and a micropuncture kit was utilized to access the external jugular vein. Real-time ultrasound guidance was utilized for vascular access including the acquisition of a permanent ultrasound image documenting patency of the accessed vessel. The microwire was utilized to measure appropriate catheter length. A stiff glidewire was advanced to the level of the IVC. Under fluoroscopic guidance, the venotomy was serially dilated, ultimately allowing placement of a 20 cm temporary Trialysis catheter with tip ultimately terminating within the superior aspect of the right atrium. Final catheter positioning was confirmed  and documented with a spot radiographic image. The catheter aspirates and flushes normally. The catheter was flushed with appropriate volume heparin dwells. The catheter exit site was secured with a 2-0 Ethilon retention suture. A dressing was placed. The patient tolerated the procedure well without immediate post procedural complication. IMPRESSION: Successful placement of a RIGHT EXTERNAL jugular approach 20 cm non-tunneled dialysis catheter The tip of the catheter is positioned within the proximal RIGHT atrium. The catheter is ready for immediate use. PLAN: This catheter may be converted to a tunneled dialysis catheter at a later date as indicated. Roanna Banning, MD [facility] Electronically Signed   By: Roanna Banning M.D.   On: 11/01/2023 18:15   IR US Guide Vasc Access Right Result Date: 11/01/2023 INDICATION: ESRD requiring HD. EXAM: NON-TUNNELED CENTRAL VENOUS HEMODIALYSIS CATHETER PLACEMENT WITH ULTRASOUND AND FLUOROSCOPIC GUIDANCE COMPARISON:  CT AP 10/31/2023. MEDICATIONS: Local anesthetic was administered. FLUOROSCOPY TIME:  Fluoroscopic dose; 35 mGy COMPLICATIONS: None immediate. PROCEDURE: Informed  written consent was obtained from the patient and/or patient's representative after a discussion of the risks, benefits, and alternatives to treatment. Questions regarding the procedure were encouraged and answered. The RIGHT neck and chest were prepped with chlorhexidine in a sterile fashion, and a sterile drape was applied covering the operative field. Maximum barrier sterile technique with sterile gowns and gloves were used for the procedure. A timeout was performed prior to the initiation of the procedure. After the overlying soft tissues were anesthetized, a small venotomy incision was created and a micropuncture kit was utilized to access the external jugular vein. Real-time ultrasound guidance was utilized for vascular access including the acquisition of a permanent ultrasound image documenting patency of the accessed vessel. The microwire was utilized to measure appropriate catheter length. A stiff glidewire was advanced to the level of the IVC. Under fluoroscopic guidance, the venotomy was serially dilated, ultimately allowing placement of a 20 cm temporary Trialysis catheter with tip ultimately terminating within the superior aspect of the right atrium. Final catheter positioning was confirmed and documented with a spot radiographic image. The catheter aspirates and flushes normally. The catheter was flushed with appropriate volume heparin dwells. The catheter exit site was secured with a 2-0 Ethilon retention suture. A dressing was placed. The patient tolerated the procedure well without immediate post procedural complication. IMPRESSION: Successful placement of a RIGHT EXTERNAL jugular approach 20 cm non-tunneled dialysis catheter The tip of the catheter is positioned within the proximal RIGHT atrium. The catheter is ready for immediate use. PLAN: This catheter may be converted to a tunneled dialysis catheter at a later date as indicated. Roanna Banning, MD [facility]  [facility] Radiology Electronically Signed   By: Roanna Banning M.D.   On: 11/01/2023 18:15   CT ABDOMEN PELVIS WO CONTRAST Result Date: 10/31/2023 CLINICAL DATA:  Diverticulitis, complication suspected. EXAM: CT ABDOMEN AND PELVIS WITHOUT CONTRAST TECHNIQUE: Multidetector CT imaging of the abdomen and pelvis was performed following the standard protocol without IV contrast. RADIATION DOSE REDUCTION: This exam was performed according to the departmental dose-optimization program which includes automated exposure control, adjustment of the mA and/or kV according to patient size and/or use of iterative reconstruction technique. COMPARISON:  10/27/2023. FINDINGS: Lower chest: Heart is enlarged and multi-vessel coronary artery calcifications are noted. Consolidation and patchy airspace disease is present at the lung bases bilaterally. Hepatobiliary: No focal liver abnormality is seen. Stones are present within the gallbladder. No biliary ductal dilatation. Pancreas: Unremarkable. No pancreatic  ductal dilatation or surrounding inflammatory changes. Spleen: Normal in size without focal abnormality. Adrenals/Urinary Tract: The adrenal glands are within normal limits. Calcifications are present in the upper pole of the left kidney. There is a cyst in the lower pole of the right kidney. No hydronephrosis bilaterally. The bladder is unremarkable. Stomach/Bowel: The stomach is within normal limits. No bowel obstruction or pneumatosis is seen. Right hemicolectomy changes are noted. There is diffuse colonic wall thickening. Scattered diverticula are present along the colon. A few small foci of free air are noted in the mesentery in the mid left abdomen, slightly increased from the previous exam. There is suggestion of contrast extravasation in the region. There is fine no definite abscess is seen. Vascular/Lymphatic: Aortic atherosclerosis. No enlarged abdominal or pelvic lymph nodes. Reproductive: No acute abnormality. Other:  Mild ascites is noted in all 4 quadrants. A fat containing umbilical hernia is noted. There is mild anasarca. Musculoskeletal: The bony structures are stable. IMPRESSION: 1. Worsening diverticulitis of the descending colon with perforation and increasing free air and suspected oral contrast extravasation in the mid left abdomen. No definite abscess is seen. Surgical consultation is recommended. 2. Mild ascites. 3. Diffuse colonic wall thickening which may be due to associated inflammatory changes. 4. Patchy airspace disease at the lung bases with bilateral lower lobe consolidation. 5. Cholelithiasis. 6. Aortic atherosclerosis. Critical Value/emergent results were called by telephone at the time of interpretation on 10/31/2023 at 11:59 pm to provider Dr. Antionette Char, who verbally acknowledged these results. Electronically Signed   By: Thornell Sartorius M.D.   On: 10/31/2023 23:59   CT ABDOMEN PELVIS WO CONTRAST Result Date: 10/27/2023 CLINICAL DATA:  Acute nonlocalized abdominal pain. Chills and fever. Vomiting and diarrhea. EXAM: CT ABDOMEN AND PELVIS WITHOUT CONTRAST TECHNIQUE: Multidetector CT imaging of the abdomen and pelvis was performed following the standard protocol without IV contrast. RADIATION DOSE REDUCTION: This exam was performed according to the departmental dose-optimization program which includes automated exposure control, adjustment of the mA and/or kV according to patient size and/or use of iterative reconstruction technique. COMPARISON:  CT abdomen and pelvis 04/19/2021 FINDINGS: Lower chest: Bibasilar atelectasis.  No acute abnormality. Hepatobiliary: Cholelithiasis without evidence of acute cholecystitis. No biliary dilation. Unremarkable noncontrast appearance of the liver. Pancreas: Unremarkable. Spleen: Unremarkable. Adrenals/Urinary Tract: Normal adrenal glands. No urinary calculi or hydronephrosis. Unremarkable bladder. Stomach/Bowel: Stomach is within normal limits. No bowel obstruction. Colonic  diverticulosis. Wall thickening, inflammatory stranding, and free fluid about the descending colon. Loosely organized fluid and gas about the inflamed descending colon (circa series 3/image 49-50) compatible with contained perforation. The adjacent jejunum is inflamed. Postoperative change about the colon with ileocolonic anastomosis in the right upper quadrant. Vascular/Lymphatic: Aortic atherosclerosis. No enlarged abdominal or pelvic lymph nodes. Reproductive: No acute abnormality. Other: Fat containing periumbilical hernia. Musculoskeletal: No acute fracture. IMPRESSION: 1. Acute diverticulitis of the descending colon with contained perforation. 2. Inflamed jejunum adjacent to the perforated diverticulitis. 3.  Aortic Atherosclerosis (ICD10-I70.0). Critical Value/emergent results were called by telephone at the time of interpretation on 10/27/2023 at 9:22 pm to provider [facility] , who verbally acknowledged these results. Electronically Signed   By: Minerva Fester M.D.   On: 10/27/2023 21:24    Signature  -   Huey Bienenstock M.D on 11/13/2023 at 1:44 PM   -  To page go to [email or web address]

## 2023-11-13 NOTE — Procedures (Signed)
I was present at this dialysis session. I have reviewed the session itself and made appropriate changes.   Filed Weights   11/11/23 0900 11/11/23 1244 11/13/23 0818  Weight: 103.6 kg (S) 98.5 kg 96.1 kg    Recent Labs  Lab 11/11/23 0327 11/13/23 0052  NA 137 136  K 3.7 4.0  CL 102 100  CO2 22 24  GLUCOSE 161* 128*  BUN 55* 39*  CREATININE 7.89* 7.04*  CALCIUM 7.5* 7.6*  PHOS 2.2*  --     Recent Labs  Lab 11/09/23 0958 11/10/23 0139 11/11/23 0327 11/12/23 0420 11/13/23 0052  WBC 24.7* 21.5* 22.5* 22.0* 18.8*  NEUTROABS 22.0* 15.9* 18.9*  --   --   HGB 8.7* 7.9* 8.0* 8.0* 7.6*  HCT 26.8* 24.5* 24.8* 24.9* 23.9*  MCV 81.7 82.8 83.2 83.0 83.9  PLT 300 300 331 326 326    Scheduled Meds:  apixaban  5 mg Oral BID   carvedilol  6.25 mg Oral BID WC   Chlorhexidine Gluconate Cloth  6 each Topical Q0600   darbepoetin (ARANESP) injection - DIALYSIS  60 mcg Subcutaneous Q Mon-1800   insulin aspart  0-15 Units Subcutaneous TID WC   insulin aspart  0-5 Units Subcutaneous QHS   insulin glargine-yfgn  12 Units Subcutaneous Daily   mouth rinse  15 mL Mouth Rinse 4 times per day   pantoprazole (PROTONIX) IV  40 mg Intravenous Q12H   sodium chloride flush  10-40 mL Intracatheter Q12H   Continuous Infusions:  piperacillin-tazobactam (ZOSYN)  IV 2.25 g (11/13/23 0610)   PRN Meds:.acetaminophen **OR** acetaminophen, albuterol, hydrALAZINE, HYDROmorphone (DILAUDID) injection, menthol-cetylpyridinium, ondansetron (ZOFRAN) IV, mouth rinse, oxyCODONE   Louie Bun,  MD 11/13/2023, 11:25 AM

## 2023-11-13 NOTE — Progress Notes (Signed)
Palliative-   Chart reviewed. Patient tolerating HD well. Being evaluated for CIR.  Palliative will follow peripherally- please call if needs arise.   Ocie Bob, AGNP-C Palliative Medicine  No charge

## 2023-11-13 NOTE — Progress Notes (Signed)
Received patient in bed to unit.  Alert and oriented.  Informed consent signed and in chart.   TX duration:3hr 8 min - tx terminated for ECC beginning to clot  Patient tolerated well.  Transported back to the room  Alert, without acute distress.  Hand-off given to patient's nurse.   Access used: RTDC Access issues: frequent venous alarms  Total UF removed: 2L Medication(s) given: post tx heparin blocks   11/13/23 1153  Vitals  Temp 98 F (36.7 C)  BP (!) 167/65  MAP (mmHg) 94  Pulse Rate 79  ECG Heart Rate 79  Oxygen Therapy  SpO2 100 %  During Treatment Monitoring  Blood Flow Rate (mL/min) 199 mL/min  Arterial Pressure (mmHg) -70.7 mmHg  Venous Pressure (mmHg) 87.07 mmHg  TMP (mmHg) -80.2 mmHg  Ultrafiltration Rate (mL/min) 1853 mL/min  Dialysate Flow Rate (mL/min) 300 ml/min  Dialysate Potassium Concentration 3  Dialysate Calcium Concentration 2.5  Duration of HD Treatment -hour(s) 3.13 hour(s)  Cumulative Fluid Removed (mL) per Treatment  2952.84  HD Safety Checks Performed Yes  Intra-Hemodialysis Comments Tx completed  Dialysis Fluid Bolus Normal Saline  Bolus Amount (mL) 300 mL  Post Treatment  Dialyzer Clearance Heavily streaked  Liters Processed 56.3  Fluid Removed (mL) 2000 mL  Tolerated HD Treatment Yes  Hemodialysis Catheter Right Internal jugular Double lumen Permanent (Tunneled)  Placement Date/Time: 11/12/23 0826   Placed prior to admission: No  Serial / Lot #: [id number]  Expiration Date: 06/20/28  Time Out: Correct patient;Correct site;Correct procedure  Maximum sterile barrier precautions: Hand hygiene;Large sterile sheet;...  Site Condition No complications  Blue Lumen Status Flushed;Heparin locked  Red Lumen Status Flushed;Heparin locked  Catheter fill solution Heparin 1000 units/ml  Catheter fill volume (Arterial) 1.6 cc  Catheter fill volume (Venous) 1.6  Dressing Type Transparent  Post treatment catheter status Capped and Clamped       Freddi Starr, RN [facility]

## 2023-11-13 NOTE — TOC Progression Note (Signed)
Transition of Care [facility]) - Progression Note    Patient Details  Name: [name] MRN: [id number] Date of Birth: 01/08/1956  [facility]) CM/SW Contact  Mearl Latin, LCSW Phone Number: 11/13/2023, 3:31 PM  Clinical Narrative:    CSW following for CIR insurance determination after appeal.    Expected Discharge Plan: IP Rehab Facility Barriers to Discharge: Continued Medical Work up, English as a second language teacher  Expected Discharge Plan and Services In-house Referral: Clinical Social Work   Post Acute Care Choice: IP Rehab Living arrangements for the past 2 months: Single Family Home                                       Social Determinants of Health (SDOH) Interventions SDOH Screenings   Food Insecurity: No Food Insecurity (10/28/2023)  Housing: Low Risk  (10/28/2023)  Transportation Needs: No Transportation Needs (10/28/2023)  Utilities: Not At Risk (10/28/2023)  Depression (PHQ2-9): Low Risk  (02/28/2021)  Financial Resource Strain: Low Risk  (08/25/2021)   Received from [facility], [facility]  Physical Activity: Insufficiently Active (06/20/2021)   Received from [facility], [facility]  Social Connections: Socially Isolated (10/28/2023)  Stress: No Stress Concern Present (08/25/2021)   Received from [facility], [facility]  Tobacco Use: Low Risk  (11/02/2023)    Readmission Risk Interventions     No data to display

## 2023-11-13 NOTE — Progress Notes (Signed)
Patient ID: [name], female   DOB: 1956/09/11, 68 y.o.   MRN: [id number] [facility] Progress Note   Assessment/ Plan:   1.  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  After initial refusal of wanting to pursue renal replacement therapy, she changed her mind and asked to start dialysis with the intention of undergoing surgery for contained perforation of acute diverticulitis.   -Maintain dialysis on MWF schedule -TDC w/ IR on 2/18, appreciate help -Palliative care has been involved and patient wishes to continue with dialysis for now.   -CLIP in process -Would hold on permanent access creation for now given overall clinical picture 2.  Acute diverticulitis with contained perforation of descending colon: On Zosyn and now status post exploratory laparotomy with Hartman's resection for perforated diverticulitis/peritonitis (2/8).  Management per surgery and primary team 3.  Anion gap metabolic acidosis: Resolved with renal replacement therapy 4.  Anemia: Has received blood and IV iron. ESA also ordered 5. Dispo: eval for CIR. Will need an antibiotic plan if she DC's and gets antibiotics at HD unit as we cannot do zosyn  Subjective:    Seen on dialysis with no complaints except she does feel tired.   Objective:   BP (!) 185/78   Pulse 81   Temp 98 F (36.7 C) (Oral)   Resp 20   Ht 5' 7.01" (1.702 m)   Wt 96.1 kg   SpO2 100%   BMI 33.17 kg/m   Intake/Output Summary (Last 24 hours) at 11/13/2023 1125 Last data filed at 11/13/2023 0600 Gross per 24 hour  Intake 360 ml  Output 200 ml  Net 160 ml   Weight change:   Physical Exam: Gen: Lying in bed with no distress CVS: Normal rate, no rub Resp: Bilateral chest rise with no increased work of breathing Abd: Minimal distention, colostomy bag in place Ext: Trace ankle edema.   Warm and well-perfused  Imaging: IR Fluoro Guide CV Line Right Result Date: 11/12/2023 CLINICAL DATA:  End-stage renal disease and status post placement of non tunneled temporary dialysis catheter on 11/01/2023. The patient now requires placement of a tunneled dialysis catheter for further longer-term hemodialysis. EXAM: TUNNELED CENTRAL VENOUS HEMODIALYSIS CATHETER PLACEMENT WITH ULTRASOUND AND FLUOROSCOPIC GUIDANCE ANESTHESIA/SEDATION: Moderate (conscious) sedation was employed during this procedure. A total of Versed 1.0 mg and Fentanyl 50 mcg was administered intravenously. Moderate Sedation Time: 16 minutes. The patient's level of consciousness and vital signs were monitored continuously by radiology nursing throughout the procedure under my direct supervision. MEDICATIONS: The patient is receiving IV Rocephin every 8 hours in the hospital and did not receive additional antibiotics for the procedure. FLUOROSCOPY: 24 seconds.  2.3 mGy. PROCEDURE: The procedure, risks, benefits, and alternatives were explained to the patient. Questions regarding the procedure were encouraged and answered. The patient understands and consents to the procedure. A timeout was performed prior to initiating the procedure. A non tunneled temporary right-sided dialysis catheter was removed prior to the procedure and pressure held at the exit site. The right neck and chest were prepped with chlorhexidine in a sterile fashion, and a sterile drape was applied covering the operative field. Maximum barrier sterile technique with sterile gowns and gloves were used for the procedure. Local anesthesia was provided with 1% lidocaine. Ultrasound was used to confirm patency of the right internal jugular vein. An ultrasound image was saved and recorded. After creating  a small venotomy incision, a 21 gauge needle was advanced into the right internal jugular vein under direct, real-time ultrasound guidance. Ultrasound image documentation was  performed. After securing guidewire access, an 8 Fr dilator was placed. A J-wire was kinked to measure appropriate catheter length. A Palindrome tunneled hemodialysis catheter measuring 19 cm from tip to cuff was chosen for placement. This was tunneled in a retrograde fashion from the chest wall to the venotomy incision. At the venotomy, serial dilatation was performed and a 15 Fr peel-away sheath was placed over a guidewire. The catheter was then placed through the sheath and the sheath removed. Final catheter positioning was confirmed and documented with a fluoroscopic spot image. The catheter was aspirated, flushed with saline, and injected with appropriate volume heparin dwells. The venotomy incision was closed with subcuticular 4-0 Vicryl. Dermabond was applied to the incision. The catheter exit site was secured with 0-Prolene retention sutures. COMPLICATIONS: None.  No pneumothorax. FINDINGS: After catheter placement, the tip lies in the right atrium. The catheter aspirates normally and is ready for immediate use. IMPRESSION: Placement of tunneled hemodialysis catheter via new puncture of the right internal jugular vein after removal of the non tunneled catheter. The new catheter tip lies in the right atrium. The catheter is ready for immediate use. Electronically Signed   By: Irish Lack M.D.   On: 11/12/2023 10:45   IR US Guide Vasc Access Right Result Date: 11/12/2023 CLINICAL DATA:  End-stage renal disease and status post placement of non tunneled temporary dialysis catheter on 11/01/2023. The patient now requires placement of a tunneled dialysis catheter for further longer-term hemodialysis. EXAM: TUNNELED CENTRAL VENOUS HEMODIALYSIS CATHETER PLACEMENT WITH ULTRASOUND AND FLUOROSCOPIC GUIDANCE ANESTHESIA/SEDATION: Moderate (conscious) sedation was employed during this procedure. A total of Versed 1.0 mg and Fentanyl 50 mcg was administered intravenously. Moderate Sedation Time: 16 minutes. The  patient's level of consciousness and vital signs were monitored continuously by radiology nursing throughout the procedure under my direct supervision. MEDICATIONS: The patient is receiving IV Rocephin every 8 hours in the hospital and did not receive additional antibiotics for the procedure. FLUOROSCOPY: 24 seconds.  2.3 mGy. PROCEDURE: The procedure, risks, benefits, and alternatives were explained to the patient. Questions regarding the procedure were encouraged and answered. The patient understands and consents to the procedure. A timeout was performed prior to initiating the procedure. A non tunneled temporary right-sided dialysis catheter was removed prior to the procedure and pressure held at the exit site. The right neck and chest were prepped with chlorhexidine in a sterile fashion, and a sterile drape was applied covering the operative field. Maximum barrier sterile technique with sterile gowns and gloves were used for the procedure. Local anesthesia was provided with 1% lidocaine. Ultrasound was used to confirm patency of the right internal jugular vein. An ultrasound image was saved and recorded. After creating a small venotomy incision, a 21 gauge needle was advanced into the right internal jugular vein under direct, real-time ultrasound guidance. Ultrasound image documentation was performed. After securing guidewire access, an 8 Fr dilator was placed. A J-wire was kinked to measure appropriate catheter length. A Palindrome tunneled hemodialysis catheter measuring 19 cm from tip to cuff was chosen for placement. This was tunneled in a retrograde fashion from the chest wall to the venotomy incision. At the venotomy, serial dilatation was performed and a 15 Fr peel-away sheath was placed over a guidewire. The catheter was then placed through the sheath and the sheath removed. Final catheter positioning was  confirmed and documented with a fluoroscopic spot image. The catheter was aspirated, flushed with  saline, and injected with appropriate volume heparin dwells. The venotomy incision was closed with subcuticular 4-0 Vicryl. Dermabond was applied to the incision. The catheter exit site was secured with 0-Prolene retention sutures. COMPLICATIONS: None.  No pneumothorax. FINDINGS: After catheter placement, the tip lies in the right atrium. The catheter aspirates normally and is ready for immediate use. IMPRESSION: Placement of tunneled hemodialysis catheter via new puncture of the right internal jugular vein after removal of the non tunneled catheter. The new catheter tip lies in the right atrium. The catheter is ready for immediate use. Electronically Signed   By: Irish Lack M.D.   On: 11/12/2023 10:45      Labs: BMET Recent Labs  Lab 11/07/23 0319 11/07/23 1431 11/08/23 0620 11/09/23 0958 11/10/23 0139 11/11/23 0327 11/13/23 0052  NA 136 133* 134* 133* 137 137 136  K 3.9 4.1 3.1* 3.4* 3.4* 3.7 4.0  CL 96* 97* 95* 95* 99 102 100  CO2 20* 18* 22 25 24 22 24   GLUCOSE 200* 216* 344* 358* 122* 161* 128*  BUN 38* 45* 55* 38* 45* 55* 39*  CREATININE 5.48* 6.34* 7.14* 5.43* 6.44* 7.89* 7.04*  CALCIUM 7.6* 7.6* 7.4* 7.3* 7.3* 7.5* 7.6*  PHOS  --  5.5* 4.6 2.6 2.3* 2.2*  --    CBC Recent Labs  Lab 11/08/23 0620 11/09/23 0958 11/10/23 0139 11/11/23 0327 11/12/23 0420 11/13/23 0052  WBC 27.8* 24.7* 21.5* 22.5* 22.0* 18.8*  NEUTROABS 22.9* 22.0* 15.9* 18.9*  --   --   HGB 9.2* 8.7* 7.9* 8.0* 8.0* 7.6*  HCT 28.2* 26.8* 24.5* 24.8* 24.9* 23.9*  MCV 80.8 81.7 82.8 83.2 83.0 83.9  PLT 315 300 300 331 326 326    Medications:     apixaban  5 mg Oral BID   carvedilol  6.25 mg Oral BID WC   Chlorhexidine Gluconate Cloth  6 each Topical Q0600   darbepoetin (ARANESP) injection - DIALYSIS  60 mcg Subcutaneous Q Mon-1800   insulin aspart  0-15 Units Subcutaneous TID WC   insulin aspart  0-5 Units Subcutaneous QHS   insulin glargine-yfgn  12 Units Subcutaneous Daily   mouth rinse  15  mL Mouth Rinse 4 times per day   pantoprazole (PROTONIX) IV  40 mg Intravenous Q12H   sodium chloride flush  10-40 mL Intracatheter Q12H   Darnell Level  11/13/2023, 11:25 AM

## 2023-11-13 NOTE — Plan of Care (Signed)
Pt has rested quietly throughout the night with no distress noted. Alert and oriented. On O22LNC. SR on the monitor. Tunnel cath intact to right chest with dressing CDI. CVC intact to right chest with dressing CDI. Heparin infusing at 15. Colostomy intact with very little drainage. Up to BSD to void-voided 200cc. Medicated once at bedtime for pain with relief noted. No other complaints voiced.      Problem: Education: Goal: Ability to describe self-care measures that may prevent or decrease complications (Diabetes Survival Skills Education) will improve Outcome: Progressing Goal: Individualized Educational Video(s) Outcome: Progressing   Problem: Coping: Goal: Ability to adjust to condition or change in health will improve Outcome: Progressing   Problem: Fluid Volume: Goal: Ability to maintain a balanced intake and output will improve Outcome: Progressing   Problem: Health Behavior/Discharge Planning: Goal: Ability to identify and utilize available resources and services will improve Outcome: Progressing Goal: Ability to manage health-related needs will improve Outcome: Progressing   Problem: Metabolic: Goal: Ability to maintain appropriate glucose levels will improve Outcome: Progressing   Problem: Nutritional: Goal: Maintenance of adequate nutrition will improve Outcome: Progressing Goal: Progress toward achieving an optimal weight will improve Outcome: Progressing   Problem: Skin Integrity: Goal: Risk for impaired skin integrity will decrease Outcome: Progressing   Problem: Tissue Perfusion: Goal: Adequacy of tissue perfusion will improve Outcome: Progressing   Problem: Education: Goal: Knowledge of General Education information will improve Description: Including pain rating scale, medication(s)/side effects and non-pharmacologic comfort measures Outcome: Progressing   Problem: Health Behavior/Discharge Planning: Goal: Ability to manage health-related needs will  improve Outcome: Progressing   Problem: Clinical Measurements: Goal: Ability to maintain clinical measurements within normal limits will improve Outcome: Progressing Goal: Will remain free from infection Outcome: Progressing Goal: Diagnostic test results will improve Outcome: Progressing Goal: Respiratory complications will improve Outcome: Progressing Goal: Cardiovascular complication will be avoided Outcome: Progressing   Problem: Activity: Goal: Risk for activity intolerance will decrease Outcome: Progressing   Problem: Nutrition: Goal: Adequate nutrition will be maintained Outcome: Progressing   Problem: Coping: Goal: Level of anxiety will decrease Outcome: Progressing   Problem: Elimination: Goal: Will not experience complications related to bowel motility Outcome: Progressing Goal: Will not experience complications related to urinary retention Outcome: Progressing   Problem: Pain Managment: Goal: General experience of comfort will improve and/or be controlled Outcome: Progressing   Problem: Safety: Goal: Ability to remain free from injury will improve Outcome: Progressing   Problem: Skin Integrity: Goal: Risk for impaired skin integrity will decrease Outcome: Progressing   Problem: Education: Goal: Knowledge of the prescribed therapeutic regimen will improve Outcome: Progressing   Problem: Coping: Goal: Ability to identify and develop effective coping behavior will improve Outcome: Progressing   Problem: Clinical Measurements: Goal: Quality of life will improve Outcome: Progressing   Problem: Respiratory: Goal: Verbalizations of increased ease of respirations will increase Outcome: Progressing   Problem: Role Relationship: Goal: Family's ability to cope with current situation will improve Outcome: Progressing Goal: Ability to verbalize concerns, feelings, and thoughts to partner or family member will improve Outcome: Progressing   Problem: Pain  Management: Goal: Satisfaction with pain management regimen will improve Outcome: Progressing   Problem: Education: Goal: Knowledge of disease and its progression will improve Outcome: Progressing Goal: Individualized Educational Video(s) Outcome: Progressing   Problem: Fluid Volume: Goal: Compliance with measures to maintain balanced fluid volume will improve Outcome: Progressing   Problem: Health Behavior/Discharge Planning: Goal: Ability to manage health-related needs will improve Outcome: Progressing  Problem: Nutritional: Goal: Ability to make healthy dietary choices will improve Outcome: Progressing   Problem: Clinical Measurements: Goal: Complications related to the disease process, condition or treatment will be avoided or minimized Outcome: Progressing

## 2023-11-13 NOTE — Progress Notes (Signed)
Inpatient Rehab Admissions Coordinator:   Received a voicemail from an [place] with [facility] (servicing [employer] plans) at 9 AM this morning indicating request for CIR was with [facility] [facility] and if denied we would have 4 hours to request a peer to peer.  I received a denial at 1:28, and called for peer to peer immediately.  I was told by a different representative, Zollie Scale, that the information given to me this morning was incorrect, and that for inpatient admissions to IRF or SNF peer to peer information had to be provided prior to a determination.  She relayed that denial was due to lack of medical necessity.  We will appeal this decision, as pt would be at significantly high risk for infection, readmission, etc if admitted to a lower level of care.    Estill Dooms, PT, DPT Admissions Coordinator [phone] 11/13/23  1:51 PM

## 2023-11-13 NOTE — Progress Notes (Signed)
PHARMACY - ANTICOAGULATION CONSULT NOTE  Pharmacy Consult for heparin Indication: atrial fibrillation  Labs: Recent Labs    11/10/23 0139 11/11/23 0327 11/12/23 0420 11/12/23 0429 11/13/23 0052  HGB 7.9* 8.0* 8.0*  --  7.6*  HCT 24.5* 24.8* 24.9*  --  23.9*  PLT 300 331 326  --  326  HEPARINUNFRC 0.59 0.65  --  0.96* 0.30  CREATININE 6.44* 7.89*  --   --   --    Assessment/Plan:  68yo female therapeutic on heparin after resumed post cath. Will continue infusion at current rate of 1700 units/hr until DOAC resumed this am.  Vernard Gambles, PharmD, BCPS 11/13/2023 1:24 AM

## 2023-11-13 NOTE — Progress Notes (Signed)
Inpatient Rehab Admissions Coordinator:   Insurance auth pending.  Will follow.   Estill Dooms, PT, DPT Admissions Coordinator [phone] 11/13/23  9:45 AM

## 2023-11-14 DIAGNOSIS — K5732 Diverticulitis of large intestine without perforation or abscess without bleeding: Secondary | ICD-10-CM | POA: Diagnosis not present

## 2023-11-14 LAB — TYPE AND SCREEN
ABO/RH(D): A POS
Antibody Screen: NEGATIVE
Unit division: 0

## 2023-11-14 LAB — BPAM RBC
Blood Product Expiration Date: 202502242359
ISSUE DATE / TIME: 202502191526
Unit Type and Rh: 5100

## 2023-11-14 LAB — BASIC METABOLIC PANEL
Anion gap: 12 (ref 5–15)
BUN: 25 mg/dL — ABNORMAL HIGH (ref 8–23)
CO2: 26 mmol/L (ref 22–32)
Calcium: 7.7 mg/dL — ABNORMAL LOW (ref 8.9–10.3)
Chloride: 97 mmol/L — ABNORMAL LOW (ref 98–111)
Creatinine, Ser: 5.39 mg/dL — ABNORMAL HIGH (ref 0.44–1.00)
GFR, Estimated: 8 mL/min — ABNORMAL LOW (ref >60)
Glucose, Bld: 152 mg/dL — ABNORMAL HIGH (ref 70–99)
Potassium: 3.7 mmol/L (ref 3.5–5.1)
Sodium: 135 mmol/L (ref 135–145)

## 2023-11-14 LAB — CBC
HCT: 26 % — ABNORMAL LOW (ref 36.0–46.0)
Hemoglobin: 8.2 g/dL — ABNORMAL LOW (ref 12.0–15.0)
MCH: 26.5 pg (ref 26.0–34.0)
MCHC: 31.5 g/dL (ref 30.0–36.0)
MCV: 84.1 fL (ref 80.0–100.0)
Platelets: 306 10*3/uL (ref 150–400)
RBC: 3.09 MIL/uL — ABNORMAL LOW (ref 3.87–5.11)
RDW: 16.5 % — ABNORMAL HIGH (ref 11.5–15.5)
WBC: 15.8 10*3/uL — ABNORMAL HIGH (ref 4.0–10.5)
nRBC: 0 % (ref 0.0–0.2)

## 2023-11-14 LAB — GLUCOSE, CAPILLARY
Glucose-Capillary: 124 mg/dL — ABNORMAL HIGH (ref 70–99)
Glucose-Capillary: 192 mg/dL — ABNORMAL HIGH (ref 70–99)
Glucose-Capillary: 144 mg/dL — ABNORMAL HIGH (ref 70–99)
Glucose-Capillary: 111 mg/dL — ABNORMAL HIGH (ref 70–99)

## 2023-11-14 NOTE — Progress Notes (Addendum)
Patient ID: [name], female   DOB: 1956-04-01, 68 y.o.   MRN: [id number] [facility] Progress Note   Assessment/ Plan:   1.  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  After initial refusal of wanting to pursue renal replacement therapy, she changed her mind and asked to start dialysis with the intention of undergoing surgery for contained perforation of acute diverticulitis.   -Maintain dialysis on MWF schedule -TDC w/ IR on 2/18, appreciate help -Palliative care has been involved and patient wishes to continue with dialysis for now.   -CLIP in process; probably going to CIR. -Would hold on permanent access creation for now given overall clinical picture 2.  Acute diverticulitis with contained perforation of descending colon: On Zosyn and now status post exploratory laparotomy with Hartman's resection for perforated diverticulitis/peritonitis (2/8).  Management per surgery and primary team 3.  Anion gap metabolic acidosis: Resolved with renal replacement therapy 4.  Anemia: Has received blood and IV iron. ESA also ordered 5. HTN: BP up and down, increase UF with HD, cont current meds 6. Dispo: eval for CIR. Will need an antibiotic plan if she DC's and gets antibiotics at HD unit as we cannot do zosyn.  But given she is likely going to CIR not a big issue right now  Subjective:    Patient feels well today with no complaints.  Tolerated dialysis yesterday with no issues.   Objective:   BP (!) 180/75 (BP Location: Right Arm)   Pulse 77   Temp 97.9 F (36.6 C) (Oral)   Resp 18   Ht 5' 7.01" (1.702 m)   Wt 94.1 kg   SpO2 100%   BMI 32.48 kg/m   Intake/Output Summary (Last 24 hours) at 11/14/2023 1122 Last data filed at 11/14/2023 0554 Gross per 24 hour  Intake 1155 ml  Output 3000 ml  Net -1845 ml   Weight change:    Physical Exam: Gen: Sitting up on the side of the bed with no distress CVS: Normal rate, no rub Resp: Bilateral chest rise with no increased work of breathing Abd: Minimal distention, colostomy bag in place Ext: Trace ankle edema.  Warm and well-perfused  Imaging: No results found.     Labs: BMET Recent Labs  Lab 11/07/23 1431 11/08/23 0620 11/09/23 0958 11/10/23 0139 11/11/23 0327 11/13/23 0052 11/14/23 0354  NA 133* 134* 133* 137 137 136 135  K 4.1 3.1* 3.4* 3.4* 3.7 4.0 3.7  CL 97* 95* 95* 99 102 100 97*  CO2 18* 22 25 24 22 24 26   GLUCOSE 216* 344* 358* 122* 161* 128* 152*  BUN 45* 55* 38* 45* 55* 39* 25*  CREATININE 6.34* 7.14* 5.43* 6.44* 7.89* 7.04* 5.39*  CALCIUM 7.6* 7.4* 7.3* 7.3* 7.5* 7.6* 7.7*  PHOS 5.5* 4.6 2.6 2.3* 2.2*  --   --    CBC Recent Labs  Lab 11/08/23 0620 11/09/23 0958 11/10/23 0139 11/11/23 0327 11/12/23 0420 11/13/23 0052 11/14/23 0354  WBC 27.8* 24.7* 21.5* 22.5* 22.0* 18.8* 15.8*  NEUTROABS 22.9* 22.0* 15.9* 18.9*  --   --   --   HGB 9.2* 8.7* 7.9* 8.0* 8.0* 7.6* 8.2*  HCT 28.2* 26.8* 24.5* 24.8* 24.9* 23.9* 26.0*  MCV 80.8 81.7 82.8 83.2 83.0 83.9 84.1  PLT 315 300 300 331 326 326 306    Medications:     amLODipine  5 mg Oral Daily  apixaban  5 mg Oral BID   carvedilol  6.25 mg Oral BID WC   Chlorhexidine Gluconate Cloth  6 each Topical Q0600   darbepoetin (ARANESP) injection - DIALYSIS  60 mcg Subcutaneous Q Mon-1800   insulin aspart  0-15 Units Subcutaneous TID WC   insulin aspart  0-5 Units Subcutaneous QHS   insulin glargine-yfgn  12 Units Subcutaneous Daily   mouth rinse  15 mL Mouth Rinse 4 times per day   pantoprazole (PROTONIX) IV  40 mg Intravenous Q12H   sodium chloride flush  10-40 mL Intracatheter Q12H   [name]  11/14/2023, 11:22 AM

## 2023-11-14 NOTE — Progress Notes (Signed)
Physical Therapy Treatment Patient Details Name: [name] MRN: [id number] DOB: 01-05-1956 Today's Date: 11/14/2023   History of Present Illness Patient is a 68 y.o.  female - who presented with abdominal pain-found to have acute diverticulitis with contained perforation involving the descending colon. 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  Past medical history of CKD 5, A-fib on Eliquis, CAD    PT Comments  Pt tolerated treatment well today. Pt today was able to progress ambulation in hallway with RW Min A +2 for chair follow. No change in DC/DME recs at this time. Pt remains an excellent candidate for CIR as pt is highly motivated, can tolerate 3 hours of therapy a day, and has adequate support at home upon DC. PT will continue to follow.     If plan is discharge home, recommend the following: A little help with walking and/or transfers;A little help with bathing/dressing/bathroom;Assistance with cooking/housework;Assist for transportation;Help with stairs or ramp for entrance   Can travel by private vehicle        Equipment Recommendations  Other (comment) (Per accepting facility)    Recommendations for Other Services       Precautions / Restrictions Precautions Precautions: Fall Recall of Precautions/Restrictions: Intact Restrictions Weight Bearing Restrictions Per Provider Order: No     Mobility  Bed Mobility Overal bed mobility: Needs Assistance Bed Mobility: Supine to Sit, Sit to Supine     Supine to sit: Min assist Sit to supine: Contact guard assist   General bed mobility comments: Min A for trunk elevation. CGA to return to bed.    Transfers Overall transfer level: Needs assistance Equipment used: Rolling walker (2 wheels) Transfers: Sit to/from Stand Sit to Stand: Min assist           General transfer comment: pt with good hand placement. Min A to power up and steady.    Ambulation/Gait Ambulation/Gait assistance: Min assist,  +2 safety/equipment (chair follow) Gait Distance (Feet): 50 Feet Assistive device: Rolling walker (2 wheels) Gait Pattern/deviations: Wide base of support, Drifts right/left, Decreased stride length, Step-through pattern, Trunk flexed Gait velocity: decreased     General Gait Details: 1 standing rest break. Min A for obstacle navigation. Cues for proximity to RW. Chair follow provided   Stairs             Wheelchair Mobility     Tilt Bed    Modified Rankin (Stroke Patients Only)       Balance Overall balance assessment: Needs assistance Sitting-balance support: Feet supported, Bilateral upper extremity supported Sitting balance-Leahy Scale: Good Sitting balance - Comments: static sitting EOB   Standing balance support: Single extremity supported, During functional activity Standing balance-Leahy Scale: Fair                              Hotel manager: No apparent difficulties  Cognition Arousal: Alert Behavior During Therapy: WFL for tasks assessed/performed   PT - Cognitive impairments: No apparent impairments                         Following commands: Intact      Cueing Cueing Techniques: Verbal cues  Exercises      General Comments General comments (skin integrity, edema, etc.): SpO2 89%-93% on RA.      Pertinent Vitals/Pain Pain Assessment Pain Assessment: No/denies pain    Home Living  Prior Function            PT Goals (current goals can now be found in the care plan section) Progress towards PT goals: Progressing toward goals    Frequency    Min 1X/week      PT Plan      Co-evaluation              AM-PAC PT "6 Clicks" Mobility   Outcome Measure  Help needed turning from your back to your side while in a flat bed without using bedrails?: None Help needed moving from lying on your back to sitting on the side of a flat bed without using  bedrails?: A Little Help needed moving to and from a bed to a chair (including a wheelchair)?: A Little Help needed standing up from a chair using your arms (e.g., wheelchair or bedside chair)?: A Little Help needed to walk in hospital room?: A Little Help needed climbing 3-5 steps with a railing? : Total 6 Click Score: 17    End of Session Equipment Utilized During Treatment: Gait belt;Oxygen Activity Tolerance: Patient tolerated treatment well;Patient limited by fatigue Patient left: in bed;with call bell/phone within reach Nurse Communication: Mobility status PT Visit Diagnosis: Other abnormalities of gait and mobility (R26.89)     Time: 8119-1478 PT Time Calculation (min) (ACUTE ONLY): 22 min  Charges:    $Gait Training: 8-22 mins PT General Charges $$ ACUTE PT VISIT: 1 Visit                     [name], PT, DPT Acute Rehab Services [phone]    [name] 11/14/2023, 4:10 PM

## 2023-11-14 NOTE — Progress Notes (Signed)
PROGRESS NOTE        PATIENT DETAILS Name: [name] Age: 68 y.o. Sex: female Date of Birth: [birth year] Admit Date: 10/27/2023 Admitting Physician Dolly Rias, MD ZOX:[id number], Devonne Doughty, NP  Brief Summary: Patient is a 68 y.o.  female with history of CKD 5, A-fib on Eliquis, CAD-who presented with abdominal pain-found to have acute diverticulitis with contained perforation involving the descending colon.  Hospital course complicated by worsening AKI-thought to have progressed to ESRD-requiring initiation of HD.  See below for further details.  Significant events: 2/2>> admit to [facility] 2/6>> CT abdomen worsening diverticulitis with perforation 2/7>> transitioned to full comfort measures-as with worsening AKI-however multiple family members arrived-comfort care revoked-now full code-full scope of treatment including HD/laparotomy with colostomy.  General surgery/nephrology reconsulted.  IR placed [facility] and patient underwent first hemodialysis.  2/8>> remains n.p.o-colectomy/colostomy later today.  Significant studies: 2/2>> CT abdomen/pelvis: Acute diverticulitis-descending colon with contained perforation 2/6>> CT abdomen/pelvis: Worsening diverticulitis of descending colon with perforation and increasing failure-suspected oral contrast extravasation left abdomen.  Significant microbiology data: 2/2>> COVID/influenza/RSV PCR: Negative  Procedures: 2/8>> by Dr. Freida Busman underwent exploratory laparotomy with Hartman's resection for perforated diverticulitis with diffuse peritonitis, colostomy in place. 2/13 - R IJ Trialysis catheter - IR 2/14 -left IJ Central line placed by IR for TNA 11/12/2023. R.internal jugular tunneled HD catheter placement by IR  Consults: Surgery Renal Palliative IR  Subjective:   No significant events overnight, she denies any complaints, no nausea, no vomiting, no fever or chills   Objective: Vitals: Blood pressure (!) 155/75,  pulse 77, temperature 98 F (36.7 C), resp. rate 16, height 5' 7.01" (1.702 m), weight 94.1 kg, SpO2 100%.   Exam:  Awake Alert, Oriented X 3, No new F.N deficits, Normal affect Symmetrical Chest wall movement, Good air movement bilaterally, CTAB RRR,No Gallops,Rubs or new Murmurs, No Parasternal Heave +ve B.Sounds, Abd Soft, midline surgical wound bandaged, with packing, left lower quadrant colostomy with stool present No Cyanosis, Clubbing or edema, No new Rash or bruise     Right upper chest tunneled HD catheter, left upper chest CVC line.   Assessment/Plan:  Acute diverticulitis with contained perforation of the descending colon  -Management Per general surgery, status post Hartman's procedure by Dr. Freida Busman 11/02/2023. -Currently off TPN, appetite has improved, tolerating p.o. intake, DC IV erythromycin . -Wound care per general surgery, wound VAC to start today -Wound care following for colostomy care  -Will DC IV Zosyn given leukocytosis significantly trending down  AKI on CKD 5 with progression to ESRD AKI likely hemodynamically mediated-has now likely progressed to ESRD.  HD initiated via right IJ temporary catheter, switched to right IJ trialysis catheter by IR on 11/07/2023>> tunneled HD catheter right IJ on 11/12/2023 by IR.   CAD Currently no anginal symptoms Suspect not on antiplatelets as on anticoagulation with Eliquis/heparin Continue beta-blocker On Repatha injections as an outpatient.  PAF Sinus rhythm Continue Coreg Currently on Eliquis  Anemia of acute illness  Anemia of end-stage renal disease Brought on by acute illness, underlying renal insufficiency now developing to ESRD, surgical blood loss, gradually trending down, 2 units of packed RBC on 11/04/2023, on IV PPI.  -Received 1 unit PRBC 2/19, continue to monitor and transfuse as needed -IV iron and Procrit per renal  HTN Started to increase, continue with home dose Coreg, will start on amlodipine  uptitrate dose as needed, meanwhile continue with hydralazine as needed.  DM-2 (A1c 6.6 on 2/3) Of TNA now tolerating oral diet, Semglee and sliding scale adjusted.   Recent Labs    11/13/23 2149 11/14/23 0758 11/14/23 1243  GLUCAP 161* 124* 192*       Palliative care/goals of care Palliative input greatly appreciated Class 1 Obesity: Estimated body mass index is 32.48 kg/m as calculated from the following:   Height as of this encounter: 5' 7.01" (1.702 m).   Weight as of this encounter: 94.1 kg.    Code status:   Code Status: Limited: Do not attempt resuscitation (DNR) -DNR-LIMITED -Do Not Intubate/DNI    DVT Prophylaxis: Place and maintain sequential compression device Start: 11/06/23 0613 apixaban (ELIQUIS) tablet 5 mg IV Heparin  Family Communication: Discussed with patient, all her questions answered.  Disposition Plan: Status is: Inpatient Will benefit from CIR, given multiple issues including her wound, will need frequent monitoring and observation by general surgery team, hemodialysis patient, she is on Eliquis she might need transfusions as well.  With her significant abdominal wound and wound VAC, significant deconditioning when she was independent at baseline, she is With prolonged hospital stay, surgery and significant weakness, will benefit from CIR stay to go back to baseline     Diet: Diet Order             Diet regular Room service appropriate? Yes; Fluid consistency: Thin  Diet effective now                   MEDICATIONS: Scheduled Meds:  amLODipine  5 mg Oral Daily   apixaban  5 mg Oral BID   carvedilol  6.25 mg Oral BID WC   Chlorhexidine Gluconate Cloth  6 each Topical Q0600   darbepoetin (ARANESP) injection - DIALYSIS  60 mcg Subcutaneous Q Mon-1800   insulin aspart  0-15 Units Subcutaneous TID WC   insulin aspart  0-5 Units Subcutaneous QHS   insulin glargine-yfgn  12 Units Subcutaneous Daily   mouth rinse  15 mL Mouth Rinse 4 times  per day   pantoprazole (PROTONIX) IV  40 mg Intravenous Q12H   sodium chloride flush  10-40 mL Intracatheter Q12H   Continuous Infusions:   PRN Meds:.acetaminophen **OR** acetaminophen, albuterol, hydrALAZINE, HYDROmorphone (DILAUDID) injection, menthol-cetylpyridinium, ondansetron (ZOFRAN) IV, mouth rinse, oxyCODONE   I have personally reviewed following labs and imaging studies  LABORATORY DATA:   Data Review:    Recent Labs  Lab 11/08/23 0620 11/09/23 0958 11/10/23 0139 11/11/23 0327 11/12/23 0420 11/13/23 0052 11/14/23 0354  WBC 27.8* 24.7* 21.5* 22.5* 22.0* 18.8* 15.8*  HGB 9.2* 8.7* 7.9* 8.0* 8.0* 7.6* 8.2*  HCT 28.2* 26.8* 24.5* 24.8* 24.9* 23.9* 26.0*  PLT 315 300 300 331 326 326 306  MCV 80.8 81.7 82.8 83.2 83.0 83.9 84.1  MCH 26.4 26.5 26.7 26.8 26.7 26.7 26.5  MCHC 32.6 32.5 32.2 32.3 32.1 31.8 31.5  RDW 15.9* 16.0* 16.1* 16.7* 16.7* 16.8* 16.5*  LYMPHSABS 0.8 1.5 1.5 0.5*  --   --   --   MONOABS 2.0* 0.7 3.9* 2.5*  --   --   --   EOSABS 0.4 0.5 0.2 0.7*  --   --   --   BASOSABS 0.2* 0.0 0.0 0.0  --   --   --     Recent Labs  Lab 11/08/23 0620 11/09/23 0958 11/10/23 0139 11/11/23 0327 11/13/23 0052 11/14/23 0354  NA 134* 133* 137 137 136  135  K 3.1* 3.4* 3.4* 3.7 4.0 3.7  CL 95* 95* 99 102 100 97*  CO2 22 25 24 22 24 26   ANIONGAP 17* 13 14 13 12 12   GLUCOSE 344* 358* 122* 161* 128* 152*  BUN 55* 38* 45* 55* 39* 25*  CREATININE 7.14* 5.43* 6.44* 7.89* 7.04* 5.39*  AST  --   --   --  23  --   --   ALT  --   --   --  17  --   --   ALKPHOS  --   --   --  39  --   --   BILITOT  --   --   --  0.5  --   --   ALBUMIN  --   --   --  1.7*  --   --   CRP 16.2* 13.7* 12.1*  --   --   --   PROCALCITON 17.70 16.20 15.86 12.67  --   --   MG 2.1 1.8 1.9 1.8  --   --   PHOS 4.6 2.6 2.3* 2.2*  --   --   CALCIUM 7.4* 7.3* 7.3* 7.5* 7.6* 7.7*      Recent Labs  Lab 11/08/23 0620 11/09/23 0958 11/10/23 0139 11/11/23 0327 11/13/23 0052 11/14/23 0354   CRP 16.2* 13.7* 12.1*  --   --   --   PROCALCITON 17.70 16.20 15.86 12.67  --   --   MG 2.1 1.8 1.9 1.8  --   --   CALCIUM 7.4* 7.3* 7.3* 7.5* 7.6* 7.7*    --------------------------------------------------------------------------------------------------------------- Lab Results  Component Value Date   CHOL 134 12/21/2019   HDL 49 12/21/2019   LDLCALC 69 12/21/2019   TRIG 148 11/11/2023   CHOLHDL 2.7 12/21/2019    Lab Results  Component Value Date   HGBA1C 6.6 (H) 10/28/2023      Micro Results Recent Results (from the past 240 hours)  MRSA Next Gen by PCR, Nasal     Status: None   Collection Time: 11/10/23  5:14 AM   Specimen: Nasal Mucosa; Nasal Swab  Result Value Ref Range Status   MRSA by PCR Next Gen NOT DETECTED NOT DETECTED Final    Comment: (NOTE) The GeneXpert MRSA Assay (FDA approved for NASAL specimens only), is one component of a comprehensive MRSA colonization surveillance program. It is not intended to diagnose MRSA infection nor to guide or monitor treatment for MRSA infections. Test performance is not FDA approved in patients less than 67 years old. Performed at [facility], [address], [place], [place] [address]      Radiology Reports IR Fluoro Guide CV Line Right Result Date: 11/12/2023 CLINICAL DATA:  End-stage renal disease and status post placement of non tunneled temporary dialysis catheter on 11/01/2023. The patient now requires placement of a tunneled dialysis catheter for further longer-term hemodialysis. EXAM: TUNNELED CENTRAL VENOUS HEMODIALYSIS CATHETER PLACEMENT WITH ULTRASOUND AND FLUOROSCOPIC GUIDANCE ANESTHESIA/SEDATION: Moderate (conscious) sedation was employed during this procedure. A total of Versed 1.0 mg and Fentanyl 50 mcg was administered intravenously. Moderate Sedation Time: 16 minutes. The patient's level of consciousness and vital signs were monitored continuously by radiology nursing throughout the procedure under my  direct supervision. MEDICATIONS: The patient is receiving IV Rocephin every 8 hours in the hospital and did not receive additional antibiotics for the procedure. FLUOROSCOPY: 24 seconds.  2.3 mGy. PROCEDURE: The procedure, risks, benefits, and alternatives were explained to the patient.  Questions regarding the procedure were encouraged and answered. The patient understands and consents to the procedure. A timeout was performed prior to initiating the procedure. A non tunneled temporary right-sided dialysis catheter was removed prior to the procedure and pressure held at the exit site. The right neck and chest were prepped with chlorhexidine in a sterile fashion, and a sterile drape was applied covering the operative field. Maximum barrier sterile technique with sterile gowns and gloves were used for the procedure. Local anesthesia was provided with 1% lidocaine. Ultrasound was used to confirm patency of the right internal jugular vein. An ultrasound image was saved and recorded. After creating a small venotomy incision, a 21 gauge needle was advanced into the right internal jugular vein under direct, real-time ultrasound guidance. Ultrasound image documentation was performed. After securing guidewire access, an 8 Fr dilator was placed. A J-wire was kinked to measure appropriate catheter length. A Palindrome tunneled hemodialysis catheter measuring 19 cm from tip to cuff was chosen for placement. This was tunneled in a retrograde fashion from the chest wall to the venotomy incision. At the venotomy, serial dilatation was performed and a 15 Fr peel-away sheath was placed over a guidewire. The catheter was then placed through the sheath and the sheath removed. Final catheter positioning was confirmed and documented with a fluoroscopic spot image. The catheter was aspirated, flushed with saline, and injected with appropriate volume heparin dwells. The venotomy incision was closed with subcuticular 4-0 Vicryl. Dermabond  was applied to the incision. The catheter exit site was secured with 0-Prolene retention sutures. COMPLICATIONS: None.  No pneumothorax. FINDINGS: After catheter placement, the tip lies in the right atrium. The catheter aspirates normally and is ready for immediate use. IMPRESSION: Placement of tunneled hemodialysis catheter via new puncture of the right internal jugular vein after removal of the non tunneled catheter. The new catheter tip lies in the right atrium. The catheter is ready for immediate use. Electronically Signed   By: Irish Lack M.D.   On: 11/12/2023 10:45   IR US Guide Vasc Access Right Result Date: 11/12/2023 CLINICAL DATA:  End-stage renal disease and status post placement of non tunneled temporary dialysis catheter on 11/01/2023. The patient now requires placement of a tunneled dialysis catheter for further longer-term hemodialysis. EXAM: TUNNELED CENTRAL VENOUS HEMODIALYSIS CATHETER PLACEMENT WITH ULTRASOUND AND FLUOROSCOPIC GUIDANCE ANESTHESIA/SEDATION: Moderate (conscious) sedation was employed during this procedure. A total of Versed 1.0 mg and Fentanyl 50 mcg was administered intravenously. Moderate Sedation Time: 16 minutes. The patient's level of consciousness and vital signs were monitored continuously by radiology nursing throughout the procedure under my direct supervision. MEDICATIONS: The patient is receiving IV Rocephin every 8 hours in the hospital and did not receive additional antibiotics for the procedure. FLUOROSCOPY: 24 seconds.  2.3 mGy. PROCEDURE: The procedure, risks, benefits, and alternatives were explained to the patient. Questions regarding the procedure were encouraged and answered. The patient understands and consents to the procedure. A timeout was performed prior to initiating the procedure. A non tunneled temporary right-sided dialysis catheter was removed prior to the procedure and pressure held at the exit site. The right neck and chest were prepped with  chlorhexidine in a sterile fashion, and a sterile drape was applied covering the operative field. Maximum barrier sterile technique with sterile gowns and gloves were used for the procedure. Local anesthesia was provided with 1% lidocaine. Ultrasound was used to confirm patency of the right internal jugular vein. An ultrasound image was saved and recorded. After creating  a small venotomy incision, a 21 gauge needle was advanced into the right internal jugular vein under direct, real-time ultrasound guidance. Ultrasound image documentation was performed. After securing guidewire access, an 8 Fr dilator was placed. A J-wire was kinked to measure appropriate catheter length. A Palindrome tunneled hemodialysis catheter measuring 19 cm from tip to cuff was chosen for placement. This was tunneled in a retrograde fashion from the chest wall to the venotomy incision. At the venotomy, serial dilatation was performed and a 15 Fr peel-away sheath was placed over a guidewire. The catheter was then placed through the sheath and the sheath removed. Final catheter positioning was confirmed and documented with a fluoroscopic spot image. The catheter was aspirated, flushed with saline, and injected with appropriate volume heparin dwells. The venotomy incision was closed with subcuticular 4-0 Vicryl. Dermabond was applied to the incision. The catheter exit site was secured with 0-Prolene retention sutures. COMPLICATIONS: None.  No pneumothorax. FINDINGS: After catheter placement, the tip lies in the right atrium. The catheter aspirates normally and is ready for immediate use. IMPRESSION: Placement of tunneled hemodialysis catheter via new puncture of the right internal jugular vein after removal of the non tunneled catheter. The new catheter tip lies in the right atrium. The catheter is ready for immediate use. Electronically Signed   By: Irish Lack M.D.   On: 11/12/2023 10:45   IR US Guide Vasc Access Left Result Date:  11/08/2023 INDICATION: ESRD.  IV antibiotics. EXAM: ULTRASOUND AND FLUOROSCOPIC GUIDED PLACEMENT OF TUNNELED CENTRAL VENOUS "Powerline" CATHETER MEDICATIONS: The patient was on scheduled IV antibiotics. ANESTHESIA/SEDATION: Local anesthetic was administered. The patient was continuously monitored during the procedure by the interventional radiology nurse under my direct supervision. FLUOROSCOPY TIME:  Fluoroscopic dose; 3.8 mGy COMPLICATIONS: None immediate. PROCEDURE: Informed written consent was obtained from the patient and/or patient's representative after a discussion of the risks, benefits, and alternatives to treatment. Questions regarding the procedure were encouraged and answered. The LEFT neck and chest were prepped with chlorhexidine in a sterile fashion, and a sterile drape was applied covering the operative field. Maximum barrier sterile technique with sterile gowns and gloves were used for the procedure. A timeout was performed prior to the initiation of the procedure. After the overlying soft tissues were anesthetized, a small venotomy incision was created and a micropuncture kit was utilized to access the internal jugular vein. Real-time ultrasound guidance was utilized for vascular access including the acquisition of a permanent ultrasound image documenting patency of the accessed vessel. The microwire was utilized to measure appropriate catheter length. The micropuncture sheath was exchanged for a peel-away sheath over a guidewire. A 5 Fr dual lumen tunneled central venous catheter measuring 33 cm was tunneled in a retrograde fashion from the anterior chest wall to the venotomy incision. The catheter was then placed through the peel-away sheath with tip ultimately positioned at the superior caval-atrial junction. Final catheter positioning was confirmed and documented with a spot radiographic image. The catheter aspirates and flushes normally. The catheter was flushed with appropriate volume  heparin dwells. The catheter exit site was secured with a 3-0 Ethilon retention suture. The venotomy incision was closed with Dermabond. Dressings were applied. The patient tolerated the procedure well without immediate post procedural complication. FINDINGS: After catheter placement, the tip lies within the superior apect of the right atrium. The catheter aspirates and flushes normally and is ready for immediate use. IMPRESSION: Successful placement of 33 cm dual lumen tunneled "Powerline" central venous catheter via the LEFT  internal jugular vein The tip of the catheter is positioned within the proximal RIGHT atrium. The catheter is ready for immediate use. Roanna Banning, MD [facility] Radiology Electronically Signed   By: Roanna Banning M.D.   On: 11/08/2023 18:10   IR Fluoro Guide CV Line Left Result Date: 11/08/2023 INDICATION: ESRD.  IV antibiotics. EXAM: ULTRASOUND AND FLUOROSCOPIC GUIDED PLACEMENT OF TUNNELED CENTRAL VENOUS "Powerline" CATHETER MEDICATIONS: The patient was on scheduled IV antibiotics. ANESTHESIA/SEDATION: Local anesthetic was administered. The patient was continuously monitored during the procedure by the interventional radiology nurse under my direct supervision. FLUOROSCOPY TIME:  Fluoroscopic dose; 3.8 mGy COMPLICATIONS: None immediate. PROCEDURE: Informed written consent was obtained from the patient and/or patient's representative after a discussion of the risks, benefits, and alternatives to treatment. Questions regarding the procedure were encouraged and answered. The LEFT neck and chest were prepped with chlorhexidine in a sterile fashion, and a sterile drape was applied covering the operative field. Maximum barrier sterile technique with sterile gowns and gloves were used for the procedure. A timeout was performed prior to the initiation of the procedure. After the overlying soft tissues were anesthetized, a small venotomy incision  was created and a micropuncture kit was utilized to access the internal jugular vein. Real-time ultrasound guidance was utilized for vascular access including the acquisition of a permanent ultrasound image documenting patency of the accessed vessel. The microwire was utilized to measure appropriate catheter length. The micropuncture sheath was exchanged for a peel-away sheath over a guidewire. A 5 Fr dual lumen tunneled central venous catheter measuring 33 cm was tunneled in a retrograde fashion from the anterior chest wall to the venotomy incision. The catheter was then placed through the peel-away sheath with tip ultimately positioned at the superior caval-atrial junction. Final catheter positioning was confirmed and documented with a spot radiographic image. The catheter aspirates and flushes normally. The catheter was flushed with appropriate volume heparin dwells. The catheter exit site was secured with a 3-0 Ethilon retention suture. The venotomy incision was closed with Dermabond. Dressings were applied. The patient tolerated the procedure well without immediate post procedural complication. FINDINGS: After catheter placement, the tip lies within the superior apect of the right atrium. The catheter aspirates and flushes normally and is ready for immediate use. IMPRESSION: Successful placement of 33 cm dual lumen tunneled "Powerline" central venous catheter via the LEFT internal jugular vein The tip of the catheter is positioned within the proximal RIGHT atrium. The catheter is ready for immediate use. Roanna Banning, MD [facility] Radiology Electronically Signed   By: Roanna Banning M.D.   On: 11/08/2023 18:10   VAS [place] LOWER EXTREMITY VENOUS (DVT) Result Date: 11/07/2023  Lower Venous DVT Study Patient Name:  MARISA HAGE [facility]  Date of Exam:   11/07/2023 Medical Rec #: [id number]          Accession #:    [id number] Date of Birth: [birth year]          Patient  Gender: F Patient Age:   32 years Exam Location:  [facility] Procedure:      VAS [place] LOWER EXTREMITY VENOUS (DVT) Referring Phys: Tresa Endo OSBORNE --------------------------------------------------------------------------------  Indications: Pain, stroke, and A-fib, anemia.  Comparison Study: No prior exam. Performing Technologist: Fernande Bras  Examination Guidelines: A complete evaluation includes B-mode imaging, spectral Doppler, color Doppler, and power Doppler as needed of all accessible portions of each vessel. Bilateral testing is considered an integral part  of a complete examination. Limited examinations for reoccurring indications may be performed as noted. The reflux portion of the exam is performed with the patient in reverse Trendelenburg.  +---------+---------------+---------+-----------+----------+--------------+ RIGHT    CompressibilityPhasicitySpontaneityPropertiesThrombus Aging +---------+---------------+---------+-----------+----------+--------------+ CFV      Full           Yes      Yes                                 +---------+---------------+---------+-----------+----------+--------------+ SFJ      Full           Yes      Yes                                 +---------+---------------+---------+-----------+----------+--------------+ FV Prox  Full                                                        +---------+---------------+---------+-----------+----------+--------------+ FV Mid   Full                                                        +---------+---------------+---------+-----------+----------+--------------+ FV DistalFull                                                        +---------+---------------+---------+-----------+----------+--------------+ PFV      Full                                                        +---------+---------------+---------+-----------+----------+--------------+ POP      Full           Yes       Yes                                 +---------+---------------+---------+-----------+----------+--------------+ PTV      Full                                                        +---------+---------------+---------+-----------+----------+--------------+ PERO     Full                                                        +---------+---------------+---------+-----------+----------+--------------+   +---------+---------------+---------+-----------+----------+--------------+ LEFT     CompressibilityPhasicitySpontaneityPropertiesThrombus Aging +---------+---------------+---------+-----------+----------+--------------+ CFV      Full  Yes      Yes                                 +---------+---------------+---------+-----------+----------+--------------+ SFJ      Full           Yes      Yes                                 +---------+---------------+---------+-----------+----------+--------------+ FV Prox  Full                                                        +---------+---------------+---------+-----------+----------+--------------+ FV Mid   Full                                                        +---------+---------------+---------+-----------+----------+--------------+ FV DistalFull                                                        +---------+---------------+---------+-----------+----------+--------------+ PFV      Full                                                        +---------+---------------+---------+-----------+----------+--------------+ POP      Full           Yes      Yes                                 +---------+---------------+---------+-----------+----------+--------------+ PTV      Full                                                        +---------+---------------+---------+-----------+----------+--------------+ PERO     Full                                                         +---------+---------------+---------+-----------+----------+--------------+     Summary: BILATERAL: - No evidence of deep vein thrombosis seen in the lower extremities, bilaterally. -No evidence of popliteal cyst, bilaterally.   *See table(s) above for measurements and observations. Electronically signed by Heath Lark on 11/07/2023 at 4:05:14 PM.    Final    CT ABDOMEN PELVIS WO CONTRAST Result Date: 11/06/2023 CLINICAL DATA:  Diverticulitis, complication suspected EXAM: CT ABDOMEN AND PELVIS WITHOUT CONTRAST TECHNIQUE: Multidetector CT imaging of the  abdomen and pelvis was performed following the standard protocol without IV contrast. RADIATION DOSE REDUCTION: This exam was performed according to the departmental dose-optimization program which includes automated exposure control, adjustment of the mA and/or kV according to patient size and/or use of iterative reconstruction technique. COMPARISON:  10/31/2023 FINDINGS: Lower chest: Volume loss and opacity in the lower lungs consistent with atelectasis. There is a central line with tip at the upper right atrium. Coronary atherosclerosis. Hepatobiliary: No focal liver abnormality. Calcific in the intermediate density in the gallbladder attributed to stones. No biliary dilatation or pericholecystic edema. Pancreas: Unremarkable. Spleen: Unremarkable. Adrenals/Urinary Tract: Negative adrenals. No hydronephrosis or stone. Renal cortical thinning and lobulation at the left upper pole. 3 mm stone at the upper pole left kidney. The urinary bladder is collapsed around a Foley catheter. Stomach/Bowel: Descending colostomy. Bowel anastomosis in the right abdomen. No evidence of bowel obstruction. Fluid collection with loculation appearance/peritoneal thickening along the left flank measuring up to 5 x 6 x 10 cm. Milder fluid accumulation in the ventral peritoneal space. Vascular/Lymphatic: Extensive atheromatous calcification of the aorta and iliacs. No mass or  adenopathy. Reproductive:No pathologic findings. Other: No ascites or pneumoperitoneum. Musculoskeletal: No acute abnormalities.  T9 butterfly vertebra IMPRESSION: 1. Interval colostomy with fluid collection along the left gutter, dominant pocket measuring 10 x 6 x 5 cm. 2. No bowel obstruction. 3. Cholelithiasis and left nephrolithiasis. Electronically Signed   By: Tiburcio Pea M.D.   On: 11/06/2023 07:12   DG Chest Port 1 View Result Date: 11/03/2023 CLINICAL DATA:  Shortness of breath EXAM: PORTABLE CHEST - 1 VIEW COMPARISON:  11/11/2018 FINDINGS: Unchanged mild cardiomegaly and pulmonary vascular congestion. Interval worsening of left basilar opacity likely due to atelectasis. Lungs otherwise clear. Examination is limited due to expiratory phase of imaging. Interval placement of temporary right IJ hemodialysis catheter which terminates in the region of the right atrium. Nasogastric tube terminates in the left upper quadrant. IMPRESSION: Interval worsening of left basilar opacity which is likely due to atelectasis. Electronically Signed   By: Acquanetta Belling M.D.   On: 11/03/2023 07:22   IR Fluoro Guide CV Line Right Result Date: 11/01/2023 INDICATION: ESRD requiring HD. EXAM: NON-TUNNELED CENTRAL VENOUS HEMODIALYSIS CATHETER PLACEMENT WITH ULTRASOUND AND FLUOROSCOPIC GUIDANCE COMPARISON:  CT AP 10/31/2023. MEDICATIONS: Local anesthetic was administered. FLUOROSCOPY TIME:  Fluoroscopic dose; 35 mGy COMPLICATIONS: None immediate. PROCEDURE: Informed written consent was obtained from the patient and/or patient's representative after a discussion of the risks, benefits, and alternatives to treatment. Questions regarding the procedure were encouraged and answered. The RIGHT neck and chest were prepped with chlorhexidine in a sterile fashion, and a sterile drape was applied covering the operative field. Maximum barrier sterile technique with sterile gowns and gloves were used for the procedure. A timeout was  performed prior to the initiation of the procedure. After the overlying soft tissues were anesthetized, a small venotomy incision was created and a micropuncture kit was utilized to access the external jugular vein. Real-time ultrasound guidance was utilized for vascular access including the acquisition of a permanent ultrasound image documenting patency of the accessed vessel. The microwire was utilized to measure appropriate catheter length. A stiff glidewire was advanced to the level of the IVC. Under fluoroscopic guidance, the venotomy was serially dilated, ultimately allowing placement of a 20 cm temporary Trialysis catheter with tip ultimately terminating within the superior aspect of the right atrium. Final catheter positioning was confirmed and documented with a spot radiographic image. The catheter aspirates and  flushes normally. The catheter was flushed with appropriate volume heparin dwells. The catheter exit site was secured with a 2-0 Ethilon retention suture. A dressing was placed. The patient tolerated the procedure well without immediate post procedural complication. IMPRESSION: Successful placement of a RIGHT EXTERNAL jugular approach 20 cm non-tunneled dialysis catheter The tip of the catheter is positioned within the proximal RIGHT atrium. The catheter is ready for immediate use. PLAN: This catheter may be converted to a tunneled dialysis catheter at a later date as indicated. Roanna Banning, MD [facility] Radiology Electronically Signed   By: Roanna Banning M.D.   On: 11/01/2023 18:15   IR US Guide Vasc Access Right Result Date: 11/01/2023 INDICATION: ESRD requiring HD. EXAM: NON-TUNNELED CENTRAL VENOUS HEMODIALYSIS CATHETER PLACEMENT WITH ULTRASOUND AND FLUOROSCOPIC GUIDANCE COMPARISON:  CT AP 10/31/2023. MEDICATIONS: Local anesthetic was administered. FLUOROSCOPY TIME:  Fluoroscopic dose; 35 mGy COMPLICATIONS: None immediate. PROCEDURE: Informed  written consent was obtained from the patient and/or patient's representative after a discussion of the risks, benefits, and alternatives to treatment. Questions regarding the procedure were encouraged and answered. The RIGHT neck and chest were prepped with chlorhexidine in a sterile fashion, and a sterile drape was applied covering the operative field. Maximum barrier sterile technique with sterile gowns and gloves were used for the procedure. A timeout was performed prior to the initiation of the procedure. After the overlying soft tissues were anesthetized, a small venotomy incision was created and a micropuncture kit was utilized to access the external jugular vein. Real-time ultrasound guidance was utilized for vascular access including the acquisition of a permanent ultrasound image documenting patency of the accessed vessel. The microwire was utilized to measure appropriate catheter length. A stiff glidewire was advanced to the level of the IVC. Under fluoroscopic guidance, the venotomy was serially dilated, ultimately allowing placement of a 20 cm temporary Trialysis catheter with tip ultimately terminating within the superior aspect of the right atrium. Final catheter positioning was confirmed and documented with a spot radiographic image. The catheter aspirates and flushes normally. The catheter was flushed with appropriate volume heparin dwells. The catheter exit site was secured with a 2-0 Ethilon retention suture. A dressing was placed. The patient tolerated the procedure well without immediate post procedural complication. IMPRESSION: Successful placement of a RIGHT EXTERNAL jugular approach 20 cm non-tunneled dialysis catheter The tip of the catheter is positioned within the proximal RIGHT atrium. The catheter is ready for immediate use. PLAN: This catheter may be converted to a tunneled dialysis catheter at a later date as indicated. Roanna Banning, MD [facility]  [facility] Radiology Electronically Signed   By: Roanna Banning M.D.   On: 11/01/2023 18:15   CT ABDOMEN PELVIS WO CONTRAST Result Date: 10/31/2023 CLINICAL DATA:  Diverticulitis, complication suspected. EXAM: CT ABDOMEN AND PELVIS WITHOUT CONTRAST TECHNIQUE: Multidetector CT imaging of the abdomen and pelvis was performed following the standard protocol without IV contrast. RADIATION DOSE REDUCTION: This exam was performed according to the departmental dose-optimization program which includes automated exposure control, adjustment of the mA and/or kV according to patient size and/or use of iterative reconstruction technique. COMPARISON:  10/27/2023. FINDINGS: Lower chest: Heart is enlarged and multi-vessel coronary artery calcifications are noted. Consolidation and patchy airspace disease is present at the lung bases bilaterally. Hepatobiliary: No focal liver abnormality is seen. Stones are present within the gallbladder. No biliary ductal dilatation. Pancreas: Unremarkable. No pancreatic ductal dilatation or surrounding inflammatory changes. Spleen: Normal in size without  focal abnormality. Adrenals/Urinary Tract: The adrenal glands are within normal limits. Calcifications are present in the upper pole of the left kidney. There is a cyst in the lower pole of the right kidney. No hydronephrosis bilaterally. The bladder is unremarkable. Stomach/Bowel: The stomach is within normal limits. No bowel obstruction or pneumatosis is seen. Right hemicolectomy changes are noted. There is diffuse colonic wall thickening. Scattered diverticula are present along the colon. A few small foci of free air are noted in the mesentery in the mid left abdomen, slightly increased from the previous exam. There is suggestion of contrast extravasation in the region. There is fine no definite abscess is seen. Vascular/Lymphatic: Aortic atherosclerosis. No enlarged abdominal or pelvic lymph nodes. Reproductive: No acute abnormality. Other:  Mild ascites is noted in all 4 quadrants. A fat containing umbilical hernia is noted. There is mild anasarca. Musculoskeletal: The bony structures are stable. IMPRESSION: 1. Worsening diverticulitis of the descending colon with perforation and increasing free air and suspected oral contrast extravasation in the mid left abdomen. No definite abscess is seen. Surgical consultation is recommended. 2. Mild ascites. 3. Diffuse colonic wall thickening which may be due to associated inflammatory changes. 4. Patchy airspace disease at the lung bases with bilateral lower lobe consolidation. 5. Cholelithiasis. 6. Aortic atherosclerosis. Critical Value/emergent results were called by telephone at the time of interpretation on 10/31/2023 at 11:59 pm to provider Dr. Antionette Char, who verbally acknowledged these results. Electronically Signed   By: Thornell Sartorius M.D.   On: 10/31/2023 23:59   CT ABDOMEN PELVIS WO CONTRAST Result Date: 10/27/2023 CLINICAL DATA:  Acute nonlocalized abdominal pain. Chills and fever. Vomiting and diarrhea. EXAM: CT ABDOMEN AND PELVIS WITHOUT CONTRAST TECHNIQUE: Multidetector CT imaging of the abdomen and pelvis was performed following the standard protocol without IV contrast. RADIATION DOSE REDUCTION: This exam was performed according to the departmental dose-optimization program which includes automated exposure control, adjustment of the mA and/or kV according to patient size and/or use of iterative reconstruction technique. COMPARISON:  CT abdomen and pelvis 04/19/2021 FINDINGS: Lower chest: Bibasilar atelectasis.  No acute abnormality. Hepatobiliary: Cholelithiasis without evidence of acute cholecystitis. No biliary dilation. Unremarkable noncontrast appearance of the liver. Pancreas: Unremarkable. Spleen: Unremarkable. Adrenals/Urinary Tract: Normal adrenal glands. No urinary calculi or hydronephrosis. Unremarkable bladder. Stomach/Bowel: Stomach is within normal limits. No bowel obstruction. Colonic  diverticulosis. Wall thickening, inflammatory stranding, and free fluid about the descending colon. Loosely organized fluid and gas about the inflamed descending colon (circa series 3/image 49-50) compatible with contained perforation. The adjacent jejunum is inflamed. Postoperative change about the colon with ileocolonic anastomosis in the right upper quadrant. Vascular/Lymphatic: Aortic atherosclerosis. No enlarged abdominal or pelvic lymph nodes. Reproductive: No acute abnormality. Other: Fat containing periumbilical hernia. Musculoskeletal: No acute fracture. IMPRESSION: 1. Acute diverticulitis of the descending colon with contained perforation. 2. Inflamed jejunum adjacent to the perforated diverticulitis. 3.  Aortic Atherosclerosis (ICD10-I70.0). Critical Value/emergent results were called by telephone at the time of interpretation on 10/27/2023 at 9:22 pm to provider [facility] , who verbally acknowledged these results. Electronically Signed   By: Minerva Fester M.D.   On: 10/27/2023 21:24    Signature  -   Huey Bienenstock M.D on 11/14/2023 at 2:59 PM   -  To page go to [email or web address]

## 2023-11-14 NOTE — Progress Notes (Signed)
Progress Note  12 Days Post-Op  Subjective: Looks much better today.  Eating better.  No further bleeding from wound since yesterday.  Did receive 1 unit of pRBCs yesterday.  Objective: Vital signs in last 24 hours: Temp:  [97.9 F (36.6 C)-98.3 F (36.8 C)] 97.9 F (36.6 C) (02/20 0500) Pulse Rate:  [73-81] 77 (02/19 1751) Resp:  [14-22] 18 (02/20 0100) BP: (136-185)/(55-101) 180/75 (02/20 0500) SpO2:  [93 %-100 %] 100 % (02/19 1751) Weight:  [94.1 kg] 94.1 kg (02/19 1200) Last BM Date : 11/12/23  Intake/Output from previous day: 02/19 0701 - 02/20 0700 In: 1155 [P.O.:840; Blood:315] Out: 3000 [Urine:500; Stool:500] Intake/Output this shift: No intake/output data recorded.  PE: Abd: soft, appropriately ttp, midline wound clean with no bleeding so far today, ostomy functioning with stool in bag   Lab Results:  Recent Labs    11/13/23 0052 11/14/23 0354  WBC 18.8* 15.8*  HGB 7.6* 8.2*  HCT 23.9* 26.0*  PLT 326 306   BMET Recent Labs    11/13/23 0052 11/14/23 0354  NA 136 135  K 4.0 3.7  CL 100 97*  CO2 24 26  GLUCOSE 128* 152*  BUN 39* 25*  CREATININE 7.04* 5.39*  CALCIUM 7.6* 7.7*   PT/INR No results for input(s): "LABPROT", "INR" in the last 72 hours. CMP     Component Value Date/Time   NA 135 11/14/2023 0354   NA 137 02/28/2021 1548   K 3.7 11/14/2023 0354   CL 97 (L) 11/14/2023 0354   CO2 26 11/14/2023 0354   GLUCOSE 152 (H) 11/14/2023 0354   BUN 25 (H) 11/14/2023 0354   BUN 33 (H) 02/28/2021 1548   CREATININE 5.39 (H) 11/14/2023 0354   CREATININE 3.38 (H) 12/26/2015 1347   CALCIUM 7.7 (L) 11/14/2023 0354   PROT 5.8 (L) 11/11/2023 0327   PROT 7.0 11/07/2017 1414   ALBUMIN 1.7 (L) 11/11/2023 0327   ALBUMIN 4.0 11/07/2017 1414   AST 23 11/11/2023 0327   ALT 17 11/11/2023 0327   ALKPHOS 39 11/11/2023 0327   BILITOT 0.5 11/11/2023 0327   BILITOT 0.2 11/07/2017 1414   GFRNONAA 8 (L) 11/14/2023 0354   GFRNONAA 13 (L) 01/10/2015 1428    GFRAA 21 (L) 12/21/2019 1802   GFRAA 16 (L) 01/10/2015 1428   Lipase     Component Value Date/Time   LIPASE 26 10/27/2023 1936       Studies/Results: No results found.   Anti-infectives: Anti-infectives (From admission, onward)    Start     Dose/Rate Route Frequency Ordered Stop   11/08/23 1200  erythromycin (EES) 400 MG/5ML suspension 400 mg  Status:  Discontinued        400 mg Oral Every 6 hours 11/08/23 0815 11/12/23 1111   11/06/23 0815  piperacillin-tazobactam (ZOSYN) IVPB 2.25 g        2.25 g 100 mL/hr over 30 Minutes Intravenous Every 8 hours 11/06/23 0729     11/01/23 1415  piperacillin-tazobactam (ZOSYN) IVPB 2.25 g        2.25 g 100 mL/hr over 30 Minutes Intravenous Every 8 hours 11/01/23 1316 11/06/23 0602   10/29/23 1100  piperacillin-tazobactam (ZOSYN) IVPB 2.25 g  Status:  Discontinued        2.25 g 100 mL/hr over 30 Minutes Intravenous Every 8 hours 10/29/23 1006 11/01/23 1016   10/28/23 0600  piperacillin-tazobactam (ZOSYN) IVPB 2.25 g  Status:  Discontinued        2.25 g 100 mL/hr over  30 Minutes Intravenous Every 8 hours 10/27/23 2224 10/27/23 2235   10/28/23 0600  ceFEPIme (MAXIPIME) 1 g in sodium chloride 0.9 % 100 mL IVPB  Status:  Discontinued        1 g 200 mL/hr over 30 Minutes Intravenous Every 24 hours 10/27/23 2235 10/29/23 0954   10/28/23 0600  metroNIDAZOLE (FLAGYL) IVPB 500 mg  Status:  Discontinued        500 mg 100 mL/hr over 60 Minutes Intravenous Every 12 hours 10/27/23 2235 10/29/23 0954   10/27/23 2130  piperacillin-tazobactam (ZOSYN) IVPB 3.375 g        3.375 g 100 mL/hr over 30 Minutes Intravenous  Once 10/27/23 2128 10/27/23 2320        Assessment/Plan  POD 12, s/p Hartmann's by Dr. Freida Busman 11/02/23 forSigmoid diverticulitis with perforation  - WBC down to 15K.  Abx can be stopped at this time given she has had 16 days thus far and she is AF and looks well. -hgb 8.2 today after 1 unit pRBCs yesterday.  -has been on Eliquis for 24  hrs with no bleeding from her wound.  Will replace her wound VAC today and see how she does. - renal diet and tolerating - therapies for mobilization, rec CIR - VAC to midline wound, M/Th sched.  - WOC for colostomy care  -patient is approaching surgical stability.  She would greatly benefit from CIR over SNF placement due to her multiple medical needs, HD, anemia, bleeding from abdominal wound on anticoagulation.  It would be more beneficial to the patient to be in CIR so if she were to develop a bleeding complication from her wound on Eliquis, surgical services are more readily available.  If she were to go to SNF, she would be high risk for readmission due to possible complications as noted above.     FEN: renal diet, protein supplements VTE: Eliquis ID: cefepime/flagyl 2/3>2/4; Zosyn 2/4>>2/20   - per TR[facility] -  PAF  HTN HLD AKI on CKD stage V - on HD CAD T2DM   LOS: 18 days     Letha Cape, [facility] 11/14/2023, 9:48 AM Please see Amion for pager number during day hours 7:00am-4:30pm

## 2023-11-14 NOTE — Consult Note (Signed)
WOC Nurse wound follow up Wound type:midline surgical  Measurement: 17.5 cm x 6 cm x 5 cm  Wound bed: beefy red Drainage (amount, consistency, odor) bleeding has been controlled  will initiate NPWT again Periwound: intact Dressing procedure/placement/frequency: Barrier ring to perimeter to promote seal  2 pieces black foam. COvered with drape.  Change MOnday and THursday.  Will follow. Mike Gip MSN, RN, FNP-BC CWON Wound, Ostomy, Continence Nurse [facility] [phone] Pager [phone]

## 2023-11-14 NOTE — Progress Notes (Signed)
Inpatient Rehab Admissions Coordinator:   Faxed notes for expedited appeal.  Will follow.   Estill Dooms, PT, DPT Admissions Coordinator [phone] 11/14/23  10:33 AM

## 2023-11-14 NOTE — Plan of Care (Signed)
Pt has rested quietly throughout the night with no distress noted. Alert and oriented. On O22LNC. Tunnel cath intact with dressing CDI. CVC intact with dressing CDI. Colostomy intact with small amount liquid green/brown stool. Up to [facility] to void. Abdominal dressing changed as ordered. Medicated for pain at bedtime with relief noted. No other complaints voiced.     Problem: Activity: Goal: Risk for activity intolerance will decrease Outcome: Progressing   Problem: Coping: Goal: Level of anxiety will decrease Outcome: Progressing   Problem: Elimination: Goal: Will not experience complications related to bowel motility Outcome: Progressing   Problem: Pain Managment: Goal: General experience of comfort will improve and/or be controlled Outcome: Progressing   Problem: Role Relationship: Goal: Ability to verbalize concerns, feelings, and thoughts to partner or family member will improve Outcome: Progressing   Problem: Education: Goal: Knowledge of disease and its progression will improve Outcome: Progressing   Problem: Health Behavior/Discharge Planning: Goal: Ability to manage health-related needs will improve Outcome: Progressing   Problem: Clinical Measurements: Goal: Complications related to the disease process, condition or treatment will be avoided or minimized Outcome: Progressing

## 2023-11-14 NOTE — Progress Notes (Signed)
Pt's referral with [facility] admissions is currently still pending. Awaiting final approval and schedule. Will assist as needed.   Olivia Canter Renal Navigator [phone]

## 2023-11-14 NOTE — Progress Notes (Shared)
Attn: Expedited APPEAL for AIR/IRF Admissions  To: [facility] Medicare CM for EXPEDITED APPEALS  Fax Number: [phone]  From: Dawnita Molner, [birth year], [employer] ID [id number], [address] [place], [place] [address], [phone]    We are requesting an expedited appeal for the denial of admission to [facility] inpatient rehab facility.  Your reasoning for denial that this patient's needs can be met in a lower level of care is an inappropriate standard to use when determining whether to grant prior authorization because it goes beyond the current criteria established under traditional Medicare, and it is more restrictive.  CMS guidelines states that "inpatient rehabilitation coverage is conclusively established if the patient meets the requirements set forth in the regulation.  The regulation does not impose a "less intensive setting" standard.  In denying a prior auth request on this basis, an MA plan unlawfully applies coverage criteria that goes beyond the coverage policies applied to traditional Medicare beneficiaries."  Please reconsider your denial and approve inpatient rehabilitation as this patient does meet criteria for acute inpatient rehabilitation.  This has been documented by her attending physician as well as by the PMR physician.  All agree that she has significant PMR needs as demonstrated by her prolonged and complicated hospital course with an open midline wound, new ostomy, and new HD needs.  She requires close physician monitoring of the above in addition to her pAFIB with transition to Eliquis, HTN, CAD, and DM with neuropathy.  She will benefit from a team approach with all 3 therapy disciplines, nursing, physician, and social work to ensure a safe discharge home with decreased readmission risk.    AOR and supporting documentation to be faxed separately.  Please call the case manager, Estill Dooms, [phone], at the hospital with your response, or any questions.  Fax is  [phone].

## 2023-11-15 DIAGNOSIS — K5732 Diverticulitis of large intestine without perforation or abscess without bleeding: Secondary | ICD-10-CM | POA: Diagnosis not present

## 2023-11-15 LAB — CBC
HCT: 26.7 % — ABNORMAL LOW (ref 36.0–46.0)
Hemoglobin: 8.6 g/dL — ABNORMAL LOW (ref 12.0–15.0)
MCH: 27.1 pg (ref 26.0–34.0)
MCHC: 32.2 g/dL (ref 30.0–36.0)
MCV: 84.2 fL (ref 80.0–100.0)
Platelets: 325 10*3/uL (ref 150–400)
RBC: 3.17 MIL/uL — ABNORMAL LOW (ref 3.87–5.11)
RDW: 16.3 % — ABNORMAL HIGH (ref 11.5–15.5)
WBC: 13.5 10*3/uL — ABNORMAL HIGH (ref 4.0–10.5)
nRBC: 0 % (ref 0.0–0.2)

## 2023-11-15 LAB — GLUCOSE, CAPILLARY
Glucose-Capillary: 86 mg/dL (ref 70–99)
Glucose-Capillary: 104 mg/dL — ABNORMAL HIGH (ref 70–99)
Glucose-Capillary: 159 mg/dL — ABNORMAL HIGH (ref 70–99)

## 2023-11-15 LAB — RENAL FUNCTION PANEL
Albumin: 1.9 g/dL — ABNORMAL LOW (ref 3.5–5.0)
Anion gap: 13 (ref 5–15)
BUN: 30 mg/dL — ABNORMAL HIGH (ref 8–23)
CO2: 24 mmol/L (ref 22–32)
Calcium: 7.8 mg/dL — ABNORMAL LOW (ref 8.9–10.3)
Chloride: 99 mmol/L (ref 98–111)
Creatinine, Ser: 7.2 mg/dL — ABNORMAL HIGH (ref 0.44–1.00)
GFR, Estimated: 6 mL/min — ABNORMAL LOW (ref >60)
Glucose, Bld: 96 mg/dL (ref 70–99)
Phosphorus: 4.9 mg/dL — ABNORMAL HIGH (ref 2.5–4.6)
Potassium: 3.5 mmol/L (ref 3.5–5.1)
Sodium: 136 mmol/L (ref 135–145)

## 2023-11-15 MED ORDER — ALTEPLASE 2 MG IJ SOLR
2.0000 mg | Freq: Once | INTRAMUSCULAR | Status: AC
  Administered 2023-11-15: 2 mg

## 2023-11-15 MED ORDER — ALTEPLASE 2 MG IJ SOLR
INTRAMUSCULAR | Status: AC
  Filled 2023-11-15: qty 2

## 2023-11-15 MED ORDER — ALTEPLASE 2 MG IJ SOLR
2.0000 mg | Freq: Once | INTRAMUSCULAR | Status: AC | PRN
  Administered 2023-11-15: 2 mg

## 2023-11-15 MED ORDER — PANTOPRAZOLE SODIUM 40 MG PO TBEC
40.0000 mg | DELAYED_RELEASE_TABLET | Freq: Two times a day (BID) | ORAL | Status: DC
  Administered 2023-11-15 – 2023-11-25 (×19): 40 mg via ORAL
  Filled 2023-11-15 (×19): qty 1

## 2023-11-15 MED ORDER — HEPARIN SODIUM (PORCINE) 1000 UNIT/ML IJ SOLN
3200.0000 [IU] | Freq: Once | INTRAMUSCULAR | Status: AC
  Administered 2023-11-15: 3200 [IU] via INTRAVENOUS
  Filled 2023-11-15: qty 4

## 2023-11-15 NOTE — TOC Progression Note (Signed)
Transition of Care [facility]) - Progression Note    Patient Details  Name: [name] MRN: [id number] Date of Birth: [birth year]  Transition of Care [facility]) CM/SW Contact  Mearl Latin, LCSW Phone Number: 11/15/2023, 3:08 PM  Clinical Narrative:    Continuing to await CIR appeal results.    Expected Discharge Plan: IP Rehab Facility Barriers to Discharge: Continued Medical Work up, Conservator, museum/gallery and Services In-house Referral: Clinical Social Work   Post Acute Care Choice: IP Rehab Living arrangements for the past 2 months: Single Family Home                                       Social Determinants of Health (SDOH) Interventions SDOH Screenings   Food Insecurity: No Food Insecurity (10/28/2023)  Housing: Low Risk  (10/28/2023)  Transportation Needs: No Transportation Needs (10/28/2023)  Utilities: Not At Risk (10/28/2023)  Depression (PHQ2-9): Low Risk  (02/28/2021)  Financial Resource Strain: Low Risk  (08/25/2021)   Received from [facility], [facility]  Physical Activity: Insufficiently Active (06/20/2021)   Received from [facility], [facility]  Social Connections: Socially Isolated (10/28/2023)  Stress: No Stress Concern Present (08/25/2021)   Received from [facility], [facility]  Tobacco Use: Low Risk  (11/02/2023)    Readmission Risk Interventions     No data to display

## 2023-11-15 NOTE — Progress Notes (Signed)
OT Cancellation Note  Patient Details Name: [name] MRN: [id number] DOB: [birth year]   Cancelled Treatment:    Reason Eval/Treat Not Completed: Patient at procedure or test/ unavailable (HD - OT treat to f/u later today as appropriate.)  Donia Pounds 11/15/2023, 8:01 AM

## 2023-11-15 NOTE — Procedures (Signed)
Received patient in bed to unit.  Alert and oriented.  Informed consent signed and in chart.   TX duration: 3 hours 15 min  Patient tolerated well.  Transported back to the room  Alert, without acute distress.  Hand-off given to patient's nurse.   Access used: right cath Access issues: none  Total UF removed: 3 liters   Lu Duffel, RN Kidney Dialysis Unit

## 2023-11-15 NOTE — Plan of Care (Signed)
Pt has rested quietly throughout the night with no distress noted. Alert and oriented. [id number]. SR on the monitor. Colostomy intact. Wound vac intact and functioning correctly. Pt medicated for pain once at bedtime with relief noted. No complaints voiced.     Problem: Activity: Goal: Risk for activity intolerance will decrease Outcome: Progressing   Problem: Pain Managment: Goal: General experience of comfort will improve and/or be controlled Outcome: Progressing   Problem: Role Relationship: Goal: Ability to verbalize concerns, feelings, and thoughts to partner or family member will improve Outcome: Progressing   Problem: Pain Management: Goal: Satisfaction with pain management regimen will improve Outcome: Progressing   Problem: Education: Goal: Knowledge of disease and its progression will improve Outcome: Progressing   Problem: Fluid Volume: Goal: Compliance with measures to maintain balanced fluid volume will improve Outcome: Progressing

## 2023-11-15 NOTE — Progress Notes (Signed)
Patient machine alarming, power flushed both ports and switched lines without success. BFR decreased to 250, machine continues to alarm. Spoke with Dr. Valentino Nose, said okay to give TPA. Rinsed patient back and gathered new supplies.

## 2023-11-15 NOTE — Progress Notes (Signed)
Progress Note  13 Days Post-Op  Subjective: No new complaints today.  In HD.  VAC had a seal leak  Objective: Vital signs in last 24 hours: Temp:  [97.8 F (36.6 C)-98.1 F (36.7 C)] 97.9 F (36.6 C) (02/21 0759) Pulse Rate:  [70-76] 75 (02/21 1129) Resp:  [11-19] 17 (02/21 1129) BP: (108-175)/(56-81) 165/74 (02/21 1129) SpO2:  [96 %-100 %] 99 % (02/21 1129) Weight:  [93.4 kg] 93.4 kg (02/21 0759) Last BM Date : 11/14/23  Intake/Output from previous day: 02/20 0701 - 02/21 0700 In: 240 [P.O.:240] Out: 350 [Urine:350] Intake/Output this shift: No intake/output data recorded.  PE: Abd: soft, appropriately ttp, midline wound with VAC in place.  Got VAC to seal.  Colostomy with some stool starting to produce through os.    Lab Results:  Recent Labs    11/14/23 0354 11/15/23 0806  WBC 15.8* 13.5*  HGB 8.2* 8.6*  HCT 26.0* 26.7*  PLT 306 325   BMET Recent Labs    11/14/23 0354 11/15/23 0806  NA 135 136  K 3.7 3.5  CL 97* 99  CO2 26 24  GLUCOSE 152* 96  BUN 25* 30*  CREATININE 5.39* 7.20*  CALCIUM 7.7* 7.8*   PT/INR No results for input(s): "LABPROT", "INR" in the last 72 hours. CMP     Component Value Date/Time   NA 136 11/15/2023 0806   NA 137 02/28/2021 1548   K 3.5 11/15/2023 0806   CL 99 11/15/2023 0806   CO2 24 11/15/2023 0806   GLUCOSE 96 11/15/2023 0806   BUN 30 (H) 11/15/2023 0806   BUN 33 (H) 02/28/2021 1548   CREATININE 7.20 (H) 11/15/2023 0806   CREATININE 3.38 (H) 12/26/2015 1347   CALCIUM 7.8 (L) 11/15/2023 0806   PROT 5.8 (L) 11/11/2023 0327   PROT 7.0 11/07/2017 1414   ALBUMIN 1.9 (L) 11/15/2023 0806   ALBUMIN 4.0 11/07/2017 1414   AST 23 11/11/2023 0327   ALT 17 11/11/2023 0327   ALKPHOS 39 11/11/2023 0327   BILITOT 0.5 11/11/2023 0327   BILITOT 0.2 11/07/2017 1414   GFRNONAA 6 (L) 11/15/2023 0806   GFRNONAA 13 (L) 01/10/2015 1428   GFRAA 21 (L) 12/21/2019 1802   GFRAA 16 (L) 01/10/2015 1428   Lipase     Component  Value Date/Time   LIPASE 26 10/27/2023 1936       Studies/Results: No results found.   Anti-infectives: Anti-infectives (From admission, onward)    Start     Dose/Rate Route Frequency Ordered Stop   11/08/23 1200  erythromycin (EES) 400 MG/5ML suspension 400 mg  Status:  Discontinued        400 mg Oral Every 6 hours 11/08/23 0815 11/12/23 1111   11/06/23 0815  piperacillin-tazobactam (ZOSYN) IVPB 2.25 g  Status:  Discontinued        2.25 g 100 mL/hr over 30 Minutes Intravenous Every 8 hours 11/06/23 0729 11/14/23 0951   11/01/23 1415  piperacillin-tazobactam (ZOSYN) IVPB 2.25 g        2.25 g 100 mL/hr over 30 Minutes Intravenous Every 8 hours 11/01/23 1316 11/06/23 0602   10/29/23 1100  piperacillin-tazobactam (ZOSYN) IVPB 2.25 g  Status:  Discontinued        2.25 g 100 mL/hr over 30 Minutes Intravenous Every 8 hours 10/29/23 1006 11/01/23 1016   10/28/23 0600  piperacillin-tazobactam (ZOSYN) IVPB 2.25 g  Status:  Discontinued        2.25 g 100 mL/hr over 30 Minutes Intravenous  Every 8 hours 10/27/23 2224 10/27/23 2235   10/28/23 0600  ceFEPIme (MAXIPIME) 1 g in sodium chloride 0.9 % 100 mL IVPB  Status:  Discontinued        1 g 200 mL/hr over 30 Minutes Intravenous Every 24 hours 10/27/23 2235 10/29/23 0954   10/28/23 0600  metroNIDAZOLE (FLAGYL) IVPB 500 mg  Status:  Discontinued        500 mg 100 mL/hr over 60 Minutes Intravenous Every 12 hours 10/27/23 2235 10/29/23 0954   10/27/23 2130  piperacillin-tazobactam (ZOSYN) IVPB 3.375 g        3.375 g 100 mL/hr over 30 Minutes Intravenous  Once 10/27/23 2128 10/27/23 2320        Assessment/Plan  POD 13, s/p Hartmann's by Dr. Freida Busman 11/02/23 forSigmoid diverticulitis with perforation  - WBC down to 13K.  Off abx -hgb 8.6 - VAC in place with no evidence of bleeding. - renal diet and tolerating - therapies for mobilization, rec CIR - VAC to midline wound, M/Th sched.  - WOC for colostomy care  -patient is approaching  surgical stability.  She would greatly benefit from CIR over SNF placement due to her multiple medical needs, HD, anemia, bleeding from abdominal wound on anticoagulation.  It would be more beneficial to the patient to be in CIR so if she were to develop a bleeding complication from her wound on Eliquis, surgical services are more readily available.  If she were to go to SNF, she would be high risk for readmission due to possible complications as noted above.  -patient is surgically stable for DC.  Will be a PRN for the WE and check back on Monday     FEN: renal diet, protein supplements VTE: Eliquis ID: cefepime/flagyl 2/3>2/4; Zosyn 2/4>>2/20   - per TRH -  PAF  HTN HLD AKI on CKD stage V - on HD CAD T2DM   LOS: 19 days     Letha Cape, [facility] Surgery 11/15/2023, 11:47 AM Please see Amion for pager number during day hours 7:00am-4:30pm

## 2023-11-15 NOTE — Progress Notes (Signed)
Inpatient Rehab Admissions Coordinator:   Expedited appeal pending.    Estill Dooms, PT, DPT Admissions Coordinator [phone] 11/15/23  11:10 AM

## 2023-11-15 NOTE — Progress Notes (Signed)
Contacted [facility] to confirm pt's schedule prior to providing to pt/staff, Will await return call from clinic staff for confirmation. Will assist as needed.   Olivia Canter Renal Navigator [phone]

## 2023-11-15 NOTE — Progress Notes (Signed)
Patient ID: [name], female   DOB: [birth year], 68 y.o.   MRN: [id number] [facility] Progress Note   Assessment/ Plan:   1.  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  After initial refusal of wanting to pursue renal replacement therapy, she changed her mind and asked to start dialysis with the intention of undergoing surgery for contained perforation of acute diverticulitis.   -Maintain dialysis on MWF schedule -TDC w/ IR on 2/18, appreciate help -Some low flows on catheter today.  Use tPA -Palliative care has been involved and patient wishes to continue with dialysis for now.   -CLIP in process; likely [facility].  Probably going to CIR. -Would hold on permanent access creation for now given overall clinical picture 2.  Acute diverticulitis with contained perforation of descending colon: On Zosyn and now status post exploratory laparotomy with Hartman's resection for perforated diverticulitis/peritonitis (2/8).  Management per surgery and primary team 3.  Anion gap metabolic acidosis: Resolved with renal replacement therapy 4.  Anemia: Has received blood and IV iron. ESA also ordered 5. HTN: BP up and down, increase UF with HD, cont current meds.  Consider intensifying blood pressure regimen tomorrow 6. Dispo: eval for CIR.  Subjective:    Patient feels well today with no complaints.  Seen on dialysis.   Objective:   BP (!) 181/84   Pulse 74   Temp 97.9 F (36.6 C)   Resp 14   Ht 5' 7.01" (1.702 m)   Wt 93.4 kg   SpO2 100%   BMI 32.24 kg/m   Intake/Output Summary (Last 24 hours) at 11/15/2023 1229 Last data filed at 11/15/2023 0600 Gross per 24 hour  Intake 240 ml  Output 350 ml  Net -110 ml   Weight change:   Physical Exam: Gen: Lying in bed in no distress CVS: Normal rate, no rub Resp: Bilateral chest  rise with no increased work of breathing Abd: Minimal distention, colostomy bag in place Ext: Trace ankle edema.  Warm and well-perfused  Imaging: No results found.     Labs: BMET Recent Labs  Lab 11/09/23 0958 11/10/23 0139 11/11/23 0327 11/13/23 0052 11/14/23 0354 11/15/23 0806  NA 133* 137 137 136 135 136  K 3.4* 3.4* 3.7 4.0 3.7 3.5  CL 95* 99 102 100 97* 99  CO2 25 24 22 24 26 24   GLUCOSE 358* 122* 161* 128* 152* 96  BUN 38* 45* 55* 39* 25* 30*  CREATININE 5.43* 6.44* 7.89* 7.04* 5.39* 7.20*  CALCIUM 7.3* 7.3* 7.5* 7.6* 7.7* 7.8*  PHOS 2.6 2.3* 2.2*  --   --  4.9*   CBC Recent Labs  Lab 11/09/23 0958 11/10/23 0139 11/11/23 0327 11/12/23 0420 11/13/23 0052 11/14/23 0354 11/15/23 0806  WBC 24.7* 21.5* 22.5* 22.0* 18.8* 15.8* 13.5*  NEUTROABS 22.0* 15.9* 18.9*  --   --   --   --   HGB 8.7* 7.9* 8.0* 8.0* 7.6* 8.2* 8.6*  HCT 26.8* 24.5* 24.8* 24.9* 23.9* 26.0* 26.7*  MCV 81.7 82.8 83.2 83.0 83.9 84.1 84.2  PLT 300 300 331 326 326 306 325    Medications:     alteplase       amLODipine  5 mg Oral Daily   apixaban  5 mg Oral BID   carvedilol  6.25 mg Oral BID WC   Chlorhexidine Gluconate Cloth  6 each Topical Q0600  darbepoetin (ARANESP) injection - DIALYSIS  60 mcg Subcutaneous Q Mon-1800   heparin sodium (porcine)  3,200 Units Intravenous Once   insulin aspart  0-15 Units Subcutaneous TID WC   insulin aspart  0-5 Units Subcutaneous QHS   insulin glargine-yfgn  12 Units Subcutaneous Daily   mouth rinse  15 mL Mouth Rinse 4 times per day   pantoprazole (PROTONIX) IV  40 mg Intravenous Q12H   sodium chloride flush  10-40 mL Intracatheter Q12H   Darnell Level  11/15/2023, 12:29 PM

## 2023-11-15 NOTE — Procedures (Signed)
I was present at this dialysis session. I have reviewed the session itself and made appropriate changes.  Some low blood flows today.  Plan for tPA.  Continue to monitor  [facility] Weights   11/13/23 0818 11/13/23 1200 11/15/23 0759  Weight: 96.1 kg 94.1 kg 93.4 kg    Recent Labs  Lab 11/15/23 0806  NA 136  K 3.5  CL 99  CO2 24  GLUCOSE 96  BUN 30*  CREATININE 7.20*  CALCIUM 7.8*  PHOS 4.9*    Recent Labs  Lab 11/09/23 0958 11/10/23 0139 11/11/23 0327 11/12/23 0420 11/13/23 0052 11/14/23 0354 11/15/23 0806  WBC 24.7* 21.5* 22.5*   < > 18.8* 15.8* 13.5*  NEUTROABS 22.0* 15.9* 18.9*  --   --   --   --   HGB 8.7* 7.9* 8.0*   < > 7.6* 8.2* 8.6*  HCT 26.8* 24.5* 24.8*   < > 23.9* 26.0* 26.7*  MCV 81.7 82.8 83.2   < > 83.9 84.1 84.2  PLT 300 300 331   < > 326 306 325   < > = values in this interval not displayed.    Scheduled Meds:  alteplase       amLODipine  5 mg Oral Daily   apixaban  5 mg Oral BID   carvedilol  6.25 mg Oral BID WC   Chlorhexidine Gluconate Cloth  6 each Topical Q0600   darbepoetin (ARANESP) injection - DIALYSIS  60 mcg Subcutaneous Q Mon-1800   heparin sodium (porcine)  3,200 Units Intravenous Once   insulin aspart  0-15 Units Subcutaneous TID WC   insulin aspart  0-5 Units Subcutaneous QHS   insulin glargine-yfgn  12 Units Subcutaneous Daily   mouth rinse  15 mL Mouth Rinse 4 times per day   pantoprazole (PROTONIX) IV  40 mg Intravenous Q12H   sodium chloride flush  10-40 mL Intracatheter Q12H   Continuous Infusions: PRN Meds:.acetaminophen **OR** acetaminophen, albuterol, alteplase, hydrALAZINE, HYDROmorphone (DILAUDID) injection, menthol-cetylpyridinium, ondansetron (ZOFRAN) IV, mouth rinse, oxyCODONE   Louie Bun,  MD 11/15/2023, 12:30 PM

## 2023-11-15 NOTE — Progress Notes (Signed)
PROGRESS NOTE        PATIENT DETAILS Name: [name] Age: 68 y.o. Sex: female Date of Birth: Nov 30, 1955 Admit Date: 10/27/2023 Admitting Physician Dolly Rias, MD URK:YHCWC, Devonne Doughty, NP  Brief Summary: Patient is a 68 y.o.  female with history of CKD 5, A-fib on Eliquis, CAD-who presented with abdominal pain-found to have acute diverticulitis with contained perforation involving the descending colon.  Hospital course complicated by worsening AKI-thought to have progressed to ESRD-requiring initiation of HD.  See below for further details.  Significant events: 2/2>> admit to [facility] 2/6>> CT abdomen worsening diverticulitis with perforation 2/7>> transitioned to full comfort measures-as with worsening AKI-however multiple family members arrived-comfort care revoked-now full code-full scope of treatment including HD/laparotomy with colostomy.  General surgery/nephrology reconsulted.  IR placed [facility] and patient underwent first hemodialysis.  2/8>> remains n.p.o-colectomy/colostomy later today. 2/20>> wound VAC reapplied  Significant studies: 2/2>> CT abdomen/pelvis: Acute diverticulitis-descending colon with contained perforation 2/6>> CT abdomen/pelvis: Worsening diverticulitis of descending colon with perforation and increasing failure-suspected oral contrast extravasation left abdomen.  Significant microbiology data: 2/2>> COVID/influenza/RSV PCR: Negative  Procedures: 2/8>> by Dr. Freida Busman underwent exploratory laparotomy with Hartman's resection for perforated diverticulitis with diffuse peritonitis, colostomy in place. 2/13 - R IJ Trialysis catheter - IR 2/14 -left IJ Central line placed by IR for TNA 11/12/2023. R.internal jugular tunneled HD catheter placement by IR  Consults: Surgery Renal Palliative IR  Subjective:   Significant events overnight, she denies any complaints today   Objective: Vitals: Blood pressure (!) 171/72, pulse 76,  temperature 97.9 F (36.6 C), resp. rate 13, height 5' 7.01" (1.702 m), weight 93.4 kg, SpO2 100%.   Exam:  Awake Alert, Oriented X 3, No new F.N deficits, Normal affect Symmetrical Chest wall movement, Good air movement bilaterally, CTAB RRR,No Gallops,Rubs or new Murmurs, No Parasternal Heave +ve B.Sounds, Abd Soft, midline surgical wound with wound VAC applied, left lower quadrant colostomy present  no Cyanosis, Clubbing or edema, No new Rash or bruise     Right upper chest tunneled HD catheter, left upper chest CVC line.  Likely will DC in next 24 hours   Assessment/Plan:  Acute diverticulitis with contained perforation of the descending colon  -Management Per general surgery, status post Hartman's procedure by Dr. Freida Busman 11/02/2023. -Currently off TPN, appetite has improved, tolerating p.o. intake, DC IV erythromycin . -Wound care per general surgery, wound VAC t reapplied 2/20 -Wound care following for colostomy care  -IV Zosyn discontinued 2/20, nontoxic-appearing, leukocytosis trending down.    AKI on CKD 5 with progression to ESRD AKI likely hemodynamically mediated-has now likely progressed to ESRD.  HD initiated via right IJ temporary catheter, switched to right IJ trialysis catheter by IR on 11/07/2023>> tunneled HD catheter right IJ on 11/12/2023 by IR.   CAD Currently no anginal symptoms Suspect not on antiplatelets as on anticoagulation with Eliquis/heparin Continue beta-blocker On Repatha injections as an outpatient.  PAF Sinus rhythm Continue Coreg Currently on Eliquis  Anemia of acute illness  Anemia of end-stage renal disease Brought on by acute illness, underlying renal insufficiency now developing to ESRD, surgical blood loss, gradually trending down, 2 units of packed RBC on 11/04/2023, on IV PPI.  -Received 1 unit PRBC 2/19, continue to monitor and transfuse as needed -IV iron and Procrit per renal  HTN Started to increase, continue with home dose  Coreg,  will start on amlodipine uptitrate dose as needed, meanwhile continue with hydralazine as needed.  DM-2 (A1c 6.6 on 2/3) Of TNA now tolerating oral diet, Semglee and sliding scale adjusted.   Recent Labs    11/14/23 1243 11/14/23 1732 11/14/23 2039  GLUCAP 192* 144* 111*       Palliative care/goals of care Palliative input greatly appreciated Class 1 Obesity: Estimated body mass index is 32.24 kg/m as calculated from the following:   Height as of this encounter: 5' 7.01" (1.702 m).   Weight as of this encounter: 93.4 kg.    Code status:   Code Status: Limited: Do not attempt resuscitation (DNR) -DNR-LIMITED -Do Not Intubate/DNI    DVT Prophylaxis: Place and maintain sequential compression device Start: 11/06/23 0613 apixaban (ELIQUIS) tablet 5 mg IV Heparin  Family Communication: Discussed with patient, all her questions answered.  Disposition Plan: Status is: Inpatient Will benefit from CIR, given multiple issues including her wound, will need frequent monitoring and observation by general surgery team, hemodialysis patient, she is on Eliquis she might need transfusions as well.  With her significant abdominal wound and wound VAC, significant deconditioning when she was independent at baseline, she is With prolonged hospital stay, surgery and significant weakness, will benefit from CIR stay to go back to baseline     Diet: Diet Order             Diet regular Room service appropriate? Yes; Fluid consistency: Thin  Diet effective now                   MEDICATIONS: Scheduled Meds:  alteplase       amLODipine  5 mg Oral Daily   apixaban  5 mg Oral BID   carvedilol  6.25 mg Oral BID WC   Chlorhexidine Gluconate Cloth  6 each Topical Q0600   darbepoetin (ARANESP) injection - DIALYSIS  60 mcg Subcutaneous Q Mon-1800   heparin sodium (porcine)  3,200 Units Intravenous Once   insulin aspart  0-15 Units Subcutaneous TID WC   insulin aspart  0-5 Units Subcutaneous  QHS   insulin glargine-yfgn  12 Units Subcutaneous Daily   mouth rinse  15 mL Mouth Rinse 4 times per day   pantoprazole (PROTONIX) IV  40 mg Intravenous Q12H   sodium chloride flush  10-40 mL Intracatheter Q12H   Continuous Infusions:   PRN Meds:.acetaminophen **OR** acetaminophen, albuterol, alteplase, hydrALAZINE, HYDROmorphone (DILAUDID) injection, menthol-cetylpyridinium, ondansetron (ZOFRAN) IV, mouth rinse, oxyCODONE   I have personally reviewed following labs and imaging studies  LABORATORY DATA:   Data Review:    Recent Labs  Lab 11/09/23 0958 11/10/23 0139 11/11/23 0327 11/12/23 0420 11/13/23 0052 11/14/23 0354 11/15/23 0806  WBC 24.7* 21.5* 22.5* 22.0* 18.8* 15.8* 13.5*  HGB 8.7* 7.9* 8.0* 8.0* 7.6* 8.2* 8.6*  HCT 26.8* 24.5* 24.8* 24.9* 23.9* 26.0* 26.7*  PLT 300 300 331 326 326 306 325  MCV 81.7 82.8 83.2 83.0 83.9 84.1 84.2  MCH 26.5 26.7 26.8 26.7 26.7 26.5 27.1  MCHC 32.5 32.2 32.3 32.1 31.8 31.5 32.2  RDW 16.0* 16.1* 16.7* 16.7* 16.8* 16.5* 16.3*  LYMPHSABS 1.5 1.5 0.5*  --   --   --   --   MONOABS 0.7 3.9* 2.5*  --   --   --   --   EOSABS 0.5 0.2 0.7*  --   --   --   --   BASOSABS 0.0 0.0 0.0  --   --   --   --  Recent Labs  Lab 11/09/23 0958 11/10/23 0139 11/11/23 0327 11/13/23 0052 11/14/23 0354 11/15/23 0806  NA 133* 137 137 136 135 136  K 3.4* 3.4* 3.7 4.0 3.7 3.5  CL 95* 99 102 100 97* 99  CO2 25 24 22 24 26 24   ANIONGAP 13 14 13 12 12 13   GLUCOSE 358* 122* 161* 128* 152* 96  BUN 38* 45* 55* 39* 25* 30*  CREATININE 5.43* 6.44* 7.89* 7.04* 5.39* 7.20*  AST  --   --  23  --   --   --   ALT  --   --  17  --   --   --   ALKPHOS  --   --  39  --   --   --   BILITOT  --   --  0.5  --   --   --   ALBUMIN  --   --  1.7*  --   --  1.9*  CRP 13.7* 12.1*  --   --   --   --   PROCALCITON 16.20 15.86 12.67  --   --   --   MG 1.8 1.9 1.8  --   --   --   PHOS 2.6 2.3* 2.2*  --   --  4.9*  CALCIUM 7.3* 7.3* 7.5* 7.6* 7.7* 7.8*       Recent Labs  Lab 11/09/23 0958 11/10/23 0139 11/11/23 0327 11/13/23 0052 11/14/23 0354 11/15/23 0806  CRP 13.7* 12.1*  --   --   --   --   PROCALCITON 16.20 15.86 12.67  --   --   --   MG 1.8 1.9 1.8  --   --   --   CALCIUM 7.3* 7.3* 7.5* 7.6* 7.7* 7.8*    --------------------------------------------------------------------------------------------------------------- Lab Results  Component Value Date   CHOL 134 12/21/2019   HDL 49 12/21/2019   LDLCALC 69 12/21/2019   TRIG 148 11/11/2023   CHOLHDL 2.7 12/21/2019    Lab Results  Component Value Date   HGBA1C 6.6 (H) 10/28/2023      Micro Results Recent Results (from the past 240 hours)  MRSA Next Gen by PCR, Nasal     Status: None   Collection Time: 11/10/23  5:14 AM   Specimen: Nasal Mucosa; Nasal Swab  Result Value Ref Range Status   MRSA by PCR Next Gen NOT DETECTED NOT DETECTED Final    Comment: (NOTE) The GeneXpert MRSA Assay (FDA approved for NASAL specimens only), is one component of a comprehensive MRSA colonization surveillance program. It is not intended to diagnose MRSA infection nor to guide or monitor treatment for MRSA infections. Test performance is not FDA approved in patients less than 54 years old. Performed at [facility], [address] [place], [place] [address]      Radiology Reports IR Fluoro Guide CV Line Right Result Date: 11/12/2023 CLINICAL DATA:  End-stage renal disease and status post placement of non tunneled temporary dialysis catheter on 11/01/2023. The patient now requires placement of a tunneled dialysis catheter for further longer-term hemodialysis. EXAM: TUNNELED CENTRAL VENOUS HEMODIALYSIS CATHETER PLACEMENT WITH ULTRASOUND AND FLUOROSCOPIC GUIDANCE ANESTHESIA/SEDATION: Moderate (conscious) sedation was employed during this procedure. A total of Versed 1.0 mg and Fentanyl 50 mcg was administered intravenously. Moderate Sedation Time: 16 minutes. The patient's level of  consciousness and vital signs were monitored continuously by radiology nursing throughout the procedure under my direct supervision. MEDICATIONS: The patient is receiving IV Rocephin  every 8 hours in the hospital and did not receive additional antibiotics for the procedure. FLUOROSCOPY: 24 seconds.  2.3 mGy. PROCEDURE: The procedure, risks, benefits, and alternatives were explained to the patient. Questions regarding the procedure were encouraged and answered. The patient understands and consents to the procedure. A timeout was performed prior to initiating the procedure. A non tunneled temporary right-sided dialysis catheter was removed prior to the procedure and pressure held at the exit site. The right neck and chest were prepped with chlorhexidine in a sterile fashion, and a sterile drape was applied covering the operative field. Maximum barrier sterile technique with sterile gowns and gloves were used for the procedure. Local anesthesia was provided with 1% lidocaine. Ultrasound was used to confirm patency of the right internal jugular vein. An ultrasound image was saved and recorded. After creating a small venotomy incision, a 21 gauge needle was advanced into the right internal jugular vein under direct, real-time ultrasound guidance. Ultrasound image documentation was performed. After securing guidewire access, an 8 Fr dilator was placed. A J-wire was kinked to measure appropriate catheter length. A Palindrome tunneled hemodialysis catheter measuring 19 cm from tip to cuff was chosen for placement. This was tunneled in a retrograde fashion from the chest wall to the venotomy incision. At the venotomy, serial dilatation was performed and a 15 Fr peel-away sheath was placed over a guidewire. The catheter was then placed through the sheath and the sheath removed. Final catheter positioning was confirmed and documented with a fluoroscopic spot image. The catheter was aspirated, flushed with saline, and injected  with appropriate volume heparin dwells. The venotomy incision was closed with subcuticular 4-0 Vicryl. Dermabond was applied to the incision. The catheter exit site was secured with 0-Prolene retention sutures. COMPLICATIONS: None.  No pneumothorax. FINDINGS: After catheter placement, the tip lies in the right atrium. The catheter aspirates normally and is ready for immediate use. IMPRESSION: Placement of tunneled hemodialysis catheter via new puncture of the right internal jugular vein after removal of the non tunneled catheter. The new catheter tip lies in the right atrium. The catheter is ready for immediate use. Electronically Signed   By: Irish Lack M.D.   On: 11/12/2023 10:45   IR US Guide Vasc Access Right Result Date: 11/12/2023 CLINICAL DATA:  End-stage renal disease and status post placement of non tunneled temporary dialysis catheter on 11/01/2023. The patient now requires placement of a tunneled dialysis catheter for further longer-term hemodialysis. EXAM: TUNNELED CENTRAL VENOUS HEMODIALYSIS CATHETER PLACEMENT WITH ULTRASOUND AND FLUOROSCOPIC GUIDANCE ANESTHESIA/SEDATION: Moderate (conscious) sedation was employed during this procedure. A total of Versed 1.0 mg and Fentanyl 50 mcg was administered intravenously. Moderate Sedation Time: 16 minutes. The patient's level of consciousness and vital signs were monitored continuously by radiology nursing throughout the procedure under my direct supervision. MEDICATIONS: The patient is receiving IV Rocephin every 8 hours in the hospital and did not receive additional antibiotics for the procedure. FLUOROSCOPY: 24 seconds.  2.3 mGy. PROCEDURE: The procedure, risks, benefits, and alternatives were explained to the patient. Questions regarding the procedure were encouraged and answered. The patient understands and consents to the procedure. A timeout was performed prior to initiating the procedure. A non tunneled temporary right-sided dialysis catheter was  removed prior to the procedure and pressure held at the exit site. The right neck and chest were prepped with chlorhexidine in a sterile fashion, and a sterile drape was applied covering the operative field. Maximum barrier sterile technique with sterile gowns and gloves  were used for the procedure. Local anesthesia was provided with 1% lidocaine. Ultrasound was used to confirm patency of the right internal jugular vein. An ultrasound image was saved and recorded. After creating a small venotomy incision, a 21 gauge needle was advanced into the right internal jugular vein under direct, real-time ultrasound guidance. Ultrasound image documentation was performed. After securing guidewire access, an 8 Fr dilator was placed. A J-wire was kinked to measure appropriate catheter length. A Palindrome tunneled hemodialysis catheter measuring 19 cm from tip to cuff was chosen for placement. This was tunneled in a retrograde fashion from the chest wall to the venotomy incision. At the venotomy, serial dilatation was performed and a 15 Fr peel-away sheath was placed over a guidewire. The catheter was then placed through the sheath and the sheath removed. Final catheter positioning was confirmed and documented with a fluoroscopic spot image. The catheter was aspirated, flushed with saline, and injected with appropriate volume heparin dwells. The venotomy incision was closed with subcuticular 4-0 Vicryl. Dermabond was applied to the incision. The catheter exit site was secured with 0-Prolene retention sutures. COMPLICATIONS: None.  No pneumothorax. FINDINGS: After catheter placement, the tip lies in the right atrium. The catheter aspirates normally and is ready for immediate use. IMPRESSION: Placement of tunneled hemodialysis catheter via new puncture of the right internal jugular vein after removal of the non tunneled catheter. The new catheter tip lies in the right atrium. The catheter is ready for immediate use. Electronically  Signed   By: Irish Lack M.D.   On: 11/12/2023 10:45   IR US Guide Vasc Access Left Result Date: 11/08/2023 INDICATION: ESRD.  IV antibiotics. EXAM: ULTRASOUND AND FLUOROSCOPIC GUIDED PLACEMENT OF TUNNELED CENTRAL VENOUS "Powerline" CATHETER MEDICATIONS: The patient was on scheduled IV antibiotics. ANESTHESIA/SEDATION: Local anesthetic was administered. The patient was continuously monitored during the procedure by the interventional radiology nurse under my direct supervision. FLUOROSCOPY TIME:  Fluoroscopic dose; 3.8 mGy COMPLICATIONS: None immediate. PROCEDURE: Informed written consent was obtained from the patient and/or patient's representative after a discussion of the risks, benefits, and alternatives to treatment. Questions regarding the procedure were encouraged and answered. The LEFT neck and chest were prepped with chlorhexidine in a sterile fashion, and a sterile drape was applied covering the operative field. Maximum barrier sterile technique with sterile gowns and gloves were used for the procedure. A timeout was performed prior to the initiation of the procedure. After the overlying soft tissues were anesthetized, a small venotomy incision was created and a micropuncture kit was utilized to access the internal jugular vein. Real-time ultrasound guidance was utilized for vascular access including the acquisition of a permanent ultrasound image documenting patency of the accessed vessel. The microwire was utilized to measure appropriate catheter length. The micropuncture sheath was exchanged for a peel-away sheath over a guidewire. A 5 Fr dual lumen tunneled central venous catheter measuring 33 cm was tunneled in a retrograde fashion from the anterior chest wall to the venotomy incision. The catheter was then placed through the peel-away sheath with tip ultimately positioned at the superior caval-atrial junction. Final catheter positioning was confirmed and documented with a spot radiographic  image. The catheter aspirates and flushes normally. The catheter was flushed with appropriate volume heparin dwells. The catheter exit site was secured with a 3-0 Ethilon retention suture. The venotomy incision was closed with Dermabond. Dressings were applied. The patient tolerated the procedure well without immediate post procedural complication. FINDINGS: After catheter placement, the tip lies within the superior  apect of the right atrium. The catheter aspirates and flushes normally and is ready for immediate use. IMPRESSION: Successful placement of 33 cm dual lumen tunneled "Powerline" central venous catheter via the LEFT internal jugular vein The tip of the catheter is positioned within the proximal RIGHT atrium. The catheter is ready for immediate use. Roanna Banning, MD [facility] Radiology Electronically Signed   By: Roanna Banning M.D.   On: 11/08/2023 18:10   IR Fluoro Guide CV Line Left Result Date: 11/08/2023 INDICATION: ESRD.  IV antibiotics. EXAM: ULTRASOUND AND FLUOROSCOPIC GUIDED PLACEMENT OF TUNNELED CENTRAL VENOUS "Powerline" CATHETER MEDICATIONS: The patient was on scheduled IV antibiotics. ANESTHESIA/SEDATION: Local anesthetic was administered. The patient was continuously monitored during the procedure by the interventional radiology nurse under my direct supervision. FLUOROSCOPY TIME:  Fluoroscopic dose; 3.8 mGy COMPLICATIONS: None immediate. PROCEDURE: Informed written consent was obtained from the patient and/or patient's representative after a discussion of the risks, benefits, and alternatives to treatment. Questions regarding the procedure were encouraged and answered. The LEFT neck and chest were prepped with chlorhexidine in a sterile fashion, and a sterile drape was applied covering the operative field. Maximum barrier sterile technique with sterile gowns and gloves were used for the procedure. A timeout was performed prior to the  initiation of the procedure. After the overlying soft tissues were anesthetized, a small venotomy incision was created and a micropuncture kit was utilized to access the internal jugular vein. Real-time ultrasound guidance was utilized for vascular access including the acquisition of a permanent ultrasound image documenting patency of the accessed vessel. The microwire was utilized to measure appropriate catheter length. The micropuncture sheath was exchanged for a peel-away sheath over a guidewire. A 5 Fr dual lumen tunneled central venous catheter measuring 33 cm was tunneled in a retrograde fashion from the anterior chest wall to the venotomy incision. The catheter was then placed through the peel-away sheath with tip ultimately positioned at the superior caval-atrial junction. Final catheter positioning was confirmed and documented with a spot radiographic image. The catheter aspirates and flushes normally. The catheter was flushed with appropriate volume heparin dwells. The catheter exit site was secured with a 3-0 Ethilon retention suture. The venotomy incision was closed with Dermabond. Dressings were applied. The patient tolerated the procedure well without immediate post procedural complication. FINDINGS: After catheter placement, the tip lies within the superior apect of the right atrium. The catheter aspirates and flushes normally and is ready for immediate use. IMPRESSION: Successful placement of 33 cm dual lumen tunneled "Powerline" central venous catheter via the LEFT internal jugular vein The tip of the catheter is positioned within the proximal RIGHT atrium. The catheter is ready for immediate use. Roanna Banning, MD [facility] Radiology Electronically Signed   By: Roanna Banning M.D.   On: 11/08/2023 18:10   VAS [place] LOWER EXTREMITY VENOUS (DVT) Result Date: 11/07/2023  Lower Venous DVT Study Patient Name:  [name] [facility]  Date of Exam:   11/07/2023  Medical Rec #: [id number]          Accession #:    [id number] Date of Birth: Dec 31, 1955          Patient Gender: F Patient Age:   31 years Exam Location:  [facility] Procedure:      VAS [place] LOWER EXTREMITY VENOUS (DVT) Referring Phys: Tresa Endo OSBORNE --------------------------------------------------------------------------------  Indications: Pain, stroke, and A-fib, anemia.  Comparison Study: No prior exam. Performing Technologist: [name]  [place]  Examination Guidelines: A complete evaluation includes B-mode imaging, spectral Doppler, color Doppler, and power Doppler as needed of all accessible portions of each vessel. Bilateral testing is considered an integral part of a complete examination. Limited examinations for reoccurring indications may be performed as noted. The reflux portion of the exam is performed with the patient in reverse Trendelenburg.  +---------+---------------+---------+-----------+----------+--------------+ RIGHT    CompressibilityPhasicitySpontaneityPropertiesThrombus Aging +---------+---------------+---------+-----------+----------+--------------+ CFV      Full           Yes      Yes                                 +---------+---------------+---------+-----------+----------+--------------+ SFJ      Full           Yes      Yes                                 +---------+---------------+---------+-----------+----------+--------------+ FV Prox  Full                                                        +---------+---------------+---------+-----------+----------+--------------+ FV Mid   Full                                                        +---------+---------------+---------+-----------+----------+--------------+ FV DistalFull                                                        +---------+---------------+---------+-----------+----------+--------------+ PFV      Full                                                         +---------+---------------+---------+-----------+----------+--------------+ POP      Full           Yes      Yes                                 +---------+---------------+---------+-----------+----------+--------------+ PTV      Full                                                        +---------+---------------+---------+-----------+----------+--------------+ PERO     Full                                                        +---------+---------------+---------+-----------+----------+--------------+   +---------+---------------+---------+-----------+----------+--------------+  LEFT     CompressibilityPhasicitySpontaneityPropertiesThrombus Aging +---------+---------------+---------+-----------+----------+--------------+ CFV      Full           Yes      Yes                                 +---------+---------------+---------+-----------+----------+--------------+ SFJ      Full           Yes      Yes                                 +---------+---------------+---------+-----------+----------+--------------+ FV Prox  Full                                                        +---------+---------------+---------+-----------+----------+--------------+ FV Mid   Full                                                        +---------+---------------+---------+-----------+----------+--------------+ FV DistalFull                                                        +---------+---------------+---------+-----------+----------+--------------+ PFV      Full                                                        +---------+---------------+---------+-----------+----------+--------------+ POP      Full           Yes      Yes                                 +---------+---------------+---------+-----------+----------+--------------+ PTV      Full                                                         +---------+---------------+---------+-----------+----------+--------------+ PERO     Full                                                        +---------+---------------+---------+-----------+----------+--------------+     Summary: BILATERAL: - No evidence of deep vein thrombosis seen in the lower extremities, bilaterally. -No evidence of popliteal cyst, bilaterally.   *See table(s) above for measurements and observations. Electronically signed by Heath Lark on 11/07/2023 at 4:05:14 PM.    Final    CT ABDOMEN  PELVIS WO CONTRAST Result Date: 11/06/2023 CLINICAL DATA:  Diverticulitis, complication suspected EXAM: CT ABDOMEN AND PELVIS WITHOUT CONTRAST TECHNIQUE: Multidetector CT imaging of the abdomen and pelvis was performed following the standard protocol without IV contrast. RADIATION DOSE REDUCTION: This exam was performed according to the departmental dose-optimization program which includes automated exposure control, adjustment of the mA and/or kV according to patient size and/or use of iterative reconstruction technique. COMPARISON:  10/31/2023 FINDINGS: Lower chest: Volume loss and opacity in the lower lungs consistent with atelectasis. There is a central line with tip at the upper right atrium. Coronary atherosclerosis. Hepatobiliary: No focal liver abnormality. Calcific in the intermediate density in the gallbladder attributed to stones. No biliary dilatation or pericholecystic edema. Pancreas: Unremarkable. Spleen: Unremarkable. Adrenals/Urinary Tract: Negative adrenals. No hydronephrosis or stone. Renal cortical thinning and lobulation at the left upper pole. 3 mm stone at the upper pole left kidney. The urinary bladder is collapsed around a Foley catheter. Stomach/Bowel: Descending colostomy. Bowel anastomosis in the right abdomen. No evidence of bowel obstruction. Fluid collection with loculation appearance/peritoneal thickening along the left flank measuring up to 5 x 6 x 10 cm. Milder  fluid accumulation in the ventral peritoneal space. Vascular/Lymphatic: Extensive atheromatous calcification of the aorta and iliacs. No mass or adenopathy. Reproductive:No pathologic findings. Other: No ascites or pneumoperitoneum. Musculoskeletal: No acute abnormalities.  T9 butterfly vertebra IMPRESSION: 1. Interval colostomy with fluid collection along the left gutter, dominant pocket measuring 10 x 6 x 5 cm. 2. No bowel obstruction. 3. Cholelithiasis and left nephrolithiasis. Electronically Signed   By: Tiburcio Pea M.D.   On: 11/06/2023 07:12   DG Chest Port 1 View Result Date: 11/03/2023 CLINICAL DATA:  Shortness of breath EXAM: PORTABLE CHEST - 1 VIEW COMPARISON:  11/11/2018 FINDINGS: Unchanged mild cardiomegaly and pulmonary vascular congestion. Interval worsening of left basilar opacity likely due to atelectasis. Lungs otherwise clear. Examination is limited due to expiratory phase of imaging. Interval placement of temporary right IJ hemodialysis catheter which terminates in the region of the right atrium. Nasogastric tube terminates in the left upper quadrant. IMPRESSION: Interval worsening of left basilar opacity which is likely due to atelectasis. Electronically Signed   By: Acquanetta Belling M.D.   On: 11/03/2023 07:22   IR Fluoro Guide CV Line Right Result Date: 11/01/2023 INDICATION: ESRD requiring HD. EXAM: NON-TUNNELED CENTRAL VENOUS HEMODIALYSIS CATHETER PLACEMENT WITH ULTRASOUND AND FLUOROSCOPIC GUIDANCE COMPARISON:  CT AP 10/31/2023. MEDICATIONS: Local anesthetic was administered. FLUOROSCOPY TIME:  Fluoroscopic dose; 35 mGy COMPLICATIONS: None immediate. PROCEDURE: Informed written consent was obtained from the patient and/or patient's representative after a discussion of the risks, benefits, and alternatives to treatment. Questions regarding the procedure were encouraged and answered. The RIGHT neck and chest were prepped with chlorhexidine in a sterile fashion, and a sterile drape was  applied covering the operative field. Maximum barrier sterile technique with sterile gowns and gloves were used for the procedure. A timeout was performed prior to the initiation of the procedure. After the overlying soft tissues were anesthetized, a small venotomy incision was created and a micropuncture kit was utilized to access the external jugular vein. Real-time ultrasound guidance was utilized for vascular access including the acquisition of a permanent ultrasound image documenting patency of the accessed vessel. The microwire was utilized to measure appropriate catheter length. A stiff glidewire was advanced to the level of the IVC. Under fluoroscopic guidance, the venotomy was serially dilated, ultimately allowing placement of a 20 cm temporary Trialysis catheter with tip ultimately  terminating within the superior aspect of the right atrium. Final catheter positioning was confirmed and documented with a spot radiographic image. The catheter aspirates and flushes normally. The catheter was flushed with appropriate volume heparin dwells. The catheter exit site was secured with a 2-0 Ethilon retention suture. A dressing was placed. The patient tolerated the procedure well without immediate post procedural complication. IMPRESSION: Successful placement of a RIGHT EXTERNAL jugular approach 20 cm non-tunneled dialysis catheter The tip of the catheter is positioned within the proximal RIGHT atrium. The catheter is ready for immediate use. PLAN: This catheter may be converted to a tunneled dialysis catheter at a later date as indicated. Roanna Banning, MD [facility] Electronically Signed   By: Roanna Banning M.D.   On: 11/01/2023 18:15   IR US Guide Vasc Access Right Result Date: 11/01/2023 INDICATION: ESRD requiring HD. EXAM: NON-TUNNELED CENTRAL VENOUS HEMODIALYSIS CATHETER PLACEMENT WITH ULTRASOUND AND FLUOROSCOPIC GUIDANCE COMPARISON:  CT AP 10/31/2023.  MEDICATIONS: Local anesthetic was administered. FLUOROSCOPY TIME:  Fluoroscopic dose; 35 mGy COMPLICATIONS: None immediate. PROCEDURE: Informed written consent was obtained from the patient and/or patient's representative after a discussion of the risks, benefits, and alternatives to treatment. Questions regarding the procedure were encouraged and answered. The RIGHT neck and chest were prepped with chlorhexidine in a sterile fashion, and a sterile drape was applied covering the operative field. Maximum barrier sterile technique with sterile gowns and gloves were used for the procedure. A timeout was performed prior to the initiation of the procedure. After the overlying soft tissues were anesthetized, a small venotomy incision was created and a micropuncture kit was utilized to access the external jugular vein. Real-time ultrasound guidance was utilized for vascular access including the acquisition of a permanent ultrasound image documenting patency of the accessed vessel. The microwire was utilized to measure appropriate catheter length. A stiff glidewire was advanced to the level of the IVC. Under fluoroscopic guidance, the venotomy was serially dilated, ultimately allowing placement of a 20 cm temporary Trialysis catheter with tip ultimately terminating within the superior aspect of the right atrium. Final catheter positioning was confirmed and documented with a spot radiographic image. The catheter aspirates and flushes normally. The catheter was flushed with appropriate volume heparin dwells. The catheter exit site was secured with a 2-0 Ethilon retention suture. A dressing was placed. The patient tolerated the procedure well without immediate post procedural complication. IMPRESSION: Successful placement of a RIGHT EXTERNAL jugular approach 20 cm non-tunneled dialysis catheter The tip of the catheter is positioned within the proximal RIGHT atrium. The catheter is ready for immediate use. PLAN: This catheter  may be converted to a tunneled dialysis catheter at a later date as indicated. Roanna Banning, MD [facility] Electronically Signed   By: Roanna Banning M.D.   On: 11/01/2023 18:15   CT ABDOMEN PELVIS WO CONTRAST Result Date: 10/31/2023 CLINICAL DATA:  Diverticulitis, complication suspected. EXAM: CT ABDOMEN AND PELVIS WITHOUT CONTRAST TECHNIQUE: Multidetector CT imaging of the abdomen and pelvis was performed following the standard protocol without IV contrast. RADIATION DOSE REDUCTION: This exam was performed according to the departmental dose-optimization program which includes automated exposure control, adjustment of the mA and/or kV according to patient size and/or use of iterative reconstruction technique. COMPARISON:  10/27/2023. FINDINGS: Lower chest: Heart is enlarged and multi-vessel coronary artery calcifications are noted. Consolidation and patchy airspace disease is present at the lung bases bilaterally. Hepatobiliary: No focal liver abnormality is seen.  Stones are present within the gallbladder. No biliary ductal dilatation. Pancreas: Unremarkable. No pancreatic ductal dilatation or surrounding inflammatory changes. Spleen: Normal in size without focal abnormality. Adrenals/Urinary Tract: The adrenal glands are within normal limits. Calcifications are present in the upper pole of the left kidney. There is a cyst in the lower pole of the right kidney. No hydronephrosis bilaterally. The bladder is unremarkable. Stomach/Bowel: The stomach is within normal limits. No bowel obstruction or pneumatosis is seen. Right hemicolectomy changes are noted. There is diffuse colonic wall thickening. Scattered diverticula are present along the colon. A few small foci of free air are noted in the mesentery in the mid left abdomen, slightly increased from the previous exam. There is suggestion of contrast extravasation in the region. There is fine no definite  abscess is seen. Vascular/Lymphatic: Aortic atherosclerosis. No enlarged abdominal or pelvic lymph nodes. Reproductive: No acute abnormality. Other: Mild ascites is noted in all 4 quadrants. A fat containing umbilical hernia is noted. There is mild anasarca. Musculoskeletal: The bony structures are stable. IMPRESSION: 1. Worsening diverticulitis of the descending colon with perforation and increasing free air and suspected oral contrast extravasation in the mid left abdomen. No definite abscess is seen. Surgical consultation is recommended. 2. Mild ascites. 3. Diffuse colonic wall thickening which may be due to associated inflammatory changes. 4. Patchy airspace disease at the lung bases with bilateral lower lobe consolidation. 5. Cholelithiasis. 6. Aortic atherosclerosis. Critical Value/emergent results were called by telephone at the time of interpretation on 10/31/2023 at 11:59 pm to provider Dr. Antionette Char, who verbally acknowledged these results. Electronically Signed   By: Thornell Sartorius M.D.   On: 10/31/2023 23:59   CT ABDOMEN PELVIS WO CONTRAST Result Date: 10/27/2023 CLINICAL DATA:  Acute nonlocalized abdominal pain. Chills and fever. Vomiting and diarrhea. EXAM: CT ABDOMEN AND PELVIS WITHOUT CONTRAST TECHNIQUE: Multidetector CT imaging of the abdomen and pelvis was performed following the standard protocol without IV contrast. RADIATION DOSE REDUCTION: This exam was performed according to the departmental dose-optimization program which includes automated exposure control, adjustment of the mA and/or kV according to patient size and/or use of iterative reconstruction technique. COMPARISON:  CT abdomen and pelvis 04/19/2021 FINDINGS: Lower chest: Bibasilar atelectasis.  No acute abnormality. Hepatobiliary: Cholelithiasis without evidence of acute cholecystitis. No biliary dilation. Unremarkable noncontrast appearance of the liver. Pancreas: Unremarkable. Spleen: Unremarkable. Adrenals/Urinary Tract: Normal adrenal  glands. No urinary calculi or hydronephrosis. Unremarkable bladder. Stomach/Bowel: Stomach is within normal limits. No bowel obstruction. Colonic diverticulosis. Wall thickening, inflammatory stranding, and free fluid about the descending colon. Loosely organized fluid and gas about the inflamed descending colon (circa series 3/image [id number]) compatible with contained perforation. The adjacent jejunum is inflamed. Postoperative change about the colon with ileocolonic anastomosis in the right upper quadrant. Vascular/Lymphatic: Aortic atherosclerosis. No enlarged abdominal or pelvic lymph nodes. Reproductive: No acute abnormality. Other: Fat containing periumbilical hernia. Musculoskeletal: No acute fracture. IMPRESSION: 1. Acute diverticulitis of the descending colon with contained perforation. 2. Inflamed jejunum adjacent to the perforated diverticulitis. 3.  Aortic Atherosclerosis (ICD10-I70.0). Critical Value/emergent results were called by telephone at the time of interpretation on 10/27/2023 at 9:22 pm to provider [facility] , who verbally acknowledged these results. Electronically Signed   By: Minerva Fester M.D.   On: 10/27/2023 21:24    Signature  -   Huey Bienenstock M.D on 11/15/2023 at 12:57 PM   -  To page go to [email or web address]

## 2023-11-16 DIAGNOSIS — K5732 Diverticulitis of large intestine without perforation or abscess without bleeding: Secondary | ICD-10-CM | POA: Diagnosis not present

## 2023-11-16 LAB — GLUCOSE, CAPILLARY
Glucose-Capillary: 73 mg/dL (ref 70–99)
Glucose-Capillary: 87 mg/dL (ref 70–99)
Glucose-Capillary: 175 mg/dL — ABNORMAL HIGH (ref 70–99)
Glucose-Capillary: 125 mg/dL — ABNORMAL HIGH (ref 70–99)

## 2023-11-16 LAB — PARATHYROID HORMONE, INTACT (NO CA): PTH: 275 pg/mL — ABNORMAL HIGH (ref 15–65)

## 2023-11-16 NOTE — Plan of Care (Signed)
  Problem: Education: Goal: Ability to describe self-care measures that may prevent or decrease complications (Diabetes Survival Skills Education) will improve Outcome: Progressing Goal: Individualized Educational Video(s) Outcome: Progressing   Problem: Coping: Goal: Ability to adjust to condition or change in health will improve Outcome: Progressing   Problem: Fluid Volume: Goal: Ability to maintain a balanced intake and output will improve Outcome: Progressing   Problem: Health Behavior/Discharge Planning: Goal: Ability to identify and utilize available resources and services will improve Outcome: Progressing Goal: Ability to manage health-related needs will improve Outcome: Progressing   Problem: Metabolic: Goal: Ability to maintain appropriate glucose levels will improve Outcome: Progressing   Problem: Nutritional: Goal: Maintenance of adequate nutrition will improve Outcome: Progressing Goal: Progress toward achieving an optimal weight will improve Outcome: Progressing   Problem: Skin Integrity: Goal: Risk for impaired skin integrity will decrease Outcome: Progressing   Problem: Tissue Perfusion: Goal: Adequacy of tissue perfusion will improve Outcome: Progressing   Problem: Education: Goal: Knowledge of General Education information will improve Description: Including pain rating scale, medication(s)/side effects and non-pharmacologic comfort measures Outcome: Progressing   Problem: Health Behavior/Discharge Planning: Goal: Ability to manage health-related needs will improve Outcome: Progressing   Problem: Clinical Measurements: Goal: Ability to maintain clinical measurements within normal limits will improve Outcome: Progressing Goal: Will remain free from infection Outcome: Progressing Goal: Diagnostic test results will improve Outcome: Progressing Goal: Respiratory complications will improve Outcome: Progressing Goal: Cardiovascular complication will  be avoided Outcome: Progressing   Problem: Activity: Goal: Risk for activity intolerance will decrease Outcome: Progressing   Problem: Nutrition: Goal: Adequate nutrition will be maintained Outcome: Progressing   Problem: Coping: Goal: Level of anxiety will decrease Outcome: Progressing   Problem: Elimination: Goal: Will not experience complications related to bowel motility Outcome: Progressing Goal: Will not experience complications related to urinary retention Outcome: Progressing   Problem: Pain Managment: Goal: General experience of comfort will improve and/or be controlled Outcome: Progressing   Problem: Safety: Goal: Ability to remain free from injury will improve Outcome: Progressing   Problem: Skin Integrity: Goal: Risk for impaired skin integrity will decrease Outcome: Progressing   Problem: Education: Goal: Knowledge of the prescribed therapeutic regimen will improve Outcome: Progressing   Problem: Coping: Goal: Ability to identify and develop effective coping behavior will improve Outcome: Progressing   Problem: Clinical Measurements: Goal: Quality of life will improve Outcome: Progressing   Problem: Respiratory: Goal: Verbalizations of increased ease of respirations will increase Outcome: Progressing   Problem: Role Relationship: Goal: Family's ability to cope with current situation will improve Outcome: Progressing Goal: Ability to verbalize concerns, feelings, and thoughts to partner or family member will improve Outcome: Progressing   Problem: Pain Management: Goal: Satisfaction with pain management regimen will improve Outcome: Progressing   Problem: Education: Goal: Knowledge of disease and its progression will improve Outcome: Progressing Goal: Individualized Educational Video(s) Outcome: Progressing   Problem: Fluid Volume: Goal: Compliance with measures to maintain balanced fluid volume will improve Outcome: Progressing    Problem: Health Behavior/Discharge Planning: Goal: Ability to manage health-related needs will improve Outcome: Progressing   Problem: Nutritional: Goal: Ability to make healthy dietary choices will improve Outcome: Progressing   Problem: Clinical Measurements: Goal: Complications related to the disease process, condition or treatment will be avoided or minimized Outcome: Progressing

## 2023-11-16 NOTE — Progress Notes (Signed)
 Occupational Therapy Treatment Patient Details Name: [name] MRN: [id number] DOB: 11-Jun-1956 Today's Date: 11/16/2023   History of present illness Patient is a 68 y.o.  female - who presented with abdominal pain-found to have acute diverticulitis with contained perforation involving the descending colon. 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  Past medical history of CKD 5, A-fib on Eliquis, CAD   OT comments  Pt at this time presented in bed and nursing was made aware of vac blockage. She then completed with increase in time transfer to [facility] with step pivot transfer with min guard. She then completed toileting hygiene with min guard. She then was able to complete UE bathing with min guard and then completed peri hygiene with min guard in standing. At this time she was also left on RA as was able to remain at 92-94% with max HR at 125 when completion of ADLS and mobility in room level. Patient will benefit from intensive inpatient follow-up therapy, >3 hours/day.      If plan is discharge home, recommend the following:  A lot of help with walking and/or transfers;A lot of help with bathing/dressing/bathroom;Assistance with cooking/housework;Direct supervision/assist for financial management;Direct supervision/assist for medications management;Assist for transportation;Help with stairs or ramp for entrance   Equipment Recommendations  Other (comment) (tbd)    Recommendations for Other Services      Precautions / Restrictions Precautions Precautions: Fall Recall of Precautions/Restrictions: Intact Restrictions Weight Bearing Restrictions Per Provider Order: No       Mobility Bed Mobility Overal bed mobility: Needs Assistance Bed Mobility: Supine to Sit Rolling: Supervision Sidelying to sit: Supervision            Transfers Overall transfer level: Needs assistance Equipment used: Rolling walker (2 wheels) Transfers: Sit to/from Stand Sit to Stand:  Contact guard assist                 Balance Overall balance assessment: Needs assistance Sitting-balance support: Feet supported, Bilateral upper extremity supported Sitting balance-Leahy Scale: Good     Standing balance support: Bilateral upper extremity supported, Single extremity supported, No upper extremity supported Standing balance-Leahy Scale: Fair                             ADL either performed or assessed with clinical judgement   ADL Overall ADL's : Needs assistance/impaired Eating/Feeding: Set up;Sitting   Grooming: Wash/dry hands;Wash/dry face;Set up;Sitting   Upper Body Bathing: Contact guard assist;Sitting   Lower Body Bathing: Moderate assistance;Maximal assistance;Sit to/from stand;Sitting/lateral leans   Upper Body Dressing : Contact guard assist;Sitting   Lower Body Dressing: Moderate assistance;Maximal assistance;Sit to/from stand   Toilet Transfer: Contact guard assist;BSC/3in1;Stand-pivot   Toileting- Clothing Manipulation and Hygiene: Contact guard assist;Sit to/from stand       Functional mobility during ADLs: Contact guard assist;Rolling walker (2 wheels);Cueing for sequencing;Cueing for safety      Extremity/Trunk Assessment Upper Extremity Assessment Upper Extremity Assessment: Overall WFL for tasks assessed   Lower Extremity Assessment Lower Extremity Assessment: Defer to PT evaluation        Vision   Vision Assessment?: No apparent visual deficits   Perception Perception Perception: Not tested   Praxis Praxis Praxis: Not tested   Communication Communication Communication: No apparent difficulties   Cognition Arousal: Alert Behavior During Therapy: WFL for tasks assessed/performed Cognition: No apparent impairments  Following commands: Intact        Cueing   Cueing Techniques: Verbal cues  Exercises      Shoulder Instructions       General Comments was  able to remain on RA with 92-95% and max HR 125    Pertinent Vitals/ Pain       Pain Assessment Pain Assessment: Faces Faces Pain Scale: Hurts a little bit Breathing: normal Negative Vocalization: none Facial Expression: smiling or inexpressive Body Language: relaxed Consolability: no need to console PAINAD Score: 0 Pain Location: abdomen Pain Descriptors / Indicators: Discomfort Pain Intervention(s): Limited activity within patient's tolerance  Home Living                                          Prior Functioning/Environment              Frequency  Min 1X/week        Progress Toward Goals  OT Goals(current goals can now be found in the care plan section)  Progress towards OT goals: Progressing toward goals  Acute Rehab OT Goals Patient Stated Goal: to get stronger OT Goal Formulation: With patient Time For Goal Achievement: 11/30/23 Potential to Achieve Goals: Good ADL Goals Pt Will Perform Upper Body Dressing: with contact guard assist;sitting Pt Will Perform Lower Body Dressing: with min assist;with adaptive equipment;sitting/lateral leans;sit to/from stand Pt Will Transfer to Toilet: with contact guard assist;ambulating;regular height toilet Additional ADL Goal #1: Pt will tolerate OOB activity x10 min in prep for ADLs  Plan      Co-evaluation                 AM-PAC OT "6 Clicks" Daily Activity     Outcome Measure   Help from another person eating meals?: None Help from another person taking care of personal grooming?: A Little Help from another person toileting, which includes using toliet, bedpan, or urinal?: A Little Help from another person bathing (including washing, rinsing, drying)?: A Lot Help from another person to put on and taking off regular upper body clothing?: A Little Help from another person to put on and taking off regular lower body clothing?: A Lot 6 Click Score: 17    End of Session Equipment Utilized  During Treatment: Rolling walker (2 wheels)  OT Visit Diagnosis: Unsteadiness on feet (R26.81);Other abnormalities of gait and mobility (R26.89);Muscle weakness (generalized) (M62.81)   Activity Tolerance Patient tolerated treatment well   Patient Left in bed;in chair   Nurse Communication Mobility status        Time: 1313-1420 OT Time Calculation (min): 67 min  Charges: OT General Charges $OT Visit: 1 Visit OT Treatments $Self Care/Home Management : 53-67 mins  Presley Raddle OTR/L  Acute Rehab Services  [phone] office number   Alphia Moh 11/16/2023, 2:34 PM

## 2023-11-16 NOTE — Progress Notes (Signed)
   11/16/23 1603  Mobility  Activity Ambulated with assistance in hallway  Level of Assistance Contact guard assist, steadying assist  Assistive Device Front wheel walker  Distance Ambulated (ft) 50 ft  Activity Response Tolerated fair  Mobility Referral Yes  Mobility visit 1 Mobility  Mobility Specialist Start Time (ACUTE ONLY) 1547  Mobility Specialist Stop Time (ACUTE ONLY) 1603  Mobility Specialist Time Calculation (min) (ACUTE ONLY) 16 min   Mobility Specialist: Progress Note  Pre-Mobility:      HR 84, SpO2 96% RA Post-Mobility:    HR 86, SpO2 98% RA  Pt agreeable to mobility session - received in chair. C/o "feeling tired" throughout, requiring x4 standing breaks. Returned to chair with all needs met - call bell within reach.   Barnie Mort, BS Mobility Specialist Please contact via SecureChat or  Rehab office at [phone].

## 2023-11-16 NOTE — Progress Notes (Signed)
 Patient ID: [name], female   DOB: 1956/06/16, 68 y.o.   MRN: [id number] [facility] Progress Note   Assessment/ Plan:   1.  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  After initial refusal of wanting to pursue renal replacement therapy, she changed her mind and asked to start dialysis with the intention of undergoing surgery for contained perforation of acute diverticulitis.   -Maintain dialysis on MWF schedule -TDC w/ IR on 2/18, appreciate help -Some low flows on catheter 2/21.  TPA provided -Palliative care has been involved and patient wishes to continue with dialysis for now.   -CLIP in process; likely [facility].  Probably going to CIR. -Would hold on permanent access creation for now given overall clinical picture 2.  Acute diverticulitis with contained perforation of descending colon: On Zosyn and now status post exploratory laparotomy with Hartman's resection for perforated diverticulitis/peritonitis (2/8).  Management per surgery and primary team 3.  Anion gap metabolic acidosis: Resolved with renal replacement therapy 4.  Anemia: Has received blood and IV iron. ESA also ordered 5. HTN: BP up and down, increase UF with HD, cont current meds.  More aggressive UF. Consider intensifying BP regimen 6. Dispo: eval for CIR.  Subjective:    Feels well. Some upset stomach yesterday but no other concerns   Objective:   BP (!) 152/64 (BP Location: Right Arm)   Pulse 77   Temp 98.7 F (37.1 C) (Oral)   Resp 18   Ht 5' 7.01" (1.702 m)   Wt 90.4 kg   SpO2 100%   BMI 31.21 kg/m   Intake/Output Summary (Last 24 hours) at 11/16/2023 1022 Last data filed at 11/16/2023 0700 Gross per 24 hour  Intake --  Output 6450 ml  Net -6450 ml   Weight change:   Physical Exam: Gen: Lying in bed in no distress CVS: Normal rate,  no rub Resp: Bilateral chest rise with no increased work of breathing Abd: Minimal distention, colostomy bag in place Ext: Trace ankle edema.  Warm and well-perfused  Imaging: No results found.     Labs: BMET Recent Labs  Lab 11/10/23 0139 11/11/23 0327 11/13/23 0052 11/14/23 0354 11/15/23 0806  NA 137 137 136 135 136  K 3.4* 3.7 4.0 3.7 3.5  CL 99 102 100 97* 99  CO2 24 22 24 26 24   GLUCOSE 122* 161* 128* 152* 96  BUN 45* 55* 39* 25* 30*  CREATININE 6.44* 7.89* 7.04* 5.39* 7.20*  CALCIUM 7.3* 7.5* 7.6* 7.7* 7.8*  PHOS 2.3* 2.2*  --   --  4.9*   CBC Recent Labs  Lab 11/10/23 0139 11/11/23 0327 11/12/23 0420 11/13/23 0052 11/14/23 0354 11/15/23 0806  WBC 21.5* 22.5* 22.0* 18.8* 15.8* 13.5*  NEUTROABS 15.9* 18.9*  --   --   --   --   HGB 7.9* 8.0* 8.0* 7.6* 8.2* 8.6*  HCT 24.5* 24.8* 24.9* 23.9* 26.0* 26.7*  MCV 82.8 83.2 83.0 83.9 84.1 84.2  PLT 300 331 326 326 306 325    Medications:     amLODipine  5 mg Oral Daily   apixaban  5 mg Oral BID   carvedilol  6.25 mg Oral BID WC   Chlorhexidine Gluconate Cloth  6 each Topical Q0600   darbepoetin (ARANESP) injection - DIALYSIS  60 mcg Subcutaneous Q Mon-1800   insulin aspart  0-15 Units Subcutaneous TID WC  insulin aspart  0-5 Units Subcutaneous QHS   insulin glargine-yfgn  12 Units Subcutaneous Daily   mouth rinse  15 mL Mouth Rinse 4 times per day   pantoprazole  40 mg Oral BID   sodium chloride flush  10-40 mL Intracatheter Q12H   Darnell Level  11/16/2023, 10:22 AM

## 2023-11-16 NOTE — Progress Notes (Signed)
 CVC was removed per order with no complications.  Pt supine and suspended inspiration during removal with instructions to remain supine for 30 minutes after removal.  Pressure held to achieve hemostasis.  Vaseline/gauze/tegaderm applied.  Patient education provided regarding lifting restrictions, site care, and signs of infection.  Pt verbalized understanding and had no questions.

## 2023-11-16 NOTE — Progress Notes (Signed)
 PROGRESS NOTE        PATIENT DETAILS Name: [name] Age: 68 y.o. Sex: female Date of Birth: 11-Mar-1956 Admit Date: 10/27/2023 Admitting Physician Dolly Rias, MD ZOX:[id number], Devonne Doughty, NP  Brief Summary: Patient is a 68 y.o.  female with history of CKD 5, A-fib on Eliquis, CAD-who presented with abdominal pain-found to have acute diverticulitis with contained perforation involving the descending colon.  Hospital course complicated by worsening AKI-thought to have progressed to ESRD-requiring initiation of HD.  See below for further details.  Significant events: 2/2>> admit to [facility] 2/6>> CT abdomen worsening diverticulitis with perforation 2/7>> transitioned to full comfort measures-as with worsening AKI-however multiple family members arrived-comfort care revoked-now full code-full scope of treatment including HD/laparotomy with colostomy.  General surgery/nephrology reconsulted.  IR placed [facility] and patient underwent first hemodialysis.  2/8>> remains n.p.o-colectomy/colostomy later today. 2/20>> wound VAC reapplied  Significant studies: 2/2>> CT abdomen/pelvis: Acute diverticulitis-descending colon with contained perforation 2/6>> CT abdomen/pelvis: Worsening diverticulitis of descending colon with perforation and increasing failure-suspected oral contrast extravasation left abdomen.  Significant microbiology data: 2/2>> COVID/influenza/RSV PCR: Negative  Procedures: 2/8>> by Dr. Freida Busman underwent exploratory laparotomy with Hartman's resection for perforated diverticulitis with diffuse peritonitis, colostomy in place. 2/13 - R IJ Trialysis catheter - IR 2/14 -left IJ Central line placed by IR for TNA 11/12/2023. R.internal jugular tunneled HD catheter placement by IR  Consults: Surgery Renal Palliative IR  Subjective:   Significant events overnight, she denies any complaints today   Objective: Vitals: Blood pressure 131/60, pulse 79,  temperature 98.3 F (36.8 C), temperature source Oral, resp. rate 18, height 5' 7.01" (1.702 m), weight 90.4 kg, SpO2 99%.   Exam:  Awake Alert, Oriented X 3, No new F.N deficits, Normal affect Symmetrical Chest wall movement, Good air movement bilaterally, CTAB RRR,No Gallops,Rubs or new Murmurs, No Parasternal Heave +ve B.Sounds, Abd Soft, bland surgical wound covered with wound VAC, left lower abdomen colostomy with liquid stool present No Cyanosis, Clubbing or edema, No new Rash or bruise     Right upper chest tunneled HD catheter, left upper chest CVC line.  Discontinue today   Assessment/Plan:  Acute diverticulitis with contained perforation of the descending colon  -Management Per general surgery, status post Hartman's procedure by Dr. Freida Busman 11/02/2023. -Currently off TPN, appetite has improved, tolerating p.o. intake, DC IV erythromycin . -Wound care per general surgery, wound VAC t reapplied 2/20 -Wound care following for colostomy care  -IV Zosyn discontinued 2/20, nontoxic-appearing, leukocytosis trending down.    AKI on CKD 5 with progression to ESRD AKI likely hemodynamically mediated-has now likely progressed to ESRD.  HD initiated via right IJ temporary catheter, switched to right IJ trialysis catheter by IR on 11/07/2023>> tunneled HD catheter right IJ on 11/12/2023 by IR.   CAD Currently no anginal symptoms Suspect not on antiplatelets as on anticoagulation with Eliquis/heparin Continue beta-blocker On Repatha injections as an outpatient.  PAF Sinus rhythm Continue Coreg Currently on Eliquis  Anemia of acute illness  Anemia of end-stage renal disease Brought on by acute illness, underlying renal insufficiency now developing to ESRD, surgical blood loss, gradually trending down, 2 units of packed RBC on 11/04/2023, on IV PPI.  -Received 1 unit PRBC 2/19, continue to monitor and transfuse as needed -IV iron and Procrit per renal -Globin remained  stable  HTN Blood pressure better after  starting Norvasc, continue with home dose Coreg  Continue with as needed hydralazine -Should continue to improve with further hemodialysis DM-2 (A1c 6.6 on 2/3) Of TNA now tolerating oral diet, Semglee and sliding scale adjusted.   Recent Labs    11/15/23 2010 11/16/23 0759 11/16/23 1230  GLUCAP 159* 73 87       Palliative care/goals of care Palliative input greatly appreciated Class 1 Obesity: Estimated body mass index is 31.21 kg/m as calculated from the following:   Height as of this encounter: 5' 7.01" (1.702 m).   Weight as of this encounter: 90.4 kg.    Code status:   Code Status: Limited: Do not attempt resuscitation (DNR) -DNR-LIMITED -Do Not Intubate/DNI    DVT Prophylaxis: Place and maintain sequential compression device Start: 11/06/23 0613 apixaban (ELIQUIS) tablet 5 mg IV Heparin  Family Communication: Discussed with patient, all her questions answered.  Disposition Plan: Status is: Inpatient Will benefit from CIR, given multiple issues including her wound, will need frequent monitoring and observation by general surgery team, hemodialysis patient, she is on Eliquis she might need transfusions as well.  With her significant abdominal wound and wound VAC, significant deconditioning when she was independent at baseline, she is With prolonged hospital stay, surgery and significant weakness, will benefit from CIR stay to go back to baseline     Diet: Diet Order             Diet regular Room service appropriate? Yes; Fluid consistency: Thin  Diet effective now                   MEDICATIONS: Scheduled Meds:  amLODipine  5 mg Oral Daily   apixaban  5 mg Oral BID   carvedilol  6.25 mg Oral BID WC   Chlorhexidine Gluconate Cloth  6 each Topical Q0600   darbepoetin (ARANESP) injection - DIALYSIS  60 mcg Subcutaneous Q Mon-1800   insulin aspart  0-15 Units Subcutaneous TID WC   insulin aspart  0-5 Units  Subcutaneous QHS   insulin glargine-yfgn  12 Units Subcutaneous Daily   mouth rinse  15 mL Mouth Rinse 4 times per day   pantoprazole  40 mg Oral BID   sodium chloride flush  10-40 mL Intracatheter Q12H   Continuous Infusions:   PRN Meds:.acetaminophen **OR** acetaminophen, albuterol, hydrALAZINE, HYDROmorphone (DILAUDID) injection, menthol-cetylpyridinium, ondansetron (ZOFRAN) IV, mouth rinse, oxyCODONE   I have personally reviewed following labs and imaging studies  LABORATORY DATA:   Data Review:    Recent Labs  Lab 11/10/23 0139 11/11/23 0327 11/12/23 0420 11/13/23 0052 11/14/23 0354 11/15/23 0806  WBC 21.5* 22.5* 22.0* 18.8* 15.8* 13.5*  HGB 7.9* 8.0* 8.0* 7.6* 8.2* 8.6*  HCT 24.5* 24.8* 24.9* 23.9* 26.0* 26.7*  PLT 300 331 326 326 306 325  MCV 82.8 83.2 83.0 83.9 84.1 84.2  MCH 26.7 26.8 26.7 26.7 26.5 27.1  MCHC 32.2 32.3 32.1 31.8 31.5 32.2  RDW 16.1* 16.7* 16.7* 16.8* 16.5* 16.3*  LYMPHSABS 1.5 0.5*  --   --   --   --   MONOABS 3.9* 2.5*  --   --   --   --   EOSABS 0.2 0.7*  --   --   --   --   BASOSABS 0.0 0.0  --   --   --   --     Recent Labs  Lab 11/10/23 0139 11/11/23 0327 11/13/23 0052 11/14/23 0354 11/15/23 0806  NA 137 137 136 135 136  K 3.4* 3.7 4.0 3.7 3.5  CL 99 102 100 97* 99  CO2 24 22 24 26 24   ANIONGAP 14 13 12 12 13   GLUCOSE 122* 161* 128* 152* 96  BUN 45* 55* 39* 25* 30*  CREATININE 6.44* 7.89* 7.04* 5.39* 7.20*  AST  --  23  --   --   --   ALT  --  17  --   --   --   ALKPHOS  --  39  --   --   --   BILITOT  --  0.5  --   --   --   ALBUMIN  --  1.7*  --   --  1.9*  CRP 12.1*  --   --   --   --   PROCALCITON 15.86 12.67  --   --   --   MG 1.9 1.8  --   --   --   PHOS 2.3* 2.2*  --   --  4.9*  CALCIUM 7.3* 7.5* 7.6* 7.7* 7.8*      Recent Labs  Lab 11/10/23 0139 11/11/23 0327 11/13/23 0052 11/14/23 0354 11/15/23 0806  CRP 12.1*  --   --   --   --   PROCALCITON 15.86 12.67  --   --   --   MG 1.9 1.8  --   --   --    CALCIUM 7.3* 7.5* 7.6* 7.7* 7.8*    --------------------------------------------------------------------------------------------------------------- Lab Results  Component Value Date   CHOL 134 12/21/2019   HDL 49 12/21/2019   LDLCALC 69 12/21/2019   TRIG 148 11/11/2023   CHOLHDL 2.7 12/21/2019    Lab Results  Component Value Date   HGBA1C 6.6 (H) 10/28/2023      Micro Results Recent Results (from the past 240 hours)  MRSA Next Gen by PCR, Nasal     Status: None   Collection Time: 11/10/23  5:14 AM   Specimen: Nasal Mucosa; Nasal Swab  Result Value Ref Range Status   MRSA by PCR Next Gen NOT DETECTED NOT DETECTED Final    Comment: (NOTE) The GeneXpert MRSA Assay (FDA approved for NASAL specimens only), is one component of a comprehensive MRSA colonization surveillance program. It is not intended to diagnose MRSA infection nor to guide or monitor treatment for MRSA infections. Test performance is not FDA approved in patients less than 46 years old. Performed at [facility], [address], [place], [place] [address]      Radiology Reports IR Fluoro Guide CV Line Right Result Date: 11/12/2023 CLINICAL DATA:  End-stage renal disease and status post placement of non tunneled temporary dialysis catheter on 11/01/2023. The patient now requires placement of a tunneled dialysis catheter for further longer-term hemodialysis. EXAM: TUNNELED CENTRAL VENOUS HEMODIALYSIS CATHETER PLACEMENT WITH ULTRASOUND AND FLUOROSCOPIC GUIDANCE ANESTHESIA/SEDATION: Moderate (conscious) sedation was employed during this procedure. A total of Versed 1.0 mg and Fentanyl 50 mcg was administered intravenously. Moderate Sedation Time: 16 minutes. The patient's level of consciousness and vital signs were monitored continuously by radiology nursing throughout the procedure under my direct supervision. MEDICATIONS: The patient is receiving IV Rocephin every 8 hours in the hospital and did not receive  additional antibiotics for the procedure. FLUOROSCOPY: 24 seconds.  2.3 mGy. PROCEDURE: The procedure, risks, benefits, and alternatives were explained to the patient. Questions regarding the procedure were encouraged and answered. The patient understands and consents to the procedure. A timeout was performed prior to initiating the procedure.  A non tunneled temporary right-sided dialysis catheter was removed prior to the procedure and pressure held at the exit site. The right neck and chest were prepped with chlorhexidine in a sterile fashion, and a sterile drape was applied covering the operative field. Maximum barrier sterile technique with sterile gowns and gloves were used for the procedure. Local anesthesia was provided with 1% lidocaine. Ultrasound was used to confirm patency of the right internal jugular vein. An ultrasound image was saved and recorded. After creating a small venotomy incision, a 21 gauge needle was advanced into the right internal jugular vein under direct, real-time ultrasound guidance. Ultrasound image documentation was performed. After securing guidewire access, an 8 Fr dilator was placed. A J-wire was kinked to measure appropriate catheter length. A Palindrome tunneled hemodialysis catheter measuring 19 cm from tip to cuff was chosen for placement. This was tunneled in a retrograde fashion from the chest wall to the venotomy incision. At the venotomy, serial dilatation was performed and a 15 Fr peel-away sheath was placed over a guidewire. The catheter was then placed through the sheath and the sheath removed. Final catheter positioning was confirmed and documented with a fluoroscopic spot image. The catheter was aspirated, flushed with saline, and injected with appropriate volume heparin dwells. The venotomy incision was closed with subcuticular 4-0 Vicryl. Dermabond was applied to the incision. The catheter exit site was secured with 0-Prolene retention sutures. COMPLICATIONS: None.   No pneumothorax. FINDINGS: After catheter placement, the tip lies in the right atrium. The catheter aspirates normally and is ready for immediate use. IMPRESSION: Placement of tunneled hemodialysis catheter via new puncture of the right internal jugular vein after removal of the non tunneled catheter. The new catheter tip lies in the right atrium. The catheter is ready for immediate use. Electronically Signed   By: Irish Lack M.D.   On: 11/12/2023 10:45   IR US Guide Vasc Access Right Result Date: 11/12/2023 CLINICAL DATA:  End-stage renal disease and status post placement of non tunneled temporary dialysis catheter on 11/01/2023. The patient now requires placement of a tunneled dialysis catheter for further longer-term hemodialysis. EXAM: TUNNELED CENTRAL VENOUS HEMODIALYSIS CATHETER PLACEMENT WITH ULTRASOUND AND FLUOROSCOPIC GUIDANCE ANESTHESIA/SEDATION: Moderate (conscious) sedation was employed during this procedure. A total of Versed 1.0 mg and Fentanyl 50 mcg was administered intravenously. Moderate Sedation Time: 16 minutes. The patient's level of consciousness and vital signs were monitored continuously by radiology nursing throughout the procedure under my direct supervision. MEDICATIONS: The patient is receiving IV Rocephin every 8 hours in the hospital and did not receive additional antibiotics for the procedure. FLUOROSCOPY: 24 seconds.  2.3 mGy. PROCEDURE: The procedure, risks, benefits, and alternatives were explained to the patient. Questions regarding the procedure were encouraged and answered. The patient understands and consents to the procedure. A timeout was performed prior to initiating the procedure. A non tunneled temporary right-sided dialysis catheter was removed prior to the procedure and pressure held at the exit site. The right neck and chest were prepped with chlorhexidine in a sterile fashion, and a sterile drape was applied covering the operative field. Maximum barrier sterile  technique with sterile gowns and gloves were used for the procedure. Local anesthesia was provided with 1% lidocaine. Ultrasound was used to confirm patency of the right internal jugular vein. An ultrasound image was saved and recorded. After creating a small venotomy incision, a 21 gauge needle was advanced into the right internal jugular vein under direct, real-time ultrasound guidance. Ultrasound image documentation was  performed. After securing guidewire access, an 8 Fr dilator was placed. A J-wire was kinked to measure appropriate catheter length. A Palindrome tunneled hemodialysis catheter measuring 19 cm from tip to cuff was chosen for placement. This was tunneled in a retrograde fashion from the chest wall to the venotomy incision. At the venotomy, serial dilatation was performed and a 15 Fr peel-away sheath was placed over a guidewire. The catheter was then placed through the sheath and the sheath removed. Final catheter positioning was confirmed and documented with a fluoroscopic spot image. The catheter was aspirated, flushed with saline, and injected with appropriate volume heparin dwells. The venotomy incision was closed with subcuticular 4-0 Vicryl. Dermabond was applied to the incision. The catheter exit site was secured with 0-Prolene retention sutures. COMPLICATIONS: None.  No pneumothorax. FINDINGS: After catheter placement, the tip lies in the right atrium. The catheter aspirates normally and is ready for immediate use. IMPRESSION: Placement of tunneled hemodialysis catheter via new puncture of the right internal jugular vein after removal of the non tunneled catheter. The new catheter tip lies in the right atrium. The catheter is ready for immediate use. Electronically Signed   By: Irish Lack M.D.   On: 11/12/2023 10:45   IR US Guide Vasc Access Left Result Date: 11/08/2023 INDICATION: ESRD.  IV antibiotics. EXAM: ULTRASOUND AND FLUOROSCOPIC GUIDED PLACEMENT OF TUNNELED CENTRAL VENOUS  "Powerline" CATHETER MEDICATIONS: The patient was on scheduled IV antibiotics. ANESTHESIA/SEDATION: Local anesthetic was administered. The patient was continuously monitored during the procedure by the interventional radiology nurse under my direct supervision. FLUOROSCOPY TIME:  Fluoroscopic dose; 3.8 mGy COMPLICATIONS: None immediate. PROCEDURE: Informed written consent was obtained from the patient and/or patient's representative after a discussion of the risks, benefits, and alternatives to treatment. Questions regarding the procedure were encouraged and answered. The LEFT neck and chest were prepped with chlorhexidine in a sterile fashion, and a sterile drape was applied covering the operative field. Maximum barrier sterile technique with sterile gowns and gloves were used for the procedure. A timeout was performed prior to the initiation of the procedure. After the overlying soft tissues were anesthetized, a small venotomy incision was created and a micropuncture kit was utilized to access the internal jugular vein. Real-time ultrasound guidance was utilized for vascular access including the acquisition of a permanent ultrasound image documenting patency of the accessed vessel. The microwire was utilized to measure appropriate catheter length. The micropuncture sheath was exchanged for a peel-away sheath over a guidewire. A 5 Fr dual lumen tunneled central venous catheter measuring 33 cm was tunneled in a retrograde fashion from the anterior chest wall to the venotomy incision. The catheter was then placed through the peel-away sheath with tip ultimately positioned at the superior caval-atrial junction. Final catheter positioning was confirmed and documented with a spot radiographic image. The catheter aspirates and flushes normally. The catheter was flushed with appropriate volume heparin dwells. The catheter exit site was secured with a 3-0 Ethilon retention suture. The venotomy incision was closed with  Dermabond. Dressings were applied. The patient tolerated the procedure well without immediate post procedural complication. FINDINGS: After catheter placement, the tip lies within the superior apect of the right atrium. The catheter aspirates and flushes normally and is ready for immediate use. IMPRESSION: Successful placement of 33 cm dual lumen tunneled "Powerline" central venous catheter via the LEFT internal jugular vein The tip of the catheter is positioned within the proximal RIGHT atrium. The catheter is ready for immediate use. Roanna Banning, MD  [facility] Radiology Electronically Signed   By: Roanna Banning M.D.   On: 11/08/2023 18:10   IR Fluoro Guide CV Line Left Result Date: 11/08/2023 INDICATION: ESRD.  IV antibiotics. EXAM: ULTRASOUND AND FLUOROSCOPIC GUIDED PLACEMENT OF TUNNELED CENTRAL VENOUS "Powerline" CATHETER MEDICATIONS: The patient was on scheduled IV antibiotics. ANESTHESIA/SEDATION: Local anesthetic was administered. The patient was continuously monitored during the procedure by the interventional radiology nurse under my direct supervision. FLUOROSCOPY TIME:  Fluoroscopic dose; 3.8 mGy COMPLICATIONS: None immediate. PROCEDURE: Informed written consent was obtained from the patient and/or patient's representative after a discussion of the risks, benefits, and alternatives to treatment. Questions regarding the procedure were encouraged and answered. The LEFT neck and chest were prepped with chlorhexidine in a sterile fashion, and a sterile drape was applied covering the operative field. Maximum barrier sterile technique with sterile gowns and gloves were used for the procedure. A timeout was performed prior to the initiation of the procedure. After the overlying soft tissues were anesthetized, a small venotomy incision was created and a micropuncture kit was utilized to access the internal jugular vein. Real-time ultrasound guidance was utilized  for vascular access including the acquisition of a permanent ultrasound image documenting patency of the accessed vessel. The microwire was utilized to measure appropriate catheter length. The micropuncture sheath was exchanged for a peel-away sheath over a guidewire. A 5 Fr dual lumen tunneled central venous catheter measuring 33 cm was tunneled in a retrograde fashion from the anterior chest wall to the venotomy incision. The catheter was then placed through the peel-away sheath with tip ultimately positioned at the superior caval-atrial junction. Final catheter positioning was confirmed and documented with a spot radiographic image. The catheter aspirates and flushes normally. The catheter was flushed with appropriate volume heparin dwells. The catheter exit site was secured with a 3-0 Ethilon retention suture. The venotomy incision was closed with Dermabond. Dressings were applied. The patient tolerated the procedure well without immediate post procedural complication. FINDINGS: After catheter placement, the tip lies within the superior apect of the right atrium. The catheter aspirates and flushes normally and is ready for immediate use. IMPRESSION: Successful placement of 33 cm dual lumen tunneled "Powerline" central venous catheter via the LEFT internal jugular vein The tip of the catheter is positioned within the proximal RIGHT atrium. The catheter is ready for immediate use. Roanna Banning, MD [facility] Radiology Electronically Signed   By: Roanna Banning M.D.   On: 11/08/2023 18:10   VAS [place] LOWER EXTREMITY VENOUS (DVT) Result Date: 11/07/2023  Lower Venous DVT Study Patient Name:  GIDGET QUIZHPI [facility]  Date of Exam:   11/07/2023 Medical Rec #: [id number]          Accession #:    [id number] Date of Birth: 1956-01-06          Patient Gender: F Patient Age:   68 years Exam [facility]  [facility] Procedure:      VAS [place] LOWER EXTREMITY VENOUS (DVT) Referring  Phys: Tresa Endo OSBORNE --------------------------------------------------------------------------------  Indications: Pain, stroke, and A-fib, anemia.  Comparison Study: No prior exam. Performing Technologist: Fernande Bras  Examination Guidelines: A complete evaluation includes B-mode imaging, spectral Doppler, color Doppler, and power Doppler as needed of all accessible portions of each vessel. Bilateral testing is considered an integral part of a complete examination. Limited examinations for reoccurring indications may be performed as noted. The reflux portion of the exam is performed with the patient  in reverse Trendelenburg.  +---------+---------------+---------+-----------+----------+--------------+ RIGHT    CompressibilityPhasicitySpontaneityPropertiesThrombus Aging +---------+---------------+---------+-----------+----------+--------------+ CFV      Full           Yes      Yes                                 +---------+---------------+---------+-----------+----------+--------------+ SFJ      Full           Yes      Yes                                 +---------+---------------+---------+-----------+----------+--------------+ FV Prox  Full                                                        +---------+---------------+---------+-----------+----------+--------------+ FV Mid   Full                                                        +---------+---------------+---------+-----------+----------+--------------+ FV DistalFull                                                        +---------+---------------+---------+-----------+----------+--------------+ PFV      Full                                                        +---------+---------------+---------+-----------+----------+--------------+ POP      Full           Yes      Yes                                 +---------+---------------+---------+-----------+----------+--------------+ PTV       Full                                                        +---------+---------------+---------+-----------+----------+--------------+ PERO     Full                                                        +---------+---------------+---------+-----------+----------+--------------+   +---------+---------------+---------+-----------+----------+--------------+ LEFT     CompressibilityPhasicitySpontaneityPropertiesThrombus Aging +---------+---------------+---------+-----------+----------+--------------+ CFV      Full           Yes      Yes                                 +---------+---------------+---------+-----------+----------+--------------+  SFJ      Full           Yes      Yes                                 +---------+---------------+---------+-----------+----------+--------------+ FV Prox  Full                                                        +---------+---------------+---------+-----------+----------+--------------+ FV Mid   Full                                                        +---------+---------------+---------+-----------+----------+--------------+ FV DistalFull                                                        +---------+---------------+---------+-----------+----------+--------------+ PFV      Full                                                        +---------+---------------+---------+-----------+----------+--------------+ POP      Full           Yes      Yes                                 +---------+---------------+---------+-----------+----------+--------------+ PTV      Full                                                        +---------+---------------+---------+-----------+----------+--------------+ PERO     Full                                                        +---------+---------------+---------+-----------+----------+--------------+     Summary: BILATERAL: - No evidence of deep vein  thrombosis seen in the lower extremities, bilaterally. -No evidence of popliteal cyst, bilaterally.   *See table(s) above for measurements and observations. Electronically signed by Heath Lark on 11/07/2023 at 4:05:14 PM.    Final    CT ABDOMEN PELVIS WO CONTRAST Result Date: 11/06/2023 CLINICAL DATA:  Diverticulitis, complication suspected EXAM: CT ABDOMEN AND PELVIS WITHOUT CONTRAST TECHNIQUE: Multidetector CT imaging of the abdomen and pelvis was performed following the standard protocol without IV contrast. RADIATION DOSE REDUCTION: This exam was performed according to the departmental dose-optimization program which includes automated exposure control, adjustment of the mA and/or kV according to patient size  and/or use of iterative reconstruction technique. COMPARISON:  10/31/2023 FINDINGS: Lower chest: Volume loss and opacity in the lower lungs consistent with atelectasis. There is a central line with tip at the upper right atrium. Coronary atherosclerosis. Hepatobiliary: No focal liver abnormality. Calcific in the intermediate density in the gallbladder attributed to stones. No biliary dilatation or pericholecystic edema. Pancreas: Unremarkable. Spleen: Unremarkable. Adrenals/Urinary Tract: Negative adrenals. No hydronephrosis or stone. Renal cortical thinning and lobulation at the left upper pole. 3 mm stone at the upper pole left kidney. The urinary bladder is collapsed around a Foley catheter. Stomach/Bowel: Descending colostomy. Bowel anastomosis in the right abdomen. No evidence of bowel obstruction. Fluid collection with loculation appearance/peritoneal thickening along the left flank measuring up to 5 x 6 x 10 cm. Milder fluid accumulation in the ventral peritoneal space. Vascular/Lymphatic: Extensive atheromatous calcification of the aorta and iliacs. No mass or adenopathy. Reproductive:No pathologic findings. Other: No ascites or pneumoperitoneum. Musculoskeletal: No acute abnormalities.  T9  butterfly vertebra IMPRESSION: 1. Interval colostomy with fluid collection along the left gutter, dominant pocket measuring 10 x 6 x 5 cm. 2. No bowel obstruction. 3. Cholelithiasis and left nephrolithiasis. Electronically Signed   By: Tiburcio Pea M.D.   On: 11/06/2023 07:12   DG Chest Port 1 View Result Date: 11/03/2023 CLINICAL DATA:  Shortness of breath EXAM: PORTABLE CHEST - 1 VIEW COMPARISON:  11/11/2018 FINDINGS: Unchanged mild cardiomegaly and pulmonary vascular congestion. Interval worsening of left basilar opacity likely due to atelectasis. Lungs otherwise clear. Examination is limited due to expiratory phase of imaging. Interval placement of temporary right IJ hemodialysis catheter which terminates in the region of the right atrium. Nasogastric tube terminates in the left upper quadrant. IMPRESSION: Interval worsening of left basilar opacity which is likely due to atelectasis. Electronically Signed   By: Acquanetta Belling M.D.   On: 11/03/2023 07:22   IR Fluoro Guide CV Line Right Result Date: 11/01/2023 INDICATION: ESRD requiring HD. EXAM: NON-TUNNELED CENTRAL VENOUS HEMODIALYSIS CATHETER PLACEMENT WITH ULTRASOUND AND FLUOROSCOPIC GUIDANCE COMPARISON:  CT AP 10/31/2023. MEDICATIONS: Local anesthetic was administered. FLUOROSCOPY TIME:  Fluoroscopic dose; 35 mGy COMPLICATIONS: None immediate. PROCEDURE: Informed written consent was obtained from the patient and/or patient's representative after a discussion of the risks, benefits, and alternatives to treatment. Questions regarding the procedure were encouraged and answered. The RIGHT neck and chest were prepped with chlorhexidine in a sterile fashion, and a sterile drape was applied covering the operative field. Maximum barrier sterile technique with sterile gowns and gloves were used for the procedure. A timeout was performed prior to the initiation of the procedure. After the overlying soft tissues were anesthetized, a small venotomy incision was  created and a micropuncture kit was utilized to access the external jugular vein. Real-time ultrasound guidance was utilized for vascular access including the acquisition of a permanent ultrasound image documenting patency of the accessed vessel. The microwire was utilized to measure appropriate catheter length. A stiff glidewire was advanced to the level of the IVC. Under fluoroscopic guidance, the venotomy was serially dilated, ultimately allowing placement of a 20 cm temporary Trialysis catheter with tip ultimately terminating within the superior aspect of the right atrium. Final catheter positioning was confirmed and documented with a spot radiographic image. The catheter aspirates and flushes normally. The catheter was flushed with appropriate volume heparin dwells. The catheter exit site was secured with a 2-0 Ethilon retention suture. A dressing was placed. The patient tolerated the procedure well without immediate post procedural complication. IMPRESSION: Successful  placement of a RIGHT EXTERNAL jugular approach 20 cm non-tunneled dialysis catheter The tip of the catheter is positioned within the proximal RIGHT atrium. The catheter is ready for immediate use. PLAN: This catheter may be converted to a tunneled dialysis catheter at a later date as indicated. Roanna Banning, MD [facility] Electronically Signed   By: Roanna Banning M.D.   On: 11/01/2023 18:15   IR US Guide Vasc Access Right Result Date: 11/01/2023 INDICATION: ESRD requiring HD. EXAM: NON-TUNNELED CENTRAL VENOUS HEMODIALYSIS CATHETER PLACEMENT WITH ULTRASOUND AND FLUOROSCOPIC GUIDANCE COMPARISON:  CT AP 10/31/2023. MEDICATIONS: Local anesthetic was administered. FLUOROSCOPY TIME:  Fluoroscopic dose; 35 mGy COMPLICATIONS: None immediate. PROCEDURE: Informed written consent was obtained from the patient and/or patient's representative after a discussion of the risks, benefits, and  alternatives to treatment. Questions regarding the procedure were encouraged and answered. The RIGHT neck and chest were prepped with chlorhexidine in a sterile fashion, and a sterile drape was applied covering the operative field. Maximum barrier sterile technique with sterile gowns and gloves were used for the procedure. A timeout was performed prior to the initiation of the procedure. After the overlying soft tissues were anesthetized, a small venotomy incision was created and a micropuncture kit was utilized to access the external jugular vein. Real-time ultrasound guidance was utilized for vascular access including the acquisition of a permanent ultrasound image documenting patency of the accessed vessel. The microwire was utilized to measure appropriate catheter length. A stiff glidewire was advanced to the level of the IVC. Under fluoroscopic guidance, the venotomy was serially dilated, ultimately allowing placement of a 20 cm temporary Trialysis catheter with tip ultimately terminating within the superior aspect of the right atrium. Final catheter positioning was confirmed and documented with a spot radiographic image. The catheter aspirates and flushes normally. The catheter was flushed with appropriate volume heparin dwells. The catheter exit site was secured with a 2-0 Ethilon retention suture. A dressing was placed. The patient tolerated the procedure well without immediate post procedural complication. IMPRESSION: Successful placement of a RIGHT EXTERNAL jugular approach 20 cm non-tunneled dialysis catheter The tip of the catheter is positioned within the proximal RIGHT atrium. The catheter is ready for immediate use. PLAN: This catheter may be converted to a tunneled dialysis catheter at a later date as indicated. Roanna Banning, MD [facility] Electronically Signed   By: Roanna Banning M.D.   On: 11/01/2023 18:15   CT ABDOMEN PELVIS WO  CONTRAST Result Date: 10/31/2023 CLINICAL DATA:  Diverticulitis, complication suspected. EXAM: CT ABDOMEN AND PELVIS WITHOUT CONTRAST TECHNIQUE: Multidetector CT imaging of the abdomen and pelvis was performed following the standard protocol without IV contrast. RADIATION DOSE REDUCTION: This exam was performed according to the departmental dose-optimization program which includes automated exposure control, adjustment of the mA and/or kV according to patient size and/or use of iterative reconstruction technique. COMPARISON:  10/27/2023. FINDINGS: Lower chest: Heart is enlarged and multi-vessel coronary artery calcifications are noted. Consolidation and patchy airspace disease is present at the lung bases bilaterally. Hepatobiliary: No focal liver abnormality is seen. Stones are present within the gallbladder. No biliary ductal dilatation. Pancreas: Unremarkable. No pancreatic ductal dilatation or surrounding inflammatory changes. Spleen: Normal in size without focal abnormality. Adrenals/Urinary Tract: The adrenal glands are within normal limits. Calcifications are present in the upper pole of the left kidney. There is a cyst in the lower pole of the right kidney. No hydronephrosis bilaterally. The bladder is  unremarkable. Stomach/Bowel: The stomach is within normal limits. No bowel obstruction or pneumatosis is seen. Right hemicolectomy changes are noted. There is diffuse colonic wall thickening. Scattered diverticula are present along the colon. A few small foci of free air are noted in the mesentery in the mid left abdomen, slightly increased from the previous exam. There is suggestion of contrast extravasation in the region. There is fine no definite abscess is seen. Vascular/Lymphatic: Aortic atherosclerosis. No enlarged abdominal or pelvic lymph nodes. Reproductive: No acute abnormality. Other: Mild ascites is noted in all 4 quadrants. A fat containing umbilical hernia is noted. There is mild anasarca.  Musculoskeletal: The bony structures are stable. IMPRESSION: 1. Worsening diverticulitis of the descending colon with perforation and increasing free air and suspected oral contrast extravasation in the mid left abdomen. No definite abscess is seen. Surgical consultation is recommended. 2. Mild ascites. 3. Diffuse colonic wall thickening which may be due to associated inflammatory changes. 4. Patchy airspace disease at the lung bases with bilateral lower lobe consolidation. 5. Cholelithiasis. 6. Aortic atherosclerosis. Critical Value/emergent results were called by telephone at the time of interpretation on 10/31/2023 at 11:59 pm to provider Dr. Antionette Char, who verbally acknowledged these results. Electronically Signed   By: Thornell Sartorius M.D.   On: 10/31/2023 23:59   CT ABDOMEN PELVIS WO CONTRAST Result Date: 10/27/2023 CLINICAL DATA:  Acute nonlocalized abdominal pain. Chills and fever. Vomiting and diarrhea. EXAM: CT ABDOMEN AND PELVIS WITHOUT CONTRAST TECHNIQUE: Multidetector CT imaging of the abdomen and pelvis was performed following the standard protocol without IV contrast. RADIATION DOSE REDUCTION: This exam was performed according to the departmental dose-optimization program which includes automated exposure control, adjustment of the mA and/or kV according to patient size and/or use of iterative reconstruction technique. COMPARISON:  CT abdomen and pelvis 04/19/2021 FINDINGS: Lower chest: Bibasilar atelectasis.  No acute abnormality. Hepatobiliary: Cholelithiasis without evidence of acute cholecystitis. No biliary dilation. Unremarkable noncontrast appearance of the liver. Pancreas: Unremarkable. Spleen: Unremarkable. Adrenals/Urinary Tract: Normal adrenal glands. No urinary calculi or hydronephrosis. Unremarkable bladder. Stomach/Bowel: Stomach is within normal limits. No bowel obstruction. Colonic diverticulosis. Wall thickening, inflammatory stranding, and free fluid about the descending colon. Loosely  organized fluid and gas about the inflamed descending colon (circa series 3/image [id number]) compatible with contained perforation. The adjacent jejunum is inflamed. Postoperative change about the colon with ileocolonic anastomosis in the right upper quadrant. Vascular/Lymphatic: Aortic atherosclerosis. No enlarged abdominal or pelvic lymph nodes. Reproductive: No acute abnormality. Other: Fat containing periumbilical hernia. Musculoskeletal: No acute fracture. IMPRESSION: 1. Acute diverticulitis of the descending colon with contained perforation. 2. Inflamed jejunum adjacent to the perforated diverticulitis. 3.  Aortic Atherosclerosis (ICD10-I70.0). Critical Value/emergent results were called by telephone at the time of interpretation on 10/27/2023 at 9:22 pm to provider [facility] , who verbally acknowledged these results. Electronically Signed   By: Minerva Fester M.D.   On: 10/27/2023 21:24    Signature  -   Huey Bienenstock M.D on 11/16/2023 at 2:21 PM   -  To page go to [email or web address]

## 2023-11-17 DIAGNOSIS — K5732 Diverticulitis of large intestine without perforation or abscess without bleeding: Secondary | ICD-10-CM | POA: Diagnosis not present

## 2023-11-17 DIAGNOSIS — N179 Acute kidney failure, unspecified: Secondary | ICD-10-CM | POA: Diagnosis not present

## 2023-11-17 LAB — GLUCOSE, CAPILLARY
Glucose-Capillary: 117 mg/dL — ABNORMAL HIGH (ref 70–99)
Glucose-Capillary: 142 mg/dL — ABNORMAL HIGH (ref 70–99)
Glucose-Capillary: 101 mg/dL — ABNORMAL HIGH (ref 70–99)
Glucose-Capillary: 214 mg/dL — ABNORMAL HIGH (ref 70–99)
Glucose-Capillary: 223 mg/dL — ABNORMAL HIGH (ref 70–99)

## 2023-11-17 NOTE — Progress Notes (Signed)
   11/17/23 1150  Mobility  Activity Ambulated with assistance in hallway  Level of Assistance Minimal assist, patient does 75% or more  Assistive Device Front wheel walker  Distance Ambulated (ft) 75 ft  Activity Response Tolerated fair  Mobility Referral Yes  Mobility visit 1 Mobility  Mobility Specialist Start Time (ACUTE ONLY) 1120  Mobility Specialist Stop Time (ACUTE ONLY) 1150  Mobility Specialist Time Calculation (min) (ACUTE ONLY) 30 min   Mobility Specialist: Progress Note  Pre-Mobility:      HR  79, SpO2 96% RA Post-Mobility:    HR 80  Pt agreeable to mobility session - received in bed. C/o "my stomach don't feel good." Required one seated break during ambulation. Returned to chair with all needs met - call bell within reach. Chair alarm on.  Barnie Mort, BS Mobility Specialist Please contact via SecureChat or  Rehab office at [phone].

## 2023-11-17 NOTE — Progress Notes (Signed)
 PROGRESS NOTE        PATIENT DETAILS Name: [name] Age: 68 y.o. Sex: female Date of Birth: Sep 30, 1955 Admit Date: 10/27/2023 Admitting Physician Dolly Rias, MD VHQ:IONGE, Devonne Doughty, NP  Brief Summary: Patient is a 68 y.o.  female with history of CKD 5, A-fib on Eliquis, CAD-who presented with abdominal pain-found to have acute diverticulitis with contained perforation involving the descending colon.  Hospital course complicated by worsening AKI-thought to have progressed to ESRD-requiring initiation of HD.  See below for further details.  Significant events: 2/2>> admit to [facility] 2/6>> CT abdomen worsening diverticulitis with perforation 2/7>> transitioned to full comfort measures-as with worsening AKI-however multiple family members arrived-comfort care revoked-now full code-full scope of treatment including HD/laparotomy with colostomy.  General surgery/nephrology reconsulted.  IR placed [facility] and patient underwent first hemodialysis.  2/8>> remains n.p.o-colectomy/colostomy later today. 2/20>> wound VAC reapplied  Significant studies: 2/2>> CT abdomen/pelvis: Acute diverticulitis-descending colon with contained perforation 2/6>> CT abdomen/pelvis: Worsening diverticulitis of descending colon with perforation and increasing failure-suspected oral contrast extravasation left abdomen.  Significant microbiology data: 2/2>> COVID/influenza/RSV PCR: Negative  Procedures: 2/8>> by Dr. Freida Busman underwent exploratory laparotomy with Hartman's resection for perforated diverticulitis with diffuse peritonitis, colostomy in place. 2/13 - R IJ Trialysis catheter - IR 2/14 -left IJ Central line placed by IR for TNA(removed on 2/22) 11/12/2023. R.internal jugular tunneled HD catheter placement by IR  Consults: Surgery Renal Palliative IR  Subjective:   No significant events overnight, she denies any complaints today, her left IJ CVC discontinued  yesterday   Objective: Vitals: Blood pressure (!) 132/58, pulse 78, temperature 98 F (36.7 C), temperature source Oral, resp. rate 15, height 5' 7.01" (1.702 m), weight 90.4 kg, SpO2 91%.   Exam:  Awake Alert, Oriented X 3, No new F.N deficits, Normal affect Symmetrical Chest wall movement, Good air movement bilaterally, CTAB RRR,No Gallops,Rubs or new Murmurs, No Parasternal Heave   +ve B.Sounds, Abd Soft, bland surgical wound covered with wound VAC, left lower abdomen colostomy with liquid stool present No Cyanosis, Clubbing or edema, No new Rash or bruise     Right upper chest tunneled HD catheter, left upper chest CVC line removed yesterday.    Assessment/Plan:  Acute diverticulitis with contained perforation of the descending colon  -Management Per general surgery, status post Hartman's procedure by Dr. Freida Busman 11/02/2023. -Currently off TPN, appetite has improved, tolerating p.o. intake, DC IV erythromycin . -Wound care per general surgery, wound VAC t reapplied 2/20 -Wound care following for colostomy care  -IV Zosyn discontinued 2/20, nontoxic-appearing, leukocytosis trending down.    AKI on CKD 5 with progression to ESRD AKI likely hemodynamically mediated-has now likely progressed to ESRD.  HD initiated via right IJ temporary catheter, switched to right IJ trialysis catheter by IR on 11/07/2023>> tunneled HD catheter right IJ on 11/12/2023 by IR. -CLIP in process, but likely she will be going to CIR, would like to hold on permanent access creation for now given her overall clinical picture.   CAD Currently no anginal symptoms Suspect not on antiplatelets as on anticoagulation with Eliquis/heparin Continue beta-blocker On Repatha injections as an outpatient.  PAF Sinus rhythm Continue Coreg Currently on Eliquis  Anemia of acute illness  Anemia of end-stage renal disease Brought on by acute illness, underlying renal insufficiency now developing to ESRD, surgical  blood loss, gradually trending down,  2 units of packed RBC on 11/04/2023, on IV PPI.  -Received 1 unit PRBC 2/19, continue to monitor and transfuse as needed -IV iron and Procrit per renal -Hemoglobin stable, transfuse as needed, monitor closely as she is on Eliquis.  HTN Blood pressure better after starting Norvasc, continue with home dose Coreg  Continue with as needed hydralazine -Should continue to improve with further hemodialysis  DM-2 (A1c 6.6 on 2/3) OffTNA now tolerating oral diet, Semglee and sliding scale adjusted.   Recent Labs    11/16/23 2136 11/17/23 0811 11/17/23 1154  GLUCAP 125* 117* 142*       Palliative care/goals of care Palliative input greatly appreciated Class 1 Obesity: Estimated body mass index is 31.21 kg/m as calculated from the following:   Height as of this encounter: 5' 7.01" (1.702 m).   Weight as of this encounter: 90.4 kg.    Code status:   Code Status: Limited: Do not attempt resuscitation (DNR) -DNR-LIMITED -Do Not Intubate/DNI    DVT Prophylaxis: Place and maintain sequential compression device Start: 11/06/23 0613 apixaban (ELIQUIS) tablet 5 mg IV Heparin  Family Communication: Discussed with patient, all her questions answered.  Disposition Plan: Status is: Inpatient Will benefit from CIR, given multiple issues including her wound, will need frequent monitoring and observation by general surgery team, hemodialysis patient, she is on Eliquis she might need transfusions as well.  With her significant abdominal wound and wound VAC, significant deconditioning when she was independent at baseline, she is With prolonged hospital stay, surgery and significant weakness, will benefit from CIR stay to go back to baseline     Diet: Diet Order             Diet regular Room service appropriate? Yes; Fluid consistency: Thin  Diet effective now                   MEDICATIONS: Scheduled Meds:  amLODipine  5 mg Oral Daily   apixaban  5  mg Oral BID   carvedilol  6.25 mg Oral BID WC   Chlorhexidine Gluconate Cloth  6 each Topical Q0600   darbepoetin (ARANESP) injection - DIALYSIS  60 mcg Subcutaneous Q Mon-1800   insulin aspart  0-15 Units Subcutaneous TID WC   insulin aspart  0-5 Units Subcutaneous QHS   insulin glargine-yfgn  12 Units Subcutaneous Daily   mouth rinse  15 mL Mouth Rinse 4 times per day   pantoprazole  40 mg Oral BID   sodium chloride flush  10-40 mL Intracatheter Q12H   Continuous Infusions:   PRN Meds:.acetaminophen **OR** acetaminophen, albuterol, hydrALAZINE, HYDROmorphone (DILAUDID) injection, menthol-cetylpyridinium, ondansetron (ZOFRAN) IV, mouth rinse, oxyCODONE   I have personally reviewed following labs and imaging studies  LABORATORY DATA:   Data Review:    Recent Labs  Lab 11/11/23 0327 11/12/23 0420 11/13/23 0052 11/14/23 0354 11/15/23 0806  WBC 22.5* 22.0* 18.8* 15.8* 13.5*  HGB 8.0* 8.0* 7.6* 8.2* 8.6*  HCT 24.8* 24.9* 23.9* 26.0* 26.7*  PLT 331 326 326 306 325  MCV 83.2 83.0 83.9 84.1 84.2  MCH 26.8 26.7 26.7 26.5 27.1  MCHC 32.3 32.1 31.8 31.5 32.2  RDW 16.7* 16.7* 16.8* 16.5* 16.3*  LYMPHSABS 0.5*  --   --   --   --   MONOABS 2.5*  --   --   --   --   EOSABS 0.7*  --   --   --   --   BASOSABS 0.0  --   --   --   --  Recent Labs  Lab 11/11/23 0327 11/13/23 0052 11/14/23 0354 11/15/23 0806  NA 137 136 135 136  K 3.7 4.0 3.7 3.5  CL 102 100 97* 99  CO2 22 24 26 24   ANIONGAP 13 12 12 13   GLUCOSE 161* 128* 152* 96  BUN 55* 39* 25* 30*  CREATININE 7.89* 7.04* 5.39* 7.20*  AST 23  --   --   --   ALT 17  --   --   --   ALKPHOS 39  --   --   --   BILITOT 0.5  --   --   --   ALBUMIN 1.7*  --   --  1.9*  PROCALCITON 12.67  --   --   --   MG 1.8  --   --   --   PHOS 2.2*  --   --  4.9*  CALCIUM 7.5* 7.6* 7.7* 7.8*      Recent Labs  Lab 11/11/23 0327 11/13/23 0052 11/14/23 0354 11/15/23 0806  PROCALCITON 12.67  --   --   --   MG 1.8  --   --   --    CALCIUM 7.5* 7.6* 7.7* 7.8*    --------------------------------------------------------------------------------------------------------------- Lab Results  Component Value Date   CHOL 134 12/21/2019   HDL 49 12/21/2019   LDLCALC 69 12/21/2019   TRIG 148 11/11/2023   CHOLHDL 2.7 12/21/2019    Lab Results  Component Value Date   HGBA1C 6.6 (H) 10/28/2023      Micro Results Recent Results (from the past 240 hours)  MRSA Next Gen by PCR, Nasal     Status: None   Collection Time: 11/10/23  5:14 AM   Specimen: Nasal Mucosa; Nasal Swab  Result Value Ref Range Status   MRSA by PCR Next Gen NOT DETECTED NOT DETECTED Final    Comment: (NOTE) The GeneXpert MRSA Assay (FDA approved for NASAL specimens only), is one component of a comprehensive MRSA colonization surveillance program. It is not intended to diagnose MRSA infection nor to guide or monitor treatment for MRSA infections. Test performance is not FDA approved in patients less than 41 years old. Performed at [facility], [address] [place], [place] [address]      Radiology Reports IR Fluoro Guide CV Line Right Result Date: 11/12/2023 CLINICAL DATA:  End-stage renal disease and status post placement of non tunneled temporary dialysis catheter on 11/01/2023. The patient now requires placement of a tunneled dialysis catheter for further longer-term hemodialysis. EXAM: TUNNELED CENTRAL VENOUS HEMODIALYSIS CATHETER PLACEMENT WITH ULTRASOUND AND FLUOROSCOPIC GUIDANCE ANESTHESIA/SEDATION: Moderate (conscious) sedation was employed during this procedure. A total of Versed 1.0 mg and Fentanyl 50 mcg was administered intravenously. Moderate Sedation Time: 16 minutes. The patient's level of consciousness and vital signs were monitored continuously by radiology nursing throughout the procedure under my direct supervision. MEDICATIONS: The patient is receiving IV Rocephin every 8 hours in the hospital and did not receive  additional antibiotics for the procedure. FLUOROSCOPY: 24 seconds.  2.3 mGy. PROCEDURE: The procedure, risks, benefits, and alternatives were explained to the patient. Questions regarding the procedure were encouraged and answered. The patient understands and consents to the procedure. A timeout was performed prior to initiating the procedure. A non tunneled temporary right-sided dialysis catheter was removed prior to the procedure and pressure held at the exit site. The right neck and chest were prepped with chlorhexidine in a sterile fashion, and a sterile drape was applied covering the operative  field. Maximum barrier sterile technique with sterile gowns and gloves were used for the procedure. Local anesthesia was provided with 1% lidocaine. Ultrasound was used to confirm patency of the right internal jugular vein. An ultrasound image was saved and recorded. After creating a small venotomy incision, a 21 gauge needle was advanced into the right internal jugular vein under direct, real-time ultrasound guidance. Ultrasound image documentation was performed. After securing guidewire access, an 8 Fr dilator was placed. A J-wire was kinked to measure appropriate catheter length. A Palindrome tunneled hemodialysis catheter measuring 19 cm from tip to cuff was chosen for placement. This was tunneled in a retrograde fashion from the chest wall to the venotomy incision. At the venotomy, serial dilatation was performed and a 15 Fr peel-away sheath was placed over a guidewire. The catheter was then placed through the sheath and the sheath removed. Final catheter positioning was confirmed and documented with a fluoroscopic spot image. The catheter was aspirated, flushed with saline, and injected with appropriate volume heparin dwells. The venotomy incision was closed with subcuticular 4-0 Vicryl. Dermabond was applied to the incision. The catheter exit site was secured with 0-Prolene retention sutures. COMPLICATIONS: None.   No pneumothorax. FINDINGS: After catheter placement, the tip lies in the right atrium. The catheter aspirates normally and is ready for immediate use. IMPRESSION: Placement of tunneled hemodialysis catheter via new puncture of the right internal jugular vein after removal of the non tunneled catheter. The new catheter tip lies in the right atrium. The catheter is ready for immediate use. Electronically Signed   By: Irish Lack M.D.   On: 11/12/2023 10:45   IR US Guide Vasc Access Right Result Date: 11/12/2023 CLINICAL DATA:  End-stage renal disease and status post placement of non tunneled temporary dialysis catheter on 11/01/2023. The patient now requires placement of a tunneled dialysis catheter for further longer-term hemodialysis. EXAM: TUNNELED CENTRAL VENOUS HEMODIALYSIS CATHETER PLACEMENT WITH ULTRASOUND AND FLUOROSCOPIC GUIDANCE ANESTHESIA/SEDATION: Moderate (conscious) sedation was employed during this procedure. A total of Versed 1.0 mg and Fentanyl 50 mcg was administered intravenously. Moderate Sedation Time: 16 minutes. The patient's level of consciousness and vital signs were monitored continuously by radiology nursing throughout the procedure under my direct supervision. MEDICATIONS: The patient is receiving IV Rocephin every 8 hours in the hospital and did not receive additional antibiotics for the procedure. FLUOROSCOPY: 24 seconds.  2.3 mGy. PROCEDURE: The procedure, risks, benefits, and alternatives were explained to the patient. Questions regarding the procedure were encouraged and answered. The patient understands and consents to the procedure. A timeout was performed prior to initiating the procedure. A non tunneled temporary right-sided dialysis catheter was removed prior to the procedure and pressure held at the exit site. The right neck and chest were prepped with chlorhexidine in a sterile fashion, and a sterile drape was applied covering the operative field. Maximum barrier sterile  technique with sterile gowns and gloves were used for the procedure. Local anesthesia was provided with 1% lidocaine. Ultrasound was used to confirm patency of the right internal jugular vein. An ultrasound image was saved and recorded. After creating a small venotomy incision, a 21 gauge needle was advanced into the right internal jugular vein under direct, real-time ultrasound guidance. Ultrasound image documentation was performed. After securing guidewire access, an 8 Fr dilator was placed. A J-wire was kinked to measure appropriate catheter length. A Palindrome tunneled hemodialysis catheter measuring 19 cm from tip to cuff was chosen for placement. This was tunneled in a retrograde  fashion from the chest wall to the venotomy incision. At the venotomy, serial dilatation was performed and a 15 Fr peel-away sheath was placed over a guidewire. The catheter was then placed through the sheath and the sheath removed. Final catheter positioning was confirmed and documented with a fluoroscopic spot image. The catheter was aspirated, flushed with saline, and injected with appropriate volume heparin dwells. The venotomy incision was closed with subcuticular 4-0 Vicryl. Dermabond was applied to the incision. The catheter exit site was secured with 0-Prolene retention sutures. COMPLICATIONS: None.  No pneumothorax. FINDINGS: After catheter placement, the tip lies in the right atrium. The catheter aspirates normally and is ready for immediate use. IMPRESSION: Placement of tunneled hemodialysis catheter via new puncture of the right internal jugular vein after removal of the non tunneled catheter. The new catheter tip lies in the right atrium. The catheter is ready for immediate use. Electronically Signed   By: Irish Lack M.D.   On: 11/12/2023 10:45   IR US Guide Vasc Access Left Result Date: 11/08/2023 INDICATION: ESRD.  IV antibiotics. EXAM: ULTRASOUND AND FLUOROSCOPIC GUIDED PLACEMENT OF TUNNELED CENTRAL VENOUS  "Powerline" CATHETER MEDICATIONS: The patient was on scheduled IV antibiotics. ANESTHESIA/SEDATION: Local anesthetic was administered. The patient was continuously monitored during the procedure by the interventional radiology nurse under my direct supervision. FLUOROSCOPY TIME:  Fluoroscopic dose; 3.8 mGy COMPLICATIONS: None immediate. PROCEDURE: Informed written consent was obtained from the patient and/or patient's representative after a discussion of the risks, benefits, and alternatives to treatment. Questions regarding the procedure were encouraged and answered. The LEFT neck and chest were prepped with chlorhexidine in a sterile fashion, and a sterile drape was applied covering the operative field. Maximum barrier sterile technique with sterile gowns and gloves were used for the procedure. A timeout was performed prior to the initiation of the procedure. After the overlying soft tissues were anesthetized, a small venotomy incision was created and a micropuncture kit was utilized to access the internal jugular vein. Real-time ultrasound guidance was utilized for vascular access including the acquisition of a permanent ultrasound image documenting patency of the accessed vessel. The microwire was utilized to measure appropriate catheter length. The micropuncture sheath was exchanged for a peel-away sheath over a guidewire. A 5 Fr dual lumen tunneled central venous catheter measuring 33 cm was tunneled in a retrograde fashion from the anterior chest wall to the venotomy incision. The catheter was then placed through the peel-away sheath with tip ultimately positioned at the superior caval-atrial junction. Final catheter positioning was confirmed and documented with a spot radiographic image. The catheter aspirates and flushes normally. The catheter was flushed with appropriate volume heparin dwells. The catheter exit site was secured with a 3-0 Ethilon retention suture. The venotomy incision was closed with  Dermabond. Dressings were applied. The patient tolerated the procedure well without immediate post procedural complication. FINDINGS: After catheter placement, the tip lies within the superior apect of the right atrium. The catheter aspirates and flushes normally and is ready for immediate use. IMPRESSION: Successful placement of 33 cm dual lumen tunneled "Powerline" central venous catheter via the LEFT internal jugular vein The tip of the catheter is positioned within the proximal RIGHT atrium. The catheter is ready for immediate use. Roanna Banning, MD [facility] Electronically Signed   By: Roanna Banning M.D.   On: 11/08/2023 18:10   IR Fluoro Guide CV Line Left Result Date: 11/08/2023 INDICATION: ESRD.  IV antibiotics. EXAM: ULTRASOUND AND FLUOROSCOPIC GUIDED  PLACEMENT OF TUNNELED CENTRAL VENOUS "Powerline" CATHETER MEDICATIONS: The patient was on scheduled IV antibiotics. ANESTHESIA/SEDATION: Local anesthetic was administered. The patient was continuously monitored during the procedure by the interventional radiology nurse under my direct supervision. FLUOROSCOPY TIME:  Fluoroscopic dose; 3.8 mGy COMPLICATIONS: None immediate. PROCEDURE: Informed written consent was obtained from the patient and/or patient's representative after a discussion of the risks, benefits, and alternatives to treatment. Questions regarding the procedure were encouraged and answered. The LEFT neck and chest were prepped with chlorhexidine in a sterile fashion, and a sterile drape was applied covering the operative field. Maximum barrier sterile technique with sterile gowns and gloves were used for the procedure. A timeout was performed prior to the initiation of the procedure. After the overlying soft tissues were anesthetized, a small venotomy incision was created and a micropuncture kit was utilized to access the internal jugular vein. Real-time ultrasound guidance was utilized  for vascular access including the acquisition of a permanent ultrasound image documenting patency of the accessed vessel. The microwire was utilized to measure appropriate catheter length. The micropuncture sheath was exchanged for a peel-away sheath over a guidewire. A 5 Fr dual lumen tunneled central venous catheter measuring 33 cm was tunneled in a retrograde fashion from the anterior chest wall to the venotomy incision. The catheter was then placed through the peel-away sheath with tip ultimately positioned at the superior caval-atrial junction. Final catheter positioning was confirmed and documented with a spot radiographic image. The catheter aspirates and flushes normally. The catheter was flushed with appropriate volume heparin dwells. The catheter exit site was secured with a 3-0 Ethilon retention suture. The venotomy incision was closed with Dermabond. Dressings were applied. The patient tolerated the procedure well without immediate post procedural complication. FINDINGS: After catheter placement, the tip lies within the superior apect of the right atrium. The catheter aspirates and flushes normally and is ready for immediate use. IMPRESSION: Successful placement of 33 cm dual lumen tunneled "Powerline" central venous catheter via the LEFT internal jugular vein The tip of the catheter is positioned within the proximal RIGHT atrium. The catheter is ready for immediate use. Roanna Banning, MD [facility] Electronically Signed   By: Roanna Banning M.D.   On: 11/08/2023 18:10   VAS [place] LOWER EXTREMITY VENOUS (DVT) Result Date: 11/07/2023  Lower Venous DVT Study Patient Name:  [name] [facility]  Date of Exam:   11/07/2023 Medical Rec #: [id number]          Accession #:    [id number] Date of Birth: 27-Oct-1955          Patient Gender: F Patient Age:   7 years Exam Location:  [facility] Procedure:      VAS [place] LOWER EXTREMITY VENOUS (DVT) Referring  Phys: Tresa Endo OSBORNE --------------------------------------------------------------------------------  Indications: Pain, stroke, and A-fib, anemia.  Comparison Study: No prior exam. Performing Technologist: Fernande Bras  Examination Guidelines: A complete evaluation includes B-mode imaging, spectral Doppler, color Doppler, and power Doppler as needed of all accessible portions of each vessel. Bilateral testing is considered an integral part of a complete examination. Limited examinations for reoccurring indications may be performed as noted. The reflux portion of the exam is performed with the patient in reverse Trendelenburg.  +---------+---------------+---------+-----------+----------+--------------+ RIGHT    CompressibilityPhasicitySpontaneityPropertiesThrombus Aging +---------+---------------+---------+-----------+----------+--------------+ CFV      Full           Yes      Yes                                 +---------+---------------+---------+-----------+----------+--------------+  SFJ      Full           Yes      Yes                                 +---------+---------------+---------+-----------+----------+--------------+ FV Prox  Full                                                        +---------+---------------+---------+-----------+----------+--------------+ FV Mid   Full                                                        +---------+---------------+---------+-----------+----------+--------------+ FV DistalFull                                                        +---------+---------------+---------+-----------+----------+--------------+ PFV      Full                                                        +---------+---------------+---------+-----------+----------+--------------+ POP      Full           Yes      Yes                                 +---------+---------------+---------+-----------+----------+--------------+ PTV       Full                                                        +---------+---------------+---------+-----------+----------+--------------+ PERO     Full                                                        +---------+---------------+---------+-----------+----------+--------------+   +---------+---------------+---------+-----------+----------+--------------+ LEFT     CompressibilityPhasicitySpontaneityPropertiesThrombus Aging +---------+---------------+---------+-----------+----------+--------------+ CFV      Full           Yes      Yes                                 +---------+---------------+---------+-----------+----------+--------------+ SFJ      Full           Yes      Yes                                 +---------+---------------+---------+-----------+----------+--------------+  FV Prox  Full                                                        +---------+---------------+---------+-----------+----------+--------------+ FV Mid   Full                                                        +---------+---------------+---------+-----------+----------+--------------+ FV DistalFull                                                        +---------+---------------+---------+-----------+----------+--------------+ PFV      Full                                                        +---------+---------------+---------+-----------+----------+--------------+ POP      Full           Yes      Yes                                 +---------+---------------+---------+-----------+----------+--------------+ PTV      Full                                                        +---------+---------------+---------+-----------+----------+--------------+ PERO     Full                                                        +---------+---------------+---------+-----------+----------+--------------+     Summary: BILATERAL: - No evidence of deep vein  thrombosis seen in the lower extremities, bilaterally. -No evidence of popliteal cyst, bilaterally.   *See table(s) above for measurements and observations. Electronically signed by Heath Lark on 11/07/2023 at 4:05:14 PM.    Final    CT ABDOMEN PELVIS WO CONTRAST Result Date: 11/06/2023 CLINICAL DATA:  Diverticulitis, complication suspected EXAM: CT ABDOMEN AND PELVIS WITHOUT CONTRAST TECHNIQUE: Multidetector CT imaging of the abdomen and pelvis was performed following the standard protocol without IV contrast. RADIATION DOSE REDUCTION: This exam was performed according to the departmental dose-optimization program which includes automated exposure control, adjustment of the mA and/or kV according to patient size and/or use of iterative reconstruction technique. COMPARISON:  10/31/2023 FINDINGS: Lower chest: Volume loss and opacity in the lower lungs consistent with atelectasis. There is a central line with tip at the upper right atrium. Coronary atherosclerosis. Hepatobiliary: No focal liver abnormality. Calcific in the intermediate density in the gallbladder attributed to stones. No biliary dilatation or  pericholecystic edema. Pancreas: Unremarkable. Spleen: Unremarkable. Adrenals/Urinary Tract: Negative adrenals. No hydronephrosis or stone. Renal cortical thinning and lobulation at the left upper pole. 3 mm stone at the upper pole left kidney. The urinary bladder is collapsed around a Foley catheter. Stomach/Bowel: Descending colostomy. Bowel anastomosis in the right abdomen. No evidence of bowel obstruction. Fluid collection with loculation appearance/peritoneal thickening along the left flank measuring up to 5 x 6 x 10 cm. Milder fluid accumulation in the ventral peritoneal space. Vascular/Lymphatic: Extensive atheromatous calcification of the aorta and iliacs. No mass or adenopathy. Reproductive:No pathologic findings. Other: No ascites or pneumoperitoneum. Musculoskeletal: No acute abnormalities.  T9  butterfly vertebra IMPRESSION: 1. Interval colostomy with fluid collection along the left gutter, dominant pocket measuring 10 x 6 x 5 cm. 2. No bowel obstruction. 3. Cholelithiasis and left nephrolithiasis. Electronically Signed   By: Tiburcio Pea M.D.   On: 11/06/2023 07:12   DG Chest Port 1 View Result Date: 11/03/2023 CLINICAL DATA:  Shortness of breath EXAM: PORTABLE CHEST - 1 VIEW COMPARISON:  11/11/2018 FINDINGS: Unchanged mild cardiomegaly and pulmonary vascular congestion. Interval worsening of left basilar opacity likely due to atelectasis. Lungs otherwise clear. Examination is limited due to expiratory phase of imaging. Interval placement of temporary right IJ hemodialysis catheter which terminates in the region of the right atrium. Nasogastric tube terminates in the left upper quadrant. IMPRESSION: Interval worsening of left basilar opacity which is likely due to atelectasis. Electronically Signed   By: Acquanetta Belling M.D.   On: 11/03/2023 07:22   IR Fluoro Guide CV Line Right Result Date: 11/01/2023 INDICATION: ESRD requiring HD. EXAM: NON-TUNNELED CENTRAL VENOUS HEMODIALYSIS CATHETER PLACEMENT WITH ULTRASOUND AND FLUOROSCOPIC GUIDANCE COMPARISON:  CT AP 10/31/2023. MEDICATIONS: Local anesthetic was administered. FLUOROSCOPY TIME:  Fluoroscopic dose; 35 mGy COMPLICATIONS: None immediate. PROCEDURE: Informed written consent was obtained from the patient and/or patient's representative after a discussion of the risks, benefits, and alternatives to treatment. Questions regarding the procedure were encouraged and answered. The RIGHT neck and chest were prepped with chlorhexidine in a sterile fashion, and a sterile drape was applied covering the operative field. Maximum barrier sterile technique with sterile gowns and gloves were used for the procedure. A timeout was performed prior to the initiation of the procedure. After the overlying soft tissues were anesthetized, a small venotomy incision was  created and a micropuncture kit was utilized to access the external jugular vein. Real-time ultrasound guidance was utilized for vascular access including the acquisition of a permanent ultrasound image documenting patency of the accessed vessel. The microwire was utilized to measure appropriate catheter length. A stiff glidewire was advanced to the level of the IVC. Under fluoroscopic guidance, the venotomy was serially dilated, ultimately allowing placement of a 20 cm temporary Trialysis catheter with tip ultimately terminating within the superior aspect of the right atrium. Final catheter positioning was confirmed and documented with a spot radiographic image. The catheter aspirates and flushes normally. The catheter was flushed with appropriate volume heparin dwells. The catheter exit site was secured with a 2-0 Ethilon retention suture. A dressing was placed. The patient tolerated the procedure well without immediate post procedural complication. IMPRESSION: Successful placement of a RIGHT EXTERNAL jugular approach 20 cm non-tunneled dialysis catheter The tip of the catheter is positioned within the proximal RIGHT atrium. The catheter is ready for immediate use. PLAN: This catheter may be converted to a tunneled dialysis catheter at a later date as indicated. Roanna Banning, MD [facility]  Radiology Electronically Signed   By: Roanna Banning M.D.   On: 11/01/2023 18:15   IR US Guide Vasc Access Right Result Date: 11/01/2023 INDICATION: ESRD requiring HD. EXAM: NON-TUNNELED CENTRAL VENOUS HEMODIALYSIS CATHETER PLACEMENT WITH ULTRASOUND AND FLUOROSCOPIC GUIDANCE COMPARISON:  CT AP 10/31/2023. MEDICATIONS: Local anesthetic was administered. FLUOROSCOPY TIME:  Fluoroscopic dose; 35 mGy COMPLICATIONS: None immediate. PROCEDURE: Informed written consent was obtained from the patient and/or patient's representative after a discussion of the risks, benefits, and  alternatives to treatment. Questions regarding the procedure were encouraged and answered. The RIGHT neck and chest were prepped with chlorhexidine in a sterile fashion, and a sterile drape was applied covering the operative field. Maximum barrier sterile technique with sterile gowns and gloves were used for the procedure. A timeout was performed prior to the initiation of the procedure. After the overlying soft tissues were anesthetized, a small venotomy incision was created and a micropuncture kit was utilized to access the external jugular vein. Real-time ultrasound guidance was utilized for vascular access including the acquisition of a permanent ultrasound image documenting patency of the accessed vessel. The microwire was utilized to measure appropriate catheter length. A stiff glidewire was advanced to the level of the IVC. Under fluoroscopic guidance, the venotomy was serially dilated, ultimately allowing placement of a 20 cm temporary Trialysis catheter with tip ultimately terminating within the superior aspect of the right atrium. Final catheter positioning was confirmed and documented with a spot radiographic image. The catheter aspirates and flushes normally. The catheter was flushed with appropriate volume heparin dwells. The catheter exit site was secured with a 2-0 Ethilon retention suture. A dressing was placed. The patient tolerated the procedure well without immediate post procedural complication. IMPRESSION: Successful placement of a RIGHT EXTERNAL jugular approach 20 cm non-tunneled dialysis catheter The tip of the catheter is positioned within the proximal RIGHT atrium. The catheter is ready for immediate use. PLAN: This catheter may be converted to a tunneled dialysis catheter at a later date as indicated. Roanna Banning, MD [facility] Radiology Electronically Signed   By: Roanna Banning M.D.   On: 11/01/2023 18:15   CT ABDOMEN PELVIS WO  CONTRAST Result Date: 10/31/2023 CLINICAL DATA:  Diverticulitis, complication suspected. EXAM: CT ABDOMEN AND PELVIS WITHOUT CONTRAST TECHNIQUE: Multidetector CT imaging of the abdomen and pelvis was performed following the standard protocol without IV contrast. RADIATION DOSE REDUCTION: This exam was performed according to the departmental dose-optimization program which includes automated exposure control, adjustment of the mA and/or kV according to patient size and/or use of iterative reconstruction technique. COMPARISON:  10/27/2023. FINDINGS: Lower chest: Heart is enlarged and multi-vessel coronary artery calcifications are noted. Consolidation and patchy airspace disease is present at the lung bases bilaterally. Hepatobiliary: No focal liver abnormality is seen. Stones are present within the gallbladder. No biliary ductal dilatation. Pancreas: Unremarkable. No pancreatic ductal dilatation or surrounding inflammatory changes. Spleen: Normal in size without focal abnormality. Adrenals/Urinary Tract: The adrenal glands are within normal limits. Calcifications are present in the upper pole of the left kidney. There is a cyst in the lower pole of the right kidney. No hydronephrosis bilaterally. The bladder is unremarkable. Stomach/Bowel: The stomach is within normal limits. No bowel obstruction or pneumatosis is seen. Right hemicolectomy changes are noted. There is diffuse colonic wall thickening. Scattered diverticula are present along the colon. A few small foci of free air are noted in the mesentery in the mid left abdomen, slightly increased from the previous exam. There  is suggestion of contrast extravasation in the region. There is fine no definite abscess is seen. Vascular/Lymphatic: Aortic atherosclerosis. No enlarged abdominal or pelvic lymph nodes. Reproductive: No acute abnormality. Other: Mild ascites is noted in all 4 quadrants. A fat containing umbilical hernia is noted. There is mild anasarca.  Musculoskeletal: The bony structures are stable. IMPRESSION: 1. Worsening diverticulitis of the descending colon with perforation and increasing free air and suspected oral contrast extravasation in the mid left abdomen. No definite abscess is seen. Surgical consultation is recommended. 2. Mild ascites. 3. Diffuse colonic wall thickening which may be due to associated inflammatory changes. 4. Patchy airspace disease at the lung bases with bilateral lower lobe consolidation. 5. Cholelithiasis. 6. Aortic atherosclerosis. Critical Value/emergent results were called by telephone at the time of interpretation on 10/31/2023 at 11:59 pm to provider Dr. Antionette Char, who verbally acknowledged these results. Electronically Signed   By: Thornell Sartorius M.D.   On: 10/31/2023 23:59   CT ABDOMEN PELVIS WO CONTRAST Result Date: 10/27/2023 CLINICAL DATA:  Acute nonlocalized abdominal pain. Chills and fever. Vomiting and diarrhea. EXAM: CT ABDOMEN AND PELVIS WITHOUT CONTRAST TECHNIQUE: Multidetector CT imaging of the abdomen and pelvis was performed following the standard protocol without IV contrast. RADIATION DOSE REDUCTION: This exam was performed according to the departmental dose-optimization program which includes automated exposure control, adjustment of the mA and/or kV according to patient size and/or use of iterative reconstruction technique. COMPARISON:  CT abdomen and pelvis 04/19/2021 FINDINGS: Lower chest: Bibasilar atelectasis.  No acute abnormality. Hepatobiliary: Cholelithiasis without evidence of acute cholecystitis. No biliary dilation. Unremarkable noncontrast appearance of the liver. Pancreas: Unremarkable. Spleen: Unremarkable. Adrenals/Urinary Tract: Normal adrenal glands. No urinary calculi or hydronephrosis. Unremarkable bladder. Stomach/Bowel: Stomach is within normal limits. No bowel obstruction. Colonic diverticulosis. Wall thickening, inflammatory stranding, and free fluid about the descending colon. Loosely  organized fluid and gas about the inflamed descending colon (circa series 3/image 49-50) compatible with contained perforation. The adjacent jejunum is inflamed. Postoperative change about the colon with ileocolonic anastomosis in the right upper quadrant. Vascular/Lymphatic: Aortic atherosclerosis. No enlarged abdominal or pelvic lymph nodes. Reproductive: No acute abnormality. Other: Fat containing periumbilical hernia. Musculoskeletal: No acute fracture. IMPRESSION: 1. Acute diverticulitis of the descending colon with contained perforation. 2. Inflamed jejunum adjacent to the perforated diverticulitis. 3.  Aortic Atherosclerosis (ICD10-I70.0). Critical Value/emergent results were called by telephone at the time of interpretation on 10/27/2023 at 9:22 pm to provider [facility] , who verbally acknowledged these results. Electronically Signed   By: Minerva Fester M.D.   On: 10/27/2023 21:24    Signature  -   Huey Bienenstock M.D on 11/17/2023 at 12:14 PM   -  To page go to [email or web address]

## 2023-11-17 NOTE — Plan of Care (Signed)
  Problem: Education: Goal: Ability to describe self-care measures that may prevent or decrease complications (Diabetes Survival Skills Education) will improve Outcome: Progressing Goal: Individualized Educational Video(s) Outcome: Progressing   Problem: Coping: Goal: Ability to adjust to condition or change in health will improve Outcome: Progressing   Problem: Fluid Volume: Goal: Ability to maintain a balanced intake and output will improve Outcome: Progressing   Problem: Health Behavior/Discharge Planning: Goal: Ability to identify and utilize available resources and services will improve Outcome: Progressing   Problem: Metabolic: Goal: Ability to maintain appropriate glucose levels will improve Outcome: Progressing   Problem: Nutritional: Goal: Maintenance of adequate nutrition will improve Outcome: Progressing

## 2023-11-17 NOTE — Progress Notes (Signed)
 Pt's wound vac has been beeping throughout the night due to "blockage" as detected by the device. Tried to reinforced dressing and flush the tubing form time to time. It worked for some time but alarm still going off. Pressure is only going up to though it sustained at for a while. Also, tried to change the tubing but still doesn't works. Drainage is too viscous that is very hard to pass through the tube. This RN and the charge nurse tried to troubleshoot and even changed the machine but still keeps on beeping.

## 2023-11-17 NOTE — Progress Notes (Signed)
 Patient ID: [name], female   DOB: 02/01/1956, 68 y.o.   MRN: [id number] [facility] Progress Note   Assessment/ Plan:   1.  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  After initial refusal of wanting to pursue renal replacement therapy, she changed her mind and asked to start dialysis with the intention of undergoing surgery for contained perforation of acute diverticulitis.   -Maintain dialysis on MWF schedule -TDC w/ IR on 2/18, appreciate help -Some low flows on catheter 2/21.  TPA provided -Palliative care has been involved and patient wishes to continue with dialysis for now.   -CLIP in process; likely [facility].  Probably going to CIR. -Would hold on permanent access creation for now given overall clinical picture 2.  Acute diverticulitis with contained perforation of descending colon: On Zosyn and now status post exploratory laparotomy with Hartman's resection for perforated diverticulitis/peritonitis (2/8).  Management per surgery and primary team 3.  Anion gap metabolic acidosis: Resolved with renal replacement therapy 4.  Anemia: Has received blood and IV iron. ESA also ordered 5. HTN: BP up and down, increase UF with HD, cont current meds.  More aggressive UF. Consider intensifying BP regimen 6. Dispo: eval for CIR.  Subjective:   Feels well with no complaints   Objective:   BP (!) 136/59 (BP Location: Right Arm)   Pulse 77   Temp 98.3 F (36.8 C) (Oral)   Resp 14   Ht 5' 7.01" (1.702 m)   Wt 90.4 kg   SpO2 93%   BMI 31.21 kg/m   Intake/Output Summary (Last 24 hours) at 11/17/2023 1009 Last data filed at 11/16/2023 2137 Gross per 24 hour  Intake 240 ml  Output --  Net 240 ml   Weight change:   Physical Exam: Gen: Lying in bed in no distress CVS: Normal rate, no rub Resp: Bilateral chest rise  with no increased work of breathing Abd: Minimal distention, colostomy bag in place Ext: Trace ankle edema.  Warm and well-perfused  Imaging: No results found.     Labs: BMET Recent Labs  Lab 11/11/23 0327 11/13/23 0052 11/14/23 0354 11/15/23 0806  NA 137 136 135 136  K 3.7 4.0 3.7 3.5  CL 102 100 97* 99  CO2 22 24 26 24   GLUCOSE 161* 128* 152* 96  BUN 55* 39* 25* 30*  CREATININE 7.89* 7.04* 5.39* 7.20*  CALCIUM 7.5* 7.6* 7.7* 7.8*  PHOS 2.2*  --   --  4.9*   CBC Recent Labs  Lab 11/11/23 0327 11/12/23 0420 11/13/23 0052 11/14/23 0354 11/15/23 0806  WBC 22.5* 22.0* 18.8* 15.8* 13.5*  NEUTROABS 18.9*  --   --   --   --   HGB 8.0* 8.0* 7.6* 8.2* 8.6*  HCT 24.8* 24.9* 23.9* 26.0* 26.7*  MCV 83.2 83.0 83.9 84.1 84.2  PLT 331 326 326 306 325    Medications:     amLODipine  5 mg Oral Daily   apixaban  5 mg Oral BID   carvedilol  6.25 mg Oral BID WC   Chlorhexidine Gluconate Cloth  6 each Topical Q0600   darbepoetin (ARANESP) injection - DIALYSIS  60 mcg Subcutaneous Q Mon-1800   insulin aspart  0-15 Units Subcutaneous TID WC   insulin aspart  0-5 Units Subcutaneous QHS   insulin glargine-yfgn  12 Units Subcutaneous Daily   mouth rinse  15 mL  Mouth Rinse 4 times per day   pantoprazole  40 mg Oral BID   sodium chloride flush  10-40 mL Intracatheter Q12H   Darnell Level  11/17/2023, 10:09 AM

## 2023-11-18 DIAGNOSIS — K5732 Diverticulitis of large intestine without perforation or abscess without bleeding: Secondary | ICD-10-CM | POA: Diagnosis not present

## 2023-11-18 LAB — CBC
HCT: 28.8 % — ABNORMAL LOW (ref 36.0–46.0)
Hemoglobin: 9.1 g/dL — ABNORMAL LOW (ref 12.0–15.0)
MCH: 26.8 pg (ref 26.0–34.0)
MCHC: 31.6 g/dL (ref 30.0–36.0)
MCV: 85 fL (ref 80.0–100.0)
Platelets: 338 10*3/uL (ref 150–400)
RBC: 3.39 MIL/uL — ABNORMAL LOW (ref 3.87–5.11)
RDW: 16.9 % — ABNORMAL HIGH (ref 11.5–15.5)
WBC: 10.7 10*3/uL — ABNORMAL HIGH (ref 4.0–10.5)
nRBC: 0 % (ref 0.0–0.2)

## 2023-11-18 LAB — RENAL FUNCTION PANEL
Albumin: 2.1 g/dL — ABNORMAL LOW (ref 3.5–5.0)
Anion gap: 13 (ref 5–15)
BUN: 30 mg/dL — ABNORMAL HIGH (ref 8–23)
CO2: 22 mmol/L (ref 22–32)
Calcium: 7.9 mg/dL — ABNORMAL LOW (ref 8.9–10.3)
Chloride: 100 mmol/L (ref 98–111)
Creatinine, Ser: 8.2 mg/dL — ABNORMAL HIGH (ref 0.44–1.00)
GFR, Estimated: 5 mL/min — ABNORMAL LOW (ref >60)
Glucose, Bld: 118 mg/dL — ABNORMAL HIGH (ref 70–99)
Phosphorus: 4.8 mg/dL — ABNORMAL HIGH (ref 2.5–4.6)
Potassium: 3.5 mmol/L (ref 3.5–5.1)
Sodium: 135 mmol/L (ref 135–145)

## 2023-11-18 LAB — GLUCOSE, CAPILLARY
Glucose-Capillary: 137 mg/dL — ABNORMAL HIGH (ref 70–99)
Glucose-Capillary: 101 mg/dL — ABNORMAL HIGH (ref 70–99)
Glucose-Capillary: 120 mg/dL — ABNORMAL HIGH (ref 70–99)
Glucose-Capillary: 157 mg/dL — ABNORMAL HIGH (ref 70–99)

## 2023-11-18 MED ORDER — HEPARIN SODIUM (PORCINE) 1000 UNIT/ML IJ SOLN
INTRAMUSCULAR | Status: AC
  Administered 2023-11-18: 3200 [IU]
  Filled 2023-11-18: qty 4

## 2023-11-18 NOTE — Progress Notes (Signed)
 Received confirmation of pt's out-pt HD schedule at [facility] on Friday afternoon for MWF at d/c. Clinic aware insurance auth pending for CIR. Will provide clinic and schedule info to pt and staff once CIR is confirmed in the event arrangements have to be changed. Will assist as needed.   Olivia Canter Renal Navigator [phone]

## 2023-11-18 NOTE — Progress Notes (Addendum)
 Received patient in bed.Awake,alert and oriented x 4. Consent verified.  Access used : Right HD catheter that worked well.Dressing on date.  Duration of treatment : 3 hours.  Net uf 2.7 liters.  Tolerated treatment : Yes.                               Last 15 minutes of her treatment,she quit." It s not my day today".  Hand off to the patient's nurse : Back into her room with stable medical condition via transporter.

## 2023-11-18 NOTE — Progress Notes (Signed)
 Patient ID: [name], female   DOB: 03-12-1956, 68 y.o.   MRN: [id number] [facility] Progress Note   Assessment/ Plan:   1.  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  After initial refusal of wanting to pursue renal replacement therapy, she changed her mind and asked to start dialysis with the intention of undergoing surgery for contained perforation of acute diverticulitis.  -TDC w/ IR on 2/18 -Maintain dialysis on MWF schedule -Some low flows on catheter 2/21.  TPA provided -Palliative care has been involved and patient wishes to continue with dialysis for now.   -CLIP in process; likely [facility], renal navigator following.  Probably going to CIR--pending -Would hold on permanent access creation for now given overall clinical picture, will likely need to revisit as an outpatient 2.  Acute diverticulitis with contained perforation of descending colon: On Zosyn and now status post exploratory laparotomy with Hartman's resection for perforated diverticulitis/peritonitis (2/8).  Management per surgery and primary team 3.  Anion gap metabolic acidosis: Resolved with renal replacement therapy 4.  Anemia: Has received blood and IV iron. ESA also ordered 5. HTN: BP acceptable/stable, uf as tolerated 6. Dispo: eval for CIR.  Subjective:   Patient seen and examined bedside.  No acute events, no complaints.   Objective:   BP (!) 160/59 (BP Location: Right Arm)   Pulse 73   Temp 98.4 F (36.9 C) (Oral)   Resp 12   Ht 5' 7.01" (1.702 m)   Wt 90.4 kg   SpO2 94%   BMI 31.21 kg/m   Intake/Output Summary (Last 24 hours) at 11/18/2023 1128 Last data filed at 11/18/2023 0400 Gross per 24 hour  Intake --  Output 600 ml  Net -600 ml   Weight change:   Physical Exam: Gen: Lying in bed in no distress CVS: Normal rate, no  rub Resp: Bilateral chest rise with no increased work of breathing, cta bl Abd: Minimal distention, colostomy bag in place Ext: Trace ankle edema.  Warm and well-perfused Neuro: awake, alert Dialysis access: right [facility] c/d/i  Imaging: No results found.     Labs: BMET Recent Labs  Lab 11/13/23 0052 11/14/23 0354 11/15/23 0806 11/18/23 0551  NA 136 135 136 135  K 4.0 3.7 3.5 3.5  CL 100 97* 99 100  CO2 24 26 24 22   GLUCOSE 128* 152* 96 118*  BUN 39* 25* 30* 30*  CREATININE 7.04* 5.39* 7.20* 8.20*  CALCIUM 7.6* 7.7* 7.8* 7.9*  PHOS  --   --  4.9* 4.8*   CBC Recent Labs  Lab 11/13/23 0052 11/14/23 0354 11/15/23 0806 11/18/23 0551  WBC 18.8* 15.8* 13.5* 10.7*  HGB 7.6* 8.2* 8.6* 9.1*  HCT 23.9* 26.0* 26.7* 28.8*  MCV 83.9 84.1 84.2 85.0  PLT 326 306 325 338    Medications:     amLODipine  5 mg Oral Daily   apixaban  5 mg Oral BID   carvedilol  6.25 mg Oral BID WC   Chlorhexidine Gluconate Cloth  6 each Topical Q0600   darbepoetin (ARANESP) injection - DIALYSIS  60 mcg Subcutaneous Q Mon-1800   insulin aspart  0-15 Units Subcutaneous TID WC   insulin aspart  0-5 Units Subcutaneous QHS   insulin glargine-yfgn  12 Units Subcutaneous Daily   mouth rinse  15 mL Mouth Rinse 4 times per day   pantoprazole  40 mg  Oral BID   sodium chloride flush  10-40 mL Intracatheter Q12H   Naome Brigandi  11/18/2023, 11:28 AM

## 2023-11-18 NOTE — Progress Notes (Signed)
   11/18/23 1056  Mobility  Activity Ambulated with assistance in hallway;Ambulated with assistance to bathroom  Level of Assistance Standby assist, set-up cues, supervision of patient - no hands on  Assistive Device Front wheel walker  Distance Ambulated (ft) 50 ft  Activity Response Tolerated fair  Mobility Referral Yes  Mobility visit 1 Mobility  Mobility Specialist Start Time (ACUTE ONLY) 1043  Mobility Specialist Stop Time (ACUTE ONLY) 1056  Mobility Specialist Time Calculation (min) (ACUTE ONLY) 13 min   Mobility Specialist: Progress Note  Pt agreeable to mobility session - received in bed. Pt was asymptomatic throughout session with no complaints. Void complete, pericare completed ind. Returned to standing in hall with PT with all needs met - call bell within reach. MS passed off to PT, pt preferred to mobilize before HD.   Barnie Mort, BS Mobility Specialist Please contact via SecureChat or  Rehab office at [phone].

## 2023-11-18 NOTE — Plan of Care (Signed)
  Problem: Education: Goal: Ability to describe self-care measures that may prevent or decrease complications (Diabetes Survival Skills Education) will improve Outcome: Progressing Goal: Individualized Educational Video(s) Outcome: Progressing   Problem: Coping: Goal: Ability to adjust to condition or change in health will improve Outcome: Progressing   Problem: Fluid Volume: Goal: Ability to maintain a balanced intake and output will improve Outcome: Progressing   Problem: Health Behavior/Discharge Planning: Goal: Ability to identify and utilize available resources and services will improve Outcome: Progressing Goal: Ability to manage health-related needs will improve Outcome: Progressing   Problem: Metabolic: Goal: Ability to maintain appropriate glucose levels will improve Outcome: Progressing   Problem: Nutritional: Goal: Maintenance of adequate nutrition will improve Outcome: Progressing Goal: Progress toward achieving an optimal weight will improve Outcome: Progressing   Problem: Skin Integrity: Goal: Risk for impaired skin integrity will decrease Outcome: Progressing   Problem: Tissue Perfusion: Goal: Adequacy of tissue perfusion will improve Outcome: Progressing   Problem: Education: Goal: Knowledge of General Education information will improve Description: Including pain rating scale, medication(s)/side effects and non-pharmacologic comfort measures Outcome: Progressing   Problem: Health Behavior/Discharge Planning: Goal: Ability to manage health-related needs will improve Outcome: Progressing   Problem: Clinical Measurements: Goal: Ability to maintain clinical measurements within normal limits will improve Outcome: Progressing Goal: Will remain free from infection Outcome: Progressing Goal: Diagnostic test results will improve Outcome: Progressing Goal: Respiratory complications will improve Outcome: Progressing Goal: Cardiovascular complication will  be avoided Outcome: Progressing   Problem: Activity: Goal: Risk for activity intolerance will decrease Outcome: Progressing   Problem: Nutrition: Goal: Adequate nutrition will be maintained Outcome: Progressing   Problem: Coping: Goal: Level of anxiety will decrease Outcome: Progressing   Problem: Elimination: Goal: Will not experience complications related to bowel motility Outcome: Progressing Goal: Will not experience complications related to urinary retention Outcome: Progressing   Problem: Pain Managment: Goal: General experience of comfort will improve and/or be controlled Outcome: Progressing   Problem: Safety: Goal: Ability to remain free from injury will improve Outcome: Progressing   Problem: Skin Integrity: Goal: Risk for impaired skin integrity will decrease Outcome: Progressing   Problem: Education: Goal: Knowledge of the prescribed therapeutic regimen will improve Outcome: Progressing   Problem: Coping: Goal: Ability to identify and develop effective coping behavior will improve Outcome: Progressing   Problem: Clinical Measurements: Goal: Quality of life will improve Outcome: Progressing   Problem: Respiratory: Goal: Verbalizations of increased ease of respirations will increase Outcome: Progressing   Problem: Role Relationship: Goal: Family's ability to cope with current situation will improve Outcome: Progressing Goal: Ability to verbalize concerns, feelings, and thoughts to partner or family member will improve Outcome: Progressing   Problem: Pain Management: Goal: Satisfaction with pain management regimen will improve Outcome: Progressing   Problem: Education: Goal: Knowledge of disease and its progression will improve Outcome: Progressing Goal: Individualized Educational Video(s) Outcome: Progressing   Problem: Fluid Volume: Goal: Compliance with measures to maintain balanced fluid volume will improve Outcome: Progressing    Problem: Health Behavior/Discharge Planning: Goal: Ability to manage health-related needs will improve Outcome: Progressing   Problem: Nutritional: Goal: Ability to make healthy dietary choices will improve Outcome: Progressing   Problem: Clinical Measurements: Goal: Complications related to the disease process, condition or treatment will be avoided or minimized Outcome: Progressing

## 2023-11-18 NOTE — Progress Notes (Signed)
 Progress Note  16 Days Post-Op  Subjective: No new complaints. Pain well controlled. Tolerating diet with bowel function  Objective: Vital signs in last 24 hours: Temp:  [97.8 F (36.6 C)-98.8 F (37.1 C)] 98.4 F (36.9 C) (02/24 0751) Pulse Rate:  [72-79] 73 (02/24 0751) Resp:  [10-21] 12 (02/24 0751) BP: (131-160)/(52-60) 160/59 (02/24 0751) SpO2:  [92 %-96 %] 94 % (02/24 0751) Last BM Date : 11/18/23  Intake/Output from previous day: 02/23 0701 - 02/24 0700 In: -  Out: 600 [Stool:600] Intake/Output this shift: No intake/output data recorded.  PE: Abd: soft, appropriately ttp, midline wound change this am with good granulation tissue and healthy and appropriate bleeding. Colostomy with soft stool and stoma pink and viable   Lab Results:  Recent Labs    11/18/23 0551  WBC 10.7*  HGB 9.1*  HCT 28.8*  PLT 338   BMET Recent Labs    11/18/23 0551  NA 135  K 3.5  CL 100  CO2 22  GLUCOSE 118*  BUN 30*  CREATININE 8.20*  CALCIUM 7.9*   PT/INR No results for input(s): "LABPROT", "INR" in the last 72 hours. CMP     Component Value Date/Time   NA 135 11/18/2023 0551   NA 137 02/28/2021 1548   K 3.5 11/18/2023 0551   CL 100 11/18/2023 0551   CO2 22 11/18/2023 0551   GLUCOSE 118 (H) 11/18/2023 0551   BUN 30 (H) 11/18/2023 0551   BUN 33 (H) 02/28/2021 1548   CREATININE 8.20 (H) 11/18/2023 0551   CREATININE 3.38 (H) 12/26/2015 1347   CALCIUM 7.9 (L) 11/18/2023 0551   PROT 5.8 (L) 11/11/2023 0327   PROT 7.0 11/07/2017 1414   ALBUMIN 2.1 (L) 11/18/2023 0551   ALBUMIN 4.0 11/07/2017 1414   AST 23 11/11/2023 0327   ALT 17 11/11/2023 0327   ALKPHOS 39 11/11/2023 0327   BILITOT 0.5 11/11/2023 0327   BILITOT 0.2 11/07/2017 1414   GFRNONAA 5 (L) 11/18/2023 0551   GFRNONAA 13 (L) 01/10/2015 1428   GFRAA 21 (L) 12/21/2019 1802   GFRAA 16 (L) 01/10/2015 1428   Lipase     Component Value Date/Time   LIPASE 26 10/27/2023 1936        Studies/Results: No results found.   Anti-infectives: Anti-infectives (From admission, onward)    Start     Dose/Rate Route Frequency Ordered Stop   11/08/23 1200  erythromycin (EES) 400 MG/5ML suspension 400 mg  Status:  Discontinued        400 mg Oral Every 6 hours 11/08/23 0815 11/12/23 1111   11/06/23 0815  piperacillin-tazobactam (ZOSYN) IVPB 2.25 g  Status:  Discontinued        2.25 g 100 mL/hr over 30 Minutes Intravenous Every 8 hours 11/06/23 0729 11/14/23 0951   11/01/23 1415  piperacillin-tazobactam (ZOSYN) IVPB 2.25 g        2.25 g 100 mL/hr over 30 Minutes Intravenous Every 8 hours 11/01/23 1316 11/06/23 0602   10/29/23 1100  piperacillin-tazobactam (ZOSYN) IVPB 2.25 g  Status:  Discontinued        2.25 g 100 mL/hr over 30 Minutes Intravenous Every 8 hours 10/29/23 1006 11/01/23 1016   10/28/23 0600  piperacillin-tazobactam (ZOSYN) IVPB 2.25 g  Status:  Discontinued        2.25 g 100 mL/hr over 30 Minutes Intravenous Every 8 hours 10/27/23 2224 10/27/23 2235   10/28/23 0600  ceFEPIme (MAXIPIME) 1 g in sodium chloride 0.9 % 100 mL IVPB  Status:  Discontinued        1 g 200 mL/hr over 30 Minutes Intravenous Every 24 hours 10/27/23 2235 10/29/23 0954   10/28/23 0600  metroNIDAZOLE (FLAGYL) IVPB 500 mg  Status:  Discontinued        500 mg 100 mL/hr over 60 Minutes Intravenous Every 12 hours 10/27/23 2235 10/29/23 0954   10/27/23 2130  piperacillin-tazobactam (ZOSYN) IVPB 3.375 g        3.375 g 100 mL/hr over 30 Minutes Intravenous  Once 10/27/23 2128 10/27/23 2320        Assessment/Plan  POD 16, s/p Hartmann's by Dr. Freida Busman 11/02/23 forSigmoid diverticulitis with perforation  - WBC down to 10.7K.  Off abx -hgb 9.1 - VAC in place with no evidence of bleeding. - renal diet and tolerating - therapies for mobilization, rec CIR - VAC to midline wound, M/Th sched.  - WOC for colostomy care  -patient is approaching surgical stability.  She would greatly benefit  from CIR over SNF placement due to her multiple medical needs, HD, anemia, bleeding from abdominal wound on anticoagulation.  It would be more beneficial to the patient to be in CIR so if she were to develop a bleeding complication from her wound on Eliquis, surgical services are more readily available.  If she were to go to SNF, she would be high risk for readmission due to possible complications as noted above.  -patient is surgically stable for DC. Surgical team will remain available as needed     FEN: renal diet, protein supplements VTE: Eliquis ID: [email or web address] 2/3>2/4; Zosyn 2/4>>2/20   - per [facility] -  PAF  HTN HLD AKI on CKD stage V - on HD CAD T2DM   LOS: 22 days     Eric Form, [facility] Surgery 11/18/2023, 11:02 AM Please see Amion for pager number during day hours 7:00am-4:30pm

## 2023-11-18 NOTE — Progress Notes (Signed)
 PROGRESS NOTE        PATIENT DETAILS Name: [name] Age: 68 y.o. Sex: female Date of Birth: Oct 29, 1955 Admit Date: 10/27/2023 Admitting Physician Dolly Rias, MD WUJ:[id number], Devonne Doughty, NP  Brief Summary: Patient is a 68 y.o.  female with history of CKD 5, A-fib on Eliquis, CAD-who presented with abdominal pain-found to have acute diverticulitis with contained perforation involving the descending colon.  Hospital course complicated by worsening AKI-thought to have progressed to ESRD-requiring initiation of HD.  See below for further details.  Significant events: 2/2>> admit to [facility] 2/6>> CT abdomen worsening diverticulitis with perforation 2/7>> transitioned to full comfort measures-as with worsening AKI-however multiple family members arrived-comfort care revoked-now full code-full scope of treatment including HD/laparotomy with colostomy.  General surgery/nephrology reconsulted.  IR placed [facility] and patient underwent first hemodialysis.  2/8>> remains n.p.o-colectomy/colostomy later today. 2/20>> wound VAC reapplied  Significant studies: 2/2>> CT abdomen/pelvis: Acute diverticulitis-descending colon with contained perforation 2/6>> CT abdomen/pelvis: Worsening diverticulitis of descending colon with perforation and increasing failure-suspected oral contrast extravasation left abdomen.  Significant microbiology data: 2/2>> COVID/influenza/RSV PCR: Negative  Procedures: 2/8>> by Dr. Freida Busman underwent exploratory laparotomy with Hartman's resection for perforated diverticulitis with diffuse peritonitis, colostomy in place. 2/13 - R IJ Trialysis catheter - IR 2/14 -left IJ Central line placed by IR for TNA(removed on 2/22) 11/12/2023. R.internal jugular tunneled HD catheter placement by IR  Consults: Surgery Renal Palliative IR  Subjective:   No significant events overnight, she denies any complaints today   Objective: Vitals: Blood pressure (!)  160/59, pulse 73, temperature 98.4 F (36.9 C), temperature source Oral, resp. rate 12, height 5' 7.01" (1.702 m), weight 90.4 kg, SpO2 94%.   Exam:  Awake Alert, Oriented X 3, No new F.N deficits, Normal affect Symmetrical Chest wall movement, Good air movement bilaterally, CTAB RRR,No Gallops,Rubs or new Murmurs, No Parasternal Heave or rigidity. No Cyanosis, Clubbing or edema, No new Rash or bruise    +ve B.Sounds, Abd Soft, bland surgical wound covered with wound VAC, left lower abdomen colostomy with liquid stool present No Cyanosis, Clubbing or edema, No new Rash or bruise     Right upper chest tunneled HD catheter,     Assessment/Plan:  Acute diverticulitis with contained perforation of the descending colon  -Management Per general surgery, status post Hartman's procedure by Dr. Freida Busman 11/02/2023. -Currently off TPN, appetite has improved, tolerating p.o. intake, DC IV erythromycin . -Wound care per general surgery, wound VAC t reapplied 2/20 -Wound care following for colostomy care  -IV Zosyn discontinued 2/20, nontoxic-appearing, leukocytosis trending down.    AKI on CKD 5 with progression to ESRD AKI likely hemodynamically mediated-has now likely progressed to ESRD.  HD initiated via right IJ temporary catheter, switched to right IJ trialysis catheter by IR on 11/07/2023>> tunneled HD catheter right IJ on 11/12/2023 by IR. -CLIP in process, but likely she will be going to CIR, would like to hold on permanent access creation for now given her overall clinical picture.   CAD Currently no anginal symptoms Suspect not on antiplatelets as on anticoagulation with Eliquis/heparin Continue beta-blocker On Repatha injections as an outpatient.  PAF Sinus rhythm Continue Coreg Currently on Eliquis  Anemia of acute illness  Anemia of end-stage renal disease Brought on by acute illness, underlying renal insufficiency now developing to ESRD, surgical blood loss, gradually trending  down, 2 units of packed RBC on 11/04/2023, on IV PPI.  -Received 1 unit PRBC 2/19, continue to monitor and transfuse as needed -IV iron and Procrit per renal -Hemoglobin stable, transfuse as needed, monitor closely as she is on Eliquis.  HTN Blood pressure better after starting Norvasc, continue with home dose Coreg  Continue with as needed hydralazine -Should continue to improve with further hemodialysis  DM-2 (A1c 6.6 on 2/3) OffTNA now tolerating oral diet, Semglee and sliding scale adjusted.   Recent Labs    11/17/23 2037 11/18/23 0753 11/18/23 1157  GLUCAP 214* 137* 101*       Palliative care/goals of care Palliative input greatly appreciated Class 1 Obesity: Estimated body mass index is 31.21 kg/m as calculated from the following:   Height as of this encounter: 5' 7.01" (1.702 m).   Weight as of this encounter: 90.4 kg.    Code status:   Code Status: Limited: Do not attempt resuscitation (DNR) -DNR-LIMITED -Do Not Intubate/DNI    DVT Prophylaxis: Place and maintain sequential compression device Start: 11/06/23 0613 apixaban (ELIQUIS) tablet 5 mg IV Heparin  Family Communication: Discussed with patient, all her questions answered.  Disposition Plan: Status is: Inpatient Will benefit from CIR, given multiple issues including her wound, will need frequent monitoring and observation by general surgery team, hemodialysis patient, she is on Eliquis she might need transfusions as well.  With her significant abdominal wound and wound VAC, significant deconditioning when she was independent at baseline, she is With prolonged hospital stay, surgery and significant weakness, will benefit from CIR stay to go back to baseline     Diet: Diet Order             Diet regular Room service appropriate? Yes; Fluid consistency: Thin  Diet effective now                   MEDICATIONS: Scheduled Meds:  amLODipine  5 mg Oral Daily   apixaban  5 mg Oral BID   carvedilol  6.25  mg Oral BID WC   Chlorhexidine Gluconate Cloth  6 each Topical Q0600   darbepoetin (ARANESP) injection - DIALYSIS  60 mcg Subcutaneous Q Mon-1800   insulin aspart  0-15 Units Subcutaneous TID WC   insulin aspart  0-5 Units Subcutaneous QHS   insulin glargine-yfgn  12 Units Subcutaneous Daily   mouth rinse  15 mL Mouth Rinse 4 times per day   pantoprazole  40 mg Oral BID   sodium chloride flush  10-40 mL Intracatheter Q12H   Continuous Infusions:   PRN Meds:.acetaminophen **OR** acetaminophen, albuterol, hydrALAZINE, HYDROmorphone (DILAUDID) injection, menthol-cetylpyridinium, ondansetron (ZOFRAN) IV, mouth rinse, oxyCODONE   I have personally reviewed following labs and imaging studies  LABORATORY DATA:   Data Review:    Recent Labs  Lab 11/12/23 0420 11/13/23 0052 11/14/23 0354 11/15/23 0806 11/18/23 0551  WBC 22.0* 18.8* 15.8* 13.5* 10.7*  HGB 8.0* 7.6* 8.2* 8.6* 9.1*  HCT 24.9* 23.9* 26.0* 26.7* 28.8*  PLT 326 326 306 325 338  MCV 83.0 83.9 84.1 84.2 85.0  MCH 26.7 26.7 26.5 27.1 26.8  MCHC 32.1 31.8 31.5 32.2 31.6  RDW 16.7* 16.8* 16.5* 16.3* 16.9*    Recent Labs  Lab 11/13/23 0052 11/14/23 0354 11/15/23 0806 11/18/23 0551  NA 136 135 136 135  K 4.0 3.7 3.5 3.5  CL 100 97* 99 100  CO2 24 26 24 22   ANIONGAP 12 12 13 13   GLUCOSE 128* 152* 96 118*  BUN 39* 25* 30* 30*  CREATININE 7.04* 5.39* 7.20* 8.20*  ALBUMIN  --   --  1.9* 2.1*  PHOS  --   --  4.9* 4.8*  CALCIUM 7.6* 7.7* 7.8* 7.9*      Recent Labs  Lab 11/13/23 0052 11/14/23 0354 11/15/23 0806 11/18/23 0551  CALCIUM 7.6* 7.7* 7.8* 7.9*    --------------------------------------------------------------------------------------------------------------- Lab Results  Component Value Date   CHOL 134 12/21/2019   HDL 49 12/21/2019   LDLCALC 69 12/21/2019   TRIG 148 11/11/2023   CHOLHDL 2.7 12/21/2019    Lab Results  Component Value Date   HGBA1C 6.6 (H) 10/28/2023      Micro  Results Recent Results (from the past 240 hours)  MRSA Next Gen by PCR, Nasal     Status: None   Collection Time: 11/10/23  5:14 AM   Specimen: Nasal Mucosa; Nasal Swab  Result Value Ref Range Status   MRSA by PCR Next Gen NOT DETECTED NOT DETECTED Final    Comment: (NOTE) The GeneXpert MRSA Assay (FDA approved for NASAL specimens only), is one component of a comprehensive MRSA colonization surveillance program. It is not intended to diagnose MRSA infection nor to guide or monitor treatment for MRSA infections. Test performance is not FDA approved in patients less than 46 years old. Performed at [facility], [address] [place], [place] [address]      Radiology Reports IR Fluoro Guide CV Line Right Result Date: 11/12/2023 CLINICAL DATA:  End-stage renal disease and status post placement of non tunneled temporary dialysis catheter on 11/01/2023. The patient now requires placement of a tunneled dialysis catheter for further longer-term hemodialysis. EXAM: TUNNELED CENTRAL VENOUS HEMODIALYSIS CATHETER PLACEMENT WITH ULTRASOUND AND FLUOROSCOPIC GUIDANCE ANESTHESIA/SEDATION: Moderate (conscious) sedation was employed during this procedure. A total of Versed 1.0 mg and Fentanyl 50 mcg was administered intravenously. Moderate Sedation Time: 16 minutes. The patient's level of consciousness and vital signs were monitored continuously by radiology nursing throughout the procedure under my direct supervision. MEDICATIONS: The patient is receiving IV Rocephin every 8 hours in the hospital and did not receive additional antibiotics for the procedure. FLUOROSCOPY: 24 seconds.  2.3 mGy. PROCEDURE: The procedure, risks, benefits, and alternatives were explained to the patient. Questions regarding the procedure were encouraged and answered. The patient understands and consents to the procedure. A timeout was performed prior to initiating the procedure. A non tunneled temporary right-sided dialysis  catheter was removed prior to the procedure and pressure held at the exit site. The right neck and chest were prepped with chlorhexidine in a sterile fashion, and a sterile drape was applied covering the operative field. Maximum barrier sterile technique with sterile gowns and gloves were used for the procedure. Local anesthesia was provided with 1% lidocaine. Ultrasound was used to confirm patency of the right internal jugular vein. An ultrasound image was saved and recorded. After creating a small venotomy incision, a 21 gauge needle was advanced into the right internal jugular vein under direct, real-time ultrasound guidance. Ultrasound image documentation was performed. After securing guidewire access, an 8 Fr dilator was placed. A J-wire was kinked to measure appropriate catheter length. A Palindrome tunneled hemodialysis catheter measuring 19 cm from tip to cuff was chosen for placement. This was tunneled in a retrograde fashion from the chest wall to the venotomy incision. At the venotomy, serial dilatation was performed and a 15 Fr peel-away sheath was placed over a guidewire. The catheter was then placed through the sheath  and the sheath removed. Final catheter positioning was confirmed and documented with a fluoroscopic spot image. The catheter was aspirated, flushed with saline, and injected with appropriate volume heparin dwells. The venotomy incision was closed with subcuticular 4-0 Vicryl. Dermabond was applied to the incision. The catheter exit site was secured with 0-Prolene retention sutures. COMPLICATIONS: None.  No pneumothorax. FINDINGS: After catheter placement, the tip lies in the right atrium. The catheter aspirates normally and is ready for immediate use. IMPRESSION: Placement of tunneled hemodialysis catheter via new puncture of the right internal jugular vein after removal of the non tunneled catheter. The new catheter tip lies in the right atrium. The catheter is ready for immediate use.  Electronically Signed   By: Irish Lack M.D.   On: 11/12/2023 10:45   IR US Guide Vasc Access Right Result Date: 11/12/2023 CLINICAL DATA:  End-stage renal disease and status post placement of non tunneled temporary dialysis catheter on 11/01/2023. The patient now requires placement of a tunneled dialysis catheter for further longer-term hemodialysis. EXAM: TUNNELED CENTRAL VENOUS HEMODIALYSIS CATHETER PLACEMENT WITH ULTRASOUND AND FLUOROSCOPIC GUIDANCE ANESTHESIA/SEDATION: Moderate (conscious) sedation was employed during this procedure. A total of Versed 1.0 mg and Fentanyl 50 mcg was administered intravenously. Moderate Sedation Time: 16 minutes. The patient's level of consciousness and vital signs were monitored continuously by radiology nursing throughout the procedure under my direct supervision. MEDICATIONS: The patient is receiving IV Rocephin every 8 hours in the hospital and did not receive additional antibiotics for the procedure. FLUOROSCOPY: 24 seconds.  2.3 mGy. PROCEDURE: The procedure, risks, benefits, and alternatives were explained to the patient. Questions regarding the procedure were encouraged and answered. The patient understands and consents to the procedure. A timeout was performed prior to initiating the procedure. A non tunneled temporary right-sided dialysis catheter was removed prior to the procedure and pressure held at the exit site. The right neck and chest were prepped with chlorhexidine in a sterile fashion, and a sterile drape was applied covering the operative field. Maximum barrier sterile technique with sterile gowns and gloves were used for the procedure. Local anesthesia was provided with 1% lidocaine. Ultrasound was used to confirm patency of the right internal jugular vein. An ultrasound image was saved and recorded. After creating a small venotomy incision, a 21 gauge needle was advanced into the right internal jugular vein under direct, real-time ultrasound guidance.  Ultrasound image documentation was performed. After securing guidewire access, an 8 Fr dilator was placed. A J-wire was kinked to measure appropriate catheter length. A Palindrome tunneled hemodialysis catheter measuring 19 cm from tip to cuff was chosen for placement. This was tunneled in a retrograde fashion from the chest wall to the venotomy incision. At the venotomy, serial dilatation was performed and a 15 Fr peel-away sheath was placed over a guidewire. The catheter was then placed through the sheath and the sheath removed. Final catheter positioning was confirmed and documented with a fluoroscopic spot image. The catheter was aspirated, flushed with saline, and injected with appropriate volume heparin dwells. The venotomy incision was closed with subcuticular 4-0 Vicryl. Dermabond was applied to the incision. The catheter exit site was secured with 0-Prolene retention sutures. COMPLICATIONS: None.  No pneumothorax. FINDINGS: After catheter placement, the tip lies in the right atrium. The catheter aspirates normally and is ready for immediate use. IMPRESSION: Placement of tunneled hemodialysis catheter via new puncture of the right internal jugular vein after removal of the non tunneled catheter. The new catheter tip lies in the  right atrium. The catheter is ready for immediate use. Electronically Signed   By: Irish Lack M.D.   On: 11/12/2023 10:45   IR US Guide Vasc Access Left Result Date: 11/08/2023 INDICATION: ESRD.  IV antibiotics. EXAM: ULTRASOUND AND FLUOROSCOPIC GUIDED PLACEMENT OF TUNNELED CENTRAL VENOUS "Powerline" CATHETER MEDICATIONS: The patient was on scheduled IV antibiotics. ANESTHESIA/SEDATION: Local anesthetic was administered. The patient was continuously monitored during the procedure by the interventional radiology nurse under my direct supervision. FLUOROSCOPY TIME:  Fluoroscopic dose; 3.8 mGy COMPLICATIONS: None immediate. PROCEDURE: Informed written consent was obtained from  the patient and/or patient's representative after a discussion of the risks, benefits, and alternatives to treatment. Questions regarding the procedure were encouraged and answered. The LEFT neck and chest were prepped with chlorhexidine in a sterile fashion, and a sterile drape was applied covering the operative field. Maximum barrier sterile technique with sterile gowns and gloves were used for the procedure. A timeout was performed prior to the initiation of the procedure. After the overlying soft tissues were anesthetized, a small venotomy incision was created and a micropuncture kit was utilized to access the internal jugular vein. Real-time ultrasound guidance was utilized for vascular access including the acquisition of a permanent ultrasound image documenting patency of the accessed vessel. The microwire was utilized to measure appropriate catheter length. The micropuncture sheath was exchanged for a peel-away sheath over a guidewire. A 5 Fr dual lumen tunneled central venous catheter measuring 33 cm was tunneled in a retrograde fashion from the anterior chest wall to the venotomy incision. The catheter was then placed through the peel-away sheath with tip ultimately positioned at the superior caval-atrial junction. Final catheter positioning was confirmed and documented with a spot radiographic image. The catheter aspirates and flushes normally. The catheter was flushed with appropriate volume heparin dwells. The catheter exit site was secured with a 3-0 Ethilon retention suture. The venotomy incision was closed with Dermabond. Dressings were applied. The patient tolerated the procedure well without immediate post procedural complication. FINDINGS: After catheter placement, the tip lies within the superior apect of the right atrium. The catheter aspirates and flushes normally and is ready for immediate use. IMPRESSION: Successful placement of 33 cm dual lumen tunneled "Powerline" central venous catheter via  the LEFT internal jugular vein The tip of the catheter is positioned within the proximal RIGHT atrium. The catheter is ready for immediate use. Roanna Banning, MD [facility] Radiology Electronically Signed   By: Roanna Banning M.D.   On: 11/08/2023 18:10   IR Fluoro Guide CV Line Left Result Date: 11/08/2023 INDICATION: ESRD.  IV antibiotics. EXAM: ULTRASOUND AND FLUOROSCOPIC GUIDED PLACEMENT OF TUNNELED CENTRAL VENOUS "Powerline" CATHETER MEDICATIONS: The patient was on scheduled IV antibiotics. ANESTHESIA/SEDATION: Local anesthetic was administered. The patient was continuously monitored during the procedure by the interventional radiology nurse under my direct supervision. FLUOROSCOPY TIME:  Fluoroscopic dose; 3.8 mGy COMPLICATIONS: None immediate. PROCEDURE: Informed written consent was obtained from the patient and/or patient's representative after a discussion of the risks, benefits, and alternatives to treatment. Questions regarding the procedure were encouraged and answered. The LEFT neck and chest were prepped with chlorhexidine in a sterile fashion, and a sterile drape was applied covering the operative field. Maximum barrier sterile technique with sterile gowns and gloves were used for the procedure. A timeout was performed prior to the initiation of the procedure. After the overlying soft tissues were anesthetized, a small venotomy incision was created and a micropuncture kit was utilized  to access the internal jugular vein. Real-time ultrasound guidance was utilized for vascular access including the acquisition of a permanent ultrasound image documenting patency of the accessed vessel. The microwire was utilized to measure appropriate catheter length. The micropuncture sheath was exchanged for a peel-away sheath over a guidewire. A 5 Fr dual lumen tunneled central venous catheter measuring 33 cm was tunneled in a retrograde fashion from the anterior chest  wall to the venotomy incision. The catheter was then placed through the peel-away sheath with tip ultimately positioned at the superior caval-atrial junction. Final catheter positioning was confirmed and documented with a spot radiographic image. The catheter aspirates and flushes normally. The catheter was flushed with appropriate volume heparin dwells. The catheter exit site was secured with a 3-0 Ethilon retention suture. The venotomy incision was closed with Dermabond. Dressings were applied. The patient tolerated the procedure well without immediate post procedural complication. FINDINGS: After catheter placement, the tip lies within the superior apect of the right atrium. The catheter aspirates and flushes normally and is ready for immediate use. IMPRESSION: Successful placement of 33 cm dual lumen tunneled "Powerline" central venous catheter via the LEFT internal jugular vein The tip of the catheter is positioned within the proximal RIGHT atrium. The catheter is ready for immediate use. Roanna Banning, MD [facility] Radiology Electronically Signed   By: Roanna Banning M.D.   On: 11/08/2023 18:10   VAS [place] LOWER EXTREMITY VENOUS (DVT) Result Date: 11/07/2023  Lower Venous DVT Study Patient Name:  SARANYA HARLIN [facility]  Date of Exam:   11/07/2023 Medical Rec #: [id number]          Accession #:    [id number] Date of Birth: 06/13/1956          Patient Gender: F Patient Age:   25 years Exam Location:  [facility] Procedure:      VAS [place] LOWER EXTREMITY VENOUS (DVT) Referring Phys: Tresa Endo OSBORNE --------------------------------------------------------------------------------  Indications: Pain, stroke, and A-fib, anemia.  Comparison Study: No prior exam. Performing Technologist: Fernande Bras  Examination Guidelines: A complete evaluation includes B-mode imaging, spectral Doppler, color Doppler, and power Doppler as needed of all accessible portions of each  vessel. Bilateral testing is considered an integral part of a complete examination. Limited examinations for reoccurring indications may be performed as noted. The reflux portion of the exam is performed with the patient in reverse Trendelenburg.  +---------+---------------+---------+-----------+----------+--------------+ RIGHT    CompressibilityPhasicitySpontaneityPropertiesThrombus Aging +---------+---------------+---------+-----------+----------+--------------+ CFV      Full           Yes      Yes                                 +---------+---------------+---------+-----------+----------+--------------+ SFJ      Full           Yes      Yes                                 +---------+---------------+---------+-----------+----------+--------------+ FV Prox  Full                                                        +---------+---------------+---------+-----------+----------+--------------+ FV  Mid   Full                                                        +---------+---------------+---------+-----------+----------+--------------+ FV DistalFull                                                        +---------+---------------+---------+-----------+----------+--------------+ PFV      Full                                                        +---------+---------------+---------+-----------+----------+--------------+ POP      Full           Yes      Yes                                 +---------+---------------+---------+-----------+----------+--------------+ PTV      Full                                                        +---------+---------------+---------+-----------+----------+--------------+ PERO     Full                                                        +---------+---------------+---------+-----------+----------+--------------+   +---------+---------------+---------+-----------+----------+--------------+ LEFT      CompressibilityPhasicitySpontaneityPropertiesThrombus Aging +---------+---------------+---------+-----------+----------+--------------+ CFV      Full           Yes      Yes                                 +---------+---------------+---------+-----------+----------+--------------+ SFJ      Full           Yes      Yes                                 +---------+---------------+---------+-----------+----------+--------------+ FV Prox  Full                                                        +---------+---------------+---------+-----------+----------+--------------+ FV Mid   Full                                                        +---------+---------------+---------+-----------+----------+--------------+  FV DistalFull                                                        +---------+---------------+---------+-----------+----------+--------------+ PFV      Full                                                        +---------+---------------+---------+-----------+----------+--------------+ POP      Full           Yes      Yes                                 +---------+---------------+---------+-----------+----------+--------------+ PTV      Full                                                        +---------+---------------+---------+-----------+----------+--------------+ PERO     Full                                                        +---------+---------------+---------+-----------+----------+--------------+     Summary: BILATERAL: - No evidence of deep vein thrombosis seen in the lower extremities, bilaterally. -No evidence of popliteal cyst, bilaterally.   *See table(s) above for measurements and observations. Electronically signed by Heath Lark on 11/07/2023 at 4:05:14 PM.    Final    CT ABDOMEN PELVIS WO CONTRAST Result Date: 11/06/2023 CLINICAL DATA:  Diverticulitis, complication suspected EXAM: CT ABDOMEN AND PELVIS WITHOUT  CONTRAST TECHNIQUE: Multidetector CT imaging of the abdomen and pelvis was performed following the standard protocol without IV contrast. RADIATION DOSE REDUCTION: This exam was performed according to the departmental dose-optimization program which includes automated exposure control, adjustment of the mA and/or kV according to patient size and/or use of iterative reconstruction technique. COMPARISON:  10/31/2023 FINDINGS: Lower chest: Volume loss and opacity in the lower lungs consistent with atelectasis. There is a central line with tip at the upper right atrium. Coronary atherosclerosis. Hepatobiliary: No focal liver abnormality. Calcific in the intermediate density in the gallbladder attributed to stones. No biliary dilatation or pericholecystic edema. Pancreas: Unremarkable. Spleen: Unremarkable. Adrenals/Urinary Tract: Negative adrenals. No hydronephrosis or stone. Renal cortical thinning and lobulation at the left upper pole. 3 mm stone at the upper pole left kidney. The urinary bladder is collapsed around a Foley catheter. Stomach/Bowel: Descending colostomy. Bowel anastomosis in the right abdomen. No evidence of bowel obstruction. Fluid collection with loculation appearance/peritoneal thickening along the left flank measuring up to 5 x 6 x 10 cm. Milder fluid accumulation in the ventral peritoneal space. Vascular/Lymphatic: Extensive atheromatous calcification of the aorta and iliacs. No mass or adenopathy. Reproductive:No pathologic findings. Other: No ascites or pneumoperitoneum. Musculoskeletal: No acute abnormalities.  T9 butterfly vertebra IMPRESSION: 1. Interval colostomy with fluid collection along the  left gutter, dominant pocket measuring 10 x 6 x 5 cm. 2. No bowel obstruction. 3. Cholelithiasis and left nephrolithiasis. Electronically Signed   By: Tiburcio Pea M.D.   On: 11/06/2023 07:12   DG Chest Port 1 View Result Date: 11/03/2023 CLINICAL DATA:  Shortness of breath EXAM: PORTABLE CHEST -  1 VIEW COMPARISON:  11/11/2018 FINDINGS: Unchanged mild cardiomegaly and pulmonary vascular congestion. Interval worsening of left basilar opacity likely due to atelectasis. Lungs otherwise clear. Examination is limited due to expiratory phase of imaging. Interval placement of temporary right IJ hemodialysis catheter which terminates in the region of the right atrium. Nasogastric tube terminates in the left upper quadrant. IMPRESSION: Interval worsening of left basilar opacity which is likely due to atelectasis. Electronically Signed   By: Acquanetta Belling M.D.   On: 11/03/2023 07:22   IR Fluoro Guide CV Line Right Result Date: 11/01/2023 INDICATION: ESRD requiring HD. EXAM: NON-TUNNELED CENTRAL VENOUS HEMODIALYSIS CATHETER PLACEMENT WITH ULTRASOUND AND FLUOROSCOPIC GUIDANCE COMPARISON:  CT AP 10/31/2023. MEDICATIONS: Local anesthetic was administered. FLUOROSCOPY TIME:  Fluoroscopic dose; 35 mGy COMPLICATIONS: None immediate. PROCEDURE: Informed written consent was obtained from the patient and/or patient's representative after a discussion of the risks, benefits, and alternatives to treatment. Questions regarding the procedure were encouraged and answered. The RIGHT neck and chest were prepped with chlorhexidine in a sterile fashion, and a sterile drape was applied covering the operative field. Maximum barrier sterile technique with sterile gowns and gloves were used for the procedure. A timeout was performed prior to the initiation of the procedure. After the overlying soft tissues were anesthetized, a small venotomy incision was created and a micropuncture kit was utilized to access the external jugular vein. Real-time ultrasound guidance was utilized for vascular access including the acquisition of a permanent ultrasound image documenting patency of the accessed vessel. The microwire was utilized to measure appropriate catheter length. A stiff glidewire was advanced to the level of the IVC. Under fluoroscopic  guidance, the venotomy was serially dilated, ultimately allowing placement of a 20 cm temporary Trialysis catheter with tip ultimately terminating within the superior aspect of the right atrium. Final catheter positioning was confirmed and documented with a spot radiographic image. The catheter aspirates and flushes normally. The catheter was flushed with appropriate volume heparin dwells. The catheter exit site was secured with a 2-0 Ethilon retention suture. A dressing was placed. The patient tolerated the procedure well without immediate post procedural complication. IMPRESSION: Successful placement of a RIGHT EXTERNAL jugular approach 20 cm non-tunneled dialysis catheter The tip of the catheter is positioned within the proximal RIGHT atrium. The catheter is ready for immediate use. PLAN: This catheter may be converted to a tunneled dialysis catheter at a later date as indicated. Roanna Banning, MD [facility] Electronically Signed   By: Roanna Banning M.D.   On: 11/01/2023 18:15   IR US Guide Vasc Access Right Result Date: 11/01/2023 INDICATION: ESRD requiring HD. EXAM: NON-TUNNELED CENTRAL VENOUS HEMODIALYSIS CATHETER PLACEMENT WITH ULTRASOUND AND FLUOROSCOPIC GUIDANCE COMPARISON:  CT AP 10/31/2023. MEDICATIONS: Local anesthetic was administered. FLUOROSCOPY TIME:  Fluoroscopic dose; 35 mGy COMPLICATIONS: None immediate. PROCEDURE: Informed written consent was obtained from the patient and/or patient's representative after a discussion of the risks, benefits, and alternatives to treatment. Questions regarding the procedure were encouraged and answered. The RIGHT neck and chest were prepped with chlorhexidine in a sterile fashion, and a sterile drape was applied covering the operative field. Maximum barrier sterile technique with  sterile gowns and gloves were used for the procedure. A timeout was performed prior to the initiation of the procedure. After the  overlying soft tissues were anesthetized, a small venotomy incision was created and a micropuncture kit was utilized to access the external jugular vein. Real-time ultrasound guidance was utilized for vascular access including the acquisition of a permanent ultrasound image documenting patency of the accessed vessel. The microwire was utilized to measure appropriate catheter length. A stiff glidewire was advanced to the level of the IVC. Under fluoroscopic guidance, the venotomy was serially dilated, ultimately allowing placement of a 20 cm temporary Trialysis catheter with tip ultimately terminating within the superior aspect of the right atrium. Final catheter positioning was confirmed and documented with a spot radiographic image. The catheter aspirates and flushes normally. The catheter was flushed with appropriate volume heparin dwells. The catheter exit site was secured with a 2-0 Ethilon retention suture. A dressing was placed. The patient tolerated the procedure well without immediate post procedural complication. IMPRESSION: Successful placement of a RIGHT EXTERNAL jugular approach 20 cm non-tunneled dialysis catheter The tip of the catheter is positioned within the proximal RIGHT atrium. The catheter is ready for immediate use. PLAN: This catheter may be converted to a tunneled dialysis catheter at a later date as indicated. Roanna Banning, MD [facility] Electronically Signed   By: Roanna Banning M.D.   On: 11/01/2023 18:15   CT ABDOMEN PELVIS WO CONTRAST Result Date: 10/31/2023 CLINICAL DATA:  Diverticulitis, complication suspected. EXAM: CT ABDOMEN AND PELVIS WITHOUT CONTRAST TECHNIQUE: Multidetector CT imaging of the abdomen and pelvis was performed following the standard protocol without IV contrast. RADIATION DOSE REDUCTION: This exam was performed according to the departmental dose-optimization program which includes automated exposure control,  adjustment of the mA and/or kV according to patient size and/or use of iterative reconstruction technique. COMPARISON:  10/27/2023. FINDINGS: Lower chest: Heart is enlarged and multi-vessel coronary artery calcifications are noted. Consolidation and patchy airspace disease is present at the lung bases bilaterally. Hepatobiliary: No focal liver abnormality is seen. Stones are present within the gallbladder. No biliary ductal dilatation. Pancreas: Unremarkable. No pancreatic ductal dilatation or surrounding inflammatory changes. Spleen: Normal in size without focal abnormality. Adrenals/Urinary Tract: The adrenal glands are within normal limits. Calcifications are present in the upper pole of the left kidney. There is a cyst in the lower pole of the right kidney. No hydronephrosis bilaterally. The bladder is unremarkable. Stomach/Bowel: The stomach is within normal limits. No bowel obstruction or pneumatosis is seen. Right hemicolectomy changes are noted. There is diffuse colonic wall thickening. Scattered diverticula are present along the colon. A few small foci of free air are noted in the mesentery in the mid left abdomen, slightly increased from the previous exam. There is suggestion of contrast extravasation in the region. There is fine no definite abscess is seen. Vascular/Lymphatic: Aortic atherosclerosis. No enlarged abdominal or pelvic lymph nodes. Reproductive: No acute abnormality. Other: Mild ascites is noted in all 4 quadrants. A fat containing umbilical hernia is noted. There is mild anasarca. Musculoskeletal: The bony structures are stable. IMPRESSION: 1. Worsening diverticulitis of the descending colon with perforation and increasing free air and suspected oral contrast extravasation in the mid left abdomen. No definite abscess is seen. Surgical consultation is recommended. 2. Mild ascites. 3. Diffuse colonic wall thickening which may be due to associated inflammatory changes. 4. Patchy airspace  disease at the lung bases with bilateral lower lobe consolidation. 5. Cholelithiasis.  6. Aortic atherosclerosis. Critical Value/emergent results were called by telephone at the time of interpretation on 10/31/2023 at 11:59 pm to provider Dr. Antionette Char, who verbally acknowledged these results. Electronically Signed   By: Thornell Sartorius M.D.   On: 10/31/2023 23:59   CT ABDOMEN PELVIS WO CONTRAST Result Date: 10/27/2023 CLINICAL DATA:  Acute nonlocalized abdominal pain. Chills and fever. Vomiting and diarrhea. EXAM: CT ABDOMEN AND PELVIS WITHOUT CONTRAST TECHNIQUE: Multidetector CT imaging of the abdomen and pelvis was performed following the standard protocol without IV contrast. RADIATION DOSE REDUCTION: This exam was performed according to the departmental dose-optimization program which includes automated exposure control, adjustment of the mA and/or kV according to patient size and/or use of iterative reconstruction technique. COMPARISON:  CT abdomen and pelvis 04/19/2021 FINDINGS: Lower chest: Bibasilar atelectasis.  No acute abnormality. Hepatobiliary: Cholelithiasis without evidence of acute cholecystitis. No biliary dilation. Unremarkable noncontrast appearance of the liver. Pancreas: Unremarkable. Spleen: Unremarkable. Adrenals/Urinary Tract: Normal adrenal glands. No urinary calculi or hydronephrosis. Unremarkable bladder. Stomach/Bowel: Stomach is within normal limits. No bowel obstruction. Colonic diverticulosis. Wall thickening, inflammatory stranding, and free fluid about the descending colon. Loosely organized fluid and gas about the inflamed descending colon (circa series 3/image 49-50) compatible with contained perforation. The adjacent jejunum is inflamed. Postoperative change about the colon with ileocolonic anastomosis in the right upper quadrant. Vascular/Lymphatic: Aortic atherosclerosis. No enlarged abdominal or pelvic lymph nodes. Reproductive: No acute abnormality. Other: Fat containing  periumbilical hernia. Musculoskeletal: No acute fracture. IMPRESSION: 1. Acute diverticulitis of the descending colon with contained perforation. 2. Inflamed jejunum adjacent to the perforated diverticulitis. 3.  Aortic Atherosclerosis (ICD10-I70.0). Critical Value/emergent results were called by telephone at the time of interpretation on 10/27/2023 at 9:22 pm to provider [facility] , who verbally acknowledged these results. Electronically Signed   By: Minerva Fester M.D.   On: 10/27/2023 21:24    Signature  -   Huey Bienenstock M.D on 11/18/2023 at 12:17 PM   -  To page go to [email or web address]

## 2023-11-18 NOTE — Procedures (Signed)
 I was present at this dialysis session. I have reviewed the session itself and made appropriate changes.   Filed Weights   11/15/23 0759 11/15/23 1324 11/18/23 1448  Weight: 93.4 kg 90.4 kg 89.8 kg    Recent Labs  Lab 11/18/23 0551  NA 135  K 3.5  CL 100  CO2 22  GLUCOSE 118*  BUN 30*  CREATININE 8.20*  CALCIUM 7.9*  PHOS 4.8*    Recent Labs  Lab 11/14/23 0354 11/15/23 0806 11/18/23 0551  WBC 15.8* 13.5* 10.7*  HGB 8.2* 8.6* 9.1*  HCT 26.0* 26.7* 28.8*  MCV 84.1 84.2 85.0  PLT 306 325 338    Scheduled Meds:  amLODipine  5 mg Oral Daily   apixaban  5 mg Oral BID   carvedilol  6.25 mg Oral BID WC   Chlorhexidine Gluconate Cloth  6 each Topical Q0600   darbepoetin (ARANESP) injection - DIALYSIS  60 mcg Subcutaneous Q Mon-1800   insulin aspart  0-15 Units Subcutaneous TID WC   insulin aspart  0-5 Units Subcutaneous QHS   insulin glargine-yfgn  12 Units Subcutaneous Daily   mouth rinse  15 mL Mouth Rinse 4 times per day   pantoprazole  40 mg Oral BID   sodium chloride flush  10-40 mL Intracatheter Q12H   Continuous Infusions: PRN Meds:.acetaminophen **OR** acetaminophen, albuterol, hydrALAZINE, HYDROmorphone (DILAUDID) injection, menthol-cetylpyridinium, ondansetron (ZOFRAN) IV, mouth rinse, oxyCODONE   Anthony Sar, MD [facility] 11/18/2023, 3:11 PM

## 2023-11-18 NOTE — Progress Notes (Signed)
 Inpatient Rehab Admissions Coordinator:   Awaiting on expedited appeal decision. Hopeful to hear today.   Estill Dooms, PT, DPT Admissions Coordinator [phone] 11/18/23  11:25 AM

## 2023-11-18 NOTE — Consult Note (Addendum)
 WOC Nurse wound follow up Wound type: midline surgical dehiscence Measurement: 18 cm x 6 cm x 2.5 cm  Wound bed: 100% red Drainage (amount, consistency, odor)  milky appearing effluent in canister 100 ml today at 1330. Periwound: intact, has a crease at 3 o'clock between wound and ostomy  Dressing procedure/placement/frequency: Removed old NPWT dressing Cleansed wound with normal saline Barrier ring placed 1/2 at 3 o'clock and 1/2 at 9 o'clock crease.  Filled wound with 1 pieces black foam   Sealed NPWT dressing at HG Patient received IV pain medication per bedside nurse prior to dressing change Patient tolerated procedure well   WOC nurse will continue to provide NPWT dressing changes due to the complexity of the dressing change. Next change Thursday    WOC Nurse ostomy follow up Stoma type/location: LMQ colostoy  Stomal assessment/size: oval 1 3/8" pink moist, slightly above skin level  Peristomal assessment: intact but midline incision with NPWT to right as above  Treatment options for stomal/peristomal skin: 2" barrier ring  Output approximately 80 dark brown effluent in pouch  Ostomy pouching: 1pc. Convex [employer] [place] #[id number], and barrier ring [phone] Education provided: The pt today change the pouch system with hands on, sitting on the bed. She want to learn because she will do at home with no help, according to her. Also discussed changing entire pouching system 2 times a week or as needed for leaking.     - The pouch need to be change twice per week or if is leaking. - Clean the skin with soup and water, or just water. Keep in mind that any product can stay at the skin before apply the next pouch to have a better seal.   - Cut the barrier with the size and shape as the ostomy has. In this case, round 32 mm - Take the protect plastic off the barrier. That can be use as a model for the next pouch system. The ostomy will change the size to a smaller one on the next weeks, keep  measuring at least once a week to make sure if the size is correct.   - Apply on the skin, stretching a little the bottom of the ostomy site to apply correctly. - The warmed skin will interact with the barrier, making it sealed.   - To empty the pouch, need to be several times per day, when becomes 1/3 full. - Teach how to open and close the lock in roll system. - A bag with filter will provide a release the gas without odor, does not need to protect against the water.    Patient has 1 piece flexible convex pouches and barrier rings in room.    WOC team will continue to follow for NPWT and education and support of ostomy.   Ordered supplies for room   Thank-you,  Denyse Amass BSN, RN, [employer], WOC  (Pager: [phone])

## 2023-11-18 NOTE — Progress Notes (Signed)
 Physical Therapy Treatment Patient Details Name: [name] MRN: [id number] DOB: [birth year] Today's Date: 11/18/2023   History of Present Illness Patient is a 68 y.o.  female - who presented with abdominal pain-found to have acute diverticulitis with contained perforation involving the descending colon. 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  Past medical history of CKD 5, A-fib on Eliquis, CAD    PT Comments  Pt tolerated treatment well today. Pt received in hallway with mobility specialist. Pt able to progress ambulation distance today however continues to be limited by decreased strength and endurance. No change in DC/DME recs at this time. PT will continue to follow.     If plan is discharge home, recommend the following: A little help with walking and/or transfers;A little help with bathing/dressing/bathroom;Assistance with cooking/housework;Assist for transportation;Help with stairs or ramp for entrance   Can travel by private vehicle        Equipment Recommendations  Other (comment) (Per accepting facility)    Recommendations for Other Services       Precautions / Restrictions Precautions Precautions: Fall Recall of Precautions/Restrictions: Intact Precaution/Restrictions Comments: Wound vac Restrictions Weight Bearing Restrictions Per Provider Order: No     Mobility  Bed Mobility Overal bed mobility: Needs Assistance Bed Mobility: Sit to Supine       Sit to supine: Contact guard assist   General bed mobility comments: Pt received in hallway with mobility specialist.    Transfers Overall transfer level: Needs assistance Equipment used: Rolling walker (2 wheels) Transfers: Sit to/from Stand Sit to Stand: Min assist           General transfer comment: pt with good hand placement. Min A to power up and steady from rolling chair.    Ambulation/Gait Ambulation/Gait assistance: Min assist, +2 safety/equipment (Chair follow) Gait Distance  (Feet): 75 Feet Assistive device: Rolling walker (2 wheels) Gait Pattern/deviations: Wide base of support, Drifts right/left, Decreased stride length, Step-through pattern, Trunk flexed Gait velocity: decreased     General Gait Details: 1 seated rest break. Min A for obstacle navigation. Cues for proximity to RW. Chair follow provided   Stairs             Wheelchair Mobility     Tilt Bed    Modified Rankin (Stroke Patients Only)       Balance Overall balance assessment: Needs assistance Sitting-balance support: Feet supported, Bilateral upper extremity supported Sitting balance-Leahy Scale: Good Sitting balance - Comments: static sitting EOB   Standing balance support: Bilateral upper extremity supported, Single extremity supported, No upper extremity supported Standing balance-Leahy Scale: Fair                              Hotel manager: No apparent difficulties  Cognition Arousal: Alert Behavior During Therapy: WFL for tasks assessed/performed   PT - Cognitive impairments: No apparent impairments                         Following commands: Intact      Cueing Cueing Techniques: Verbal cues  Exercises      General Comments General comments (skin integrity, edema, etc.): VSS on RA      Pertinent Vitals/Pain Pain Assessment Pain Assessment: No/denies pain    Home Living  Prior Function            PT Goals (current goals can now be found in the care plan section) Progress towards PT goals: Progressing toward goals    Frequency    Min 1X/week      PT Plan      Co-evaluation              AM-PAC PT "6 Clicks" Mobility   Outcome Measure  Help needed turning from your back to your side while in a flat bed without using bedrails?: None Help needed moving from lying on your back to sitting on the side of a flat bed without using bedrails?: A  Little Help needed moving to and from a bed to a chair (including a wheelchair)?: A Little Help needed standing up from a chair using your arms (e.g., wheelchair or bedside chair)?: A Little Help needed to walk in hospital room?: A Little Help needed climbing 3-5 steps with a railing? : Total 6 Click Score: 17    End of Session Equipment Utilized During Treatment: Gait belt Activity Tolerance: Patient tolerated treatment well Patient left: in bed;with call bell/phone within reach Nurse Communication: Mobility status PT Visit Diagnosis: Other abnormalities of gait and mobility (R26.89)     Time: 5409-8119 PT Time Calculation (min) (ACUTE ONLY): 8 min  Charges:    $Gait Training: 8-22 mins PT General Charges $$ ACUTE PT VISIT: 1 Visit                     Shela Nevin, PT, DPT Acute Rehab Services [phone]    [name] 11/18/2023, 1:25 PM

## 2023-11-19 ENCOUNTER — Inpatient Hospital Stay (HOSPITAL_COMMUNITY): Payer: Medicare PPO

## 2023-11-19 DIAGNOSIS — K5732 Diverticulitis of large intestine without perforation or abscess without bleeding: Secondary | ICD-10-CM | POA: Diagnosis not present

## 2023-11-19 LAB — RENAL FUNCTION PANEL
Albumin: 2.2 g/dL — ABNORMAL LOW (ref 3.5–5.0)
Anion gap: 11 (ref 5–15)
BUN: 17 mg/dL (ref 8–23)
CO2: 26 mmol/L (ref 22–32)
Calcium: 8.2 mg/dL — ABNORMAL LOW (ref 8.9–10.3)
Chloride: 97 mmol/L — ABNORMAL LOW (ref 98–111)
Creatinine, Ser: 6.15 mg/dL — ABNORMAL HIGH (ref 0.44–1.00)
GFR, Estimated: 7 mL/min — ABNORMAL LOW (ref >60)
Glucose, Bld: 88 mg/dL (ref 70–99)
Phosphorus: 4.3 mg/dL (ref 2.5–4.6)
Potassium: 3.5 mmol/L (ref 3.5–5.1)
Sodium: 134 mmol/L — ABNORMAL LOW (ref 135–145)

## 2023-11-19 LAB — GLUCOSE, CAPILLARY
Glucose-Capillary: 85 mg/dL (ref 70–99)
Glucose-Capillary: 101 mg/dL — ABNORMAL HIGH (ref 70–99)
Glucose-Capillary: 155 mg/dL — ABNORMAL HIGH (ref 70–99)
Glucose-Capillary: 186 mg/dL — ABNORMAL HIGH (ref 70–99)

## 2023-11-19 LAB — CBC
HCT: 31.1 % — ABNORMAL LOW (ref 36.0–46.0)
Hemoglobin: 10.1 g/dL — ABNORMAL LOW (ref 12.0–15.0)
MCH: 27.3 pg (ref 26.0–34.0)
MCHC: 32.5 g/dL (ref 30.0–36.0)
MCV: 84.1 fL (ref 80.0–100.0)
Platelets: 227 10*3/uL (ref 150–400)
RBC: 3.7 MIL/uL — ABNORMAL LOW (ref 3.87–5.11)
RDW: 16.8 % — ABNORMAL HIGH (ref 11.5–15.5)
WBC: 10.2 10*3/uL (ref 4.0–10.5)
nRBC: 0 % (ref 0.0–0.2)

## 2023-11-19 MED ORDER — POLYETHYLENE GLYCOL 3350 17 G PO PACK
17.0000 g | PACK | Freq: Every day | ORAL | Status: DC
  Administered 2023-11-19 – 2023-11-20 (×2): 17 g via ORAL
  Filled 2023-11-19 (×2): qty 1

## 2023-11-19 MED ORDER — DOCUSATE SODIUM 100 MG PO CAPS
100.0000 mg | ORAL_CAPSULE | Freq: Two times a day (BID) | ORAL | Status: DC
  Administered 2023-11-19 – 2023-11-21 (×6): 100 mg via ORAL
  Filled 2023-11-19 (×8): qty 1

## 2023-11-19 MED ORDER — MELATONIN 5 MG PO TABS
10.0000 mg | ORAL_TABLET | Freq: Every evening | ORAL | Status: DC | PRN
  Administered 2023-11-19 – 2023-11-23 (×5): 10 mg via ORAL
  Filled 2023-11-19 (×5): qty 2

## 2023-11-19 MED ORDER — MELATONIN 5 MG PO TABS
5.0000 mg | ORAL_TABLET | Freq: Every evening | ORAL | Status: DC | PRN
  Administered 2023-11-19: 5 mg via ORAL
  Filled 2023-11-19: qty 1

## 2023-11-19 MED ORDER — SENNA 8.6 MG PO TABS
1.0000 | ORAL_TABLET | Freq: Every day | ORAL | Status: DC
  Administered 2023-11-19 – 2023-11-21 (×3): 8.6 mg via ORAL
  Filled 2023-11-19 (×5): qty 1

## 2023-11-19 MED ORDER — INSULIN GLARGINE 100 UNIT/ML ~~LOC~~ SOLN
12.0000 [IU] | SUBCUTANEOUS | Status: DC
Start: 2023-11-19 — End: 2023-11-25
  Administered 2023-11-19 – 2023-11-25 (×7): 12 [IU] via SUBCUTANEOUS
  Filled 2023-11-19 (×7): qty 0.12

## 2023-11-19 NOTE — Progress Notes (Signed)
 Progress Note  17 Days Post-Op  Subjective: Had achy abdominal pain starting in HD last evening. No nausea and improved with pain medications. She had ostomy output yesterday but none since pain started. She is too nervous to eat since pain started.   Objective: Vital signs in last 24 hours: Temp:  [97.8 F (36.6 C)-99.6 F (37.6 C)] 98.3 F (36.8 C) (02/25 1106) Pulse Rate:  [69-82] 71 (02/25 1106) Resp:  [12-22] 12 (02/25 1106) BP: (115-169)/(51-71) 121/55 (02/25 1106) SpO2:  [92 %-99 %] 96 % (02/25 1106) Weight:  [87.2 kg-90 kg] 90 kg (02/25 0529) Last BM Date : 11/19/23  Intake/Output from previous day: 02/24 0701 - 02/25 0700 In: -  Out: 2940 [Stool:240] Intake/Output this shift: No intake/output data recorded.  PE: Abd: soft, appropriately ttp, midline wound with vac in place with good suction. Colostomy with stoma pink and viable. No gas or air in bag   Lab Results:  Recent Labs    11/18/23 0551 11/19/23 0834  WBC 10.7* 10.2  HGB 9.1* 10.1*  HCT 28.8* 31.1*  PLT 338 227   BMET Recent Labs    11/18/23 0551 11/19/23 0834  NA 135 134*  K 3.5 3.5  CL 100 97*  CO2 22 26  GLUCOSE 118* 88  BUN 30* 17  CREATININE 8.20* 6.15*  CALCIUM 7.9* 8.2*   PT/INR No results for input(s): "LABPROT", "INR" in the last 72 hours. CMP     Component Value Date/Time   NA 134 (L) 11/19/2023 0834   NA 137 02/28/2021 1548   K 3.5 11/19/2023 0834   CL 97 (L) 11/19/2023 0834   CO2 26 11/19/2023 0834   GLUCOSE 88 11/19/2023 0834   BUN 17 11/19/2023 0834   BUN 33 (H) 02/28/2021 1548   CREATININE 6.15 (H) 11/19/2023 0834   CREATININE 3.38 (H) 12/26/2015 1347   CALCIUM 8.2 (L) 11/19/2023 0834   PROT 5.8 (L) 11/11/2023 0327   PROT 7.0 11/07/2017 1414   ALBUMIN 2.2 (L) 11/19/2023 0834   ALBUMIN 4.0 11/07/2017 1414   AST 23 11/11/2023 0327   ALT 17 11/11/2023 0327   ALKPHOS 39 11/11/2023 0327   BILITOT 0.5 11/11/2023 0327   BILITOT 0.2 11/07/2017 1414   GFRNONAA 7  (L) 11/19/2023 0834   GFRNONAA 13 (L) 01/10/2015 1428   GFRAA 21 (L) 12/21/2019 1802   GFRAA 16 (L) 01/10/2015 1428   Lipase     Component Value Date/Time   LIPASE 26 10/27/2023 1936       Studies/Results: DG Abd Portable 1V Result Date: 11/19/2023 CLINICAL DATA:  Abdominal pain.  Colostomy back in place. EXAM: PORTABLE ABDOMEN - 1 VIEW COMPARISON:  CT dated 11/06/2023. FINDINGS: No bowel dilatation or evidence of obstruction. There is paucity of bowel air. Anastomotic staple line noted in the right lower quadrant. No free air. The osseous structures are intact. The soft tissues are unremarkable. IMPRESSION: Nonobstructive bowel gas pattern. Electronically Signed   By: Elgie Collard M.D.   On: 11/19/2023 09:59     Anti-infectives: Anti-infectives (From admission, onward)    Start     Dose/Rate Route Frequency Ordered Stop   11/08/23 1200  erythromycin (EES) 400 MG/5ML suspension 400 mg  Status:  Discontinued        400 mg Oral Every 6 hours 11/08/23 0815 11/12/23 1111   11/06/23 0815  piperacillin-tazobactam (ZOSYN) IVPB 2.25 g  Status:  Discontinued        2.25 g 100 mL/hr over 30  Minutes Intravenous Every 8 hours 11/06/23 0729 11/14/23 0951   11/01/23 1415  piperacillin-tazobactam (ZOSYN) IVPB 2.25 g        2.25 g 100 mL/hr over 30 Minutes Intravenous Every 8 hours 11/01/23 1316 11/06/23 0602   10/29/23 1100  piperacillin-tazobactam (ZOSYN) IVPB 2.25 g  Status:  Discontinued        2.25 g 100 mL/hr over 30 Minutes Intravenous Every 8 hours 10/29/23 1006 11/01/23 1016   10/28/23 0600  piperacillin-tazobactam (ZOSYN) IVPB 2.25 g  Status:  Discontinued        2.25 g 100 mL/hr over 30 Minutes Intravenous Every 8 hours 10/27/23 2224 10/27/23 2235   10/28/23 0600  ceFEPIme (MAXIPIME) 1 g in sodium chloride 0.9 % 100 mL IVPB  Status:  Discontinued        1 g 200 mL/hr over 30 Minutes Intravenous Every 24 hours 10/27/23 2235 10/29/23 0954   10/28/23 0600  metroNIDAZOLE (FLAGYL)  IVPB 500 mg  Status:  Discontinued        500 mg 100 mL/hr over 60 Minutes Intravenous Every 12 hours 10/27/23 2235 10/29/23 0954   10/27/23 2130  piperacillin-tazobactam (ZOSYN) IVPB 3.375 g        3.375 g 100 mL/hr over 30 Minutes Intravenous  Once 10/27/23 2128 10/27/23 2320        Assessment/Plan  POD 17, s/p Hartmann's by Dr. Freida Busman 11/02/23 forSigmoid diverticulitis with perforation  - WBC down to 10.2K.  Off abx -hgb 10.1 - VAC in place with no evidence of bleeding. - renal diet and tolerating - achy pain yesterday and improved today. No po intake yet. Abd xray with nonobstructive pattern. Has not been on bowel regimen because she had been having liquid output. Add bowel regimen today and monitor diet tolerance - therapies for mobilization, rec CIR - VAC to midline wound, M/Th sched.  - WOC for colostomy care  -patient is approaching surgical stability.  She would greatly benefit from CIR over SNF placement due to her multiple medical needs, HD, anemia, bleeding from abdominal wound on anticoagulation.  It would be more beneficial to the patient to be in CIR so if she were to develop a bleeding complication from her wound on Eliquis, surgical services are more readily available.  If she were to go to SNF, she would be high risk for readmission due to possible complications as noted above.  -patient is surgically stable for DC. Surgical team will remain available as needed     FEN: renal diet, protein supplements VTE: Eliquis ID: cefepime/flagyl 2/3>2/4; Zosyn 2/4>>2/20   - per TRH -  PAF  HTN HLD AKI on CKD stage V - on HD CAD T2DM   LOS: 23 days     Eric Form, [facility] 11/19/2023, 12:22 PM Please see Amion for pager number during day hours 7:00am-4:30pm

## 2023-11-19 NOTE — Progress Notes (Signed)
 Nutrition Follow-up  DOCUMENTATION CODES:   Obesity unspecified  INTERVENTION:  Hand out provided Continue with current diet. Continue with MVI Ensure Plus High Protein po BID, each supplement provides 350 kcal and 20 grams of protein.     NUTRITION DIAGNOSIS:   Increased nutrient needs related to post-op healing as evidenced by estimated needs.    GOAL:   Patient will meet greater than or equal to 90% of their needs    MONITOR:   Diet advancement  REASON FOR ASSESSMENT:   Consult Diet education  ASSESSMENT:   Pt with hx of CKD 5 following with [facility] for evaluation for renal transplant, atrial fibrillation on anticoagulation, CAD, NSTEMI, PAD, CVA, diabetes type 2, hypertension, hyperlipidemia, anemia, who presented for acute onset left lower quadrant abdominal pain. She has a history of partial right colectomy for polyps reported noncancerous, remote history of partial hysterectomy. Found to have complicated diverticulitis with contained perforation.  Significant events: 2/2>> admit to [facility] 2/6>> CT abdomen worsening diverticulitis with perforation 2/7>> transitioned to full comfort measures-as with worsening AKI-however multiple family members arrived-comfort care revoked-now full code-full scope of treatment including HD/laparotomy with colostomy.  General surgery/nephrology reconsulted.  IR placed [facility] and patient underwent first hemodialysis.  2/8>> remains n.p.o-colectomy/colostomy later today. 2/20>> wound VAC reapplied   Significant studies: 2/2>> CT abdomen/pelvis: Acute diverticulitis-descending colon with contained perforation 2/6>> CT abdomen/pelvis: Worsening diverticulitis of descending colon with perforation and increasing failure-suspected oral contrast extravasation left abdomen.   Significant microbiology data: 2/2>> COVID/influenza/RSV PCR: Negative   Procedures: 2/8>> by Dr. Freida Busman underwent exploratory laparotomy with Hartman's resection for  perforated diverticulitis with diffuse peritonitis, colostomy in place. 2/13 - R IJ Trialysis catheter - IR 2/14 -left IJ Central line placed by IR for TNA(removed on 2/22) 11/12/2023. R.internal jugular tunneled HD catheter placement by IR  Hospital weight history: 11/19/23 0529 90 kg 198.41 lbs  11/18/23 1824 87.2 kg 192.24 lbs  11/18/23 1448 89.8 kg 197.97 lbs  11/15/23 1324 90.4 kg 199.3 lbs  11/15/23 0759 93.4 kg 205.91 lbs  11/13/23 1200 94.1 kg 207.45 lbs  11/13/23 0818 96.1 kg 211.86 lbs  11/11/23 1244 98.5 kg  217.15 lbs  11/11/23 0900 103.6 kg 228.4 lbs  11/11/23 0500 100.2 kg 220.9 lbs  11/10/23 0453 100.4 kg 221.34 lbs  11/09/23 0444 98.2 kg 216.49 lbs  11/08/23 1857 96.5 kg 212.74 lbs  11/08/23 1519 105.9 kg 233.47 lbs  11/08/23 0938 99.4 kg 219.14 lbs  11/06/23 1722 102.9 kg 226.85 lbs  11/04/23 1732 96.8 kg  213.41 lbs  11/04/23 1354 97.6 kg 215.17 lbs  11/04/23 0548 99.3 kg 218.92 lbs  11/02/23 2118 98.3 kg 216.71 lbs  11/02/23 1820 98.7 kg 217.59 lbs  11/02/23 0838 100.5 kg 221.56 lbs  11/01/23 2212 100.5 kg 221.56 lbs  11/01/23 1951 100.9 kg 222.44 lbs  11/01/23 0500 101.4 kg 223.55 lbs  10/31/23 0500 97.1 kg 214.1 lbs  10/27/23 2200 98.3 kg 216.71 lbs      Diet Order:   Diet Order             Diet regular Room service appropriate? Yes; Fluid consistency: Thin  Diet effective now                   EDUCATION NEEDS:   No education needs have been identified at this time  Skin:  Skin Assessment: Reviewed RN Assessment Skin Integrity Issues:: Wound VAC Wound Vac: abdomen  Last BM:  11/19/23  Height:   Ht  Readings from Last 1 Encounters:  11/02/23 5' 7.01" (1.702 m)    Weight:   Wt Readings from Last 1 Encounters:  11/19/23 90 kg    Ideal Body Weight:  61.4 kg  BMI:  Body mass index is 31.07 kg/m.  Estimated Nutritional Needs:   Kcal:  2250-2450  Protein:  120-135 grams  Fluid:  1000 ml + UOP    Jamelle Haring RDN,  LDN Clinical Dietitian   If unable to reach, please contact "RD Inpatient" secure chat group between 8 am-4 pm daily"

## 2023-11-19 NOTE — TOC Progression Note (Signed)
 Transition of Care [facility]) - Progression Note    Patient Details  Name: [name] MRN: [id number] Date of Birth: 06-18-1956  Transition of Care [facility] CM/SW Contact  Jessie Foot, RN Phone Number: 11/19/2023, 4:03 PM  Clinical Narrative:    Patient given medicare choice list. Patient states CM can start the auth process and will have son to go see the facility.  Patient currently has [employer] for transportation. Requested [facility] began insurance process and order wound vac.      Expected Discharge Plan: Skilled Nursing Facility Barriers to Discharge: Continued Medical Work up, English as a second language teacher  Expected Discharge Plan and Services In-house Referral: Clinical Social Work   Post Acute Care Choice: IP Rehab Living arrangements for the past 2 months: Single Family Home                                       Social Determinants of Health (SDOH) Interventions SDOH Screenings   Food Insecurity: No Food Insecurity (10/28/2023)  Housing: Low Risk  (10/28/2023)  Transportation Needs: No Transportation Needs (10/28/2023)  Utilities: Not At Risk (10/28/2023)  Depression (PHQ2-9): Low Risk  (02/28/2021)  Financial Resource Strain: Low Risk  (08/25/2021)   Received from [facility], [facility]  Physical Activity: Insufficiently Active (06/20/2021)   Received from [facility], [facility]  Social Connections: Socially Isolated (10/28/2023)  Stress: No Stress Concern Present (08/25/2021)   Received from [facility], [facility]  Tobacco Use: Low Risk  (11/02/2023)    Readmission Risk Interventions     No data to display

## 2023-11-19 NOTE — TOC Progression Note (Signed)
 Transition of Care [facility]) - Progression Note    Patient Details  Name: [name] MRN: [id number] Date of Birth: Sep 18, 1956  Transition of Care [facility]) CM/SW Contact  Jessie Foot, RN Phone Number: 11/19/2023, 10:54 AM  Clinical Narrative:    Discussed options for SNF since CIR appeal was denied.  Will send out refer for review to see who can accommodate with dialysis transport.   Expected Discharge Plan: Skilled Nursing Facility Barriers to Discharge: Continued Medical Work up, English as a second language teacher  Expected Discharge Plan and Services In-house Referral: Clinical Social Work   Post Acute Care Choice: IP Rehab Living arrangements for the past 2 months: Single Family Home                                       Social Determinants of Health (SDOH) Interventions SDOH Screenings   Food Insecurity: No Food Insecurity (10/28/2023)  Housing: Low Risk  (10/28/2023)  Transportation Needs: No Transportation Needs (10/28/2023)  Utilities: Not At Risk (10/28/2023)  Depression (PHQ2-9): Low Risk  (02/28/2021)  Financial Resource Strain: Low Risk  (08/25/2021)   Received from [facility], [facility]  Physical Activity: Insufficiently Active (06/20/2021)   Received from [facility], [facility]  Social Connections: Socially Isolated (10/28/2023)  Stress: No Stress Concern Present (08/25/2021)   Received from [facility], [facility]  Tobacco Use: Low Risk  (11/02/2023)    Readmission Risk Interventions     No data to display

## 2023-11-19 NOTE — Plan of Care (Signed)
  Problem: Education: Goal: Ability to describe self-care measures that may prevent or decrease complications (Diabetes Survival Skills Education) will improve Outcome: Progressing Goal: Individualized Educational Video(s) Outcome: Progressing   Problem: Coping: Goal: Ability to adjust to condition or change in health will improve Outcome: Progressing   Problem: Fluid Volume: Goal: Ability to maintain a balanced intake and output will improve Outcome: Progressing   Problem: Health Behavior/Discharge Planning: Goal: Ability to identify and utilize available resources and services will improve Outcome: Progressing Goal: Ability to manage health-related needs will improve Outcome: Progressing   Problem: Metabolic: Goal: Ability to maintain appropriate glucose levels will improve Outcome: Progressing   Problem: Nutritional: Goal: Maintenance of adequate nutrition will improve Outcome: Progressing Goal: Progress toward achieving an optimal weight will improve Outcome: Progressing   Problem: Skin Integrity: Goal: Risk for impaired skin integrity will decrease Outcome: Progressing   Problem: Tissue Perfusion: Goal: Adequacy of tissue perfusion will improve Outcome: Progressing   Problem: Education: Goal: Knowledge of General Education information will improve Description: Including pain rating scale, medication(s)/side effects and non-pharmacologic comfort measures Outcome: Progressing   Problem: Health Behavior/Discharge Planning: Goal: Ability to manage health-related needs will improve Outcome: Progressing   Problem: Clinical Measurements: Goal: Ability to maintain clinical measurements within normal limits will improve Outcome: Progressing Goal: Will remain free from infection Outcome: Progressing Goal: Diagnostic test results will improve Outcome: Progressing Goal: Respiratory complications will improve Outcome: Progressing Goal: Cardiovascular complication will  be avoided Outcome: Progressing   Problem: Activity: Goal: Risk for activity intolerance will decrease Outcome: Progressing   Problem: Nutrition: Goal: Adequate nutrition will be maintained Outcome: Progressing   Problem: Coping: Goal: Level of anxiety will decrease Outcome: Progressing   Problem: Elimination: Goal: Will not experience complications related to bowel motility Outcome: Progressing Goal: Will not experience complications related to urinary retention Outcome: Progressing   Problem: Pain Managment: Goal: General experience of comfort will improve and/or be controlled Outcome: Progressing   Problem: Safety: Goal: Ability to remain free from injury will improve Outcome: Progressing   Problem: Skin Integrity: Goal: Risk for impaired skin integrity will decrease Outcome: Progressing   Problem: Education: Goal: Knowledge of the prescribed therapeutic regimen will improve Outcome: Progressing   Problem: Coping: Goal: Ability to identify and develop effective coping behavior will improve Outcome: Progressing   Problem: Clinical Measurements: Goal: Quality of life will improve Outcome: Progressing   Problem: Respiratory: Goal: Verbalizations of increased ease of respirations will increase Outcome: Progressing   Problem: Role Relationship: Goal: Family's ability to cope with current situation will improve Outcome: Progressing Goal: Ability to verbalize concerns, feelings, and thoughts to partner or family member will improve Outcome: Progressing   Problem: Pain Management: Goal: Satisfaction with pain management regimen will improve Outcome: Progressing   Problem: Education: Goal: Knowledge of disease and its progression will improve Outcome: Progressing Goal: Individualized Educational Video(s) Outcome: Progressing   Problem: Fluid Volume: Goal: Compliance with measures to maintain balanced fluid volume will improve Outcome: Progressing    Problem: Health Behavior/Discharge Planning: Goal: Ability to manage health-related needs will improve Outcome: Progressing   Problem: Nutritional: Goal: Ability to make healthy dietary choices will improve Outcome: Progressing   Problem: Clinical Measurements: Goal: Complications related to the disease process, condition or treatment will be avoided or minimized Outcome: Progressing

## 2023-11-19 NOTE — Progress Notes (Signed)
 Patient ID: [name], female   DOB: Feb 07, 1956, 68 y.o.   MRN: [id number] [facility] Progress Note   Assessment/ Plan:   1.  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  After initial refusal of wanting to pursue renal replacement therapy, she changed her mind and asked to start dialysis with the intention of undergoing surgery for contained perforation of acute diverticulitis.  -TDC w/ IR on 2/18 -Maintain dialysis on MWF schedule -Palliative care has been involved and patient wishes to continue with dialysis for now.   -CLIP in process; likely [facility], renal navigator following.  Probably going to CIR--pending -Would hold on permanent access creation for now given overall clinical picture, will likely need to revisit as an outpatient 2.  Acute diverticulitis with contained perforation of descending colon: On Zosyn and now status post exploratory laparotomy with Hartman's resection for perforated diverticulitis/peritonitis (2/8).  Management per surgery and primary team 3.  Anion gap metabolic acidosis: Resolved with renal replacement therapy 4.  Anemia: Has received blood and IV iron. ESA also ordered 5. HTN: BP acceptable/stable, uf as tolerated 6. Dispo: eval for CIR.  Subjective:   Patient seen and examined bedside. Had a rough time with HD yesterday, felt like her body hurt all over. Treatment ended 15 min earlier.   Objective:   BP (!) 121/55 (BP Location: Right Arm)   Pulse 71   Temp 98.3 F (36.8 C) (Oral)   Resp 12   Ht 5' 7.01" (1.702 m)   Wt 90 kg   SpO2 96%   BMI 31.07 kg/m   Intake/Output Summary (Last 24 hours) at 11/19/2023 1317 Last data filed at 11/18/2023 1818 Gross per 24 hour  Intake --  Output 2940 ml  Net -2940 ml   Weight change:   Physical Exam: Gen: Lying in bed in no distress CVS:  Normal rate, no rub Resp: Bilateral chest rise with no increased work of breathing, cta bl Abd: Minimal distention, colostomy bag in place Ext: no sig edema b/l LEs Neuro: awake, alert Dialysis access: right [facility] c/d/i  Imaging: DG Abd Portable 1V Result Date: 11/19/2023 CLINICAL DATA:  Abdominal pain.  Colostomy back in place. EXAM: PORTABLE ABDOMEN - 1 VIEW COMPARISON:  CT dated 11/06/2023. FINDINGS: No bowel dilatation or evidence of obstruction. There is paucity of bowel air. Anastomotic staple line noted in the right lower quadrant. No free air. The osseous structures are intact. The soft tissues are unremarkable. IMPRESSION: Nonobstructive bowel gas pattern. Electronically Signed   By: Elgie Collard M.D.   On: 11/19/2023 09:59       Labs: BMET Recent Labs  Lab 11/13/23 0052 11/14/23 0354 11/15/23 0806 11/18/23 0551 11/19/23 0834  NA 136 135 136 135 134*  K 4.0 3.7 3.5 3.5 3.5  CL 100 97* 99 100 97*  CO2 24 26 24 22 26   GLUCOSE 128* 152* 96 118* 88  BUN 39* 25* 30* 30* 17  CREATININE 7.04* 5.39* 7.20* 8.20* 6.15*  CALCIUM 7.6* 7.7* 7.8* 7.9* 8.2*  PHOS  --   --  4.9* 4.8* 4.3   CBC Recent Labs  Lab 11/14/23 0354 11/15/23 0806 11/18/23 0551 11/19/23 0834  WBC 15.8* 13.5* 10.7* 10.2  HGB 8.2* 8.6* 9.1* 10.1*  HCT 26.0* 26.7* 28.8* 31.1*  MCV 84.1 84.2 85.0 84.1  PLT 306 325 338 227    Medications:  amLODipine  5 mg Oral Daily   apixaban  5 mg Oral BID   carvedilol  6.25 mg Oral BID WC   Chlorhexidine Gluconate Cloth  6 each Topical Q0600   darbepoetin (ARANESP) injection - DIALYSIS  60 mcg Subcutaneous Q Mon-1800   docusate sodium  100 mg Oral BID   insulin aspart  0-15 Units Subcutaneous TID WC   insulin aspart  0-5 Units Subcutaneous QHS   insulin glargine  12 Units Subcutaneous Q24H   mouth rinse  15 mL Mouth Rinse 4 times per day   pantoprazole  40 mg Oral BID   polyethylene glycol  17 g Oral Daily   senna  1 tablet Oral Daily   sodium  chloride flush  10-40 mL Intracatheter Q12H   [name]  11/19/2023, 1:17 PM

## 2023-11-19 NOTE — Progress Notes (Signed)
 Occupational Therapy Treatment Patient Details Name: [name] MRN: [id number] DOB: 04-29-1956 Today's Date: 11/19/2023   History of present illness Patient is a 68 y.o.  female - who presented with abdominal pain-found to have acute diverticulitis with contained perforation involving the descending colon. 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  Past medical history of CKD 5, A-fib on Eliquis, CAD   OT comments  Pt is making good progress towards acute OT goals. Pt continues to be limited by decreased activity tolerance and pain. Pt stated "I feel like a new woman", upon arrival. Pt reported having increased pain earlier in the day but received pain medication prior to session. Overall, pt completed functional mobility with up to MIN A. Verbal cues for B hand placement during STS and sequencing during step pivot tranfers towards BSC were required. Pt completed peri care while standing supported unilaterally at RW with CGA. No LOB. Pt reported increased fatigue after completing peri care and requested a seated rest break. Pt completed functional ambulation in hallway using RW with CGA. Verbal cues were provided for self-placement inside RW to improve upright posture and to increase pt safety. Pt verbalized understanding. Pt required 1 seated rest break during functional mobility to sustain activity tolerance. OT to continue following pt acutely to address functional needs. Discharge recommendations remains appropriate at this time to maximize pt functional independence and to ensure safe discharge to home environment.       If plan is discharge home, recommend the following:  A little help with walking and/or transfers;Assistance with cooking/housework;Help with stairs or ramp for entrance;Assist for transportation;Direct supervision/assist for medications management;Direct supervision/assist for financial management;A lot of help with bathing/dressing/bathroom   Equipment  Recommendations       Recommendations for Other Services Rehab consult    Precautions / Restrictions Precautions Precautions: Fall Recall of Precautions/Restrictions: Intact Precaution/Restrictions Comments: Wound vac Restrictions Weight Bearing Restrictions Per Provider Order: No       Mobility Bed Mobility Overal bed mobility: Needs Assistance Bed Mobility: Supine to Sit     Supine to sit: Contact guard, HOB elevated, Used rails     General bed mobility comments: Pt completed bed mobility without physical assistance. CGA provided for safety. Pt provided with verbal cues for sequencing and hand placement when performing step pivot towards BSC.    Transfers Overall transfer level: Needs assistance Equipment used: Rolling walker (2 wheels) Transfers: Sit to/from Stand, Bed to chair/wheelchair/BSC Sit to Stand: Contact guard assist     Step pivot transfers: Min assist     General transfer comment: verbal cues provided for B hand placement when performing STS transfers. MIN A to pivot to [facility] for line management and verbal cues provided for sequencing and hand placement     Balance Overall balance assessment: Needs assistance Sitting-balance support: Feet supported, No upper extremity supported Sitting balance-Leahy Scale: Good Sitting balance - Comments: static sitting EOB   Standing balance support: Single extremity supported, During functional activity Standing balance-Leahy Scale: Fair Standing balance comment: completed peri care while standing supported unilaterally at RW                           ADL either performed or assessed with clinical judgement   ADL Overall ADL's : Needs assistance/impaired                         Toilet Transfer: Contact guard assist;BSC/3in1;Stand-pivot  Toileting- Clothing Manipulation and Hygiene: Contact guard assist;Sit to/from stand Toileting - Clothing Manipulation Details (indicate cue type and  reason): standing unilateral at RW     Functional mobility during ADLs: Rolling walker (2 wheels);Cueing for sequencing;Cueing for safety;Minimal assistance General ADL Comments: Pt completed bed mobility with HOB elevated, use of bed rail, and CGA for safety.  Pt performed step pivot transfer from EOB to [facility] without use of AD with MIN A. Pt able to safely complete peri care while standing supported unilaterally at RW with CGA.    Extremity/Trunk Assessment Upper Extremity Assessment Upper Extremity Assessment: Overall WFL for tasks assessed   Lower Extremity Assessment Lower Extremity Assessment: Defer to PT evaluation        Vision   Vision Assessment?: No apparent visual deficits   Perception Perception Perception: Within Functional Limits   Praxis Praxis Praxis: Not tested   Communication Communication Communication: No apparent difficulties   Cognition Arousal: Alert Behavior During Therapy: WFL for tasks assessed/performed Cognition: No apparent impairments             OT - Cognition Comments: Pt answered all questions appropriately. Able to follow multi-step commands and expressed needs                 Following commands: Intact        Cueing   Cueing Techniques: Verbal cues  Exercises      Shoulder Instructions       General Comments VSS on RA    Pertinent Vitals/ Pain       Pain Assessment Pain Assessment: No/denies pain  Home Living                                          Prior Functioning/Environment              Frequency  Min 1X/week        Progress Toward Goals  OT Goals(current goals can now be found in the care plan section)  Progress towards OT goals: Progressing toward goals  Acute Rehab OT Goals Patient Stated Goal: to get out of bed OT Goal Formulation: With patient Time For Goal Achievement: 11/30/23 Potential to Achieve Goals: Good ADL Goals Pt Will Perform Upper Body Dressing: with  contact guard assist;sitting Pt Will Perform Lower Body Dressing: with min assist;with adaptive equipment;sitting/lateral leans;sit to/from stand Pt Will Transfer to Toilet: with contact guard assist;ambulating;regular height toilet Additional ADL Goal #1: Pt will tolerate OOB activity x10 min in prep for ADLs  Plan      Co-evaluation                 AM-PAC OT "6 Clicks" Daily Activity     Outcome Measure   Help from another person eating meals?: None Help from another person taking care of personal grooming?: A Little Help from another person toileting, which includes using toliet, bedpan, or urinal?: A Little Help from another person bathing (including washing, rinsing, drying)?: A Lot Help from another person to put on and taking off regular upper body clothing?: A Little Help from another person to put on and taking off regular lower body clothing?: A Lot 6 Click Score: 17    End of Session Equipment Utilized During Treatment: Gait belt;Rolling walker (2 wheels)  OT Visit Diagnosis: Unsteadiness on feet (R26.81);Other abnormalities of gait and mobility (R26.89);Muscle weakness (generalized) (M62.81)  Activity Tolerance Patient limited by fatigue   Patient Left in chair;with call bell/phone within reach   Nurse Communication Mobility status        Time: 1610-9604 OT Time Calculation (min): 25 min  Charges: OT General Charges $OT Visit: 1 Visit OT Treatments $Self Care/Home Management : 8-22 mins $Therapeutic Activity: 8-22 mins  Kevan Ny, MOTS  Kaliah Haddaway 11/19/2023, 2:50 PM

## 2023-11-19 NOTE — Progress Notes (Addendum)
 Inpatient Rehab Admissions Coordinator:   Left message to check status of appeal.    [id number]: Received notification that expedited appeal has been denied.  Of note: CMS guidelines state that "inpatient rehabilitation coverage is conclusively established if the patient meets the requirements set forth in the regulation. The regulation does not impose a "less intensive setting" standard. In denying a prior auth request on this basis, an MA plan unlawfully applies coverage criteria that goes beyond the coverage policies applied to traditional Medicare beneficiaries." Despite this, I will not be able to admit this patient to CIR without approval from her [facility].  I will notify medical team, TOC, and therapy.    Estill Dooms, PT, DPT Admissions Coordinator [phone] 11/19/23  8:05 AM

## 2023-11-19 NOTE — Progress Notes (Signed)
 PROGRESS NOTE        PATIENT DETAILS Name: [name] Age: 68 y.o. Sex: female Date of Birth: 09-10-1956 Admit Date: 10/27/2023 Admitting Physician Dolly Rias, MD ZOX:[id number], Devonne Doughty, NP  Brief Summary: Patient is a 68 y.o.  female with history of CKD 5, A-fib on Eliquis, CAD-who presented with abdominal pain-found to have acute diverticulitis with contained perforation involving the descending colon.  Hospital course complicated by worsening AKI-thought to have progressed to ESRD-requiring initiation of HD.  See below for further details.  Significant events: 2/2>> admit to [facility] 2/6>> CT abdomen worsening diverticulitis with perforation 2/7>> transitioned to full comfort measures-as with worsening AKI-however multiple family members arrived-comfort care revoked-now full code-full scope of treatment including HD/laparotomy with colostomy.  General surgery/nephrology reconsulted.  IR placed [facility] and patient underwent first hemodialysis.  2/8>> remains n.p.o-colectomy/colostomy later today. 2/20>> wound VAC reapplied  Significant studies: 2/2>> CT abdomen/pelvis: Acute diverticulitis-descending colon with contained perforation 2/6>> CT abdomen/pelvis: Worsening diverticulitis of descending colon with perforation and increasing failure-suspected oral contrast extravasation left abdomen.  Significant microbiology data: 2/2>> COVID/influenza/RSV PCR: Negative  Procedures: 2/8>> by Dr. Freida Busman underwent exploratory laparotomy with Hartman's resection for perforated diverticulitis with diffuse peritonitis, colostomy in place. 2/13 - R IJ Trialysis catheter - IR 2/14 -left IJ Central line placed by IR for TNA(removed on 2/22) 11/12/2023. R.internal jugular tunneled HD catheter placement by IR  Consults: Surgery Renal Palliative IR  Subjective:   Patient complains of abdominal pain, but no nausea, no vomiting, reports started yesterday after dialysis, but  improved this morning   Objective: Vitals: Blood pressure (!) 121/55, pulse 71, temperature 98.3 F (36.8 C), temperature source Oral, resp. rate 12, height 5' 7.01" (1.702 m), weight 90 kg, SpO2 96%.   Exam:  Awake Alert, Oriented X 3, No new F.N deficits, Normal affect Symmetrical Chest wall movement, Good air movement bilaterally, CTAB RRR,No Gallops,Rubs or new Murmurs, No Parasternal Heave midline surgical wound wound VAC applied, left colostomy with no output No Cyanosis, Clubbing or edema, No new Rash or bruise     Right upper chest tunneled HD catheter,     Assessment/Plan:  Acute diverticulitis with contained perforation of the descending colon  -Management Per general surgery, status post Hartman's procedure by Dr. Freida Busman 11/02/2023. -Currently off TPN, appetite has improved, tolerating p.o. intake, DC IV erythromycin . -Wound care per general surgery, wound VAC t reapplied 2/20 -Wound care following for colostomy care  -IV Zosyn discontinued 2/20, nontoxic-appearing, leukocytosis trending down.   -Repeat x-ray this morning given abdominal pain, General surgery to evaluate today  AKI on CKD 5 with progression to ESRD AKI likely hemodynamically mediated-has now likely progressed to ESRD.  HD initiated via right IJ temporary catheter, switched to right IJ trialysis catheter by IR on 11/07/2023>> tunneled HD catheter right IJ on 11/12/2023 by IR. -CLIP in process, renal would like to hold on permanent access creation for now given her overall clinical picture.   CAD Currently no anginal symptoms Suspect not on antiplatelets as on anticoagulation with Eliquis/heparin Continue beta-blocker On Repatha injections as an outpatient.  PAF Sinus rhythm Continue Coreg Currently on Eliquis  Anemia of acute illness  Anemia of end-stage renal disease Brought on by acute illness, underlying renal insufficiency now developing to ESRD, surgical blood loss, gradually trending down,  2 units of packed RBC on 11/04/2023,  on IV PPI.  -Received 1 unit PRBC 2/19, continue to monitor and transfuse as needed -IV iron and Procrit per renal -Hemoglobin stable, transfuse as needed, monitor closely as she is on Eliquis.  HTN Blood pressure better after starting Norvasc, continue with home dose Coreg  Continue with as needed hydralazine -Should continue to improve with further hemodialysis  DM-2 (A1c 6.6 on 2/3) OffTNA now tolerating oral diet, Semglee and sliding scale adjusted.   Recent Labs    11/18/23 2103 11/19/23 0749 11/19/23 1104  GLUCAP 157* 85 101*       Palliative care/goals of care Palliative input greatly appreciated Class 1 Obesity: Estimated body mass index is 31.07 kg/m as calculated from the following:   Height as of this encounter: 5' 7.01" (1.702 m).   Weight as of this encounter: 90 kg.    Code status:   Code Status: Limited: Do not attempt resuscitation (DNR) -DNR-LIMITED -Do Not Intubate/DNI    DVT Prophylaxis: Place and maintain sequential compression device Start: 11/06/23 0613 apixaban (ELIQUIS) tablet 5 mg IV Heparin  Family Communication: Discussed with patient, all her questions answered.  Disposition Plan: Status is: Inpatient Will benefit from CIR, given multiple issues including her wound, will need frequent monitoring and observation by general surgery team, hemodialysis patient, she is on Eliquis she might need transfusions as well.  With her significant abdominal wound and wound VAC, significant deconditioning when she was independent at baseline, she is With prolonged hospital stay, surgery and significant weakness, will benefit from CIR stay to go back to baseline     Diet: Diet Order             Diet regular Room service appropriate? Yes; Fluid consistency: Thin  Diet effective now                   MEDICATIONS: Scheduled Meds:  amLODipine  5 mg Oral Daily   apixaban  5 mg Oral BID   carvedilol  6.25 mg Oral  BID WC   Chlorhexidine Gluconate Cloth  6 each Topical Q0600   darbepoetin (ARANESP) injection - DIALYSIS  60 mcg Subcutaneous Q Mon-1800   docusate sodium  100 mg Oral BID   insulin aspart  0-15 Units Subcutaneous TID WC   insulin aspart  0-5 Units Subcutaneous QHS   insulin glargine-yfgn  12 Units Subcutaneous Daily   mouth rinse  15 mL Mouth Rinse 4 times per day   pantoprazole  40 mg Oral BID   polyethylene glycol  17 g Oral Daily   senna  1 tablet Oral Daily   sodium chloride flush  10-40 mL Intracatheter Q12H   Continuous Infusions:   PRN Meds:.acetaminophen **OR** acetaminophen, albuterol, hydrALAZINE, HYDROmorphone (DILAUDID) injection, melatonin, menthol-cetylpyridinium, ondansetron (ZOFRAN) IV, mouth rinse, oxyCODONE   I have personally reviewed following labs and imaging studies  LABORATORY DATA:   Data Review:    Recent Labs  Lab 11/13/23 0052 11/14/23 0354 11/15/23 0806 11/18/23 0551 11/19/23 0834  WBC 18.8* 15.8* 13.5* 10.7* 10.2  HGB 7.6* 8.2* 8.6* 9.1* 10.1*  HCT 23.9* 26.0* 26.7* 28.8* 31.1*  PLT 326 306 325 338 227  MCV 83.9 84.1 84.2 85.0 84.1  MCH 26.7 26.5 27.1 26.8 27.3  MCHC 31.8 31.5 32.2 31.6 32.5  RDW 16.8* 16.5* 16.3* 16.9* 16.8*    Recent Labs  Lab 11/13/23 0052 11/14/23 0354 11/15/23 0806 11/18/23 0551 11/19/23 0834  NA 136 135 136 135 134*  K 4.0 3.7 3.5 3.5 3.5  CL 100 97* 99 100 97*  CO2 24 26 24 22 26   ANIONGAP 12 12 13 13 11   GLUCOSE 128* 152* 96 118* 88  BUN 39* 25* 30* 30* 17  CREATININE 7.04* 5.39* 7.20* 8.20* 6.15*  ALBUMIN  --   --  1.9* 2.1* 2.2*  PHOS  --   --  4.9* 4.8* 4.3  CALCIUM 7.6* 7.7* 7.8* 7.9* 8.2*      Recent Labs  Lab 11/13/23 0052 11/14/23 0354 11/15/23 0806 11/18/23 0551 11/19/23 0834  CALCIUM 7.6* 7.7* 7.8* 7.9* 8.2*    --------------------------------------------------------------------------------------------------------------- Lab Results  Component Value Date   CHOL 134 12/21/2019    HDL 49 12/21/2019   LDLCALC 69 12/21/2019   TRIG 148 11/11/2023   CHOLHDL 2.7 12/21/2019    Lab Results  Component Value Date   HGBA1C 6.6 (H) 10/28/2023      Micro Results Recent Results (from the past 240 hours)  MRSA Next Gen by PCR, Nasal     Status: None   Collection Time: 11/10/23  5:14 AM   Specimen: Nasal Mucosa; Nasal Swab  Result Value Ref Range Status   MRSA by PCR Next Gen NOT DETECTED NOT DETECTED Final    Comment: (NOTE) The GeneXpert MRSA Assay (FDA approved for NASAL specimens only), is one component of a comprehensive MRSA colonization surveillance program. It is not intended to diagnose MRSA infection nor to guide or monitor treatment for MRSA infections. Test performance is not FDA approved in patients less than 63 years old. Performed at [facility], [address], [place], [place] [address]      Radiology Reports DG Abd Portable 1V Result Date: 11/19/2023 CLINICAL DATA:  Abdominal pain.  Colostomy back in place. EXAM: PORTABLE ABDOMEN - 1 VIEW COMPARISON:  CT dated 11/06/2023. FINDINGS: No bowel dilatation or evidence of obstruction. There is paucity of bowel air. Anastomotic staple line noted in the right lower quadrant. No free air. The osseous structures are intact. The soft tissues are unremarkable. IMPRESSION: Nonobstructive bowel gas pattern. Electronically Signed   By: Elgie Collard M.D.   On: 11/19/2023 09:59   IR Fluoro Guide CV Line Right Result Date: 11/12/2023 CLINICAL DATA:  End-stage renal disease and status post placement of non tunneled temporary dialysis catheter on 11/01/2023. The patient now requires placement of a tunneled dialysis catheter for further longer-term hemodialysis. EXAM: TUNNELED CENTRAL VENOUS HEMODIALYSIS CATHETER PLACEMENT WITH ULTRASOUND AND FLUOROSCOPIC GUIDANCE ANESTHESIA/SEDATION: Moderate (conscious) sedation was employed during this procedure. A total of Versed 1.0 mg and Fentanyl 50 mcg was administered  intravenously. Moderate Sedation Time: 16 minutes. The patient's level of consciousness and vital signs were monitored continuously by radiology nursing throughout the procedure under my direct supervision. MEDICATIONS: The patient is receiving IV Rocephin every 8 hours in the hospital and did not receive additional antibiotics for the procedure. FLUOROSCOPY: 24 seconds.  2.3 mGy. PROCEDURE: The procedure, risks, benefits, and alternatives were explained to the patient. Questions regarding the procedure were encouraged and answered. The patient understands and consents to the procedure. A timeout was performed prior to initiating the procedure. A non tunneled temporary right-sided dialysis catheter was removed prior to the procedure and pressure held at the exit site. The right neck and chest were prepped with chlorhexidine in a sterile fashion, and a sterile drape was applied covering the operative field. Maximum barrier sterile technique with sterile gowns and gloves were used for the procedure. Local anesthesia was provided with 1% lidocaine. Ultrasound was  used to confirm patency of the right internal jugular vein. An ultrasound image was saved and recorded. After creating a small venotomy incision, a 21 gauge needle was advanced into the right internal jugular vein under direct, real-time ultrasound guidance. Ultrasound image documentation was performed. After securing guidewire access, an 8 Fr dilator was placed. A J-wire was kinked to measure appropriate catheter length. A Palindrome tunneled hemodialysis catheter measuring 19 cm from tip to cuff was chosen for placement. This was tunneled in a retrograde fashion from the chest wall to the venotomy incision. At the venotomy, serial dilatation was performed and a 15 Fr peel-away sheath was placed over a guidewire. The catheter was then placed through the sheath and the sheath removed. Final catheter positioning was confirmed and documented with a fluoroscopic  spot image. The catheter was aspirated, flushed with saline, and injected with appropriate volume heparin dwells. The venotomy incision was closed with subcuticular 4-0 Vicryl. Dermabond was applied to the incision. The catheter exit site was secured with 0-Prolene retention sutures. COMPLICATIONS: None.  No pneumothorax. FINDINGS: After catheter placement, the tip lies in the right atrium. The catheter aspirates normally and is ready for immediate use. IMPRESSION: Placement of tunneled hemodialysis catheter via new puncture of the right internal jugular vein after removal of the non tunneled catheter. The new catheter tip lies in the right atrium. The catheter is ready for immediate use. Electronically Signed   By: Irish Lack M.D.   On: 11/12/2023 10:45   IR US Guide Vasc Access Right Result Date: 11/12/2023 CLINICAL DATA:  End-stage renal disease and status post placement of non tunneled temporary dialysis catheter on 11/01/2023. The patient now requires placement of a tunneled dialysis catheter for further longer-term hemodialysis. EXAM: TUNNELED CENTRAL VENOUS HEMODIALYSIS CATHETER PLACEMENT WITH ULTRASOUND AND FLUOROSCOPIC GUIDANCE ANESTHESIA/SEDATION: Moderate (conscious) sedation was employed during this procedure. A total of Versed 1.0 mg and Fentanyl 50 mcg was administered intravenously. Moderate Sedation Time: 16 minutes. The patient's level of consciousness and vital signs were monitored continuously by radiology nursing throughout the procedure under my direct supervision. MEDICATIONS: The patient is receiving IV Rocephin every 8 hours in the hospital and did not receive additional antibiotics for the procedure. FLUOROSCOPY: 24 seconds.  2.3 mGy. PROCEDURE: The procedure, risks, benefits, and alternatives were explained to the patient. Questions regarding the procedure were encouraged and answered. The patient understands and consents to the procedure. A timeout was performed prior to initiating  the procedure. A non tunneled temporary right-sided dialysis catheter was removed prior to the procedure and pressure held at the exit site. The right neck and chest were prepped with chlorhexidine in a sterile fashion, and a sterile drape was applied covering the operative field. Maximum barrier sterile technique with sterile gowns and gloves were used for the procedure. Local anesthesia was provided with 1% lidocaine. Ultrasound was used to confirm patency of the right internal jugular vein. An ultrasound image was saved and recorded. After creating a small venotomy incision, a 21 gauge needle was advanced into the right internal jugular vein under direct, real-time ultrasound guidance. Ultrasound image documentation was performed. After securing guidewire access, an 8 Fr dilator was placed. A J-wire was kinked to measure appropriate catheter length. A Palindrome tunneled hemodialysis catheter measuring 19 cm from tip to cuff was chosen for placement. This was tunneled in a retrograde fashion from the chest wall to the venotomy incision. At the venotomy, serial dilatation was performed and a 15 Fr peel-away sheath was placed  over a guidewire. The catheter was then placed through the sheath and the sheath removed. Final catheter positioning was confirmed and documented with a fluoroscopic spot image. The catheter was aspirated, flushed with saline, and injected with appropriate volume heparin dwells. The venotomy incision was closed with subcuticular 4-0 Vicryl. Dermabond was applied to the incision. The catheter exit site was secured with 0-Prolene retention sutures. COMPLICATIONS: None.  No pneumothorax. FINDINGS: After catheter placement, the tip lies in the right atrium. The catheter aspirates normally and is ready for immediate use. IMPRESSION: Placement of tunneled hemodialysis catheter via new puncture of the right internal jugular vein after removal of the non tunneled catheter. The new catheter tip lies in  the right atrium. The catheter is ready for immediate use. Electronically Signed   By: Irish Lack M.D.   On: 11/12/2023 10:45   IR US Guide Vasc Access Left Result Date: 11/08/2023 INDICATION: ESRD.  IV antibiotics. EXAM: ULTRASOUND AND FLUOROSCOPIC GUIDED PLACEMENT OF TUNNELED CENTRAL VENOUS "Powerline" CATHETER MEDICATIONS: The patient was on scheduled IV antibiotics. ANESTHESIA/SEDATION: Local anesthetic was administered. The patient was continuously monitored during the procedure by the interventional radiology nurse under my direct supervision. FLUOROSCOPY TIME:  Fluoroscopic dose; 3.8 mGy COMPLICATIONS: None immediate. PROCEDURE: Informed written consent was obtained from the patient and/or patient's representative after a discussion of the risks, benefits, and alternatives to treatment. Questions regarding the procedure were encouraged and answered. The LEFT neck and chest were prepped with chlorhexidine in a sterile fashion, and a sterile drape was applied covering the operative field. Maximum barrier sterile technique with sterile gowns and gloves were used for the procedure. A timeout was performed prior to the initiation of the procedure. After the overlying soft tissues were anesthetized, a small venotomy incision was created and a micropuncture kit was utilized to access the internal jugular vein. Real-time ultrasound guidance was utilized for vascular access including the acquisition of a permanent ultrasound image documenting patency of the accessed vessel. The microwire was utilized to measure appropriate catheter length. The micropuncture sheath was exchanged for a peel-away sheath over a guidewire. A 5 Fr dual lumen tunneled central venous catheter measuring 33 cm was tunneled in a retrograde fashion from the anterior chest wall to the venotomy incision. The catheter was then placed through the peel-away sheath with tip ultimately positioned at the superior caval-atrial junction. Final  catheter positioning was confirmed and documented with a spot radiographic image. The catheter aspirates and flushes normally. The catheter was flushed with appropriate volume heparin dwells. The catheter exit site was secured with a 3-0 Ethilon retention suture. The venotomy incision was closed with Dermabond. Dressings were applied. The patient tolerated the procedure well without immediate post procedural complication. FINDINGS: After catheter placement, the tip lies within the superior apect of the right atrium. The catheter aspirates and flushes normally and is ready for immediate use. IMPRESSION: Successful placement of 33 cm dual lumen tunneled "Powerline" central venous catheter via the LEFT internal jugular vein The tip of the catheter is positioned within the proximal RIGHT atrium. The catheter is ready for immediate use. Roanna Banning, MD [facility] Radiology Electronically Signed   By: Roanna Banning M.D.   On: 11/08/2023 18:10   IR Fluoro Guide CV Line Left Result Date: 11/08/2023 INDICATION: ESRD.  IV antibiotics. EXAM: ULTRASOUND AND FLUOROSCOPIC GUIDED PLACEMENT OF TUNNELED CENTRAL VENOUS "Powerline" CATHETER MEDICATIONS: The patient was on scheduled IV antibiotics. ANESTHESIA/SEDATION: Local anesthetic was administered. The patient was continuously  monitored during the procedure by the interventional radiology nurse under my direct supervision. FLUOROSCOPY TIME:  Fluoroscopic dose; 3.8 mGy COMPLICATIONS: None immediate. PROCEDURE: Informed written consent was obtained from the patient and/or patient's representative after a discussion of the risks, benefits, and alternatives to treatment. Questions regarding the procedure were encouraged and answered. The LEFT neck and chest were prepped with chlorhexidine in a sterile fashion, and a sterile drape was applied covering the operative field. Maximum barrier sterile technique with sterile gowns and gloves  were used for the procedure. A timeout was performed prior to the initiation of the procedure. After the overlying soft tissues were anesthetized, a small venotomy incision was created and a micropuncture kit was utilized to access the internal jugular vein. Real-time ultrasound guidance was utilized for vascular access including the acquisition of a permanent ultrasound image documenting patency of the accessed vessel. The microwire was utilized to measure appropriate catheter length. The micropuncture sheath was exchanged for a peel-away sheath over a guidewire. A 5 Fr dual lumen tunneled central venous catheter measuring 33 cm was tunneled in a retrograde fashion from the anterior chest wall to the venotomy incision. The catheter was then placed through the peel-away sheath with tip ultimately positioned at the superior caval-atrial junction. Final catheter positioning was confirmed and documented with a spot radiographic image. The catheter aspirates and flushes normally. The catheter was flushed with appropriate volume heparin dwells. The catheter exit site was secured with a 3-0 Ethilon retention suture. The venotomy incision was closed with Dermabond. Dressings were applied. The patient tolerated the procedure well without immediate post procedural complication. FINDINGS: After catheter placement, the tip lies within the superior apect of the right atrium. The catheter aspirates and flushes normally and is ready for immediate use. IMPRESSION: Successful placement of 33 cm dual lumen tunneled "Powerline" central venous catheter via the LEFT internal jugular vein The tip of the catheter is positioned within the proximal RIGHT atrium. The catheter is ready for immediate use. Roanna Banning, MD [facility] Radiology Electronically Signed   By: Roanna Banning M.D.   On: 11/08/2023 18:10   VAS [place] LOWER EXTREMITY VENOUS (DVT) Result Date: 11/07/2023  Lower Venous DVT  Study Patient Name:  TSUYAKO JOLLEY [facility]  Date of Exam:   11/07/2023 Medical Rec #: [id number]          Accession #:    [id number] Date of Birth: 10-Jul-1956          Patient Gender: F Patient Age:   15 years Exam Location:  [facility] Procedure:      VAS [place] LOWER EXTREMITY VENOUS (DVT) Referring Phys: Tresa Endo OSBORNE --------------------------------------------------------------------------------  Indications: Pain, stroke, and A-fib, anemia.  Comparison Study: No prior exam. Performing Technologist: Fernande Bras  Examination Guidelines: A complete evaluation includes B-mode imaging, spectral Doppler, color Doppler, and power Doppler as needed of all accessible portions of each vessel. Bilateral testing is considered an integral part of a complete examination. Limited examinations for reoccurring indications may be performed as noted. The reflux portion of the exam is performed with the patient in reverse Trendelenburg.  +---------+---------------+---------+-----------+----------+--------------+ RIGHT    CompressibilityPhasicitySpontaneityPropertiesThrombus Aging +---------+---------------+---------+-----------+----------+--------------+ CFV      Full           Yes      Yes                                 +---------+---------------+---------+-----------+----------+--------------+  SFJ      Full           Yes      Yes                                 +---------+---------------+---------+-----------+----------+--------------+ FV Prox  Full                                                        +---------+---------------+---------+-----------+----------+--------------+ FV Mid   Full                                                        +---------+---------------+---------+-----------+----------+--------------+ FV DistalFull                                                        +---------+---------------+---------+-----------+----------+--------------+ PFV      Full                                                         +---------+---------------+---------+-----------+----------+--------------+ POP      Full           Yes      Yes                                 +---------+---------------+---------+-----------+----------+--------------+ PTV      Full                                                        +---------+---------------+---------+-----------+----------+--------------+ PERO     Full                                                        +---------+---------------+---------+-----------+----------+--------------+   +---------+---------------+---------+-----------+----------+--------------+ LEFT     CompressibilityPhasicitySpontaneityPropertiesThrombus Aging +---------+---------------+---------+-----------+----------+--------------+ CFV      Full           Yes      Yes                                 +---------+---------------+---------+-----------+----------+--------------+ SFJ      Full           Yes      Yes                                 +---------+---------------+---------+-----------+----------+--------------+  FV Prox  Full                                                        +---------+---------------+---------+-----------+----------+--------------+ FV Mid   Full                                                        +---------+---------------+---------+-----------+----------+--------------+ FV DistalFull                                                        +---------+---------------+---------+-----------+----------+--------------+ PFV      Full                                                        +---------+---------------+---------+-----------+----------+--------------+ POP      Full           Yes      Yes                                 +---------+---------------+---------+-----------+----------+--------------+ PTV      Full                                                         +---------+---------------+---------+-----------+----------+--------------+ PERO     Full                                                        +---------+---------------+---------+-----------+----------+--------------+     Summary: BILATERAL: - No evidence of deep vein thrombosis seen in the lower extremities, bilaterally. -No evidence of popliteal cyst, bilaterally.   *See table(s) above for measurements and observations. Electronically signed by Heath Lark on 11/07/2023 at 4:05:14 PM.    Final    CT ABDOMEN PELVIS WO CONTRAST Result Date: 11/06/2023 CLINICAL DATA:  Diverticulitis, complication suspected EXAM: CT ABDOMEN AND PELVIS WITHOUT CONTRAST TECHNIQUE: Multidetector CT imaging of the abdomen and pelvis was performed following the standard protocol without IV contrast. RADIATION DOSE REDUCTION: This exam was performed according to the departmental dose-optimization program which includes automated exposure control, adjustment of the mA and/or kV according to patient size and/or use of iterative reconstruction technique. COMPARISON:  10/31/2023 FINDINGS: Lower chest: Volume loss and opacity in the lower lungs consistent with atelectasis. There is a central line with tip at the upper right atrium. Coronary atherosclerosis. Hepatobiliary: No focal liver abnormality. Calcific in the intermediate density in the gallbladder attributed to stones. No biliary dilatation or  pericholecystic edema. Pancreas: Unremarkable. Spleen: Unremarkable. Adrenals/Urinary Tract: Negative adrenals. No hydronephrosis or stone. Renal cortical thinning and lobulation at the left upper pole. 3 mm stone at the upper pole left kidney. The urinary bladder is collapsed around a Foley catheter. Stomach/Bowel: Descending colostomy. Bowel anastomosis in the right abdomen. No evidence of bowel obstruction. Fluid collection with loculation appearance/peritoneal thickening along the left flank measuring up to 5 x 6 x 10 cm.  Milder fluid accumulation in the ventral peritoneal space. Vascular/Lymphatic: Extensive atheromatous calcification of the aorta and iliacs. No mass or adenopathy. Reproductive:No pathologic findings. Other: No ascites or pneumoperitoneum. Musculoskeletal: No acute abnormalities.  T9 butterfly vertebra IMPRESSION: 1. Interval colostomy with fluid collection along the left gutter, dominant pocket measuring 10 x 6 x 5 cm. 2. No bowel obstruction. 3. Cholelithiasis and left nephrolithiasis. Electronically Signed   By: Tiburcio Pea M.D.   On: 11/06/2023 07:12   DG Chest Port 1 View Result Date: 11/03/2023 CLINICAL DATA:  Shortness of breath EXAM: PORTABLE CHEST - 1 VIEW COMPARISON:  11/11/2018 FINDINGS: Unchanged mild cardiomegaly and pulmonary vascular congestion. Interval worsening of left basilar opacity likely due to atelectasis. Lungs otherwise clear. Examination is limited due to expiratory phase of imaging. Interval placement of temporary right IJ hemodialysis catheter which terminates in the region of the right atrium. Nasogastric tube terminates in the left upper quadrant. IMPRESSION: Interval worsening of left basilar opacity which is likely due to atelectasis. Electronically Signed   By: Acquanetta Belling M.D.   On: 11/03/2023 07:22   IR Fluoro Guide CV Line Right Result Date: 11/01/2023 INDICATION: ESRD requiring HD. EXAM: NON-TUNNELED CENTRAL VENOUS HEMODIALYSIS CATHETER PLACEMENT WITH ULTRASOUND AND FLUOROSCOPIC GUIDANCE COMPARISON:  CT AP 10/31/2023. MEDICATIONS: Local anesthetic was administered. FLUOROSCOPY TIME:  Fluoroscopic dose; 35 mGy COMPLICATIONS: None immediate. PROCEDURE: Informed written consent was obtained from the patient and/or patient's representative after a discussion of the risks, benefits, and alternatives to treatment. Questions regarding the procedure were encouraged and answered. The RIGHT neck and chest were prepped with chlorhexidine in a sterile fashion, and a sterile drape  was applied covering the operative field. Maximum barrier sterile technique with sterile gowns and gloves were used for the procedure. A timeout was performed prior to the initiation of the procedure. After the overlying soft tissues were anesthetized, a small venotomy incision was created and a micropuncture kit was utilized to access the external jugular vein. Real-time ultrasound guidance was utilized for vascular access including the acquisition of a permanent ultrasound image documenting patency of the accessed vessel. The microwire was utilized to measure appropriate catheter length. A stiff glidewire was advanced to the level of the IVC. Under fluoroscopic guidance, the venotomy was serially dilated, ultimately allowing placement of a 20 cm temporary Trialysis catheter with tip ultimately terminating within the superior aspect of the right atrium. Final catheter positioning was confirmed and documented with a spot radiographic image. The catheter aspirates and flushes normally. The catheter was flushed with appropriate volume heparin dwells. The catheter exit site was secured with a 2-0 Ethilon retention suture. A dressing was placed. The patient tolerated the procedure well without immediate post procedural complication. IMPRESSION: Successful placement of a RIGHT EXTERNAL jugular approach 20 cm non-tunneled dialysis catheter The tip of the catheter is positioned within the proximal RIGHT atrium. The catheter is ready for immediate use. PLAN: This catheter may be converted to a tunneled dialysis catheter at a later date as indicated. Roanna Banning, MD [facility]  Radiology Electronically Signed   By: Roanna Banning M.D.   On: 11/01/2023 18:15   IR US Guide Vasc Access Right Result Date: 11/01/2023 INDICATION: ESRD requiring HD. EXAM: NON-TUNNELED CENTRAL VENOUS HEMODIALYSIS CATHETER PLACEMENT WITH ULTRASOUND AND FLUOROSCOPIC GUIDANCE COMPARISON:  CT AP  10/31/2023. MEDICATIONS: Local anesthetic was administered. FLUOROSCOPY TIME:  Fluoroscopic dose; 35 mGy COMPLICATIONS: None immediate. PROCEDURE: Informed written consent was obtained from the patient and/or patient's representative after a discussion of the risks, benefits, and alternatives to treatment. Questions regarding the procedure were encouraged and answered. The RIGHT neck and chest were prepped with chlorhexidine in a sterile fashion, and a sterile drape was applied covering the operative field. Maximum barrier sterile technique with sterile gowns and gloves were used for the procedure. A timeout was performed prior to the initiation of the procedure. After the overlying soft tissues were anesthetized, a small venotomy incision was created and a micropuncture kit was utilized to access the external jugular vein. Real-time ultrasound guidance was utilized for vascular access including the acquisition of a permanent ultrasound image documenting patency of the accessed vessel. The microwire was utilized to measure appropriate catheter length. A stiff glidewire was advanced to the level of the IVC. Under fluoroscopic guidance, the venotomy was serially dilated, ultimately allowing placement of a 20 cm temporary Trialysis catheter with tip ultimately terminating within the superior aspect of the right atrium. Final catheter positioning was confirmed and documented with a spot radiographic image. The catheter aspirates and flushes normally. The catheter was flushed with appropriate volume heparin dwells. The catheter exit site was secured with a 2-0 Ethilon retention suture. A dressing was placed. The patient tolerated the procedure well without immediate post procedural complication. IMPRESSION: Successful placement of a RIGHT EXTERNAL jugular approach 20 cm non-tunneled dialysis catheter The tip of the catheter is positioned within the proximal RIGHT atrium. The catheter is ready for immediate use. PLAN:  This catheter may be converted to a tunneled dialysis catheter at a later date as indicated. Roanna Banning, MD [facility] Radiology Electronically Signed   By: Roanna Banning M.D.   On: 11/01/2023 18:15   CT ABDOMEN PELVIS WO CONTRAST Result Date: 10/31/2023 CLINICAL DATA:  Diverticulitis, complication suspected. EXAM: CT ABDOMEN AND PELVIS WITHOUT CONTRAST TECHNIQUE: Multidetector CT imaging of the abdomen and pelvis was performed following the standard protocol without IV contrast. RADIATION DOSE REDUCTION: This exam was performed according to the departmental dose-optimization program which includes automated exposure control, adjustment of the mA and/or kV according to patient size and/or use of iterative reconstruction technique. COMPARISON:  10/27/2023. FINDINGS: Lower chest: Heart is enlarged and multi-vessel coronary artery calcifications are noted. Consolidation and patchy airspace disease is present at the lung bases bilaterally. Hepatobiliary: No focal liver abnormality is seen. Stones are present within the gallbladder. No biliary ductal dilatation. Pancreas: Unremarkable. No pancreatic ductal dilatation or surrounding inflammatory changes. Spleen: Normal in size without focal abnormality. Adrenals/Urinary Tract: The adrenal glands are within normal limits. Calcifications are present in the upper pole of the left kidney. There is a cyst in the lower pole of the right kidney. No hydronephrosis bilaterally. The bladder is unremarkable. Stomach/Bowel: The stomach is within normal limits. No bowel obstruction or pneumatosis is seen. Right hemicolectomy changes are noted. There is diffuse colonic wall thickening. Scattered diverticula are present along the colon. A few small foci of free air are noted in the mesentery in the mid left abdomen, slightly increased from the previous exam. There  is suggestion of contrast extravasation in the region. There is fine no  definite abscess is seen. Vascular/Lymphatic: Aortic atherosclerosis. No enlarged abdominal or pelvic lymph nodes. Reproductive: No acute abnormality. Other: Mild ascites is noted in all 4 quadrants. A fat containing umbilical hernia is noted. There is mild anasarca. Musculoskeletal: The bony structures are stable. IMPRESSION: 1. Worsening diverticulitis of the descending colon with perforation and increasing free air and suspected oral contrast extravasation in the mid left abdomen. No definite abscess is seen. Surgical consultation is recommended. 2. Mild ascites. 3. Diffuse colonic wall thickening which may be due to associated inflammatory changes. 4. Patchy airspace disease at the lung bases with bilateral lower lobe consolidation. 5. Cholelithiasis. 6. Aortic atherosclerosis. Critical Value/emergent results were called by telephone at the time of interpretation on 10/31/2023 at 11:59 pm to provider Dr. Antionette Char, who verbally acknowledged these results. Electronically Signed   By: Thornell Sartorius M.D.   On: 10/31/2023 23:59   CT ABDOMEN PELVIS WO CONTRAST Result Date: 10/27/2023 CLINICAL DATA:  Acute nonlocalized abdominal pain. Chills and fever. Vomiting and diarrhea. EXAM: CT ABDOMEN AND PELVIS WITHOUT CONTRAST TECHNIQUE: Multidetector CT imaging of the abdomen and pelvis was performed following the standard protocol without IV contrast. RADIATION DOSE REDUCTION: This exam was performed according to the departmental dose-optimization program which includes automated exposure control, adjustment of the mA and/or kV according to patient size and/or use of iterative reconstruction technique. COMPARISON:  CT abdomen and pelvis 04/19/2021 FINDINGS: Lower chest: Bibasilar atelectasis.  No acute abnormality. Hepatobiliary: Cholelithiasis without evidence of acute cholecystitis. No biliary dilation. Unremarkable noncontrast appearance of the liver. Pancreas: Unremarkable. Spleen: Unremarkable. Adrenals/Urinary Tract:  Normal adrenal glands. No urinary calculi or hydronephrosis. Unremarkable bladder. Stomach/Bowel: Stomach is within normal limits. No bowel obstruction. Colonic diverticulosis. Wall thickening, inflammatory stranding, and free fluid about the descending colon. Loosely organized fluid and gas about the inflamed descending colon (circa series 3/image 49-50) compatible with contained perforation. The adjacent jejunum is inflamed. Postoperative change about the colon with ileocolonic anastomosis in the right upper quadrant. Vascular/Lymphatic: Aortic atherosclerosis. No enlarged abdominal or pelvic lymph nodes. Reproductive: No acute abnormality. Other: Fat containing periumbilical hernia. Musculoskeletal: No acute fracture. IMPRESSION: 1. Acute diverticulitis of the descending colon with contained perforation. 2. Inflamed jejunum adjacent to the perforated diverticulitis. 3.  Aortic Atherosclerosis (ICD10-I70.0). Critical Value/emergent results were called by telephone at the time of interpretation on 10/27/2023 at 9:22 pm to provider [facility] , who verbally acknowledged these results. Electronically Signed   By: Minerva Fester M.D.   On: 10/27/2023 21:24    Signature  -   Huey Bienenstock M.D on 11/19/2023 at 11:58 AM   -  To page go to [email or web address]

## 2023-11-19 NOTE — [place] FL2 (Signed)
 [place] MEDICAID FL2 LEVEL OF CARE FORM     IDENTIFICATION  Patient Name: [name] Birthdate: [birth year] Sex: female Admission Date (Current Location): 10/27/2023  [facility] and [place] Number:  Producer, television/film/video and Address:  [facility], [address] [place], [place] [address]      Provider Number: [id number]  Attending Physician Name and Address:  Elgergawy, Leana Roe, MD  Relative Name and Phone Number:       Current Level of Care: Hospital Recommended Level of Care: Skilled Nursing Facility Prior Approval Number:    Date Approved/Denied: 03/18/13 PASRR Number: [id number]  Discharge Plan: SNF    Current Diagnoses: Patient Active Problem List   Diagnosis Date Noted   Diverticulitis of large intestine with complication 10/27/2023   Chronic kidney disease, stage 5 (HCC) 10/27/2023   Chronic nonintractable headache 03/03/2021   Foot callus 02/27/2021   Counseled about COVID-19 virus infection 02/24/2020   Aortic heart murmur 05/15/2019   Thyroid enlargement 07/19/2014   Vitamin D deficiency 09/03/2013   Hyperlipidemia 04/24/2013   Malunion of fracture 03/17/2013   Wound infection after surgery 03/17/2013   Diabetic retinopathy (HCC) 01/23/2013   Chronic atrial fibrillation (HCC) 01/15/2013   CAD (coronary artery disease) 01/11/2013   H/O non-ST elevation myocardial infarction (NSTEMI) 01/09/2013   History of stroke 01/08/2013   Diabetic neuropathy 01/08/2013   Hypertension    Diabetes mellitus    Chronic kidney disease (CKD), stage IV (severe) (HCC)     Orientation RESPIRATION BLADDER Height & Weight     Self, Time, Situation, Place  O2 External catheter, Continent Weight: 90 kg Height:  5' 7.01" (170.2 cm)  BEHAVIORAL SYMPTOMS/MOOD NEUROLOGICAL BOWEL NUTRITION STATUS      Colostomy Diet (See Discharge Summary)  AMBULATORY STATUS COMMUNICATION OF NEEDS Skin   Limited Assist Verbally Wound Vac                        Personal Care Assistance Level of Assistance  Bathing, Dressing Bathing Assistance: Limited assistance   Dressing Assistance: Independent     Functional Limitations Info             SPECIAL CARE FACTORS FREQUENCY  PT (By licensed PT), OT (By licensed OT)     PT Frequency: 5x weekly OT Frequency: 5x weekly            Contractures Contractures Info: Not present    Additional Factors Info  Code Status, Allergies, Insulin Sliding Scale (Dialysis MWF, Wound Vac with dressing changes) Code Status Info: Limited Allergies Info: Nsaids, Lisinopril, Peanut-containing Drug Products   Insulin Sliding Scale Info: insulin glargine (LANTUS) 100 UNIT/ML injection Inject 0.1-0.15 mLs (10-15 Units total)  insulin regular (NOVOLIN R) 100 units/mL injection Inject 0.05-0.1 mLs (5-10 Units total) 3 (three) times daily before meals.       Current Medications (11/19/2023):  This is the current hospital active medication list Current Facility-Administered Medications  Medication Dose Route Frequency Provider Last Rate Last Admin   acetaminophen (TYLENOL) tablet 650 mg  650 mg Oral Q6H PRN Maretta Bees, MD   650 mg at 11/18/23 2105   Or   acetaminophen (TYLENOL) suppository 650 mg  650 mg Rectal Q6H PRN Maretta Bees, MD       albuterol (PROVENTIL) (2.5 MG/3ML) 0.083% nebulizer solution 2.5 mg  2.5 mg Nebulization Q2H PRN Ghimire, Werner Lean, MD       amLODipine (NORVASC) tablet 5  mg  5 mg Oral Daily Elgergawy, Leana Roe, MD   5 mg at 11/19/23 0801   apixaban (ELIQUIS) tablet 5 mg  5 mg Oral BID Elgergawy, Leana Roe, MD   5 mg at 11/19/23 0801   carvedilol (COREG) tablet 6.25 mg  6.25 mg Oral BID WC Leroy Sea, MD   6.25 mg at 11/19/23 0800   Chlorhexidine Gluconate Cloth 2 % PADS 6 each  6 each Topical Q0600 Dagoberto Ligas, MD   6 each at 11/19/23 0538   Darbepoetin Alfa (ARANESP) injection 60 mcg  60 mcg Subcutaneous Q Mon-1800 Darnell Level, MD   60 mcg at 11/18/23 1850    docusate sodium (COLACE) capsule 100 mg  100 mg Oral BID Carl Best H, PA-C       hydrALAZINE (APRESOLINE) injection 10 mg  10 mg Intravenous Q4H PRN Elgergawy, Leana Roe, MD       HYDROmorphone (DILAUDID) injection 0.5 mg  0.5 mg Intravenous Q4H PRN Leroy Sea, MD   0.5 mg at 11/14/23 2150   insulin aspart (novoLOG) injection 0-15 Units  0-15 Units Subcutaneous TID WC Leroy Sea, MD   2 Units at 11/18/23 0850   insulin aspart (novoLOG) injection 0-5 Units  0-5 Units Subcutaneous QHS Leroy Sea, MD   2 Units at 11/17/23 2053   insulin glargine (LANTUS) injection 12 Units  12 Units Subcutaneous Q24H Elgergawy, Leana Roe, MD       melatonin tablet 5 mg  5 mg Oral QHS PRN Gery Pray, MD   5 mg at 11/19/23 0010   menthol-cetylpyridinium (CEPACOL) lozenge 3 mg  1 lozenge Oral PRN Leroy Sea, MD   3 mg at 11/05/23 0705   ondansetron (ZOFRAN) injection 4 mg  4 mg Intravenous Q6H PRN Maretta Bees, MD   4 mg at 11/08/23 1603   Oral care mouth rinse  15 mL Mouth Rinse 4 times per day Maretta Bees, MD   15 mL at 11/18/23 2134   Oral care mouth rinse  15 mL Mouth Rinse PRN Maretta Bees, MD       oxyCODONE (Oxy IR/ROXICODONE) immediate release tablet 5-10 mg  5-10 mg Oral Q4H PRN Juliet Rude, PA-C   10 mg at 11/19/23 0800   pantoprazole (PROTONIX) EC tablet 40 mg  40 mg Oral BID Elgergawy, Leana Roe, MD   40 mg at 11/19/23 0800   polyethylene glycol (MIRALAX / GLYCOLAX) packet 17 g  17 g Oral Daily Eric Form, PA-C       senna (SENOKOT) tablet 8.6 mg  1 tablet Oral Daily Carl Best H, PA-C       sodium chloride flush (NS) 0.9 % injection 10-40 mL  10-40 mL Intracatheter Q12H Ghimire, Werner Lean, MD   10 mL at 11/19/23 0801     Discharge Medications: Please see discharge summary for a list of discharge medications.  Relevant Imaging Results:  Relevant Lab Results:   Additional Information Wound Vac and Dialysis Transportation  MWF  Rasheena Talmadge, Smith Robert, RN

## 2023-11-20 DIAGNOSIS — K5780 Diverticulitis of intestine, part unspecified, with perforation and abscess without bleeding: Secondary | ICD-10-CM | POA: Diagnosis not present

## 2023-11-20 DIAGNOSIS — N179 Acute kidney failure, unspecified: Secondary | ICD-10-CM | POA: Diagnosis not present

## 2023-11-20 DIAGNOSIS — K5732 Diverticulitis of large intestine without perforation or abscess without bleeding: Secondary | ICD-10-CM | POA: Diagnosis not present

## 2023-11-20 DIAGNOSIS — D649 Anemia, unspecified: Secondary | ICD-10-CM | POA: Diagnosis not present

## 2023-11-20 LAB — CBC
HCT: 29.8 % — ABNORMAL LOW (ref 36.0–46.0)
Hemoglobin: 9.3 g/dL — ABNORMAL LOW (ref 12.0–15.0)
MCH: 26.7 pg (ref 26.0–34.0)
MCHC: 31.2 g/dL (ref 30.0–36.0)
MCV: 85.6 fL (ref 80.0–100.0)
Platelets: 232 10*3/uL (ref 150–400)
RBC: 3.48 MIL/uL — ABNORMAL LOW (ref 3.87–5.11)
RDW: 16.6 % — ABNORMAL HIGH (ref 11.5–15.5)
WBC: 10.3 10*3/uL (ref 4.0–10.5)
nRBC: 0 % (ref 0.0–0.2)

## 2023-11-20 LAB — RENAL FUNCTION PANEL
Albumin: 2.2 g/dL — ABNORMAL LOW (ref 3.5–5.0)
Anion gap: 13 (ref 5–15)
BUN: 24 mg/dL — ABNORMAL HIGH (ref 8–23)
CO2: 24 mmol/L (ref 22–32)
Calcium: 8 mg/dL — ABNORMAL LOW (ref 8.9–10.3)
Chloride: 96 mmol/L — ABNORMAL LOW (ref 98–111)
Creatinine, Ser: 7.84 mg/dL — ABNORMAL HIGH (ref 0.44–1.00)
GFR, Estimated: 5 mL/min — ABNORMAL LOW (ref >60)
Glucose, Bld: 164 mg/dL — ABNORMAL HIGH (ref 70–99)
Phosphorus: 5.6 mg/dL — ABNORMAL HIGH (ref 2.5–4.6)
Potassium: 3.7 mmol/L (ref 3.5–5.1)
Sodium: 133 mmol/L — ABNORMAL LOW (ref 135–145)

## 2023-11-20 LAB — GLUCOSE, CAPILLARY
Glucose-Capillary: 126 mg/dL — ABNORMAL HIGH (ref 70–99)
Glucose-Capillary: 144 mg/dL — ABNORMAL HIGH (ref 70–99)
Glucose-Capillary: 131 mg/dL — ABNORMAL HIGH (ref 70–99)

## 2023-11-20 MED ORDER — HEPARIN SODIUM (PORCINE) 1000 UNIT/ML IJ SOLN
3200.0000 [IU] | Freq: Once | INTRAMUSCULAR | Status: AC
  Administered 2023-11-20: 3200 [IU]
  Filled 2023-11-20: qty 4

## 2023-11-20 MED ORDER — POLYETHYLENE GLYCOL 3350 17 G PO PACK
17.0000 g | PACK | Freq: Two times a day (BID) | ORAL | Status: DC
  Administered 2023-11-21 (×2): 17 g via ORAL
  Filled 2023-11-20 (×5): qty 1

## 2023-11-20 MED ORDER — LATANOPROST 0.005 % OP SOLN
1.0000 [drp] | Freq: Every day | OPHTHALMIC | Status: DC
  Administered 2023-11-20 – 2023-11-24 (×5): 1 [drp] via OPHTHALMIC
  Filled 2023-11-20: qty 2.5

## 2023-11-20 MED ORDER — BRIMONIDINE TARTRATE 0.2 % OP SOLN
1.0000 [drp] | Freq: Two times a day (BID) | OPHTHALMIC | Status: DC
  Administered 2023-11-20 – 2023-11-25 (×9): 1 [drp] via OPHTHALMIC
  Filled 2023-11-20: qty 5

## 2023-11-20 MED ORDER — DARBEPOETIN ALFA 100 MCG/0.5ML IJ SOSY
100.0000 ug | PREFILLED_SYRINGE | INTRAMUSCULAR | Status: DC
  Filled 2023-11-20: qty 0.5

## 2023-11-20 NOTE — Progress Notes (Signed)
 PT Cancellation Note  Patient Details Name: [name] MRN: [id number] DOB: 03-15-1956   Cancelled Treatment:    Reason Eval/Treat Not Completed: Patient at procedure or test/unavailable (Pt off the floor at dialysis. Will follow up if time allows.)   [name] 11/20/2023, 8:42 AM

## 2023-11-20 NOTE — Progress Notes (Signed)
 Progress Note  18 Days Post-Op  Subjective: Seen at the end of HD. Reports increased abdominal pain with HD again, verified with RN that patient was not hypotensive during dialysis. No output from stoma reported and nothing in appliance today. VAC present.   Objective: Vital signs in last 24 hours: Temp:  [97.3 F (36.3 C)-99 F (37.2 C)] 98 F (36.7 C) (02/26 1255) Pulse Rate:  [70-80] 80 (02/26 1255) Resp:  [11-28] 15 (02/26 1255) BP: (100-174)/(48-102) 141/65 (02/26 1255) SpO2:  [95 %-100 %] 98 % (02/26 1255) Weight:  [88.4 kg-89.4 kg] 88.4 kg (02/26 1215) Last BM Date : 11/19/23  Intake/Output from previous day: 02/25 0701 - 02/26 0700 In: 480 [P.O.:480] Out: -  Intake/Output this shift: Total I/O In: -  Out: 1 [Other:1]  PE: Abd: soft, appropriately ttp, midline wound with vac in place with good suction. Colostomy with stoma pink and viable. No gas or air in bag   Lab Results:  Recent Labs    11/19/23 0834 11/20/23 0820  WBC 10.2 10.3  HGB 10.1* 9.3*  HCT 31.1* 29.8*  PLT 227 232   BMET Recent Labs    11/19/23 0834 11/20/23 0820  NA 134* 133*  K 3.5 3.7  CL 97* 96*  CO2 26 24  GLUCOSE 88 164*  BUN 17 24*  CREATININE 6.15* 7.84*  CALCIUM 8.2* 8.0*   PT/INR No results for input(s): "LABPROT", "INR" in the last 72 hours. CMP     Component Value Date/Time   NA 133 (L) 11/20/2023 0820   NA 137 02/28/2021 1548   K 3.7 11/20/2023 0820   CL 96 (L) 11/20/2023 0820   CO2 24 11/20/2023 0820   GLUCOSE 164 (H) 11/20/2023 0820   BUN 24 (H) 11/20/2023 0820   BUN 33 (H) 02/28/2021 1548   CREATININE 7.84 (H) 11/20/2023 0820   CREATININE 3.38 (H) 12/26/2015 1347   CALCIUM 8.0 (L) 11/20/2023 0820   PROT 5.8 (L) 11/11/2023 0327   PROT 7.0 11/07/2017 1414   ALBUMIN 2.2 (L) 11/20/2023 0820   ALBUMIN 4.0 11/07/2017 1414   AST 23 11/11/2023 0327   ALT 17 11/11/2023 0327   ALKPHOS 39 11/11/2023 0327   BILITOT 0.5 11/11/2023 0327   BILITOT 0.2 11/07/2017  1414   GFRNONAA 5 (L) 11/20/2023 0820   GFRNONAA 13 (L) 01/10/2015 1428   GFRAA 21 (L) 12/21/2019 1802   GFRAA 16 (L) 01/10/2015 1428   Lipase     Component Value Date/Time   LIPASE 26 10/27/2023 1936       Studies/Results: DG Abd Portable 1V Result Date: 11/19/2023 CLINICAL DATA:  Abdominal pain.  Colostomy back in place. EXAM: PORTABLE ABDOMEN - 1 VIEW COMPARISON:  CT dated 11/06/2023. FINDINGS: No bowel dilatation or evidence of obstruction. There is paucity of bowel air. Anastomotic staple line noted in the right lower quadrant. No free air. The osseous structures are intact. The soft tissues are unremarkable. IMPRESSION: Nonobstructive bowel gas pattern. Electronically Signed   By: Elgie Collard M.D.   On: 11/19/2023 09:59     Anti-infectives: Anti-infectives (From admission, onward)    Start     Dose/Rate Route Frequency Ordered Stop   11/08/23 1200  erythromycin (EES) 400 MG/5ML suspension 400 mg  Status:  Discontinued        400 mg Oral Every 6 hours 11/08/23 0815 11/12/23 1111   11/06/23 0815  piperacillin-tazobactam (ZOSYN) IVPB 2.25 g  Status:  Discontinued  2.25 g 100 mL/hr over 30 Minutes Intravenous Every 8 hours 11/06/23 0729 11/14/23 0951   11/01/23 1415  piperacillin-tazobactam (ZOSYN) IVPB 2.25 g        2.25 g 100 mL/hr over 30 Minutes Intravenous Every 8 hours 11/01/23 1316 11/06/23 0602   10/29/23 1100  piperacillin-tazobactam (ZOSYN) IVPB 2.25 g  Status:  Discontinued        2.25 g 100 mL/hr over 30 Minutes Intravenous Every 8 hours 10/29/23 1006 11/01/23 1016   10/28/23 0600  piperacillin-tazobactam (ZOSYN) IVPB 2.25 g  Status:  Discontinued        2.25 g 100 mL/hr over 30 Minutes Intravenous Every 8 hours 10/27/23 2224 10/27/23 2235   10/28/23 0600  ceFEPIme (MAXIPIME) 1 g in sodium chloride 0.9 % 100 mL IVPB  Status:  Discontinued        1 g 200 mL/hr over 30 Minutes Intravenous Every 24 hours 10/27/23 2235 10/29/23 0954   10/28/23 0600   metroNIDAZOLE (FLAGYL) IVPB 500 mg  Status:  Discontinued        500 mg 100 mL/hr over 60 Minutes Intravenous Every 12 hours 10/27/23 2235 10/29/23 0954   10/27/23 2130  piperacillin-tazobactam (ZOSYN) IVPB 3.375 g        3.375 g 100 mL/hr over 30 Minutes Intravenous  Once 10/27/23 2128 10/27/23 2320        Assessment/Plan  POD 18, s/p Hartmann's by Dr. Freida Busman 11/02/23 forSigmoid diverticulitis with perforation  - WBC down to 10.3 K.  Off abx - hgb 9.3 from 10.1 - VAC in place with no evidence of bleeding. Changing M/R - renal diet and tolerating - pain with dialysis. Abd xray with nonobstructive pattern. Has not been on bowel regimen because she had been having liquid output.  - therapies for mobilization, rec CIR - WOC for colostomy care - increased miralax to BID   FEN: renal diet, protein supplements VTE: Eliquis ID: cefepime/flagyl 2/3>2/4; Zosyn 2/4>2/20   - per [facility] -  PAF  HTN HLD ESRD - on HD CAD T2DM   LOS: 24 days     Juliet Rude, [facility] 11/20/2023, 2:00 PM Please see Amion for pager number during day hours 7:00am-4:30pm

## 2023-11-20 NOTE — Progress Notes (Signed)
 Received patient in bed to unit.  Alert and oriented.  Informed consent signed and in chart.   TX duration: 3 hours and 30 minutes  Patient tolerated well.  Transported back to the room  Alert, without acute distress.  Hand-off given to patient's nurse.   Access used: Right internal jugular HD Cath Access issues: none  Total UF removed: 1L Medication(s) given: none   11/20/23 1211  Vitals  Temp 98.3 F (36.8 C)  Temp Source Oral  BP (!) 174/68  MAP (mmHg) 98  Pulse Rate 74  ECG Heart Rate 74  Resp 18  Oxygen Therapy  SpO2 99 %  O2 Device Room Air  During Treatment Monitoring  Duration of HD Treatment -hour(s) 3.5 hour(s)  HD Safety Checks Performed Yes  Intra-Hemodialysis Comments Tx completed;Tolerated well  Dialysis Fluid Bolus Normal Saline  Bolus Amount (mL) 300 mL  Post Treatment  Dialyzer Clearance Lightly streaked  Liters Processed 73.5  Fluid Removed (mL) 1 mL  Tolerated HD Treatment Yes  Hemodialysis Catheter Right Internal jugular Double lumen Permanent (Tunneled)  Placement Date/Time: 11/12/23 0826   Placed prior to admission: No  Serial / Lot #: [id number]  Expiration Date: 06/20/28  Time Out: Correct patient;Correct site;Correct procedure  Maximum sterile barrier precautions: Hand hygiene;Large sterile sheet;...  Site Condition No complications  Blue Lumen Status Flushed;Dead end cap in place;Heparin locked  Red Lumen Status Flushed;Heparin locked;Dead end cap in place  Purple Lumen Status N/A  Catheter fill solution Heparin 1000 units/ml  Catheter fill volume (Arterial) 1.6 cc  Catheter fill volume (Venous) 1.6  Dressing Type Transparent  Dressing Status Antimicrobial disc/dressing in place;Clean, Dry, Intact  Interventions Other (Comment)  Drainage Description None  Dressing Change Due 11/26/23  Post treatment catheter status Capped and Clamped     Stacie Glaze LPN [facility]

## 2023-11-20 NOTE — Discharge Planning (Signed)
 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   Acute diverticulitis with contained perforation of descending colon   Allergies:  Allergies  Allergen Reactions   Nsaids Other (See Comments)    CKD stage 4   Lisinopril Other (See Comments)    coughing   Peanut-Containing Drug Products Itching and Other (See Comments)    GI intolerance - diarrhea    Date of First Dialysis: 11/01/2023 Cause of renal disease: DMT2/HTN  Dialysis Prescription: Dialysis Frequency: MWF Tx duration: 4 hours BFR: 350 DFR: autoflow 1.5  EDW: 88.5kg  Dialyzer: 180NRe UF profile/Sodium modeling?: NA Dialysis Bath: 3.0 K 2.5 Ca  Dialysis access: Access type: [facility] Date placed: 11/12/2023 Surgeon: IR Dr. Fredia Sorrow Needle gauge: NA  In Center Medications: Heparin Dose: No  Type: NA VDRA: Calcitriol/Hectorol per protocol Venofer: Per protocol Mircera: Per protocol Next dose due: rec'd Aranesp 60 mcg SQ 11/18/2023 Sensipar: NA  Discharge labs: Hgb: 9.3 K+: 3.7  Ca: 8.0  Phos: 5.6 Alb: 2.2  Please draw monthly routine labs. Additional labs needed: Weekly K+ level while on 3.0 K bath  Alonna Buckler [facility] [phone]

## 2023-11-20 NOTE — Progress Notes (Signed)
 PROGRESS NOTE        PATIENT DETAILS Name: [name] Age: 68 y.o. Sex: female Date of Birth: [birth year] Admit Date: 10/27/2023 Admitting Physician Dolly Rias, MD JXB:JYNWG, Devonne Doughty, NP  Brief Summary: Patient is a 68 y.o.  female with history of CKD 5, A-fib on Eliquis, CAD-who presented with abdominal pain-found to have acute diverticulitis with contained perforation involving the descending colon.  Hospital course complicated by worsening AKI-thought to have progressed to ESRD-requiring initiation of HD.  Unfortunately-even with conservative measures/antibiotics-she continued to worsen and ended up with exploratory laparotomy/Hartman's procedure on 2/8.  Significant events: 2/2>> admit to [facility] 2/6>> CT abdomen worsening diverticulitis with perforation 2/7>> transitioned to full comfort measures-as with worsening AKI-however multiple family members arrived-comfort care revoked-now full code-full scope of treatment including HD/laparotomy with colostomy.  General surgery/nephrology reconsulted.  IR placed [facility] and patient underwent first hemodialysis.  2/8>> remains n.p.o-colectomy/colostomy later today. 2/20>> wound VAC reapplied  Significant studies: 2/2>> CT abdomen/pelvis: Acute diverticulitis-descending colon with contained perforation 2/6>> CT abdomen/pelvis: Worsening diverticulitis of descending colon with perforation and increasing failure-suspected oral contrast extravasation left abdomen.  Significant microbiology data: 2/2>> COVID/influenza/RSV PCR: Negative  Procedures: 2/8>> by Dr. Freida Busman underwent exploratory laparotomy with Hartman's resection for perforated diverticulitis with diffuse peritonitis, colostomy in place. 2/13 - R IJ Trialysis catheter - IR 2/14 -left IJ Central line placed by IR for TNA(removed on 2/22) 11/12/2023. R.internal jugular tunneled HD catheter placement by  IR  Consults: Surgery Renal Palliative IR  Subjective:  Some abdominal pain but otherwise looks comfortable.  No  no complaints.  Objective: Vitals: Blood pressure 127/60, pulse 74, temperature 98.2 F (36.8 C), temperature source Oral, resp. rate 14, height 5' 7.01" (1.702 m), weight 98.4 kg, SpO2 100%.   Exam: Gen Exam:Alert awake-not in any distress HEENT:atraumatic, normocephalic Chest: B/L clear to auscultation anteriorly CVS:S1S2 regular Abdomen:soft non tender, non distended-midline incision-wound VAC in place. Extremities:no edema Neurology: Non focal Skin: no rash  Assessment/Plan: Acute diverticulitis with contained perforation of the descending colon  S/p Hartman's procedure on 2/8-completed a course of antibiotics-off PNA-appetite improving.  Wound VAC reapplied 2/20-General Surgery following and directing postop care.  AKI on CKD 5 with progression to ESRD AKI likely hemodynamically mediated-has now likely progressed to ESRD.   HD initiated via right IJ temporary catheter, switched to right IJ trialysis catheter by IR on 11/07/2023>> tunneled HD catheter right IJ on 11/12/2023 by IR. CLIP in process, renal would like to hold on permanent access creation for now given her overall clinical picture.  CAD Currently no anginal symptoms Suspect not on antiplatelets as on anticoagulation with Eliquis/heparin Continue beta-blocker On Repatha injections as an outpatient.  PAF Sinus rhythm Continue Coreg Currently on Eliquis  Anemia of acute illness  Anemia of end-stage renal disease Multifactorial anemia-did 3 units of PRBC-last transfused on 2/19-Hb currently stable. Iron/Aranesp per nephrology service.   Hb currently stable-follow CBC closely.  HTN BP stable Continue Norvasc/Coreg  DM-2 (A1c 6.6 on 2/3) CBGs stable Semglee 12 units daily+ SSI.   Recent Labs    11/19/23 1104 11/19/23 1622 11/19/23 2113  GLUCAP 101* 155* 186*    Palliative  care/goals of care Palliative input greatly appreciated Note-initially did not want to pursue surgery/hemodialysis-comfort measures was contemplated-comfort measures was revoked-patient was a full code-but after continued discussion-she is now a DNR.  Class 1  Obesity: Estimated body mass index is 33.97 kg/m as calculated from the following:   Height as of this encounter: 5' 7.01" (1.702 m).   Weight as of this encounter: 98.4 kg.    Code status:   Code Status: Limited: Do not attempt resuscitation (DNR) -DNR-LIMITED -Do Not Intubate/DNI    DVT Prophylaxis: Place and maintain sequential compression device Start: 11/06/23 0613 apixaban (ELIQUIS) tablet 5 mg IV Heparin  Family Communication: Discussed with patient, all her questions answered.  Disposition Plan: Status is: Inpatient  Will benefit from CIR, given multiple issues including her wound, will need frequent monitoring and observation by general surgery team, hemodialysis patient, she is on Eliquis she might need transfusions as well.  With her significant abdominal wound and wound VAC, significant deconditioning when she was independent at baseline, she is With prolonged hospital stay, surgery and significant weakness, will benefit from CIR stay to go back to baseline     Diet: Diet Order             Diet regular Room service appropriate? Yes; Fluid consistency: Thin  Diet effective now                   MEDICATIONS: Scheduled Meds:  amLODipine  5 mg Oral Daily   apixaban  5 mg Oral BID   carvedilol  6.25 mg Oral BID WC   Chlorhexidine Gluconate Cloth  6 each Topical Q0600   darbepoetin (ARANESP) injection - DIALYSIS  60 mcg Subcutaneous Q Mon-1800   docusate sodium  100 mg Oral BID   insulin aspart  0-15 Units Subcutaneous TID WC   insulin aspart  0-5 Units Subcutaneous QHS   insulin glargine  12 Units Subcutaneous Q24H   latanoprost  1 drop Both Eyes QHS   mouth rinse  15 mL Mouth Rinse 4 times per day    pantoprazole  40 mg Oral BID   polyethylene glycol  17 g Oral Daily   senna  1 tablet Oral Daily   sodium chloride flush  10-40 mL Intracatheter Q12H   Continuous Infusions:   PRN Meds:.acetaminophen **OR** acetaminophen, albuterol, hydrALAZINE, HYDROmorphone (DILAUDID) injection, melatonin, menthol-cetylpyridinium, ondansetron (ZOFRAN) IV, mouth rinse, oxyCODONE   I have personally reviewed following labs and imaging studies  LABORATORY DATA:   Data Review:    Recent Labs  Lab 11/14/23 0354 11/15/23 0806 11/18/23 0551 11/19/23 0834 11/20/23 0820  WBC 15.8* 13.5* 10.7* 10.2 10.3  HGB 8.2* 8.6* 9.1* 10.1* 9.3*  HCT 26.0* 26.7* 28.8* 31.1* 29.8*  PLT 306 325 338 227 232  MCV 84.1 84.2 85.0 84.1 85.6  MCH 26.5 27.1 26.8 27.3 26.7  MCHC 31.5 32.2 31.6 32.5 31.2  RDW 16.5* 16.3* 16.9* 16.8* 16.6*    Recent Labs  Lab 11/14/23 0354 11/15/23 0806 11/18/23 0551 11/19/23 0834 11/20/23 0820  NA 135 136 135 134* 133*  K 3.7 3.5 3.5 3.5 3.7  CL 97* 99 100 97* 96*  CO2 26 24 22 26 24   ANIONGAP 12 13 13 11 13   GLUCOSE 152* 96 118* 88 164*  BUN 25* 30* 30* 17 24*  CREATININE 5.39* 7.20* 8.20* 6.15* 7.84*  ALBUMIN  --  1.9* 2.1* 2.2* 2.2*  PHOS  --  4.9* 4.8* 4.3 5.6*  CALCIUM 7.7* 7.8* 7.9* 8.2* 8.0*      Recent Labs  Lab 11/14/23 0354 11/15/23 0806 11/18/23 0551 11/19/23 0834 11/20/23 0820  CALCIUM 7.7* 7.8* 7.9* 8.2* 8.0*    --------------------------------------------------------------------------------------------------------------- Lab Results  Component  Value Date   CHOL 134 12/21/2019   HDL 49 12/21/2019   LDLCALC 69 12/21/2019   TRIG 148 11/11/2023   CHOLHDL 2.7 12/21/2019    Lab Results  Component Value Date   HGBA1C 6.6 (H) 10/28/2023      Micro Results No results found for this or any previous visit (from the past 240 hours).    Radiology Reports DG Abd Portable 1V Result Date: 11/19/2023 CLINICAL DATA:  Abdominal pain.  Colostomy  back in place. EXAM: PORTABLE ABDOMEN - 1 VIEW COMPARISON:  CT dated 11/06/2023. FINDINGS: No bowel dilatation or evidence of obstruction. There is paucity of bowel air. Anastomotic staple line noted in the right lower quadrant. No free air. The osseous structures are intact. The soft tissues are unremarkable. IMPRESSION: Nonobstructive bowel gas pattern. Electronically Signed   By: Elgie Collard M.D.   On: 11/19/2023 09:59   IR Fluoro Guide CV Line Right Result Date: 11/12/2023 CLINICAL DATA:  End-stage renal disease and status post placement of non tunneled temporary dialysis catheter on 11/01/2023. The patient now requires placement of a tunneled dialysis catheter for further longer-term hemodialysis. EXAM: TUNNELED CENTRAL VENOUS HEMODIALYSIS CATHETER PLACEMENT WITH ULTRASOUND AND FLUOROSCOPIC GUIDANCE ANESTHESIA/SEDATION: Moderate (conscious) sedation was employed during this procedure. A total of Versed 1.0 mg and Fentanyl 50 mcg was administered intravenously. Moderate Sedation Time: 16 minutes. The patient's level of consciousness and vital signs were monitored continuously by radiology nursing throughout the procedure under my direct supervision. MEDICATIONS: The patient is receiving IV Rocephin every 8 hours in the hospital and did not receive additional antibiotics for the procedure. FLUOROSCOPY: 24 seconds.  2.3 mGy. PROCEDURE: The procedure, risks, benefits, and alternatives were explained to the patient. Questions regarding the procedure were encouraged and answered. The patient understands and consents to the procedure. A timeout was performed prior to initiating the procedure. A non tunneled temporary right-sided dialysis catheter was removed prior to the procedure and pressure held at the exit site. The right neck and chest were prepped with chlorhexidine in a sterile fashion, and a sterile drape was applied covering the operative field. Maximum barrier sterile technique with sterile gowns and  gloves were used for the procedure. Local anesthesia was provided with 1% lidocaine. Ultrasound was used to confirm patency of the right internal jugular vein. An ultrasound image was saved and recorded. After creating a small venotomy incision, a 21 gauge needle was advanced into the right internal jugular vein under direct, real-time ultrasound guidance. Ultrasound image documentation was performed. After securing guidewire access, an 8 Fr dilator was placed. A J-wire was kinked to measure appropriate catheter length. A Palindrome tunneled hemodialysis catheter measuring 19 cm from tip to cuff was chosen for placement. This was tunneled in a retrograde fashion from the chest wall to the venotomy incision. At the venotomy, serial dilatation was performed and a 15 Fr peel-away sheath was placed over a guidewire. The catheter was then placed through the sheath and the sheath removed. Final catheter positioning was confirmed and documented with a fluoroscopic spot image. The catheter was aspirated, flushed with saline, and injected with appropriate volume heparin dwells. The venotomy incision was closed with subcuticular 4-0 Vicryl. Dermabond was applied to the incision. The catheter exit site was secured with 0-Prolene retention sutures. COMPLICATIONS: None.  No pneumothorax. FINDINGS: After catheter placement, the tip lies in the right atrium. The catheter aspirates normally and is ready for immediate use. IMPRESSION: Placement of tunneled hemodialysis catheter via new puncture of the  right internal jugular vein after removal of the non tunneled catheter. The new catheter tip lies in the right atrium. The catheter is ready for immediate use. Electronically Signed   By: Irish Lack M.D.   On: 11/12/2023 10:45   IR US Guide Vasc Access Right Result Date: 11/12/2023 CLINICAL DATA:  End-stage renal disease and status post placement of non tunneled temporary dialysis catheter on 11/01/2023. The patient now  requires placement of a tunneled dialysis catheter for further longer-term hemodialysis. EXAM: TUNNELED CENTRAL VENOUS HEMODIALYSIS CATHETER PLACEMENT WITH ULTRASOUND AND FLUOROSCOPIC GUIDANCE ANESTHESIA/SEDATION: Moderate (conscious) sedation was employed during this procedure. A total of Versed 1.0 mg and Fentanyl 50 mcg was administered intravenously. Moderate Sedation Time: 16 minutes. The patient's level of consciousness and vital signs were monitored continuously by radiology nursing throughout the procedure under my direct supervision. MEDICATIONS: The patient is receiving IV Rocephin every 8 hours in the hospital and did not receive additional antibiotics for the procedure. FLUOROSCOPY: 24 seconds.  2.3 mGy. PROCEDURE: The procedure, risks, benefits, and alternatives were explained to the patient. Questions regarding the procedure were encouraged and answered. The patient understands and consents to the procedure. A timeout was performed prior to initiating the procedure. A non tunneled temporary right-sided dialysis catheter was removed prior to the procedure and pressure held at the exit site. The right neck and chest were prepped with chlorhexidine in a sterile fashion, and a sterile drape was applied covering the operative field. Maximum barrier sterile technique with sterile gowns and gloves were used for the procedure. Local anesthesia was provided with 1% lidocaine. Ultrasound was used to confirm patency of the right internal jugular vein. An ultrasound image was saved and recorded. After creating a small venotomy incision, a 21 gauge needle was advanced into the right internal jugular vein under direct, real-time ultrasound guidance. Ultrasound image documentation was performed. After securing guidewire access, an 8 Fr dilator was placed. A J-wire was kinked to measure appropriate catheter length. A Palindrome tunneled hemodialysis catheter measuring 19 cm from tip to cuff was chosen for placement.  This was tunneled in a retrograde fashion from the chest wall to the venotomy incision. At the venotomy, serial dilatation was performed and a 15 Fr peel-away sheath was placed over a guidewire. The catheter was then placed through the sheath and the sheath removed. Final catheter positioning was confirmed and documented with a fluoroscopic spot image. The catheter was aspirated, flushed with saline, and injected with appropriate volume heparin dwells. The venotomy incision was closed with subcuticular 4-0 Vicryl. Dermabond was applied to the incision. The catheter exit site was secured with 0-Prolene retention sutures. COMPLICATIONS: None.  No pneumothorax. FINDINGS: After catheter placement, the tip lies in the right atrium. The catheter aspirates normally and is ready for immediate use. IMPRESSION: Placement of tunneled hemodialysis catheter via new puncture of the right internal jugular vein after removal of the non tunneled catheter. The new catheter tip lies in the right atrium. The catheter is ready for immediate use. Electronically Signed   By: Irish Lack M.D.   On: 11/12/2023 10:45   IR US Guide Vasc Access Left Result Date: 11/08/2023 INDICATION: ESRD.  IV antibiotics. EXAM: ULTRASOUND AND FLUOROSCOPIC GUIDED PLACEMENT OF TUNNELED CENTRAL VENOUS "Powerline" CATHETER MEDICATIONS: The patient was on scheduled IV antibiotics. ANESTHESIA/SEDATION: Local anesthetic was administered. The patient was continuously monitored during the procedure by the interventional radiology nurse under my direct supervision. FLUOROSCOPY TIME:  Fluoroscopic dose; 3.8 mGy COMPLICATIONS: None immediate. PROCEDURE:  Informed written consent was obtained from the patient and/or patient's representative after a discussion of the risks, benefits, and alternatives to treatment. Questions regarding the procedure were encouraged and answered. The LEFT neck and chest were prepped with chlorhexidine in a sterile fashion, and a  sterile drape was applied covering the operative field. Maximum barrier sterile technique with sterile gowns and gloves were used for the procedure. A timeout was performed prior to the initiation of the procedure. After the overlying soft tissues were anesthetized, a small venotomy incision was created and a micropuncture kit was utilized to access the internal jugular vein. Real-time ultrasound guidance was utilized for vascular access including the acquisition of a permanent ultrasound image documenting patency of the accessed vessel. The microwire was utilized to measure appropriate catheter length. The micropuncture sheath was exchanged for a peel-away sheath over a guidewire. A 5 Fr dual lumen tunneled central venous catheter measuring 33 cm was tunneled in a retrograde fashion from the anterior chest wall to the venotomy incision. The catheter was then placed through the peel-away sheath with tip ultimately positioned at the superior caval-atrial junction. Final catheter positioning was confirmed and documented with a spot radiographic image. The catheter aspirates and flushes normally. The catheter was flushed with appropriate volume heparin dwells. The catheter exit site was secured with a 3-0 Ethilon retention suture. The venotomy incision was closed with Dermabond. Dressings were applied. The patient tolerated the procedure well without immediate post procedural complication. FINDINGS: After catheter placement, the tip lies within the superior apect of the right atrium. The catheter aspirates and flushes normally and is ready for immediate use. IMPRESSION: Successful placement of 33 cm dual lumen tunneled "Powerline" central venous catheter via the LEFT internal jugular vein The tip of the catheter is positioned within the proximal RIGHT atrium. The catheter is ready for immediate use. Roanna Banning, MD [facility] Radiology Electronically Signed   By: Roanna Banning M.D.   On: 11/08/2023 18:10   IR Fluoro Guide CV Line Left Result Date: 11/08/2023 INDICATION: ESRD.  IV antibiotics. EXAM: ULTRASOUND AND FLUOROSCOPIC GUIDED PLACEMENT OF TUNNELED CENTRAL VENOUS "Powerline" CATHETER MEDICATIONS: The patient was on scheduled IV antibiotics. ANESTHESIA/SEDATION: Local anesthetic was administered. The patient was continuously monitored during the procedure by the interventional radiology nurse under my direct supervision. FLUOROSCOPY TIME:  Fluoroscopic dose; 3.8 mGy COMPLICATIONS: None immediate. PROCEDURE: Informed written consent was obtained from the patient and/or patient's representative after a discussion of the risks, benefits, and alternatives to treatment. Questions regarding the procedure were encouraged and answered. The LEFT neck and chest were prepped with chlorhexidine in a sterile fashion, and a sterile drape was applied covering the operative field. Maximum barrier sterile technique with sterile gowns and gloves were used for the procedure. A timeout was performed prior to the initiation of the procedure. After the overlying soft tissues were anesthetized, a small venotomy incision was created and a micropuncture kit was utilized to access the internal jugular vein. Real-time ultrasound guidance was utilized for vascular access including the acquisition of a permanent ultrasound image documenting patency of the accessed vessel. The microwire was utilized to measure appropriate catheter length. The micropuncture sheath was exchanged for a peel-away sheath over a guidewire. A 5 Fr dual lumen tunneled central venous catheter measuring 33 cm was tunneled in a retrograde fashion from the anterior chest wall to the venotomy incision. The catheter was then placed through the peel-away sheath with tip ultimately positioned at the superior  caval-atrial junction. Final catheter positioning was confirmed and documented with a spot radiographic image. The catheter  aspirates and flushes normally. The catheter was flushed with appropriate volume heparin dwells. The catheter exit site was secured with a 3-0 Ethilon retention suture. The venotomy incision was closed with Dermabond. Dressings were applied. The patient tolerated the procedure well without immediate post procedural complication. FINDINGS: After catheter placement, the tip lies within the superior apect of the right atrium. The catheter aspirates and flushes normally and is ready for immediate use. IMPRESSION: Successful placement of 33 cm dual lumen tunneled "Powerline" central venous catheter via the LEFT internal jugular vein The tip of the catheter is positioned within the proximal RIGHT atrium. The catheter is ready for immediate use. Roanna Banning, MD [facility] Radiology Electronically Signed   By: Roanna Banning M.D.   On: 11/08/2023 18:10   VAS [place] LOWER EXTREMITY VENOUS (DVT) Result Date: 11/07/2023  Lower Venous DVT Study Patient Name:  SARAY CAPASSO [facility]  Date of Exam:   11/07/2023 Medical Rec #: [id number]          Accession #:    [id number] Date of Birth: 11-24-1955          Patient Gender: F Patient Age:   31 years Exam Location:  [facility] Procedure:      VAS [place] LOWER EXTREMITY VENOUS (DVT) Referring Phys: Tresa Endo OSBORNE --------------------------------------------------------------------------------  Indications: Pain, stroke, and A-fib, anemia.  Comparison Study: No prior exam. Performing Technologist: Fernande Bras  Examination Guidelines: A complete evaluation includes B-mode imaging, spectral Doppler, color Doppler, and power Doppler as needed of all accessible portions of each vessel. Bilateral testing is considered an integral part of a complete examination. Limited examinations for reoccurring indications may be performed as noted. The reflux portion of the exam is performed with the patient in reverse Trendelenburg.   +---------+---------------+---------+-----------+----------+--------------+ RIGHT    CompressibilityPhasicitySpontaneityPropertiesThrombus Aging +---------+---------------+---------+-----------+----------+--------------+ CFV      Full           Yes      Yes                                 +---------+---------------+---------+-----------+----------+--------------+ SFJ      Full           Yes      Yes                                 +---------+---------------+---------+-----------+----------+--------------+ FV Prox  Full                                                        +---------+---------------+---------+-----------+----------+--------------+ FV Mid   Full                                                        +---------+---------------+---------+-----------+----------+--------------+ FV DistalFull                                                        +---------+---------------+---------+-----------+----------+--------------+  PFV      Full                                                        +---------+---------------+---------+-----------+----------+--------------+ POP      Full           Yes      Yes                                 +---------+---------------+---------+-----------+----------+--------------+ PTV      Full                                                        +---------+---------------+---------+-----------+----------+--------------+ PERO     Full                                                        +---------+---------------+---------+-----------+----------+--------------+   +---------+---------------+---------+-----------+----------+--------------+ LEFT     CompressibilityPhasicitySpontaneityPropertiesThrombus Aging +---------+---------------+---------+-----------+----------+--------------+ CFV      Full           Yes      Yes                                  +---------+---------------+---------+-----------+----------+--------------+ SFJ      Full           Yes      Yes                                 +---------+---------------+---------+-----------+----------+--------------+ FV Prox  Full                                                        +---------+---------------+---------+-----------+----------+--------------+ FV Mid   Full                                                        +---------+---------------+---------+-----------+----------+--------------+ FV DistalFull                                                        +---------+---------------+---------+-----------+----------+--------------+ PFV      Full                                                        +---------+---------------+---------+-----------+----------+--------------+  POP      Full           Yes      Yes                                 +---------+---------------+---------+-----------+----------+--------------+ PTV      Full                                                        +---------+---------------+---------+-----------+----------+--------------+ PERO     Full                                                        +---------+---------------+---------+-----------+----------+--------------+     Summary: BILATERAL: - No evidence of deep vein thrombosis seen in the lower extremities, bilaterally. -No evidence of popliteal cyst, bilaterally.   *See table(s) above for measurements and observations. Electronically signed by Heath Lark on 11/07/2023 at 4:05:14 PM.    Final    CT ABDOMEN PELVIS WO CONTRAST Result Date: 11/06/2023 CLINICAL DATA:  Diverticulitis, complication suspected EXAM: CT ABDOMEN AND PELVIS WITHOUT CONTRAST TECHNIQUE: Multidetector CT imaging of the abdomen and pelvis was performed following the standard protocol without IV contrast. RADIATION DOSE REDUCTION: This exam was performed according to the departmental  dose-optimization program which includes automated exposure control, adjustment of the mA and/or kV according to patient size and/or use of iterative reconstruction technique. COMPARISON:  10/31/2023 FINDINGS: Lower chest: Volume loss and opacity in the lower lungs consistent with atelectasis. There is a central line with tip at the upper right atrium. Coronary atherosclerosis. Hepatobiliary: No focal liver abnormality. Calcific in the intermediate density in the gallbladder attributed to stones. No biliary dilatation or pericholecystic edema. Pancreas: Unremarkable. Spleen: Unremarkable. Adrenals/Urinary Tract: Negative adrenals. No hydronephrosis or stone. Renal cortical thinning and lobulation at the left upper pole. 3 mm stone at the upper pole left kidney. The urinary bladder is collapsed around a Foley catheter. Stomach/Bowel: Descending colostomy. Bowel anastomosis in the right abdomen. No evidence of bowel obstruction. Fluid collection with loculation appearance/peritoneal thickening along the left flank measuring up to 5 x 6 x 10 cm. Milder fluid accumulation in the ventral peritoneal space. Vascular/Lymphatic: Extensive atheromatous calcification of the aorta and iliacs. No mass or adenopathy. Reproductive:No pathologic findings. Other: No ascites or pneumoperitoneum. Musculoskeletal: No acute abnormalities.  T9 butterfly vertebra IMPRESSION: 1. Interval colostomy with fluid collection along the left gutter, dominant pocket measuring 10 x 6 x 5 cm. 2. No bowel obstruction. 3. Cholelithiasis and left nephrolithiasis. Electronically Signed   By: Tiburcio Pea M.D.   On: 11/06/2023 07:12   DG Chest Port 1 View Result Date: 11/03/2023 CLINICAL DATA:  Shortness of breath EXAM: PORTABLE CHEST - 1 VIEW COMPARISON:  11/11/2018 FINDINGS: Unchanged mild cardiomegaly and pulmonary vascular congestion. Interval worsening of left basilar opacity likely due to atelectasis. Lungs otherwise clear. Examination is  limited due to expiratory phase of imaging. Interval placement of temporary right IJ hemodialysis catheter which terminates in the region of the right atrium. Nasogastric tube terminates in the left upper quadrant. IMPRESSION: Interval worsening of left basilar  opacity which is likely due to atelectasis. Electronically Signed   By: Acquanetta Belling M.D.   On: 11/03/2023 07:22   IR Fluoro Guide CV Line Right Result Date: 11/01/2023 INDICATION: ESRD requiring HD. EXAM: NON-TUNNELED CENTRAL VENOUS HEMODIALYSIS CATHETER PLACEMENT WITH ULTRASOUND AND FLUOROSCOPIC GUIDANCE COMPARISON:  CT AP 10/31/2023. MEDICATIONS: Local anesthetic was administered. FLUOROSCOPY TIME:  Fluoroscopic dose; 35 mGy COMPLICATIONS: None immediate. PROCEDURE: Informed written consent was obtained from the patient and/or patient's representative after a discussion of the risks, benefits, and alternatives to treatment. Questions regarding the procedure were encouraged and answered. The RIGHT neck and chest were prepped with chlorhexidine in a sterile fashion, and a sterile drape was applied covering the operative field. Maximum barrier sterile technique with sterile gowns and gloves were used for the procedure. A timeout was performed prior to the initiation of the procedure. After the overlying soft tissues were anesthetized, a small venotomy incision was created and a micropuncture kit was utilized to access the external jugular vein. Real-time ultrasound guidance was utilized for vascular access including the acquisition of a permanent ultrasound image documenting patency of the accessed vessel. The microwire was utilized to measure appropriate catheter length. A stiff glidewire was advanced to the level of the IVC. Under fluoroscopic guidance, the venotomy was serially dilated, ultimately allowing placement of a 20 cm temporary Trialysis catheter with tip ultimately terminating within the superior aspect of the right atrium. Final catheter  positioning was confirmed and documented with a spot radiographic image. The catheter aspirates and flushes normally. The catheter was flushed with appropriate volume heparin dwells. The catheter exit site was secured with a 2-0 Ethilon retention suture. A dressing was placed. The patient tolerated the procedure well without immediate post procedural complication. IMPRESSION: Successful placement of a RIGHT EXTERNAL jugular approach 20 cm non-tunneled dialysis catheter The tip of the catheter is positioned within the proximal RIGHT atrium. The catheter is ready for immediate use. PLAN: This catheter may be converted to a tunneled dialysis catheter at a later date as indicated. Roanna Banning, MD [facility] Electronically Signed   By: Roanna Banning M.D.   On: 11/01/2023 18:15   IR US Guide Vasc Access Right Result Date: 11/01/2023 INDICATION: ESRD requiring HD. EXAM: NON-TUNNELED CENTRAL VENOUS HEMODIALYSIS CATHETER PLACEMENT WITH ULTRASOUND AND FLUOROSCOPIC GUIDANCE COMPARISON:  CT AP 10/31/2023. MEDICATIONS: Local anesthetic was administered. FLUOROSCOPY TIME:  Fluoroscopic dose; 35 mGy COMPLICATIONS: None immediate. PROCEDURE: Informed written consent was obtained from the patient and/or patient's representative after a discussion of the risks, benefits, and alternatives to treatment. Questions regarding the procedure were encouraged and answered. The RIGHT neck and chest were prepped with chlorhexidine in a sterile fashion, and a sterile drape was applied covering the operative field. Maximum barrier sterile technique with sterile gowns and gloves were used for the procedure. A timeout was performed prior to the initiation of the procedure. After the overlying soft tissues were anesthetized, a small venotomy incision was created and a micropuncture kit was utilized to access the external jugular vein. Real-time ultrasound guidance was utilized for vascular  access including the acquisition of a permanent ultrasound image documenting patency of the accessed vessel. The microwire was utilized to measure appropriate catheter length. A stiff glidewire was advanced to the level of the IVC. Under fluoroscopic guidance, the venotomy was serially dilated, ultimately allowing placement of a 20 cm temporary Trialysis catheter with tip ultimately terminating within the superior aspect of the right atrium. Final catheter positioning  was confirmed and documented with a spot radiographic image. The catheter aspirates and flushes normally. The catheter was flushed with appropriate volume heparin dwells. The catheter exit site was secured with a 2-0 Ethilon retention suture. A dressing was placed. The patient tolerated the procedure well without immediate post procedural complication. IMPRESSION: Successful placement of a RIGHT EXTERNAL jugular approach 20 cm non-tunneled dialysis catheter The tip of the catheter is positioned within the proximal RIGHT atrium. The catheter is ready for immediate use. PLAN: This catheter may be converted to a tunneled dialysis catheter at a later date as indicated. Roanna Banning, MD [facility] Electronically Signed   By: Roanna Banning M.D.   On: 11/01/2023 18:15   CT ABDOMEN PELVIS WO CONTRAST Result Date: 10/31/2023 CLINICAL DATA:  Diverticulitis, complication suspected. EXAM: CT ABDOMEN AND PELVIS WITHOUT CONTRAST TECHNIQUE: Multidetector CT imaging of the abdomen and pelvis was performed following the standard protocol without IV contrast. RADIATION DOSE REDUCTION: This exam was performed according to the departmental dose-optimization program which includes automated exposure control, adjustment of the mA and/or kV according to patient size and/or use of iterative reconstruction technique. COMPARISON:  10/27/2023. FINDINGS: Lower chest: Heart is enlarged and multi-vessel coronary artery  calcifications are noted. Consolidation and patchy airspace disease is present at the lung bases bilaterally. Hepatobiliary: No focal liver abnormality is seen. Stones are present within the gallbladder. No biliary ductal dilatation. Pancreas: Unremarkable. No pancreatic ductal dilatation or surrounding inflammatory changes. Spleen: Normal in size without focal abnormality. Adrenals/Urinary Tract: The adrenal glands are within normal limits. Calcifications are present in the upper pole of the left kidney. There is a cyst in the lower pole of the right kidney. No hydronephrosis bilaterally. The bladder is unremarkable. Stomach/Bowel: The stomach is within normal limits. No bowel obstruction or pneumatosis is seen. Right hemicolectomy changes are noted. There is diffuse colonic wall thickening. Scattered diverticula are present along the colon. A few small foci of free air are noted in the mesentery in the mid left abdomen, slightly increased from the previous exam. There is suggestion of contrast extravasation in the region. There is fine no definite abscess is seen. Vascular/Lymphatic: Aortic atherosclerosis. No enlarged abdominal or pelvic lymph nodes. Reproductive: No acute abnormality. Other: Mild ascites is noted in all 4 quadrants. A fat containing umbilical hernia is noted. There is mild anasarca. Musculoskeletal: The bony structures are stable. IMPRESSION: 1. Worsening diverticulitis of the descending colon with perforation and increasing free air and suspected oral contrast extravasation in the mid left abdomen. No definite abscess is seen. Surgical consultation is recommended. 2. Mild ascites. 3. Diffuse colonic wall thickening which may be due to associated inflammatory changes. 4. Patchy airspace disease at the lung bases with bilateral lower lobe consolidation. 5. Cholelithiasis. 6. Aortic atherosclerosis. Critical Value/emergent results were called by telephone at the time of interpretation on 10/31/2023  at 11:59 pm to provider Dr. Antionette Char, who verbally acknowledged these results. Electronically Signed   By: Thornell Sartorius M.D.   On: 10/31/2023 23:59   CT ABDOMEN PELVIS WO CONTRAST Result Date: 10/27/2023 CLINICAL DATA:  Acute nonlocalized abdominal pain. Chills and fever. Vomiting and diarrhea. EXAM: CT ABDOMEN AND PELVIS WITHOUT CONTRAST TECHNIQUE: Multidetector CT imaging of the abdomen and pelvis was performed following the standard protocol without IV contrast. RADIATION DOSE REDUCTION: This exam was performed according to the departmental dose-optimization program which includes automated exposure control, adjustment of the mA and/or kV according to patient size and/or use  of iterative reconstruction technique. COMPARISON:  CT abdomen and pelvis 04/19/2021 FINDINGS: Lower chest: Bibasilar atelectasis.  No acute abnormality. Hepatobiliary: Cholelithiasis without evidence of acute cholecystitis. No biliary dilation. Unremarkable noncontrast appearance of the liver. Pancreas: Unremarkable. Spleen: Unremarkable. Adrenals/Urinary Tract: Normal adrenal glands. No urinary calculi or hydronephrosis. Unremarkable bladder. Stomach/Bowel: Stomach is within normal limits. No bowel obstruction. Colonic diverticulosis. Wall thickening, inflammatory stranding, and free fluid about the descending colon. Loosely organized fluid and gas about the inflamed descending colon (circa series 3/image 49-50) compatible with contained perforation. The adjacent jejunum is inflamed. Postoperative change about the colon with ileocolonic anastomosis in the right upper quadrant. Vascular/Lymphatic: Aortic atherosclerosis. No enlarged abdominal or pelvic lymph nodes. Reproductive: No acute abnormality. Other: Fat containing periumbilical hernia. Musculoskeletal: No acute fracture. IMPRESSION: 1. Acute diverticulitis of the descending colon with contained perforation. 2. Inflamed jejunum adjacent to the perforated diverticulitis. 3.  Aortic  Atherosclerosis (ICD10-I70.0). Critical Value/emergent results were called by telephone at the time of interpretation on 10/27/2023 at 9:22 pm to provider [facility] , who verbally acknowledged these results. Electronically Signed   By: Minerva Fester M.D.   On: 10/27/2023 21:24    Signature  -   Jeoffrey Massed M.D on 11/20/2023 at 10:43 AM   -  To page go to [email or web address]

## 2023-11-20 NOTE — Progress Notes (Signed)
 Patient ID: [name], female   DOB: 15-Jun-1956, 68 y.o.   MRN: [id number] [facility] Progress Note   Assessment/ Plan:   1.  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  After initial refusal of wanting to pursue renal replacement therapy, she changed her mind and asked to start dialysis with the intention of undergoing surgery for contained perforation of acute diverticulitis.  -TDC w/ IR on 2/18 -Maintain dialysis on MWF schedule -Palliative care has been involved and patient wishes to continue with dialysis for now.   -CLIP in process; likely [facility], renal navigator following.  Probably going to CIR--pending -Would hold on permanent access creation for now given overall clinical picture, will likely need to revisit as an outpatient 2.  Acute diverticulitis with contained perforation of descending colon: On Zosyn and now status post exploratory laparotomy with Hartman's resection for perforated diverticulitis/peritonitis (2/8).  Management per surgery and primary team 3.  Anion gap metabolic acidosis: Resolved with renal replacement therapy 4.  Anemia: Has received blood and IV iron. ESA also ordered, increased to qmonday 5. HTN: BP acceptable/stable, uf as tolerated 6. Dispo: eval for CIR.  Subjective:   Patient seen and examined bedside. Saw her at the end of her treatment. She reports that she did much better today with HD as compared to Monday. She reports that her abdomen feels 'raw' and hurts after HD but denies any cramping. No other complaints.   Objective:   BP (!) 167/62   Pulse 75   Temp 98.3 F (36.8 C) (Oral)   Resp (!) 28   Ht 5' 7.01" (1.702 m)   Wt 88.4 kg   SpO2 100%   BMI 30.52 kg/m   Intake/Output Summary (Last 24 hours) at 11/20/2023 1227 Last data filed at 11/20/2023 1211 Gross per 24  hour  Intake 240 ml  Output 1 ml  Net 239 ml   Weight change:   Physical Exam: Gen: Lying in bed in no distress CVS: Normal rate, no rub Resp: Bilateral chest rise with no increased work of breathing, cta bl Abd: Minimal distention, colostomy bag in place Ext: no sig edema b/l LEs Neuro: awake, alert Dialysis access: right [facility] c/d/i  Imaging: DG Abd Portable 1V Result Date: 11/19/2023 CLINICAL DATA:  Abdominal pain.  Colostomy back in place. EXAM: PORTABLE ABDOMEN - 1 VIEW COMPARISON:  CT dated 11/06/2023. FINDINGS: No bowel dilatation or evidence of obstruction. There is paucity of bowel air. Anastomotic staple line noted in the right lower quadrant. No free air. The osseous structures are intact. The soft tissues are unremarkable. IMPRESSION: Nonobstructive bowel gas pattern. Electronically Signed   By: Elgie Collard M.D.   On: 11/19/2023 09:59       Labs: BMET Recent Labs  Lab 11/14/23 0354 11/15/23 0806 11/18/23 0551 11/19/23 0834 11/20/23 0820  NA 135 136 135 134* 133*  K 3.7 3.5 3.5 3.5 3.7  CL 97* 99 100 97* 96*  CO2 26 24 22 26 24   GLUCOSE 152* 96 118* 88 164*  BUN 25* 30* 30* 17 24*  CREATININE 5.39* 7.20* 8.20* 6.15* 7.84*  CALCIUM 7.7* 7.8* 7.9* 8.2* 8.0*  PHOS  --  4.9* 4.8* 4.3 5.6*   CBC Recent Labs  Lab 11/15/23 0806 11/18/23 0551 11/19/23 0834 11/20/23 0820  WBC 13.5* 10.7* 10.2 10.3  HGB 8.6* 9.1* 10.1* 9.3*  HCT 26.7*  28.8* 31.1* 29.8*  MCV 84.2 85.0 84.1 85.6  PLT 325 338 227 232    Medications:     amLODipine  5 mg Oral Daily   apixaban  5 mg Oral BID   carvedilol  6.25 mg Oral BID WC   Chlorhexidine Gluconate Cloth  6 each Topical Q0600   darbepoetin (ARANESP) injection - DIALYSIS  60 mcg Subcutaneous Q Mon-1800   docusate sodium  100 mg Oral BID   insulin aspart  0-15 Units Subcutaneous TID WC   insulin aspart  0-5 Units Subcutaneous QHS   insulin glargine  12 Units Subcutaneous Q24H   latanoprost  1 drop Both Eyes QHS    mouth rinse  15 mL Mouth Rinse 4 times per day   pantoprazole  40 mg Oral BID   polyethylene glycol  17 g Oral Daily   senna  1 tablet Oral Daily   sodium chloride flush  10-40 mL Intracatheter Q12H   Kirsi Hugh  11/20/2023, 12:27 PM

## 2023-11-20 NOTE — Progress Notes (Signed)
 Advised by CSW that pt will be going to snf at d/c. Insurance auth for snf is pending. Contacted [facility] to inquire if pt can start on Friday if pt is d/c to snf today or tomorrow. Pt can start on Friday. Schedule will be MWF 11:35 am chair time. For first appt, pt will need to arrive at 10:45 am to complete paperwork prior to treatment. HD info provided to CSW for d/c planning purposes. Will attempt to meet with pt later today to provide info as well. Arrangements added to AVS as well. Contacted renal NP to request that orders be sent to clinic. Will assist as needed.   Olivia Canter Renal Navigator [phone]

## 2023-11-20 NOTE — Plan of Care (Signed)
  Problem: Education: Goal: Ability to describe self-care measures that may prevent or decrease complications (Diabetes Survival Skills Education) will improve Outcome: Progressing Goal: Individualized Educational Video(s) Outcome: Progressing   Problem: Coping: Goal: Ability to adjust to condition or change in health will improve Outcome: Progressing   Problem: Fluid Volume: Goal: Ability to maintain a balanced intake and output will improve Outcome: Progressing   Problem: Health Behavior/Discharge Planning: Goal: Ability to identify and utilize available resources and services will improve Outcome: Progressing Goal: Ability to manage health-related needs will improve Outcome: Progressing   Problem: Metabolic: Goal: Ability to maintain appropriate glucose levels will improve Outcome: Progressing   Problem: Nutritional: Goal: Maintenance of adequate nutrition will improve Outcome: Progressing Goal: Progress toward achieving an optimal weight will improve Outcome: Progressing   Problem: Skin Integrity: Goal: Risk for impaired skin integrity will decrease Outcome: Progressing   Problem: Tissue Perfusion: Goal: Adequacy of tissue perfusion will improve Outcome: Progressing   Problem: Education: Goal: Knowledge of General Education information will improve Description: Including pain rating scale, medication(s)/side effects and non-pharmacologic comfort measures Outcome: Progressing   Problem: Health Behavior/Discharge Planning: Goal: Ability to manage health-related needs will improve Outcome: Progressing   Problem: Clinical Measurements: Goal: Ability to maintain clinical measurements within normal limits will improve Outcome: Progressing Goal: Will remain free from infection Outcome: Progressing Goal: Diagnostic test results will improve Outcome: Progressing Goal: Respiratory complications will improve Outcome: Progressing Goal: Cardiovascular complication will  be avoided Outcome: Progressing   Problem: Activity: Goal: Risk for activity intolerance will decrease Outcome: Progressing   Problem: Nutrition: Goal: Adequate nutrition will be maintained Outcome: Progressing   Problem: Coping: Goal: Level of anxiety will decrease Outcome: Progressing   Problem: Elimination: Goal: Will not experience complications related to bowel motility Outcome: Progressing Goal: Will not experience complications related to urinary retention Outcome: Progressing   Problem: Pain Managment: Goal: General experience of comfort will improve and/or be controlled Outcome: Progressing   Problem: Safety: Goal: Ability to remain free from injury will improve Outcome: Progressing   Problem: Skin Integrity: Goal: Risk for impaired skin integrity will decrease Outcome: Progressing   Problem: Education: Goal: Knowledge of the prescribed therapeutic regimen will improve Outcome: Progressing   Problem: Coping: Goal: Ability to identify and develop effective coping behavior will improve Outcome: Progressing   Problem: Clinical Measurements: Goal: Quality of life will improve Outcome: Progressing   Problem: Respiratory: Goal: Verbalizations of increased ease of respirations will increase Outcome: Progressing   Problem: Role Relationship: Goal: Family's ability to cope with current situation will improve Outcome: Progressing Goal: Ability to verbalize concerns, feelings, and thoughts to partner or family member will improve Outcome: Progressing   Problem: Pain Management: Goal: Satisfaction with pain management regimen will improve Outcome: Progressing   Problem: Education: Goal: Knowledge of disease and its progression will improve Outcome: Progressing Goal: Individualized Educational Video(s) Outcome: Progressing   Problem: Fluid Volume: Goal: Compliance with measures to maintain balanced fluid volume will improve Outcome: Progressing    Problem: Health Behavior/Discharge Planning: Goal: Ability to manage health-related needs will improve Outcome: Progressing   Problem: Nutritional: Goal: Ability to make healthy dietary choices will improve Outcome: Progressing   Problem: Clinical Measurements: Goal: Complications related to the disease process, condition or treatment will be avoided or minimized Outcome: Progressing

## 2023-11-20 NOTE — TOC Progression Note (Addendum)
 Transition of Care [facility]) - Progression Note    Patient Details  Name: [name] MRN: [id number] Date of Birth: [birth year]  [facility] of [facility]) CM/SW Contact  Mearl Latin, LCSW Phone Number: 11/20/2023, 10:58 AM  Clinical Narrative:    11am-Insurance approval pending for [facility].   4:30 PM-[facility] stated that insurance has denied patient for SNF and did not provide option of appealing. CSW contacting insurance ([employer]) to see options.   Expected Discharge Plan: Skilled Nursing Facility Barriers to Discharge: Continued Medical Work up, English as a second language teacher  Expected Discharge Plan and Services In-house Referral: Clinical Social Work   Post Acute Care Choice: IP Rehab Living arrangements for the past 2 months: Single Family Home                                       Social Determinants of Health (SDOH) Interventions SDOH Screenings   Food Insecurity: No Food Insecurity (10/28/2023)  Housing: Low Risk  (10/28/2023)  Transportation Needs: No Transportation Needs (10/28/2023)  Utilities: Not At Risk (10/28/2023)  Depression (PHQ2-9): Low Risk  (02/28/2021)  Financial Resource Strain: Low Risk  (08/25/2021)   Received from [facility], [facility]  Physical Activity: Insufficiently Active (06/20/2021)   Received from [facility], [facility]  Social Connections: Socially Isolated (10/28/2023)  Stress: No Stress Concern Present (08/25/2021)   Received from [facility], [facility]  Tobacco Use: Low Risk  (11/02/2023)    Readmission Risk Interventions     No data to display

## 2023-11-21 DIAGNOSIS — K5732 Diverticulitis of large intestine without perforation or abscess without bleeding: Secondary | ICD-10-CM | POA: Diagnosis not present

## 2023-11-21 DIAGNOSIS — N185 Chronic kidney disease, stage 5: Secondary | ICD-10-CM | POA: Diagnosis not present

## 2023-11-21 DIAGNOSIS — R4589 Other symptoms and signs involving emotional state: Secondary | ICD-10-CM

## 2023-11-21 DIAGNOSIS — D649 Anemia, unspecified: Secondary | ICD-10-CM | POA: Diagnosis not present

## 2023-11-21 DIAGNOSIS — N179 Acute kidney failure, unspecified: Secondary | ICD-10-CM | POA: Diagnosis not present

## 2023-11-21 DIAGNOSIS — Z515 Encounter for palliative care: Secondary | ICD-10-CM | POA: Diagnosis not present

## 2023-11-21 DIAGNOSIS — K5780 Diverticulitis of intestine, part unspecified, with perforation and abscess without bleeding: Secondary | ICD-10-CM | POA: Diagnosis not present

## 2023-11-21 LAB — GLUCOSE, CAPILLARY
Glucose-Capillary: 118 mg/dL — ABNORMAL HIGH (ref 70–99)
Glucose-Capillary: 219 mg/dL — ABNORMAL HIGH (ref 70–99)
Glucose-Capillary: 166 mg/dL — ABNORMAL HIGH (ref 70–99)
Glucose-Capillary: 170 mg/dL — ABNORMAL HIGH (ref 70–99)

## 2023-11-21 NOTE — Care Management Important Message (Signed)
 Important Message  Patient Details  Name: [name] MRN: [id number] Date of Birth: July 20, 1956   Important Message Given:  Yes - Medicare IM     Sherilyn Banker 11/21/2023, 12:37 PM

## 2023-11-21 NOTE — Progress Notes (Signed)
 PROGRESS NOTE        PATIENT DETAILS Name: [name] Age: 68 y.o. Sex: female Date of Birth: [birth year] Admit Date: 10/27/2023 Admitting Physician Dolly Rias, MD ZOX:[id number], Devonne Doughty, NP  Brief Summary: Patient is a 68 y.o.  female with history of CKD 5, A-fib on Eliquis, CAD-who presented with abdominal pain-found to have acute diverticulitis with contained perforation involving the descending colon.  Hospital course complicated by worsening AKI-thought to have progressed to ESRD-requiring initiation of HD.  Unfortunately-even with conservative measures/antibiotics-she continued to worsen and ended up with exploratory laparotomy/Hartman's procedure on 2/8.  Significant events: 2/2>> admit to [facility] 2/6>> CT abdomen worsening diverticulitis with perforation 2/7>> transitioned to full comfort measures-as with worsening AKI-however multiple family members arrived-comfort care revoked-now full code-full scope of treatment including HD/laparotomy with colostomy.  General surgery/nephrology reconsulted.  IR placed [facility] and patient underwent first hemodialysis.  2/8>> remains n.p.o-colectomy/colostomy later today. 2/20>> wound VAC reapplied  Significant studies: 2/2>> CT abdomen/pelvis: Acute diverticulitis-descending colon with contained perforation 2/6>> CT abdomen/pelvis: Worsening diverticulitis of descending colon with perforation and increasing failure-suspected oral contrast extravasation left abdomen.  Significant microbiology data: 2/2>> COVID/influenza/RSV PCR: Negative  Procedures: 2/8>> by Dr. Freida Busman underwent exploratory laparotomy with Hartman's resection for perforated diverticulitis with diffuse peritonitis, colostomy in place. 2/13 - R IJ Trialysis catheter - IR 2/14 -left IJ Central line placed by IR for TNA(removed on 2/22) 11/12/2023. R.internal jugular tunneled HD catheter placement by  IR  Consults: Surgery Renal Palliative IR  Subjective:  No complaints-lying comfortably in bed.  Awaiting SNF bed.  Objective: Vitals: Blood pressure 107/63, pulse 75, temperature 98.8 F (37.1 C), temperature source Oral, resp. rate 17, height 5' 7.01" (1.702 m), weight 91 kg, SpO2 96%.   Exam: Gen Exam:Alert awake-not in any distress HEENT:atraumatic, normocephalic Chest: B/L clear to auscultation anteriorly CVS:S1S2 regular Abdomen:soft non tender, non distended-wound VAC in place. Extremities:no edema Neurology: Non focal Skin: no rash  Assessment/Plan: Acute diverticulitis with contained perforation of the descending colon  S/p Hartman's procedure on 2/8-completed a course of antibiotics-off PNA-appetite improving.  Wound VAC reapplied 2/20-General Surgery following and directing postop care.  AKI on CKD 5 with progression to ESRD AKI likely hemodynamically mediated-has now likely progressed to ESRD.   HD initiated via right IJ temporary catheter, switched to right IJ trialysis catheter by IR on 11/07/2023>> tunneled HD catheter right IJ on 11/12/2023 by IR. CLIP in process, renal would like to hold on permanent access creation for now given her overall clinical picture.  CAD Currently no anginal symptoms Suspect not on antiplatelets as on anticoagulation with Eliquis/heparin Continue beta-blocker On Repatha injections as an outpatient.  PAF Sinus rhythm Continue Coreg Currently on Eliquis  Anemia of acute illness  Anemia of end-stage renal disease Multifactorial anemia-did 3 units of PRBC-last transfused on 2/19-Hb currently stable. Iron/Aranesp per nephrology service.   Hb currently stable-follow CBC closely.  HTN BP stable Continue Norvasc/Coreg  DM-2 (A1c 6.6 on 2/3) CBGs stable Semglee 12 units daily+ SSI.   Recent Labs    11/20/23 1844 11/20/23 2119 11/21/23 0736  GLUCAP 144* 131* 118*    Palliative care/goals of care Palliative input  greatly appreciated Note-initially did not want to pursue surgery/hemodialysis-comfort measures was contemplated-comfort measures was revoked-patient was a full code-but after continued discussion-she is now a DNR.  Class 1 Obesity: Estimated body mass  index is 31.41 kg/m as calculated from the following:   Height as of this encounter: 5' 7.01" (1.702 m).   Weight as of this encounter: 91 kg.    Code status:   Code Status: Limited: Do not attempt resuscitation (DNR) -DNR-LIMITED -Do Not Intubate/DNI    DVT Prophylaxis: Place and maintain sequential compression device Start: 11/06/23 0613 apixaban (ELIQUIS) tablet 5 mg IV Heparin  Family Communication: Discussed with patient, all her questions answered.  Disposition Plan: Status is: Inpatient-due to severity of illness  Disposition: SNF-awaiting placement.   Diet: Diet Order             Diet regular Room service appropriate? Yes; Fluid consistency: Thin  Diet effective now                   MEDICATIONS: Scheduled Meds:  amLODipine  5 mg Oral Daily   apixaban  5 mg Oral BID   brimonidine  1 drop Both Eyes BID   carvedilol  6.25 mg Oral BID WC   Chlorhexidine Gluconate Cloth  6 each Topical Q0600   [START ON 11/25/2023] darbepoetin (ARANESP) injection - DIALYSIS  100 mcg Subcutaneous Q Mon-1800   docusate sodium  100 mg Oral BID   insulin aspart  0-15 Units Subcutaneous TID WC   insulin aspart  0-5 Units Subcutaneous QHS   insulin glargine  12 Units Subcutaneous Q24H   latanoprost  1 drop Both Eyes QHS   mouth rinse  15 mL Mouth Rinse 4 times per day   pantoprazole  40 mg Oral BID   polyethylene glycol  17 g Oral BID   senna  1 tablet Oral Daily   sodium chloride flush  10-40 mL Intracatheter Q12H   Continuous Infusions:   PRN Meds:.acetaminophen **OR** acetaminophen, albuterol, hydrALAZINE, HYDROmorphone (DILAUDID) injection, melatonin, menthol-cetylpyridinium, ondansetron (ZOFRAN) IV, mouth rinse,  oxyCODONE   I have personally reviewed following labs and imaging studies  LABORATORY DATA:   Data Review:    Recent Labs  Lab 11/15/23 0806 11/18/23 0551 11/19/23 0834 11/20/23 0820  WBC 13.5* 10.7* 10.2 10.3  HGB 8.6* 9.1* 10.1* 9.3*  HCT 26.7* 28.8* 31.1* 29.8*  PLT 325 338 227 232  MCV 84.2 85.0 84.1 85.6  MCH 27.1 26.8 27.3 26.7  MCHC 32.2 31.6 32.5 31.2  RDW 16.3* 16.9* 16.8* 16.6*    Recent Labs  Lab 11/15/23 0806 11/18/23 0551 11/19/23 0834 11/20/23 0820  NA 136 135 134* 133*  K 3.5 3.5 3.5 3.7  CL 99 100 97* 96*  CO2 24 22 26 24   ANIONGAP 13 13 11 13   GLUCOSE 96 118* 88 164*  BUN 30* 30* 17 24*  CREATININE 7.20* 8.20* 6.15* 7.84*  ALBUMIN 1.9* 2.1* 2.2* 2.2*  PHOS 4.9* 4.8* 4.3 5.6*  CALCIUM 7.8* 7.9* 8.2* 8.0*      Recent Labs  Lab 11/15/23 0806 11/18/23 0551 11/19/23 0834 11/20/23 0820  CALCIUM 7.8* 7.9* 8.2* 8.0*    --------------------------------------------------------------------------------------------------------------- Lab Results  Component Value Date   CHOL 134 12/21/2019   HDL 49 12/21/2019   LDLCALC 69 12/21/2019   TRIG 148 11/11/2023   CHOLHDL 2.7 12/21/2019    Lab Results  Component Value Date   HGBA1C 6.6 (H) 10/28/2023      Micro Results No results found for this or any previous visit (from the past 240 hours).    Radiology Reports DG Abd Portable 1V Result Date: 11/19/2023 CLINICAL DATA:  Abdominal pain.  Colostomy back in place. EXAM:  PORTABLE ABDOMEN - 1 VIEW COMPARISON:  CT dated 11/06/2023. FINDINGS: No bowel dilatation or evidence of obstruction. There is paucity of bowel air. Anastomotic staple line noted in the right lower quadrant. No free air. The osseous structures are intact. The soft tissues are unremarkable. IMPRESSION: Nonobstructive bowel gas pattern. Electronically Signed   By: Elgie Collard M.D.   On: 11/19/2023 09:59   IR Fluoro Guide CV Line Right Result Date: 11/12/2023 CLINICAL DATA:   End-stage renal disease and status post placement of non tunneled temporary dialysis catheter on 11/01/2023. The patient now requires placement of a tunneled dialysis catheter for further longer-term hemodialysis. EXAM: TUNNELED CENTRAL VENOUS HEMODIALYSIS CATHETER PLACEMENT WITH ULTRASOUND AND FLUOROSCOPIC GUIDANCE ANESTHESIA/SEDATION: Moderate (conscious) sedation was employed during this procedure. A total of Versed 1.0 mg and Fentanyl 50 mcg was administered intravenously. Moderate Sedation Time: 16 minutes. The patient's level of consciousness and vital signs were monitored continuously by radiology nursing throughout the procedure under my direct supervision. MEDICATIONS: The patient is receiving IV Rocephin every 8 hours in the hospital and did not receive additional antibiotics for the procedure. FLUOROSCOPY: 24 seconds.  2.3 mGy. PROCEDURE: The procedure, risks, benefits, and alternatives were explained to the patient. Questions regarding the procedure were encouraged and answered. The patient understands and consents to the procedure. A timeout was performed prior to initiating the procedure. A non tunneled temporary right-sided dialysis catheter was removed prior to the procedure and pressure held at the exit site. The right neck and chest were prepped with chlorhexidine in a sterile fashion, and a sterile drape was applied covering the operative field. Maximum barrier sterile technique with sterile gowns and gloves were used for the procedure. Local anesthesia was provided with 1% lidocaine. Ultrasound was used to confirm patency of the right internal jugular vein. An ultrasound image was saved and recorded. After creating a small venotomy incision, a 21 gauge needle was advanced into the right internal jugular vein under direct, real-time ultrasound guidance. Ultrasound image documentation was performed. After securing guidewire access, an 8 Fr dilator was placed. A J-wire was kinked to measure  appropriate catheter length. A Palindrome tunneled hemodialysis catheter measuring 19 cm from tip to cuff was chosen for placement. This was tunneled in a retrograde fashion from the chest wall to the venotomy incision. At the venotomy, serial dilatation was performed and a 15 Fr peel-away sheath was placed over a guidewire. The catheter was then placed through the sheath and the sheath removed. Final catheter positioning was confirmed and documented with a fluoroscopic spot image. The catheter was aspirated, flushed with saline, and injected with appropriate volume heparin dwells. The venotomy incision was closed with subcuticular 4-0 Vicryl. Dermabond was applied to the incision. The catheter exit site was secured with 0-Prolene retention sutures. COMPLICATIONS: None.  No pneumothorax. FINDINGS: After catheter placement, the tip lies in the right atrium. The catheter aspirates normally and is ready for immediate use. IMPRESSION: Placement of tunneled hemodialysis catheter via new puncture of the right internal jugular vein after removal of the non tunneled catheter. The new catheter tip lies in the right atrium. The catheter is ready for immediate use. Electronically Signed   By: Irish Lack M.D.   On: 11/12/2023 10:45   IR US Guide Vasc Access Right Result Date: 11/12/2023 CLINICAL DATA:  End-stage renal disease and status post placement of non tunneled temporary dialysis catheter on 11/01/2023. The patient now requires placement of a tunneled dialysis catheter for further longer-term hemodialysis. EXAM: TUNNELED  CENTRAL VENOUS HEMODIALYSIS CATHETER PLACEMENT WITH ULTRASOUND AND FLUOROSCOPIC GUIDANCE ANESTHESIA/SEDATION: Moderate (conscious) sedation was employed during this procedure. A total of Versed 1.0 mg and Fentanyl 50 mcg was administered intravenously. Moderate Sedation Time: 16 minutes. The patient's level of consciousness and vital signs were monitored continuously by radiology nursing  throughout the procedure under my direct supervision. MEDICATIONS: The patient is receiving IV Rocephin every 8 hours in the hospital and did not receive additional antibiotics for the procedure. FLUOROSCOPY: 24 seconds.  2.3 mGy. PROCEDURE: The procedure, risks, benefits, and alternatives were explained to the patient. Questions regarding the procedure were encouraged and answered. The patient understands and consents to the procedure. A timeout was performed prior to initiating the procedure. A non tunneled temporary right-sided dialysis catheter was removed prior to the procedure and pressure held at the exit site. The right neck and chest were prepped with chlorhexidine in a sterile fashion, and a sterile drape was applied covering the operative field. Maximum barrier sterile technique with sterile gowns and gloves were used for the procedure. Local anesthesia was provided with 1% lidocaine. Ultrasound was used to confirm patency of the right internal jugular vein. An ultrasound image was saved and recorded. After creating a small venotomy incision, a 21 gauge needle was advanced into the right internal jugular vein under direct, real-time ultrasound guidance. Ultrasound image documentation was performed. After securing guidewire access, an 8 Fr dilator was placed. A J-wire was kinked to measure appropriate catheter length. A Palindrome tunneled hemodialysis catheter measuring 19 cm from tip to cuff was chosen for placement. This was tunneled in a retrograde fashion from the chest wall to the venotomy incision. At the venotomy, serial dilatation was performed and a 15 Fr peel-away sheath was placed over a guidewire. The catheter was then placed through the sheath and the sheath removed. Final catheter positioning was confirmed and documented with a fluoroscopic spot image. The catheter was aspirated, flushed with saline, and injected with appropriate volume heparin dwells. The venotomy incision was closed with  subcuticular 4-0 Vicryl. Dermabond was applied to the incision. The catheter exit site was secured with 0-Prolene retention sutures. COMPLICATIONS: None.  No pneumothorax. FINDINGS: After catheter placement, the tip lies in the right atrium. The catheter aspirates normally and is ready for immediate use. IMPRESSION: Placement of tunneled hemodialysis catheter via new puncture of the right internal jugular vein after removal of the non tunneled catheter. The new catheter tip lies in the right atrium. The catheter is ready for immediate use. Electronically Signed   By: Irish Lack M.D.   On: 11/12/2023 10:45   IR US Guide Vasc Access Left Result Date: 11/08/2023 INDICATION: ESRD.  IV antibiotics. EXAM: ULTRASOUND AND FLUOROSCOPIC GUIDED PLACEMENT OF TUNNELED CENTRAL VENOUS "Powerline" CATHETER MEDICATIONS: The patient was on scheduled IV antibiotics. ANESTHESIA/SEDATION: Local anesthetic was administered. The patient was continuously monitored during the procedure by the interventional radiology nurse under my direct supervision. FLUOROSCOPY TIME:  Fluoroscopic dose; 3.8 mGy COMPLICATIONS: None immediate. PROCEDURE: Informed written consent was obtained from the patient and/or patient's representative after a discussion of the risks, benefits, and alternatives to treatment. Questions regarding the procedure were encouraged and answered. The LEFT neck and chest were prepped with chlorhexidine in a sterile fashion, and a sterile drape was applied covering the operative field. Maximum barrier sterile technique with sterile gowns and gloves were used for the procedure. A timeout was performed prior to the initiation of the procedure. After the overlying soft tissues were anesthetized, a  small venotomy incision was created and a micropuncture kit was utilized to access the internal jugular vein. Real-time ultrasound guidance was utilized for vascular access including the acquisition of a permanent ultrasound image  documenting patency of the accessed vessel. The microwire was utilized to measure appropriate catheter length. The micropuncture sheath was exchanged for a peel-away sheath over a guidewire. A 5 Fr dual lumen tunneled central venous catheter measuring 33 cm was tunneled in a retrograde fashion from the anterior chest wall to the venotomy incision. The catheter was then placed through the peel-away sheath with tip ultimately positioned at the superior caval-atrial junction. Final catheter positioning was confirmed and documented with a spot radiographic image. The catheter aspirates and flushes normally. The catheter was flushed with appropriate volume heparin dwells. The catheter exit site was secured with a 3-0 Ethilon retention suture. The venotomy incision was closed with Dermabond. Dressings were applied. The patient tolerated the procedure well without immediate post procedural complication. FINDINGS: After catheter placement, the tip lies within the superior apect of the right atrium. The catheter aspirates and flushes normally and is ready for immediate use. IMPRESSION: Successful placement of 33 cm dual lumen tunneled "Powerline" central venous catheter via the LEFT internal jugular vein The tip of the catheter is positioned within the proximal RIGHT atrium. The catheter is ready for immediate use. Roanna Banning, MD [facility] Radiology Electronically Signed   By: Roanna Banning M.D.   On: 11/08/2023 18:10   IR Fluoro Guide CV Line Left Result Date: 11/08/2023 INDICATION: ESRD.  IV antibiotics. EXAM: ULTRASOUND AND FLUOROSCOPIC GUIDED PLACEMENT OF TUNNELED CENTRAL VENOUS "Powerline" CATHETER MEDICATIONS: The patient was on scheduled IV antibiotics. ANESTHESIA/SEDATION: Local anesthetic was administered. The patient was continuously monitored during the procedure by the interventional radiology nurse under my direct supervision. FLUOROSCOPY TIME:  Fluoroscopic  dose; 3.8 mGy COMPLICATIONS: None immediate. PROCEDURE: Informed written consent was obtained from the patient and/or patient's representative after a discussion of the risks, benefits, and alternatives to treatment. Questions regarding the procedure were encouraged and answered. The LEFT neck and chest were prepped with chlorhexidine in a sterile fashion, and a sterile drape was applied covering the operative field. Maximum barrier sterile technique with sterile gowns and gloves were used for the procedure. A timeout was performed prior to the initiation of the procedure. After the overlying soft tissues were anesthetized, a small venotomy incision was created and a micropuncture kit was utilized to access the internal jugular vein. Real-time ultrasound guidance was utilized for vascular access including the acquisition of a permanent ultrasound image documenting patency of the accessed vessel. The microwire was utilized to measure appropriate catheter length. The micropuncture sheath was exchanged for a peel-away sheath over a guidewire. A 5 Fr dual lumen tunneled central venous catheter measuring 33 cm was tunneled in a retrograde fashion from the anterior chest wall to the venotomy incision. The catheter was then placed through the peel-away sheath with tip ultimately positioned at the superior caval-atrial junction. Final catheter positioning was confirmed and documented with a spot radiographic image. The catheter aspirates and flushes normally. The catheter was flushed with appropriate volume heparin dwells. The catheter exit site was secured with a 3-0 Ethilon retention suture. The venotomy incision was closed with Dermabond. Dressings were applied. The patient tolerated the procedure well without immediate post procedural complication. FINDINGS: After catheter placement, the tip lies within the superior apect of the right atrium. The catheter aspirates and flushes normally and is ready  for immediate use.  IMPRESSION: Successful placement of 33 cm dual lumen tunneled "Powerline" central venous catheter via the LEFT internal jugular vein The tip of the catheter is positioned within the proximal RIGHT atrium. The catheter is ready for immediate use. Roanna Banning, MD [facility] Radiology Electronically Signed   By: Roanna Banning M.D.   On: 11/08/2023 18:10   VAS [place] LOWER EXTREMITY VENOUS (DVT) Result Date: 11/07/2023  Lower Venous DVT Study Patient Name:  [name] [facility]  Date of Exam:   11/07/2023 Medical Rec #: [id number]          Accession #:    [id number] Date of Birth: Dec 31, 1955          Patient Gender: F Patient Age:   72 years Exam Location:  [facility] Procedure:      VAS [place] LOWER EXTREMITY VENOUS (DVT) Referring Phys: Tresa Endo OSBORNE --------------------------------------------------------------------------------  Indications: Pain, stroke, and A-fib, anemia.  Comparison Study: No prior exam. Performing Technologist: Fernande Bras  Examination Guidelines: A complete evaluation includes B-mode imaging, spectral Doppler, color Doppler, and power Doppler as needed of all accessible portions of each vessel. Bilateral testing is considered an integral part of a complete examination. Limited examinations for reoccurring indications may be performed as noted. The reflux portion of the exam is performed with the patient in reverse Trendelenburg.  +---------+---------------+---------+-----------+----------+--------------+ RIGHT    CompressibilityPhasicitySpontaneityPropertiesThrombus Aging +---------+---------------+---------+-----------+----------+--------------+ CFV      Full           Yes      Yes                                 +---------+---------------+---------+-----------+----------+--------------+ SFJ      Full           Yes      Yes                                  +---------+---------------+---------+-----------+----------+--------------+ FV Prox  Full                                                        +---------+---------------+---------+-----------+----------+--------------+ FV Mid   Full                                                        +---------+---------------+---------+-----------+----------+--------------+ FV DistalFull                                                        +---------+---------------+---------+-----------+----------+--------------+ PFV      Full                                                        +---------+---------------+---------+-----------+----------+--------------+  POP      Full           Yes      Yes                                 +---------+---------------+---------+-----------+----------+--------------+ PTV      Full                                                        +---------+---------------+---------+-----------+----------+--------------+ PERO     Full                                                        +---------+---------------+---------+-----------+----------+--------------+   +---------+---------------+---------+-----------+----------+--------------+ LEFT     CompressibilityPhasicitySpontaneityPropertiesThrombus Aging +---------+---------------+---------+-----------+----------+--------------+ CFV      Full           Yes      Yes                                 +---------+---------------+---------+-----------+----------+--------------+ SFJ      Full           Yes      Yes                                 +---------+---------------+---------+-----------+----------+--------------+ FV Prox  Full                                                        +---------+---------------+---------+-----------+----------+--------------+ FV Mid   Full                                                         +---------+---------------+---------+-----------+----------+--------------+ FV DistalFull                                                        +---------+---------------+---------+-----------+----------+--------------+ PFV      Full                                                        +---------+---------------+---------+-----------+----------+--------------+ POP      Full           Yes      Yes                                 +---------+---------------+---------+-----------+----------+--------------+  PTV      Full                                                        +---------+---------------+---------+-----------+----------+--------------+ PERO     Full                                                        +---------+---------------+---------+-----------+----------+--------------+     Summary: BILATERAL: - No evidence of deep vein thrombosis seen in the lower extremities, bilaterally. -No evidence of popliteal cyst, bilaterally.   *See table(s) above for measurements and observations. Electronically signed by Heath Lark on 11/07/2023 at 4:05:14 PM.    Final    CT ABDOMEN PELVIS WO CONTRAST Result Date: 11/06/2023 CLINICAL DATA:  Diverticulitis, complication suspected EXAM: CT ABDOMEN AND PELVIS WITHOUT CONTRAST TECHNIQUE: Multidetector CT imaging of the abdomen and pelvis was performed following the standard protocol without IV contrast. RADIATION DOSE REDUCTION: This exam was performed according to the departmental dose-optimization program which includes automated exposure control, adjustment of the mA and/or kV according to patient size and/or use of iterative reconstruction technique. COMPARISON:  10/31/2023 FINDINGS: Lower chest: Volume loss and opacity in the lower lungs consistent with atelectasis. There is a central line with tip at the upper right atrium. Coronary atherosclerosis. Hepatobiliary: No focal liver abnormality. Calcific in the intermediate density  in the gallbladder attributed to stones. No biliary dilatation or pericholecystic edema. Pancreas: Unremarkable. Spleen: Unremarkable. Adrenals/Urinary Tract: Negative adrenals. No hydronephrosis or stone. Renal cortical thinning and lobulation at the left upper pole. 3 mm stone at the upper pole left kidney. The urinary bladder is collapsed around a Foley catheter. Stomach/Bowel: Descending colostomy. Bowel anastomosis in the right abdomen. No evidence of bowel obstruction. Fluid collection with loculation appearance/peritoneal thickening along the left flank measuring up to 5 x 6 x 10 cm. Milder fluid accumulation in the ventral peritoneal space. Vascular/Lymphatic: Extensive atheromatous calcification of the aorta and iliacs. No mass or adenopathy. Reproductive:No pathologic findings. Other: No ascites or pneumoperitoneum. Musculoskeletal: No acute abnormalities.  T9 butterfly vertebra IMPRESSION: 1. Interval colostomy with fluid collection along the left gutter, dominant pocket measuring 10 x 6 x 5 cm. 2. No bowel obstruction. 3. Cholelithiasis and left nephrolithiasis. Electronically Signed   By: Tiburcio Pea M.D.   On: 11/06/2023 07:12   DG Chest Port 1 View Result Date: 11/03/2023 CLINICAL DATA:  Shortness of breath EXAM: PORTABLE CHEST - 1 VIEW COMPARISON:  11/11/2018 FINDINGS: Unchanged mild cardiomegaly and pulmonary vascular congestion. Interval worsening of left basilar opacity likely due to atelectasis. Lungs otherwise clear. Examination is limited due to expiratory phase of imaging. Interval placement of temporary right IJ hemodialysis catheter which terminates in the region of the right atrium. Nasogastric tube terminates in the left upper quadrant. IMPRESSION: Interval worsening of left basilar opacity which is likely due to atelectasis. Electronically Signed   By: Acquanetta Belling M.D.   On: 11/03/2023 07:22   IR Fluoro Guide CV Line Right Result Date: 11/01/2023 INDICATION: ESRD requiring HD.  EXAM: NON-TUNNELED CENTRAL VENOUS HEMODIALYSIS CATHETER PLACEMENT WITH ULTRASOUND AND FLUOROSCOPIC GUIDANCE COMPARISON:  CT AP 10/31/2023. MEDICATIONS: Local anesthetic was  administered. FLUOROSCOPY TIME:  Fluoroscopic dose; 35 mGy COMPLICATIONS: None immediate. PROCEDURE: Informed written consent was obtained from the patient and/or patient's representative after a discussion of the risks, benefits, and alternatives to treatment. Questions regarding the procedure were encouraged and answered. The RIGHT neck and chest were prepped with chlorhexidine in a sterile fashion, and a sterile drape was applied covering the operative field. Maximum barrier sterile technique with sterile gowns and gloves were used for the procedure. A timeout was performed prior to the initiation of the procedure. After the overlying soft tissues were anesthetized, a small venotomy incision was created and a micropuncture kit was utilized to access the external jugular vein. Real-time ultrasound guidance was utilized for vascular access including the acquisition of a permanent ultrasound image documenting patency of the accessed vessel. The microwire was utilized to measure appropriate catheter length. A stiff glidewire was advanced to the level of the IVC. Under fluoroscopic guidance, the venotomy was serially dilated, ultimately allowing placement of a 20 cm temporary Trialysis catheter with tip ultimately terminating within the superior aspect of the right atrium. Final catheter positioning was confirmed and documented with a spot radiographic image. The catheter aspirates and flushes normally. The catheter was flushed with appropriate volume heparin dwells. The catheter exit site was secured with a 2-0 Ethilon retention suture. A dressing was placed. The patient tolerated the procedure well without immediate post procedural complication. IMPRESSION: Successful placement of a RIGHT EXTERNAL jugular approach 20 cm non-tunneled dialysis  catheter The tip of the catheter is positioned within the proximal RIGHT atrium. The catheter is ready for immediate use. PLAN: This catheter may be converted to a tunneled dialysis catheter at a later date as indicated. Roanna Banning, MD [facility] Radiology Electronically Signed   By: Roanna Banning M.D.   On: 11/01/2023 18:15   IR US Guide Vasc Access Right Result Date: 11/01/2023 INDICATION: ESRD requiring HD. EXAM: NON-TUNNELED CENTRAL VENOUS HEMODIALYSIS CATHETER PLACEMENT WITH ULTRASOUND AND FLUOROSCOPIC GUIDANCE COMPARISON:  CT AP 10/31/2023. MEDICATIONS: Local anesthetic was administered. FLUOROSCOPY TIME:  Fluoroscopic dose; 35 mGy COMPLICATIONS: None immediate. PROCEDURE: Informed written consent was obtained from the patient and/or patient's representative after a discussion of the risks, benefits, and alternatives to treatment. Questions regarding the procedure were encouraged and answered. The RIGHT neck and chest were prepped with chlorhexidine in a sterile fashion, and a sterile drape was applied covering the operative field. Maximum barrier sterile technique with sterile gowns and gloves were used for the procedure. A timeout was performed prior to the initiation of the procedure. After the overlying soft tissues were anesthetized, a small venotomy incision was created and a micropuncture kit was utilized to access the external jugular vein. Real-time ultrasound guidance was utilized for vascular access including the acquisition of a permanent ultrasound image documenting patency of the accessed vessel. The microwire was utilized to measure appropriate catheter length. A stiff glidewire was advanced to the level of the IVC. Under fluoroscopic guidance, the venotomy was serially dilated, ultimately allowing placement of a 20 cm temporary Trialysis catheter with tip ultimately terminating within the superior aspect of the right atrium. Final catheter  positioning was confirmed and documented with a spot radiographic image. The catheter aspirates and flushes normally. The catheter was flushed with appropriate volume heparin dwells. The catheter exit site was secured with a 2-0 Ethilon retention suture. A dressing was placed. The patient tolerated the procedure well without immediate post procedural complication. IMPRESSION: Successful placement of a RIGHT  EXTERNAL jugular approach 20 cm non-tunneled dialysis catheter The tip of the catheter is positioned within the proximal RIGHT atrium. The catheter is ready for immediate use. PLAN: This catheter may be converted to a tunneled dialysis catheter at a later date as indicated. Roanna Banning, MD [facility] Electronically Signed   By: Roanna Banning M.D.   On: 11/01/2023 18:15   CT ABDOMEN PELVIS WO CONTRAST Result Date: 10/31/2023 CLINICAL DATA:  Diverticulitis, complication suspected. EXAM: CT ABDOMEN AND PELVIS WITHOUT CONTRAST TECHNIQUE: Multidetector CT imaging of the abdomen and pelvis was performed following the standard protocol without IV contrast. RADIATION DOSE REDUCTION: This exam was performed according to the departmental dose-optimization program which includes automated exposure control, adjustment of the mA and/or kV according to patient size and/or use of iterative reconstruction technique. COMPARISON:  10/27/2023. FINDINGS: Lower chest: Heart is enlarged and multi-vessel coronary artery calcifications are noted. Consolidation and patchy airspace disease is present at the lung bases bilaterally. Hepatobiliary: No focal liver abnormality is seen. Stones are present within the gallbladder. No biliary ductal dilatation. Pancreas: Unremarkable. No pancreatic ductal dilatation or surrounding inflammatory changes. Spleen: Normal in size without focal abnormality. Adrenals/Urinary Tract: The adrenal glands are within normal limits. Calcifications are  present in the upper pole of the left kidney. There is a cyst in the lower pole of the right kidney. No hydronephrosis bilaterally. The bladder is unremarkable. Stomach/Bowel: The stomach is within normal limits. No bowel obstruction or pneumatosis is seen. Right hemicolectomy changes are noted. There is diffuse colonic wall thickening. Scattered diverticula are present along the colon. A few small foci of free air are noted in the mesentery in the mid left abdomen, slightly increased from the previous exam. There is suggestion of contrast extravasation in the region. There is fine no definite abscess is seen. Vascular/Lymphatic: Aortic atherosclerosis. No enlarged abdominal or pelvic lymph nodes. Reproductive: No acute abnormality. Other: Mild ascites is noted in all 4 quadrants. A fat containing umbilical hernia is noted. There is mild anasarca. Musculoskeletal: The bony structures are stable. IMPRESSION: 1. Worsening diverticulitis of the descending colon with perforation and increasing free air and suspected oral contrast extravasation in the mid left abdomen. No definite abscess is seen. Surgical consultation is recommended. 2. Mild ascites. 3. Diffuse colonic wall thickening which may be due to associated inflammatory changes. 4. Patchy airspace disease at the lung bases with bilateral lower lobe consolidation. 5. Cholelithiasis. 6. Aortic atherosclerosis. Critical Value/emergent results were called by telephone at the time of interpretation on 10/31/2023 at 11:59 pm to provider Dr. Antionette Char, who verbally acknowledged these results. Electronically Signed   By: Thornell Sartorius M.D.   On: 10/31/2023 23:59   CT ABDOMEN PELVIS WO CONTRAST Result Date: 10/27/2023 CLINICAL DATA:  Acute nonlocalized abdominal pain. Chills and fever. Vomiting and diarrhea. EXAM: CT ABDOMEN AND PELVIS WITHOUT CONTRAST TECHNIQUE: Multidetector CT imaging of the abdomen and pelvis was performed following the standard protocol without IV  contrast. RADIATION DOSE REDUCTION: This exam was performed according to the departmental dose-optimization program which includes automated exposure control, adjustment of the mA and/or kV according to patient size and/or use of iterative reconstruction technique. COMPARISON:  CT abdomen and pelvis 04/19/2021 FINDINGS: Lower chest: Bibasilar atelectasis.  No acute abnormality. Hepatobiliary: Cholelithiasis without evidence of acute cholecystitis. No biliary dilation. Unremarkable noncontrast appearance of the liver. Pancreas: Unremarkable. Spleen: Unremarkable. Adrenals/Urinary Tract: Normal adrenal glands. No urinary calculi or hydronephrosis. Unremarkable bladder. Stomach/Bowel: Stomach is within normal  limits. No bowel obstruction. Colonic diverticulosis. Wall thickening, inflammatory stranding, and free fluid about the descending colon. Loosely organized fluid and gas about the inflamed descending colon (circa series 3/image 49-50) compatible with contained perforation. The adjacent jejunum is inflamed. Postoperative change about the colon with ileocolonic anastomosis in the right upper quadrant. Vascular/Lymphatic: Aortic atherosclerosis. No enlarged abdominal or pelvic lymph nodes. Reproductive: No acute abnormality. Other: Fat containing periumbilical hernia. Musculoskeletal: No acute fracture. IMPRESSION: 1. Acute diverticulitis of the descending colon with contained perforation. 2. Inflamed jejunum adjacent to the perforated diverticulitis. 3.  Aortic Atherosclerosis (ICD10-I70.0). Critical Value/emergent results were called by telephone at the time of interpretation on 10/27/2023 at 9:22 pm to provider [facility] , who verbally acknowledged these results. Electronically Signed   By: Minerva Fester M.D.   On: 10/27/2023 21:24    Signature  -   Jeoffrey Massed M.D on 11/21/2023 at 11:37 AM   -  To page go to [email or web address]

## 2023-11-21 NOTE — Progress Notes (Signed)
 Patient ID: [name], female   DOB: Jul 31, 1956, 68 y.o.   MRN: [id number] [facility] Progress Note   Assessment/ Plan:   1.  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  After initial refusal of wanting to pursue renal replacement therapy, she changed her mind and asked to start dialysis with the intention of undergoing surgery for contained perforation of acute diverticulitis.  -TDC w/ IR on 2/18 -Maintain dialysis on MWF schedule -Palliative care has been involved and patient wishes to continue with dialysis for now.   -CLIP in process; likely [facility], renal navigator following.  Probably going to CIR--pending -Would hold on permanent access creation for now given overall clinical picture, will likely need to revisit as an outpatient 2.  Acute diverticulitis with contained perforation of descending colon: On Zosyn and now status post exploratory laparotomy with Hartman's resection for perforated diverticulitis/peritonitis (2/8).  Management per surgery and primary team 3.  Anion gap metabolic acidosis: Resolved with renal replacement therapy 4.  Anemia: Has received blood and IV iron. ESA also ordered, increased to qmonday 5. HTN: BP acceptable/stable, uf as tolerated 6. Dispo: eval for CIR.  Subjective:   Patient seen and examined bedside. No acute events/complaints   Objective:   BP 107/63 (BP Location: Right Arm)   Pulse 75   Temp 98.8 F (37.1 C) (Oral)   Resp 17   Ht 5' 7.01" (1.702 m)   Wt 91 kg   SpO2 96%   BMI 31.41 kg/m   Intake/Output Summary (Last 24 hours) at 11/21/2023 1207 Last data filed at 11/21/2023 0900 Gross per 24 hour  Intake 560 ml  Output 1 ml  Net 559 ml   Weight change:   Physical Exam: Gen: nad, in chair CVS: Normal rate, no rub Resp: Bilateral chest rise with no increased  work of breathing Abd: Minimal distention, colostomy bag in place Ext: no sig edema b/l LEs Neuro: awake, alert Dialysis access: right TDC c/d/i  Imaging: No results found.      Labs: BMET Recent Labs  Lab 11/15/23 0806 11/18/23 0551 11/19/23 0834 11/20/23 0820  NA 136 135 134* 133*  K 3.5 3.5 3.5 3.7  CL 99 100 97* 96*  CO2 24 22 26 24   GLUCOSE 96 118* 88 164*  BUN 30* 30* 17 24*  CREATININE 7.20* 8.20* 6.15* 7.84*  CALCIUM 7.8* 7.9* 8.2* 8.0*  PHOS 4.9* 4.8* 4.3 5.6*   CBC Recent Labs  Lab 11/15/23 0806 11/18/23 0551 11/19/23 0834 11/20/23 0820  WBC 13.5* 10.7* 10.2 10.3  HGB 8.6* 9.1* 10.1* 9.3*  HCT 26.7* 28.8* 31.1* 29.8*  MCV 84.2 85.0 84.1 85.6  PLT 325 338 227 232    Medications:     amLODipine  5 mg Oral Daily   apixaban  5 mg Oral BID   brimonidine  1 drop Both Eyes BID   carvedilol  6.25 mg Oral BID WC   Chlorhexidine Gluconate Cloth  6 each Topical Q0600   [START ON 11/25/2023] darbepoetin (ARANESP) injection - DIALYSIS  100 mcg Subcutaneous Q Mon-1800   docusate sodium  100 mg Oral BID   insulin aspart  0-15 Units Subcutaneous TID WC   insulin aspart  0-5 Units Subcutaneous QHS   insulin glargine  12 Units Subcutaneous Q24H   latanoprost  1 drop Both Eyes QHS   mouth rinse  15 mL  Mouth Rinse 4 times per day   pantoprazole  40 mg Oral BID   polyethylene glycol  17 g Oral BID   senna  1 tablet Oral Daily   sodium chloride flush  10-40 mL Intracatheter Q12H   [name]  11/21/2023, 12:07 PM

## 2023-11-21 NOTE — TOC Progression Note (Addendum)
 Transition of Care [facility]) - Progression Note    Patient Details  Name: [name] MRN: [id number] Date of Birth: 1955-12-01  [facility] CM/SW Contact  Mearl Latin, LCSW Phone Number: 11/21/2023, 11:57 AM  Clinical Narrative:    11am-[facility] reports they are rescinding bed offer due to patient's wounds. CSW will search for SNF that can accept dialysis and coordinate with renal navigator to change HD center.   2:06 PM-[facility] able to accept patient at [facility]. CSW sent them wound notes and colostomy info with updated therapy notes. They will begin insurance process.   Expected Discharge Plan: Skilled Nursing Facility Barriers to Discharge: Continued Medical Work up, English as a second language teacher  Expected Discharge Plan and Services In-house Referral: Clinical Social Work   Post Acute Care Choice: IP Rehab Living arrangements for the past 2 months: Single Family Home                                       Social Determinants of Health (SDOH) Interventions SDOH Screenings   Food Insecurity: No Food Insecurity (10/28/2023)  Housing: Low Risk  (10/28/2023)  Transportation Needs: No Transportation Needs (10/28/2023)  Utilities: Not At Risk (10/28/2023)  Depression (PHQ2-9): Low Risk  (02/28/2021)  Financial Resource Strain: Low Risk  (08/25/2021)   Received from [facility], [facility]  Physical Activity: Insufficiently Active (06/20/2021)   Received from [facility], [facility]  Social Connections: Socially Isolated (10/28/2023)  Stress: No Stress Concern Present (08/25/2021)   Received from [facility], [facility]  Tobacco Use: Low Risk  (11/02/2023)    Readmission Risk Interventions     No data to display

## 2023-11-21 NOTE — Plan of Care (Signed)
 Problem: Education: Goal: Ability to describe self-care measures that may prevent or decrease complications (Diabetes Survival Skills Education) will improve 11/21/2023 1330 by Mariam Dollar, RN Outcome: Progressing 11/21/2023 1330 by Mariam Dollar, RN Outcome: Progressing Goal: Individualized Educational Video(s) 11/21/2023 1330 by Mariam Dollar, RN Outcome: Progressing 11/21/2023 1330 by Mariam Dollar, RN Outcome: Progressing   Problem: Coping: Goal: Ability to adjust to condition or change in health will improve 11/21/2023 1330 by Mariam Dollar, RN Outcome: Progressing 11/21/2023 1330 by Mariam Dollar, RN Outcome: Progressing   Problem: Fluid Volume: Goal: Ability to maintain a balanced intake and output will improve 11/21/2023 1330 by Mariam Dollar, RN Outcome: Progressing 11/21/2023 1330 by Mariam Dollar, RN Outcome: Progressing   Problem: Health Behavior/Discharge Planning: Goal: Ability to identify and utilize available resources and services will improve 11/21/2023 1330 by Mariam Dollar, RN Outcome: Progressing 11/21/2023 1330 by Mariam Dollar, RN Outcome: Progressing Goal: Ability to manage health-related needs will improve 11/21/2023 1330 by Mariam Dollar, RN Outcome: Progressing 11/21/2023 1330 by Mariam Dollar, RN Outcome: Progressing   Problem: Metabolic: Goal: Ability to maintain appropriate glucose levels will improve 11/21/2023 1330 by Mariam Dollar, RN Outcome: Progressing 11/21/2023 1330 by Mariam Dollar, RN Outcome: Progressing   Problem: Nutritional: Goal: Maintenance of adequate nutrition will improve 11/21/2023 1330 by Mariam Dollar, RN Outcome: Progressing 11/21/2023 1330 by Mariam Dollar, RN Outcome: Progressing Goal: Progress toward achieving an optimal weight will improve 11/21/2023 1330 by Mariam Dollar, RN Outcome: Progressing 11/21/2023 1330 by Mariam Dollar,  RN Outcome: Progressing   Problem: Skin Integrity: Goal: Risk for impaired skin integrity will decrease 11/21/2023 1330 by Mariam Dollar, RN Outcome: Progressing 11/21/2023 1330 by Mariam Dollar, RN Outcome: Progressing   Problem: Tissue Perfusion: Goal: Adequacy of tissue perfusion will improve 11/21/2023 1330 by Mariam Dollar, RN Outcome: Progressing 11/21/2023 1330 by Mariam Dollar, RN Outcome: Progressing   Problem: Education: Goal: Knowledge of General Education information will improve Description: Including pain rating scale, medication(s)/side effects and non-pharmacologic comfort measures 11/21/2023 1330 by Mariam Dollar, RN Outcome: Progressing 11/21/2023 1330 by Mariam Dollar, RN Outcome: Progressing   Problem: Health Behavior/Discharge Planning: Goal: Ability to manage health-related needs will improve 11/21/2023 1330 by Mariam Dollar, RN Outcome: Progressing 11/21/2023 1330 by Mariam Dollar, RN Outcome: Progressing   Problem: Clinical Measurements: Goal: Ability to maintain clinical measurements within normal limits will improve 11/21/2023 1330 by Mariam Dollar, RN Outcome: Progressing 11/21/2023 1330 by Mariam Dollar, RN Outcome: Progressing Goal: Will remain free from infection 11/21/2023 1330 by Mariam Dollar, RN Outcome: Progressing 11/21/2023 1330 by Mariam Dollar, RN Outcome: Progressing Goal: Diagnostic test results will improve 11/21/2023 1330 by Mariam Dollar, RN Outcome: Progressing 11/21/2023 1330 by Mariam Dollar, RN Outcome: Progressing Goal: Respiratory complications will improve 11/21/2023 1330 by Mariam Dollar, RN Outcome: Progressing 11/21/2023 1330 by Mariam Dollar, RN Outcome: Progressing Goal: Cardiovascular complication will be avoided 11/21/2023 1330 by Mariam Dollar, RN Outcome: Progressing 11/21/2023 1330 by Mariam Dollar, RN Outcome: Progressing    Problem: Activity: Goal: Risk for activity intolerance will decrease 11/21/2023 1330 by Mariam Dollar, RN Outcome: Progressing 11/21/2023 1330 by Mariam Dollar, RN Outcome: Progressing   Problem: Nutrition: Goal: Adequate nutrition will be maintained 11/21/2023 1330 by Mariam Dollar, RN Outcome: Progressing 11/21/2023 1330 by Mariam Dollar, RN Outcome: Progressing  Problem: Coping: Goal: Level of anxiety will decrease 11/21/2023 1330 by Mariam Dollar, RN Outcome: Progressing 11/21/2023 1330 by Mariam Dollar, RN Outcome: Progressing   Problem: Elimination: Goal: Will not experience complications related to bowel motility 11/21/2023 1330 by Mariam Dollar, RN Outcome: Progressing 11/21/2023 1330 by Mariam Dollar, RN Outcome: Progressing Goal: Will not experience complications related to urinary retention 11/21/2023 1330 by Mariam Dollar, RN Outcome: Progressing 11/21/2023 1330 by Mariam Dollar, RN Outcome: Progressing   Problem: Pain Managment: Goal: General experience of comfort will improve and/or be controlled 11/21/2023 1330 by Mariam Dollar, RN Outcome: Progressing 11/21/2023 1330 by Mariam Dollar, RN Outcome: Progressing   Problem: Safety: Goal: Ability to remain free from injury will improve 11/21/2023 1330 by Mariam Dollar, RN Outcome: Progressing 11/21/2023 1330 by Mariam Dollar, RN Outcome: Progressing   Problem: Skin Integrity: Goal: Risk for impaired skin integrity will decrease 11/21/2023 1330 by Mariam Dollar, RN Outcome: Progressing 11/21/2023 1330 by Mariam Dollar, RN Outcome: Progressing   Problem: Education: Goal: Knowledge of the prescribed therapeutic regimen will improve 11/21/2023 1330 by Mariam Dollar, RN Outcome: Progressing 11/21/2023 1330 by Mariam Dollar, RN Outcome: Progressing   Problem: Coping: Goal: Ability to identify and develop effective coping  behavior will improve 11/21/2023 1330 by Mariam Dollar, RN Outcome: Progressing 11/21/2023 1330 by Mariam Dollar, RN Outcome: Progressing   Problem: Clinical Measurements: Goal: Quality of life will improve 11/21/2023 1330 by Mariam Dollar, RN Outcome: Progressing 11/21/2023 1330 by Mariam Dollar, RN Outcome: Progressing   Problem: Respiratory: Goal: Verbalizations of increased ease of respirations will increase 11/21/2023 1330 by Mariam Dollar, RN Outcome: Progressing 11/21/2023 1330 by Mariam Dollar, RN Outcome: Progressing   Problem: Role Relationship: Goal: Family's ability to cope with current situation will improve 11/21/2023 1330 by Mariam Dollar, RN Outcome: Progressing 11/21/2023 1330 by Mariam Dollar, RN Outcome: Progressing Goal: Ability to verbalize concerns, feelings, and thoughts to partner or family member will improve 11/21/2023 1330 by Mariam Dollar, RN Outcome: Progressing 11/21/2023 1330 by Mariam Dollar, RN Outcome: Progressing   Problem: Pain Management: Goal: Satisfaction with pain management regimen will improve 11/21/2023 1330 by Mariam Dollar, RN Outcome: Progressing 11/21/2023 1330 by Mariam Dollar, RN Outcome: Progressing   Problem: Education: Goal: Knowledge of disease and its progression will improve 11/21/2023 1330 by Mariam Dollar, RN Outcome: Progressing 11/21/2023 1330 by Mariam Dollar, RN Outcome: Progressing Goal: Individualized Educational Video(s) 11/21/2023 1330 by Mariam Dollar, RN Outcome: Progressing 11/21/2023 1330 by Mariam Dollar, RN Outcome: Progressing   Problem: Fluid Volume: Goal: Compliance with measures to maintain balanced fluid volume will improve 11/21/2023 1330 by Mariam Dollar, RN Outcome: Progressing 11/21/2023 1330 by Mariam Dollar, RN Outcome: Progressing   Problem: Health Behavior/Discharge Planning: Goal: Ability to manage  health-related needs will improve 11/21/2023 1330 by Mariam Dollar, RN Outcome: Progressing 11/21/2023 1330 by Mariam Dollar, RN Outcome: Progressing   Problem: Nutritional: Goal: Ability to make healthy dietary choices will improve 11/21/2023 1330 by Mariam Dollar, RN Outcome: Progressing 11/21/2023 1330 by Mariam Dollar, RN Outcome: Progressing   Problem: Clinical Measurements: Goal: Complications related to the disease process, condition or treatment will be avoided or minimized 11/21/2023 1330 by Mariam Dollar, RN Outcome: Progressing 11/21/2023 1330 by Mariam Dollar, RN Outcome: Progressing

## 2023-11-21 NOTE — TOC Progression Note (Signed)
 Transition of Care [facility]) - Progression Note    Patient Details  Name: [name] MRN: [id number] Date of Birth: 10-31-1955  Transition of Care [facility]) CM/SW Contact  Jessie Foot, RN Phone Number: 11/21/2023, 3:19 PM  Clinical Narrative:    Spoke with patient to inform her that [facility] denied her due to wound. Case Manager will reach out to [facility] for acceptance. Patient mention having HH. Informed patient that will be a later discussion. Son will visit [facility].    Expected Discharge Plan: Skilled Nursing Facility Barriers to Discharge: Continued Medical Work up, English as a second language teacher  Expected Discharge Plan and Services In-house Referral: Clinical Social Work   Post Acute Care Choice: IP Rehab Living arrangements for the past 2 months: Single Family Home                                       Social Determinants of Health (SDOH) Interventions SDOH Screenings   Food Insecurity: No Food Insecurity (10/28/2023)  Housing: Low Risk  (10/28/2023)  Transportation Needs: No Transportation Needs (10/28/2023)  Utilities: Not At Risk (10/28/2023)  Depression (PHQ2-9): Low Risk  (02/28/2021)  Financial Resource Strain: Low Risk  (08/25/2021)   Received from [facility], [facility]  Physical Activity: Insufficiently Active (06/20/2021)   Received from [facility], [facility]  Social Connections: Socially Isolated (10/28/2023)  Stress: No Stress Concern Present (08/25/2021)   Received from [facility], [facility]  Tobacco Use: Low Risk  (11/02/2023)    Readmission Risk Interventions     No data to display

## 2023-11-21 NOTE — Consult Note (Addendum)
 WOC Nurse wound follow up Wound type: midline surgical dehiscence Measurement: 18 cm x 4.5 cm x 2.5 cm  Wound bed: 100% red Drainage (amount, consistency, odor)  milky appearing effluent in canister 350 ml today at 0900. Periwound: intact, has a crease at 3 o'clock between wound and ostomy on the left.  Dressing procedure/placement/frequency: Removed old NPWT dressing Cleansed wound with normal saline Barrier ring placed 1/2 at 3 o'clock and 1/2 at 9 o'clock crease.  Filled wound with 1 pieces black foam   Sealed NPWT dressing at HG Patient did not received IV pain medication per bedside nurse prior to dressing change Patient tolerated procedure well   WOC nurse will continue to provide NPWT dressing changes due to the complexity of the dressing change. Next change MON   WOC Nurse ostomy follow up Stoma type/location: LMQ colostoy  Stomal assessment/size: oval 30x75mm pink moist, slightly above skin level.  Peristomal assessment: intact but midline incision with NPWT to right as above  Treatment options for stomal/peristomal skin: 2" barrier ring  Output approximately soft brown effluent in pouch. Leaking in the fold. Ostomy pouching: 1pc. [employer] [place] #[id number], and barrier ring [phone] Education provided: The pt today change the pouch system with hands on, sitting on the bed. She want to learn because she will do at home with no help, according to her.  Pt is legally blind, and has difficulty to cut. She told me that her son can do this for her. Suggested educational instructions for her soon on the next visit.   WOC team will continue to follow for NPWT and education and support of ostomy.  Next visit on MON.  Obs: pt will be discharge soon, ordered supplies for home. Enrolled on [employer]: YES  Thank-you,  Denyse Amass BSN, RN, [employer], WOC  (Pager: [phone])

## 2023-11-21 NOTE — Progress Notes (Signed)
 Physical Therapy Treatment Patient Details Name: [name] MRN: [id number] DOB: 07-Apr-1956 Today's Date: 11/21/2023   History of Present Illness Patient is a 68 y.o.  female - who presented with abdominal pain-found to have acute diverticulitis with contained perforation involving the descending colon. 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  Past medical history of CKD 5, A-fib on Eliquis, CAD    PT Comments  Pt tolerated treatment well today. Trialed rollator today to which pt tolerated well. Pt was able to progress ambulation distance in hallway with Min A however pt did require 4 standing rest breaks as she continues to be limited by decreased endurance. DC recs updated to SNF following CIR denial. PT will continue to follow.      If plan is discharge home, recommend the following: A little help with walking and/or transfers;A little help with bathing/dressing/bathroom;Assistance with cooking/housework;Assist for transportation;Help with stairs or ramp for entrance   Can travel by private vehicle     No  Equipment Recommendations  Rollator (4 wheels);Wheelchair (measurements PT);Wheelchair cushion (measurements PT);BSC/3in1    Recommendations for Other Services       Precautions / Restrictions Precautions Precautions: Fall Recall of Precautions/Restrictions: Intact Restrictions Weight Bearing Restrictions Per Provider Order: No     Mobility  Bed Mobility Overal bed mobility: Needs Assistance Bed Mobility: Supine to Sit     Supine to sit: Contact guard, HOB elevated, Used rails     General bed mobility comments: Pt completed bed mobility without physical assistance. CGA provided for safety. Pt provided with verbal cues for sequencing and hand placement when performing step pivot towards BSC.    Transfers Overall transfer level: Needs assistance Equipment used: None, Rollator (4 wheels) Transfers: Sit to/from Stand, Bed to chair/wheelchair/BSC Sit  to Stand: Contact guard assist   Step pivot transfers: Min assist       General transfer comment: verbal cues provided for B hand placement when performing STS transfers. MIN A to pivot to [facility] for line management and verbal cues provided for sequencing and hand placement. Trialed rollator today to which pt was educated on proper English as a second language teacher.    Ambulation/Gait Ambulation/Gait assistance: Min assist, +2 safety/equipment Gait Distance (Feet): 100 Feet [phone]) Assistive device: Rollator (4 wheels) Gait Pattern/deviations: Wide base of support, Drifts right/left, Decreased stride length, Step-through pattern, Trunk flexed Gait velocity: decreased     General Gait Details: 4 standing rest breaks. Cues for proper brake management with rollator.   Stairs             Wheelchair Mobility     Tilt Bed    Modified Rankin (Stroke Patients Only)       Balance Overall balance assessment: Needs assistance Sitting-balance support: Feet supported, No upper extremity supported Sitting balance-Leahy Scale: Good     Standing balance support: Single extremity supported, During functional activity Standing balance-Leahy Scale: Fair Standing balance comment: completed peri care while standing supported unilaterally at [employer] Communication Communication: No apparent difficulties  Cognition Arousal: Alert Behavior During Therapy: WFL for tasks assessed/performed   PT - Cognitive impairments: No apparent impairments                         Following commands: Intact      Cueing Cueing Techniques: Verbal cues  Exercises      General Comments General comments (skin integrity, edema, etc.): VSS on RA      Pertinent Vitals/Pain Pain Assessment Pain Assessment: No/denies pain    Home Living                          Prior Function            PT Goals (current goals can now be found in the  care plan section) Progress towards PT goals: Progressing toward goals    Frequency    Min 1X/week      PT Plan      Co-evaluation              AM-PAC PT "6 Clicks" Mobility   Outcome Measure  Help needed turning from your back to your side while in a flat bed without using bedrails?: A Little Help needed moving from lying on your back to sitting on the side of a flat bed without using bedrails?: A Little Help needed moving to and from a bed to a chair (including a wheelchair)?: A Little Help needed standing up from a chair using your arms (e.g., wheelchair or bedside chair)?: A Little Help needed to walk in hospital room?: A Little Help needed climbing 3-5 steps with a railing? : Total 6 Click Score: 16    End of Session Equipment Utilized During Treatment: Gait belt Activity Tolerance: Patient tolerated treatment well Patient left: in chair;with call bell/phone within reach Nurse Communication: Mobility status PT Visit Diagnosis: Other abnormalities of gait and mobility (R26.89)     Time: 4098-1191 PT Time Calculation (min) (ACUTE ONLY): 26 min  Charges:    $Gait Training: 23-37 mins PT General Charges $$ ACUTE PT VISIT: 1 Visit                     [name], PT, DPT Acute Rehab Services [phone]    [name] 11/21/2023, 1:53 PM

## 2023-11-21 NOTE — Plan of Care (Signed)
  Problem: Fluid Volume: Goal: Ability to maintain a balanced intake and output will improve Outcome: Progressing   Problem: Health Behavior/Discharge Planning: Goal: Ability to identify and utilize available resources and services will improve Outcome: Progressing   Problem: Health Behavior/Discharge Planning: Goal: Ability to manage health-related needs will improve Outcome: Progressing

## 2023-11-21 NOTE — Progress Notes (Signed)
 Progress Note  19 Days Post-Op  Subjective: Sitting up eating breakfast. No further abdominal pain like a few days ago. Ostomy has been productive  Objective: Vital signs in last 24 hours: Temp:  [98 F (36.7 C)-98.8 F (37.1 C)] 98.8 F (37.1 C) (02/27 0800) Pulse Rate:  [69-80] 75 (02/27 0800) Resp:  [0-28] 17 (02/27 0800) BP: (94-174)/(50-73) 107/63 (02/27 0800) SpO2:  [96 %-100 %] 96 % (02/27 0800) Weight:  [88.4 kg-91 kg] 91 kg (02/27 0452) Last BM Date : 11/20/23  Intake/Output from previous day: 02/26 0701 - 02/27 0700 In: 200 [P.O.:200] Out: 1  Intake/Output this shift: Total I/O In: 360 [P.O.:360] Out: -   PE: Abd: soft, appropriately ttp, midline wound with vac in place with good suction - replaced with WOC RN this am. See below. Colostomy with stoma pink and viable. Soft stool in bag     Lab Results:  Recent Labs    11/19/23 0834 11/20/23 0820  WBC 10.2 10.3  HGB 10.1* 9.3*  HCT 31.1* 29.8*  PLT 227 232   BMET Recent Labs    11/19/23 0834 11/20/23 0820  NA 134* 133*  K 3.5 3.7  CL 97* 96*  CO2 26 24  GLUCOSE 88 164*  BUN 17 24*  CREATININE 6.15* 7.84*  CALCIUM 8.2* 8.0*   PT/INR No results for input(s): "LABPROT", "INR" in the last 72 hours. CMP     Component Value Date/Time   NA 133 (L) 11/20/2023 0820   NA 137 02/28/2021 1548   K 3.7 11/20/2023 0820   CL 96 (L) 11/20/2023 0820   CO2 24 11/20/2023 0820   GLUCOSE 164 (H) 11/20/2023 0820   BUN 24 (H) 11/20/2023 0820   BUN 33 (H) 02/28/2021 1548   CREATININE 7.84 (H) 11/20/2023 0820   CREATININE 3.38 (H) 12/26/2015 1347   CALCIUM 8.0 (L) 11/20/2023 0820   PROT 5.8 (L) 11/11/2023 0327   PROT 7.0 11/07/2017 1414   ALBUMIN 2.2 (L) 11/20/2023 0820   ALBUMIN 4.0 11/07/2017 1414   AST 23 11/11/2023 0327   ALT 17 11/11/2023 0327   ALKPHOS 39 11/11/2023 0327   BILITOT 0.5 11/11/2023 0327   BILITOT 0.2 11/07/2017 1414   GFRNONAA 5 (L) 11/20/2023 0820   GFRNONAA 13 (L) 01/10/2015  1428   GFRAA 21 (L) 12/21/2019 1802   GFRAA 16 (L) 01/10/2015 1428   Lipase     Component Value Date/Time   LIPASE 26 10/27/2023 1936       Studies/Results: No results found.    Anti-infectives: Anti-infectives (From admission, onward)    Start     Dose/Rate Route Frequency Ordered Stop   11/08/23 1200  erythromycin (EES) 400 MG/5ML suspension 400 mg  Status:  Discontinued        400 mg Oral Every 6 hours 11/08/23 0815 11/12/23 1111   11/06/23 0815  piperacillin-tazobactam (ZOSYN) IVPB 2.25 g  Status:  Discontinued        2.25 g 100 mL/hr over 30 Minutes Intravenous Every 8 hours 11/06/23 0729 11/14/23 0951   11/01/23 1415  piperacillin-tazobactam (ZOSYN) IVPB 2.25 g        2.25 g 100 mL/hr over 30 Minutes Intravenous Every 8 hours 11/01/23 1316 11/06/23 0602   10/29/23 1100  piperacillin-tazobactam (ZOSYN) IVPB 2.25 g  Status:  Discontinued        2.25 g 100 mL/hr over 30 Minutes Intravenous Every 8 hours 10/29/23 1006 11/01/23 1016   10/28/23 0600  piperacillin-tazobactam (ZOSYN) IVPB  2.25 g  Status:  Discontinued        2.25 g 100 mL/hr over 30 Minutes Intravenous Every 8 hours 10/27/23 2224 10/27/23 2235   10/28/23 0600  ceFEPIme (MAXIPIME) 1 g in sodium chloride 0.9 % 100 mL IVPB  Status:  Discontinued        1 g 200 mL/hr over 30 Minutes Intravenous Every 24 hours 10/27/23 2235 10/29/23 0954   10/28/23 0600  metroNIDAZOLE (FLAGYL) IVPB 500 mg  Status:  Discontinued        500 mg 100 mL/hr over 60 Minutes Intravenous Every 12 hours 10/27/23 2235 10/29/23 0954   10/27/23 2130  piperacillin-tazobactam (ZOSYN) IVPB 3.375 g        3.375 g 100 mL/hr over 30 Minutes Intravenous  Once 10/27/23 2128 10/27/23 2320        Assessment/Plan  POD 19, s/p Hartmann's by Dr. Freida Busman 11/02/23 forSigmoid diverticulitis with perforation  - WBC down to 10 K.  Off abx. yesterday - hgb 9.3 from 10.1 yesterday - VAC in place with no evidence of bleeding. Changing M/R - renal diet and  tolerating - ostomy output improved and abdominal pain improved - therapies for mobilization, rec CIR - WOC for colostomy care - increased miralax to BID 2/26. Continue for now  Surgical team will remain available as needed over the weekend   FEN: renal diet, protein supplements VTE: Eliquis ID: cefepime/flagyl 2/3>2/4; Zosyn 2/4>2/20   - per [facility] -  PAF  HTN HLD ESRD - on HD CAD T2DM   LOS: 25 days     Eric Form, [facility] 11/21/2023, 10:57 AM Please see Amion for pager number during day hours 7:00am-4:30pm

## 2023-11-21 NOTE — Progress Notes (Signed)
 Case discussed with CSW earlier today. Contacted [facility] to be advised that pt will not be starting at clinic tomorrow. Will coordinate start date with clinic once d/c date is known. Will assist as needed.   Olivia Canter Renal Navigator [phone]

## 2023-11-21 NOTE — Progress Notes (Signed)
 Palliative Medicine Progress Note   Patient Name: [name]       Date: 11/21/2023 DOB: Feb 06, 1956  Age: 68 y.o. MRN#: [id number] Attending Physician: Maretta Bees, MD Primary Care Physician: Hillery Aldo, NP Admit Date: 10/27/2023   HPI/Patient Profile: 68 y.o. female  with past medical history of CKD stage V, A-fib on Eliquis, DM type 2, CAD, and HTN who presented to the ED on 10/27/2023 with abdominal pain and was found to have acute diverticulitis with contained perforation involving the descending colon.  Hospital course complicated by AKI on CKD stage V, which progressed to ESRD.     Palliative Medicine was consulted for goals of care and medical decision making.    Significant Events: 2/5 - Initial palliative consult. Patient did not wish to start to dialysis in the setting of progressive renal failure, even if the outcome was terminal.  2/6 - CT abdomen/pelvis showed worsening diverticulitis of descending colon with perforation and increasing  free air and suspected oral contrast extravasation in the mid left abdomen 2/7 - worsening AKI, transition to full comfort measures.  However, with arrival of multiple family members patient ultimately revoked comfort care opting for full code and full scope.  Underwent first HD treatment. 2/8 -underwent exploratory laparotomy with resection and colostomy    Subjective: Chart reviewed including labs, imaging, progress notes, medications, and orders.  I met with patient at bedside as a follow-up for palliative medicine needs and emotional support.  She is OOB to the recliner. She reports feeling good.   We reviewed her hospital course and current clinical condition. Patient expresses relief that her condition has improved. Her goal is  for continued treatment and recovery to the extent possible. She is looking forward to going home after she completes rehab.   Patient expresses appreciation for ongoing palliative support.    Objective:  Physical Exam Vitals reviewed.  Constitutional:      General: She is not in acute distress.    Comments: Chronically ill-appearing  Abdominal:     Comments: Abdominal incision with wound VAC ostomy  Neurological:     Mental Status: She is alert.             Palliative Medicine Assessment & Plan   Assessment: Principal Problem:   Diverticulitis of large intestine with complication Active  Problems:   Chronic kidney disease, stage 5 (HCC)    Recommendations/Plan: Continue current supportive interventions Goal of care is for medical stabilization and recovery  PMT will continue to follow  Code Status: DNR/DNI  Prognosis:  Unable to determine  Discharge Planning: SNF/rehab   Thank you for allowing the Palliative Medicine Team to assist in the care of this patient.   Time: 30 minutes   Merry Proud, NP   Please contact Palliative Medicine Team phone at [phone] for questions and concerns.  For individual providers, please see AMION.

## 2023-11-22 LAB — RENAL FUNCTION PANEL
Albumin: 2.1 g/dL — ABNORMAL LOW (ref 3.5–5.0)
Anion gap: 9 (ref 5–15)
BUN: 24 mg/dL — ABNORMAL HIGH (ref 8–23)
CO2: 25 mmol/L (ref 22–32)
Calcium: 7.8 mg/dL — ABNORMAL LOW (ref 8.9–10.3)
Chloride: 96 mmol/L — ABNORMAL LOW (ref 98–111)
Creatinine, Ser: 7.31 mg/dL — ABNORMAL HIGH (ref 0.44–1.00)
GFR, Estimated: 6 mL/min — ABNORMAL LOW (ref >60)
Glucose, Bld: 162 mg/dL — ABNORMAL HIGH (ref 70–99)
Phosphorus: 4.8 mg/dL — ABNORMAL HIGH (ref 2.5–4.6)
Potassium: 3.8 mmol/L (ref 3.5–5.1)
Sodium: 130 mmol/L — ABNORMAL LOW (ref 135–145)

## 2023-11-22 LAB — CBC
HCT: 28.7 % — ABNORMAL LOW (ref 36.0–46.0)
Hemoglobin: 9.1 g/dL — ABNORMAL LOW (ref 12.0–15.0)
MCH: 27 pg (ref 26.0–34.0)
MCHC: 31.7 g/dL (ref 30.0–36.0)
MCV: 85.2 fL (ref 80.0–100.0)
Platelets: 191 10*3/uL (ref 150–400)
RBC: 3.37 MIL/uL — ABNORMAL LOW (ref 3.87–5.11)
RDW: 16.5 % — ABNORMAL HIGH (ref 11.5–15.5)
WBC: 8.9 10*3/uL (ref 4.0–10.5)
nRBC: 0 % (ref 0.0–0.2)

## 2023-11-22 LAB — GLUCOSE, CAPILLARY
Glucose-Capillary: 157 mg/dL — ABNORMAL HIGH (ref 70–99)
Glucose-Capillary: 165 mg/dL — ABNORMAL HIGH (ref 70–99)

## 2023-11-22 MED ORDER — HEPARIN SODIUM (PORCINE) 1000 UNIT/ML IJ SOLN
INTRAMUSCULAR | Status: AC
  Filled 2023-11-22: qty 4

## 2023-11-22 NOTE — Procedures (Signed)
 I was present at this dialysis session. I have reviewed the session itself and made appropriate changes.   Filed Weights   11/20/23 1215 11/21/23 0452 11/22/23 0802  Weight: 88.4 kg 91 kg 85.6 kg    Recent Labs  Lab 11/22/23 0835  NA 130*  K 3.8  CL 96*  CO2 25  GLUCOSE 162*  BUN 24*  CREATININE 7.31*  CALCIUM 7.8*  PHOS 4.8*    Recent Labs  Lab 11/19/23 0834 11/20/23 0820 11/22/23 0835  WBC 10.2 10.3 8.9  HGB 10.1* 9.3* 9.1*  HCT 31.1* 29.8* 28.7*  MCV 84.1 85.6 85.2  PLT 227 232 191    Scheduled Meds:  amLODipine  5 mg Oral Daily   apixaban  5 mg Oral BID   brimonidine  1 drop Both Eyes BID   carvedilol  6.25 mg Oral BID WC   Chlorhexidine Gluconate Cloth  6 each Topical Q0600   [START ON 11/25/2023] darbepoetin (ARANESP) injection - DIALYSIS  100 mcg Subcutaneous Q Mon-1800   docusate sodium  100 mg Oral BID   insulin aspart  0-15 Units Subcutaneous TID WC   insulin aspart  0-5 Units Subcutaneous QHS   insulin glargine  12 Units Subcutaneous Q24H   latanoprost  1 drop Both Eyes QHS   mouth rinse  15 mL Mouth Rinse 4 times per day   pantoprazole  40 mg Oral BID   polyethylene glycol  17 g Oral BID   senna  1 tablet Oral Daily   sodium chloride flush  10-40 mL Intracatheter Q12H   Continuous Infusions: PRN Meds:.acetaminophen **OR** acetaminophen, albuterol, hydrALAZINE, HYDROmorphone (DILAUDID) injection, melatonin, menthol-cetylpyridinium, ondansetron (ZOFRAN) IV, mouth rinse, oxyCODONE   Anthony Sar, MD [facility] 11/22/2023, 11:41 AM

## 2023-11-22 NOTE — Progress Notes (Signed)
 PROGRESS NOTE        PATIENT DETAILS Name: [name] Age: 68 y.o. Sex: female Date of Birth: 07/09/1956 Admit Date: 10/27/2023 Admitting Physician Dolly Rias, MD ZOX:[id number], Devonne Doughty, NP  Brief Summary: Patient is a 68 y.o.  female with history of CKD 5, A-fib on Eliquis, CAD-who presented with abdominal pain-found to have acute diverticulitis with contained perforation involving the descending colon.  Hospital course complicated by worsening AKI-thought to have progressed to ESRD-requiring initiation of HD.  Unfortunately-even with conservative measures/antibiotics-she continued to worsen and ended up with exploratory laparotomy/Hartman's procedure on 2/8.  Significant events: 2/2>> admit to [facility] 2/6>> CT abdomen worsening diverticulitis with perforation 2/7>> transitioned to full comfort measures-as with worsening AKI-however multiple family members arrived-comfort care revoked-now full code-full scope of treatment including HD/laparotomy with colostomy.  General surgery/nephrology reconsulted.  IR placed [facility] and patient underwent first hemodialysis.  2/8>> remains n.p.o-colectomy/colostomy later today. 2/20>> wound VAC reapplied  Significant studies: 2/2>> CT abdomen/pelvis: Acute diverticulitis-descending colon with contained perforation 2/6>> CT abdomen/pelvis: Worsening diverticulitis of descending colon with perforation and increasing failure-suspected oral contrast extravasation left abdomen.  Significant microbiology data: 2/2>> COVID/influenza/RSV PCR: Negative  Procedures: 2/8>> by Dr. Freida Busman underwent exploratory laparotomy with Hartman's resection for perforated diverticulitis with diffuse peritonitis, colostomy in place. 2/13 - R IJ Trialysis catheter - IR 2/14 -left IJ Central line placed by IR for TNA(removed on 2/22) 11/12/2023. R.internal jugular tunneled HD catheter placement by  IR  Consults: Surgery Renal Palliative IR  Subjective:  No complaint-seen earlier during dialysis-awaiting SNF bed.  Objective: Vitals: Blood pressure (!) 110/56, pulse 69, temperature 98.3 F (36.8 C), resp. rate 14, height 5' 7.01" (1.702 m), weight 85.6 kg, SpO2 98%.   Exam: Gen Exam:Alert awake-not in any distress HEENT:atraumatic, normocephalic Chest: B/L clear to auscultation anteriorly CVS:S1S2 regular Abdomen:soft non tender, non distended-wound VAC in place. Extremities:no edema Neurology: Non focal Skin: no rash  Assessment/Plan: Acute diverticulitis with contained perforation of the descending colon  S/p Hartman's procedure on 2/8-completed a course of antibiotics-off TNA-appetite improving-tolerating advancement in diet.  Wound VAC reapplied 2/20-General Surgery following and directing postop care.  AKI on CKD 5 with progression to ESRD AKI likely hemodynamically mediated-has now likely progressed to ESRD.   HD initiated via right IJ temporary catheter, switched to right IJ trialysis catheter by IR on 11/07/2023>> tunneled HD catheter right IJ on 11/12/2023 by IR. CLIP in process, renal would like to hold on permanent access creation for now given her overall clinical picture.  CAD Currently no anginal symptoms Suspect not on antiplatelets as on anticoagulation with Eliquis/heparin Continue beta-blocker On Repatha injections as an outpatient.  PAF Sinus rhythm Continue Coreg Currently on Eliquis  Anemia of acute illness  Anemia of end-stage renal disease Multifactorial anemia-did 3 units of PRBC-last transfused on 2/19-Hb currently stable. Iron/Aranesp per nephrology service.   Hb currently stable-follow CBC closely.  HTN BP stable Continue Norvasc/Coreg  DM-2 (A1c 6.6 on 2/3) CBGs stable Semglee 12 units daily+ SSI.   Recent Labs    11/21/23 1233 11/21/23 1802 11/21/23 2114  GLUCAP 219* 166* 170*    Palliative care/goals of  care Palliative input greatly appreciated Note-initially did not want to pursue surgery/hemodialysis-comfort measures was contemplated-comfort measures was revoked-patient was a full code-but after continued discussion-she is now a DNR.  Class 1 Obesity: Estimated body mass index  is 29.55 kg/m as calculated from the following:   Height as of this encounter: 5' 7.01" (1.702 m).   Weight as of this encounter: 85.6 kg.    Code status:   Code Status: Limited: Do not attempt resuscitation (DNR) -DNR-LIMITED -Do Not Intubate/DNI    DVT Prophylaxis: Place and maintain sequential compression device Start: 11/06/23 0613 apixaban (ELIQUIS) tablet 5 mg IV Heparin  Family Communication: Discussed with patient, all her questions answered.  Disposition Plan: Status is: Inpatient-due to severity of illness  Disposition: SNF-awaiting placement.   Diet: Diet Order             Diet regular Room service appropriate? Yes; Fluid consistency: Thin  Diet effective now                   MEDICATIONS: Scheduled Meds:  amLODipine  5 mg Oral Daily   apixaban  5 mg Oral BID   brimonidine  1 drop Both Eyes BID   carvedilol  6.25 mg Oral BID WC   Chlorhexidine Gluconate Cloth  6 each Topical Q0600   [START ON 11/25/2023] darbepoetin (ARANESP) injection - DIALYSIS  100 mcg Subcutaneous Q Mon-1800   docusate sodium  100 mg Oral BID   insulin aspart  0-15 Units Subcutaneous TID WC   insulin aspart  0-5 Units Subcutaneous QHS   insulin glargine  12 Units Subcutaneous Q24H   latanoprost  1 drop Both Eyes QHS   mouth rinse  15 mL Mouth Rinse 4 times per day   pantoprazole  40 mg Oral BID   polyethylene glycol  17 g Oral BID   senna  1 tablet Oral Daily   sodium chloride flush  10-40 mL Intracatheter Q12H   Continuous Infusions:   PRN Meds:.acetaminophen **OR** acetaminophen, albuterol, hydrALAZINE, HYDROmorphone (DILAUDID) injection, melatonin, menthol-cetylpyridinium, ondansetron (ZOFRAN) IV,  mouth rinse, oxyCODONE   I have personally reviewed following labs and imaging studies  LABORATORY DATA:   Data Review:    Recent Labs  Lab 11/18/23 0551 11/19/23 0834 11/20/23 0820 11/22/23 0835  WBC 10.7* 10.2 10.3 8.9  HGB 9.1* 10.1* 9.3* 9.1*  HCT 28.8* 31.1* 29.8* 28.7*  PLT 338 227 232 191  MCV 85.0 84.1 85.6 85.2  MCH 26.8 27.3 26.7 27.0  MCHC 31.6 32.5 31.2 31.7  RDW 16.9* 16.8* 16.6* 16.5*    Recent Labs  Lab 11/18/23 0551 11/19/23 0834 11/20/23 0820 11/22/23 0835  NA 135 134* 133* 130*  K 3.5 3.5 3.7 3.8  CL 100 97* 96* 96*  CO2 22 26 24 25   ANIONGAP 13 11 13 9   GLUCOSE 118* 88 164* 162*  BUN 30* 17 24* 24*  CREATININE 8.20* 6.15* 7.84* 7.31*  ALBUMIN 2.1* 2.2* 2.2* 2.1*  PHOS 4.8* 4.3 5.6* 4.8*  CALCIUM 7.9* 8.2* 8.0* 7.8*      Recent Labs  Lab 11/18/23 0551 11/19/23 0834 11/20/23 0820 11/22/23 0835  CALCIUM 7.9* 8.2* 8.0* 7.8*    --------------------------------------------------------------------------------------------------------------- Lab Results  Component Value Date   CHOL 134 12/21/2019   HDL 49 12/21/2019   LDLCALC 69 12/21/2019   TRIG 148 11/11/2023   CHOLHDL 2.7 12/21/2019    Lab Results  Component Value Date   HGBA1C 6.6 (H) 10/28/2023      Micro Results No results found for this or any previous visit (from the past 240 hours).    Radiology Reports DG Abd Portable 1V Result Date: 11/19/2023 CLINICAL DATA:  Abdominal pain.  Colostomy back in place. EXAM: PORTABLE  ABDOMEN - 1 VIEW COMPARISON:  CT dated 11/06/2023. FINDINGS: No bowel dilatation or evidence of obstruction. There is paucity of bowel air. Anastomotic staple line noted in the right lower quadrant. No free air. The osseous structures are intact. The soft tissues are unremarkable. IMPRESSION: Nonobstructive bowel gas pattern. Electronically Signed   By: Elgie Collard M.D.   On: 11/19/2023 09:59   IR Fluoro Guide CV Line Right Result Date:  11/12/2023 CLINICAL DATA:  End-stage renal disease and status post placement of non tunneled temporary dialysis catheter on 11/01/2023. The patient now requires placement of a tunneled dialysis catheter for further longer-term hemodialysis. EXAM: TUNNELED CENTRAL VENOUS HEMODIALYSIS CATHETER PLACEMENT WITH ULTRASOUND AND FLUOROSCOPIC GUIDANCE ANESTHESIA/SEDATION: Moderate (conscious) sedation was employed during this procedure. A total of Versed 1.0 mg and Fentanyl 50 mcg was administered intravenously. Moderate Sedation Time: 16 minutes. The patient's level of consciousness and vital signs were monitored continuously by radiology nursing throughout the procedure under my direct supervision. MEDICATIONS: The patient is receiving IV Rocephin every 8 hours in the hospital and did not receive additional antibiotics for the procedure. FLUOROSCOPY: 24 seconds.  2.3 mGy. PROCEDURE: The procedure, risks, benefits, and alternatives were explained to the patient. Questions regarding the procedure were encouraged and answered. The patient understands and consents to the procedure. A timeout was performed prior to initiating the procedure. A non tunneled temporary right-sided dialysis catheter was removed prior to the procedure and pressure held at the exit site. The right neck and chest were prepped with chlorhexidine in a sterile fashion, and a sterile drape was applied covering the operative field. Maximum barrier sterile technique with sterile gowns and gloves were used for the procedure. Local anesthesia was provided with 1% lidocaine. Ultrasound was used to confirm patency of the right internal jugular vein. An ultrasound image was saved and recorded. After creating a small venotomy incision, a 21 gauge needle was advanced into the right internal jugular vein under direct, real-time ultrasound guidance. Ultrasound image documentation was performed. After securing guidewire access, an 8 Fr dilator was placed. A J-wire was  kinked to measure appropriate catheter length. A Palindrome tunneled hemodialysis catheter measuring 19 cm from tip to cuff was chosen for placement. This was tunneled in a retrograde fashion from the chest wall to the venotomy incision. At the venotomy, serial dilatation was performed and a 15 Fr peel-away sheath was placed over a guidewire. The catheter was then placed through the sheath and the sheath removed. Final catheter positioning was confirmed and documented with a fluoroscopic spot image. The catheter was aspirated, flushed with saline, and injected with appropriate volume heparin dwells. The venotomy incision was closed with subcuticular 4-0 Vicryl. Dermabond was applied to the incision. The catheter exit site was secured with 0-Prolene retention sutures. COMPLICATIONS: None.  No pneumothorax. FINDINGS: After catheter placement, the tip lies in the right atrium. The catheter aspirates normally and is ready for immediate use. IMPRESSION: Placement of tunneled hemodialysis catheter via new puncture of the right internal jugular vein after removal of the non tunneled catheter. The new catheter tip lies in the right atrium. The catheter is ready for immediate use. Electronically Signed   By: Irish Lack M.D.   On: 11/12/2023 10:45   IR US Guide Vasc Access Right Result Date: 11/12/2023 CLINICAL DATA:  End-stage renal disease and status post placement of non tunneled temporary dialysis catheter on 11/01/2023. The patient now requires placement of a tunneled dialysis catheter for further longer-term hemodialysis. EXAM: TUNNELED CENTRAL  VENOUS HEMODIALYSIS CATHETER PLACEMENT WITH ULTRASOUND AND FLUOROSCOPIC GUIDANCE ANESTHESIA/SEDATION: Moderate (conscious) sedation was employed during this procedure. A total of Versed 1.0 mg and Fentanyl 50 mcg was administered intravenously. Moderate Sedation Time: 16 minutes. The patient's level of consciousness and vital signs were monitored continuously by radiology  nursing throughout the procedure under my direct supervision. MEDICATIONS: The patient is receiving IV Rocephin every 8 hours in the hospital and did not receive additional antibiotics for the procedure. FLUOROSCOPY: 24 seconds.  2.3 mGy. PROCEDURE: The procedure, risks, benefits, and alternatives were explained to the patient. Questions regarding the procedure were encouraged and answered. The patient understands and consents to the procedure. A timeout was performed prior to initiating the procedure. A non tunneled temporary right-sided dialysis catheter was removed prior to the procedure and pressure held at the exit site. The right neck and chest were prepped with chlorhexidine in a sterile fashion, and a sterile drape was applied covering the operative field. Maximum barrier sterile technique with sterile gowns and gloves were used for the procedure. Local anesthesia was provided with 1% lidocaine. Ultrasound was used to confirm patency of the right internal jugular vein. An ultrasound image was saved and recorded. After creating a small venotomy incision, a 21 gauge needle was advanced into the right internal jugular vein under direct, real-time ultrasound guidance. Ultrasound image documentation was performed. After securing guidewire access, an 8 Fr dilator was placed. A J-wire was kinked to measure appropriate catheter length. A Palindrome tunneled hemodialysis catheter measuring 19 cm from tip to cuff was chosen for placement. This was tunneled in a retrograde fashion from the chest wall to the venotomy incision. At the venotomy, serial dilatation was performed and a 15 Fr peel-away sheath was placed over a guidewire. The catheter was then placed through the sheath and the sheath removed. Final catheter positioning was confirmed and documented with a fluoroscopic spot image. The catheter was aspirated, flushed with saline, and injected with appropriate volume heparin dwells. The venotomy incision was  closed with subcuticular 4-0 Vicryl. Dermabond was applied to the incision. The catheter exit site was secured with 0-Prolene retention sutures. COMPLICATIONS: None.  No pneumothorax. FINDINGS: After catheter placement, the tip lies in the right atrium. The catheter aspirates normally and is ready for immediate use. IMPRESSION: Placement of tunneled hemodialysis catheter via new puncture of the right internal jugular vein after removal of the non tunneled catheter. The new catheter tip lies in the right atrium. The catheter is ready for immediate use. Electronically Signed   By: Irish Lack M.D.   On: 11/12/2023 10:45   IR US Guide Vasc Access Left Result Date: 11/08/2023 INDICATION: ESRD.  IV antibiotics. EXAM: ULTRASOUND AND FLUOROSCOPIC GUIDED PLACEMENT OF TUNNELED CENTRAL VENOUS "Powerline" CATHETER MEDICATIONS: The patient was on scheduled IV antibiotics. ANESTHESIA/SEDATION: Local anesthetic was administered. The patient was continuously monitored during the procedure by the interventional radiology nurse under my direct supervision. FLUOROSCOPY TIME:  Fluoroscopic dose; 3.8 mGy COMPLICATIONS: None immediate. PROCEDURE: Informed written consent was obtained from the patient and/or patient's representative after a discussion of the risks, benefits, and alternatives to treatment. Questions regarding the procedure were encouraged and answered. The LEFT neck and chest were prepped with chlorhexidine in a sterile fashion, and a sterile drape was applied covering the operative field. Maximum barrier sterile technique with sterile gowns and gloves were used for the procedure. A timeout was performed prior to the initiation of the procedure. After the overlying soft tissues were anesthetized, a small  venotomy incision was created and a micropuncture kit was utilized to access the internal jugular vein. Real-time ultrasound guidance was utilized for vascular access including the acquisition of a permanent  ultrasound image documenting patency of the accessed vessel. The microwire was utilized to measure appropriate catheter length. The micropuncture sheath was exchanged for a peel-away sheath over a guidewire. A 5 Fr dual lumen tunneled central venous catheter measuring 33 cm was tunneled in a retrograde fashion from the anterior chest wall to the venotomy incision. The catheter was then placed through the peel-away sheath with tip ultimately positioned at the superior caval-atrial junction. Final catheter positioning was confirmed and documented with a spot radiographic image. The catheter aspirates and flushes normally. The catheter was flushed with appropriate volume heparin dwells. The catheter exit site was secured with a 3-0 Ethilon retention suture. The venotomy incision was closed with Dermabond. Dressings were applied. The patient tolerated the procedure well without immediate post procedural complication. FINDINGS: After catheter placement, the tip lies within the superior apect of the right atrium. The catheter aspirates and flushes normally and is ready for immediate use. IMPRESSION: Successful placement of 33 cm dual lumen tunneled "Powerline" central venous catheter via the LEFT internal jugular vein The tip of the catheter is positioned within the proximal RIGHT atrium. The catheter is ready for immediate use. Roanna Banning, MD [facility] Radiology Electronically Signed   By: Roanna Banning M.D.   On: 11/08/2023 18:10   IR Fluoro Guide CV Line Left Result Date: 11/08/2023 INDICATION: ESRD.  IV antibiotics. EXAM: ULTRASOUND AND FLUOROSCOPIC GUIDED PLACEMENT OF TUNNELED CENTRAL VENOUS "Powerline" CATHETER MEDICATIONS: The patient was on scheduled IV antibiotics. ANESTHESIA/SEDATION: Local anesthetic was administered. The patient was continuously monitored during the procedure by the interventional radiology nurse under my direct supervision. FLUOROSCOPY  TIME:  Fluoroscopic dose; 3.8 mGy COMPLICATIONS: None immediate. PROCEDURE: Informed written consent was obtained from the patient and/or patient's representative after a discussion of the risks, benefits, and alternatives to treatment. Questions regarding the procedure were encouraged and answered. The LEFT neck and chest were prepped with chlorhexidine in a sterile fashion, and a sterile drape was applied covering the operative field. Maximum barrier sterile technique with sterile gowns and gloves were used for the procedure. A timeout was performed prior to the initiation of the procedure. After the overlying soft tissues were anesthetized, a small venotomy incision was created and a micropuncture kit was utilized to access the internal jugular vein. Real-time ultrasound guidance was utilized for vascular access including the acquisition of a permanent ultrasound image documenting patency of the accessed vessel. The microwire was utilized to measure appropriate catheter length. The micropuncture sheath was exchanged for a peel-away sheath over a guidewire. A 5 Fr dual lumen tunneled central venous catheter measuring 33 cm was tunneled in a retrograde fashion from the anterior chest wall to the venotomy incision. The catheter was then placed through the peel-away sheath with tip ultimately positioned at the superior caval-atrial junction. Final catheter positioning was confirmed and documented with a spot radiographic image. The catheter aspirates and flushes normally. The catheter was flushed with appropriate volume heparin dwells. The catheter exit site was secured with a 3-0 Ethilon retention suture. The venotomy incision was closed with Dermabond. Dressings were applied. The patient tolerated the procedure well without immediate post procedural complication. FINDINGS: After catheter placement, the tip lies within the superior apect of the right atrium. The catheter aspirates and flushes normally and is ready  for immediate use. IMPRESSION: Successful placement of 33 cm dual lumen tunneled "Powerline" central venous catheter via the LEFT internal jugular vein The tip of the catheter is positioned within the proximal RIGHT atrium. The catheter is ready for immediate use. Roanna Banning, MD [facility] Radiology Electronically Signed   By: Roanna Banning M.D.   On: 11/08/2023 18:10   VAS [place] LOWER EXTREMITY VENOUS (DVT) Result Date: 11/07/2023  Lower Venous DVT Study Patient Name:  KUMIKO FISHMAN [facility]  Date of Exam:   11/07/2023 Medical Rec #: [id number]          Accession #:    [id number] Date of Birth: May 19, 1956          Patient Gender: F Patient Age:   46 years Exam Location:  [place] Procedure:      VAS [place] LOWER EXTREMITY VENOUS (DVT) Referring Phys: Tresa Endo OSBORNE --------------------------------------------------------------------------------  Indications: Pain, stroke, and A-fib, anemia.  Comparison Study: No prior exam. Performing Technologist: Fernande Bras  Examination Guidelines: A complete evaluation includes B-mode imaging, spectral Doppler, color Doppler, and power Doppler as needed of all accessible portions of each vessel. Bilateral testing is considered an integral part of a complete examination. Limited examinations for reoccurring indications may be performed as noted. The reflux portion of the exam is performed with the patient in reverse Trendelenburg.  +---------+---------------+---------+-----------+----------+--------------+ RIGHT    CompressibilityPhasicitySpontaneityPropertiesThrombus Aging +---------+---------------+---------+-----------+----------+--------------+ CFV      Full           Yes      Yes                                 +---------+---------------+---------+-----------+----------+--------------+ SFJ      Full           Yes      Yes                                  +---------+---------------+---------+-----------+----------+--------------+ FV Prox  Full                                                        +---------+---------------+---------+-----------+----------+--------------+ FV Mid   Full                                                        +---------+---------------+---------+-----------+----------+--------------+ FV DistalFull                                                        +---------+---------------+---------+-----------+----------+--------------+ PFV      Full                                                        +---------+---------------+---------+-----------+----------+--------------+  POP      Full           Yes      Yes                                 +---------+---------------+---------+-----------+----------+--------------+ PTV      Full                                                        +---------+---------------+---------+-----------+----------+--------------+ PERO     Full                                                        +---------+---------------+---------+-----------+----------+--------------+   +---------+---------------+---------+-----------+----------+--------------+ LEFT     CompressibilityPhasicitySpontaneityPropertiesThrombus Aging +---------+---------------+---------+-----------+----------+--------------+ CFV      Full           Yes      Yes                                 +---------+---------------+---------+-----------+----------+--------------+ SFJ      Full           Yes      Yes                                 +---------+---------------+---------+-----------+----------+--------------+ FV Prox  Full                                                        +---------+---------------+---------+-----------+----------+--------------+ FV Mid   Full                                                         +---------+---------------+---------+-----------+----------+--------------+ FV DistalFull                                                        +---------+---------------+---------+-----------+----------+--------------+ PFV      Full                                                        +---------+---------------+---------+-----------+----------+--------------+ POP      Full           Yes      Yes                                 +---------+---------------+---------+-----------+----------+--------------+  PTV      Full                                                        +---------+---------------+---------+-----------+----------+--------------+ PERO     Full                                                        +---------+---------------+---------+-----------+----------+--------------+     Summary: BILATERAL: - No evidence of deep vein thrombosis seen in the lower extremities, bilaterally. -No evidence of popliteal cyst, bilaterally.   *See table(s) above for measurements and observations. Electronically signed by Heath Lark on 11/07/2023 at 4:05:14 PM.    Final    CT ABDOMEN PELVIS WO CONTRAST Result Date: 11/06/2023 CLINICAL DATA:  Diverticulitis, complication suspected EXAM: CT ABDOMEN AND PELVIS WITHOUT CONTRAST TECHNIQUE: Multidetector CT imaging of the abdomen and pelvis was performed following the standard protocol without IV contrast. RADIATION DOSE REDUCTION: This exam was performed according to the departmental dose-optimization program which includes automated exposure control, adjustment of the mA and/or kV according to patient size and/or use of iterative reconstruction technique. COMPARISON:  10/31/2023 FINDINGS: Lower chest: Volume loss and opacity in the lower lungs consistent with atelectasis. There is a central line with tip at the upper right atrium. Coronary atherosclerosis. Hepatobiliary: No focal liver abnormality. Calcific in the intermediate density  in the gallbladder attributed to stones. No biliary dilatation or pericholecystic edema. Pancreas: Unremarkable. Spleen: Unremarkable. Adrenals/Urinary Tract: Negative adrenals. No hydronephrosis or stone. Renal cortical thinning and lobulation at the left upper pole. 3 mm stone at the upper pole left kidney. The urinary bladder is collapsed around a Foley catheter. Stomach/Bowel: Descending colostomy. Bowel anastomosis in the right abdomen. No evidence of bowel obstruction. Fluid collection with loculation appearance/peritoneal thickening along the left flank measuring up to 5 x 6 x 10 cm. Milder fluid accumulation in the ventral peritoneal space. Vascular/Lymphatic: Extensive atheromatous calcification of the aorta and iliacs. No mass or adenopathy. Reproductive:No pathologic findings. Other: No ascites or pneumoperitoneum. Musculoskeletal: No acute abnormalities.  T9 butterfly vertebra IMPRESSION: 1. Interval colostomy with fluid collection along the left gutter, dominant pocket measuring 10 x 6 x 5 cm. 2. No bowel obstruction. 3. Cholelithiasis and left nephrolithiasis. Electronically Signed   By: Tiburcio Pea M.D.   On: 11/06/2023 07:12   DG Chest Port 1 View Result Date: 11/03/2023 CLINICAL DATA:  Shortness of breath EXAM: PORTABLE CHEST - 1 VIEW COMPARISON:  11/11/2018 FINDINGS: Unchanged mild cardiomegaly and pulmonary vascular congestion. Interval worsening of left basilar opacity likely due to atelectasis. Lungs otherwise clear. Examination is limited due to expiratory phase of imaging. Interval placement of temporary right IJ hemodialysis catheter which terminates in the region of the right atrium. Nasogastric tube terminates in the left upper quadrant. IMPRESSION: Interval worsening of left basilar opacity which is likely due to atelectasis. Electronically Signed   By: Acquanetta Belling M.D.   On: 11/03/2023 07:22   IR Fluoro Guide CV Line Right Result Date: 11/01/2023 INDICATION: ESRD requiring HD.  EXAM: NON-TUNNELED CENTRAL VENOUS HEMODIALYSIS CATHETER PLACEMENT WITH ULTRASOUND AND FLUOROSCOPIC GUIDANCE COMPARISON:  CT AP 10/31/2023. MEDICATIONS: Local anesthetic was  administered. FLUOROSCOPY TIME:  Fluoroscopic dose; 35 mGy COMPLICATIONS: None immediate. PROCEDURE: Informed written consent was obtained from the patient and/or patient's representative after a discussion of the risks, benefits, and alternatives to treatment. Questions regarding the procedure were encouraged and answered. The RIGHT neck and chest were prepped with chlorhexidine in a sterile fashion, and a sterile drape was applied covering the operative field. Maximum barrier sterile technique with sterile gowns and gloves were used for the procedure. A timeout was performed prior to the initiation of the procedure. After the overlying soft tissues were anesthetized, a small venotomy incision was created and a micropuncture kit was utilized to access the external jugular vein. Real-time ultrasound guidance was utilized for vascular access including the acquisition of a permanent ultrasound image documenting patency of the accessed vessel. The microwire was utilized to measure appropriate catheter length. A stiff glidewire was advanced to the level of the IVC. Under fluoroscopic guidance, the venotomy was serially dilated, ultimately allowing placement of a 20 cm temporary Trialysis catheter with tip ultimately terminating within the superior aspect of the right atrium. Final catheter positioning was confirmed and documented with a spot radiographic image. The catheter aspirates and flushes normally. The catheter was flushed with appropriate volume heparin dwells. The catheter exit site was secured with a 2-0 Ethilon retention suture. A dressing was placed. The patient tolerated the procedure well without immediate post procedural complication. IMPRESSION: Successful placement of a RIGHT EXTERNAL jugular approach 20 cm non-tunneled dialysis  catheter The tip of the catheter is positioned within the proximal RIGHT atrium. The catheter is ready for immediate use. PLAN: This catheter may be converted to a tunneled dialysis catheter at a later date as indicated. Roanna Banning, MD [facility] Radiology Electronically Signed   By: Roanna Banning M.D.   On: 11/01/2023 18:15   IR US Guide Vasc Access Right Result Date: 11/01/2023 INDICATION: ESRD requiring HD. EXAM: NON-TUNNELED CENTRAL VENOUS HEMODIALYSIS CATHETER PLACEMENT WITH ULTRASOUND AND FLUOROSCOPIC GUIDANCE COMPARISON:  CT AP 10/31/2023. MEDICATIONS: Local anesthetic was administered. FLUOROSCOPY TIME:  Fluoroscopic dose; 35 mGy COMPLICATIONS: None immediate. PROCEDURE: Informed written consent was obtained from the patient and/or patient's representative after a discussion of the risks, benefits, and alternatives to treatment. Questions regarding the procedure were encouraged and answered. The RIGHT neck and chest were prepped with chlorhexidine in a sterile fashion, and a sterile drape was applied covering the operative field. Maximum barrier sterile technique with sterile gowns and gloves were used for the procedure. A timeout was performed prior to the initiation of the procedure. After the overlying soft tissues were anesthetized, a small venotomy incision was created and a micropuncture kit was utilized to access the external jugular vein. Real-time ultrasound guidance was utilized for vascular access including the acquisition of a permanent ultrasound image documenting patency of the accessed vessel. The microwire was utilized to measure appropriate catheter length. A stiff glidewire was advanced to the level of the IVC. Under fluoroscopic guidance, the venotomy was serially dilated, ultimately allowing placement of a 20 cm temporary Trialysis catheter with tip ultimately terminating within the superior aspect of the right atrium. Final catheter  positioning was confirmed and documented with a spot radiographic image. The catheter aspirates and flushes normally. The catheter was flushed with appropriate volume heparin dwells. The catheter exit site was secured with a 2-0 Ethilon retention suture. A dressing was placed. The patient tolerated the procedure well without immediate post procedural complication. IMPRESSION: Successful placement of a RIGHT  EXTERNAL jugular approach 20 cm non-tunneled dialysis catheter The tip of the catheter is positioned within the proximal RIGHT atrium. The catheter is ready for immediate use. PLAN: This catheter may be converted to a tunneled dialysis catheter at a later date as indicated. Roanna Banning, MD [facility] Radiology Electronically Signed   By: Roanna Banning M.D.   On: 11/01/2023 18:15   CT ABDOMEN PELVIS WO CONTRAST Result Date: 10/31/2023 CLINICAL DATA:  Diverticulitis, complication suspected. EXAM: CT ABDOMEN AND PELVIS WITHOUT CONTRAST TECHNIQUE: Multidetector CT imaging of the abdomen and pelvis was performed following the standard protocol without IV contrast. RADIATION DOSE REDUCTION: This exam was performed according to the departmental dose-optimization program which includes automated exposure control, adjustment of the mA and/or kV according to patient size and/or use of iterative reconstruction technique. COMPARISON:  10/27/2023. FINDINGS: Lower chest: Heart is enlarged and multi-vessel coronary artery calcifications are noted. Consolidation and patchy airspace disease is present at the lung bases bilaterally. Hepatobiliary: No focal liver abnormality is seen. Stones are present within the gallbladder. No biliary ductal dilatation. Pancreas: Unremarkable. No pancreatic ductal dilatation or surrounding inflammatory changes. Spleen: Normal in size without focal abnormality. Adrenals/Urinary Tract: The adrenal glands are within normal limits. Calcifications are  present in the upper pole of the left kidney. There is a cyst in the lower pole of the right kidney. No hydronephrosis bilaterally. The bladder is unremarkable. Stomach/Bowel: The stomach is within normal limits. No bowel obstruction or pneumatosis is seen. Right hemicolectomy changes are noted. There is diffuse colonic wall thickening. Scattered diverticula are present along the colon. A few small foci of free air are noted in the mesentery in the mid left abdomen, slightly increased from the previous exam. There is suggestion of contrast extravasation in the region. There is fine no definite abscess is seen. Vascular/Lymphatic: Aortic atherosclerosis. No enlarged abdominal or pelvic lymph nodes. Reproductive: No acute abnormality. Other: Mild ascites is noted in all 4 quadrants. A fat containing umbilical hernia is noted. There is mild anasarca. Musculoskeletal: The bony structures are stable. IMPRESSION: 1. Worsening diverticulitis of the descending colon with perforation and increasing free air and suspected oral contrast extravasation in the mid left abdomen. No definite abscess is seen. Surgical consultation is recommended. 2. Mild ascites. 3. Diffuse colonic wall thickening which may be due to associated inflammatory changes. 4. Patchy airspace disease at the lung bases with bilateral lower lobe consolidation. 5. Cholelithiasis. 6. Aortic atherosclerosis. Critical Value/emergent results were called by telephone at the time of interpretation on 10/31/2023 at 11:59 pm to provider Dr. Antionette Char, who verbally acknowledged these results. Electronically Signed   By: Thornell Sartorius M.D.   On: 10/31/2023 23:59   CT ABDOMEN PELVIS WO CONTRAST Result Date: 10/27/2023 CLINICAL DATA:  Acute nonlocalized abdominal pain. Chills and fever. Vomiting and diarrhea. EXAM: CT ABDOMEN AND PELVIS WITHOUT CONTRAST TECHNIQUE: Multidetector CT imaging of the abdomen and pelvis was performed following the standard protocol without IV  contrast. RADIATION DOSE REDUCTION: This exam was performed according to the departmental dose-optimization program which includes automated exposure control, adjustment of the mA and/or kV according to patient size and/or use of iterative reconstruction technique. COMPARISON:  CT abdomen and pelvis 04/19/2021 FINDINGS: Lower chest: Bibasilar atelectasis.  No acute abnormality. Hepatobiliary: Cholelithiasis without evidence of acute cholecystitis. No biliary dilation. Unremarkable noncontrast appearance of the liver. Pancreas: Unremarkable. Spleen: Unremarkable. Adrenals/Urinary Tract: Normal adrenal glands. No urinary calculi or hydronephrosis. Unremarkable bladder. Stomach/Bowel: Stomach is within normal  limits. No bowel obstruction. Colonic diverticulosis. Wall thickening, inflammatory stranding, and free fluid about the descending colon. Loosely organized fluid and gas about the inflamed descending colon (circa series 3/image 49-50) compatible with contained perforation. The adjacent jejunum is inflamed. Postoperative change about the colon with ileocolonic anastomosis in the right upper quadrant. Vascular/Lymphatic: Aortic atherosclerosis. No enlarged abdominal or pelvic lymph nodes. Reproductive: No acute abnormality. Other: Fat containing periumbilical hernia. Musculoskeletal: No acute fracture. IMPRESSION: 1. Acute diverticulitis of the descending colon with contained perforation. 2. Inflamed jejunum adjacent to the perforated diverticulitis. 3.  Aortic Atherosclerosis (ICD10-I70.0). Critical Value/emergent results were called by telephone at the time of interpretation on 10/27/2023 at 9:22 pm to provider [facility] , who verbally acknowledged these results. Electronically Signed   By: Minerva Fester M.D.   On: 10/27/2023 21:24    Signature  -   Jeoffrey Massed M.D on 11/22/2023 at 11:39 AM   -  To page go to [email or web address]

## 2023-11-22 NOTE — Plan of Care (Signed)
  Problem: Education: Goal: Ability to describe self-care measures that may prevent or decrease complications (Diabetes Survival Skills Education) will improve Outcome: Progressing Goal: Individualized Educational Video(s) Outcome: Progressing   Problem: Coping: Goal: Ability to adjust to condition or change in health will improve Outcome: Progressing   Problem: Fluid Volume: Goal: Ability to maintain a balanced intake and output will improve Outcome: Progressing   Problem: Health Behavior/Discharge Planning: Goal: Ability to identify and utilize available resources and services will improve Outcome: Progressing Goal: Ability to manage health-related needs will improve Outcome: Progressing   Problem: Metabolic: Goal: Ability to maintain appropriate glucose levels will improve Outcome: Progressing   Problem: Nutritional: Goal: Maintenance of adequate nutrition will improve Outcome: Progressing Goal: Progress toward achieving an optimal weight will improve Outcome: Progressing   Problem: Skin Integrity: Goal: Risk for impaired skin integrity will decrease Outcome: Progressing   Problem: Tissue Perfusion: Goal: Adequacy of tissue perfusion will improve Outcome: Progressing   Problem: Education: Goal: Knowledge of General Education information will improve Description: Including pain rating scale, medication(s)/side effects and non-pharmacologic comfort measures Outcome: Progressing   Problem: Health Behavior/Discharge Planning: Goal: Ability to manage health-related needs will improve Outcome: Progressing   Problem: Clinical Measurements: Goal: Ability to maintain clinical measurements within normal limits will improve Outcome: Progressing Goal: Will remain free from infection Outcome: Progressing Goal: Diagnostic test results will improve Outcome: Progressing Goal: Respiratory complications will improve Outcome: Progressing Goal: Cardiovascular complication will  be avoided Outcome: Progressing   Problem: Activity: Goal: Risk for activity intolerance will decrease Outcome: Progressing   Problem: Nutrition: Goal: Adequate nutrition will be maintained Outcome: Progressing   Problem: Coping: Goal: Level of anxiety will decrease Outcome: Progressing   Problem: Elimination: Goal: Will not experience complications related to bowel motility Outcome: Progressing Goal: Will not experience complications related to urinary retention Outcome: Progressing   Problem: Pain Managment: Goal: General experience of comfort will improve and/or be controlled Outcome: Progressing   Problem: Safety: Goal: Ability to remain free from injury will improve Outcome: Progressing   Problem: Skin Integrity: Goal: Risk for impaired skin integrity will decrease Outcome: Progressing   Problem: Education: Goal: Knowledge of the prescribed therapeutic regimen will improve Outcome: Progressing   Problem: Coping: Goal: Ability to identify and develop effective coping behavior will improve Outcome: Progressing   Problem: Clinical Measurements: Goal: Quality of life will improve Outcome: Progressing   Problem: Respiratory: Goal: Verbalizations of increased ease of respirations will increase Outcome: Progressing   Problem: Role Relationship: Goal: Family's ability to cope with current situation will improve Outcome: Progressing Goal: Ability to verbalize concerns, feelings, and thoughts to partner or family member will improve Outcome: Progressing   Problem: Pain Management: Goal: Satisfaction with pain management regimen will improve Outcome: Progressing   Problem: Education: Goal: Knowledge of disease and its progression will improve Outcome: Progressing Goal: Individualized Educational Video(s) Outcome: Progressing   Problem: Fluid Volume: Goal: Compliance with measures to maintain balanced fluid volume will improve Outcome: Progressing    Problem: Health Behavior/Discharge Planning: Goal: Ability to manage health-related needs will improve Outcome: Progressing   Problem: Nutritional: Goal: Ability to make healthy dietary choices will improve Outcome: Progressing   Problem: Clinical Measurements: Goal: Complications related to the disease process, condition or treatment will be avoided or minimized Outcome: Progressing

## 2023-11-22 NOTE — Procedures (Signed)
 HD Note:  Some information was entered later than the data was gathered due to patient care needs. The stated time with the data is accurate.  Received patient in bed to unit.   Alert and oriented.   Informed consent signed and in chart.   Access used: upper right chest HD catheter Access issues: None  Patient tolerated treatment well.   TX duration: 3.5 hours  Alert, without acute distress.  Total UF removed: 1000 ml  Hand-off given to patient's nurse.   Transported back to the room   Hayle Parisi L. Dareen Piano, RN Kidney Dialysis Unit.

## 2023-11-22 NOTE — Progress Notes (Signed)
   11/22/23 1330  Vitals  Pulse Rate 75  Resp 19  BP 124/62  SpO2 100 %  O2 Device Room Air  Oxygen Therapy  Patient Activity (if Appropriate) In bed  Pulse Oximetry Type Continuous  Oximetry Probe Site Changed No  During Treatment Monitoring  Blood Flow Rate (mL/min) 0 mL/min  Arterial Pressure (mmHg) -0.61 mmHg  Venous Pressure (mmHg) -1.61 mmHg  TMP (mmHg) 10.7 mmHg  Ultrafiltration Rate (mL/min) 1122 mL/min  Dialysate Flow Rate (mL/min) 299 ml/min  Dialysate Potassium Concentration 3  Dialysate Calcium Concentration 2.5  Duration of HD Treatment -hour(s) 3.5 hour(s)  Cumulative Fluid Removed (mL) per Treatment  1500.25  HD Safety Checks Performed Yes  Intra-Hemodialysis Comments Tx completed   Received patient in bed to unit.  Alert and oriented.  Informed consent signed and in chart.   TX duration: 3.5  Patient tolerated well.  Transported back to the room  Alert, without acute distress.  Hand-off given to patient's nurse.   Access used: Right Subclavian Access issues: None  Total UF removed: 1500 Medication(s) given: None   Mar Daring, LPN  Kidney Dialysis Unit

## 2023-11-22 NOTE — Plan of Care (Signed)
  Problem: Coping: Goal: Ability to adjust to condition or change in health will improve Outcome: Progressing   Problem: Fluid Volume: Goal: Ability to maintain a balanced intake and output will improve Outcome: Progressing   Problem: Metabolic: Goal: Ability to maintain appropriate glucose levels will improve Outcome: Progressing   Problem: Nutritional: Goal: Maintenance of adequate nutrition will improve Outcome: Progressing   Problem: Education: Goal: Knowledge of General Education information will improve Description: Including pain rating scale, medication(s)/side effects and non-pharmacologic comfort measures Outcome: Progressing   Problem: Clinical Measurements: Goal: Diagnostic test results will improve Outcome: Progressing   Problem: Activity: Goal: Risk for activity intolerance will decrease Outcome: Progressing   Problem: Nutrition: Goal: Adequate nutrition will be maintained Outcome: Progressing

## 2023-11-22 NOTE — TOC Progression Note (Signed)
 Transition of Care [facility]) - Progression Note    Patient Details  Name: [name] MRN: [id number] Date of Birth: 09-Aug-1956  [facility] CM/SW Contact  Mearl Latin, LCSW Phone Number: 11/22/2023, 2:27 PM  Clinical Narrative:    Sonny Dandy awaiting delivery of wound vac, likely Monday.    Expected Discharge Plan: Skilled Nursing Facility Barriers to Discharge: Continued Medical Work up, English as a second language teacher  Expected Discharge Plan and Services In-house Referral: Clinical Social Work   Post Acute Care Choice: IP Rehab Living arrangements for the past 2 months: Single Family Home                                       Social Determinants of Health (SDOH) Interventions SDOH Screenings   Food Insecurity: No Food Insecurity (10/28/2023)  Housing: Low Risk  (10/28/2023)  Transportation Needs: No Transportation Needs (10/28/2023)  Utilities: Not At Risk (10/28/2023)  Depression (PHQ2-9): Low Risk  (02/28/2021)  Financial Resource Strain: Low Risk  (08/25/2021)   Received from [facility], [facility]  Physical Activity: Insufficiently Active (06/20/2021)   Received from [facility], [facility]  Social Connections: Socially Isolated (10/28/2023)  Stress: No Stress Concern Present (08/25/2021)   Received from [facility], [facility]  Tobacco Use: Low Risk  (11/02/2023)    Readmission Risk Interventions     No data to display

## 2023-11-22 NOTE — Progress Notes (Signed)
 Contacted by CSW with request for pt to receive inpt HD 1st shift on Monday due to hopeful d/c to snf after HD. Contacted inpt HD unit RN and also discussed with renal NP and made nephrologist aware as well. Will assist as needed.   Olivia Canter Renal Navigator [phone]

## 2023-11-23 LAB — GLUCOSE, CAPILLARY
Glucose-Capillary: 160 mg/dL — ABNORMAL HIGH (ref 70–99)
Glucose-Capillary: 110 mg/dL — ABNORMAL HIGH (ref 70–99)
Glucose-Capillary: 209 mg/dL — ABNORMAL HIGH (ref 70–99)
Glucose-Capillary: 131 mg/dL — ABNORMAL HIGH (ref 70–99)

## 2023-11-23 NOTE — Progress Notes (Signed)
 Patient ID: [name], female   DOB: Dec 26, 1955, 68 y.o.   MRN: [id number] [facility] Progress Note   Assessment/ Plan:   1.  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  After initial refusal of wanting to pursue renal replacement therapy, she changed her mind and asked to start dialysis with the intention of undergoing surgery for contained perforation of acute diverticulitis.  -TDC w/ IR on 2/18 -Maintain dialysis on MWF schedule. Next treatment Monday 1st shift -Palliative care has been involved and patient wishes to continue with dialysis for now.   -CLIP in process; likely [facility], renal navigator following. Pending [facility] -Would hold on permanent access creation for now given overall clinical picture, will likely need to revisit as an outpatient 2.  Acute diverticulitis with contained perforation of descending colon: On Zosyn and now status post exploratory laparotomy with Hartman's resection for perforated diverticulitis/peritonitis (2/8).  Management per surgery and primary team 3.  Anion gap metabolic acidosis: Resolved with renal replacement therapy 4.  Anemia: Has received blood and IV iron. ESA also ordered, increased to qmonday 5. HTN: BP acceptable/stable, uf as tolerated 6. Dispo: eval for CIR.  Subjective:   Patient seen and examined bedside. No acute events/complaints   Objective:   BP (!) 102/55 (BP Location: Right Arm)   Pulse 73   Temp 98.5 F (36.9 C) (Oral)   Resp 17   Ht 5' 7.01" (1.702 m)   Wt 86.8 kg   SpO2 99%   BMI 29.96 kg/m   Intake/Output Summary (Last 24 hours) at 11/23/2023 1236 Last data filed at 11/23/2023 0000 Gross per 24 hour  Intake 480 ml  Output 1530 ml  Net -1050 ml   Weight change:   Physical Exam: Gen: nad, in chair CVS: Normal rate, no rub Resp: Bilateral  chest rise with no increased work of breathing Abd: Minimal distention, colostomy bag in place Ext: no sig edema b/l LEs Neuro: awake, alert Dialysis access: right TDC c/d/i  Imaging: No results found.      Labs: BMET Recent Labs  Lab 11/18/23 0551 11/19/23 0834 11/20/23 0820 11/22/23 0835  NA 135 134* 133* 130*  K 3.5 3.5 3.7 3.8  CL 100 97* 96* 96*  CO2 22 26 24 25   GLUCOSE 118* 88 164* 162*  BUN 30* 17 24* 24*  CREATININE 8.20* 6.15* 7.84* 7.31*  CALCIUM 7.9* 8.2* 8.0* 7.8*  PHOS 4.8* 4.3 5.6* 4.8*   CBC Recent Labs  Lab 11/18/23 0551 11/19/23 0834 11/20/23 0820 11/22/23 0835  WBC 10.7* 10.2 10.3 8.9  HGB 9.1* 10.1* 9.3* 9.1*  HCT 28.8* 31.1* 29.8* 28.7*  MCV 85.0 84.1 85.6 85.2  PLT 338 227 232 191    Medications:     amLODipine  5 mg Oral Daily   apixaban  5 mg Oral BID   brimonidine  1 drop Both Eyes BID   carvedilol  6.25 mg Oral BID WC   Chlorhexidine Gluconate Cloth  6 each Topical Q0600   [START ON 11/25/2023] darbepoetin (ARANESP) injection - DIALYSIS  100 mcg Subcutaneous Q Mon-1800   docusate sodium  100 mg Oral BID   insulin aspart  0-15 Units Subcutaneous TID WC   insulin aspart  0-5 Units Subcutaneous QHS   insulin glargine  12 Units Subcutaneous Q24H   latanoprost  1 drop Both Eyes QHS   mouth rinse  15 mL Mouth Rinse 4 times per day   pantoprazole  40 mg Oral BID   polyethylene glycol  17 g Oral BID   senna  1 tablet Oral Daily   sodium chloride flush  10-40 mL Intracatheter Q12H   Kelyn Ponciano  11/23/2023, 12:36 PM

## 2023-11-23 NOTE — Progress Notes (Signed)
 Mobility Specialist: Progress Note   11/23/23 1311  Mobility  Activity Ambulated with assistance in hallway  Level of Assistance Standby assist, set-up cues, supervision of patient - no hands on  Assistive Device Four wheel walker  Distance Ambulated (ft) 65 ft  Activity Response Tolerated fair  Mobility Referral Yes  Mobility visit 1 Mobility  Mobility Specialist Start Time (ACUTE ONLY) 1135  Mobility Specialist Stop Time (ACUTE ONLY) 1200  Mobility Specialist Time Calculation (min) (ACUTE ONLY) 25 min    Pt was agreeable to mobility session - received in bed. Sv for bed mobility, CG for STS, SV for ambulation. Feeling very weak and fatigued today. Took 2x standing break d/t fatigue. Returned to room without fault. Left in chair with all needs met, call bell in reach.   Maurene Capes Mobility Specialist Please contact via SecureChat or Rehab office at [phone]

## 2023-11-23 NOTE — Progress Notes (Signed)
 PROGRESS NOTE        PATIENT DETAILS Name: [name] Age: 68 y.o. Sex: female Date of Birth: Aug 19, 1956 Admit Date: 10/27/2023 Admitting Physician Dolly Rias, MD WJX:BJYNW, Devonne Doughty, NP  Brief Summary: Patient is a 68 y.o.  female with history of CKD 5, A-fib on Eliquis, CAD-who presented with abdominal pain-found to have acute diverticulitis with contained perforation involving the descending colon.  Hospital course complicated by worsening AKI-thought to have progressed to ESRD-requiring initiation of HD.  Unfortunately-even with conservative measures/antibiotics-she continued to worsen and ended up with exploratory laparotomy/Hartman's procedure on 2/8.  Significant events: 2/2>> admit to [facility] 2/6>> CT abdomen worsening diverticulitis with perforation 2/7>> transitioned to full comfort measures-as with worsening AKI-however multiple family members arrived-comfort care revoked-now full code-full scope of treatment including HD/laparotomy with colostomy.  General surgery/nephrology reconsulted.  IR placed [facility] and patient underwent first hemodialysis.  2/8>> remains n.p.o-colectomy/colostomy later today. 2/20>> wound VAC reapplied  Significant studies: 2/2>> CT abdomen/pelvis: Acute diverticulitis-descending colon with contained perforation 2/6>> CT abdomen/pelvis: Worsening diverticulitis of descending colon with perforation and increasing failure-suspected oral contrast extravasation left abdomen.  Significant microbiology data: 2/2>> COVID/influenza/RSV PCR: Negative  Procedures: 2/8>> by Dr. Freida Busman underwent exploratory laparotomy with Hartman's resection for perforated diverticulitis with diffuse peritonitis, colostomy in place. 2/13 - R IJ Trialysis catheter - IR 2/14 -left IJ Central line placed by IR for TNA(removed on 2/22) 11/12/2023. R.internal jugular tunneled HD catheter placement by  IR  Consults: Surgery Renal Palliative IR  Subjective:  No complaints-awaiting SNF bed.  Objective: Vitals: Blood pressure (!) 102/55, pulse 73, temperature 98.5 F (36.9 C), temperature source Oral, resp. rate 17, height 5' 7.01" (1.702 m), weight 86.8 kg, SpO2 99%.   Exam: Awake/alert/not in any distress Abdomen: Soft-nondistended-wound VAC in place.  Assessment/Plan: Acute diverticulitis with contained perforation of the descending colon  S/p Hartman's procedure on 2/8-completed a course of antibiotics-off TNA-appetite improving-tolerating advancement in diet.  Wound VAC reapplied 2/20-General Surgery following and directing postop care.  AKI on CKD 5 with progression to ESRD AKI likely hemodynamically mediated-has now likely progressed to ESRD.   HD initiated via right IJ temporary catheter, switched to right IJ trialysis catheter by IR on 11/07/2023>> tunneled HD catheter right IJ on 11/12/2023 by IR. CLIP in process, renal would like to hold on permanent access creation for now given her overall clinical picture.  CAD Currently no anginal symptoms Suspect not on antiplatelets as on anticoagulation with Eliquis/heparin Continue beta-blocker On Repatha injections as an outpatient.  PAF Sinus rhythm Continue Coreg Currently on Eliquis  Anemia of acute illness  Anemia of end-stage renal disease Multifactorial anemia-did 3 units of PRBC-last transfused on 2/19-Hb currently stable. Iron/Aranesp per nephrology service.   Hb currently stable-follow CBC closely.  HTN BP stable Continue Norvasc/Coreg  DM-2 (A1c 6.6 on 2/3) CBGs stable Semglee 12 units daily+ SSI.   Recent Labs    11/22/23 1634 11/22/23 2247 11/23/23 0811  GLUCAP 157* 165* 160*    Palliative care/goals of care Palliative input greatly appreciated Note-initially did not want to pursue surgery/hemodialysis-comfort measures was contemplated-comfort measures was revoked-patient was a full code-but  after continued discussion-she is now a DNR.  Class 1 Obesity: Estimated body mass index is 29.96 kg/m as calculated from the following:   Height as of this encounter: 5' 7.01" (1.702 m).   Weight as  of this encounter: 86.8 kg.    Code status:   Code Status: Limited: Do not attempt resuscitation (DNR) -DNR-LIMITED -Do Not Intubate/DNI    DVT Prophylaxis: Place and maintain sequential compression device Start: 11/06/23 0613 apixaban (ELIQUIS) tablet 5 mg IV Heparin  Family Communication: Discussed with patient, all her questions answered.  Disposition Plan: Status is: Inpatient-due to severity of illness  Disposition: SNF-awaiting placement.   Diet: Diet Order             Diet regular Room service appropriate? Yes; Fluid consistency: Thin  Diet effective now                   MEDICATIONS: Scheduled Meds:  amLODipine  5 mg Oral Daily   apixaban  5 mg Oral BID   brimonidine  1 drop Both Eyes BID   carvedilol  6.25 mg Oral BID WC   Chlorhexidine Gluconate Cloth  6 each Topical Q0600   [START ON 11/25/2023] darbepoetin (ARANESP) injection - DIALYSIS  100 mcg Subcutaneous Q Mon-1800   docusate sodium  100 mg Oral BID   insulin aspart  0-15 Units Subcutaneous TID WC   insulin aspart  0-5 Units Subcutaneous QHS   insulin glargine  12 Units Subcutaneous Q24H   latanoprost  1 drop Both Eyes QHS   mouth rinse  15 mL Mouth Rinse 4 times per day   pantoprazole  40 mg Oral BID   polyethylene glycol  17 g Oral BID   senna  1 tablet Oral Daily   sodium chloride flush  10-40 mL Intracatheter Q12H   Continuous Infusions:   PRN Meds:.acetaminophen **OR** acetaminophen, albuterol, hydrALAZINE, HYDROmorphone (DILAUDID) injection, melatonin, menthol-cetylpyridinium, ondansetron (ZOFRAN) IV, mouth rinse, oxyCODONE   I have personally reviewed following labs and imaging studies  LABORATORY DATA:   Data Review:    Recent Labs  Lab 11/18/23 0551 11/19/23 0834 11/20/23 0820  11/22/23 0835  WBC 10.7* 10.2 10.3 8.9  HGB 9.1* 10.1* 9.3* 9.1*  HCT 28.8* 31.1* 29.8* 28.7*  PLT 338 227 232 191  MCV 85.0 84.1 85.6 85.2  MCH 26.8 27.3 26.7 27.0  MCHC 31.6 32.5 31.2 31.7  RDW 16.9* 16.8* 16.6* 16.5*    Recent Labs  Lab 11/18/23 0551 11/19/23 0834 11/20/23 0820 11/22/23 0835  NA 135 134* 133* 130*  K 3.5 3.5 3.7 3.8  CL 100 97* 96* 96*  CO2 22 26 24 25   ANIONGAP 13 11 13 9   GLUCOSE 118* 88 164* 162*  BUN 30* 17 24* 24*  CREATININE 8.20* 6.15* 7.84* 7.31*  ALBUMIN 2.1* 2.2* 2.2* 2.1*  PHOS 4.8* 4.3 5.6* 4.8*  CALCIUM 7.9* 8.2* 8.0* 7.8*      Recent Labs  Lab 11/18/23 0551 11/19/23 0834 11/20/23 0820 11/22/23 0835  CALCIUM 7.9* 8.2* 8.0* 7.8*    --------------------------------------------------------------------------------------------------------------- Lab Results  Component Value Date   CHOL 134 12/21/2019   HDL 49 12/21/2019   LDLCALC 69 12/21/2019   TRIG 148 11/11/2023   CHOLHDL 2.7 12/21/2019    Lab Results  Component Value Date   HGBA1C 6.6 (H) 10/28/2023      Micro Results No results found for this or any previous visit (from the past 240 hours).    Radiology Reports DG Abd Portable 1V Result Date: 11/19/2023 CLINICAL DATA:  Abdominal pain.  Colostomy back in place. EXAM: PORTABLE ABDOMEN - 1 VIEW COMPARISON:  CT dated 11/06/2023. FINDINGS: No bowel dilatation or evidence of obstruction. There is paucity of bowel air.  Anastomotic staple line noted in the right lower quadrant. No free air. The osseous structures are intact. The soft tissues are unremarkable. IMPRESSION: Nonobstructive bowel gas pattern. Electronically Signed   By: Elgie Collard M.D.   On: 11/19/2023 09:59   IR Fluoro Guide CV Line Right Result Date: 11/12/2023 CLINICAL DATA:  End-stage renal disease and status post placement of non tunneled temporary dialysis catheter on 11/01/2023. The patient now requires placement of a tunneled dialysis catheter for  further longer-term hemodialysis. EXAM: TUNNELED CENTRAL VENOUS HEMODIALYSIS CATHETER PLACEMENT WITH ULTRASOUND AND FLUOROSCOPIC GUIDANCE ANESTHESIA/SEDATION: Moderate (conscious) sedation was employed during this procedure. A total of Versed 1.0 mg and Fentanyl 50 mcg was administered intravenously. Moderate Sedation Time: 16 minutes. The patient's level of consciousness and vital signs were monitored continuously by radiology nursing throughout the procedure under my direct supervision. MEDICATIONS: The patient is receiving IV Rocephin every 8 hours in the hospital and did not receive additional antibiotics for the procedure. FLUOROSCOPY: 24 seconds.  2.3 mGy. PROCEDURE: The procedure, risks, benefits, and alternatives were explained to the patient. Questions regarding the procedure were encouraged and answered. The patient understands and consents to the procedure. A timeout was performed prior to initiating the procedure. A non tunneled temporary right-sided dialysis catheter was removed prior to the procedure and pressure held at the exit site. The right neck and chest were prepped with chlorhexidine in a sterile fashion, and a sterile drape was applied covering the operative field. Maximum barrier sterile technique with sterile gowns and gloves were used for the procedure. Local anesthesia was provided with 1% lidocaine. Ultrasound was used to confirm patency of the right internal jugular vein. An ultrasound image was saved and recorded. After creating a small venotomy incision, a 21 gauge needle was advanced into the right internal jugular vein under direct, real-time ultrasound guidance. Ultrasound image documentation was performed. After securing guidewire access, an 8 Fr dilator was placed. A J-wire was kinked to measure appropriate catheter length. A Palindrome tunneled hemodialysis catheter measuring 19 cm from tip to cuff was chosen for placement. This was tunneled in a retrograde fashion from the chest  wall to the venotomy incision. At the venotomy, serial dilatation was performed and a 15 Fr peel-away sheath was placed over a guidewire. The catheter was then placed through the sheath and the sheath removed. Final catheter positioning was confirmed and documented with a fluoroscopic spot image. The catheter was aspirated, flushed with saline, and injected with appropriate volume heparin dwells. The venotomy incision was closed with subcuticular 4-0 Vicryl. Dermabond was applied to the incision. The catheter exit site was secured with 0-Prolene retention sutures. COMPLICATIONS: None.  No pneumothorax. FINDINGS: After catheter placement, the tip lies in the right atrium. The catheter aspirates normally and is ready for immediate use. IMPRESSION: Placement of tunneled hemodialysis catheter via new puncture of the right internal jugular vein after removal of the non tunneled catheter. The new catheter tip lies in the right atrium. The catheter is ready for immediate use. Electronically Signed   By: Irish Lack M.D.   On: 11/12/2023 10:45   IR US Guide Vasc Access Right Result Date: 11/12/2023 CLINICAL DATA:  End-stage renal disease and status post placement of non tunneled temporary dialysis catheter on 11/01/2023. The patient now requires placement of a tunneled dialysis catheter for further longer-term hemodialysis. EXAM: TUNNELED CENTRAL VENOUS HEMODIALYSIS CATHETER PLACEMENT WITH ULTRASOUND AND FLUOROSCOPIC GUIDANCE ANESTHESIA/SEDATION: Moderate (conscious) sedation was employed during this procedure. A total of Versed 1.0  mg and Fentanyl 50 mcg was administered intravenously. Moderate Sedation Time: 16 minutes. The patient's level of consciousness and vital signs were monitored continuously by radiology nursing throughout the procedure under my direct supervision. MEDICATIONS: The patient is receiving IV Rocephin every 8 hours in the hospital and did not receive additional antibiotics for the procedure.  FLUOROSCOPY: 24 seconds.  2.3 mGy. PROCEDURE: The procedure, risks, benefits, and alternatives were explained to the patient. Questions regarding the procedure were encouraged and answered. The patient understands and consents to the procedure. A timeout was performed prior to initiating the procedure. A non tunneled temporary right-sided dialysis catheter was removed prior to the procedure and pressure held at the exit site. The right neck and chest were prepped with chlorhexidine in a sterile fashion, and a sterile drape was applied covering the operative field. Maximum barrier sterile technique with sterile gowns and gloves were used for the procedure. Local anesthesia was provided with 1% lidocaine. Ultrasound was used to confirm patency of the right internal jugular vein. An ultrasound image was saved and recorded. After creating a small venotomy incision, a 21 gauge needle was advanced into the right internal jugular vein under direct, real-time ultrasound guidance. Ultrasound image documentation was performed. After securing guidewire access, an 8 Fr dilator was placed. A J-wire was kinked to measure appropriate catheter length. A Palindrome tunneled hemodialysis catheter measuring 19 cm from tip to cuff was chosen for placement. This was tunneled in a retrograde fashion from the chest wall to the venotomy incision. At the venotomy, serial dilatation was performed and a 15 Fr peel-away sheath was placed over a guidewire. The catheter was then placed through the sheath and the sheath removed. Final catheter positioning was confirmed and documented with a fluoroscopic spot image. The catheter was aspirated, flushed with saline, and injected with appropriate volume heparin dwells. The venotomy incision was closed with subcuticular 4-0 Vicryl. Dermabond was applied to the incision. The catheter exit site was secured with 0-Prolene retention sutures. COMPLICATIONS: None.  No pneumothorax. FINDINGS: After catheter  placement, the tip lies in the right atrium. The catheter aspirates normally and is ready for immediate use. IMPRESSION: Placement of tunneled hemodialysis catheter via new puncture of the right internal jugular vein after removal of the non tunneled catheter. The new catheter tip lies in the right atrium. The catheter is ready for immediate use. Electronically Signed   By: Irish Lack M.D.   On: 11/12/2023 10:45   IR US Guide Vasc Access Left Result Date: 11/08/2023 INDICATION: ESRD.  IV antibiotics. EXAM: ULTRASOUND AND FLUOROSCOPIC GUIDED PLACEMENT OF TUNNELED CENTRAL VENOUS "Powerline" CATHETER MEDICATIONS: The patient was on scheduled IV antibiotics. ANESTHESIA/SEDATION: Local anesthetic was administered. The patient was continuously monitored during the procedure by the interventional radiology nurse under my direct supervision. FLUOROSCOPY TIME:  Fluoroscopic dose; 3.8 mGy COMPLICATIONS: None immediate. PROCEDURE: Informed written consent was obtained from the patient and/or patient's representative after a discussion of the risks, benefits, and alternatives to treatment. Questions regarding the procedure were encouraged and answered. The LEFT neck and chest were prepped with chlorhexidine in a sterile fashion, and a sterile drape was applied covering the operative field. Maximum barrier sterile technique with sterile gowns and gloves were used for the procedure. A timeout was performed prior to the initiation of the procedure. After the overlying soft tissues were anesthetized, a small venotomy incision was created and a micropuncture kit was utilized to access the internal jugular vein. Real-time ultrasound guidance was utilized for vascular  access including the acquisition of a permanent ultrasound image documenting patency of the accessed vessel. The microwire was utilized to measure appropriate catheter length. The micropuncture sheath was exchanged for a peel-away sheath over a guidewire. A 5 Fr  dual lumen tunneled central venous catheter measuring 33 cm was tunneled in a retrograde fashion from the anterior chest wall to the venotomy incision. The catheter was then placed through the peel-away sheath with tip ultimately positioned at the superior caval-atrial junction. Final catheter positioning was confirmed and documented with a spot radiographic image. The catheter aspirates and flushes normally. The catheter was flushed with appropriate volume heparin dwells. The catheter exit site was secured with a 3-0 Ethilon retention suture. The venotomy incision was closed with Dermabond. Dressings were applied. The patient tolerated the procedure well without immediate post procedural complication. FINDINGS: After catheter placement, the tip lies within the superior apect of the right atrium. The catheter aspirates and flushes normally and is ready for immediate use. IMPRESSION: Successful placement of 33 cm dual lumen tunneled "Powerline" central venous catheter via the LEFT internal jugular vein The tip of the catheter is positioned within the proximal RIGHT atrium. The catheter is ready for immediate use. Roanna Banning, MD [facility] Radiology Electronically Signed   By: Roanna Banning M.D.   On: 11/08/2023 18:10   IR Fluoro Guide CV Line Left Result Date: 11/08/2023 INDICATION: ESRD.  IV antibiotics. EXAM: ULTRASOUND AND FLUOROSCOPIC GUIDED PLACEMENT OF TUNNELED CENTRAL VENOUS "Powerline" CATHETER MEDICATIONS: The patient was on scheduled IV antibiotics. ANESTHESIA/SEDATION: Local anesthetic was administered. The patient was continuously monitored during the procedure by the interventional radiology nurse under my direct supervision. FLUOROSCOPY TIME:  Fluoroscopic dose; 3.8 mGy COMPLICATIONS: None immediate. PROCEDURE: Informed written consent was obtained from the patient and/or patient's representative after a discussion of the risks, benefits, and  alternatives to treatment. Questions regarding the procedure were encouraged and answered. The LEFT neck and chest were prepped with chlorhexidine in a sterile fashion, and a sterile drape was applied covering the operative field. Maximum barrier sterile technique with sterile gowns and gloves were used for the procedure. A timeout was performed prior to the initiation of the procedure. After the overlying soft tissues were anesthetized, a small venotomy incision was created and a micropuncture kit was utilized to access the internal jugular vein. Real-time ultrasound guidance was utilized for vascular access including the acquisition of a permanent ultrasound image documenting patency of the accessed vessel. The microwire was utilized to measure appropriate catheter length. The micropuncture sheath was exchanged for a peel-away sheath over a guidewire. A 5 Fr dual lumen tunneled central venous catheter measuring 33 cm was tunneled in a retrograde fashion from the anterior chest wall to the venotomy incision. The catheter was then placed through the peel-away sheath with tip ultimately positioned at the superior caval-atrial junction. Final catheter positioning was confirmed and documented with a spot radiographic image. The catheter aspirates and flushes normally. The catheter was flushed with appropriate volume heparin dwells. The catheter exit site was secured with a 3-0 Ethilon retention suture. The venotomy incision was closed with Dermabond. Dressings were applied. The patient tolerated the procedure well without immediate post procedural complication. FINDINGS: After catheter placement, the tip lies within the superior apect of the right atrium. The catheter aspirates and flushes normally and is ready for immediate use. IMPRESSION: Successful placement of 33 cm dual lumen tunneled "Powerline" central venous catheter via the LEFT internal jugular vein The tip  of the catheter is positioned within the proximal  RIGHT atrium. The catheter is ready for immediate use. Roanna Banning, MD [facility] Radiology Electronically Signed   By: Roanna Banning M.D.   On: 11/08/2023 18:10   VAS [place] LOWER EXTREMITY VENOUS (DVT) Result Date: 11/07/2023  Lower Venous DVT Study Patient Name:  SURENA WELGE [facility]  Date of Exam:   11/07/2023 Medical Rec #: [id number]          Accession #:    [id number] Date of Birth: 09/26/1955          Patient Gender: F Patient Age:   86 years Exam Location:  [facility] Procedure:      VAS [place] LOWER EXTREMITY VENOUS (DVT) Referring Phys: Tresa Endo OSBORNE --------------------------------------------------------------------------------  Indications: Pain, stroke, and A-fib, anemia.  Comparison Study: No prior exam. Performing Technologist: Fernande Bras  Examination Guidelines: A complete evaluation includes B-mode imaging, spectral Doppler, color Doppler, and power Doppler as needed of all accessible portions of each vessel. Bilateral testing is considered an integral part of a complete examination. Limited examinations for reoccurring indications may be performed as noted. The reflux portion of the exam is performed with the patient in reverse Trendelenburg.  +---------+---------------+---------+-----------+----------+--------------+ RIGHT    CompressibilityPhasicitySpontaneityPropertiesThrombus Aging +---------+---------------+---------+-----------+----------+--------------+ CFV      Full           Yes      Yes                                 +---------+---------------+---------+-----------+----------+--------------+ SFJ      Full           Yes      Yes                                 +---------+---------------+---------+-----------+----------+--------------+ FV Prox  Full                                                        +---------+---------------+---------+-----------+----------+--------------+ FV Mid   Full                                                         +---------+---------------+---------+-----------+----------+--------------+ FV DistalFull                                                        +---------+---------------+---------+-----------+----------+--------------+ PFV      Full                                                        +---------+---------------+---------+-----------+----------+--------------+ POP      Full           Yes  Yes                                 +---------+---------------+---------+-----------+----------+--------------+ PTV      Full                                                        +---------+---------------+---------+-----------+----------+--------------+ PERO     Full                                                        +---------+---------------+---------+-----------+----------+--------------+   +---------+---------------+---------+-----------+----------+--------------+ LEFT     CompressibilityPhasicitySpontaneityPropertiesThrombus Aging +---------+---------------+---------+-----------+----------+--------------+ CFV      Full           Yes      Yes                                 +---------+---------------+---------+-----------+----------+--------------+ SFJ      Full           Yes      Yes                                 +---------+---------------+---------+-----------+----------+--------------+ FV Prox  Full                                                        +---------+---------------+---------+-----------+----------+--------------+ FV Mid   Full                                                        +---------+---------------+---------+-----------+----------+--------------+ FV DistalFull                                                        +---------+---------------+---------+-----------+----------+--------------+ PFV      Full                                                         +---------+---------------+---------+-----------+----------+--------------+ POP      Full           Yes      Yes                                 +---------+---------------+---------+-----------+----------+--------------+ PTV      Full                                                        +---------+---------------+---------+-----------+----------+--------------+  PERO     Full                                                        +---------+---------------+---------+-----------+----------+--------------+     Summary: BILATERAL: - No evidence of deep vein thrombosis seen in the lower extremities, bilaterally. -No evidence of popliteal cyst, bilaterally.   *See table(s) above for measurements and observations. Electronically signed by Heath Lark on 11/07/2023 at 4:05:14 PM.    Final    CT ABDOMEN PELVIS WO CONTRAST Result Date: 11/06/2023 CLINICAL DATA:  Diverticulitis, complication suspected EXAM: CT ABDOMEN AND PELVIS WITHOUT CONTRAST TECHNIQUE: Multidetector CT imaging of the abdomen and pelvis was performed following the standard protocol without IV contrast. RADIATION DOSE REDUCTION: This exam was performed according to the departmental dose-optimization program which includes automated exposure control, adjustment of the mA and/or kV according to patient size and/or use of iterative reconstruction technique. COMPARISON:  10/31/2023 FINDINGS: Lower chest: Volume loss and opacity in the lower lungs consistent with atelectasis. There is a central line with tip at the upper right atrium. Coronary atherosclerosis. Hepatobiliary: No focal liver abnormality. Calcific in the intermediate density in the gallbladder attributed to stones. No biliary dilatation or pericholecystic edema. Pancreas: Unremarkable. Spleen: Unremarkable. Adrenals/Urinary Tract: Negative adrenals. No hydronephrosis or stone. Renal cortical thinning and lobulation at the left upper pole. 3 mm stone at the upper pole left  kidney. The urinary bladder is collapsed around a Foley catheter. Stomach/Bowel: Descending colostomy. Bowel anastomosis in the right abdomen. No evidence of bowel obstruction. Fluid collection with loculation appearance/peritoneal thickening along the left flank measuring up to 5 x 6 x 10 cm. Milder fluid accumulation in the ventral peritoneal space. Vascular/Lymphatic: Extensive atheromatous calcification of the aorta and iliacs. No mass or adenopathy. Reproductive:No pathologic findings. Other: No ascites or pneumoperitoneum. Musculoskeletal: No acute abnormalities.  T9 butterfly vertebra IMPRESSION: 1. Interval colostomy with fluid collection along the left gutter, dominant pocket measuring 10 x 6 x 5 cm. 2. No bowel obstruction. 3. Cholelithiasis and left nephrolithiasis. Electronically Signed   By: Tiburcio Pea M.D.   On: 11/06/2023 07:12   DG Chest Port 1 View Result Date: 11/03/2023 CLINICAL DATA:  Shortness of breath EXAM: PORTABLE CHEST - 1 VIEW COMPARISON:  11/11/2018 FINDINGS: Unchanged mild cardiomegaly and pulmonary vascular congestion. Interval worsening of left basilar opacity likely due to atelectasis. Lungs otherwise clear. Examination is limited due to expiratory phase of imaging. Interval placement of temporary right IJ hemodialysis catheter which terminates in the region of the right atrium. Nasogastric tube terminates in the left upper quadrant. IMPRESSION: Interval worsening of left basilar opacity which is likely due to atelectasis. Electronically Signed   By: Acquanetta Belling M.D.   On: 11/03/2023 07:22   IR Fluoro Guide CV Line Right Result Date: 11/01/2023 INDICATION: ESRD requiring HD. EXAM: NON-TUNNELED CENTRAL VENOUS HEMODIALYSIS CATHETER PLACEMENT WITH ULTRASOUND AND FLUOROSCOPIC GUIDANCE COMPARISON:  CT AP 10/31/2023. MEDICATIONS: Local anesthetic was administered. FLUOROSCOPY TIME:  Fluoroscopic dose; 35 mGy COMPLICATIONS: None immediate. PROCEDURE: Informed written consent was  obtained from the patient and/or patient's representative after a discussion of the risks, benefits, and alternatives to treatment. Questions regarding the procedure were encouraged and answered. The RIGHT neck and chest were prepped with chlorhexidine in a sterile fashion, and a sterile drape was applied covering the  operative field. Maximum barrier sterile technique with sterile gowns and gloves were used for the procedure. A timeout was performed prior to the initiation of the procedure. After the overlying soft tissues were anesthetized, a small venotomy incision was created and a micropuncture kit was utilized to access the external jugular vein. Real-time ultrasound guidance was utilized for vascular access including the acquisition of a permanent ultrasound image documenting patency of the accessed vessel. The microwire was utilized to measure appropriate catheter length. A stiff glidewire was advanced to the level of the IVC. Under fluoroscopic guidance, the venotomy was serially dilated, ultimately allowing placement of a 20 cm temporary Trialysis catheter with tip ultimately terminating within the superior aspect of the right atrium. Final catheter positioning was confirmed and documented with a spot radiographic image. The catheter aspirates and flushes normally. The catheter was flushed with appropriate volume heparin dwells. The catheter exit site was secured with a 2-0 Ethilon retention suture. A dressing was placed. The patient tolerated the procedure well without immediate post procedural complication. IMPRESSION: Successful placement of a RIGHT EXTERNAL jugular approach 20 cm non-tunneled dialysis catheter The tip of the catheter is positioned within the proximal RIGHT atrium. The catheter is ready for immediate use. PLAN: This catheter may be converted to a tunneled dialysis catheter at a later date as indicated. Roanna Banning, MD [facility]  Electronically Signed   By: Roanna Banning M.D.   On: 11/01/2023 18:15   IR US Guide Vasc Access Right Result Date: 11/01/2023 INDICATION: ESRD requiring HD. EXAM: NON-TUNNELED CENTRAL VENOUS HEMODIALYSIS CATHETER PLACEMENT WITH ULTRASOUND AND FLUOROSCOPIC GUIDANCE COMPARISON:  CT AP 10/31/2023. MEDICATIONS: Local anesthetic was administered. FLUOROSCOPY TIME:  Fluoroscopic dose; 35 mGy COMPLICATIONS: None immediate. PROCEDURE: Informed written consent was obtained from the patient and/or patient's representative after a discussion of the risks, benefits, and alternatives to treatment. Questions regarding the procedure were encouraged and answered. The RIGHT neck and chest were prepped with chlorhexidine in a sterile fashion, and a sterile drape was applied covering the operative field. Maximum barrier sterile technique with sterile gowns and gloves were used for the procedure. A timeout was performed prior to the initiation of the procedure. After the overlying soft tissues were anesthetized, a small venotomy incision was created and a micropuncture kit was utilized to access the external jugular vein. Real-time ultrasound guidance was utilized for vascular access including the acquisition of a permanent ultrasound image documenting patency of the accessed vessel. The microwire was utilized to measure appropriate catheter length. A stiff glidewire was advanced to the level of the IVC. Under fluoroscopic guidance, the venotomy was serially dilated, ultimately allowing placement of a 20 cm temporary Trialysis catheter with tip ultimately terminating within the superior aspect of the right atrium. Final catheter positioning was confirmed and documented with a spot radiographic image. The catheter aspirates and flushes normally. The catheter was flushed with appropriate volume heparin dwells. The catheter exit site was secured with a 2-0 Ethilon retention suture. A dressing was placed. The patient tolerated the  procedure well without immediate post procedural complication. IMPRESSION: Successful placement of a RIGHT EXTERNAL jugular approach 20 cm non-tunneled dialysis catheter The tip of the catheter is positioned within the proximal RIGHT atrium. The catheter is ready for immediate use. PLAN: This catheter may be converted to a tunneled dialysis catheter at a later date as indicated. Roanna Banning, MD [facility] Electronically Signed   By: Gerome Sam.D.  On: 11/01/2023 18:15   CT ABDOMEN PELVIS WO CONTRAST Result Date: 10/31/2023 CLINICAL DATA:  Diverticulitis, complication suspected. EXAM: CT ABDOMEN AND PELVIS WITHOUT CONTRAST TECHNIQUE: Multidetector CT imaging of the abdomen and pelvis was performed following the standard protocol without IV contrast. RADIATION DOSE REDUCTION: This exam was performed according to the departmental dose-optimization program which includes automated exposure control, adjustment of the mA and/or kV according to patient size and/or use of iterative reconstruction technique. COMPARISON:  10/27/2023. FINDINGS: Lower chest: Heart is enlarged and multi-vessel coronary artery calcifications are noted. Consolidation and patchy airspace disease is present at the lung bases bilaterally. Hepatobiliary: No focal liver abnormality is seen. Stones are present within the gallbladder. No biliary ductal dilatation. Pancreas: Unremarkable. No pancreatic ductal dilatation or surrounding inflammatory changes. Spleen: Normal in size without focal abnormality. Adrenals/Urinary Tract: The adrenal glands are within normal limits. Calcifications are present in the upper pole of the left kidney. There is a cyst in the lower pole of the right kidney. No hydronephrosis bilaterally. The bladder is unremarkable. Stomach/Bowel: The stomach is within normal limits. No bowel obstruction or pneumatosis is seen. Right hemicolectomy changes are noted. There  is diffuse colonic wall thickening. Scattered diverticula are present along the colon. A few small foci of free air are noted in the mesentery in the mid left abdomen, slightly increased from the previous exam. There is suggestion of contrast extravasation in the region. There is fine no definite abscess is seen. Vascular/Lymphatic: Aortic atherosclerosis. No enlarged abdominal or pelvic lymph nodes. Reproductive: No acute abnormality. Other: Mild ascites is noted in all 4 quadrants. A fat containing umbilical hernia is noted. There is mild anasarca. Musculoskeletal: The bony structures are stable. IMPRESSION: 1. Worsening diverticulitis of the descending colon with perforation and increasing free air and suspected oral contrast extravasation in the mid left abdomen. No definite abscess is seen. Surgical consultation is recommended. 2. Mild ascites. 3. Diffuse colonic wall thickening which may be due to associated inflammatory changes. 4. Patchy airspace disease at the lung bases with bilateral lower lobe consolidation. 5. Cholelithiasis. 6. Aortic atherosclerosis. Critical Value/emergent results were called by telephone at the time of interpretation on 10/31/2023 at 11:59 pm to provider Dr. Antionette Char, who verbally acknowledged these results. Electronically Signed   By: Thornell Sartorius M.D.   On: 10/31/2023 23:59   CT ABDOMEN PELVIS WO CONTRAST Result Date: 10/27/2023 CLINICAL DATA:  Acute nonlocalized abdominal pain. Chills and fever. Vomiting and diarrhea. EXAM: CT ABDOMEN AND PELVIS WITHOUT CONTRAST TECHNIQUE: Multidetector CT imaging of the abdomen and pelvis was performed following the standard protocol without IV contrast. RADIATION DOSE REDUCTION: This exam was performed according to the departmental dose-optimization program which includes automated exposure control, adjustment of the mA and/or kV according to patient size and/or use of iterative reconstruction technique. COMPARISON:  CT abdomen and pelvis  04/19/2021 FINDINGS: Lower chest: Bibasilar atelectasis.  No acute abnormality. Hepatobiliary: Cholelithiasis without evidence of acute cholecystitis. No biliary dilation. Unremarkable noncontrast appearance of the liver. Pancreas: Unremarkable. Spleen: Unremarkable. Adrenals/Urinary Tract: Normal adrenal glands. No urinary calculi or hydronephrosis. Unremarkable bladder. Stomach/Bowel: Stomach is within normal limits. No bowel obstruction. Colonic diverticulosis. Wall thickening, inflammatory stranding, and free fluid about the descending colon. Loosely organized fluid and gas about the inflamed descending colon (circa series 3/image 49-50) compatible with contained perforation. The adjacent jejunum is inflamed. Postoperative change about the colon with ileocolonic anastomosis in the right upper quadrant. Vascular/Lymphatic: Aortic atherosclerosis. No enlarged abdominal or pelvic lymph nodes. Reproductive: No  acute abnormality. Other: Fat containing periumbilical hernia. Musculoskeletal: No acute fracture. IMPRESSION: 1. Acute diverticulitis of the descending colon with contained perforation. 2. Inflamed jejunum adjacent to the perforated diverticulitis. 3.  Aortic Atherosclerosis (ICD10-I70.0). Critical Value/emergent results were called by telephone at the time of interpretation on 10/27/2023 at 9:22 pm to provider [facility] , who verbally acknowledged these results. Electronically Signed   By: Minerva Fester M.D.   On: 10/27/2023 21:24    Signature  -   Jeoffrey Massed M.D on 11/23/2023 at 12:46 PM   -  To page go to [email or web address]

## 2023-11-23 NOTE — Plan of Care (Signed)
  Problem: Education: Goal: Ability to describe self-care measures that may prevent or decrease complications (Diabetes Survival Skills Education) will improve Outcome: Progressing Goal: Individualized Educational Video(s) Outcome: Progressing   Problem: Coping: Goal: Ability to adjust to condition or change in health will improve Outcome: Progressing   Problem: Fluid Volume: Goal: Ability to maintain a balanced intake and output will improve Outcome: Progressing   Problem: Health Behavior/Discharge Planning: Goal: Ability to identify and utilize available resources and services will improve Outcome: Progressing Goal: Ability to manage health-related needs will improve Outcome: Progressing   Problem: Metabolic: Goal: Ability to maintain appropriate glucose levels will improve Outcome: Progressing   Problem: Nutritional: Goal: Maintenance of adequate nutrition will improve Outcome: Progressing Goal: Progress toward achieving an optimal weight will improve Outcome: Progressing   Problem: Skin Integrity: Goal: Risk for impaired skin integrity will decrease Outcome: Progressing   Problem: Tissue Perfusion: Goal: Adequacy of tissue perfusion will improve Outcome: Progressing   Problem: Education: Goal: Knowledge of General Education information will improve Description: Including pain rating scale, medication(s)/side effects and non-pharmacologic comfort measures Outcome: Progressing   Problem: Health Behavior/Discharge Planning: Goal: Ability to manage health-related needs will improve Outcome: Progressing   Problem: Clinical Measurements: Goal: Ability to maintain clinical measurements within normal limits will improve Outcome: Progressing Goal: Will remain free from infection Outcome: Progressing Goal: Diagnostic test results will improve Outcome: Progressing Goal: Respiratory complications will improve Outcome: Progressing Goal: Cardiovascular complication will  be avoided Outcome: Progressing   Problem: Activity: Goal: Risk for activity intolerance will decrease Outcome: Progressing   Problem: Nutrition: Goal: Adequate nutrition will be maintained Outcome: Progressing   Problem: Coping: Goal: Level of anxiety will decrease Outcome: Progressing   Problem: Elimination: Goal: Will not experience complications related to bowel motility Outcome: Progressing Goal: Will not experience complications related to urinary retention Outcome: Progressing   Problem: Pain Managment: Goal: General experience of comfort will improve and/or be controlled Outcome: Progressing   Problem: Safety: Goal: Ability to remain free from injury will improve Outcome: Progressing   Problem: Skin Integrity: Goal: Risk for impaired skin integrity will decrease Outcome: Progressing   Problem: Education: Goal: Knowledge of the prescribed therapeutic regimen will improve Outcome: Progressing   Problem: Coping: Goal: Ability to identify and develop effective coping behavior will improve Outcome: Progressing   Problem: Clinical Measurements: Goal: Quality of life will improve Outcome: Progressing   Problem: Respiratory: Goal: Verbalizations of increased ease of respirations will increase Outcome: Progressing   Problem: Role Relationship: Goal: Family's ability to cope with current situation will improve Outcome: Progressing Goal: Ability to verbalize concerns, feelings, and thoughts to partner or family member will improve Outcome: Progressing   Problem: Pain Management: Goal: Satisfaction with pain management regimen will improve Outcome: Progressing   Problem: Education: Goal: Knowledge of disease and its progression will improve Outcome: Progressing Goal: Individualized Educational Video(s) Outcome: Progressing   Problem: Fluid Volume: Goal: Compliance with measures to maintain balanced fluid volume will improve Outcome: Progressing    Problem: Health Behavior/Discharge Planning: Goal: Ability to manage health-related needs will improve Outcome: Progressing   Problem: Nutritional: Goal: Ability to make healthy dietary choices will improve Outcome: Progressing   Problem: Clinical Measurements: Goal: Complications related to the disease process, condition or treatment will be avoided or minimized Outcome: Progressing

## 2023-11-24 LAB — GLUCOSE, CAPILLARY
Glucose-Capillary: 151 mg/dL — ABNORMAL HIGH (ref 70–99)
Glucose-Capillary: 110 mg/dL — ABNORMAL HIGH (ref 70–99)
Glucose-Capillary: 131 mg/dL — ABNORMAL HIGH (ref 70–99)
Glucose-Capillary: 154 mg/dL — ABNORMAL HIGH (ref 70–99)

## 2023-11-24 NOTE — Plan of Care (Signed)
  Problem: Education: Goal: Ability to describe self-care measures that may prevent or decrease complications (Diabetes Survival Skills Education) will improve Outcome: Progressing Goal: Individualized Educational Video(s) Outcome: Progressing   Problem: Coping: Goal: Ability to adjust to condition or change in health will improve Outcome: Progressing   Problem: Fluid Volume: Goal: Ability to maintain a balanced intake and output will improve Outcome: Progressing   Problem: Health Behavior/Discharge Planning: Goal: Ability to identify and utilize available resources and services will improve Outcome: Progressing Goal: Ability to manage health-related needs will improve Outcome: Progressing   Problem: Metabolic: Goal: Ability to maintain appropriate glucose levels will improve Outcome: Progressing   Problem: Nutritional: Goal: Maintenance of adequate nutrition will improve Outcome: Progressing Goal: Progress toward achieving an optimal weight will improve Outcome: Progressing   Problem: Skin Integrity: Goal: Risk for impaired skin integrity will decrease Outcome: Progressing   Problem: Tissue Perfusion: Goal: Adequacy of tissue perfusion will improve Outcome: Progressing   Problem: Education: Goal: Knowledge of General Education information will improve Description: Including pain rating scale, medication(s)/side effects and non-pharmacologic comfort measures Outcome: Progressing   Problem: Health Behavior/Discharge Planning: Goal: Ability to manage health-related needs will improve Outcome: Progressing   Problem: Clinical Measurements: Goal: Ability to maintain clinical measurements within normal limits will improve Outcome: Progressing Goal: Will remain free from infection Outcome: Progressing Goal: Diagnostic test results will improve Outcome: Progressing Goal: Respiratory complications will improve Outcome: Progressing Goal: Cardiovascular complication will  be avoided Outcome: Progressing   Problem: Activity: Goal: Risk for activity intolerance will decrease Outcome: Progressing   Problem: Nutrition: Goal: Adequate nutrition will be maintained Outcome: Progressing   Problem: Coping: Goal: Level of anxiety will decrease Outcome: Progressing   Problem: Elimination: Goal: Will not experience complications related to bowel motility Outcome: Progressing Goal: Will not experience complications related to urinary retention Outcome: Progressing   Problem: Pain Managment: Goal: General experience of comfort will improve and/or be controlled Outcome: Progressing   Problem: Safety: Goal: Ability to remain free from injury will improve Outcome: Progressing   Problem: Skin Integrity: Goal: Risk for impaired skin integrity will decrease Outcome: Progressing   Problem: Education: Goal: Knowledge of the prescribed therapeutic regimen will improve Outcome: Progressing   Problem: Coping: Goal: Ability to identify and develop effective coping behavior will improve Outcome: Progressing   Problem: Clinical Measurements: Goal: Quality of life will improve Outcome: Progressing   Problem: Respiratory: Goal: Verbalizations of increased ease of respirations will increase Outcome: Progressing   Problem: Role Relationship: Goal: Family's ability to cope with current situation will improve Outcome: Progressing Goal: Ability to verbalize concerns, feelings, and thoughts to partner or family member will improve Outcome: Progressing   Problem: Pain Management: Goal: Satisfaction with pain management regimen will improve Outcome: Progressing   Problem: Education: Goal: Knowledge of disease and its progression will improve Outcome: Progressing Goal: Individualized Educational Video(s) Outcome: Progressing   Problem: Fluid Volume: Goal: Compliance with measures to maintain balanced fluid volume will improve Outcome: Progressing    Problem: Health Behavior/Discharge Planning: Goal: Ability to manage health-related needs will improve Outcome: Progressing   Problem: Nutritional: Goal: Ability to make healthy dietary choices will improve Outcome: Progressing   Problem: Clinical Measurements: Goal: Complications related to the disease process, condition or treatment will be avoided or minimized Outcome: Progressing

## 2023-11-24 NOTE — Progress Notes (Signed)
 Patient ID: [name], female   DOB: May 22, 1956, 68 y.o.   MRN: [id number] [facility] Progress Note   Assessment/ Plan:   1.  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  After initial refusal of wanting to pursue renal replacement therapy, she changed her mind and asked to start dialysis with the intention of undergoing surgery for contained perforation of acute diverticulitis.  -TDC w/ IR on 2/18 -Maintain dialysis on MWF schedule. Next treatment Monday/tomorrow 1st shift -Palliative care had been involved and patient wished to continue with dialysis for now.   -CLIP: [facility] 11:35am chair time. Pending [facility] -Would hold on permanent access creation for now given overall clinical picture, will likely need to revisit as an outpatient--discussed today and she is agreeable to outpatient eval and is open to this. 2.  Acute diverticulitis with contained perforation of descending colon: On Zosyn and now status post exploratory laparotomy with Hartman's resection for perforated diverticulitis/peritonitis (2/8).  Management per surgery and primary team 3.  Anion gap metabolic acidosis: Resolved with renal replacement therapy 4.  Anemia: Has received blood and IV iron. ESA also ordered, increased to qmonday 5. HTN: BP acceptable/stable, uf as tolerated 6. Dispo: eval for CIR.  Subjective:   Patient seen and examined bedside. Nauseated with dry heaves earlier, resolved with meds. Otherwise no complaints.   Objective:   BP (!) 119/48 (BP Location: Right Arm)   Pulse (!) 59   Temp 98.5 F (36.9 C) (Oral)   Resp 10   Ht 5' 7.01" (1.702 m)   Wt 86.8 kg   SpO2 94%   BMI 29.96 kg/m   Intake/Output Summary (Last 24 hours) at 11/24/2023 1213 Last data filed at 11/23/2023 2131 Gross per 24 hour  Intake 250 ml  Output --  Net  250 ml   Weight change:   Physical Exam: Gen: nad, in chair CVS: Normal rate, no rub Resp: Bilateral chest rise with no increased work of breathing, cta bl Abd: soft, colostomy bag in place Ext: no sig edema b/l LEs Neuro: awake, alert Dialysis access: right TDC c/d/i  Imaging: No results found.      Labs: BMET Recent Labs  Lab 11/18/23 0551 11/19/23 0834 11/20/23 0820 11/22/23 0835  NA 135 134* 133* 130*  K 3.5 3.5 3.7 3.8  CL 100 97* 96* 96*  CO2 22 26 24 25   GLUCOSE 118* 88 164* 162*  BUN 30* 17 24* 24*  CREATININE 8.20* 6.15* 7.84* 7.31*  CALCIUM 7.9* 8.2* 8.0* 7.8*  PHOS 4.8* 4.3 5.6* 4.8*   CBC Recent Labs  Lab 11/18/23 0551 11/19/23 0834 11/20/23 0820 11/22/23 0835  WBC 10.7* 10.2 10.3 8.9  HGB 9.1* 10.1* 9.3* 9.1*  HCT 28.8* 31.1* 29.8* 28.7*  MCV 85.0 84.1 85.6 85.2  PLT 338 227 232 191    Medications:     amLODipine  5 mg Oral Daily   apixaban  5 mg Oral BID   brimonidine  1 drop Both Eyes BID   carvedilol  6.25 mg Oral BID WC   Chlorhexidine Gluconate Cloth  6 each Topical Q0600   [START ON 11/25/2023] darbepoetin (ARANESP) injection - DIALYSIS  100 mcg Subcutaneous Q Mon-1800   docusate sodium  100 mg Oral BID   insulin aspart  0-15 Units Subcutaneous TID WC   insulin aspart  0-5 Units Subcutaneous QHS  insulin glargine  12 Units Subcutaneous Q24H   latanoprost  1 drop Both Eyes QHS   mouth rinse  15 mL Mouth Rinse 4 times per day   pantoprazole  40 mg Oral BID   polyethylene glycol  17 g Oral BID   senna  1 tablet Oral Daily   sodium chloride flush  10-40 mL Intracatheter Q12H   Carvell Hoeffner  11/24/2023, 12:13 PM

## 2023-11-24 NOTE — Progress Notes (Signed)
 PROGRESS NOTE        PATIENT DETAILS Name: [name] Age: 68 y.o. Sex: female Date of Birth: [birth year] Admit Date: 10/27/2023 Admitting Physician Dolly Rias, MD UJW:[id number], Devonne Doughty, NP  Brief Summary: Patient is a 68 y.o.  female with history of CKD 5, A-fib on Eliquis, CAD-who presented with abdominal pain-found to have acute diverticulitis with contained perforation involving the descending colon.  Hospital course complicated by worsening AKI-thought to have progressed to ESRD-requiring initiation of HD.  Unfortunately-even with conservative measures/antibiotics-she continued to worsen and ended up with exploratory laparotomy/Hartman's procedure on 2/8.  Significant events: 2/2>> admit to [facility] 2/6>> CT abdomen worsening diverticulitis with perforation 2/7>> transitioned to full comfort measures-as with worsening AKI-however multiple family members arrived-comfort care revoked-now full code-full scope of treatment including HD/laparotomy with colostomy.  General surgery/nephrology reconsulted.  IR placed [facility] and patient underwent first hemodialysis.  2/8>> remains n.p.o-colectomy/colostomy later today. 2/20>> wound VAC reapplied  Significant studies: 2/2>> CT abdomen/pelvis: Acute diverticulitis-descending colon with contained perforation 2/6>> CT abdomen/pelvis: Worsening diverticulitis of descending colon with perforation and increasing failure-suspected oral contrast extravasation left abdomen.  Significant microbiology data: 2/2>> COVID/influenza/RSV PCR: Negative  Procedures: 2/8>> by Dr. Freida Busman underwent exploratory laparotomy with Hartman's resection for perforated diverticulitis with diffuse peritonitis, colostomy in place. 2/13 - R IJ Trialysis catheter - IR 2/14 -left IJ Central line placed by IR for TNA(removed on 2/22) 11/12/2023. R.internal jugular tunneled HD catheter placement by  IR  Consults: Surgery Renal Palliative IR  Subjective:  Some nausea but no vomiting.  No other complaints  Objective: Vitals: Blood pressure (!) 119/48, pulse (!) 59, temperature 98.5 F (36.9 C), temperature source Oral, resp. rate 10, height 5' 7.01" (1.702 m), weight 86.8 kg, SpO2 94%.   Exam: Awake/alert-not in any distress Abdomen: Soft-wound VAC in place Extremities: No edema  Assessment/Plan: Acute diverticulitis with contained perforation of the descending colon  S/p Hartman's procedure on 2/8-completed a course of antibiotics-off TNA-appetite improving-tolerating advancement in diet.  Wound VAC reapplied 2/20-General Surgery following and directing postop care.  AKI on CKD 5 with progression to ESRD AKI likely hemodynamically mediated-has now likely progressed to ESRD.   HD initiated via right IJ temporary catheter, switched to right IJ trialysis catheter by IR on 11/07/2023>> tunneled HD catheter right IJ on 11/12/2023 by IR. CLIP in process, renal would like to hold on permanent access creation for now given her overall clinical picture.  CAD Currently no anginal symptoms Suspect not on antiplatelets as on anticoagulation with Eliquis/heparin Continue beta-blocker On Repatha injections as an outpatient.  PAF Sinus rhythm Continue Coreg Currently on Eliquis  Anemia of acute illness  Anemia of end-stage renal disease Multifactorial anemia-did 3 units of PRBC-last transfused on 2/19-Hb currently stable. Iron/Aranesp per nephrology service.   Hb currently stable-follow CBC closely.  HTN BP stable Continue Norvasc/Coreg  DM-2 (A1c 6.6 on 2/3) CBGs stable Semglee 12 units daily+ SSI.   Recent Labs    11/23/23 1741 11/23/23 2116 11/24/23 0947  GLUCAP 209* 131* 151*    Palliative care/goals of care Palliative input greatly appreciated Note-initially did not want to pursue surgery/hemodialysis-comfort measures was contemplated-comfort measures was  revoked-patient was a full code-but after continued discussion-she is now a DNR.  Class 1 Obesity: Estimated body mass index is 29.96 kg/m as calculated from the following:   Height as of this  encounter: 5' 7.01" (1.702 m).   Weight as of this encounter: 86.8 kg.    Code status:   Code Status: Limited: Do not attempt resuscitation (DNR) -DNR-LIMITED -Do Not Intubate/DNI    DVT Prophylaxis: Place and maintain sequential compression device Start: 11/06/23 0613 apixaban (ELIQUIS) tablet 5 mg IV Heparin  Family Communication: Discussed with patient, all her questions answered.  Disposition Plan: Status is: Inpatient-due to severity of illness  Disposition: SNF-awaiting placement.   Diet: Diet Order             Diet regular Room service appropriate? Yes; Fluid consistency: Thin  Diet effective now                   MEDICATIONS: Scheduled Meds:  amLODipine  5 mg Oral Daily   apixaban  5 mg Oral BID   brimonidine  1 drop Both Eyes BID   carvedilol  6.25 mg Oral BID WC   Chlorhexidine Gluconate Cloth  6 each Topical Q0600   [START ON 11/25/2023] darbepoetin (ARANESP) injection - DIALYSIS  100 mcg Subcutaneous Q Mon-1800   docusate sodium  100 mg Oral BID   insulin aspart  0-15 Units Subcutaneous TID WC   insulin aspart  0-5 Units Subcutaneous QHS   insulin glargine  12 Units Subcutaneous Q24H   latanoprost  1 drop Both Eyes QHS   mouth rinse  15 mL Mouth Rinse 4 times per day   pantoprazole  40 mg Oral BID   polyethylene glycol  17 g Oral BID   senna  1 tablet Oral Daily   sodium chloride flush  10-40 mL Intracatheter Q12H   Continuous Infusions:   PRN Meds:.acetaminophen **OR** acetaminophen, albuterol, hydrALAZINE, HYDROmorphone (DILAUDID) injection, melatonin, menthol-cetylpyridinium, ondansetron (ZOFRAN) IV, mouth rinse, oxyCODONE   I have personally reviewed following labs and imaging studies  LABORATORY DATA:   Data Review:    Recent Labs  Lab  11/18/23 0551 11/19/23 0834 11/20/23 0820 11/22/23 0835  WBC 10.7* 10.2 10.3 8.9  HGB 9.1* 10.1* 9.3* 9.1*  HCT 28.8* 31.1* 29.8* 28.7*  PLT 338 227 232 191  MCV 85.0 84.1 85.6 85.2  MCH 26.8 27.3 26.7 27.0  MCHC 31.6 32.5 31.2 31.7  RDW 16.9* 16.8* 16.6* 16.5*    Recent Labs  Lab 11/18/23 0551 11/19/23 0834 11/20/23 0820 11/22/23 0835  NA 135 134* 133* 130*  K 3.5 3.5 3.7 3.8  CL 100 97* 96* 96*  CO2 22 26 24 25   ANIONGAP 13 11 13 9   GLUCOSE 118* 88 164* 162*  BUN 30* 17 24* 24*  CREATININE 8.20* 6.15* 7.84* 7.31*  ALBUMIN 2.1* 2.2* 2.2* 2.1*  PHOS 4.8* 4.3 5.6* 4.8*  CALCIUM 7.9* 8.2* 8.0* 7.8*      Recent Labs  Lab 11/18/23 0551 11/19/23 0834 11/20/23 0820 11/22/23 0835  CALCIUM 7.9* 8.2* 8.0* 7.8*    --------------------------------------------------------------------------------------------------------------- Lab Results  Component Value Date   CHOL 134 12/21/2019   HDL 49 12/21/2019   LDLCALC 69 12/21/2019   TRIG 148 11/11/2023   CHOLHDL 2.7 12/21/2019    Lab Results  Component Value Date   HGBA1C 6.6 (H) 10/28/2023      Micro Results No results found for this or any previous visit (from the past 240 hours).    Radiology Reports DG Abd Portable 1V Result Date: 11/19/2023 CLINICAL DATA:  Abdominal pain.  Colostomy back in place. EXAM: PORTABLE ABDOMEN - 1 VIEW COMPARISON:  CT dated 11/06/2023. FINDINGS: No bowel dilatation or  evidence of obstruction. There is paucity of bowel air. Anastomotic staple line noted in the right lower quadrant. No free air. The osseous structures are intact. The soft tissues are unremarkable. IMPRESSION: Nonobstructive bowel gas pattern. Electronically Signed   By: Elgie Collard M.D.   On: 11/19/2023 09:59   IR Fluoro Guide CV Line Right Result Date: 11/12/2023 CLINICAL DATA:  End-stage renal disease and status post placement of non tunneled temporary dialysis catheter on 11/01/2023. The patient now requires  placement of a tunneled dialysis catheter for further longer-term hemodialysis. EXAM: TUNNELED CENTRAL VENOUS HEMODIALYSIS CATHETER PLACEMENT WITH ULTRASOUND AND FLUOROSCOPIC GUIDANCE ANESTHESIA/SEDATION: Moderate (conscious) sedation was employed during this procedure. A total of Versed 1.0 mg and Fentanyl 50 mcg was administered intravenously. Moderate Sedation Time: 16 minutes. The patient's level of consciousness and vital signs were monitored continuously by radiology nursing throughout the procedure under my direct supervision. MEDICATIONS: The patient is receiving IV Rocephin every 8 hours in the hospital and did not receive additional antibiotics for the procedure. FLUOROSCOPY: 24 seconds.  2.3 mGy. PROCEDURE: The procedure, risks, benefits, and alternatives were explained to the patient. Questions regarding the procedure were encouraged and answered. The patient understands and consents to the procedure. A timeout was performed prior to initiating the procedure. A non tunneled temporary right-sided dialysis catheter was removed prior to the procedure and pressure held at the exit site. The right neck and chest were prepped with chlorhexidine in a sterile fashion, and a sterile drape was applied covering the operative field. Maximum barrier sterile technique with sterile gowns and gloves were used for the procedure. Local anesthesia was provided with 1% lidocaine. Ultrasound was used to confirm patency of the right internal jugular vein. An ultrasound image was saved and recorded. After creating a small venotomy incision, a 21 gauge needle was advanced into the right internal jugular vein under direct, real-time ultrasound guidance. Ultrasound image documentation was performed. After securing guidewire access, an 8 Fr dilator was placed. A J-wire was kinked to measure appropriate catheter length. A Palindrome tunneled hemodialysis catheter measuring 19 cm from tip to cuff was chosen for placement. This was  tunneled in a retrograde fashion from the chest wall to the venotomy incision. At the venotomy, serial dilatation was performed and a 15 Fr peel-away sheath was placed over a guidewire. The catheter was then placed through the sheath and the sheath removed. Final catheter positioning was confirmed and documented with a fluoroscopic spot image. The catheter was aspirated, flushed with saline, and injected with appropriate volume heparin dwells. The venotomy incision was closed with subcuticular 4-0 Vicryl. Dermabond was applied to the incision. The catheter exit site was secured with 0-Prolene retention sutures. COMPLICATIONS: None.  No pneumothorax. FINDINGS: After catheter placement, the tip lies in the right atrium. The catheter aspirates normally and is ready for immediate use. IMPRESSION: Placement of tunneled hemodialysis catheter via new puncture of the right internal jugular vein after removal of the non tunneled catheter. The new catheter tip lies in the right atrium. The catheter is ready for immediate use. Electronically Signed   By: Irish Lack M.D.   On: 11/12/2023 10:45   IR US Guide Vasc Access Right Result Date: 11/12/2023 CLINICAL DATA:  End-stage renal disease and status post placement of non tunneled temporary dialysis catheter on 11/01/2023. The patient now requires placement of a tunneled dialysis catheter for further longer-term hemodialysis. EXAM: TUNNELED CENTRAL VENOUS HEMODIALYSIS CATHETER PLACEMENT WITH ULTRASOUND AND FLUOROSCOPIC GUIDANCE ANESTHESIA/SEDATION: Moderate (conscious) sedation was  employed during this procedure. A total of Versed 1.0 mg and Fentanyl 50 mcg was administered intravenously. Moderate Sedation Time: 16 minutes. The patient's level of consciousness and vital signs were monitored continuously by radiology nursing throughout the procedure under my direct supervision. MEDICATIONS: The patient is receiving IV Rocephin every 8 hours in the hospital and did not  receive additional antibiotics for the procedure. FLUOROSCOPY: 24 seconds.  2.3 mGy. PROCEDURE: The procedure, risks, benefits, and alternatives were explained to the patient. Questions regarding the procedure were encouraged and answered. The patient understands and consents to the procedure. A timeout was performed prior to initiating the procedure. A non tunneled temporary right-sided dialysis catheter was removed prior to the procedure and pressure held at the exit site. The right neck and chest were prepped with chlorhexidine in a sterile fashion, and a sterile drape was applied covering the operative field. Maximum barrier sterile technique with sterile gowns and gloves were used for the procedure. Local anesthesia was provided with 1% lidocaine. Ultrasound was used to confirm patency of the right internal jugular vein. An ultrasound image was saved and recorded. After creating a small venotomy incision, a 21 gauge needle was advanced into the right internal jugular vein under direct, real-time ultrasound guidance. Ultrasound image documentation was performed. After securing guidewire access, an 8 Fr dilator was placed. A J-wire was kinked to measure appropriate catheter length. A Palindrome tunneled hemodialysis catheter measuring 19 cm from tip to cuff was chosen for placement. This was tunneled in a retrograde fashion from the chest wall to the venotomy incision. At the venotomy, serial dilatation was performed and a 15 Fr peel-away sheath was placed over a guidewire. The catheter was then placed through the sheath and the sheath removed. Final catheter positioning was confirmed and documented with a fluoroscopic spot image. The catheter was aspirated, flushed with saline, and injected with appropriate volume heparin dwells. The venotomy incision was closed with subcuticular 4-0 Vicryl. Dermabond was applied to the incision. The catheter exit site was secured with 0-Prolene retention sutures. COMPLICATIONS:  None.  No pneumothorax. FINDINGS: After catheter placement, the tip lies in the right atrium. The catheter aspirates normally and is ready for immediate use. IMPRESSION: Placement of tunneled hemodialysis catheter via new puncture of the right internal jugular vein after removal of the non tunneled catheter. The new catheter tip lies in the right atrium. The catheter is ready for immediate use. Electronically Signed   By: Irish Lack M.D.   On: 11/12/2023 10:45   IR US Guide Vasc Access Left Result Date: 11/08/2023 INDICATION: ESRD.  IV antibiotics. EXAM: ULTRASOUND AND FLUOROSCOPIC GUIDED PLACEMENT OF TUNNELED CENTRAL VENOUS "Powerline" CATHETER MEDICATIONS: The patient was on scheduled IV antibiotics. ANESTHESIA/SEDATION: Local anesthetic was administered. The patient was continuously monitored during the procedure by the interventional radiology nurse under my direct supervision. FLUOROSCOPY TIME:  Fluoroscopic dose; 3.8 mGy COMPLICATIONS: None immediate. PROCEDURE: Informed written consent was obtained from the patient and/or patient's representative after a discussion of the risks, benefits, and alternatives to treatment. Questions regarding the procedure were encouraged and answered. The LEFT neck and chest were prepped with chlorhexidine in a sterile fashion, and a sterile drape was applied covering the operative field. Maximum barrier sterile technique with sterile gowns and gloves were used for the procedure. A timeout was performed prior to the initiation of the procedure. After the overlying soft tissues were anesthetized, a small venotomy incision was created and a micropuncture kit was utilized to access the internal  jugular vein. Real-time ultrasound guidance was utilized for vascular access including the acquisition of a permanent ultrasound image documenting patency of the accessed vessel. The microwire was utilized to measure appropriate catheter length. The micropuncture sheath was exchanged  for a peel-away sheath over a guidewire. A 5 Fr dual lumen tunneled central venous catheter measuring 33 cm was tunneled in a retrograde fashion from the anterior chest wall to the venotomy incision. The catheter was then placed through the peel-away sheath with tip ultimately positioned at the superior caval-atrial junction. Final catheter positioning was confirmed and documented with a spot radiographic image. The catheter aspirates and flushes normally. The catheter was flushed with appropriate volume heparin dwells. The catheter exit site was secured with a 3-0 Ethilon retention suture. The venotomy incision was closed with Dermabond. Dressings were applied. The patient tolerated the procedure well without immediate post procedural complication. FINDINGS: After catheter placement, the tip lies within the superior apect of the right atrium. The catheter aspirates and flushes normally and is ready for immediate use. IMPRESSION: Successful placement of 33 cm dual lumen tunneled "Powerline" central venous catheter via the LEFT internal jugular vein The tip of the catheter is positioned within the proximal RIGHT atrium. The catheter is ready for immediate use. Roanna Banning, MD [facility] Radiology Electronically Signed   By: Roanna Banning M.D.   On: 11/08/2023 18:10   IR Fluoro Guide CV Line Left Result Date: 11/08/2023 INDICATION: ESRD.  IV antibiotics. EXAM: ULTRASOUND AND FLUOROSCOPIC GUIDED PLACEMENT OF TUNNELED CENTRAL VENOUS "Powerline" CATHETER MEDICATIONS: The patient was on scheduled IV antibiotics. ANESTHESIA/SEDATION: Local anesthetic was administered. The patient was continuously monitored during the procedure by the interventional radiology nurse under my direct supervision. FLUOROSCOPY TIME:  Fluoroscopic dose; 3.8 mGy COMPLICATIONS: None immediate. PROCEDURE: Informed written consent was obtained from the patient and/or patient's representative after a  discussion of the risks, benefits, and alternatives to treatment. Questions regarding the procedure were encouraged and answered. The LEFT neck and chest were prepped with chlorhexidine in a sterile fashion, and a sterile drape was applied covering the operative field. Maximum barrier sterile technique with sterile gowns and gloves were used for the procedure. A timeout was performed prior to the initiation of the procedure. After the overlying soft tissues were anesthetized, a small venotomy incision was created and a micropuncture kit was utilized to access the internal jugular vein. Real-time ultrasound guidance was utilized for vascular access including the acquisition of a permanent ultrasound image documenting patency of the accessed vessel. The microwire was utilized to measure appropriate catheter length. The micropuncture sheath was exchanged for a peel-away sheath over a guidewire. A 5 Fr dual lumen tunneled central venous catheter measuring 33 cm was tunneled in a retrograde fashion from the anterior chest wall to the venotomy incision. The catheter was then placed through the peel-away sheath with tip ultimately positioned at the superior caval-atrial junction. Final catheter positioning was confirmed and documented with a spot radiographic image. The catheter aspirates and flushes normally. The catheter was flushed with appropriate volume heparin dwells. The catheter exit site was secured with a 3-0 Ethilon retention suture. The venotomy incision was closed with Dermabond. Dressings were applied. The patient tolerated the procedure well without immediate post procedural complication. FINDINGS: After catheter placement, the tip lies within the superior apect of the right atrium. The catheter aspirates and flushes normally and is ready for immediate use. IMPRESSION: Successful placement of 33 cm dual lumen tunneled "Powerline" central venous  catheter via the LEFT internal jugular vein The tip of the  catheter is positioned within the proximal RIGHT atrium. The catheter is ready for immediate use. Roanna Banning, MD [facility] Radiology Electronically Signed   By: Roanna Banning M.D.   On: 11/08/2023 18:10   VAS [place] LOWER EXTREMITY VENOUS (DVT) Result Date: 11/07/2023  Lower Venous DVT Study Patient Name:  [name] [facility]  Date of Exam:   11/07/2023 Medical Rec #: [id number]          Accession #:    [id number] Date of Birth: 03/12/1956          Patient Gender: F Patient Age:   12 years Exam Location:  [facility] Procedure:      VAS [place] LOWER EXTREMITY VENOUS (DVT) Referring Phys: Tresa Endo OSBORNE --------------------------------------------------------------------------------  Indications: Pain, stroke, and A-fib, anemia.  Comparison Study: No prior exam. Performing Technologist: Fernande Bras  Examination Guidelines: A complete evaluation includes B-mode imaging, spectral Doppler, color Doppler, and power Doppler as needed of all accessible portions of each vessel. Bilateral testing is considered an integral part of a complete examination. Limited examinations for reoccurring indications may be performed as noted. The reflux portion of the exam is performed with the patient in reverse Trendelenburg.  +---------+---------------+---------+-----------+----------+--------------+ RIGHT    CompressibilityPhasicitySpontaneityPropertiesThrombus Aging +---------+---------------+---------+-----------+----------+--------------+ CFV      Full           Yes      Yes                                 +---------+---------------+---------+-----------+----------+--------------+ SFJ      Full           Yes      Yes                                 +---------+---------------+---------+-----------+----------+--------------+ FV Prox  Full                                                         +---------+---------------+---------+-----------+----------+--------------+ FV Mid   Full                                                        +---------+---------------+---------+-----------+----------+--------------+ FV DistalFull                                                        +---------+---------------+---------+-----------+----------+--------------+ PFV      Full                                                        +---------+---------------+---------+-----------+----------+--------------+ POP      Full  Yes      Yes                                 +---------+---------------+---------+-----------+----------+--------------+ PTV      Full                                                        +---------+---------------+---------+-----------+----------+--------------+ PERO     Full                                                        +---------+---------------+---------+-----------+----------+--------------+   +---------+---------------+---------+-----------+----------+--------------+ LEFT     CompressibilityPhasicitySpontaneityPropertiesThrombus Aging +---------+---------------+---------+-----------+----------+--------------+ CFV      Full           Yes      Yes                                 +---------+---------------+---------+-----------+----------+--------------+ SFJ      Full           Yes      Yes                                 +---------+---------------+---------+-----------+----------+--------------+ FV Prox  Full                                                        +---------+---------------+---------+-----------+----------+--------------+ FV Mid   Full                                                        +---------+---------------+---------+-----------+----------+--------------+ FV DistalFull                                                         +---------+---------------+---------+-----------+----------+--------------+ PFV      Full                                                        +---------+---------------+---------+-----------+----------+--------------+ POP      Full           Yes      Yes                                 +---------+---------------+---------+-----------+----------+--------------+ PTV      Full                                                        +---------+---------------+---------+-----------+----------+--------------+  PERO     Full                                                        +---------+---------------+---------+-----------+----------+--------------+     Summary: BILATERAL: - No evidence of deep vein thrombosis seen in the lower extremities, bilaterally. -No evidence of popliteal cyst, bilaterally.   *See table(s) above for measurements and observations. Electronically signed by Heath Lark on 11/07/2023 at 4:05:14 PM.    Final    CT ABDOMEN PELVIS WO CONTRAST Result Date: 11/06/2023 CLINICAL DATA:  Diverticulitis, complication suspected EXAM: CT ABDOMEN AND PELVIS WITHOUT CONTRAST TECHNIQUE: Multidetector CT imaging of the abdomen and pelvis was performed following the standard protocol without IV contrast. RADIATION DOSE REDUCTION: This exam was performed according to the departmental dose-optimization program which includes automated exposure control, adjustment of the mA and/or kV according to patient size and/or use of iterative reconstruction technique. COMPARISON:  10/31/2023 FINDINGS: Lower chest: Volume loss and opacity in the lower lungs consistent with atelectasis. There is a central line with tip at the upper right atrium. Coronary atherosclerosis. Hepatobiliary: No focal liver abnormality. Calcific in the intermediate density in the gallbladder attributed to stones. No biliary dilatation or pericholecystic edema. Pancreas: Unremarkable. Spleen: Unremarkable. Adrenals/Urinary  Tract: Negative adrenals. No hydronephrosis or stone. Renal cortical thinning and lobulation at the left upper pole. 3 mm stone at the upper pole left kidney. The urinary bladder is collapsed around a Foley catheter. Stomach/Bowel: Descending colostomy. Bowel anastomosis in the right abdomen. No evidence of bowel obstruction. Fluid collection with loculation appearance/peritoneal thickening along the left flank measuring up to 5 x 6 x 10 cm. Milder fluid accumulation in the ventral peritoneal space. Vascular/Lymphatic: Extensive atheromatous calcification of the aorta and iliacs. No mass or adenopathy. Reproductive:No pathologic findings. Other: No ascites or pneumoperitoneum. Musculoskeletal: No acute abnormalities.  T9 butterfly vertebra IMPRESSION: 1. Interval colostomy with fluid collection along the left gutter, dominant pocket measuring 10 x 6 x 5 cm. 2. No bowel obstruction. 3. Cholelithiasis and left nephrolithiasis. Electronically Signed   By: Tiburcio Pea M.D.   On: 11/06/2023 07:12   DG Chest Port 1 View Result Date: 11/03/2023 CLINICAL DATA:  Shortness of breath EXAM: PORTABLE CHEST - 1 VIEW COMPARISON:  11/11/2018 FINDINGS: Unchanged mild cardiomegaly and pulmonary vascular congestion. Interval worsening of left basilar opacity likely due to atelectasis. Lungs otherwise clear. Examination is limited due to expiratory phase of imaging. Interval placement of temporary right IJ hemodialysis catheter which terminates in the region of the right atrium. Nasogastric tube terminates in the left upper quadrant. IMPRESSION: Interval worsening of left basilar opacity which is likely due to atelectasis. Electronically Signed   By: Acquanetta Belling M.D.   On: 11/03/2023 07:22   IR Fluoro Guide CV Line Right Result Date: 11/01/2023 INDICATION: ESRD requiring HD. EXAM: NON-TUNNELED CENTRAL VENOUS HEMODIALYSIS CATHETER PLACEMENT WITH ULTRASOUND AND FLUOROSCOPIC GUIDANCE COMPARISON:  CT AP 10/31/2023. MEDICATIONS:  Local anesthetic was administered. FLUOROSCOPY TIME:  Fluoroscopic dose; 35 mGy COMPLICATIONS: None immediate. PROCEDURE: Informed written consent was obtained from the patient and/or patient's representative after a discussion of the risks, benefits, and alternatives to treatment. Questions regarding the procedure were encouraged and answered. The RIGHT neck and chest were prepped with chlorhexidine in a sterile fashion, and a sterile drape was applied covering the  operative field. Maximum barrier sterile technique with sterile gowns and gloves were used for the procedure. A timeout was performed prior to the initiation of the procedure. After the overlying soft tissues were anesthetized, a small venotomy incision was created and a micropuncture kit was utilized to access the external jugular vein. Real-time ultrasound guidance was utilized for vascular access including the acquisition of a permanent ultrasound image documenting patency of the accessed vessel. The microwire was utilized to measure appropriate catheter length. A stiff glidewire was advanced to the level of the IVC. Under fluoroscopic guidance, the venotomy was serially dilated, ultimately allowing placement of a 20 cm temporary Trialysis catheter with tip ultimately terminating within the superior aspect of the right atrium. Final catheter positioning was confirmed and documented with a spot radiographic image. The catheter aspirates and flushes normally. The catheter was flushed with appropriate volume heparin dwells. The catheter exit site was secured with a 2-0 Ethilon retention suture. A dressing was placed. The patient tolerated the procedure well without immediate post procedural complication. IMPRESSION: Successful placement of a RIGHT EXTERNAL jugular approach 20 cm non-tunneled dialysis catheter The tip of the catheter is positioned within the proximal RIGHT atrium. The catheter is ready for immediate use. PLAN: This catheter may be  converted to a tunneled dialysis catheter at a later date as indicated. Roanna Banning, MD [facility] Electronically Signed   By: Roanna Banning M.D.   On: 11/01/2023 18:15   IR US Guide Vasc Access Right Result Date: 11/01/2023 INDICATION: ESRD requiring HD. EXAM: NON-TUNNELED CENTRAL VENOUS HEMODIALYSIS CATHETER PLACEMENT WITH ULTRASOUND AND FLUOROSCOPIC GUIDANCE COMPARISON:  CT AP 10/31/2023. MEDICATIONS: Local anesthetic was administered. FLUOROSCOPY TIME:  Fluoroscopic dose; 35 mGy COMPLICATIONS: None immediate. PROCEDURE: Informed written consent was obtained from the patient and/or patient's representative after a discussion of the risks, benefits, and alternatives to treatment. Questions regarding the procedure were encouraged and answered. The RIGHT neck and chest were prepped with chlorhexidine in a sterile fashion, and a sterile drape was applied covering the operative field. Maximum barrier sterile technique with sterile gowns and gloves were used for the procedure. A timeout was performed prior to the initiation of the procedure. After the overlying soft tissues were anesthetized, a small venotomy incision was created and a micropuncture kit was utilized to access the external jugular vein. Real-time ultrasound guidance was utilized for vascular access including the acquisition of a permanent ultrasound image documenting patency of the accessed vessel. The microwire was utilized to measure appropriate catheter length. A stiff glidewire was advanced to the level of the IVC. Under fluoroscopic guidance, the venotomy was serially dilated, ultimately allowing placement of a 20 cm temporary Trialysis catheter with tip ultimately terminating within the superior aspect of the right atrium. Final catheter positioning was confirmed and documented with a spot radiographic image. The catheter aspirates and flushes normally. The catheter was flushed with  appropriate volume heparin dwells. The catheter exit site was secured with a 2-0 Ethilon retention suture. A dressing was placed. The patient tolerated the procedure well without immediate post procedural complication. IMPRESSION: Successful placement of a RIGHT EXTERNAL jugular approach 20 cm non-tunneled dialysis catheter The tip of the catheter is positioned within the proximal RIGHT atrium. The catheter is ready for immediate use. PLAN: This catheter may be converted to a tunneled dialysis catheter at a later date as indicated. Roanna Banning, MD [facility] Electronically Signed   By: Gerome Sam.D.  On: 11/01/2023 18:15   CT ABDOMEN PELVIS WO CONTRAST Result Date: 10/31/2023 CLINICAL DATA:  Diverticulitis, complication suspected. EXAM: CT ABDOMEN AND PELVIS WITHOUT CONTRAST TECHNIQUE: Multidetector CT imaging of the abdomen and pelvis was performed following the standard protocol without IV contrast. RADIATION DOSE REDUCTION: This exam was performed according to the departmental dose-optimization program which includes automated exposure control, adjustment of the mA and/or kV according to patient size and/or use of iterative reconstruction technique. COMPARISON:  10/27/2023. FINDINGS: Lower chest: Heart is enlarged and multi-vessel coronary artery calcifications are noted. Consolidation and patchy airspace disease is present at the lung bases bilaterally. Hepatobiliary: No focal liver abnormality is seen. Stones are present within the gallbladder. No biliary ductal dilatation. Pancreas: Unremarkable. No pancreatic ductal dilatation or surrounding inflammatory changes. Spleen: Normal in size without focal abnormality. Adrenals/Urinary Tract: The adrenal glands are within normal limits. Calcifications are present in the upper pole of the left kidney. There is a cyst in the lower pole of the right kidney. No hydronephrosis bilaterally. The bladder is  unremarkable. Stomach/Bowel: The stomach is within normal limits. No bowel obstruction or pneumatosis is seen. Right hemicolectomy changes are noted. There is diffuse colonic wall thickening. Scattered diverticula are present along the colon. A few small foci of free air are noted in the mesentery in the mid left abdomen, slightly increased from the previous exam. There is suggestion of contrast extravasation in the region. There is fine no definite abscess is seen. Vascular/Lymphatic: Aortic atherosclerosis. No enlarged abdominal or pelvic lymph nodes. Reproductive: No acute abnormality. Other: Mild ascites is noted in all 4 quadrants. A fat containing umbilical hernia is noted. There is mild anasarca. Musculoskeletal: The bony structures are stable. IMPRESSION: 1. Worsening diverticulitis of the descending colon with perforation and increasing free air and suspected oral contrast extravasation in the mid left abdomen. No definite abscess is seen. Surgical consultation is recommended. 2. Mild ascites. 3. Diffuse colonic wall thickening which may be due to associated inflammatory changes. 4. Patchy airspace disease at the lung bases with bilateral lower lobe consolidation. 5. Cholelithiasis. 6. Aortic atherosclerosis. Critical Value/emergent results were called by telephone at the time of interpretation on 10/31/2023 at 11:59 pm to provider Dr. Antionette Char, who verbally acknowledged these results. Electronically Signed   By: Thornell Sartorius M.D.   On: 10/31/2023 23:59   CT ABDOMEN PELVIS WO CONTRAST Result Date: 10/27/2023 CLINICAL DATA:  Acute nonlocalized abdominal pain. Chills and fever. Vomiting and diarrhea. EXAM: CT ABDOMEN AND PELVIS WITHOUT CONTRAST TECHNIQUE: Multidetector CT imaging of the abdomen and pelvis was performed following the standard protocol without IV contrast. RADIATION DOSE REDUCTION: This exam was performed according to the departmental dose-optimization program which includes automated exposure  control, adjustment of the mA and/or kV according to patient size and/or use of iterative reconstruction technique. COMPARISON:  CT abdomen and pelvis 04/19/2021 FINDINGS: Lower chest: Bibasilar atelectasis.  No acute abnormality. Hepatobiliary: Cholelithiasis without evidence of acute cholecystitis. No biliary dilation. Unremarkable noncontrast appearance of the liver. Pancreas: Unremarkable. Spleen: Unremarkable. Adrenals/Urinary Tract: Normal adrenal glands. No urinary calculi or hydronephrosis. Unremarkable bladder. Stomach/Bowel: Stomach is within normal limits. No bowel obstruction. Colonic diverticulosis. Wall thickening, inflammatory stranding, and free fluid about the descending colon. Loosely organized fluid and gas about the inflamed descending colon (circa series 3/image [id number]) compatible with contained perforation. The adjacent jejunum is inflamed. Postoperative change about the colon with ileocolonic anastomosis in the right upper quadrant. Vascular/Lymphatic: Aortic atherosclerosis. No enlarged abdominal or pelvic lymph nodes. Reproductive: No  acute abnormality. Other: Fat containing periumbilical hernia. Musculoskeletal: No acute fracture. IMPRESSION: 1. Acute diverticulitis of the descending colon with contained perforation. 2. Inflamed jejunum adjacent to the perforated diverticulitis. 3.  Aortic Atherosclerosis (ICD10-I70.0). Critical Value/emergent results were called by telephone at the time of interpretation on 10/27/2023 at 9:22 pm to provider [facility] , who verbally acknowledged these results. Electronically Signed   By: Minerva Fester M.D.   On: 10/27/2023 21:24    Signature  -   Jeoffrey Massed M.D on 11/24/2023 at 10:53 AM   -  To page go to [email or web address]

## 2023-11-25 DIAGNOSIS — Z794 Long term (current) use of insulin: Secondary | ICD-10-CM

## 2023-11-25 DIAGNOSIS — E1169 Type 2 diabetes mellitus with other specified complication: Secondary | ICD-10-CM

## 2023-11-25 LAB — GLUCOSE, CAPILLARY
Glucose-Capillary: 154 mg/dL — ABNORMAL HIGH (ref 70–99)
Glucose-Capillary: 81 mg/dL (ref 70–99)
Glucose-Capillary: 112 mg/dL — ABNORMAL HIGH (ref 70–99)

## 2023-11-25 MED ORDER — PANTOPRAZOLE SODIUM 40 MG PO TBEC: 40.0000 mg | DELAYED_RELEASE_TABLET | Freq: Every day | ORAL | Status: DC

## 2023-11-25 MED ORDER — ALBUTEROL SULFATE (2.5 MG/3ML) 0.083% IN NEBU: 2.5000 mg | INHALATION_SOLUTION | RESPIRATORY_TRACT | Status: AC | PRN

## 2023-11-25 MED ORDER — INSULIN GLARGINE 100 UNIT/ML ~~LOC~~ SOLN: 12.0000 [IU] | Freq: Every day | SUBCUTANEOUS | Status: DC

## 2023-11-25 MED ORDER — NOVOLOG FLEXPEN 100 UNIT/ML ~~LOC~~ SOPN: PEN_INJECTOR | SUBCUTANEOUS | Status: DC

## 2023-11-25 MED ORDER — SENNA 8.6 MG PO TABS: 1.0000 | ORAL_TABLET | Freq: Every day | ORAL | Status: DC

## 2023-11-25 MED ORDER — POLYETHYLENE GLYCOL 3350 17 G PO PACK: 17.0000 g | PACK | Freq: Every day | ORAL | Status: DC

## 2023-11-25 NOTE — Discharge Summary (Signed)
 PATIENT DETAILS Name: [name] Age: 68 y.o. Sex: female Date of Birth: 06-Nov-1955 MRN: [id number]. Admitting Physician: Dolly Rias, MD WUJ:[id number], Devonne Doughty, NP  Admit Date: 10/27/2023 Discharge date: 11/25/2023  Recommendations for Outpatient Follow-up:  Follow up with PCP in 1-2 weeks Please obtain CMP/CBC in one week  Admitted From:  Home  Disposition: Skilled nursing facility   Discharge Condition: good  CODE STATUS:   Code Status: Limited: Do not attempt resuscitation (DNR) -DNR-LIMITED -Do Not Intubate/DNI    Diet recommendation:  Diet Order             Diet - low sodium heart healthy           Diet Carb Modified           Diet regular Room service appropriate? Yes; Fluid consistency: Thin  Diet effective now                    Brief Summary: Patient is a 68 y.o.  female with history of CKD 5, A-fib on Eliquis, CAD-who presented with abdominal pain-found to have acute diverticulitis with contained perforation involving the descending colon.  Hospital course complicated by worsening AKI-thought to have progressed to ESRD-requiring initiation of HD.  Unfortunately-even with conservative measures/antibiotics-she continued to worsen and ended up with exploratory laparotomy/Hartman's procedure on 2/8.   Significant events: 2/2>> admit to [facility] 2/6>> CT abdomen worsening diverticulitis with perforation 2/7>> transitioned to full comfort measures-as with worsening AKI-however multiple family members arrived-comfort care revoked-now full code-full scope of treatment including HD/laparotomy with colostomy.  General surgery/nephrology reconsulted.  IR placed [facility] and patient underwent first hemodialysis.  2/8>> remains n.p.o-colectomy/colostomy later today. 2/20>> wound VAC reapplied   Significant studies: 2/2>> CT abdomen/pelvis: Acute diverticulitis-descending colon with contained perforation 2/6>> CT abdomen/pelvis: Worsening diverticulitis of descending  colon with perforation and increasing failure-suspected oral contrast extravasation left abdomen.   Significant microbiology data: 2/2>> COVID/influenza/RSV PCR: Negative   Procedures: 2/8>> by Dr. Freida Busman underwent exploratory laparotomy with Hartman's resection for perforated diverticulitis with diffuse peritonitis, colostomy in place. 2/13 - R IJ Trialysis catheter - IR 2/14 -left IJ Central line placed by IR for TNA(removed on 2/22) 11/12/2023. R.internal jugular tunneled HD catheter placement by IR   Consults: Surgery Renal Palliative IR  Brief Hospital Course: Acute diverticulitis with contained perforation of the descending colon  S/p Hartman's procedure on 2/8-completed a course of antibiotics-off TNA-appetite improving-tolerating advancement in diet.  Wound VAC reapplied 2/20-General Surgery followed closely-please ensure follow-up with general surgery postdischarge..   AKI on CKD 5 with progression to ESRD AKI likely hemodynamically mediated-has now likely progressed to ESRD.   HD initiated via right IJ temporary catheter, switched to right IJ trialysis catheter by IR on 11/07/2023>> tunneled HD catheter right IJ on 11/12/2023 by IR. Outpatient hemodialysis arrangement has been completed by [facility] team-Per nephrology-they would like to hold off on permanent access-given her overall clinical picture.   CAD Currently no anginal symptoms Suspect not on antiplatelets as on anticoagulation with Eliquis/heparin Continue beta-blocker On Repatha injections as an outpatient.   PAF Sinus rhythm Continue Coreg Currently on Eliquis   Anemia of acute illness  Anemia of end-stage renal disease Multifactorial anemia-did 3 units of PRBC-last transfused on 2/19-Hb currently stable. Iron/Aranesp per nephrology service.   Hb currently stable-follow CBC closely.   HTN BP stable Continue Norvasc/Coreg   DM-2 (A1c 6.6 on 2/3) CBGs stable Semglee 12 units daily+ SSI.  Recent Labs     11/24/23 1248  11/24/23 1619 11/24/23 2104  GLUCAP 110* 131* 154*     Palliative care/goals of care Palliative input greatly appreciated Note-initially did not want to pursue surgery/hemodialysis-comfort measures was contemplated-comfort measures was revoked-patient was a full code-but after continued discussion-she is now a DNR.   Class 1 Obesity: Estimated body mass index is 29.96 kg/m as calculated from the following:   Height as of this encounter: 5' 7.01" (1.702 m).   Weight as of this encounter: 86.8 kg.  Nutrition Status: Nutrition Problem: Increased nutrient needs Etiology: post-op healing Signs/Symptoms: estimated needs Interventions: TPN    Discharge Diagnoses:  Principal Problem:   Diverticulitis of large intestine with complication Active Problems:   Chronic kidney disease, stage 5 (HCC)   Discharge Instructions:  Activity:  As tolerated with Full fall precautions use walker/cane & assistance as needed  Discharge Instructions     Call MD for:  persistant nausea and vomiting   Complete by: As directed    Call MD for:  severe uncontrolled pain   Complete by: As directed    Diet - low sodium heart healthy   Complete by: As directed    Diet Carb Modified   Complete by: As directed    Discharge instructions   Complete by: As directed    Follow with Primary MD  Hillery Aldo, NP in 1-2 weeks  Please get a complete blood count and chemistry panel checked by your Primary MD at your next visit, and again as instructed by your Primary MD.  Get Medicines reviewed and adjusted: Please take all your medications with you for your next visit with your Primary MD  Laboratory/radiological data: Please request your Primary MD to go over all hospital tests and procedure/radiological results at the follow up, please ask your Primary MD to get all Hospital records sent to his/her office.  In some cases, they will be blood work, cultures and biopsy results pending at  the time of your discharge. Please request that your primary care M.D. follows up on these results.  Also Note the following: If you experience worsening of your admission symptoms, develop shortness of breath, life threatening emergency, suicidal or homicidal thoughts you must seek medical attention immediately by calling 911 or calling your MD immediately  if symptoms less severe.  You must read complete instructions/literature along with all the possible adverse reactions/side effects for all the Medicines you take and that have been prescribed to you. Take any new Medicines after you have completely understood and accpet all the possible adverse reactions/side effects.   Do not drive when taking Pain medications or sleeping medications (Benzodaizepines)  Do not take more than prescribed Pain, Sleep and Anxiety Medications. It is not advisable to combine anxiety,sleep and pain medications without talking with your primary care practitioner  Special Instructions: If you have smoked or chewed Tobacco  in the last 2 yrs please stop smoking, stop any regular Alcohol  and or any Recreational drug use.  Wear Seat belts while driving.  Please note: You were cared for by a hospitalist during your hospital stay. Once you are discharged, your primary care physician will handle any further medical issues. Please note that NO REFILLS for any discharge medications will be authorized once you are discharged, as it is imperative that you return to your primary care physician (or establish a relationship with a primary care physician if you do not have one) for your post hospital discharge needs so that they can reassess your need for medications and  monitor your lab values.   Discharge wound care:   Complete by: As directed    Negative pressure wound therapy  Do not change dressing      Comments: Change VAC dressing Q MON/THUR; WOC to change with ostomy teaching and black foam.([facility]WSON # [id number])   As well as  device and canister. [facility] # [id number])   Increase activity slowly   Complete by: As directed       Allergies as of 11/25/2023       Reactions   Nsaids Other (See Comments)   CKD stage 4   Lisinopril Other (See Comments)   coughing   Peanut-containing Drug Products Itching, Other (See Comments)   GI intolerance - diarrhea        Medication List     STOP taking these medications    furosemide 40 MG tablet Commonly known as: LASIX   hydrALAZINE 50 MG tablet Commonly known as: APRESOLINE   insulin regular 100 units/mL injection Commonly known as: NovoLIN R   isosorbide mononitrate 30 MG 24 hr tablet Commonly known as: IMDUR       TAKE these medications    Accu-Chek Aviva Plus test strip Generic drug: glucose blood CHECK BLOOD GLUCOSE THREE TIMES DAILY   Accu-Chek Aviva Plus w/Device Kit CHECK BLOOD GLUCOSE THREE TIMES DAILY   accu-chek softclix lancets Fill for 1 month for TID testing. Use as instructed   albuterol (2.5 MG/3ML) 0.083% nebulizer solution Commonly known as: PROVENTIL Take 3 mLs (2.5 mg total) by nebulization every 2 (two) hours as needed for wheezing.   amLODipine 5 MG tablet Commonly known as: NORVASC Take 5 mg by mouth daily.   apixaban 2.5 MG Tabs tablet Commonly known as: Eliquis Take 1 tablet (2.5 mg total) by mouth 2 (two) times daily. What changed: how much to take   brimonidine 0.2 % ophthalmic solution Commonly known as: ALPHAGAN Place 1 drop into both eyes 2 (two) times daily.   calcitRIOL 0.25 MCG capsule Commonly known as: ROCALTROL TAKE 1 CAPSULE (0.25 MCG TOTAL) EVERY MONDAY, WEDNESDAY, AND FRIDAY What changed: See the new instructions.   carvedilol 12.5 MG tablet Commonly known as: COREG TAKE 1 TABLET (12.5 MG TOTAL) BY MOUTH 2 (TWO) TIMES DAILY WITH A MEAL.   Droplet Insulin Syringe 31G X 5/16" 0.5 ML Misc Generic drug: Insulin Syringe-Needle U-100 USE FOUR TIMES DAILY FOR INJECTIONS   insulin glargine 100  UNIT/ML injection Commonly known as: Lantus Inject 0.12 mLs (12 Units total) into the skin daily. Please schedule appointment before next refill. What changed: how much to take   Insulin Syringes (Disposable) U-100 0.5 ML Misc 4x daily injections   latanoprost 0.005 % ophthalmic solution Commonly known as: XALATAN Place 1 drop into both eyes at bedtime.   melatonin 5 MG Tabs Take 10 mg by mouth at bedtime as needed (for insomnia).   multivitamin with minerals Tabs tablet Take 1 tablet by mouth daily.   NEEDLE (DISP) 30 G 30G X 1/2" Misc Commonly known as: BD Disp Needles For 4x daily injections   NovoLOG FlexPen 100 UNIT/ML FlexPen Generic drug: insulin aspart 0-9 Units, Subcutaneous, 3 times daily with meals CBG < 70: Implement Hypoglycemia measures CBG 70 - 120: 0 units CBG 121 - 150: 1 unit CBG 151 - 200: 2 units CBG 201 - 250: 3 units CBG 251 - 300: 5 units CBG 301 - 350: 7 units CBG 351 - 400: 9 units CBG > 400: call MD   pantoprazole  40 MG tablet Commonly known as: PROTONIX Take 1 tablet (40 mg total) by mouth daily.   polyethylene glycol 17 g packet Commonly known as: MIRALAX / GLYCOLAX Take 17 g by mouth daily.   Repatha SureClick 140 MG/ML Soaj Generic drug: Evolocumab Inject 140 mg into the skin every 14 (fourteen) days.   senna 8.6 MG Tabs tablet Commonly known as: SENOKOT Take 1 tablet (8.6 mg total) by mouth daily.   VITAMIN C PO Take 1 tablet by mouth daily.   VITAMIN D (CHOLECALCIFEROL) PO Take 1 tablet by mouth daily.               Discharge Care Instructions  (From admission, onward)           Start     Ordered   11/25/23 0000  Discharge wound care:       Comments: Negative pressure wound therapy  Do not change dressing      Comments: Change VAC dressing Q MON/THUR; WOC to change with ostomy teaching and black foam.([facility] # [id number])   As well as device and canister. [facility] # [id number])   11/25/23 9604            Contact  information for follow-up providers     Fritzi Mandes, MD Follow up on 12/18/2023.   Specialty: General Surgery Why: 9:4am0, Arrive 30 minutes prior to your appointment time, Please bring your insurance card and photo ID Contact information: [address] [place] [address] [phone]         [facility] Go on 11/22/2023.   Why: Schedule is Monday, Wednesday, Friday with 11:35 am chair time. On Friday (2/28), please arrive at 10:45 am to complete paperwork prior to treatment. Contact information: [address] [place] [address] [phone]              Contact information for after-discharge care     Destination     [facility] Preferred SNF .   Service: Skilled Nursing Contact information: [address] [place] [address] [phone]                    Allergies  Allergen Reactions   Nsaids Other (See Comments)    CKD stage 4   Lisinopril Other (See Comments)    coughing   Peanut-Containing Drug Products Itching and Other (See Comments)    GI intolerance - diarrhea     Other Procedures/Studies: DG Abd Portable 1V Result Date: 11/19/2023 CLINICAL DATA:  Abdominal pain.  Colostomy back in place. EXAM: PORTABLE ABDOMEN - 1 VIEW COMPARISON:  CT dated 11/06/2023. FINDINGS: No bowel dilatation or evidence of obstruction. There is paucity of bowel air. Anastomotic staple line noted in the right lower quadrant. No free air. The osseous structures are intact. The soft tissues are unremarkable. IMPRESSION: Nonobstructive bowel gas pattern. Electronically Signed   By: Elgie Collard M.D.   On: 11/19/2023 09:59   IR Fluoro Guide CV Line Right Result Date: 11/12/2023 CLINICAL DATA:  End-stage renal disease and status post placement of non tunneled temporary dialysis catheter on 11/01/2023. The patient now requires placement of a tunneled dialysis catheter for further  longer-term hemodialysis. EXAM: TUNNELED CENTRAL VENOUS HEMODIALYSIS CATHETER PLACEMENT WITH ULTRASOUND AND FLUOROSCOPIC GUIDANCE ANESTHESIA/SEDATION: Moderate (conscious) sedation was employed during this procedure. A total of Versed 1.0 mg and Fentanyl 50 mcg was administered intravenously. Moderate Sedation Time: 16 minutes. The patient's level of consciousness  and vital signs were monitored continuously by radiology nursing throughout the procedure under my direct supervision. MEDICATIONS: The patient is receiving IV Rocephin every 8 hours in the hospital and did not receive additional antibiotics for the procedure. FLUOROSCOPY: 24 seconds.  2.3 mGy. PROCEDURE: The procedure, risks, benefits, and alternatives were explained to the patient. Questions regarding the procedure were encouraged and answered. The patient understands and consents to the procedure. A timeout was performed prior to initiating the procedure. A non tunneled temporary right-sided dialysis catheter was removed prior to the procedure and pressure held at the exit site. The right neck and chest were prepped with chlorhexidine in a sterile fashion, and a sterile drape was applied covering the operative field. Maximum barrier sterile technique with sterile gowns and gloves were used for the procedure. Local anesthesia was provided with 1% lidocaine. Ultrasound was used to confirm patency of the right internal jugular vein. An ultrasound image was saved and recorded. After creating a small venotomy incision, a 21 gauge needle was advanced into the right internal jugular vein under direct, real-time ultrasound guidance. Ultrasound image documentation was performed. After securing guidewire access, an 8 Fr dilator was placed. A J-wire was kinked to measure appropriate catheter length. A Palindrome tunneled hemodialysis catheter measuring 19 cm from tip to cuff was chosen for placement. This was tunneled in a retrograde fashion from the chest wall to  the venotomy incision. At the venotomy, serial dilatation was performed and a 15 Fr peel-away sheath was placed over a guidewire. The catheter was then placed through the sheath and the sheath removed. Final catheter positioning was confirmed and documented with a fluoroscopic spot image. The catheter was aspirated, flushed with saline, and injected with appropriate volume heparin dwells. The venotomy incision was closed with subcuticular 4-0 Vicryl. Dermabond was applied to the incision. The catheter exit site was secured with 0-Prolene retention sutures. COMPLICATIONS: None.  No pneumothorax. FINDINGS: After catheter placement, the tip lies in the right atrium. The catheter aspirates normally and is ready for immediate use. IMPRESSION: Placement of tunneled hemodialysis catheter via new puncture of the right internal jugular vein after removal of the non tunneled catheter. The new catheter tip lies in the right atrium. The catheter is ready for immediate use. Electronically Signed   By: Irish Lack M.D.   On: 11/12/2023 10:45   IR US Guide Vasc Access Right Result Date: 11/12/2023 CLINICAL DATA:  End-stage renal disease and status post placement of non tunneled temporary dialysis catheter on 11/01/2023. The patient now requires placement of a tunneled dialysis catheter for further longer-term hemodialysis. EXAM: TUNNELED CENTRAL VENOUS HEMODIALYSIS CATHETER PLACEMENT WITH ULTRASOUND AND FLUOROSCOPIC GUIDANCE ANESTHESIA/SEDATION: Moderate (conscious) sedation was employed during this procedure. A total of Versed 1.0 mg and Fentanyl 50 mcg was administered intravenously. Moderate Sedation Time: 16 minutes. The patient's level of consciousness and vital signs were monitored continuously by radiology nursing throughout the procedure under my direct supervision. MEDICATIONS: The patient is receiving IV Rocephin every 8 hours in the hospital and did not receive additional antibiotics for the procedure.  FLUOROSCOPY: 24 seconds.  2.3 mGy. PROCEDURE: The procedure, risks, benefits, and alternatives were explained to the patient. Questions regarding the procedure were encouraged and answered. The patient understands and consents to the procedure. A timeout was performed prior to initiating the procedure. A non tunneled temporary right-sided dialysis catheter was removed prior to the procedure and pressure held at the exit site. The right neck and chest were prepped with chlorhexidine  in a sterile fashion, and a sterile drape was applied covering the operative field. Maximum barrier sterile technique with sterile gowns and gloves were used for the procedure. Local anesthesia was provided with 1% lidocaine. Ultrasound was used to confirm patency of the right internal jugular vein. An ultrasound image was saved and recorded. After creating a small venotomy incision, a 21 gauge needle was advanced into the right internal jugular vein under direct, real-time ultrasound guidance. Ultrasound image documentation was performed. After securing guidewire access, an 8 Fr dilator was placed. A J-wire was kinked to measure appropriate catheter length. A Palindrome tunneled hemodialysis catheter measuring 19 cm from tip to cuff was chosen for placement. This was tunneled in a retrograde fashion from the chest wall to the venotomy incision. At the venotomy, serial dilatation was performed and a 15 Fr peel-away sheath was placed over a guidewire. The catheter was then placed through the sheath and the sheath removed. Final catheter positioning was confirmed and documented with a fluoroscopic spot image. The catheter was aspirated, flushed with saline, and injected with appropriate volume heparin dwells. The venotomy incision was closed with subcuticular 4-0 Vicryl. Dermabond was applied to the incision. The catheter exit site was secured with 0-Prolene retention sutures. COMPLICATIONS: None.  No pneumothorax. FINDINGS: After catheter  placement, the tip lies in the right atrium. The catheter aspirates normally and is ready for immediate use. IMPRESSION: Placement of tunneled hemodialysis catheter via new puncture of the right internal jugular vein after removal of the non tunneled catheter. The new catheter tip lies in the right atrium. The catheter is ready for immediate use. Electronically Signed   By: Irish Lack M.D.   On: 11/12/2023 10:45   IR US Guide Vasc Access Left Result Date: 11/08/2023 INDICATION: ESRD.  IV antibiotics. EXAM: ULTRASOUND AND FLUOROSCOPIC GUIDED PLACEMENT OF TUNNELED CENTRAL VENOUS "Powerline" CATHETER MEDICATIONS: The patient was on scheduled IV antibiotics. ANESTHESIA/SEDATION: Local anesthetic was administered. The patient was continuously monitored during the procedure by the interventional radiology nurse under my direct supervision. FLUOROSCOPY TIME:  Fluoroscopic dose; 3.8 mGy COMPLICATIONS: None immediate. PROCEDURE: Informed written consent was obtained from the patient and/or patient's representative after a discussion of the risks, benefits, and alternatives to treatment. Questions regarding the procedure were encouraged and answered. The LEFT neck and chest were prepped with chlorhexidine in a sterile fashion, and a sterile drape was applied covering the operative field. Maximum barrier sterile technique with sterile gowns and gloves were used for the procedure. A timeout was performed prior to the initiation of the procedure. After the overlying soft tissues were anesthetized, a small venotomy incision was created and a micropuncture kit was utilized to access the internal jugular vein. Real-time ultrasound guidance was utilized for vascular access including the acquisition of a permanent ultrasound image documenting patency of the accessed vessel. The microwire was utilized to measure appropriate catheter length. The micropuncture sheath was exchanged for a peel-away sheath over a guidewire. A 5 Fr  dual lumen tunneled central venous catheter measuring 33 cm was tunneled in a retrograde fashion from the anterior chest wall to the venotomy incision. The catheter was then placed through the peel-away sheath with tip ultimately positioned at the superior caval-atrial junction. Final catheter positioning was confirmed and documented with a spot radiographic image. The catheter aspirates and flushes normally. The catheter was flushed with appropriate volume heparin dwells. The catheter exit site was secured with a 3-0 Ethilon retention suture. The venotomy incision was closed with Dermabond. Dressings  were applied. The patient tolerated the procedure well without immediate post procedural complication. FINDINGS: After catheter placement, the tip lies within the superior apect of the right atrium. The catheter aspirates and flushes normally and is ready for immediate use. IMPRESSION: Successful placement of 33 cm dual lumen tunneled "Powerline" central venous catheter via the LEFT internal jugular vein The tip of the catheter is positioned within the proximal RIGHT atrium. The catheter is ready for immediate use. Roanna Banning, MD [facility] [place] [facility] Electronically Signed   By: Roanna Banning M.D.   On: 11/08/2023 18:10   IR Fluoro Guide CV Line Left Result Date: 11/08/2023 INDICATION: ESRD.  IV antibiotics. EXAM: ULTRASOUND AND FLUOROSCOPIC GUIDED PLACEMENT OF TUNNELED CENTRAL VENOUS "Powerline" CATHETER MEDICATIONS: The patient was on scheduled IV antibiotics. ANESTHESIA/SEDATION: Local anesthetic was administered. The patient was continuously monitored during the procedure by the interventional radiology nurse under my direct supervision. FLUOROSCOPY TIME:  Fluoroscopic dose; 3.8 mGy COMPLICATIONS: None immediate. PROCEDURE: Informed written consent was obtained from the patient and/or patient's representative after a discussion of the risks, benefits, and  alternatives to treatment. Questions regarding the procedure were encouraged and answered. The LEFT neck and chest were prepped with chlorhexidine in a sterile fashion, and a sterile drape was applied covering the operative field. Maximum barrier sterile technique with sterile gowns and gloves were used for the procedure. A timeout was performed prior to the initiation of the procedure. After the overlying soft tissues were anesthetized, a small venotomy incision was created and a micropuncture kit was utilized to access the internal jugular vein. Real-time ultrasound guidance was utilized for vascular access including the acquisition of a permanent ultrasound image documenting patency of the accessed vessel. The microwire was utilized to measure appropriate catheter length. The micropuncture sheath was exchanged for a peel-away sheath over a guidewire. A 5 Fr dual lumen tunneled central venous catheter measuring 33 cm was tunneled in a retrograde fashion from the anterior chest wall to the venotomy incision. The catheter was then placed through the peel-away sheath with tip ultimately positioned at the superior caval-atrial junction. Final catheter positioning was confirmed and documented with a spot radiographic image. The catheter aspirates and flushes normally. The catheter was flushed with appropriate volume heparin dwells. The catheter exit site was secured with a 3-0 Ethilon retention suture. The venotomy incision was closed with Dermabond. Dressings were applied. The patient tolerated the procedure well without immediate post procedural complication. FINDINGS: After catheter placement, the tip lies within the superior apect of the right atrium. The catheter aspirates and flushes normally and is ready for immediate use. IMPRESSION: Successful placement of 33 cm dual lumen tunneled "Powerline" central venous catheter via the LEFT internal jugular vein The tip of the catheter is positioned within the proximal  RIGHT atrium. The catheter is ready for immediate use. Roanna Banning, MD [facility] Radiology Electronically Signed   By: Roanna Banning M.D.   On: 11/08/2023 18:10   VAS [place] LOWER EXTREMITY VENOUS (DVT) Result Date: 11/07/2023  Lower Venous DVT Study Patient Name:  AVITAL DANCY [facility]  Date of Exam:   11/07/2023 Medical Rec #: [id number]          Accession #:    [id number] Date of Birth: Aug 16, 1956          Patient Gender: F Patient Age:   27 years Exam Location:  [facility] Procedure:      VAS [place] LOWER EXTREMITY VENOUS (  DVT) Referring Phys: Tresa Endo OSBORNE --------------------------------------------------------------------------------  Indications: Pain, stroke, and A-fib, anemia.  Comparison Study: No prior exam. Performing Technologist: Fernande Bras  Examination Guidelines: A complete evaluation includes B-mode imaging, spectral Doppler, color Doppler, and power Doppler as needed of all accessible portions of each vessel. Bilateral testing is considered an integral part of a complete examination. Limited examinations for reoccurring indications may be performed as noted. The reflux portion of the exam is performed with the patient in reverse Trendelenburg.  +---------+---------------+---------+-----------+----------+--------------+ RIGHT    CompressibilityPhasicitySpontaneityPropertiesThrombus Aging +---------+---------------+---------+-----------+----------+--------------+ CFV      Full           Yes      Yes                                 +---------+---------------+---------+-----------+----------+--------------+ SFJ      Full           Yes      Yes                                 +---------+---------------+---------+-----------+----------+--------------+ FV Prox  Full                                                        +---------+---------------+---------+-----------+----------+--------------+ FV Mid   Full                                                         +---------+---------------+---------+-----------+----------+--------------+ FV DistalFull                                                        +---------+---------------+---------+-----------+----------+--------------+ PFV      Full                                                        +---------+---------------+---------+-----------+----------+--------------+ POP      Full           Yes      Yes                                 +---------+---------------+---------+-----------+----------+--------------+ PTV      Full                                                        +---------+---------------+---------+-----------+----------+--------------+ PERO     Full                                                        +---------+---------------+---------+-----------+----------+--------------+   +---------+---------------+---------+-----------+----------+--------------+  LEFT     CompressibilityPhasicitySpontaneityPropertiesThrombus Aging +---------+---------------+---------+-----------+----------+--------------+ CFV      Full           Yes      Yes                                 +---------+---------------+---------+-----------+----------+--------------+ SFJ      Full           Yes      Yes                                 +---------+---------------+---------+-----------+----------+--------------+ FV Prox  Full                                                        +---------+---------------+---------+-----------+----------+--------------+ FV Mid   Full                                                        +---------+---------------+---------+-----------+----------+--------------+ FV DistalFull                                                        +---------+---------------+---------+-----------+----------+--------------+ PFV      Full                                                         +---------+---------------+---------+-----------+----------+--------------+ POP      Full           Yes      Yes                                 +---------+---------------+---------+-----------+----------+--------------+ PTV      Full                                                        +---------+---------------+---------+-----------+----------+--------------+ PERO     Full                                                        +---------+---------------+---------+-----------+----------+--------------+     Summary: BILATERAL: - No evidence of deep vein thrombosis seen in the lower extremities, bilaterally. -No evidence of popliteal cyst, bilaterally.   *See table(s) above for measurements and observations. Electronically signed by Heath Lark on 11/07/2023 at 4:05:14 PM.    Final    CT ABDOMEN  PELVIS WO CONTRAST Result Date: 11/06/2023 CLINICAL DATA:  Diverticulitis, complication suspected EXAM: CT ABDOMEN AND PELVIS WITHOUT CONTRAST TECHNIQUE: Multidetector CT imaging of the abdomen and pelvis was performed following the standard protocol without IV contrast. RADIATION DOSE REDUCTION: This exam was performed according to the departmental dose-optimization program which includes automated exposure control, adjustment of the mA and/or kV according to patient size and/or use of iterative reconstruction technique. COMPARISON:  10/31/2023 FINDINGS: Lower chest: Volume loss and opacity in the lower lungs consistent with atelectasis. There is a central line with tip at the upper right atrium. Coronary atherosclerosis. Hepatobiliary: No focal liver abnormality. Calcific in the intermediate density in the gallbladder attributed to stones. No biliary dilatation or pericholecystic edema. Pancreas: Unremarkable. Spleen: Unremarkable. Adrenals/Urinary Tract: Negative adrenals. No hydronephrosis or stone. Renal cortical thinning and lobulation at the left upper pole. 3 mm stone at the upper pole left  kidney. The urinary bladder is collapsed around a Foley catheter. Stomach/Bowel: Descending colostomy. Bowel anastomosis in the right abdomen. No evidence of bowel obstruction. Fluid collection with loculation appearance/peritoneal thickening along the left flank measuring up to 5 x 6 x 10 cm. Milder fluid accumulation in the ventral peritoneal space. Vascular/Lymphatic: Extensive atheromatous calcification of the aorta and iliacs. No mass or adenopathy. Reproductive:No pathologic findings. Other: No ascites or pneumoperitoneum. Musculoskeletal: No acute abnormalities.  T9 butterfly vertebra IMPRESSION: 1. Interval colostomy with fluid collection along the left gutter, dominant pocket measuring 10 x 6 x 5 cm. 2. No bowel obstruction. 3. Cholelithiasis and left nephrolithiasis. Electronically Signed   By: Tiburcio Pea M.D.   On: 11/06/2023 07:12   DG Chest Port 1 View Result Date: 11/03/2023 CLINICAL DATA:  Shortness of breath EXAM: PORTABLE CHEST - 1 VIEW COMPARISON:  11/11/2018 FINDINGS: Unchanged mild cardiomegaly and pulmonary vascular congestion. Interval worsening of left basilar opacity likely due to atelectasis. Lungs otherwise clear. Examination is limited due to expiratory phase of imaging. Interval placement of temporary right IJ hemodialysis catheter which terminates in the region of the right atrium. Nasogastric tube terminates in the left upper quadrant. IMPRESSION: Interval worsening of left basilar opacity which is likely due to atelectasis. Electronically Signed   By: Acquanetta Belling M.D.   On: 11/03/2023 07:22   IR Fluoro Guide CV Line Right Result Date: 11/01/2023 INDICATION: ESRD requiring HD. EXAM: NON-TUNNELED CENTRAL VENOUS HEMODIALYSIS CATHETER PLACEMENT WITH ULTRASOUND AND FLUOROSCOPIC GUIDANCE COMPARISON:  CT AP 10/31/2023. MEDICATIONS: Local anesthetic was administered. FLUOROSCOPY TIME:  Fluoroscopic dose; 35 mGy COMPLICATIONS: None immediate. PROCEDURE: Informed written consent was  obtained from the patient and/or patient's representative after a discussion of the risks, benefits, and alternatives to treatment. Questions regarding the procedure were encouraged and answered. The RIGHT neck and chest were prepped with chlorhexidine in a sterile fashion, and a sterile drape was applied covering the operative field. Maximum barrier sterile technique with sterile gowns and gloves were used for the procedure. A timeout was performed prior to the initiation of the procedure. After the overlying soft tissues were anesthetized, a small venotomy incision was created and a micropuncture kit was utilized to access the external jugular vein. Real-time ultrasound guidance was utilized for vascular access including the acquisition of a permanent ultrasound image documenting patency of the accessed vessel. The microwire was utilized to measure appropriate catheter length. A stiff glidewire was advanced to the level of the IVC. Under fluoroscopic guidance, the venotomy was serially dilated, ultimately allowing placement of a 20 cm temporary Trialysis catheter with tip ultimately  terminating within the superior aspect of the right atrium. Final catheter positioning was confirmed and documented with a spot radiographic image. The catheter aspirates and flushes normally. The catheter was flushed with appropriate volume heparin dwells. The catheter exit site was secured with a 2-0 Ethilon retention suture. A dressing was placed. The patient tolerated the procedure well without immediate post procedural complication. IMPRESSION: Successful placement of a RIGHT EXTERNAL jugular approach 20 cm non-tunneled dialysis catheter The tip of the catheter is positioned within the proximal RIGHT atrium. The catheter is ready for immediate use. PLAN: This catheter may be converted to a tunneled dialysis catheter at a later date as indicated. Roanna Banning, MD [facility] Radiology  Electronically Signed   By: Roanna Banning M.D.   On: 11/01/2023 18:15   IR US Guide Vasc Access Right Result Date: 11/01/2023 INDICATION: ESRD requiring HD. EXAM: NON-TUNNELED CENTRAL VENOUS HEMODIALYSIS CATHETER PLACEMENT WITH ULTRASOUND AND FLUOROSCOPIC GUIDANCE COMPARISON:  CT AP 10/31/2023. MEDICATIONS: Local anesthetic was administered. FLUOROSCOPY TIME:  Fluoroscopic dose; 35 mGy COMPLICATIONS: None immediate. PROCEDURE: Informed written consent was obtained from the patient and/or patient's representative after a discussion of the risks, benefits, and alternatives to treatment. Questions regarding the procedure were encouraged and answered. The RIGHT neck and chest were prepped with chlorhexidine in a sterile fashion, and a sterile drape was applied covering the operative field. Maximum barrier sterile technique with sterile gowns and gloves were used for the procedure. A timeout was performed prior to the initiation of the procedure. After the overlying soft tissues were anesthetized, a small venotomy incision was created and a micropuncture kit was utilized to access the external jugular vein. Real-time ultrasound guidance was utilized for vascular access including the acquisition of a permanent ultrasound image documenting patency of the accessed vessel. The microwire was utilized to measure appropriate catheter length. A stiff glidewire was advanced to the level of the IVC. Under fluoroscopic guidance, the venotomy was serially dilated, ultimately allowing placement of a 20 cm temporary Trialysis catheter with tip ultimately terminating within the superior aspect of the right atrium. Final catheter positioning was confirmed and documented with a spot radiographic image. The catheter aspirates and flushes normally. The catheter was flushed with appropriate volume heparin dwells. The catheter exit site was secured with a 2-0 Ethilon retention suture. A dressing was placed. The patient tolerated the  procedure well without immediate post procedural complication. IMPRESSION: Successful placement of a RIGHT EXTERNAL jugular approach 20 cm non-tunneled dialysis catheter The tip of the catheter is positioned within the proximal RIGHT atrium. The catheter is ready for immediate use. PLAN: This catheter may be converted to a tunneled dialysis catheter at a later date as indicated. Roanna Banning, MD [facility] Radiology Electronically Signed   By: Roanna Banning M.D.   On: 11/01/2023 18:15   CT ABDOMEN PELVIS WO CONTRAST Result Date: 10/31/2023 CLINICAL DATA:  Diverticulitis, complication suspected. EXAM: CT ABDOMEN AND PELVIS WITHOUT CONTRAST TECHNIQUE: Multidetector CT imaging of the abdomen and pelvis was performed following the standard protocol without IV contrast. RADIATION DOSE REDUCTION: This exam was performed according to the departmental dose-optimization program which includes automated exposure control, adjustment of the mA and/or kV according to patient size and/or use of iterative reconstruction technique. COMPARISON:  10/27/2023. FINDINGS: Lower chest: Heart is enlarged and multi-vessel coronary artery calcifications are noted. Consolidation and patchy airspace disease is present at the lung bases bilaterally. Hepatobiliary: No focal liver abnormality is seen.  Stones are present within the gallbladder. No biliary ductal dilatation. Pancreas: Unremarkable. No pancreatic ductal dilatation or surrounding inflammatory changes. Spleen: Normal in size without focal abnormality. Adrenals/Urinary Tract: The adrenal glands are within normal limits. Calcifications are present in the upper pole of the left kidney. There is a cyst in the lower pole of the right kidney. No hydronephrosis bilaterally. The bladder is unremarkable. Stomach/Bowel: The stomach is within normal limits. No bowel obstruction or pneumatosis is seen. Right hemicolectomy changes are noted. There  is diffuse colonic wall thickening. Scattered diverticula are present along the colon. A few small foci of free air are noted in the mesentery in the mid left abdomen, slightly increased from the previous exam. There is suggestion of contrast extravasation in the region. There is fine no definite abscess is seen. Vascular/Lymphatic: Aortic atherosclerosis. No enlarged abdominal or pelvic lymph nodes. Reproductive: No acute abnormality. Other: Mild ascites is noted in all 4 quadrants. A fat containing umbilical hernia is noted. There is mild anasarca. Musculoskeletal: The bony structures are stable. IMPRESSION: 1. Worsening diverticulitis of the descending colon with perforation and increasing free air and suspected oral contrast extravasation in the mid left abdomen. No definite abscess is seen. Surgical consultation is recommended. 2. Mild ascites. 3. Diffuse colonic wall thickening which may be due to associated inflammatory changes. 4. Patchy airspace disease at the lung bases with bilateral lower lobe consolidation. 5. Cholelithiasis. 6. Aortic atherosclerosis. Critical Value/emergent results were called by telephone at the time of interpretation on 10/31/2023 at 11:59 pm to provider Dr. Antionette Char, who verbally acknowledged these results. Electronically Signed   By: Thornell Sartorius M.D.   On: 10/31/2023 23:59   CT ABDOMEN PELVIS WO CONTRAST Result Date: 10/27/2023 CLINICAL DATA:  Acute nonlocalized abdominal pain. Chills and fever. Vomiting and diarrhea. EXAM: CT ABDOMEN AND PELVIS WITHOUT CONTRAST TECHNIQUE: Multidetector CT imaging of the abdomen and pelvis was performed following the standard protocol without IV contrast. RADIATION DOSE REDUCTION: This exam was performed according to the departmental dose-optimization program which includes automated exposure control, adjustment of the mA and/or kV according to patient size and/or use of iterative reconstruction technique. COMPARISON:  CT abdomen and pelvis  04/19/2021 FINDINGS: Lower chest: Bibasilar atelectasis.  No acute abnormality. Hepatobiliary: Cholelithiasis without evidence of acute cholecystitis. No biliary dilation. Unremarkable noncontrast appearance of the liver. Pancreas: Unremarkable. Spleen: Unremarkable. Adrenals/Urinary Tract: Normal adrenal glands. No urinary calculi or hydronephrosis. Unremarkable bladder. Stomach/Bowel: Stomach is within normal limits. No bowel obstruction. Colonic diverticulosis. Wall thickening, inflammatory stranding, and free fluid about the descending colon. Loosely organized fluid and gas about the inflamed descending colon (circa series 3/image 49-50) compatible with contained perforation. The adjacent jejunum is inflamed. Postoperative change about the colon with ileocolonic anastomosis in the right upper quadrant. Vascular/Lymphatic: Aortic atherosclerosis. No enlarged abdominal or pelvic lymph nodes. Reproductive: No acute abnormality. Other: Fat containing periumbilical hernia. Musculoskeletal: No acute fracture. IMPRESSION: 1. Acute diverticulitis of the descending colon with contained perforation. 2. Inflamed jejunum adjacent to the perforated diverticulitis. 3.  Aortic Atherosclerosis (ICD10-I70.0). Critical Value/emergent results were called by telephone at the time of interpretation on 10/27/2023 at 9:22 pm to provider [facility] , who verbally acknowledged these results. Electronically Signed   By: Minerva Fester M.D.   On: 10/27/2023 21:24     TODAY-DAY OF DISCHARGE:  Subjective:   [name] today has no headache,no chest abdominal pain,no new weakness tingling or numbness, feels much better wants to go home today.   Objective:  Blood pressure 123/68, pulse 75, temperature 98.6 F (37 C), temperature source Oral, resp. rate 15, height 5' 7.01" (1.702 m), weight 86.8 kg, SpO2 98%. No intake or output data in the 24 hours ending 11/25/23 0844 Filed Weights   11/21/23 0452 11/22/23 0802  11/22/23 1320  Weight: 91 kg 85.6 kg 86.8 kg    Exam: Awake Alert, Oriented *3, No new F.N deficits, Normal affect [place].AT,[name],[name], No cervical lymphadenopathy appriciated.  Symmetrical Chest wall movement, Good air movement bilaterally, CTAB RRR,No Gallops,Rubs or new Murmurs, No Parasternal Heave +ve B.Sounds, Abd Soft, Non tender, No organomegaly appriciated, No rebound -guarding or rigidity. No Cyanosis, Clubbing or edema, No new Rash or bruise   PERTINENT RADIOLOGIC STUDIES: No results found.   PERTINENT LAB RESULTS: CBC: No results for input(s): "WBC", "HGB", "HCT", "PLT" in the last 72 hours.  CMET CMP     Component Value Date/Time   NA 130 (L) 11/22/2023 0835   NA 137 02/28/2021 1548   K 3.8 11/22/2023 0835   CL 96 (L) 11/22/2023 0835   CO2 25 11/22/2023 0835   GLUCOSE 162 (H) 11/22/2023 0835   BUN 24 (H) 11/22/2023 0835   BUN 33 (H) 02/28/2021 1548   CREATININE 7.31 (H) 11/22/2023 0835   CREATININE 3.38 (H) 12/26/2015 1347   CALCIUM 7.8 (L) 11/22/2023 0835   PROT 5.8 (L) 11/11/2023 0327   PROT 7.0 11/07/2017 1414   ALBUMIN 2.1 (L) 11/22/2023 0835   ALBUMIN 4.0 11/07/2017 1414   AST 23 11/11/2023 0327   ALT 17 11/11/2023 0327   ALKPHOS 39 11/11/2023 0327   BILITOT 0.5 11/11/2023 0327   BILITOT 0.2 11/07/2017 1414   GFR 17.34 (L) 04/12/2021 1501   EGFR 9 04/10/2023 1059   GFRNONAA 6 (L) 11/22/2023 0835   GFRNONAA 13 (L) 01/10/2015 1428    GFR Estimated Creatinine Clearance: 8.3 mL/min (A) (by C-G formula based on SCr of 7.31 mg/dL (H)). No results for input(s): "LIPASE", "AMYLASE" in the last 72 hours. No results for input(s): "CKTOTAL", "CKMB", "CKMBINDEX", "TROPONINI" in the last 72 hours. Invalid input(s): "POCBNP" No results for input(s): "DDIMER" in the last 72 hours. No results for input(s): "HGBA1C" in the last 72 hours. No results for input(s): "CHOL", "HDL", "LDLCALC", "TRIG", "CHOLHDL", "LDLDIRECT" in the last 72 hours. No  results for input(s): "TSH", "T4TOTAL", "T3FREE", "THYROIDAB" in the last 72 hours.  Invalid input(s): "FREET3" No results for input(s): "VITAMINB12", "FOLATE", "FERRITIN", "TIBC", "IRON", "RETICCTPCT" in the last 72 hours. Coags: No results for input(s): "INR" in the last 72 hours.  Invalid input(s): "PT" Microbiology: No results found for this or any previous visit (from the past 240 hours).  FURTHER DISCHARGE INSTRUCTIONS:  Get Medicines reviewed and adjusted: Please take all your medications with you for your next visit with your Primary MD  Laboratory/radiological data: Please request your Primary MD to go over all hospital tests and procedure/radiological results at the follow up, please ask your Primary MD to get all Hospital records sent to his/her office.  In some cases, they will be blood work, cultures and biopsy results pending at the time of your discharge. Please request that your primary care M.D. goes through all the records of your hospital data and follows up on these results.  Also Note the following: If you experience worsening of your admission symptoms, develop shortness of breath, life threatening emergency, suicidal or homicidal thoughts you must seek medical attention immediately by calling 911 or calling your MD immediately  if symptoms less severe.  You must read complete instructions/literature along with all the possible adverse reactions/side effects for all the Medicines you take and that have been prescribed to you. Take any new Medicines after you have completely understood and accpet all the possible adverse reactions/side effects.   Do not drive when taking Pain medications or sleeping medications (Benzodaizepines)  Do not take more than prescribed Pain, Sleep and Anxiety Medications. It is not advisable to combine anxiety,sleep and pain medications without talking with your primary care practitioner  Special Instructions: If you have smoked or chewed  Tobacco  in the last 2 yrs please stop smoking, stop any regular Alcohol  and or any Recreational drug use.  Wear Seat belts while driving.  Please note: You were cared for by a hospitalist during your hospital stay. Once you are discharged, your primary care physician will handle any further medical issues. Please note that NO REFILLS for any discharge medications will be authorized once you are discharged, as it is imperative that you return to your primary care physician (or establish a relationship with a primary care physician if you do not have one) for your post hospital discharge needs so that they can reassess your need for medications and monitor your lab values.  Total Time spent coordinating discharge including counseling, education and face to face time equals greater than 30 minutes.  SignedJeoffrey Massed 11/25/2023 8:44 AM

## 2023-11-25 NOTE — Progress Notes (Addendum)
 D/C order noted. Spoke to staff at [facility] to be advised that pt will d/c to [facility] today and pt should start on Wednesday. Arrangements added to pt's AVS and renal PA made aware of d/c as well (in case new orders need to be sent).   Olivia Canter Renal Navigator [phone]  Addendum at 2:41 pm: Met with pt at bedside to discuss out-pt HD arrangements since d/c plan confirmed for snf today. Schedule letter provided as well. Pt agreeable to plan.

## 2023-11-25 NOTE — TOC Transition Note (Signed)
 Transition of Care [facility]) - Discharge Note   Patient Details  Name: [name] MRN: [id number] Date of Birth: 01/10/1956  Transition of Care [facility]) CM/SW Contact:  Mearl Latin, LCSW Phone Number: 11/25/2023, 4:18 PM   Clinical Narrative:    Patient will DC to: [facility]  Anticipated DC date: 11/25/23 Family notified: Pt notified son Transport by: Sharin Mons   Per MD patient ready for DC to [facility]. RN to call report prior to discharge [phone] Rm# [phone]). RN, patient, patient's family, and facility notified of DC. Discharge Summary and FL2 sent to facility. DC packet on chart including signed DNR. Ambulance transport requested for patient.   CSW will sign off for now as social work intervention is no longer needed. Please consult [place] again if new needs arise.     Final next level of care: Skilled Nursing Facility Barriers to Discharge: Barriers Resolved   Patient Goals and CMS Choice Patient states their goals for this hospitalization and ongoing recovery are:: Rehab CMS Medicare.gov Compare Post Acute Care list provided to:: Patient Represenative (must comment) Choice offered to / list presented to : Adult Children      Discharge Placement   Existing PASRR number confirmed : 11/25/23          Patient chooses bed at: [facility] Patient to be transferred to facility by: [facility] Name of family member notified: Pt notified Patient and family notified of of transfer: 11/25/23  Discharge Plan and Services Additional resources added to the After Visit Summary for   In-house Referral: Clinical Social Work   Post Acute Care Choice: IP Rehab                               Social Drivers of Health (SDOH) Interventions SDOH Screenings   Food Insecurity: No Food Insecurity (10/28/2023)  Housing: Low Risk  (10/28/2023)  Transportation Needs: No Transportation Needs (10/28/2023)  Utilities: Not At Risk (10/28/2023)  Depression (PHQ2-9): Low Risk  (02/28/2021)   Financial Resource Strain: Low Risk  (08/25/2021)   Received from [facility], [facility]  Physical Activity: Insufficiently Active (06/20/2021)   Received from [facility], [facility]  Social Connections: Socially Isolated (10/28/2023)  Stress: No Stress Concern Present (08/25/2021)   Received from [facility], [facility]  Tobacco Use: Low Risk  (11/02/2023)     Readmission Risk Interventions     No data to display

## 2023-11-25 NOTE — Progress Notes (Signed)
 Patient return to unit after dialysis. 500 mls taken off. Report called to SNF, no answer.

## 2023-11-25 NOTE — Progress Notes (Addendum)
 Pt system clotted at last 10 mins.Tylenol 650 mg po given during HD.Glucose checked read :82 after HD.  11/25/23 1329  Vitals  Temp 98.2 F (36.8 C)  BP Location Right Arm  BP Method Automatic  Patient Position (if appropriate) Lying  Oxygen Therapy  O2 Device Room Air  During Treatment Monitoring  Intra-Hemodialysis Comments Tx completed  Post Treatment  Dialyzer Clearance Clotted  Hemodialysis Intake (mL) 0 mL  Liters Processed 66  Fluid Removed (mL) 500 mL  Tolerated HD Treatment Yes  Post-Hemodialysis Comments Pt goal met.  Hemodialysis Catheter Right Internal jugular Double lumen Permanent (Tunneled)  Placement Date/Time: 11/12/23 0826   Placed prior to admission: No  Serial / Lot #: [id number]  Expiration Date: 06/20/28  Time Out: Correct patient;Correct site;Correct procedure  Maximum sterile barrier precautions: Hand hygiene;Large sterile sheet;...  Site Condition No complications  Blue Lumen Status Heparin locked  Red Lumen Status Heparin locked  Catheter fill solution Heparin 1000 units/ml  Catheter fill volume (Arterial) 1.6 cc  Catheter fill volume (Venous) 1.6  Dressing Type Gauze/Drain sponge;Transparent  Dressing Status Antimicrobial disc/dressing in place;Clean, Dry, Intact  Interventions New dressing  Drainage Description None  Dressing Change Due 11/27/23  Post treatment catheter status Capped and Clamped

## 2023-11-25 NOTE — Progress Notes (Signed)
 Patient still out at dialysis. Should return within the hours and be discharge for SNF post dialysis.

## 2023-11-25 NOTE — Progress Notes (Signed)
 OT Cancellation Note  Patient Details Name: [name] MRN: [id number] DOB: 01/07/1956   Cancelled Treatment:    Reason Eval/Treat Not Completed: Medical issues which prohibited therapy (HD with plans to discharge to SNF after HD today. OT to f/u if pt is staying acutely.)  Donia Pounds 11/25/2023, 11:12 AM

## 2023-11-25 NOTE — Progress Notes (Signed)
 23 Days Post-Op   Subjective/Chief Complaint: tired   Objective: Vital signs in last 24 hours: Temp:  [98 F (36.7 C)-98.6 F (37 C)] 98.6 F (37 C) (03/03 0830) Pulse Rate:  [59-78] 75 (03/03 0830) Resp:  [10-24] 15 (03/03 0830) BP: (105-123)/(48-84) 123/68 (03/03 0830) SpO2:  [93 %-100 %] 98 % (03/03 0830) Last BM Date : 11/22/23  Intake/Output from previous day: No intake/output data recorded. Intake/Output this shift: No intake/output data recorded.  Ab soft approp tender wound with some purulent drainage which has been the case for some time apparently, stoma viable and functional I probed wound and there is small hole- I cannot get any more out today    Lab Results:  No results for input(s): "WBC", "HGB", "HCT", "PLT" in the last 72 hours. BMET No results for input(s): "NA", "K", "CL", "CO2", "GLUCOSE", "BUN", "CREATININE", "CALCIUM" in the last 72 hours. PT/INR No results for input(s): "LABPROT", "INR" in the last 72 hours. ABG No results for input(s): "PHART", "HCO3" in the last 72 hours.  Invalid input(s): "PCO2", "PO2"  Studies/Results: No results found.  Anti-infectives: Anti-infectives (From admission, onward)    Start     Dose/Rate Route Frequency Ordered Stop   11/08/23 1200  erythromycin (EES) 400 MG/5ML suspension 400 mg  Status:  Discontinued        400 mg Oral Every 6 hours 11/08/23 0815 11/12/23 1111   11/06/23 0815  piperacillin-tazobactam (ZOSYN) IVPB 2.25 g  Status:  Discontinued        2.25 g 100 mL/hr over 30 Minutes Intravenous Every 8 hours 11/06/23 0729 11/14/23 0951   11/01/23 1415  piperacillin-tazobactam (ZOSYN) IVPB 2.25 g        2.25 g 100 mL/hr over 30 Minutes Intravenous Every 8 hours 11/01/23 1316 11/06/23 0602   10/29/23 1100  piperacillin-tazobactam (ZOSYN) IVPB 2.25 g  Status:  Discontinued        2.25 g 100 mL/hr over 30 Minutes Intravenous Every 8 hours 10/29/23 1006 11/01/23 1016   10/28/23 0600  piperacillin-tazobactam  (ZOSYN) IVPB 2.25 g  Status:  Discontinued        2.25 g 100 mL/hr over 30 Minutes Intravenous Every 8 hours 10/27/23 2224 10/27/23 2235   10/28/23 0600  ceFEPIme (MAXIPIME) 1 g in sodium chloride 0.9 % 100 mL IVPB  Status:  Discontinued        1 g 200 mL/hr over 30 Minutes Intravenous Every 24 hours 10/27/23 2235 10/29/23 0954   10/28/23 0600  metroNIDAZOLE (FLAGYL) IVPB 500 mg  Status:  Discontinued        500 mg 100 mL/hr over 60 Minutes Intravenous Every 12 hours 10/27/23 2235 10/29/23 0954   10/27/23 2130  piperacillin-tazobactam (ZOSYN) IVPB 3.375 g        3.375 g 100 mL/hr over 30 Minutes Intravenous  Once 10/27/23 2128 10/27/23 2320       Assessment/Plan: POD 20 s/p Hartmann's by Dr. Freida Busman 11/02/23 forSigmoid diverticulitis with perforation  - VAC in place with no evidence of bleeding. Changing M/Th - therapies for mobilization, rec CIR - WOC for colostomy care - increased miralax to BID 2/26. Continue for now -I think she is ok for dc with follow up with Dr Freida Busman   FEN: renal diet, protein supplements VTE: Eliquis ID: cefepime/flagyl 2/3>2/4; Zosyn 2/4>2/20   - per TRH -  PAF  HTN HLD ESRD - on HD CAD T2DM  Emelia Loron 11/25/2023

## 2023-11-25 NOTE — Progress Notes (Addendum)
 Patient had a fair shift. Dressing changed by the wound care nurse to midline incision and ostomy. Patient had dialysis which was uneventful. Medicated as per order. Discharge to [facility]. Report to Nurse on duty goes unanswered multiple times up to time of discharged. Message given to Receptionist Debra for the Nurse. IVA removed per Charge Nurse.

## 2023-11-25 NOTE — Consult Note (Addendum)
 WOC Nurse wound follow up Dr Ebbie assisted the dressing change. Wound type: midline surgical dehiscence Measurement: 16 cm x 4.5 cm x 1.5 cm  Wound bed: 100% red Drainage (amount, consistency, odor)  milky light brown appearing effluent in canister 100 ml today at 0900. Periwound: intact, has a crease at 3 o'clock between wound and the ostomy, located on the left.  Dressing procedure/placement/frequency: Removed old NPWT dressing. The old dressing was leaking, find a clog on the canister connection. The ostomy bag started to leak too on the wound side. Pt was complaining about the smelling. Cleansed wound with normal saline Barrier ring placed 1/2 at 3 o'clock and 1/2 at 9 o'clock crease.  Filled wound with 1 pieces black foam   Sealed NPWT dressing at HG Patient did not received IV pain medication per bedside nurse prior to dressing change Patient tolerated procedure well   WOC nurse will continue to provide NPWT dressing changes due to the complexity of the dressing change. Next change THURS. Pt will be discharged today to a SNF.   WOC Nurse ostomy follow up Stoma type/location: LMQ colostomy Stomal assessment/size: oval 25x54mm pink moist, slightly above skin level.  Peristomal assessment: intact with midline incision with NPWT to right as above.  Treatment options for stomal/peristomal skin: 2 barrier ring  Output approximately soft brown effluent in pouch. Leaking in the fold. Ostomy pouching: 1pc. Convex Gerlean #[id number], and barrier ring [phone] Education provided: she received the one and 2 piece pouch to try which one will be better doing a routine life.   Pt is legally blind, and has difficulty to cut. She told me that her son can do this for her.    WOC team will continue to follow for NPWT and education and support of ostomy.  Next visit on THURS.   Obs: ordered 2 piece pouch for home. Enrolled on Secure Start: YES   Thank-you,  Lela Holm BSN, RN, [employer], WOC  (Pager: [phone])

## 2023-11-25 NOTE — Consult Note (Signed)
 WOC team received consult for leaking NPWT.  Patient on WOC team schedule for NPWT change today (changed Mon and Thursday).  Secure chat sent to primary nurse regarding above.   Thank you,    Priscella Mann MSN, RN-BC, [employer] [phone]

## 2023-11-25 NOTE — TOC Progression Note (Addendum)
 Transition of Care [facility]) - Progression Note    Patient Details  Name: [name] MRN: [id number] Date of Birth: [birth year]  Transition of Care [facility]) CM/SW Contact  Mearl Latin, LCSW Phone Number: 11/25/2023, 8:54 AM  Clinical Narrative:    8:54am-CSW updated [facility] on discharge. They will let CSW know if they can still obtain the wound vac for today. Patient requires dialysis here today.   10:53 AM-[facility] has receive wound vac. Will send patient after Dialysis.   Expected Discharge Plan: Skilled Nursing Facility Barriers to Discharge: Other (must enter comment) (SNF awaiting wound vac)  Expected Discharge Plan and Services In-house Referral: Clinical Social Work   Post Acute Care Choice: IP Rehab Living arrangements for the past 2 months: Single Family Home Expected Discharge Date: 11/25/23                                     Social Determinants of Health (SDOH) Interventions SDOH Screenings   Food Insecurity: No Food Insecurity (10/28/2023)  Housing: Low Risk  (10/28/2023)  Transportation Needs: No Transportation Needs (10/28/2023)  Utilities: Not At Risk (10/28/2023)  Depression (PHQ2-9): Low Risk  (02/28/2021)  Financial Resource Strain: Low Risk  (08/25/2021)   Received from [facility], [facility]  Physical Activity: Insufficiently Active (06/20/2021)   Received from [facility], [facility]  Social Connections: Socially Isolated (10/28/2023)  Stress: No Stress Concern Present (08/25/2021)   Received from [facility], [facility]  Tobacco Use: Low Risk  (11/02/2023)    Readmission Risk Interventions     No data to display

## 2023-11-25 NOTE — Plan of Care (Signed)
 Patient had a fair shift. Dressing changed by the wound care nurse to midline incision and ostomy. Patient had dialysis which was uneventful. Medicated as per order. Discharge to [facility]. Report to Nurse on duty goes unanswered multiple times up to time of discharged. Message given to Receptionist Debra for the Nurse.  Problem: Education: Goal: Ability to describe self-care measures that may prevent or decrease complications (Diabetes Survival Skills Education) will improve Outcome: Progressing Goal: Individualized Educational Video(s) Outcome: Progressing   Problem: Coping: Goal: Ability to adjust to condition or change in health will improve Outcome: Progressing   Problem: Fluid Volume: Goal: Ability to maintain a balanced intake and output will improve Outcome: Progressing   Problem: Health Behavior/Discharge Planning: Goal: Ability to identify and utilize available resources and services will improve Outcome: Progressing Goal: Ability to manage health-related needs will improve Outcome: Progressing   Problem: Metabolic: Goal: Ability to maintain appropriate glucose levels will improve Outcome: Progressing   Problem: Nutritional: Goal: Maintenance of adequate nutrition will improve Outcome: Progressing Goal: Progress toward achieving an optimal weight will improve Outcome: Progressing   Problem: Skin Integrity: Goal: Risk for impaired skin integrity will decrease Outcome: Progressing   Problem: Tissue Perfusion: Goal: Adequacy of tissue perfusion will improve Outcome: Progressing   Problem: Education: Goal: Knowledge of General Education information will improve Description: Including pain rating scale, medication(s)/side effects and non-pharmacologic comfort measures Outcome: Progressing   Problem: Health Behavior/Discharge Planning: Goal: Ability to manage health-related needs will improve Outcome: Progressing   Problem: Clinical Measurements: Goal:  Ability to maintain clinical measurements within normal limits will improve Outcome: Progressing Goal: Will remain free from infection Outcome: Progressing Goal: Diagnostic test results will improve Outcome: Progressing Goal: Respiratory complications will improve Outcome: Progressing Goal: Cardiovascular complication will be avoided Outcome: Progressing   Problem: Activity: Goal: Risk for activity intolerance will decrease Outcome: Progressing   Problem: Nutrition: Goal: Adequate nutrition will be maintained Outcome: Progressing   Problem: Coping: Goal: Level of anxiety will decrease Outcome: Progressing   Problem: Elimination: Goal: Will not experience complications related to bowel motility Outcome: Progressing Goal: Will not experience complications related to urinary retention Outcome: Progressing   Problem: Pain Managment: Goal: General experience of comfort will improve and/or be controlled Outcome: Progressing   Problem: Safety: Goal: Ability to remain free from injury will improve Outcome: Progressing   Problem: Skin Integrity: Goal: Risk for impaired skin integrity will decrease Outcome: Progressing   Problem: Coping: Goal: Ability to identify and develop effective coping behavior will improve Outcome: Progressing   Problem: Clinical Measurements: Goal: Quality of life will improve Outcome: Progressing   Problem: Respiratory: Goal: Verbalizations of increased ease of respirations will increase Outcome: Progressing   Problem: Pain Management: Goal: Satisfaction with pain management regimen will improve Outcome: Progressing

## 2023-11-25 NOTE — Progress Notes (Signed)
 PT Cancellation Note  Patient Details Name: MERITA HAWKS MRN: [id number] DOB: [birth year]   Cancelled Treatment:    Reason Eval/Treat Not Completed: Patient at procedure or test/unavailable  Patient is off unit for HD. Will follow up as schedule permits this afternoon.   Kathlyn Sacramento, PT, DPT [facility]  Rehabilitation Services Physical Therapist Office: [phone] Website: [email or web address]   Berton Mount 11/25/2023, 11:10 AM

## 2023-11-25 NOTE — Procedures (Signed)
 I was present at this dialysis session. I have reviewed the session itself and made appropriate changes.   3K bath. UF goal of 0L at first when started with soft BPs. BPs have improved and will try for 0.5 to 1L today.  TDC.    Awaiting SNF.   Filed Weights   11/22/23 0802 11/22/23 1320 11/25/23 1002  Weight: 85.6 kg 86.8 kg 87 kg    Recent Labs  Lab 11/22/23 0835  NA 130*  K 3.8  CL 96*  CO2 25  GLUCOSE 162*  BUN 24*  CREATININE 7.31*  CALCIUM 7.8*  PHOS 4.8*    Recent Labs  Lab 11/19/23 0834 11/20/23 0820 11/22/23 0835  WBC 10.2 10.3 8.9  HGB 10.1* 9.3* 9.1*  HCT 31.1* 29.8* 28.7*  MCV 84.1 85.6 85.2  PLT 227 232 191    Scheduled Meds:  amLODipine  5 mg Oral Daily   apixaban  5 mg Oral BID   brimonidine  1 drop Both Eyes BID   carvedilol  6.25 mg Oral BID WC   Chlorhexidine Gluconate Cloth  6 each Topical Q0600   darbepoetin (ARANESP) injection - DIALYSIS  100 mcg Subcutaneous Q Mon-1800   docusate sodium  100 mg Oral BID   insulin aspart  0-15 Units Subcutaneous TID WC   insulin aspart  0-5 Units Subcutaneous QHS   insulin glargine  12 Units Subcutaneous Q24H   latanoprost  1 drop Both Eyes QHS   mouth rinse  15 mL Mouth Rinse 4 times per day   pantoprazole  40 mg Oral BID   polyethylene glycol  17 g Oral BID   senna  1 tablet Oral Daily   sodium chloride flush  10-40 mL Intracatheter Q12H   Continuous Infusions: PRN Meds:.acetaminophen **OR** acetaminophen, albuterol, hydrALAZINE, HYDROmorphone (DILAUDID) injection, melatonin, menthol-cetylpyridinium, ondansetron (ZOFRAN) IV, mouth rinse, oxyCODONE   Sabra Heck  MD 11/25/2023, 12:49 PM

## 2023-11-25 NOTE — Progress Notes (Signed)
 Patient left to get dialysis done.

## 2023-12-17 ENCOUNTER — Ambulatory Visit (HOSPITAL_COMMUNITY)
Admission: RE | Admit: 2023-12-17 | Discharge: 2023-12-17 | Disposition: A | Discharge status: Home / Self Care | Source: Ambulatory Visit | Attending: Nurse Practitioner | Admitting: Nurse Practitioner

## 2023-12-17 DIAGNOSIS — Z433 Encounter for attention to colostomy: Secondary | ICD-10-CM

## 2023-12-17 DIAGNOSIS — L24B3 Irritant contact dermatitis related to fecal or urinary stoma or fistula: Secondary | ICD-10-CM | POA: Diagnosis present

## 2023-12-17 NOTE — Discharge Instructions (Addendum)
 COnvex barrier ITEM # [id number] Filtered pouch  ITEM # [id number] Barrier ring ITEM # [id number] Powder  [id number] Skin prep  [id number]  REmove old pouch and clean with mild soap Apply stoma powder Seal in powder with skin prep Apply barrier ring to creasing at 9:00 and around stoma Using convex 2 piece pouch Add ostomy belt

## 2023-12-17 NOTE — Progress Notes (Signed)
 [facility]   Reason for visit:  LMQ colostomy  Discharged home from [facility].  States pouch leaked in rehab and is leaking now at home.  HAs HH in place  Would like recommendations for reliable pouching.  Prefers 2 piece pouch.  HPI:  Diverticulitis with perforation and colostomy Past Medical History:  Diagnosis Date  . Anemia   . Arthritis   . Atrial fibrillation (HCC)   . CAD (coronary artery disease)   . Chronic kidney disease, stage III (moderate) (HCC)   . Constipation   . Diabetes mellitus (HCC)   . History of blood transfusion   . HLD (hyperlipidemia)   . Hypertension   . Myocardial infarction [facility])    in April 2014   Family History  Problem Relation Age of Onset  . Diabetes Mother        No history CAD  . Colon cancer Mother 31       died at 100 from CRC  . Hypertension Father        Also had CAD  . Heart disease Father   . Cervical cancer Sister   . Diabetes Sister   . Congestive Heart Failure Sister   . Diabetes Brother   . Kidney disease Brother   . Heart disease Maternal Grandmother   . Sickle cell anemia Son   . Thyroid disease Son    Allergies  Allergen Reactions  . Nsaids Other (See Comments)    CKD stage 4  . Lisinopril Other (See Comments)    coughing  . Peanut-Containing Drug Products Itching and Other (See Comments)    GI intolerance - diarrhea   Current Outpatient Medications  Medication Sig Dispense Refill Last Dose/Taking  . ACCU-CHEK AVIVA PLUS test strip CHECK BLOOD GLUCOSE THREE TIMES DAILY 300 strip 3   . albuterol (PROVENTIL) (2.5 MG/3ML) 0.083% nebulizer solution Take 3 mLs (2.5 mg total) by nebulization every 2 (two) hours as needed for wheezing.     Marland Kitchen amLODipine (NORVASC) 5 MG tablet Take 5 mg by mouth daily.     Marland Kitchen apixaban (ELIQUIS) 2.5 MG TABS tablet Take 1 tablet (2.5 mg total) by mouth 2 (two) times daily. (Patient taking differently: Take 1.25 mg by mouth 2 (two) times daily.) 180 tablet 0   . Ascorbic Acid  (VITAMIN C PO) Take 1 tablet by mouth daily.     . Blood Glucose Monitoring Suppl (ACCU-CHEK AVIVA PLUS) w/Device KIT CHECK BLOOD GLUCOSE THREE TIMES DAILY 1 kit 0   . brimonidine (ALPHAGAN) 0.2 % ophthalmic solution Place 1 drop into both eyes 2 (two) times daily.     . calcitRIOL (ROCALTROL) 0.25 MCG capsule TAKE 1 CAPSULE (0.25 MCG TOTAL) EVERY MONDAY, WEDNESDAY, AND FRIDAY (Patient taking differently: Take 0.25 mcg by mouth in the morning and at bedtime.) 39 capsule 0   . carvedilol (COREG) 12.5 MG tablet TAKE 1 TABLET (12.5 MG TOTAL) BY MOUTH 2 (TWO) TIMES DAILY WITH A MEAL. 180 tablet 3   . DROPLET INSULIN SYRINGE 31G X 5/16" 0.5 ML MISC USE FOUR TIMES DAILY FOR INJECTIONS 400 each 3   . insulin aspart (NOVOLOG FLEXPEN) 100 UNIT/ML FlexPen 0-9 Units, Subcutaneous, 3 times daily with meals CBG < 70: Implement Hypoglycemia measures CBG 70 - 120: 0 units CBG 121 - 150: 1 unit CBG 151 - 200: 2 units CBG 201 - 250: 3 units CBG 251 - 300: 5 units CBG 301 - 350: 7 units CBG 351 - 400: 9 units  CBG > 400: call MD     . insulin glargine (LANTUS) 100 UNIT/ML injection Inject 0.12 mLs (12 Units total) into the skin daily. Please schedule appointment before next refill.     . Insulin Syringes, Disposable, U-100 0.5 ML MISC 4x daily injections 100 each 8   . Lancet Devices (ACCU-CHEK SOFTCLIX) lancets Fill for 1 month for TID testing. Use as instructed 90 each 0   . latanoprost (XALATAN) 0.005 % ophthalmic solution Place 1 drop into both eyes at bedtime.     . melatonin 5 MG TABS Take 10 mg by mouth at bedtime as needed (for insomnia).     . Multiple Vitamin (MULTIVITAMIN WITH MINERALS) TABS Take 1 tablet by mouth daily.     Marland Kitchen NEEDLE, DISP, 30 G (BD DISP NEEDLES) 30G X 1/2" MISC For 4x daily injections 100 each 13   . pantoprazole (PROTONIX) 40 MG tablet Take 1 tablet (40 mg total) by mouth daily.     . polyethylene glycol (MIRALAX / GLYCOLAX) 17 g packet Take 17 g by mouth daily.     Marland Kitchen REPATHA SURECLICK 140  MG/ML SOAJ Inject 140 mg into the skin every 14 (fourteen) days. (Patient not taking: Reported on 10/27/2023)     . senna (SENOKOT) 8.6 MG TABS tablet Take 1 tablet (8.6 mg total) by mouth daily.     Marland Kitchen VITAMIN D, CHOLECALCIFEROL, PO Take 1 tablet by mouth daily.      No current facility-administered medications for this encounter.   ROS  Review of Systems  Constitutional:  Positive for fatigue.  Gastrointestinal:  Positive for abdominal pain (surgical wound,).  Musculoskeletal:  Positive for gait problem.  Skin:  Positive for color change, rash and wound.  Psychiatric/Behavioral: Negative.    All other systems reviewed and are negative. Vital signs:  BP (!) 156/69 (BP Location: Left Arm)   Pulse 77   Temp 97.9 F (36.6 C) (Oral)   Resp 18   SpO2 97%  Exam:  Physical Exam Vitals reviewed.  Constitutional:      Appearance: She is obese.  Cardiovascular:     Rate and Rhythm: Normal rate and regular rhythm.  Pulmonary:     Effort: Pulmonary effort is normal.     Breath sounds: Normal breath sounds.  Abdominal:     Palpations: Abdomen is soft.     Comments: Midline incison LMQ colostomy  Skin:    General: Skin is warm and dry.     Findings: Erythema present.  Neurological:     Mental Status: She is alert and oriented to person, place, and time. Mental status is at baseline.  Psychiatric:        Mood and Affect: Mood normal.        Behavior: Behavior normal.        Thought Content: Thought content normal.    Stoma type/location:  LMQ colostomy  creasing at 9:00, points in towards umbilicus and midline incision.  Causes leaks.  Stomal assessment/size:  pink and moist  rounded, flush Peristomal assessment:  creasing, close proximity to midline incision Treatment options for stomal/peristomal skin: barrie ring to crease and around stoma for secure fit.  Needs convex barrier, 2 piece preferred by patient, filter and adding ostomy belt.  Output: soft brown stool Ostomy pouching:  2pc. Filtered  stoma powder and skin prep to peristomal irritation  barrier ring to crease and around stoma to promote seal.  We add a belt for added security.   Education provided:  pouch change performed.  Patient and family are pleased and optimistic that this system will be improved.  I inform them that they can be set up with DME [facility] ) once [facility] has discharged. For now, [facility] needs to order supplies. I provide item numbers and detailed instructions for [facility] nurse to follow and instruct patient and family further.  Will see back in clinic      Impression/dx  Colostomy Contact dermatitis  Discussion  Needs convexity, barrier ring to creasing and ostomy belt. [facility] supplies today, pouch change performed today.   Plan  See back 2 weeks.     Visit time: 55 minutes.   Mike Gip FNP-BC

## 2023-12-27 DIAGNOSIS — K9400 Colostomy complication, unspecified: Secondary | ICD-10-CM | POA: Insufficient documentation

## 2023-12-27 DIAGNOSIS — L24B3 Irritant contact dermatitis related to fecal or urinary stoma or fistula: Secondary | ICD-10-CM | POA: Insufficient documentation

## 2024-01-07 ENCOUNTER — Ambulatory Visit (HOSPITAL_COMMUNITY): Admitting: Nurse Practitioner

## 2024-01-14 ENCOUNTER — Ambulatory Visit (HOSPITAL_COMMUNITY): Admitting: Nurse Practitioner

## 2024-02-01 ENCOUNTER — Emergency Department (HOSPITAL_COMMUNITY)
Admission: EM | Admit: 2024-02-01 | Discharge: 2024-02-01 | Disposition: A | Discharge status: Home / Self Care | Attending: Emergency Medicine | Admitting: Emergency Medicine

## 2024-02-01 ENCOUNTER — Emergency Department (HOSPITAL_COMMUNITY)

## 2024-02-01 ENCOUNTER — Other Ambulatory Visit: Payer: Self-pay

## 2024-02-01 ENCOUNTER — Encounter (HOSPITAL_COMMUNITY): Payer: Self-pay | Admitting: Pharmacy Technician

## 2024-02-01 DIAGNOSIS — Z992 Dependence on renal dialysis: Secondary | ICD-10-CM | POA: Insufficient documentation

## 2024-02-01 DIAGNOSIS — Z79899 Other long term (current) drug therapy: Secondary | ICD-10-CM | POA: Diagnosis not present

## 2024-02-01 DIAGNOSIS — W228XXA Striking against or struck by other objects, initial encounter: Secondary | ICD-10-CM | POA: Diagnosis not present

## 2024-02-01 DIAGNOSIS — N186 End stage renal disease: Secondary | ICD-10-CM | POA: Insufficient documentation

## 2024-02-01 DIAGNOSIS — W19XXXA Unspecified fall, initial encounter: Secondary | ICD-10-CM

## 2024-02-01 DIAGNOSIS — E041 Nontoxic single thyroid nodule: Secondary | ICD-10-CM | POA: Insufficient documentation

## 2024-02-01 DIAGNOSIS — Z7901 Long term (current) use of anticoagulants: Secondary | ICD-10-CM | POA: Insufficient documentation

## 2024-02-01 DIAGNOSIS — I120 Hypertensive chronic kidney disease with stage 5 chronic kidney disease or end stage renal disease: Secondary | ICD-10-CM | POA: Diagnosis not present

## 2024-02-01 DIAGNOSIS — R600 Localized edema: Secondary | ICD-10-CM | POA: Insufficient documentation

## 2024-02-01 DIAGNOSIS — R42 Dizziness and giddiness: Secondary | ICD-10-CM

## 2024-02-01 DIAGNOSIS — Z7984 Long term (current) use of oral hypoglycemic drugs: Secondary | ICD-10-CM | POA: Diagnosis not present

## 2024-02-01 DIAGNOSIS — Z91010 Allergy to peanuts: Secondary | ICD-10-CM | POA: Insufficient documentation

## 2024-02-01 DIAGNOSIS — Z933 Colostomy status: Secondary | ICD-10-CM | POA: Diagnosis not present

## 2024-02-01 DIAGNOSIS — S0990XA Unspecified injury of head, initial encounter: Secondary | ICD-10-CM | POA: Insufficient documentation

## 2024-02-01 DIAGNOSIS — E162 Hypoglycemia, unspecified: Secondary | ICD-10-CM | POA: Insufficient documentation

## 2024-02-01 LAB — CBC WITH DIFFERENTIAL/PLATELET
Abs Immature Granulocytes: 0.2 10*3/uL — ABNORMAL HIGH (ref 0.00–0.07)
Basophils Absolute: 0.2 10*3/uL — ABNORMAL HIGH (ref 0.0–0.1)
Basophils Relative: 2 %
Eosinophils Absolute: 0.8 10*3/uL — ABNORMAL HIGH (ref 0.0–0.5)
Eosinophils Relative: 6 %
HCT: 39.4 % (ref 36.0–46.0)
Hemoglobin: 12.1 g/dL (ref 12.0–15.0)
Immature Granulocytes: 2 %
Lymphocytes Relative: 14 %
Lymphs Abs: 1.6 10*3/uL (ref 0.7–4.0)
MCH: 27.4 pg (ref 26.0–34.0)
MCHC: 30.7 g/dL (ref 30.0–36.0)
MCV: 89.1 fL (ref 80.0–100.0)
Monocytes Absolute: 1.2 10*3/uL — ABNORMAL HIGH (ref 0.1–1.0)
Monocytes Relative: 10 %
Neutro Abs: 8.1 10*3/uL — ABNORMAL HIGH (ref 1.7–7.7)
Neutrophils Relative %: 66 %
Platelets: 182 10*3/uL (ref 150–400)
RBC: 4.42 MIL/uL (ref 3.87–5.11)
RDW: 18.6 % — ABNORMAL HIGH (ref 11.5–15.5)
WBC: 12.1 10*3/uL — ABNORMAL HIGH (ref 4.0–10.5)
nRBC: 0.3 % — ABNORMAL HIGH (ref 0.0–0.2)

## 2024-02-01 LAB — BASIC METABOLIC PANEL WITH GFR
Anion gap: 11 (ref 5–15)
BUN: 6 mg/dL — ABNORMAL LOW (ref 8–23)
CO2: 28 mmol/L (ref 22–32)
Calcium: 9.4 mg/dL (ref 8.9–10.3)
Chloride: 97 mmol/L — ABNORMAL LOW (ref 98–111)
Creatinine, Ser: 4.95 mg/dL — ABNORMAL HIGH (ref 0.44–1.00)
GFR, Estimated: 9 mL/min — ABNORMAL LOW (ref >60)
Glucose, Bld: 48 mg/dL — ABNORMAL LOW (ref 70–99)
Potassium: 2.9 mmol/L — ABNORMAL LOW (ref 3.5–5.1)
Sodium: 136 mmol/L (ref 135–145)

## 2024-02-01 LAB — CBG MONITORING, ED
Glucose-Capillary: 68 mg/dL — ABNORMAL LOW (ref 70–99)
Glucose-Capillary: 169 mg/dL — ABNORMAL HIGH (ref 70–99)

## 2024-02-01 MED ORDER — INSULIN GLARGINE 100 UNIT/ML ~~LOC~~ SOLN: 7.0000 [IU] | Freq: Every day | SUBCUTANEOUS | Status: DC

## 2024-02-01 MED ORDER — ONDANSETRON HCL 4 MG/2ML IJ SOLN
4.0000 mg | Freq: Once | INTRAMUSCULAR | Status: AC
  Administered 2024-02-01: 4 mg via INTRAVENOUS
  Filled 2024-02-01: qty 2

## 2024-02-01 MED ORDER — POTASSIUM CHLORIDE CRYS ER 20 MEQ PO TBCR
20.0000 meq | EXTENDED_RELEASE_TABLET | Freq: Once | ORAL | Status: AC
Start: 2024-02-01 — End: 2024-02-01
  Administered 2024-02-01: 20 meq via ORAL
  Filled 2024-02-01: qty 1

## 2024-02-01 MED ORDER — DEXTROSE 50 % IV SOLN
1.0000 | Freq: Once | INTRAVENOUS | Status: AC
  Administered 2024-02-01: 50 mL via INTRAVENOUS
  Filled 2024-02-01: qty 50

## 2024-02-01 MED ORDER — AMLODIPINE BESYLATE 5 MG PO TABS: 2.5000 mg | ORAL_TABLET | Freq: Every day | ORAL | 2 refills | Status: DC

## 2024-02-01 MED ORDER — SODIUM CHLORIDE 0.9 % IV BOLUS
500.0000 mL | Freq: Once | INTRAVENOUS | Status: AC
  Administered 2024-02-01: 500 mL via INTRAVENOUS

## 2024-02-01 NOTE — ED Provider Notes (Incomplete)
 [place] [facility] Provider Note   CSN: [id number] Arrival date & time: 02/01/24  1627     History {Add pertinent medical, surgical, social history, OB history to HPI:1} Chief Complaint  Patient presents with   Fall   Dizziness    [name] is a 68 y.o. female.  68 year old female with a history of ESRD on IHD, atrial fibrillation on Eliquis , hypertension, and perforated diverticulitis status post colostomy in February who presents emergency department with lightheadedness.  Patient reports that since Thursday she has been feeling intermittently lightheaded.  Says that she has blood pressures that fluctuate wildly.  So that her blood pressure was 88 systolic on Thursday and she attempted to stand up felt lightheaded and then passed out.  Did strike her head on the way down.  No prolonged downtime and her son was with her.  Says that since then she has been feeling intermittently lightheaded especially with standing.  Says it is worse with her amlodipine .       Home Medications Prior to Admission medications   Medication Sig Start Date End Date Taking? Authorizing Provider  ACCU-CHEK AVIVA PLUS test strip CHECK BLOOD GLUCOSE THREE TIMES DAILY 11/05/22   Glenn Lange, DO  albuterol  (PROVENTIL ) (2.5 MG/3ML) 0.083% nebulizer solution Take 3 mLs (2.5 mg total) by nebulization every 2 (two) hours as needed for wheezing. 11/25/23   Ghimire, Estil Heman, MD  amLODipine  (NORVASC ) 5 MG tablet Take 5 mg by mouth daily. 07/29/21   [provider]  apixaban  (ELIQUIS ) 2.5 MG TABS tablet Take 1 tablet (2.5 mg total) by mouth 2 (two) times daily. Patient taking differently: Take 1.25 mg by mouth 2 (two) times daily. 10/04/22   Acharya, Gayatri A, MD  Ascorbic Acid (VITAMIN C PO) Take 1 tablet by mouth daily.    [provider]  Blood Glucose Monitoring Suppl (ACCU-CHEK AVIVA PLUS) w/Device KIT CHECK BLOOD GLUCOSE THREE TIMES DAILY 10/26/20    Autry-Lott, Jeneane Miracle, DO  brimonidine  (ALPHAGAN ) 0.2 % ophthalmic solution Place 1 drop into both eyes 2 (two) times daily.    [provider]  calcitRIOL  (ROCALTROL ) 0.25 MCG capsule TAKE 1 CAPSULE (0.25 MCG TOTAL) EVERY MONDAY, WEDNESDAY, AND FRIDAY Patient taking differently: Take 0.25 mcg by mouth in the morning and at bedtime. 06/22/22   Genora Kidd, MD  carvedilol  (COREG ) 12.5 MG tablet TAKE 1 TABLET (12.5 MG TOTAL) BY MOUTH 2 (TWO) TIMES DAILY WITH A MEAL. 12/22/21   Autry-Lott, Jeneane Miracle, DO  DROPLET INSULIN  SYRINGE 31G X 5/16" 0.5 ML MISC USE FOUR TIMES DAILY FOR INJECTIONS 09/03/22   Glenn Lange, DO  insulin  aspart (NOVOLOG  FLEXPEN) 100 UNIT/ML FlexPen 0-9 Units, Subcutaneous, 3 times daily with meals CBG < 70: Implement Hypoglycemia measures CBG 70 - 120: 0 units CBG 121 - 150: 1 unit CBG 151 - 200: 2 units CBG 201 - 250: 3 units CBG 251 - 300: 5 units CBG 301 - 350: 7 units CBG 351 - 400: 9 units CBG > 400: call MD 11/25/23   Burton Casey, MD  insulin  glargine (LANTUS ) 100 UNIT/ML injection Inject 0.12 mLs (12 Units total) into the skin daily. Please schedule appointment before next refill. 11/25/23   Ghimire, Estil Heman, MD  Insulin  Syringes, Disposable, U-100 0.5 ML MISC 4x daily injections 03/21/18   Riccio, Angela C, DO  Lancet Devices (ACCU-CHEK [facility]) lancets Fill for 1 month for TID testing. Use as instructed 11/13/13   Keith Pat, MD  latanoprost  (XALATAN ) 0.005 % ophthalmic solution Place 1 drop into both eyes at bedtime.    [provider]  melatonin 5 MG TABS Take 10 mg by mouth at bedtime as needed (for insomnia).    [provider]  Multiple Vitamin (MULTIVITAMIN WITH MINERALS) TABS Take 1 tablet by mouth daily.    [provider]  NEEDLE, DISP, 30 G (BD DISP NEEDLES) 30G X 1/2" MISC For 4x daily injections 11/05/15   Phelps, Jazma Y, DO  pantoprazole  (PROTONIX ) 40 MG tablet Take 1 tablet (40 mg total) by mouth daily. 11/25/23    Ghimire, Estil Heman, MD  polyethylene glycol (MIRALAX  / GLYCOLAX ) 17 g packet Take 17 g by mouth daily. 11/25/23   Ghimire, Estil Heman, MD  REPATHA SURECLICK 140 MG/ML SOAJ Inject 140 mg into the skin every 14 (fourteen) days. Patient not taking: Reported on 10/27/2023 09/16/23   [provider]  senna (SENOKOT) 8.6 MG TABS tablet Take 1 tablet (8.6 mg total) by mouth daily. 11/25/23   Ghimire, Estil Heman, MD  VITAMIN D , CHOLECALCIFEROL, PO Take 1 tablet by mouth daily.    [provider]      Allergies    Nsaids, Lisinopril, and Peanut-containing drug products    Review of Systems   Review of Systems  Physical Exam Updated Vital Signs There were no vitals taken for this visit. Physical Exam Vitals and nursing note reviewed.  Constitutional:      General: She is not in acute distress.    Appearance: She is well-developed.  HENT:     Head: Normocephalic and atraumatic.     Right Ear: External ear normal.     Left Ear: External ear normal.     Nose: Nose normal.  Eyes:     Extraocular Movements: Extraocular movements intact.     Conjunctiva/sclera: Conjunctivae normal.     Pupils: Pupils are equal, round, and reactive to light.  Cardiovascular:     Rate and Rhythm: Normal rate and regular rhythm.     Heart sounds: No murmur heard.    Comments: Tunneled catheter in R chest wall Pulmonary:     Effort: Pulmonary effort is normal. No respiratory distress.     Breath sounds: Normal breath sounds.  Musculoskeletal:     Cervical back: Normal range of motion and neck supple.     Right lower leg: Edema (1+) present.     Left lower leg: Edema (1+) present.  Skin:    General: Skin is warm and dry.  Neurological:     Mental Status: She is alert and oriented to person, place, and time. Mental status is at baseline.  Psychiatric:        Mood and Affect: Mood normal.     ED Results / Procedures / Treatments   Labs (all labs ordered are listed, but only abnormal results  are displayed) Labs Reviewed  CBC WITH DIFFERENTIAL/PLATELET  BASIC METABOLIC PANEL WITH GFR    EKG None  Radiology No results found.  Procedures Procedures  {Document cardiac monitor, telemetry assessment procedure when appropriate:1}  Medications Ordered in ED Medications  sodium chloride  0.9 % bolus 500 mL (has no administration in time range)  ondansetron  (ZOFRAN ) injection 4 mg (has no administration in time range)    ED Course/ Medical Decision Making/ A&P   {   Click here for ABCD2, HEART and other calculatorsREFRESH Note before signing :1}  Medical Decision Making Amount and/or Complexity of Data Reviewed Labs: ordered. Radiology: ordered.  Risk Prescription drug management.   ***  {Document critical care time when appropriate:1} {Document review of labs and clinical decision tools ie heart score, Chads2Vasc2 etc:1}  {Document your independent review of radiology images, and any outside records:1} {Document your discussion with family members, caretakers, and with consultants:1} {Document social determinants of health affecting pt's care:1} {Document your decision making why or why not admission, treatments were needed:1} Final Clinical Impression(s) / ED Diagnoses Final diagnoses:  None    Rx / DC Orders ED Discharge Orders     None

## 2024-02-01 NOTE — ED Notes (Signed)
 Went to CT

## 2024-02-01 NOTE — ED Triage Notes (Signed)
 Pt bib ptar with reports of falling on Thursday. Pt has been feeling dizzy since before the fall. Endorses hitting posterior head, complaining of frontal headache. Possible LOC, on eliquis . PERRLA.

## 2024-02-01 NOTE — Discharge Instructions (Signed)
 You were seen for your dizziness, fall, and head injury in the emergency department.   At home, please take 7 units of the lantus  (instead of 10) and take 2.5 mg of the amlodipine  at bedtime only.    Check your MyChart online for the results of any tests that had not resulted by the time you left the emergency department.   Follow-up with your primary doctor in 2-3 days regarding your visit.    Return immediately to the emergency department if you experience any of the following: worsening dizziness, falls, or any other concerning symptoms.    Thank you for visiting our Emergency Department. It was a pleasure taking care of you today.

## 2024-02-01 NOTE — ED Notes (Signed)
 Patient dc'ed home no acute distress no complaints offered patient assisted to lobby via wheelchair.

## 2024-02-14 ENCOUNTER — Other Ambulatory Visit: Payer: Self-pay | Admitting: Student

## 2024-02-14 DIAGNOSIS — Z1231 Encounter for screening mammogram for malignant neoplasm of breast: Secondary | ICD-10-CM

## 2024-02-19 ENCOUNTER — Other Ambulatory Visit: Payer: Self-pay | Admitting: Vascular Surgery

## 2024-02-19 DIAGNOSIS — N184 Chronic kidney disease, stage 4 (severe): Secondary | ICD-10-CM

## 2024-02-27 ENCOUNTER — Ambulatory Visit

## 2024-03-02 ENCOUNTER — Encounter (HOSPITAL_COMMUNITY): Payer: Self-pay | Admitting: *Deleted

## 2024-03-03 ENCOUNTER — Other Ambulatory Visit: Payer: Self-pay

## 2024-03-03 ENCOUNTER — Encounter (HOSPITAL_COMMUNITY): Payer: Self-pay | Admitting: Emergency Medicine

## 2024-03-03 ENCOUNTER — Emergency Department (HOSPITAL_COMMUNITY)
Admission: EM | Admit: 2024-03-03 | Discharge: 2024-03-03 | Disposition: A | Discharge status: Home / Self Care | Attending: Emergency Medicine | Admitting: Emergency Medicine

## 2024-03-03 DIAGNOSIS — Z91010 Allergy to peanuts: Secondary | ICD-10-CM | POA: Diagnosis not present

## 2024-03-03 DIAGNOSIS — N186 End stage renal disease: Secondary | ICD-10-CM | POA: Diagnosis not present

## 2024-03-03 DIAGNOSIS — Z79899 Other long term (current) drug therapy: Secondary | ICD-10-CM | POA: Diagnosis not present

## 2024-03-03 DIAGNOSIS — Z794 Long term (current) use of insulin: Secondary | ICD-10-CM | POA: Insufficient documentation

## 2024-03-03 DIAGNOSIS — E876 Hypokalemia: Secondary | ICD-10-CM | POA: Insufficient documentation

## 2024-03-03 DIAGNOSIS — I2510 Atherosclerotic heart disease of native coronary artery without angina pectoris: Secondary | ICD-10-CM | POA: Insufficient documentation

## 2024-03-03 DIAGNOSIS — R42 Dizziness and giddiness: Secondary | ICD-10-CM | POA: Diagnosis present

## 2024-03-03 DIAGNOSIS — I120 Hypertensive chronic kidney disease with stage 5 chronic kidney disease or end stage renal disease: Secondary | ICD-10-CM | POA: Insufficient documentation

## 2024-03-03 DIAGNOSIS — Z992 Dependence on renal dialysis: Secondary | ICD-10-CM | POA: Diagnosis not present

## 2024-03-03 DIAGNOSIS — Z7901 Long term (current) use of anticoagulants: Secondary | ICD-10-CM | POA: Insufficient documentation

## 2024-03-03 DIAGNOSIS — I959 Hypotension, unspecified: Secondary | ICD-10-CM | POA: Insufficient documentation

## 2024-03-03 DIAGNOSIS — E1122 Type 2 diabetes mellitus with diabetic chronic kidney disease: Secondary | ICD-10-CM | POA: Insufficient documentation

## 2024-03-03 LAB — COMPREHENSIVE METABOLIC PANEL WITH GFR
ALT: 7 U/L (ref 0–44)
AST: 14 U/L — ABNORMAL LOW (ref 15–41)
Albumin: 2.3 g/dL — ABNORMAL LOW (ref 3.5–5.0)
Alkaline Phosphatase: 42 U/L (ref 38–126)
Anion gap: 7 (ref 5–15)
BUN: 8 mg/dL (ref 8–23)
CO2: 26 mmol/L (ref 22–32)
Calcium: 9.1 mg/dL (ref 8.9–10.3)
Chloride: 104 mmol/L (ref 98–111)
Creatinine, Ser: 4.19 mg/dL — ABNORMAL HIGH (ref 0.44–1.00)
GFR, Estimated: 11 mL/min — ABNORMAL LOW (ref >60)
Glucose, Bld: 143 mg/dL — ABNORMAL HIGH (ref 70–99)
Potassium: 3.3 mmol/L — ABNORMAL LOW (ref 3.5–5.1)
Sodium: 137 mmol/L (ref 135–145)
Total Bilirubin: 0.6 mg/dL (ref 0.0–1.2)
Total Protein: 6.1 g/dL — ABNORMAL LOW (ref 6.5–8.1)

## 2024-03-03 LAB — CBC
HCT: 34.5 % — ABNORMAL LOW (ref 36.0–46.0)
Hemoglobin: 10.2 g/dL — ABNORMAL LOW (ref 12.0–15.0)
MCH: 26 pg (ref 26.0–34.0)
MCHC: 29.6 g/dL — ABNORMAL LOW (ref 30.0–36.0)
MCV: 88 fL (ref 80.0–100.0)
Platelets: 157 10*3/uL (ref 150–400)
RBC: 3.92 MIL/uL (ref 3.87–5.11)
RDW: 17.6 % — ABNORMAL HIGH (ref 11.5–15.5)
WBC: 9.8 10*3/uL (ref 4.0–10.5)
nRBC: 0 % (ref 0.0–0.2)

## 2024-03-03 LAB — I-STAT CG4 LACTIC ACID, ED: Lactic Acid, Venous: 1.6 mmol/L (ref 0.5–1.9)

## 2024-03-03 MED ORDER — SODIUM CHLORIDE 0.9 % IV BOLUS
1000.0000 mL | Freq: Once | INTRAVENOUS | Status: AC
  Administered 2024-03-03: 1000 mL via INTRAVENOUS

## 2024-03-03 MED ORDER — POTASSIUM CHLORIDE CRYS ER 20 MEQ PO TBCR
40.0000 meq | EXTENDED_RELEASE_TABLET | Freq: Once | ORAL | Status: AC
  Administered 2024-03-03: 40 meq via ORAL
  Filled 2024-03-03: qty 2

## 2024-03-03 NOTE — ED Triage Notes (Signed)
 BIB EMS from home.  Called out for hypotension.  Pt reported to EMS that her BP was low after dialysis when she got home about 1600.  Pt reported it was low even before dialysis.  Initial BP with EMS was 68/40.  Pt received 400 NS 20G RH

## 2024-03-03 NOTE — ED Provider Notes (Signed)
 [place] EMERGENCY DEPARTMENT AT [facility] Provider Note   CSN: [id number] Arrival date & time: 03/03/24  0865     History  Chief Complaint  Patient presents with   Hypotension    [name] is a 69 y.o. female with medical history of anemia, atrial fibrillation on Eliquis , CAD, ESRD HD Monday Wednesday Friday, constipation, diabetes, hypertension.  Patient presents to the ED for evaluation of hypertension.  The patient reports that ever since being started on dialysis in February she has had intermittent issues controlling her blood pressure.  States that sometimes all below, sometimes normal.  Reports that this morning she woke up and was getting ready for dialysis when she noted that she was lightheaded and dizzy prompting her to check her blood pressure.  She states that the blood pressure was low at this time.  She reports he went to dialysis where she was dialyzed and continued to have hypotension after returning home.  She states that she then again stood up and became lightheaded and dizzy and called 911.  Patient apparently was found with a blood pressure of 68/40, she received 400 normal saline.  Patient reports that she has had no nausea, vomiting or diarrhea recently.  She does have colostomy in place at this time and is scheduled to see [facility] surgery for reversal soon.  She denies any fevers at home.  Denies any chest pain or shortness of breath.  Denies lightheadedness or dizziness at this time but does endorse generalized weakness.  She states that last week her blood pressure was 60/30 with her home health aides who advised her to eat a meal and drink some water which improved her blood pressure 80 systolic.  She was not seen for this.  HPI     Home Medications Prior to Admission medications   Medication Sig Start Date End Date Taking? Authorizing Provider  ACCU-CHEK AVIVA PLUS test strip CHECK BLOOD GLUCOSE THREE TIMES DAILY 11/05/22   Glenn Lange, DO  albuterol  (PROVENTIL ) (2.5 MG/3ML) 0.083% nebulizer solution Take 3 mLs (2.5 mg total) by nebulization every 2 (two) hours as needed for wheezing. 11/25/23   Ghimire, Estil Heman, MD  amLODipine  (NORVASC ) 5 MG tablet Take 0.5 tablets (2.5 mg total) by mouth at bedtime. 02/01/24 03/02/24  Ninetta Basket, MD  apixaban  (ELIQUIS ) 2.5 MG TABS tablet Take 1 tablet (2.5 mg total) by mouth 2 (two) times daily. Patient taking differently: Take 1.25 mg by mouth 2 (two) times daily. 10/04/22   Acharya, Gayatri A, MD  Ascorbic Acid (VITAMIN C PO) Take 1 tablet by mouth daily.    [provider]  Blood Glucose Monitoring Suppl (ACCU-CHEK AVIVA PLUS) w/Device KIT CHECK BLOOD GLUCOSE THREE TIMES DAILY 10/26/20   Autry-Lott, Jeneane Miracle, DO  brimonidine  (ALPHAGAN ) 0.2 % ophthalmic solution Place 1 drop into both eyes 2 (two) times daily.    [provider]  calcitRIOL  (ROCALTROL ) 0.25 MCG capsule TAKE 1 CAPSULE (0.25 MCG TOTAL) EVERY MONDAY, WEDNESDAY, AND FRIDAY Patient taking differently: Take 0.25 mcg by mouth in the morning and at bedtime. 06/22/22   Genora Kidd, MD  carvedilol  (COREG ) 12.5 MG tablet TAKE 1 TABLET (12.5 MG TOTAL) BY MOUTH 2 (TWO) TIMES DAILY WITH A MEAL. 12/22/21   Autry-Lott, Jeneane Miracle, DO  DROPLET INSULIN  SYRINGE 31G X 5/16 0.5 ML MISC USE FOUR TIMES DAILY FOR INJECTIONS 09/03/22   Glenn Lange, DO  insulin  aspart (NOVOLOG  FLEXPEN) 100 UNIT/ML FlexPen 0-9 Units, Subcutaneous, 3 times daily  with meals CBG < 70: Implement Hypoglycemia measures CBG 70 - 120: 0 units CBG 121 - 150: 1 unit CBG 151 - 200: 2 units CBG 201 - 250: 3 units CBG 251 - 300: 5 units CBG 301 - 350: 7 units CBG 351 - 400: 9 units CBG > 400: call MD 11/25/23   Burton Casey, MD  insulin  glargine (LANTUS ) 100 UNIT/ML injection Inject 0.07 mLs (7 Units total) into the skin daily. Please schedule appointment before next refill. 02/01/24   Ninetta Basket, MD  Insulin  Syringes, Disposable, U-100 0.5 ML MISC  4x daily injections 03/21/18   Riccio, Angela C, DO  Lancet Devices (ACCU-CHEK [facility]) lancets Fill for 1 month for TID testing. Use as instructed 11/13/13   Keith Pat, MD  latanoprost  (XALATAN ) 0.005 % ophthalmic solution Place 1 drop into both eyes at bedtime.    [provider]  melatonin 5 MG TABS Take 10 mg by mouth at bedtime as needed (for insomnia).    [provider]  Multiple Vitamin (MULTIVITAMIN WITH MINERALS) TABS Take 1 tablet by mouth daily.    [provider]  NEEDLE, DISP, 30 G (BD DISP NEEDLES) 30G X 1/2 MISC For 4x daily injections 11/05/15   Phelps, Jazma Y, DO  pantoprazole  (PROTONIX ) 40 MG tablet Take 1 tablet (40 mg total) by mouth daily. 11/25/23   Ghimire, Estil Heman, MD  polyethylene glycol (MIRALAX  / GLYCOLAX ) 17 g packet Take 17 g by mouth daily. 11/25/23   Ghimire, Estil Heman, MD  REPATHA SURECLICK 140 MG/ML SOAJ Inject 140 mg into the skin every 14 (fourteen) days. Patient not taking: Reported on 10/27/2023 09/16/23   [provider]  senna (SENOKOT) 8.6 MG TABS tablet Take 1 tablet (8.6 mg total) by mouth daily. 11/25/23   Ghimire, Estil Heman, MD  VITAMIN D , CHOLECALCIFEROL, PO Take 1 tablet by mouth daily.    [provider]      Allergies    Nsaids, Lisinopril, and Peanut-containing drug products    Review of Systems   Review of Systems  Neurological:  Positive for weakness.  All other systems reviewed and are negative.   Physical Exam Updated Vital Signs BP (!) 104/47   Pulse 78   Temp (!) 97.5 F (36.4 C) (Oral)   Resp 17   SpO2 100%  Physical Exam Vitals and nursing note reviewed.  Constitutional:      General: She is not in acute distress.    Appearance: She is well-developed.  HENT:     Head: Normocephalic and atraumatic.  Eyes:     Conjunctiva/sclera: Conjunctivae normal.  Cardiovascular:     Rate and Rhythm: Normal rate and regular rhythm.     Heart sounds: No murmur heard. Pulmonary:      Effort: Pulmonary effort is normal. No respiratory distress.     Breath sounds: Normal breath sounds.  Abdominal:     Palpations: Abdomen is soft.     Tenderness: There is no abdominal tenderness.  Musculoskeletal:        General: No swelling.     Cervical back: Neck supple.  Skin:    General: Skin is warm and dry.     Capillary Refill: Capillary refill takes less than 2 seconds.  Neurological:     Mental Status: She is alert and oriented to person, place, and time. Mental status is at baseline.     Comments: CN III through XII intact.  Intact finger-nose, heel-to-shin.  No pronator  drift, no slurred speech, no facial droop.  5 out of 5 strength bilateral upper extremities.  5 out of 5 strength bilateral lower extremities.  Pupils PERRL.  She tracks across midline.  Psychiatric:        Mood and Affect: Mood normal.     ED Results / Procedures / Treatments   Labs (all labs ordered are listed, but only abnormal results are displayed) Labs Reviewed  COMPREHENSIVE METABOLIC PANEL WITH GFR - Abnormal; Notable for the following components:      Result Value   Potassium 3.3 (*)    Glucose, Bld 143 (*)    Creatinine, Ser 4.19 (*)    Total Protein 6.1 (*)    Albumin  2.3 (*)    AST 14 (*)    GFR, Estimated 11 (*)    All other components within normal limits  CBC - Abnormal; Notable for the following components:   Hemoglobin 10.2 (*)    HCT 34.5 (*)    MCHC 29.6 (*)    RDW 17.6 (*)    All other components within normal limits  I-STAT CG4 LACTIC ACID, ED    EKG EKG Interpretation Date/Time:  Tuesday March 03 2024 01:43:44 EDT Ventricular Rate:  78 PR Interval:  159 QRS Duration:  165 QT Interval:  399 QTC Calculation: 440 R Axis:   14  Text Interpretation: Sinus rhythm Multiple premature complexes, vent & supraven Nonspecific intraventricular conduction delay No significant change was found Confirmed by Earma Gloss [phone]) on 03/03/2024 1:47:18 AM  Radiology No results  found.  Procedures Procedures    Medications Ordered in ED Medications  sodium chloride  0.9 % bolus 1,000 mL (0 mLs Intravenous Stopped 03/03/24 0400)  potassium chloride  SA (KLOR-CON  M) CR tablet 40 mEq (40 mEq Oral Given 03/03/24 4132)    ED Course/ Medical Decision Making/ A&P  Medical Decision Making Amount and/or Complexity of Data Reviewed Labs: ordered.  Risk Prescription drug management.   68 year old female presents for evaluation.  Please see HPI for further details.  On examination the patient is afebrile and nontachycardic.  Lung sounds are clear bilaterally, she is not hypoxic.  Abdomen is soft and compressible.  Neurological examination is a baseline.  Overall the patient is nontoxic in appearance.  She denies lightheadedness or dizziness.  She does endorse generalized weakness.  Will assess patient utilizing CBC, CMP, i-STAT lactic, EKG.  Patient given 1 L of fluid.  During discussion with patient, she reports that she has been placed on midodrine in the past.  She states she is supposed takes medication when her systolic blood pressure is under 90.  She reports that she simply forgot about this tonight before calling EMS.  Patient CBC here with leukocytosis, baseline hemoglobin 10.2.  Metabolic panel with potassium 3.3 repleted with 40 mEq oral potassium.  Creatinine 4.9, GFR 11, anion gap 7.  Lactic acid not elevated 1.6.  EKG nonischemic.  Orthostatic vital signs were collected.  Patient appears orthostatic.  Given liter of fluid and on reassessment, no longer orthostatic.  At this time, patient was ambulated.  She ambulates with a steady gait.  She reports she does use a cane at baseline.  She states she feels comfortable going home.  The patient will be discharged home at this time.  She was discussed with my attending Dr. Alison Irvine and he voices agreement with plan of manage.  The patient had all of her questions answered to her satisfaction.  She was advised to  follow-up with  nephrology.  She was given return precautions and she voiced understanding.  Stable to discharge.   Final Clinical Impression(s) / ED Diagnoses Final diagnoses:  Hypotension, unspecified hypotension type    Rx / DC Orders ED Discharge Orders     None         Adel Aden, PA-C 03/03/24 0531    Earma Gloss, MD 03/04/24 [phone]

## 2024-03-03 NOTE — Discharge Instructions (Signed)
 It was a pleasure taking part in your care.  As discussed, your work appears reassuring.  Please continue taking all medications as prescribed.  Please follow-up with your nephrology team concerning your low blood pressure.  In the future, if your blood pressure is less than 90, please take midodrine as directed.  Please return to the ED with any new or worsening symptoms.

## 2024-03-05 ENCOUNTER — Encounter: Payer: Self-pay | Admitting: Podiatry

## 2024-03-05 ENCOUNTER — Ambulatory Visit

## 2024-03-05 ENCOUNTER — Ambulatory Visit: Admitting: Podiatry

## 2024-03-05 ENCOUNTER — Ambulatory Visit (INDEPENDENT_AMBULATORY_CARE_PROVIDER_SITE_OTHER)

## 2024-03-05 VITALS — Ht 67.0 in | Wt 190.7 lb

## 2024-03-05 DIAGNOSIS — L97311 Non-pressure chronic ulcer of right ankle limited to breakdown of skin: Secondary | ICD-10-CM

## 2024-03-05 DIAGNOSIS — E1142 Type 2 diabetes mellitus with diabetic polyneuropathy: Secondary | ICD-10-CM | POA: Diagnosis not present

## 2024-03-05 DIAGNOSIS — L97312 Non-pressure chronic ulcer of right ankle with fat layer exposed: Secondary | ICD-10-CM

## 2024-03-05 DIAGNOSIS — I739 Peripheral vascular disease, unspecified: Secondary | ICD-10-CM | POA: Diagnosis not present

## 2024-03-05 NOTE — Progress Notes (Unsigned)
 Chief Complaint  Patient presents with   Ankle Injury    Pt is here due to right ankle injury she states that she scrap her ankle on her desk last Wednesday and develop a wound has been doctoring it at, came to get some professional help for it.    HPI: 68 y.o. female presenting today with right ankle ulceration.  She reports that she hit the outside right ankle little over a week ago and the wound opened up.  She does have history of end-stage renal disease on dialysis, fairly well-controlled diabetes, history of ankle ORIF sometime ago.  Past Medical History:  Diagnosis Date   Anemia    Arthritis    Atrial fibrillation (HCC)    CAD (coronary artery disease)    Chronic kidney disease, stage III (moderate) (HCC)    Constipation    Diabetes mellitus (HCC)    History of blood transfusion    HLD (hyperlipidemia)    Hypertension    Myocardial infarction [facility])    in April 2014   Past Surgical History:  Procedure Laterality Date   ANKLE SURGERY Right    x 6 total   BREAST BIOPSY Left    15 or 20 yearsa ago she cant remember laterality    HARDWARE REMOVAL Right 02/13/2013   Procedure: HARDWARE REMOVAL;  Surgeon: Timothy Ford, MD;  Location: [facility] OR;  Service: Orthopedics;  Laterality: Right;  Right Ankle Removal Hardware, Debridement, Place Wound VAC   IR FLUORO GUIDE CV LINE LEFT  11/08/2023   IR FLUORO GUIDE CV LINE RIGHT  11/01/2023   IR FLUORO GUIDE CV LINE RIGHT  11/12/2023   IR US  GUIDE VASC ACCESS LEFT  11/08/2023   IR US  GUIDE VASC ACCESS RIGHT  11/01/2023   IR US  GUIDE VASC ACCESS RIGHT  11/12/2023   laser surgery for diabetic retinopathy Bilateral    LEFT HEART CATHETERIZATION WITH CORONARY ANGIOGRAM N/A 01/09/2013   Procedure: LEFT HEART CATHETERIZATION WITH CORONARY ANGIOGRAM;  Surgeon: Mardell Shade, MD;  Location: [facility] CATH LAB;  Service: Cardiovascular;  Laterality: N/A;   ORIF ANKLE FRACTURE Right 01/12/2013   Procedure: OPEN REDUCTION INTERNAL FIXATION (ORIF) ANKLE  FRACTURE;  Surgeon: Timothy Ford, MD;  Location: [facility] OR;  Service: Orthopedics;  Laterality: Right;   PARTIAL COLECTOMY N/A 11/02/2023   Procedure: OPEN PARTIAL COLECTOMY WITH END COLOSTOMY;  Surgeon: Lujean Sake, MD;  Location: [facility] OR;  Service: General;  Laterality: N/A;   PARTIAL HYSTERECTOMY     Allergies  Allergen Reactions   Nsaids Other (See Comments)    CKD stage 4   Lisinopril Other (See Comments)    coughing   Peanut-Containing Drug Products Itching and Other (See Comments)    GI intolerance - diarrhea      PHYSICAL EXAM: There were no vitals filed for this visit.  General: The patient is alert and oriented x3 in no acute distress.  Dermatology: Skin is warm, dry and supple bilateral lower extremities. Interspaces are clear of maceration and debris.      Wound 1:  Location: Right lateral malleolus        Depth: Subcutaneous tissue        Wound Border: Macerated        Wound Base: Fibrotic        Drainage: Scant serous       Odor?:  No        Surrounding Tissue: Edematous        Infected?:  No,  overlying bioburden present        Necrosis?:  Fibronecrotic base        Pain?:  No        Tunneling: No       Dimensions (cm): 3 x 2.6 x 0.2 cm predebridement, 3.3 x 2.9 x 0.2 cm postdebridement with bleeding wound edge, scant bleeding to wound base.    Vascular: Pedal pulses are difficult to palpate due to edema, PT pulse nonpalpable, DP pulse faint.  Capillary refill approximately 3 seconds to the digits.  Pedal hair growth diminished.  +1 to +2 pitting edema present  Neurological: Light touch sensation intact, protective sensation decreased to forefoot, vibratory sensation decreased  Musculoskeletal Exam: Ankle arthritis noted.  Pes planus noted.     Latest Ref Rng & Units 10/28/2023    5:22 AM  Hemoglobin A1C  Hemoglobin-A1c 4.8 - 5.6 % 6.6      RADIOGRAPHIC EXAM: Right ankle radiographs 03/05/2024, AP, oblique, lateral views Hardware in place to fibula from  prior ORIF.  Severe arthritis to ankle joint with collapse of talus.  Likely chronic in nature.  Some diastases of distal tib-fib. Findings consistent with neuroarthropathy similar from prior studies from February 2022.  No osteolysis or cortical erosion at site of ulceration overlying lateral malleolus.  ASSESSMENT / PLAN OF CARE: 1. PAD (peripheral artery disease) (HCC)   2. Skin ulcer of right ankle with fat layer exposed (HCC)   3. Diabetic polyneuropathy associated with type 2 diabetes mellitus (HCC)      No orders of the defined types were placed in this encounter.  AMB REFERRAL TO WOUND CARE CENTER US  ARTERIAL LOWER EXTREMITY DUPLEX BILATERAL (NON-ABI)  Radiographs reviewed with patient, clinical findings were discussed with patient.  The ulceration was sharply debrided of devitalized soft tissue with sterile microcurette and tissue nipper to the level of subcutaneous tissue.  Hemostasis obtained.  Antibiotic ointment applied with Hydrofera Blue, 4 x 4 gauze, Kerlix, Ace wrap.  Reviewed home dressing changes, change dressing every 2 to 3 days.  Wound care supplies ordered to patient  Surgical shoe dispensed for patient to wear at all times WB to limit friction on the area.  Referral to wound care center placed.  May benefit from multilayer compression for edema control.  Do not suspect infected hardware at this point however there is minimal soft tissue coverage before reaching periosteum and capsule.  Noninvasive vascular studies ordered for baseline due to diabetic ulcer with poor edema control, end-stage renal disease.  Concern for Charcot component to the right ankle.  Discussed risks / concerns regarding ulcer with patient and possible sequelae if left untreated.  Stressed importance of infection prevention at home. Short-term goals are: prevent infection, off-load ulcer, heal ulcer Long-term goals are:  prevent recurrence, prevent amputation.   Return in about 1 week (around  03/12/2024) for Wound Care.   Martha Ellerby L. Lunda Salines, AACFAS [facility]     [address] [place], [place] [address]                Office [phone]  Fax [phone]

## 2024-03-09 ENCOUNTER — Telehealth: Payer: Self-pay

## 2024-03-09 NOTE — Telephone Encounter (Signed)
-----   Message from Reina Cara sent at 03/06/2024  1:57 PM EDT ----- Regarding: Wound care supplies Hello Somaly Marteney,  This patient needs wound care supplies for a right lateral ankle ulceration that is full-thickness.  Prescription measurements 3.3 x 0.9 x 0.2 cm.  Fibrotic base.  Debrided 03/05/2024.  She needs Aquacel Ag, 4 x 4 gauze, Kerlix, Ace wrap; or Aquacel Ag and bordered foam dressing.  Needs wound cleanser as well.  Wound dressing needs to be changed every other day.  Thank you

## 2024-03-09 NOTE — Telephone Encounter (Signed)
 Order form, office note and patient demographics faxed to [facility]  [phone]

## 2024-03-10 ENCOUNTER — Ambulatory Visit

## 2024-03-10 ENCOUNTER — Ambulatory Visit (HOSPITAL_COMMUNITY): Admission: RE | Admit: 2024-03-10 | Source: Ambulatory Visit

## 2024-03-10 ENCOUNTER — Ambulatory Visit: Admitting: Vascular Surgery

## 2024-03-10 ENCOUNTER — Ambulatory Visit (HOSPITAL_COMMUNITY)

## 2024-03-12 ENCOUNTER — Ambulatory Visit (INDEPENDENT_AMBULATORY_CARE_PROVIDER_SITE_OTHER): Admitting: Podiatry

## 2024-03-12 ENCOUNTER — Telehealth: Payer: Self-pay

## 2024-03-12 ENCOUNTER — Other Ambulatory Visit: Payer: Self-pay

## 2024-03-12 ENCOUNTER — Encounter: Payer: Self-pay | Admitting: Podiatry

## 2024-03-12 DIAGNOSIS — I739 Peripheral vascular disease, unspecified: Secondary | ICD-10-CM

## 2024-03-12 DIAGNOSIS — L97313 Non-pressure chronic ulcer of right ankle with necrosis of muscle: Secondary | ICD-10-CM | POA: Diagnosis not present

## 2024-03-12 DIAGNOSIS — L97312 Non-pressure chronic ulcer of right ankle with fat layer exposed: Secondary | ICD-10-CM

## 2024-03-12 DIAGNOSIS — E1142 Type 2 diabetes mellitus with diabetic polyneuropathy: Secondary | ICD-10-CM

## 2024-03-12 NOTE — Telephone Encounter (Signed)
 Wound care tried to call patient twice without a response, so they closed the referral - I have put in the new referral - hopefully we will have better luckI will message the patient with the contact information, so she can call to schedule

## 2024-03-12 NOTE — Progress Notes (Unsigned)
 Central area of capsule exposed.  Wound edges do appear improved however wound appears slightly larger.  Edema control.  Has not had from vascular testing, reordering ABI, has not heard from wound care center.  Did discuss possibly getting into a boot instead.

## 2024-03-12 NOTE — Telephone Encounter (Signed)
-----   Message from Reina Cara sent at 03/12/2024  1:44 PM EDT ----- Regarding: Referral status [name],  Can we please check the status on ABI testing and wound care center referral that was placed at patients last visit?

## 2024-03-26 ENCOUNTER — Ambulatory Visit: Admitting: Podiatry

## 2024-03-26 ENCOUNTER — Encounter: Payer: Self-pay | Admitting: Podiatry

## 2024-03-26 DIAGNOSIS — L97313 Non-pressure chronic ulcer of right ankle with necrosis of muscle: Secondary | ICD-10-CM

## 2024-03-26 DIAGNOSIS — I739 Peripheral vascular disease, unspecified: Secondary | ICD-10-CM

## 2024-03-26 DIAGNOSIS — E1142 Type 2 diabetes mellitus with diabetic polyneuropathy: Secondary | ICD-10-CM | POA: Diagnosis not present

## 2024-03-26 MED ORDER — SANTYL 250 UNIT/GM EX OINT: 1.0000 | TOPICAL_OINTMENT | Freq: Every day | CUTANEOUS | 0 refills | Status: DC

## 2024-03-26 NOTE — Progress Notes (Signed)
 Chief Complaint  Patient presents with   Foot Ulcer    Right foot lateral ankle ulcer. 0 pain. IDDM A1C 6.6. Wearing surgical shoe.     HPI: 68 y.o. female presenting today following up for right ankle ulceration.  Her son is accompanying her and has been assisting with her care.  She reports that the wound has appears to be doing ok, they feel that the wound has contracted in size slightly.  They state that they have not heard anything regarding vascular testing at this point.  They do have upcoming appointment with wound care center however this will be in August.  Past Medical History:  Diagnosis Date   Anemia    Arthritis    Atrial fibrillation (HCC)    CAD (coronary artery disease)    Chronic kidney disease, stage III (moderate) (HCC)    Constipation    Diabetes mellitus (HCC)    History of blood transfusion    HLD (hyperlipidemia)    Hypertension    Myocardial infarction [facility])    in April 2014   Past Surgical History:  Procedure Laterality Date   ANKLE SURGERY Right    x 6 total   BREAST BIOPSY Left    15 or 20 yearsa ago she cant remember laterality    HARDWARE REMOVAL Right 02/13/2013   Procedure: HARDWARE REMOVAL;  Surgeon: Jerona LULLA Sage, MD;  Location: [facility] OR;  Service: Orthopedics;  Laterality: Right;  Right Ankle Removal Hardware, Debridement, Place Wound VAC   IR FLUORO GUIDE CV LINE LEFT  11/08/2023   IR FLUORO GUIDE CV LINE RIGHT  11/01/2023   IR FLUORO GUIDE CV LINE RIGHT  11/12/2023   IR US  GUIDE VASC ACCESS LEFT  11/08/2023   IR US  GUIDE VASC ACCESS RIGHT  11/01/2023   IR US  GUIDE VASC ACCESS RIGHT  11/12/2023   laser surgery for diabetic retinopathy Bilateral    LEFT HEART CATHETERIZATION WITH CORONARY ANGIOGRAM N/A 01/09/2013   Procedure: LEFT HEART CATHETERIZATION WITH CORONARY ANGIOGRAM;  Surgeon: Toribio JONELLE Fuel, MD;  Location: [facility] CATH LAB;  Service: Cardiovascular;  Laterality: N/A;   ORIF ANKLE FRACTURE Right 01/12/2013   Procedure: OPEN REDUCTION  INTERNAL FIXATION (ORIF) ANKLE FRACTURE;  Surgeon: Jerona LULLA Sage, MD;  Location: [facility] OR;  Service: Orthopedics;  Laterality: Right;   PARTIAL COLECTOMY N/A 11/02/2023   Procedure: OPEN PARTIAL COLECTOMY WITH END COLOSTOMY;  Surgeon: Dasie Leonor CROME, MD;  Location: [facility] OR;  Service: General;  Laterality: N/A;   PARTIAL HYSTERECTOMY     Allergies  Allergen Reactions   Nsaids Other (See Comments)    CKD stage 4   Lisinopril Other (See Comments)    coughing   Peanut-Containing Drug Products Itching and Other (See Comments)    GI intolerance - diarrhea      PHYSICAL EXAM: There were no vitals filed for this visit.  General: The patient is alert and oriented x3 in no acute distress.  Dermatology: Skin is warm, dry and supple bilateral lower extremities. Interspaces are clear of maceration and debris.   The right lateral ankle ulceration wound margins have contracted somewhat however the depth and degree of deep fascia and capsule involvement appears to have progressed.  Measures 3 x 2 x 0.4 centers today.  No significant periwound erythema.  Scant serous drainage on dressing changes.  No focal warmth increased.  Vascular: Pedal pulses are difficult to palpate due to edema, PT pulse nonpalpable, DP pulse faint.  Capillary refill approximately 3 seconds  to the digits.  Pedal hair growth diminished.  +1 to +2 pitting edema present  Neurological: Light touch sensation intact, protective sensation decreased to forefoot, vibratory sensation decreased  Musculoskeletal Exam: Ankle arthritis noted.  Pes planus noted.     Latest Ref Rng & Units 10/28/2023    5:22 AM  Hemoglobin A1C  Hemoglobin-A1c 4.8 - 5.6 % 6.6      RADIOGRAPHIC EXAM: Right ankle radiographs 03/05/2024, AP, oblique, lateral views Hardware in place to fibula from prior ORIF.  Severe arthritis to ankle joint with collapse of talus.  Likely chronic in nature.  Some diastases of distal tib-fib. Findings consistent with neuroarthropathy  similar from prior studies from February 2022.  No osteolysis or cortical erosion at site of ulceration overlying lateral malleolus.  ASSESSMENT / PLAN OF CARE: 1. PAD (peripheral artery disease) (HCC)   2. Skin ulcer of right ankle with necrosis of muscle (HCC)   3. Diabetic polyneuropathy associated with type 2 diabetes mellitus (HCC)      Meds ordered this encounter  Medications   collagenase (SANTYL) 250 UNIT/GM ointment    Sig: Apply 1 Application topically daily. Right lateral ankle 3 x 2 cm with muscle/fascia capsule exposed    Dispense:  30 g    Refill:  0   AMB REFERRAL TO VASCULAR SURGERY/CLINIC  Excisional debridement of the right lateral ulceration was deferred today due to progressing wound depth and concern for vascular status.  Was cleaned with wound cleanser and sterile gauze.  Today the ulceration was dressed with Hydrofera Blue, silicone bordered foam dressing secured in place with light Ace wrap.  The wound margins have contracted slightly however there is increase involvement of the deep fascia and capsule layer, measuring 3 x 2 x 0.4 cm today.  Given the concerns of vascular status and progress in depth, describing Santyl ointment to be applied daily with saline wet-to-dry gauze.  Continue their wound care regimen until they receive this.  They state they have not been contacted regarding vascular testing which has previously been ordered but not been obtained.  It is possible that this could be due to patient's dialysis sessions, she cannot use her phone and does not have service during these times.  Placing urgent referral to vascular surgery for evaluation of PAD due to stagnant wound in the setting of PAD, ESRD, diabetes.  She remains at high risk of developing infection and a high risk of limb loss.  Return in about 2 weeks (around 04/09/2024) for Wound Care.   [name], AACFAS [facility]     [address] [place], [place] [address]                Office [phone]  Fax [phone]

## 2024-03-31 ENCOUNTER — Telehealth: Payer: Self-pay

## 2024-03-31 ENCOUNTER — Ambulatory Visit
Admission: RE | Admit: 2024-03-31 | Discharge: 2024-03-31 | Disposition: A | Discharge status: Home / Self Care | Source: Ambulatory Visit | Attending: Student

## 2024-03-31 DIAGNOSIS — Z1231 Encounter for screening mammogram for malignant neoplasm of breast: Secondary | ICD-10-CM

## 2024-03-31 NOTE — Telephone Encounter (Signed)
 New order form faxed to [facility] 

## 2024-03-31 NOTE — Telephone Encounter (Signed)
-----   Message from Ethan LITTIE Saddler sent at 03/26/2024  2:02 PM EDT ----- Regarding: Dressing supplies and vascular testing [name],  I know you have had difficulty contacting this patient.  Could be worth coordinating with her son [name] going forward.  I have gone ahead and place an urgent referral to vascular surgery even though it seems she hasn't had vascular testing scheduled.  I am also switching her wound care to saline wet-to-dry with Santyl .  This needs to be changed daily.  They need 4 x 4 gauze, Kerlix, Ace wrap's.  The right lateral ankle ulceration was last debrided on June 19.  Wound margins have contracted slightly but holding off on further excisional debridement due to increased depth.  Wound measures 3 x 2 x 0.4 cm today with deep fascia and capsule exposed.  Thank you, Dr. GORMAN

## 2024-04-07 ENCOUNTER — Telehealth: Payer: Self-pay | Admitting: Podiatry

## 2024-04-07 NOTE — Telephone Encounter (Signed)
 As per patient, company that was given by provider does not sell ointment. Patient would like provider to send presecription to [facility] on [address] for ointment (Santyl ) Patient contact telephone number, [phone]

## 2024-04-14 ENCOUNTER — Other Ambulatory Visit: Payer: Self-pay | Admitting: Podiatry

## 2024-04-14 ENCOUNTER — Telehealth: Payer: Self-pay | Admitting: Podiatry

## 2024-04-14 ENCOUNTER — Telehealth: Payer: Self-pay | Admitting: Lab

## 2024-04-14 ENCOUNTER — Ambulatory Visit (HOSPITAL_COMMUNITY): Admitting: Nurse Practitioner

## 2024-04-14 DIAGNOSIS — L97313 Non-pressure chronic ulcer of right ankle with necrosis of muscle: Secondary | ICD-10-CM

## 2024-04-14 DIAGNOSIS — I739 Peripheral vascular disease, unspecified: Secondary | ICD-10-CM

## 2024-04-14 NOTE — Telephone Encounter (Signed)
 The patient is still waiting for the order for Collagenase  (SANTYL ) 250 UNIT/GM ointment to be sent to the following pharmacy:  [facility] [address] [place], [place] [address] Phone: [phone]  Additionally, the patient is requesting dressing supplies. Could you please clarify whether these will be provided by us  or the home care agency? She reports that she currently has none.

## 2024-04-14 NOTE — Telephone Encounter (Signed)
 Error, another one created

## 2024-04-14 NOTE — Progress Notes (Signed)
 Home health orders placed for patient.  There has been delay in getting the patient established with wound care center. 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  She is right ankle ulceration with involvement of capsule the time of my last examination which measured 3 x 2.5 cm regarding the area of deepest involvement.  Orders placed for daily dressing changes with collagenase  ointment applied nickel thick hydrated with normal sterile saline.  Cover with Hydrofera Blue foam bandage, hydrated as necessary, secured in place with large bordered foam secondary bandage or light 2 layer bandage with 4 x 4 gauze padding over anterior ankle and bony prominences with light conforming gauze or Kling wrap, light Ace wrap under minimal compression.  Home health likely will be able to assist 3 times a week.

## 2024-04-14 NOTE — Telephone Encounter (Signed)
 Ida from [facility] called and has asked for wound care orders for this patient states went into the home and her wound isn't being cared for as it should prone to infections at this time.

## 2024-04-15 ENCOUNTER — Ambulatory Visit (HOSPITAL_COMMUNITY)
Admission: RE | Admit: 2024-04-15 | Discharge: 2024-04-15 | Disposition: A | Discharge status: Home / Self Care | Attending: Vascular Surgery | Admitting: Vascular Surgery

## 2024-04-15 ENCOUNTER — Other Ambulatory Visit: Payer: Self-pay

## 2024-04-15 ENCOUNTER — Encounter (HOSPITAL_COMMUNITY)
Admission: RE | Disposition: A | Discharge status: Home / Self Care | Payer: Self-pay | Source: Home / Self Care | Attending: Vascular Surgery

## 2024-04-15 ENCOUNTER — Encounter (HOSPITAL_COMMUNITY): Payer: Self-pay | Admitting: Vascular Surgery

## 2024-04-15 DIAGNOSIS — I120 Hypertensive chronic kidney disease with stage 5 chronic kidney disease or end stage renal disease: Secondary | ICD-10-CM | POA: Insufficient documentation

## 2024-04-15 DIAGNOSIS — N186 End stage renal disease: Secondary | ICD-10-CM | POA: Diagnosis not present

## 2024-04-15 DIAGNOSIS — T8249XA Other complication of vascular dialysis catheter, initial encounter: Secondary | ICD-10-CM

## 2024-04-15 DIAGNOSIS — Y839 Surgical procedure, unspecified as the cause of abnormal reaction of the patient, or of later complication, without mention of misadventure at the time of the procedure: Secondary | ICD-10-CM | POA: Insufficient documentation

## 2024-04-15 DIAGNOSIS — Z992 Dependence on renal dialysis: Secondary | ICD-10-CM | POA: Diagnosis not present

## 2024-04-15 DIAGNOSIS — E1122 Type 2 diabetes mellitus with diabetic chronic kidney disease: Secondary | ICD-10-CM | POA: Insufficient documentation

## 2024-04-15 DIAGNOSIS — L97313 Non-pressure chronic ulcer of right ankle with necrosis of muscle: Secondary | ICD-10-CM

## 2024-04-15 DIAGNOSIS — I739 Peripheral vascular disease, unspecified: Secondary | ICD-10-CM

## 2024-04-15 HISTORY — PX: TUNNELLED CATHETER EXCHANGE: CATH118373

## 2024-04-15 LAB — GLUCOSE, CAPILLARY: Glucose-Capillary: 116 mg/dL — ABNORMAL HIGH (ref 70–99)

## 2024-04-15 SURGERY — TUNNELLED CATHETER EXCHANGE
Anesthesia: LOCAL

## 2024-04-15 MED ORDER — HEPARIN SODIUM (PORCINE) 1000 UNIT/ML IJ SOLN
INTRAMUSCULAR | Status: AC
  Filled 2024-04-15: qty 10

## 2024-04-15 MED ORDER — HEPARIN (PORCINE) IN NACL 1000-0.9 UT/500ML-% IV SOLN
INTRAVENOUS | Status: DC | PRN
  Administered 2024-04-15: 500 mL

## 2024-04-15 MED ORDER — SANTYL 250 UNIT/GM EX OINT: 1.0000 | TOPICAL_OINTMENT | Freq: Every day | CUTANEOUS | 0 refills | Status: AC

## 2024-04-15 MED ORDER — LIDOCAINE HCL (PF) 1 % IJ SOLN
INTRAMUSCULAR | Status: AC
  Filled 2024-04-15: qty 30

## 2024-04-15 MED ORDER — LIDOCAINE HCL (PF) 1 % IJ SOLN
INTRAMUSCULAR | Status: DC | PRN
Start: 2024-04-15 — End: 2024-04-15
  Administered 2024-04-15 (×2): 5 mL

## 2024-04-15 MED ORDER — HEPARIN SODIUM (PORCINE) 1000 UNIT/ML IJ SOLN
INTRAMUSCULAR | Status: DC | PRN
  Administered 2024-04-15 (×2): 1600 [IU] via INTRAVENOUS

## 2024-04-15 SURGICAL SUPPLY — 4 items
CATH PALINDROME 19 SP (CATHETERS) IMPLANT
GLIDEWIRE ADV .035X180CM (WIRE) IMPLANT
MAT PREVALON FULL STRYKER (MISCELLANEOUS) IMPLANT
TRAY PV CATH (CUSTOM PROCEDURE TRAY) ×1 IMPLANT

## 2024-04-15 NOTE — Op Note (Signed)
    Patient name: [name] MRN: [id number] DOB: Feb 10, 1956 Sex: female  04/15/2024 Pre-operative Diagnosis: ESRD on HD, nonfunctioning TDC Post-operative diagnosis:  Same Surgeon:  Norman GORMAN Serve, MD Procedure Performed:  Tunneled dialysis catheter exchange under fluoroscopic guidance   Indications: [name] is a 68 year old female with ESRD on HD who is presenting to the HD access center with a nonfunctioning catheter.  She has not been seen or evaluated for upper extremity permanent access yet but has an appointment with our office in about 2 weeks.  Risks benefits of tunneled dialysis catheter exchange were reviewed and she elected to proceed.  Findings:  Right IJ TDC exchange with tip placed at atriocaval junction.   Procedure:  The patient was identified in the holding area and taken to the cath lab  The patient was then placed supine on the table and prepped and draped in the usual sterile fashion.  A time out was called.  The catheter was visualized under fluoroscopy and a glide advantage wire was placed through one of the ports and into the IVC. Using lidocaine  and blunt dissection the cuff was then freed from the surrounding tissue attachments. The catheter was then removed over the glide advantage wire leaving the wire in place in the IVC. The new 19 cm catheter was then tracked over this Glide vantage wire with its stylette in place. This was placed into the atriocaval junction. The stylette and wire were removed. Both ports aspirated and flushed with ease. They were heparin  locked and the catheter was fastened to the skin with a nylon suture.   Norman GORMAN Serve MD [facility] Office: [phone]

## 2024-04-15 NOTE — H&P (Signed)
 HD ACCESS CENTER H&P   Patient ID: ROZALIA DINO, female   DOB: September 26, 1955, 68 y.o.   MRN: [id number]  Subjective:     HPI NYLIAH NIERENBERG is a 68 y.o. female with ESRD presenting to the HD access center for intervention.  Past Medical History:  Diagnosis Date   Anemia    Arthritis    Atrial fibrillation (HCC)    CAD (coronary artery disease)    Chronic kidney disease, stage III (moderate) (HCC)    Constipation    Diabetes mellitus (HCC)    History of blood transfusion    HLD (hyperlipidemia)    Hypertension    Myocardial infarction [facility])    in April 2014   Family History  Problem Relation Age of Onset   Diabetes Mother        No history CAD   Colon cancer Mother 39       died at 39 from CRC   Hypertension Father        Also had CAD   Heart disease Father    Cervical cancer Sister    Diabetes Sister    Congestive Heart Failure Sister    Heart disease Maternal Grandmother    Diabetes Brother    Kidney disease Brother    Sickle cell anemia Son    Thyroid  disease Son    Breast cancer Neg Hx    BRCA 1/2 Neg Hx    Past Surgical History:  Procedure Laterality Date   ANKLE SURGERY Right    x 6 total   BREAST BIOPSY Left    15 or 20 yearsa ago she cant remember laterality    HARDWARE REMOVAL Right 02/13/2013   Procedure: HARDWARE REMOVAL;  Surgeon: Jerona LULLA Sage, MD;  Location: [facility] OR;  Service: Orthopedics;  Laterality: Right;  Right Ankle Removal Hardware, Debridement, Place Wound VAC   IR FLUORO GUIDE CV LINE LEFT  11/08/2023   IR FLUORO GUIDE CV LINE RIGHT  11/01/2023   IR FLUORO GUIDE CV LINE RIGHT  11/12/2023   IR US  GUIDE VASC ACCESS LEFT  11/08/2023   IR US  GUIDE VASC ACCESS RIGHT  11/01/2023   IR US  GUIDE VASC ACCESS RIGHT  11/12/2023   laser surgery for diabetic retinopathy Bilateral    LEFT HEART CATHETERIZATION WITH CORONARY ANGIOGRAM N/A 01/09/2013   Procedure: LEFT HEART CATHETERIZATION WITH CORONARY ANGIOGRAM;  Surgeon: Toribio JONELLE Fuel, MD;   Location: [facility] CATH LAB;  Service: Cardiovascular;  Laterality: N/A;   ORIF ANKLE FRACTURE Right 01/12/2013   Procedure: OPEN REDUCTION INTERNAL FIXATION (ORIF) ANKLE FRACTURE;  Surgeon: Jerona LULLA Sage, MD;  Location: [facility] OR;  Service: Orthopedics;  Laterality: Right;   PARTIAL COLECTOMY N/A 11/02/2023   Procedure: OPEN PARTIAL COLECTOMY WITH END COLOSTOMY;  Surgeon: Dasie Leonor LITTIE, MD;  Location: [facility] OR;  Service: General;  Laterality: N/A;   PARTIAL HYSTERECTOMY      Short Social History:  Social History   Tobacco Use   Smoking status: Never   Smokeless tobacco: Never  Substance Use Topics   Alcohol  use: No    Allergies  Allergen Reactions   Nsaids Other (See Comments)    CKD stage 4   Lisinopril Other (See Comments)    coughing   Peanut-Containing Drug Products Itching and Other (See Comments)    GI intolerance - diarrhea    No current facility-administered medications for this encounter.    REVIEW OF SYSTEMS All other systems were reviewed and are negative  Objective:   Objective   Vitals:   04/15/24 0920 04/15/24 0931  BP: (!) 114/99 117/64  Pulse: 79 78  Resp: 12 14  Temp: 97.6 F (36.4 C)   TempSrc: Oral   SpO2: 100% 100%   There is no height or weight on file to calculate BMI.  Physical Exam General: no acute distress Cardiac: hemodynamically stable Extremities: no access created yet     Assessment/Plan:   ONIA SHIFLETT is a 68 y.o. female with ESRD presenting for tunneled dialysis catheter exchange.  Having issues with nonfunctioning catheter. Last HD session Monday. Reviewed risks and benefits of TDC exchange and patient agreed to proceed.   Norman Serve, MD [facility]

## 2024-04-16 ENCOUNTER — Ambulatory Visit: Admitting: Podiatry

## 2024-04-16 NOTE — Telephone Encounter (Signed)
 Dressing supplies faxed 04/16/2024 and also discussed with patient in regards to ointment needing prior approval if not covered provider will call her in something else.

## 2024-04-16 NOTE — Telephone Encounter (Signed)
 Thanks, Mikel

## 2024-04-16 NOTE — Telephone Encounter (Signed)
 Relayed message from provider in regards to pain she is experiencing has understanding.

## 2024-04-16 NOTE — Telephone Encounter (Signed)
 Patient has called follow up on ointment I explained previous message to her and is now requesting pain medication as well states her foot is in a lot of pain.

## 2024-04-19 ENCOUNTER — Emergency Department (HOSPITAL_COMMUNITY)

## 2024-04-19 ENCOUNTER — Inpatient Hospital Stay (HOSPITAL_COMMUNITY)

## 2024-04-19 ENCOUNTER — Inpatient Hospital Stay (HOSPITAL_COMMUNITY)
Admission: EM | Admit: 2024-04-19 | Discharge: 2024-04-28 | DRG: 463 | Disposition: A | Discharge status: Skilled Nursing Facility | Attending: Internal Medicine | Admitting: Internal Medicine

## 2024-04-19 DIAGNOSIS — L03115 Cellulitis of right lower limb: Secondary | ICD-10-CM | POA: Diagnosis present

## 2024-04-19 DIAGNOSIS — E785 Hyperlipidemia, unspecified: Secondary | ICD-10-CM | POA: Diagnosis present

## 2024-04-19 DIAGNOSIS — M869 Osteomyelitis, unspecified: Secondary | ICD-10-CM | POA: Insufficient documentation

## 2024-04-19 DIAGNOSIS — Z91010 Allergy to peanuts: Secondary | ICD-10-CM

## 2024-04-19 DIAGNOSIS — Z933 Colostomy status: Secondary | ICD-10-CM

## 2024-04-19 DIAGNOSIS — A419 Sepsis, unspecified organism: Principal | ICD-10-CM | POA: Diagnosis present

## 2024-04-19 DIAGNOSIS — I252 Old myocardial infarction: Secondary | ICD-10-CM

## 2024-04-19 DIAGNOSIS — I4891 Unspecified atrial fibrillation: Secondary | ICD-10-CM

## 2024-04-19 DIAGNOSIS — Z66 Do not resuscitate: Secondary | ICD-10-CM | POA: Diagnosis present

## 2024-04-19 DIAGNOSIS — R6521 Severe sepsis with septic shock: Secondary | ICD-10-CM

## 2024-04-19 DIAGNOSIS — M545 Low back pain, unspecified: Secondary | ICD-10-CM | POA: Diagnosis not present

## 2024-04-19 DIAGNOSIS — I482 Chronic atrial fibrillation, unspecified: Secondary | ICD-10-CM

## 2024-04-19 DIAGNOSIS — M726 Necrotizing fasciitis: Secondary | ICD-10-CM | POA: Diagnosis present

## 2024-04-19 DIAGNOSIS — N186 End stage renal disease: Secondary | ICD-10-CM

## 2024-04-19 DIAGNOSIS — E1165 Type 2 diabetes mellitus with hyperglycemia: Secondary | ICD-10-CM | POA: Diagnosis present

## 2024-04-19 DIAGNOSIS — E119 Type 2 diabetes mellitus without complications: Secondary | ICD-10-CM

## 2024-04-19 DIAGNOSIS — N2581 Secondary hyperparathyroidism of renal origin: Secondary | ICD-10-CM | POA: Diagnosis present

## 2024-04-19 DIAGNOSIS — S91001A Unspecified open wound, right ankle, initial encounter: Secondary | ICD-10-CM | POA: Diagnosis not present

## 2024-04-19 DIAGNOSIS — Z8614 Personal history of Methicillin resistant Staphylococcus aureus infection: Secondary | ICD-10-CM

## 2024-04-19 DIAGNOSIS — M86071 Acute hematogenous osteomyelitis, right ankle and foot: Secondary | ICD-10-CM | POA: Diagnosis not present

## 2024-04-19 DIAGNOSIS — B951 Streptococcus, group B, as the cause of diseases classified elsewhere: Secondary | ICD-10-CM | POA: Diagnosis present

## 2024-04-19 DIAGNOSIS — B9562 Methicillin resistant Staphylococcus aureus infection as the cause of diseases classified elsewhere: Secondary | ICD-10-CM | POA: Diagnosis not present

## 2024-04-19 DIAGNOSIS — Y838 Other surgical procedures as the cause of abnormal reaction of the patient, or of later complication, without mention of misadventure at the time of the procedure: Secondary | ICD-10-CM | POA: Diagnosis present

## 2024-04-19 DIAGNOSIS — B9689 Other specified bacterial agents as the cause of diseases classified elsewhere: Secondary | ICD-10-CM | POA: Diagnosis not present

## 2024-04-19 DIAGNOSIS — R7881 Bacteremia: Secondary | ICD-10-CM | POA: Insufficient documentation

## 2024-04-19 DIAGNOSIS — R609 Edema, unspecified: Secondary | ICD-10-CM | POA: Diagnosis not present

## 2024-04-19 DIAGNOSIS — Z90711 Acquired absence of uterus with remaining cervical stump: Secondary | ICD-10-CM

## 2024-04-19 DIAGNOSIS — D696 Thrombocytopenia, unspecified: Secondary | ICD-10-CM | POA: Diagnosis present

## 2024-04-19 DIAGNOSIS — Z7901 Long term (current) use of anticoagulants: Secondary | ICD-10-CM

## 2024-04-19 DIAGNOSIS — Z833 Family history of diabetes mellitus: Secondary | ICD-10-CM

## 2024-04-19 DIAGNOSIS — I120 Hypertensive chronic kidney disease with stage 5 chronic kidney disease or end stage renal disease: Secondary | ICD-10-CM | POA: Diagnosis present

## 2024-04-19 DIAGNOSIS — Z8249 Family history of ischemic heart disease and other diseases of the circulatory system: Secondary | ICD-10-CM

## 2024-04-19 DIAGNOSIS — I70201 Unspecified atherosclerosis of native arteries of extremities, right leg: Secondary | ICD-10-CM | POA: Diagnosis not present

## 2024-04-19 DIAGNOSIS — Z794 Long term (current) use of insulin: Secondary | ICD-10-CM

## 2024-04-19 DIAGNOSIS — Z992 Dependence on renal dialysis: Secondary | ICD-10-CM

## 2024-04-19 DIAGNOSIS — Z1152 Encounter for screening for COVID-19: Secondary | ICD-10-CM

## 2024-04-19 DIAGNOSIS — M549 Dorsalgia, unspecified: Secondary | ICD-10-CM | POA: Diagnosis not present

## 2024-04-19 DIAGNOSIS — D631 Anemia in chronic kidney disease: Secondary | ICD-10-CM | POA: Diagnosis present

## 2024-04-19 DIAGNOSIS — E1122 Type 2 diabetes mellitus with diabetic chronic kidney disease: Secondary | ICD-10-CM | POA: Diagnosis present

## 2024-04-19 DIAGNOSIS — Z888 Allergy status to other drugs, medicaments and biological substances status: Secondary | ICD-10-CM

## 2024-04-19 DIAGNOSIS — M86271 Subacute osteomyelitis, right ankle and foot: Secondary | ICD-10-CM | POA: Diagnosis present

## 2024-04-19 DIAGNOSIS — G8929 Other chronic pain: Secondary | ICD-10-CM | POA: Diagnosis present

## 2024-04-19 DIAGNOSIS — M25511 Pain in right shoulder: Secondary | ICD-10-CM | POA: Diagnosis not present

## 2024-04-19 DIAGNOSIS — T84624A Infection and inflammatory reaction due to internal fixation device of right fibula, initial encounter: Secondary | ICD-10-CM | POA: Diagnosis present

## 2024-04-19 DIAGNOSIS — A4102 Sepsis due to Methicillin resistant Staphylococcus aureus: Secondary | ICD-10-CM | POA: Diagnosis present

## 2024-04-19 DIAGNOSIS — E11649 Type 2 diabetes mellitus with hypoglycemia without coma: Secondary | ICD-10-CM | POA: Diagnosis not present

## 2024-04-19 DIAGNOSIS — M009 Pyogenic arthritis, unspecified: Secondary | ICD-10-CM | POA: Diagnosis present

## 2024-04-19 DIAGNOSIS — I82C11 Acute embolism and thrombosis of right internal jugular vein: Secondary | ICD-10-CM | POA: Diagnosis present

## 2024-04-19 DIAGNOSIS — I7090 Unspecified atherosclerosis: Secondary | ICD-10-CM | POA: Diagnosis not present

## 2024-04-19 DIAGNOSIS — E1169 Type 2 diabetes mellitus with other specified complication: Secondary | ICD-10-CM | POA: Diagnosis present

## 2024-04-19 DIAGNOSIS — I480 Paroxysmal atrial fibrillation: Secondary | ICD-10-CM | POA: Diagnosis present

## 2024-04-19 DIAGNOSIS — Z886 Allergy status to analgesic agent status: Secondary | ICD-10-CM

## 2024-04-19 DIAGNOSIS — M7989 Other specified soft tissue disorders: Secondary | ICD-10-CM | POA: Diagnosis not present

## 2024-04-19 DIAGNOSIS — Z841 Family history of disorders of kidney and ureter: Secondary | ICD-10-CM

## 2024-04-19 DIAGNOSIS — M86169 Other acute osteomyelitis, unspecified tibia and fibula: Secondary | ICD-10-CM | POA: Diagnosis not present

## 2024-04-19 DIAGNOSIS — I2510 Atherosclerotic heart disease of native coronary artery without angina pectoris: Secondary | ICD-10-CM | POA: Diagnosis present

## 2024-04-19 DIAGNOSIS — Z604 Social exclusion and rejection: Secondary | ICD-10-CM | POA: Diagnosis present

## 2024-04-19 DIAGNOSIS — Z635 Disruption of family by separation and divorce: Secondary | ICD-10-CM

## 2024-04-19 DIAGNOSIS — J449 Chronic obstructive pulmonary disease, unspecified: Secondary | ICD-10-CM | POA: Diagnosis not present

## 2024-04-19 DIAGNOSIS — Z79899 Other long term (current) drug therapy: Secondary | ICD-10-CM

## 2024-04-19 HISTORY — DX: Sepsis, unspecified organism: A41.9

## 2024-04-19 LAB — BASIC METABOLIC PANEL WITH GFR
Anion gap: 19 — ABNORMAL HIGH (ref 5–15)
BUN: 51 mg/dL — ABNORMAL HIGH (ref 8–23)
CO2: 17 mmol/L — ABNORMAL LOW (ref 22–32)
Calcium: 7.9 mg/dL — ABNORMAL LOW (ref 8.9–10.3)
Chloride: 94 mmol/L — ABNORMAL LOW (ref 98–111)
Creatinine, Ser: 6.96 mg/dL — ABNORMAL HIGH (ref 0.44–1.00)
GFR, Estimated: 6 mL/min — ABNORMAL LOW (ref >60)
Glucose, Bld: 436 mg/dL — ABNORMAL HIGH (ref 70–99)
Potassium: 3.8 mmol/L (ref 3.5–5.1)
Sodium: 130 mmol/L — ABNORMAL LOW (ref 135–145)

## 2024-04-19 LAB — GLUCOSE, CAPILLARY
Glucose-Capillary: 436 mg/dL — ABNORMAL HIGH (ref 70–99)
Glucose-Capillary: 396 mg/dL — ABNORMAL HIGH (ref 70–99)

## 2024-04-19 LAB — CBC WITH DIFFERENTIAL/PLATELET
Abs Immature Granulocytes: 0 K/uL (ref 0.00–0.07)
Basophils Absolute: 0 K/uL (ref 0.0–0.1)
Basophils Relative: 0 %
Eosinophils Absolute: 0 K/uL (ref 0.0–0.5)
Eosinophils Relative: 0 %
HCT: 29.2 % — ABNORMAL LOW (ref 36.0–46.0)
Hemoglobin: 9.4 g/dL — ABNORMAL LOW (ref 12.0–15.0)
Lymphocytes Relative: 3 %
Lymphs Abs: 0.9 K/uL (ref 0.7–4.0)
MCH: 26.6 pg (ref 26.0–34.0)
MCHC: 32.2 g/dL (ref 30.0–36.0)
MCV: 82.7 fL (ref 80.0–100.0)
Monocytes Absolute: 0.3 K/uL (ref 0.1–1.0)
Monocytes Relative: 1 %
Neutro Abs: 27.4 K/uL — ABNORMAL HIGH (ref 1.7–7.7)
Neutrophils Relative %: 96 %
Platelets: 225 K/uL (ref 150–400)
RBC: 3.53 MIL/uL — ABNORMAL LOW (ref 3.87–5.11)
RDW: 19.4 % — ABNORMAL HIGH (ref 11.5–15.5)
WBC: 28.5 K/uL — ABNORMAL HIGH (ref 4.0–10.5)
nRBC: 0 % (ref 0.0–0.2)
nRBC: 0 /100{WBCs}

## 2024-04-19 LAB — I-STAT CG4 LACTIC ACID, ED: Lactic Acid, Venous: 2.6 mmol/L (ref 0.5–1.9)

## 2024-04-19 LAB — PROTIME-INR
INR: 1.1 (ref 0.8–1.2)
Prothrombin Time: 14.6 s (ref 11.4–15.2)

## 2024-04-19 LAB — COMPREHENSIVE METABOLIC PANEL WITH GFR
ALT: 12 U/L (ref 0–44)
AST: 13 U/L — ABNORMAL LOW (ref 15–41)
Albumin: 1.5 g/dL — ABNORMAL LOW (ref 3.5–5.0)
Alkaline Phosphatase: 106 U/L (ref 38–126)
Anion gap: 19 — ABNORMAL HIGH (ref 5–15)
BUN: 54 mg/dL — ABNORMAL HIGH (ref 8–23)
CO2: 17 mmol/L — ABNORMAL LOW (ref 22–32)
Calcium: 8 mg/dL — ABNORMAL LOW (ref 8.9–10.3)
Chloride: 92 mmol/L — ABNORMAL LOW (ref 98–111)
Creatinine, Ser: 7.17 mg/dL — ABNORMAL HIGH (ref 0.44–1.00)
GFR, Estimated: 6 mL/min — ABNORMAL LOW (ref >60)
Glucose, Bld: 391 mg/dL — ABNORMAL HIGH (ref 70–99)
Potassium: 3.9 mmol/L (ref 3.5–5.1)
Sodium: 128 mmol/L — ABNORMAL LOW (ref 135–145)
Total Bilirubin: 2 mg/dL — ABNORMAL HIGH (ref 0.0–1.2)
Total Protein: 5.4 g/dL — ABNORMAL LOW (ref 6.5–8.1)

## 2024-04-19 LAB — RESP PANEL BY RT-PCR (RSV, FLU A&B, COVID)  RVPGX2
Influenza A by PCR: NEGATIVE
Influenza B by PCR: NEGATIVE
Resp Syncytial Virus by PCR: NEGATIVE
SARS Coronavirus 2 by RT PCR: NEGATIVE

## 2024-04-19 LAB — CBG MONITORING, ED: Glucose-Capillary: 381 mg/dL — ABNORMAL HIGH (ref 70–99)

## 2024-04-19 LAB — MRSA NEXT GEN BY PCR, NASAL: MRSA by PCR Next Gen: NOT DETECTED

## 2024-04-19 LAB — LACTIC ACID, PLASMA: Lactic Acid, Venous: 1.5 mmol/L (ref 0.5–1.9)

## 2024-04-19 LAB — C-REACTIVE PROTEIN: CRP: 21.9 mg/dL — ABNORMAL HIGH (ref <1.0)

## 2024-04-19 MED ORDER — INSULIN GLARGINE-YFGN 100 UNIT/ML ~~LOC~~ SOLN
10.0000 [IU] | Freq: Every day | SUBCUTANEOUS | Status: DC
  Administered 2024-04-20: 10 [IU] via SUBCUTANEOUS
  Filled 2024-04-19 (×2): qty 0.1

## 2024-04-19 MED ORDER — DOCUSATE SODIUM 100 MG PO CAPS: 100.0000 mg | ORAL_CAPSULE | Freq: Two times a day (BID) | ORAL | Status: DC | PRN

## 2024-04-19 MED ORDER — SODIUM CHLORIDE 0.9 % IV SOLN
2.0000 g | Freq: Once | INTRAVENOUS | Status: AC
  Administered 2024-04-19: 2 g via INTRAVENOUS
  Filled 2024-04-19: qty 12.5

## 2024-04-19 MED ORDER — VANCOMYCIN HCL 750 MG/150ML IV SOLN
750.0000 mg | INTRAVENOUS | Status: DC
  Filled 2024-04-19: qty 150

## 2024-04-19 MED ORDER — SODIUM CHLORIDE 0.9 % IV BOLUS
250.0000 mL | Freq: Once | INTRAVENOUS | Status: AC
  Administered 2024-04-19: 250 mL via INTRAVENOUS

## 2024-04-19 MED ORDER — CLINDAMYCIN PHOSPHATE 900 MG/50ML IV SOLN
900.0000 mg | Freq: Once | INTRAVENOUS | Status: AC
  Administered 2024-04-19: 900 mg via INTRAVENOUS
  Filled 2024-04-19: qty 50

## 2024-04-19 MED ORDER — POLYETHYLENE GLYCOL 3350 17 G PO PACK: 17.0000 g | PACK | Freq: Every day | ORAL | Status: DC | PRN

## 2024-04-19 MED ORDER — ACETAMINOPHEN 325 MG PO TABS
650.0000 mg | ORAL_TABLET | Freq: Four times a day (QID) | ORAL | Status: DC | PRN
  Administered 2024-04-19: 650 mg via ORAL
  Filled 2024-04-19: qty 2

## 2024-04-19 MED ORDER — NOREPINEPHRINE 4 MG/250ML-% IV SOLN
0.0000 ug/min | INTRAVENOUS | Status: DC
  Administered 2024-04-19: 5 ug/min via INTRAVENOUS
  Administered 2024-04-20: 3 ug/min via INTRAVENOUS
  Administered 2024-04-20 (×2): 13 ug/min via INTRAVENOUS
  Administered 2024-04-21: 3 ug/min via INTRAVENOUS
  Administered 2024-04-21: 6 ug/min via INTRAVENOUS
  Filled 2024-04-19 (×5): qty 250

## 2024-04-19 MED ORDER — SODIUM CHLORIDE 0.9 % IV SOLN
1.0000 g | INTRAVENOUS | Status: DC
  Filled 2024-04-19: qty 10

## 2024-04-19 MED ORDER — LACTATED RINGERS IV BOLUS (SEPSIS): 1000.0000 mL | Freq: Once | INTRAVENOUS | Status: DC

## 2024-04-19 MED ORDER — CLINDAMYCIN PHOSPHATE 900 MG/50ML IV SOLN
900.0000 mg | Freq: Three times a day (TID) | INTRAVENOUS | Status: DC
  Administered 2024-04-20: 900 mg via INTRAVENOUS
  Filled 2024-04-19 (×2): qty 50

## 2024-04-19 MED ORDER — IOHEXOL 350 MG/ML SOLN
75.0000 mL | Freq: Once | INTRAVENOUS | Status: AC | PRN
  Administered 2024-04-19: 75 mL via INTRAVENOUS

## 2024-04-19 MED ORDER — SODIUM CHLORIDE 0.9 % IV BOLUS
1000.0000 mL | Freq: Once | INTRAVENOUS | Status: AC
  Administered 2024-04-19: 1000 mL via INTRAVENOUS

## 2024-04-19 MED ORDER — CHLORHEXIDINE GLUCONATE CLOTH 2 % EX PADS
6.0000 | MEDICATED_PAD | Freq: Every day | CUTANEOUS | Status: DC
  Administered 2024-04-19 – 2024-04-23 (×4): 6 via TOPICAL

## 2024-04-19 MED ORDER — INSULIN ASPART 100 UNIT/ML IJ SOLN
0.0000 [IU] | INTRAMUSCULAR | Status: DC
  Administered 2024-04-19: 6 [IU] via SUBCUTANEOUS
  Administered 2024-04-20 (×2): 5 [IU] via SUBCUTANEOUS

## 2024-04-19 MED ORDER — VANCOMYCIN HCL IN DEXTROSE 1-5 GM/200ML-% IV SOLN
1000.0000 mg | Freq: Once | INTRAVENOUS | Status: AC
  Administered 2024-04-19: 1000 mg via INTRAVENOUS
  Filled 2024-04-19: qty 200

## 2024-04-19 MED ORDER — MIDODRINE HCL 5 MG PO TABS
2.5000 mg | ORAL_TABLET | Freq: Three times a day (TID) | ORAL | Status: DC
  Administered 2024-04-19 – 2024-04-20 (×2): 2.5 mg via ORAL
  Filled 2024-04-19 (×2): qty 1

## 2024-04-19 MED ORDER — FENTANYL CITRATE PF 50 MCG/ML IJ SOSY
12.5000 ug | PREFILLED_SYRINGE | INTRAMUSCULAR | Status: DC | PRN
  Administered 2024-04-19 – 2024-04-20 (×2): 12.5 ug via INTRAVENOUS
  Filled 2024-04-19 (×2): qty 1

## 2024-04-19 MED ORDER — SODIUM CHLORIDE 0.9 % IV BOLUS: 250.0000 mL | Freq: Once | INTRAVENOUS | Status: DC

## 2024-04-19 MED ORDER — VANCOMYCIN HCL 500 MG/100ML IV SOLN
500.0000 mg | Freq: Once | INTRAVENOUS | Status: AC
  Administered 2024-04-19: 500 mg via INTRAVENOUS
  Filled 2024-04-19: qty 100

## 2024-04-19 MED ORDER — LACTATED RINGERS IV SOLN: INTRAVENOUS | Status: AC

## 2024-04-19 NOTE — ED Triage Notes (Signed)
 Pt BIB [facility] from home as a code sepsis. Pt has an open wound on her R foot. Recent placement of dialysis port. Pt has an ostomy bag.  Pt was hypotensive in the 70s w EMS. 250 ml fluids given by EMS. Pt is diabetic.   Bp 84/42 Ra 98%  Cbg 500

## 2024-04-19 NOTE — Progress Notes (Addendum)
 eLink Physician-Brief Progress Note Patient Name: [name] DOB: 15-May-1956 MRN: [id number]   Date of Service  04/19/2024  HPI/Events of Note  Patient now in ICU on 8 mic/min levophed  and on room air with no distress on camera. HR is 80. Talking with no issues. Has pain from her foot infection. Seen by ortho and vascular in ER. No plans for OR. On antibiotics and fluids. I see elevated LA this afternoon. Gluc was high on BMP and is 430 now. Has not had any insulin .   eICU Interventions  SSI ordered Recheck glucose in 2 hours If not improving then will use endotool Check BMP and LA now Low dose fentanyl  ordered as well Tylenol  does not work for her Dw RN      Intervention Category Major Interventions: Other: Evaluation Type: New Patient Evaluation  [name] 04/19/2024, 8:11 PM  10:45 pm - Takes Lantus  at home so will add home dose. LA is normal on repeat check. Headache so adding on some tylenol . Will increase fentanyl  if tylenol  ineffective since fentanyl  was only low dose. Continue Q4 SSI and if levels dont improve then will add endotool with next check.   6-05 am - glucose still in 300s. K is 3.3. For HD today likely. Move to higher dose SSI . Bicarb actually better than on last check.

## 2024-04-19 NOTE — Procedures (Signed)
 Central Venous Catheter Insertion Procedure Note  GENEAL HUEBERT  [id number]  [birth year]  Date:04/19/24  Time:6:16 PM   Provider Performing:Lucy Woolever V. Jude   Procedure: Insertion of Non-tunneled Central Venous Catheter(36556) with US  guidance (23062)   Indication(s) Difficult access  Consent Risks of the procedure as well as the alternatives and risks of each were explained to the patient and/or caregiver.  Consent for the procedure was obtained and is signed in the bedside chart  Anesthesia Topical only with 1% lidocaine    Timeout Verified patient identification, verified procedure, site/side was marked, verified correct patient position, special equipment/implants available, medications/allergies/relevant history reviewed, required imaging and test results available.  Sterile Technique Maximal sterile technique including full sterile barrier drape, hand hygiene, sterile gown, sterile gloves, mask, hair covering, sterile ultrasound probe cover (if used).  Procedure Description Area of catheter insertion was cleaned with chlorhexidine  and draped in sterile fashion.  With real-time ultrasound guidance a central venous catheter was placed into the left internal jugular vein. Nonpulsatile blood flow and easy flushing noted in all ports.  The catheter was sutured in place and sterile dressing applied.  Complications/Tolerance None; patient tolerated the procedure well. Chest X-ray is ordered to verify placement for internal jugular or subclavian cannulation.   Chest x-ray is not ordered for femoral cannulation.  EBL Minimal  Specimen(s) None  Daquarius Dubeau V. Jude MD

## 2024-04-19 NOTE — Sepsis Progress Note (Signed)
 Notified bedside nurse of need to draw repeat lactic acid.

## 2024-04-19 NOTE — Progress Notes (Signed)
 Pharmacy Antibiotic Note  [name] is a 68 y.o. female admitted on 04/19/2024 with weakness, wound infection and concern for sepsis.  Pharmacy has been consulted for vancomycin  and cefepime  dosing.  Plan: Vancomycin  1500 mg IV x 1, then 750 mg IV q HD Cefepime  1g IV q 24h Monitor HD schedule, Cx and clinical progression to narrow Vancomycin  random level as needed     Temp (24hrs), Avg:98.3 F (36.8 C), Min:98.3 F (36.8 C), Max:98.3 F (36.8 C)  Recent Labs  Lab 04/19/24 1341 04/19/24 1409  WBC 28.5*  --   CREATININE 7.17*  --   LATICACIDVEN  --  2.6*    CrCl cannot be calculated (Unknown ideal weight.).    Allergies  Allergen Reactions   Nsaids Other (See Comments)    CKD stage 4   Lisinopril Other (See Comments)    coughing   Peanut-Containing Drug Products Itching and Other (See Comments)    GI intolerance - diarrhea    Dorn Poot, PharmD, [facility] Clinical Pharmacist ED Pharmacist Phone # [phone] 04/19/2024 6:49 PM

## 2024-04-19 NOTE — ED Provider Notes (Signed)
 Pt signed out by Dr. Ula pending CCM call back.  I did speak with critical care and they will see pt.  They are attempting IV access.  Pt d/w Dr. IVAR Chancy (ortho) who will see pt.  He asks that I call vascular.    I spoke with Dr. Pearline (vascular) who will see pt.  Dr. Jude will admit.  CRITICAL CARE Performed by: Mliss Boyers   Total critical care time: 30 minutes  Critical care time was exclusive of separately billable procedures and treating other patients.  Critical care was necessary to treat or prevent imminent or life-threatening deterioration.  Critical care was time spent personally by me on the following activities: development of treatment plan with patient and/or surrogate as well as nursing, discussions with consultants, evaluation of patient's response to treatment, examination of patient, obtaining history from patient or surrogate, ordering and performing treatments and interventions, ordering and review of laboratory studies, ordering and review of radiographic studies, pulse oximetry and re-evaluation of patient's condition.    Boyers Mliss, MD 04/19/24 [phone]

## 2024-04-19 NOTE — Consult Note (Signed)
 ORTHOPAEDIC CONSULTATION  REQUESTING PHYSICIAN: Jude Harden GAILS, MD   Chief Complaint: R leg infection  HPI: [name] is a 68 y.o. female who complains of chronic right leg wound.  She has a history of kidney failure.  She has had a chronic wound in the right leg she has been managed by podiatry recently she had an ORIF of her ankle roughly 10 years ago.  She denies any acute change in her systemic symptoms.  She denies any redness or acute pain around the ankle  Past Medical History:  Diagnosis Date   Anemia    Arthritis    Atrial fibrillation (HCC)    CAD (coronary artery disease)    Chronic kidney disease, stage III (moderate) (HCC)    Constipation    Diabetes mellitus (HCC)    History of blood transfusion    HLD (hyperlipidemia)    Hypertension    Myocardial infarction [facility])    in April 2014   Past Surgical History:  Procedure Laterality Date   ANKLE SURGERY Right    x 6 total   BREAST BIOPSY Left    15 or 20 yearsa ago she cant remember laterality    HARDWARE REMOVAL Right 02/13/2013   Procedure: HARDWARE REMOVAL;  Surgeon: Jerona GAILS Sage, MD;  Location: [facility] OR;  Service: Orthopedics;  Laterality: Right;  Right Ankle Removal Hardware, Debridement, Place Wound VAC   IR FLUORO GUIDE CV LINE LEFT  11/08/2023   IR FLUORO GUIDE CV LINE RIGHT  11/01/2023   IR FLUORO GUIDE CV LINE RIGHT  11/12/2023   IR US  GUIDE VASC ACCESS LEFT  11/08/2023   IR US  GUIDE VASC ACCESS RIGHT  11/01/2023   IR US  GUIDE VASC ACCESS RIGHT  11/12/2023   laser surgery for diabetic retinopathy Bilateral    LEFT HEART CATHETERIZATION WITH CORONARY ANGIOGRAM N/A 01/09/2013   Procedure: LEFT HEART CATHETERIZATION WITH CORONARY ANGIOGRAM;  Surgeon: Toribio JONELLE Fuel, MD;  [facility] CATH LAB;  Service: Cardiovascular;  Laterality: N/A;   ORIF ANKLE FRACTURE Right 01/12/2013   Procedure: OPEN REDUCTION INTERNAL FIXATION (ORIF) ANKLE FRACTURE;  Surgeon: Jerona GAILS Sage, MD;  Location: [facility] OR;   Service: Orthopedics;  Laterality: Right;   PARTIAL COLECTOMY N/A 11/02/2023   Procedure: OPEN PARTIAL COLECTOMY WITH END COLOSTOMY;  Surgeon: Dasie Leonor LITTIE, MD;  Location: [facility] OR;  Service: General;  Laterality: N/A;   PARTIAL HYSTERECTOMY     TUNNELLED CATHETER EXCHANGE N/A 04/15/2024   Procedure: TUNNELLED CATHETER EXCHANGE;  Surgeon: Pearline Norman RAMAN, MD;  Location: [facility] LAB;  Service: Cardiovascular;  Laterality: N/A;   Social History   Socioeconomic History   Marital status: Legally Separated    Spouse name: Not on file   Number of children: 4   Years of education: Not on file   Highest education level: Not on file  Occupational History   Occupation: Disabled   Occupation: disabled  Tobacco Use   Smoking status: Never   Smokeless tobacco: Never  Vaping Use   Vaping status: Never Used  Substance and Sexual Activity   Alcohol  use: No   Drug use: No   Sexual activity: Not Currently  Other Topics Concern   Not on file  Social History Narrative   Patient lives with her son..   Social Drivers of Health   Financial Resource Strain: Low Risk  (08/25/2021)   Received from [facility]   Overall Financial Resource Strain (CARDIA)    Difficulty of  Paying Living Expenses: Not hard at all  Food Insecurity: No Food Insecurity (10/28/2023)   Hunger Vital Sign    Worried About Running Out of Food in the Last Year: Never true    Ran Out of Food in the Last Year: Never true  Transportation Needs: No Transportation Needs (10/28/2023)   PRAPARE - Administrator, Civil Service (Medical): No    Lack of Transportation (Non-Medical): No  Physical Activity: Insufficiently Active (06/20/2021)   Received [facility]   Exercise Vital Sign    On average, how many days per week do you engage in moderate to strenuous exercise (like a brisk walk)?: 1 day    On average, how many minutes do you engage in exercise at this level?: 20 min  Stress: No Stress Concern Present  (08/25/2021)   Received from [facility] - Occupational Stress Questionnaire    Feeling of Stress : Not at all  Social Connections: Socially Isolated (10/28/2023)   Social Connection and Isolation Panel    Frequency of Communication with Friends and Family: Twice a week    Frequency of Social Gatherings with Friends and Family: Never    Attends Religious Services: Never    Database administrator or Organizations: No    Attends Engineer, structural: Never    Marital Status: Separated   Family History  Problem Relation Age of Onset   Diabetes Mother        No history CAD   Colon cancer Mother 72       died at 20 from CRC   Hypertension Father        Also had CAD   Heart disease Father    Cervical cancer Sister    Diabetes Sister    Congestive Heart Failure Sister    Heart disease Maternal Grandmother    Diabetes Brother    Kidney disease Brother    Sickle cell anemia Son    Thyroid  disease Son    Breast cancer Neg Hx    BRCA 1/2 Neg Hx    Allergies  Allergen Reactions   Nsaids Other (See Comments)    CKD stage 4   Lisinopril Other (See Comments)    coughing   Peanut-Containing Drug Products Itching and Other (See Comments)    GI intolerance - diarrhea   Prior to Admission medications   Medication Sig Start Date End Date Taking? Authorizing Provider  albuterol  (PROVENTIL ) (2.5 MG/3ML) 0.083% nebulizer solution Take 3 mLs (2.5 mg total) by nebulization every 2 (two) hours as needed for wheezing. 11/25/23  Yes Ghimire, Donalda HERO, MD  apixaban  (ELIQUIS ) 2.5 MG TABS tablet Take 1 tablet (2.5 mg total) by mouth 2 (two) times daily. 10/04/22  Yes Acharya, Gayatri A, MD  brimonidine  (ALPHAGAN ) 0.2 % ophthalmic solution Place 1 drop into both eyes 2 (two) times daily.   Yes [provider]  calcitRIOL  (ROCALTROL ) 0.25 MCG capsule TAKE 1 CAPSULE (0.25 MCG TOTAL) EVERY MONDAY, WEDNESDAY, AND FRIDAY Patient taking differently:  Take 0.25 mcg by mouth in the morning and at bedtime. 06/22/22  Yes Christia Budds, MD  carvedilol  (COREG ) 12.5 MG tablet TAKE 1 TABLET (12.5 MG TOTAL) BY MOUTH 2 (TWO) TIMES DAILY WITH A MEAL. 12/22/21  Yes Autry-Lott, Rojean, DO  insulin  glargine (LANTUS ) 100 UNIT/ML injection Inject 0.07 mLs (7 Units total) into the skin daily. Please schedule appointment before next refill. Patient taking differently: Inject 10 Units into  the skin daily. Please schedule appointment before next refill. 02/01/24  Yes [name], MD  latanoprost  (XALATAN ) 0.005 % ophthalmic solution Place 1 drop into both eyes at bedtime.   Yes [provider]  melatonin 5 MG TABS Take 10 mg by mouth at bedtime as needed (for insomnia).   Yes [provider]  midodrine  (PROAMATINE ) 2.5 MG tablet Take 2.5 mg by mouth.   Yes [provider]  Multiple Vitamin (MULTIVITAMIN WITH MINERALS) TABS Take 1 tablet by mouth daily.   Yes [provider]  NOVOLIN R 100 UNIT/ML injection Inject 0-9 Units into the skin in the morning, at noon, and at bedtime. Per sliding scale 04/03/24  Yes [provider]  ACCU-CHEK AVIVA PLUS test strip CHECK BLOOD GLUCOSE THREE TIMES DAILY 11/05/22   Joshua Domino, DO  amLODipine  (NORVASC ) 5 MG tablet Take 0.5 tablets (2.5 mg total) by mouth at bedtime. Patient not taking: Reported on 03/26/2024 02/01/24 03/26/24  [name], MD  Blood Glucose Monitoring Suppl (ACCU-CHEK AVIVA PLUS) w/Device KIT CHECK BLOOD GLUCOSE THREE TIMES DAILY 10/26/20   Autry-Lott, Rojean, DO  collagenase  (SANTYL ) 250 UNIT/GM ointment Apply 1 Application topically daily. Apply nickel thick amount to ulcer Right lateral ankle 3 x 2 cm with muscle/fascia capsule exposed Patient not taking: Reported on 04/19/2024 04/15/24 05/15/24  Lamount Ethan CROME, DPM  DROPLET INSULIN  SYRINGE 31G X 5/16 0.5 ML MISC USE FOUR TIMES DAILY FOR INJECTIONS 09/03/22   Joshua Domino, DO  furosemide  (LASIX ) 40 MG tablet  Take 40 mg by mouth 2 (two) times daily. Patient not taking: Reported on 04/19/2024    [provider]  insulin  aspart (NOVOLOG  FLEXPEN) 100 UNIT/ML FlexPen 0-9 Units, Subcutaneous, 3 times daily with meals CBG < 70: Implement Hypoglycemia measures CBG 70 - 120: 0 units CBG 121 - 150: 1 unit CBG 151 - 200: 2 units CBG 201 - 250: 3 units CBG 251 - 300: 5 units CBG 301 - 350: 7 units CBG 351 - 400: 9 units CBG > 400: call MD Patient not taking: Reported on 04/19/2024 11/25/23   Raenelle Donalda HERO, MD  Insulin  Syringes, Disposable, U-100 0.5 ML MISC 4x daily injections 03/21/18   Riccio, Angela C, DO  Lancet Devices (ACCU-CHEK SOFTCLIX) lancets Fill for 1 month for TID testing. Use as instructed 11/13/13   Curtis Hadassah DASEN, MD  NEEDLE, DISP, 30 G (BD DISP NEEDLES) 30G X 1/2 MISC For 4x daily injections 11/05/15   Phelps, Jazma Y, DO  REPATHA SURECLICK 140 MG/ML SOAJ Inject 140 mg into the skin every 14 (fourteen) days. Patient not taking: Reported on 03/26/2024 09/16/23   [provider]  VITAMIN D , CHOLECALCIFEROL, PO Take 1 tablet by mouth daily. Patient not taking: Reported on 04/19/2024    [provider]   DG Ankle Complete Right Result Date: 04/19/2024 CLINICAL DATA:  Evaluate for osteomyelitis. EXAM: RIGHT ANKLE - COMPLETE 3+ VIEW COMPARISON:  Right ankle radiograph dated 03/05/2024 FINDINGS: Distal tibia and fibular fixation. Fragmentation of the tip of the distal fibula new since the prior radiograph and suspicious for osteomyelitis. There is lucency adjacent to the lowermost fibular screw likely representing loosening. Degenerative changes and chronic deformity and fragmentation of the ankle. There is soft tissue air medial to the ankle as well as large amount of soft tissue gas in the distal calf posteriorly. In the absence of recent procedure findings concerning for an infectious process and possibly necrotizing fasciitis. Clinical correlation recommended. Dressing noted  over the lateral ankle. IMPRESSION: 1. Fragmentation of the tip of the distal fibula new since the prior radiograph and suspicious for osteomyelitis. 2. Soft tissue air medial to the ankle as well as large amount of soft tissue gas in the distal calf posteriorly. In the absence of recent procedure findings concerning for an infectious process and possibly necrotizing fasciitis. Electronically Signed   By: Vanetta Chou M.D.   On: 04/19/2024 15:11   DG Chest Port 1 View Result Date: 04/19/2024 CLINICAL DATA:  Questionable sepsis. EXAM: PORTABLE CHEST 1 VIEW COMPARISON:  Chest CT dated 11/03/2023 FINDINGS: Dialysis catheter with tip at the cavoatrial junction. No focal consolidation, pleural effusion, or pneumothorax. Top-normal cardiac size. No acute osseous pathology. IMPRESSION: No active disease. Electronically Signed   By: Vanetta Chou M.D.   On: 04/19/2024 14:16    Positive ROS: All other systems have been reviewed and were otherwise negative with the exception of those mentioned in the HPI and as above.  Labs cbc Recent Labs    04/19/24 1341  WBC 28.5*  HGB 9.4*  HCT 29.2*  PLT 225    Labs inflam No results for input(s): CRP in the last 72 hours.  Invalid input(s): ESR  Labs coag Recent Labs    04/19/24 1341  INR 1.1    Recent Labs    04/19/24 1341  NA 128*  K 3.9  CL 92*  CO2 17*  GLUCOSE 391*  BUN 54*  CREATININE 7.17*  CALCIUM  8.0*    Physical Exam: Vitals:   04/19/24 1712 04/19/24 1731  BP: (!) 78/44   Pulse: 84   Resp: (!) 25   Temp:  98.3 F (36.8 C)  SpO2: 100%    General: Alert, no acute distress Cardiovascular: No pedal edema Respiratory: No cyanosis, no use of accessory musculature GI: No organomegaly, abdomen is soft and non-tender Skin: No lesions in the area of chief complaint other than those listed below in MSK exam.  Neurologic: Sensation intact distally save for the below mentioned MSK exam Psychiatric: Patient is competent  for consent with normal mood and affect Lymphatic: No axillary or cervical lymphadenopathy  MUSCULOSKELETAL:  The right lower extremity she has a chronic wound with exposed lateral malleolus as well as exposed hardware.  There is no erythema around this there is no crepitus.  No acute tenderness Other extremities are atraumatic with painless ROM and NVI.  Assessment: Chronic right leg infection no evidence of acute necrotizing wound or rapid spread  Plan: Recommend admission IV antibiotics dialysis monitoring Dr. Harden is planning to consult on her tomorrow  No indication for emergent surgical intervention   Connie JONETTA Chancy, MD    04/19/2024 6:19 PM

## 2024-04-19 NOTE — Consult Note (Signed)
 Hospital Consult    Reason for Consult:  ankle wound  MRN #:  [id number]  History of Present Illness: This is a 68 y.o. female with a history of ESRD and a chronic right ankle wound.  She presented to the emergency department today for generalized weakness and chills.  The wound has been there for some time and has recently been getting worse and she has been referred to wound care.  Back in 2014 she had a fibular fracture with a dislocated ankle that was repaired with an ORIF that.  This subsequently got infected and debrided with removal of hardware about a month later.  She has not had any previous peripheral vascular interventions.  She does not smoke.  Past Medical History:  Diagnosis Date   Anemia    Arthritis    Atrial fibrillation (HCC)    CAD (coronary artery disease)    Chronic kidney disease, stage III (moderate) (HCC)    Constipation    Diabetes mellitus (HCC)    History of blood transfusion    HLD (hyperlipidemia)    Hypertension    Myocardial infarction [facility])    in April 2014    Past Surgical History:  Procedure Laterality Date   ANKLE SURGERY Right    x 6 total   BREAST BIOPSY Left    15 or 20 yearsa ago she cant remember laterality    HARDWARE REMOVAL Right 02/13/2013   Procedure: HARDWARE REMOVAL;  Surgeon: Jerona LULLA Sage, MD;  Location: [facility] OR;  Service: Orthopedics;  Laterality: Right;  Right Ankle Removal Hardware, Debridement, Place Wound VAC   IR FLUORO GUIDE CV LINE LEFT  11/08/2023   IR FLUORO GUIDE CV LINE RIGHT  11/01/2023   IR FLUORO GUIDE CV LINE RIGHT  11/12/2023   IR US  GUIDE VASC ACCESS LEFT  11/08/2023   IR US  GUIDE VASC ACCESS RIGHT  11/01/2023   IR US  GUIDE VASC ACCESS RIGHT  11/12/2023   laser surgery for diabetic retinopathy Bilateral    LEFT HEART CATHETERIZATION WITH CORONARY ANGIOGRAM N/A 01/09/2013   Procedure: LEFT HEART CATHETERIZATION WITH CORONARY ANGIOGRAM;  Surgeon: Toribio JONELLE Fuel, MD;  Location: [facility] CATH LAB;  Service: Cardiovascular;   Laterality: N/A;   ORIF ANKLE FRACTURE Right 01/12/2013   Procedure: OPEN REDUCTION INTERNAL FIXATION (ORIF) ANKLE FRACTURE;  Surgeon: Jerona LULLA Sage, MD;  Location: [facility] OR;  Service: Orthopedics;  Laterality: Right;   PARTIAL COLECTOMY N/A 11/02/2023   Procedure: OPEN PARTIAL COLECTOMY WITH END COLOSTOMY;  Surgeon: Dasie Leonor CROME, MD;  Location: [facility] OR;  Service: General;  Laterality: N/A;   PARTIAL HYSTERECTOMY     TUNNELLED CATHETER EXCHANGE N/A 04/15/2024   Procedure: TUNNELLED CATHETER EXCHANGE;  Surgeon: Pearline Norman RAMAN, MD;  Location: [facility];  Service: Cardiovascular;  Laterality: N/A;    Allergies  Allergen Reactions   Nsaids Other (See Comments)    CKD stage 4   Lisinopril Other (See Comments)    coughing   Peanut-Containing Drug Products Itching and Other (See Comments)    GI intolerance - diarrhea    Prior to Admission medications   Medication Sig Start Date End Date Taking? Authorizing Provider  albuterol  (PROVENTIL ) (2.5 MG/3ML) 0.083% nebulizer solution Take 3 mLs (2.5 mg total) by nebulization every 2 (two) hours as needed for wheezing. 11/25/23  Yes Ghimire, Donalda HERO, MD  apixaban  (ELIQUIS ) 2.5 MG TABS tablet Take 1 tablet (2.5 mg total) by mouth 2 (two) times daily. 10/04/22  Yes Acharya, Gayatri A,  MD  brimonidine  (ALPHAGAN ) 0.2 % ophthalmic solution Place 1 drop into both eyes 2 (two) times daily.   Yes [provider]  calcitRIOL  (ROCALTROL ) 0.25 MCG capsule TAKE 1 CAPSULE (0.25 MCG TOTAL) EVERY MONDAY, WEDNESDAY, AND FRIDAY Patient taking differently: Take 0.25 mcg by mouth in the morning and at bedtime. 06/22/22  Yes Christia Budds, MD  carvedilol  (COREG ) 12.5 MG tablet TAKE 1 TABLET (12.5 MG TOTAL) BY MOUTH 2 (TWO) TIMES DAILY WITH A MEAL. 12/22/21  Yes Autry-Lott, Rojean, DO  insulin  glargine (LANTUS ) 100 UNIT/ML injection Inject 0.07 mLs (7 Units total) into the skin daily. Please schedule appointment before next refill. Patient taking differently: Inject  10 Units into the skin daily. Please schedule appointment before next refill. 02/01/24  Yes [name], MD  latanoprost  (XALATAN ) 0.005 % ophthalmic solution Place 1 drop into both eyes at bedtime.   Yes [provider]  melatonin 5 MG TABS Take 10 mg by mouth at bedtime as needed (for insomnia).   Yes [provider]  midodrine  (PROAMATINE ) 2.5 MG tablet Take 2.5 mg by mouth.   Yes [provider]  Multiple Vitamin (MULTIVITAMIN WITH MINERALS) TABS Take 1 tablet by mouth daily.   Yes [provider]  NOVOLIN R 100 UNIT/ML injection Inject 0-9 Units into the skin in the morning, at noon, and at bedtime. Per sliding scale 04/03/24  Yes [provider]  ACCU-CHEK AVIVA PLUS test strip CHECK BLOOD GLUCOSE THREE TIMES DAILY 11/05/22   Joshua Domino, DO  amLODipine  (NORVASC ) 5 MG tablet Take 0.5 tablets (2.5 mg total) by mouth at bedtime. Patient not taking: Reported on 03/26/2024 02/01/24 03/26/24  [name], MD  Blood Glucose Monitoring Suppl (ACCU-CHEK AVIVA PLUS) w/Device KIT CHECK BLOOD GLUCOSE THREE TIMES DAILY 10/26/20   Autry-Lott, Rojean, DO  collagenase  (SANTYL ) 250 UNIT/GM ointment Apply 1 Application topically daily. Apply nickel thick amount to ulcer Right lateral ankle 3 x 2 cm with muscle/fascia capsule exposed Patient not taking: Reported on 04/19/2024 04/15/24 05/15/24  Lamount Ethan CROME, DPM  DROPLET INSULIN  SYRINGE 31G X 5/16 0.5 ML MISC USE FOUR TIMES DAILY FOR INJECTIONS 09/03/22   Joshua Domino, DO  furosemide  (LASIX ) 40 MG tablet Take 40 mg by mouth 2 (two) times daily. Patient not taking: Reported on 04/19/2024    [provider]  insulin  aspart (NOVOLOG  FLEXPEN) 100 UNIT/ML FlexPen 0-9 Units, Subcutaneous, 3 times daily with meals CBG < 70: Implement Hypoglycemia measures CBG 70 - 120: 0 units CBG 121 - 150: 1 unit CBG 151 - 200: 2 units CBG 201 - 250: 3 units CBG 251 - 300: 5 units CBG 301 - 350: 7 units CBG 351 - 400: 9 units  CBG > 400: call MD Patient not taking: Reported on 04/19/2024 11/25/23   Raenelle Donalda HERO, MD  Insulin  Syringes, Disposable, U-100 0.5 ML MISC 4x daily injections 03/21/18   Riccio, Angela C, DO  Lancet Devices (ACCU-CHEK SOFTCLIX) lancets Fill for 1 month for TID testing. Use as instructed 11/13/13   Curtis Hadassah DASEN, MD  NEEDLE, DISP, 30 G (BD DISP NEEDLES) 30G X 1/2 MISC For 4x daily injections 11/05/15   Phelps, Jazma Y, DO  REPATHA SURECLICK 140 MG/ML SOAJ Inject 140 mg into the skin every 14 (fourteen) days. Patient not taking: Reported on 03/26/2024 09/16/23   [provider]  VITAMIN D , CHOLECALCIFEROL, PO Take 1 tablet by mouth daily. Patient not taking: Reported on 04/19/2024    [provider]  Social History   Socioeconomic History   Marital status: Legally Separated    Spouse name: Not on file   Number of children: 4   Years of education: Not on file   Highest education level: Not on file  Occupational History   Occupation: Disabled   Occupation: disabled  Tobacco Use   Smoking status: Never   Smokeless tobacco: Never  Vaping Use   Vaping status: Never Used  Substance and Sexual Activity   Alcohol  use: No   Drug use: No   Sexual activity: Not Currently  Other Topics Concern   Not on file  Social History Narrative   Patient lives with her son..   Social Drivers of Health   Financial Resource Strain: Low Risk  (08/25/2021)   [facility]   Overall Financial Resource Strain (CARDIA)    Difficulty of Paying Living Expenses: Not hard at all  Food Insecurity: No Food Insecurity (10/28/2023)   Hunger Vital Sign    Worried About Running Out of Food in the Last Year: Never true    Ran Out of Food in the Last Year: Never true  Transportation Needs: No Transportation Needs (10/28/2023)   PRAPARE - Administrator, Civil Service (Medical): No    Lack of Transportation (Non-Medical): No  Physical Activity: Insufficiently Active  (06/20/2021)   Received from [facility]   Exercise Vital Sign    On average, how many days per week do you engage in moderate to strenuous exercise (like a brisk walk)?: 1 day    On average, how many minutes do you engage in exercise at this level?: 20 min  Stress: No Stress Concern Present (08/25/2021)   Received from [facility] of Occupational Health - Occupational Stress Questionnaire    Feeling of Stress : Not at all  Social Connections: Socially Isolated (10/28/2023)   Social Connection and Isolation Panel    Frequency of Communication with Friends and Family: Twice a week    Frequency of Social Gatherings with Friends and Family: Never    Attends Religious Services: Never    Database administrator or Organizations: No    Attends Banker Meetings: Never    Marital Status: Separated  Intimate Partner Violence: Not At Risk (10/28/2023)   Humiliation, Afraid, Rape, and Kick questionnaire    Fear of Current or Ex-Partner: No    Emotionally Abused: No    Physically Abused: No    Sexually Abused: No    Family History  Problem Relation Age of Onset   Diabetes Mother        No history CAD   Colon cancer Mother 59       died at 69 from CRC   Hypertension Father        Also had CAD   Heart disease Father    Cervical cancer Sister    Diabetes Sister    Congestive Heart Failure Sister    Heart disease Maternal Grandmother    Diabetes Brother    Kidney disease Brother    Sickle cell anemia Son    Thyroid  disease Son    Breast cancer Neg Hx    BRCA 1/2 Neg Hx     ROS: Otherwise negative unless mentioned in HPI  Physical Examination  Vitals:   04/19/24 1712 04/19/24 1731  BP: (!) 78/44   Pulse: 84   Resp: (!) 25   Temp:  98.3 F (36.8 C)  SpO2:  100%    There is no height or weight on file to calculate BMI.  General: no acute distress Cardiac: hemodynamically stable Pulm: normal work of breathing Abdomen: non-tender, no pulsatile  mass  Neuro: alert, no focal deficit Extremities: Necrotic right ankle wound with purulence and hardware visible  Vascular:   Right: palpable femoral, nonpalpable pedals  Left: palpable femoral, nonpalpable pedals   Data:   No previous arterial studies or CTAs.  X-ray demonstrates suspicion of osteo of the distal fibula at the ankle and the soft tissue gas.   ASSESSMENT/PLAN: This is a 68 y.o. female with sepsis likely from a chronic right ankle wound with exposed hardware and purulence.  She is being admitted to the ICU for septic shock.  She does have some element of PAD as she does not have palpable pedal pulses but there is no urgent revascularization indicated at this time.   It is likely that she will require an amputation given the appearance of the wound. Defer any bone or soft tissue debridement to orthopedic surgery   Norman GORMAN Serve MD [facility] [phone] 04/19/2024  6:02 PM

## 2024-04-19 NOTE — H&P (Addendum)
 NAME:  [name], MRN:  [id number], DOB:  June 15, 1956, LOS: 0 ADMISSION DATE:  04/19/2024, CONSULTATION DATE:  04/19/2024  REFERRING MD:  Dean, EDP, CHIEF COMPLAINT:  low BP   History of Present Illness:  68 year old woman with ESRD on HD, presented with generalized weakness .  Brought in as a code sepsis.  She had an open wound on the right ankle and a colostomy, she has a right IJ tunneled dialysis catheter that was recently exchanged on 7/23. Hypotensive on arrival to the 70s, received 1 L of fluid Labs significant for leukocytosis, lactate 2.6, hyperglycemia 391, stable hemoglobin 9.4, albumin  1.5. X-ray ankle showed distal tibia and fibula fixation with fragmentation of the tip of the distal fibula suspicious for osteomyelitis.  There was soft tissue air in the distal calf and medial ankle  Patient remained hypotensive and lost IV access. PCCM consulted   Pertinent  Medical History  ESRD on HD Diabetes type 2 Atrial fibrillation on apixaban   Significant Hospital Events: Including procedures, antibiotic start and stop dates in addition to other pertinent events     Interim History / Subjective:  Complains of weakness, thirst Afebrile Bradycardic 50s to 60s Hypotensive 70s  Objective    Blood pressure (!) 78/44, pulse 84, temperature 98.3 F (36.8 C), resp. rate (!) 25, SpO2 100%.        Intake/Output Summary (Last 24 hours) at 04/19/2024 1815 Last data filed at 04/19/2024 1414 Gross per 24 hour  Intake 250 ml  Output --  Net 250 ml   There were no vitals filed for this visit.  Examination: Gen. Pleasant, obese, in no distress, ENT - no pallor,icterus, right IJ tunneled catheter, dry mucosa Neck: [name], no thyromegaly, no carotid bruits Lungs: no use of accessory muscles, no dullness to percussion, decreased without rales or rhonchi  Cardiovascular: Rhythm irregular, heart sounds  normal, no murmurs or gallops, no peripheral edema Abdomen: soft and  non-tender, no hepatosplenomegaly, BS normal. 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  Blood pressures improved to 90s We will use Levophed  as needed  ESRD on HD -renal will be informed  Atrial fibrillation -hold apixaban  and Coreg  for now She is rate controlled   Diabetes type 2 -uncontrolled, SSI Semglee  10 units  Right ankle wound-POA see picture in media tab As described in exam Wound care per Ortho  CODE STATUS was discussed with the patient, she requested DNR/I status similar to her prior admission. This may be reversed if emergent surgery is required which may be lifesaving   Best Practice (right click and Reselect all SmartList Selections daily)   Diet/type: NPO w/ oral meds DVT prophylaxis DOAC Pressure ulcer(s): N/A GI prophylaxis: N/A Lines: Central line Foley:  N/A Code Status:  DNR Last date  of multidisciplinary goals of care discussion [as above]  Labs   CBC: Recent Labs  Lab 04/19/24 1341  WBC 28.5*  NEUTROABS 27.4*  HGB 9.4*  HCT 29.2*  MCV 82.7  PLT 225    Basic Metabolic Panel: Recent Labs  Lab 04/19/24 1341  NA 128*  K 3.9  CL 92*  CO2 17*  GLUCOSE 391*  BUN 54*  CREATININE 7.17*  CALCIUM  8.0*   GFR: CrCl  cannot be calculated (Unknown ideal weight.). Recent Labs  Lab 04/19/24 1341 04/19/24 1409  WBC 28.5*  --   LATICACIDVEN  --  2.6*    Liver Function Tests: Recent Labs  Lab 04/19/24 1341  AST 13*  ALT 12  ALKPHOS 106  BILITOT 2.0*  PROT 5.4*  ALBUMIN  1.5*   No results for input(s): LIPASE, AMYLASE in the last 168 hours. No results for input(s): AMMONIA in the last 168 hours.  ABG    Component Value Date/Time   HCO3 29.4 (H) 08/01/2010 0805   TCO2 21 01/08/2013 1242   ACIDBASEDEF 7.0 (H) 06/14/2009 1616   O2SAT 59.0 08/01/2010 0805     Coagulation Profile: Recent Labs  Lab 04/19/24 1341  INR 1.1    Cardiac Enzymes: No results for input(s): CKTOTAL, CKMB, CKMBINDEX, TROPONINI in the last 168 hours.  HbA1C: HbA1c, POC (controlled diabetic range)  Date/Time Value Ref Range Status  02/28/2021 02:50 PM 6.4 0.0 - 7.0 % Final  06/16/2020 01:52 PM 6.6 0.0 - 7.0 % Final   Hgb A1c MFr Bld  Date/Time Value Ref Range Status  10/28/2023 05:22 AM 6.6 (H) 4.8 - 5.6 % Final    Comment:    (NOTE) Pre diabetes:          5.7%-6.4%  Diabetes:              >6.4%  Glycemic control for   <7.0% adults with diabetes     CBG: Recent Labs  Lab 04/15/24 0924 04/19/24 1340  GLUCAP 116* 381*    Review of Systems:   Generalized weakness No fevers No burning micturition, diarrhea, no syncope No chest pain, shortness of breath  Past Medical History:  She,  has a past medical history of Anemia, Arthritis, Atrial fibrillation (HCC), CAD (coronary artery disease), Chronic kidney disease, stage III (moderate) (HCC), Constipation, Diabetes mellitus (HCC), History of blood transfusion, HLD (hyperlipidemia), Hypertension, and Myocardial infarction (HCC).   Surgical History:   Past Surgical History:  Procedure Laterality Date   ANKLE SURGERY Right    x 6 total   BREAST BIOPSY Left    15 or 20 yearsa ago she cant remember laterality    HARDWARE REMOVAL Right  02/13/2013   Procedure: HARDWARE REMOVAL;  Surgeon: Jerona LULLA Sage, MD;  Location: [facility] OR;  Service: Orthopedics;  Laterality: Right;  Right Ankle Removal Hardware, Debridement, Place Wound VAC   IR FLUORO GUIDE CV LINE LEFT  11/08/2023   IR FLUORO GUIDE CV LINE RIGHT  11/01/2023   IR FLUORO GUIDE CV LINE RIGHT  11/12/2023   IR US  GUIDE VASC ACCESS LEFT  11/08/2023   IR US  GUIDE VASC ACCESS RIGHT  11/01/2023   IR US  GUIDE VASC ACCESS RIGHT  11/12/2023   laser surgery for diabetic retinopathy Bilateral    LEFT HEART CATHETERIZATION WITH CORONARY ANGIOGRAM N/A 01/09/2013   Procedure: LEFT HEART CATHETERIZATION WITH CORONARY ANGIOGRAM;  Surgeon: Toribio JONELLE Fuel, MD;  Location: [facility] CATH LAB;  Service: Cardiovascular;  Laterality: N/A;   ORIF ANKLE FRACTURE  Right 01/12/2013   Procedure: OPEN REDUCTION INTERNAL FIXATION (ORIF) ANKLE FRACTURE;  Surgeon: Jerona LULLA Sage, MD;  Location: [facility] OR;  Service: Orthopedics;  Laterality: Right;   PARTIAL COLECTOMY N/A 11/02/2023   Procedure: OPEN PARTIAL COLECTOMY WITH END COLOSTOMY;  Surgeon: Dasie Leonor CROME, MD;  Location: [facility] OR;  Service: General;  Laterality: N/A;   PARTIAL HYSTERECTOMY     TUNNELLED CATHETER EXCHANGE N/A 04/15/2024   Procedure: TUNNELLED CATHETER EXCHANGE;  Surgeon: Pearline Norman RAMAN, MD;  Location: [facility] LAB;  Service: Cardiovascular;  Laterality: N/A;     Social History:   reports that she has never smoked. She has never used smokeless tobacco. She reports that she does not drink alcohol  and does not use drugs.   Family History:  Her family history includes Cervical cancer in her sister; Colon cancer (age of onset: 66) in her mother; Congestive Heart Failure in her sister; Diabetes in her brother, mother, and sister; Heart disease in her father and maternal grandmother; Hypertension in her father; Kidney disease in her brother; Sickle cell anemia in her son; Thyroid  disease in her son. There is no history of Breast cancer or BRCA 1/2.    Allergies Allergies  Allergen Reactions   Nsaids Other (See Comments)    CKD stage 4   Lisinopril Other (See Comments)    coughing   Peanut-Containing Drug Products Itching and Other (See Comments)    GI intolerance - diarrhea     Home Medications  Prior to Admission medications   Medication Sig Start Date End Date Taking? Authorizing Provider  albuterol  (PROVENTIL ) (2.5 MG/3ML) 0.083% nebulizer solution Take 3 mLs (2.5 mg total) by nebulization every 2 (two) hours as needed for wheezing. 11/25/23  Yes Ghimire, Donalda HERO, MD  apixaban  (ELIQUIS ) 2.5 MG TABS tablet Take 1 tablet (2.5 mg total) by mouth 2 (two) times daily. 10/04/22  Yes Acharya, Gayatri A, MD  brimonidine  (ALPHAGAN ) 0.2 % ophthalmic solution Place 1 drop into both eyes 2 (two) times daily.   Yes [provider]  calcitRIOL  (ROCALTROL ) 0.25 MCG capsule TAKE 1 CAPSULE (0.25 MCG TOTAL) EVERY MONDAY, WEDNESDAY, AND FRIDAY Patient taking differently: Take 0.25 mcg by mouth in the morning and at bedtime. 06/22/22  Yes Christia Budds, MD  carvedilol  (COREG ) 12.5 MG tablet TAKE 1 TABLET (12.5 MG TOTAL) BY MOUTH 2 (TWO) TIMES DAILY WITH A MEAL. 12/22/21  Yes Autry-Lott, Rojean, DO  insulin  glargine (LANTUS ) 100 UNIT/ML injection Inject 0.07 mLs (7 Units total) into the skin daily. Please schedule appointment before next refill. Patient taking differently: Inject 10 Units into the skin daily. Please schedule appointment before next refill. 02/01/24  Yes [name], MD  latanoprost  (XALATAN ) 0.005 % ophthalmic solution Place 1 drop into both eyes at bedtime.   Yes [provider]  melatonin 5 MG TABS Take 10 mg by mouth at bedtime as needed (for insomnia).   Yes [provider]  midodrine  (PROAMATINE ) 2.5 MG tablet Take 2.5 mg by mouth.   Yes [provider]  Multiple Vitamin (MULTIVITAMIN WITH MINERALS) TABS Take 1 tablet by mouth daily.   Yes [provider]  NOVOLIN R 100 UNIT/ML  injection Inject 0-9 Units into the skin in the morning, at noon, and at bedtime. Per sliding scale 04/03/24  Yes [provider]  ACCU-CHEK AVIVA PLUS test strip CHECK BLOOD GLUCOSE THREE TIMES DAILY 11/05/22   Joshua Domino, DO  amLODipine  (NORVASC ) 5 MG tablet Take 0.5 tablets (2.5 mg total)  by mouth at bedtime. Patient not taking: Reported on 03/26/2024 02/01/24 03/26/24  [name], MD  Blood Glucose Monitoring Suppl (ACCU-CHEK AVIVA PLUS) w/Device KIT CHECK BLOOD GLUCOSE THREE TIMES DAILY 10/26/20   Autry-Lott, Rojean, DO  collagenase  (SANTYL ) 250 UNIT/GM ointment Apply 1 Application topically daily. Apply nickel thick amount to ulcer Right lateral ankle 3 x 2 cm with muscle/fascia capsule exposed Patient not taking: Reported on 04/19/2024 04/15/24 05/15/24  Lamount Ethan CROME, DPM  DROPLET INSULIN  SYRINGE 31G X 5/16 0.5 ML MISC USE FOUR TIMES DAILY FOR INJECTIONS 09/03/22   Joshua Domino, DO  furosemide  (LASIX ) 40 MG tablet Take 40 mg by mouth 2 (two) times daily. Patient not taking: Reported on 04/19/2024    [provider]  insulin  aspart (NOVOLOG  FLEXPEN) 100 UNIT/ML FlexPen 0-9 Units, Subcutaneous, 3 times daily with meals CBG < 70: Implement Hypoglycemia measures CBG 70 - 120: 0 units CBG 121 - 150: 1 unit CBG 151 - 200: 2 units CBG 201 - 250: 3 units CBG 251 - 300: 5 units CBG 301 - 350: 7 units CBG 351 - 400: 9 units CBG > 400: call MD Patient not taking: Reported on 04/19/2024 11/25/23   Raenelle Donalda HERO, MD  Insulin  Syringes, Disposable, U-100 0.5 ML MISC 4x daily injections 03/21/18   Riccio, Angela C, DO  Lancet Devices (ACCU-CHEK SOFTCLIX) lancets Fill for 1 month for TID testing. Use as instructed 11/13/13   Curtis Hadassah DASEN, MD  NEEDLE, DISP, 30 G (BD DISP NEEDLES) 30G X 1/2 MISC For 4x daily injections 11/05/15   Phelps, Jazma Y, DO  REPATHA SURECLICK 140 MG/ML SOAJ Inject 140 mg into the skin every 14 (fourteen) days. Patient not taking: Reported on 03/26/2024 09/16/23    [provider]  VITAMIN D , CHOLECALCIFEROL, PO Take 1 tablet by mouth daily. Patient not taking: Reported on 04/19/2024    [provider]     Critical care time: 98 m     Harden Staff MD. [facility]. [facility] Pager : [phone]  If no response to pager , please call [phone] until 7 pm After 7:00 pm call Elink  [phone]   04/19/2024

## 2024-04-19 NOTE — ED Provider Notes (Signed)
 [place] EMERGENCY DEPARTMENT AT [facility] Provider Note   CSN: [id number] Arrival date & time: 04/19/24  1332     Patient presents with: Code Sepsis   [name] is a 68 y.o. female.  {Add pertinent medical, surgical, social history, OB history to HPI:7315} 68 year old female with past medical history of end-stage renal disease with chronic wound on her right heel presenting to the emergency department today with generalized weakness and hypotension.  The patient's been feeling well now for the past few days.  She is currently waiting to get into wound care for the chronic wound on her ankle.  She has been afebrile but has been having chills over the past few days and decreased appetite.  Initially her blood pressures were low in the field.  She did get a 250 mL bolus and blood pressures improved with this.  She was into the ER at the time further evaluation.        Prior to Admission medications   Medication Sig Start Date End Date Taking? Authorizing Provider  ACCU-CHEK AVIVA PLUS test strip CHECK BLOOD GLUCOSE THREE TIMES DAILY 11/05/22   Joshua Domino, DO  albuterol  (PROVENTIL ) (2.5 MG/3ML) 0.083% nebulizer solution Take 3 mLs (2.5 mg total) by nebulization every 2 (two) hours as needed for wheezing. 11/25/23   Ghimire, Donalda HERO, MD  amLODipine  (NORVASC ) 5 MG tablet Take 0.5 tablets (2.5 mg total) by mouth at bedtime. Patient not taking: Reported on 03/26/2024 02/01/24 03/26/24  [name], MD  apixaban  (ELIQUIS ) 2.5 MG TABS tablet Take 1 tablet (2.5 mg total) by mouth 2 (two) times daily. Patient taking differently: Take 1.25 mg by mouth 2 (two) times daily. 10/04/22   Acharya, Gayatri A, MD  Ascorbic Acid  (VITAMIN C  PO) Take 1 tablet by mouth daily. Patient not taking: Reported on 03/26/2024    [provider]  Blood Glucose Monitoring Suppl (ACCU-CHEK AVIVA PLUS) w/Device KIT CHECK BLOOD GLUCOSE THREE TIMES DAILY 10/26/20   Autry-Lott, Rojean, DO   brimonidine  (ALPHAGAN ) 0.2 % ophthalmic solution Place 1 drop into both eyes 2 (two) times daily.    [provider]  calcitRIOL  (ROCALTROL ) 0.25 MCG capsule TAKE 1 CAPSULE (0.25 MCG TOTAL) EVERY MONDAY, WEDNESDAY, AND FRIDAY Patient taking differently: Take 0.25 mcg by mouth in the morning and at bedtime. 06/22/22   Christia Budds, MD  carvedilol  (COREG ) 12.5 MG tablet TAKE 1 TABLET (12.5 MG TOTAL) BY MOUTH 2 (TWO) TIMES DAILY WITH A MEAL. 12/22/21   Autry-Lott, Rojean, DO  collagenase  (SANTYL ) 250 UNIT/GM ointment Apply 1 Application topically daily. Apply nickel thick amount to ulcer Right lateral ankle 3 x 2 cm with muscle/fascia capsule exposed 04/15/24 05/15/24  Lamount Ethan LITTIE, DPM  DROPLET INSULIN  SYRINGE 31G X 5/16 0.5 ML MISC USE FOUR TIMES DAILY FOR INJECTIONS 09/03/22   Joshua Domino, DO  insulin  aspart (NOVOLOG  FLEXPEN) 100 UNIT/ML FlexPen 0-9 Units, Subcutaneous, 3 times daily with meals CBG < 70: Implement Hypoglycemia measures CBG 70 - 120: 0 units CBG 121 - 150: 1 unit CBG 151 - 200: 2 units CBG 201 - 250: 3 units CBG 251 - 300: 5 units CBG 301 - 350: 7 units CBG 351 - 400: 9 units CBG > 400: call MD 11/25/23   Raenelle Donalda HERO, MD  insulin  glargine (LANTUS ) 100 UNIT/ML injection Inject 0.07 mLs (7 Units total) into the skin daily. Please schedule appointment before next refill. 02/01/24   [name], MD  Insulin  Syringes, Disposable,  U-100 0.5 ML MISC 4x daily injections 03/21/18   Riccio, Angela C, DO  Lancet Devices (ACCU-CHEK [facility]) lancets Fill for 1 month for TID testing. Use as instructed 11/13/13   Curtis Hadassah DASEN, MD  latanoprost  (XALATAN ) 0.005 % ophthalmic solution Place 1 drop into both eyes at bedtime.    [provider]  melatonin 5 MG TABS Take 10 mg by mouth at bedtime as needed (for insomnia).    [provider]  Multiple Vitamin (MULTIVITAMIN WITH MINERALS) TABS Take 1 tablet by mouth daily.    [provider]  NEEDLE,  DISP, 30 G (BD DISP NEEDLES) 30G X 1/2 MISC For 4x daily injections 11/05/15   Phelps, Jazma Y, DO  pantoprazole  (PROTONIX ) 40 MG tablet Take 1 tablet (40 mg total) by mouth daily. Patient not taking: Reported on 03/26/2024 11/25/23   Raenelle Donalda HERO, MD  polyethylene glycol (MIRALAX  / GLYCOLAX ) 17 g packet Take 17 g by mouth daily. Patient not taking: Reported on 03/26/2024 11/25/23   Raenelle Donalda HERO, MD  REPATHA SURECLICK 140 MG/ML SOAJ Inject 140 mg into the skin every 14 (fourteen) days. Patient not taking: Reported on 03/26/2024 09/16/23   [provider]  senna (SENOKOT) 8.6 MG TABS tablet Take 1 tablet (8.6 mg total) by mouth daily. Patient not taking: Reported on 03/26/2024 11/25/23   Raenelle Donalda HERO, MD  VITAMIN D , CHOLECALCIFEROL, PO Take 1 tablet by mouth daily.    [provider]    Allergies: Nsaids, Lisinopril, and Peanut-containing drug products    Review of Systems  Constitutional:  Positive for chills and fatigue.  All other systems reviewed and are negative.   Updated Vital Signs BP (!) 99/57   Pulse 83   Temp 98.3 F (36.8 C)   Resp 17   SpO2 100%   Physical Exam Vitals and nursing note reviewed.   Gen: NAD Eyes: PERRL, EOMI HEENT: no oropharyngeal swelling Neck: trachea midline Resp: clear to auscultation bilaterally Card: RRR, no murmurs, rubs, or gallops Abd: nontender, nondistended, colostomy noted Extremities: Chronic wound noted over the right lateral malleolus, I am able to see the patient's bone, tendons, and hardware from the wound Vascular: 2+ radial pulses bilaterally, 2+ DP pulses bilaterally Skin: no rashes Psyc: acting appropriately   (all labs ordered are listed, but only abnormal results are displayed) Labs Reviewed  CBG MONITORING, ED - Abnormal; Notable for the following components:      Result Value   Glucose-Capillary 381 (*)    All other components within normal limits  RESP PANEL BY RT-PCR (RSV, FLU A&B, COVID)   [id number]  CULTURE, BLOOD (ROUTINE X 2)  CULTURE, BLOOD (ROUTINE X 2)  COMPREHENSIVE METABOLIC PANEL WITH GFR  CBC WITH DIFFERENTIAL/PLATELET  PROTIME-INR  URINALYSIS, W/ REFLEX TO CULTURE (INFECTION SUSPECTED)  I-STAT CG4 LACTIC ACID, ED    EKG: None  Radiology: No results found.  {Document cardiac monitor, telemetry assessment procedure when appropriate:32947} Procedures   Medications Ordered in the ED  lactated ringers  infusion (has no administration in time range)  ceFEPIme  (MAXIPIME ) 2 g in sodium chloride  0.9 % 100 mL IVPB (has no administration in time range)  vancomycin  (VANCOCIN ) IVPB 1000 mg/200 mL premix (has no administration in time range)      {Click here for ABCD2, HEART and other calculators REFRESH Note before signing:1}  Medical Decision Making 68 year old female with past medical history of end-stage renal disease with chronic right leg wound and colostomy presenting to the emergency department today with concern for hypotension in setting of this known chronic wound.  Will cover the patient Kimberlee with vancomycin  and cefepime .  Will initiate sepsis workup.  Will hold off on additional IV fluids as the patient's blood pressures did improved with the initial 250 mL bolus.  Will repeat if necessary but we will hold off to prevent volume overload given her end-stage renal status.  Will obtain an x-ray to evaluate for underlying osteomyelitis.  She will require admission.  Amount and/or Complexity of Data Reviewed Labs: ordered. Radiology: ordered.  Risk Prescription drug management.   ***  {Document critical care time when appropriate  Document review of labs and clinical decision tools ie CHADS2VASC2, etc  Document your independent review of radiology images and any outside records  Document your discussion with family members, caretakers and with consultants  Document social determinants of health affecting pt's care  Document  your decision making why or why not admission, treatments were needed:32947:::1}   Final diagnoses:  None    ED Discharge Orders     None

## 2024-04-19 NOTE — Sepsis Progress Note (Signed)
 Sepsis protocol is being followed by e[facility].

## 2024-04-20 ENCOUNTER — Inpatient Hospital Stay (HOSPITAL_COMMUNITY)

## 2024-04-20 ENCOUNTER — Encounter (HOSPITAL_COMMUNITY)
Admission: EM | Disposition: A | Discharge status: Skilled Nursing Facility | Payer: Self-pay | Source: Home / Self Care | Attending: Pulmonary Disease

## 2024-04-20 ENCOUNTER — Inpatient Hospital Stay (HOSPITAL_COMMUNITY): Admitting: Anesthesiology

## 2024-04-20 ENCOUNTER — Other Ambulatory Visit: Payer: Self-pay

## 2024-04-20 DIAGNOSIS — R7881 Bacteremia: Secondary | ICD-10-CM | POA: Diagnosis not present

## 2024-04-20 DIAGNOSIS — N186 End stage renal disease: Secondary | ICD-10-CM

## 2024-04-20 DIAGNOSIS — R6521 Severe sepsis with septic shock: Secondary | ICD-10-CM | POA: Diagnosis not present

## 2024-04-20 DIAGNOSIS — A419 Sepsis, unspecified organism: Secondary | ICD-10-CM

## 2024-04-20 DIAGNOSIS — B9562 Methicillin resistant Staphylococcus aureus infection as the cause of diseases classified elsewhere: Secondary | ICD-10-CM | POA: Insufficient documentation

## 2024-04-20 DIAGNOSIS — M869 Osteomyelitis, unspecified: Secondary | ICD-10-CM

## 2024-04-20 DIAGNOSIS — E1169 Type 2 diabetes mellitus with other specified complication: Secondary | ICD-10-CM | POA: Diagnosis not present

## 2024-04-20 DIAGNOSIS — M726 Necrotizing fasciitis: Secondary | ICD-10-CM

## 2024-04-20 DIAGNOSIS — J449 Chronic obstructive pulmonary disease, unspecified: Secondary | ICD-10-CM | POA: Diagnosis not present

## 2024-04-20 DIAGNOSIS — I7090 Unspecified atherosclerosis: Secondary | ICD-10-CM | POA: Diagnosis not present

## 2024-04-20 DIAGNOSIS — Z794 Long term (current) use of insulin: Secondary | ICD-10-CM | POA: Diagnosis not present

## 2024-04-20 DIAGNOSIS — M86271 Subacute osteomyelitis, right ankle and foot: Secondary | ICD-10-CM | POA: Diagnosis not present

## 2024-04-20 DIAGNOSIS — M86071 Acute hematogenous osteomyelitis, right ankle and foot: Secondary | ICD-10-CM | POA: Diagnosis not present

## 2024-04-20 HISTORY — DX: End stage renal disease: N18.6

## 2024-04-20 HISTORY — PX: AMPUTATION: SHX166

## 2024-04-20 LAB — BLOOD CULTURE ID PANEL (REFLEXED) - BCID2
A.calcoaceticus-baumannii: NOT DETECTED
Bacteroides fragilis: NOT DETECTED
Candida albicans: NOT DETECTED
Candida auris: NOT DETECTED
Candida glabrata: NOT DETECTED
Candida krusei: NOT DETECTED
Candida parapsilosis: NOT DETECTED
Candida tropicalis: NOT DETECTED
Cryptococcus neoformans/gattii: NOT DETECTED
Enterobacter cloacae complex: NOT DETECTED
Enterobacterales: NOT DETECTED
Enterococcus Faecium: NOT DETECTED
Enterococcus faecalis: NOT DETECTED
Escherichia coli: NOT DETECTED
Haemophilus influenzae: NOT DETECTED
Klebsiella aerogenes: NOT DETECTED
Klebsiella oxytoca: NOT DETECTED
Klebsiella pneumoniae: NOT DETECTED
Listeria monocytogenes: NOT DETECTED
Meth resistant mecA/C and MREJ: DETECTED — AB
Neisseria meningitidis: NOT DETECTED
Proteus species: NOT DETECTED
Pseudomonas aeruginosa: NOT DETECTED
Salmonella species: NOT DETECTED
Serratia marcescens: NOT DETECTED
Staphylococcus aureus (BCID): DETECTED — AB
Staphylococcus epidermidis: NOT DETECTED
Staphylococcus lugdunensis: NOT DETECTED
Staphylococcus species: DETECTED — AB
Stenotrophomonas maltophilia: NOT DETECTED
Streptococcus agalactiae: DETECTED — AB
Streptococcus pneumoniae: NOT DETECTED
Streptococcus pyogenes: NOT DETECTED
Streptococcus species: DETECTED — AB

## 2024-04-20 LAB — CBC
HCT: 27.5 % — ABNORMAL LOW (ref 36.0–46.0)
Hemoglobin: 9.1 g/dL — ABNORMAL LOW (ref 12.0–15.0)
MCH: 26.3 pg (ref 26.0–34.0)
MCHC: 33.1 g/dL (ref 30.0–36.0)
MCV: 79.5 fL — ABNORMAL LOW (ref 80.0–100.0)
Platelets: 244 K/uL (ref 150–400)
RBC: 3.46 MIL/uL — ABNORMAL LOW (ref 3.87–5.11)
RDW: 19 % — ABNORMAL HIGH (ref 11.5–15.5)
WBC: 30.2 K/uL — ABNORMAL HIGH (ref 4.0–10.5)
nRBC: 0.1 % (ref 0.0–0.2)

## 2024-04-20 LAB — BASIC METABOLIC PANEL WITH GFR
Anion gap: 15 (ref 5–15)
BUN: 55 mg/dL — ABNORMAL HIGH (ref 8–23)
CO2: 21 mmol/L — ABNORMAL LOW (ref 22–32)
Calcium: 8.1 mg/dL — ABNORMAL LOW (ref 8.9–10.3)
Chloride: 92 mmol/L — ABNORMAL LOW (ref 98–111)
Creatinine, Ser: 6.89 mg/dL — ABNORMAL HIGH (ref 0.44–1.00)
GFR, Estimated: 6 mL/min — ABNORMAL LOW (ref >60)
Glucose, Bld: 374 mg/dL — ABNORMAL HIGH (ref 70–99)
Potassium: 3.3 mmol/L — ABNORMAL LOW (ref 3.5–5.1)
Sodium: 128 mmol/L — ABNORMAL LOW (ref 135–145)

## 2024-04-20 LAB — GLUCOSE, CAPILLARY
Glucose-Capillary: 388 mg/dL — ABNORMAL HIGH (ref 70–99)
Glucose-Capillary: 380 mg/dL — ABNORMAL HIGH (ref 70–99)
Glucose-Capillary: 304 mg/dL — ABNORMAL HIGH (ref 70–99)
Glucose-Capillary: 207 mg/dL — ABNORMAL HIGH (ref 70–99)
Glucose-Capillary: 115 mg/dL — ABNORMAL HIGH (ref 70–99)
Glucose-Capillary: 138 mg/dL — ABNORMAL HIGH (ref 70–99)
Glucose-Capillary: 169 mg/dL — ABNORMAL HIGH (ref 70–99)
Glucose-Capillary: 170 mg/dL — ABNORMAL HIGH (ref 70–99)

## 2024-04-20 LAB — ECHOCARDIOGRAM COMPLETE BUBBLE STUDY
Area-P 1/2: 3.81 cm2
Calc EF: 56.4 %
S' Lateral: 2.9 cm
Single Plane A2C EF: 55.2 %
Single Plane A4C EF: 57 %

## 2024-04-20 LAB — PHOSPHORUS: Phosphorus: 3.9 mg/dL (ref 2.5–4.6)

## 2024-04-20 LAB — VAS US ABI WITH/WO TBI

## 2024-04-20 LAB — HEPATITIS B SURFACE ANTIGEN: Hepatitis B Surface Ag: NONREACTIVE

## 2024-04-20 LAB — MAGNESIUM: Magnesium: 1.9 mg/dL (ref 1.7–2.4)

## 2024-04-20 SURGERY — AMPUTATION BELOW KNEE
Anesthesia: General | Site: Knee | Laterality: Right

## 2024-04-20 MED ORDER — ETOMIDATE 2 MG/ML IV SOLN
INTRAVENOUS | Status: DC | PRN
Start: 2024-04-20 — End: 2024-04-20
  Administered 2024-04-20: 20 mg via INTRAVENOUS

## 2024-04-20 MED ORDER — VANCOMYCIN HCL 750 MG/150ML IV SOLN
750.0000 mg | Freq: Once | INTRAVENOUS | Status: AC
  Administered 2024-04-21: 750 mg via INTRAVENOUS
  Filled 2024-04-20 (×2): qty 150

## 2024-04-20 MED ORDER — VASOPRESSIN 20 UNITS/100 ML INFUSION FOR SHOCK
0.0300 [IU]/min | INTRAVENOUS | Status: DC
  Administered 2024-04-20 – 2024-04-21 (×4): 0.03 [IU]/min via INTRAVENOUS
  Filled 2024-04-20 (×4): qty 100

## 2024-04-20 MED ORDER — ORAL CARE MOUTH RINSE: 15.0000 mL | OROMUCOSAL | Status: DC | PRN

## 2024-04-20 MED ORDER — SODIUM CHLORIDE 0.9 % IV SOLN: INTRAVENOUS | Status: DC | PRN

## 2024-04-20 MED ORDER — SUGAMMADEX SODIUM 200 MG/2ML IV SOLN
INTRAVENOUS | Status: DC | PRN
  Administered 2024-04-20 (×2): 200 mg via INTRAVENOUS

## 2024-04-20 MED ORDER — INSULIN ASPART 100 UNIT/ML IJ SOLN
0.0000 [IU] | INTRAMUSCULAR | Status: DC
  Administered 2024-04-20: 3 [IU] via SUBCUTANEOUS
  Administered 2024-04-20: 5 [IU] via SUBCUTANEOUS
  Administered 2024-04-20 – 2024-04-21 (×2): 3 [IU] via SUBCUTANEOUS

## 2024-04-20 MED ORDER — FENTANYL CITRATE (PF) 250 MCG/5ML IJ SOLN
INTRAMUSCULAR | Status: AC
Start: 2024-04-20 — End: 2024-04-20
  Filled 2024-04-20: qty 5

## 2024-04-20 MED ORDER — ONDANSETRON HCL 4 MG/2ML IJ SOLN
INTRAMUSCULAR | Status: DC | PRN
  Administered 2024-04-20: 4 mg via INTRAVENOUS

## 2024-04-20 MED ORDER — DEXAMETHASONE SODIUM PHOSPHATE 10 MG/ML IJ SOLN
INTRAMUSCULAR | Status: DC | PRN
  Administered 2024-04-20: 4 mg via INTRAVENOUS

## 2024-04-20 MED ORDER — CHLORHEXIDINE GLUCONATE CLOTH 2 % EX PADS
6.0000 | MEDICATED_PAD | Freq: Every day | CUTANEOUS | Status: DC
  Administered 2024-04-22 – 2024-04-28 (×6): 6 via TOPICAL

## 2024-04-20 MED ORDER — PROPOFOL 10 MG/ML IV BOLUS
INTRAVENOUS | Status: AC
  Filled 2024-04-20: qty 20

## 2024-04-20 MED ORDER — FENTANYL CITRATE (PF) 250 MCG/5ML IJ SOLN
INTRAMUSCULAR | Status: DC | PRN
  Administered 2024-04-20: 100 ug via INTRAVENOUS

## 2024-04-20 MED ORDER — INSULIN ASPART 100 UNIT/ML IJ SOLN
0.0000 [IU] | INTRAMUSCULAR | Status: DC
  Administered 2024-04-20: 7 [IU] via SUBCUTANEOUS

## 2024-04-20 MED ORDER — VITAMIN C 500 MG PO TABS
1000.0000 mg | ORAL_TABLET | Freq: Every day | ORAL | Status: DC
  Administered 2024-04-20 – 2024-04-28 (×8): 1000 mg via ORAL
  Filled 2024-04-20 (×8): qty 2

## 2024-04-20 MED ORDER — HEPARIN SODIUM (PORCINE) 5000 UNIT/ML IJ SOLN
5000.0000 [IU] | Freq: Three times a day (TID) | INTRAMUSCULAR | Status: DC
  Administered 2024-04-20 – 2024-04-21 (×2): 5000 [IU] via SUBCUTANEOUS
  Filled 2024-04-20 (×2): qty 1

## 2024-04-20 MED ORDER — ZINC SULFATE 220 (50 ZN) MG PO CAPS
220.0000 mg | ORAL_CAPSULE | Freq: Every day | ORAL | Status: DC
  Administered 2024-04-20 – 2024-04-28 (×8): 220 mg via ORAL
  Filled 2024-04-20 (×8): qty 1

## 2024-04-20 MED ORDER — JUVEN PO PACK
1.0000 | PACK | Freq: Two times a day (BID) | ORAL | Status: DC
  Administered 2024-04-21 – 2024-04-26 (×6): 1 via ORAL
  Filled 2024-04-20 (×8): qty 1

## 2024-04-20 MED ORDER — MIDODRINE HCL 5 MG PO TABS
10.0000 mg | ORAL_TABLET | Freq: Three times a day (TID) | ORAL | Status: DC
  Administered 2024-04-20 – 2024-04-28 (×25): 10 mg via ORAL
  Filled 2024-04-20 (×24): qty 2

## 2024-04-20 MED ORDER — ROCURONIUM BROMIDE 10 MG/ML (PF) SYRINGE
PREFILLED_SYRINGE | INTRAVENOUS | Status: DC | PRN
  Administered 2024-04-20: 50 mg via INTRAVENOUS

## 2024-04-20 MED ORDER — VASHE WOUND IRRIGATION OPTIME
TOPICAL | Status: DC | PRN
Start: 2024-04-20 — End: 2024-04-20
  Administered 2024-04-20: 34 [oz_av]

## 2024-04-20 MED ORDER — PERFLUTREN LIPID MICROSPHERE
1.0000 mL | INTRAVENOUS | Status: AC | PRN
  Administered 2024-04-20: 2 mL via INTRAVENOUS

## 2024-04-20 MED ORDER — LATANOPROST 0.005 % OP SOLN
1.0000 [drp] | Freq: Every day | OPHTHALMIC | Status: DC
  Administered 2024-04-20 – 2024-04-21 (×2): 1 [drp] via OPHTHALMIC
  Filled 2024-04-20: qty 2.5

## 2024-04-20 MED ORDER — INSULIN GLARGINE-YFGN 100 UNIT/ML ~~LOC~~ SOLN
20.0000 [IU] | Freq: Every day | SUBCUTANEOUS | Status: DC
  Administered 2024-04-20 – 2024-04-22 (×3): 20 [IU] via SUBCUTANEOUS
  Filled 2024-04-20 (×3): qty 0.2

## 2024-04-20 MED ORDER — VANCOMYCIN VARIABLE DOSE PER UNSTABLE RENAL FUNCTION (PHARMACIST DOSING): Status: DC

## 2024-04-20 MED ORDER — BRIMONIDINE TARTRATE 0.2 % OP SOLN
1.0000 [drp] | Freq: Two times a day (BID) | OPHTHALMIC | Status: DC
  Administered 2024-04-20 – 2024-04-28 (×15): 1 [drp] via OPHTHALMIC
  Filled 2024-04-20: qty 5

## 2024-04-20 SURGICAL SUPPLY — 34 items
BAG COUNTER SPONGE SURGICOUNT (BAG) IMPLANT
BLADE SAW RECIP 87.9 MT (BLADE) ×1 IMPLANT
BLADE SURG 21 STRL SS (BLADE) ×1 IMPLANT
BNDG COHESIVE 6X5 TAN ST LF (GAUZE/BANDAGES/DRESSINGS) IMPLANT
CANISTER WOUND CARE 500ML ATS (WOUND CARE) ×1 IMPLANT
CLEANSER WND VASHE INSTL 34OZ (WOUND CARE) IMPLANT
COVER SURGICAL LIGHT HANDLE (MISCELLANEOUS) ×1 IMPLANT
CUFF TRNQT CYL 34X4.125X (TOURNIQUET CUFF) ×1 IMPLANT
DRAPE INCISE IOBAN 66X45 STRL (DRAPES) ×1 IMPLANT
DRAPE U-SHAPE 47X51 STRL (DRAPES) ×1 IMPLANT
DRESSING PREVENA PLUS CUSTOM (GAUZE/BANDAGES/DRESSINGS) ×1 IMPLANT
DRSG VAC PEEL AND PLACE LRG (GAUZE/BANDAGES/DRESSINGS) IMPLANT
DURAPREP 26ML APPLICATOR (WOUND CARE) ×1 IMPLANT
ELECTRODE REM PT RTRN 9FT ADLT (ELECTROSURGICAL) ×1 IMPLANT
GLOVE BIOGEL PI IND STRL 9 (GLOVE) ×1 IMPLANT
GLOVE SURG ORTHO 9.0 STRL STRW (GLOVE) ×1 IMPLANT
GOWN STRL REUS W/ TWL XL LVL3 (GOWN DISPOSABLE) ×2 IMPLANT
GRAFT SKIN WND MICRO 38 (Tissue) IMPLANT
KIT BASIN OR (CUSTOM PROCEDURE TRAY) ×1 IMPLANT
KIT TURNOVER KIT B (KITS) ×1 IMPLANT
MANIFOLD NEPTUNE II (INSTRUMENTS) ×1 IMPLANT
NS IRRIG 1000ML POUR BTL (IV SOLUTION) ×1 IMPLANT
PACK ORTHO EXTREMITY (CUSTOM PROCEDURE TRAY) ×1 IMPLANT
PAD ARMBOARD POSITIONER FOAM (MISCELLANEOUS) ×1 IMPLANT
PREVENA RESTOR ARTHOFORM 46X30 (CANNISTER) ×1 IMPLANT
SPONGE T-LAP 18X18 ~~LOC~~+RFID (SPONGE) IMPLANT
STAPLER SKIN PROX 35W (STAPLE) IMPLANT
STOCKINETTE IMPERVIOUS LG (DRAPES) ×1 IMPLANT
SUT ETHILON 2 0 PSLX (SUTURE) IMPLANT
SUT SILK 2-0 18XBRD TIE 12 (SUTURE) ×1 IMPLANT
SUT VIC AB 1 CTX 27 (SUTURE) ×2 IMPLANT
TOWEL GREEN STERILE (TOWEL DISPOSABLE) ×1 IMPLANT
TUBE CONNECTING 12X1/4 (SUCTIONS) ×1 IMPLANT
YANKAUER SUCT BULB TIP NO VENT (SUCTIONS) ×1 IMPLANT

## 2024-04-20 NOTE — Transfer of Care (Signed)
 Immediate Anesthesia Transfer of Care Note  Patient: [name]  Procedure(s) Performed: AMPUTATION BELOW KNEE (Right: Knee)  Patient Location: PACU  Anesthesia Type:General  Level of Consciousness: awake, alert , and oriented  Airway & Oxygen Therapy: Patient Spontanous Breathing and Patient connected to nasal cannula oxygen  Post-op Assessment: Report given to RN and Post -op Vital signs reviewed and stable  Post vital signs: Reviewed and stable  Last Vitals:  Vitals Value Taken Time  BP 108/64 04/20/24 18:50  Temp 37 C 04/20/24 18:50  Pulse 71 04/20/24 18:54  Resp 15 04/20/24 18:54  SpO2 100 % 04/20/24 18:54  Vitals shown include unfiled device data.  Last Pain:  Vitals:   04/20/24 1601  TempSrc: Oral  PainSc:       Patients Stated Pain Goal: 0 (04/20/24 0845)  Complications: No notable events documented.

## 2024-04-20 NOTE — Progress Notes (Addendum)
 NAME:  RAIYAH SPEAKMAN, MRN:  [id number], DOB:  10-13-1955, LOS: 1 ADMISSION DATE:  04/19/2024, CONSULTATION DATE:  04/17/2024 REFERRING MD: EDP, CHIEF COMPLAINT: Hypotension  History of Present Illness:  68 year old woman with end-stage renal disease on HD who initially presented with general weakness likely due to sepsis and also found to have osteomyelitis of the right fibula and septic arthritis with exposure of medullary bone of air on x-ray is concerning for necrotizing fasciitis.  Orthopedics was consulted who is recommending transtibial amputation on the right. Pertinent  Medical History   Past Medical History:  Diagnosis Date   Anemia    Arthritis    Atrial fibrillation (HCC)    CAD (coronary artery disease)    Chronic kidney disease, stage III (moderate) (HCC)    Constipation    Diabetes mellitus (HCC)    History of blood transfusion    HLD (hyperlipidemia)    Hypertension    Myocardial infarction [facility])    in April 2014     Significant Hospital Events: Including procedures, antibiotic start and stop dates in addition to other pertinent events    Interim History / Subjective:  7/27 Admitted 7/28 ABIs ordered, orthopedics consulted, planning for transtibial amputation  Objective   Blood pressure (!) 103/51, pulse 75, temperature (!) 97.5 F (36.4 C), temperature source Oral, resp. rate 17, weight 73 kg, SpO2 99%.        Intake/Output Summary (Last 24 hours) at 04/20/2024 0727 Last data filed at 04/20/2024 0600 Gross per 24 hour  Intake 1623.36 ml  Output --  Net 1623.36 ml   Filed Weights   04/20/24 0449  Weight: 73 kg    Examination: General: Chronically ill-appearing, laying in bed in no acute distress HENT: Right IJ tunneled catheter in place Lungs: On room air, lungs are clear to auscultation bilaterally Cardiovascular: Regular rate and rhythm Abdomen: Soft nontender, no guarding or rebound tenderness Extremities: Warm and dry Neuro: Alert,  responding to questions appropriately, oriented to self time and place GU: Deferred  Resolved Hospital Problem list   N/A  Assessment & Plan:  Septic shock -  Likely in the setting of osteomyelitis and septic arthritis -  Blood cultures growing MRSA and group B strep -  Will discontinue Clindamycin  ,low concern for necrotizing fasciitis -  Vancomycin   -  Blood pressure mildly improved 103/58 -  Increase Midodrine  to 10   Right ankle wound -ABIs pending - Likely source of infection - Orthopedics is following, appreciate Dr. Crist recs - Pending possible tibia amputation today - PT/OT post op   End-stage renal disease on hemodialysis - Nephrology following, dialysis as needed  Type 2 diabetes -Moderate SSI - Semglee  20 units @10  pm  - CBG monitoring  DVT ppx - Heparin   Best Practice (right click and Reselect all SmartList Selections daily)   Diet/type: NPO w/ oral meds DVT prophylaxis: DOAC GI prophylaxis: N/A Lines: Central line Foley:  N/A Code Status:  DNR Last date of multidisciplinary goals of care discussion [as above]  Labs   CBC: Recent Labs  Lab 04/19/24 1341 04/20/24 0340  WBC 28.5* 30.2*  NEUTROABS 27.4*  --   HGB 9.4* 9.1*  HCT 29.2* 27.5*  MCV 82.7 79.5*  PLT 225 244    Basic Metabolic Panel: Recent Labs  Lab 04/19/24 1341 04/19/24 2016 04/20/24 0340  NA 128* 130* 128*  K 3.9 3.8 3.3*  CL 92* 94* 92*  CO2 17* 17* 21*  GLUCOSE 391* 436* 374*  BUN 54* 51* 55*  CREATININE 7.17* 6.96* 6.89*  CALCIUM  8.0* 7.9* 8.1*  MG  --   --  1.9  PHOS  --   --  3.9   GFR: Estimated Creatinine Clearance: 7.6 mL/min (A) (by C-G formula based on SCr of 6.89 mg/dL (H)). Recent Labs  Lab 04/19/24 1341 04/19/24 1409 04/19/24 2016 04/20/24 0340  WBC 28.5*  --   --  30.2*  LATICACIDVEN  --  2.6* 1.5  --     Liver Function Tests: Recent Labs  Lab 04/19/24 1341  AST 13*  ALT 12  ALKPHOS 106  BILITOT 2.0*  PROT 5.4*  ALBUMIN  1.5*   No  results for input(s): LIPASE, AMYLASE in the last 168 hours. No results for input(s): AMMONIA in the last 168 hours.  ABG    Component Value Date/Time   HCO3 29.4 (H) 08/01/2010 0805   TCO2 21 01/08/2013 1242   ACIDBASEDEF 7.0 (H) 06/14/2009 1616   O2SAT 59.0 08/01/2010 0805     Coagulation Profile: Recent Labs  Lab 04/19/24 1341  INR 1.1    Cardiac Enzymes: No results for input(s): CKTOTAL, CKMB, CKMBINDEX, TROPONINI in the last 168 hours.  HbA1C: HbA1c, POC (controlled diabetic range)  Date/Time Value Ref Range Status  02/28/2021 02:50 PM 6.4 0.0 - 7.0 % Final  06/16/2020 01:52 PM 6.6 0.0 - 7.0 % Final   Hgb A1c MFr Bld  Date/Time Value Ref Range Status  10/28/2023 05:22 AM 6.6 (H) 4.8 - 5.6 % Final    Comment:    (NOTE) Pre diabetes:          5.7%-6.4%  Diabetes:              >6.4%  Glycemic control for   <7.0% adults with diabetes     CBG: Recent Labs  Lab 04/19/24 1340 04/19/24 1958 04/19/24 2228 04/20/24 0101 04/20/24 0334  GLUCAP 381* 436* 396* 388* 380*    Review of Systems:   Endorses generalized weakness.  Denies any chest pain or shortness of breath  Past Medical History:  She,  has a past medical history of Anemia, Arthritis, Atrial fibrillation (HCC), CAD (coronary artery disease), Chronic kidney disease, stage III (moderate) (HCC), Constipation, Diabetes mellitus (HCC), History of blood transfusion, HLD (hyperlipidemia), Hypertension, and Myocardial infarction (HCC).   Surgical History:   Past Surgical History:  Procedure Laterality Date   ANKLE SURGERY Right    x 6 total   BREAST BIOPSY Left    15 or 20 yearsa ago she cant remember laterality    HARDWARE REMOVAL Right 02/13/2013   Procedure: HARDWARE REMOVAL;  Surgeon: Jerona LULLA Sage, MD;  Location: [facility] OR;  Service: Orthopedics;  Laterality: Right;  Right Ankle Removal Hardware, Debridement, Place Wound VAC   IR FLUORO GUIDE CV LINE LEFT  11/08/2023   IR FLUORO GUIDE CV  LINE RIGHT  11/01/2023   IR FLUORO GUIDE CV LINE RIGHT  11/12/2023   IR US  GUIDE VASC ACCESS LEFT  11/08/2023   IR US  GUIDE VASC ACCESS RIGHT  11/01/2023   IR US  GUIDE VASC ACCESS RIGHT  11/12/2023   laser surgery for diabetic retinopathy Bilateral    LEFT HEART CATHETERIZATION WITH CORONARY ANGIOGRAM N/A 01/09/2013   Procedure: LEFT HEART CATHETERIZATION WITH CORONARY ANGIOGRAM;  Surgeon: Toribio JONELLE Fuel, MD;  Location: [facility] CATH LAB;  Service: Cardiovascular;  Laterality: N/A;   ORIF ANKLE FRACTURE Right 01/12/2013   Procedure: OPEN REDUCTION INTERNAL FIXATION (ORIF) ANKLE FRACTURE;  Surgeon: Jerona  LULLA Sage, MD;  Location: [facility] OR;  Service: Orthopedics;  Laterality: Right;   PARTIAL COLECTOMY N/A 11/02/2023   Procedure: OPEN PARTIAL COLECTOMY WITH END COLOSTOMY;  Surgeon: Dasie Leonor CROME, MD;  Location: [facility] OR;  Service: General;  Laterality: N/A;   PARTIAL HYSTERECTOMY     TUNNELLED CATHETER EXCHANGE N/A 04/15/2024   Procedure: TUNNELLED CATHETER EXCHANGE;  Surgeon: Pearline Norman RAMAN, MD;  Location: [facility] LAB;  Service: Cardiovascular;  Laterality: N/A;     Social History:   reports that she has never smoked. She has never used smokeless tobacco. She reports that she does not drink alcohol  and does not use drugs.   Family History:  Her family history includes Cervical cancer in her sister; Colon cancer (age of onset: 69) in her mother; Congestive Heart Failure in her sister; Diabetes in her brother, mother, and sister; Heart disease in her father and maternal grandmother; Hypertension in her father; Kidney disease in her brother; Sickle cell anemia in her son; Thyroid  disease in her son. There is no history of Breast cancer or BRCA 1/2.   Allergies Allergies  Allergen Reactions   Nsaids Other (See Comments)    CKD stage 4   Lisinopril Other (See Comments)    coughing   Peanut-Containing Drug Products Itching and Other (See Comments)    GI intolerance - diarrhea     Home Medications  Prior  to Admission medications   Medication Sig Start Date End Date Taking? Authorizing Provider  albuterol  (PROVENTIL ) (2.5 MG/3ML) 0.083% nebulizer solution Take 3 mLs (2.5 mg total) by nebulization every 2 (two) hours as needed for wheezing. 11/25/23  Yes Ghimire, Donalda HERO, MD  apixaban  (ELIQUIS ) 2.5 MG TABS tablet Take 1 tablet (2.5 mg total) by mouth 2 (two) times daily. 10/04/22  Yes Acharya, Gayatri A, MD  brimonidine  (ALPHAGAN ) 0.2 % ophthalmic solution Place 1 drop into both eyes 2 (two) times daily.   Yes [provider]  calcitRIOL  (ROCALTROL ) 0.25 MCG capsule TAKE 1 CAPSULE (0.25 MCG TOTAL) EVERY MONDAY, WEDNESDAY, AND FRIDAY Patient taking differently: Take 0.25 mcg by mouth in the morning and at bedtime. 06/22/22  Yes Christia Budds, MD  carvedilol  (COREG ) 12.5 MG tablet TAKE 1 TABLET (12.5 MG TOTAL) BY MOUTH 2 (TWO) TIMES DAILY WITH A MEAL. 12/22/21  Yes Autry-Lott, Rojean, DO  insulin  glargine (LANTUS ) 100 UNIT/ML injection Inject 0.07 mLs (7 Units total) into the skin daily. Please schedule appointment before next refill. Patient taking differently: Inject 10 Units into the skin daily. Please schedule appointment before next refill. 02/01/24  Yes [name], MD  latanoprost  (XALATAN ) 0.005 % ophthalmic solution Place 1 drop into both eyes at bedtime.   Yes [provider]  melatonin 5 MG TABS Take 10 mg by mouth at bedtime as needed (for insomnia).   Yes [provider]  midodrine  (PROAMATINE ) 2.5 MG tablet Take 2.5 mg by mouth.   Yes [provider]  Multiple Vitamin (MULTIVITAMIN WITH MINERALS) TABS Take 1 tablet by mouth daily.   Yes [provider]  NOVOLIN R 100 UNIT/ML injection Inject 0-9 Units into the skin in the morning, at noon, and at bedtime. Per sliding scale 04/03/24  Yes [provider]  ACCU-CHEK AVIVA PLUS test strip CHECK BLOOD GLUCOSE THREE TIMES DAILY 11/05/22   Joshua Domino, DO  amLODipine  (NORVASC ) 5 MG tablet  Take 0.5 tablets (2.5 mg total) by mouth at bedtime. Patient not taking: Reported on 03/26/2024 02/01/24 03/26/24  Yolande Lamar  C, MD  Blood Glucose Monitoring Suppl (ACCU-CHEK AVIVA PLUS) w/Device KIT CHECK BLOOD GLUCOSE THREE TIMES DAILY 10/26/20   Autry-Lott, Rojean, DO  collagenase  (SANTYL ) 250 UNIT/GM ointment Apply 1 Application topically daily. Apply nickel thick amount to ulcer Right lateral ankle 3 x 2 cm with muscle/fascia capsule exposed Patient not taking: Reported on 04/19/2024 04/15/24 05/15/24  Lamount Ethan CROME, DPM  DROPLET INSULIN  SYRINGE 31G X 5/16 0.5 ML MISC USE FOUR TIMES DAILY FOR INJECTIONS 09/03/22   Joshua Domino, DO  furosemide  (LASIX ) 40 MG tablet Take 40 mg by mouth 2 (two) times daily. Patient not taking: Reported on 04/19/2024    [provider]  insulin  aspart (NOVOLOG  FLEXPEN) 100 UNIT/ML FlexPen 0-9 Units, Subcutaneous, 3 times daily with meals CBG < 70: Implement Hypoglycemia measures CBG 70 - 120: 0 units CBG 121 - 150: 1 unit CBG 151 - 200: 2 units CBG 201 - 250: 3 units CBG 251 - 300: 5 units CBG 301 - 350: 7 units CBG 351 - 400: 9 units CBG > 400: call MD Patient not taking: Reported on 04/19/2024 11/25/23   Raenelle Donalda HERO, MD  Insulin  Syringes, Disposable, U-100 0.5 ML MISC 4x daily injections 03/21/18   Riccio, Angela C, DO  Lancet Devices (ACCU-CHEK SOFTCLIX) lancets Fill for 1 month for TID testing. Use as instructed 11/13/13   Curtis Hadassah DASEN, MD  NEEDLE, DISP, 30 G (BD DISP NEEDLES) 30G X 1/2 MISC For 4x daily injections 11/05/15   Phelps, Jazma Y, DO  REPATHA SURECLICK 140 MG/ML SOAJ Inject 140 mg into the skin every 14 (fourteen) days. Patient not taking: Reported on 03/26/2024 09/16/23   [provider]  VITAMIN D , CHOLECALCIFEROL, PO Take 1 tablet by mouth daily. Patient not taking: Reported on 04/19/2024    [provider]     Critical care time: 40 minutes      Drue Grow MD [facility] Internal Medicine Program -  PGY-2 04/20/2024, 7:27 AM Pager# [phone]

## 2024-04-20 NOTE — Plan of Care (Signed)
  Problem: Activity: Goal: Risk for activity intolerance will decrease Outcome: Progressing   Problem: Nutrition: Goal: Adequate nutrition will be maintained Outcome: Progressing   Problem: Coping: Goal: Level of anxiety will decrease Outcome: Progressing   Problem: Pain Managment: Goal: General experience of comfort will improve and/or be controlled Outcome: Progressing   Problem: Safety: Goal: Ability to remain free from injury will improve Outcome: Progressing

## 2024-04-20 NOTE — TOC Initial Note (Addendum)
 Transition of Care [facility]) - Initial/Assessment Note    Patient Details  Name: [name] MRN: [id number] Date of Birth: 1956/05/16  Transition of Care [facility]) CM/SW Contact:    Lauraine FORBES Saa, LCSW Phone Number: 04/20/2024, 12:13 PM  Clinical Narrative:                  12:13 PM CSW introduced self and role to patient. Patient confirmed she resides at home with son. Patient confirmed SNF history with [facility]. Patient confirmed HH history (per chart review, with [facility]). Patient confirmed DME (wheelchair, [facility]) history (per chart review, with [facility] and [facility]). Patient expressed interest in an electric wheelchair. CSW informed patient that electric wheelchairs can be ordered outpatient with PCP. Patient expressed understanding of the information. Patient expressed interest in discharging to SNF and residing at a nursing facility permanently (sole income is $1500 a month in SSI/retirement). CSW provided education on SNF to patient and answer questions regarding SNF. CSW consulted financial counseling for further assistance with LTC Medicaid. Patient declined CSW offer of SDOH (social connections) resources. Per chart review, patient has a PCP and insurance. Patient's preferred [facility]'s are [facility] [address], [facility] [address] and [facility].  Expected Discharge Plan: Skilled Nursing Facility Barriers to Discharge: Continued Medical Work up   Patient Goals and CMS Choice Patient states their goals for this hospitalization and ongoing recovery are:: SNF          Expected Discharge Plan and Services In-house Referral: Clinical Social Work   Post Acute Care Choice: Skilled Nursing Facility Living arrangements for the past 2 months: Single Family Home                                      Prior Living Arrangements/Services Living arrangements for the past 2 months: Single Family Home Lives with:: Adult Children Patient  language and need for interpreter reviewed:: Yes        Need for Family Participation in Patient Care: No (Comment)   Current home services: DME Criminal Activity/Legal Involvement Pertinent to Current Situation/Hospitalization: No - Comment as needed  Activities of Daily Living      Permission Sought/Granted Permission sought to share information with : Family Supports, Oceanographer granted to share information with : No (Contact information on chart)  Share Information with NAME: [name] Neighbors  Permission granted to share info w AGENCY: SNF  Permission granted to share info w Relationship: Son  Permission granted to share info w Contact Information: [phone]  Emotional Assessment Appearance:: Appears stated age Attitude/Demeanor/Rapport: Engaged Affect (typically observed): Accepting, Appropriate, Adaptable, Calm, Stable, Pleasant Orientation: : Oriented to Self, Oriented to Place, Oriented to  Time, Oriented to Situation Alcohol  / Substance Use: Not Applicable Psych Involvement: No (comment)  Admission diagnosis:  Septic shock (HCC) [A41.9, R65.21] Osteomyelitis, unspecified site, unspecified type [facility]) [M86.9] Patient Active Problem List   Diagnosis Date Noted   Subacute osteomyelitis, right ankle and foot (HCC) 04/20/2024   Septic shock (HCC) 04/19/2024   Irritant contact dermatitis associated with fecal stoma 12/27/2023   Colostomy complication, unspecified (HCC) 12/27/2023   Diverticulitis of large intestine with complication 10/27/2023   Chronic kidney disease, stage 5 (HCC) 10/27/2023   Chronic nonintractable headache 03/03/2021   Foot callus 02/27/2021   Counseled about COVID-19 virus infection 02/24/2020   Aortic heart murmur 05/15/2019   Thyroid  enlargement 07/19/2014  Vitamin D  deficiency 09/03/2013   Hyperlipidemia 04/24/2013   Malunion of fracture 03/17/2013   Wound infection after surgery 03/17/2013   Diabetic  retinopathy (HCC) 01/23/2013   Chronic atrial fibrillation (HCC) 01/15/2013   CAD (coronary artery disease) 01/11/2013   H/O non-ST elevation myocardial infarction (NSTEMI) 01/09/2013   History of stroke 01/08/2013   Diabetic neuropathy 01/08/2013   Hypertension    Diabetes mellitus    Chronic kidney disease (CKD), stage IV (severe) (HCC)    PCP:  Campbell Reynolds, NP Pharmacy:   [facility] - [place], [place] - [address] [place] [address] Phone: [phone] Fax: [phone]  [id number] [address] [place]), [place] - [address] [place] ([place] [address] Phone: [phone] Fax: [phone]  [id number] [place], [place] - [address] [place] [address] Phone: [phone] Fax: [phone]     Social Drivers of Health (SDOH) Social History: SDOH Screenings   Food Insecurity: No Food Insecurity (04/19/2024)  Housing: Low Risk  (04/19/2024)  Transportation Needs: No Transportation Needs (04/19/2024)  Utilities: Not At Risk (04/19/2024)  Depression (PHQ2-9): Low Risk  (02/28/2021)  Financial Resource Strain: Low Risk  (08/25/2021)   Received from [facility]  Physical Activity: Insufficiently Active (06/20/2021)   Received from [facility]  Social Connections: Socially Isolated (04/19/2024)  Stress: No Stress Concern Present (08/25/2021)   Received from [facility]  Tobacco Use: Low Risk  (03/26/2024)   SDOH Interventions: Social Connections Interventions: Patient Declined   Readmission Risk Interventions     No data to display

## 2024-04-20 NOTE — Progress Notes (Signed)
 ABI exam is completed. Kylie Simmonds, RVT

## 2024-04-20 NOTE — Anesthesia Preprocedure Evaluation (Signed)
 Anesthesia Evaluation  Patient identified by MRN, date of birth, ID band Patient awake    Reviewed: Allergy & Precautions, NPO status , Patient's Chart, lab work & pertinent test results, reviewed documented beta blocker date and time   History of Anesthesia Complications Negative for: history of anesthetic complications  Airway Mallampati: II  TM Distance: >3 FB Neck ROM: Full    Dental  (+) Missing, Dental Advisory Given   Pulmonary COPD,  COPD inhaler   breath sounds clear to auscultation       Cardiovascular hypertension, Pt. on medications and Pt. on home beta blockers (-) angina + CAD and + Past MI   Rhythm:Regular Rate:Normal  Presently hypotensive with necrotizing fasciitis: mitidrine, Norepi, Vasopressin   03/2023 ECHO: EF 60 to 65%.  1. The LV has normal function, no regional wall motion abnormalities. There is mild LVH. Grade I diastolic dysfunction (impaired relaxation).   2. RVF is normal. The right ventricular size is normal. There is normal pulmonary artery systolic pressure.   3. Left atrial size was mildly dilated.   4. No evidence of mitral valve regurgitation.   5. The aortic valve is tricuspid. Aortic valve regurgitation is not visualized.   04/2023 Stress The study is normal. The study is low risk.   No ST deviation was noted.   Left ventricular function is normal. Nuclear stress EF: 66%. The left ventricular ejection fraction is hyperdynamic (>65%). End diastolic cavity size is normal. End systolic cavity size is normal.    Neuro/Psych  Headaches    GI/Hepatic negative GI ROS, Neg liver ROS,,,  Endo/Other  diabetes, Insulin  Dependent    Renal/GU Dialysis and ESRFRenal disease (K+ 3.3, dialyzed Friday)     Musculoskeletal  (+) Arthritis ,    Abdominal   Peds  Hematology  (+) Blood dyscrasia (Hb 9.1, plt 244k), anemia eliquis    Anesthesia Other Findings   Reproductive/Obstetrics                               Anesthesia Physical Anesthesia Plan  ASA: 4  Anesthesia Plan: General   Post-op Pain Management: Dilaudid  IV and Tylenol  PO (pre-op )*   Induction: Intravenous  PONV Risk Score and Plan: 3 and Ondansetron , Dexamethasone  and Treatment may vary due to age or medical condition  Airway Management Planned: Oral ETT  Additional Equipment: None  Intra-op Plan:   Post-operative Plan: Extubation in OR  Informed Consent: I have reviewed the patients History and Physical, chart, labs and discussed the procedure including the risks, benefits and alternatives for the proposed anesthesia with the patient or authorized representative who has indicated his/her understanding and acceptance.   Patient has DNR.  Discussed DNR with patient and Suspend DNR.   Dental advisory given  Plan Discussed with: CRNA and Surgeon  Anesthesia Plan Comments: (Pt agrees to be intubated for this surgery, is currently on pressors, understands DNR suspension perioperatively Pt was straight back to the OR because of hemodynamic instability and pressor requirements, so no opportunity for pre-op  regional anesthesia for post op pain, however she does agree to regional pain relief if she is unable to achieve satisfactory analgesia in PACU)         Anesthesia Quick Evaluation

## 2024-04-20 NOTE — Progress Notes (Signed)
 PT Cancellation Note  Patient Details Name: [name] MRN: [id number] DOB: 17-May-1956   Cancelled Treatment:    Reason Eval/Treat Not Completed: Other (comment) (noted plan for Rt BKA and will hold therapy at this time)   Ranyia Witting B Mcadoo Muzquiz 04/20/2024, 1:32 PM Lenoard SQUIBB, PT Acute Rehabilitation Services Office: [phone]

## 2024-04-20 NOTE — Op Note (Signed)
 04/20/2024  6:48 PM  PATIENT:  [name]    PRE-OPERATIVE DIAGNOSIS: Necrotizing fasciitis with osteomyelitis right leg  POST-OPERATIVE DIAGNOSIS:  Same  PROCEDURE:  AMPUTATION BELOW KNEE Application of Kerecis micro graft 38 cm. Application of Prevena customizable and Prevena arthroform wound VAC dressings  SURGEON:  Jerona LULLA Sage, MD  ANESTHESIA:   General  PREOPERATIVE INDICATIONS:  [name] is a  68 y.o. female with a diagnosis of liquified facia right leg who failed conservative measures and elected for surgical management.    The risks benefits and alternatives were discussed with the patient preoperatively including but not limited to the risks of infection, bleeding, nerve injury, cardiopulmonary complications, the need for revision surgery, among others, and the patient was willing to proceed.  OPERATIVE IMPLANTS:   Implant Name Type Inv. Item Serial No. Manufacturer Lot No. LRB No. Used Action  GRAFT SKIN WND MICRO 38 - [id number] Tissue GRAFT SKIN WND MICRO 38  [employer] [id number] Right 1 Implanted     OPERATIVE FINDINGS: There was no purulence at the level of amputation.  There was some fluid filled fascia and this was sent for cultures.  OPERATIVE PROCEDURE: Patient was brought to the operating room after undergoing a regional anesthetic.  After adequate levels anesthesia were obtained a thigh tourniquet was placed and the lower extremity was prepped using DuraPrep draped into a sterile field. The foot was draped out of the sterile field with impervious stockinette.  A timeout was called and the tourniquet inflated.  A transverse skin incision was made 12 cm distal to the tibial tubercle, the incision curved proximally, and a large posterior flap was created.  The tibia was transected just proximal to the skin incision and beveled anteriorly.  The fibula was transected just proximal to the tibial incision.  The sciatic nerve was pulled cut and allowed  to retract.  The vascular bundles were suture ligated with 2-0 silk.  The tourniquet was deflated and hemostasis obtained.  The wound was irrigated with Vashe.   The Kerecis micro powder 38 cm was applied to the open wound that has a 200 cm surface area.    The deep and superficial fascial layers were closed using #1 Vicryl.  The skin was closed using staples.    The Prevena customizable dressing was applied this was overwrapped with the arthroform sponge.  Parthenia was used to secure the sponges and the circumferential compression was secured to the skin with Dermatac.  This was connected to the wound VAC pump and had a good suction fit this was covered with a stump shrinker and a limb protector.  Patient was taken to the PACU in stable condition.   DISCHARGE PLANNING:  Antibiotic duration: Continue antibiotics for sepsis.  Fascial tissue was sent for cultures.  Adjust antibiotics according to tissue cultures.  Weightbearing: Nonweightbearing on the operative extremity  Pain medication: Opioid pathway  Dressing care/ Wound VAC: Continue wound VAC with the Prevena plus pump at discharge for 1 week  Ambulatory devices: Walker or kneeling scooter  Discharge to: Discharge planning based on recommendations per physical therapy  Follow-up: In the office 1 week after discharge.

## 2024-04-20 NOTE — Anesthesia Postprocedure Evaluation (Signed)
 Anesthesia Post Note  Patient: [name]  Procedure(s) Performed: AMPUTATION BELOW KNEE (Right: Knee)     Patient location during evaluation: PACU Anesthesia Type: General Level of consciousness: awake and alert Pain management: pain level controlled Vital Signs Assessment: post-procedure vital signs reviewed and stable Respiratory status: spontaneous breathing, nonlabored ventilation, respiratory function stable and patient connected to nasal cannula oxygen Cardiovascular status: blood pressure returned to baseline and stable Postop Assessment: no apparent nausea or vomiting Anesthetic complications: no   No notable events documented.  Last Vitals:  Vitals:   04/20/24 1915 04/20/24 1930  BP: (!) 107/53 (!) 101/51  Pulse: 78 70  Resp: 20 17  Temp:    SpO2: 97% 95%    Last Pain:  Vitals:   04/20/24 1915  TempSrc:   PainSc: 0-No pain                 Lynwood MARLA Cornea

## 2024-04-20 NOTE — Progress Notes (Signed)
  Progress Note    04/20/2024 7:54 AM * No surgery found *  Subjective:  no complaints   Vitals:   04/20/24 0500 04/20/24 0600  BP: (!) 110/54 (!) 103/51  Pulse: 71 75  Resp: 18 17  Temp:    SpO2: 100% 99%   Physical Exam: Lungs:  non labored Extremities:  dressing left in place R ankle Neurologic: A&O  CBC    Component Value Date/Time   WBC 30.2 (H) 04/20/2024 0340   RBC 3.46 (L) 04/20/2024 0340   HGB 9.1 (L) 04/20/2024 0340   HGB 9.6 (L) 02/28/2021 1548   HCT 27.5 (L) 04/20/2024 0340   HCT 30.7 (L) 02/28/2021 1548   PLT 244 04/20/2024 0340   PLT 432 02/28/2021 1548   MCV 79.5 (L) 04/20/2024 0340   MCV 79 02/28/2021 1548   MCH 26.3 04/20/2024 0340   MCHC 33.1 04/20/2024 0340   RDW 19.0 (H) 04/20/2024 0340   RDW 15.0 02/28/2021 1548   LYMPHSABS 0.9 04/19/2024 1341   MONOABS 0.3 04/19/2024 1341   EOSABS 0.0 04/19/2024 1341   BASOSABS 0.0 04/19/2024 1341    BMET    Component Value Date/Time   NA 128 (L) 04/20/2024 0340   NA 137 02/28/2021 1548   K 3.3 (L) 04/20/2024 0340   CL 92 (L) 04/20/2024 0340   CO2 21 (L) 04/20/2024 0340   GLUCOSE 374 (H) 04/20/2024 0340   BUN 55 (H) 04/20/2024 0340   BUN 33 (H) 02/28/2021 1548   CREATININE 6.89 (H) 04/20/2024 0340   CREATININE 3.38 (H) 12/26/2015 1347   CALCIUM  8.1 (L) 04/20/2024 0340   GFRNONAA 6 (L) 04/20/2024 0340   GFRNONAA 13 (L) 01/10/2015 1428   GFRAA 21 (L) 12/21/2019 1802   GFRAA 16 (L) 01/10/2015 1428    INR    Component Value Date/Time   INR 1.1 04/19/2024 1341     Intake/Output Summary (Last 24 hours) at 04/20/2024 0754 Last data filed at 04/20/2024 0600 Gross per 24 hour  Intake 1623.36 ml  Output --  Net 1623.36 ml     Assessment/Plan:  68 y.o. 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  ABIs are pending.  She is aware of her need for  amputation.    Donnice Sender, PA-C [facility] [phone] 04/20/2024 7:54 AM

## 2024-04-20 NOTE — Consult Note (Signed)
 ESRD Consult Note  Requesting provider: Donnice Beals Service requesting consult: CCM Reason for consult: ESRD, provision of dialysis Indication for acute dialysis?: End Stage Renal Disease  Outpatient dialysis unit: [facility] prescription: F180, 350 BFR, EDW 69.5 (weight was 67.4 post HD on 7/25), 4hrs, 3K, 2.5 Ca, RIJ TDC, mircera 100mcg given 7/25, was on IV iron , calcitriol  0.25mcg on home meds list (unclear if taking, no binders  Assessment/Recommendations: [name] is a/an 68 y.o. female with a past medical history notable for ESRD on HD admitted with sepsis c/b shock  # ESRD: Hold HD today given hypotension.  Hopefully dialysis tomorrow once hypotension improves  # Volume/ hypertension: Continue with ultrafiltration as tolerated on dialysis.  At this time limited by hypotension.  Will need lower dry weight at discharge  # Anemia of Chronic Kidney Disease: Hemoglobin 9.1.  Received Mircera on 7/25.  Hold IV iron  for now.  Transfusion if needed  # Secondary Hyperparathyroidism/Hyperphosphatemia: Corrected calcium  normal.  Albumin  very low associated with infection.  Not on any binders but phosphorus at goal  # Vascular access: [facility] with no issues  # Septic shock: Antibiotics and pressors per primary team  # Osteomyelitis: Vascular surgery and orthopedics consulted   # Additional recommendations: - Dose all meds for creatinine clearance < 10 ml/min  - Unless absolutely necessary, no MRIs with gadolinium.  - Implement save arm precautions.  Prefer needle sticks in the dorsum of the hands or wrists.  No blood pressure measurements in arm. - If blood transfusion is requested during hemodialysis sessions, please alert us  prior to the session.  - Use synthetic opioids (Fentanyl /Dilaudid ) if needed  Recommendations were discussed with the primary team.   History of Present Illness: [name] is a/an 67 y.o. female with a past medical history of  ESRD who presents with weakness  Patient was brought to the hospital yesterday.  Prior to arrival the patient had been feeling fine for several days but noted for 24 hours or so that her appetite was poor and she was feeling generally weak.  Denied any fevers or chills.  She had been struggling with a chronic wound on her ankle.  She said dialysis have been going fairly well in the outpatient setting.  She had a catheter exchange on 7/23.  Before arriving in the emergency department she was noted to be hypotensive and received some fluids in the field.  In the emergency department she was noted to have leukocytosis and imaging concerning for osteomyelitis of her ankle.  Patient was continually hypotensive and required pressors and was admitted to the ICU.  She continues on norepinephrine  this morning.  Other than continue to feel tired she denies any other complaints such as chest pain, shortness of breath, nausea, vomiting, diarrhea.   Medications:  Current Facility-Administered Medications  Medication Dose Route Frequency Provider Last Rate Last Admin   acetaminophen  (TYLENOL ) tablet 650 mg  650 mg Oral Q6H PRN Kamat, Sunil G, MD   650 mg at 04/19/24 2323   ceFEPIme  (MAXIPIME ) 1 g in sodium chloride  0.9 % 100 mL IVPB  1 g Intravenous Q24H Gaines Carrier, RPH       Chlorhexidine  Gluconate Cloth 2 % PADS 6 each  6 each Topical Daily Alva, Rakesh V, MD   6 each at 04/20/24 0841   docusate sodium  (COLACE) capsule 100 mg  100 mg Oral BID PRN Alva, Rakesh V, MD       fentaNYL  (SUBLIMAZE ) injection 12.5 mcg  12.5 mcg Intravenous Q4H PRN Kamat, Sunil G, MD   12.5 mcg at 04/19/24 2040   insulin  aspart (novoLOG ) injection 0-9 Units  0-9 Units Subcutaneous Q4H Kamat, Sunil G, MD   7 Units at 04/20/24 0841   insulin  glargine-yfgn (SEMGLEE ) injection 10 Units  10 Units Subcutaneous QHS Kamat, Sunil G, MD   10 Units at 04/20/24 0002   midodrine  (PROAMATINE ) tablet 2.5 mg  2.5 mg Oral TID WC Ula Prentice SAUNDERS, MD    2.5 mg at 04/20/24 9158   norepinephrine  (LEVOPHED ) 4mg  in (0.016 mg/mL) premix infusion  0-40 mcg/min Intravenous Continuous Ula Prentice SAUNDERS, MD 37.5 mL/hr at 04/20/24 0900 10 mcg/min at 04/20/24 0900   Oral care mouth rinse  15 mL Mouth Rinse PRN Jude Harden GAILS, MD       polyethylene glycol (MIRALAX  / GLYCOLAX ) packet 17 g  17 g Oral Daily PRN Jude Harden GAILS, MD       sodium chloride  0.9 % bolus 250 mL  250 mL Intravenous Once Tee, Andrew R, MD       vancomycin  (VANCOREADY) IVPB 750 mg/150 mL  750 mg Intravenous Q M,W,F-HD Gaines Carrier, RPH       vasopressin  (PITRESSIN) 20 Units in 100 mL (0.2 unit/mL) infusion-*FOR SHOCK*  0.03 Units/min Intravenous Continuous Hunsucker, Donnice SAUNDERS, MD 9 mL/hr at 04/20/24 0900 0.03 Units/min at 04/20/24 0900     ALLERGIES Nsaids, Lisinopril, and Peanut-containing drug products  MEDICAL HISTORY Past Medical History:  Diagnosis Date   Anemia    Arthritis    Atrial fibrillation (HCC)    CAD (coronary artery disease)    Chronic kidney disease, stage III (moderate) (HCC)    Constipation    Diabetes mellitus (HCC)    History of blood transfusion    HLD (hyperlipidemia)    Hypertension    Myocardial infarction (HCC)    in April 2014     SOCIAL HISTORY Social History   Socioeconomic History   Marital status: Legally Separated    Spouse name: Not on file   Number of children: 4   Years of education: Not on file   Highest education level: Not on file  Occupational History   Occupation: Disabled   Occupation: disabled  Tobacco Use   Smoking status: Never   Smokeless tobacco: Never  Vaping Use   Vaping status: Never Used  Substance and Sexual Activity   Alcohol  use: No   Drug use: No   Sexual activity: Not Currently  Other Topics Concern   Not on file  Social History Narrative   Patient lives with her son..   Social Drivers of Health   Financial Resource Strain: Low Risk  (08/25/2021)   Received from [facility]   Overall  Financial Resource Strain (CARDIA)    Difficulty of Paying Living Expenses: Not hard at all  Food Insecurity: No Food Insecurity (04/19/2024)   Hunger Vital Sign    Worried About Running Out of Food in the Last Year: Never true    Ran Out of Food in the Last Year: Never true  Transportation Needs: No Transportation Needs (04/19/2024)   PRAPARE - Administrator, Civil Service (Medical): No    Lack of Transportation (Non-Medical): No  Physical Activity: Insufficiently Active (06/20/2021)   [facility]   Exercise Vital Sign    On average, how many days per week do you engage in moderate to strenuous exercise (like a brisk walk)?: 1 day  On average, how many minutes do you engage in exercise at this level?: 20 min  Stress: No Stress Concern Present (08/25/2021)   Received from [facility] Occup[facility] - Occupational Stress Questionnaire    Feeling of Stress : Not at all  Social Connections: Socially Isolated (04/19/2024)   Social Connection and Isolation Panel    Frequency of Communication with Friends and Family: More than three times a week    Frequency of Social Gatherings with Friends and Family: Never    Attends Religious Services: Patient unable to answer    Active Member of Clubs or Organizations: No    Attends Banker Meetings: Never    Marital Status: Separated  Intimate Partner Violence: Not At Risk (04/19/2024)   Humiliation, Afraid, Rape, and Kick questionnaire    Fear of Current or Ex-Partner: No    Emotionally Abused: No    Physically Abused: No    Sexually Abused: No     FAMILY HISTORY Family History  Problem Relation Age of Onset   Diabetes Mother        No history CAD   Colon cancer Mother 62       died at 81 from CRC   Hypertension Father        Also had CAD   Heart disease Father    Cervical cancer Sister    Diabetes Sister    Congestive Heart Failure Sister    Heart disease Maternal  Grandmother    Diabetes Brother    Kidney disease Brother    Sickle cell anemia Son    Thyroid  disease Son    Breast cancer Neg Hx    BRCA 1/2 Neg Hx      Review of Systems: 12 systems were reviewed and negative except per HPI  Physical Exam: Vitals:   04/20/24 0845 04/20/24 0900  BP: (!) 98/46 (!) 132/59  Pulse: 75 75  Resp: 17 20  Temp:    SpO2: 100% 100%   Total I/O In: 88 [I.V.:88] Out: -   Intake/Output Summary (Last 24 hours) at 04/20/2024 0950 Last data filed at 04/20/2024 0900 Gross per 24 hour  Intake 1758.19 ml  Output --  Net 1758.19 ml   General: Tired appearing, no acute distress HEENT: anicteric sclera, MMM CV: normal rate, no rub, right upper extremity 1+ edema Lungs: bilateral chest rise, normal wob Abd: soft, non-tender, non-distended Skin: no visible lesions or rashes Psych: alert, engaged, appropriate mood and affect Neuro: normal speech, no gross focal deficits   Test Results Reviewed Lab Results  Component Value Date   NA 128 (L) 04/20/2024   K 3.3 (L) 04/20/2024   CL 92 (L) 04/20/2024   CO2 21 (L) 04/20/2024   BUN 55 (H) 04/20/2024   CREATININE 6.89 (H) 04/20/2024   GFR 17.34 (L) 04/12/2021   CALCIUM  8.1 (L) 04/20/2024   ALBUMIN  1.5 (L) 04/19/2024   PHOS 3.9 04/20/2024    I have reviewed relevant outside healthcare records

## 2024-04-20 NOTE — Progress Notes (Signed)
 eLink Physician-Brief Progress Note Patient Name: [name] DOB: [birth year] MRN: [id number]   Date of Service  04/20/2024  HPI/Events of Note  Brimonidine  and latanoprost  eye drops were never resumed from home  eICU Interventions  Ordered   0501 - Rt arm edematous w/ musculoskeletal pain in Rt soulder only when moving it.  Laying on her left shoulder at the time of camera evaluation.  Will need in person round evaluation this morning.  Order routine right shoulder x-ray, may need vascular ultrasound pending ground evaluation  Intervention Category Minor Interventions: Routine modifications to care plan (e.g. PRN medications for pain, fever)  [name] 04/20/2024, 9:17 PM

## 2024-04-20 NOTE — Consult Note (Signed)
 [facility]    Date of Admission:  04/19/2024     Total days of antibiotics 1               Reason for Consult: MRSA/Group B Strep Bacteremia Referring Provider: Sharyon / [employer] Primary Care Provider: Campbell Reynolds, NP   ASSESSMENT:  [name] is a 68 y/o AA female with diabetes and s/p ORIF and debridement of the right ankle in 2014 presenting with generalized weakness and concern for infection of the right leg and found to have signs of hypotension with imaging concerning for osteomyelitis and possible necrotizing fasciitis. LRINEC score of 11 indicating high risk of necrotizing fasciitis with plans for emergent below knee amputation today. Discussed plan of care to continue with current dose of vancomycin . Will need dialysis catheter and central line holiday when able given MRSA and Group B Streptococcus bacteremia. Echocardiogram pending. Blood cultures for clearance of bacteremia.  Contact precautions for MRSA bacteremia. MRI Lumbar spine when appropriate. Dialysis per Nephrology. Remaining medical and supportive care per PCCM.   PLAN:  Continue current dose of vancomycin .  Emergent BKA for suspected necrotizing fasciitis per Orthopedics. Blood cultures for clearance of bacteremia. Line holiday for tunneled dialysis catheter and central line when appropriate in the setting of MRSA bacteremia.  Echocardiogram pending  MRI lumbar spine when appropriate.  Contact precautions for MRSA. Dialysis per Nephrology. Remaining medical and supportive care per PCCM.    Principal Problem:   Septic shock (HCC) Active Problems:   Subacute osteomyelitis, right ankle and foot (HCC)   MRSA bacteremia   Bacteremia due to group B Streptococcus    Chlorhexidine  Gluconate Cloth  6 each Topical Daily   [START ON 04/21/2024] Chlorhexidine  Gluconate Cloth  6 each Topical Q0600   heparin  injection (subcutaneous)  5,000 Units Subcutaneous Q8H   insulin  aspart   0-15 Units Subcutaneous Q4H   insulin  glargine-yfgn  20 Units Subcutaneous QHS   midodrine   10 mg Oral TID WC   vancomycin  variable dose per unstable renal function (pharmacist dosing)   Does not apply See admin instructions     HPI: [name] is a 68 y.o. female with previous medical history of ESRD on HD, diabetes, myocardial infarction, hypertension, atrial fibrillation, s/p colostomy, chronic kidney disease, and s/p right ankle ORIF and debridement in 2014 presenting from home with concern for sepsis with open wound of her right foot.  [name] was recently admitted on 04/15/24 for HD access intervention undergoing tunnelled catheter exchange.   Presenting to the hospital with open wound of her right foot and found to be hypotensive, afebrile and with leukocytosis with WBC count of 30,200. Chest x-ray with no active disease. Right ankle x-ray with soft tissue air medial to the ankle as well as large amount of soft tissue gas in the distal calf posterior concerning for possible necrotizing fasciitis. Severe ulceration overlying the distal fibula with possible exposure of the periosteum and fibular plate/screws; and collapse and erosion of the talus and tibial plafond with gas along the joint space and resulting cavities compatible with osteomyelitis and findings are compatible with septic arthritis, osteoarthritis, septic tenosynovitis, and likely necrotizing fasciitis. Started on broad spectrum antibiotics with vancomycin , clindamycin  and cefepime . Blood cultures are positive for MRSA and Group B Streptococcus. Evaluated by Orthopedics with plan for emergent right below knee amputation.  [name] noted a wound on her right leg after bumping it a few months ago that has progressively  worsened since that time. This is the same leg that underwent ORIF and debridement in 2014. Has been feeling generalized weakness and malaise. No fevers. No recent antibiotics. Having increased back pain  recently following a fall.    Review of Systems: Review of Systems  Constitutional:  Positive for malaise/fatigue. Negative for chills, fever and weight loss.  Respiratory:  Negative for cough, shortness of breath and wheezing.   Cardiovascular:  Negative for chest pain.  Gastrointestinal:  Negative for abdominal pain, constipation, diarrhea, nausea and vomiting.  Skin:  Negative for rash.     Past Medical History:  Diagnosis Date   Anemia    Arthritis    Atrial fibrillation (HCC)    CAD (coronary artery disease)    Chronic kidney disease, stage III (moderate) (HCC)    Constipation    Diabetes mellitus (HCC)    History of blood transfusion    HLD (hyperlipidemia)    Hypertension    Myocardial infarction [facility])    in April 2014    Social History   Tobacco Use   Smoking status: Never   Smokeless tobacco: Never  Vaping Use   Vaping status: Never Used  Substance Use Topics   Alcohol  use: No   Drug use: No    Family History  Problem Relation Age of Onset   Diabetes Mother        No history CAD   Colon cancer Mother 78       died at 37 from CRC   Hypertension Father        Also had CAD   Heart disease Father    Cervical cancer Sister    Diabetes Sister    Congestive Heart Failure Sister    Heart disease Maternal Grandmother    Diabetes Brother    Kidney disease Brother    Sickle cell anemia Son    Thyroid  disease Son    Breast cancer Neg Hx    BRCA 1/2 Neg Hx     Allergies  Allergen Reactions   Nsaids Other (See Comments)    CKD stage 4   Lisinopril Other (See Comments)    coughing   Peanut-Containing Drug Products Itching and Other (See Comments)    GI intolerance - diarrhea    OBJECTIVE: Blood pressure (!) 101/56, pulse 80, temperature (!) 97.3 F (36.3 C), temperature source Oral, resp. rate (!) 23, weight 73 kg, SpO2 90%.  Physical Exam Constitutional:      General: She is not in acute distress.    Appearance: She is well-developed. She is  ill-appearing.     Comments: Lying in bed with head of bed elevated  Cardiovascular:     Rate and Rhythm: Normal rate and regular rhythm.     Heart sounds: Normal heart sounds.  Pulmonary:     Effort: Pulmonary effort is normal.     Breath sounds: Normal breath sounds.  Skin:    General: Skin is warm and dry.  Neurological:     Mental Status: She is alert.     Lab Results Lab Results  Component Value Date   WBC 30.2 (H) 04/20/2024   HGB 9.1 (L) 04/20/2024   HCT 27.5 (L) 04/20/2024   MCV 79.5 (L) 04/20/2024   PLT 244 04/20/2024    Lab Results  Component Value Date   CREATININE 6.89 (H) 04/20/2024   BUN 55 (H) 04/20/2024   NA 128 (L) 04/20/2024   K 3.3 (L) 04/20/2024   CL 92 (L) 04/20/2024  CO2 21 (L) 04/20/2024    Lab Results  Component Value Date   ALT 12 04/19/2024   AST 13 (L) 04/19/2024   ALKPHOS 106 04/19/2024   BILITOT 2.0 (H) 04/19/2024     Microbiology: Recent Results (from the past 240 hours)  Resp panel by RT-PCR (RSV, Flu A&B, Covid) Anterior Nasal Swab     Status: None   Collection Time: 04/19/24  1:41 PM   Specimen: Anterior Nasal Swab  Result Value Ref Range Status   SARS Coronavirus 2 by RT PCR NEGATIVE NEGATIVE Final   Influenza A by PCR NEGATIVE NEGATIVE Final   Influenza B by PCR NEGATIVE NEGATIVE Final    Comment: (NOTE) The Xpert Xpress SARS-CoV-2/FLU/RSV plus assay is intended as an aid in the diagnosis of influenza from Nasopharyngeal swab specimens and should not be used as a sole basis for treatment. Nasal washings and aspirates are unacceptable for Xpert Xpress SARS-CoV-2/FLU/RSV testing.  Fact Sheet for Patients: BloggerCourse.com  Fact Sheet for Healthcare Providers: SeriousBroker.it  This test is not yet approved or cleared by the [place]  FDA and has been authorized for detection and/or diagnosis of SARS-CoV-2 by FDA under an Emergency Use Authorization (EUA). This EUA  will remain in effect (meaning this test can be used) for the duration of the COVID-19 declaration under Section 564(b)(1) of the Act, 21 U.S.C. section 360bbb-3(b)(1), unless the authorization is terminated or revoked.     Resp Syncytial Virus by PCR NEGATIVE NEGATIVE Final    Comment: (NOTE) Fact Sheet for Patients: BloggerCourse.com  Fact Sheet for Healthcare Providers: SeriousBroker.it  This test is not yet approved or cleared by the [place]  FDA and has been authorized for detection and/or diagnosis of SARS-CoV-2 by FDA under an Emergency Use Authorization (EUA). This EUA will remain in effect (meaning this test can be used) for the duration of the COVID-19 declaration under Section 564(b)(1) of the Act, 21 U.S.C. section 360bbb-3(b)(1), unless the authorization is terminated or revoked.  Performed at [facility], [address] [place], [place] [address]   Blood Culture (routine x 2)     Status: None (Preliminary result)   Collection Time: 04/19/24  1:41 PM   Specimen: BLOOD  Result Value Ref Range Status   Specimen Description BLOOD LEFT ANTECUBITAL  Final   Special Requests   Final    BOTTLES DRAWN AEROBIC AND ANAEROBIC Blood Culture results may not be optimal due to an inadequate volume of blood received in culture bottles   Culture  Setup Time   Final    GRAM POSITIVE COCCI IN BOTH AEROBIC AND ANAEROBIC BOTTLES CRITICAL VALUE NOTED.  VALUE IS CONSISTENT WITH PREVIOUSLY REPORTED AND CALLED VALUE. Performed at [facility], [address], [place], [place] [address]    Culture GRAM POSITIVE COCCI  Final   Report Status PENDING  Incomplete  Blood Culture (routine x 2)     Status: None (Preliminary result)   Collection Time: 04/19/24  1:46 PM   Specimen: BLOOD  Result Value Ref Range Status   Specimen Description BLOOD RIGHT ANTECUBITAL  Final   Special Requests   Final    BOTTLES DRAWN AEROBIC  AND ANAEROBIC Blood Culture results may not be optimal due to an inadequate volume of blood received in culture bottles   Culture  Setup Time   Final    GRAM POSITIVE COCCI IN BOTH AEROBIC AND ANAEROBIC BOTTLES CRITICAL RESULT CALLED TO, READ BACK BY AND VERIFIED WITH: PHARMD G  ABBOTT 04/20/2024 @ 0539 BY AB Performed at [facility], [address], [place], [place] [address]    Culture GRAM POSITIVE COCCI  Final   Report Status PENDING  Incomplete  Blood Culture ID Panel (Reflexed)     Status: Abnormal   Collection Time: 04/19/24  1:46 PM  Result Value Ref Range Status   Enterococcus faecalis NOT DETECTED NOT DETECTED Final   Enterococcus Faecium NOT DETECTED NOT DETECTED Final   Listeria monocytogenes NOT DETECTED NOT DETECTED Final   Staphylococcus species DETECTED (A) NOT DETECTED Final    Comment: CRITICAL RESULT CALLED TO, READ BACK BY AND VERIFIED WITH: PHARMD G ABBOTT 04/20/2024 @ 0539 BY AB    Staphylococcus aureus (BCID) DETECTED (A) NOT DETECTED Final    Comment: Methicillin (oxacillin)-resistant Staphylococcus aureus (MRSA). MRSA is predictably resistant to beta-lactam antibiotics (except ceftaroline). Preferred therapy is vancomycin  unless clinically contraindicated. Patient requires contact precautions if  hospitalized. CRITICAL RESULT CALLED TO, READ BACK BY AND VERIFIED WITH: PHARMD G ABBOTT 04/20/2024 @ 0539 BY AB    Staphylococcus epidermidis NOT DETECTED NOT DETECTED Final   Staphylococcus lugdunensis NOT DETECTED NOT DETECTED Final   Streptococcus species DETECTED (A) NOT DETECTED Final    Comment: CRITICAL RESULT CALLED TO, READ BACK BY AND VERIFIED WITH: PHARMD G ABBOTT 04/20/2024 @ 0539 BY AB    Streptococcus agalactiae DETECTED (A) NOT DETECTED Final    Comment: CRITICAL RESULT CALLED TO, READ BACK BY AND VERIFIED WITH: PHARMD G ABBOTT 04/20/2024 @ 0539 BY AB    Streptococcus pneumoniae NOT DETECTED NOT DETECTED Final   Streptococcus pyogenes NOT  DETECTED NOT DETECTED Final   A.calcoaceticus-baumannii NOT DETECTED NOT DETECTED Final   Bacteroides fragilis NOT DETECTED NOT DETECTED Final   Enterobacterales NOT DETECTED NOT DETECTED Final   Enterobacter cloacae complex NOT DETECTED NOT DETECTED Final   Escherichia coli NOT DETECTED NOT DETECTED Final   Klebsiella aerogenes NOT DETECTED NOT DETECTED Final   Klebsiella oxytoca NOT DETECTED NOT DETECTED Final   Klebsiella pneumoniae NOT DETECTED NOT DETECTED Final   Proteus species NOT DETECTED NOT DETECTED Final   Salmonella species NOT DETECTED NOT DETECTED Final   Serratia marcescens NOT DETECTED NOT DETECTED Final   Haemophilus influenzae NOT DETECTED NOT DETECTED Final   Neisseria meningitidis NOT DETECTED NOT DETECTED Final   Pseudomonas aeruginosa NOT DETECTED NOT DETECTED Final   Stenotrophomonas maltophilia NOT DETECTED NOT DETECTED Final   Candida albicans NOT DETECTED NOT DETECTED Final   Candida auris NOT DETECTED NOT DETECTED Final   Candida glabrata NOT DETECTED NOT DETECTED Final   Candida krusei NOT DETECTED NOT DETECTED Final   Candida parapsilosis NOT DETECTED NOT DETECTED Final   Candida tropicalis NOT DETECTED NOT DETECTED Final   Cryptococcus neoformans/gattii NOT DETECTED NOT DETECTED Final   Meth resistant mecA/C and MREJ DETECTED (A) NOT DETECTED Final    Comment: CRITICAL RESULT CALLED TO, READ BACK BY AND VERIFIED WITH: PHARMD G ABBOTT 04/20/2024 @ 0539 BY AB Performed at [facility], [address] [place], [place] [address]   MRSA Next Gen by PCR, Nasal     Status: None   Collection Time: 04/19/24  7:17 PM   Specimen: Nasal Mucosa; Nasal Swab  Result Value Ref Range Status   MRSA by PCR Next Gen NOT DETECTED NOT DETECTED Final    Comment: (NOTE) The GeneXpert MRSA Assay (FDA approved for NASAL specimens only), is one component of a comprehensive MRSA colonization surveillance program. It is  not intended to diagnose MRSA infection nor to  guide or monitor treatment for MRSA infections. Test performance is not FDA approved in patients less than 53 years old. Performed at [facility], [address] [place], [place] [address]      Cathlyn July, NP [facility]  04/20/2024  4:03 PM

## 2024-04-20 NOTE — Inpatient Diabetes Management (Signed)
 Inpatient Diabetes Program Recommendations  AACE/ADA: New Consensus Statement on Inpatient Glycemic Control (2015)  Target Ranges:  Prepandial:   less than 140 mg/dL      Peak postprandial:   less than 180 mg/dL (1-2 hours)      Critically ill patients:  140 - 180 mg/dL   Lab Results  Component Value Date   GLUCAP 304 (H) 04/20/2024   HGBA1C 6.6 (H) 10/28/2023    Review of Glycemic Control  Latest Reference Range & Units 04/20/24 01:01 04/20/24 03:34 04/20/24 08:14  Glucose-Capillary 70 - 99 mg/dL 611 (H) 619 (H) 695 (H)   Diabetes history: DM 2 Outpatient Diabetes medications:  Novolog  0-9 units tid with meals Lantus  7 units daily Current orders for Inpatient glycemic control:  Novolog  0-15 units q 4 hours Semglee  20 units daily Inpatient Diabetes Program Recommendations:   Note elevated CBG's. Adjustments made today in insulins.   Thanks,  Randall Bullocks, RN, BC-ADM Inpatient Diabetes Coordinator Pager [phone]  (8a-5p)

## 2024-04-20 NOTE — Anesthesia Procedure Notes (Signed)
 Procedure Name: Intubation Date/Time: 04/20/2024 6:03 PM  Performed by: Lockie Flesher, CRNAPre-anesthesia Checklist: Patient identified, Emergency Drugs available, Suction available and Patient being monitored Patient Re-evaluated:Patient Re-evaluated prior to induction Oxygen Delivery Method: Circle System Utilized Preoxygenation: Pre-oxygenation with 100% oxygen Induction Type: IV induction Ventilation: Mask ventilation without difficulty Laryngoscope Size: Mac and 3 Grade View: Grade II Tube type: Oral Tube size: 7.0 mm Number of attempts: 1 Airway Equipment and Method: Stylet and Oral airway Placement Confirmation: ETT inserted through vocal cords under direct vision, positive ETCO2 and breath sounds checked- equal and bilateral Secured at: 23 cm Tube secured with: Tape Dental Injury: Teeth and Oropharynx as per pre-operative assessment

## 2024-04-20 NOTE — Consult Note (Addendum)
 ORTHOPAEDIC CONSULTATION  REQUESTING PHYSICIAN: Hunsucker, Donnice SAUNDERS, MD  Chief Complaint: 1-1/61-month history of necrotic ulcer lateral right ankle.  HPI: [name] is a 68 y.o. female who presents with necrotic ulcer with purulent drainage lateral right ankle.  Patient states the ulcer has been present for a month and a half.  Patient has past medical history including diabetes end-stage renal disease on dialysis.  Patient states she has been following up with podiatry for routine wound care.  Patient is status post open reduction internal fixation of the right ankle in April 2014 and status post wound debridement in May 2014.  Past Medical History:  Diagnosis Date   Anemia    Arthritis    Atrial fibrillation (HCC)    CAD (coronary artery disease)    Chronic kidney disease, stage III (moderate) (HCC)    Constipation    Diabetes mellitus (HCC)    History of blood transfusion    HLD (hyperlipidemia)    Hypertension    Myocardial infarction [facility])    in April 2014   Past Surgical History:  Procedure Laterality Date   ANKLE SURGERY Right    x 6 total   BREAST BIOPSY Left    15 or 20 yearsa ago she cant remember laterality    HARDWARE REMOVAL Right 02/13/2013   Procedure: HARDWARE REMOVAL;  Surgeon: Jerona LULLA Sage, MD;  Location: [facility] OR;  Service: Orthopedics;  Laterality: Right;  Right Ankle Removal Hardware, Debridement, Place Wound VAC   IR FLUORO GUIDE CV LINE LEFT  11/08/2023   IR FLUORO GUIDE CV LINE RIGHT  11/01/2023   IR FLUORO GUIDE CV LINE RIGHT  11/12/2023   IR US  GUIDE VASC ACCESS LEFT  11/08/2023   IR US  GUIDE VASC ACCESS RIGHT  11/01/2023   IR US  GUIDE VASC ACCESS RIGHT  11/12/2023   laser surgery for diabetic retinopathy Bilateral    LEFT HEART CATHETERIZATION WITH CORONARY ANGIOGRAM N/A 01/09/2013   Procedure: LEFT HEART CATHETERIZATION WITH CORONARY ANGIOGRAM;  Surgeon: Toribio SAUNDERS Fuel, MD;  Location: [facility] CATH LAB;  Service: Cardiovascular;  Laterality: N/A;    ORIF ANKLE FRACTURE Right 01/12/2013   Procedure: OPEN REDUCTION INTERNAL FIXATION (ORIF) ANKLE FRACTURE;  Surgeon: Jerona LULLA Sage, MD;  Location: [facility] OR;  Service: Orthopedics;  Laterality: Right;   PARTIAL COLECTOMY N/A 11/02/2023   Procedure: OPEN PARTIAL COLECTOMY WITH END COLOSTOMY;  Surgeon: Dasie Leonor LITTIE, MD;  Location: [facility] OR;  Service: General;  Laterality: N/A;   PARTIAL HYSTERECTOMY     TUNNELLED CATHETER EXCHANGE N/A 04/15/2024   Procedure: TUNNELLED CATHETER EXCHANGE;  Surgeon: Pearline Norman RAMAN, MD;  Location: [facility] LAB;  Service: Cardiovascular;  Laterality: N/A;   Social History   Socioeconomic History   Marital status: Legally Separated    Spouse name: Not on file   Number of children: 4   Years of education: Not on file   Highest education level: Not on file  Occupational History   Occupation: Disabled   Occupation: disabled  Tobacco Use   Smoking status: Never   Smokeless tobacco: Never  Vaping Use   Vaping status: Never Used  Substance and Sexual Activity   Alcohol  use: No   Drug use: No   Sexual activity: Not Currently  Other Topics Concern   Not on file  Social History Narrative   Patient lives with her son..   Social Drivers of Health   Financial Resource Strain: Low Risk  (08/25/2021)   Received from  [facility]   Overall Financial Resource Strain (CARDIA)    Difficulty of Paying Living Expenses: Not hard at all  Food Insecurity: No Food Insecurity (04/19/2024)   Hunger Vital Sign    Worried About Running Out of Food in the Last Year: Never true    Ran Out of Food in the Last Year: Never true  Transportation Needs: No Transportation Needs (04/19/2024)   PRAPARE - Administrator, Civil Service (Medical): No    Lack of Transportation (Non-Medical): No  Physical Activity: Insufficiently Active (06/20/2021)   Received from [facility]   Exercise Vital Sign    On average, how many days per week do you engage in moderate to strenuous  exercise (like a brisk walk)?: 1 day    On average, how many minutes do you engage in exercise at this level?: 20 min  Stress: No Stress Concern Present (08/25/2021)   Received from [facility] - Occupational Stress Questionnaire    Feeling of Stress : Not at all  Social Connections: Socially Isolated (04/19/2024)   Social Connection and Isolation Panel    Frequency of Communication with Friends and Family: More than three times a week    Frequency of Social Gatherings with Friends and Family: Never    Attends Religious Services: Patient unable to answer    Active Member of Clubs or Organizations: No    Attends Banker Meetings: Never    Marital Status: Separated   Family History  Problem Relation Age of Onset   Diabetes Mother        No history CAD   Colon cancer Mother 65       died at 47 from CRC   Hypertension Father        Also had CAD   Heart disease Father    Cervical cancer Sister    Diabetes Sister    Congestive Heart Failure Sister    Heart disease Maternal Grandmother    Diabetes Brother    Kidney disease Brother    Sickle cell anemia Son    Thyroid  disease Son    Breast cancer Neg Hx    BRCA 1/2 Neg Hx    - negative except otherwise stated in the family history section Allergies  Allergen Reactions   Nsaids Other (See Comments)    CKD stage 4   Lisinopril Other (See Comments)    coughing   Peanut-Containing Drug Products Itching and Other (See Comments)    GI intolerance - diarrhea   Prior to Admission medications   Medication Sig Start Date End Date Taking? Authorizing Provider  albuterol  (PROVENTIL ) (2.5 MG/3ML) 0.083% nebulizer solution Take 3 mLs (2.5 mg total) by nebulization every 2 (two) hours as needed for wheezing. 11/25/23  Yes Ghimire, Donalda HERO, MD  apixaban  (ELIQUIS ) 2.5 MG TABS tablet Take 1 tablet (2.5 mg total) by mouth 2 (two) times daily. 10/04/22  Yes Acharya, Gayatri A, MD   brimonidine  (ALPHAGAN ) 0.2 % ophthalmic solution Place 1 drop into both eyes 2 (two) times daily.   Yes [provider]  calcitRIOL  (ROCALTROL ) 0.25 MCG capsule TAKE 1 CAPSULE (0.25 MCG TOTAL) EVERY MONDAY, WEDNESDAY, AND FRIDAY Patient taking differently: Take 0.25 mcg by mouth in the morning and at bedtime. 06/22/22  Yes Christia Budds, MD  carvedilol  (COREG ) 12.5 MG tablet TAKE 1 TABLET (12.5 MG TOTAL) BY MOUTH 2 (TWO) TIMES DAILY WITH A MEAL. 12/22/21  Yes Autry-Lott, Rojean, DO  insulin  glargine (LANTUS ) 100 UNIT/ML injection Inject 0.07 mLs (7 Units total) into the skin daily. Please schedule appointment before next refill. Patient taking differently: Inject 10 Units into the skin daily. Please schedule appointment before next refill. 02/01/24  Yes [name], MD  latanoprost  (XALATAN ) 0.005 % ophthalmic solution Place 1 drop into both eyes at bedtime.   Yes [provider]  melatonin 5 MG TABS Take 10 mg by mouth at bedtime as needed (for insomnia).   Yes [provider]  midodrine  (PROAMATINE ) 2.5 MG tablet Take 2.5 mg by mouth.   Yes [provider]  Multiple Vitamin (MULTIVITAMIN WITH MINERALS) TABS Take 1 tablet by mouth daily.   Yes [provider]  NOVOLIN R 100 UNIT/ML injection Inject 0-9 Units into the skin in the morning, at noon, and at bedtime. Per sliding scale 04/03/24  Yes [provider]  ACCU-CHEK AVIVA PLUS test strip CHECK BLOOD GLUCOSE THREE TIMES DAILY 11/05/22   Joshua Domino, DO  amLODipine  (NORVASC ) 5 MG tablet Take 0.5 tablets (2.5 mg total) by mouth at bedtime. Patient not taking: Reported on 03/26/2024 02/01/24 03/26/24  [name], MD  Blood Glucose Monitoring Suppl (ACCU-CHEK AVIVA PLUS) w/Device KIT CHECK BLOOD GLUCOSE THREE TIMES DAILY 10/26/20   Autry-Lott, Rojean, DO  collagenase  (SANTYL ) 250 UNIT/GM ointment Apply 1 Application topically daily. Apply nickel thick amount to ulcer Right lateral ankle 3  x 2 cm with muscle/fascia capsule exposed Patient not taking: Reported on 04/19/2024 04/15/24 05/15/24  Lamount Ethan CROME, DPM  DROPLET INSULIN  SYRINGE 31G X 5/16 0.5 ML MISC USE FOUR TIMES DAILY FOR INJECTIONS 09/03/22   Joshua Domino, DO  furosemide  (LASIX ) 40 MG tablet Take 40 mg by mouth 2 (two) times daily. Patient not taking: Reported on 04/19/2024    [provider]  insulin  aspart (NOVOLOG  FLEXPEN) 100 UNIT/ML FlexPen 0-9 Units, Subcutaneous, 3 times daily with meals CBG < 70: Implement Hypoglycemia measures CBG 70 - 120: 0 units CBG 121 - 150: 1 unit CBG 151 - 200: 2 units CBG 201 - 250: 3 units CBG 251 - 300: 5 units CBG 301 - 350: 7 units CBG 351 - 400: 9 units CBG > 400: call MD Patient not taking: Reported on 04/19/2024 11/25/23   Raenelle Donalda HERO, MD  Insulin  Syringes, Disposable, U-100 0.5 ML MISC 4x daily injections 03/21/18   Riccio, Angela C, DO  Lancet Devices (ACCU-CHEK SOFTCLIX) lancets Fill for 1 month for TID testing. Use as instructed 11/13/13   Curtis Hadassah DASEN, MD  NEEDLE, DISP, 30 G (BD DISP NEEDLES) 30G X 1/2 MISC For 4x daily injections 11/05/15   Phelps, Jazma Y, DO  REPATHA SURECLICK 140 MG/ML SOAJ Inject 140 mg into the skin every 14 (fourteen) days. Patient not taking: Reported on 03/26/2024 09/16/23   [provider]  VITAMIN D , CHOLECALCIFEROL, PO Take 1 tablet by mouth daily. Patient not taking: Reported on 04/19/2024    [provider]   CT ANKLE RIGHT W CONTRAST Result Date: 04/19/2024 CLINICAL DATA:  Necrotizing fasciitis EXAM: CT OF THE RIGHT ANKLE WITH CONTRAST TECHNIQUE: Multidetector CT imaging of the right ankle was performed following the standard protocol during bolus administration of intravenous contrast. RADIATION DOSE REDUCTION: This exam was performed according to the departmental dose-optimization program which includes automated exposure control, adjustment of the mA and/or kV according to patient size and/or use of iterative  reconstruction technique. CONTRAST:  75mL OMNIPAQUE  IOHEXOL  350 MG/ML SOLN COMPARISON:  04/19/2024 radiographs  FINDINGS: Bones/Joint/Cartilage Lateral plate and screw fixator in the distal fibula with a long transverse screw extending into the tibia. Collapse and erosion of the talus and tibial plafond and with gas along the joint space and resulting cavities in this region. Substantial erosions or cystic lesions along the distal fibular tip, the distal tibial margin, and the remaining flattened talus. Gas in the talonavicular articulation, sinus tarsi, and subtalar joints. Gas in the calcaneocuboid joint, midfoot joints, and Lisfranc joint. Hemangioma or lipoma in the posterior calcaneus. Ligaments Suboptimally assessed by CT. Muscles and Tendons Gas densities track within along the distal tibialis posterior muscle and tendon at the level of the distal diaphysis and distally, along with a small amount of gas density posterior to the tibiofibular syndesmosis. Gas tracks in the common peroneus tendon sheath and along the peroneus longus. Thickened medial band of the plantar fascia. Muscular atrophy in the distal calf. Soft tissues Severe ulceration overlying the distal fibula with possible exposure of the periosteum and fibular plate/screws for example on image 117 series 5. Subcutaneous edema tracks along the plantar hindfoot and dorsal forefoot. IMPRESSION: 1. Severe ulceration overlying the distal fibula with possible exposure of the periosteum and fibular plate/screws. 2. Collapse and erosion of the talus and tibial plafond with gas along the joint space and resulting cavities in this region compatible with osteomyelitis. Substantial erosions or cystic lesions along the distal fibular tip, the distal tibial margin, and the remaining flattened talus. Gas in the resulting tibiotalar cavity, talonavicular articulation, sinus tarsi, subtalar joints, calcaneocuboid joint, midfoot joints, and Lisfranc joint. Gas also  tracks along various tendon groups and adjacent to the tibiofibular syndesmosis. 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 3. Subcutaneous edema tracks along the plantar hindfoot and dorsal forefoot. 4. Thickened medial band of the plantar fascia. 5. Muscular atrophy in the distal calf. Electronically Signed   By: Ryan Salvage M.D.   On: 04/19/2024 19:14   DG Chest Portable 1 View Result Date: 04/19/2024 CLINICAL DATA:  Sepsis.  Central line placement. EXAM: PORTABLE CHEST 1 VIEW COMPARISON:  04/19/2024 FINDINGS: New left jugular central venous catheter is seen with tip overlying the SVC. No pneumothorax visualized. Right jugular dual-lumen central venous dialysis catheter remains in appropriate position. Heart size is normal.  Both lungs are clear. IMPRESSION: New left jugular central venous catheter in appropriate position. No pneumothorax visualized. No active lung disease. Electronically Signed   By: Norleen DELENA Kil M.D.   On: 04/19/2024 18:31   DG Ankle Complete Right Result Date: 04/19/2024 CLINICAL DATA:  Evaluate for osteomyelitis. EXAM: RIGHT ANKLE - COMPLETE 3+ VIEW COMPARISON:  Right ankle radiograph dated 03/05/2024 FINDINGS: Distal tibia and fibular fixation. Fragmentation of the tip of the distal fibula new since the prior radiograph and suspicious for osteomyelitis. There is lucency adjacent to the lowermost fibular screw likely representing loosening. Degenerative changes and chronic deformity and fragmentation of the ankle. There is soft tissue air medial to the ankle as well as large amount of soft tissue gas in the distal calf posteriorly. In the absence of recent procedure findings concerning for an infectious process and possibly necrotizing fasciitis. Clinical correlation recommended. Dressing noted over the lateral ankle. IMPRESSION: 1. Fragmentation of the tip of the distal fibula new since the prior  radiograph and suspicious for osteomyelitis. 2. Soft tissue air medial to the ankle as well as large amount of soft tissue gas in the distal calf posteriorly. In the absence of recent  procedure findings concerning for an infectious process and possibly necrotizing fasciitis. Electronically Signed   By: Vanetta Chou M.D.   On: 04/19/2024 15:11   DG Chest Port 1 View Result Date: 04/19/2024 CLINICAL DATA:  Questionable sepsis. EXAM: PORTABLE CHEST 1 VIEW COMPARISON:  Chest CT dated 11/03/2023 FINDINGS: Dialysis catheter with tip at the cavoatrial junction. No focal consolidation, pleural effusion, or pneumothorax. Top-normal cardiac size. No acute osseous pathology. IMPRESSION: No active disease. Electronically Signed   By: Vanetta Chou M.D.   On: 04/19/2024 14:16   - pertinent xrays, CT, MRI studies were reviewed and independently interpreted  Positive ROS: All other systems have been reviewed and were otherwise negative with the exception of those mentioned in the HPI and as above.  Physical Exam: General: Alert, no acute distress Psychiatric: Patient is competent for consent with normal mood and affect Lymphatic: No axillary or cervical lymphadenopathy Cardiovascular: No pedal edema Respiratory: No cyanosis, no use of accessory musculature GI: No organomegaly, abdomen is soft and non-tender    Images:  @ENCIMAGES @  Labs:  Lab Results  Component Value Date   HGBA1C 6.6 (H) 10/28/2023   HGBA1C 6.4 02/28/2021   HGBA1C 6.6 06/16/2020   ESRSEDRATE 83 (H) 07/25/2011   CRP 21.9 (H) 04/19/2024   CRP 12.1 (H) 11/10/2023   CRP 13.7 (H) 11/09/2023   REPTSTATUS PENDING 04/19/2024   CULT GRAM POSITIVE COCCI 04/19/2024   LABORGA KLEBSIELLA PNEUMONIAE 07/25/2011    Lab Results  Component Value Date   ALBUMIN  1.5 (L) 04/19/2024   ALBUMIN  2.3 (L) 03/03/2024   ALBUMIN  2.1 (L) 11/22/2023        Latest Ref Rng & Units 04/20/2024    3:40 AM 04/19/2024    1:41 PM 03/03/2024    1:51  AM  CBC EXTENDED  WBC 4.0 - 10.5 K/uL 30.2  28.5  9.8   RBC 3.87 - 5.11 MIL/uL 3.46  3.53  3.92   Hemoglobin 12.0 - 15.0 g/dL 9.1  9.4  89.7   HCT 63.9 - 46.0 % 27.5  29.2  34.5   Platelets 150 - 400 K/uL 244  225  157   NEUT# 1.7 - 7.7 K/uL  27.4    Lymph# 0.7 - 4.0 K/uL  0.9      Neurologic: Patient does not have protective sensation bilateral lower extremities.   MUSCULOSKELETAL:   Skin: Examination patient has an ischemic ulcer over the dorsum of the right foot.  There is a large necrotic ulcer that extends down to bone over the lateral malleolar ankle.  Patient has a faint palpable dorsalis pedis pulse on the left.  I cannot palpate a pulse on the right.  Laboratory values show a sodium of 128.  Creatinine 6.89.  CRP 21.9.  White cell count 30.2 glucose 374.  C-reactive protein 21.   LRINEC score 13 this is the maximum score.  Review of the radiographs shows lytic changes around the screws with destructive changes of the tibiotalar joint.  The CT scan shows extensive air in the ankle and soft tissue around the ankle and hindfoot.  There is air in the soft tissue proximal to the ankle.   Assessment: Assessment: Advanced infection of the right hindfoot involving the tibiotalar joint hindfoot and necrotic ulceration over the fibula.  Air in the soft tissue extending up the calf.  Patient with high risk for necrotizing fasciitis.  Plan: Plan: Will order ankle-brachial indices to have values for the left lower extremity.  Discussed with patient  the recommendation to proceed with a transtibial amputation on the right.  With the infection extending up the calf patient may need additional surgery on Wednesday.  I also called her son and with the advanced infection findings have recommended proceeding with urgent surgery today and possible revision surgery on Wednesday.  Discussed that this is a life-threatening infection.    Thank you for the consult and the opportunity to see Ms.  Florian Jerona Sage, MD [facility] [phone] 7:51 AM

## 2024-04-20 NOTE — Progress Notes (Signed)
 PHARMACY - PHYSICIAN COMMUNICATION CRITICAL VALUE ALERT - BLOOD CULTURE IDENTIFICATION (BCID)  [name] is an 68 y.o. female who presented to [facility] on [facility]/19/2024 with a chief complaint of AMS/sepsis  Assessment:   Blood culture growing MRSA and Group B Strep  Name of physician (or Provider) Contacted:  Dr. Cheryll  Current antibiotics:  Vancomycin  and Cefepime    Changes to prescribed antibiotics recommended:  No changes needed at this time  Results for orders placed or performed during the hospital encounter of 04/19/24  Blood Culture ID Panel (Reflexed) (Collected: 04/19/2024  1:46 PM)  Result Value Ref Range   Enterococcus faecalis NOT DETECTED NOT DETECTED   Enterococcus Faecium NOT DETECTED NOT DETECTED   Listeria monocytogenes NOT DETECTED NOT DETECTED   Staphylococcus species DETECTED (A) NOT DETECTED   Staphylococcus aureus (BCID) DETECTED (A) NOT DETECTED   Staphylococcus epidermidis NOT DETECTED NOT DETECTED   Staphylococcus lugdunensis NOT DETECTED NOT DETECTED   Streptococcus species DETECTED (A) NOT DETECTED   Streptococcus agalactiae DETECTED (A) NOT DETECTED   Streptococcus pneumoniae NOT DETECTED NOT DETECTED   Streptococcus pyogenes NOT DETECTED NOT DETECTED   A.calcoaceticus-baumannii NOT DETECTED NOT DETECTED   Bacteroides fragilis NOT DETECTED NOT DETECTED   Enterobacterales NOT DETECTED NOT DETECTED   Enterobacter cloacae complex NOT DETECTED NOT DETECTED   Escherichia coli NOT DETECTED NOT DETECTED   Klebsiella aerogenes NOT DETECTED NOT DETECTED   Klebsiella oxytoca NOT DETECTED NOT DETECTED   Klebsiella pneumoniae NOT DETECTED NOT DETECTED   Proteus species NOT DETECTED NOT DETECTED   Salmonella species NOT DETECTED NOT DETECTED   Serratia marcescens NOT DETECTED NOT DETECTED   Haemophilus influenzae NOT DETECTED NOT DETECTED   Neisseria meningitidis NOT DETECTED NOT DETECTED   Pseudomonas aeruginosa NOT DETECTED NOT DETECTED    Stenotrophomonas maltophilia NOT DETECTED NOT DETECTED   Candida albicans NOT DETECTED NOT DETECTED   Candida auris NOT DETECTED NOT DETECTED   Candida glabrata NOT DETECTED NOT DETECTED   Candida krusei NOT DETECTED NOT DETECTED   Candida parapsilosis NOT DETECTED NOT DETECTED   Candida tropicalis NOT DETECTED NOT DETECTED   Cryptococcus neoformans/gattii NOT DETECTED NOT DETECTED   Meth resistant mecA/C and MREJ DETECTED (A) NOT DETECTED    [name] 04/20/2024  6:02 AM

## 2024-04-21 ENCOUNTER — Encounter (HOSPITAL_COMMUNITY): Payer: Self-pay | Admitting: *Deleted

## 2024-04-21 ENCOUNTER — Ambulatory Visit (HOSPITAL_COMMUNITY): Admitting: Nurse Practitioner

## 2024-04-21 ENCOUNTER — Inpatient Hospital Stay (HOSPITAL_COMMUNITY)

## 2024-04-21 ENCOUNTER — Other Ambulatory Visit: Payer: Self-pay

## 2024-04-21 ENCOUNTER — Encounter (HOSPITAL_COMMUNITY): Payer: Self-pay | Admitting: Orthopedic Surgery

## 2024-04-21 DIAGNOSIS — A419 Sepsis, unspecified organism: Secondary | ICD-10-CM | POA: Diagnosis not present

## 2024-04-21 DIAGNOSIS — R6521 Severe sepsis with septic shock: Secondary | ICD-10-CM | POA: Diagnosis not present

## 2024-04-21 DIAGNOSIS — M86071 Acute hematogenous osteomyelitis, right ankle and foot: Secondary | ICD-10-CM | POA: Diagnosis not present

## 2024-04-21 HISTORY — PX: IR REMOVAL TUN CV CATH W/O FL: IMG2289

## 2024-04-21 LAB — CBC
HCT: 23.7 % — ABNORMAL LOW (ref 36.0–46.0)
Hemoglobin: 7.8 g/dL — ABNORMAL LOW (ref 12.0–15.0)
MCH: 25.9 pg — ABNORMAL LOW (ref 26.0–34.0)
MCHC: 32.9 g/dL (ref 30.0–36.0)
MCV: 78.7 fL — ABNORMAL LOW (ref 80.0–100.0)
Platelets: 156 K/uL (ref 150–400)
RBC: 3.01 MIL/uL — ABNORMAL LOW (ref 3.87–5.11)
RDW: 18.8 % — ABNORMAL HIGH (ref 11.5–15.5)
WBC: 19.1 K/uL — ABNORMAL HIGH (ref 4.0–10.5)
nRBC: 0.1 % (ref 0.0–0.2)

## 2024-04-21 LAB — GLUCOSE, CAPILLARY
Glucose-Capillary: 155 mg/dL — ABNORMAL HIGH (ref 70–99)
Glucose-Capillary: 92 mg/dL (ref 70–99)
Glucose-Capillary: 94 mg/dL (ref 70–99)
Glucose-Capillary: 159 mg/dL — ABNORMAL HIGH (ref 70–99)
Glucose-Capillary: 147 mg/dL — ABNORMAL HIGH (ref 70–99)
Glucose-Capillary: 158 mg/dL — ABNORMAL HIGH (ref 70–99)

## 2024-04-21 LAB — PHOSPHORUS: Phosphorus: 4.2 mg/dL (ref 2.5–4.6)

## 2024-04-21 LAB — BASIC METABOLIC PANEL WITH GFR
Anion gap: 11 (ref 5–15)
BUN: 58 mg/dL — ABNORMAL HIGH (ref 8–23)
CO2: 22 mmol/L (ref 22–32)
Calcium: 8 mg/dL — ABNORMAL LOW (ref 8.9–10.3)
Chloride: 95 mmol/L — ABNORMAL LOW (ref 98–111)
Creatinine, Ser: 6.86 mg/dL — ABNORMAL HIGH (ref 0.44–1.00)
GFR, Estimated: 6 mL/min — ABNORMAL LOW (ref >60)
Glucose, Bld: 124 mg/dL — ABNORMAL HIGH (ref 70–99)
Potassium: 3.4 mmol/L — ABNORMAL LOW (ref 3.5–5.1)
Sodium: 128 mmol/L — ABNORMAL LOW (ref 135–145)

## 2024-04-21 LAB — MAGNESIUM: Magnesium: 2 mg/dL (ref 1.7–2.4)

## 2024-04-21 LAB — VANCOMYCIN, RANDOM: Vancomycin Rm: 15 ug/mL

## 2024-04-21 MED ORDER — LIDOCAINE-EPINEPHRINE 1 %-1:100000 IJ SOLN
INTRAMUSCULAR | Status: AC
  Filled 2024-04-21: qty 1

## 2024-04-21 MED ORDER — PROMETHAZINE HCL 12.5 MG PO TABS
12.5000 mg | ORAL_TABLET | Freq: Once | ORAL | Status: AC
  Administered 2024-04-21: 12.5 mg via ORAL
  Filled 2024-04-21: qty 1

## 2024-04-21 MED ORDER — HEPARIN SODIUM (PORCINE) 1000 UNIT/ML IJ SOLN
INTRAMUSCULAR | Status: AC
  Filled 2024-04-21: qty 4

## 2024-04-21 MED ORDER — APIXABAN 2.5 MG PO TABS
2.5000 mg | ORAL_TABLET | Freq: Two times a day (BID) | ORAL | Status: DC
  Administered 2024-04-21 (×2): 2.5 mg via ORAL
  Filled 2024-04-21 (×2): qty 1

## 2024-04-21 MED ORDER — FENTANYL CITRATE PF 50 MCG/ML IJ SOSY
12.5000 ug | PREFILLED_SYRINGE | INTRAMUSCULAR | Status: DC | PRN
  Administered 2024-04-22 – 2024-04-26 (×2): 12.5 ug via INTRAVENOUS
  Filled 2024-04-21 (×2): qty 1

## 2024-04-21 MED ORDER — ONDANSETRON HCL 4 MG PO TABS
8.0000 mg | ORAL_TABLET | Freq: Once | ORAL | Status: AC
  Administered 2024-04-21: 8 mg via ORAL
  Filled 2024-04-21: qty 2

## 2024-04-21 MED ORDER — LIDOCAINE HCL (PF) 1 % IJ SOLN: 5.0000 mL | INTRAMUSCULAR | Status: DC | PRN

## 2024-04-21 MED ORDER — ALTEPLASE 2 MG IJ SOLR
INTRAMUSCULAR | Status: AC
  Filled 2024-04-21: qty 4

## 2024-04-21 MED ORDER — INSULIN ASPART 100 UNIT/ML IJ SOLN
0.0000 [IU] | Freq: Three times a day (TID) | INTRAMUSCULAR | Status: DC
  Administered 2024-04-21: 3 [IU] via SUBCUTANEOUS
  Administered 2024-04-22: 2 [IU] via SUBCUTANEOUS

## 2024-04-21 MED ORDER — LIDOCAINE-EPINEPHRINE 1 %-1:100000 IJ SOLN
20.0000 mL | Freq: Once | INTRAMUSCULAR | Status: AC
  Administered 2024-04-21: 10 mL

## 2024-04-21 MED ORDER — OXYCODONE HCL 5 MG PO TABS
5.0000 mg | ORAL_TABLET | Freq: Four times a day (QID) | ORAL | Status: DC | PRN
  Administered 2024-04-21 – 2024-04-28 (×14): 5 mg via ORAL
  Filled 2024-04-21 (×14): qty 1

## 2024-04-21 MED ORDER — ACETAMINOPHEN 325 MG PO TABS
650.0000 mg | ORAL_TABLET | Freq: Four times a day (QID) | ORAL | Status: DC | PRN
  Administered 2024-04-21 – 2024-04-27 (×2): 650 mg via ORAL
  Filled 2024-04-21 (×3): qty 2

## 2024-04-21 MED ORDER — PENTAFLUOROPROP-TETRAFLUOROETH EX AERO
1.0000 | INHALATION_SPRAY | CUTANEOUS | Status: DC | PRN
Start: 2024-04-21 — End: 2024-04-25

## 2024-04-21 MED ORDER — HEPARIN SODIUM (PORCINE) 1000 UNIT/ML DIALYSIS
1000.0000 [IU] | INTRAMUSCULAR | Status: DC | PRN
  Administered 2024-04-25: 3800 [IU] via INTRAVENOUS_CENTRAL
  Filled 2024-04-21: qty 1

## 2024-04-21 MED ORDER — LIDOCAINE-PRILOCAINE 2.5-2.5 % EX CREA: 1.0000 | TOPICAL_CREAM | CUTANEOUS | Status: DC | PRN

## 2024-04-21 MED ORDER — ALTEPLASE 2 MG IJ SOLR
2.0000 mg | Freq: Once | INTRAMUSCULAR | Status: AC | PRN
  Administered 2024-04-21: 4 mg

## 2024-04-21 MED ORDER — SODIUM CHLORIDE 0.9 % IV SOLN
12.5000 mg | Freq: Once | INTRAVENOUS | Status: DC
  Filled 2024-04-21: qty 0.5

## 2024-04-21 NOTE — Progress Notes (Signed)
 Pt receives OP HD, MWF at [facility], chair time 1050. Will assist as needed.   Sahiba Granholm Dialysis Navigator [email or web address]

## 2024-04-21 NOTE — Progress Notes (Signed)
 Patient ID: [name], female   DOB: May 29, 1956, 68 y.o.   MRN: [id number] Patient is postoperative day 1 right below-knee amputation, For necrotizing fasciitis right lower extremity.  There was fascia with edema at the level of amputation.  This tissue was sent for cultures.  Current blood cultures are showing MRSA.  There is 25 cc in the wound VAC canister.  Plan for progressive therapy and mobilization white cell count has decreased to 19.1.  Hemoglobin 7.8.

## 2024-04-21 NOTE — Progress Notes (Signed)
 Nephrology Follow-Up Consult note  Outpatient dialysis unit: [facility] prescription: F180, 350 BFR, EDW 69.5 (weight was 67.4 post HD on 7/25), 4hrs, 3K, 2.5 Ca, RIJ TDC, mircera 100mcg given 7/25, was on IV iron , calcitriol  0.25mcg on home meds list (unclear if taking, no binders   Assessment/Recommendations: [name] is a/an 68 y.o. female with a past medical history notable for ESRD on HD admitted with sepsis c/b shock   # ESRD: Dialysis on MWF schedule outpatient.  Held dialysis Monday because of hypotension.  Plan for dialysis in the ICU today and return to schedule as able.   # Volume/ hypertension: Continue with ultrafiltration as tolerated on dialysis.  At this time limited by hypotension.  Will need lower dry weight at discharge   # Anemia of Chronic Kidney Disease: Hemoglobin 7.8.  Received Mircera on 7/25.  Hold IV iron  for now.  Transfusion if needed   # Secondary Hyperparathyroidism/Hyperphosphatemia: Corrected calcium  normal.  Albumin  very low associated with infection.  Not on any binders but phosphorus at goal   # Vascular access: [facility] with no issues   # Septic shock: Antibiotics and pressors per primary team.    # Osteomyelitis/necrotizing fasciitis: Status post surgery on 7/28.     # Additional recommendations: - Dose all meds for creatinine clearance < 10 ml/min  - Unless absolutely necessary, no MRIs with gadolinium.  - Implement save arm precautions.  Prefer needle sticks in the dorsum of the hands or wrists.  No blood pressure measurements in arm. - If blood transfusion is requested during hemodialysis sessions, please alert us  prior to the session.  - Use synthetic opioids (Fentanyl /Dilaudid ) if needed   Recommendations were discussed with the primary team.   Recommendations conveyed to primary service.    [name] [facility] 04/21/2024 10:15  AM  ___________________________________________________________  CC: Weakness  Interval History/Subjective: Patient feels better today.  Denies specific complaints.  Norepinephrine  off at this time only on vasopressin .  Patient underwent amputation below the knee of the right leg yesterday because of concerns regarding necrotizing fasciitis.   Medications:  Current Facility-Administered Medications  Medication Dose Route Frequency Provider Last Rate Last Admin   acetaminophen  (TYLENOL ) tablet 650 mg  650 mg Oral Q6H PRN Amoako, Prince, MD       apixaban  (ELIQUIS ) tablet 2.5 mg  2.5 mg Oral BID Amoako, Prince, MD       ascorbic acid  (VITAMIN C ) tablet 1,000 mg  1,000 mg Oral Daily Duda, Marcus V, MD   1,000 mg at 04/21/24 0803   brimonidine  (ALPHAGAN ) 0.2 % ophthalmic solution 1 drop  1 drop Both Eyes BID Paliwal, Aditya, MD   1 drop at 04/21/24 0805   Chlorhexidine  Gluconate Cloth 2 % PADS 6 each  6 each Topical Daily Jude Harden GAILS, MD   6 each at 04/21/24 0804   Chlorhexidine  Gluconate Cloth 2 % PADS 6 each  6 each Topical Q0600 Harden Jerona GAILS, MD       docusate sodium  (COLACE) capsule 100 mg  100 mg Oral BID PRN Harden Jerona GAILS, MD       fentaNYL  (SUBLIMAZE ) injection 12.5 mcg  12.5 mcg Intravenous Q4H PRN Amoako, Prince, MD       insulin  aspart (novoLOG ) injection 0-15 Units  0-15 Units Subcutaneous TID WC Merilee Linsey I, RPH       insulin  glargine-yfgn (SEMGLEE ) injection 20 Units  20 Units Subcutaneous QHS Harden Jerona GAILS, MD   20 Units  at 04/20/24 2145   latanoprost  (XALATAN ) 0.005 % ophthalmic solution 1 drop  1 drop Both Eyes QHS Paliwal, Aditya, MD   1 drop at 04/20/24 2145   midodrine  (PROAMATINE ) tablet 10 mg  10 mg Oral TID WC Harden Jerona GAILS, MD   10 mg at 04/21/24 9196   norepinephrine  (LEVOPHED ) 4mg  in (0.016 mg/mL) premix infusion  0-40 mcg/min Intravenous Continuous Ula Prentice SAUNDERS, MD 7.5 mL/hr at 04/21/24 0600 2 mcg/min at 04/21/24 0600   nutrition supplement  (JUVEN) (JUVEN) powder packet 1 packet  1 packet Oral BID BM Duda, Marcus V, MD   1 packet at 04/21/24 9196   Oral care mouth rinse  15 mL Mouth Rinse PRN Harden Jerona GAILS, MD       oxyCODONE  (Oxy IR/ROXICODONE ) immediate release tablet 5 mg  5 mg Oral Q6H PRN Renne Homans, MD   5 mg at 04/21/24 0844   polyethylene glycol (MIRALAX  / GLYCOLAX ) packet 17 g  17 g Oral Daily PRN Harden Jerona GAILS, MD       vancomycin  (VANCOREADY) IVPB 750 mg/150 mL  750 mg Intravenous Once Merilee Linsey I, [facility]       vancomycin  variable dose per unstable renal function (pharmacist dosing)   Does not apply See admin instructions Merilee Linsey I, RPH       vasopressin  (PITRESSIN) 20 Units in 100 mL (0.2 unit/mL) infusion-*FOR SHOCK*  0.03 Units/min Intravenous Continuous Hunsucker, Donnice SAUNDERS, MD 9 mL/hr at 04/21/24 0600 0.03 Units/min at 04/21/24 0600   zinc  sulfate (50mg  elemental zinc ) capsule 220 mg  220 mg Oral Daily Harden Jerona GAILS, MD   220 mg at 04/21/24 9196      Review of Systems: 10 systems reviewed and negative except per interval history/subjective  Physical Exam: Vitals:   04/21/24 0600 04/21/24 0745  BP:    Pulse: 91   Resp: 18   Temp:  97.7 F (36.5 C)  SpO2: 100%    Total I/O In: 120 [P.O.:120] Out: -   Intake/Output Summary (Last 24 hours) at 04/21/2024 1015 Last data filed at 04/21/2024 0800 Gross per 24 hour  Intake 606.21 ml  Output 50 ml  Net 556.21 ml   Constitutional: well-appearing, no acute distress ENMT: ears and nose without scars or lesions, MMM CV: normal rate, right upper extremity 1+ edema Respiratory: Bilateral chest rise, normal work of breathing Gastrointestinal: soft, non-tender, no palpable masses or hernias Skin: no visible lesions or rashes Extremities: Right lower extremity absent below the knee, wound covered Psych: alert, judgement/insight appropriate, appropriate mood and affect   Test Results I personally reviewed new and old clinical labs and  radiology tests Lab Results  Component Value Date   NA 128 (L) 04/21/2024   K 3.4 (L) 04/21/2024   CL 95 (L) 04/21/2024   CO2 22 04/21/2024   BUN 58 (H) 04/21/2024   CREATININE 6.86 (H) 04/21/2024   GFR 17.34 (L) 04/12/2021   CALCIUM  8.0 (L) 04/21/2024   ALBUMIN  1.5 (L) 04/19/2024   PHOS 4.2 04/21/2024    CBC Recent Labs  Lab 04/19/24 1341 04/20/24 0340 04/21/24 0520  WBC 28.5* 30.2* 19.1*  NEUTROABS 27.4*  --   --   HGB 9.4* 9.1* 7.8*  HCT 29.2* 27.5* 23.7*  MCV 82.7 79.5* 78.7*  PLT 225 244 156

## 2024-04-21 NOTE — Consult Note (Signed)
 VAT: Consult to troubleshoot CVC  All lumens power flushed. Blood return noted in each lumen. White lumen was sluggish. Pt repositioned, lumen flushed again, blood return noted. CVC found to be positional. Care RN at bedside during assessment.    Consult complete

## 2024-04-21 NOTE — Progress Notes (Signed)
 Inpatient Rehab Admissions Coordinator:   CIR consult received. Await therapy assessments.   Leita Kleine, MS, CCC-SLP Rehab Admissions Coordinator  [phone] (celll) [phone] (office)

## 2024-04-21 NOTE — Progress Notes (Signed)
 NAME:  KIYANNA BIEGLER, MRN:  [id number], DOB:  1956-04-18, LOS: 2 ADMISSION DATE:  04/19/2024, CONSULTATION DATE:  04/17/2024 REFERRING MD: EDP CHIEF COMPLAINT: Hypotension  History of Present Illness:  68 year old woman with end-stage renal disease on HD who initially presented with general weakness likely due to sepsis and also found to have osteomyelitis of the right fibula and septic arthritis with exposure of medullary bone of air on x-ray is concerning for necrotizing fasciitis.  Orthopedics was consulted who is recommending transtibial amputation on the right. S/p right BKA.  Pertinent  Medical History   Past Medical History:  Diagnosis Date   Anemia    Arthritis    Atrial fibrillation (HCC)    CAD (coronary artery disease)    Chronic kidney disease, stage III (moderate) (HCC)    Constipation    Diabetes mellitus (HCC)    History of blood transfusion    HLD (hyperlipidemia)    Hypertension    Myocardial infarction [facility])    in April 2014     Significant Hospital Events: Including procedures, antibiotic start and stop dates in addition to other pertinent events    Interim History / Subjective:  7/27 : Admitted 7/28 : ABIs ordered, orthopedics consulted, planning for transtibial amputation 7/28 : Right BKA 7/29 : POD 1 Right BKA   Objective   Blood pressure (!) 110/59, pulse 91, temperature 97.7 F (36.5 C), temperature source Oral, resp. rate 18, weight 73 kg, SpO2 100%.        Intake/Output Summary (Last 24 hours) at 04/21/2024 0900 Last data filed at 04/21/2024 0600 Gross per 24 hour  Intake 528.81 ml  Output 50 ml  Net 478.81 ml   Filed Weights   04/20/24 0449  Weight: 73 kg    Examination: General: Chronically ill-appearing, laying in bed in no acute distress HENT: Right IJ tunneled catheter in place Lungs: On room air, lungs are clear to auscultation bilaterally Cardiovascular: Regular rate and rhythm Abdomen: Soft nontender, no guarding or rebound  tenderness Extremities: Warm and dry Neuro: Alert, responding to questions appropriately, oriented to self time and place GU: Deferred MSK : Right BKA site looks clean/dry and intact   Resolved Hospital Problem list   N/A  Assessment & Plan:  Septic shock MRSA/Group B Strep Bacteremia  -  Likely in the setting of osteomyelitis and septic arthritis -  Blood cultures grew MRSA and group B strep -  Source controlled , TTE negative  -  Continue vancomycin  for bacteremia  -  ID is following, appreciate their recs   Right ankle wound - Postop day 1 right BKA - Pain control with Tylenol  and fentanyl  and Oxycodone  PRN  - Orthopedics is following, appreciate Dr. Crist recs - PT/OT   End-stage renal disease on hemodialysis - Nephrology following, dialysis as needed - Will consult IR for [facility] removal  - Dialysis today per Nephrology   Type 2 diabetes -Moderate SSI - Semglee  20 units @10  pm  - CBG monitoring  DVT ppx - Eliquis   Best Practice (right click and Reselect all SmartList Selections daily)   Diet/type: NPO w/ oral meds DVT prophylaxis: DOAC GI prophylaxis: N/A Lines: Central line Foley:  N/A Code Status:  DNR Last date of multidisciplinary goals of care discussion [as above]  Labs   CBC: Recent Labs  Lab 04/19/24 1341 04/20/24 0340 04/21/24 0520  WBC 28.5* 30.2* 19.1*  NEUTROABS 27.4*  --   --   HGB 9.4* 9.1* 7.8*  HCT 29.2*  27.5* 23.7*  MCV 82.7 79.5* 78.7*  PLT 225 244 156    Basic Metabolic Panel: Recent Labs  Lab 04/19/24 1341 04/19/24 2016 04/20/24 0340 04/21/24 0520  NA 128* 130* 128* 128*  K 3.9 3.8 3.3* 3.4*  CL 92* 94* 92* 95*  CO2 17* 17* 21* 22  GLUCOSE 391* 436* 374* 124*  BUN 54* 51* 55* 58*  CREATININE 7.17* 6.96* 6.89* 6.86*  CALCIUM  8.0* 7.9* 8.1* 8.0*  MG  --   --  1.9 2.0  PHOS  --   --  3.9 4.2   GFR: Estimated Creatinine Clearance: 7.6 mL/min (A) (by C-G formula based on SCr of 6.86 mg/dL (H)). Recent Labs  Lab  04/19/24 1341 04/19/24 1409 04/19/24 2016 04/20/24 0340 04/21/24 0520  WBC 28.5*  --   --  30.2* 19.1*  LATICACIDVEN  --  2.6* 1.5  --   --     Liver Function Tests: Recent Labs  Lab 04/19/24 1341  AST 13*  ALT 12  ALKPHOS 106  BILITOT 2.0*  PROT 5.4*  ALBUMIN  1.5*   No results for input(s): LIPASE, AMYLASE in the last 168 hours. No results for input(s): AMMONIA in the last 168 hours.  ABG    Component Value Date/Time   HCO3 29.4 (H) 08/01/2010 0805   TCO2 21 01/08/2013 1242   ACIDBASEDEF 7.0 (H) 06/14/2009 1616   O2SAT 59.0 08/01/2010 0805     Coagulation Profile: Recent Labs  Lab 04/19/24 1341  INR 1.1    Cardiac Enzymes: No results for input(s): CKTOTAL, CKMB, CKMBINDEX, TROPONINI in the last 168 hours.  HbA1C: HbA1c, POC (controlled diabetic range)  Date/Time Value Ref Range Status  02/28/2021 02:50 PM 6.4 0.0 - 7.0 % Final  06/16/2020 01:52 PM 6.6 0.0 - 7.0 % Final   Hgb A1c MFr Bld  Date/Time Value Ref Range Status  10/28/2023 05:22 AM 6.6 (H) 4.8 - 5.6 % Final    Comment:    (NOTE) Pre diabetes:          5.7%-6.4%  Diabetes:              >6.4%  Glycemic control for   <7.0% adults with diabetes     CBG: Recent Labs  Lab 04/20/24 1850 04/20/24 2004 04/20/24 2319 04/21/24 0354 04/21/24 0724  GLUCAP 138* 169* 170* 155* 92    Review of Systems:   Endorses generalized weakness.  Denies any chest pain or shortness of breath  Past Medical History:  She,  has a past medical history of Anemia, Arthritis, Atrial fibrillation (HCC), CAD (coronary artery disease), Chronic kidney disease, stage III (moderate) (HCC), Constipation, Diabetes mellitus (HCC), History of blood transfusion, HLD (hyperlipidemia), Hypertension, and Myocardial infarction (HCC).   Surgical History:   Past Surgical History:  Procedure Laterality Date   ANKLE SURGERY Right    x 6 total   BREAST BIOPSY Left    15 or 20 yearsa ago she cant remember  laterality    HARDWARE REMOVAL Right 02/13/2013   Procedure: HARDWARE REMOVAL;  Surgeon: Jerona LULLA Sage, MD;  Location: [facility] OR;  Service: Orthopedics;  Laterality: Right;  Right Ankle Removal Hardware, Debridement, Place Wound VAC   IR FLUORO GUIDE CV LINE LEFT  11/08/2023   IR FLUORO GUIDE CV LINE RIGHT  11/01/2023   IR FLUORO GUIDE CV LINE RIGHT  11/12/2023   IR US  GUIDE VASC ACCESS LEFT  11/08/2023   IR US  GUIDE VASC ACCESS RIGHT  11/01/2023   IR  US  GUIDE VASC ACCESS RIGHT  11/12/2023   laser surgery for diabetic retinopathy Bilateral    LEFT HEART CATHETERIZATION WITH CORONARY ANGIOGRAM N/A 01/09/2013   Procedure: LEFT HEART CATHETERIZATION WITH CORONARY ANGIOGRAM;  Surgeon: Toribio JONELLE Fuel, MD;  Location: [facility] CATH LAB;  Service: Cardiovascular;  Laterality: N/A;   ORIF ANKLE FRACTURE Right 01/12/2013   Procedure: OPEN REDUCTION INTERNAL FIXATION (ORIF) ANKLE FRACTURE;  Surgeon: Jerona LULLA Sage, MD;  Location: [facility] OR;  Service: Orthopedics;  Laterality: Right;   PARTIAL COLECTOMY N/A 11/02/2023   Procedure: OPEN PARTIAL COLECTOMY WITH END COLOSTOMY;  Surgeon: Dasie Leonor CROME, MD;  Location: [facility] OR;  Service: General;  Laterality: N/A;   PARTIAL HYSTERECTOMY     TUNNELLED CATHETER EXCHANGE N/A 04/15/2024   Procedure: TUNNELLED CATHETER EXCHANGE;  Surgeon: Pearline Norman RAMAN, MD;  Location: [facility] LAB;  Service: Cardiovascular;  Laterality: N/A;     Social History:   reports that she has never smoked. She has never used smokeless tobacco. She reports that she does not drink alcohol  and does not use drugs.   Family History:  Her family history includes Cervical cancer in her sister; Colon cancer (age of onset: 73) in her mother; Congestive Heart Failure in her sister; Diabetes in her brother, mother, and sister; Heart disease in her father and maternal grandmother; Hypertension in her father; Kidney disease in her brother; Sickle cell anemia in her son; Thyroid  disease in her son. There is no history of  Breast cancer or BRCA 1/2.   Allergies Allergies  Allergen Reactions   Nsaids Other (See Comments)    CKD stage 4   Lisinopril Other (See Comments)    coughing   Peanut-Containing Drug Products Itching and Other (See Comments)    GI intolerance - diarrhea     Home Medications  Prior to Admission medications   Medication Sig Start Date End Date Taking? Authorizing Provider  albuterol  (PROVENTIL ) (2.5 MG/3ML) 0.083% nebulizer solution Take 3 mLs (2.5 mg total) by nebulization every 2 (two) hours as needed for wheezing. 11/25/23  Yes Ghimire, Donalda HERO, MD  apixaban  (ELIQUIS ) 2.5 MG TABS tablet Take 1 tablet (2.5 mg total) by mouth 2 (two) times daily. 10/04/22  Yes Acharya, Gayatri A, MD  brimonidine  (ALPHAGAN ) 0.2 % ophthalmic solution Place 1 drop into both eyes 2 (two) times daily.   Yes [provider]  calcitRIOL  (ROCALTROL ) 0.25 MCG capsule TAKE 1 CAPSULE (0.25 MCG TOTAL) EVERY MONDAY, WEDNESDAY, AND FRIDAY Patient taking differently: Take 0.25 mcg by mouth in the morning and at bedtime. 06/22/22  Yes Christia Budds, MD  carvedilol  (COREG ) 12.5 MG tablet TAKE 1 TABLET (12.5 MG TOTAL) BY MOUTH 2 (TWO) TIMES DAILY WITH A MEAL. 12/22/21  Yes Autry-Lott, Rojean, DO  insulin  glargine (LANTUS ) 100 UNIT/ML injection Inject 0.07 mLs (7 Units total) into the skin daily. Please schedule appointment before next refill. Patient taking differently: Inject 10 Units into the skin daily. Please schedule appointment before next refill. 02/01/24  Yes [name], MD  latanoprost  (XALATAN ) 0.005 % ophthalmic solution Place 1 drop into both eyes at bedtime.   Yes [provider]  melatonin 5 MG TABS Take 10 mg by mouth at bedtime as needed (for insomnia).   Yes [provider]  midodrine  (PROAMATINE ) 2.5 MG tablet Take 2.5 mg by mouth.   Yes [provider]  Multiple Vitamin (MULTIVITAMIN WITH MINERALS) TABS Take 1 tablet by mouth daily.   Yes [provider]  NOVOLIN  R 100 UNIT/ML injection Inject 0-9 Units into the skin in the morning, at noon, and at bedtime. Per sliding scale 04/03/24  Yes [provider]  ACCU-CHEK AVIVA PLUS test strip CHECK BLOOD GLUCOSE THREE TIMES DAILY 11/05/22   Joshua Domino, DO  amLODipine  (NORVASC ) 5 MG tablet Take 0.5 tablets (2.5 mg total) by mouth at bedtime. Patient not taking: Reported on 03/26/2024 02/01/24 03/26/24  [name], MD  Blood Glucose Monitoring Suppl (ACCU-CHEK AVIVA PLUS) w/Device KIT CHECK BLOOD GLUCOSE THREE TIMES DAILY 10/26/20   Autry-Lott, Rojean, DO  collagenase  (SANTYL ) 250 UNIT/GM ointment Apply 1 Application topically daily. Apply nickel thick amount to ulcer Right lateral ankle 3 x 2 cm with muscle/fascia capsule exposed Patient not taking: Reported on 04/19/2024 04/15/24 05/15/24  Lamount Ethan CROME, DPM  DROPLET INSULIN  SYRINGE 31G X 5/16 0.5 ML MISC USE FOUR TIMES DAILY FOR INJECTIONS 09/03/22   Joshua Domino, DO  furosemide  (LASIX ) 40 MG tablet Take 40 mg by mouth 2 (two) times daily. Patient not taking: Reported on 04/19/2024    [provider]  insulin  aspart (NOVOLOG  FLEXPEN) 100 UNIT/ML FlexPen 0-9 Units, Subcutaneous, 3 times daily with meals CBG < 70: Implement Hypoglycemia measures CBG 70 - 120: 0 units CBG 121 - 150: 1 unit CBG 151 - 200: 2 units CBG 201 - 250: 3 units CBG 251 - 300: 5 units CBG 301 - 350: 7 units CBG 351 - 400: 9 units CBG > 400: call MD Patient not taking: Reported on 04/19/2024 11/25/23   Raenelle Donalda HERO, MD  Insulin  Syringes, Disposable, U-100 0.5 ML MISC 4x daily injections 03/21/18   Riccio, Angela C, DO  Lancet Devices (ACCU-CHEK SOFTCLIX) lancets Fill for 1 month for TID testing. Use as instructed 11/13/13   Curtis Hadassah DASEN, MD  NEEDLE, DISP, 30 G (BD DISP NEEDLES) 30G X 1/2 MISC For 4x daily injections 11/05/15   Phelps, Jazma Y, DO  REPATHA SURECLICK 140 MG/ML SOAJ Inject 140 mg into the skin every 14 (fourteen) days. Patient not taking:  Reported on 03/26/2024 09/16/23   [provider]  VITAMIN D , CHOLECALCIFEROL, PO Take 1 tablet by mouth daily. Patient not taking: Reported on 04/19/2024    [provider]     Critical care time: 40 minutes      Drue Grow MD [facility] Internal Medicine Program - PGY-2 04/21/2024, 9:00 AM Pager# [phone]

## 2024-04-21 NOTE — Progress Notes (Signed)
 Patient Dialysis catheter did not work.  Informed Dr. Macel.  Dr. Macel instructed to let TPA dwell for 30 minutes.

## 2024-04-21 NOTE — Progress Notes (Signed)
 TPA dwelled in HD catheters per Dr. Macel instruction.  Informed Dr. Macel and Dr. Annella that TPA did not work for Venous Blue lumen.    Patient did not get dialysis today.

## 2024-04-21 NOTE — Evaluation (Signed)
 Physical Therapy Evaluation Patient Details Name: [name] MRN: [id number] DOB: [birth year] Today's Date: 04/21/2024  History of Present Illness  68 yo female adm 04/19/24 with sepsis, hypotension and Rt food wound. 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 PMHx: ESRD on HD, colostomy, T2DM, Afib, Rt ankle ORIF, CAD  Clinical Impression  Pt admitted with above. At this time patient requiring modAx2 for EOB and OOB mobility due to recent R BKA. Pt very motivated and tolerated mobility well for first day and was able to transfer to chair this date. Pt to greatly benefit from aggressive rehab program > 3 hrs a day to achieve safe mod I w/c level of function for safe transition home with 3 sons. Acute PT to cont to follow.        If plan is discharge home, recommend the following: A lot of help with walking and/or transfers;A lot of help with bathing/dressing/bathroom;Help with stairs or ramp for entrance;Assist for transportation   Can travel by private vehicle        Equipment Recommendations Wheelchair (measurements PT);Wheelchair cushion (measurements PT);Rolling walker (2 wheels)  Recommendations for Other Services       Functional Status Assessment Patient has had a recent decline in their functional status and demonstrates the ability to make significant improvements in function in a reasonable and predictable amount of time.     Precautions / Restrictions Precautions Precautions: Fall;Other (comment) (new R BKA) Recall of Precautions/Restrictions: Impaired Restrictions Weight Bearing Restrictions Per Provider Order: Yes RLE Weight Bearing Per Provider Order: Non weight bearing Other Position/Activity Restrictions: wound vac to R residual limb      Mobility  Bed Mobility Overal bed mobility: Needs Assistance Bed Mobility: Supine to Sit     Supine to sit: Mod assist     General bed mobility comments: cues for sequence and assist for bringing LE/hips toward EOB; CGA to elevate trunk,  increased time    Transfers Overall transfer level: Needs assistance Equipment used: 2 person hand held assist (face to face transfer with use of bed pad) Transfers: Sit to/from Stand, Bed to chair/wheelchair/BSC Sit to Stand: Mod assist, +2 physical assistance Stand pivot transfers: +2 safety/equipment, Mod assist, +2 physical assistance         General transfer comment: Pt with good power up from EOB with mod A of 2 with use of bed pad and face to face transfer due to pt with colostomy and port in chest and unable to safely use a gait belt    Ambulation/Gait               General Gait Details: unable at this time  Stairs            Wheelchair Mobility     Tilt Bed    Modified Rankin (Stroke Patients Only)       Balance Overall balance assessment: Needs assistance Sitting-balance support: Bilateral upper extremity supported, Feet supported Sitting balance-Leahy Scale: Fair Sitting balance - Comments: statically   Standing balance support: Bilateral upper extremity supported, During functional activity Standing balance-Leahy Scale: Poor Standing balance comment: reliant on external support                             Pertinent Vitals/Pain Pain Assessment Pain Assessment: Faces Faces Pain Scale: Hurts little more Pain Location: RLE, bottom Pain Descriptors / Indicators: Aching    Home Living Family/patient expects to be discharged to:: Inpatient rehab Living Arrangements: Children Available Help  at Discharge: Family;Available 24 hours/day Type of Home: House Home Access: Stairs to enter Entrance Stairs-Rails: Can reach both Entrance Stairs-Number of Steps: 8, no steps through the side Alternate Level Stairs-Number of Steps: 4 - to kitchen and bedroom Home Layout: Other (Comment);Bed/bath upstairs;Multi-level Home Equipment: Cane - single point;Wheelchair - Forensic psychologist (2 wheels);Other (comment);Shower seat      Prior  Function Prior Level of Function : Needs assist             Mobility Comments: significant assist recently from sons to and from bathroom ADLs Comments: pt reports independent until 3 weeks ago     Extremity/Trunk Assessment   Upper Extremity Assessment Upper Extremity Assessment: Generalized weakness    Lower Extremity Assessment Lower Extremity Assessment: RLE deficits/detail;LLE deficits/detail RLE Deficits / Details: R BKA, good knee and hip ROM LLE Deficits / Details: WFL       Communication   Communication Communication: No apparent difficulties    Cognition Arousal: Alert Behavior During Therapy: WFL for tasks assessed/performed, Anxious   PT - Cognitive impairments: No apparent impairments                       PT - Cognition Comments: pt anxious regarding mobility as expected due to new R BKA however cooperative and participatory. Pt did not want to see her R residual limb so kept covered with sheet Following commands: Impaired Following commands impaired: Follows one step commands with increased time, Follows multi-step commands inconsistently     Cueing Cueing Techniques: Verbal cues, Gestural cues, Tactile cues     General Comments General comments (skin integrity, edema, etc.): BP soft but stable    Exercises     Assessment/Plan    PT Assessment Patient needs continued PT services  PT Problem List Decreased strength;Decreased activity tolerance;Decreased balance;Decreased mobility       PT Treatment Interventions DME instruction;Gait training;Stair training;Functional mobility training;Therapeutic exercise;Therapeutic activities;Balance training    PT Goals (Current goals can be found in the Care Plan section)  Acute Rehab PT Goals Patient Stated Goal: get better PT Goal Formulation: With patient Time For Goal Achievement: 05/05/24 Potential to Achieve Goals: Good    Frequency Min 3X/week     Co-evaluation PT/OT/SLP  Co-Evaluation/Treatment: Yes Reason for Co-Treatment: To address functional/ADL transfers PT goals addressed during session: Mobility/safety with mobility         AM-PAC PT 6 Clicks Mobility  Outcome Measure Help needed turning from your back to your side while in a flat bed without using bedrails?: A Lot Help needed moving from lying on your back to sitting on the side of a flat bed without using bedrails?: A Lot Help needed moving to and from a bed to a chair (including a wheelchair)?: A Lot Help needed standing up from a chair using your arms (e.g., wheelchair or bedside chair)?: A Lot Help needed to walk in hospital room?: Total Help needed climbing 3-5 steps with a railing? : Total 6 Click Score: 10    End of Session   Activity Tolerance: Patient tolerated treatment well Patient left: in chair;with call bell/phone within reach;with chair alarm set Nurse Communication: Mobility status (how to transfer pt back to bed using assistx2 and bed pad or stedy) PT Visit Diagnosis: Difficulty in walking, not elsewhere classified (R26.2)    Time: 9071-8984 PT Time Calculation (min) (ACUTE ONLY): 47 min   Charges:   PT Evaluation $PT Eval Moderate Complexity: 1 Mod PT Treatments $Therapeutic Activity:  8-22 mins PT General Charges $$ ACUTE PT VISIT: 1 Visit         Norene Ames, PT, DPT Acute Rehabilitation Services Secure chat preferred Office #: [phone]   Norene CHRISTELLA Ames 04/21/2024, 2:20 PM

## 2024-04-21 NOTE — Progress Notes (Signed)
 Pharmacy Antibiotic Note  [name] is a 68 y.o. female admitted on 04/19/2024 with bacteremia.  Pharmacy has been consulted for Vancomycin  dosing.  Received loading dose of Vancomycin  1500 mg on 7/27. Pt on HD with unclear schedule at this time, but will dialyze today (04/21/24). Obtained a pre-dialysis random vanc level on 7/28 of 15 mcg/mL - therapeutic.   Plan: Vancomycin  750 mg IV post-HD x1.  Goal trough 15-20 mcg/mL.  Due to unclear HD schedule plan to leave vanc variable dosing order on [facility] as a place holder.  Monitor vanc level PRN per protocol.  Weight: 73 kg (160 lb 15 oz)  Temp (24hrs), Avg:97.9 F (36.6 C), Min:97.3 F (36.3 C), Max:98.6 F (37 C)  Recent Labs  Lab 04/19/24 1341 04/19/24 1409 04/19/24 2016 04/20/24 0340 04/21/24 0520  WBC 28.5*  --   --  30.2* 19.1*  CREATININE 7.17*  --  6.96* 6.89* 6.86*  LATICACIDVEN  --  2.6* 1.5  --   --   VANCORANDOM  --   --   --   --  15    Estimated Creatinine Clearance: 7.6 mL/min (A) (by C-G formula based on SCr of 6.86 mg/dL (H)).    Allergies  Allergen Reactions   Nsaids Other (See Comments)    CKD stage 4   Lisinopril Other (See Comments)    coughing   Peanut-Containing Drug Products Itching and Other (See Comments)    GI intolerance - diarrhea    Antimicrobials this admission: Clindamycin  7/27 >> 7/28 Cefepime  7/27 >> 7/28 Vancomycin  7/27 >>  Dose adjustments this admission: N/A  Microbiology results: 7/27 BCx: 2/4 Bcx MRSA, GBS 7/27 MRSA PCR: Neg 7/29 Bcx: In Progress 7/29 Leg Cx: In Progress  Thank you for allowing pharmacy to be a part of this patient's care.  Prentice DOROTHA Favors, PharmD PGY1 Health-System Pharmacy Administration and Leadership Resident [facility]  04/21/2024 9:36 AM

## 2024-04-21 NOTE — Evaluation (Signed)
 Occupational Therapy Evaluation Patient Details Name: [name] MRN: [id number] DOB: [birth year] Today's Date: 04/21/2024   History of Present Illness   68 yo female adm 04/19/24 with sepsis, hypotension and Rt food wound. 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 PMHx: ESRD on HD, colostomy, T2DM, Afib, Rt ankle ORIF, CAD     Clinical Impressions PTA, pt lived with sons who were recently assisting with mobility and ADL; but pr reports prior to 3 weeks ago, she was independent. Upon eval, pt performing UB ADL with up to min A at EOB and LB ADL with max A. Pt needing cues for sequencing bed mobility and transfers this session in light of new amputation. Presenting with decreased activity tolerance, balance, safety, strength, and knowledge of precautions/mobility progression. Pt was not ready to see her leg today, so maintained covered during session. Pt needing grossly mod A +2 for functional mobility, able to rise to standing position and pivot to chair with assist. Due to significant change in functional status and rehab potential, recommending intensive multidisciplinary rehabilitation >3 hours/day to optimize safety and independence in ADL.      If plan is discharge home, recommend the following:   Two people to help with walking and/or transfers;A lot of help with bathing/dressing/bathroom;Two people to help with bathing/dressing/bathroom;Assistance with cooking/housework;Assist for transportation;Help with stairs or ramp for entrance     Functional Status Assessment   Patient has had a recent decline in their functional status and demonstrates the ability to make significant improvements in function in a reasonable and predictable amount of time.     Equipment Recommendations   Other (comment) (defer)     Recommendations for Other Services   Rehab consult     Precautions/Restrictions   Precautions Precautions: Fall;Other (comment) (new R BKA) Recall of Precautions/Restrictions:  Impaired Restrictions Weight Bearing Restrictions Per Provider Order: Yes RLE Weight Bearing Per Provider Order: Non weight bearing     Mobility Bed Mobility Overal bed mobility: Needs Assistance Bed Mobility: Supine to Sit     Supine to sit: Mod assist     General bed mobility comments: cues for sequence and assist for bringing LE/hips toward EOB; CGA to elevate trunk    Transfers Overall transfer level: Needs assistance Equipment used: 2 person hand held assist Transfers: Sit to/from Stand, Bed to chair/wheelchair/BSC Sit to Stand: Mod assist, +2 physical assistance Stand pivot transfers: +2 safety/equipment, Mod assist, +2 physical assistance         General transfer comment: Pt with good power up from EOB with mod A of 2 as well as for pivot      Balance Overall balance assessment: Needs assistance Sitting-balance support: Bilateral upper extremity supported, Feet supported Sitting balance-Leahy Scale: Fair Sitting balance - Comments: statically   Standing balance support: Bilateral upper extremity supported, During functional activity Standing balance-Leahy Scale: Poor Standing balance comment: reliant on external support                           ADL either performed or assessed with clinical judgement   ADL Overall ADL's : Needs assistance/impaired Eating/Feeding: Set up;Sitting   Grooming: Set up;Sitting Grooming Details (indicate cue type and reason): EOB Upper Body Bathing: Minimal assistance;Sitting   Lower Body Bathing: Maximal assistance;Sitting/lateral leans   Upper Body Dressing : Minimal assistance;Sitting   Lower Body Dressing: Maximal assistance;Sit to/from stand;Sitting/lateral leans   Toilet Transfer: Moderate assistance;+2 for safety/equipment;Stand-pivot;Maximal assistance  Vision Baseline Vision/History: 2 Legally blind Ability to See in Adequate Light: 2 Moderately impaired Patient Visual Report:  No change from baseline Vision Assessment?: No apparent visual deficits     Perception Perception: Not tested       Praxis Praxis: Not tested       Pertinent Vitals/Pain Pain Assessment Pain Assessment: Faces Faces Pain Scale: Hurts even more Pain Location: RLE, bottom Pain Descriptors / Indicators: Aching Pain Intervention(s): Limited activity within patient's tolerance, Monitored during session     Extremity/Trunk Assessment Upper Extremity Assessment Upper Extremity Assessment: Generalized weakness   Lower Extremity Assessment Lower Extremity Assessment: Defer to PT evaluation       Communication Communication Communication: No apparent difficulties   Cognition Arousal: Alert Behavior During Therapy: WFL for tasks assessed/performed, Anxious Cognition: Cognition impaired     Awareness: Intellectual awareness intact, Online awareness impaired   Attention impairment (select first level of impairment): Selective attention Executive functioning impairment (select all impairments): Organization, Sequencing, Problem solving (cues during functional mobility for sequencing toward EOB, predominantly due to anxiety.fear of falling with transfers) OT - Cognition Comments: follows one step commands, benefits from cues for problem solving                 Following commands: Impaired Following commands impaired: Follows one step commands with increased time, Follows multi-step commands inconsistently     Cueing  General Comments   Cueing Techniques: Verbal cues;Gestural cues;Tactile cues  BP soft but stable   Exercises     Shoulder Instructions      Home Living Family/patient expects to be discharged to:: Inpatient rehab Living Arrangements: Children Available Help at Discharge: Family;Available 24 hours/day Type of Home: House Home Access: Stairs to enter [employer] of Steps: 8, no steps through the side Entrance Stairs-Rails: Can reach  both Home Layout: Other (Comment);Bed/bath upstairs;Multi-level Alternate Level Stairs-Number of Steps: 4 - to kitchen and bedroom Alternate Level Stairs-Rails: Left Bathroom Shower/Tub: Chief Strategy Officer: Standard     Home Equipment: Cane - single point;Wheelchair - Forensic psychologist (2 wheels);Other (comment);Shower seat          Prior Functioning/Environment Prior Level of Function : Needs assist             Mobility Comments: significant assist recently from sons to and from bathroom ADLs Comments: pt reports independent until 3 weeks ago    OT Problem List: Decreased strength;Decreased activity tolerance;Impaired balance (sitting and/or standing);Decreased safety awareness;Decreased cognition;Decreased knowledge of use of DME or AE;Decreased knowledge of precautions;Pain;Impaired sensation (neuropathy)   OT Treatment/Interventions: Self-care/ADL training;Therapeutic exercise;DME and/or AE instruction;Therapeutic activities;Balance training;Patient/family education      OT Goals(Current goals can be found in the care plan section)   Acute Rehab OT Goals Patient Stated Goal: get better OT Goal Formulation: With patient Time For Goal Achievement: 05/05/24 Potential to Achieve Goals: Good   OT Frequency:  Min 2X/week    Co-evaluation              AM-PAC OT 6 Clicks Daily Activity     Outcome Measure Help from another person eating meals?: A Little Help from another person taking care of personal grooming?: A Little Help from another person toileting, which includes using toliet, bedpan, or urinal?: A Lot Help from another person bathing (including washing, rinsing, drying)?: A Lot Help from another person to put on and taking off regular upper body clothing?: A Little Help from another person to put on and taking off  regular lower body clothing?: A Lot 6 Click Score: 15   End of Session Equipment Utilized During Treatment: Other  (comment) (bed pad boost) Nurse Communication: Mobility status  Activity Tolerance: Patient tolerated treatment well Patient left: in chair;with call bell/phone within reach;with chair alarm set  OT Visit Diagnosis: Unsteadiness on feet (R26.81);Muscle weakness (generalized) (M62.81);Pain;Other symptoms and signs involving cognitive function                Time: 9071-9044 OT Time Calculation (min): 27 min Charges:  OT General Charges $OT Visit: 1 Visit OT Evaluation $OT Eval Moderate Complexity: 1 Mod  Elma JONETTA Lebron FREDERICK, OTR/L [facility] Acute Rehabilitation Office: [phone]   Elma JONETTA Lebron 04/21/2024, 1:45 PM

## 2024-04-21 NOTE — Procedures (Signed)
 Interventional Radiology Procedure Note  PROCEDURE SUMMARY:  Successful removal of tunneled HD catheter.  No complications.   EBL = trace  Please see full dictation in imaging section of Epic for procedure details.  Electronically Signed: Carlin DELENA Griffon, PA-C 04/21/2024, 3:55 PM

## 2024-04-22 DIAGNOSIS — B9689 Other specified bacterial agents as the cause of diseases classified elsewhere: Secondary | ICD-10-CM

## 2024-04-22 DIAGNOSIS — A419 Sepsis, unspecified organism: Secondary | ICD-10-CM | POA: Diagnosis not present

## 2024-04-22 DIAGNOSIS — B9562 Methicillin resistant Staphylococcus aureus infection as the cause of diseases classified elsewhere: Secondary | ICD-10-CM

## 2024-04-22 DIAGNOSIS — R6521 Severe sepsis with septic shock: Secondary | ICD-10-CM | POA: Diagnosis not present

## 2024-04-22 DIAGNOSIS — M86071 Acute hematogenous osteomyelitis, right ankle and foot: Secondary | ICD-10-CM | POA: Diagnosis not present

## 2024-04-22 DIAGNOSIS — M86169 Other acute osteomyelitis, unspecified tibia and fibula: Secondary | ICD-10-CM

## 2024-04-22 DIAGNOSIS — M545 Low back pain, unspecified: Secondary | ICD-10-CM

## 2024-04-22 DIAGNOSIS — R7881 Bacteremia: Secondary | ICD-10-CM

## 2024-04-22 LAB — CBC
HCT: 23.7 % — ABNORMAL LOW (ref 36.0–46.0)
Hemoglobin: 7.7 g/dL — ABNORMAL LOW (ref 12.0–15.0)
MCH: 25.8 pg — ABNORMAL LOW (ref 26.0–34.0)
MCHC: 32.5 g/dL (ref 30.0–36.0)
MCV: 79.5 fL — ABNORMAL LOW (ref 80.0–100.0)
Platelets: 194 K/uL (ref 150–400)
RBC: 2.98 MIL/uL — ABNORMAL LOW (ref 3.87–5.11)
RDW: 19.7 % — ABNORMAL HIGH (ref 11.5–15.5)
WBC: 17.8 K/uL — ABNORMAL HIGH (ref 4.0–10.5)
nRBC: 0.4 % — ABNORMAL HIGH (ref 0.0–0.2)

## 2024-04-22 LAB — RENAL FUNCTION PANEL
Albumin: <1.5 g/dL — ABNORMAL LOW (ref 3.5–5.0)
Anion gap: 11 (ref 5–15)
BUN: 74 mg/dL — ABNORMAL HIGH (ref 8–23)
CO2: 21 mmol/L — ABNORMAL LOW (ref 22–32)
Calcium: 7.6 mg/dL — ABNORMAL LOW (ref 8.9–10.3)
Chloride: 94 mmol/L — ABNORMAL LOW (ref 98–111)
Creatinine, Ser: 7.33 mg/dL — ABNORMAL HIGH (ref 0.44–1.00)
GFR, Estimated: 6 mL/min — ABNORMAL LOW (ref >60)
Glucose, Bld: 142 mg/dL — ABNORMAL HIGH (ref 70–99)
Phosphorus: 4.3 mg/dL (ref 2.5–4.6)
Potassium: 3.4 mmol/L — ABNORMAL LOW (ref 3.5–5.1)
Sodium: 126 mmol/L — ABNORMAL LOW (ref 135–145)

## 2024-04-22 LAB — GLUCOSE, CAPILLARY
Glucose-Capillary: 130 mg/dL — ABNORMAL HIGH (ref 70–99)
Glucose-Capillary: 80 mg/dL (ref 70–99)
Glucose-Capillary: 87 mg/dL (ref 70–99)
Glucose-Capillary: 97 mg/dL (ref 70–99)
Glucose-Capillary: 88 mg/dL (ref 70–99)

## 2024-04-22 LAB — HEPATITIS B SURFACE ANTIBODY, QUANTITATIVE: Hep B S AB Quant (Post): 3100 m[IU]/mL

## 2024-04-22 MED ORDER — LORAZEPAM 0.5 MG PO TABS
0.5000 mg | ORAL_TABLET | Freq: Once | ORAL | Status: AC | PRN
  Administered 2024-04-22: 0.5 mg via ORAL
  Filled 2024-04-22: qty 1

## 2024-04-22 MED ORDER — CEFAZOLIN SODIUM-DEXTROSE 2-4 GM/100ML-% IV SOLN
2.0000 g | Freq: Once | INTRAVENOUS | Status: AC
  Administered 2024-04-23: 2 g via INTRAVENOUS
  Filled 2024-04-22 (×2): qty 100

## 2024-04-22 MED ORDER — TRIMETHOBENZAMIDE HCL 100 MG/ML IM SOLN
200.0000 mg | Freq: Four times a day (QID) | INTRAMUSCULAR | Status: DC | PRN
  Administered 2024-04-22 – 2024-04-24 (×4): 200 mg via INTRAMUSCULAR
  Filled 2024-04-22 (×5): qty 2

## 2024-04-22 NOTE — Progress Notes (Signed)
 Physical Therapy Treatment Patient Details Name: [name] MRN: [id number] DOB: 04/22/1956 Today's Date: 04/22/2024   History of Present Illness 68 yo female adm 04/19/24 with sepsis, hypotension and Rt food wound. 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 PMHx: ESRD on HD, colostomy, T2DM, Afib, Rt ankle ORIF, CAD    PT Comments  Pt with report of nausea this date and with noted more flat affect but still cooperative and eager to mobilize. Pt continues to not want to view R residual limb. Pt tolerated transfer well and began LE exercises. Pt educated on phantom limb pain.  Pt continues to be appropriate for inpatient rehab program > 3 hrs to achieve mod I w/c level of function. Acute PT to cont to follow.    If plan is discharge home, recommend the following: A lot of help with walking and/or transfers;A lot of help with bathing/dressing/bathroom;Help with stairs or ramp for entrance;Assist for transportation   Can travel by private vehicle        Equipment Recommendations  Wheelchair (measurements PT);Wheelchair cushion (measurements PT);Rolling walker (2 wheels)    Recommendations for Other Services       Precautions / Restrictions Precautions Precautions: Fall;Other (comment) (new R BKA) Recall of Precautions/Restrictions: Impaired Restrictions Weight Bearing Restrictions Per Provider Order: Yes RLE Weight Bearing Per Provider Order: Non weight bearing Other Position/Activity Restrictions: wound vac to R residual limb     Mobility  Bed Mobility Overal bed mobility: Needs Assistance Bed Mobility: Supine to Sit     Supine to sit: Mod assist     General bed mobility comments: cues for sequence and assist for bringing LE/hips toward EOB; CGA to elevate trunk, increased time    Transfers Overall transfer level: Needs assistance Equipment used: 2 person hand held assist (face to face transfer with use of bed pad) Transfers: Sit to/from Stand, Bed to chair/wheelchair/BSC Sit to Stand: Mod  assist, +2 physical assistance Stand pivot transfers: +2 safety/equipment, Mod assist, +2 physical assistance         General transfer comment: Pt with good power up from EOB with mod A of 2 with use of bed pad and face to face transfer due to pt with colostomy and port in chest and unable to safely use a gait belt    Ambulation/Gait               General Gait Details: unable at this time   Stairs             Wheelchair Mobility     Tilt Bed    Modified Rankin (Stroke Patients Only)       Balance Overall balance assessment: Needs assistance Sitting-balance support: Bilateral upper extremity supported, Feet supported Sitting balance-Leahy Scale: Fair Sitting balance - Comments: statically   Standing balance support: Bilateral upper extremity supported, During functional activity Standing balance-Leahy Scale: Poor Standing balance comment: reliant on external support                            Communication Communication Communication: No apparent difficulties  Cognition Arousal: Alert (but more flat today compared yesterday, pt reports nausea) Behavior During Therapy: WFL for tasks assessed/performed, Anxious   PT - Cognitive impairments: No apparent impairments                         Following commands: Impaired Following commands impaired: Follows one step commands with increased time, Follows multi-step commands inconsistently  Cueing Cueing Techniques: Verbal cues, Gestural cues, Tactile cues  Exercises General Exercises - Lower Extremity Long Arc Quad: Both, 10 reps, Seated Hip ABduction/ADduction: AROM, Both, 10 reps, Seated Hip Flexion/Marching: AROM, Both, 10 reps, Seated    General Comments General comments (skin integrity, edema, etc.): VSS, episode of dry heaving once in chair but not +emesis      Pertinent Vitals/Pain Pain Assessment Pain Assessment: Faces Faces Pain Scale: Hurts little more Pain Location:  RLE, bottom Pain Descriptors / Indicators: Aching    Home Living                          Prior Function            PT Goals (current goals can now be found in the care plan section) Acute Rehab PT Goals Patient Stated Goal: get better PT Goal Formulation: With patient Time For Goal Achievement: 05/05/24 Potential to Achieve Goals: Good Progress towards PT goals: Progressing toward goals    Frequency    Min 3X/week      PT Plan      Co-evaluation              AM-PAC PT 6 Clicks Mobility   Outcome Measure  Help needed turning from your back to your side while in a flat bed without using bedrails?: A Lot Help needed moving from lying on your back to sitting on the side of a flat bed without using bedrails?: A Lot Help needed moving to and from a bed to a chair (including a wheelchair)?: A Lot Help needed standing up from a chair using your arms (e.g., wheelchair or bedside chair)?: A Lot Help needed to walk in hospital room?: Total Help needed climbing 3-5 steps with a railing? : Total 6 Click Score: 10    End of Session   Activity Tolerance: Patient tolerated treatment well Patient left: in chair;with call bell/phone within reach;with chair alarm set Nurse Communication: Mobility status (how to transfer pt back to bed using assistx2 and bed pad or stedy) PT Visit Diagnosis: Difficulty in walking, not elsewhere classified (R26.2)     Time: 8894-8858 PT Time Calculation (min) (ACUTE ONLY): 36 min  Charges:    $Therapeutic Exercise: 8-22 mins $Therapeutic Activity: 8-22 mins PT General Charges $$ ACUTE PT VISIT: 1 Visit                     Norene Ames, PT, DPT Acute Rehabilitation Services Secure chat preferred Office #: [phone]    Norene CHRISTELLA Ames 04/22/2024, 12:20 PM

## 2024-04-22 NOTE — Consult Note (Signed)
 WOC consulted for blockage alarm on Prevena dressing. Patient had placed in the OR 7/28 with Kerasis graft as well. Dr. Harden made aware of blockage alarms, appropriately staff have changed the [facility] canister (Prevena dressing is hooked to the hospital unit).    Await guidance from Dr. Harden, [facility] nursing team does not have access to Prevena dressings.   Anahi Belmar [facility], CNS, CWON-AP [phone]

## 2024-04-22 NOTE — Progress Notes (Signed)
 [facility]  Date of Admission:  04/19/2024     Reason for Follow Up: Septic shock (HCC)  Total days of antibiotics 4         ASSESSMENT:  [name] is a 68 y/o AA female with diabetes and s/p ORIF and debridement of the right ankle in 2014 presenting with generalized weakness and concern for infection of the right leg and found to have signs of hypotension with imaging concerning for osteomyelitis and suspected necrotizing fasciitis s/p below knee amputation.   [name] is POD # 2 from right below knee amputation in the setting of MRSA bacteremia. Surgical specimen without growth. Blood culture from 04/21/24 without growth. Tunneled dialysis catheter removed for line holiday and will need CVC line removed when appropriate per CCM. Discussed plan of care to continue with vancomycin  with dialysis.  TTE without evidence of endocarditis. Will continue to monitor blood cultures for clearance of bacteremia. Anticipate she will need 6 weeks of vancomycin  with dialysis pending blood culture clearance. Post-operative wound care per Orthopedics. Contact precautions for MRSA. Remaining medical and supportive care per PCCM.   PLAN:  Continue current dose of vancomycin .  Monitor cultures for clearance of bacteremia Line holiday in process per Nephrology and will need central line removed when able per PCCM.  Post-operative wound care per Orthopedics.  Contact precautions for MRSA. Remaining medical and supportive care per PCCM.    Principal Problem:   Septic shock (HCC) Active Problems:   Subacute osteomyelitis, right ankle and foot (HCC)   MRSA bacteremia   Bacteremia   Necrotizing fasciitis (HCC)   ESRD (end stage renal disease) (HCC)    vitamin C   1,000 mg Oral Daily   brimonidine   1 drop Both Eyes BID   Chlorhexidine  Gluconate Cloth  6 each Topical Daily   Chlorhexidine  Gluconate Cloth  6 each Topical Q0600   insulin  aspart  0-15 Units Subcutaneous  TID WC   insulin  glargine-yfgn  20 Units Subcutaneous QHS   midodrine   10 mg Oral TID WC   nutrition supplement (JUVEN)  1 packet Oral BID BM   vancomycin  variable dose per unstable renal function (pharmacist dosing)   Does not apply See admin instructions   zinc  sulfate (50mg  elemental zinc )  220 mg Oral Daily    SUBJECTIVE:  Afebrile overnight with no acute events. Tolerating antibiotics with no adverse side effects. Having nausea.   Allergies  Allergen Reactions   Nsaids Other (See Comments)    CKD stage 4   Lisinopril Other (See Comments)    coughing   Peanut-Containing Drug Products Itching and Other (See Comments)    GI intolerance - diarrhea     Review of Systems: Review of Systems  Constitutional:  Negative for chills, fever and weight loss.  Respiratory:  Negative for cough, shortness of breath and wheezing.   Cardiovascular:  Negative for chest pain and leg swelling.  Gastrointestinal:  Positive for nausea. Negative for abdominal pain, constipation, diarrhea and vomiting.  Skin:  Negative for rash.      OBJECTIVE: Vitals:   04/22/24 1245 04/22/24 1300 04/22/24 1315 04/22/24 1330  BP: (!) 99/59 (!) 107/59 120/61 (!) 102/59  Pulse: 71  70 66  Resp: 12 19 12 12   Temp:      TempSrc:      SpO2: 99%  97% 99%  Weight:       Body mass index is 26.24 kg/m.  Physical Exam Constitutional:  General: She is not in acute distress.    Appearance: She is well-developed.     Comments: Seated in the chair next to the bed; pleasant.   Cardiovascular:     Rate and Rhythm: Normal rate and regular rhythm.     Heart sounds: Normal heart sounds.  Pulmonary:     Effort: Pulmonary effort is normal.     Breath sounds: Normal breath sounds.  Skin:    General: Skin is warm and dry.  Neurological:     Mental Status: She is alert and oriented to person, place, and time.     Lab Results Lab Results  Component Value Date   WBC 17.8 (H) 04/22/2024   HGB 7.7 (L)  04/22/2024   HCT 23.7 (L) 04/22/2024   MCV 79.5 (L) 04/22/2024   PLT 194 04/22/2024    Lab Results  Component Value Date   CREATININE 7.33 (H) 04/22/2024   BUN 74 (H) 04/22/2024   NA 126 (L) 04/22/2024   K 3.4 (L) 04/22/2024   CL 94 (L) 04/22/2024   CO2 21 (L) 04/22/2024    Lab Results  Component Value Date   ALT 12 04/19/2024   AST 13 (L) 04/19/2024   ALKPHOS 106 04/19/2024   BILITOT 2.0 (H) 04/19/2024     Microbiology: Recent Results (from the past 240 hours)  Resp panel by RT-PCR (RSV, Flu A&B, Covid) Anterior Nasal Swab     Status: None   Collection Time: 04/19/24  1:41 PM   Specimen: Anterior Nasal Swab  Result Value Ref Range Status   SARS Coronavirus 2 by RT PCR NEGATIVE NEGATIVE Final   Influenza A by PCR NEGATIVE NEGATIVE Final   Influenza B by PCR NEGATIVE NEGATIVE Final    Comment: (NOTE) The Xpert Xpress SARS-CoV-2/FLU/RSV plus assay is intended as an aid in the diagnosis of influenza from Nasopharyngeal swab specimens and should not be used as a sole basis for treatment. Nasal washings and aspirates are unacceptable for Xpert Xpress SARS-CoV-2/FLU/RSV testing.  Fact Sheet for Patients: BloggerCourse.com  Fact Sheet for Healthcare Providers: SeriousBroker.it  This test is not yet approved or cleared by the [place]  FDA and has been authorized for detection and/or diagnosis of SARS-CoV-2 by FDA under an Emergency Use Authorization (EUA). This EUA will remain in effect (meaning this test can be used) for the duration of the COVID-19 declaration under Section 564(b)(1) of the Act, 21 U.S.C. section 360bbb-3(b)(1), unless the authorization is terminated or revoked.     Resp Syncytial Virus by PCR NEGATIVE NEGATIVE Final    Comment: (NOTE) Fact Sheet for Patients: BloggerCourse.com  Fact Sheet for Healthcare Providers: SeriousBroker.it  This test  is not yet approved or cleared by the [place]  FDA and has been authorized for detection and/or diagnosis of SARS-CoV-2 by FDA under an Emergency Use Authorization (EUA). This EUA will remain in effect (meaning this test can be used) for the duration of the COVID-19 declaration under Section 564(b)(1) of the Act, 21 U.S.C. section 360bbb-3(b)(1), unless the authorization is terminated or revoked.  Performed at [facility], [address] [place], [place] [address]   Blood Culture (routine x 2)     Status: Abnormal (Preliminary result)   Collection Time: 04/19/24  1:41 PM   Specimen: BLOOD  Result Value Ref Range Status   Specimen Description BLOOD LEFT ANTECUBITAL  Final   Special Requests   Final    BOTTLES DRAWN AEROBIC AND ANAEROBIC Blood Culture results may not  be optimal due to an inadequate volume of blood received in culture bottles   Culture  Setup Time   Final    GRAM POSITIVE COCCI IN BOTH AEROBIC AND ANAEROBIC BOTTLES CRITICAL VALUE NOTED.  VALUE IS CONSISTENT WITH PREVIOUSLY REPORTED AND CALLED VALUE.    Culture (A)  Final    STAPHYLOCOCCUS AUREUS SUSCEPTIBILITIES PERFORMED ON PREVIOUS CULTURE WITHIN THE LAST 5 DAYS. CULTURE REINCUBATED FOR BETTER GROWTH Performed at [facility], [address] [place], [place] [address]    Report Status PENDING  Incomplete  Blood Culture (routine x 2)     Status: Abnormal (Preliminary result)   Collection Time: 04/19/24  1:46 PM   Specimen: BLOOD  Result Value Ref Range Status   Specimen Description BLOOD RIGHT ANTECUBITAL  Final   Special Requests   Final    BOTTLES DRAWN AEROBIC AND ANAEROBIC Blood Culture results may not be optimal due to an inadequate volume of blood received in culture bottles   Culture  Setup Time   Final    GRAM POSITIVE COCCI IN BOTH AEROBIC AND ANAEROBIC BOTTLES CRITICAL RESULT CALLED TO, READ BACK BY AND VERIFIED WITH: PHARMD G ABBOTT 04/20/2024 @ 0539 BY AB    Culture (A)  Final     METHICILLIN RESISTANT STAPHYLOCOCCUS AUREUS CULTURE REINCUBATED FOR BETTER GROWTH Performed at [facility], [address], [place], [place] [address]    Report Status PENDING  Incomplete   Organism ID, Bacteria METHICILLIN RESISTANT STAPHYLOCOCCUS AUREUS  Final      Susceptibility   Methicillin resistant staphylococcus aureus - MIC*    CIPROFLOXACIN  >=8 RESISTANT Resistant     ERYTHROMYCIN  >=8 RESISTANT Resistant     GENTAMICIN  <=0.5 SENSITIVE Sensitive     OXACILLIN >=4 RESISTANT Resistant     TETRACYCLINE <=1 SENSITIVE Sensitive     VANCOMYCIN  1 SENSITIVE Sensitive     TRIMETH/SULFA <=10 SENSITIVE Sensitive     CLINDAMYCIN  >=8 RESISTANT Resistant     RIFAMPIN <=0.5 SENSITIVE Sensitive     Inducible Clindamycin  NEGATIVE Sensitive     LINEZOLID 2 SENSITIVE Sensitive     * METHICILLIN RESISTANT STAPHYLOCOCCUS AUREUS  Blood Culture ID Panel (Reflexed)     Status: Abnormal   Collection Time: 04/19/24  1:46 PM  Result Value Ref Range Status   Enterococcus faecalis NOT DETECTED NOT DETECTED Final   Enterococcus Faecium NOT DETECTED NOT DETECTED Final   Listeria monocytogenes NOT DETECTED NOT DETECTED Final   Staphylococcus species DETECTED (A) NOT DETECTED Final    Comment: CRITICAL RESULT CALLED TO, READ BACK BY AND VERIFIED WITH: PHARMD G ABBOTT 04/20/2024 @ 0539 BY AB    Staphylococcus aureus (BCID) DETECTED (A) NOT DETECTED Final    Comment: Methicillin (oxacillin)-resistant Staphylococcus aureus (MRSA). MRSA is predictably resistant to beta-lactam antibiotics (except ceftaroline). Preferred therapy is vancomycin  unless clinically contraindicated. Patient requires contact precautions if  hospitalized. CRITICAL RESULT CALLED TO, READ BACK BY AND VERIFIED WITH: PHARMD G ABBOTT 04/20/2024 @ 0539 BY AB    Staphylococcus epidermidis NOT DETECTED NOT DETECTED Final   Staphylococcus lugdunensis NOT DETECTED NOT DETECTED Final   Streptococcus species DETECTED (A) NOT DETECTED  Final    Comment: CRITICAL RESULT CALLED TO, READ BACK BY AND VERIFIED WITH: PHARMD G ABBOTT 04/20/2024 @ 0539 BY AB    Streptococcus agalactiae DETECTED (A) NOT DETECTED Final    Comment: CRITICAL RESULT CALLED TO, READ BACK BY AND VERIFIED WITH: PHARMD G ABBOTT 04/20/2024 @ 0539 BY AB  Streptococcus pneumoniae NOT DETECTED NOT DETECTED Final   Streptococcus pyogenes NOT DETECTED NOT DETECTED Final   A.calcoaceticus-baumannii NOT DETECTED NOT DETECTED Final   Bacteroides fragilis NOT DETECTED NOT DETECTED Final   Enterobacterales NOT DETECTED NOT DETECTED Final   Enterobacter cloacae complex NOT DETECTED NOT DETECTED Final   Escherichia coli NOT DETECTED NOT DETECTED Final   Klebsiella aerogenes NOT DETECTED NOT DETECTED Final   Klebsiella oxytoca NOT DETECTED NOT DETECTED Final   Klebsiella pneumoniae NOT DETECTED NOT DETECTED Final   Proteus species NOT DETECTED NOT DETECTED Final   Salmonella species NOT DETECTED NOT DETECTED Final   Serratia marcescens NOT DETECTED NOT DETECTED Final   Haemophilus influenzae NOT DETECTED NOT DETECTED Final   Neisseria meningitidis NOT DETECTED NOT DETECTED Final   Pseudomonas aeruginosa NOT DETECTED NOT DETECTED Final   Stenotrophomonas maltophilia NOT DETECTED NOT DETECTED Final   Candida albicans NOT DETECTED NOT DETECTED Final   Candida auris NOT DETECTED NOT DETECTED Final   Candida glabrata NOT DETECTED NOT DETECTED Final   Candida krusei NOT DETECTED NOT DETECTED Final   Candida parapsilosis NOT DETECTED NOT DETECTED Final   Candida tropicalis NOT DETECTED NOT DETECTED Final   Cryptococcus neoformans/gattii NOT DETECTED NOT DETECTED Final   Meth resistant mecA/C and MREJ DETECTED (A) NOT DETECTED Final    Comment: CRITICAL RESULT CALLED TO, READ BACK BY AND VERIFIED WITH: PHARMD G ABBOTT 04/20/2024 @ 0539 BY AB Performed at [facility], [address], [place], [place] [address]   MRSA Next Gen by PCR, Nasal     Status: None    Collection Time: 04/19/24  7:17 PM   Specimen: Nasal Mucosa; Nasal Swab  Result Value Ref Range Status   MRSA by PCR Next Gen NOT DETECTED NOT DETECTED Final    Comment: (NOTE) The GeneXpert MRSA Assay (FDA approved for NASAL specimens only), is one component of a comprehensive MRSA colonization surveillance program. It is not intended to diagnose MRSA infection nor to guide or monitor treatment for MRSA infections. Test performance is not FDA approved in patients less than 6 years old. Performed at [facility], [address], [place], [place] [address]   Aerobic/Anaerobic Culture w Gram Stain (surgical/deep wound)     Status: None (Preliminary result)   Collection Time: 04/20/24  6:22 PM   Specimen: Soft Tissue, Other  Result Value Ref Range Status   Specimen Description TISSUE  Final   Special Requests RIGHT LEG  Final   Gram Stain NO WBC SEEN NO ORGANISMS SEEN   Final   Culture   Final    NO GROWTH 2 DAYS Performed at [facility], [address] [place], [place] [address]    Report Status PENDING  Incomplete  Culture, blood (Routine X 2) w Reflex to ID Panel     Status: None (Preliminary result)   Collection Time: 04/21/24  6:16 AM   Specimen: BLOOD  Result Value Ref Range Status   Specimen Description BLOOD BLOOD LEFT HAND  Final   Special Requests   Final    BOTTLES DRAWN AEROBIC AND ANAEROBIC Blood Culture adequate volume   Culture   Final    NO GROWTH 1 DAY Performed at [facility], [address], [place], [place] [address]    Report Status PENDING  Incomplete  Culture, blood (Routine X 2) w Reflex to ID Panel     Status: None (Preliminary result)   Collection Time: 04/21/24  6:21 AM   Specimen: BLOOD  Result Value Ref Range Status   Specimen Description BLOOD BLOOD RIGHT ARM  Final   Special Requests   Final    BOTTLES DRAWN AEROBIC AND ANAEROBIC Blood Culture adequate volume   Culture   Final    NO GROWTH 1 DAY Performed at [facility], [address] [place], [place] [address]    Report Status PENDING  Incomplete     Cathlyn July, NP [facility]  04/22/2024  2:40 PM

## 2024-04-22 NOTE — Progress Notes (Signed)
 Orthopedic Tech Progress Note Patient Details:  [name] 26-Mar-1956 [id number]  Called in order to [employer] for a BKA SHRINKER  Patient ID: [name], female   DOB: 08/11/1956, 68 y.o.   MRN: [id number]  [name] Pac 04/22/2024, 6:23 PM

## 2024-04-22 NOTE — NC FL2 (Signed)
 [place]  MEDICAID FL2 LEVEL OF CARE FORM     IDENTIFICATION  Patient Name: [name] Birthdate: [birth year] Sex: female Admission Date (Current Location): 04/19/2024  [facility] and [place] Number:  Producer, television/film/video and Address:  [facility], [address], [place], [place] [address]      Provider Number: [id number]  Attending Physician Name and Address:  Annella Donnice SAUNDERS, MD  Relative Name and Phone Number:  [name] Neighbors; [place]; [phone]    Current Level of Care: Hospital Recommended Level of Care: Skilled Nursing Facility Prior Approval Number:    Date Approved/Denied:   PASRR Number: [id number]  Discharge Plan: SNF    Current Diagnoses: Patient Active Problem List   Diagnosis Date Noted   Subacute osteomyelitis, right ankle and foot (HCC) 04/20/2024   MRSA bacteremia 04/20/2024   Bacteremia 04/20/2024   Necrotizing fasciitis (HCC) 04/20/2024   ESRD (end stage renal disease) (HCC) 04/20/2024   Septic shock (HCC) 04/19/2024   Irritant contact dermatitis associated with fecal stoma 12/27/2023   Colostomy complication, unspecified (HCC) 12/27/2023   Diverticulitis of large intestine with complication 10/27/2023   Chronic kidney disease, stage 5 (HCC) 10/27/2023   Chronic nonintractable headache 03/03/2021   Foot callus 02/27/2021   Counseled about COVID-19 virus infection 02/24/2020   Aortic heart murmur 05/15/2019   Thyroid  enlargement 07/19/2014   Vitamin D  deficiency 09/03/2013   Hyperlipidemia 04/24/2013   Malunion of fracture 03/17/2013   Wound infection after surgery 03/17/2013   Diabetic retinopathy (HCC) 01/23/2013   Chronic atrial fibrillation (HCC) 01/15/2013   CAD (coronary artery disease) 01/11/2013   H/O non-ST elevation myocardial infarction (NSTEMI) 01/09/2013   History of stroke 01/08/2013   Diabetic neuropathy 01/08/2013   Hypertension    Diabetes mellitus    Chronic kidney disease (CKD), stage IV  (severe) (HCC)     Orientation RESPIRATION BLADDER Height & Weight     Self, Time, Situation, Place  Normal (Room Air) Continent Weight: 167 lb 8.8 oz (76 kg) Height:     BEHAVIORAL SYMPTOMS/MOOD NEUROLOGICAL BOWEL NUTRITION STATUS      Colostomy Diet (Please see discharge summary)  AMBULATORY STATUS COMMUNICATION OF NEEDS Skin   Extensive Assist Verbally Wound Vac, Surgical wounds, PU Stage and Appropriate Care, Other (Comment) (Negative Pressure Wound Therapy Abdomen Mid; Closed Surgical Incision Knee Right; Pressure Injury Sacrum Stage 2; Skin tear;Irritant Dermatitis (Moisture Associated Skin Damage) Buttocks)                       Personal Care Assistance Level of Assistance  Bathing, Feeding, Dressing Bathing Assistance: Maximum assistance Feeding assistance: Maximum assistance Dressing Assistance: Maximum assistance     Functional Limitations Info  Sight Sight Info: Impaired (R and L)        SPECIAL CARE FACTORS FREQUENCY  PT (By licensed PT), OT (By licensed OT)     PT Frequency: 5x OT Frequency: 5x            Contractures Contractures Info: Not present    Additional Factors Info  Code Status, Isolation Precautions, Allergies, Insulin  Sliding Scale Code Status Info: DNR-LIMITED -Do Not Intubate/DNI Allergies Info: Nsaids; Lisinopril; Peanut-containing Drug Products   Insulin  Sliding Scale Info: Please see discharge summary Isolation Precautions Info: Contact Precautions (MRSA)     Current Medications (04/22/2024):  This is the current hospital active medication list Current Facility-Administered Medications  Medication Dose Route Frequency Provider Last Rate Last Admin   acetaminophen  (TYLENOL )  tablet 650 mg  650 mg Oral Q6H PRN Amoako, Prince, MD   650 mg at 04/21/24 1341   ascorbic acid  (VITAMIN C ) tablet 1,000 mg  1,000 mg Oral Daily Duda, Marcus V, MD   1,000 mg at 04/22/24 1205   brimonidine  (ALPHAGAN ) 0.2 % ophthalmic solution 1 drop  1 drop  Both Eyes BID Paliwal, Aditya, MD   1 drop at 04/22/24 1210   [START ON 04/23/2024] ceFAZolin  (ANCEF ) IVPB 2g/100 mL premix  2 g Intravenous Once Omohundro, Jennifer C, NP       Chlorhexidine  Gluconate Cloth 2 % PADS 6 each  6 each Topical Daily Jude Harden GAILS, MD   6 each at 04/21/24 0804   Chlorhexidine  Gluconate Cloth 2 % PADS 6 each  6 each Topical Q0600 Harden Jerona GAILS, MD   6 each at 04/22/24 0400   docusate sodium  (COLACE) capsule 100 mg  100 mg Oral BID PRN Harden Jerona GAILS, MD       fentaNYL  (SUBLIMAZE ) injection 12.5 mcg  12.5 mcg Intravenous Q4H PRN Renne Homans, MD       heparin  injection 1,000 Units  1,000 Units Dialysis PRN Macel Jayson PARAS, MD       insulin  aspart (novoLOG ) injection 0-15 Units  0-15 Units Subcutaneous TID WC Merilee Linsey I, RPH   2 Units at 04/22/24 9162   insulin  glargine-yfgn (SEMGLEE ) injection 20 Units  20 Units Subcutaneous QHS Harden Jerona GAILS, MD   20 Units at 04/21/24 2143   lidocaine  (PF) (XYLOCAINE ) 1 % injection 5 mL  5 mL Intradermal PRN Macel Jayson PARAS, MD       lidocaine -prilocaine  (EMLA ) cream 1 Application  1 Application Topical PRN Peeples, Samuel J, MD       midodrine  (PROAMATINE ) tablet 10 mg  10 mg Oral TID WC Duda, Marcus V, MD   10 mg at 04/22/24 1205   norepinephrine  (LEVOPHED ) 4mg  in (0.016 mg/mL) premix infusion  0-40 mcg/min Intravenous Continuous Hunsucker, Donnice SAUNDERS, MD 7.5 mL/hr at 04/22/24 0800 2 mcg/min at 04/22/24 0800   nutrition supplement (JUVEN) (JUVEN) powder packet 1 packet  1 packet Oral BID BM Duda, Marcus V, MD   1 packet at 04/22/24 1205   Oral care mouth rinse  15 mL Mouth Rinse PRN Harden Jerona GAILS, MD       oxyCODONE  (Oxy IR/ROXICODONE ) immediate release tablet 5 mg  5 mg Oral Q6H PRN Amoako, Prince, MD   5 mg at 04/22/24 9162   pentafluoroprop-tetrafluoroeth (GEBAUERS) aerosol 1 Application  1 Application Topical PRN Peeples, Samuel J, MD       polyethylene glycol (MIRALAX  / GLYCOLAX ) packet 17 g  17 g Oral Daily  PRN Harden Jerona GAILS, MD       vancomycin  variable dose per unstable renal function (pharmacist dosing)   Does not apply See admin instructions Merilee Linsey I, RPH       zinc  sulfate (50mg  elemental zinc ) capsule 220 mg  220 mg Oral Daily Duda, Marcus V, MD   220 mg at 04/22/24 1205     Discharge Medications: Please see discharge summary for a list of discharge medications.  Relevant Imaging Results:  Relevant Lab Results:   Additional Information SS# [id number]; Wound Vac; [facility] [place] [facility] MWF  Lauraine FORBES Saa, LCSW

## 2024-04-22 NOTE — TOC Progression Note (Signed)
 Transition of Care [facility]) - Progression Note    Patient Details  Name: [name] MRN: [id number] Date of Birth: 28-Aug-1956  [facility] CM/SW Contact  Lauraine FORBES Saa, LCSW Phone Number: 04/22/2024, 1:02 PM  Clinical Narrative:     1:02 PM CSW confirmed with patient interest in discharging to SNF LTC within [facility]. CSW sent referrals to SNFs and will continue to follow.  Expected Discharge Plan: Skilled Nursing Facility Barriers to Discharge: Continued Medical Work up               Expected Discharge Plan and Services In-house Referral: Clinical Social Work   Post Acute Care Choice: Skilled Nursing Facility Living arrangements for the past 2 months: Single Family Home                                       Social Drivers of Health (SDOH) Interventions SDOH Screenings   Food Insecurity: No Food Insecurity (04/19/2024)  Housing: Low Risk  (04/19/2024)  Transportation Needs: No Transportation Needs (04/19/2024)  Utilities: Not At Risk (04/19/2024)  Depression (PHQ2-9): Low Risk  (02/28/2021)  Financial Resource Strain: Low Risk  (08/25/2021)   Received from [facility]  Physical Activity: Insufficiently Active (06/20/2021)   Received from [facility]  Social Connections: Socially Isolated (04/19/2024)  Stress: No Stress Concern Present (08/25/2021)   Received from [facility]  Tobacco Use: Low Risk  (03/26/2024)    Readmission Risk Interventions     No data to display

## 2024-04-22 NOTE — Progress Notes (Signed)
 Physical Therapy Treatment Patient Details Name: [name] MRN: [id number] DOB: 1956/06/16 Today's Date: 04/22/2024   History of Present Illness 68 yo female adm 04/19/24 with sepsis, hypotension and Rt food wound. 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 PMHx: ESRD on HD, colostomy, T2DM, Afib, Rt ankle ORIF, CAD    PT Comments  PT returned to assist patient back to bed. Pt remains lethargic and nauseated. Pt heavy modAx2 for std pvt back to bed from chair with face to face transfer using bed pad under pt bottom. Acute PT to cont to follow.    If plan is discharge home, recommend the following: A lot of help with walking and/or transfers;A lot of help with bathing/dressing/bathroom;Help with stairs or ramp for entrance;Assist for transportation   Can travel by private vehicle        Equipment Recommendations  Wheelchair (measurements PT);Wheelchair cushion (measurements PT);Rolling walker (2 wheels)    Recommendations for Other Services       Precautions / Restrictions Precautions Precautions: Fall;Other (comment) (new R BKA) Recall of Precautions/Restrictions: Impaired Restrictions Weight Bearing Restrictions Per Provider Order: Yes RLE Weight Bearing Per Provider Order: Non weight bearing Other Position/Activity Restrictions: wound vac to R residual limb     Mobility  Bed Mobility Overal bed mobility: Needs Assistance Bed Mobility: Sit to Supine     Supine to sit: Mod assist Sit to supine: Mod assist   General bed mobility comments: assist for LE management back into bed, verbal cues to scoot hips to the R/center of bed    Transfers Overall transfer level: Needs assistance Equipment used: 2 person hand held assist (face to face transfer with use of bed pad) Transfers: Sit to/from Stand, Bed to chair/wheelchair/BSC Sit to Stand: Mod assist, +2 physical assistance Stand pivot transfers: +2 safety/equipment, Mod assist, +2 physical assistance         General transfer comment: Pt with  good power up from EOB with mod A of 2 with use of bed pad and face to face transfer due to pt with colostomy and port in chest and unable to safely use a gait belt    Ambulation/Gait               General Gait Details: unable at this time   Stairs             Wheelchair Mobility     Tilt Bed    Modified Rankin (Stroke Patients Only)       Balance Overall balance assessment: Needs assistance Sitting-balance support: Bilateral upper extremity supported, Feet supported Sitting balance-Leahy Scale: Fair Sitting balance - Comments: statically   Standing balance support: Bilateral upper extremity supported, During functional activity Standing balance-Leahy Scale: Poor Standing balance comment: reliant on external support                            Communication Communication Communication: No apparent difficulties  Cognition Arousal: Alert (but more flat today compared yesterday, pt reports nausea) Behavior During Therapy: WFL for tasks assessed/performed, Anxious   PT - Cognitive impairments: No apparent impairments                         Following commands: Impaired Following commands impaired: Follows one step commands with increased time, Follows multi-step commands inconsistently    Cueing Cueing Techniques: Verbal cues, Gestural cues, Tactile cues  Exercises General Exercises - Lower Extremity Long Arc Quad: Both, 10 reps, Seated  Hip ABduction/ADduction: AROM, Both, 10 reps, Seated Hip Flexion/Marching: AROM, Both, 10 reps, Seated    General Comments General comments (skin integrity, edema, etc.): VSS, continues with nausea      Pertinent Vitals/Pain Pain Assessment Pain Assessment: Faces Faces Pain Scale: Hurts little more Pain Location: RLE, bottom Pain Descriptors / Indicators: Aching    Home Living                          Prior Function            PT Goals (current goals can now be found in the care  plan section) Acute Rehab PT Goals Patient Stated Goal: get better PT Goal Formulation: With patient Time For Goal Achievement: 05/05/24 Potential to Achieve Goals: Good Progress towards PT goals: Progressing toward goals    Frequency    Min 3X/week      PT Plan      Co-evaluation              AM-PAC PT 6 Clicks Mobility   Outcome Measure  Help needed turning from your back to your side while in a flat bed without using bedrails?: A Lot Help needed moving from lying on your back to sitting on the side of a flat bed without using bedrails?: A Lot Help needed moving to and from a bed to a chair (including a wheelchair)?: A Lot Help needed standing up from a chair using your arms (e.g., wheelchair or bedside chair)?: A Lot Help needed to walk in hospital room?: Total Help needed climbing 3-5 steps with a railing? : Total 6 Click Score: 10    End of Session   Activity Tolerance: Patient tolerated treatment well Patient left: with call bell/phone within reach;in bed Nurse Communication: Mobility status PT Visit Diagnosis: Difficulty in walking, not elsewhere classified (R26.2)     Time: 8747-8692 PT Time Calculation (min) (ACUTE ONLY): 15 min  Charges:    $Therapeutic Exercise: 8-22 mins $Therapeutic Activity: 8-22 mins PT General Charges $$ ACUTE PT VISIT: 1 Visit                     Norene Ames, PT, DPT Acute Rehabilitation Services Secure chat preferred Office #: [phone]    Norene CHRISTELLA Ames 04/22/2024, 1:13 PM

## 2024-04-22 NOTE — Plan of Care (Signed)
  Problem: Clinical Measurements: Goal: Ability to maintain clinical measurements within normal limits will improve Outcome: Progressing Goal: Will remain free from infection Outcome: Progressing Goal: Diagnostic test results will improve Outcome: Progressing Goal: Respiratory complications will improve Outcome: Progressing Goal: Cardiovascular complication will be avoided Outcome: Progressing   Problem: Activity: Goal: Risk for activity intolerance will decrease Outcome: Progressing   Problem: Nutrition: Goal: Adequate nutrition will be maintained Outcome: Not Progressing   Problem: Coping: Goal: Level of anxiety will decrease Outcome: Not Progressing   Problem: Coping: Goal: Ability to adjust to condition or change in health will improve Outcome: Not Progressing

## 2024-04-22 NOTE — Progress Notes (Signed)
 NAME:  [name], MRN:  [id number], DOB:  Apr 23, 1956, LOS: 3 ADMISSION DATE:  04/19/2024, CONSULTATION DATE:  04/17/2024 REFERRING MD: EDP CHIEF COMPLAINT: Hypotension  History of Present Illness:  68 year old woman with end-stage renal disease on HD who initially presented with general weakness likely due to sepsis and also found to have osteomyelitis of the right fibula and septic arthritis with exposure of medullary bone of air on x-ray is concerning for necrotizing fasciitis.  Orthopedics was consulted who is recommending transtibial amputation on the right. S/p right BKA.  Pertinent  Medical History   Past Medical History:  Diagnosis Date   Anemia    Arthritis    Atrial fibrillation (HCC)    CAD (coronary artery disease)    Chronic kidney disease, stage III (moderate) (HCC)    Constipation    Diabetes mellitus (HCC)    History of blood transfusion    HLD (hyperlipidemia)    Hypertension    Myocardial infarction [facility])    in April 2014     Significant Hospital Events: Including procedures, antibiotic start and stop dates in addition to other pertinent events   Patient did not get dialyzed yesterday because her right IJ was not patent and tPA did not work.  Her pain was however well-controlled patient does not have any complaints this morning. She was able to work with PT yesterday and did very well.  PT will continue to follow  Interim History / Subjective:  7/27 : Admitted 7/28 : ABIs ordered, orthopedics consulted, planning for transtibial amputation 7/28 : Right BKA 7/29 : POD 1 Right BKA   Objective   Blood pressure 114/67, pulse 69, temperature 97.8 F (36.6 C), temperature source Oral, resp. rate 12, weight 76 kg, SpO2 94%.        Intake/Output Summary (Last 24 hours) at 04/22/2024 0947 Last data filed at 04/22/2024 0800 Gross per 24 hour  Intake 734.55 ml  Output 125 ml  Net 609.55 ml   Filed Weights   04/20/24 0449 04/21/24 1254  Weight: 73 kg 76 kg     Examination: General: Chronically ill-appearing, laying in bed in no acute distress HENT: Right IJ tunneled catheter in place Lungs: On room air, lungs are clear to auscultation bilaterally Cardiovascular: Regular rate and rhythm Abdomen: Soft nontender, no guarding or rebound tenderness Extremities: Warm and dry Neuro: Alert, responding to questions appropriately, oriented to self time and place GU: Deferred MSK : Right BKA site looks clean/dry and intact   Resolved Hospital Problem list   N/A  Assessment & Plan:  Septic shock MRSA/Group B Strep Bacteremia  -  Likely in the setting of osteomyelitis and septic arthritis -  Blood cultures grew MRSA and group B strep -  Source controlled , TTE negative  -  Continue vancomycin  for bacteremia  -  Wean off Levofed as tolerated.  Right ankle wound - Postop day 2 right BKA - Pain well  controlled  with Tylenol  and fentanyl  and Oxycodone  PRN  - Continue PT/OT   End-stage renal disease on hemodialysis - Replace TDC tomorrow  - Hold Eliquis  for [facility] placement tomorrow  Type 2 diabetes -Moderate SSI - Semglee  20 units @10  pm  - CBG monitoring  DVT ppx - Hold Eliquis  it today  Best Practice (right click and Reselect all SmartList Selections daily)   Diet/type: NPO w/ oral meds DVT prophylaxis: DOAC GI prophylaxis: N/A Lines: Central line Foley:  N/A Code Status:  DNR Last date of multidisciplinary goals  of care discussion [as above]  Labs   CBC: Recent Labs  Lab 04/19/24 1341 04/20/24 0340 04/21/24 0520 04/22/24 0500  WBC 28.5* 30.2* 19.1* 17.8*  NEUTROABS 27.4*  --   --   --   HGB 9.4* 9.1* 7.8* 7.7*  HCT 29.2* 27.5* 23.7* 23.7*  MCV 82.7 79.5* 78.7* 79.5*  PLT 225 244 156 194    Basic Metabolic Panel: Recent Labs  Lab 04/19/24 1341 04/19/24 2016 04/20/24 0340 04/21/24 0520 04/22/24 0500  NA 128* 130* 128* 128* 126*  K 3.9 3.8 3.3* 3.4* 3.4*  CL 92* 94* 92* 95* 94*  CO2 17* 17* 21* 22 21*   GLUCOSE 391* 436* 374* 124* 142*  BUN 54* 51* 55* 58* 74*  CREATININE 7.17* 6.96* 6.89* 6.86* 7.33*  CALCIUM  8.0* 7.9* 8.1* 8.0* 7.6*  MG  --   --  1.9 2.0  --   PHOS  --   --  3.9 4.2 4.3   GFR: Estimated Creatinine Clearance: 7.8 mL/min (A) (by C-G formula based on SCr of 7.33 mg/dL (H)). Recent Labs  Lab 04/19/24 1341 04/19/24 1409 04/19/24 2016 04/20/24 0340 04/21/24 0520 04/22/24 0500  WBC 28.5*  --   --  30.2* 19.1* 17.8*  LATICACIDVEN  --  2.6* 1.5  --   --   --     Liver Function Tests: Recent Labs  Lab 04/19/24 1341 04/22/24 0500  AST 13*  --   ALT 12  --   ALKPHOS 106  --   BILITOT 2.0*  --   PROT 5.4*  --   ALBUMIN  1.5* <1.5*   No results for input(s): LIPASE, AMYLASE in the last 168 hours. No results for input(s): AMMONIA in the last 168 hours.  ABG    Component Value Date/Time   HCO3 29.4 (H) 08/01/2010 0805   TCO2 21 01/08/2013 1242   ACIDBASEDEF 7.0 (H) 06/14/2009 1616   O2SAT 59.0 08/01/2010 0805     Coagulation Profile: Recent Labs  Lab 04/19/24 1341  INR 1.1    Cardiac Enzymes: No results for input(s): CKTOTAL, CKMB, CKMBINDEX, TROPONINI in the last 168 hours.  HbA1C: HbA1c, POC (controlled diabetic range)  Date/Time Value Ref Range Status  02/28/2021 02:50 PM 6.4 0.0 - 7.0 % Final  06/16/2020 01:52 PM 6.6 0.0 - 7.0 % Final   Hgb A1c MFr Bld  Date/Time Value Ref Range Status  10/28/2023 05:22 AM 6.6 (H) 4.8 - 5.6 % Final    Comment:    (NOTE) Pre diabetes:          5.7%-6.4%  Diabetes:              >6.4%  Glycemic control for   <7.0% adults with diabetes     CBG: Recent Labs  Lab 04/21/24 1103 04/21/24 1631 04/21/24 1911 04/21/24 2108 04/22/24 0804  GLUCAP 94 159* 147* 158* 130*    Review of Systems:   Endorses generalized weakness.  Denies any chest pain or shortness of breath  Past Medical History:  She,  has a past medical history of Anemia, Arthritis, Atrial fibrillation (HCC), CAD  (coronary artery disease), Chronic kidney disease, stage III (moderate) (HCC), Constipation, Diabetes mellitus (HCC), History of blood transfusion, HLD (hyperlipidemia), Hypertension, and Myocardial infarction (HCC).   Surgical History:   Past Surgical History:  Procedure Laterality Date   AMPUTATION Right 04/20/2024   Procedure: AMPUTATION BELOW KNEE;  Surgeon: Harden Jerona GAILS, MD;  Location: [facility] OR;  Service: Orthopedics;  Laterality: Right;  ANKLE SURGERY Right    x 6 total   BREAST BIOPSY Left    15 or 20 yearsa ago she cant remember laterality    HARDWARE REMOVAL Right 02/13/2013   Procedure: HARDWARE REMOVAL;  Surgeon: Jerona LULLA Sage, MD;  Location: [facility] OR;  Service: Orthopedics;  Laterality: Right;  Right Ankle Removal Hardware, Debridement, Place Wound VAC   IR FLUORO GUIDE CV LINE LEFT  11/08/2023   IR FLUORO GUIDE CV LINE RIGHT  11/01/2023   IR FLUORO GUIDE CV LINE RIGHT  11/12/2023   IR REMOVAL TUN CV CATH W/O FL  04/21/2024   IR US  GUIDE VASC ACCESS LEFT  11/08/2023   IR US  GUIDE VASC ACCESS RIGHT  11/01/2023   IR US  GUIDE VASC ACCESS RIGHT  11/12/2023   laser surgery for diabetic retinopathy Bilateral    LEFT HEART CATHETERIZATION WITH CORONARY ANGIOGRAM N/A 01/09/2013   Procedure: LEFT HEART CATHETERIZATION WITH CORONARY ANGIOGRAM;  Surgeon: Toribio JONELLE Fuel, MD;  Location[facility] C[facility] LAB;  Service: Cardiovascular;  Laterality: N/A;   ORIF ANKLE FRACTURE Right 01/12/2013   Procedure: OPEN REDUCTION INTERNAL FIXATION (ORIF) ANKLE FRACTURE;  Surgeon: Jerona LULLA Sage, MD;  Location: [facility] OR;  Service: Orthopedics;  Laterality: Right;   PARTIAL COLECTOMY N/A 11/02/2023   Procedure: OPEN PARTIAL COLECTOMY WITH END COLOSTOMY;  Surgeon: Dasie Leonor CROME, MD;  Location[facility] OR;  Service: General;  Laterality: N/A;   PARTIAL HYSTERECTOMY     TUNNELLED CATHETER EXCHANGE N/A 04/15/2024   Procedure: TUNNELLED CATHETER EXCHANGE;  Surgeon: Pearline Norman RAMAN, MD;  Location: [facility] LAB;  Service: Cardiovascular;   Laterality: N/A;     Social History:   reports that she has never smoked. She has never used smokeless tobacco. She reports that she does not drink alcohol  and does not use drugs.   Family History:  Her family history includes Cervical cancer in her sister; Colon cancer (age of onset: 73) in her mother; Congestive Heart Failure in her sister; Diabetes in her brother, mother, and sister; Heart disease in her father and maternal grandmother; Hypertension in her father; Kidney disease in her brother; Sickle cell anemia in her son; Thyroid  disease in her son. There is no history of Breast cancer or BRCA 1/2.   Allergies Allergies  Allergen Reactions   Nsaids Other (See Comments)    CKD stage 4   Lisinopril Other (See Comments)    coughing   Peanut-Containing Drug Products Itching and Other (See Comments)    GI intolerance - diarrhea     Home Medications  Prior to Admission medications   Medication Sig Start Date End Date Taking? Authorizing Provider  albuterol  (PROVENTIL ) (2.5 MG/3ML) 0.083% nebulizer solution Take 3 mLs (2.5 mg total) by nebulization every 2 (two) hours as needed for wheezing. 11/25/23  Yes Ghimire, Donalda HERO, MD  apixaban  (ELIQUIS ) 2.5 MG TABS tablet Take 1 tablet (2.5 mg total) by mouth 2 (two) times daily. 10/04/22  Yes Acharya, Gayatri A, MD  brimonidine  (ALPHAGAN ) 0.2 % ophthalmic solution Place 1 drop into both eyes 2 (two) times daily.   Yes [provider]  calcitRIOL  (ROCALTROL ) 0.25 MCG capsule TAKE 1 CAPSULE (0.25 MCG TOTAL) EVERY MONDAY, WEDNESDAY, AND FRIDAY Patient taking differently: Take 0.25 mcg by mouth in the morning and at bedtime. 06/22/22  Yes Christia Budds, MD  carvedilol  (COREG ) 12.5 MG tablet TAKE 1 TABLET (12.5 MG TOTAL) BY MOUTH 2 (TWO) TIMES DAILY WITH A MEAL. 12/22/21  Yes Autry-Lott, Rojean, DO  insulin  glargine (LANTUS ) 100 UNIT/ML injection Inject 0.07 mLs (7 Units total) into the skin daily. Please schedule appointment before next  refill. Patient taking differently: Inject 10 Units into the skin daily. Please schedule appointment before next refill. 02/01/24  Yes [name], MD  latanoprost  (XALATAN ) 0.005 % ophthalmic solution Place 1 drop into both eyes at bedtime.   Yes [provider]  melatonin 5 MG TABS Take 10 mg by mouth at bedtime as needed (for insomnia).   Yes [provider]  midodrine  (PROAMATINE ) 2.5 MG tablet Take 2.5 mg by mouth.   Yes [provider]  Multiple Vitamin (MULTIVITAMIN WITH MINERALS) TABS Take 1 tablet by mouth daily.   Yes [provider]  NOVOLIN R 100 UNIT/ML injection Inject 0-9 Units into the skin in the morning, at noon, and at bedtime. Per sliding scale 04/03/24  Yes [provider]  ACCU-CHEK AVIVA PLUS test strip CHECK BLOOD GLUCOSE THREE TIMES DAILY 11/05/22   Joshua Domino, DO  amLODipine  (NORVASC ) 5 MG tablet Take 0.5 tablets (2.5 mg total) by mouth at bedtime. Patient not taking: Reported on 03/26/2024 02/01/24 03/26/24  [name], MD  Blood Glucose Monitoring Suppl (ACCU-CHEK AVIVA PLUS) w/Device KIT CHECK BLOOD GLUCOSE THREE TIMES DAILY 10/26/20   Autry-Lott, Rojean, DO  collagenase  (SANTYL ) 250 UNIT/GM ointment Apply 1 Application topically daily. Apply nickel thick amount to ulcer Right lateral ankle 3 x 2 cm with muscle/fascia capsule exposed Patient not taking: Reported on 04/19/2024 04/15/24 05/15/24  Lamount Ethan CROME, DPM  DROPLET INSULIN  SYRINGE 31G X 5/16 0.5 ML MISC USE FOUR TIMES DAILY FOR INJECTIONS 09/03/22   Joshua Domino, DO  furosemide  (LASIX ) 40 MG tablet Take 40 mg by mouth 2 (two) times daily. Patient not taking: Reported on 04/19/2024    [provider]  insulin  aspart (NOVOLOG  FLEXPEN) 100 UNIT/ML FlexPen 0-9 Units, Subcutaneous, 3 times daily with meals CBG < 70: Implement Hypoglycemia measures CBG 70 - 120: 0 units CBG 121 - 150: 1 unit CBG 151 - 200: 2 units CBG 201 - 250: 3 units CBG 251 - 300: 5 units CBG  301 - 350: 7 units CBG 351 - 400: 9 units CBG > 400: call MD Patient not taking: Reported on 04/19/2024 11/25/23   Raenelle Donalda HERO, MD  Insulin  Syringes, Disposable, U-100 0.5 ML MISC 4x daily injections 03/21/18   Riccio, Angela C, DO  Lancet Devices (ACCU-CHEK SOFTCLIX) lancets Fill for 1 month for TID testing. Use as instructed 11/13/13   Curtis Hadassah DASEN, MD  NEEDLE, DISP, 30 G (BD DISP NEEDLES) 30G X 1/2 MISC For 4x daily injections 11/05/15   Phelps, Jazma Y, DO  REPATHA SURECLICK 140 MG/ML SOAJ Inject 140 mg into the skin every 14 (fourteen) days. Patient not taking: Reported on 03/26/2024 09/16/23   [provider]  VITAMIN D , CHOLECALCIFEROL, PO Take 1 tablet by mouth daily. Patient not taking: Reported on 04/19/2024    [provider]     Critical care time: 40 minutes      Drue Grow MD [facility] Internal Medicine Program - PGY-2 04/22/2024, 9:47 AM Pager# [phone]

## 2024-04-22 NOTE — Progress Notes (Signed)
 Inpatient Rehab Admissions Coordinator:    CIR consult received. Unfortunately, payor will not approve CIR for a BKA. I will not pursue admit.   Leita Kleine, MS, CCC-SLP Rehab Admissions Coordinator  [phone] (celll) [phone] (office)

## 2024-04-22 NOTE — Consult Note (Signed)
 Chief Complaint: ESRD in need of HD access. Request is for [facility] placement  Referring Physician(s): Dr. Donnice Beals  Supervising Physician: Jennefer Rover  Patient Status: [facility] - In-pt  History of Present Illness: [name] is a 68 y.o. female inpatient. History of DM,  ESRD on HD via a RIJ TDC. Presented to the ED at [facility] on 7.27.25. Found ot be septic with necrotizing fascitis of the right lower extremity. Blood cultures from showed staph and strep on 7.28.25. Team requested a line holiday and IR removed the RIJ line on 7.29.25.  Patient alert and laying in bed,calm Endorses nausea. Denies any fevers, headache, chest pain, SOB, cough, abdominal pain, vomiting or bleeding.   BUN 74, Cr 7.33. Albumin  < 1.5, BC 17.8 ( downtrending) Hgb 7.7.   Return precautions and treatment recommendations and follow-up discussed with the patient  who is agreeable with the plan.     Past Medical History:  Diagnosis Date   Anemia    Arthritis    Atrial fibrillation (HCC)    CAD (coronary artery disease)    Chronic kidney disease, stage III (moderate) (HCC)    Constipation    Diabetes mellitus (HCC)    History of blood transfusion    HLD (hyperlipidemia)    Hypertension    Myocardial infarction [facility])    in April 2014    Past Surgical History:  Procedure Laterality Date   AMPUTATION Right 04/20/2024   Procedure: AMPUTATION BELOW KNEE;  Surgeon: Harden Jerona GAILS, MD;  Location: [facility] OR;  Service: Orthopedics;  Laterality: Right;   ANKLE SURGERY Right    x 6 total   BREAST BIOPSY Left    15 or 20 yearsa ago she cant remember laterality    HARDWARE REMOVAL Right 02/13/2013   Procedure: HARDWARE REMOVAL;  Surgeon: Jerona GAILS Harden, MD;  Location: [facility] OR;  Service: Orthopedics;  Laterality: Right;  Right Ankle Removal Hardware, Debridement, Place Wound VAC   IR FLUORO GUIDE CV LINE LEFT  11/08/2023   IR FLUORO GUIDE CV LINE RIGHT  11/01/2023   IR FLUORO GUIDE CV LINE RIGHT  11/12/2023    IR REMOVAL TUN CV CATH W/O FL  04/21/2024   IR US  GUIDE VASC ACCESS LEFT  11/08/2023   IR US  GUIDE VASC ACCESS RIGHT  11/01/2023   IR US  GUIDE VASC ACCESS RIGHT  11/12/2023   laser surgery for diabetic retinopathy Bilateral    LEFT HEART CATHETERIZATION WITH CORONARY ANGIOGRAM N/A 01/09/2013   Procedure: LEFT HEART CATHETERIZATION WITH CORONARY ANGIOGRAM;  Surgeon: Toribio JONELLE Fuel, MD;  Location: [facility] CATH LAB;  Service: Cardiovascular;  Laterality: N/A;   ORIF ANKLE FRACTURE Right 01/12/2013   Procedure: OPEN REDUCTION INTERNAL FIXATION (ORIF) ANKLE FRACTURE;  Surgeon: Jerona GAILS Harden, MD;  Location: [facility] OR;  Service: Orthopedics;  Laterality: Right;   PARTIAL COLECTOMY N/A 11/02/2023   Procedure: OPEN PARTIAL COLECTOMY WITH END COLOSTOMY;  Surgeon: Dasie Leonor LITTIE, MD;  Location: [facility] OR;  Service: General;  Laterality: N/A;   PARTIAL HYSTERECTOMY     TUNNELLED CATHETER EXCHANGE N/A 04/15/2024   Procedure: TUNNELLED CATHETER EXCHANGE;  Surgeon: Pearline Norman RAMAN, MD;  Location: [facility] PV LAB;  Service: Cardiovascular;  Laterality: N/A;    Allergies: Nsaids, Lisinopril, and Peanut-containing drug products  Medications: Prior to Admission medications   Medication Sig Start Date End Date Taking? Authorizing Provider  albuterol  (PROVENTIL ) (2.5 MG/3ML) 0.083% nebulizer solution Take 3 mLs (2.5 mg total) by nebulization every 2 (two) hours as  needed for wheezing. 11/25/23  Yes Ghimire, Donalda HERO, MD  apixaban  (ELIQUIS ) 2.5 MG TABS tablet Take 1 tablet (2.5 mg total) by mouth 2 (two) times daily. 10/04/22  Yes Acharya, Gayatri A, MD  brimonidine  (ALPHAGAN ) 0.2 % ophthalmic solution Place 1 drop into both eyes 2 (two) times daily.   Yes [provider]  calcitRIOL  (ROCALTROL ) 0.25 MCG capsule TAKE 1 CAPSULE (0.25 MCG TOTAL) EVERY MONDAY, WEDNESDAY, AND FRIDAY Patient taking differently: Take 0.25 mcg by mouth in the morning and at bedtime. 06/22/22  Yes Christia Budds, MD  carvedilol  (COREG ) 12.5 MG  tablet TAKE 1 TABLET (12.5 MG TOTAL) BY MOUTH 2 (TWO) TIMES DAILY WITH A MEAL. 12/22/21  Yes Autry-Lott, Rojean, DO  insulin  glargine (LANTUS ) 100 UNIT/ML injection Inject 0.07 mLs (7 Units total) into the skin daily. Please schedule appointment before next refill. Patient taking differently: Inject 10 Units into the skin daily. Please schedule appointment before next refill. 02/01/24  Yes [name], MD  latanoprost  (XALATAN ) 0.005 % ophthalmic solution Place 1 drop into both eyes at bedtime.   Yes [provider]  melatonin 5 MG TABS Take 10 mg by mouth at bedtime as needed (for insomnia).   Yes [provider]  midodrine  (PROAMATINE ) 2.5 MG tablet Take 2.5 mg by mouth.   Yes [provider]  Multiple Vitamin (MULTIVITAMIN WITH MINERALS) TABS Take 1 tablet by mouth daily.   Yes [provider]  NOVOLIN R 100 UNIT/ML injection Inject 0-9 Units into the skin in the morning, at noon, and at bedtime. Per sliding scale 04/03/24  Yes [provider]  ACCU-CHEK AVIVA PLUS test strip CHECK BLOOD GLUCOSE THREE TIMES DAILY 11/05/22   Joshua Domino, DO  amLODipine  (NORVASC ) 5 MG tablet Take 0.5 tablets (2.5 mg total) by mouth at bedtime. Patient not taking: Reported on 03/26/2024 02/01/24 03/26/24  [name], MD  Blood Glucose Monitoring Suppl (ACCU-CHEK AVIVA PLUS) w/Device KIT CHECK BLOOD GLUCOSE THREE TIMES DAILY 10/26/20   Autry-Lott, Rojean, DO  collagenase  (SANTYL ) 250 UNIT/GM ointment Apply 1 Application topically daily. Apply nickel thick amount to ulcer Right lateral ankle 3 x 2 cm with muscle/fascia capsule exposed Patient not taking: Reported on 04/19/2024 04/15/24 05/15/24  Lamount Ethan CROME, DPM  DROPLET INSULIN  SYRINGE 31G X 5/16 0.5 ML MISC USE FOUR TIMES DAILY FOR INJECTIONS 09/03/22   Joshua Domino, DO  furosemide  (LASIX ) 40 MG tablet Take 40 mg by mouth 2 (two) times daily. Patient not taking: Reported on 04/19/2024    [provider]   insulin  aspart (NOVOLOG  FLEXPEN) 100 UNIT/ML FlexPen 0-9 Units, Subcutaneous, 3 times daily with meals CBG < 70: Implement Hypoglycemia measures CBG 70 - 120: 0 units CBG 121 - 150: 1 unit CBG 151 - 200: 2 units CBG 201 - 250: 3 units CBG 251 - 300: 5 units CBG 301 - 350: 7 units CBG 351 - 400: 9 units CBG > 400: call MD Patient not taking: Reported on 04/19/2024 11/25/23   Raenelle Donalda HERO, MD  Insulin  Syringes, Disposable, U-100 0.5 ML MISC 4x daily injections 03/21/18   Riccio, Angela C, DO  Lancet Devices (ACCU-CHEK SOFTCLIX) lancets Fill for 1 month for TID testing. Use as instructed 11/13/13   Curtis Hadassah DASEN, MD  NEEDLE, DISP, 30 G (BD DISP NEEDLES) 30G X 1/2 MISC For 4x daily injections 11/05/15   Phelps, Jazma Y, DO  REPATHA SURECLICK 140 MG/ML SOAJ Inject 140 mg into the skin every 14 (fourteen) days.  Patient not taking: Reported on 03/26/2024 09/16/23   [provider]  VITAMIN D , CHOLECALCIFEROL, PO Take 1 tablet by mouth daily. Patient not taking: Reported on 04/19/2024    [provider]     Family History  Problem Relation Age of Onset   Diabetes Mother        No history CAD   Colon cancer Mother 43       died at 13 from CRC   Hypertension Father        Also had CAD   Heart disease Father    Cervical cancer Sister    Diabetes Sister    Congestive Heart Failure Sister    Heart disease Maternal Grandmother    Diabetes Brother    Kidney disease Brother    Sickle cell anemia Son    Thyroid  disease Son    Breast cancer Neg Hx    BRCA 1/2 Neg Hx     Social History   Socioeconomic History   Marital status: Legally Separated    Spouse name: Not on file   Number of children: 4   Years of education: Not on file   Highest education level: Not on file  Occupational History   Occupation: Disabled   Occupation: disabled  Tobacco Use   Smoking status: Never   Smokeless tobacco: Never  Vaping Use   Vaping status: Never Used  Substance and Sexual  Activity   Alcohol  use: No   Drug use: No   Sexual activity: Not Currently  Other Topics Concern   Not on file  Social History Narrative   Patient lives with her son..   Social Drivers of Health   Financial Resource Strain: Low Risk  (08/25/2021)   Received from [facility]   Overall Financial Resource Strain (CARDIA)    Difficulty of Paying Living Expenses: Not hard at all  Food Insecurity: No Food Insecurity (04/19/2024)   Hunger Vital Sign    Worried About Running Out of Food in the Last Year: Never true    Ran Out of Food in the Last Year: Never true  Transportation Needs: No Transportation Needs (04/19/2024)   PRAPARE - Administrator, Civil Service (Medical): No    Lack of Transportation (Non-Medical): No  Physical Activity: Insufficiently Active (06/20/2021)   Received from [facility]   Exercise Vital Sign    On average, how many days per week do you engage in moderate to strenuous exercise (like a brisk walk)?: 1 day    On average, how many minutes do you engage in exercise at this level?: 20 min  Stress: No Stress Concern Present (08/25/2021)   Received from [facility] - Occupational Stress Questionnaire    Feeling of Stress : Not at all  Social Connections: Socially Isolated (04/19/2024)   Social Connection and Isolation Panel    Frequency of Communication with Friends and Family: More than three times a week    Frequency of Social Gatherings with Friends and Family: Never    Attends Religious Services: Patient unable to answer    Active Member of Clubs or Organizations: No    Attends Banker Meetings: Never    Marital Status: Separated    Review of Systems: A 12 point ROS discussed and pertinent positives are indicated in the HPI above.  All other systems are negative.  Review of Systems  Constitutional:  Negative for fatigue and fever.  HENT:  Negative  for congestion.   Respiratory:   Negative for cough and shortness of breath.   Gastrointestinal:  Positive for nausea. Negative for abdominal pain, diarrhea and vomiting.    Vital Signs: BP 114/67   Pulse 69   Temp 97.8 F (36.6 C) (Oral)   Resp 12   Wt 167 lb 8.8 oz (76 kg)   SpO2 94%   BMI 26.24 kg/m   Advance Care Plan: The advanced care plan/surrogate decision maker was discussed at the time of visit and the patient did not wish to discuss or was not able to name a surrogate decision maker or provide an advance care plan.    Physical Exam Vitals and nursing note reviewed.  Constitutional:      Appearance: She is well-developed.  HENT:     Head: Normocephalic and atraumatic.     Mouth/Throat:     Mouth: Mucous membranes are moist.  Eyes:     Conjunctiva/sclera: Conjunctivae normal.  Cardiovascular:     Rate and Rhythm: Normal rate and regular rhythm.  Pulmonary:     Effort: Pulmonary effort is normal.  Musculoskeletal:     Cervical back: Normal range of motion.  Skin:    General: Skin is warm and dry.  Neurological:     General: No focal deficit present.     Mental Status: She is alert and oriented to person, place, and time. Mental status is at baseline.  Psychiatric:        Mood and Affect: Mood normal.        Behavior: Behavior normal.        Thought Content: Thought content normal.        Judgment: Judgment normal.     Imaging: IR Removal Tun Cv Cath W/O FL Result Date: 04/21/2024 INDICATION: 68 year old female with ESRD on HD, with request for line holiday. EXAM: REMOVAL TUNNELED CENTRAL VENOUS CATHETER MEDICATIONS: 2 mL of 1% lidocaine  ANESTHESIA/SEDATION: None FLUOROSCOPY: None COMPLICATIONS: None immediate. PROCEDURE: Informed written consent was obtained from the patient after a thorough discussion of the procedural risks, benefits and alternatives. All questions were addressed. Maximal Sterile Barrier Technique was utilized including mask, sterile gloves, sterile drape, hand hygiene and  skin antiseptic. A timeout was performed prior to the initiation of the procedure. The patient's right chest and catheter was prepped and draped in a normal sterile fashion. Heparin  was removed from both ports of catheter. 1% lidocaine  was used for local anesthesia. Using gentle blunt dissection the cuff of the catheter was exposed and the catheter was removed in it's entirety. Pressure was held till hemostasis was obtained. A sterile dressing was applied. The patient tolerated the procedure well with no immediate complications. IMPRESSION: Successful catheter removal as described above. Procedure performed by Carlin Griffon, PA-C Electronically Signed   By: CHRISTELLA.  Shick M.D.   On: 04/21/2024 15:57   DG Shoulder Right Port Result Date: 04/21/2024 CLINICAL DATA:  Right shoulder pain after fall several weeks ago. EXAM: RIGHT SHOULDER - 1 VIEW COMPARISON:  None Available. FINDINGS: There is no evidence of fracture or dislocation. There is no evidence of arthropathy or other focal bone abnormality. Soft tissues are unremarkable. IMPRESSION: Negative. Electronically Signed   By: Lynwood Landy Raddle M.D.   On: 04/21/2024 10:31   MR LUMBAR SPINE WO CONTRAST Result Date: 04/21/2024 CLINICAL DATA:  bacteremia mrsa. back tenderness EXAM: MRI LUMBAR SPINE WITHOUT CONTRAST TECHNIQUE: Multiplanar, multisequence MR imaging of the lumbar spine was performed. No intravenous contrast was administered. COMPARISON:  None Available. FINDINGS: Segmentation:  Standard. Alignment:  Physiologic. Vertebrae: Vertebral body heights are maintained. No visible marrow edema to suggest acute fracture or osteomyelitis. No disc edema to suggest discitis. No suspicious bone lesions. The absence of contrast precludes evaluation for abnormal enhancement. No visible epidural abscess. Conus medullaris and cauda equina: Conus extends to the L1-L2 level. Conus and cauda equina appear normal. Paraspinal and other soft tissues: Small kidneys with small cysts  bilaterally. Disc levels: Mild facet arthropathy and slight disc bulging in the lower lumbar spine without significant canal or foraminal stenosis. IMPRESSION: 1. No specific evidence of discitis/osteomyelitis. No visible epidural abscess on this noncontrast study. 2. No significant stenosis. Electronically Signed   By: Gilmore GORMAN Molt M.D.   On: 04/21/2024 01:46   ECHOCARDIOGRAM COMPLETE BUBBLE STUDY Result Date: 04/20/2024    ECHOCARDIOGRAM REPORT   Patient Name:   [name] [facility] Date of Exam: 04/20/2024 Medical Rec #:  [id number]         Height:       67.0 in Accession #:    [id number]        Weight:       160.9 lb Date of Birth:  12/12/1955         BSA:          1.844 m Patient Age:    68 years          BP:           115/53 mmHg Patient Gender: F                 HR:           72 bpm. Exam Location:  Inpatient Procedure: 2D Echo, Cardiac Doppler, Color Doppler, Saline Contrast Bubble Study            and Intracardiac Opacification Agent (Both Spectral and Color Flow            Doppler were utilized during procedure). Indications:    Sepsis  History:        Patient has prior history of Echocardiogram examinations.  Sonographer:    Vella Key Referring Phys: [phone] MATTHEW R HUNSUCKER  Sonographer Comments: Technically difficult study due to poor echo windows. IMPRESSIONS  1. Left ventricular ejection fraction, by estimation, is 50 to 55%. The left ventricle has low normal function. Left ventricular endocardial border not optimally defined to evaluate regional wall motion. There is mild left ventricular hypertrophy. Left ventricular diastolic parameters are consistent with Grade I diastolic dysfunction (impaired relaxation).  2. Right ventricular systolic function is mildly reduced. The right ventricular size is mildly enlarged. There is moderately elevated pulmonary artery systolic pressure. The estimated right ventricular systolic pressure is 51.0 mmHg.  3. The mitral valve is degenerative. Trivial mitral  valve regurgitation. No evidence of mitral stenosis.  4. The aortic valve is tricuspid. There is mild calcification of the aortic valve. Aortic valve regurgitation is trivial. No aortic stenosis is present.  5. The inferior vena cava is dilated in size with <50% respiratory variability, suggesting right atrial pressure of 15 mmHg.  6. Nondiagnostic agitated saline injection due to image quality. FINDINGS  Left Ventricle: Left ventricular ejection fraction, by estimation, is 50 to 55%. The left ventricle has low normal function. Left ventricular endocardial border not optimally defined to evaluate regional wall motion. The left ventricular internal cavity  size was normal in size. There is mild left ventricular hypertrophy. Left ventricular diastolic parameters are consistent with Grade I diastolic  dysfunction (impaired relaxation). Right Ventricle: The right ventricular size is mildly enlarged. Right vetricular wall thickness was not well visualized. Right ventricular systolic function is mildly reduced. There is moderately elevated pulmonary artery systolic pressure. The tricuspid  regurgitant velocity is 3.00 m/s, and with an assumed right atrial pressure of 15 mmHg, the estimated right ventricular systolic pressure is 51.0 mmHg. Left Atrium: Left atrial size was normal in size. Right Atrium: Right atrial size was normal in size. Pericardium: There is no evidence of pericardial effusion. Mitral Valve: The mitral valve is degenerative in appearance. Mild mitral annular calcification. Trivial mitral valve regurgitation. No evidence of mitral valve stenosis. Tricuspid Valve: The tricuspid valve is normal in structure. Tricuspid valve regurgitation is mild . No evidence of tricuspid stenosis. Aortic Valve: The aortic valve is tricuspid. There is mild calcification of the aortic valve. Aortic valve regurgitation is trivial. No aortic stenosis is present. Pulmonic Valve: The pulmonic valve was not well visualized.  Pulmonic valve regurgitation is trivial. No evidence of pulmonic stenosis. Aorta: The aortic root is normal in size and structure. Venous: The inferior vena cava is dilated in size with less than 50% respiratory variability, suggesting right atrial pressure of 15 mmHg. IAS/Shunts: The interatrial septum was not well visualized. Agitated saline contrast was given intravenously to evaluate for intracardiac shunting. Nondiagnostic agitated saline injection due to image quality.  LEFT VENTRICLE PLAX 2D LVIDd:         3.20 cm     Diastology LVIDs:         2.90 cm     LV e' medial:    3.81 cm/s LV PW:         1.00 cm     LV E/e' medial:  19.0 LV IVS:        1.00 cm     LV e' lateral:   6.20 cm/s LVOT diam:     1.50 cm     LV E/e' lateral: 11.7 LV SV:         37 LV SV Index:   20 LVOT Area:     1.77 cm  LV Volumes (MOD) LV vol d, MOD A2C: 83.0 ml LV vol d, MOD A4C: 77.7 ml LV vol s, MOD A2C: 37.2 ml LV vol s, MOD A4C: 33.4 ml LV SV MOD A2C:     45.8 ml LV SV MOD A4C:     77.7 ml LV SV MOD BP:      46.3 ml RIGHT VENTRICLE RV S prime:     6.53 cm/s TAPSE (M-mode): 1.6 cm LEFT ATRIUM             Index        RIGHT ATRIUM           Index LA diam:        4.20 cm 2.28 cm/m   RA Area:     13.70 cm LA Vol (A2C):   46.6 ml 25.27 ml/m  RA Volume:   35.00 ml  18.98 ml/m LA Vol (A4C):   37.5 ml 20.34 ml/m LA Biplane Vol: 42.1 ml 22.83 ml/m  AORTIC VALVE LVOT Vmax:   108.00 cm/s LVOT Vmean:  74.100 cm/s LVOT VTI:    0.208 m  AORTA Ao Root diam: 3.00 cm MITRAL VALVE               TRICUSPID VALVE MV Area (PHT): 3.81 cm    TV Peak grad:   36.0 mmHg MV Decel Time: 199 msec  TV Vmax:        3.00 m/s MV E velocity: 72.40 cm/s  TR Peak grad:   36.0 mmHg MV A velocity: 93.00 cm/s  TR Vmax:        300.00 cm/s MV E/A ratio:  0.78                            SHUNTS                            Systemic VTI:  0.21 m                            Systemic Diam: 1.50 cm Soyla Merck MD Electronically signed by Soyla Merck MD Signature  Date/Time: 04/20/2024/10:54:01 PM    Final    VAS US  ABI WITH/WO TBI Result Date: 04/20/2024  LOWER EXTREMITY DOPPLER STUDY Patient Name:  [name]  Date of Exam:   04/20/2024 Medical Rec #: [id number]          Accession #:    [id number] Date of Birth: May 14, 1956          Patient Gender: F Patient Age:   1 years Exam Location:  [facility] ([place]) Procedure:      VAS US  ABI WITH/WO TBI Referring Phys: MARCUS DUDA --------------------------------------------------------------------------------  Indications: Peripheral artery disease.  Performing Technologist: Jimmye Scarce RVT  Examination Guidelines: A complete evaluation includes at minimum, Doppler waveform signals and systolic blood pressure reading at the level of bilateral brachial, anterior tibial, and posterior tibial arteries, when vessel segments are accessible. Bilateral testing is considered an integral part of a complete examination. Photoelectric Plethysmograph (PPG) waveforms and toe systolic pressure readings are included as required and additional duplex testing as needed. Limited examinations for reoccurring indications may be performed as noted.  ABI Findings: +---------+------------------+-----+----------+--------------------------------+ Right    Rt Pressure (mmHg)IndexWaveform  Comment                          +---------+------------------+-----+----------+--------------------------------+ Brachial 107                              unable to enter manually on the                                            report due to machine                                                      malfunction                      +---------+------------------+-----+----------+--------------------------------+ PTA                             monophasic                                 +---------+------------------+-----+----------+--------------------------------+ DP  186               1.74 monophasic                                  +---------+------------------+-----+----------+--------------------------------+ Great Toe                       Abnormal  unable to obtain pressure due to                                           essentially flat PPG digit                                                 waveform                         +---------+------------------+-----+----------+--------------------------------+ +---------+------------------+-----+---------+--------------------------+ Left     Lt Pressure (mmHg)IndexWaveform Comment                    +---------+------------------+-----+---------+--------------------------+ Brachial 104                    triphasic                           +---------+------------------+-----+---------+--------------------------+ PTA                             biphasic                            +---------+------------------+-----+---------+--------------------------+ DP       198               1.85 biphasic                            +---------+------------------+-----+---------+--------------------------+ Great Toe109               1.02 Abnormal possibly falsely elevated. +---------+------------------+-----+---------+--------------------------+ +-------+-----------+-----------+------------+------------+ ABI/TBIToday's ABIToday's TBIPrevious ABIPrevious TBI +-------+-----------+-----------+------------+------------+ Right  N/C                                            +-------+-----------+-----------+------------+------------+ Left   N/C/1.05                                       +-------+-----------+-----------+------------+------------+   Summary: Right: Resting right ankle-brachial index indicates noncompressible right lower extremity arteries. Left: Resting left ankle-brachial index indicates noncompressible left lower extremity arteries. Falsely elevated ABI and TBI  *See table(s) above for measurements and observations.   Electronically signed by Fonda Rim on 04/20/2024 at 6:09:30 PM.    Final    CT ANKLE RIGHT W CONTRAST Result Date: 04/19/2024 CLINICAL DATA:  Necrotizing fasciitis EXAM: CT OF THE RIGHT ANKLE WITH CONTRAST TECHNIQUE: Multidetector CT imaging of the right ankle was performed following  the standard protocol during bolus administration of intravenous contrast. RADIATION DOSE REDUCTION: This exam was performed according to the departmental dose-optimization program which includes automated exposure control, adjustment of the mA and/or kV according to patient size and/or use of iterative reconstruction technique. CONTRAST:  75mL OMNIPAQUE  IOHEXOL  350 MG/ML SOLN COMPARISON:  04/19/2024 radiographs FINDINGS: Bones/Joint/Cartilage Lateral plate and screw fixator in the distal fibula with a long transverse screw extending into the tibia. Collapse and erosion of the talus and tibial plafond and with gas along the joint space and resulting cavities in this region. Substantial erosions or cystic lesions along the distal fibular tip, the distal tibial margin, and the remaining flattened talus. Gas in the talonavicular articulation, sinus tarsi, and subtalar joints. Gas in the calcaneocuboid joint, midfoot joints, and Lisfranc joint. Hemangioma or lipoma in the posterior calcaneus. Ligaments Suboptimally assessed by CT. Muscles and Tendons Gas densities track within along the distal tibialis posterior muscle and tendon at the level of the distal diaphysis and distally, along with a small amount of gas density posterior to the tibiofibular syndesmosis. Gas tracks in the common peroneus tendon sheath and along the peroneus longus. Thickened medial band of the plantar fascia. Muscular atrophy in the distal calf. Soft tissues Severe ulceration overlying the distal fibula with possible exposure of the periosteum and fibular plate/screws for example on image 117 series 5. Subcutaneous edema tracks along the plantar hindfoot and  dorsal forefoot. IMPRESSION: 1. Severe ulceration overlying the distal fibula with possible exposure of the periosteum and fibular plate/screws. 2. Collapse and erosion of the talus and tibial plafond with gas along the joint space and resulting cavities in this region compatible with osteomyelitis. Substantial erosions or cystic lesions along the distal fibular tip, the distal tibial margin, and the remaining flattened talus. Gas in the resulting tibiotalar cavity, talonavicular articulation, sinus tarsi, subtalar joints, calcaneocuboid joint, midfoot joints, and Lisfranc joint. Gas also tracks along various tendon groups and adjacent to the tibiofibular syndesmosis. 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 3. Subcutaneous edema tracks along the plantar hindfoot and dorsal forefoot. 4. Thickened medial band of the plantar fascia. 5. Muscular atrophy in the distal calf. Electronically Signed   By: Ryan Salvage M.D.   On: 04/19/2024 19:14   DG Chest Portable 1 View Result Date: 04/19/2024 CLINICAL DATA:  Sepsis.  Central line placement. EXAM: PORTABLE CHEST 1 VIEW COMPARISON:  04/19/2024 FINDINGS: New left jugular central venous catheter is seen with tip overlying the SVC. No pneumothorax visualized. Right jugular dual-lumen central venous dialysis catheter remains in appropriate position. Heart size is normal.  Both lungs are clear. IMPRESSION: New left jugular central venous catheter in appropriate position. No pneumothorax visualized. No active lung disease. Electronically Signed   By: Norleen DELENA Kil M.D.   On: 04/19/2024 18:31   DG Ankle Complete Right Result Date: 04/19/2024 CLINICAL DATA:  Evaluate for osteomyelitis. EXAM: RIGHT ANKLE - COMPLETE 3+ VIEW COMPARISON:  Right ankle radiograph dated 03/05/2024 FINDINGS: Distal tibia and fibular fixation. Fragmentation of the tip of the distal fibula new since the prior radiograph  and suspicious for osteomyelitis. There is lucency adjacent to the lowermost fibular screw likely representing loosening. Degenerative changes and chronic deformity and fragmentation of the ankle. There is soft tissue air medial to the ankle as well as large amount of soft tissue gas in the distal calf posteriorly. In the absence of recent procedure findings concerning for an infectious process and possibly necrotizing fasciitis.  Clinical correlation recommended. Dressing noted over the lateral ankle. IMPRESSION: 1. Fragmentation of the tip of the distal fibula new since the prior radiograph and suspicious for osteomyelitis. 2. Soft tissue air medial to the ankle as well as large amount of soft tissue gas in the distal calf posteriorly. In the absence of recent procedure findings concerning for an infectious process and possibly necrotizing fasciitis. Electronically Signed   By: Vanetta Chou M.D.   On: 04/19/2024 15:11   DG Chest Port 1 View Result Date: 04/19/2024 CLINICAL DATA:  Questionable sepsis. EXAM: PORTABLE CHEST 1 VIEW COMPARISON:  Chest CT dated 11/03/2023 FINDINGS: Dialysis catheter with tip at the cavoatrial junction. No focal consolidation, pleural effusion, or pneumothorax. Top-normal cardiac size. No acute osseous pathology. IMPRESSION: No active disease. Electronically Signed   By: Vanetta Chou M.D.   On: 04/19/2024 14:16   PERIPHERAL VASCULAR CATHETERIZATION Result Date: 04/15/2024 Images from the original result were not included. Patient name: [name] MRN: [id number] DOB: 06/19/1956 Sex: female 04/15/2024 Pre-operative Diagnosis: ESRD on HD, nonfunctioning TDC Post-operative diagnosis:  Same Surgeon:  Norman GORMAN Serve, MD Procedure Performed: Tunneled dialysis catheter exchange under fluoroscopic guidance Indications: Ms. Straley is a 68 year old female with ESRD on HD who is presenting to the HD access center with a nonfunctioning catheter.  She has not been seen or  evaluated for upper extremity permanent access yet but has an appointment with our office in about 2 weeks.  Risks benefits of tunneled dialysis catheter exchange were reviewed and she elected to proceed. Findings: Right IJ TDC exchange with tip placed at atriocaval junction.  Procedure:  The patient was identified in the holding area and taken to the cath lab  The patient was then placed supine on the table and prepped and draped in the usual sterile fashion.  A time out was called.  The catheter was visualized under fluoroscopy and a glide advantage wire was placed through one of the ports and into the IVC. Using lidocaine  and blunt dissection the cuff was then freed from the surrounding tissue attachments. The catheter was then removed over the glide advantage wire leaving the wire in place in the IVC. The new 19 cm catheter was then tracked over this Glide vantage wire with its stylette in place. This was placed into the atriocaval junction. The stylette and wire were removed. Both ports aspirated and flushed with ease. They were heparin  locked and the catheter was fastened to the skin with a nylon suture. Norman GORMAN Serve MD [facility] Office: [phone]   MM 3D SCREENING MAMMOGRAM BILATERAL BREAST Result Date: 04/03/2024 CLINICAL DATA:  Screening. EXAM: DIGITAL SCREENING BILATERAL MAMMOGRAM WITH TOMOSYNTHESIS AND CAD TECHNIQUE: Bilateral screening digital craniocaudal and mediolateral oblique mammograms were obtained. Bilateral screening digital breast tomosynthesis was performed. The images were evaluated with computer-aided detection. COMPARISON:  Previous exam(s). ACR Breast Density Category c: The breasts are heterogeneously dense, which may obscure small masses. FINDINGS: There are no findings suspicious for malignancy. IMPRESSION: No mammographic evidence of malignancy. A result letter of this screening mammogram will be mailed directly to the patient. RECOMMENDATION:  Screening mammogram in one year. (Code:[id number]-B-01Y) BI-RADS CATEGORY  1: Negative. Electronically Signed   By: Rosina Gelineau M.D.   On: 04/03/2024 11:01    Labs:  CBC: Recent Labs    04/19/24 1341 04/20/24 0340 04/21/24 0520 04/22/24 0500  WBC 28.5* 30.2* 19.1* 17.8*  HGB 9.4* 9.1* 7.8* 7.7*  HCT 29.2* 27.5* 23.7* 23.7*  PLT 225 244 156 194    COAGS: Recent Labs    10/28/23 0522 10/29/23 1806 10/30/23 0501 10/30/23 1427 10/30/23 1836 10/31/23 0503 04/19/24 1341  INR >10.0*  --   --   --   --   --  1.1  APTT  --    < > 39* >200* 54* 76*  --    < > = values in this interval not displayed.    BMP: Recent Labs    04/19/24 2016 04/20/24 0340 04/21/24 0520 04/22/24 0500  NA 130* 128* 128* 126*  K 3.8 3.3* 3.4* 3.4*  CL 94* 92* 95* 94*  CO2 17* 21* 22 21*  GLUCOSE 436* 374* 124* 142*  BUN 51* 55* 58* 74*  CALCIUM  7.9* 8.1* 8.0* 7.6*  CREATININE 6.96* 6.89* 6.86* 7.33*  GFRNONAA 6* 6* 6* 6*    LIVER FUNCTION TESTS: Recent Labs    11/07/23 1431 11/11/23 0327 11/15/23 0806 11/22/23 0835 03/03/24 0151 04/19/24 1341 04/22/24 0500  BILITOT 1.6* 0.5  --   --  0.6 2.0*  --   AST 16 23  --   --  14* 13*  --   ALT 14 17  --   --  7 12  --   ALKPHOS 47 39  --   --  42 106  --   PROT 6.3* 5.8*  --   --  6.1* 5.4*  --   ALBUMIN  2.1* 1.7*   < > 2.1* 2.3* 1.5* <1.5*   < > = values in this interval not displayed.    TUMOR MARKERS: No results for input(s): AFPTM, CEA, CA199, CHROMGRNA in the last 8760 hours.  Assessment and Plan:  68 y.o. female inpatient. History of DM,  ESRD on HD via a RIJ TDC. Presented to the ED at [facility] on 7.27.25. Found ot be septic with necrotizing fascitis of the right lower extremity. Blood cultures from showed staph and strep on 7.28.25. Team requested a line holiday and IR removed the RIJ line on 7.29.25.   PLAN: IR Image Guided Tunneled HD Catheter Placement  Risks and benefits discussed with the patient including, but not  limited to bleeding, infection, vascular injury, pneumothorax which may require chest tube placement, air embolism or even death  All of the patient's questions were answered, patient is agreeable to proceed. Consent signed and in chart.   Thank you for this interesting consult.  I greatly enjoyed meeting [name] and look forward to participating in their care.  A copy of this report was sent to the requesting provider on this date.  Electronically Signed: Delon JAYSON Beagle, NP 04/22/2024, 11:03 AM   I spent a total of 40 Minutes    in face to face in clinical consultation, greater than 50% of which was counseling/coordinating care for tunneled HD catheter placement

## 2024-04-22 NOTE — Progress Notes (Signed)
 Nephrology Follow-Up Consult note  Outpatient dialysis unit: [facility] prescription: F180, 350 BFR, EDW 69.5 (weight was 67.4 post HD on 7/25), 4hrs, 3K, 2.5 Ca, RIJ TDC, mircera 100mcg given 7/25, was on IV iron , calcitriol  0.25mcg on home meds list (unclear if taking, no binders   Assessment/Recommendations: [name] is a/an 68 y.o. female with a past medical history notable for ESRD on HD admitted with sepsis c/b shock   # ESRD: Dialysis on MWF schedule outpatient.  Held dialysis Monday because of hypotension and unable to receive on Tuesday because of catheter issue.  Plan for dialysis tomorrow after catheter replaced.   # Volume/ hypertension: Continue with ultrafiltration as tolerated on dialysis.  At this time limited by hypotension.  Will need lower dry weight at discharge   # Anemia of Chronic Kidney Disease: Hemoglobin 7.8.  Received Mircera on 7/25.  Hold IV iron  for now.  Transfusion if needed   # Secondary Hyperparathyroidism/Hyperphosphatemia: Corrected calcium  normal.  Albumin  very low associated with infection.  Not on any binders but phosphorus at goal   # Vascular access: TDC nonfunctional on 7/29.  Line holiday for now.  Plan for IR replacement on 7/31.  Appreciate help   # Septic shock: Antibiotics and pressors per primary team.  Pressor requirement overall decreased but fluctuating.  Currently on norepinephrine  and vasopressin    # Osteomyelitis/necrotizing fasciitis: Status post surgery with amputation on 7/28.     # Additional recommendations: - Dose all meds for creatinine clearance < 10 ml/min  - Unless absolutely necessary, no MRIs with gadolinium.  - Implement save arm precautions.  Prefer needle sticks in the dorsum of the hands or wrists.  No blood pressure measurements in arm. - If blood transfusion is requested during hemodialysis sessions, please alert us  prior to the session.  - Use synthetic opioids (Fentanyl /Dilaudid ) if needed    Recommendations were discussed with the primary team.   Recommendations conveyed to primary service.    [name] [facility] 04/22/2024 11:20 AM  ___________________________________________________________  CC: Weakness  Interval History/Subjective: Attempted dialysis yesterday but unsuccessful with catheter.  Were planning on dialysis catheter holiday anyway so catheter was removed.  Plan to replace tomorrow.  Patient states he feels okay.  Denies significant shortness of breath.   Medications:  Current Facility-Administered Medications  Medication Dose Route Frequency Provider Last Rate Last Admin   acetaminophen  (TYLENOL ) tablet 650 mg  650 mg Oral Q6H PRN Amoako, Prince, MD   650 mg at 04/21/24 1341   ascorbic acid  (VITAMIN C ) tablet 1,000 mg  1,000 mg Oral Daily Duda, Marcus V, MD   1,000 mg at 04/21/24 9196   brimonidine  (ALPHAGAN ) 0.2 % ophthalmic solution 1 drop  1 drop Both Eyes BID Paliwal, Aditya, MD   1 drop at 04/21/24 2145   [START ON 04/23/2024] ceFAZolin  (ANCEF ) IVPB 2g/100 mL premix  2 g Intravenous Once Omohundro, Jennifer C, NP       Chlorhexidine  Gluconate Cloth 2 % PADS 6 each  6 each Topical Daily Jude Harden GAILS, MD   6 each at 04/21/24 0804   Chlorhexidine  Gluconate Cloth 2 % PADS 6 each  6 each Topical Q0600 Harden Jerona GAILS, MD   6 each at 04/22/24 0400   docusate sodium  (COLACE) capsule 100 mg  100 mg Oral BID PRN Harden Jerona GAILS, MD       fentaNYL  (SUBLIMAZE ) injection 12.5 mcg  12.5 mcg Intravenous Q4H PRN Renne Homans, MD  heparin  injection 1,000 Units  1,000 Units Dialysis PRN Macel Jayson PARAS, MD       insulin  aspart (novoLOG ) injection 0-15 Units  0-15 Units Subcutaneous TID WC Merilee Linsey I, [facility]   2 Units at 04/22/24 9162   insulin  glargine-yfgn (SEMGLEE ) injection 20 Units  20 Units Subcutaneous QHS Harden Jerona GAILS, MD   20 Units at 04/21/24 2143   lidocaine  (PF) (XYLOCAINE ) 1 % injection 5 mL  5 mL Intradermal PRN  Macel Jayson PARAS, MD       lidocaine -prilocaine  (EMLA ) cream 1 Application  1 Application Topical PRN Macel Jayson PARAS, MD       midodrine  (PROAMATINE ) tablet 10 mg  10 mg Oral TID WC Duda, Marcus V, MD   10 mg at 04/22/24 9162   norepinephrine  (LEVOPHED ) 4mg  in (0.016 mg/mL) premix infusion  0-40 mcg/min Intravenous Continuous Hunsucker, Donnice SAUNDERS, MD 7.5 mL/hr at 04/22/24 0800 2 mcg/min at 04/22/24 0800   nutrition supplement (JUVEN) (JUVEN) powder packet 1 packet  1 packet Oral BID BM Duda, Marcus V, MD   1 packet at 04/21/24 1341   Oral care mouth rinse  15 mL Mouth Rinse PRN Harden Jerona GAILS, MD       oxyCODONE  (Oxy IR/ROXICODONE ) immediate release tablet 5 mg  5 mg Oral Q6H PRN Renne Homans, MD   5 mg at 04/22/24 9162   pentafluoroprop-tetrafluoroeth (GEBAUERS) aerosol 1 Application  1 Application Topical PRN Macel Jayson PARAS, MD       polyethylene glycol (MIRALAX  / GLYCOLAX ) packet 17 g  17 g Oral Daily PRN Harden Jerona GAILS, MD       vancomycin  variable dose per unstable renal function (pharmacist dosing)   Does not apply See admin instructions Merilee Linsey I, RPH       zinc  sulfate (50mg  elemental zinc ) capsule 220 mg  220 mg Oral Daily Harden Jerona GAILS, MD   220 mg at 04/21/24 9196      Review of Systems: 10 systems reviewed and negative except per interval history/subjective  Physical Exam: Vitals:   04/22/24 0801 04/22/24 1059  BP:    Pulse:    Resp:    Temp: 97.8 F (36.6 C) 97.6 F (36.4 C)  SpO2:     Total I/O In: 16.2 [I.V.:16.2] Out: -   Intake/Output Summary (Last 24 hours) at 04/22/2024 1120 Last data filed at 04/22/2024 0800 Gross per 24 hour  Intake 716.71 ml  Output 125 ml  Net 591.71 ml   Constitutional: well-appearing, no acute distress ENMT: ears and nose without scars or lesions, MMM CV: normal rate, trace edema in the left upper extremities as well as lower extremities, 1+ edema in the right upper extremity Respiratory: Bilateral chest  rise, normal work of breathing Gastrointestinal: soft, non-tender, no palpable masses or hernias Skin: no visible lesions or rashes Extremities: Right lower extremity absent below the knee, wound covered Psych: alert, judgement/insight appropriate, appropriate mood and affect   Test Results I personally reviewed new and old clinical labs and radiology tests Lab Results  Component Value Date   NA 126 (L) 04/22/2024   K 3.4 (L) 04/22/2024   CL 94 (L) 04/22/2024   CO2 21 (L) 04/22/2024   BUN 74 (H) 04/22/2024   CREATININE 7.33 (H) 04/22/2024   GFR 17.34 (L) 04/12/2021   CALCIUM  7.6 (L) 04/22/2024   ALBUMIN  <1.5 (L) 04/22/2024   PHOS 4.3 04/22/2024    CBC Recent Labs  Lab 04/19/24 1341 04/20/24 0340 04/21/24  0520 04/22/24 0500  WBC 28.5* 30.2* 19.1* 17.8*  NEUTROABS 27.4*  --   --   --   HGB 9.4* 9.1* 7.8* 7.7*  HCT 29.2* 27.5* 23.7* 23.7*  MCV 82.7 79.5* 78.7* 79.5*  PLT 225 244 156 194

## 2024-04-23 ENCOUNTER — Inpatient Hospital Stay (HOSPITAL_COMMUNITY)

## 2024-04-23 DIAGNOSIS — M869 Osteomyelitis, unspecified: Secondary | ICD-10-CM

## 2024-04-23 DIAGNOSIS — E160 Drug-induced hypoglycemia without coma: Secondary | ICD-10-CM

## 2024-04-23 DIAGNOSIS — A419 Sepsis, unspecified organism: Secondary | ICD-10-CM | POA: Diagnosis not present

## 2024-04-23 DIAGNOSIS — N186 End stage renal disease: Secondary | ICD-10-CM | POA: Diagnosis not present

## 2024-04-23 DIAGNOSIS — R6521 Severe sepsis with septic shock: Secondary | ICD-10-CM | POA: Diagnosis not present

## 2024-04-23 DIAGNOSIS — E119 Type 2 diabetes mellitus without complications: Secondary | ICD-10-CM | POA: Diagnosis not present

## 2024-04-23 HISTORY — PX: IR FLUORO GUIDE CV LINE LEFT: IMG2282

## 2024-04-23 HISTORY — PX: IR US GUIDE VASC ACCESS LEFT: IMG2389

## 2024-04-23 LAB — CULTURE, BLOOD (ROUTINE X 2)

## 2024-04-23 LAB — GLUCOSE, CAPILLARY
Glucose-Capillary: 83 mg/dL (ref 70–99)
Glucose-Capillary: <10 mg/dL — CL (ref 70–99)
Glucose-Capillary: 31 mg/dL — CL (ref 70–99)
Glucose-Capillary: 114 mg/dL — ABNORMAL HIGH (ref 70–99)
Glucose-Capillary: 144 mg/dL — ABNORMAL HIGH (ref 70–99)
Glucose-Capillary: 40 mg/dL — CL (ref 70–99)
Glucose-Capillary: 99 mg/dL (ref 70–99)
Glucose-Capillary: 93 mg/dL (ref 70–99)
Glucose-Capillary: 79 mg/dL (ref 70–99)
Glucose-Capillary: 146 mg/dL — ABNORMAL HIGH (ref 70–99)
Glucose-Capillary: 161 mg/dL — ABNORMAL HIGH (ref 70–99)
Glucose-Capillary: 151 mg/dL — ABNORMAL HIGH (ref 70–99)
Glucose-Capillary: 127 mg/dL — ABNORMAL HIGH (ref 70–99)

## 2024-04-23 LAB — CBC
HCT: 23.5 % — ABNORMAL LOW (ref 36.0–46.0)
Hemoglobin: 7.5 g/dL — ABNORMAL LOW (ref 12.0–15.0)
MCH: 25.6 pg — ABNORMAL LOW (ref 26.0–34.0)
MCHC: 31.9 g/dL (ref 30.0–36.0)
MCV: 80.2 fL (ref 80.0–100.0)
Platelets: 136 K/uL — ABNORMAL LOW (ref 150–400)
RBC: 2.93 MIL/uL — ABNORMAL LOW (ref 3.87–5.11)
RDW: 19.6 % — ABNORMAL HIGH (ref 11.5–15.5)
WBC: 16.4 K/uL — ABNORMAL HIGH (ref 4.0–10.5)
nRBC: 0.5 % — ABNORMAL HIGH (ref 0.0–0.2)

## 2024-04-23 LAB — BASIC METABOLIC PANEL WITH GFR
Anion gap: 10 (ref 5–15)
BUN: 85 mg/dL — ABNORMAL HIGH (ref 8–23)
CO2: 20 mmol/L — ABNORMAL LOW (ref 22–32)
Calcium: 7.2 mg/dL — ABNORMAL LOW (ref 8.9–10.3)
Chloride: 95 mmol/L — ABNORMAL LOW (ref 98–111)
Creatinine, Ser: 8.26 mg/dL — ABNORMAL HIGH (ref 0.44–1.00)
GFR, Estimated: 5 mL/min — ABNORMAL LOW (ref >60)
Glucose, Bld: 62 mg/dL — ABNORMAL LOW (ref 70–99)
Potassium: 3.2 mmol/L — ABNORMAL LOW (ref 3.5–5.1)
Sodium: 125 mmol/L — ABNORMAL LOW (ref 135–145)

## 2024-04-23 LAB — VANCOMYCIN, RANDOM: Vancomycin Rm: 20 ug/mL

## 2024-04-23 MED ORDER — LIDOCAINE-EPINEPHRINE 1 %-1:100000 IJ SOLN
INTRAMUSCULAR | Status: AC
  Filled 2024-04-23: qty 1

## 2024-04-23 MED ORDER — FENTANYL CITRATE (PF) 100 MCG/2ML IJ SOLN
INTRAMUSCULAR | Status: AC
Start: 2024-04-23 — End: 2024-04-23
  Filled 2024-04-23: qty 2

## 2024-04-23 MED ORDER — MIDAZOLAM HCL 2 MG/2ML IJ SOLN
INTRAMUSCULAR | Status: AC
  Filled 2024-04-23: qty 2

## 2024-04-23 MED ORDER — DEXTROSE 50 % IV SOLN
25.0000 g | INTRAVENOUS | Status: AC
  Administered 2024-04-23: 25 g via INTRAVENOUS

## 2024-04-23 MED ORDER — DEXTROSE 50 % IV SOLN
INTRAVENOUS | Status: AC
  Filled 2024-04-23: qty 50

## 2024-04-23 MED ORDER — CEFAZOLIN SODIUM-DEXTROSE 2-4 GM/100ML-% IV SOLN
INTRAVENOUS | Status: AC | PRN
  Administered 2024-04-23: 2 g via INTRAVENOUS

## 2024-04-23 MED ORDER — CEFAZOLIN SODIUM-DEXTROSE 2-4 GM/100ML-% IV SOLN
INTRAVENOUS | Status: AC
  Filled 2024-04-23: qty 100

## 2024-04-23 MED ORDER — INSULIN ASPART 100 UNIT/ML IJ SOLN: 0.0000 [IU] | INTRAMUSCULAR | Status: DC

## 2024-04-23 MED ORDER — INSULIN GLARGINE-YFGN 100 UNIT/ML ~~LOC~~ SOLN
10.0000 [IU] | Freq: Every day | SUBCUTANEOUS | Status: DC
  Filled 2024-04-23: qty 0.1

## 2024-04-23 MED ORDER — DEXTROSE 50 % IV SOLN
1.0000 | Freq: Once | INTRAVENOUS | Status: AC
  Administered 2024-04-23: 50 mL via INTRAVENOUS
  Filled 2024-04-23: qty 50

## 2024-04-23 MED ORDER — DEXTROSE 10 % IV SOLN: INTRAVENOUS | Status: DC

## 2024-04-23 MED ORDER — DEXTROSE 50 % IV SOLN: 25.0000 g | INTRAVENOUS | Status: AC

## 2024-04-23 MED ORDER — FENTANYL CITRATE (PF) 100 MCG/2ML IJ SOLN
INTRAMUSCULAR | Status: AC | PRN
  Administered 2024-04-23 (×2): 25 ug via INTRAVENOUS

## 2024-04-23 MED ORDER — LIDOCAINE-EPINEPHRINE 1 %-1:100000 IJ SOLN
20.0000 mL | Freq: Once | INTRAMUSCULAR | Status: AC
  Administered 2024-04-23: 16 mL via INTRADERMAL

## 2024-04-23 MED ORDER — HEPARIN SODIUM (PORCINE) 1000 UNIT/ML IJ SOLN
INTRAMUSCULAR | Status: AC
  Filled 2024-04-23: qty 10

## 2024-04-23 MED ORDER — DEXTROSE 50 % IV SOLN
INTRAVENOUS | Status: AC
  Administered 2024-04-23: 25 g via INTRAVENOUS
  Filled 2024-04-23: qty 50

## 2024-04-23 MED ORDER — DEXTROSE 5 % IV BOLUS: 1000.0000 mL | Freq: Once | INTRAVENOUS | Status: DC

## 2024-04-23 MED ORDER — DEXTROSE 5 % IV SOLN: INTRAVENOUS | Status: DC

## 2024-04-23 MED ORDER — VANCOMYCIN IV (FOR PTA / DISCHARGE USE ONLY): 750.0000 mg | INTRAVENOUS | Status: DC

## 2024-04-23 MED ORDER — HEPARIN SODIUM (PORCINE) 1000 UNIT/ML IJ SOLN
10000.0000 [IU] | Freq: Once | INTRAMUSCULAR | Status: AC
  Administered 2024-04-23: 3800 [IU]

## 2024-04-23 MED ORDER — MIDAZOLAM HCL 2 MG/2ML IJ SOLN
INTRAMUSCULAR | Status: AC | PRN
Start: 2024-04-23 — End: 2024-04-23
  Administered 2024-04-23: 1 mg via INTRAVENOUS

## 2024-04-23 NOTE — Progress Notes (Signed)
 Brief care note: Patient noted to have decreased responsiveness and therefore code stroke was activated.  After activation and prior to my arrival, it was discovered that the patient was severely hypoglycemic with an unreadable low on the bedside monitor.  She was receiving an amp of D50 as I arrived.  On my arrival, she was responsive only to noxious stimuli with eyes open, moaning and nonverbal.  Following glucose administration, she rapidly became verbal, telling her name, able to follow commands.  She cross midline to the right after I called her name, and was able to follow commands in all four extremities.  This appears to be a hypoglycemic event, and with the patient rapidly improving I do not feel we need to further pursue a code stroke at this time.  Please call if neurology can be of any further assistance.  Aisha Seals, MD [facility]   If 7pm- 7am, please page neurology on call as listed in AMION.

## 2024-04-23 NOTE — Progress Notes (Signed)
 eLink Physician-Brief Progress Note Patient Name: [name] DOB: [birth year] MRN: [id number]   Date of Service  04/23/2024  HPI/Events of Note  Called to evaluate the patient due to the reduced level of consciousness, dysarthria, last known well at 0420.  Reduced localization painful stimulus.  Stroke alert was called.  During the workup, patient was found to be hypoglycemic and D10 was administered.  eICU Interventions  With the administration of D10, patient's symptoms slowly improved.  Reduce dose of glargine to 10 units (from 40 units nightly) Increase return of CBG checks over the next several hours.  Monitor for hypoglycemia, may need to initiate dextrose  infusion if hyperglycemia persists        Sherral Dirocco 04/23/2024, 5:52 AM

## 2024-04-23 NOTE — Progress Notes (Signed)
 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 Given clearance of bacteremia and plan to treat for 8 weeks given the back pain/shoulder pain, have forgone the tee    PLAN: Please remove central line when feasible Tomorrow could replace HD catheter Continue on vancomycin , please plan for 8 weeks starting 7/28, and can get with her dialysis Continue contact precaution isolation ID will sign off Opat and id clinic f/u as below   OPAT Orders Discharge antibiotics to be given during dialysis   Discharge antibiotics: Vanc three times per week with dialysis  Duration: 8 weeks End Date: 06/15/24  [facility] Care Per Protocol:  Home health RN for IV administration and teaching; PICC line care and labs.    Labs weekly while on IV antibiotics: _x_ CBC with differential __ BMP _x_ CMP _x_ CRP _x_ ESR _x_ Vancomycin  trough __ CK    Fax weekly labs to [phone]  Clinic Follow Up Appt: 8/25 @ 945  @  [facility] [address], [place], [place] [address] Phone: [phone]   Principal Problem:   Septic shock (HCC) Active Problems:   Subacute osteomyelitis, right ankle and foot (HCC)   MRSA bacteremia   Bacteremia   Necrotizing fasciitis (HCC)   ESRD (end stage renal disease) (HCC)   Allergies  Allergen  Reactions   Nsaids Other (See Comments)    CKD stage 4   Lisinopril Other (See Comments)    coughing   Peanut-Containing Drug Products Itching and Other (See Comments)    GI intolerance - diarrhea    Scheduled Meds:  vitamin C   1,000 mg Oral Daily   brimonidine   1 drop Both Eyes BID   Chlorhexidine  Gluconate Cloth  6 each Topical Q0600   insulin  aspart  0-15 Units Subcutaneous Q4H   midodrine   10 mg Oral TID WC   nutrition supplement (JUVEN)  1 packet Oral BID BM   vancomycin  variable dose per unstable renal function (pharmacist dosing)   Does not apply See admin instructions   zinc  sulfate (50mg  elemental zinc )  220 mg Oral Daily   Continuous Infusions:  dextrose  50 mL/hr at 04/23/24 1400   norepinephrine  (LEVOPHED ) Adult infusion Stopped (04/22/24 1649)   PRN Meds:.acetaminophen , docusate sodium , fentaNYL  (SUBLIMAZE ) injection, heparin , lidocaine  (PF), lidocaine -prilocaine , mouth rinse, oxyCODONE , pentafluoroprop-tetrafluoroeth, polyethylene glycol, trimethobenzamide    SUBJECTIVE: S/p bka left lower ext; wound vac applied Feels tired overall  Back pain stable moderate Right shoulder pain also stable moderate  Afebrile No more n/v No rash  Central line remains as unable to place peripheral iv's  Review of Systems: ROS All other ROS was negative, except mentioned above     OBJECTIVE: Vitals:   04/23/24 1100 04/23/24 1200 04/23/24 1300 04/23/24 1400  BP: ROLLEN)  111/58 125/66 125/62 125/68  Pulse: 62 61 61 (!) 57  Resp: 10 13 10 19   Temp: (!) 94.5 F (34.7 C)     TempSrc: Rectal     SpO2: 99% 100% 100% 95%  Weight:      Height:       Body mass index is 24.72 kg/m.  Physical Exam General/constitutional: no distress, pleasant HEENT: Normocephalic, PER, Conj Clear, EOMI, Oropharynx clear Neck supple CV: rrr no mrg Lungs: clear to auscultation, normal respiratory effort Abd: Soft, Nontender Ext: no edema Skin: No Rash Neuro: nonfocal MSK: left bka stump  wound vac functioning; right shoulder relatively intact rom but tender to active rom   Left internal jugular cvc site no purulence/tenderness  Lab Results Lab Results  Component Value Date   WBC 16.4 (H) 04/23/2024   HGB 7.5 (L) 04/23/2024   HCT 23.5 (L) 04/23/2024   MCV 80.2 04/23/2024   PLT 136 (L) 04/23/2024    Lab Results  Component Value Date   CREATININE 8.26 (H) 04/23/2024   BUN 85 (H) 04/23/2024   NA 125 (L) 04/23/2024   K 3.2 (L) 04/23/2024   CL 95 (L) 04/23/2024   CO2 20 (L) 04/23/2024    Lab Results  Component Value Date   ALT 12 04/19/2024   AST 13 (L) 04/19/2024   ALKPHOS 106 04/19/2024   BILITOT 2.0 (H) 04/19/2024      Microbiology: Recent Results (from the past 240 hours)  Resp panel by RT-PCR (RSV, Flu A&B, Covid) Anterior Nasal Swab     Status: None   Collection Time: 04/19/24  1:41 PM   Specimen: Anterior Nasal Swab  Result Value Ref Range Status   SARS Coronavirus 2 by RT PCR NEGATIVE NEGATIVE Final   Influenza A by PCR NEGATIVE NEGATIVE Final   Influenza B by PCR NEGATIVE NEGATIVE Final    Comment: (NOTE) The Xpert Xpress SARS-CoV-2/FLU/RSV plus assay is intended as an aid in the diagnosis of influenza from Nasopharyngeal swab specimens and should not be used as a sole basis for treatment. Nasal washings and aspirates are unacceptable for Xpert Xpress SARS-CoV-2/FLU/RSV testing.  Fact Sheet for Patients: BloggerCourse.com  Fact Sheet for Healthcare Providers: SeriousBroker.it  This test is not yet approved or cleared by the [place]  FDA and has been authorized for detection and/or diagnosis of SARS-CoV-2 by FDA under an Emergency Use Authorization (EUA). This EUA will remain in effect (meaning this test can be used) for the duration of the COVID-19 declaration under Section 564(b)(1) of the Act, 21 U.S.C. section 360bbb-3(b)(1), unless the authorization is terminated or revoked.      Resp Syncytial Virus by PCR NEGATIVE NEGATIVE Final    Comment: (NOTE) Fact Sheet for Patients: BloggerCourse.com  Fact Sheet for Healthcare Providers: SeriousBroker.it  This test is not yet approved or cleared by the [place]  FDA and has been authorized for detection and/or diagnosis of SARS-CoV-2 by FDA under an Emergency Use Authorization (EUA). This EUA will remain in effect (meaning this test can be used) for the duration of the COVID-19 declaration under Section 564(b)(1) of the Act, 21 U.S.C. section 360bbb-3(b)(1), unless the authorization is terminated or revoked.  Performed at [facility], [address], [place], [place] [address]   Blood Culture (routine x 2)     Status: Abnormal   Collection Time: 04/19/24  1:41 PM   Specimen: BLOOD  Result Value Ref Range Status   Specimen Description BLOOD LEFT ANTECUBITAL  Final  Special Requests   Final    BOTTLES DRAWN AEROBIC AND ANAEROBIC Blood Culture results may not be optimal due to an inadequate volume of blood received in culture bottles   Culture  Setup Time   Final    GRAM POSITIVE COCCI IN BOTH AEROBIC AND ANAEROBIC BOTTLES CRITICAL VALUE NOTED.  VALUE IS CONSISTENT WITH PREVIOUSLY REPORTED AND CALLED VALUE. Performed at [facility], [address], [place], [place] [address]    Culture (A)  Final    STAPHYLOCOCCUS AUREUS SUSCEPTIBILITIES PERFORMED ON PREVIOUS CULTURE WITHIN THE LAST 5 DAYS. GROUP B STREP(S.AGALACTIAE)ISOLATED    Report Status 04/23/2024 FINAL  Final   Organism ID, Bacteria GROUP B STREP(S.AGALACTIAE)ISOLATED  Final      Susceptibility   Group b strep(s.agalactiae)isolated - MIC*    CLINDAMYCIN  >=1 RESISTANT Resistant     AMPICILLIN <=0.25 SENSITIVE Sensitive     ERYTHROMYCIN  >=8 RESISTANT Resistant     VANCOMYCIN  0.5 SENSITIVE Sensitive     CEFTRIAXONE <=0.12 SENSITIVE Sensitive     LEVOFLOXACIN 1 SENSITIVE Sensitive      PENICILLIN <=0.06 SENSITIVE Sensitive     * GROUP B STREP(S.AGALACTIAE)ISOLATED  Blood Culture (routine x 2)     Status: Abnormal (Preliminary result)   Collection Time: 04/19/24  1:46 PM   Specimen: BLOOD  Result Value Ref Range Status   Specimen Description BLOOD RIGHT ANTECUBITAL  Final   Special Requests   Final    BOTTLES DRAWN AEROBIC AND ANAEROBIC Blood Culture results may not be optimal due to an inadequate volume of blood received in culture bottles   Culture  Setup Time   Final    GRAM POSITIVE COCCI IN BOTH AEROBIC AND ANAEROBIC BOTTLES CRITICAL RESULT CALLED TO, READ BACK BY AND VERIFIED WITH: PHARMD G ABBOTT 04/20/2024 @ 0539 BY AB    Culture (A)  Final    METHICILLIN RESISTANT STAPHYLOCOCCUS AUREUS CULTURE REINCUBATED FOR BETTER GROWTH Performed at [facility], [address] [place], [place] [address]    Report Status PENDING  Incomplete   Organism ID, Bacteria METHICILLIN RESISTANT STAPHYLOCOCCUS AUREUS  Final      Susceptibility   Methicillin resistant staphylococcus aureus - MIC*    CIPROFLOXACIN  >=8 RESISTANT Resistant     ERYTHROMYCIN  >=8 RESISTANT Resistant     GENTAMICIN  <=0.5 SENSITIVE Sensitive     OXACILLIN >=4 RESISTANT Resistant     TETRACYCLINE <=1 SENSITIVE Sensitive     VANCOMYCIN  1 SENSITIVE Sensitive     TRIMETH/SULFA <=10 SENSITIVE Sensitive     CLINDAMYCIN  >=8 RESISTANT Resistant     RIFAMPIN <=0.5 SENSITIVE Sensitive     Inducible Clindamycin  NEGATIVE Sensitive     LINEZOLID 2 SENSITIVE Sensitive     * METHICILLIN RESISTANT STAPHYLOCOCCUS AUREUS  Blood Culture ID Panel (Reflexed)     Status: Abnormal   Collection Time: 04/19/24  1:46 PM  Result Value Ref Range Status   Enterococcus faecalis NOT DETECTED NOT DETECTED Final   Enterococcus Faecium NOT DETECTED NOT DETECTED Final   Listeria monocytogenes NOT DETECTED NOT DETECTED Final   Staphylococcus species DETECTED (A) NOT DETECTED Final    Comment: CRITICAL RESULT CALLED TO, READ  BACK BY AND VERIFIED WITH: PHARMD G ABBOTT 04/20/2024 @ 0539 BY AB    Staphylococcus aureus (BCID) DETECTED (A) NOT DETECTED Final    Comment: Methicillin (oxacillin)-resistant Staphylococcus aureus (MRSA). MRSA is predictably resistant to beta-lactam antibiotics (except ceftaroline). Preferred therapy is vancomycin  unless clinically contraindicated. Patient requires contact precautions if  hospitalized. CRITICAL RESULT CALLED TO, READ BACK BY AND VERIFIED WITH: PHARMD G ABBOTT 04/20/2024 @ 0539 BY AB    Staphylococcus epidermidis NOT DETECTED NOT DETECTED Final   Staphylococcus lugdunensis NOT DETECTED NOT DETECTED Final   Streptococcus species DETECTED (A) NOT DETECTED Final    Comment: CRITICAL RESULT CALLED TO, READ BACK BY AND VERIFIED WITH: PHARMD G ABBOTT 04/20/2024 @ 0539 BY AB    Streptococcus agalactiae DETECTED (A) NOT DETECTED Final    Comment: CRITICAL RESULT CALLED TO, READ BACK BY AND VERIFIED WITH: PHARMD G ABBOTT 04/20/2024 @ 0539 BY AB    Streptococcus pneumoniae NOT DETECTED NOT DETECTED Final   Streptococcus pyogenes NOT DETECTED NOT DETECTED Final   A.calcoaceticus-baumannii NOT DETECTED NOT DETECTED Final   Bacteroides fragilis NOT DETECTED NOT DETECTED Final   Enterobacterales NOT DETECTED NOT DETECTED Final   Enterobacter cloacae complex NOT DETECTED NOT DETECTED Final   Escherichia coli NOT DETECTED NOT DETECTED Final   Klebsiella aerogenes NOT DETECTED NOT DETECTED Final   Klebsiella oxytoca NOT DETECTED NOT DETECTED Final   Klebsiella pneumoniae NOT DETECTED NOT DETECTED Final   Proteus species NOT DETECTED NOT DETECTED Final   Salmonella species NOT DETECTED NOT DETECTED Final   Serratia marcescens NOT DETECTED NOT DETECTED Final   Haemophilus influenzae NOT DETECTED NOT DETECTED Final   Neisseria meningitidis NOT DETECTED NOT DETECTED Final   Pseudomonas aeruginosa NOT DETECTED NOT DETECTED Final   Stenotrophomonas maltophilia NOT DETECTED NOT DETECTED  Final   Candida albicans NOT DETECTED NOT DETECTED Final   Candida auris NOT DETECTED NOT DETECTED Final   Candida glabrata NOT DETECTED NOT DETECTED Final   Candida krusei NOT DETECTED NOT DETECTED Final   Candida parapsilosis NOT DETECTED NOT DETECTED Final   Candida tropicalis NOT DETECTED NOT DETECTED Final   Cryptococcus neoformans/gattii NOT DETECTED NOT DETECTED Final   Meth resistant mecA/C and MREJ DETECTED (A) NOT DETECTED Final    Comment: CRITICAL RESULT CALLED TO, READ BACK BY AND VERIFIED WITH: PHARMD G ABBOTT 04/20/2024 @ 0539 BY AB Performed at [facility], [address], [place], [place] [address]   MRSA Next Gen by PCR, Nasal     Status: None   Collection Time: 04/19/24  7:17 PM   Specimen: Nasal Mucosa; Nasal Swab  Result Value Ref Range Status   MRSA by PCR Next Gen NOT DETECTED NOT DETECTED Final    Comment: (NOTE) The GeneXpert MRSA Assay (FDA approved for NASAL specimens only), is one component of a comprehensive MRSA colonization surveillance program. It is not intended to diagnose MRSA infection nor to guide or monitor treatment for MRSA infections. Test performance is not FDA approved in patients less than 28 years old. Performed at [facility], [address] [place], [place] [address]   Aerobic/Anaerobic Culture w Gram Stain (surgical/deep wound)     Status: None (Preliminary result)   Collection Time: 04/20/24  6:22 PM   Specimen: Soft Tissue, Other  Result Value Ref Range Status   Specimen Description TISSUE  Final   Special Requests RIGHT LEG  Final   Gram Stain NO WBC SEEN NO ORGANISMS SEEN   Final   Culture   Final    NO GROWTH 3 DAYS NO ANAEROBES ISOLATED; CULTURE IN PROGRESS FOR 5 DAYS Performed at [facility], [address], [place], [place] [address]    Report Status PENDING  Incomplete  Culture, blood (Routine X 2) w Reflex to ID Panel  Status: None (Preliminary result)   Collection Time: 04/21/24  6:16 AM    Specimen: BLOOD  Result Value Ref Range Status   Specimen Description BLOOD BLOOD LEFT HAND  Final   Special Requests   Final    BOTTLES DRAWN AEROBIC AND ANAEROBIC Blood Culture adequate volume   Culture   Final    NO GROWTH 2 DAYS Performed at [facility], [address], [place], [place] [address]    Report Status PENDING  Incomplete  Culture, blood (Routine X 2) w Reflex to ID Panel     Status: None (Preliminary result)   Collection Time: 04/21/24  6:21 AM   Specimen: BLOOD  Result Value Ref Range Status   Specimen Description BLOOD BLOOD RIGHT ARM  Final   Special Requests   Final    BOTTLES DRAWN AEROBIC AND ANAEROBIC Blood Culture adequate volume   Culture   Final    NO GROWTH 2 DAYS Performed at [facility], [address], [place], [place] [address]    Report Status PENDING  Incomplete     Serology:   Imaging: If present, new imagings (plain films, ct scans, and mri) have been personally visualized and interpreted; radiology reports have been reviewed. Decision making incorporated into the Impression / Recommendations.   7/29 xray right shoulder FINDINGS: There is no evidence of fracture or dislocation. There is no evidence of arthropathy or other focal bone abnormality. Soft tissues are unremarkable.   IMPRESSION: Negative.   7/29 mri lumbar spine 1. No specific evidence of discitis/osteomyelitis. No visible epidural abscess on this noncontrast study. 2. No significant stenosis.   7/28 tte  1. Left ventricular ejection fraction, by estimation, is 50 to 55%. The  left ventricle has low normal function. Left ventricular endocardial  border not optimally defined to evaluate regional wall motion. There is  mild left ventricular hypertrophy. Left  ventricular diastolic parameters are consistent with Grade I diastolic  dysfunction (impaired relaxation).   2. Right ventricular systolic function is mildly reduced. The right  ventricular size is  mildly enlarged. There is moderately elevated  pulmonary artery systolic pressure. The estimated right ventricular  systolic pressure is 51.0 mmHg.   3. The mitral valve is degenerative. Trivial mitral valve regurgitation.  No evidence of mitral stenosis.   4. The aortic valve is tricuspid. There is mild calcification of the  aortic valve. Aortic valve regurgitation is trivial. No aortic stenosis is  present.   5. The inferior vena cava is dilated in size with <50% respiratory  variability, suggesting right atrial pressure of 15 mmHg.   6. Nondiagnostic agitated saline injection due to image quality.     7/27 ct right ankle 1. Severe ulceration overlying the distal fibula with possible exposure of the periosteum and fibular plate/screws. 2. Collapse and erosion of the talus and tibial plafond with gas along the joint space and resulting cavities in this region compatible with osteomyelitis. Substantial erosions or cystic lesions along the distal fibular tip, the distal tibial margin, and the remaining flattened talus. Gas in the resulting tibiotalar cavity, talonavicular articulation, sinus tarsi, subtalar joints, calcaneocuboid joint, midfoot joints, and Lisfranc joint. Gas also tracks along various tendon groups and adjacent to the tibiofibular syndesmosis. 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 3. Subcutaneous edema tracks along the plantar hindfoot and dorsal forefoot. 4. Thickened medial band of the plantar fascia. 5. Muscular atrophy in the distal calf.  Constance ONEIDA Passer, MD [facility] [phone] pager    04/23/2024, 2:24 PM

## 2024-04-23 NOTE — Progress Notes (Signed)
 Hypoglycemic Event  CBG: 40  Treatment: D50 50 mL (25 gm)  Symptoms: lethargic  Follow-up CBG: Time:1054 CBG Result:144  Possible Reasons for Event: Inadequate meal intake  Comments/MD notified:Prince Ellena MD    [name]

## 2024-04-23 NOTE — Progress Notes (Signed)
 Patient ID: [name], female   DOB: [birth year], 68 y.o.   MRN: [id number] Patient is seen in follow-up for below-knee amputation for necrotizing fasciitis.  The wound cultures from the tissue margin remain negative.  The wound VAC is off a dry dressing in place the dressing is dry.  White blood cell count has steadily decreased in the postoperative period.  I will follow-up as needed.

## 2024-04-23 NOTE — Plan of Care (Signed)
   Problem: Education: Goal: Knowledge of General Education information will improve Description: Including pain rating scale, medication(s)/side effects and non-pharmacologic comfort measures Outcome: Progressing   Problem: Clinical Measurements: Goal: Respiratory complications will improve Outcome: Progressing Goal: Cardiovascular complication will be avoided Outcome: Progressing

## 2024-04-23 NOTE — Progress Notes (Signed)
 NAME:  [name], MRN:  [id number], DOB:  Aug 20, 1956, LOS: 4 ADMISSION DATE:  04/19/2024, CONSULTATION DATE:  04/17/2024 REFERRING MD: EDP CHIEF COMPLAINT: Hypotension  History of Present Illness:  68 year old woman with end-stage renal disease on HD who initially presented with general weakness likely due to sepsis and also found to have osteomyelitis of the right fibula and septic arthritis with exposure of medullary bone of air on x-ray is concerning for necrotizing fasciitis.  Status post right BKA.  Pain is well-controlled.  Doing relatively well  Pertinent  Medical History   Past Medical History:  Diagnosis Date   Anemia    Arthritis    Atrial fibrillation (HCC)    CAD (coronary artery disease)    Chronic kidney disease, stage III (moderate) (HCC)    Constipation    Diabetes mellitus (HCC)    History of blood transfusion    HLD (hyperlipidemia)    Hypertension    Myocardial infarction [facility])    in April 2014     Significant Hospital Events: Including procedures, antibiotic start and stop dates in addition to other pertinent events   Patient did not get dialyzed yesterday because her right IJ was not patent and tPA did not work.  Her pain was however well-controlled patient does not have any complaints this morning. She was able to work with PT yesterday and did very well.  PT will continue to follow  Interim History / Subjective:  7/27 : Admitted 7/28 : ABIs ordered, orthopedics consulted, planning for transtibial amputation 7/28 : Right BKA 7/29 : POD 1 Right BKA  7/29 : TDC removed lab holiday 7/30 : Eliquis  held  Objective   Blood pressure 120/62, pulse (!) 59, temperature (!) 96.2 F (35.7 C), temperature source Axillary, resp. rate 17, weight 71.6 kg, SpO2 99%.        Intake/Output Summary (Last 24 hours) at 04/23/2024 0711 Last data filed at 04/22/2024 1700 Gross per 24 hour  Intake 81.04 ml  Output --  Net 81.04 ml   Filed Weights   04/20/24 0449  04/21/24 1254 04/23/24 0500  Weight: 73 kg 76 kg 71.6 kg    Examination: General: Chronically ill-appearing, laying in bed in no acute distress HENT: Right IJ tunneled catheter in place Lungs: On room air, lungs are clear to auscultation bilaterally Cardiovascular: Regular rate and rhythm Abdomen: Soft nontender, no guarding or rebound tenderness Extremities: Warm and dry Neuro: Alert, responding to questions appropriately, oriented to self time and place GU: Deferred MSK : Right BKA site looks clean/dry and intact   Resolved Hospital Problem list   N/A  Assessment & Plan:  Septic shock MRSA/Group B Strep Bacteremia  -  Likely in the setting of osteomyelitis and septic arthritis -  Blood cultures grew MRSA and group B strep -  Source controlled , TTE negative  -  Continue vancomycin  for bacteremia  -  Wean off Levofed as tolerated.  Right ankle wound - Postop day 2 right BKA - Pain well  controlled  with Tylenol  and fentanyl  and Oxycodone  PRN  - Continue PT/OT   End-stage renal disease on hemodialysis - Replace TDC today -Appreciate IR recs - Resume Eliquis  after procedure - Follow-up with nephrology for possible dialysis today as her nausea  could be due to buildup of toxins  Type 2 diabetes - Low blood sugars this morning likely due to insulin  retention from not getting dialysis - Start D10  - CGM  DVT ppx - Resume Eliquis   after [facility] is removed Best Practice (right click and Reselect all SmartList Selections daily)   Diet/type: NPO w/ oral meds DVT prophylaxis: DOAC GI prophylaxis: N/A Lines: Central line Foley:  N/A Code Status:  DNR Last date of multidisciplinary goals of care discussion [as above]  Labs   CBC: Recent Labs  Lab 04/19/24 1341 04/20/24 0340 04/21/24 0520 04/22/24 0500  WBC 28.5* 30.2* 19.1* 17.8*  NEUTROABS 27.4*  --   --   --   HGB 9.4* 9.1* 7.8* 7.7*  HCT 29.2* 27.5* 23.7* 23.7*  MCV 82.7 79.5* 78.7* 79.5*  PLT 225 244 156 194     Basic Metabolic Panel: Recent Labs  Lab 04/19/24 1341 04/19/24 2016 04/20/24 0340 04/21/24 0520 04/22/24 0500  NA 128* 130* 128* 128* 126*  K 3.9 3.8 3.3* 3.4* 3.4*  CL 92* 94* 92* 95* 94*  CO2 17* 17* 21* 22 21*  GLUCOSE 391* 436* 374* 124* 142*  BUN 54* 51* 55* 58* 74*  CREATININE 7.17* 6.96* 6.89* 6.86* 7.33*  CALCIUM  8.0* 7.9* 8.1* 8.0* 7.6*  MG  --   --  1.9 2.0  --   PHOS  --   --  3.9 4.2 4.3   GFR: Estimated Creatinine Clearance: 7.1 mL/min (A) (by C-G formula based on SCr of 7.33 mg/dL (H)). Recent Labs  Lab 04/19/24 1341 04/19/24 1409 04/19/24 2016 04/20/24 0340 04/21/24 0520 04/22/24 0500  WBC 28.5*  --   --  30.2* 19.1* 17.8*  LATICACIDVEN  --  2.6* 1.5  --   --   --     Liver Function Tests: Recent Labs  Lab 04/19/24 1341 04/22/24 0500  AST 13*  --   ALT 12  --   ALKPHOS 106  --   BILITOT 2.0*  --   PROT 5.4*  --   ALBUMIN  1.5* <1.5*   No results for input(s): LIPASE, AMYLASE in the last 168 hours. No results for input(s): AMMONIA in the last 168 hours.  ABG    Component Value Date/Time   HCO3 29.4 (H) 08/01/2010 0805   TCO2 21 01/08/2013 1242   ACIDBASEDEF 7.0 (H) 06/14/2009 1616   O2SAT 59.0 08/01/2010 0805     Coagulation Profile: Recent Labs  Lab 04/19/24 1341  INR 1.1    Cardiac Enzymes: No results for input(s): CKTOTAL, CKMB, CKMBINDEX, TROPONINI in the last 168 hours.  HbA1C: HbA1c, POC (controlled diabetic range)  Date/Time Value Ref Range Status  02/28/2021 02:50 PM 6.4 0.0 - 7.0 % Final  06/16/2020 01:52 PM 6.6 0.0 - 7.0 % Final   Hgb A1c MFr Bld  Date/Time Value Ref Range Status  10/28/2023 05:22 AM 6.6 (H) 4.8 - 5.6 % Final    Comment:    (NOTE) Pre diabetes:          5.7%-6.4%  Diabetes:              >6.4%  Glycemic control for   <7.0% adults with diabetes     CBG: Recent Labs  Lab 04/22/24 1621 04/22/24 2025 04/22/24 2202 04/23/24 0542 04/23/24 0556  GLUCAP 87 97 88 <10* 83     Review of Systems:   Endorses generalized weakness.  Denies any chest pain or shortness of breath  Past Medical History:  She,  has a past medical history of Anemia, Arthritis, Atrial fibrillation (HCC), CAD (coronary artery disease), Chronic kidney disease, stage III (moderate) (HCC), Constipation, Diabetes mellitus (HCC), History of blood transfusion, HLD (hyperlipidemia), Hypertension, and Myocardial infarction (  [facility]).   Surgical History:   Past Surgical History:  Procedure Laterality Date   AMPUTATION Right 04/20/2024   Procedure: AMPUTATION BELOW KNEE;  Surgeon: Harden Jerona GAILS, MD;  [facility] OR;  Service: Orthopedics;  Laterality: Right;   ANKLE SURGERY Right    x 6 total   BREAST BIOPSY Left    15 or 20 yearsa ago she cant remember laterality    HARDWARE REMOVAL Right 02/13/2013   Procedure: HARDWARE REMOVAL;  Surgeon: Jerona GAILS Harden, MD;  Location: [facility] OR;  Service: Orthopedics;  Laterality: Right;  Right Ankle Removal Hardware, Debridement, Place Wound VAC   IR FLUORO GUIDE CV LINE LEFT  11/08/2023   IR FLUORO GUIDE CV LINE RIGHT  11/01/2023   IR FLUORO GUIDE CV LINE RIGHT  11/12/2023   IR REMOVAL TUN CV CATH W/O FL  04/21/2024   IR US  GUIDE VASC ACCESS LEFT  11/08/2023   IR US  GUIDE VASC ACCESS RIGHT  11/01/2023   IR US  GUIDE VASC ACCESS RIGHT  11/12/2023   laser surgery for diabetic retinopathy Bilateral    LEFT HEART CATHETERIZATION WITH CORONARY ANGIOGRAM N/A 01/09/2013   Procedure: LEFT HEART CATHETERIZATION WITH CORONARY ANGIOGRAM;  Surgeon: Toribio JONELLE Fuel, MD;  Location: [facility] CATH LAB;  Service: Cardiovascular;  Laterality: N/A;   ORIF ANKLE FRACTURE Right 01/12/2013   Procedure: OPEN REDUCTION INTERNAL FIXATION (ORIF) ANKLE FRACTURE;  Surgeon: Jerona GAILS Harden, MD;  Location: [facility] OR;  Service: Orthopedics;  Laterality: Right;   PARTIAL COLECTOMY N/A 11/02/2023   Procedure: OPEN PARTIAL COLECTOMY WITH END COLOSTOMY;  Surgeon: Dasie Leonor CROME, MD;  Location: [facility] OR;  Service:  General;  Laterality: N/A;   PARTIAL HYSTERECTOMY     TUNNELLED CATHETER EXCHANGE N/A 04/15/2024   Procedure: TUNNELLED CATHETER EXCHANGE;  Surgeon: Pearline Norman RAMAN, MD;  Location: [facility] LAB;  Service: Cardiovascular;  Laterality: N/A;     Social History:   reports that she has never smoked. She has never used smokeless tobacco. She reports that she does not drink alcohol  and does not use drugs.   Family History:  Her family history includes Cervical cancer in her sister; Colon cancer (age of onset: 28) in her mother; Congestive Heart Failure in her sister; Diabetes in her brother, mother, and sister; Heart disease in her father and maternal grandmother; Hypertension in her father; Kidney disease in her brother; Sickle cell anemia in her son; Thyroid  disease in her son. There is no history of Breast cancer or BRCA 1/2.   Allergies Allergies  Allergen Reactions   Nsaids Other (See Comments)    CKD stage 4   Lisinopril Other (See Comments)    coughing   Peanut-Containing Drug Products Itching and Other (See Comments)    GI intolerance - diarrhea     Home Medications  Prior to Admission medications   Medication Sig Start Date End Date Taking? Authorizing Provider  albuterol  (PROVENTIL ) (2.5 MG/3ML) 0.083% nebulizer solution Take 3 mLs (2.5 mg total) by nebulization every 2 (two) hours as needed for wheezing. 11/25/23  Yes Ghimire, Donalda HERO, MD  apixaban  (ELIQUIS ) 2.5 MG TABS tablet Take 1 tablet (2.5 mg total) by mouth 2 (two) times daily. 10/04/22  Yes Acharya, Gayatri A, MD  brimonidine  (ALPHAGAN ) 0.2 % ophthalmic solution Place 1 drop into both eyes 2 (two) times daily.   Yes [provider]  calcitRIOL  (ROCALTROL ) 0.25 MCG capsule TAKE 1 CAPSULE (0.25 MCG TOTAL) EVERY MONDAY, WEDNESDAY, AND FRIDAY Patient taking differently: Take 0.25  mcg by mouth in the morning and at bedtime. 06/22/22  Yes Christia Budds, MD  carvedilol  (COREG ) 12.5 MG tablet TAKE 1 TABLET (12.5 MG TOTAL)  BY MOUTH 2 (TWO) TIMES DAILY WITH A MEAL. 12/22/21  Yes Autry-Lott, Rojean, DO  insulin  glargine (LANTUS ) 100 UNIT/ML injection Inject 0.07 mLs (7 Units total) into the skin daily. Please schedule appointment before next refill. Patient taking differently: Inject 10 Units into the skin daily. Please schedule appointment before next refill. 02/01/24  Yes [name], MD  latanoprost  (XALATAN ) 0.005 % ophthalmic solution Place 1 drop into both eyes at bedtime.   Yes [provider]  melatonin 5 MG TABS Take 10 mg by mouth at bedtime as needed (for insomnia).   Yes [provider]  midodrine  (PROAMATINE ) 2.5 MG tablet Take 2.5 mg by mouth.   Yes [provider]  Multiple Vitamin (MULTIVITAMIN WITH MINERALS) TABS Take 1 tablet by mouth daily.   Yes [provider]  NOVOLIN R 100 UNIT/ML injection Inject 0-9 Units into the skin in the morning, at noon, and at bedtime. Per sliding scale 04/03/24  Yes [provider]  ACCU-CHEK AVIVA PLUS test strip CHECK BLOOD GLUCOSE THREE TIMES DAILY 11/05/22   Joshua Domino, DO  amLODipine  (NORVASC ) 5 MG tablet Take 0.5 tablets (2.5 mg total) by mouth at bedtime. Patient not taking: Reported on 03/26/2024 02/01/24 03/26/24  [name], MD  Blood Glucose Monitoring Suppl (ACCU-CHEK AVIVA PLUS) w/Device KIT CHECK BLOOD GLUCOSE THREE TIMES DAILY 10/26/20   Autry-Lott, Rojean, DO  collagenase  (SANTYL ) 250 UNIT/GM ointment Apply 1 Application topically daily. Apply nickel thick amount to ulcer Right lateral ankle 3 x 2 cm with muscle/fascia capsule exposed Patient not taking: Reported on 04/19/2024 04/15/24 05/15/24  Lamount Ethan CROME, DPM  DROPLET INSULIN  SYRINGE 31G X 5/16 0.5 ML MISC USE FOUR TIMES DAILY FOR INJECTIONS 09/03/22   Joshua Domino, DO  furosemide  (LASIX ) 40 MG tablet Take 40 mg by mouth 2 (two) times daily. Patient not taking: Reported on 04/19/2024    [provider]  insulin  aspart (NOVOLOG  FLEXPEN) 100  UNIT/ML FlexPen 0-9 Units, Subcutaneous, 3 times daily with meals CBG < 70: Implement Hypoglycemia measures CBG 70 - 120: 0 units CBG 121 - 150: 1 unit CBG 151 - 200: 2 units CBG 201 - 250: 3 units CBG 251 - 300: 5 units CBG 301 - 350: 7 units CBG 351 - 400: 9 units CBG > 400: call MD Patient not taking: Reported on 04/19/2024 11/25/23   Raenelle Donalda HERO, MD  Insulin  Syringes, Disposable, U-100 0.5 ML MISC 4x daily injections 03/21/18   Riccio, Angela C, DO  Lancet Devices (ACCU-CHEK SOFTCLIX) lancets Fill for 1 month for TID testing. Use as instructed 11/13/13   Curtis Hadassah DASEN, MD  NEEDLE, DISP, 30 G (BD DISP NEEDLES) 30G X 1/2 MISC For 4x daily injections 11/05/15   Phelps, Jazma Y, DO  REPATHA SURECLICK 140 MG/ML SOAJ Inject 140 mg into the skin every 14 (fourteen) days. Patient not taking: Reported on 03/26/2024 09/16/23   [provider]  VITAMIN D , CHOLECALCIFEROL, PO Take 1 tablet by mouth daily. Patient not taking: Reported on 04/19/2024    [provider]     Critical care time: 40 minutes      Drue Grow MD [facility] Internal Medicine Program - PGY-2 04/23/2024, 7:11 AM Pager# [phone]

## 2024-04-23 NOTE — Consult Note (Signed)
 WOC requested while in the unit to assist with ostomy needs, pt is dependent in ostomy care, has had stoma since February 2025.   Ostomy pouching: 1pc. Convex Gerlean #[id number], and barrier ring [phone]   Ostomy care orders entered with needed Gerlean numbers as above and I have requested the unit secretary to order the same for patient.     Re consult if needed, will not follow at this time. Thanks  Indy Kuck [employer], RN,CWOCN, CNS, [employer] [phone]

## 2024-04-23 NOTE — TOC Progression Note (Addendum)
 Transition of Care [facility]) - Progression Note    Patient Details  Name: [name] MRN: [id number] Date of Birth: 1956-03-05  [facility] CM/SW Contact  Lauraine FORBES Saa, LCSW Phone Number: 04/23/2024, 11:30 AM  Clinical Narrative:     11:32 AM CSW provided patient with current SNF options ([address], [address], [address], [place], [facility]) and their [facility]. Patient declined bed at [facility] and expressed interest in [facility]. CSW sent FL2 to [facility].   11:44 AM CSW informed patient of [facility] SNF bed denial and of need to change outpatient dialysis clinics pending SNF location. Patient expressed understanding of the information and interest in expanding SNF search to [place] area. CSW expanded SNF search to [facility], [facility] [place], [facility] [place], [address] [facility] [place], [address] [facility], [place], [address] [facility] [address], and [facility].  1:27 PM [facility] SNF admissions declined bed offer due to lack of availability. CSW awaiting bed offer decisions from [facility] [place], [facility] [place], [facility] [place], [address] [facility] [place], [address] [facility] [place] [address], and [facility].  2:24 PM CSW attempted to call [facility] admissions, [facility] [place] [facility] admissions, [facility] [place] SNF admissions, [facility] [place] [facility] admissions, [facility] admissions and [facility] SNF admissions. There were no responses and voicemails were left.  Expected Discharge Plan: Skilled Nursing Facility Barriers to Discharge: Continued Medical Work up, Other (must enter comment), English as a second language teacher (SNF pending bed decision)               Expected Discharge  Plan and Services In-house Referral: Clinical Social Work   Post Acute Care Choice: Skilled Nursing Facility Living arrangements for the past 2 months: Single Family Home                                       Social Drivers of Health (SDOH) Interventions SDOH Screenings   Food Insecurity: No Food Insecurity (04/19/2024)  Housing: Low Risk  (04/19/2024)  Transportation Needs: No Transportation Needs (04/19/2024)  Utilities: Not At Risk (04/19/2024)  Depression (PHQ2-9): Low Risk  (02/28/2021)  Financial Resource Strain: Low Risk  (08/25/2021)   Received from [facility]  Physical Activity: Insufficiently Active (06/20/2021)   Received from [facility]  Social Connections: Socially Isolated (04/19/2024)  Stress: No Stress Concern Present (08/25/2021)   Received from [facility]  Tobacco Use: Low Risk  (03/26/2024)    Readmission Risk Interventions     No data to display

## 2024-04-23 NOTE — Progress Notes (Signed)
 Pharmacy Antibiotic Note  [name] is a 68 y.o. female admitted on 04/19/2024 with bacteremia.  Pharmacy has been consulted for Vancomycin  dosing. Echo shows no vegetation, MRI neg for osteo (although early). Planning for 6wks abx from negative cultures.   Received loading dose of Vancomycin  1500 mg on 7/27 and 750mg  IV x1 on 7/29. VR this AM = 31mcg/mL (therapeutic)  Plan: Vancomycin  750 mg IV post-HD x1.  However given tentative HD schedule (pending IR replacement of catheter plus HD schedule) will defer scheduling dose until AM.  Due to unclear HD schedule vanc variable dosing order remains active on MAR as a place holder.  Monitor vanc levels PRN per protocol.  Height: 5' 7 (170.2 cm) Weight: 71.6 kg (157 lb 13.6 oz) IBW/kg (Calculated) : 61.6  Temp (24hrs), Avg:95.9 F (35.5 C), Min:94.5 F (34.7 C), Max:97.3 F (36.3 C)  Recent Labs  Lab 04/19/24 1341 04/19/24 1409 04/19/24 2016 04/20/24 0340 04/21/24 0520 04/22/24 0500 04/23/24 0546 04/23/24 0852  WBC 28.5*  --   --  30.2* 19.1* 17.8*  --  16.4*  CREATININE 7.17*  --  6.96* 6.89* 6.86* 7.33*  --  8.26*  LATICACIDVEN  --  2.6* 1.5  --   --   --   --   --   VANCORANDOM  --   --   --   --  15  --  20  --     Estimated Creatinine Clearance: 6.3 mL/min (A) (by C-G formula based on SCr of 8.26 mg/dL (H)).    Allergies  Allergen Reactions   Nsaids Other (See Comments)    CKD stage 4   Lisinopril Other (See Comments)    coughing   Peanut-Containing Drug Products Itching and Other (See Comments)    GI intolerance - diarrhea    Antimicrobials this admission: Clindamycin  7/27 >> 7/28 Cefepime  7/27 >> 7/28 Vancomycin  7/27 >  Dose adjustments this admission: N/A  Microbiology results: 7/27 MRSA neg 7/27 RVP neg  7/27 BCX 2/4 BCX MRSA, GBS  7/28 Leg cx NGTDx2 7/29 Bcx NGTDx1   Thank you for allowing pharmacy to be a part of this patient's care.  Sharyne Glatter, PharmD, BCCCP Critical Care  Clinical Pharmacist 04/23/2024 12:02 PM

## 2024-04-23 NOTE — Progress Notes (Signed)
 Nephrology Follow-Up Consult note  Outpatient dialysis unit: [facility] prescription: F180, 350 BFR, EDW 69.5 (weight was 67.4 post HD on 7/25), 4hrs, 3K, 2.5 Ca, RIJ TDC, mircera 100mcg given 7/25, was on IV iron , calcitriol  0.25mcg on home meds list (unclear if taking, no binders   Assessment/Recommendations: [name] is a/an 68 y.o. female with a past medical history notable for ESRD on HD admitted with sepsis c/b shock   # ESRD: Dialysis on MWF schedule outpatient.  Held dialysis Monday because of hypotension and unable to receive on Tuesday because of catheter issue.  Plan for dialysis today after tunneled catheter placed   # Volume/ hypertension: Continue with ultrafiltration as tolerated on dialysis.  At this time limited by hypotension.  Will need lower dry weight at discharge   # Anemia of Chronic Kidney Disease: Hemoglobin below goal in the sevens.  Received Mircera on 7/25.  Hold IV iron  for now.  Transfusion if needed   # Secondary Hyperparathyroidism/Hyperphosphatemia: Corrected calcium  normal.  Albumin  very low associated with infection.  Not on any binders but phosphorus at goal   # Vascular access: TDC nonfunctional on 7/29.  Line holiday for now.  Plan for IR replacement on 7/31.  Appreciate help   # Septic shock: Antibiotics per primary team.  Shock improved.  No pressor requirement at this time   # Osteomyelitis/necrotizing fasciitis/MRSA bacteremia: Status post surgery with amputation on 7/28.  Also with line holiday as above     # Additional recommendations: - Dose all meds for creatinine clearance < 10 ml/min  - Unless absolutely necessary, no MRIs with gadolinium.  - Implement save arm precautions.  Prefer needle sticks in the dorsum of the hands or wrists.  No blood pressure measurements in arm. - If blood transfusion is requested during hemodialysis sessions, please alert us  prior to the session.  - Use synthetic opioids (Fentanyl /Dilaudid )  if needed   Recommendations were discussed with the primary team.   Recommendations conveyed to primary service.    [name] [facility] 04/23/2024 9:47 AM  ___________________________________________________________  CC: Weakness  Interval History/Subjective: Patient is more tired today having some pain but overall okay without complaints.  Medications:  Current Facility-Administered Medications  Medication Dose Route Frequency Provider Last Rate Last Admin   acetaminophen  (TYLENOL ) tablet 650 mg  650 mg Oral Q6H PRN Amoako, Prince, MD   650 mg at 04/21/24 1341   ascorbic acid  (VITAMIN C ) tablet 1,000 mg  1,000 mg Oral Daily Harden Jerona GAILS, MD   1,000 mg at 04/22/24 1205   brimonidine  (ALPHAGAN ) 0.2 % ophthalmic solution 1 drop  1 drop Both Eyes BID Paliwal, Aditya, MD   1 drop at 04/22/24 2252   Chlorhexidine  Gluconate Cloth 2 % PADS 6 each  6 each Topical Daily Jude Harden GAILS, MD   6 each at 04/23/24 0000   Chlorhexidine  Gluconate Cloth 2 % PADS 6 each  6 each Topical Q0600 Harden Jerona GAILS, MD   6 each at 04/23/24 0000   dextrose  10 % infusion   Intravenous Continuous Hunsucker, Donnice SAUNDERS, MD       docusate sodium  (COLACE) capsule 100 mg  100 mg Oral BID PRN Harden Jerona GAILS, MD       fentaNYL  (SUBLIMAZE ) injection 12.5 mcg  12.5 mcg Intravenous Q4H PRN Renne Homans, MD   12.5 mcg at 04/22/24 2041   heparin  injection 1,000 Units  1,000 Units Dialysis PRN Player Jayson JINNY, MD  insulin  aspart (novoLOG ) injection 0-15 Units  0-15 Units Subcutaneous Q4H Paliwal, Aditya, MD       lidocaine  (PF) (XYLOCAINE ) 1 % injection 5 mL  5 mL Intradermal PRN Macel Jayson PARAS, MD       lidocaine -prilocaine  (EMLA ) cream 1 Application  1 Application Topical PRN Dragon Thrush J, MD       midodrine  (PROAMATINE ) tablet 10 mg  10 mg Oral TID WC Duda, Marcus V, MD   10 mg at 04/23/24 9192   norepinephrine  (LEVOPHED ) 4mg  in (0.016 mg/mL) premix infusion  0-40 mcg/min  Intravenous Continuous Hunsucker, Donnice SAUNDERS, MD   Stopped at 04/22/24 1649   nutrition supplement (JUVEN) (JUVEN) powder packet 1 packet  1 packet Oral BID BM Harden Jerona GAILS, MD   1 packet at 04/22/24 1205   Oral care mouth rinse  15 mL Mouth Rinse PRN Harden Jerona GAILS, MD       oxyCODONE  (Oxy IR/ROXICODONE ) immediate release tablet 5 mg  5 mg Oral Q6H PRN Amoako, Prince, MD   5 mg at 04/23/24 0026   pentafluoroprop-tetrafluoroeth (GEBAUERS) aerosol 1 Application  1 Application Topical PRN Marlyne Totaro J, MD       polyethylene glycol (MIRALAX  / GLYCOLAX ) packet 17 g  17 g Oral Daily PRN Harden Jerona GAILS, MD       trimethobenzamide  (TIGAN ) injection 200 mg  200 mg Intramuscular Q6H PRN Amoako, Prince, MD   200 mg at 04/22/24 1508   vancomycin  variable dose per unstable renal function (pharmacist dosing)   Does not apply See admin instructions Merilee Linsey I, [facility]       zinc  sulfate (50mg  elemental zinc ) capsule 220 mg  220 mg Oral Daily Harden Jerona GAILS, MD   220 mg at 04/22/24 1205      Review of Systems: 10 systems reviewed and negative except per interval history/subjective  Physical Exam: Vitals:   04/23/24 0700 04/23/24 0910  BP: 106/62   Pulse: 60   Resp: 13   Temp:  (!) 94.6 F (34.8 C)  SpO2: 100%    No intake/output data recorded.  Intake/Output Summary (Last 24 hours) at 04/23/2024 0947 Last data filed at 04/22/2024 1700 Gross per 24 hour  Intake 52.6 ml  Output --  Net 52.6 ml   Constitutional: Tired but lying in bed in no distress ENMT: ears and nose without scars or lesions, MMM CV: normal rate, trace edema in the left upper extremities as well as lower extremities, 1+ edema in the right upper extremity Respiratory: Bilateral chest rise, normal work of breathing Gastrointestinal: soft, non-tender, no palpable masses or hernias Skin: no visible lesions or rashes Extremities: Right lower extremity absent below the knee, wound covered Psych: alert, judgement/insight  appropriate, appropriate mood and affect   Test Results I personally reviewed new and old clinical labs and radiology tests Lab Results  Component Value Date   NA 126 (L) 04/22/2024   K 3.4 (L) 04/22/2024   CL 94 (L) 04/22/2024   CO2 21 (L) 04/22/2024   BUN 74 (H) 04/22/2024   CREATININE 7.33 (H) 04/22/2024   GFR 17.34 (L) 04/12/2021   CALCIUM  7.6 (L) 04/22/2024   ALBUMIN  <1.5 (L) 04/22/2024   PHOS 4.3 04/22/2024    CBC Recent Labs  Lab 04/19/24 1341 04/20/24 0340 04/21/24 0520 04/22/24 0500 04/23/24 0852  WBC 28.5*   < > 19.1* 17.8* 16.4*  NEUTROABS 27.4*  --   --   --   --   HGB  9.4*   < > 7.8* 7.7* 7.5*  HCT 29.2*   < > 23.7* 23.7* 23.5*  MCV 82.7   < > 78.7* 79.5* 80.2  PLT 225   < > 156 194 136*   < > = values in this interval not displayed.

## 2024-04-23 NOTE — Procedures (Signed)
 Interventional Radiology Procedure Note  Procedure: Tunneled hemodialysis catheter placement.  Findings: Please refer to procedural dictation for full description. Occluded right internal jugular vein.  Left internal jugular 23 cm tunneled HD catheter placed.  Complications:  None immediate  Estimated Blood Loss: < 5 mL  Recommendations: Catheter ready for immediate use.   Ester Sides, MD

## 2024-04-23 NOTE — Progress Notes (Signed)
 Physical Therapy Treatment Patient Details Name: [name] MRN: [id number] DOB: [birth year] Today's Date: 04/23/2024   History of Present Illness 68 yo female adm 04/19/24 with sepsis, hypotension and Rt food wound. 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 PMHx: ESRD on HD, colostomy, T2DM, Afib, Rt ankle ORIF, CAD    PT Comments  The pt remains motivated to improve despite her fatigue this date. She is currently requiring min-modA for bed mobility and modA to transfer sit <> stand with HHA. She reports fear of falling with standing attempts. Focused part of session on increasing/maintaining strength in her bil lower extremities through performing seated lower extremity therapeutic exercises. She continues to not want to see her R residual limb yet. Will continue to follow acutely. As pt's payor will not approve intensive inpatient rehab, updated d/c recs to less intensive inpatient rehab, < 3 hours/day.      If plan is discharge home, recommend the following: A lot of help with walking and/or transfers;A lot of help with bathing/dressing/bathroom;Help with stairs or ramp for entrance;Assist for transportation;Assistance with cooking/housework   Can travel by private vehicle     No  Equipment Recommendations  Wheelchair (measurements PT);Wheelchair cushion (measurements PT);Rolling walker (2 wheels);BSC/3in1 (drop-arm BSC)    Recommendations for Other Services       Precautions / Restrictions Precautions Precautions: Fall;Other (comment) (new R BKA) Recall of Precautions/Restrictions: Impaired Precaution/Restrictions Comments: hypoglycemic events Restrictions Weight Bearing Restrictions Per Provider Order: Yes RLE Weight Bearing Per Provider Order: Non weight bearing     Mobility  Bed Mobility Overal bed mobility: Needs Assistance Bed Mobility: Sit to Supine, Supine to Sit     Supine to sit: Mod assist, HOB elevated, Used rails Sit to supine: Min assist, HOB elevated   General bed mobility  comments: Pt needed cues to sequence bringing legs off L EOB and to grab bed rails to pull up to sit. ModA needed at trunk with pt pulling up on therapist with R UE to ascend trunk to sit up, modA using bed pad to scoot hips to EOB. MinA needed at L leg to lift onto bed with return to supine after cuing pt to lean laterally to L elbow to descend trunk.    Transfers Overall transfer level: Needs assistance Equipment used: 1 person hand held assist Transfers: Sit to/from Stand Sit to Stand: Mod assist           General transfer comment: Pt holding onto therapist anterior to her for support, performing x5 mini sit <> stand reps (only clearing buttocks from EOB before sitting again) followed by x1 full sit <> stand rep. ModA needed to power up to stand and gain balance, cuing pt to extend her L leg and hips.    Ambulation/Gait               General Gait Details: unable at this time   Stairs             Wheelchair Mobility     Tilt Bed    Modified Rankin (Stroke Patients Only)       Balance Overall balance assessment: Needs assistance Sitting-balance support: Bilateral upper extremity supported, Feet supported Sitting balance-Leahy Scale: Fair Sitting balance - Comments: statically   Standing balance support: Bilateral upper extremity supported, During functional activity Standing balance-Leahy Scale: Poor Standing balance comment: reliant on external support  Communication Communication Communication: No apparent difficulties  Cognition Arousal: Alert (mild lethargy) Behavior During Therapy: Flat affect, Anxious   PT - Cognitive impairments: No family/caregiver present to determine baseline                       PT - Cognition Comments: seemed slower to initiate, but could be related to hypoglycemic events today; reported fear of falling Following commands: Impaired Following commands impaired: Follows one  step commands with increased time, Follows multi-step commands inconsistently    Cueing Cueing Techniques: Verbal cues, Gestural cues, Tactile cues  Exercises General Exercises - Lower Extremity Mini-Sqauts: AROM, Strengthening, Left, 5 reps, Other (comment) (partial sit <> stands with modA and bil HHA) Amputee Exercises Hip ABduction/ADduction: AROM, Strengthening, Both, 10 reps, Seated (against manual resistance by PT) Hip Flexion/Marching: AROM, Strengthening, Both, 10 reps, Seated Knee Extension: AROM, Strengthening, Both, 10 reps, Seated    General Comments General comments (skin integrity, edema, etc.): VSS on RA      Pertinent Vitals/Pain Pain Assessment Pain Assessment: Faces Faces Pain Scale: Hurts little more Pain Location: R residual limb Pain Descriptors / Indicators: Discomfort, Grimacing, Guarding Pain Intervention(s): Limited activity within patient's tolerance, Monitored during session, Repositioned    Home Living                          Prior Function            PT Goals (current goals can now be found in the care plan section) Acute Rehab PT Goals Patient Stated Goal: get better PT Goal Formulation: With patient Time For Goal Achievement: 05/05/24 Potential to Achieve Goals: Good Progress towards PT goals: Progressing toward goals    Frequency    Min 3X/week      PT Plan      Co-evaluation              AM-PAC PT 6 Clicks Mobility   Outcome Measure  Help needed turning from your back to your side while in a flat bed without using bedrails?: A Lot Help needed moving from lying on your back to sitting on the side of a flat bed without using bedrails?: A Lot Help needed moving to and from a bed to a chair (including a wheelchair)?: A Lot Help needed standing up from a chair using your arms (e.g., wheelchair or bedside chair)?: A Lot Help needed to walk in hospital room?: Total Help needed climbing 3-5 steps with a railing? :  Total 6 Click Score: 10    End of Session Equipment Utilized During Treatment: Gait belt Activity Tolerance: Patient tolerated treatment well Patient left: with call bell/phone within reach;in bed;with bed alarm set Nurse Communication: Mobility status PT Visit Diagnosis: Difficulty in walking, not elsewhere classified (R26.2);Unsteadiness on feet (R26.81);Muscle weakness (generalized) (M62.81)     Time: 8644-8580 PT Time Calculation (min) (ACUTE ONLY): 24 min  Charges:    $Therapeutic Exercise: 8-22 mins $Therapeutic Activity: 8-22 mins PT General Charges $$ ACUTE PT VISIT: 1 Visit                     Theo Ferretti, PT, DPT Acute Rehabilitation Services  Office: [phone]    Theo CHRISTELLA Ferretti 04/23/2024, 3:57 PM

## 2024-04-23 NOTE — Progress Notes (Signed)
 eLink Physician-Brief Progress Note Patient Name: [name] DOB: 31-Oct-1955 MRN: [id number]   Date of Service  04/23/2024  HPI/Events of Note  68 year old woman present with septic shock hardware visible and lower extremity.   Had a severe hypoglycemic event this morning.  CBG checks are scheduled for Q2HR now and Insulin  scheduled for Q4HR  Glargine administered 24 hours ago  eICU Interventions  maintain current CBG checks and insulin  regimen     Intervention Category Intermediate Interventions: Hyperglycemia - evaluation and treatment  [name] 04/23/2024, 8:19 PM

## 2024-04-23 NOTE — Progress Notes (Addendum)
 Hypoglycemic Event  CBG: 31  Treatment: D50 50 mL (25 gm)  Symptoms: lethargic  Follow-up CBG: Time:0742 CBG Result: 114   Possible Reasons for Event: Inadequate meal intake  Comments/MD notified:Prince Renne MD    Josette JONETTA Morones

## 2024-04-23 NOTE — Progress Notes (Signed)
 PHARMACY CONSULT NOTE FOR:  OUTPATIENT  PARENTERAL ANTIBIOTIC THERAPY with Dialysis   Indication: MRSA Bacteremia Regimen: Vancomycin  750 mg with HD MWF  End date: 06/16/24  No formal OPAT will be done as patient will receive antibiotics with dialysis. Will leave informational order for discharge.     Thank you for allowing pharmacy to be a part of this patient's care.  Damien Quiet, PharmD, BCPS, BCIDP Infectious Diseases Clinical Pharmacist Phone: [phone] 04/23/2024, 3:27 PM

## 2024-04-24 ENCOUNTER — Inpatient Hospital Stay (HOSPITAL_COMMUNITY)

## 2024-04-24 ENCOUNTER — Other Ambulatory Visit (HOSPITAL_COMMUNITY)

## 2024-04-24 DIAGNOSIS — R609 Edema, unspecified: Secondary | ICD-10-CM | POA: Diagnosis not present

## 2024-04-24 DIAGNOSIS — E119 Type 2 diabetes mellitus without complications: Secondary | ICD-10-CM | POA: Diagnosis not present

## 2024-04-24 DIAGNOSIS — R6521 Severe sepsis with septic shock: Secondary | ICD-10-CM | POA: Diagnosis not present

## 2024-04-24 DIAGNOSIS — N186 End stage renal disease: Secondary | ICD-10-CM | POA: Diagnosis not present

## 2024-04-24 DIAGNOSIS — A419 Sepsis, unspecified organism: Secondary | ICD-10-CM | POA: Diagnosis not present

## 2024-04-24 LAB — BASIC METABOLIC PANEL WITH GFR
Anion gap: 9 (ref 5–15)
BUN: 35 mg/dL — ABNORMAL HIGH (ref 8–23)
CO2: 25 mmol/L (ref 22–32)
Calcium: 7.1 mg/dL — ABNORMAL LOW (ref 8.9–10.3)
Chloride: 95 mmol/L — ABNORMAL LOW (ref 98–111)
Creatinine, Ser: 4.21 mg/dL — ABNORMAL HIGH (ref 0.44–1.00)
GFR, Estimated: 11 mL/min — ABNORMAL LOW (ref >60)
Glucose, Bld: 133 mg/dL — ABNORMAL HIGH (ref 70–99)
Potassium: 3.1 mmol/L — ABNORMAL LOW (ref 3.5–5.1)
Sodium: 129 mmol/L — ABNORMAL LOW (ref 135–145)

## 2024-04-24 LAB — GLUCOSE, CAPILLARY
Glucose-Capillary: 109 mg/dL — ABNORMAL HIGH (ref 70–99)
Glucose-Capillary: 110 mg/dL — ABNORMAL HIGH (ref 70–99)
Glucose-Capillary: 121 mg/dL — ABNORMAL HIGH (ref 70–99)
Glucose-Capillary: 144 mg/dL — ABNORMAL HIGH (ref 70–99)
Glucose-Capillary: 155 mg/dL — ABNORMAL HIGH (ref 70–99)
Glucose-Capillary: 196 mg/dL — ABNORMAL HIGH (ref 70–99)
Glucose-Capillary: 251 mg/dL — ABNORMAL HIGH (ref 70–99)
Glucose-Capillary: 283 mg/dL — ABNORMAL HIGH (ref 70–99)
Glucose-Capillary: 87 mg/dL (ref 70–99)

## 2024-04-24 LAB — SURGICAL PATHOLOGY

## 2024-04-24 MED ORDER — VANCOMYCIN HCL 750 MG/150ML IV SOLN
750.0000 mg | Freq: Once | INTRAVENOUS | Status: AC
  Administered 2024-04-24: 750 mg via INTRAVENOUS
  Filled 2024-04-24: qty 150

## 2024-04-24 MED ORDER — APIXABAN 2.5 MG PO TABS: 2.5000 mg | ORAL_TABLET | Freq: Once | ORAL | Status: DC

## 2024-04-24 MED ORDER — VANCOMYCIN HCL 750 MG/150ML IV SOLN
750.0000 mg | INTRAVENOUS | Status: DC
  Administered 2024-04-27: 750 mg via INTRAVENOUS
  Filled 2024-04-24: qty 150

## 2024-04-24 MED ORDER — APIXABAN 5 MG PO TABS
5.0000 mg | ORAL_TABLET | Freq: Two times a day (BID) | ORAL | Status: DC
Start: 2024-04-24 — End: 2024-04-24

## 2024-04-24 MED ORDER — POTASSIUM CHLORIDE CRYS ER 20 MEQ PO TBCR
40.0000 meq | EXTENDED_RELEASE_TABLET | Freq: Once | ORAL | Status: AC
  Administered 2024-04-24: 40 meq via ORAL
  Filled 2024-04-24: qty 2

## 2024-04-24 MED ORDER — APIXABAN 5 MG PO TABS
10.0000 mg | ORAL_TABLET | Freq: Two times a day (BID) | ORAL | Status: DC
  Administered 2024-04-24 – 2024-04-28 (×7): 10 mg via ORAL
  Filled 2024-04-24 (×3): qty 2
  Filled 2024-04-24: qty 4
  Filled 2024-04-24 (×3): qty 2

## 2024-04-24 MED ORDER — APIXABAN 2.5 MG PO TABS
2.5000 mg | ORAL_TABLET | Freq: Two times a day (BID) | ORAL | Status: DC
Start: 2024-04-24 — End: 2024-04-24
  Administered 2024-04-24: 2.5 mg via ORAL
  Filled 2024-04-24: qty 1

## 2024-04-24 MED ORDER — VANCOMYCIN HCL 750 MG/150ML IV SOLN
750.0000 mg | INTRAVENOUS | Status: AC
  Administered 2024-04-25: 750 mg via INTRAVENOUS
  Filled 2024-04-24: qty 150

## 2024-04-24 MED ORDER — APIXABAN 5 MG PO TABS
7.5000 mg | ORAL_TABLET | Freq: Once | ORAL | Status: AC
  Administered 2024-04-24: 7.5 mg via ORAL
  Filled 2024-04-24: qty 1

## 2024-04-24 MED ORDER — HEPARIN SODIUM (PORCINE) 1000 UNIT/ML IJ SOLN
INTRAMUSCULAR | Status: AC
  Filled 2024-04-24: qty 4

## 2024-04-24 MED ORDER — ONDANSETRON HCL 4 MG/2ML IJ SOLN
4.0000 mg | Freq: Four times a day (QID) | INTRAMUSCULAR | Status: DC | PRN
  Administered 2024-04-24 – 2024-04-27 (×4): 4 mg via INTRAVENOUS
  Filled 2024-04-24 (×5): qty 2

## 2024-04-24 MED ORDER — INSULIN ASPART 100 UNIT/ML IJ SOLN: 0.0000 [IU] | Freq: Three times a day (TID) | INTRAMUSCULAR | Status: DC

## 2024-04-24 MED ORDER — APIXABAN 5 MG PO TABS: 5.0000 mg | ORAL_TABLET | Freq: Two times a day (BID) | ORAL | Status: DC

## 2024-04-24 MED ORDER — LATANOPROST 0.005 % OP SOLN
1.0000 [drp] | Freq: Every day | OPHTHALMIC | Status: DC
  Administered 2024-04-24 – 2024-04-27 (×4): 1 [drp] via OPHTHALMIC
  Filled 2024-04-24: qty 2.5

## 2024-04-24 NOTE — Progress Notes (Addendum)
 NAME:  [name], MRN:  [id number], DOB:  [birth year], LOS: 5 ADMISSION DATE:  04/19/2024, CONSULTATION DATE:  04/17/2024 REFERRING MD: EDP CHIEF COMPLAINT: Hypotension  History of Present Illness:  68 year old woman with end-stage renal disease on HD who initially presented with general weakness likely due to sepsis and also found to have osteomyelitis of the right fibula and septic arthritis with exposure of medullary bone on x-ray concerning for necrotizing fasciitis.  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   Pertinent  Medical History   Past Medical History:  Diagnosis Date   Anemia    Arthritis    Atrial fibrillation (HCC)    CAD (coronary artery disease)    Chronic kidney disease, stage III (moderate) (HCC)    Constipation    Diabetes mellitus (HCC)    History of blood transfusion    HLD (hyperlipidemia)    Hypertension    Myocardial infarction [facility])    in April 2014     Significant Hospital Events: Including procedures, antibiotic start and stop dates in addition to other pertinent events   7/27 : Admitted 7/28 : ABIs ordered, orthopedics consulted, planning for transtibial amputation 7/28 : Right BKA. +MRSA + Strept Agalactiae blood cultures  7/29 : TDC removed lab holiday 7/30 : Eliquis  held 7/31: TDC replaced 8/1: HD session today   Interim History / Subjective:  Patient is evaluated bedside, laying in bed, no apparent distress.  Reports that she is overall doing well compared to yesterday.  Reports that she had a dialysis session this morning, tolerated well.  Denies any lower extremity pain. RN at bedside reports erythema and edema at the R medial forearm, no TTP.   Objective   Blood pressure (!) 88/48, pulse 81, temperature 97.8 F (36.6 C), temperature source Oral, resp. rate 17, height 5' 7 (1.702 m), weight 73 kg, SpO2 97%.        Intake/Output Summary (Last 24 hours) at 04/24/2024 0855 Last data filed at 04/24/2024 0750 Gross per 24 hour  Intake 607.82 ml   Output 50 ml  Net 557.82 ml   Filed Weights   04/21/24 1254 04/23/24 0500 04/24/24 0645  Weight: 76 kg 71.6 kg 73 kg    Examination: General: Chronically ill-appearing, laying in bed in no acute distress HENT: Right IJ tunneled catheter in place Lungs: On room air, lungs are clear to auscultation bilaterally Cardiovascular: Regular rate and rhythm Abdomen: Colostomy bag  Extremities: Warm and dry Neuro: Alert, responding to questions appropriately, oriented to self, location and event  MSK : Right BKA site looks clean/dry and intact   Resolved Hospital Problem list   N/A  Assessment & Plan:  Septic shock MRSA/Group B Strep Bacteremia/necrotizing fasciitis of the RLE Presented w/ septic shock, was found to have MRSA with group B strep bacteremia with addition of necrotizing fasciitis of the right lower extremity, where she had hardware.  TTE reassuring, negative for vegetation. 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  HD session this  morning, tolerated well -Eliquis  held on 7/30, resumed today 8/1 -Midodrine  10 mg TID  Type 2 diabetes, with hyperglycemia CGM ~120-140s  Normocytic Anemia Anemia of chronic Kidney Disease Thrombocytopenia Hgb 7.5 (7.7), PLT 136 (194) - Received Mircera on 7/25  - Transfuse if Hgb < 7   Best Practice (right click and Reselect all SmartList Selections daily)   Diet/type: Regular diet DVT  prophylaxis: Eliquis  started today GI prophylaxis: N/A Lines: Central line Foley:  N/A Code Status:  DNR Last date of multidisciplinary goals of care discussion : NONE  Labs   CBC: Recent Labs  Lab 04/19/24 1341 04/20/24 0340 04/21/24 0520 04/22/24 0500 04/23/24 0852  WBC 28.5* 30.2* 19.1* 17.8* 16.4*  NEUTROABS 27.4*  --   --   --   --   HGB 9.4* 9.1* 7.8* 7.7* 7.5*  HCT 29.2* 27.5* 23.7* 23.7* 23.5*  MCV 82.7 79.5* 78.7* 79.5* 80.2  PLT 225 244 156 194 136*    Basic Metabolic Panel: Recent Labs  Lab 04/20/24 0340 04/21/24 0520 04/22/24 0500 04/23/24 0852 04/24/24 0656  NA 128* 128* 126* 125* 129*  K 3.3* 3.4* 3.4* 3.2* 3.1*  CL 92* 95* 94* 95* 95*  CO2 21* 22 21* 20* 25  GLUCOSE 374* 124* 142* 62* 133*  BUN 55* 58* 74* 85* 35*  CREATININE 6.89* 6.86* 7.33* 8.26* 4.21*  CALCIUM  8.1* 8.0* 7.6* 7.2* 7.1*  MG 1.9 2.0  --   --   --   PHOS 3.9 4.2 4.3  --   --    GFR: Estimated Creatinine Clearance: 12.4 mL/min (A) (by C-G formula based on SCr of 4.21 mg/dL (H)). Recent Labs  Lab 04/19/24 1409 04/19/24 2016 04/20/24 0340 04/21/24 0520 04/22/24 0500 04/23/24 0852  WBC  --   --  30.2* 19.1* 17.8* 16.4*  LATICACIDVEN 2.6* 1.5  --   --   --   --     Liver Function Tests: Recent Labs  Lab 04/19/24 1341 04/22/24 0500  AST 13*  --   ALT 12  --   ALKPHOS 106  --   BILITOT 2.0*  --   PROT 5.4*  --   ALBUMIN  1.5* <1.5*   No results for input(s): LIPASE, AMYLASE in the last 168 hours. No results for input(s): AMMONIA in the last 168 hours.  ABG    Component Value Date/Time   HCO3 29.4 (H) 08/01/2010 0805   TCO2 21 01/08/2013 1242   ACIDBASEDEF 7.0 (H) 06/14/2009 1616   O2SAT 59.0 08/01/2010 0805     Coagulation Profile: Recent Labs  Lab 04/19/24 1341  INR 1.1    Cardiac Enzymes: No results for input(s): CKTOTAL, CKMB, CKMBINDEX, TROPONINI in the last 168 hours.  HbA1C: HbA1c, POC (controlled diabetic range)  Date/Time Value  Ref Range Status  02/28/2021 02:50 PM 6.4 0.0 - 7.0 % Final  06/16/2020 01:52 PM 6.6 0.0 - 7.0 % Final   Hgb A1c MFr Bld  Date/Time Value Ref Range Status  10/28/2023 05:22 AM 6.6 (H) 4.8 - 5.6 % Final    Comment:    (NOTE) Pre diabetes:          5.7%-6.4%  Diabetes:              >6.4%  Glycemic control for   <7.0% adults with diabetes     CBG: Recent Labs  Lab 04/24/24 0027 04/24/24 0217 04/24/24 0447 04/24/24 0614 04/24/24 0737  GLUCAP 121* 110* 87 109* 144*    Review of Systems:  Endorses generalized weakness.  Denies any chest pain or shortness of breath  Past Medical History:  She,  has a past medical history of Anemia, Arthritis, Atrial fibrillation (HCC), CAD (coronary artery disease), Chronic kidney disease, stage III (moderate) (HCC), Constipation, Diabetes mellitus (HCC), History of blood transfusion, HLD (hyperlipidemia), Hypertension, and Myocardial infarction (HCC).   Surgical History:   Past Surgical History:  Procedure Laterality Date   AMPUTATION Right 04/20/2024   Procedure: AMPUTATION BELOW KNEE;  Surgeon: Harden Jerona GAILS, MD;  Location: [facility];  Service: Orthopedics;  Laterality: Right;   ANKLE SURGERY Right    x 6 total   BREAST BIOPSY Left    15 or 20 yearsa ago she cant remember laterality    HARDWARE REMOVAL Right 02/13/2013   Procedure: HARDWARE REMOVAL;  Surgeon: Jerona GAILS Harden, MD;  Location: [facility] OR;  Service: Orthopedics;  Laterality: Right;  Right Ankle Removal Hardware, Debridement, Place Wound VAC   IR FLUORO GUIDE CV LINE LEFT  11/08/2023   IR FLUORO GUIDE CV LINE LEFT  04/23/2024   IR FLUORO GUIDE CV LINE RIGHT  11/01/2023   IR FLUORO GUIDE CV LINE RIGHT  11/12/2023   IR REMOVAL TUN CV CATH W/O FL  04/21/2024   IR US  GUIDE VASC ACCESS LEFT  11/08/2023   IR US  GUIDE VASC ACCESS LEFT  04/23/2024   IR US  GUIDE VASC ACCESS RIGHT  11/01/2023   IR US  GUIDE VASC ACCESS RIGHT  11/12/2023   laser surgery for diabetic retinopathy Bilateral    LEFT HEART  CATHETERIZATION WITH CORONARY ANGIOGRAM N/A 01/09/2013   Procedure: LEFT HEART CATHETERIZATION WITH CORONARY ANGIOGRAM;  Surgeon: Toribio JONELLE Fuel, MD;  Location: [facility];  Service: Cardiovascular;  Laterality: N/A;   ORIF ANKLE FRACTURE Right 01/12/2013   Procedure: OPEN REDUCTION INTERNAL FIXATION (ORIF) ANKLE FRACTURE;  Surgeon: Jerona GAILS Harden, MD;  Location: [facility] OR;  Service: Orthopedics;  Laterality: Right;   PARTIAL COLECTOMY N/A 11/02/2023   Procedure: OPEN PARTIAL COLECTOMY WITH END COLOSTOMY;  Surgeon: Dasie Leonor CROME, MD;  Location[facility];  Service: General;  Laterality: N/A;   PARTIAL HYSTERECTOMY     TUNNELLED CATHETER EXCHANGE N/A 04/15/2024   Procedure: TUNNELLED CATHETER EXCHANGE;  Surgeon: Pearline Norman RAMAN, MD;  Location: [facility];  Service: Cardiovascular;  Laterality: N/A;     Social History:   reports that she has never smoked. She has never used smokeless tobacco. She reports that she does not drink alcohol  and does not use drugs.   Family History:  Her family history includes Cervical cancer in her sister; Colon cancer (age of onset: 40) in her mother; Congestive Heart Failure in her sister; Diabetes in her brother, mother, and sister; Heart disease in her father and maternal grandmother; Hypertension in her father; Kidney disease in her brother; Sickle cell anemia in her son; Thyroid  disease in her son. There is no history of Breast cancer or BRCA 1/2.   Allergies Allergies  Allergen Reactions   Nsaids Other (See Comments)    CKD stage 4   Lisinopril Other (See Comments)    coughing   Peanut-Containing Drug Products Itching and Other (See Comments)    GI intolerance - diarrhea     Home Medications  Prior to Admission medications   Medication Sig Start Date End Date Taking? Authorizing Provider  albuterol  (PROVENTIL ) (2.5 MG/3ML) 0.083% nebulizer solution Take 3 mLs (2.5 mg total) by nebulization every 2 (two) hours as needed for wheezing. 11/25/23  Yes  Ghimire,  Donalda HERO, MD  apixaban  (ELIQUIS ) 2.5 MG TABS tablet Take 1 tablet (2.5 mg total) by mouth 2 (two) times daily. 10/04/22  Yes Acharya, Gayatri A, MD  brimonidine  (ALPHAGAN ) 0.2 % ophthalmic solution Place 1 drop into both eyes 2 (two) times daily.   Yes [provider]  calcitRIOL  (ROCALTROL ) 0.25 MCG capsule TAKE 1 CAPSULE (0.25 MCG TOTAL) EVERY MONDAY, WEDNESDAY, AND FRIDAY Patient taking differently: Take 0.25 mcg by mouth in the morning and at bedtime. 06/22/22  Yes Christia Budds, MD  carvedilol  (COREG ) 12.5 MG tablet TAKE 1 TABLET (12.5 MG TOTAL) BY MOUTH 2 (TWO) TIMES DAILY WITH A MEAL. 12/22/21  Yes Autry-Lott, Rojean, DO  insulin  glargine (LANTUS ) 100 UNIT/ML injection Inject 0.07 mLs (7 Units total) into the skin daily. Please schedule appointment before next refill. Patient taking differently: Inject 10 Units into the skin daily. Please schedule appointment before next refill. 02/01/24  Yes [name], MD  latanoprost  (XALATAN ) 0.005 % ophthalmic solution Place 1 drop into both eyes at bedtime.   Yes [provider]  melatonin 5 MG TABS Take 10 mg by mouth at bedtime as needed (for insomnia).   Yes [provider]  midodrine  (PROAMATINE ) 2.5 MG tablet Take 2.5 mg by mouth.   Yes [provider]  Multiple Vitamin (MULTIVITAMIN WITH MINERALS) TABS Take 1 tablet by mouth daily.   Yes [provider]  NOVOLIN R 100 UNIT/ML injection Inject 0-9 Units into the skin in the morning, at noon, and at bedtime. Per sliding scale 04/03/24  Yes [provider]  ACCU-CHEK AVIVA PLUS test strip CHECK BLOOD GLUCOSE THREE TIMES DAILY 11/05/22   Joshua Domino, DO  amLODipine  (NORVASC ) 5 MG tablet Take 0.5 tablets (2.5 mg total) by mouth at bedtime. Patient not taking: Reported on 03/26/2024 02/01/24 03/26/24  [name], MD  Blood Glucose Monitoring Suppl (ACCU-CHEK AVIVA PLUS) w/Device KIT CHECK BLOOD GLUCOSE THREE TIMES DAILY 10/26/20   Autry-Lott,  Rojean, DO  collagenase  (SANTYL ) 250 UNIT/GM ointment Apply 1 Application topically daily. Apply nickel thick amount to ulcer Right lateral ankle 3 x 2 cm with muscle/fascia capsule exposed Patient not taking: Reported on 04/19/2024 04/15/24 05/15/24  Lamount Ethan CROME, DPM  DROPLET INSULIN  SYRINGE 31G X 5/16 0.5 ML MISC USE FOUR TIMES DAILY FOR INJECTIONS 09/03/22   Joshua Domino, DO  furosemide  (LASIX ) 40 MG tablet Take 40 mg by mouth 2 (two) times daily. Patient not taking: Reported on 04/19/2024    [provider]  insulin  aspart (NOVOLOG  FLEXPEN) 100 UNIT/ML FlexPen 0-9 Units, Subcutaneous, 3 times daily with meals CBG < 70: Implement Hypoglycemia measures CBG 70 - 120: 0 units CBG 121 - 150: 1 unit CBG 151 - 200: 2 units CBG 201 - 250: 3 units CBG 251 - 300: 5 units CBG 301 - 350: 7 units CBG 351 - 400: 9 units CBG > 400: call MD Patient not taking: Reported on 04/19/2024 11/25/23   Raenelle Donalda HERO, MD  Insulin  Syringes, Disposable, U-100 0.5 ML MISC 4x daily injections 03/21/18   Riccio, Angela C, DO  Lancet Devices (ACCU-CHEK SOFTCLIX) lancets Fill for 1 month for TID testing. Use as instructed 11/13/13   Curtis Hadassah DASEN, MD  NEEDLE, DISP, 30 G (BD DISP NEEDLES) 30G X 1/2 MISC For 4x daily injections 11/05/15   Phelps, Jazma Y, DO  REPATHA SURECLICK 140 MG/ML SOAJ Inject 140 mg into the skin every 14 (fourteen) days. Patient not taking: Reported on 03/26/2024  09/16/23   [provider]  VITAMIN D , CHOLECALCIFEROL, PO Take 1 tablet by mouth daily. Patient not taking: Reported on 04/19/2024    [provider]     Critical care time: 40 minutes

## 2024-04-24 NOTE — Plan of Care (Signed)
  Problem: Clinical Measurements: Goal: Ability to maintain clinical measurements within normal limits will improve Outcome: Progressing Goal: Diagnostic test results will improve Outcome: Progressing   

## 2024-04-24 NOTE — Progress Notes (Signed)
 Occupational Therapy Treatment Patient Details Name: [name] MRN: [id number] DOB: 06-Jul-1956 Today's Date: 04/24/2024   History of present illness 68 yo female adm 04/19/24 with sepsis, hypotension and Rt food wound. 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 PMHx: ESRD on HD, colostomy, T2DM, Afib, Rt ankle ORIF, CAD   OT comments  Patient received in supine and stated fatigue from HD earlier but agreeable to OT/PT treatment and OOB for breakfast. Patient demonstrating gains with bed mobility with mod assist but required mod assist +2 for transfer to recliner due to fatigue. Patient provided setup for breakfast and required assistance with cutting up food. Patient will benefit from intensive inpatient follow-up therapy, >3 hours/day.  Acute OT to continue to follow to address established goals to facilitate DC to next venue of care.        If plan is discharge home, recommend the following:  Two people to help with walking and/or transfers;A lot of help with bathing/dressing/bathroom;Two people to help with bathing/dressing/bathroom;Assistance with cooking/housework;Assist for transportation;Help with stairs or ramp for entrance   Equipment Recommendations  Other (comment) (defer)    Recommendations for Other Services      Precautions / Restrictions Precautions Precautions: Fall;Other (comment) Precaution/Restrictions Comments: hypoglycemic events, watch BP (hypotensive), colostomy Restrictions Weight Bearing Restrictions Per Provider Order: Yes RLE Weight Bearing Per Provider Order: Non weight bearing       Mobility Bed Mobility Overal bed mobility: Needs Assistance Bed Mobility: Supine to Sit     Supine to sit: Mod assist, HOB elevated, Used rails     General bed mobility comments: increased time    Transfers Overall transfer level: Needs assistance Equipment used: None Transfers: Bed to chair/wheelchair/BSC Sit to Stand: +2 physical assistance, Mod assist           General transfer  comment: squat pivot transfer due to patient complaints of fatigue from HD earlier     Balance Overall balance assessment: Needs assistance Sitting-balance support: Bilateral upper extremity supported, Feet supported Sitting balance-Leahy Scale: Fair                                     ADL either performed or assessed with clinical judgement   ADL Overall ADL's : Needs assistance/impaired Eating/Feeding: Set up;Sitting Eating/Feeding Details (indicate cue type and reason): required assistance with opening containers and cutting up food Grooming: Wash/dry hands;Wash/dry face;Set up;Sitting                   Toilet Transfer: Moderate assistance;+2 for physical assistance;Squat-pivot Toilet Transfer Details (indicate cue type and reason): simulated to chair           General ADL Comments: patient stating fatigue from HD earlier today and performed squat pivot transfer to recliner for increased safety    Extremity/Trunk Assessment Upper Extremity Assessment Upper Extremity Assessment: Generalized weakness            Vision       Perception     Praxis     Communication Communication Communication: No apparent difficulties   Cognition Arousal: Alert Behavior During Therapy: Flat affect Cognition: Cognition impaired     Awareness: Intellectual awareness intact, Online awareness impaired   Attention impairment (select first level of impairment): Selective attention Executive functioning impairment (select all impairments): Problem solving                   Following commands: Impaired Following commands impaired:  Follows one step commands with increased time      Cueing   Cueing Techniques: Verbal cues, Gestural cues, Tactile cues  Exercises      Shoulder Instructions       General Comments VSS on RA with BP 88/48 (57)    Pertinent Vitals/ Pain       Pain Assessment Pain Assessment: Faces Faces Pain Scale: Hurts little  more Pain Location: generalized Pain Descriptors / Indicators: Discomfort, Grimacing, Guarding Pain Intervention(s): Monitored during session, Premedicated before session, Repositioned  Home Living                                          Prior Functioning/Environment              Frequency  Min 2X/week        Progress Toward Goals  OT Goals(current goals can now be found in the care plan section)  Progress towards OT goals: Progressing toward goals  Acute Rehab OT Goals Patient Stated Goal: get better OT Goal Formulation: With patient Time For Goal Achievement: 05/05/24 Potential to Achieve Goals: Good ADL Goals Pt Will Perform Lower Body Dressing: with min assist;sitting/lateral leans Pt Will Transfer to Toilet: with min assist;with transfer board Pt Will Perform Toileting - Clothing Manipulation and hygiene: with contact guard assist;sitting/lateral leans Additional ADL Goal #1: pt will identify and implement strategies for desensitization to R residual limb Additional ADL Goal #2: pt will engage in dynamic reaching tasks outside BOS in sitting to optimize sitting balance.  Plan      Co-evaluation    PT/OT/SLP Co-Evaluation/Treatment: Yes Reason for Co-Treatment: To address functional/ADL transfers;For patient/therapist safety PT goals addressed during session: Mobility/safety with mobility;Balance OT goals addressed during session: ADL's and self-care      AM-PAC OT 6 Clicks Daily Activity     Outcome Measure   Help from another person eating meals?: A Little Help from another person taking care of personal grooming?: A Little Help from another person toileting, which includes using toliet, bedpan, or urinal?: A Lot Help from another person bathing (including washing, rinsing, drying)?: A Lot Help from another person to put on and taking off regular upper body clothing?: A Little Help from another person to put on and taking off regular  lower body clothing?: A Lot 6 Click Score: 15    End of Session Equipment Utilized During Treatment: Gait belt  OT Visit Diagnosis: Unsteadiness on feet (R26.81);Muscle weakness (generalized) (M62.81);Pain;Other symptoms and signs involving cognitive function Pain - part of body:  (generalized)   Activity Tolerance Patient tolerated treatment well   Patient Left in chair;with call bell/phone within reach;with chair alarm set   Nurse Communication Mobility status        Time: 9254-9191 OT Time Calculation (min): 23 min  Charges: OT General Charges $OT Visit: 1 Visit OT Treatments $Self Care/Home Management : 8-22 mins  Dick Laine, OTA Acute Rehabilitation Services  Office [phone]   Jeb LITTIE Laine 04/24/2024, 12:35 PM

## 2024-04-24 NOTE — Progress Notes (Signed)
 Nephrology Follow-Up Consult note  Outpatient dialysis unit: [facility] prescription: F180, 350 BFR, EDW 69.5 (weight was 67.4 post HD on 7/25), 4hrs, 3K, 2.5 Ca, RIJ TDC, mircera 100mcg given 7/25, was on IV iron , calcitriol  0.25mcg on home meds list (unclear if taking, no binders   Assessment/Recommendations: [name] is a/an 68 y.o. female with a past medical history notable for ESRD on HD admitted with sepsis c/b shock   # ESRD: Dialysis on MWF schedule outpatient.  Dialysis delayed due to multiple issues then received on early Friday morning.  Plan for dialysis again Saturday and then return to MWF schedule   # Volume/ hypertension: Continue with ultrafiltration as tolerated on dialysis.  At this time limited by hypotension.  She appears overloaded on exam   # Anemia of Chronic Kidney Disease: Hemoglobin below goal in the sevens.  Received Mircera on 7/25.  Hold IV iron  for now.  Transfusion if needed   # Secondary Hyperparathyroidism/Hyperphosphatemia: Corrected calcium  normal.  Albumin  very low associated with infection.  Not on any binders but phosphorus at goal   # Vascular access: TDC nonfunctional on 7/29.  Had a line holiday because of bacteremia.  Replaced on 7/31.  Appreciate help from IR   # Septic shock: Antibiotics per primary team.  Shock improved.  No pressor requirement at this time   # Osteomyelitis/necrotizing fasciitis/MRSA bacteremia: Status post surgery with amputation on 7/28.  Also had line holiday.  Antibiotics per primary team     # Additional recommendations: - Dose all meds for creatinine clearance < 10 ml/min  - Unless absolutely necessary, no MRIs with gadolinium.  - Implement save arm precautions.  Prefer needle sticks in the dorsum of the hands or wrists.  No blood pressure measurements in arm. - If blood transfusion is requested during hemodialysis sessions, please alert us  prior to the session.  - Use synthetic opioids  (Fentanyl /Dilaudid ) if needed   Recommendations were discussed with the primary team.   Recommendations conveyed to primary service.    [name] [facility] 04/24/2024 9:26 AM  ___________________________________________________________  CC: Weakness  Interval History/Subjective: Patient states she feels okay today.  Tolerated dialysis yesterday reasonably well but appears to have had minimal ultrafiltration.  Continues to have low blood pressures but not on pressors  Medications:  Current Facility-Administered Medications  Medication Dose Route Frequency Provider Last Rate Last Admin   acetaminophen  (TYLENOL ) tablet 650 mg  650 mg Oral Q6H PRN Amoako, Prince, MD   650 mg at 04/21/24 1341   apixaban  (ELIQUIS ) tablet 2.5 mg  2.5 mg Oral BID Tawkaliyar, Roya, DO   2.5 mg at 04/24/24 9081   ascorbic acid  (VITAMIN C ) tablet 1,000 mg  1,000 mg Oral Daily Harden Jerona GAILS, MD   1,000 mg at 04/24/24 9081   brimonidine  (ALPHAGAN ) 0.2 % ophthalmic solution 1 drop  1 drop Both Eyes BID Paliwal, Aditya, MD   1 drop at 04/24/24 0919   Chlorhexidine  Gluconate Cloth 2 % PADS 6 each  6 each Topical Q0600 Harden Jerona GAILS, MD   6 each at 04/23/24 0000   docusate sodium  (COLACE) capsule 100 mg  100 mg Oral BID PRN Harden Jerona GAILS, MD       fentaNYL  (SUBLIMAZE ) injection 12.5 mcg  12.5 mcg Intravenous Q4H PRN Renne Homans, MD   12.5 mcg at 04/22/24 2041   heparin  injection 1,000 Units  1,000 Units Dialysis PRN Player Jayson JINNY, MD  latanoprost  (XALATAN ) 0.005 % ophthalmic solution 1 drop  1 drop Both Eyes QHS Olalere, Adewale A, MD       lidocaine  (PF) (XYLOCAINE ) 1 % injection 5 mL  5 mL Intradermal PRN Macel Jayson PARAS, MD       lidocaine -prilocaine  (EMLA ) cream 1 Application  1 Application Topical PRN Macel Jayson PARAS, MD       midodrine  (PROAMATINE ) tablet 10 mg  10 mg Oral TID WC Harden Jerona GAILS, MD   10 mg at 04/24/24 0748   norepinephrine  (LEVOPHED ) 4mg  in (0.016  mg/mL) premix infusion  0-10 mcg/min Intravenous Continuous Merilee Linsey I, [facility]   Stopped at 04/22/24 1649   nutrition supplement (JUVEN) (JUVEN) powder packet 1 packet  1 packet Oral BID BM Duda, Marcus V, MD   1 packet at 04/24/24 [id number]   Oral care mouth rinse  15 mL Mouth Rinse PRN Harden Jerona GAILS, MD       oxyCODONE  (Oxy IR/ROXICODONE ) immediate release tablet 5 mg  5 mg Oral Q6H PRN Renne Homans, MD   5 mg at 04/23/24 1837   pentafluoroprop-tetrafluoroeth (GEBAUERS) aerosol 1 Application  1 Application Topical PRN Macel Jayson PARAS, MD       polyethylene glycol (MIRALAX  / GLYCOLAX ) packet 17 g  17 g Oral Daily PRN Harden Jerona GAILS, MD       potassium chloride  SA (KLOR-CON  M) CR tablet 40 mEq  40 mEq Oral Once Macel Jayson PARAS, MD       trimethobenzamide  (TIGAN ) injection 200 mg  200 mg Intramuscular Q6H PRN Renne Homans, MD   200 mg at 04/23/24 1841   vancomycin  variable dose per unstable renal function (pharmacist dosing)   Does not apply See admin instructions Merilee Linsey I, [facility]       zinc  sulfate (50mg  elemental zinc ) capsule 220 mg  220 mg Oral Daily Harden Jerona GAILS, MD   220 mg at 04/24/24 9081      Review of Systems: 10 systems reviewed and negative except per interval history/subjective  Physical Exam: Vitals:   04/24/24 0700 04/24/24 0800  BP: (!) 90/37 (!) 88/48  Pulse: 76 81  Resp: 13 17  Temp:    SpO2: 99% 97%   Total I/O In: -  Out: 50 [Stool:50]  Intake/Output Summary (Last 24 hours) at 04/24/2024 9073 Last data filed at 04/24/2024 0750 Gross per 24 hour  Intake 557.85 ml  Output 50 ml  Net 507.85 ml   Constitutional: Tired, sitting in chair, no distress ENMT: ears and nose without scars or lesions, MMM CV: normal rate, 1+ edema in the bilateral upper extremities Respiratory: Bilateral chest rise, normal work of breathing Gastrointestinal: soft, non-tender, no palpable masses or hernias Skin: no visible lesions or rashes Extremities: Right lower  extremity absent below the knee, wound covered Psych: alert, judgement/insight appropriate, appropriate mood and affect   Test Results I personally reviewed new and old clinical labs and radiology tests Lab Results  Component Value Date   NA 129 (L) 04/24/2024   K 3.1 (L) 04/24/2024   CL 95 (L) 04/24/2024   CO2 25 04/24/2024   BUN 35 (H) 04/24/2024   CREATININE 4.21 (H) 04/24/2024   GFR 17.34 (L) 04/12/2021   CALCIUM  7.1 (L) 04/24/2024   ALBUMIN  <1.5 (L) 04/22/2024   PHOS 4.3 04/22/2024    CBC Recent Labs  Lab 04/19/24 1341 04/20/24 0340 04/21/24 0520 04/22/24 0500 04/23/24 0852  WBC 28.5*   < > 19.1* 17.8* 16.4*  NEUTROABS 27.4*  --   --   --   --   HGB 9.4*   < > 7.8* 7.7* 7.5*  HCT 29.2*   < > 23.7* 23.7* 23.5*  MCV 82.7   < > 78.7* 79.5* 80.2  PLT 225   < > 156 194 136*   < > = values in this interval not displayed.

## 2024-04-24 NOTE — Progress Notes (Addendum)
 Right internal jugular clot  Will treat as a deep vein thrombosis  Full dose Eliquis  10 mg twice daily

## 2024-04-24 NOTE — Progress Notes (Signed)
 Upper ext venous  has been completed. Refer to [facility] under chart review to view preliminary results.   04/24/2024  2:07 PM Tylee Newby, Ricka BIRCH

## 2024-04-24 NOTE — Progress Notes (Signed)
 Pharmacy Antibiotic Note  [name] is a 68 y.o. female admitted on 04/19/2024 with bacteremia.  Pharmacy has been consulted for Vancomycin  dosing. Echo shows no vegetation, MRI neg for osteo (although early). Planning for 6wks abx from negative cultures.   Received loading dose of Vancomycin  1500 mg on 7/27 and 750mg  IV x1 on 7/29. VR on 7/31 = 31mcg/mL (therapeutic)  Patient had HD early this AM (8/1) - filtered, no volume removed.  Plan for HD again Saturday then resume MWF HD schedule.   Plan: Vancomycin  750 mg IV x1 now since HD this AM.   Vancomycin  750mg  IV post HD on Saturday x1 dose on 8/2.  Then resume Vancomycin  750mg  post HD MWF starting on Monday 8/4.  Monitor vanc levels PRN per protocol.  Height: 5' 7 (170.2 cm) Weight: 73 kg (160 lb 15 oz) IBW/kg (Calculated) : 61.6  Temp (24hrs), Avg:96.7 F (35.9 C), Min:94.5 F (34.7 C), Max:97.8 F (36.6 C)  Recent Labs  Lab 04/19/24 1341 04/19/24 1409 04/19/24 2016 04/20/24 0340 04/21/24 0520 04/22/24 0500 04/23/24 0546 04/23/24 0852 04/24/24 0656  WBC 28.5*  --   --  30.2* 19.1* 17.8*  --  16.4*  --   CREATININE 7.17*  --  6.96* 6.89* 6.86* 7.33*  --  8.26* 4.21*  LATICACIDVEN  --  2.6* 1.5  --   --   --   --   --   --   VANCORANDOM  --   --   --   --  15  --  20  --   --     Estimated Creatinine Clearance: 12.4 mL/min (A) (by C-G formula based on SCr of 4.21 mg/dL (H)).    Allergies  Allergen Reactions   Nsaids Other (See Comments)    CKD stage 4   Lisinopril Other (See Comments)    coughing   Peanut-Containing Drug Products Itching and Other (See Comments)    GI intolerance - diarrhea    Antimicrobials this admission: Clindamycin  7/27 >> 7/28 Cefepime  7/27 >> 7/28 Vancomycin  7/27 >  Dose adjustments this admission: N/A  Microbiology results: 7/27 MRSA neg 7/27 RVP neg  7/27 BCX 2/4 BCX MRSA, GBS  7/28 Leg cx NGTDx2 7/29 Bcx NGTDx1   Thank you for allowing pharmacy to be a part of  this patient's care.  Harlene Boga, PharmD, BCPS, BCCCP Please refer to [facility] for [facility] Pharmacy numbers 04/24/2024 12:39 PM

## 2024-04-24 NOTE — Progress Notes (Signed)
 Physical Therapy Treatment Patient Details Name: [name] MRN: [id number] DOB: January 04, 1956 Today's Date: 04/24/2024   History of Present Illness 68 yo female adm 04/19/24 with sepsis, hypotension and Rt food wound. 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 PMHx: ESRD on HD, colostomy, T2DM, Afib, Rt ankle ORIF, CAD    PT Comments  Pt fatigued due to completing HD just prior to therapy session. Seen with OT to progress OOB. Pt required mod assist bed mobility, and +2 mod pivot transfer bed to recliner. Pt in recliner with feet elevated at end of session.     If plan is discharge home, recommend the following: A lot of help with walking and/or transfers;A lot of help with bathing/dressing/bathroom;Help with stairs or ramp for entrance;Assist for transportation;Assistance with cooking/housework   Can travel by private vehicle     No  Equipment Recommendations  Wheelchair (measurements PT);Wheelchair cushion (measurements PT);Rolling walker (2 wheels);BSC/3in1 (drop-arm BSC)    Recommendations for Other Services       Precautions / Restrictions Precautions Precautions: Fall;Other (comment) Precaution/Restrictions Comments: hypoglycemic events, watch BP (hypotensive), colostomy Restrictions RLE Weight Bearing Per Provider Order: Non weight bearing     Mobility  Bed Mobility Overal bed mobility: Needs Assistance Bed Mobility: Supine to Sit     Supine to sit: Mod assist, HOB elevated, Used rails     General bed mobility comments: increased time    Transfers Overall transfer level: Needs assistance Equipment used: None Transfers: Bed to chair/wheelchair/BSC Sit to Stand: +2 physical assistance, Mod assist           General transfer comment: bed to recliner toward the L    Ambulation/Gait                   Stairs             Wheelchair Mobility     Tilt Bed    Modified Rankin (Stroke Patients Only)       Balance Overall balance assessment: Needs  assistance Sitting-balance support: Bilateral upper extremity supported, Feet supported Sitting balance-Leahy Scale: Fair                                      Hotel manager: No apparent difficulties  Cognition Arousal: Alert Behavior During Therapy: Flat affect   PT - Cognitive impairments: No family/caregiver present to determine baseline, Initiation, Sequencing, Problem solving                         Following commands: Impaired Following commands impaired: Follows one step commands with increased time    Cueing Cueing Techniques: Verbal cues, Gestural cues, Tactile cues  Exercises General Exercises - Lower Extremity Ankle Circles/Pumps: AROM, Left, 10 reps Hip ABduction/ADduction: AROM, Left, 5 reps    General Comments General comments (skin integrity, edema, etc.): VSS on RA. Soft BP in bed. Improved with meds and transfer to recliner.      Pertinent Vitals/Pain Pain Assessment Pain Assessment: Faces Faces Pain Scale: Hurts little more Pain Location: generalized Pain Descriptors / Indicators: Discomfort, Grimacing, Guarding Pain Intervention(s): Repositioned, Limited activity within patient's tolerance    Home Living                          Prior Function            PT Goals (current goals can  now be found in the care plan section) Acute Rehab PT Goals Patient Stated Goal: home Progress towards PT goals: Progressing toward goals    Frequency    Min 3X/week      PT Plan      Co-evaluation PT/OT/SLP Co-Evaluation/Treatment: Yes Reason for Co-Treatment: To address functional/ADL transfers;For patient/therapist safety PT goals addressed during session: Mobility/safety with mobility;Balance        AM-PAC PT 6 Clicks Mobility   Outcome Measure  Help needed turning from your back to your side while in a flat bed without using bedrails?: A Little Help needed moving from lying on your  back to sitting on the side of a flat bed without using bedrails?: A Lot Help needed moving to and from a bed to a chair (including a wheelchair)?: Total Help needed standing up from a chair using your arms (e.g., wheelchair or bedside chair)?: Total Help needed to walk in hospital room?: Total Help needed climbing 3-5 steps with a railing? : Total 6 Click Score: 9    End of Session Equipment Utilized During Treatment: Gait belt Activity Tolerance: Patient limited by fatigue Patient left: in chair;with call bell/phone within reach;with chair alarm set Nurse Communication: Mobility status PT Visit Diagnosis: Difficulty in walking, not elsewhere classified (R26.2);Unsteadiness on feet (R26.81);Muscle weakness (generalized) (M62.81)     Time: 9254-9191 PT Time Calculation (min) (ACUTE ONLY): 23 min  Charges:    $Therapeutic Activity: 8-22 mins PT General Charges $$ ACUTE PT VISIT: 1 Visit                     Sari MATSU., PT  Office # [phone]    Erven Sari Shaker 04/24/2024, 10:09 AM

## 2024-04-24 NOTE — TOC Progression Note (Addendum)
 Transition of Care [facility]) - Progression Note    Patient Details  Name: [name] MRN: [id number] Date of Birth: September 13, 1956  [facility] CM/SW Contact  Shontez Sermon A [place], LCSW Phone Number: 04/24/2024, 4:20 PM  Clinical Narrative:     CSW reached out to the following facilities regarding [facility] placement for SNF.   [facility]: [place]- Left VM with Rosina in admissions [place]- unable to be reached   [facility]: [place], left VM with Tiffany in admission, no answer.  [facility]- no answer, unable to leave VM  [facility]- unable to be reached [facility]- unable to be reached    CSW then reached out and spoke with pt, CSW informed her that there were no bed offers so far in the [place] area.   CSW provided other bed offers for [facility] again, she was agreeable for [facility] for placement.   CSW reached out to Tammy at [facility], informed her of bed acceptance and inquired about bed availability, waiting to hear back. Pt nearing medically stability, possibly start authorization in coming days.   [facility] MWF, 11am chair time, notified facility.   CSW will continue to follow.      Expected Discharge Plan: Skilled Nursing Facility Barriers to Discharge: Continued Medical Work up, Other (must enter comment), English as a second language teacher (SNF pending bed decision)               Expected Discharge Plan and Services In-house Referral: Clinical Social Work   Post Acute Care Choice: Skilled Nursing Facility Living arrangements for the past 2 months: Single Family Home                                       Social Drivers of Health (SDOH) Interventions SDOH Screenings   Food Insecurity: No Food Insecurity (04/19/2024)  Housing: Low Risk  (04/19/2024)  Transportation Needs: No Transportation Needs (04/19/2024)  Utilities: Not At Risk (04/19/2024)  Depression (PHQ2-9): Low Risk  (02/28/2021)  Financial  Resource Strain: Low Risk  (08/25/2021)   Received from [facility]  Physical Activity: Insufficiently Active (06/20/2021)   Received from [facility]  Social Connections: Socially Isolated (04/19/2024)  Stress: No Stress Concern Present (08/25/2021)   Received from [facility]  Tobacco Use: Low Risk  (03/26/2024)    Readmission Risk Interventions     No data to display

## 2024-04-24 NOTE — Progress Notes (Signed)
 VAST consult received to obtain peripheral IV access. Pt with right arm restricted d/t acute clot and swelling. Left arm assessed both with and without ultrasound; no appropriate veins available for IV placement (vessels too small/deep). ICU RN notified.

## 2024-04-25 DIAGNOSIS — R6521 Severe sepsis with septic shock: Secondary | ICD-10-CM | POA: Diagnosis not present

## 2024-04-25 DIAGNOSIS — N186 End stage renal disease: Secondary | ICD-10-CM | POA: Diagnosis not present

## 2024-04-25 DIAGNOSIS — A419 Sepsis, unspecified organism: Secondary | ICD-10-CM | POA: Diagnosis not present

## 2024-04-25 DIAGNOSIS — M726 Necrotizing fasciitis: Secondary | ICD-10-CM | POA: Diagnosis not present

## 2024-04-25 LAB — CULTURE, BLOOD (ROUTINE X 2)

## 2024-04-25 LAB — CBC
HCT: 23.3 % — ABNORMAL LOW (ref 36.0–46.0)
Hemoglobin: 7.4 g/dL — ABNORMAL LOW (ref 12.0–15.0)
MCH: 25.5 pg — ABNORMAL LOW (ref 26.0–34.0)
MCHC: 31.8 g/dL (ref 30.0–36.0)
MCV: 80.3 fL (ref 80.0–100.0)
Platelets: 83 K/uL — ABNORMAL LOW (ref 150–400)
RBC: 2.9 MIL/uL — ABNORMAL LOW (ref 3.87–5.11)
RDW: 19.1 % — ABNORMAL HIGH (ref 11.5–15.5)
WBC: 19.2 K/uL — ABNORMAL HIGH (ref 4.0–10.5)
nRBC: 0.3 % — ABNORMAL HIGH (ref 0.0–0.2)

## 2024-04-25 LAB — RENAL FUNCTION PANEL
Albumin: <1.5 g/dL — ABNORMAL LOW (ref 3.5–5.0)
Anion gap: 10 (ref 5–15)
BUN: 51 mg/dL — ABNORMAL HIGH (ref 8–23)
CO2: 23 mmol/L (ref 22–32)
Calcium: 6.9 mg/dL — ABNORMAL LOW (ref 8.9–10.3)
Chloride: 94 mmol/L — ABNORMAL LOW (ref 98–111)
Creatinine, Ser: 5.38 mg/dL — ABNORMAL HIGH (ref 0.44–1.00)
GFR, Estimated: 8 mL/min — ABNORMAL LOW (ref >60)
Glucose, Bld: 257 mg/dL — ABNORMAL HIGH (ref 70–99)
Phosphorus: 3.5 mg/dL (ref 2.5–4.6)
Potassium: 4.5 mmol/L (ref 3.5–5.1)
Sodium: 127 mmol/L — ABNORMAL LOW (ref 135–145)

## 2024-04-25 LAB — AEROBIC/ANAEROBIC CULTURE W GRAM STAIN (SURGICAL/DEEP WOUND)
Culture: NO GROWTH
Gram Stain: NONE SEEN

## 2024-04-25 LAB — BASIC METABOLIC PANEL WITH GFR
Anion gap: 12 (ref 5–15)
BUN: 50 mg/dL — ABNORMAL HIGH (ref 8–23)
CO2: 23 mmol/L (ref 22–32)
Calcium: 7.1 mg/dL — ABNORMAL LOW (ref 8.9–10.3)
Chloride: 94 mmol/L — ABNORMAL LOW (ref 98–111)
Creatinine, Ser: 5.42 mg/dL — ABNORMAL HIGH (ref 0.44–1.00)
GFR, Estimated: 8 mL/min — ABNORMAL LOW (ref >60)
Glucose, Bld: 262 mg/dL — ABNORMAL HIGH (ref 70–99)
Potassium: 4.6 mmol/L (ref 3.5–5.1)
Sodium: 129 mmol/L — ABNORMAL LOW (ref 135–145)

## 2024-04-25 LAB — MAGNESIUM: Magnesium: 1.8 mg/dL (ref 1.7–2.4)

## 2024-04-25 LAB — GLUCOSE, CAPILLARY
Glucose-Capillary: 293 mg/dL — ABNORMAL HIGH (ref 70–99)
Glucose-Capillary: 252 mg/dL — ABNORMAL HIGH (ref 70–99)
Glucose-Capillary: 226 mg/dL — ABNORMAL HIGH (ref 70–99)
Glucose-Capillary: 143 mg/dL — ABNORMAL HIGH (ref 70–99)

## 2024-04-25 MED ORDER — INSULIN ASPART 100 UNIT/ML IJ SOLN
2.0000 [IU] | Freq: Three times a day (TID) | INTRAMUSCULAR | Status: DC
  Administered 2024-04-25 – 2024-04-28 (×7): 2 [IU] via SUBCUTANEOUS

## 2024-04-25 MED ORDER — VANCOMYCIN HCL 750 MG/150ML IV SOLN
INTRAVENOUS | Status: AC
  Filled 2024-04-25: qty 150

## 2024-04-25 MED ORDER — INSULIN GLARGINE-YFGN 100 UNIT/ML ~~LOC~~ SOLN
5.0000 [IU] | Freq: Every day | SUBCUTANEOUS | Status: DC
  Administered 2024-04-25: 5 [IU] via SUBCUTANEOUS
  Filled 2024-04-25 (×2): qty 0.05

## 2024-04-25 MED ORDER — HEPARIN SODIUM (PORCINE) 1000 UNIT/ML IJ SOLN
INTRAMUSCULAR | Status: AC
  Filled 2024-04-25: qty 4

## 2024-04-25 MED ORDER — INSULIN ASPART 100 UNIT/ML IJ SOLN
0.0000 [IU] | Freq: Three times a day (TID) | INTRAMUSCULAR | Status: DC
  Administered 2024-04-25: 3 [IU] via SUBCUTANEOUS
  Administered 2024-04-25: 2 [IU] via SUBCUTANEOUS
  Administered 2024-04-26: 1 [IU] via SUBCUTANEOUS
  Administered 2024-04-27: 3 [IU] via SUBCUTANEOUS
  Administered 2024-04-28: 2 [IU] via SUBCUTANEOUS
  Administered 2024-04-28: 1 [IU] via SUBCUTANEOUS

## 2024-04-25 MED ORDER — CALCITRIOL 0.25 MCG PO CAPS
0.2500 ug | ORAL_CAPSULE | ORAL | Status: DC
  Administered 2024-04-27: 0.25 ug via ORAL
  Filled 2024-04-25: qty 1

## 2024-04-25 MED ORDER — PROSOURCE PLUS PO LIQD
30.0000 mL | Freq: Two times a day (BID) | ORAL | Status: DC
  Administered 2024-04-25 – 2024-04-28 (×4): 30 mL via ORAL
  Filled 2024-04-25 (×3): qty 30

## 2024-04-25 MED ORDER — INSULIN ASPART 100 UNIT/ML IJ SOLN
0.0000 [IU] | Freq: Every day | INTRAMUSCULAR | Status: DC
  Administered 2024-04-27: 2 [IU] via SUBCUTANEOUS

## 2024-04-25 NOTE — Progress Notes (Signed)
 [facility] PROGRESS NOTE    Progress Note  [name]  FMW:[id number] DOB: 1956-05-03 DOA: 04/19/2024 PCP: Campbell Reynolds, NP     Brief Narrative:   [name] is an 68 y.o. 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 +MRSA + Strept Agalactiae blood cultures  7/29 : TDC removed lab holiday 7/30 : Eliquis  held 7/31: TDC replaced 8/1: HD session today    Assessment/Plan:   Septic shock (HCC)/MRSA/group B streptococcus bacteremia in the setting of necrotizing fasciitis of the left lower extremity and right lower extremity cellulitis: Patient did not require pressors.  Status post line holiday 04/19/2024 blood cultures grew MRSA and streptococcal cacti sensitive to IV vancomycin . Orthopedic surgery was consulted and he status post right BKA on 04/20/2024. ID was consulted and the plan is to continue IV vancomycin  for 8 weeks with an end day of treatment of 06/15/2024, antibiotics to be given during dialysis. PT evaluated the patient will need skilled nursing facility placement.  End-stage renal disease on hemodialysis: Nephrology on board, TDC removed on 04/21/2024, replaced by IR on 04/23/2024. Eliquis  was resumed on 04/24/2024. Plan for HD on 04/25/2024.  Can return to his Monday Wednesday and Friday schedule. Continue midodrine  3 times a day  Uncontrolled diabetes mellitus type 2 with hyperglycemia: Only on sliding scale blood glucose trending up, started on long-acting insulin  and change sliding scale insulin .  History of paroxysmal atrial fibrillation: In sinus rhythm,  Coreg  held. Eliquis  full dose.  Right internal jugular with clotted: Started on treatment full dose for DVT. Doppler of the right lower extremity to rule out central line showed an acute DVT involving the right internal jugular  Normocytic anemia/anemia of chronic disease/thrombocytopenia: Platelets were slightly lower on 04/23/2020 from repeat CBC today.   Received IV iron  on 04/17/2024 Transfuse as needed if hemoglobin less than 7 or symptomatic.  Vascular access: TCD nonfunctioning discontinued and replaced by IR on 04/23/2024.   DVT prophylaxis: eliquis  Family Communication:none Status is: Inpatient Remains inpatient appropriate because: Awaiting skilled nursing facility placement    Code Status:     Code Status Orders  (From admission, onward)           Start     Ordered   04/19/24 1811  Do not attempt resuscitation (DNR)- Limited -Do Not Intubate (DNI)  (Code Status)  Continuous       Question Answer Comment  If pulseless and not breathing No CPR or chest compressions.   In Pre-Arrest Conditions (Patient Is Breathing and Has A Pulse) Do not intubate. Provide all appropriate non-invasive medical interventions. Avoid ICU transfer unless indicated or required.   Consent: Discussion documented in EHR or advanced directives reviewed      04/19/24 1810           Code Status History     Date Active Date Inactive Code Status Order ID Comments User Context   04/19/2024 1807 04/19/2024 1810 Full Code [id number]  Jude Harden GAILS, MD ED   11/07/2023 1641 11/25/2023 2255 Limited: Do not attempt resuscitation (DNR) -DNR-LIMITED -Do Not Intubate/DNI  [id number]  Rodman Recardo NOVAK, NP Inpatient   11/01/2023 1312 11/07/2023  1641 Full Code [id number]  Raenelle Donalda HERO, MD Inpatient   11/01/2023 1016 11/01/2023 1312 Do not attempt resuscitation (DNR) - Comfort care [id number]  Raenelle Donalda HERO, MD Inpatient   10/31/2023 2212 11/01/2023 1016 Limited: Do not attempt resuscitation (DNR)  -DNR-LIMITED -Do Not Intubate/DNI  [id number]  Rodman Recardo NOVAK, NP Inpatient   10/27/2023 2251 10/31/2023 2212 Do not attempt resuscitation (DNR) PRE-ARREST INTERVENTIONS DESIRED [id number]  Keturah Carrier, MD ED   10/27/2023 2224 10/27/2023 2251 Full Code [id number]  Keturah Carrier, MD ED   02/13/2013 1824 02/15/2013 1410 Full Code [id number]  Harden Jerona GAILS, MD Inpatient   01/12/2013 1954 01/15/2013 1812 Full Code [id number]  Harden Jerona GAILS, MD Inpatient   01/08/2013 2129 01/12/2013 1954 Full Code [id number]  Billy Dallas GAILS, MD Inpatient      Advance Directive Documentation    Flowsheet Row Most Recent Value  Type of Advance Directive Out of facility DNR (pink MOST or yellow form)  Pre-existing out of facility DNR order (yellow form or pink MOST form) --  MOST Form in Place? --      IV Access:   Peripheral IV   Procedures and diagnostic studies:   VAS US  UPPER EXTREMITY VENOUS DUPLEX Result Date: 04/24/2024 UPPER VENOUS STUDY  Patient Name:  [name] [facility]  Date of Exam:   04/24/2024 Medical Rec #: [id number]          Accession #:    [id number] Date of Birth: [birth year]          Patient Gender: F Patient Age:   82 years Exam Location:  [facility] Procedure:      VAS US  UPPER EXTREMITY VENOUS DUPLEX Referring Phys: MATTHEW HUNSUCKER --------------------------------------------------------------------------------  Indications: Erythema and edema medial right forearm Limitations: Bandages and line. Performing Technologist: Ricka Sturdivant-Jones RDMS, RVT  Examination Guidelines: A complete evaluation includes B-mode imaging, spectral Doppler, color Doppler, and power Doppler as needed of all accessible portions of each vessel. Bilateral testing is considered an integral part of a complete examination. Limited examinations for reoccurring indications may be performed as noted.  Right Findings: +----------+------------+---------+-----------+----------+-------+ RIGHT      CompressiblePhasicitySpontaneousPropertiesSummary +----------+------------+---------+-----------+----------+-------+ IJV           None       No        No                Acute  +----------+------------+---------+-----------+----------+-------+ Subclavian               Yes       Yes                      +----------+------------+---------+-----------+----------+-------+ Axillary      Full       Yes       Yes                      +----------+------------+---------+-----------+----------+-------+ Brachial      Full                                          +----------+------------+---------+-----------+----------+-------+ Radial        Full                                          +----------+------------+---------+-----------+----------+-------+  Ulnar         Full                                          +----------+------------+---------+-----------+----------+-------+ Cephalic    Partial                                  Acute  +----------+------------+---------+-----------+----------+-------+ Basilic       Full                                          +----------+------------+---------+-----------+----------+-------+ Poor visualization of the right subclavian vein due to tape/bandages. Distal segment appears patent.  Left Findings: +----------+------------+---------+-----------+----------+-------+ LEFT      CompressiblePhasicitySpontaneousPropertiesSummary +----------+------------+---------+-----------+----------+-------+ IJV           Full       Yes       Yes                      +----------+------------+---------+-----------+----------+-------+ Subclavian    Full       Yes       Yes                      +----------+------------+---------+-----------+----------+-------+ Axillary      Full       Yes       Yes                      +----------+------------+---------+-----------+----------+-------+ Brachial      Full                                           +----------+------------+---------+-----------+----------+-------+ Radial        Full                                          +----------+------------+---------+-----------+----------+-------+ Ulnar         Full                                          +----------+------------+---------+-----------+----------+-------+ Cephalic      Full                                          +----------+------------+---------+-----------+----------+-------+ Basilic       Full                                          +----------+------------+---------+-----------+----------+-------+  Summary:  Right: Findings consistent with acute deep vein thrombosis involving the right internal jugular vein. Findings consistent with acute superficial vein thrombosis involving the right cephalic vein at the antecubital location.  Left: No evidence of deep vein thrombosis in the upper extremity. No evidence  of superficial vein thrombosis in the upper extremity.  *See table(s) above for measurements and observations.  Diagnosing physician: Debby Robertson Electronically signed by Debby Robertson on 04/24/2024 at 4:51:39 PM.    Final    IR Fluoro Guide CV Line Left Result Date: 04/23/2024 INDICATION: 68 year old female with history of end-stage renal disease requiring re-establishment of adequate hemodialysis access. EXAM: TUNNELED CENTRAL VENOUS HEMODIALYSIS CATHETER PLACEMENT WITH ULTRASOUND AND FLUOROSCOPIC GUIDANCE MEDICATIONS: Ancef  2 gm IV . The antibiotic was given in an appropriate time interval prior to skin puncture. ANESTHESIA/SEDATION: Moderate (conscious) sedation was employed during this procedure. A total of Versed  1 mg and Fentanyl  50 mcg was administered intravenously. Moderate Sedation Time: 40 minutes. The patient's level of consciousness and vital signs were monitored continuously by radiology nursing throughout the procedure under my direct supervision. FLUOROSCOPY TIME:   Sixty mGy reference air kerma COMPLICATIONS: None immediate. PROCEDURE: Informed written consent was obtained from the patient after a discussion of the risks, benefits, and alternatives to treatment. Questions regarding the procedure were encouraged and answered. Preprocedure ultrasound evaluation demonstrated occlusion of the right internal jugular vein. The left neck and chest were prepped with chlorhexidine  in a sterile fashion, and a sterile drape was applied covering the operative field. Maximum barrier sterile technique with sterile gowns and gloves were used for the procedure. A timeout was performed prior to the initiation of the procedure. After creating a small venotomy incision, a 21 gauge micropuncture kit was utilized to access the internal jugular vein. Real-time ultrasound guidance was utilized for vascular access including the acquisition of a permanent ultrasound image documenting patency of the accessed vessel. A Rosen wire was advanced to the level of the IVC and the micropuncture sheath was exchanged for an 8 Fr dilator. A 14.5 French tunneled hemodialysis catheter measuring 23 cm from tip to cuff was tunneled in a retrograde fashion from the anterior chest wall to the venotomy incision. Serial dilation was then performed an a peel-away sheath was placed. The catheter was then placed through the peel-away sheath with the catheter tip ultimately positioned within the right atrium. Final catheter positioning was confirmed and documented with a spot radiographic image. The catheter aspirates and flushes normally. The catheter was flushed with appropriate volume heparin  dwells. The catheter exit site was secured with a 0-Silk retention suture. The venotomy incision was closed with Dermabond. Sterile dressings were applied. The patient tolerated the procedure well without immediate post procedural complication. IMPRESSION: Successful placement of 23 cm tip to cuff tunneled hemodialysis catheter via  the left internal jugular vein with catheter tip terminating within the right atrium. The catheter is ready for immediate use. Ester Sides, MD [facility] Radiology Electronically Signed   By: Ester Sides M.D.   On: 04/23/2024 22:49   IR US  Guide Vasc Access Left Result Date: 04/23/2024 INDICATION: 68 year old female with history of end-stage renal disease requiring re-establishment of adequate hemodialysis access. EXAM: TUNNELED CENTRAL VENOUS HEMODIALYSIS CATHETER PLACEMENT WITH ULTRASOUND AND FLUOROSCOPIC GUIDANCE MEDICATIONS: Ancef  2 gm IV . The antibiotic was given in an appropriate time interval prior to skin puncture. ANESTHESIA/SEDATION: Moderate (conscious) sedation was employed during this procedure. A total of Versed  1 mg and Fentanyl  50 mcg was administered intravenously. Moderate Sedation Time: 40 minutes. The patient's level of consciousness and vital signs were monitored continuously by radiology nursing throughout the procedure under my direct supervision. FLUOROSCOPY TIME:  Sixty mGy reference air kerma COMPLICATIONS: None immediate. PROCEDURE: Informed written consent was  obtained from the patient after a discussion of the risks, benefits, and alternatives to treatment. Questions regarding the procedure were encouraged and answered. Preprocedure ultrasound evaluation demonstrated occlusion of the right internal jugular vein. The left neck and chest were prepped with chlorhexidine  in a sterile fashion, and a sterile drape was applied covering the operative field. Maximum barrier sterile technique with sterile gowns and gloves were used for the procedure. A timeout was performed prior to the initiation of the procedure. After creating a small venotomy incision, a 21 gauge micropuncture kit was utilized to access the internal jugular vein. Real-time ultrasound guidance was utilized for vascular access including the acquisition of a permanent  ultrasound image documenting patency of the accessed vessel. A Rosen wire was advanced to the level of the IVC and the micropuncture sheath was exchanged for an 8 Fr dilator. A 14.5 French tunneled hemodialysis catheter measuring 23 cm from tip to cuff was tunneled in a retrograde fashion from the anterior chest wall to the venotomy incision. Serial dilation was then performed an a peel-away sheath was placed. The catheter was then placed through the peel-away sheath with the catheter tip ultimately positioned within the right atrium. Final catheter positioning was confirmed and documented with a spot radiographic image. The catheter aspirates and flushes normally. The catheter was flushed with appropriate volume heparin  dwells. The catheter exit site was secured with a 0-Silk retention suture. The venotomy incision was closed with Dermabond. Sterile dressings were applied. The patient tolerated the procedure well without immediate post procedural complication. IMPRESSION: Successful placement of 23 cm tip to cuff tunneled hemodialysis catheter via the left internal jugular vein with catheter tip terminating within the right atrium. The catheter is ready for immediate use. Ester Sides, MD [facility] Radiology Electronically Signed   By: Ester Sides M.D.   On: 04/23/2024 22:49     Medical Consultants:   None.   Subjective:    KATALIA CHOMA no complaints feels better  Objective:    Vitals:   04/24/24 1500 04/24/24 1526 04/24/24 1919 04/25/24 0416  BP: (!) 111/54 106/84 126/62 (!) 110/58  Pulse: 73 73 (!) 57 75  Resp: 14 16 16 16   Temp:  98 F (36.7 C) 97.7 F (36.5 C) 98.4 F (36.9 C)  TempSrc:  Oral Oral Oral  SpO2: 95% 99% 99% 100%  Weight:      Height:       SpO2: 100 % O2 Flow Rate (L/min): 2 L/min   Intake/Output Summary (Last 24 hours) at 04/25/2024 9297 Last data filed at 04/24/2024 1500 Gross per 24 hour  Intake 389.9 ml   Output 50 ml  Net 339.9 ml   Filed Weights   04/21/24 1254 04/23/24 0500 04/24/24 0645  Weight: 76 kg 71.6 kg 73 kg    Exam: General exam: In no acute distress. Respiratory system: Good air movement and clear to auscultation. Cardiovascular system: S1 & S2 heard, RRR. [name]. Gastrointestinal system: Abdomen is nondistended, soft and nontender.  Extremities: Right BKA Skin: No rashes, lesions or ulcers Psychiatry: Judgement and insight appear normal. Mood & affect appropriate.    Data Reviewed:    Labs: Basic Metabolic Panel: Recent Labs  Lab 04/20/24 0340 04/21/24 0520 04/22/24 0500 04/23/24 0852 04/24/24 0656 04/25/24 0500  NA 128* 128* 126* 125* 129* 129*  127*  K 3.3* 3.4* 3.4* 3.2* 3.1* 4.6  4.5  CL 92* 95* 94* 95* 95* 94*  94*  CO2 21*  22 21* 20* 25 23  23   GLUCOSE 374* 124* 142* 62* 133* 262*  257*  BUN 55* 58* 74* 85* 35* 50*  51*  CREATININE 6.89* 6.86* 7.33* 8.26* 4.21* 5.42*  5.38*  CALCIUM  8.1* 8.0* 7.6* 7.2* 7.1* 7.1*  6.9*  MG 1.9 2.0  --   --   --  1.8  PHOS 3.9 4.2 4.3  --   --  3.5   GFR Estimated Creatinine Clearance: 9.7 mL/min (A) (by C-G formula based on SCr of 5.42 mg/dL (H)). Liver Function Tests: Recent Labs  Lab 04/19/24 1341 04/22/24 0500 04/25/24 0500  AST 13*  --   --   ALT 12  --   --   ALKPHOS 106  --   --   BILITOT 2.0*  --   --   PROT 5.4*  --   --   ALBUMIN  1.5* <1.5* <1.5*   No results for input(s): LIPASE, AMYLASE in the last 168 hours. No results for input(s): AMMONIA in the last 168 hours. Coagulation profile Recent Labs  Lab 04/19/24 1341  INR 1.1   COVID-19 Labs  No results for input(s): DDIMER, FERRITIN, LDH, CRP in the last 72 hours.  Lab Results  Component Value Date   SARSCOV2NAA NEGATIVE 04/19/2024   SARSCOV2NAA NEGATIVE 10/27/2023    CBC: Recent Labs  Lab 04/19/24 1341 04/20/24 0340 04/21/24 0520 04/22/24 0500 04/23/24 0852  WBC 28.5* 30.2* 19.1* 17.8* 16.4*   NEUTROABS 27.4*  --   --   --   --   HGB 9.4* 9.1* 7.8* 7.7* 7.5*  HCT 29.2* 27.5* 23.7* 23.7* 23.5*  MCV 82.7 79.5* 78.7* 79.5* 80.2  PLT 225 244 156 194 136*   Cardiac Enzymes: No results for input(s): CKTOTAL, CKMB, CKMBINDEX, TROPONINI in the last 168 hours. BNP (last 3 results) No results for input(s): PROBNP in the last 8760 hours. CBG: Recent Labs  Lab 04/24/24 1027 04/24/24 1114 04/24/24 1627 04/24/24 2106 04/25/24 0127  GLUCAP 155* 196* 251* 283* 293*   D-Dimer: No results for input(s): DDIMER in the last 72 hours. Hgb A1c: No results for input(s): HGBA1C in the last 72 hours. Lipid Profile: No results for input(s): CHOL, HDL, LDLCALC, TRIG, CHOLHDL, LDLDIRECT in the last 72 hours. Thyroid  function studies: No results for input(s): TSH, T4TOTAL, T3FREE, THYROIDAB in the last 72 hours.  Invalid input(s): FREET3 Anemia work up: No results for input(s): VITAMINB12, FOLATE, FERRITIN, TIBC, IRON , RETICCTPCT in the last 72 hours. Sepsis Labs: Recent Labs  Lab 04/19/24 1409 04/19/24 2016 04/20/24 0340 04/21/24 0520 04/22/24 0500 04/23/24 0852  WBC  --   --  30.2* 19.1* 17.8* 16.4*  LATICACIDVEN 2.6* 1.5  --   --   --   --    Microbiology Recent Results (from the past 240 hours)  Resp panel by RT-PCR (RSV, Flu A&B, Covid) Anterior Nasal Swab     Status: None   Collection Time: 04/19/24  1:41 PM   Specimen: Anterior Nasal Swab  Result Value Ref Range Status   SARS Coronavirus 2 by RT PCR NEGATIVE NEGATIVE Final   Influenza A by PCR NEGATIVE NEGATIVE Final   Influenza B by PCR NEGATIVE NEGATIVE Final    Comment: (NOTE) The Xpert Xpress SARS-CoV-2/FLU/RSV plus assay is intended as an aid in the diagnosis of influenza from Nasopharyngeal swab specimens and should not be used as a sole basis for treatment. Nasal washings and aspirates are unacceptable for Xpert Xpress SARS-CoV-2/FLU/RSV testing.  Fact Sheet for  Patients: BloggerCourse.com  Fact Sheet for Healthcare Providers: SeriousBroker.it  This test is not yet approved or cleared by the [place]  FDA and has been authorized for detection and/or diagnosis of SARS-CoV-2 by FDA under an Emergency Use Authorization (EUA). This EUA will remain in effect (meaning this test can be used) for the duration of the COVID-19 declaration under Section 564(b)(1) of the Act, 21 U.S.C. section 360bbb-3(b)(1), unless the authorization is terminated or revoked.     Resp Syncytial Virus by PCR NEGATIVE NEGATIVE Final    Comment: (NOTE) Fact Sheet for Patients: BloggerCourse.com  Fact Sheet for Healthcare Providers: SeriousBroker.it  This test is not yet approved or cleared by the [place]  FDA and has been authorized for detection and/or diagnosis of SARS-CoV-2 by FDA under an Emergency Use Authorization (EUA). This EUA will remain in effect (meaning this test can be used) for the duration of the COVID-19 declaration under Section 564(b)(1) of the Act, 21 U.S.C. section 360bbb-3(b)(1), unless the authorization is terminated or revoked.  Performed at [facility], [address], [place], [place] [address]   Blood Culture (routine x 2)     Status: Abnormal   Collection Time: 04/19/24  1:41 PM   Specimen: BLOOD  Result Value Ref Range Status   Specimen Description BLOOD LEFT ANTECUBITAL  Final   Special Requests   Final    BOTTLES DRAWN AEROBIC AND ANAEROBIC Blood Culture results may not be optimal due to an inadequate volume of blood received in culture bottles   Culture  Setup Time   Final    GRAM POSITIVE COCCI IN BOTH AEROBIC AND ANAEROBIC BOTTLES CRITICAL VALUE NOTED.  VALUE IS CONSISTENT WITH PREVIOUSLY REPORTED AND CALLED VALUE. Performed at [facility], [address], [place], [place] [address]    Culture (A)  Final     STAPHYLOCOCCUS AUREUS SUSCEPTIBILITIES PERFORMED ON PREVIOUS CULTURE WITHIN THE LAST 5 DAYS. GROUP B STREP(S.AGALACTIAE)ISOLATED    Report Status 04/23/2024 FINAL  Final   Organism ID, Bacteria GROUP B STREP(S.AGALACTIAE)ISOLATED  Final      Susceptibility   Group b strep(s.agalactiae)isolated - MIC*    CLINDAMYCIN  >=1 RESISTANT Resistant     AMPICILLIN <=0.25 SENSITIVE Sensitive     ERYTHROMYCIN  >=8 RESISTANT Resistant     VANCOMYCIN  0.5 SENSITIVE Sensitive     CEFTRIAXONE <=0.12 SENSITIVE Sensitive     LEVOFLOXACIN 1 SENSITIVE Sensitive     PENICILLIN <=0.06 SENSITIVE Sensitive     * GROUP B STREP(S.AGALACTIAE)ISOLATED  Blood Culture (routine x 2)     Status: Abnormal (Preliminary result)   Collection Time: 04/19/24  1:46 PM   Specimen: BLOOD  Result Value Ref Range Status   Specimen Description BLOOD RIGHT ANTECUBITAL  Final   Special Requests   Final    BOTTLES DRAWN AEROBIC AND ANAEROBIC Blood Culture results may not be optimal due to an inadequate volume of blood received in culture bottles   Culture  Setup Time   Final    GRAM POSITIVE COCCI IN BOTH AEROBIC AND ANAEROBIC BOTTLES CRITICAL RESULT CALLED TO, READ BACK BY AND VERIFIED WITH: PHARMD G ABBOTT 04/20/2024 @ 0539 BY AB    Culture (A)  Final    METHICILLIN RESISTANT STAPHYLOCOCCUS AUREUS CULTURE REINCUBATED FOR BETTER GROWTH Performed at [facility], [address], [place], [place] [address]    Report Status PENDING  Incomplete   Organism ID, Bacteria METHICILLIN RESISTANT STAPHYLOCOCCUS AUREUS  Final      Susceptibility  Methicillin resistant staphylococcus aureus - MIC*    CIPROFLOXACIN  >=8 RESISTANT Resistant     ERYTHROMYCIN  >=8 RESISTANT Resistant     GENTAMICIN  <=0.5 SENSITIVE Sensitive     OXACILLIN >=4 RESISTANT Resistant     TETRACYCLINE <=1 SENSITIVE Sensitive     VANCOMYCIN  1 SENSITIVE Sensitive     TRIMETH/SULFA <=10 SENSITIVE Sensitive     CLINDAMYCIN  >=8 RESISTANT Resistant      RIFAMPIN <=0.5 SENSITIVE Sensitive     Inducible Clindamycin  NEGATIVE Sensitive     LINEZOLID 2 SENSITIVE Sensitive     * METHICILLIN RESISTANT STAPHYLOCOCCUS AUREUS  Blood Culture ID Panel (Reflexed)     Status: Abnormal   Collection Time: 04/19/24  1:46 PM  Result Value Ref Range Status   Enterococcus faecalis NOT DETECTED NOT DETECTED Final   Enterococcus Faecium NOT DETECTED NOT DETECTED Final   Listeria monocytogenes NOT DETECTED NOT DETECTED Final   Staphylococcus species DETECTED (A) NOT DETECTED Final    Comment: CRITICAL RESULT CALLED TO, READ BACK BY AND VERIFIED WITH: PHARMD G ABBOTT 04/20/2024 @ 0539 BY AB    Staphylococcus aureus (BCID) DETECTED (A) NOT DETECTED Final    Comment: Methicillin (oxacillin)-resistant Staphylococcus aureus (MRSA). MRSA is predictably resistant to beta-lactam antibiotics (except ceftaroline). Preferred therapy is vancomycin  unless clinically contraindicated. Patient requires contact precautions if  hospitalized. CRITICAL RESULT CALLED TO, READ BACK BY AND VERIFIED WITH: PHARMD G ABBOTT 04/20/2024 @ 0539 BY AB    Staphylococcus epidermidis NOT DETECTED NOT DETECTED Final   Staphylococcus lugdunensis NOT DETECTED NOT DETECTED Final   Streptococcus species DETECTED (A) NOT DETECTED Final    Comment: CRITICAL RESULT CALLED TO, READ BACK BY AND VERIFIED WITH: PHARMD G ABBOTT 04/20/2024 @ 0539 BY AB    Streptococcus agalactiae DETECTED (A) NOT DETECTED Final    Comment: CRITICAL RESULT CALLED TO, READ BACK BY AND VERIFIED WITH: PHARMD G ABBOTT 04/20/2024 @ 0539 BY AB    Streptococcus pneumoniae NOT DETECTED NOT DETECTED Final   Streptococcus pyogenes NOT DETECTED NOT DETECTED Final   A.calcoaceticus-baumannii NOT DETECTED NOT DETECTED Final   Bacteroides fragilis NOT DETECTED NOT DETECTED Final   Enterobacterales NOT DETECTED NOT DETECTED Final   Enterobacter cloacae complex NOT DETECTED NOT DETECTED Final   Escherichia coli NOT DETECTED NOT DETECTED  Final   Klebsiella aerogenes NOT DETECTED NOT DETECTED Final   Klebsiella oxytoca NOT DETECTED NOT DETECTED Final   Klebsiella pneumoniae NOT DETECTED NOT DETECTED Final   Proteus species NOT DETECTED NOT DETECTED Final   Salmonella species NOT DETECTED NOT DETECTED Final   Serratia marcescens NOT DETECTED NOT DETECTED Final   Haemophilus influenzae NOT DETECTED NOT DETECTED Final   Neisseria meningitidis NOT DETECTED NOT DETECTED Final   Pseudomonas aeruginosa NOT DETECTED NOT DETECTED Final   Stenotrophomonas maltophilia NOT DETECTED NOT DETECTED Final   Candida albicans NOT DETECTED NOT DETECTED Final   Candida auris NOT DETECTED NOT DETECTED Final   Candida glabrata NOT DETECTED NOT DETECTED Final   Candida krusei NOT DETECTED NOT DETECTED Final   Candida parapsilosis NOT DETECTED NOT DETECTED Final   Candida tropicalis NOT DETECTED NOT DETECTED Final   Cryptococcus neoformans/gattii NOT DETECTED NOT DETECTED Final   Meth resistant mecA/C and MREJ DETECTED (A) NOT DETECTED Final    Comment: CRITICAL RESULT CALLED TO, READ BACK BY AND VERIFIED WITH: PHARMD G ABBOTT 04/20/2024 @ 0539 BY AB Performed at [facility], [address], [place], [place] [address]   MRSA Next Gen by  PCR, Nasal     Status: None   Collection Time: 04/19/24  7:17 PM   Specimen: Nasal Mucosa; Nasal Swab  Result Value Ref Range Status   MRSA by PCR Next Gen NOT DETECTED NOT DETECTED Final    Comment: (NOTE) The GeneXpert MRSA Assay (FDA approved for NASAL specimens only), is one component of a comprehensive MRSA colonization surveillance program. It is not intended to diagnose MRSA infection nor to guide or monitor treatment for MRSA infections. Test performance is not FDA approved in patients less than 49 years old. Performed at [facility], [address], [place], [place] [address]   Aerobic/Anaerobic Culture w Gram Stain (surgical/deep wound)     Status: None (Preliminary result)    Collection Time: 04/20/24  6:22 PM   Specimen: Soft Tissue, Other  Result Value Ref Range Status   Specimen Description TISSUE  Final   Special Requests RIGHT LEG  Final   Gram Stain NO WBC SEEN NO ORGANISMS SEEN   Final   Culture   Final    NO GROWTH 4 DAYS NO ANAEROBES ISOLATED; CULTURE IN PROGRESS FOR 5 DAYS Performed at [facility], [address] [place], [place] [address]    Report Status PENDING  Incomplete  Culture, blood (Routine X 2) w Reflex to ID Panel     Status: None (Preliminary result)   Collection Time: 04/21/24  6:16 AM   Specimen: BLOOD  Result Value Ref Range Status   Specimen Description BLOOD BLOOD LEFT HAND  Final   Special Requests   Final    BOTTLES DRAWN AEROBIC AND ANAEROBIC Blood Culture adequate volume   Culture   Final    NO GROWTH 3 DAYS Performed at [facility], [address], [place], [place] [address]    Report Status PENDING  Incomplete  Culture, blood (Routine X 2) w Reflex to ID Panel     Status: None (Preliminary result)   Collection Time: 04/21/24  6:21 AM   Specimen: BLOOD  Result Value Ref Range Status   Specimen Description BLOOD BLOOD RIGHT ARM  Final   Special Requests   Final    BOTTLES DRAWN AEROBIC AND ANAEROBIC Blood Culture adequate volume   Culture   Final    NO GROWTH 3 DAYS Performed at [facility], [address] [place], [place] [address]    Report Status PENDING  Incomplete     Medications:    apixaban   10 mg Oral BID   Followed by   NOREEN ON 05/01/2024] apixaban   5 mg Oral BID   vitamin C   1,000 mg Oral Daily   brimonidine   1 drop Both Eyes BID   Chlorhexidine  Gluconate Cloth  6 each Topical Q0600   insulin  aspart  0-6 Units Subcutaneous TID WC   latanoprost   1 drop Both Eyes QHS   midodrine   10 mg Oral TID WC   nutrition supplement (JUVEN)  1 packet Oral BID BM   zinc  sulfate (50mg  elemental zinc )  220 mg Oral Daily   Continuous Infusions:  [START ON 04/27/2024] vancomycin       vancomycin         LOS: 6 days   Erle Odell Castor  [facility]  04/25/2024, 7:02 AM

## 2024-04-25 NOTE — Progress Notes (Signed)
   04/25/24 1740  Vitals  Temp 97.7 F (36.5 C)  Pulse Rate 69  Resp 13  BP (!) 119/58  SpO2 100 %  O2 Device Room Air  Weight 74.3 kg  Type of Weight Post-Dialysis  Oxygen Therapy  Patient Activity (if Appropriate) In bed  Pulse Oximetry Type Continuous  Post Treatment  Dialyzer Clearance Clear  Liters Processed 78  Fluid Removed (mL) 3000 mL     Received patient in bed to unit.  Alert and oriented.  Informed consent signed and in chart.    TX duration: 3.25hrs   Patient tolerated well.  Transported back to the room  Alert, without acute distress.  Hand-off given to patient's nurse.    Access used: LCVC Access issues: Reversed lines, arterial access with resistance   Total UF removed: 3L Medication(s) given:  see eMAR    Woodfin LITTIE Dolores, RN  [facility]

## 2024-04-25 NOTE — Plan of Care (Signed)
  Problem: Coping: Goal: Level of anxiety will decrease Outcome: Progressing   Problem: Pain Managment: Goal: General experience of comfort will improve and/or be controlled Outcome: Progressing   Problem: Safety: Goal: Ability to remain free from injury will improve Outcome: Progressing   Problem: Skin Integrity: Goal: Risk for impaired skin integrity will decrease Outcome: Progressing   Problem: Coping: Goal: Ability to adjust to condition or change in health will improve Outcome: Progressing   Problem: Pain Management: Goal: Pain level will decrease with appropriate interventions Outcome: Progressing

## 2024-04-25 NOTE — Progress Notes (Signed)
 Notified [place] about patient blood glucose of 293, no coverage given at this time. PA stated to recheck blood glucose before breakfast in the morning. Patient q4h blood glucose monitoring has been discontinued. She has orders for CBG monitoring to be done before meals and at bedtime.

## 2024-04-25 NOTE — Progress Notes (Signed)
 [facility] Progress Note   Subjective:  Seen in room - eating breakfast and having some nausea. No CP/dyspnea. Plan is for short HD today.  Objective Vitals:   04/24/24 1526 04/24/24 1919 04/25/24 0416 04/25/24 0740  BP: 106/84 126/62 (!) 110/58 118/66  Pulse: 73 (!) 57 75 78  Resp: 16 16 16 17   Temp: 98 F (36.7 C) 97.7 F (36.5 C) 98.4 F (36.9 C) 97.7 F (36.5 C)  TempSrc: Oral Oral Oral Oral  SpO2: 99% 99% 100% 100%  Weight:      Height:       Physical Exam General: Well appearing woman, NAD. Room air.  Heart: RRR; no murmur Lungs: CTAB Abdomen: soft Extremities: 1+ BLE edema Dialysis Access: [facility] in R chest  Additional Objective Labs: Basic Metabolic Panel: Recent Labs  Lab 04/21/24 0520 04/22/24 0500 04/23/24 0852 04/24/24 0656 04/25/24 0500  NA 128* 126* 125* 129* 129*  127*  K 3.4* 3.4* 3.2* 3.1* 4.6  4.5  CL 95* 94* 95* 95* 94*  94*  CO2 22 21* 20* 25 23  23   GLUCOSE 124* 142* 62* 133* 262*  257*  BUN 58* 74* 85* 35* 50*  51*  CREATININE 6.86* 7.33* 8.26* 4.21* 5.42*  5.38*  CALCIUM  8.0* 7.6* 7.2* 7.1* 7.1*  6.9*  PHOS 4.2 4.3  --   --  3.5   Liver Function Tests: Recent Labs  Lab 04/19/24 1341 04/22/24 0500 04/25/24 0500  AST 13*  --   --   ALT 12  --   --   ALKPHOS 106  --   --   BILITOT 2.0*  --   --   PROT 5.4*  --   --   ALBUMIN  1.5* <1.5* <1.5*   CBC: Recent Labs  Lab 04/19/24 1341 04/20/24 0340 04/21/24 0520 04/22/24 0500 04/23/24 0852  WBC 28.5* 30.2* 19.1* 17.8* 16.4*  NEUTROABS 27.4*  --   --   --   --   HGB 9.4* 9.1* 7.8* 7.7* 7.5*  HCT 29.2* 27.5* 23.7* 23.7* 23.5*  MCV 82.7 79.5* 78.7* 79.5* 80.2  PLT 225 244 156 194 136*   Blood Culture    Component Value Date/Time   SDES BLOOD BLOOD RIGHT ARM 04/21/2024 0621   SPECREQUEST  04/21/2024 0621    BOTTLES DRAWN AEROBIC AND ANAEROBIC Blood Culture adequate volume   CULT  04/21/2024 0621    NO GROWTH 4 DAYS Performed at [facility]  Lab, [address] [place], [place] [address]    REPTSTATUS PENDING 04/21/2024 9378   Studies/Results: VAS US  UPPER EXTREMITY VENOUS DUPLEX Result Date: 04/24/2024 UPPER VENOUS STUDY  Patient Name:  BRIELYN BOSAK [facility]  Date of Exam:   04/24/2024 Medical Rec #: [id number]          Accession #:    [id number] Date of Birth: [birth year]          Patient Gender: F Patient Age:   68 years Exam Location:  [facility] Procedure:      VAS US  UPPER EXTREMITY VENOUS DUPLEX Referring Phys: MATTHEW HUNSUCKER --------------------------------------------------------------------------------  Indications: Erythema and edema medial right forearm Limitations: Bandages and line. Performing Technologist: Ricka Sturdivant-Jones RDMS, RVT  Examination Guidelines: A complete evaluation includes B-mode imaging, spectral Doppler, color Doppler, and power Doppler as needed of all accessible portions of each vessel. Bilateral testing is considered an integral part of a complete examination. Limited examinations for reoccurring indications may be performed as noted.  Right Findings: +----------+------------+---------+-----------+----------+-------+  RIGHT     CompressiblePhasicitySpontaneousPropertiesSummary +----------+------------+---------+-----------+----------+-------+ IJV           None       No        No                Acute  +----------+------------+---------+-----------+----------+-------+ Subclavian               Yes       Yes                      +----------+------------+---------+-----------+----------+-------+ Axillary      Full       Yes       Yes                      +----------+------------+---------+-----------+----------+-------+ Brachial      Full                                          +----------+------------+---------+-----------+----------+-------+ Radial        Full                                          +----------+------------+---------+-----------+----------+-------+  Ulnar         Full                                          +----------+------------+---------+-----------+----------+-------+ Cephalic    Partial                                  Acute  +----------+------------+---------+-----------+----------+-------+ Basilic       Full                                          +----------+------------+---------+-----------+----------+-------+ Poor visualization of the right subclavian vein due to tape/bandages. Distal segment appears patent.  Left Findings: +----------+------------+---------+-----------+----------+-------+ LEFT      CompressiblePhasicitySpontaneousPropertiesSummary +----------+------------+---------+-----------+----------+-------+ IJV           Full       Yes       Yes                      +----------+------------+---------+-----------+----------+-------+ Subclavian    Full       Yes       Yes                      +----------+------------+---------+-----------+----------+-------+ Axillary      Full       Yes       Yes                      +----------+------------+---------+-----------+----------+-------+ Brachial      Full                                          +----------+------------+---------+-----------+----------+-------+ Radial        Full                                          +----------+------------+---------+-----------+----------+-------+  Ulnar         Full                                          +----------+------------+---------+-----------+----------+-------+ Cephalic      Full                                          +----------+------------+---------+-----------+----------+-------+ Basilic       Full                                          +----------+------------+---------+-----------+----------+-------+  Summary:  Right: Findings consistent with acute deep vein thrombosis involving the right internal jugular vein. Findings consistent with acute superficial  vein thrombosis involving the right cephalic vein at the antecubital location.  Left: No evidence of deep vein thrombosis in the upper extremity. No evidence of superficial vein thrombosis in the upper extremity.  *See table(s) above for measurements and observations.  Diagnosing physician: Debby Robertson Electronically signed by Debby Robertson on 04/24/2024 at 4:51:39 PM.    Final    IR Fluoro Guide CV Line Left Result Date: 04/23/2024 INDICATION: 68 year old female with history of end-stage renal disease requiring re-establishment of adequate hemodialysis access. EXAM: TUNNELED CENTRAL VENOUS HEMODIALYSIS CATHETER PLACEMENT WITH ULTRASOUND AND FLUOROSCOPIC GUIDANCE MEDICATIONS: Ancef  2 gm IV . The antibiotic was given in an appropriate time interval prior to skin puncture. ANESTHESIA/SEDATION: Moderate (conscious) sedation was employed during this procedure. A total of Versed  1 mg and Fentanyl  50 mcg was administered intravenously. Moderate Sedation Time: 40 minutes. The patient's level of consciousness and vital signs were monitored continuously by radiology nursing throughout the procedure under my direct supervision. FLUOROSCOPY TIME:  Sixty mGy reference air kerma COMPLICATIONS: None immediate. PROCEDURE: Informed written consent was obtained from the patient after a discussion of the risks, benefits, and alternatives to treatment. Questions regarding the procedure were encouraged and answered. Preprocedure ultrasound evaluation demonstrated occlusion of the right internal jugular vein. The left neck and chest were prepped with chlorhexidine  in a sterile fashion, and a sterile drape was applied covering the operative field. Maximum barrier sterile technique with sterile gowns and gloves were used for the procedure. A timeout was performed prior to the initiation of the procedure. After creating a small venotomy incision, a 21 gauge micropuncture kit was utilized to access the internal jugular vein. Real-time  ultrasound guidance was utilized for vascular access including the acquisition of a permanent ultrasound image documenting patency of the accessed vessel. A Rosen wire was advanced to the level of the IVC and the micropuncture sheath was exchanged for an 8 Fr dilator. A 14.5 French tunneled hemodialysis catheter measuring 23 cm from tip to cuff was tunneled in a retrograde fashion from the anterior chest wall to the venotomy incision. Serial dilation was then performed an a peel-away sheath was placed. The catheter was then placed through the peel-away sheath with the catheter tip ultimately positioned within the right atrium. Final catheter positioning was confirmed and documented with a spot radiographic image. The catheter aspirates and flushes normally. The catheter was flushed with appropriate volume heparin  dwells. The catheter exit site was secured with a 0-Silk retention suture. The venotomy  incision was closed with Dermabond. Sterile dressings were applied. The patient tolerated the procedure well without immediate post procedural complication. IMPRESSION: Successful placement of 23 cm tip to cuff tunneled hemodialysis catheter via the left internal jugular vein with catheter tip terminating within the right atrium. The catheter is ready for immediate use. Ester Sides, MD [facility] Radiology Electronically Signed   By: Ester Sides M.D.   On: 04/23/2024 22:49   IR US  Guide Vasc Access Left Result Date: 04/23/2024 INDICATION: 68 year old female with history of end-stage renal disease requiring re-establishment of adequate hemodialysis access. EXAM: TUNNELED CENTRAL VENOUS HEMODIALYSIS CATHETER PLACEMENT WITH ULTRASOUND AND FLUOROSCOPIC GUIDANCE MEDICATIONS: Ancef  2 gm IV . The antibiotic was given in an appropriate time interval prior to skin puncture. ANESTHESIA/SEDATION: Moderate (conscious) sedation was employed during this procedure. A total of  Versed  1 mg and Fentanyl  50 mcg was administered intravenously. Moderate Sedation Time: 40 minutes. The patient's level of consciousness and vital signs were monitored continuously by radiology nursing throughout the procedure under my direct supervision. FLUOROSCOPY TIME:  Sixty mGy reference air kerma COMPLICATIONS: None immediate. PROCEDURE: Informed written consent was obtained from the patient after a discussion of the risks, benefits, and alternatives to treatment. Questions regarding the procedure were encouraged and answered. Preprocedure ultrasound evaluation demonstrated occlusion of the right internal jugular vein. The left neck and chest were prepped with chlorhexidine  in a sterile fashion, and a sterile drape was applied covering the operative field. Maximum barrier sterile technique with sterile gowns and gloves were used for the procedure. A timeout was performed prior to the initiation of the procedure. After creating a small venotomy incision, a 21 gauge micropuncture kit was utilized to access the internal jugular vein. Real-time ultrasound guidance was utilized for vascular access including the acquisition of a permanent ultrasound image documenting patency of the accessed vessel. A Rosen wire was advanced to the level of the IVC and the micropuncture sheath was exchanged for an 8 Fr dilator. A 14.5 French tunneled hemodialysis catheter measuring 23 cm from tip to cuff was tunneled in a retrograde fashion from the anterior chest wall to the venotomy incision. Serial dilation was then performed an a peel-away sheath was placed. The catheter was then placed through the peel-away sheath with the catheter tip ultimately positioned within the right atrium. Final catheter positioning was confirmed and documented with a spot radiographic image. The catheter aspirates and flushes normally. The catheter was flushed with appropriate volume heparin  dwells. The catheter exit site was secured with a 0-Silk  retention suture. The venotomy incision was closed with Dermabond. Sterile dressings were applied. The patient tolerated the procedure well without immediate post procedural complication. IMPRESSION: Successful placement of 23 cm tip to cuff tunneled hemodialysis catheter via the left internal jugular vein with catheter tip terminating within the right atrium. The catheter is ready for immediate use. Ester Sides, MD [facility] Radiology Electronically Signed   By: Ester Sides M.D.   On: 04/23/2024 22:49   Medications:  [START ON 04/27/2024] vancomycin      vancomycin       (feeding supplement) PROSource Plus  30 mL Oral BID BM   apixaban   10 mg Oral BID   Followed by   NOREEN ON 05/01/2024] apixaban   5 mg Oral BID   vitamin C   1,000 mg Oral Daily   brimonidine   1 drop Both Eyes BID   Chlorhexidine  Gluconate Cloth  6 each Topical Q0600  insulin  aspart  0-5 Units Subcutaneous QHS   insulin  aspart  0-6 Units Subcutaneous TID WC   insulin  aspart  2 Units Subcutaneous TID WC   insulin  glargine-yfgn  5 Units Subcutaneous Daily   latanoprost   1 drop Both Eyes QHS   midodrine   10 mg Oral TID WC   nutrition supplement (JUVEN)  1 packet Oral BID BM   zinc  sulfate (50mg  elemental zinc )  220 mg Oral Daily    Dialysis Orders MWF - [facility] 4hr, 350/A1.5, EDW 69.5kg, 3K/2.5Ca bath, TDC, heparin  2500 unit bolus - Leaving under EDW - Mircera 100mcg Ivq 2 weeks (last 7/25) - calcitriol  0.64mcg PO q HD  Assessment/Plan: 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 S/p R BKA. On 8 week course of Vanc ESRD: Usual MWF schedule - delayed HD this week due to line holiday (7/29-7/31) did have short HD yesterday - for HD today to make-up and volume off. HypoTN/volume: BP stable on midodrine , does have edema - UF as tolerated today. R internal jugular DVT: On eliquis . 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 Secondary HPTH: CorrCa ok, Phos ok - resume calcitriol , not on binders Nutrition: Alb very low - adding protein supps T2DM: Insulin  per primary.    Izetta Boehringer, PA-C 04/25/2024, 9:30 AM  [employer]

## 2024-04-26 DIAGNOSIS — N186 End stage renal disease: Secondary | ICD-10-CM | POA: Diagnosis not present

## 2024-04-26 DIAGNOSIS — A419 Sepsis, unspecified organism: Secondary | ICD-10-CM | POA: Diagnosis not present

## 2024-04-26 DIAGNOSIS — M726 Necrotizing fasciitis: Secondary | ICD-10-CM | POA: Diagnosis not present

## 2024-04-26 DIAGNOSIS — R6521 Severe sepsis with septic shock: Secondary | ICD-10-CM | POA: Diagnosis not present

## 2024-04-26 LAB — GLUCOSE, CAPILLARY
Glucose-Capillary: 112 mg/dL — ABNORMAL HIGH (ref 70–99)
Glucose-Capillary: 128 mg/dL — ABNORMAL HIGH (ref 70–99)
Glucose-Capillary: 146 mg/dL — ABNORMAL HIGH (ref 70–99)
Glucose-Capillary: 190 mg/dL — ABNORMAL HIGH (ref 70–99)
Glucose-Capillary: 98 mg/dL (ref 70–99)

## 2024-04-26 LAB — CBC
HCT: 22.8 % — ABNORMAL LOW (ref 36.0–46.0)
Hemoglobin: 7.2 g/dL — ABNORMAL LOW (ref 12.0–15.0)
MCH: 25.2 pg — ABNORMAL LOW (ref 26.0–34.0)
MCHC: 31.6 g/dL (ref 30.0–36.0)
MCV: 79.7 fL — ABNORMAL LOW (ref 80.0–100.0)
Platelets: 97 K/uL — ABNORMAL LOW (ref 150–400)
RBC: 2.86 MIL/uL — ABNORMAL LOW (ref 3.87–5.11)
RDW: 19.1 % — ABNORMAL HIGH (ref 11.5–15.5)
WBC: 19.5 K/uL — ABNORMAL HIGH (ref 4.0–10.5)
nRBC: 0.2 % (ref 0.0–0.2)

## 2024-04-26 LAB — CULTURE, BLOOD (ROUTINE X 2)
Culture: NO GROWTH
Culture: NO GROWTH
Special Requests: ADEQUATE
Special Requests: ADEQUATE

## 2024-04-26 LAB — BASIC METABOLIC PANEL WITH GFR
Anion gap: 10 (ref 5–15)
BUN: 32 mg/dL — ABNORMAL HIGH (ref 8–23)
CO2: 25 mmol/L (ref 22–32)
Calcium: 7 mg/dL — ABNORMAL LOW (ref 8.9–10.3)
Chloride: 96 mmol/L — ABNORMAL LOW (ref 98–111)
Creatinine, Ser: 4.13 mg/dL — ABNORMAL HIGH (ref 0.44–1.00)
GFR, Estimated: 11 mL/min — ABNORMAL LOW (ref >60)
Glucose, Bld: 97 mg/dL (ref 70–99)
Potassium: 3.9 mmol/L (ref 3.5–5.1)
Sodium: 131 mmol/L — ABNORMAL LOW (ref 135–145)

## 2024-04-26 MED ORDER — SODIUM CHLORIDE 0.9% FLUSH: 10.0000 mL | INTRAVENOUS | Status: DC | PRN

## 2024-04-26 MED ORDER — INSULIN GLARGINE-YFGN 100 UNIT/ML ~~LOC~~ SOLN
5.0000 [IU] | Freq: Two times a day (BID) | SUBCUTANEOUS | Status: DC
  Administered 2024-04-26 – 2024-04-28 (×3): 5 [IU] via SUBCUTANEOUS
  Filled 2024-04-26 (×6): qty 0.05

## 2024-04-26 MED ORDER — SODIUM CHLORIDE 0.9% FLUSH
10.0000 mL | Freq: Two times a day (BID) | INTRAVENOUS | Status: DC
  Administered 2024-04-26 – 2024-04-28 (×4): 10 mL

## 2024-04-26 NOTE — Progress Notes (Signed)
 [facility] Progress Note   Subjective:  Seen in room - sisters at bedside. Nausea is less. HD went good yesterday - 3L off. Next HD will be tomorrow.  Objective Vitals:   04/25/24 1735 04/25/24 1740 04/25/24 1830 04/26/24 0805  BP: 111/65 (!) 119/58 108/64 111/60  Pulse: 74 69 66 70  Resp: 15 13 17 16   Temp: 97.7 F (36.5 C) 97.7 F (36.5 C) 97.8 F (36.6 C) 97.6 F (36.4 C)  TempSrc:   Oral   SpO2: 100% 100% 100% 100%  Weight:  74.3 kg    Height:       Physical Exam General: Well appearing woman, NAD. Room air.  Heart: RRR; no murmur Lungs: CTAB Abdomen: soft Extremities: 1+ BLE and BUE edema Dialysis Access: [facility] in R chest  Additional Objective Labs: Basic Metabolic Panel: Recent Labs  Lab 04/21/24 0520 04/22/24 0500 04/23/24 0852 04/24/24 0656 04/25/24 0500  NA 128* 126* 125* 129* 129*  127*  K 3.4* 3.4* 3.2* 3.1* 4.6  4.5  CL 95* 94* 95* 95* 94*  94*  CO2 22 21* 20* 25 23  23   GLUCOSE 124* 142* 62* 133* 262*  257*  BUN 58* 74* 85* 35* 50*  51*  CREATININE 6.86* 7.33* 8.26* 4.21* 5.42*  5.38*  CALCIUM  8.0* 7.6* 7.2* 7.1* 7.1*  6.9*  PHOS 4.2 4.3  --   --  3.5   Liver Function Tests: Recent Labs  Lab 04/19/24 1341 04/22/24 0500 04/25/24 0500  AST 13*  --   --   ALT 12  --   --   ALKPHOS 106  --   --   BILITOT 2.0*  --   --   PROT 5.4*  --   --   ALBUMIN  1.5* <1.5* <1.5*   CBC: Recent Labs  Lab 04/19/24 1341 04/20/24 0340 04/21/24 0520 04/22/24 0500 04/23/24 0852 04/25/24 0712  WBC 28.5* 30.2* 19.1* 17.8* 16.4* 19.2*  NEUTROABS 27.4*  --   --   --   --   --   HGB 9.4* 9.1* 7.8* 7.7* 7.5* 7.4*  HCT 29.2* 27.5* 23.7* 23.7* 23.5* 23.3*  MCV 82.7 79.5* 78.7* 79.5* 80.2 80.3  PLT 225 244 156 194 136* 83*   Blood Culture    Component Value Date/Time   SDES BLOOD BLOOD RIGHT ARM 04/21/2024 0621   SPECREQUEST  04/21/2024 0621    BOTTLES DRAWN AEROBIC AND ANAEROBIC Blood Culture adequate volume   CULT  04/21/2024  0621    NO GROWTH 4 DAYS Performed at [facility], [address], [place], [place] [address]    REPTSTATUS PENDING 04/21/2024 9378   Studies/Results: VAS US  UPPER EXTREMITY VENOUS DUPLEX Result Date: 04/24/2024 UPPER VENOUS STUDY  Patient Name:  [name] [facility]  Date of Exam:   04/24/2024 Medical Rec #: [id number]          Accession #:    [id number] Date of Birth: 07-22-1956          Patient Gender: F Patient Age:   68 years Exam Location:  [facility] Procedure:      VAS US  UPPER EXTREMITY VENOUS DUPLEX Referring Phys: MATTHEW HUNSUCKER --------------------------------------------------------------------------------  Indications: Erythema and edema medial right forearm Limitations: Bandages and line. Performing Technologist: Ricka Sturdivant-Jones RDMS, RVT  Examination Guidelines: A complete evaluation includes B-mode imaging, spectral Doppler, color Doppler, and power Doppler as needed of all accessible portions of each vessel. Bilateral testing is considered an integral part of a  complete examination. Limited examinations for reoccurring indications may be performed as noted.  Right Findings: +----------+------------+---------+-----------+----------+-------+ RIGHT     CompressiblePhasicitySpontaneousPropertiesSummary +----------+------------+---------+-----------+----------+-------+ IJV           None       No        No                Acute  +----------+------------+---------+-----------+----------+-------+ Subclavian               Yes       Yes                      +----------+------------+---------+-----------+----------+-------+ Axillary      Full       Yes       Yes                      +----------+------------+---------+-----------+----------+-------+ Brachial      Full                                          +----------+------------+---------+-----------+----------+-------+ Radial        Full                                           +----------+------------+---------+-----------+----------+-------+ Ulnar         Full                                          +----------+------------+---------+-----------+----------+-------+ Cephalic    Partial                                  Acute  +----------+------------+---------+-----------+----------+-------+ Basilic       Full                                          +----------+------------+---------+-----------+----------+-------+ Poor visualization of the right subclavian vein due to tape/bandages. Distal segment appears patent.  Left Findings: +----------+------------+---------+-----------+----------+-------+ LEFT      CompressiblePhasicitySpontaneousPropertiesSummary +----------+------------+---------+-----------+----------+-------+ IJV           Full       Yes       Yes                      +----------+------------+---------+-----------+----------+-------+ Subclavian    Full       Yes       Yes                      +----------+------------+---------+-----------+----------+-------+ Axillary      Full       Yes       Yes                      +----------+------------+---------+-----------+----------+-------+ Brachial      Full                                          +----------+------------+---------+-----------+----------+-------+  Radial        Full                                          +----------+------------+---------+-----------+----------+-------+ Ulnar         Full                                          +----------+------------+---------+-----------+----------+-------+ Cephalic      Full                                          +----------+------------+---------+-----------+----------+-------+ Basilic       Full                                          +----------+------------+---------+-----------+----------+-------+  Summary:  Right: Findings consistent with acute deep vein thrombosis involving the right  internal jugular vein. Findings consistent with acute superficial vein thrombosis involving the right cephalic vein at the antecubital location.  Left: No evidence of deep vein thrombosis in the upper extremity. No evidence of superficial vein thrombosis in the upper extremity.  *See table(s) above for measurements and observations.  Diagnosing physician: Debby Robertson Electronically signed by Debby Robertson on 04/24/2024 at 4:51:39 PM.    Final    Medications:  [START ON 04/27/2024] vancomycin       (feeding supplement) PROSource Plus  30 mL Oral BID BM   apixaban   10 mg Oral BID   Followed by   NOREEN ON 05/01/2024] apixaban   5 mg Oral BID   vitamin C   1,000 mg Oral Daily   brimonidine   1 drop Both Eyes BID   [START ON 04/27/2024] calcitRIOL   0.25 mcg Oral Q M,W,F-1800   Chlorhexidine  Gluconate Cloth  6 each Topical Q0600   insulin  aspart  0-5 Units Subcutaneous QHS   insulin  aspart  0-6 Units Subcutaneous TID WC   insulin  aspart  2 Units Subcutaneous TID WC   insulin  glargine-yfgn  5 Units Subcutaneous BID   latanoprost   1 drop Both Eyes QHS   midodrine   10 mg Oral TID WC   nutrition supplement (JUVEN)  1 packet Oral BID BM   sodium chloride  flush  10-40 mL Intracatheter Q12H   zinc  sulfate (50mg  elemental zinc )  220 mg Oral Daily    Dialysis Orders MWF - [facility] 4hr, 350/A1.5, EDW 69.5kg, 3K/2.5Ca bath, TDC, heparin  2500 unit bolus - Leaving under EDW - Mircera 100mcg Ivq 2 weeks (last 7/25) - calcitriol  0.25mcg PO q HD   Assessment/Plan: 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 S/p R BKA. On 8 week course of Vanc ESRD: Usual MWF schedule - delayed HD this week due to line holiday (7/29-7/31), then HD 8/1, 8/2. Next HD tomorrow. HypoTN/volume: BP stable on midodrine , does have edema - UF as tolerated today. R internal jugular DVT: On eliquis . Anemia of ESRD: Hgb 7.4 - not due for ESA yet, transfuse if < 7. Secondary HPTH: CorrCa ok, Phos ok - have  resumed calcitriol , not on binders Nutrition: Alb very low - continue protein supplements. T2DM: Insulin  per  primary.    Izetta Boehringer, PA-C 04/26/2024, 8:46 AM  [employer]

## 2024-04-26 NOTE — Progress Notes (Signed)
 [facility] PROGRESS NOTE    Progress Note  [name]  FMW:[id number] DOB: [birth year] DOA: 04/19/2024 PCP: Campbell Reynolds, NP     Brief Narrative:   [name] is an 68 y.o. 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 +MRSA + Strept Agalactiae blood cultures  7/29 : TDC removed lab holiday 7/30 : Eliquis  held 7/31: TDC replaced 8/1: HD session today    Assessment/Plan:   Septic shock (HCC)/MRSA/group B streptococcus bacteremia in the setting of necrotizing fasciitis of the left lower extremity and right lower extremity cellulitis: Patient did not require pressors.  Status post line holiday 04/19/2024 blood cultures grew MRSA and streptococcal cacti sensitive to IV vancomycin . Orthopedic surgery was consulted and he status post right BKA on 04/20/2024. ID was consulted and the plan is to continue IV vancomycin  for 8 weeks with an end day of treatment of 06/15/2024, antibiotics to be given during dialysis. PT evaluated, awaiting skilled nursing facility placement  End-stage renal disease on hemodialysis: Nephrology on board, TDC removed on 04/21/2024, replaced by IR on 04/23/2024. Eliquis  was resumed on 04/24/2024. Has return to his Monday Wednesday and Friday schedule. Continue midodrine  3 times a day  Uncontrolled diabetes mellitus type 2 with hyperglycemia: Long-acting insulin  per sliding scale blood glucose well-controlled this morning.  History of paroxysmal atrial fibrillation: In sinus rhythm, Coreg  held. Eliquis  full dose.  Right internal jugular with  clotted: Cont Eliquis  for DVT. Doppler of the right lower extremity to rule out central line showed an acute DVT involving the right internal jugular  Normocytic anemia/anemia of chronic disease/thrombocytopenia: Platelets were slightly lower on 04/23/2020 from repeat CBC today.   Received IV iron  on 04/17/2024 Transfuse as needed if hemoglobin less than 7 or symptomatic.  Vascular access: TCD nonfunctioning discontinued and replaced by IR on 04/23/2024.   DVT prophylaxis: Eliquis  Family Communication:none Status is: Inpatient Remains inpatient appropriate because: Awaiting skilled nursing facility placement    Code Status:     Code Status Orders  (From admission, onward)           Start     Ordered   04/19/24 1811  Do not attempt resuscitation (DNR)- Limited -Do Not Intubate (DNI)  (Code Status)  Continuous       Question Answer Comment  If pulseless and not breathing No CPR or chest compressions.   In Pre-Arrest Conditions (Patient Is Breathing and Has A Pulse) Do not intubate. Provide all appropriate non-invasive medical interventions. Avoid ICU transfer unless indicated or required.   Consent: Discussion documented in EHR or advanced directives reviewed      04/19/24 1810           Code Status History     Date Active Date Inactive Code Status Order ID Comments User Context   04/19/2024 1807 04/19/2024 1810 Full Code [id number]  Jude Harden GAILS, MD ED   11/07/2023 1641 11/25/2023 2255 Limited: Do not attempt resuscitation (DNR) -DNR-LIMITED -Do Not Intubate/DNI  [id number]  Rodman Recardo NOVAK, NP Inpatient   11/01/2023 1312 11/07/2023 1641 Full Code [id number]  Raenelle Donalda HERO, MD Inpatient   11/01/2023 1016 11/01/2023 1312 Do not attempt  resuscitation (DNR) - Comfort care [id number]  Raenelle Donalda HERO, MD Inpatient   10/31/2023 2212 11/01/2023 1016 Limited: Do not attempt resuscitation (DNR) -DNR-LIMITED -Do Not Intubate/DNI  [id number]  Rodman Recardo NOVAK, NP Inpatient   10/27/2023  2251 10/31/2023 2212 Do not attempt resuscitation (DNR) PRE-ARREST INTERVENTIONS DESIRED [id number]  Keturah Carrier, MD ED   10/27/2023 2224 10/27/2023 2251 Full Code [id number]  Keturah Carrier, MD ED   02/13/2013 1824 02/15/2013 1410 Full Code [id number]  Harden Jerona GAILS, MD Inpatient   01/12/2013 1954 01/15/2013 1812 Full Code [id number]  Harden Jerona GAILS, MD Inpatient   01/08/2013 2129 01/12/2013 1954 Full Code [id number]  Billy [place] GAILS, MD Inpatient      Advance Directive Documentation    Flowsheet Row Most Recent Value  Type of Advance Directive Out of facility DNR (pink MOST or yellow form)  Pre-existing out of facility DNR order (yellow form or pink MOST form) --  MOST Form in Place? --      IV Access:   Peripheral IV   Procedures and diagnostic studies:   VAS US  UPPER EXTREMITY VENOUS DUPLEX Result Date: 04/24/2024 UPPER VENOUS STUDY  Patient Name:  [name] [facility]  Date of Exam:   04/24/2024 Medical Rec #: [id number]          Accession #:    [id number] Date of Birth: [birth year]          Patient Gender: F Patient Age:   29 years Exam Location:  [facility] Procedure:      VAS US  UPPER EXTREMITY VENOUS DUPLEX Referring Phys: MATTHEW HUNSUCKER --------------------------------------------------------------------------------  Indications: Erythema and edema medial right forearm Limitations: Bandages and line. Performing Technologist: Ricka Sturdivant-Jones RDMS, RVT  Examination Guidelines: A complete evaluation includes B-mode imaging, spectral Doppler, color Doppler, and power Doppler as needed of all accessible portions of each vessel. Bilateral testing is considered an integral part of a complete examination. Limited examinations for reoccurring indications may be performed as noted.  Right Findings: +----------+------------+---------+-----------+----------+-------+ RIGHT     CompressiblePhasicitySpontaneousPropertiesSummary  +----------+------------+---------+-----------+----------+-------+ IJV           None       No        No                Acute  +----------+------------+---------+-----------+----------+-------+ Subclavian               Yes       Yes                      +----------+------------+---------+-----------+----------+-------+ Axillary      Full       Yes       Yes                      +----------+------------+---------+-----------+----------+-------+ Brachial      Full                                          +----------+------------+---------+-----------+----------+-------+ Radial        Full                                          +----------+------------+---------+-----------+----------+-------+ Ulnar         Full                                          +----------+------------+---------+-----------+----------+-------+  Cephalic    Partial                                  Acute  +----------+------------+---------+-----------+----------+-------+ Basilic       Full                                          +----------+------------+---------+-----------+----------+-------+ Poor visualization of the right subclavian vein due to tape/bandages. Distal segment appears patent.  Left Findings: +----------+------------+---------+-----------+----------+-------+ LEFT      CompressiblePhasicitySpontaneousPropertiesSummary +----------+------------+---------+-----------+----------+-------+ IJV           Full       Yes       Yes                      +----------+------------+---------+-----------+----------+-------+ Subclavian    Full       Yes       Yes                      +----------+------------+---------+-----------+----------+-------+ Axillary      Full       Yes       Yes                      +----------+------------+---------+-----------+----------+-------+ Brachial      Full                                           +----------+------------+---------+-----------+----------+-------+ Radial        Full                                          +----------+------------+---------+-----------+----------+-------+ Ulnar         Full                                          +----------+------------+---------+-----------+----------+-------+ Cephalic      Full                                          +----------+------------+---------+-----------+----------+-------+ Basilic       Full                                          +----------+------------+---------+-----------+----------+-------+  Summary:  Right: Findings consistent with acute deep vein thrombosis involving the right internal jugular vein. Findings consistent with acute superficial vein thrombosis involving the right cephalic vein at the antecubital location.  Left: No evidence of deep vein thrombosis in the upper extremity. No evidence of superficial vein thrombosis in the upper extremity.  *See table(s) above for measurements and observations.  Diagnosing physician: Debby Robertson Electronically signed by Debby Robertson on 04/24/2024 at 4:51:39 PM.    Final      Medical Consultants:   None.   Subjective:    Connie  L King no complaints today.  Objective:    Vitals:   04/25/24 1730 04/25/24 1735 04/25/24 1740 04/25/24 1830  BP: 96/63 111/65 (!) 119/58 108/64  Pulse: 77 74 69 66  Resp: (!) 7 15 13 17   Temp:  97.7 F (36.5 C) 97.7 F (36.5 C) 97.8 F (36.6 C)  TempSrc:    Oral  SpO2: 94% 100% 100% 100%  Weight:   74.3 kg   Height:       SpO2: 100 % O2 Flow Rate (L/min): 2 L/min   Intake/Output Summary (Last 24 hours) at 04/26/2024 0703 Last data filed at 04/25/2024 2200 Gross per 24 hour  Intake 240 ml  Output 3125 ml  Net -2885 ml   Filed Weights   04/24/24 0645 04/25/24 1349 04/25/24 1740  Weight: 73 kg 74.8 kg 74.3 kg    Exam: General exam: In no acute distress. Respiratory system: Good air movement and  clear to auscultation. Cardiovascular system: S1 & S2 heard, RRR. [name]. Gastrointestinal system: Abdomen is nondistended, soft and nontender.  Extremities: Right below the knee amputation Skin: No rashes, lesions or ulcers Psychiatry: Judgement and insight appear normal. Mood & affect appropriate.  Data Reviewed:    Labs: Basic Metabolic Panel: Recent Labs  Lab 04/20/24 0340 04/21/24 0520 04/22/24 0500 04/23/24 0852 04/24/24 0656 04/25/24 0500  NA 128* 128* 126* 125* 129* 129*  127*  K 3.3* 3.4* 3.4* 3.2* 3.1* 4.6  4.5  CL 92* 95* 94* 95* 95* 94*  94*  CO2 21* 22 21* 20* 25 23  23   GLUCOSE 374* 124* 142* 62* 133* 262*  257*  BUN 55* 58* 74* 85* 35* 50*  51*  CREATININE 6.89* 6.86* 7.33* 8.26* 4.21* 5.42*  5.38*  CALCIUM  8.1* 8.0* 7.6* 7.2* 7.1* 7.1*  6.9*  MG 1.9 2.0  --   --   --  1.8  PHOS 3.9 4.2 4.3  --   --  3.5   GFR Estimated Creatinine Clearance: 10.5 mL/min (A) (by C-G formula based on SCr of 5.42 mg/dL (H)). Liver Function Tests: Recent Labs  Lab 04/19/24 1341 04/22/24 0500 04/25/24 0500  AST 13*  --   --   ALT 12  --   --   ALKPHOS 106  --   --   BILITOT 2.0*  --   --   PROT 5.4*  --   --   ALBUMIN  1.5* <1.5* <1.5*   No results for input(s): LIPASE, AMYLASE in the last 168 hours. No results for input(s): AMMONIA in the last 168 hours. Coagulation profile Recent Labs  Lab 04/19/24 1341  INR 1.1   COVID-19 Labs  No results for input(s): DDIMER, FERRITIN, LDH, CRP in the last 72 hours.  Lab Results  Component Value Date   SARSCOV2NAA NEGATIVE 04/19/2024   SARSCOV2NAA NEGATIVE 10/27/2023    CBC: Recent Labs  Lab 04/19/24 1341 04/20/24 0340 04/21/24 0520 04/22/24 0500 04/23/24 0852 04/25/24 0712  WBC 28.5* 30.2* 19.1* 17.8* 16.4* 19.2*  NEUTROABS 27.4*  --   --   --   --   --   HGB 9.4* 9.1* 7.8* 7.7* 7.5* 7.4*  HCT 29.2* 27.5* 23.7* 23.7* 23.5* 23.3*  MCV 82.7 79.5* 78.7* 79.5* 80.2 80.3  PLT 225 244 156 194  136* 83*   Cardiac Enzymes: No results for input(s): CKTOTAL, CKMB, CKMBINDEX, TROPONINI in the last 168 hours. BNP (last 3 results) No results for input(s): PROBNP in the last 8760 hours. CBG: Recent Labs  Lab 04/25/24 0127 04/25/24 0738 04/25/24 1110 04/25/24 1824 04/26/24 0015  GLUCAP 293* 252* 226* 143* 128*   D-Dimer: No results for input(s): DDIMER in the last 72 hours. Hgb A1c: No results for input(s): HGBA1C in the last 72 hours. Lipid Profile: No results for input(s): CHOL, HDL, LDLCALC, TRIG, CHOLHDL, LDLDIRECT in the last 72 hours. Thyroid  function studies: No results for input(s): TSH, T4TOTAL, T3FREE, THYROIDAB in the last 72 hours.  Invalid input(s): FREET3 Anemia work up: No results for input(s): VITAMINB12, FOLATE, FERRITIN, TIBC, IRON , RETICCTPCT in the last 72 hours. Sepsis Labs: Recent Labs  Lab 04/19/24 1409 04/19/24 2016 04/20/24 0340 04/21/24 0520 04/22/24 0500 04/23/24 0852 04/25/24 0712  WBC  --   --    < > 19.1* 17.8* 16.4* 19.2*  LATICACIDVEN 2.6* 1.5  --   --   --   --   --    < > = values in this interval not displayed.   Microbiology Recent Results (from the past 240 hours)  Resp panel by RT-PCR (RSV, Flu A&B, Covid) Anterior Nasal Swab     Status: None   Collection Time: 04/19/24  1:41 PM   Specimen: Anterior Nasal Swab  Result Value Ref Range Status   SARS Coronavirus 2 by RT PCR NEGATIVE NEGATIVE Final   Influenza A by PCR NEGATIVE NEGATIVE Final   Influenza B by PCR NEGATIVE NEGATIVE Final    Comment: (NOTE) The Xpert Xpress SARS-CoV-2/FLU/RSV plus assay is intended as an aid in the diagnosis of influenza from Nasopharyngeal swab specimens and should not be used as a sole basis for treatment. Nasal washings and aspirates are unacceptable for Xpert Xpress SARS-CoV-2/FLU/RSV testing.  Fact Sheet for Patients: BloggerCourse.com  Fact Sheet for Healthcare  Providers: SeriousBroker.it  This test is not yet approved or cleared by the [place]  FDA and has been authorized for detection and/or diagnosis of SARS-CoV-2 by FDA under an Emergency Use Authorization (EUA). This EUA will remain in effect (meaning this test can be used) for the duration of the COVID-19 declaration under Section 564(b)(1) of the Act, 21 U.S.C. section 360bbb-3(b)(1), unless the authorization is terminated or revoked.     Resp Syncytial Virus by PCR NEGATIVE NEGATIVE Final    Comment: (NOTE) Fact Sheet for Patients: BloggerCourse.com  Fact Sheet for Healthcare Providers: SeriousBroker.it  This test is not yet approved or cleared by the [place]  FDA and has been authorized for detection and/or diagnosis of SARS-CoV-2 by FDA under an Emergency Use Authorization (EUA). This EUA will remain in effect (meaning this test can be used) for the duration of the COVID-19 declaration under Section 564(b)(1) of the Act, 21 U.S.C. section 360bbb-3(b)(1), unless the authorization is terminated or revoked.  Performed at [facility], [address], [place], [place] [address]   Blood Culture (routine x 2)     Status: Abnormal   Collection Time: 04/19/24  1:41 PM   Specimen: BLOOD  Result Value Ref Range Status   Specimen Description BLOOD LEFT ANTECUBITAL  Final   Special Requests   Final    BOTTLES DRAWN AEROBIC AND ANAEROBIC Blood Culture results may not be optimal due to an inadequate volume of blood received in culture bottles   Culture  Setup Time   Final    GRAM POSITIVE COCCI IN BOTH AEROBIC AND ANAEROBIC BOTTLES CRITICAL VALUE NOTED.  VALUE IS CONSISTENT WITH PREVIOUSLY REPORTED AND CALLED VALUE. Performed at [facility], [address] [place], [place]  [address]    Culture (A)  Final    STAPHYLOCOCCUS AUREUS SUSCEPTIBILITIES PERFORMED ON PREVIOUS CULTURE WITHIN THE  LAST 5 DAYS. GROUP B STREP(S.AGALACTIAE)ISOLATED    Report Status 04/23/2024 FINAL  Final   Organism ID, Bacteria GROUP B STREP(S.AGALACTIAE)ISOLATED  Final      Susceptibility   Group b strep(s.agalactiae)isolated - MIC*    CLINDAMYCIN  >=1 RESISTANT Resistant     AMPICILLIN <=0.25 SENSITIVE Sensitive     ERYTHROMYCIN  >=8 RESISTANT Resistant     VANCOMYCIN  0.5 SENSITIVE Sensitive     CEFTRIAXONE <=0.12 SENSITIVE Sensitive     LEVOFLOXACIN 1 SENSITIVE Sensitive     PENICILLIN <=0.06 SENSITIVE Sensitive     * GROUP B STREP(S.AGALACTIAE)ISOLATED  Blood Culture (routine x 2)     Status: Abnormal   Collection Time: 04/19/24  1:46 PM   Specimen: BLOOD  Result Value Ref Range Status   Specimen Description BLOOD RIGHT ANTECUBITAL  Final   Special Requests   Final    BOTTLES DRAWN AEROBIC AND ANAEROBIC Blood Culture results may not be optimal due to an inadequate volume of blood received in culture bottles   Culture  Setup Time   Final    GRAM POSITIVE COCCI IN BOTH AEROBIC AND ANAEROBIC BOTTLES CRITICAL RESULT CALLED TO, READ BACK BY AND VERIFIED WITH: PHARMD G ABBOTT 04/20/2024 @ 0539 BY AB    Culture (A)  Final    METHICILLIN RESISTANT STAPHYLOCOCCUS AUREUS GBS REPORTED FROM BCID NOT RECOVERED IN CULTURE Performed at [facility], [address], [place], [place] [address]    Report Status 04/25/2024 FINAL  Final   Organism ID, Bacteria METHICILLIN RESISTANT STAPHYLOCOCCUS AUREUS  Final      Susceptibility   Methicillin resistant staphylococcus aureus - MIC*    CIPROFLOXACIN  >=8 RESISTANT Resistant     ERYTHROMYCIN  >=8 RESISTANT Resistant     GENTAMICIN  <=0.5 SENSITIVE Sensitive     OXACILLIN >=4 RESISTANT Resistant     TETRACYCLINE <=1 SENSITIVE Sensitive     VANCOMYCIN  1 SENSITIVE Sensitive     TRIMETH/SULFA <=10 SENSITIVE Sensitive     CLINDAMYCIN  >=8 RESISTANT Resistant     RIFAMPIN <=0.5 SENSITIVE Sensitive     Inducible Clindamycin  NEGATIVE Sensitive     LINEZOLID  2 SENSITIVE Sensitive     * METHICILLIN RESISTANT STAPHYLOCOCCUS AUREUS  Blood Culture ID Panel (Reflexed)     Status: Abnormal   Collection Time: 04/19/24  1:46 PM  Result Value Ref Range Status   Enterococcus faecalis NOT DETECTED NOT DETECTED Final   Enterococcus Faecium NOT DETECTED NOT DETECTED Final   Listeria monocytogenes NOT DETECTED NOT DETECTED Final   Staphylococcus species DETECTED (A) NOT DETECTED Final    Comment: CRITICAL RESULT CALLED TO, READ BACK BY AND VERIFIED WITH: PHARMD G ABBOTT 04/20/2024 @ 0539 BY AB    Staphylococcus aureus (BCID) DETECTED (A) NOT DETECTED Final    Comment: Methicillin (oxacillin)-resistant Staphylococcus aureus (MRSA). MRSA is predictably resistant to beta-lactam antibiotics (except ceftaroline). Preferred therapy is vancomycin  unless clinically contraindicated. Patient requires contact precautions if  hospitalized. CRITICAL RESULT CALLED TO, READ BACK BY AND VERIFIED WITH: PHARMD G ABBOTT 04/20/2024 @ 0539 BY AB    Staphylococcus epidermidis NOT DETECTED NOT DETECTED Final   Staphylococcus lugdunensis NOT DETECTED NOT DETECTED Final   Streptococcus species DETECTED (A) NOT DETECTED Final    Comment: CRITICAL RESULT CALLED TO, READ BACK BY AND VERIFIED WITH: PHARMD G ABBOTT 04/20/2024 @ 0539 BY AB    Streptococcus agalactiae DETECTED (  A) NOT DETECTED Final    Comment: CRITICAL RESULT CALLED TO, READ BACK BY AND VERIFIED WITH: PHARMD G ABBOTT 04/20/2024 @ 0539 BY AB    Streptococcus pneumoniae NOT DETECTED NOT DETECTED Final   Streptococcus pyogenes NOT DETECTED NOT DETECTED Final   A.calcoaceticus-baumannii NOT DETECTED NOT DETECTED Final   Bacteroides fragilis NOT DETECTED NOT DETECTED Final   Enterobacterales NOT DETECTED NOT DETECTED Final   Enterobacter cloacae complex NOT DETECTED NOT DETECTED Final   Escherichia coli NOT DETECTED NOT DETECTED Final   Klebsiella aerogenes NOT DETECTED NOT DETECTED Final   Klebsiella oxytoca NOT  DETECTED NOT DETECTED Final   Klebsiella pneumoniae NOT DETECTED NOT DETECTED Final   Proteus species NOT DETECTED NOT DETECTED Final   Salmonella species NOT DETECTED NOT DETECTED Final   Serratia marcescens NOT DETECTED NOT DETECTED Final   Haemophilus influenzae NOT DETECTED NOT DETECTED Final   Neisseria meningitidis NOT DETECTED NOT DETECTED Final   Pseudomonas aeruginosa NOT DETECTED NOT DETECTED Final   Stenotrophomonas maltophilia NOT DETECTED NOT DETECTED Final   Candida albicans NOT DETECTED NOT DETECTED Final   Candida auris NOT DETECTED NOT DETECTED Final   Candida glabrata NOT DETECTED NOT DETECTED Final   Candida krusei NOT DETECTED NOT DETECTED Final   Candida parapsilosis NOT DETECTED NOT DETECTED Final   Candida tropicalis NOT DETECTED NOT DETECTED Final   Cryptococcus neoformans/gattii NOT DETECTED NOT DETECTED Final   Meth resistant mecA/C and MREJ DETECTED (A) NOT DETECTED Final    Comment: CRITICAL RESULT CALLED TO, READ BACK BY AND VERIFIED WITH: PHARMD G ABBOTT 04/20/2024 @ 0539 BY AB Performed at [facility], [address], [place], [place] [address]   MRSA Next Gen by PCR, Nasal     Status: None   Collection Time: 04/19/24  7:17 PM   Specimen: Nasal Mucosa; Nasal Swab  Result Value Ref Range Status   MRSA by PCR Next Gen NOT DETECTED NOT DETECTED Final    Comment: (NOTE) The GeneXpert MRSA Assay (FDA approved for NASAL specimens only), is one component of a comprehensive MRSA colonization surveillance program. It is not intended to diagnose MRSA infection nor to guide or monitor treatment for MRSA infections. Test performance is not FDA approved in patients less than 33 years old. Performed at [facility], [address] [place], [place] [address]   Aerobic/Anaerobic Culture w Gram Stain (surgical/deep wound)     Status: None   Collection Time: 04/20/24  6:22 PM   Specimen: Soft Tissue, Other  Result Value Ref Range Status   Specimen  Description TISSUE  Final   Special Requests RIGHT LEG  Final   Gram Stain NO WBC SEEN NO ORGANISMS SEEN   Final   Culture   Final    No growth aerobically or anaerobically. Performed at [facility], [address], [place], [place] [address]    Report Status 04/25/2024 FINAL  Final  Culture, blood (Routine X 2) w Reflex to ID Panel     Status: None (Preliminary result)   Collection Time: 04/21/24  6:16 AM   Specimen: BLOOD  Result Value Ref Range Status   Specimen Description BLOOD BLOOD LEFT HAND  Final   Special Requests   Final    BOTTLES DRAWN AEROBIC AND ANAEROBIC Blood Culture adequate volume   Culture   Final    NO GROWTH 4 DAYS Performed at [facility], [address] [place], [place] [address]  Report Status PENDING  Incomplete  Culture, blood (Routine X 2) w Reflex to ID Panel     Status: None (Preliminary result)   Collection Time: 04/21/24  6:21 AM   Specimen: BLOOD  Result Value Ref Range Status   Specimen Description BLOOD BLOOD RIGHT ARM  Final   Special Requests   Final    BOTTLES DRAWN AEROBIC AND ANAEROBIC Blood Culture adequate volume   Culture   Final    NO GROWTH 4 DAYS Performed at [facility], [address], [place], [place] [address]    Report Status PENDING  Incomplete     Medications:    (feeding supplement) PROSource Plus  30 mL Oral BID BM   apixaban   10 mg Oral BID   Followed by   NOREEN ON 05/01/2024] apixaban   5 mg Oral BID   vitamin C   1,000 mg Oral Daily   brimonidine   1 drop Both Eyes BID   [START ON 04/27/2024] calcitRIOL   0.25 mcg Oral Q M,W,F-1800   Chlorhexidine  Gluconate Cloth  6 each Topical Q0600   insulin  aspart  0-5 Units Subcutaneous QHS   insulin  aspart  0-6 Units Subcutaneous TID WC   insulin  aspart  2 Units Subcutaneous TID WC   insulin  glargine-yfgn  5 Units Subcutaneous Daily   latanoprost   1 drop Both Eyes QHS   midodrine   10 mg Oral TID WC   nutrition supplement (JUVEN)  1 packet Oral BID BM    zinc  sulfate (50mg  elemental zinc )  220 mg Oral Daily   Continuous Infusions:  [START ON 04/27/2024] vancomycin         LOS: 7 days   [name]  [facility]  04/26/2024, 7:03 AM

## 2024-04-26 NOTE — Plan of Care (Signed)
   Problem: Education: Goal: Knowledge of General Education information will improve Description Including pain rating scale, medication(s)/side effects and non-pharmacologic comfort measures Outcome: Progressing   Problem: Health Behavior/Discharge Planning: Goal: Ability to manage health-related needs will improve Outcome: Progressing

## 2024-04-27 ENCOUNTER — Telehealth: Payer: Self-pay | Admitting: Nurse Practitioner

## 2024-04-27 DIAGNOSIS — R6521 Severe sepsis with septic shock: Secondary | ICD-10-CM | POA: Diagnosis not present

## 2024-04-27 DIAGNOSIS — A419 Sepsis, unspecified organism: Secondary | ICD-10-CM | POA: Diagnosis not present

## 2024-04-27 DIAGNOSIS — N186 End stage renal disease: Secondary | ICD-10-CM | POA: Diagnosis not present

## 2024-04-27 DIAGNOSIS — M726 Necrotizing fasciitis: Secondary | ICD-10-CM | POA: Diagnosis not present

## 2024-04-27 LAB — CBC
HCT: 22 % — ABNORMAL LOW (ref 36.0–46.0)
Hemoglobin: 7.1 g/dL — ABNORMAL LOW (ref 12.0–15.0)
MCH: 25.9 pg — ABNORMAL LOW (ref 26.0–34.0)
MCHC: 32.3 g/dL (ref 30.0–36.0)
MCV: 80.3 fL (ref 80.0–100.0)
Platelets: 124 K/uL — ABNORMAL LOW (ref 150–400)
RBC: 2.74 MIL/uL — ABNORMAL LOW (ref 3.87–5.11)
RDW: 19.2 % — ABNORMAL HIGH (ref 11.5–15.5)
WBC: 20.7 K/uL — ABNORMAL HIGH (ref 4.0–10.5)
nRBC: 0.1 % (ref 0.0–0.2)

## 2024-04-27 LAB — BASIC METABOLIC PANEL WITH GFR
Anion gap: 9 (ref 5–15)
BUN: 43 mg/dL — ABNORMAL HIGH (ref 8–23)
CO2: 25 mmol/L (ref 22–32)
Calcium: 7.1 mg/dL — ABNORMAL LOW (ref 8.9–10.3)
Chloride: 95 mmol/L — ABNORMAL LOW (ref 98–111)
Creatinine, Ser: 4.84 mg/dL — ABNORMAL HIGH (ref 0.44–1.00)
GFR, Estimated: 9 mL/min — ABNORMAL LOW (ref >60)
Glucose, Bld: 189 mg/dL — ABNORMAL HIGH (ref 70–99)
Potassium: 4.4 mmol/L (ref 3.5–5.1)
Sodium: 129 mmol/L — ABNORMAL LOW (ref 135–145)

## 2024-04-27 LAB — GLUCOSE, CAPILLARY
Glucose-Capillary: 255 mg/dL — ABNORMAL HIGH (ref 70–99)
Glucose-Capillary: 236 mg/dL — ABNORMAL HIGH (ref 70–99)
Glucose-Capillary: 205 mg/dL — ABNORMAL HIGH (ref 70–99)

## 2024-04-27 LAB — PHOSPHORUS: Phosphorus: 2.9 mg/dL (ref 2.5–4.6)

## 2024-04-27 MED ORDER — HEPARIN SODIUM (PORCINE) 1000 UNIT/ML DIALYSIS
2000.0000 [IU] | Freq: Once | INTRAMUSCULAR | Status: AC
  Administered 2024-04-27: 2000 [IU] via INTRAVENOUS_CENTRAL

## 2024-04-27 MED ORDER — ALTEPLASE 2 MG IJ SOLR: 2.0000 mg | Freq: Once | INTRAMUSCULAR | Status: DC | PRN

## 2024-04-27 MED ORDER — OXYCODONE HCL 5 MG PO TABS: 5.0000 mg | ORAL_TABLET | Freq: Four times a day (QID) | ORAL | 0 refills | Status: DC | PRN

## 2024-04-27 MED ORDER — HEPARIN SODIUM (PORCINE) 1000 UNIT/ML DIALYSIS
1000.0000 [IU] | INTRAMUSCULAR | Status: DC | PRN
  Administered 2024-04-27: 1000 [IU]

## 2024-04-27 MED ORDER — HEPARIN SODIUM (PORCINE) 1000 UNIT/ML IJ SOLN
INTRAMUSCULAR | Status: AC
Start: 2024-04-27 — End: 2024-04-27
  Filled 2024-04-27: qty 4

## 2024-04-27 MED ORDER — HEPARIN SODIUM (PORCINE) 1000 UNIT/ML IJ SOLN
INTRAMUSCULAR | Status: AC
  Filled 2024-04-27: qty 2

## 2024-04-27 MED ORDER — MIDODRINE HCL 5 MG PO TABS
ORAL_TABLET | ORAL | Status: AC
  Filled 2024-04-27: qty 2

## 2024-04-27 MED ORDER — ALTEPLASE 2 MG IJ SOLR
2.0000 mg | Freq: Once | INTRAMUSCULAR | Status: AC
  Administered 2024-04-28: 2 mg
  Filled 2024-04-27: qty 2

## 2024-04-27 MED ORDER — VANCOMYCIN HCL 750 MG/150ML IV SOLN
INTRAVENOUS | Status: AC
Start: 2024-04-27 — End: 2024-04-27
  Filled 2024-04-27: qty 150

## 2024-04-27 NOTE — Plan of Care (Signed)
   Problem: Education: Goal: Knowledge of General Education information will improve Description Including pain rating scale, medication(s)/side effects and non-pharmacologic comfort measures Outcome: Progressing   Problem: Clinical Measurements: Goal: Ability to maintain clinical measurements within normal limits will improve Outcome: Progressing   Problem: Clinical Measurements: Goal: Will remain free from infection Outcome: Progressing

## 2024-04-27 NOTE — Progress Notes (Signed)
   04/27/24 1201  Vitals  Temp (!) 97.5 F (36.4 C)  Pulse Rate 72  Resp 13  BP 111/64  SpO2 100 %  O2 Device Room Air  Weight 73.2 kg  Type of Weight Post-Dialysis  Oxygen Therapy  Patient Activity (if Appropriate) In bed  Pulse Oximetry Type Continuous  Post Treatment  Dialyzer Clearance Lightly streaked  Liters Processed 84  Fluid Removed (mL) 3200 mL  Tolerated HD Treatment Yes   Received patient in bed to unit.  Alert and oriented.  Informed consent signed and in chart.   TX duration:3.5  Patient tolerated well.  Transported back to the room  Alert, without acute distress.  Hand-off given to patient's nurse.   Access used: Yes Access issues: No  Total UF removed: 3200 Medication(s) given: See MAR Post HD VS: See Above Grid Post HD weight: 73.2 kg   [facility]ebed[facility] DELENA [facility]

## 2024-04-27 NOTE — Progress Notes (Signed)
 [facility] Progress Note   Subjective:  Seen in HD this morning. BP stable. Able to UF 3.2L today. No reported dyspnea today and patient is on room air. Planned d/c today. Resume regular OP HD as scheduled.   Objective Vitals:   04/27/24 0500 04/27/24 0812 04/27/24 0822 04/27/24 0830  BP:  (!) 126/58 (!) 119/51 (!) 122/59  Pulse:  78 71 76  Resp:  (!) 7 20 18   Temp:  97.8 F (36.6 C)    TempSrc:      SpO2:  100% 100% 100%  Weight: 75.1 kg 74.6 kg    Height:       Physical Exam General: Well appearing woman, NAD. Room air.  Heart: RRR; no murmur Lungs: CTAB Abdomen: soft Extremities: 1+ BLE and BUE edema Dialysis Access: TDC in R chest  Additional Objective Labs: Basic Metabolic Panel: Recent Labs  Lab 04/22/24 0500 04/23/24 0852 04/25/24 0500 04/26/24 0815 04/27/24 0416  NA 126*   < > 129*  127* 131* 129*  K 3.4*   < > 4.6  4.5 3.9 4.4  CL 94*   < > 94*  94* 96* 95*  CO2 21*   < > 23  23 25 25   GLUCOSE 142*   < > 262*  257* 97 189*  BUN 74*   < > 50*  51* 32* 43*  CREATININE 7.33*   < > 5.42*  5.38* 4.13* 4.84*  CALCIUM  7.6*   < > 7.1*  6.9* 7.0* 7.1*  PHOS 4.3  --  3.5  --  2.9   < > = values in this interval not displayed.   Liver Function Tests: Recent Labs  Lab 04/22/24 0500 04/25/24 0500  ALBUMIN  <1.5* <1.5*   CBC: Recent Labs  Lab 04/21/24 0520 04/22/24 0500 04/23/24 0852 04/25/24 0712 04/26/24 0815  WBC 19.1* 17.8* 16.4* 19.2* 19.5*  HGB 7.8* 7.7* 7.5* 7.4* 7.2*  HCT 23.7* 23.7* 23.5* 23.3* 22.8*  MCV 78.7* 79.5* 80.2 80.3 79.7*  PLT 156 194 136* 83* 97*     Medications:  vancomycin       (feeding supplement) PROSource Plus  30 mL Oral BID BM   apixaban   10 mg Oral BID   Followed by   NOREEN ON 05/01/2024] apixaban   5 mg Oral BID   vitamin C   1,000 mg Oral Daily   brimonidine   1 drop Both Eyes BID   calcitRIOL   0.25 mcg Oral Q M,W,F-1800   Chlorhexidine  Gluconate Cloth  6 each Topical Q0600   insulin  aspart   0-5 Units Subcutaneous QHS   insulin  aspart  0-6 Units Subcutaneous TID WC   insulin  aspart  2 Units Subcutaneous TID WC   insulin  glargine-yfgn  5 Units Subcutaneous BID   latanoprost   1 drop Both Eyes QHS   midodrine   10 mg Oral TID WC   nutrition supplement (JUVEN)  1 packet Oral BID BM   sodium chloride  flush  10-40 mL Intracatheter Q12H   zinc  sulfate (50mg  elemental zinc )  220 mg Oral Daily    Dialysis Orders MWF - [facility] 4hr, 350/A1.5, EDW 69.5kg, 3K/2.5Ca bath, TDC, heparin  2500 unit bolus - Leaving under EDW - Mircera 100mcg Ivq 2 weeks (last 7/25) - calcitriol  0.3mcg PO q HD   Assessment/Plan: 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 S/p R BKA. On 8 week course of Vanc ESRD: Usual MWF schedule - delayed HD this week due to line holiday (7/29-7/31), then HD 8/1, 8/2.  Next HD 04/29/24 HypoTN/volume: BP stable on midodrine , does have edema - UF as tolerated today. R internal jugular DVT: On eliquis . Anemia of ESRD: Hgb 7.2 - not due for ESA yet, transfuse if < 7. Secondary HPTH: CorrCa ok, Phos ok - have resumed calcitriol , not on binders. Diet controlled.  Nutrition: Alb very low - continue protein supplements. T2DM: Insulin  per primary.  Dispo: planned d/c today.    Belvie Och, NP 04/27/2024, 8:37 AM  [facility]

## 2024-04-27 NOTE — Progress Notes (Addendum)
 [facility] PROGRESS NOTE    Progress Note  [name]  FMW:[id number] DOB: 09-01-1956 DOA: 04/19/2024 PCP: Campbell Reynolds, NP     Brief Narrative:   [name] is an 68 y.o. 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 +MRSA + Strept Agalactiae blood cultures  7/29 : TDC removed lab holiday 7/30 : Eliquis  held 7/31: TDC replaced   Assessment/Plan:   Septic shock (HCC)/MRSA/group B streptococcus bacteremia in the setting of necrotizing fasciitis of the left lower extremity and right lower extremity cellulitis: Patient did not require pressors.  Status post line holiday 04/19/2024 blood cultures grew MRSA and streptococcal cacti sensitive to IV vancomycin . Orthopedic surgery was consulted and he status post right BKA on 04/20/2024. ID was consulted and the plan is to continue IV vancomycin  for 8 weeks with an end day of treatment of 06/15/2024, antibiotics to be given during dialysis. PT evaluated, awaiting skilled nursing facility placement  End-stage renal disease on hemodialysis: Nephrology on board, TDC removed on 04/21/2024, replaced by IR on 04/23/2024. Eliquis  was resumed on 04/24/2024. Has return to his Monday Wednesday and Friday schedule. Continue midodrine  3 times a day  Uncontrolled diabetes mellitus type 2 with hyperglycemia: Long-acting insulin  per sliding scale blood glucose well-controlled this morning.  History of paroxysmal atrial fibrillation: In sinus rhythm, Coreg  held. Eliquis  full dose.  Right internal jugular with clotted: Cont Eliquis  for  DVT. Doppler of the right lower extremity to rule out central line showed an acute DVT involving the right internal jugular  Normocytic anemia/anemia of chronic disease/thrombocytopenia: Platelets were slightly lower on 04/23/2020 from repeat CBC today.   Received IV iron  on 04/17/2024 Transfuse as needed if hemoglobin less than 7 or symptomatic.  Vascular access: TCD nonfunctioning discontinued and replaced by IR on 04/23/2024.   DVT prophylaxis: Eliquis  Family Communication:none Status is: Inpatient Remains inpatient appropriate because: Awaiting skilled nursing facility placement    Code Status:     Code Status Orders  (From admission, onward)           Start     Ordered   04/19/24 1811  Do not attempt resuscitation (DNR)- Limited -Do Not Intubate (DNI)  (Code Status)  Continuous       Question Answer Comment  If pulseless and not breathing No CPR or chest compressions.   In Pre-Arrest Conditions (Patient Is Breathing and Has A Pulse) Do not intubate. Provide all appropriate non-invasive medical interventions. Avoid ICU transfer unless indicated or required.   Consent: Discussion documented in EHR or advanced directives reviewed      04/19/24 1810           Code Status History     Date Active Date Inactive Code Status Order ID Comments User Context   04/19/2024 1807 04/19/2024 1810 Full Code [id number]  Jude Harden GAILS, MD ED   11/07/2023 1641 11/25/2023 2255 Limited: Do not attempt resuscitation (DNR) -DNR-LIMITED -Do Not Intubate/DNI  [id number]  Rodman Recardo NOVAK, NP Inpatient   11/01/2023 1312 11/07/2023 1641 Full Code [id number]  Raenelle Donalda HERO, MD Inpatient   11/01/2023 1016 11/01/2023 1312 Do not attempt resuscitation (DNR) - Comfort care  [id number]  Raenelle Donalda HERO, MD Inpatient   10/31/2023 2212 11/01/2023 1016 Limited: Do not attempt resuscitation (DNR) -DNR-LIMITED -Do Not Intubate/DNI  [id number]  Rodman Recardo NOVAK, NP Inpatient   10/27/2023 2251 10/31/2023 2212 Do not  attempt resuscitation (DNR) PRE-ARREST INTERVENTIONS DESIRED [id number]  Keturah Carrier, MD ED   10/27/2023 2224 10/27/2023 2251 Full Code [id number]  Keturah Carrier, MD ED   02/13/2013 1824 02/15/2013 1410 Full Code [id number]  Harden Jerona GAILS, MD Inpatient   01/12/2013 1954 01/15/2013 1812 Full Code [id number]  Harden Jerona GAILS, MD Inpatient   01/08/2013 2129 01/12/2013 1954 Full Code [id number]  Billy Dallas GAILS, MD Inpatient      Advance Directive Documentation    Flowsheet Row Most Recent Value  Type of Advance Directive Out of facility DNR (pink MOST or yellow form)  Pre-existing out of facility DNR order (yellow form or pink MOST form) --  MOST Form in Place? --      IV Access:   Peripheral IV   Procedures and diagnostic studies:   No results found.    Medical Consultants:   None.   Subjective:    TAKIRA SHERRIN no complaints today  Objective:    Vitals:   04/26/24 0805 04/26/24 1606 04/26/24 2006 04/27/24 0500  BP: 111/60 (!) 103/56 106/64   Pulse: 70 71 74   Resp: 16 16    Temp: 97.6 F (36.4 C) 97.8 F (36.6 C) 97.8 F (36.6 C)   TempSrc: Oral  Oral   SpO2: 100% 98% 100%   Weight:    75.1 kg  Height:       SpO2: 100 % O2 Flow Rate (L/min): 2 L/min   Intake/Output Summary (Last 24 hours) at 04/27/2024 0720 Last data filed at 04/26/2024 2345 Gross per 24 hour  Intake 10 ml  Output 50 ml  Net -40 ml   Filed Weights   04/25/24 1349 04/25/24 1740 04/27/24 0500  Weight: 74.8 kg 74.3 kg 75.1 kg    Exam: General exam: In no acute distress. Respiratory system: Good air movement and clear to auscultation. Cardiovascular system: S1 & S2 heard, RRR. [name]. Gastrointestinal system: Abdomen is nondistended, soft and nontender.  Extremities: No pedal edema. Skin: No rashes, lesions or ulcers Psychiatry: Judgement and insight appear normal. Mood & affect appropriate. Data Reviewed:    Labs: Basic Metabolic Panel: Recent Labs  Lab 04/21/24 0520  04/22/24 0500 04/23/24 0852 04/24/24 0656 04/25/24 0500 04/26/24 0815 04/27/24 0416  NA 128* 126* 125* 129* 129*  127* 131* 129*  K 3.4* 3.4* 3.2* 3.1* 4.6  4.5 3.9 4.4  CL 95* 94* 95* 95* 94*  94* 96* 95*  CO2 22 21* 20* 25 23  23 25 25   GLUCOSE 124* 142* 62* 133* 262*  257* 97 189*  BUN 58* 74* 85* 35* 50*  51* 32* 43*  CREATININE 6.86* 7.33* 8.26* 4.21* 5.42*  5.38* 4.13* 4.84*  CALCIUM  8.0* 7.6* 7.2* 7.1* 7.1*  6.9* 7.0* 7.1*  MG 2.0  --   --   --  1.8  --   --   PHOS 4.2 4.3  --   --  3.5  --  2.9   GFR Estimated Creatinine Clearance: 11.8 mL/min (A) (by C-G formula based on SCr of 4.84 mg/dL (H)). Liver Function Tests: Recent Labs  Lab 04/22/24 0500 04/25/24 0500  ALBUMIN  <1.5* <1.5*   No results for input(s): LIPASE, AMYLASE in the last 168 hours. No results for input(s): AMMONIA  in the last 168 hours. Coagulation profile No results for input(s): INR, PROTIME in the last 168 hours.  COVID-19 Labs  No results for input(s): DDIMER, FERRITIN, LDH, CRP in the last 72 hours.  Lab Results  Component Value Date   SARSCOV2NAA NEGATIVE 04/19/2024   SARSCOV2NAA NEGATIVE 10/27/2023    CBC: Recent Labs  Lab 04/21/24 0520 04/22/24 0500 04/23/24 0852 04/25/24 0712 04/26/24 0815  WBC 19.1* 17.8* 16.4* 19.2* 19.5*  HGB 7.8* 7.7* 7.5* 7.4* 7.2*  HCT 23.7* 23.7* 23.5* 23.3* 22.8*  MCV 78.7* 79.5* 80.2 80.3 79.7*  PLT 156 194 136* 83* 97*   Cardiac Enzymes: No results for input(s): CKTOTAL, CKMB, CKMBINDEX, TROPONINI in the last 168 hours. BNP (last 3 results) No results for input(s): PROBNP in the last 8760 hours. CBG: Recent Labs  Lab 04/26/24 0015 04/26/24 0807 04/26/24 1106 04/26/24 1608 04/26/24 2241  GLUCAP 128* 98 112* 190* 146*   D-Dimer: No results for input(s): DDIMER in the last 72 hours. Hgb A1c: No results for input(s): HGBA1C in the last 72 hours. Lipid Profile: No results for input(s): CHOL, HDL,  LDLCALC, TRIG, CHOLHDL, LDLDIRECT in the last 72 hours. Thyroid  function studies: No results for input(s): TSH, T4TOTAL, T3FREE, THYROIDAB in the last 72 hours.  Invalid input(s): FREET3 Anemia work up: No results for input(s): VITAMINB12, FOLATE, FERRITIN, TIBC, IRON , RETICCTPCT in the last 72 hours. Sepsis Labs: Recent Labs  Lab 04/22/24 0500 04/23/24 0852 04/25/24 0712 04/26/24 0815  WBC 17.8* 16.4* 19.2* 19.5*   Microbiology Recent Results (from the past 240 hours)  Resp panel by RT-PCR (RSV, Flu A&B, Covid) Anterior Nasal Swab     Status: None   Collection Time: 04/19/24  1:41 PM   Specimen: Anterior Nasal Swab  Result Value Ref Range Status   SARS Coronavirus 2 by RT PCR NEGATIVE NEGATIVE Final   Influenza A by PCR NEGATIVE NEGATIVE Final   Influenza B by PCR NEGATIVE NEGATIVE Final    Comment: (NOTE) The Xpert Xpress SARS-CoV-2/FLU/RSV plus assay is intended as an aid in the diagnosis of influenza from Nasopharyngeal swab specimens and should not be used as a sole basis for treatment. Nasal washings and aspirates are unacceptable for Xpert Xpress SARS-CoV-2/FLU/RSV testing.  Fact Sheet for Patients: BloggerCourse.com  Fact Sheet for Healthcare Providers: SeriousBroker.it  This test is not yet approved or cleared by the [place]  FDA and has been authorized for detection and/or diagnosis of SARS-CoV-2 by FDA under an Emergency Use Authorization (EUA). This EUA will remain in effect (meaning this test can be used) for the duration of the COVID-19 declaration under Section 564(b)(1) of the Act, 21 U.S.C. section 360bbb-3(b)(1), unless the authorization is terminated or revoked.     Resp Syncytial Virus by PCR NEGATIVE NEGATIVE Final    Comment: (NOTE) Fact Sheet for Patients: BloggerCourse.com  Fact Sheet for Healthcare  Providers: SeriousBroker.it  This test is not yet approved or cleared by the [place]  FDA and has been authorized for detection and/or diagnosis of SARS-CoV-2 by FDA under an Emergency Use Authorization (EUA). This EUA will remain in effect (meaning this test can be used) for the duration of the COVID-19 declaration under Section 564(b)(1) of the Act, 21 U.S.C. section 360bbb-3(b)(1), unless the authorization is terminated or revoked.  Performed at [facility], [address] [place], [place] [address]   Blood Culture (routine x 2)     Status: Abnormal   Collection Time: 04/19/24  1:41 PM   Specimen:  BLOOD  Result Value Ref Range Status   Specimen Description BLOOD LEFT ANTECUBITAL  Final   Special Requests   Final    BOTTLES DRAWN AEROBIC AND ANAEROBIC Blood Culture results may not be optimal due to an inadequate volume of blood received in culture bottles   Culture  Setup Time   Final    GRAM POSITIVE COCCI IN BOTH AEROBIC AND ANAEROBIC BOTTLES CRITICAL VALUE NOTED.  VALUE IS CONSISTENT WITH PREVIOUSLY REPORTED AND CALLED VALUE. Performed at [facility], [address] [place], [place] [address]    Culture (A)  Final    STAPHYLOCOCCUS AUREUS SUSCEPTIBILITIES PERFORMED ON PREVIOUS CULTURE WITHIN THE LAST 5 DAYS. GROUP B STREP(S.AGALACTIAE)ISOLATED    Report Status 04/23/2024 FINAL  Final   Organism ID, Bacteria GROUP B STREP(S.AGALACTIAE)ISOLATED  Final      Susceptibility   Group b strep(s.agalactiae)isolated - MIC*    CLINDAMYCIN  >=1 RESISTANT Resistant     AMPICILLIN <=0.25 SENSITIVE Sensitive     ERYTHROMYCIN  >=8 RESISTANT Resistant     VANCOMYCIN  0.5 SENSITIVE Sensitive     CEFTRIAXONE <=0.12 SENSITIVE Sensitive     LEVOFLOXACIN 1 SENSITIVE Sensitive     PENICILLIN <=0.06 SENSITIVE Sensitive     * GROUP B STREP(S.AGALACTIAE)ISOLATED  Blood Culture (routine x 2)     Status: Abnormal   Collection Time: 04/19/24  1:46 PM    Specimen: BLOOD  Result Value Ref Range Status   Specimen Description BLOOD RIGHT ANTECUBITAL  Final   Special Requests   Final    BOTTLES DRAWN AEROBIC AND ANAEROBIC Blood Culture results may not be optimal due to an inadequate volume of blood received in culture bottles   Culture  Setup Time   Final    GRAM POSITIVE COCCI IN BOTH AEROBIC AND ANAEROBIC BOTTLES CRITICAL RESULT CALLED TO, READ BACK BY AND VERIFIED WITH: PHARMD G ABBOTT 04/20/2024 @ 0539 BY AB    Culture (A)  Final    METHICILLIN RESISTANT STAPHYLOCOCCUS AUREUS GBS REPORTED FROM BCID NOT RECOVERED IN CULTURE Performed at [facility], [address], [place], [place] [address]    Report Status 04/25/2024 FINAL  Final   Organism ID, Bacteria METHICILLIN RESISTANT STAPHYLOCOCCUS AUREUS  Final      Susceptibility   Methicillin resistant staphylococcus aureus - MIC*    CIPROFLOXACIN  >=8 RESISTANT Resistant     ERYTHROMYCIN  >=8 RESISTANT Resistant     GENTAMICIN  <=0.5 SENSITIVE Sensitive     OXACILLIN >=4 RESISTANT Resistant     TETRACYCLINE <=1 SENSITIVE Sensitive     VANCOMYCIN  1 SENSITIVE Sensitive     TRIMETH/SULFA <=10 SENSITIVE Sensitive     CLINDAMYCIN  >=8 RESISTANT Resistant     RIFAMPIN <=0.5 SENSITIVE Sensitive     Inducible Clindamycin  NEGATIVE Sensitive     LINEZOLID 2 SENSITIVE Sensitive     * METHICILLIN RESISTANT STAPHYLOCOCCUS AUREUS  Blood Culture ID Panel (Reflexed)     Status: Abnormal   Collection Time: 04/19/24  1:46 PM  Result Value Ref Range Status   Enterococcus faecalis NOT DETECTED NOT DETECTED Final   Enterococcus Faecium NOT DETECTED NOT DETECTED Final   Listeria monocytogenes NOT DETECTED NOT DETECTED Final   Staphylococcus species DETECTED (A) NOT DETECTED Final    Comment: CRITICAL RESULT CALLED TO, READ BACK BY AND VERIFIED WITH: PHARMD G ABBOTT 04/20/2024 @ 0539 BY AB    Staphylococcus aureus (BCID) DETECTED (A) NOT DETECTED Final    Comment: Methicillin (oxacillin)-resistant  Staphylococcus aureus (MRSA). MRSA is  predictably resistant to beta-lactam antibiotics (except ceftaroline). Preferred therapy is vancomycin  unless clinically contraindicated. Patient requires contact precautions if  hospitalized. CRITICAL RESULT CALLED TO, READ BACK BY AND VERIFIED WITH: PHARMD G ABBOTT 04/20/2024 @ 0539 BY AB    Staphylococcus epidermidis NOT DETECTED NOT DETECTED Final   Staphylococcus lugdunensis NOT DETECTED NOT DETECTED Final   Streptococcus species DETECTED (A) NOT DETECTED Final    Comment: CRITICAL RESULT CALLED TO, READ BACK BY AND VERIFIED WITH: PHARMD G ABBOTT 04/20/2024 @ 0539 BY AB    Streptococcus agalactiae DETECTED (A) NOT DETECTED Final    Comment: CRITICAL RESULT CALLED TO, READ BACK BY AND VERIFIED WITH: PHARMD G ABBOTT 04/20/2024 @ 0539 BY AB    Streptococcus pneumoniae NOT DETECTED NOT DETECTED Final   Streptococcus pyogenes NOT DETECTED NOT DETECTED Final   A.calcoaceticus-baumannii NOT DETECTED NOT DETECTED Final   Bacteroides fragilis NOT DETECTED NOT DETECTED Final   Enterobacterales NOT DETECTED NOT DETECTED Final   Enterobacter cloacae complex NOT DETECTED NOT DETECTED Final   Escherichia coli NOT DETECTED NOT DETECTED Final   Klebsiella aerogenes NOT DETECTED NOT DETECTED Final   Klebsiella oxytoca NOT DETECTED NOT DETECTED Final   Klebsiella pneumoniae NOT DETECTED NOT DETECTED Final   Proteus species NOT DETECTED NOT DETECTED Final   Salmonella species NOT DETECTED NOT DETECTED Final   Serratia marcescens NOT DETECTED NOT DETECTED Final   Haemophilus influenzae NOT DETECTED NOT DETECTED Final   Neisseria meningitidis NOT DETECTED NOT DETECTED Final   Pseudomonas aeruginosa NOT DETECTED NOT DETECTED Final   Stenotrophomonas maltophilia NOT DETECTED NOT DETECTED Final   Candida albicans NOT DETECTED NOT DETECTED Final   Candida auris NOT DETECTED NOT DETECTED Final   Candida glabrata NOT DETECTED NOT DETECTED Final   Candida krusei NOT  DETECTED NOT DETECTED Final   Candida parapsilosis NOT DETECTED NOT DETECTED Final   Candida tropicalis NOT DETECTED NOT DETECTED Final   Cryptococcus neoformans/gattii NOT DETECTED NOT DETECTED Final   Meth resistant mecA/C and MREJ DETECTED (A) NOT DETECTED Final    Comment: CRITICAL RESULT CALLED TO, READ BACK BY AND VERIFIED WITH: PHARMD G ABBOTT 04/20/2024 @ 0539 BY AB Performed at [facility], [address] [place], [place] [address]   MRSA Next Gen by PCR, Nasal     Status: None   Collection Time: 04/19/24  7:17 PM   Specimen: Nasal Mucosa; Nasal Swab  Result Value Ref Range Status   MRSA by PCR Next Gen NOT DETECTED NOT DETECTED Final    Comment: (NOTE) The GeneXpert MRSA Assay (FDA approved for NASAL specimens only), is one component of a comprehensive MRSA colonization surveillance program. It is not intended to diagnose MRSA infection nor to guide or monitor treatment for MRSA infections. Test performance is not FDA approved in patients less than 32 years old. Performed at [facility], [address] [place], [place] [address]   Aerobic/Anaerobic Culture w Gram Stain (surgical/deep wound)     Status: None   Collection Time: 04/20/24  6:22 PM   Specimen: Soft Tissue, Other  Result Value Ref Range Status   Specimen Description TISSUE  Final   Special Requests RIGHT LEG  Final   Gram Stain NO WBC SEEN NO ORGANISMS SEEN   Final   Culture   Final    No growth aerobically or anaerobically. Performed at [facility], [address] [place], [place] [address]    Report Status 04/25/2024 FINAL  Final  Culture, blood (Routine X 2) w Reflex to ID Panel     Status: None   Collection Time: 04/21/24  6:16 AM   Specimen: BLOOD  Result Value Ref Range Status   Specimen Description BLOOD BLOOD LEFT HAND  Final   Special Requests   Final    BOTTLES DRAWN AEROBIC AND ANAEROBIC Blood Culture adequate volume   Culture   Final    NO GROWTH 5 DAYS Performed at  [facility], [address], [place], [place] [address]    Report Status 04/26/2024 FINAL  Final  Culture, blood (Routine X 2) w Reflex to ID Panel     Status: None   Collection Time: 04/21/24  6:21 AM   Specimen: BLOOD  Result Value Ref Range Status   Specimen Description BLOOD BLOOD RIGHT ARM  Final   Special Requests   Final    BOTTLES DRAWN AEROBIC AND ANAEROBIC Blood Culture adequate volume   Culture   Final    NO GROWTH 5 DAYS Performed at [facility], [address] [place], [place] [address]    Report Status 04/26/2024 FINAL  Final     Medications:    (feeding supplement) PROSource Plus  30 mL Oral BID BM   apixaban   10 mg Oral BID   Followed by   NOREEN ON 05/01/2024] apixaban   5 mg Oral BID   vitamin C   1,000 mg Oral Daily   brimonidine   1 drop Both Eyes BID   calcitRIOL   0.25 mcg Oral Q M,W,F-1800   Chlorhexidine  Gluconate Cloth  6 each Topical Q0600   insulin  aspart  0-5 Units Subcutaneous QHS   insulin  aspart  0-6 Units Subcutaneous TID WC   insulin  aspart  2 Units Subcutaneous TID WC   insulin  glargine-yfgn  5 Units Subcutaneous BID   latanoprost   1 drop Both Eyes QHS   midodrine   10 mg Oral TID WC   nutrition supplement (JUVEN)  1 packet Oral BID BM   sodium chloride  flush  10-40 mL Intracatheter Q12H   zinc  sulfate (50mg  elemental zinc )  220 mg Oral Daily   Continuous Infusions:  vancomycin         LOS: 8 days   Erle Odell Castor  [facility]  04/27/2024, 7:20 AM

## 2024-04-27 NOTE — Progress Notes (Signed)
 Due to possible SNF placement, AVS has been updated with current dialysis schedule dates and time.   [name] Dialysis Navigator [phone]

## 2024-04-27 NOTE — Progress Notes (Signed)
 PT Cancellation Note  Patient Details Name: [name] MRN: [id number] DOB: [birth year]   Cancelled Treatment:    Reason Eval/Treat Not Completed: Patient at procedure or test/unavailable (HD). Will follow up for PT treatment as schedule permits.  Shannia Jacuinde, PT, DPT Acute Rehabilitation Services  Personal: Secure Chat Rehab Office: [phone]  Darice LITTIE Almas 04/27/2024, 9:08 AM

## 2024-04-27 NOTE — Progress Notes (Signed)
 Occupational Therapy Treatment Patient Details Name: [name] MRN: [id number] DOB: 1956/02/13 Today's Date: 04/27/2024   History of present illness 68 yo female adm 04/19/24 with sepsis, hypotension and Rt food wound. 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 PMHx: ESRD on HD, colostomy, T2DM, Afib, Rt ankle ORIF, CAD   OT comments  Patient demonstrating good gains with OT treatment. Patient able to get to EOB from supine with verbal cues and min assist. Patient donned gown and performed hand and face hygiene seated on EOB. Patient performed 3 stands from EOB with mod assist +2 for sit to stand. On first attempt patient was min to mod assist for standing balance and progressed to CGA +2 on 3rd stand. Patient with complaints of dizziness on 3rd stand. Patient able to perform lateral scooting on EOB towards [facility] with verbal cues.  Patient will benefit from continued inpatient follow up therapy, <3 hours/day. Acute OT to continue to follow to address established goals to facilitate DC to next venue of care.        If plan is discharge home, recommend the following:  Two people to help with walking and/or transfers;A lot of help with bathing/dressing/bathroom;Two people to help with bathing/dressing/bathroom;Assistance with cooking/housework;Assist for transportation;Help with stairs or ramp for entrance   Equipment Recommendations  Other (comment) (defer)    Recommendations for Other Services      Precautions / Restrictions Precautions Precautions: Fall;Other (comment) Recall of Precautions/Restrictions: Impaired Precaution/Restrictions Comments: hypoglycemic events, watch BP (hypotensive), colostomy Restrictions Weight Bearing Restrictions Per Provider Order: Yes RLE Weight Bearing Per Provider Order: Non weight bearing       Mobility Bed Mobility Overal bed mobility: Needs Assistance Bed Mobility: Supine to Sit, Sit to Supine     Supine to sit: Min assist, HOB elevated Sit to supine: Min assist, HOB  elevated   General bed mobility comments: increased time    Transfers Overall transfer level: Needs assistance Equipment used: Rolling walker (2 wheels) Transfers: Sit to/from Stand Sit to Stand: Mod assist, +2 physical assistance           General transfer comment: 3 sit to stands performed from EOB with education on hand placement and safety     Balance Overall balance assessment: Needs assistance Sitting-balance support: Bilateral upper extremity supported, Feet supported Sitting balance-Leahy Scale: Fair Sitting balance - Comments: supervision EOB   Standing balance support: Bilateral upper extremity supported, During functional activity Standing balance-Leahy Scale: Poor Standing balance comment: reliant on external support                           ADL either performed or assessed with clinical judgement   ADL Overall ADL's : Needs assistance/impaired     Grooming: Wash/dry hands;Wash/dry face;Set up;Sitting Grooming Details (indicate cue type and reason): EOB         Upper Body Dressing : Minimal assistance;Sitting Upper Body Dressing Details (indicate cue type and reason): gown for back                   General ADL Comments: focused on sitting balance and sit to stands    Extremity/Trunk Assessment              Vision       Perception     Praxis     Communication Communication Communication: No apparent difficulties   Cognition Arousal: Alert Behavior During Therapy: Flat affect Cognition: Cognition impaired  Executive functioning impairment (select all impairments): Problem solving                   Following commands: Impaired Following commands impaired: Only follows one step commands consistently      Cueing   Cueing Techniques: Verbal cues, Gestural cues, Tactile cues  Exercises      Shoulder Instructions       General Comments VSS on RA    Pertinent Vitals/ Pain       Pain  Assessment Pain Assessment: Faces Faces Pain Scale: Hurts a little bit Pain Location: generalized Pain Descriptors / Indicators: Discomfort, Grimacing, Guarding Pain Intervention(s): Monitored during session, Repositioned  Home Living                                          Prior Functioning/Environment              Frequency  Min 2X/week        Progress Toward Goals  OT Goals(current goals can now be found in the care plan section)  Progress towards OT goals: Progressing toward goals  Acute Rehab OT Goals Patient Stated Goal: go to rehab OT Goal Formulation: With patient Time For Goal Achievement: 05/05/24 Potential to Achieve Goals: Good ADL Goals Pt Will Perform Lower Body Dressing: with min assist;sitting/lateral leans Pt Will Transfer to Toilet: with min assist;with transfer board Pt Will Perform Toileting - Clothing Manipulation and hygiene: with contact guard assist;sitting/lateral leans Additional ADL Goal #1: pt will identify and implement strategies for desensitization to R residual limb Additional ADL Goal #2: pt will engage in dynamic reaching tasks outside BOS in sitting to optimize sitting balance.  Plan      Co-evaluation    PT/OT/SLP Co-Evaluation/Treatment: Yes Reason for Co-Treatment: To address functional/ADL transfers;For patient/therapist safety   OT goals addressed during session: ADL's and self-care      AM-PAC OT 6 Clicks Daily Activity     Outcome Measure   Help from another person eating meals?: A Little Help from another person taking care of personal grooming?: A Little Help from another person toileting, which includes using toliet, bedpan, or urinal?: A Lot Help from another person bathing (including washing, rinsing, drying)?: A Lot Help from another person to put on and taking off regular upper body clothing?: A Little Help from another person to put on and taking off regular lower body clothing?: A Lot 6  Click Score: 15    End of Session Equipment Utilized During Treatment: Gait belt;Rolling walker (2 wheels)  OT Visit Diagnosis: Unsteadiness on feet (R26.81);Muscle weakness (generalized) (M62.81);Pain;Other symptoms and signs involving cognitive function Pain - part of body:  (generalized)   Activity Tolerance Patient tolerated treatment well   Patient Left in bed;with call bell/phone within reach;with bed alarm set   Nurse Communication Mobility status        Time: 8561-8497 OT Time Calculation (min): 24 min  Charges: OT General Charges $OT Visit: 1 Visit OT Treatments $Self Care/Home Management : 8-22 mins  Dick King, OTA Acute Rehabilitation Services  Office [phone]   [name] 04/27/2024, 3:09 PM

## 2024-04-27 NOTE — Progress Notes (Signed)
 OT Cancellation Note  Patient Details Name: [name] MRN: [id number] DOB: 03-24-1956   Cancelled Treatment:    Reason Eval/Treat Not Completed: Patient at procedure or test/ unavailable (Patient off unit in HD. OT to reattempt later today as schedule permits)  Jeb LITTIE Laine 04/27/2024, 8:25 AM  Dick Laine, OTA Acute Rehabilitation Services  Office [phone]

## 2024-04-27 NOTE — Progress Notes (Signed)
 Physical Therapy Treatment Patient Details Name: [name] MRN: [id number] DOB: 10-12-1955 Today's Date: 04/27/2024   History of Present Illness 68 y.o. female admitted 04/19/24 with sepsis, hypotension and R food wound. 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 PMH includes ESRD on HD, colostomy, T2DM, Afib, CAD.   PT Comments  Pt progressing with mobility. Today's session focused on transfer training and standing tolerance, pt requiring modA+1-2 for mobility. Pt motivated to participate despite fatigue post-HD. Pt remains limited by generalized weakness, decreased activity tolerance, and impaired balance strategies/postural reactions. Continue to recommend post-acute rehab services (< 3 hrs/day) to maximize functional mobility and independence prior to return home.     If plan is discharge home, recommend the following: A lot of help with walking and/or transfers;A lot of help with bathing/dressing/bathroom;Help with stairs or ramp for entrance;Assist for transportation;Assistance with cooking/housework   Can travel by private vehicle     No  Equipment Recommendations  Wheelchair (measurements PT);Wheelchair cushion (measurements PT);Rolling walker (2 wheels);BSC/3in1 (drop-arm BSC)    Recommendations for Other Services       Precautions / Restrictions Precautions Precautions: Fall;Other (comment) Recall of Precautions/Restrictions: Impaired Precaution/Restrictions Comments: colostomy; has had hypoglycemic and hypotensive episodes during prior sessions Restrictions Weight Bearing Restrictions Per Provider Order: Yes RLE Weight Bearing Per Provider Order: Non weight bearing Other Position/Activity Restrictions: s/p R BKA     Mobility  Bed Mobility Overal bed mobility: Needs Assistance Bed Mobility: Supine to Sit, Sit to Supine     Supine to sit: HOB elevated, Supervision Sit to supine: Min assist, HOB elevated   General bed mobility comments: increased time    Transfers Overall transfer  level: Needs assistance Equipment used: Rolling walker (2 wheels) Transfers: Sit to/from Stand Sit to Stand: Mod assist, +2 physical assistance          Lateral/Scoot Transfers: Min assist General transfer comment: 3x sit<>stand from EOB to RW, cues for BUE/LLE placement and sequencing, modA+2 for trunk elevation, stability and walker management; poor eccentric control lowering to sit requiring modA. lateral scoot along EOB with minA and intermittent cues for sequencing. pt declines additional standing or OOB transfer secondary to fatigue    Ambulation/Gait                   Stairs             Wheelchair Mobility     Tilt Bed    Modified Rankin (Stroke Patients Only)       Balance Overall balance assessment: Needs assistance Sitting-balance support: No upper extremity supported, Feet supported, Feet unsupported Sitting balance-Leahy Scale: Good     Standing balance support: Bilateral upper extremity supported, During functional activity Standing balance-Leahy Scale: Poor Standing balance comment: reliant on BUE support and external assist. pt tolerating ~15-20 sec bouts of standing before needing to sit secondary to fatigue                            Communication Communication Communication: No apparent difficulties  Cognition Arousal: Alert Behavior During Therapy: Flat affect   PT - Cognitive impairments: No family/caregiver present to determine baseline                       PT - Cognition Comments: slower to initiate movement and answer questions at times, unsure if related to fatigue post-HD Following commands: Impaired Following commands impaired: Only follows one step commands consistently, Follows one step commands with  increased time    Cueing Cueing Techniques: Verbal cues, Gestural cues, Tactile cues  Exercises      General Comments General comments (skin integrity, edema, etc.): R residual limb shrinker sock  readjusted. pt very fearful of falling requiring frequent encouragement and cues for standing balance. educ re: precautions, positioning (importance of resting with R knee extension), LE therex/AROM, activity recommendations, d/c needs      Pertinent Vitals/Pain Pain Assessment Pain Assessment: Faces Faces Pain Scale: Hurts a little bit Pain Location: generalized Pain Descriptors / Indicators: Tiring Pain Intervention(s): Monitored during session, Limited activity within patient's tolerance    Home Living                          Prior Function            PT Goals (current goals can now be found in the care plan section) Progress towards PT goals: Progressing toward goals    Frequency    Min 2X/week      PT Plan      Co-evaluation PT/OT/SLP Co-Evaluation/Treatment: Yes Reason for Co-Treatment: To address functional/ADL transfers;For patient/therapist safety PT goals addressed during session: Mobility/safety with mobility;Balance OT goals addressed during session: ADL's and self-care      AM-PAC PT 6 Clicks Mobility   Outcome Measure  Help needed turning from your back to your side while in a flat bed without using bedrails?: A Little Help needed moving from lying on your back to sitting on the side of a flat bed without using bedrails?: A Little Help needed moving to and from a bed to a chair (including a wheelchair)?: A Lot Help needed standing up from a chair using your arms (e.g., wheelchair or bedside chair)?: A Lot Help needed to walk in hospital room?: Total Help needed climbing 3-5 steps with a railing? : Total 6 Click Score: 12    End of Session Equipment Utilized During Treatment: Gait belt Activity Tolerance: Patient tolerated treatment well;Patient limited by fatigue Patient left: in bed;with call bell/phone within reach;with bed alarm set Nurse Communication: Mobility status PT Visit Diagnosis: Difficulty in walking, not elsewhere  classified (R26.2);Unsteadiness on feet (R26.81);Muscle weakness (generalized) (M62.81)     Time: 8559-8496 PT Time Calculation (min) (ACUTE ONLY): 23 min  Charges:    $Therapeutic Activity: 8-22 mins PT General Charges $$ ACUTE PT VISIT: 1 Visit                     Darice Almas, PT, DPT Acute Rehabilitation Services  Personal: Secure Chat Rehab Office: [phone]  Darice LITTIE Almas 04/27/2024, 3:22 PM

## 2024-04-27 NOTE — Discharge Summary (Incomplete)
 Physician Discharge Summary  VERMELLE CAMMARATA FMW:[id number] DOB: [birth year] DOA: 04/19/2024  PCP: Campbell Reynolds, NP  Admit date: 04/19/2024 Discharge date: 04/28/2024  Admitted From: Home Disposition:  SNF  Recommendations for Outpatient Follow-up:  Follow up with PCP in 1-2 weeks Please obtain BMP/CBC in one week   Home Health:No Equipment/Devices:None  Discharge Condition:Stable CODE STATUS:dnr diet recommendation: Heart Healthy   Brief/Interim Summary: 68 y.o. 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 +MRSA + Strept Agalactiae blood cultures  7/29 : TDC removed lab holiday 7/30 : Eliquis  held 7/31: TDC replaced 8/1: HD session today   Discharge Diagnoses:  Principal Problem:   Septic shock (HCC) Active Problems:   Osteomyelitis (HCC)   MRSA bacteremia   Bacteremia   Necrotizing fasciitis (HCC)   ESRD (end stage renal disease) (HCC)  Septic shock due to MRSA/strep B bacteremia in the setting of necrotizing fasciitis and right lower extremity cellulitis: Patient did not require pressors. She was started empirically on IV antibiotics. 04/19/2024 blood cultures grew MRSA and Streptococcus group B. Sensitive to Vanco. Orthopedic surgery was consulted and right BKA was performed on 04/20/2024. ID was consulted and plan to continue IV vancomycin  for 8 weeks end of treatment date is 06/15/2024. Antibiotics to be given during dialysis. PT OT evaluated the patient, she will go to skilled nursing facility.  End Stage renal disease on hemodialysis: Nephrology was consulted tunneled  dialysis catheter was discontinued 04/21/2024. Replaced again by IR on 04/23/2024. Eliquis  was resumed on 04/24/2024 which was held. She will continue her regular dialysis days on Monday Wednesday and Friday. Continue midodrine  with dialysis.  Uncontrolled diabetes mellitus type 2 with hyperglycemia: No change made to her medication.  History of paroxysmal atrial fibrillation: Eliquis  and Coreg  were held they will be resumed as an outpatient.  Right jugular DVT: Doppler of the right upper extremity showed acute DVT catheter was discontinued. She continue Eliquis  as an outpatient.  Normocytic anemia/anemia of chronic disease/thrombocytopenia: Likely reactive she received IV iron  04/17/2024.    Discharge Instructions  Discharge Instructions     Diet - low sodium heart healthy   Complete by: As directed    Home infusion instructions   Complete by: As directed    Instructions: Flushing of vascular access device: 0.9% NaCl pre/post medication administration and prn patency; Heparin  100 u/ml, 5ml for implanted ports and Heparin  10u/ml, 5ml for all other central venous catheters.   Increase activity slowly   Complete by: As directed    No wound care   Complete by: As directed       Allergies as of 04/28/2024       Reactions   Nsaids Other (See Comments)   CKD stage 4   Lisinopril Other (See Comments)   coughing   Peanut-containing Drug Products Itching, Other (See Comments)   GI intolerance - diarrhea        Medication List     STOP taking these medications    amLODipine  5 MG tablet Commonly known as: NORVASC    furosemide  40 MG tablet Commonly known as: LASIX    Repatha SureClick 140 MG/ML Soaj Generic drug: Evolocumab   VITAMIN  D (CHOLECALCIFEROL) PO       TAKE these medications    Accu-Chek Aviva Plus test strip Generic drug: glucose blood CHECK BLOOD GLUCOSE THREE TIMES DAILY   Accu-Chek Aviva Plus w/Device Kit CHECK BLOOD GLUCOSE THREE TIMES DAILY    accu-chek softclix lancets Fill for 1 month for TID testing. Use as instructed   albuterol  (2.5 MG/3ML) 0.083% nebulizer solution Commonly known as: PROVENTIL  Take 3 mLs (2.5 mg total) by nebulization every 2 (two) hours as needed for wheezing.   apixaban  2.5 MG Tabs tablet Commonly known as: Eliquis  Take 1 tablet (2.5 mg total) by mouth 2 (two) times daily.   brimonidine  0.2 % ophthalmic solution Commonly known as: ALPHAGAN  Place 1 drop into both eyes 2 (two) times daily.   calcitRIOL  0.25 MCG capsule Commonly known as: ROCALTROL  TAKE 1 CAPSULE (0.25 MCG TOTAL) EVERY MONDAY, WEDNESDAY, AND FRIDAY What changed: See the new instructions.   carvedilol  12.5 MG tablet Commonly known as: COREG  TAKE 1 TABLET (12.5 MG TOTAL) BY MOUTH 2 (TWO) TIMES DAILY WITH A MEAL.   Droplet Insulin  Syringe 31G X 5/16 0.5 ML Misc Generic drug: Insulin  Syringe-Needle U-100 USE FOUR TIMES DAILY FOR INJECTIONS   insulin  glargine 100 UNIT/ML injection Commonly known as: Lantus  Inject 0.07 mLs (7 Units total) into the skin daily. Please schedule appointment before next refill. What changed: how much to take   Insulin  Syringes (Disposable) U-100 0.5 ML Misc 4x daily injections   latanoprost  0.005 % ophthalmic solution Commonly known as: XALATAN  Place 1 drop into both eyes at bedtime.   melatonin 5 MG Tabs Take 10 mg by mouth at bedtime as needed (for insomnia).   midodrine  2.5 MG tablet Commonly known as: PROAMATINE  Take 2.5 mg by mouth.   multivitamin with minerals Tabs tablet Take 1 tablet by mouth daily.   NEEDLE (DISP) 30 G 30G X 1/2 Misc Commonly known as: BD Disp Needles For 4x daily injections   NovoLIN R 100 UNIT/ML injection Generic drug: insulin  regular Inject 0-9 Units into the skin in the morning, at noon, and at bedtime. Per sliding scale   NovoLOG  FlexPen 100 UNIT/ML FlexPen Generic drug: insulin  aspart 0-9 Units, Subcutaneous, 3 times daily with meals CBG < 70:  Implement Hypoglycemia measures CBG 70 - 120: 0 units CBG 121 - 150: 1 unit CBG 151 - 200: 2 units CBG 201 - 250: 3 units CBG 251 - 300: 5 units CBG 301 - 350: 7 units CBG 351 - 400: 9 units CBG > 400: call MD   oxyCODONE  5 MG immediate release tablet Commonly known as: Oxy IR/ROXICODONE  Take 1 tablet (5 mg total) by mouth every 6 (six) hours as needed for moderate pain (pain score 4-6).   Santyl  250 UNIT/GM ointment Generic drug: collagenase  Apply 1 Application topically daily. Apply nickel thick amount to ulcer Right lateral ankle 3 x 2 cm with muscle/fascia capsule exposed   vancomycin  IVPB Inject 750 mg into the vein every Monday, Wednesday, and Friday with hemodialysis. Indication:  MRSA Bacteremia Last Day of Therapy:  06/16/24 Labs - Once weekly: Vancomycin  trough               Home Infusion Instuctions  (From admission, onward)           Start     Ordered   04/23/24 0000  Home infusion instructions       Question:  Instructions  Answer:  Flushing of vascular access device: 0.9% NaCl pre/post medication administration and  prn patency; Heparin  100 u/ml, 5ml for implanted ports and Heparin  10u/ml, 5ml for all other central venous catheters.   04/23/24 1530            Contact information for follow-up providers     Harden Jerona GAILS, MD Follow up in 1 week(s).   Specialty: Orthopedic Surgery Contact information: [address]  [place] [address] [phone]         Center, [facility]. Go on 04/29/2024.   Why: dialysis appointment schedule is every Monday, Wednesday, and friday, at 10:50am. would need to arrive by 10:30am. Contact information: [address] [place] [address] [phone]              Contact information for after-discharge care     Destination     [facility] .   Service: Skilled Nursing Contact information: [address] [place]  [address] [phone]                     Allergies  Allergen Reactions   Nsaids Other (See Comments)    CKD stage 4   Lisinopril Other (See Comments)    coughing   Peanut-Containing Drug Products Itching and Other (See Comments)    GI intolerance - diarrhea    Consultations: Pulmonary and critical care Infectious disease Orthopedic surgery   Procedures/Studies: VAS US  UPPER EXTREMITY VENOUS DUPLEX Result Date: 04/24/2024 UPPER VENOUS STUDY  Patient Name:  HAUNANI DICKARD [facility]  Date of Exam:   04/24/2024 Medical Rec #: [id number]          Accession #:    [id number] Date of Birth: [birth year]          Patient Gender: F Patient Age:   68 years Exam Location:  [facility] Procedure:      VAS US  UPPER EXTREMITY VENOUS DUPLEX Referring Phys: MATTHEW HUNSUCKER --------------------------------------------------------------------------------  Indications: Erythema and edema medial right forearm Limitations: Bandages and line. Performing Technologist: Ricka Sturdivant-Jones RDMS, RVT  Examination Guidelines: A complete evaluation includes B-mode imaging, spectral Doppler, color Doppler, and power Doppler as needed of all accessible portions of each vessel. Bilateral testing is considered an integral part of a complete examination. Limited examinations for reoccurring indications may be performed as noted.  Right Findings: +----------+------------+---------+-----------+----------+-------+ RIGHT     CompressiblePhasicitySpontaneousPropertiesSummary +----------+------------+---------+-----------+----------+-------+ IJV           None       No        No                Acute  +----------+------------+---------+-----------+----------+-------+ Subclavian               Yes       Yes                      +----------+------------+---------+-----------+----------+-------+ Axillary      Full       Yes       Yes                      +----------+------------+---------+-----------+----------+-------+ Brachial      Full                                           +----------+------------+---------+-----------+----------+-------+ Radial        Full                                          +----------+------------+---------+-----------+----------+-------+  Ulnar         Full                                          +----------+------------+---------+-----------+----------+-------+ Cephalic    Partial                                  Acute  +----------+------------+---------+-----------+----------+-------+ Basilic       Full                                          +----------+------------+---------+-----------+----------+-------+ Poor visualization of the right subclavian vein due to tape/bandages. Distal segment appears patent.  Left Findings: +----------+------------+---------+-----------+----------+-------+ LEFT      CompressiblePhasicitySpontaneousPropertiesSummary +----------+------------+---------+-----------+----------+-------+ IJV           Full       Yes       Yes                      +----------+------------+---------+-----------+----------+-------+ Subclavian    Full       Yes       Yes                      +----------+------------+---------+-----------+----------+-------+ Axillary      Full       Yes       Yes                      +----------+------------+---------+-----------+----------+-------+ Brachial      Full                                          +----------+------------+---------+-----------+----------+-------+ Radial        Full                                          +----------+------------+---------+-----------+----------+-------+ Ulnar         Full                                          +----------+------------+---------+-----------+----------+-------+ Cephalic      Full                                          +----------+------------+---------+-----------+----------+-------+ Basilic       Full                                           +----------+------------+---------+-----------+----------+-------+  Summary:  Right: Findings consistent with acute deep vein thrombosis involving the right internal jugular vein. Findings consistent with acute superficial vein thrombosis involving the right cephalic vein at the antecubital location.  Left: No evidence of deep vein thrombosis in the upper extremity. No evidence  of superficial vein thrombosis in the upper extremity.  *See table(s) above for measurements and observations.  Diagnosing physician: Debby Robertson Electronically signed by Debby Robertson on 04/24/2024 at 4:51:39 PM.    Final    IR Fluoro Guide CV Line Left Result Date: 04/23/2024 INDICATION: 68 year old female with history of end-stage renal disease requiring re-establishment of adequate hemodialysis access. EXAM: TUNNELED CENTRAL VENOUS HEMODIALYSIS CATHETER PLACEMENT WITH ULTRASOUND AND FLUOROSCOPIC GUIDANCE MEDICATIONS: Ancef  2 gm IV . The antibiotic was given in an appropriate time interval prior to skin puncture. ANESTHESIA/SEDATION: Moderate (conscious) sedation was employed during this procedure. A total of Versed  1 mg and Fentanyl  50 mcg was administered intravenously. Moderate Sedation Time: 40 minutes. The patient's level of consciousness and vital signs were monitored continuously by radiology nursing throughout the procedure under my direct supervision. FLUOROSCOPY TIME:  Sixty mGy reference air kerma COMPLICATIONS: None immediate. PROCEDURE: Informed written consent was obtained from the patient after a discussion of the risks, benefits, and alternatives to treatment. Questions regarding the procedure were encouraged and answered. Preprocedure ultrasound evaluation demonstrated occlusion of the right internal jugular vein. The left neck and chest were prepped with chlorhexidine  in a sterile fashion, and a sterile drape was applied covering the operative field. Maximum barrier sterile technique with sterile gowns and  gloves were used for the procedure. A timeout was performed prior to the initiation of the procedure. After creating a small venotomy incision, a 21 gauge micropuncture kit was utilized to access the internal jugular vein. Real-time ultrasound guidance was utilized for vascular access including the acquisition of a permanent ultrasound image documenting patency of the accessed vessel. A Rosen wire was advanced to the level of the IVC and the micropuncture sheath was exchanged for an 8 Fr dilator. A 14.5 French tunneled hemodialysis catheter measuring 23 cm from tip to cuff was tunneled in a retrograde fashion from the anterior chest wall to the venotomy incision. Serial dilation was then performed an a peel-away sheath was placed. The catheter was then placed through the peel-away sheath with the catheter tip ultimately positioned within the right atrium. Final catheter positioning was confirmed and documented with a spot radiographic image. The catheter aspirates and flushes normally. The catheter was flushed with appropriate volume heparin  dwells. The catheter exit site was secured with a 0-Silk retention suture. The venotomy incision was closed with Dermabond. Sterile dressings were applied. The patient tolerated the procedure well without immediate post procedural complication. IMPRESSION: Successful placement of 23 cm tip to cuff tunneled hemodialysis catheter via the left internal jugular vein with catheter tip terminating within the right atrium. The catheter is ready for immediate use. Ester Sides, MD [facility] Electronically Signed   By: Ester Sides M.D.   On: 04/23/2024 22:49   IR US  Guide Vasc Access Left Result Date: 04/23/2024 INDICATION: 68 year old female with history of end-stage renal disease requiring re-establishment of adequate hemodialysis access. EXAM: TUNNELED CENTRAL VENOUS HEMODIALYSIS CATHETER PLACEMENT WITH ULTRASOUND AND  FLUOROSCOPIC GUIDANCE MEDICATIONS: Ancef  2 gm IV . The antibiotic was given in an appropriate time interval prior to skin puncture. ANESTHESIA/SEDATION: Moderate (conscious) sedation was employed during this procedure. A total of Versed  1 mg and Fentanyl  50 mcg was administered intravenously. Moderate Sedation Time: 40 minutes. The patient's level of consciousness and vital signs were monitored continuously by radiology nursing throughout the procedure under my direct supervision. FLUOROSCOPY TIME:  Sixty mGy reference air kerma COMPLICATIONS: None immediate. PROCEDURE: Informed written consent was  obtained from the patient after a discussion of the risks, benefits, and alternatives to treatment. Questions regarding the procedure were encouraged and answered. Preprocedure ultrasound evaluation demonstrated occlusion of the right internal jugular vein. The left neck and chest were prepped with chlorhexidine  in a sterile fashion, and a sterile drape was applied covering the operative field. Maximum barrier sterile technique with sterile gowns and gloves were used for the procedure. A timeout was performed prior to the initiation of the procedure. After creating a small venotomy incision, a 21 gauge micropuncture kit was utilized to access the internal jugular vein. Real-time ultrasound guidance was utilized for vascular access including the acquisition of a permanent ultrasound image documenting patency of the accessed vessel. A Rosen wire was advanced to the level of the IVC and the micropuncture sheath was exchanged for an 8 Fr dilator. A 14.5 French tunneled hemodialysis catheter measuring 23 cm from tip to cuff was tunneled in a retrograde fashion from the anterior chest wall to the venotomy incision. Serial dilation was then performed an a peel-away sheath was placed. The catheter was then placed through the peel-away sheath with the catheter tip ultimately positioned within the right atrium. Final catheter  positioning was confirmed and documented with a spot radiographic image. The catheter aspirates and flushes normally. The catheter was flushed with appropriate volume heparin  dwells. The catheter exit site was secured with a 0-Silk retention suture. The venotomy incision was closed with Dermabond. Sterile dressings were applied. The patient tolerated the procedure well without immediate post procedural complication. IMPRESSION: Successful placement of 23 cm tip to cuff tunneled hemodialysis catheter via the left internal jugular vein with catheter tip terminating within the right atrium. The catheter is ready for immediate use. Ester Sides, MD [facility] Electronically Signed   By: Ester Sides M.D.   On: 04/23/2024 22:49   IR Removal Tun Cv Cath W/O FL Result Date: 04/21/2024 INDICATION: 68 year old female with ESRD on HD, with request for line holiday. EXAM: REMOVAL TUNNELED CENTRAL VENOUS CATHETER MEDICATIONS: 2 mL of 1% lidocaine  ANESTHESIA/SEDATION: None FLUOROSCOPY: None COMPLICATIONS: None immediate. PROCEDURE: Informed written consent was obtained from the patient after a thorough discussion of the procedural risks, benefits and alternatives. All questions were addressed. Maximal Sterile Barrier Technique was utilized including mask, sterile gloves, sterile drape, hand hygiene and skin antiseptic. A timeout was performed prior to the initiation of the procedure. The patient's right chest and catheter was prepped and draped in a normal sterile fashion. Heparin  was removed from both ports of catheter. 1% lidocaine  was used for local anesthesia. Using gentle blunt dissection the cuff of the catheter was exposed and the catheter was removed in it's entirety. Pressure was held till hemostasis was obtained. A sterile dressing was applied. The patient tolerated the procedure well with no immediate complications. IMPRESSION: Successful catheter removal  as described above. Procedure performed by Carlin Griffon, PA-C Electronically Signed   By: CHRISTELLA.  Shick M.D.   On: 04/21/2024 15:57   DG Shoulder Right Port Result Date: 04/21/2024 CLINICAL DATA:  Right shoulder pain after fall several weeks ago. EXAM: RIGHT SHOULDER - 1 VIEW COMPARISON:  None Available. FINDINGS: There is no evidence of fracture or dislocation. There is no evidence of arthropathy or other focal bone abnormality. Soft tissues are unremarkable. IMPRESSION: Negative. Electronically Signed   By: Lynwood Landy Raddle M.D.   On: 04/21/2024 10:31   MR LUMBAR SPINE WO CONTRAST Result Date: 04/21/2024 CLINICAL DATA:  bacteremia  mrsa. back tenderness EXAM: MRI LUMBAR SPINE WITHOUT CONTRAST TECHNIQUE: Multiplanar, multisequence MR imaging of the lumbar spine was performed. No intravenous contrast was administered. COMPARISON:  None Available. FINDINGS: Segmentation:  Standard. Alignment:  Physiologic. Vertebrae: Vertebral body heights are maintained. No visible marrow edema to suggest acute fracture or osteomyelitis. No disc edema to suggest discitis. No suspicious bone lesions. The absence of contrast precludes evaluation for abnormal enhancement. No visible epidural abscess. Conus medullaris and cauda equina: Conus extends to the L1-L2 level. Conus and cauda equina appear normal. Paraspinal and other soft tissues: Small kidneys with small cysts bilaterally. Disc levels: Mild facet arthropathy and slight disc bulging in the lower lumbar spine without significant canal or foraminal stenosis. IMPRESSION: 1. No specific evidence of discitis/osteomyelitis. No visible epidural abscess on this noncontrast study. 2. No significant stenosis. Electronically Signed   By: Gilmore GORMAN Molt M.D.   On: 04/21/2024 01:46   ECHOCARDIOGRAM COMPLETE BUBBLE STUDY Result Date: 04/20/2024    ECHOCARDIOGRAM REPORT   Patient Name:   VANNAH NADAL [facility] Date of Exam: 04/20/2024 Medical Rec #:  [id number]         Height:       67.0 in  Accession #:    [id number]        Weight:       160.9 lb Date of Birth:  12/18/1955         BSA:          1.844 m Patient Age:    68 years          BP:           115/53 mmHg Patient Gender: F                 HR:           72 bpm. Exam Location:  Inpatient Procedure: 2D Echo, Cardiac Doppler, Color Doppler, Saline Contrast Bubble Study            and Intracardiac Opacification Agent (Both Spectral and Color Flow            Doppler were utilized during procedure). Indications:    Sepsis  History:        Patient has prior history of Echocardiogram examinations.  Sonographer:    Vella Key Referring Phys: [phone] MATTHEW R HUNSUCKER  Sonographer Comments: Technically difficult study due to poor echo windows. IMPRESSIONS  1. Left ventricular ejection fraction, by estimation, is 50 to 55%. The left ventricle has low normal function. Left ventricular endocardial border not optimally defined to evaluate regional wall motion. There is mild left ventricular hypertrophy. Left ventricular diastolic parameters are consistent with Grade I diastolic dysfunction (impaired relaxation).  2. Right ventricular systolic function is mildly reduced. The right ventricular size is mildly enlarged. There is moderately elevated pulmonary artery systolic pressure. The estimated right ventricular systolic pressure is 51.0 mmHg.  3. The mitral valve is degenerative. Trivial mitral valve regurgitation. No evidence of mitral stenosis.  4. The aortic valve is tricuspid. There is mild calcification of the aortic valve. Aortic valve regurgitation is trivial. No aortic stenosis is present.  5. The inferior vena cava is dilated in size with <50% respiratory variability, suggesting right atrial pressure of 15 mmHg.  6. Nondiagnostic agitated saline injection due to image quality. FINDINGS  Left Ventricle: Left ventricular ejection fraction, by estimation, is 50 to 55%. The left ventricle has low normal function. Left ventricular endocardial border not  optimally defined to evaluate regional wall motion.  The left ventricular internal cavity  size was normal in size. There is mild left ventricular hypertrophy. Left ventricular diastolic parameters are consistent with Grade I diastolic dysfunction (impaired relaxation). Right Ventricle: The right ventricular size is mildly enlarged. Right vetricular wall thickness was not well visualized. Right ventricular systolic function is mildly reduced. There is moderately elevated pulmonary artery systolic pressure. The tricuspid  regurgitant velocity is 3.00 m/s, and with an assumed right atrial pressure of 15 mmHg, the estimated right ventricular systolic pressure is 51.0 mmHg. Left Atrium: Left atrial size was normal in size. Right Atrium: Right atrial size was normal in size. Pericardium: There is no evidence of pericardial effusion. Mitral Valve: The mitral valve is degenerative in appearance. Mild mitral annular calcification. Trivial mitral valve regurgitation. No evidence of mitral valve stenosis. Tricuspid Valve: The tricuspid valve is normal in structure. Tricuspid valve regurgitation is mild . No evidence of tricuspid stenosis. Aortic Valve: The aortic valve is tricuspid. There is mild calcification of the aortic valve. Aortic valve regurgitation is trivial. No aortic stenosis is present. Pulmonic Valve: The pulmonic valve was not well visualized. Pulmonic valve regurgitation is trivial. No evidence of pulmonic stenosis. Aorta: The aortic root is normal in size and structure. Venous: The inferior vena cava is dilated in size with less than 50% respiratory variability, suggesting right atrial pressure of 15 mmHg. IAS/Shunts: The interatrial septum was not well visualized. Agitated saline contrast was given intravenously to evaluate for intracardiac shunting. Nondiagnostic agitated saline injection due to image quality.  LEFT VENTRICLE PLAX 2D LVIDd:         3.20 cm     Diastology LVIDs:         2.90 cm     LV e'  medial:    3.81 cm/s LV PW:         1.00 cm     LV E/e' medial:  19.0 LV IVS:        1.00 cm     LV e' lateral:   6.20 cm/s LVOT diam:     1.50 cm     LV E/e' lateral: 11.7 LV SV:         37 LV SV Index:   20 LVOT Area:     1.77 cm  LV Volumes (MOD) LV vol d, MOD A2C: 83.0 ml LV vol d, MOD A4C: 77.7 ml LV vol s, MOD A2C: 37.2 ml LV vol s, MOD A4C: 33.4 ml LV SV MOD A2C:     45.8 ml LV SV MOD A4C:     77.7 ml LV SV MOD BP:      46.3 ml RIGHT VENTRICLE RV S prime:     6.53 cm/s TAPSE (M-mode): 1.6 cm LEFT ATRIUM             Index        RIGHT ATRIUM           Index LA diam:        4.20 cm 2.28 cm/m   RA Area:     13.70 cm LA Vol (A2C):   46.6 ml 25.27 ml/m  RA Volume:   35.00 ml  18.98 ml/m LA Vol (A4C):   37.5 ml 20.34 ml/m LA Biplane Vol: 42.1 ml 22.83 ml/m  AORTIC VALVE LVOT Vmax:   108.00 cm/s LVOT Vmean:  74.100 cm/s LVOT VTI:    0.208 m  AORTA Ao Root diam: 3.00 cm MITRAL VALVE  TRICUSPID VALVE MV Area (PHT): 3.81 cm    TV Peak grad:   36.0 mmHg MV Decel Time: 199 msec    TV Vmax:        3.00 m/s MV E velocity: 72.40 cm/s  TR Peak grad:   36.0 mmHg MV A velocity: 93.00 cm/s  TR Vmax:        300.00 cm/s MV E/A ratio:  0.78                            SHUNTS                            Systemic VTI:  0.21 m                            Systemic Diam: 1.50 cm Soyla Merck MD Electronically signed by Soyla Merck MD Signature Date/Time: 04/20/2024/10:54:01 PM    Final    VAS US  ABI WITH/WO TBI Result Date: 04/20/2024  LOWER EXTREMITY DOPPLER STUDY Patient Name:  PEGGIE HORNAK  Date of Exam:   04/20/2024 Medical Rec #: [id number]          Accession #:    [id number] Date of Birth: Mar 17, 1956          Patient Gender: F Patient Age:   32 years Exam Location:  [facility] Procedure:      VAS US  ABI WITH/WO TBI Referring Phys: MARCUS DUDA --------------------------------------------------------------------------------  Indications: Peripheral artery disease.  Performing Technologist:  Jimmye Scarce RVT  Examination Guidelines: A complete evaluation includes at minimum, Doppler waveform signals and systolic blood pressure reading at the level of bilateral brachial, anterior tibial, and posterior tibial arteries, when vessel segments are accessible. Bilateral testing is considered an integral part of a complete examination. Photoelectric Plethysmograph (PPG) waveforms and toe systolic pressure readings are included as required and additional duplex testing as needed. Limited examinations for reoccurring indications may be performed as noted.  ABI Findings: +---------+------------------+-----+----------+--------------------------------+ Right    Rt Pressure (mmHg)IndexWaveform  Comment                          +---------+------------------+-----+----------+--------------------------------+ Brachial 107                              unable to enter manually on the                                            report due to machine                                                      malfunction                      +---------+------------------+-----+----------+--------------------------------+ PTA                             monophasic                                 +---------+------------------+-----+----------+--------------------------------+  DP       186               1.74 monophasic                                 +---------+------------------+-----+----------+--------------------------------+ Great Toe                       Abnormal  unable to obtain pressure due to                                           essentially flat PPG digit                                                 waveform                         +---------+------------------+-----+----------+--------------------------------+ +---------+------------------+-----+---------+--------------------------+ Left     Lt Pressure (mmHg)IndexWaveform Comment                     +---------+------------------+-----+---------+--------------------------+ Brachial 104                    triphasic                           +---------+------------------+-----+---------+--------------------------+ PTA                             biphasic                            +---------+------------------+-----+---------+--------------------------+ DP       198               1.85 biphasic                            +---------+------------------+-----+---------+--------------------------+ Great Toe109               1.02 Abnormal possibly falsely elevated. +---------+------------------+-----+---------+--------------------------+ +-------+-----------+-----------+------------+------------+ ABI/TBIToday's ABIToday's TBIPrevious ABIPrevious TBI +-------+-----------+-----------+------------+------------+ Right  N/C                                            +-------+-----------+-----------+------------+------------+ Left   N/C/1.05                                       +-------+-----------+-----------+------------+------------+   Summary: Right: Resting right ankle-brachial index indicates noncompressible right lower extremity arteries. Left: Resting left ankle-brachial index indicates noncompressible left lower extremity arteries. Falsely elevated ABI and TBI  *See table(s) above for measurements and observations.  Electronically signed by Fonda Rim on 04/20/2024 at 6:09:30 PM.    Final    CT ANKLE RIGHT W CONTRAST Result Date: 04/19/2024 CLINICAL DATA:  Necrotizing fasciitis EXAM: CT OF THE RIGHT ANKLE WITH CONTRAST TECHNIQUE: Multidetector CT imaging of  the right ankle was performed following the standard protocol during bolus administration of intravenous contrast. RADIATION DOSE REDUCTION: This exam was performed according to the departmental dose-optimization program which includes automated exposure control, adjustment of the mA and/or kV according to patient  size and/or use of iterative reconstruction technique. CONTRAST:  75mL OMNIPAQUE  IOHEXOL  350 MG/ML SOLN COMPARISON:  04/19/2024 radiographs FINDINGS: Bones/Joint/Cartilage Lateral plate and screw fixator in the distal fibula with a long transverse screw extending into the tibia. Collapse and erosion of the talus and tibial plafond and with gas along the joint space and resulting cavities in this region. Substantial erosions or cystic lesions along the distal fibular tip, the distal tibial margin, and the remaining flattened talus. Gas in the talonavicular articulation, sinus tarsi, and subtalar joints. Gas in the calcaneocuboid joint, midfoot joints, and Lisfranc joint. Hemangioma or lipoma in the posterior calcaneus. Ligaments Suboptimally assessed by CT. Muscles and Tendons Gas densities track within along the distal tibialis posterior muscle and tendon at the level of the distal diaphysis and distally, along with a small amount of gas density posterior to the tibiofibular syndesmosis. Gas tracks in the common peroneus tendon sheath and along the peroneus longus. Thickened medial band of the plantar fascia. Muscular atrophy in the distal calf. Soft tissues Severe ulceration overlying the distal fibula with possible exposure of the periosteum and fibular plate/screws for example on image 117 series 5. Subcutaneous edema tracks along the plantar hindfoot and dorsal forefoot. IMPRESSION: 1. Severe ulceration overlying the distal fibula with possible exposure of the periosteum and fibular plate/screws. 2. Collapse and erosion of the talus and tibial plafond with gas along the joint space and resulting cavities in this region compatible with osteomyelitis. Substantial erosions or cystic lesions along the distal fibular tip, the distal tibial margin, and the remaining flattened talus. Gas in the resulting tibiotalar cavity, talonavicular articulation, sinus tarsi, subtalar joints, calcaneocuboid joint, midfoot joints,  and Lisfranc joint. Gas also tracks along various tendon groups and adjacent to the tibiofibular syndesmosis. 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 3. Subcutaneous edema tracks along the plantar hindfoot and dorsal forefoot. 4. Thickened medial band of the plantar fascia. 5. Muscular atrophy in the distal calf. Electronically Signed   By: Ryan Salvage M.D.   On: 04/19/2024 19:14   DG Chest Portable 1 View Result Date: 04/19/2024 CLINICAL DATA:  Sepsis.  Central line placement. EXAM: PORTABLE CHEST 1 VIEW COMPARISON:  04/19/2024 FINDINGS: New left jugular central venous catheter is seen with tip overlying the SVC. No pneumothorax visualized. Right jugular dual-lumen central venous dialysis catheter remains in appropriate position. Heart size is normal.  Both lungs are clear. IMPRESSION: New left jugular central venous catheter in appropriate position. No pneumothorax visualized. No active lung disease. Electronically Signed   By: Norleen DELENA Kil M.D.   On: 04/19/2024 18:31   DG Ankle Complete Right Result Date: 04/19/2024 CLINICAL DATA:  Evaluate for osteomyelitis. EXAM: RIGHT ANKLE - COMPLETE 3+ VIEW COMPARISON:  Right ankle radiograph dated 03/05/2024 FINDINGS: Distal tibia and fibular fixation. Fragmentation of the tip of the distal fibula new since the prior radiograph and suspicious for osteomyelitis. There is lucency adjacent to the lowermost fibular screw likely representing loosening. Degenerative changes and chronic deformity and fragmentation of the ankle. There is soft tissue air medial to the ankle as well as large amount of soft tissue gas in the distal calf posteriorly. In the absence of recent procedure findings concerning for an  infectious process and possibly necrotizing fasciitis. Clinical correlation recommended. Dressing noted over the lateral ankle. IMPRESSION: 1. Fragmentation of the tip of the distal  fibula new since the prior radiograph and suspicious for osteomyelitis. 2. Soft tissue air medial to the ankle as well as large amount of soft tissue gas in the distal calf posteriorly. In the absence of recent procedure findings concerning for an infectious process and possibly necrotizing fasciitis. Electronically Signed   By: Vanetta Chou M.D.   On: 04/19/2024 15:11   DG Chest Port 1 View Result Date: 04/19/2024 CLINICAL DATA:  Questionable sepsis. EXAM: PORTABLE CHEST 1 VIEW COMPARISON:  Chest CT dated 11/03/2023 FINDINGS: Dialysis catheter with tip at the cavoatrial junction. No focal consolidation, pleural effusion, or pneumothorax. Top-normal cardiac size. No acute osseous pathology. IMPRESSION: No active disease. Electronically Signed   By: Vanetta Chou M.D.   On: 04/19/2024 14:16   PERIPHERAL VASCULAR CATHETERIZATION Result Date: 04/15/2024 Images from the original result were not included. Patient name: MIYO AINA MRN: [id number] DOB: March 29, 1956 Sex: female 04/15/2024 Pre-operative Diagnosis: ESRD on HD, nonfunctioning TDC Post-operative diagnosis:  Same Surgeon:  Norman GORMAN Serve, MD Procedure Performed: Tunneled dialysis catheter exchange under fluoroscopic guidance Indications: Ms. Suminski is a 68 year old female with ESRD on HD who is presenting to the HD access center with a nonfunctioning catheter.  She has not been seen or evaluated for upper extremity permanent access yet but has an appointment with our office in about 2 weeks.  Risks benefits of tunneled dialysis catheter exchange were reviewed and she elected to proceed. Findings: Right IJ TDC exchange with tip placed at atriocaval junction.  Procedure:  The patient was identified in the holding area and taken to the cath lab  The patient was then placed supine on the table and prepped and draped in the usual sterile fashion.  A time out was called.  The catheter was visualized under fluoroscopy and a glide advantage wire was  placed through one of the ports and into the IVC. Using lidocaine  and blunt dissection the cuff was then freed from the surrounding tissue attachments. The catheter was then removed over the glide advantage wire leaving the wire in place in the IVC. The new 19 cm catheter was then tracked over this Glide vantage wire with its stylette in place. This was placed into the atriocaval junction. The stylette and wire were removed. Both ports aspirated and flushed with ease. They were heparin  locked and the catheter was fastened to the skin with a nylon suture. Norman GORMAN Serve MD [facility] Office: [phone]   MM 3D SCREENING MAMMOGRAM BILATERAL BREAST Result Date: 04/03/2024 CLINICAL DATA:  Screening. EXAM: DIGITAL SCREENING BILATERAL MAMMOGRAM WITH TOMOSYNTHESIS AND CAD TECHNIQUE: Bilateral screening digital craniocaudal and mediolateral oblique mammograms were obtained. Bilateral screening digital breast tomosynthesis was performed. The images were evaluated with computer-aided detection. COMPARISON:  Previous exam(s). ACR Breast Density Category c: The breasts are heterogeneously dense, which may obscure small masses. FINDINGS: There are no findings suspicious for malignancy. IMPRESSION: No mammographic evidence of malignancy. A result letter of this screening mammogram will be mailed directly to the patient. RECOMMENDATION: Screening mammogram in one year. (Code:SM-B-01Y) BI-RADS CATEGORY  1: Negative. Electronically Signed   By: Rosina Gelineau M.D.   On: 04/03/2024 11:01   (Echo, Carotid, EGD, Colonoscopy, ERCP)    Subjective: No complains  Discharge Exam: Vitals:   04/28/24 0403 04/28/24 0742  BP: (!) 100/51 (!) 107/53  Pulse:  78 80  Resp: 16   Temp: 98.1 F (36.7 C) 98.2 F (36.8 C)  SpO2: 100% 100%   Vitals:   04/27/24 2100 04/28/24 0033 04/28/24 0403 04/28/24 0742  BP: (!) 100/51 (!) 96/44 (!) 100/51 (!) 107/53  Pulse: (!) 58 74 78 80  Resp: 16 16 16     Temp: 97.6 F (36.4 C) 98.1 F (36.7 C) 98.1 F (36.7 C) 98.2 F (36.8 C)  TempSrc: Oral Oral Oral Oral  SpO2: 98% 93% 100% 100%  Weight:      Height:        General: Pt is alert, awake, not in acute distress Cardiovascular: RRR, S1/S2 +, no rubs, no gallops Respiratory: CTA bilaterally, no wheezing, no rhonchi Abdominal: Soft, NT, ND, bowel sounds + Extremities: no edema, no cyanosis    The results of significant diagnostics from this hospitalization (including imaging, microbiology, ancillary and laboratory) are listed below for reference.     Microbiology: Recent Results (from the past 240 hours)  Resp panel by RT-PCR (RSV, Flu A&B, Covid) Anterior Nasal Swab     Status: None   Collection Time: 04/19/24  1:41 PM   Specimen: Anterior Nasal Swab  Result Value Ref Range Status   SARS Coronavirus 2 by RT PCR NEGATIVE NEGATIVE Final   Influenza A by PCR NEGATIVE NEGATIVE Final   Influenza B by PCR NEGATIVE NEGATIVE Final    Comment: (NOTE) The Xpert Xpress SARS-CoV-2/FLU/RSV plus assay is intended as an aid in the diagnosis of influenza from Nasopharyngeal swab specimens and should not be used as a sole basis for treatment. Nasal washings and aspirates are unacceptable for Xpert Xpress SARS-CoV-2/FLU/RSV testing.  Fact Sheet for Patients: BloggerCourse.com  Fact Sheet for Healthcare Providers: SeriousBroker.it  This test is not yet approved or cleared by the [place]  FDA and has been authorized for detection and/or diagnosis of SARS-CoV-2 by FDA under an Emergency Use Authorization (EUA). This EUA will remain in effect (meaning this test can be used) for the duration of the COVID-19 declaration under Section 564(b)(1) of the Act, 21 U.S.C. section 360bbb-3(b)(1), unless the authorization is terminated or revoked.     Resp Syncytial Virus by PCR NEGATIVE NEGATIVE Final    Comment: (NOTE) Fact Sheet for  Patients: BloggerCourse.com  Fact Sheet for Healthcare Providers: SeriousBroker.it  This test is not yet approved or cleared by the [place]  FDA and has been authorized for detection and/or diagnosis of SARS-CoV-2 by FDA under an Emergency Use Authorization (EUA). This EUA will remain in effect (meaning this test can be used) for the duration of the COVID-19 declaration under Section 564(b)(1) of the Act, 21 U.S.C. section 360bbb-3(b)(1), unless the authorization is terminated or revoked.  Performed at [facility], [address] [place], [place] [address]   Blood Culture (routine x 2)     Status: Abnormal   Collection Time: 04/19/24  1:41 PM   Specimen: BLOOD  Result Value Ref Range Status   Specimen Description BLOOD LEFT ANTECUBITAL  Final   Special Requests   Final    BOTTLES DRAWN AEROBIC AND ANAEROBIC Blood Culture results may not be optimal due to an inadequate volume of blood received in culture bottles   Culture  Setup Time   Final    GRAM POSITIVE COCCI IN BOTH AEROBIC AND ANAEROBIC BOTTLES CRITICAL VALUE NOTED.  VALUE IS CONSISTENT WITH PREVIOUSLY REPORTED AND CALLED VALUE. Performed at [facility], [address], [place], [place] [address]  Culture (A)  Final    STAPHYLOCOCCUS AUREUS SUSCEPTIBILITIES PERFORMED ON PREVIOUS CULTURE WITHIN THE LAST 5 DAYS. GROUP B STREP(S.AGALACTIAE)ISOLATED    Report Status 04/23/2024 FINAL  Final   Organism ID, Bacteria GROUP B STREP(S.AGALACTIAE)ISOLATED  Final      Susceptibility   Group b strep(s.agalactiae)isolated - MIC*    CLINDAMYCIN  >=1 RESISTANT Resistant     AMPICILLIN <=0.25 SENSITIVE Sensitive     ERYTHROMYCIN  >=8 RESISTANT Resistant     VANCOMYCIN  0.5 SENSITIVE Sensitive     CEFTRIAXONE <=0.12 SENSITIVE Sensitive     LEVOFLOXACIN 1 SENSITIVE Sensitive     PENICILLIN <=0.06 SENSITIVE Sensitive     * GROUP B STREP(S.AGALACTIAE)ISOLATED  Blood  Culture (routine x 2)     Status: Abnormal   Collection Time: 04/19/24  1:46 PM   Specimen: BLOOD  Result Value Ref Range Status   Specimen Description BLOOD RIGHT ANTECUBITAL  Final   Special Requests   Final    BOTTLES DRAWN AEROBIC AND ANAEROBIC Blood Culture results may not be optimal due to an inadequate volume of blood received in culture bottles   Culture  Setup Time   Final    GRAM POSITIVE COCCI IN BOTH AEROBIC AND ANAEROBIC BOTTLES CRITICAL RESULT CALLED TO, READ BACK BY AND VERIFIED WITH: PHARMD G ABBOTT 04/20/2024 @ 0539 BY AB    Culture (A)  Final    METHICILLIN RESISTANT STAPHYLOCOCCUS AUREUS GBS REPORTED FROM BCID NOT RECOVERED IN CULTURE Performed at [facility], [address] [place], [place] [address]    Report Status 04/25/2024 FINAL  Final   Organism ID, Bacteria METHICILLIN RESISTANT STAPHYLOCOCCUS AUREUS  Final      Susceptibility   Methicillin resistant staphylococcus aureus - MIC*    CIPROFLOXACIN  >=8 RESISTANT Resistant     ERYTHROMYCIN  >=8 RESISTANT Resistant     GENTAMICIN  <=0.5 SENSITIVE Sensitive     OXACILLIN >=4 RESISTANT Resistant     TETRACYCLINE <=1 SENSITIVE Sensitive     VANCOMYCIN  1 SENSITIVE Sensitive     TRIMETH/SULFA <=10 SENSITIVE Sensitive     CLINDAMYCIN  >=8 RESISTANT Resistant     RIFAMPIN <=0.5 SENSITIVE Sensitive     Inducible Clindamycin  NEGATIVE Sensitive     LINEZOLID 2 SENSITIVE Sensitive     * METHICILLIN RESISTANT STAPHYLOCOCCUS AUREUS  Blood Culture ID Panel (Reflexed)     Status: Abnormal   Collection Time: 04/19/24  1:46 PM  Result Value Ref Range Status   Enterococcus faecalis NOT DETECTED NOT DETECTED Final   Enterococcus Faecium NOT DETECTED NOT DETECTED Final   Listeria monocytogenes NOT DETECTED NOT DETECTED Final   Staphylococcus species DETECTED (A) NOT DETECTED Final    Comment: CRITICAL RESULT CALLED TO, READ BACK BY AND VERIFIED WITH: PHARMD G ABBOTT 04/20/2024 @ 0539 BY AB    Staphylococcus aureus  (BCID) DETECTED (A) NOT DETECTED Final    Comment: Methicillin (oxacillin)-resistant Staphylococcus aureus (MRSA). MRSA is predictably resistant to beta-lactam antibiotics (except ceftaroline). Preferred therapy is vancomycin  unless clinically contraindicated. Patient requires contact precautions if  hospitalized. CRITICAL RESULT CALLED TO, READ BACK BY AND VERIFIED WITH: PHARMD G ABBOTT 04/20/2024 @ 0539 BY AB    Staphylococcus epidermidis NOT DETECTED NOT DETECTED Final   Staphylococcus lugdunensis NOT DETECTED NOT DETECTED Final   Streptococcus species DETECTED (A) NOT DETECTED Final    Comment: CRITICAL RESULT CALLED TO, READ BACK BY AND VERIFIED WITH: PHARMD G ABBOTT 04/20/2024 @ 0539 BY AB    Streptococcus agalactiae DETECTED (A) NOT DETECTED Final  Comment: CRITICAL RESULT CALLED TO, READ BACK BY AND VERIFIED WITH: PHARMD G ABBOTT 04/20/2024 @ 0539 BY AB    Streptococcus pneumoniae NOT DETECTED NOT DETECTED Final   Streptococcus pyogenes NOT DETECTED NOT DETECTED Final   A.calcoaceticus-baumannii NOT DETECTED NOT DETECTED Final   Bacteroides fragilis NOT DETECTED NOT DETECTED Final   Enterobacterales NOT DETECTED NOT DETECTED Final   Enterobacter cloacae complex NOT DETECTED NOT DETECTED Final   Escherichia coli NOT DETECTED NOT DETECTED Final   Klebsiella aerogenes NOT DETECTED NOT DETECTED Final   Klebsiella oxytoca NOT DETECTED NOT DETECTED Final   Klebsiella pneumoniae NOT DETECTED NOT DETECTED Final   Proteus species NOT DETECTED NOT DETECTED Final   Salmonella species NOT DETECTED NOT DETECTED Final   Serratia marcescens NOT DETECTED NOT DETECTED Final   Haemophilus influenzae NOT DETECTED NOT DETECTED Final   Neisseria meningitidis NOT DETECTED NOT DETECTED Final   Pseudomonas aeruginosa NOT DETECTED NOT DETECTED Final   Stenotrophomonas maltophilia NOT DETECTED NOT DETECTED Final   Candida albicans NOT DETECTED NOT DETECTED Final   Candida auris NOT DETECTED NOT  DETECTED Final   Candida glabrata NOT DETECTED NOT DETECTED Final   Candida krusei NOT DETECTED NOT DETECTED Final   Candida parapsilosis NOT DETECTED NOT DETECTED Final   Candida tropicalis NOT DETECTED NOT DETECTED Final   Cryptococcus neoformans/gattii NOT DETECTED NOT DETECTED Final   Meth resistant mecA/C and MREJ DETECTED (A) NOT DETECTED Final    Comment: CRITICAL RESULT CALLED TO, READ BACK BY AND VERIFIED WITH: PHARMD G ABBOTT 04/20/2024 @ 0539 BY AB Performed at [facility], [address], [place], [place] [address]   MRSA Next Gen by PCR, Nasal     Status: None   Collection Time: 04/19/24  7:17 PM   Specimen: Nasal Mucosa; Nasal Swab  Result Value Ref Range Status   MRSA by PCR Next Gen NOT DETECTED NOT DETECTED Final    Comment: (NOTE) The GeneXpert MRSA Assay (FDA approved for NASAL specimens only), is one component of a comprehensive MRSA colonization surveillance program. It is not intended to diagnose MRSA infection nor to guide or monitor treatment for MRSA infections. Test performance is not FDA approved in patients less than 51 years old. Performed at [facility], [address], [place], [place] [address]   Aerobic/Anaerobic Culture w Gram Stain (surgical/deep wound)     Status: None   Collection Time: 04/20/24  6:22 PM   Specimen: Soft Tissue, Other  Result Value Ref Range Status   Specimen Description TISSUE  Final   Special Requests RIGHT LEG  Final   Gram Stain NO WBC SEEN NO ORGANISMS SEEN   Final   Culture   Final    No growth aerobically or anaerobically. Performed at [facility], [address] [place], [place] [address]    Report Status 04/25/2024 FINAL  Final  Culture, blood (Routine X 2) w Reflex to ID Panel     Status: None   Collection Time: 04/21/24  6:16 AM   Specimen: BLOOD  Result Value Ref Range Status   Specimen Description BLOOD BLOOD LEFT HAND  Final   Special Requests   Final    BOTTLES DRAWN AEROBIC AND  ANAEROBIC Blood Culture adequate volume   Culture   Final    NO GROWTH 5 DAYS Performed at [facility], [address], [place], [place] [address]    Report Status 04/26/2024 FINAL  Final  Culture, blood (  Routine X 2) w Reflex to ID Panel     Status: None   Collection Time: 04/21/24  6:21 AM   Specimen: BLOOD  Result Value Ref Range Status   Specimen Description BLOOD BLOOD RIGHT ARM  Final   Special Requests   Final    BOTTLES DRAWN AEROBIC AND ANAEROBIC Blood Culture adequate volume   Culture   Final    NO GROWTH 5 DAYS Performed at [facility], [address] [place], [place] [address]    Report Status 04/26/2024 FINAL  Final     Labs: BNP (last 3 results) Recent Labs    11/04/23 0209 11/05/23 0247 11/06/23 0345  BNP 74.4 179.0* 244.4*   Basic Metabolic Panel: Recent Labs  Lab 04/22/24 0500 04/23/24 0852 04/24/24 0656 04/25/24 0500 04/26/24 0815 04/27/24 0416  NA 126* 125* 129* 129*  127* 131* 129*  K 3.4* 3.2* 3.1* 4.6  4.5 3.9 4.4  CL 94* 95* 95* 94*  94* 96* 95*  CO2 21* 20* 25 23  23 25 25   GLUCOSE 142* 62* 133* 262*  257* 97 189*  BUN 74* 85* 35* 50*  51* 32* 43*  CREATININE 7.33* 8.26* 4.21* 5.42*  5.38* 4.13* 4.84*  CALCIUM  7.6* 7.2* 7.1* 7.1*  6.9* 7.0* 7.1*  MG  --   --   --  1.8  --   --   PHOS 4.3  --   --  3.5  --  2.9   Liver Function Tests: Recent Labs  Lab 04/22/24 0500 04/25/24 0500  ALBUMIN  <1.5* <1.5*   No results for input(s): LIPASE, AMYLASE in the last 168 hours. No results for input(s): AMMONIA in the last 168 hours. CBC: Recent Labs  Lab 04/22/24 0500 04/23/24 0852 04/25/24 0712 04/26/24 0815 04/27/24 0800  WBC 17.8* 16.4* 19.2* 19.5* 20.7*  HGB 7.7* 7.5* 7.4* 7.2* 7.1*  HCT 23.7* 23.5* 23.3* 22.8* 22.0*  MCV 79.5* 80.2 80.3 79.7* 80.3  PLT 194 136* 83* 97* 124*   Cardiac Enzymes: No results for input(s): CKTOTAL, CKMB, CKMBINDEX, TROPONINI in the last 168 hours. BNP: Invalid  input(s): POCBNP CBG: Recent Labs  Lab 04/26/24 2241 04/27/24 1747 04/27/24 2100 04/27/24 2240 04/28/24 0744  GLUCAP 146* 255* 236* 205* 179*   D-Dimer No results for input(s): DDIMER in the last 72 hours. Hgb A1c No results for input(s): HGBA1C in the last 72 hours. Lipid Profile No results for input(s): CHOL, HDL, LDLCALC, TRIG, CHOLHDL, LDLDIRECT in the last 72 hours. Thyroid  function studies No results for input(s): TSH, T4TOTAL, T3FREE, THYROIDAB in the last 72 hours.  Invalid input(s): FREET3 Anemia work up No results for input(s): VITAMINB12, FOLATE, FERRITIN, TIBC, IRON , RETICCTPCT in the last 72 hours. Urinalysis    Component Value Date/Time   COLORURINE YELLOW 10/28/2023 0416   APPEARANCEUR HAZY (A) 10/28/2023 0416   LABSPEC 1.010 10/28/2023 0416   PHURINE 7.0 10/28/2023 0416   GLUCOSEU 150 (A) 10/28/2023 0416   GLUCOSEU NEGATIVE 05/05/2021 1228   HGBUR NEGATIVE 10/28/2023 0416   BILIRUBINUR NEGATIVE 10/28/2023 0416   KETONESUR NEGATIVE 10/28/2023 0416   PROTEINUR >=300 (A) 10/28/2023 0416   UROBILINOGEN 0.2 05/05/2021 1228   NITRITE NEGATIVE 10/28/2023 0416   LEUKOCYTESUR MODERATE (A) 10/28/2023 0416   Sepsis Labs Recent Labs  Lab 04/23/24 0852 04/25/24 0712 04/26/24 0815 04/27/24 0800  WBC 16.4* 19.2* 19.5* 20.7*   Microbiology Recent Results (from the past 240 hours)  Resp panel by RT-PCR (RSV, Flu A&B, Covid) Anterior Nasal  Swab     Status: None   Collection Time: 04/19/24  1:41 PM   Specimen: Anterior Nasal Swab  Result Value Ref Range Status   SARS Coronavirus 2 by RT PCR NEGATIVE NEGATIVE Final   Influenza A by PCR NEGATIVE NEGATIVE Final   Influenza B by PCR NEGATIVE NEGATIVE Final    Comment: (NOTE) The Xpert Xpress SARS-CoV-2/FLU/RSV plus assay is intended as an aid in the diagnosis of influenza from Nasopharyngeal swab specimens and should not be used as a sole basis for treatment. Nasal washings  and aspirates are unacceptable for Xpert Xpress SARS-CoV-2/FLU/RSV testing.  Fact Sheet for Patients: BloggerCourse.com  Fact Sheet for Healthcare Providers: SeriousBroker.it  This test is not yet approved or cleared by the [place]  FDA and has been authorized for detection and/or diagnosis of SARS-CoV-2 by FDA under an Emergency Use Authorization (EUA). This EUA will remain in effect (meaning this test can be used) for the duration of the COVID-19 declaration under Section 564(b)(1) of the Act, 21 U.S.C. section 360bbb-3(b)(1), unless the authorization is terminated or revoked.     Resp Syncytial Virus by PCR NEGATIVE NEGATIVE Final    Comment: (NOTE) Fact Sheet for Patients: BloggerCourse.com  Fact Sheet for Healthcare Providers: SeriousBroker.it  This test is not yet approved or cleared by the [place]  FDA and has been authorized for detection and/or diagnosis of SARS-CoV-2 by FDA under an Emergency Use Authorization (EUA). This EUA will remain in effect (meaning this test can be used) for the duration of the COVID-19 declaration under Section 564(b)(1) of the Act, 21 U.S.C. section 360bbb-3(b)(1), unless the authorization is terminated or revoked.  Performed at [facility], [address] [place], [place] [address]   Blood Culture (routine x 2)     Status: Abnormal   Collection Time: 04/19/24  1:41 PM   Specimen: BLOOD  Result Value Ref Range Status   Specimen Description BLOOD LEFT ANTECUBITAL  Final   Special Requests   Final    BOTTLES DRAWN AEROBIC AND ANAEROBIC Blood Culture results may not be optimal due to an inadequate volume of blood received in culture bottles   Culture  Setup Time   Final    GRAM POSITIVE COCCI IN BOTH AEROBIC AND ANAEROBIC BOTTLES CRITICAL VALUE NOTED.  VALUE IS CONSISTENT WITH PREVIOUSLY REPORTED AND CALLED  VALUE. Performed at [facility], [address], [place], [place] [address]    Culture (A)  Final    STAPHYLOCOCCUS AUREUS SUSCEPTIBILITIES PERFORMED ON PREVIOUS CULTURE WITHIN THE LAST 5 DAYS. GROUP B STREP(S.AGALACTIAE)ISOLATED    Report Status 04/23/2024 FINAL  Final   Organism ID, Bacteria GROUP B STREP(S.AGALACTIAE)ISOLATED  Final      Susceptibility   Group b strep(s.agalactiae)isolated - MIC*    CLINDAMYCIN  >=1 RESISTANT Resistant     AMPICILLIN <=0.25 SENSITIVE Sensitive     ERYTHROMYCIN  >=8 RESISTANT Resistant     VANCOMYCIN  0.5 SENSITIVE Sensitive     CEFTRIAXONE <=0.12 SENSITIVE Sensitive     LEVOFLOXACIN 1 SENSITIVE Sensitive     PENICILLIN <=0.06 SENSITIVE Sensitive     * GROUP B STREP(S.AGALACTIAE)ISOLATED  Blood Culture (routine x 2)     Status: Abnormal   Collection Time: 04/19/24  1:46 PM   Specimen: BLOOD  Result Value Ref Range Status   Specimen Description BLOOD RIGHT ANTECUBITAL  Final   Special Requests   Final    BOTTLES DRAWN AEROBIC AND ANAEROBIC Blood Culture results may not be optimal due to  an inadequate volume of blood received in culture bottles   Culture  Setup Time   Final    GRAM POSITIVE COCCI IN BOTH AEROBIC AND ANAEROBIC BOTTLES CRITICAL RESULT CALLED TO, READ BACK BY AND VERIFIED WITH: PHARMD G ABBOTT 04/20/2024 @ 0539 BY AB    Culture (A)  Final    METHICILLIN RESISTANT STAPHYLOCOCCUS AUREUS GBS REPORTED FROM BCID NOT RECOVERED IN CULTURE Performed at [facility], [address], [place], [place] [address]    Report Status 04/25/2024 FINAL  Final   Organism ID, Bacteria METHICILLIN RESISTANT STAPHYLOCOCCUS AUREUS  Final      Susceptibility   Methicillin resistant staphylococcus aureus - MIC*    CIPROFLOXACIN  >=8 RESISTANT Resistant     ERYTHROMYCIN  >=8 RESISTANT Resistant     GENTAMICIN  <=0.5 SENSITIVE Sensitive     OXACILLIN >=4 RESISTANT Resistant     TETRACYCLINE <=1 SENSITIVE Sensitive     VANCOMYCIN  1 SENSITIVE  Sensitive     TRIMETH/SULFA <=10 SENSITIVE Sensitive     CLINDAMYCIN  >=8 RESISTANT Resistant     RIFAMPIN <=0.5 SENSITIVE Sensitive     Inducible Clindamycin  NEGATIVE Sensitive     LINEZOLID 2 SENSITIVE Sensitive     * METHICILLIN RESISTANT STAPHYLOCOCCUS AUREUS  Blood Culture ID Panel (Reflexed)     Status: Abnormal   Collection Time: 04/19/24  1:46 PM  Result Value Ref Range Status   Enterococcus faecalis NOT DETECTED NOT DETECTED Final   Enterococcus Faecium NOT DETECTED NOT DETECTED Final   Listeria monocytogenes NOT DETECTED NOT DETECTED Final   Staphylococcus species DETECTED (A) NOT DETECTED Final    Comment: CRITICAL RESULT CALLED TO, READ BACK BY AND VERIFIED WITH: PHARMD G ABBOTT 04/20/2024 @ 0539 BY AB    Staphylococcus aureus (BCID) DETECTED (A) NOT DETECTED Final    Comment: Methicillin (oxacillin)-resistant Staphylococcus aureus (MRSA). MRSA is predictably resistant to beta-lactam antibiotics (except ceftaroline). Preferred therapy is vancomycin  unless clinically contraindicated. Patient requires contact precautions if  hospitalized. CRITICAL RESULT CALLED TO, READ BACK BY AND VERIFIED WITH: PHARMD G ABBOTT 04/20/2024 @ 0539 BY AB    Staphylococcus epidermidis NOT DETECTED NOT DETECTED Final   Staphylococcus lugdunensis NOT DETECTED NOT DETECTED Final   Streptococcus species DETECTED (A) NOT DETECTED Final    Comment: CRITICAL RESULT CALLED TO, READ BACK BY AND VERIFIED WITH: PHARMD G ABBOTT 04/20/2024 @ 0539 BY AB    Streptococcus agalactiae DETECTED (A) NOT DETECTED Final    Comment: CRITICAL RESULT CALLED TO, READ BACK BY AND VERIFIED WITH: PHARMD G ABBOTT 04/20/2024 @ 0539 BY AB    Streptococcus pneumoniae NOT DETECTED NOT DETECTED Final   Streptococcus pyogenes NOT DETECTED NOT DETECTED Final   A.calcoaceticus-baumannii NOT DETECTED NOT DETECTED Final   Bacteroides fragilis NOT DETECTED NOT DETECTED Final   Enterobacterales NOT DETECTED NOT DETECTED Final    Enterobacter cloacae complex NOT DETECTED NOT DETECTED Final   Escherichia coli NOT DETECTED NOT DETECTED Final   Klebsiella aerogenes NOT DETECTED NOT DETECTED Final   Klebsiella oxytoca NOT DETECTED NOT DETECTED Final   Klebsiella pneumoniae NOT DETECTED NOT DETECTED Final   Proteus species NOT DETECTED NOT DETECTED Final   Salmonella species NOT DETECTED NOT DETECTED Final   Serratia marcescens NOT DETECTED NOT DETECTED Final   Haemophilus influenzae NOT DETECTED NOT DETECTED Final   Neisseria meningitidis NOT DETECTED NOT DETECTED Final   Pseudomonas aeruginosa NOT DETECTED NOT DETECTED Final   Stenotrophomonas maltophilia NOT DETECTED NOT DETECTED Final   Candida albicans NOT  DETECTED NOT DETECTED Final   Candida auris NOT DETECTED NOT DETECTED Final   Candida glabrata NOT DETECTED NOT DETECTED Final   Candida krusei NOT DETECTED NOT DETECTED Final   Candida parapsilosis NOT DETECTED NOT DETECTED Final   Candida tropicalis NOT DETECTED NOT DETECTED Final   Cryptococcus neoformans/gattii NOT DETECTED NOT DETECTED Final   Meth resistant mecA/C and MREJ DETECTED (A) NOT DETECTED Final    Comment: CRITICAL RESULT CALLED TO, READ BACK BY AND VERIFIED WITH: PHARMD G ABBOTT 04/20/2024 @ 0539 BY AB Performed at [facility], [address] [place], [place] [address]   MRSA Next Gen by PCR, Nasal     Status: None   Collection Time: 04/19/24  7:17 PM   Specimen: Nasal Mucosa; Nasal Swab  Result Value Ref Range Status   MRSA by PCR Next Gen NOT DETECTED NOT DETECTED Final    Comment: (NOTE) The GeneXpert MRSA Assay (FDA approved for NASAL specimens only), is one component of a comprehensive MRSA colonization surveillance program. It is not intended to diagnose MRSA infection nor to guide or monitor treatment for MRSA infections. Test performance is not FDA approved in patients less than 95 years old. Performed at [facility], [address] [place], [place] [address]    Aerobic/Anaerobic Culture w Gram Stain (surgical/deep wound)     Status: None   Collection Time: 04/20/24  6:22 PM   Specimen: Soft Tissue, Other  Result Value Ref Range Status   Specimen Description TISSUE  Final   Special Requests RIGHT LEG  Final   Gram Stain NO WBC SEEN NO ORGANISMS SEEN   Final   Culture   Final    No growth aerobically or anaerobically. Performed at [facility], [address] [place], [place] [address]    Report Status 04/25/2024 FINAL  Final  Culture, blood (Routine X 2) w Reflex to ID Panel     Status: None   Collection Time: 04/21/24  6:16 AM   Specimen: BLOOD  Result Value Ref Range Status   Specimen Description BLOOD BLOOD LEFT HAND  Final   Special Requests   Final    BOTTLES DRAWN AEROBIC AND ANAEROBIC Blood Culture adequate volume   Culture   Final    NO GROWTH 5 DAYS Performed at [facility], [address], [place], [place] [address]    Report Status 04/26/2024 FINAL  Final  Culture, blood (Routine X 2) w Reflex to ID Panel     Status: None   Collection Time: 04/21/24  6:21 AM   Specimen: BLOOD  Result Value Ref Range Status   Specimen Description BLOOD BLOOD RIGHT ARM  Final   Special Requests   Final    BOTTLES DRAWN AEROBIC AND ANAEROBIC Blood Culture adequate volume   Culture   Final    NO GROWTH 5 DAYS Performed at [facility], [address] [place], [place] [address]    Report Status 04/26/2024 FINAL  Final     Time coordinating discharge: Over 30 minutes  SIGNED:   Erle Odell Castor, MD  [facility] 04/28/2024, 9:19 AM Pager   If 7PM-7AM, please contact night-coverage [email or web address] Password [email or web address]

## 2024-04-27 NOTE — TOC Progression Note (Signed)
 Transition of Care [facility]) - Progression Note    Patient Details  Name: [name] MRN: [id number] Date of Birth: 1955/12/15  [facility]) CM/SW Contact  Almarie CHRISTELLA [place], [place] Phone Number: 04/27/2024, 10:37 AM  Clinical Narrative:   CSW following for SNF placement. [facility] has a bed available today, if patient receives Therapist, occupational. PT/OT to see after patient returns from HD, and CSW to request authorization. CSW to follow.  UPDATE: CSW notified CMA that updated therapy notes were completed, and requested insurance authorization. Patient's [employer] is not managed by [facility]. CSW notified [facility], they will initiate authorization request. CSW to follow.    Expected Discharge Plan: Skilled Nursing Facility Barriers to Discharge: Continued Medical Work up, Other (must enter comment), English as a second language teacher (SNF pending bed decision)               Expected Discharge Plan and Services In-house Referral: Clinical Social Work   Post Acute Care Choice: Skilled Nursing Facility Living arrangements for the past 2 months: Single Family Home                                       Social Drivers of Health (SDOH) Interventions SDOH Screenings   Food Insecurity: No Food Insecurity (04/19/2024)  Housing: Low Risk  (04/19/2024)  Transportation Needs: No Transportation Needs (04/19/2024)  Utilities: Not At Risk (04/19/2024)  Depression (PHQ2-9): Low Risk  (02/28/2021)  Financial Resource Strain: Low Risk  (08/25/2021)   Received from [facility]  Physical Activity: Insufficiently Active (06/20/2021)   Received from [facility]  Social Connections: Socially Isolated (04/19/2024)  Stress: No Stress Concern Present (08/25/2021)   Received from [facility]  Tobacco Use: Low Risk  (03/26/2024)    Readmission Risk Interventions     No data to display

## 2024-04-27 NOTE — Discharge Planning (Signed)
 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 Bath Change: no change  ANEMIA MANAGEMENT: Aranesp : Given: no    ESA dose for discharge: mircera 150 mcg IV q 2 weeks, to start on 05/01/24 IV Iron  dose at discharge: per protocol Transfusion: Given: none given  BONE/MINERAL MEDICATIONS: Hectorol/Calcitriol  change: no change - continue 0.25 mcg calcitriol  q HD Sensipar/Parsabiv change: per protocol  ACCESS INTERVENTION/CHANGE: [facility] right chest Details:   RECENT LABS: Recent Labs  Lab 04/25/24 0500 04/25/24 0712 04/27/24 0416 04/27/24 0800  HGB  --    < >  --  7.1*  NA 129*  127*   < > 129*  --   K 4.6  4.5   < > 4.4  --   CALCIUM  7.1*  6.9*   < > 7.1*  --   PHOS 3.5  --  2.9  --   ALBUMIN  <1.5*  --   --   --    < > = values in this interval not displayed.    IV ANTIBIOTICS: Yes.  Details: IV Vanc 750 three times per week post HD. Last day of treatment 06/16/24    OTHER/APPTS/LAB ORDERS: Please draw Vanc trough weekly at HD.    D/C Meds to be reconciled by nurse after every discharge.  Completed By:    Reviewed by: MD:______ RN_______

## 2024-04-28 ENCOUNTER — Inpatient Hospital Stay (HOSPITAL_COMMUNITY): Admission: RE | Admit: 2024-04-28 | Source: Ambulatory Visit

## 2024-04-28 ENCOUNTER — Inpatient Hospital Stay (HOSPITAL_COMMUNITY)

## 2024-04-28 DIAGNOSIS — N186 End stage renal disease: Secondary | ICD-10-CM | POA: Diagnosis not present

## 2024-04-28 DIAGNOSIS — A419 Sepsis, unspecified organism: Secondary | ICD-10-CM | POA: Diagnosis not present

## 2024-04-28 DIAGNOSIS — R7881 Bacteremia: Secondary | ICD-10-CM

## 2024-04-28 DIAGNOSIS — M869 Osteomyelitis, unspecified: Secondary | ICD-10-CM | POA: Diagnosis not present

## 2024-04-28 DIAGNOSIS — R6521 Severe sepsis with septic shock: Secondary | ICD-10-CM | POA: Diagnosis not present

## 2024-04-28 DIAGNOSIS — M726 Necrotizing fasciitis: Secondary | ICD-10-CM | POA: Diagnosis not present

## 2024-04-28 LAB — GLUCOSE, CAPILLARY
Glucose-Capillary: 179 mg/dL — ABNORMAL HIGH (ref 70–99)
Glucose-Capillary: 238 mg/dL — ABNORMAL HIGH (ref 70–99)
Glucose-Capillary: 71 mg/dL (ref 70–99)

## 2024-04-28 MED ORDER — APIXABAN 5 MG PO TABS: ORAL_TABLET | ORAL | Status: DC

## 2024-04-28 NOTE — Progress Notes (Signed)
 [facility] Progress Note   Subjective:  Patient seen in her room today. She had HD yesterday and she reports that it went well. They were able to successfully UF 3.2L off of her. She is awaiting d/c, hopefully today. BP stable today.   Objective Vitals:   04/27/24 2100 04/28/24 0033 04/28/24 0403 04/28/24 0742  BP: (!) 100/51 (!) 96/44 (!) 100/51 (!) 107/53  Pulse: (!) 58 74 78 80  Resp: 16 16 16    Temp: 97.6 F (36.4 C) 98.1 F (36.7 C) 98.1 F (36.7 C) 98.2 F (36.8 C)  TempSrc: Oral Oral Oral Oral  SpO2: 98% 93% 100% 100%  Weight:      Height:       Physical Exam General: Well appearing woman, NAD. Room air.  Heart: RRR; no murmur Lungs: CTAB Abdomen: soft Extremities: 1+ BLE and BUE edema Dialysis Access: TDC in R chest  Additional Objective Labs: Basic Metabolic Panel: Recent Labs  Lab 04/22/24 0500 04/23/24 0852 04/25/24 0500 04/26/24 0815 04/27/24 0416  NA 126*   < > 129*  127* 131* 129*  K 3.4*   < > 4.6  4.5 3.9 4.4  CL 94*   < > 94*  94* 96* 95*  CO2 21*   < > 23  23 25 25   GLUCOSE 142*   < > 262*  257* 97 189*  BUN 74*   < > 50*  51* 32* 43*  CREATININE 7.33*   < > 5.42*  5.38* 4.13* 4.84*  CALCIUM  7.6*   < > 7.1*  6.9* 7.0* 7.1*  PHOS 4.3  --  3.5  --  2.9   < > = values in this interval not displayed.   Liver Function Tests: Recent Labs  Lab 04/22/24 0500 04/25/24 0500  ALBUMIN  <1.5* <1.5*   CBC: Recent Labs  Lab 04/22/24 0500 04/23/24 0852 04/25/24 0712 04/26/24 0815 04/27/24 0800  WBC 17.8* 16.4* 19.2* 19.5* 20.7*  HGB 7.7* 7.5* 7.4* 7.2* 7.1*  HCT 23.7* 23.5* 23.3* 22.8* 22.0*  MCV 79.5* 80.2 80.3 79.7* 80.3  PLT 194 136* 83* 97* 124*     Medications:  vancomycin  750 mg (04/27/24 1040)    (feeding supplement) PROSource Plus  30 mL Oral BID BM   apixaban   10 mg Oral BID   Followed by   NOREEN ON 05/01/2024] apixaban   5 mg Oral BID   vitamin C   1,000 mg Oral Daily   brimonidine   1 drop Both Eyes BID    calcitRIOL   0.25 mcg Oral Q M,W,F-1800   Chlorhexidine  Gluconate Cloth  6 each Topical Q0600   insulin  aspart  0-5 Units Subcutaneous QHS   insulin  aspart  0-6 Units Subcutaneous TID WC   insulin  aspart  2 Units Subcutaneous TID WC   insulin  glargine-yfgn  5 Units Subcutaneous BID   latanoprost   1 drop Both Eyes QHS   midodrine   10 mg Oral TID WC   nutrition supplement (JUVEN)  1 packet Oral BID BM   sodium chloride  flush  10-40 mL Intracatheter Q12H   zinc  sulfate (50mg  elemental zinc )  220 mg Oral Daily    Dialysis Orders MWF - [facility] 4hr, 350/A1.5, EDW 69.5kg, 3K/2.5Ca bath, TDC, heparin  2500 unit bolus - Leaving under EDW - Mircera 100mcg Ivq 2 weeks (last 7/25) - calcitriol  0.68mcg PO q HD   Assessment/Plan: 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 S/p R BKA. On 8 week course of Vanc ESRD: Usual MWF  schedule - delayed HD this week due to line holiday (7/29-7/31), then HD 8/1, 8/2. Next HD 04/29/24 HypoTN/volume: BP stable on midodrine , does have edema - UF as tolerated today. R internal jugular DVT: On eliquis . 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 Next ESA due 05/01/24. Secondary HPTH: CorrCa ok, Phos ok - have resumed calcitriol , not on binders. Diet controlled.  Nutrition: Alb very low - continue protein supplements. T2DM: Insulin  per primary.  Dispo: planned d/c today.    Belvie Och, NP 04/28/2024, 1:27 PM  [facility]

## 2024-04-28 NOTE — Progress Notes (Signed)
 Contacted [facility] to inform of pt d/c and to anticipate pt to resume schedule.   Harlo Fabela Dialysis navigator [phone]

## 2024-04-28 NOTE — Plan of Care (Signed)

## 2024-04-28 NOTE — TOC Transition Note (Addendum)
 Transition of Care [facility]) - Discharge Note   Patient Details  Name: [name] MRN: [id number] Date of Birth: March 18, 1956  [facility] CM/SW Contact:  Lauraine FORBES Saa, LCSW Phone Number: 04/28/2024, 12:21 PM   Clinical Narrative:     Patient will DC to: [facility] Anticipated DC date: 04/28/2024 Family notified: [name] Neighbors; Son; [phone] Transport by: [place]   Per [facility] admissions, patient's insurance authorization was approved today. Per MD patient ready for DC to [facility]. RN to call report prior to discharge [phone]). RN, patient, patient's family (to be called by patient), and facility notified of DC. Discharge Summary and FL2 sent to facility. DC packet on chart. Ambulance transport requested for patient at 12:20.   CSW will sign off for now as social work intervention is no longer needed. Please consult us  again if new needs arise.    Final next level of care: Skilled Nursing Facility Barriers to Discharge: Barriers Resolved   Patient Goals and CMS Choice Patient states their goals for this hospitalization and ongoing recovery are:: SNF CMS Medicare.gov Compare Post Acute Care list provided to:: Patient Choice offered to / list presented to : Patient      Discharge Placement              Patient chooses bed at: Other - please specify in the comment section below: [facility]) Patient to be transferred to facility by: [facility] Name of family member notified: [name] Neighbors; Son; [phone] Patient and family notified of of transfer: 04/28/24  Discharge Plan and Services Additional resources added to the After Visit Summary for   In-house Referral: Clinical Social Work   Post Acute Care Choice: Skilled Nursing Facility                               Social Drivers of Health (SDOH) Interventions SDOH Screenings   Food Insecurity: No Food Insecurity (04/19/2024)  Housing: Low Risk  (04/19/2024)   Transportation Needs: No Transportation Needs (04/19/2024)  Utilities: Not At Risk (04/19/2024)  Depression (PHQ2-9): Low Risk  (02/28/2021)  Financial Resource Strain: Low Risk  (08/25/2021)   Received from [facility]  Physical Activity: Insufficiently Active (06/20/2021)   Received from [facility]  Social Connections: Socially Isolated (04/19/2024)  Stress: No Stress Concern Present (08/25/2021)   Received from [facility]  Tobacco Use: Low Risk  (03/26/2024)     Readmission Risk Interventions     No data to display

## 2024-04-29 NOTE — Discharge Planning (Signed)
 [facility] Patient Discharge Orders - [facility] CLINIC: [facility]  Patient's name: [name] Admit/DC Dates: 04/19/2024 - 04/28/2024  DISCHARGE DIAGNOSES: MRSA bacteremia with RLE osteo: S/p BKA. On 8 week course of Vanc   ESRD Hypotension: continue Midodrine  for BP support  HD ORDER CHANGES: Heparin  change: no EDW Change: Yes  New EDW: 73 kg Bath Change: no change  ANEMIA MANAGEMENT: Aranesp : Given: no    ESA dose for discharge: mircera 150 mcg IV q 2 weeks, to start on 05/01/24 IV Iron  dose at discharge: per protocol Transfusion: Given: no  BONE/MINERAL MEDICATIONS: Hectorol/Calcitriol  change: per protocol Sensipar/Parsabiv change: per protocol  ACCESS INTERVENTION/CHANGE: no Details:   RECENT LABS: Recent Labs  Lab 04/25/24 0500 04/25/24 0712 04/27/24 0416 04/27/24 0800  HGB  --    < >  --  7.1*  NA 129*  127*   < > 129*  --   K 4.6  4.5   < > 4.4  --   CALCIUM  7.1*  6.9*   < > 7.1*  --   PHOS 3.5  --  2.9  --   ALBUMIN  <1.5*  --   --   --    < > = values in this interval not displayed.    IV ANTIBIOTICS: Yes Details: IV vanc 750 post treatment. Last day of antibiotics 06/16/24  OTHER ANTICOAGULATION: On Coumadin?: no Last INR: Managed By:  OTHER/APPTS/LAB ORDERS:   D/C Meds to be reconciled by nurse after every discharge.  Completed Ab:Ejumprx Brigette Hopfer, NP   Reviewed by: MD:______ RN_______

## 2024-04-30 ENCOUNTER — Ambulatory Visit: Admitting: Vascular Surgery

## 2024-04-30 ENCOUNTER — Inpatient Hospital Stay (HOSPITAL_COMMUNITY): Admission: RE | Admit: 2024-04-30 | Source: Ambulatory Visit

## 2024-04-30 ENCOUNTER — Encounter (HOSPITAL_COMMUNITY)

## 2024-05-05 ENCOUNTER — Encounter: Admitting: Family

## 2024-05-06 ENCOUNTER — Emergency Department (HOSPITAL_COMMUNITY)
Admission: EM | Admit: 2024-05-06 | Discharge: 2024-05-06 | Disposition: A | Discharge status: Home / Self Care | Attending: Student | Admitting: Student

## 2024-05-06 ENCOUNTER — Other Ambulatory Visit: Payer: Self-pay

## 2024-05-06 ENCOUNTER — Encounter (HOSPITAL_COMMUNITY): Payer: Self-pay

## 2024-05-06 DIAGNOSIS — Z8673 Personal history of transient ischemic attack (TIA), and cerebral infarction without residual deficits: Secondary | ICD-10-CM | POA: Insufficient documentation

## 2024-05-06 DIAGNOSIS — D649 Anemia, unspecified: Secondary | ICD-10-CM | POA: Insufficient documentation

## 2024-05-06 DIAGNOSIS — Z992 Dependence on renal dialysis: Secondary | ICD-10-CM | POA: Diagnosis not present

## 2024-05-06 DIAGNOSIS — N186 End stage renal disease: Secondary | ICD-10-CM | POA: Insufficient documentation

## 2024-05-06 DIAGNOSIS — I2510 Atherosclerotic heart disease of native coronary artery without angina pectoris: Secondary | ICD-10-CM | POA: Diagnosis not present

## 2024-05-06 DIAGNOSIS — Z7901 Long term (current) use of anticoagulants: Secondary | ICD-10-CM | POA: Diagnosis not present

## 2024-05-06 DIAGNOSIS — I120 Hypertensive chronic kidney disease with stage 5 chronic kidney disease or end stage renal disease: Secondary | ICD-10-CM | POA: Insufficient documentation

## 2024-05-06 DIAGNOSIS — D72829 Elevated white blood cell count, unspecified: Secondary | ICD-10-CM | POA: Insufficient documentation

## 2024-05-06 DIAGNOSIS — Z91010 Allergy to peanuts: Secondary | ICD-10-CM | POA: Diagnosis not present

## 2024-05-06 DIAGNOSIS — E1122 Type 2 diabetes mellitus with diabetic chronic kidney disease: Secondary | ICD-10-CM | POA: Diagnosis not present

## 2024-05-06 DIAGNOSIS — Z794 Long term (current) use of insulin: Secondary | ICD-10-CM | POA: Insufficient documentation

## 2024-05-06 LAB — COMPREHENSIVE METABOLIC PANEL WITH GFR
ALT: 6 U/L (ref 0–44)
AST: 15 U/L (ref 15–41)
Albumin: 1.9 g/dL — ABNORMAL LOW (ref 3.5–5.0)
Alkaline Phosphatase: 71 U/L (ref 38–126)
Anion gap: 5 (ref 5–15)
BUN: <5 mg/dL — ABNORMAL LOW (ref 8–23)
CO2: 27 mmol/L (ref 22–32)
Calcium: 7.6 mg/dL — ABNORMAL LOW (ref 8.9–10.3)
Chloride: 103 mmol/L (ref 98–111)
Creatinine, Ser: 1.97 mg/dL — ABNORMAL HIGH (ref 0.44–1.00)
GFR, Estimated: 27 mL/min — ABNORMAL LOW (ref >60)
Glucose, Bld: 100 mg/dL — ABNORMAL HIGH (ref 70–99)
Potassium: 3.6 mmol/L (ref 3.5–5.1)
Sodium: 135 mmol/L (ref 135–145)
Total Bilirubin: 0.8 mg/dL (ref 0.0–1.2)
Total Protein: 5.7 g/dL — ABNORMAL LOW (ref 6.5–8.1)

## 2024-05-06 LAB — CBC WITH DIFFERENTIAL/PLATELET
Abs Granulocyte: 11.1 K/uL — ABNORMAL HIGH (ref 1.5–6.5)
Abs Immature Granulocytes: 0.19 K/uL — ABNORMAL HIGH (ref 0.00–0.07)
Basophils Absolute: 0.1 K/uL (ref 0.0–0.1)
Basophils Relative: 1 %
Eosinophils Absolute: 0 K/uL (ref 0.0–0.5)
Eosinophils Relative: 0 %
HCT: 23.5 % — ABNORMAL LOW (ref 36.0–46.0)
Hemoglobin: 6.9 g/dL — CL (ref 12.0–15.0)
Immature Granulocytes: 1 %
Lymphocytes Relative: 7 %
Lymphs Abs: 0.9 K/uL (ref 0.7–4.0)
MCH: 26.1 pg (ref 26.0–34.0)
MCHC: 29.4 g/dL — ABNORMAL LOW (ref 30.0–36.0)
MCV: 89 fL (ref 80.0–100.0)
Monocytes Absolute: 1.2 K/uL — ABNORMAL HIGH (ref 0.1–1.0)
Monocytes Relative: 9 %
Neutro Abs: 11.1 K/uL — ABNORMAL HIGH (ref 1.7–7.7)
Neutrophils Relative %: 82 %
Platelets: 205 K/uL (ref 150–400)
RBC: 2.64 MIL/uL — ABNORMAL LOW (ref 3.87–5.11)
RDW: 21.2 % — ABNORMAL HIGH (ref 11.5–15.5)
Smear Review: NORMAL
WBC: 13.5 K/uL — ABNORMAL HIGH (ref 4.0–10.5)
nRBC: 0.2 % (ref 0.0–0.2)

## 2024-05-06 LAB — PREPARE RBC (CROSSMATCH)

## 2024-05-06 LAB — CBG MONITORING, ED: Glucose-Capillary: 78 mg/dL (ref 70–99)

## 2024-05-06 MED ORDER — SODIUM CHLORIDE 0.9% IV SOLUTION: Freq: Once | INTRAVENOUS | Status: AC

## 2024-05-06 NOTE — Discharge Instructions (Addendum)
 Please ask your nephrologist about an EPO shot

## 2024-05-06 NOTE — ED Triage Notes (Signed)
 PT BIB [facility] after completing dialysis and facility noting hemoglobin of 6.0 and PT endorsing dizziness. PT Aox4, denying pain or SOB.   BP 142/70, Spo2 %100 on RA, HR 74, CBG 77

## 2024-05-06 NOTE — ED Provider Notes (Signed)
 [place] EMERGENCY DEPARTMENT AT [facility] Provider Note  CSN: [id number] Arrival date & time: 05/06/24 1709  Chief Complaint(s) Check on Blood work low hemogbloin  HPI [name] is a 68 y.o. female with PMH anemia, ESRD on hemodialysis, recent hospitalization on 04/19/2024 for MRSA bacteremia with necrotizing fasciitis and ultimate BKA who presents emergency ferment for evaluation of abnormal labs.  Patient went to dialysis today and her hemoglobin was found to be 6.0 so [name] was transferred to the emergency department for further evaluation.  Since her surgery, her baseline hemoglobin has been 7.1.  [name] denies any dark stools or bright red blood per rectum.  Denies receiving EPO shots in the outpatient setting.  Endorses fatigue but denies abdominal pain, nausea, vomiting, headache, fever, chest pain, shortness of breath or other systemic symptoms.   Past Medical History Past Medical History:  Diagnosis Date   Anemia    Arthritis    Atrial fibrillation (HCC)    CAD (coronary artery disease)    Chronic kidney disease, stage III (moderate) (HCC)    Constipation    Diabetes mellitus (HCC)    History of blood transfusion    HLD (hyperlipidemia)    Hypertension    Myocardial infarction [facility])    in April 2014   Patient Active Problem List   Diagnosis Date Noted   Osteomyelitis (HCC) 04/20/2024   MRSA bacteremia 04/20/2024   Bacteremia 04/20/2024   Necrotizing fasciitis (HCC) 04/20/2024   ESRD (end stage renal disease) (HCC) 04/20/2024   Septic shock (HCC) 04/19/2024   Irritant contact dermatitis associated with fecal stoma 12/27/2023   Colostomy complication, unspecified (HCC) 12/27/2023   Diverticulitis of large intestine with complication 10/27/2023   Chronic kidney disease, stage 5 (HCC) 10/27/2023   Chronic nonintractable headache 03/03/2021   Foot callus 02/27/2021   Counseled about COVID-19 virus infection 02/24/2020   Aortic heart murmur 05/15/2019    Thyroid  enlargement 07/19/2014   Vitamin D  deficiency 09/03/2013   Hyperlipidemia 04/24/2013   Malunion of fracture 03/17/2013   Wound infection after surgery 03/17/2013   Diabetic retinopathy (HCC) 01/23/2013   Chronic atrial fibrillation (HCC) 01/15/2013   CAD (coronary artery disease) 01/11/2013   H/O non-ST elevation myocardial infarction (NSTEMI) 01/09/2013   History of stroke 01/08/2013   Diabetic neuropathy 01/08/2013   Hypertension    Diabetes mellitus    Chronic kidney disease (CKD), stage IV (severe) (HCC)    Home Medication(s) Prior to Admission medications   Medication Sig Start Date End Date Taking? Authorizing Provider  ACCU-CHEK AVIVA PLUS test strip CHECK BLOOD GLUCOSE THREE TIMES DAILY 11/05/22   Joshua Domino, DO  albuterol  (PROVENTIL ) (2.5 MG/3ML) 0.083% nebulizer solution Take 3 mLs (2.5 mg total) by nebulization every 2 (two) hours as needed for wheezing. 11/25/23   Ghimire, Donalda HERO, MD  apixaban  (ELIQUIS ) 5 MG TABS tablet Take 2 tablets (10 mg total) by mouth 2 (two) times daily for 3 days, THEN 1 tablet (5 mg total) 2 (two) times daily. 04/28/24 07/30/24  Odell Celinda Balo, MD  Blood Glucose Monitoring Suppl (ACCU-CHEK AVIVA PLUS) w/Device KIT CHECK BLOOD GLUCOSE THREE TIMES DAILY 10/26/20   Autry-Lott, Rojean, DO  brimonidine  (ALPHAGAN ) 0.2 % ophthalmic solution Place 1 drop into both eyes 2 (two) times daily.    [provider]  calcitRIOL  (ROCALTROL ) 0.25 MCG capsule TAKE 1 CAPSULE (0.25 MCG TOTAL) EVERY MONDAY, WEDNESDAY, AND FRIDAY Patient taking differently: Take 0.25 mcg by mouth in the morning and at bedtime. 06/22/22  Christia Budds, MD  carvedilol  (COREG ) 12.5 MG tablet TAKE 1 TABLET (12.5 MG TOTAL) BY MOUTH 2 (TWO) TIMES DAILY WITH A MEAL. 12/22/21   Autry-Lott, Rojean, DO  collagenase  (SANTYL ) 250 UNIT/GM ointment Apply 1 Application topically daily. Apply nickel thick amount to ulcer Right lateral ankle 3 x 2 cm with muscle/fascia capsule  exposed Patient not taking: Reported on 04/19/2024 04/15/24 05/15/24  Lamount Ethan CROME, DPM  DROPLET INSULIN  SYRINGE 31G X 5/16 0.5 ML MISC USE FOUR TIMES DAILY FOR INJECTIONS 09/03/22   Joshua Domino, DO  insulin  aspart (NOVOLOG  FLEXPEN) 100 UNIT/ML FlexPen 0-9 Units, Subcutaneous, 3 times daily with meals CBG < 70: Implement Hypoglycemia measures CBG 70 - 120: 0 units CBG 121 - 150: 1 unit CBG 151 - 200: 2 units CBG 201 - 250: 3 units CBG 251 - 300: 5 units CBG 301 - 350: 7 units CBG 351 - 400: 9 units CBG > 400: call MD Patient not taking: Reported on 04/19/2024 11/25/23   Raenelle Donalda HERO, MD  insulin  glargine (LANTUS ) 100 UNIT/ML injection Inject 0.07 mLs (7 Units total) into the skin daily. Please schedule appointment before next refill. Patient taking differently: Inject 10 Units into the skin daily. Please schedule appointment before next refill. 02/01/24   [name], MD  Insulin  Syringes, Disposable, U-100 0.5 ML MISC 4x daily injections 03/21/18   Riccio, Angela C, DO  Lancet Devices (ACCU-CHEK [facility]) lancets Fill for 1 month for TID testing. Use as instructed 11/13/13   Curtis Hadassah DASEN, MD  latanoprost  (XALATAN ) 0.005 % ophthalmic solution Place 1 drop into both eyes at bedtime.    [provider]  melatonin 5 MG TABS Take 10 mg by mouth at bedtime as needed (for insomnia).    [provider]  midodrine  (PROAMATINE ) 2.5 MG tablet Take 2.5 mg by mouth.    [provider]  Multiple Vitamin (MULTIVITAMIN WITH MINERALS) TABS Take 1 tablet by mouth daily.    [provider]  NEEDLE, DISP, 30 G (BD DISP NEEDLES) 30G X 1/2 MISC For 4x daily injections 11/05/15   Phelps, Jazma Y, DO  NOVOLIN R 100 UNIT/ML injection Inject 0-9 Units into the skin in the morning, at noon, and at bedtime. Per sliding scale 04/03/24   [provider]  oxyCODONE  (OXY IR/ROXICODONE ) 5 MG immediate release tablet Take 1 tablet (5 mg total) by mouth every 6 (six) hours as  needed for moderate pain (pain score 4-6). 04/27/24   Odell Celinda Balo, MD  vancomycin  IVPB Inject 750 mg into the vein every Monday, Wednesday, and Friday with hemodialysis. Indication:  MRSA Bacteremia Last Day of Therapy:  06/16/24 Labs - Once weekly: Vancomycin  trough 04/24/24 06/16/24  Vu, Constance DASEN, MD  Past Surgical History Past Surgical History:  Procedure Laterality Date   AMPUTATION Right 04/20/2024   Procedure: AMPUTATION BELOW KNEE;  Surgeon: Harden Jerona GAILS, MD;  Location: [facility] OR;  Service: Orthopedics;  Laterality: Right;   ANKLE SURGERY Right    x 6 total   BREAST BIOPSY Left    15 or 20 yearsa ago [name] cant remember laterality    HARDWARE REMOVAL Right 02/13/2013   Procedure: HARDWARE REMOVAL;  Surgeon: Jerona GAILS Harden, MD;  Location: [facility] OR;  Service: Orthopedics;  Laterality: Right;  Right Ankle Removal Hardware, Debridement, Place Wound VAC   IR FLUORO GUIDE CV LINE LEFT  11/08/2023   IR FLUORO GUIDE CV LINE LEFT  04/23/2024   IR FLUORO GUIDE CV LINE RIGHT  11/01/2023   IR FLUORO GUIDE CV LINE RIGHT  11/12/2023   IR REMOVAL TUN CV CATH W/O FL  04/21/2024   IR US  GUIDE VASC ACCESS LEFT  11/08/2023   IR US  GUIDE VASC ACCESS LEFT  04/23/2024   IR US  GUIDE VASC ACCESS RIGHT  11/01/2023   IR US  GUIDE VASC ACCESS RIGHT  11/12/2023   laser surgery for diabetic retinopathy Bilateral    LEFT HEART CATHETERIZATION WITH CORONARY ANGIOGRAM N/A 01/09/2013   Procedure: LEFT HEART CATHETERIZATION WITH CORONARY ANGIOGRAM;  Surgeon: Toribio JONELLE Fuel, MD;  Location: [facility] CATH LAB;  Service: Cardiovascular;  Laterality: N/A;   ORIF ANKLE FRACTURE Right 01/12/2013   Procedure: OPEN REDUCTION INTERNAL FIXATION (ORIF) ANKLE FRACTURE;  Surgeon: Jerona GAILS Harden, MD;  Location: [facility] OR;  Service: Orthopedics;  Laterality: Right;   PARTIAL COLECTOMY N/A 11/02/2023   Procedure: OPEN PARTIAL  COLECTOMY WITH END COLOSTOMY;  Surgeon: Dasie Leonor CROME, MD;  Location[facility] OR;  Service: General;  Laterality: N/A;   PARTIAL HYSTERECTOMY     TUNNELLED CATHETER EXCHANGE N/A 04/15/2024   Procedure: TUNNELLED CATHETER EXCHANGE;  Surgeon: Pearline Norman RAMAN, MD;  Location: [facility] LAB;  Service: Cardiovascular;  Laterality: N/A;   Family History Family History  Problem Relation Age of Onset   Diabetes Mother        No history CAD   Colon cancer Mother 53       died at 66 from CRC   Hypertension Father        Also had CAD   Heart disease Father    Cervical cancer Sister    Diabetes Sister    Congestive Heart Failure Sister    Heart disease Maternal Grandmother    Diabetes Brother    Kidney disease Brother    Sickle cell anemia Son    Thyroid  disease Son    Breast cancer Neg Hx    BRCA 1/2 Neg Hx     Social History Social History   Tobacco Use   Smoking status: Never   Smokeless tobacco: Never  Vaping Use   Vaping status: Never Used  Substance Use Topics   Alcohol  use: No   Drug use: No   Allergies Nsaids, Lisinopril, and Peanut-containing drug products  Review of Systems Review of Systems  Constitutional:  Positive for fatigue.    Physical Exam Vital Signs  I have reviewed the triage vital signs BP (!) 117/51   Pulse 78   Temp 98 F (36.7 C) (Oral)   Resp 14   Ht 5' 7 (1.702 m)   Wt 68.3 kg   SpO2 100%   BMI 23.58 kg/m   Physical Exam Vitals and nursing note reviewed.  Constitutional:  General: [name] is not in acute distress.    Appearance: [name] is well-developed.  HENT:     Head: Normocephalic and atraumatic.  Eyes:     Conjunctiva/sclera: Conjunctivae normal.  Cardiovascular:     Rate and Rhythm: Normal rate and regular rhythm.     Heart sounds: No murmur heard. Pulmonary:     Effort: Pulmonary effort is normal. No respiratory distress.     Breath sounds: Normal breath sounds.  Abdominal:     Palpations: Abdomen is soft.     Tenderness:  There is no abdominal tenderness.  Musculoskeletal:        General: No swelling.     Cervical back: Neck supple.  Skin:    General: Skin is warm and dry.     Capillary Refill: Capillary refill takes less than 2 seconds.     Coloration: Skin is pale.  Neurological:     Mental Status: [name] is alert.  Psychiatric:        Mood and Affect: Mood normal.     ED Results and Treatments Labs (all labs ordered are listed, but only abnormal results are displayed) Labs Reviewed  COMPREHENSIVE METABOLIC PANEL WITH GFR - Abnormal; Notable for the following components:      Result Value   Glucose, Bld 100 (*)    BUN <5 (*)    Creatinine, Ser 1.97 (*)    Calcium  7.6 (*)    Total Protein 5.7 (*)    Albumin  1.9 (*)    GFR, Estimated 27 (*)    All other components within normal limits  CBC WITH DIFFERENTIAL/PLATELET - Abnormal; Notable for the following components:   WBC 13.5 (*)    RBC 2.64 (*)    Hemoglobin 6.9 (*)    HCT 23.5 (*)    MCHC 29.4 (*)    RDW 21.2 (*)    Neutro Abs 11.1 (*)    Monocytes Absolute 1.2 (*)    Abs Immature Granulocytes 0.19 (*)    Abs Granulocyte 11.1 (*)    All other components within normal limits  CBG MONITORING, ED  POC OCCULT BLOOD, ED  TYPE AND SCREEN  PREPARE RBC (CROSSMATCH)                                                                                                                          Radiology No results found.  Pertinent labs & imaging results that were available during my care of the patient were reviewed by me and considered in my medical decision making (see MDM for details).  Medications Ordered in ED Medications  0.9 %  sodium chloride  infusion (Manually program via Guardrails IV Fluids) ( Intravenous New Bag/Given 05/06/24 2049)  Procedures .Critical Care  Performed by: Albertina Dixon,  MD Authorized by: Albertina Dixon, MD   Critical care provider statement:    Critical care time (minutes):  30   Critical care was time spent personally by me on the following activities:  Development of treatment plan with patient or surrogate, discussions with consultants, evaluation of patient's response to treatment, examination of patient, ordering and review of laboratory studies, ordering and review of radiographic studies, ordering and performing treatments and interventions, pulse oximetry, re-evaluation of patient's condition and review of old charts   (including critical care time)  Medical Decision Making / ED Course   This patient presents to the ED for concern of abnormal labs, this involves an extensive number of treatment options, and is a complaint that carries with it a high risk of complications and morbidity.  The differential diagnosis includes symptomatic anemia, acute blood loss anemia, anemia of chronic disease  MDM: Patient seen emerged part for evaluation of abnormal labs.  Physical exam reveals some mild skin pallor but is otherwise unremarkable.  Right BKA site is healing appropriately without significant bleeding.  Laboratory evaluation with leukocytosis to 13.5, hemoglobin 6.9 with an MCV of 89.0.  Creatinine 1.97 with an albumin  of 1.9.  Suspect this is likely anemia of chronic disease as it is almost identical to her baseline since hospital discharge.  Suspect acute blood loss anemia from her recent surgery with superimposed anemia of chronic disease given her kidney disease and as [name] is not receiving EPO in the outpatient setting [name] was unable to replete her blood loss from surgery.  With no dark stools or bright red blood per rectum Hemoccult deferred.  [name] received 1 unit packed red blood cells for symptomatic management and I spoke with the nephrologist on-call Dr. Frankie about how to have the patient obtain EPO in the outpatient setting.  He states that  nephrology can help arrange this.  I did place instructions for the patient to ask about this in her discharge summary.  On reevaluation patient feeling symptomatically improved.  At this time [name] does not meet inpatient criteria for admission will be discharged with outpatient follow-up.  Return precautions given which [name] voiced understanding.   Additional history obtained:  -External records from outside source obtained and reviewed including: Chart review including previous notes, labs, imaging, consultation notes   Lab Tests: -I ordered, reviewed, and interpreted labs.   The pertinent results include:   Labs Reviewed  COMPREHENSIVE METABOLIC PANEL WITH GFR - Abnormal; Notable for the following components:      Result Value   Glucose, Bld 100 (*)    BUN <5 (*)    Creatinine, Ser 1.97 (*)    Calcium  7.6 (*)    Total Protein 5.7 (*)    Albumin  1.9 (*)    GFR, Estimated 27 (*)    All other components within normal limits  CBC WITH DIFFERENTIAL/PLATELET - Abnormal; Notable for the following components:   WBC 13.5 (*)    RBC 2.64 (*)    Hemoglobin 6.9 (*)    HCT 23.5 (*)    MCHC 29.4 (*)    RDW 21.2 (*)    Neutro Abs 11.1 (*)    Monocytes Absolute 1.2 (*)    Abs Immature Granulocytes 0.19 (*)    Abs Granulocyte 11.1 (*)    All other components within normal limits  CBG MONITORING, ED  POC OCCULT BLOOD, ED  TYPE AND SCREEN  PREPARE RBC (CROSSMATCH)  Medicines ordered and prescription drug management: Meds ordered this encounter  Medications   0.9 %  sodium chloride  infusion (Manually program via Guardrails IV Fluids)    -I have reviewed the patients home medicines and have made adjustments as needed  Critical interventions Packed red blood cell administration  Consultations Obtained: I requested consultation with the nephrologist on-call,  and discussed lab and imaging findings as well as pertinent plan - they recommend: Outpatient EPO   Cardiac Monitoring: The  patient was maintained on a cardiac monitor.  I personally viewed and interpreted the cardiac monitored which showed an underlying rhythm of: NSR  Social Determinants of Health:  Factors impacting patients care include: none   Reevaluation: After the interventions noted above, I reevaluated the patient and found that they have :improved  Co morbidities that complicate the patient evaluation  Past Medical History:  Diagnosis Date   Anemia    Arthritis    Atrial fibrillation (HCC)    CAD (coronary artery disease)    Chronic kidney disease, stage III (moderate) (HCC)    Constipation    Diabetes mellitus (HCC)    History of blood transfusion    HLD (hyperlipidemia)    Hypertension    Myocardial infarction [facility])    in April 2014      Dispostion: I considered admission for this patient, but at this time [name] does not meet inpatient criteria for admission will be discharged with outpatient follow-up     Final Clinical Impression(s) / ED Diagnoses Final diagnoses:  Symptomatic anemia     @PCDICTATION @    Alantis Bethune, Lum, MD 05/07/24 0122

## 2024-05-07 ENCOUNTER — Encounter (HOSPITAL_BASED_OUTPATIENT_CLINIC_OR_DEPARTMENT_OTHER): Admitting: General Surgery

## 2024-05-07 LAB — TYPE AND SCREEN
ABO/RH(D): A POS
Antibody Screen: NEGATIVE
Unit division: 0

## 2024-05-07 LAB — BPAM RBC
Blood Product Expiration Date: 202509142359
ISSUE DATE / TIME: 202508132023
Unit Type and Rh: 6200

## 2024-05-14 ENCOUNTER — Ambulatory Visit: Admitting: Podiatry

## 2024-05-15 ENCOUNTER — Other Ambulatory Visit: Payer: Self-pay | Admitting: Vascular Surgery

## 2024-05-15 DIAGNOSIS — N184 Chronic kidney disease, stage 4 (severe): Secondary | ICD-10-CM

## 2024-05-18 ENCOUNTER — Ambulatory Visit: Payer: Self-pay | Admitting: Infectious Diseases

## 2024-05-18 ENCOUNTER — Ambulatory Visit (HOSPITAL_COMMUNITY): Admitting: Nurse Practitioner

## 2024-05-19 ENCOUNTER — Encounter: Payer: Self-pay | Admitting: Family

## 2024-05-19 ENCOUNTER — Ambulatory Visit (INDEPENDENT_AMBULATORY_CARE_PROVIDER_SITE_OTHER): Admitting: Family

## 2024-05-19 DIAGNOSIS — M726 Necrotizing fasciitis: Secondary | ICD-10-CM

## 2024-05-19 NOTE — Progress Notes (Signed)
 Post-Op Visit Note   Patient: [name]           Date of Birth: 1956-07-21           MRN: [id number] Visit Date: 05/19/2024 PCP: Campbell Reynolds, NP  Chief Complaint:  Chief Complaint  Patient presents with   Right Leg - Routine Post Op    04/20/2024 right BKA     HPI:  HPI The patient is a 68 year old woman who is seen status post right below-knee amputation.  She is currently residing at [facility]  She has been getting antibiotics while at dialysis  Continues to have moderate pink drainage from the center of her incision staples are in place  Patient is a new right transtibial  amputee.  Patient's current comorbidities are not expected to impact the ability to function with the prescribed prosthesis. Patient verbally communicates a strong desire to use a prosthesis. Patient currently requires mobility aids to ambulate without a prosthesis.  Expects not to use mobility aids with a new prosthesis.  Patient is a K2 level ambulator that will use a prosthesis to walk around their home and the community over low level environmental barriers.      Ortho Exam Incision well-approximated with staples healing well centrally there is an area that is not quite healed there is thin fibrinous tissue  Visit Diagnoses: No diagnosis found.  Plan: Harvested majority of staples left to in place.  She will continue with daily Dial soap cleansing dry dressing shrinker around-the-clock follow-up in 2 weeks for remaining staple removal  Follow-Up Instructions: No follow-ups on file.   Imaging: No results found.  Orders:  No orders of the defined types were placed in this encounter.  No orders of the defined types were placed in this encounter.    PMFS History: Patient Active Problem List   Diagnosis Date Noted   Osteomyelitis (HCC) 04/20/2024   MRSA bacteremia 04/20/2024   Bacteremia 04/20/2024   Necrotizing fasciitis (HCC) 04/20/2024   ESRD (end stage renal disease)  (HCC) 04/20/2024   Septic shock (HCC) 04/19/2024   Irritant contact dermatitis associated with fecal stoma 12/27/2023   Colostomy complication, unspecified (HCC) 12/27/2023   Diverticulitis of large intestine with complication 10/27/2023   Chronic kidney disease, stage 5 (HCC) 10/27/2023   Chronic nonintractable headache 03/03/2021   Foot callus 02/27/2021   Counseled about COVID-19 virus infection 02/24/2020   Aortic heart murmur 05/15/2019   Thyroid  enlargement 07/19/2014   Vitamin D  deficiency 09/03/2013   Hyperlipidemia 04/24/2013   Malunion of fracture 03/17/2013   Wound infection after surgery 03/17/2013   Diabetic retinopathy (HCC) 01/23/2013   Chronic atrial fibrillation (HCC) 01/15/2013   CAD (coronary artery disease) 01/11/2013   H/O non-ST elevation myocardial infarction (NSTEMI) 01/09/2013   History of stroke 01/08/2013   Diabetic neuropathy 01/08/2013   Hypertension    Diabetes mellitus    Chronic kidney disease (CKD), stage IV (severe) (HCC)    Past Medical History:  Diagnosis Date   Anemia    Arthritis    Atrial fibrillation (HCC)    CAD (coronary artery disease)    Chronic kidney disease, stage III (moderate) (HCC)    Constipation    Diabetes mellitus (HCC)    History of blood transfusion    HLD (hyperlipidemia)    Hypertension    Myocardial infarction [facility])    in April 2014    Family History  Problem Relation Age of Onset   Diabetes Mother  No history CAD   Colon cancer Mother 76       died at 80 from CRC   Hypertension Father        Also had CAD   Heart disease Father    Cervical cancer Sister    Diabetes Sister    Congestive Heart Failure Sister    Heart disease Maternal Grandmother    Diabetes Brother    Kidney disease Brother    Sickle cell anemia Son    Thyroid  disease Son    Breast cancer Neg Hx    BRCA 1/2 Neg Hx     Past Surgical History:  Procedure Laterality Date   AMPUTATION Right 04/20/2024   Procedure: AMPUTATION BELOW  KNEE;  Surgeon: Harden Jerona GAILS, MD;  Location: [facility] OR;  Service: Orthopedics;  Laterality: Right;   ANKLE SURGERY Right    x 6 total   BREAST BIOPSY Left    15 or 20 yearsa ago she cant remember laterality    HARDWARE REMOVAL Right 02/13/2013   Procedure: HARDWARE REMOVAL;  Surgeon: Jerona GAILS Harden, MD;  Location: [facility] OR;  Service: Orthopedics;  Laterality: Right;  Right Ankle Removal Hardware, Debridement, Place Wound VAC   IR FLUORO GUIDE CV LINE LEFT  11/08/2023   IR FLUORO GUIDE CV LINE LEFT  04/23/2024   IR FLUORO GUIDE CV LINE RIGHT  11/01/2023   IR FLUORO GUIDE CV LINE RIGHT  11/12/2023   IR REMOVAL TUN CV CATH W/O FL  04/21/2024   IR US  GUIDE VASC ACCESS LEFT  11/08/2023   IR US  GUIDE VASC ACCESS LEFT  04/23/2024   IR US  GUIDE VASC ACCESS RIGHT  11/01/2023   IR US  GUIDE VASC ACCESS RIGHT  11/12/2023   laser surgery for diabetic retinopathy Bilateral    LEFT HEART CATHETERIZATION WITH CORONARY ANGIOGRAM N/A 01/09/2013   Procedure: LEFT HEART CATHETERIZATION WITH CORONARY ANGIOGRAM;  Surgeon: Toribio JONELLE Fuel, MD;  Location: [facility] CATH LAB;  Service: Cardiovascular;  Laterality: N/A;   ORIF ANKLE FRACTURE Right 01/12/2013   Procedure: OPEN REDUCTION INTERNAL FIXATION (ORIF) ANKLE FRACTURE;  Surgeon: Jerona GAILS Harden, MD;  Location: [facility] OR;  Service: Orthopedics;  Laterality: Right;   PARTIAL COLECTOMY N/A 11/02/2023   Procedure: OPEN PARTIAL COLECTOMY WITH END COLOSTOMY;  Surgeon: Dasie Leonor CROME, MD;  Location: [facility] OR;  Service: General;  Laterality: N/A;   PARTIAL HYSTERECTOMY     TUNNELLED CATHETER EXCHANGE N/A 04/15/2024   Procedure: TUNNELLED CATHETER EXCHANGE;  Surgeon: Pearline Norman RAMAN, MD;  Location: [facility] LAB;  Service: Cardiovascular;  Laterality: N/A;   Social History   Occupational History   Occupation: Disabled   Occupation: disabled  Tobacco Use   Smoking status: Never   Smokeless tobacco: Never  Vaping Use   Vaping status: Never Used  Substance and Sexual Activity   Alcohol  use:  No   Drug use: No   Sexual activity: Not Currently

## 2024-05-29 ENCOUNTER — Emergency Department (HOSPITAL_COMMUNITY)

## 2024-05-29 ENCOUNTER — Telehealth: Payer: Self-pay

## 2024-05-29 ENCOUNTER — Encounter (HOSPITAL_COMMUNITY): Payer: Self-pay

## 2024-05-29 ENCOUNTER — Inpatient Hospital Stay (HOSPITAL_COMMUNITY)
Admission: EM | Admit: 2024-05-29 | Discharge: 2024-06-09 | DRG: 622 | Disposition: A | Discharge status: Skilled Nursing Facility | Source: Ambulatory Visit | Attending: Infectious Diseases | Admitting: Infectious Diseases

## 2024-05-29 ENCOUNTER — Other Ambulatory Visit: Payer: Self-pay

## 2024-05-29 DIAGNOSIS — F0390 Unspecified dementia without behavioral disturbance: Secondary | ICD-10-CM | POA: Diagnosis present

## 2024-05-29 DIAGNOSIS — D631 Anemia in chronic kidney disease: Secondary | ICD-10-CM | POA: Diagnosis present

## 2024-05-29 DIAGNOSIS — Z8619 Personal history of other infectious and parasitic diseases: Secondary | ICD-10-CM | POA: Diagnosis not present

## 2024-05-29 DIAGNOSIS — I82C11 Acute embolism and thrombosis of right internal jugular vein: Secondary | ICD-10-CM | POA: Diagnosis present

## 2024-05-29 DIAGNOSIS — M4628 Osteomyelitis of vertebra, sacral and sacrococcygeal region: Secondary | ICD-10-CM | POA: Diagnosis present

## 2024-05-29 DIAGNOSIS — E8809 Other disorders of plasma-protein metabolism, not elsewhere classified: Secondary | ICD-10-CM | POA: Diagnosis present

## 2024-05-29 DIAGNOSIS — E11319 Type 2 diabetes mellitus with unspecified diabetic retinopathy without macular edema: Secondary | ICD-10-CM | POA: Diagnosis present

## 2024-05-29 DIAGNOSIS — R7881 Bacteremia: Secondary | ICD-10-CM | POA: Diagnosis present

## 2024-05-29 DIAGNOSIS — L89154 Pressure ulcer of sacral region, stage 4: Secondary | ICD-10-CM | POA: Diagnosis present

## 2024-05-29 DIAGNOSIS — N2581 Secondary hyperparathyroidism of renal origin: Secondary | ICD-10-CM | POA: Diagnosis present

## 2024-05-29 DIAGNOSIS — Z888 Allergy status to other drugs, medicaments and biological substances status: Secondary | ICD-10-CM

## 2024-05-29 DIAGNOSIS — Z66 Do not resuscitate: Secondary | ICD-10-CM | POA: Diagnosis present

## 2024-05-29 DIAGNOSIS — Z91010 Allergy to peanuts: Secondary | ICD-10-CM

## 2024-05-29 DIAGNOSIS — Z886 Allergy status to analgesic agent status: Secondary | ICD-10-CM

## 2024-05-29 DIAGNOSIS — Z1621 Resistance to vancomycin: Secondary | ICD-10-CM | POA: Diagnosis not present

## 2024-05-29 DIAGNOSIS — Y835 Amputation of limb(s) as the cause of abnormal reaction of the patient, or of later complication, without mention of misadventure at the time of the procedure: Secondary | ICD-10-CM | POA: Diagnosis present

## 2024-05-29 DIAGNOSIS — Z933 Colostomy status: Secondary | ICD-10-CM | POA: Diagnosis not present

## 2024-05-29 DIAGNOSIS — Z992 Dependence on renal dialysis: Secondary | ICD-10-CM | POA: Diagnosis not present

## 2024-05-29 DIAGNOSIS — Z751 Person awaiting admission to adequate facility elsewhere: Secondary | ICD-10-CM

## 2024-05-29 DIAGNOSIS — D6859 Other primary thrombophilia: Secondary | ICD-10-CM | POA: Diagnosis present

## 2024-05-29 DIAGNOSIS — Z794 Long term (current) use of insulin: Secondary | ICD-10-CM

## 2024-05-29 DIAGNOSIS — Z86718 Personal history of other venous thrombosis and embolism: Secondary | ICD-10-CM | POA: Diagnosis not present

## 2024-05-29 DIAGNOSIS — E46 Unspecified protein-calorie malnutrition: Secondary | ICD-10-CM | POA: Diagnosis present

## 2024-05-29 DIAGNOSIS — Z8249 Family history of ischemic heart disease and other diseases of the circulatory system: Secondary | ICD-10-CM

## 2024-05-29 DIAGNOSIS — L02818 Cutaneous abscess of other sites: Secondary | ICD-10-CM | POA: Diagnosis not present

## 2024-05-29 DIAGNOSIS — E43 Unspecified severe protein-calorie malnutrition: Secondary | ICD-10-CM | POA: Diagnosis present

## 2024-05-29 DIAGNOSIS — B964 Proteus (mirabilis) (morganii) as the cause of diseases classified elsewhere: Secondary | ICD-10-CM | POA: Diagnosis not present

## 2024-05-29 DIAGNOSIS — I482 Chronic atrial fibrillation, unspecified: Secondary | ICD-10-CM | POA: Diagnosis present

## 2024-05-29 DIAGNOSIS — I120 Hypertensive chronic kidney disease with stage 5 chronic kidney disease or end stage renal disease: Secondary | ICD-10-CM | POA: Diagnosis present

## 2024-05-29 DIAGNOSIS — Z841 Family history of disorders of kidney and ureter: Secondary | ICD-10-CM

## 2024-05-29 DIAGNOSIS — E1169 Type 2 diabetes mellitus with other specified complication: Secondary | ICD-10-CM | POA: Diagnosis present

## 2024-05-29 DIAGNOSIS — T879 Unspecified complications of amputation stump: Secondary | ICD-10-CM | POA: Diagnosis present

## 2024-05-29 DIAGNOSIS — D509 Iron deficiency anemia, unspecified: Secondary | ICD-10-CM | POA: Diagnosis present

## 2024-05-29 DIAGNOSIS — Z6822 Body mass index (BMI) 22.0-22.9, adult: Secondary | ICD-10-CM

## 2024-05-29 DIAGNOSIS — L98429 Non-pressure chronic ulcer of back with unspecified severity: Secondary | ICD-10-CM | POA: Diagnosis present

## 2024-05-29 DIAGNOSIS — I700 Atherosclerosis of aorta: Secondary | ICD-10-CM | POA: Diagnosis present

## 2024-05-29 DIAGNOSIS — Z560 Unemployment, unspecified: Secondary | ICD-10-CM

## 2024-05-29 DIAGNOSIS — E1140 Type 2 diabetes mellitus with diabetic neuropathy, unspecified: Secondary | ICD-10-CM | POA: Diagnosis present

## 2024-05-29 DIAGNOSIS — Z9049 Acquired absence of other specified parts of digestive tract: Secondary | ICD-10-CM

## 2024-05-29 DIAGNOSIS — N186 End stage renal disease: Secondary | ICD-10-CM | POA: Insufficient documentation

## 2024-05-29 DIAGNOSIS — Z79899 Other long term (current) drug therapy: Secondary | ICD-10-CM

## 2024-05-29 DIAGNOSIS — Z89511 Acquired absence of right leg below knee: Secondary | ICD-10-CM | POA: Diagnosis not present

## 2024-05-29 DIAGNOSIS — D649 Anemia, unspecified: Secondary | ICD-10-CM | POA: Diagnosis present

## 2024-05-29 DIAGNOSIS — E1122 Type 2 diabetes mellitus with diabetic chronic kidney disease: Secondary | ICD-10-CM | POA: Diagnosis present

## 2024-05-29 DIAGNOSIS — Z833 Family history of diabetes mellitus: Secondary | ICD-10-CM

## 2024-05-29 DIAGNOSIS — Z9189 Other specified personal risk factors, not elsewhere classified: Secondary | ICD-10-CM

## 2024-05-29 DIAGNOSIS — T8130XA Disruption of wound, unspecified, initial encounter: Secondary | ICD-10-CM | POA: Diagnosis present

## 2024-05-29 DIAGNOSIS — B952 Enterococcus as the cause of diseases classified elsewhere: Secondary | ICD-10-CM | POA: Diagnosis not present

## 2024-05-29 DIAGNOSIS — T8781 Dehiscence of amputation stump: Secondary | ICD-10-CM | POA: Diagnosis not present

## 2024-05-29 DIAGNOSIS — E785 Hyperlipidemia, unspecified: Secondary | ICD-10-CM | POA: Diagnosis present

## 2024-05-29 DIAGNOSIS — Z23 Encounter for immunization: Secondary | ICD-10-CM

## 2024-05-29 DIAGNOSIS — Z7901 Long term (current) use of anticoagulants: Secondary | ICD-10-CM

## 2024-05-29 DIAGNOSIS — I959 Hypotension, unspecified: Secondary | ICD-10-CM | POA: Diagnosis not present

## 2024-05-29 DIAGNOSIS — A4902 Methicillin resistant Staphylococcus aureus infection, unspecified site: Secondary | ICD-10-CM | POA: Diagnosis not present

## 2024-05-29 DIAGNOSIS — I10 Essential (primary) hypertension: Secondary | ICD-10-CM | POA: Diagnosis present

## 2024-05-29 DIAGNOSIS — Z8614 Personal history of Methicillin resistant Staphylococcus aureus infection: Secondary | ICD-10-CM | POA: Diagnosis not present

## 2024-05-29 DIAGNOSIS — E1129 Type 2 diabetes mellitus with other diabetic kidney complication: Secondary | ICD-10-CM

## 2024-05-29 DIAGNOSIS — E11628 Type 2 diabetes mellitus with other skin complications: Secondary | ICD-10-CM | POA: Diagnosis not present

## 2024-05-29 DIAGNOSIS — Z792 Long term (current) use of antibiotics: Secondary | ICD-10-CM | POA: Diagnosis not present

## 2024-05-29 DIAGNOSIS — B9562 Methicillin resistant Staphylococcus aureus infection as the cause of diseases classified elsewhere: Secondary | ICD-10-CM | POA: Diagnosis not present

## 2024-05-29 DIAGNOSIS — I2510 Atherosclerotic heart disease of native coronary artery without angina pectoris: Secondary | ICD-10-CM | POA: Diagnosis present

## 2024-05-29 DIAGNOSIS — Z90711 Acquired absence of uterus with remaining cervical stump: Secondary | ICD-10-CM

## 2024-05-29 DIAGNOSIS — E11622 Type 2 diabetes mellitus with other skin ulcer: Secondary | ICD-10-CM | POA: Diagnosis not present

## 2024-05-29 DIAGNOSIS — I252 Old myocardial infarction: Secondary | ICD-10-CM

## 2024-05-29 LAB — CBC WITH DIFFERENTIAL/PLATELET
Abs Immature Granulocytes: 0.18 K/uL — ABNORMAL HIGH (ref 0.00–0.07)
Abs Immature Granulocytes: 0.15 K/uL — ABNORMAL HIGH (ref 0.00–0.07)
Basophils Absolute: 0.1 K/uL (ref 0.0–0.1)
Basophils Absolute: 0.1 K/uL (ref 0.0–0.1)
Basophils Relative: 1 %
Basophils Relative: 1 %
Eosinophils Absolute: 0 K/uL (ref 0.0–0.5)
Eosinophils Absolute: 0 K/uL (ref 0.0–0.5)
Eosinophils Relative: 0 %
Eosinophils Relative: 0 %
HCT: 29.8 % — ABNORMAL LOW (ref 36.0–46.0)
HCT: 29.8 % — ABNORMAL LOW (ref 36.0–46.0)
Hemoglobin: 8.5 g/dL — ABNORMAL LOW (ref 12.0–15.0)
Hemoglobin: 8.7 g/dL — ABNORMAL LOW (ref 12.0–15.0)
Immature Granulocytes: 2 %
Immature Granulocytes: 1 %
Lymphocytes Relative: 12 %
Lymphocytes Relative: 7 %
Lymphs Abs: 1.5 K/uL (ref 0.7–4.0)
Lymphs Abs: 0.8 K/uL (ref 0.7–4.0)
MCH: 24.9 pg — ABNORMAL LOW (ref 26.0–34.0)
MCH: 25.4 pg — ABNORMAL LOW (ref 26.0–34.0)
MCHC: 28.5 g/dL — ABNORMAL LOW (ref 30.0–36.0)
MCHC: 29.2 g/dL — ABNORMAL LOW (ref 30.0–36.0)
MCV: 87.1 fL (ref 80.0–100.0)
MCV: 86.9 fL (ref 80.0–100.0)
Monocytes Absolute: 1.4 K/uL — ABNORMAL HIGH (ref 0.1–1.0)
Monocytes Absolute: 1 K/uL (ref 0.1–1.0)
Monocytes Relative: 12 %
Monocytes Relative: 9 %
Neutro Abs: 9.1 K/uL — ABNORMAL HIGH (ref 1.7–7.7)
Neutro Abs: 8.7 K/uL — ABNORMAL HIGH (ref 1.7–7.7)
Neutrophils Relative %: 73 %
Neutrophils Relative %: 82 %
Platelets: 293 K/uL (ref 150–400)
Platelets: 287 K/uL (ref 150–400)
RBC: 3.42 MIL/uL — ABNORMAL LOW (ref 3.87–5.11)
RBC: 3.43 MIL/uL — ABNORMAL LOW (ref 3.87–5.11)
RDW: 20.7 % — ABNORMAL HIGH (ref 11.5–15.5)
RDW: 20.7 % — ABNORMAL HIGH (ref 11.5–15.5)
WBC: 12.4 K/uL — ABNORMAL HIGH (ref 4.0–10.5)
WBC: 10.7 K/uL — ABNORMAL HIGH (ref 4.0–10.5)
nRBC: 0 % (ref 0.0–0.2)
nRBC: 0 % (ref 0.0–0.2)

## 2024-05-29 LAB — BASIC METABOLIC PANEL WITH GFR
Anion gap: 12 (ref 5–15)
BUN: 13 mg/dL (ref 8–23)
CO2: 24 mmol/L (ref 22–32)
Calcium: 8.4 mg/dL — ABNORMAL LOW (ref 8.9–10.3)
Chloride: 105 mmol/L (ref 98–111)
Creatinine, Ser: 4.47 mg/dL — ABNORMAL HIGH (ref 0.44–1.00)
GFR, Estimated: 10 mL/min — ABNORMAL LOW (ref >60)
Glucose, Bld: 165 mg/dL — ABNORMAL HIGH (ref 70–99)
Potassium: 3.5 mmol/L (ref 3.5–5.1)
Sodium: 141 mmol/L (ref 135–145)

## 2024-05-29 LAB — RENAL FUNCTION PANEL
Albumin: 1.9 g/dL — ABNORMAL LOW (ref 3.5–5.0)
Anion gap: 10 (ref 5–15)
BUN: 14 mg/dL (ref 8–23)
CO2: 26 mmol/L (ref 22–32)
Calcium: 8.2 mg/dL — ABNORMAL LOW (ref 8.9–10.3)
Chloride: 103 mmol/L (ref 98–111)
Creatinine, Ser: 4.68 mg/dL — ABNORMAL HIGH (ref 0.44–1.00)
GFR, Estimated: 10 mL/min — ABNORMAL LOW (ref >60)
Glucose, Bld: 123 mg/dL — ABNORMAL HIGH (ref 70–99)
Phosphorus: 2.9 mg/dL (ref 2.5–4.6)
Potassium: 3.4 mmol/L — ABNORMAL LOW (ref 3.5–5.1)
Sodium: 139 mmol/L (ref 135–145)

## 2024-05-29 LAB — IRON AND TIBC: Iron: 14 ug/dL — ABNORMAL LOW (ref 28–170)

## 2024-05-29 LAB — C-REACTIVE PROTEIN: CRP: 4.1 mg/dL — ABNORMAL HIGH (ref <1.0)

## 2024-05-29 LAB — VANCOMYCIN, RANDOM: Vancomycin Rm: 20 ug/mL

## 2024-05-29 LAB — HEMOGLOBIN A1C
Hgb A1c MFr Bld: 5.1 % (ref 4.8–5.6)
Mean Plasma Glucose: 99.67 mg/dL

## 2024-05-29 LAB — CBG MONITORING, ED: Glucose-Capillary: 122 mg/dL — ABNORMAL HIGH (ref 70–99)

## 2024-05-29 LAB — PHOSPHORUS: Phosphorus: 2.8 mg/dL (ref 2.5–4.6)

## 2024-05-29 LAB — GLUCOSE, CAPILLARY: Glucose-Capillary: 139 mg/dL — ABNORMAL HIGH (ref 70–99)

## 2024-05-29 LAB — MAGNESIUM: Magnesium: 1.9 mg/dL (ref 1.7–2.4)

## 2024-05-29 LAB — FERRITIN: Ferritin: 720 ng/mL — ABNORMAL HIGH (ref 11–307)

## 2024-05-29 LAB — SEDIMENTATION RATE: Sed Rate: 17 mm/h (ref 0–22)

## 2024-05-29 MED ORDER — CHLORHEXIDINE GLUCONATE CLOTH 2 % EX PADS
6.0000 | MEDICATED_PAD | Freq: Every day | CUTANEOUS | Status: DC
  Administered 2024-05-30 – 2024-05-31 (×2): 6 via TOPICAL

## 2024-05-29 MED ORDER — COLLAGENASE 250 UNIT/GM EX OINT
TOPICAL_OINTMENT | Freq: Every day | CUTANEOUS | Status: DC
  Filled 2024-05-29: qty 30

## 2024-05-29 MED ORDER — MELATONIN 5 MG PO TABS
10.0000 mg | ORAL_TABLET | Freq: Every evening | ORAL | Status: DC | PRN
  Administered 2024-05-29 – 2024-06-07 (×7): 10 mg via ORAL
  Filled 2024-05-29 (×7): qty 2

## 2024-05-29 MED ORDER — BRIMONIDINE TARTRATE 0.2 % OP SOLN
1.0000 [drp] | Freq: Two times a day (BID) | OPHTHALMIC | Status: DC
  Administered 2024-05-29 – 2024-06-09 (×21): 1 [drp] via OPHTHALMIC
  Filled 2024-05-29 (×7): qty 5

## 2024-05-29 MED ORDER — CARVEDILOL 12.5 MG PO TABS
12.5000 mg | ORAL_TABLET | Freq: Two times a day (BID) | ORAL | Status: DC
  Administered 2024-05-29 – 2024-06-01 (×5): 12.5 mg via ORAL
  Filled 2024-05-29 (×7): qty 1

## 2024-05-29 MED ORDER — VANCOMYCIN VARIABLE DOSE PER UNSTABLE RENAL FUNCTION (PHARMACIST DOSING): Status: DC

## 2024-05-29 MED ORDER — APIXABAN 5 MG PO TABS
5.0000 mg | ORAL_TABLET | Freq: Two times a day (BID) | ORAL | Status: DC
  Administered 2024-05-29 – 2024-06-09 (×23): 5 mg via ORAL
  Filled 2024-05-29 (×23): qty 1

## 2024-05-29 MED ORDER — LATANOPROST 0.005 % OP SOLN
1.0000 [drp] | Freq: Every day | OPHTHALMIC | Status: DC
  Administered 2024-05-29 – 2024-06-08 (×11): 1 [drp] via OPHTHALMIC
  Filled 2024-05-29: qty 2.5

## 2024-05-29 MED ORDER — INSULIN ASPART 100 UNIT/ML IJ SOLN: 0.0000 [IU] | Freq: Three times a day (TID) | INTRAMUSCULAR | Status: DC

## 2024-05-29 MED ORDER — LIDOCAINE-EPINEPHRINE (PF) 2 %-1:200000 IJ SOLN
10.0000 mL | Freq: Once | INTRAMUSCULAR | Status: AC
  Administered 2024-05-29: 10 mL via INTRADERMAL
  Filled 2024-05-29: qty 20

## 2024-05-29 MED ORDER — IOHEXOL 350 MG/ML SOLN
75.0000 mL | Freq: Once | INTRAVENOUS | Status: AC | PRN
Start: 2024-05-29 — End: 2024-05-29
  Administered 2024-05-29: 75 mL via INTRAVENOUS

## 2024-05-29 MED ORDER — INSULIN ASPART 100 UNIT/ML IJ SOLN: 0.0000 [IU] | Freq: Every day | INTRAMUSCULAR | Status: DC

## 2024-05-29 MED ORDER — OXYCODONE HCL 5 MG PO TABS
5.0000 mg | ORAL_TABLET | Freq: Four times a day (QID) | ORAL | Status: DC | PRN
  Administered 2024-05-29 – 2024-05-30 (×3): 5 mg via ORAL
  Filled 2024-05-29 (×3): qty 1

## 2024-05-29 NOTE — Plan of Care (Signed)
  Problem: Coping: Goal: Ability to adjust to condition or change in health will improve Outcome: Not Progressing   Problem: Skin Integrity: Goal: Risk for impaired skin integrity will decrease Outcome: Not Progressing   Problem: Tissue Perfusion: Goal: Adequacy of tissue perfusion will improve Outcome: Not Progressing   Problem: Clinical Measurements: Goal: Will remain free from infection Outcome: Not Progressing   Problem: Nutrition: Goal: Adequate nutrition will be maintained Outcome: Not Progressing   Problem: Pain Managment: Goal: General experience of comfort will improve and/or be controlled Outcome: Not Progressing

## 2024-05-29 NOTE — Progress Notes (Signed)
 Debridement of sacral decubitus ulcer:  1.  Excisional vs non-excisional. Excisional  2.  Tool used for debridement (curette, scapel, etc.)  scalpel and scissors  3.  Frequency of surgical debridement.   once  4.  Measurement of total devitalized tissue (wound surface) before and after surgical debridement.   11x5.5x2cm, before After 11x6x6cm  5.  Area and depth of devitalized tissue removed from wound.  9x6x4cm  6.  Blood loss and description of tissue removed.  No blood loss, removed all necrotic tissue as well as bone fragement  7.  Evidence of the progress of the wound's response to treatment.  A.  Current wound volume (current dimensions and depth).  11x6x6cm  B.  Presence (and extent of) of infection.  No  C.  Presence (and extent of) of non viable tissue.  Yes  D.  Other material in the wound that is expected to inhibit healing.  No   8.  Was there any viable tissue removed (measurements): No   Bone fragment send for culture to help with abx direction for medicine/ID service.  Dressing changes, santyl , and PT hydrotherapy have been ordered along with an air overlay mattress and frequent turning recommendations.    Burnard FORBES Banter, PA-C 4:40 PM 05/29/2024

## 2024-05-29 NOTE — Hospital Course (Signed)
 Principal Problem:   Sacral decubitus ulcer, stage IV (HCC) Active Problems:   Hypertension   Aortic atherosclerosis (HCC)   Chronic atrial fibrillation (HCC)   MRSA bacteremia   ESRD (end stage renal disease) (HCC)   Primary hypercoagulable state (HCC)   BKA stump complication (HCC)   Normocytic anemia   Does not feel safe at home   Colostomy in place [facility])   Diabetes mellitus with kidney complication, with long-term current use of insulin  (HCC)  Resolved Problems:   * No resolved hospital problems. *  Consults:***  Procedures:***  Follow-up items:***  68 year old female presents with dehiscence of surgical wound of R BKA stump, found to have sacral decubitus ulcer with osteomyelitis and admitted for evaluation and management.  Principal Problem:   Sacral decubitus ulcer, stage IV (HCC) Active Problems:   Hypertension   Aortic atherosclerosis (HCC)   Chronic atrial fibrillation (HCC)   MRSA bacteremia   ESRD (end stage renal disease) (HCC)   Primary hypercoagulable state (HCC)   BKA stump complication (HCC)   Normocytic anemia   Does not feel safe at home   Colostomy in place [facility])   Diabetes mellitus with kidney complication, with long-term current use of insulin  (HCC)

## 2024-05-29 NOTE — Consult Note (Addendum)
 [name] Renal Consultation Note    Indication for Consultation:  Management of ESRD/hemodialysis; anemia, hypertension/volume and secondary hyperparathyroidism  King, Damian, NP  HPI: [name] is a 68 y.o. female with ESRD on HD MWF at [facility]. She has a past medical history significant for perf diverticulitis (s/p colectomy/wound vac), dementia, sacral decubitus ulcer, necrotizing fasciitis, and osteomyelitis of the right lower extremity status post BKA on 04/20/2024.  Patient presents to the ED from home by her son due to right BKA wound dehiscence.  Reviewed previous medical records and it appears she was recently seen in clinic and staples were removed on 05/19/24.  Seen and examined patient at bedside in the ED. Denies CP, palpitations, and N/V. Surgery PA currently performing wound debridement of her sacral wound at bedside. Surgery recommended an ID consult and CT ABD/pelvis was ordered by hospitalist.  Noted patient expressed concerns about returning home due to not having all the necessary home equipment.  Patient will be admitted and we have been consulted for her ongoing dialysis management.  Her last dialysis was on 05/27/24.  Overall, she is compliant with her hemodialysis treatments.  Also noted her EDW was recently lowered on 9/3. Noted she's been getting IV Vanc 750mg  with HD in outpatient. No urgent need for hemodialysis today and will plan for dialysis tomorrow morning.  Past Medical History:  Diagnosis Date   Anemia    Arthritis    Atrial fibrillation (HCC)    CAD (coronary artery disease)    Chronic kidney disease, stage III (moderate) (HCC)    Constipation    Diabetes mellitus (HCC)    History of blood transfusion    HLD (hyperlipidemia)    Hypertension    Myocardial infarction [facility])    in April 2014   Past Surgical History:  Procedure Laterality Date   AMPUTATION Right 04/20/2024   Procedure: AMPUTATION BELOW KNEE;   Surgeon: Harden Jerona GAILS, MD;  Location: [facility] OR;  Service: Orthopedics;  Laterality: Right;   ANKLE SURGERY Right    x 6 total   BREAST BIOPSY Left    15 or 20 yearsa ago she cant remember laterality    HARDWARE REMOVAL Right 02/13/2013   Procedure: HARDWARE REMOVAL;  Surgeon: Jerona GAILS Harden, MD;  Location: [facility] OR;  Service: Orthopedics;  Laterality: Right;  Right Ankle Removal Hardware, Debridement, Place Wound VAC   IR FLUORO GUIDE CV LINE LEFT  11/08/2023   IR FLUORO GUIDE CV LINE LEFT  04/23/2024   IR FLUORO GUIDE CV LINE RIGHT  11/01/2023   IR FLUORO GUIDE CV LINE RIGHT  11/12/2023   IR REMOVAL TUN CV CATH W/O FL  04/21/2024   IR US  GUIDE VASC ACCESS LEFT  11/08/2023   IR US  GUIDE VASC ACCESS LEFT  04/23/2024   IR US  GUIDE VASC ACCESS RIGHT  11/01/2023   IR US  GUIDE VASC ACCESS RIGHT  11/12/2023   laser surgery for diabetic retinopathy Bilateral    LEFT HEART CATHETERIZATION WITH CORONARY ANGIOGRAM N/A 01/09/2013   Procedure: LEFT HEART CATHETERIZATION WITH CORONARY ANGIOGRAM;  Surgeon: Toribio JONELLE Fuel, MD;  Location: [facility] CATH LAB;  Service: Cardiovascular;  Laterality: N/A;   ORIF ANKLE FRACTURE Right 01/12/2013   Procedure: OPEN REDUCTION INTERNAL FIXATION (ORIF) ANKLE FRACTURE;  Surgeon: Jerona GAILS Harden, MD;  Location: [facility] OR;  Service: Orthopedics;  Laterality: Right;   PARTIAL COLECTOMY N/A 11/02/2023   Procedure: OPEN PARTIAL COLECTOMY WITH END COLOSTOMY;  Surgeon: Dasie Leonor LITTIE,  MD;  Location: [facility] OR;  Service: General;  Laterality: N/A;   PARTIAL HYSTERECTOMY     TUNNELLED CATHETER EXCHANGE N/A 04/15/2024   Procedure: TUNNELLED CATHETER EXCHANGE;  Surgeon: Pearline Norman RAMAN, MD;  Location: [facility];  Service: Cardiovascular;  Laterality: N/A;   Family History  Problem Relation Age of Onset   Diabetes Mother        No history CAD   Colon cancer Mother 36       died at 20 from CRC   Hypertension Father        Also had CAD   Heart disease Father    Cervical cancer Sister    Diabetes  Sister    Congestive Heart Failure Sister    Heart disease Maternal Grandmother    Diabetes Brother    Kidney disease Brother    Sickle cell anemia Son    Thyroid  disease Son    Breast cancer Neg Hx    BRCA 1/2 Neg Hx    Social History:  reports that she has never smoked. She has never used smokeless tobacco. She reports that she does not drink alcohol  and does not use drugs. Allergies  Allergen Reactions   Nsaids Other (See Comments)    CKD stage 4   Lisinopril Other (See Comments)    coughing   Peanut-Containing Drug Products Itching and Other (See Comments)    GI intolerance - diarrhea   Prior to Admission medications   Medication Sig Start Date End Date Taking? Authorizing Provider  ACCU-CHEK AVIVA PLUS test strip CHECK BLOOD GLUCOSE THREE TIMES DAILY 11/05/22   Joshua Domino, DO  albuterol  (PROVENTIL ) (2.5 MG/3ML) 0.083% nebulizer solution Take 3 mLs (2.5 mg total) by nebulization every 2 (two) hours as needed for wheezing. 11/25/23   Ghimire, Donalda HERO, MD  apixaban  (ELIQUIS ) 5 MG TABS tablet Take 2 tablets (10 mg total) by mouth 2 (two) times daily for 3 days, THEN 1 tablet (5 mg total) 2 (two) times daily. 04/28/24 07/30/24  Odell Celinda Balo, MD  Blood Glucose Monitoring Suppl (ACCU-CHEK AVIVA PLUS) w/Device KIT CHECK BLOOD GLUCOSE THREE TIMES DAILY 10/26/20   Autry-Lott, Rojean, DO  brimonidine  (ALPHAGAN ) 0.2 % ophthalmic solution Place 1 drop into both eyes 2 (two) times daily.    [provider]  calcitRIOL  (ROCALTROL ) 0.25 MCG capsule TAKE 1 CAPSULE (0.25 MCG TOTAL) EVERY MONDAY, WEDNESDAY, AND FRIDAY Patient taking differently: Take 0.25 mcg by mouth in the morning and at bedtime. 06/22/22   Christia Budds, MD  carvedilol  (COREG ) 12.5 MG tablet TAKE 1 TABLET (12.5 MG TOTAL) BY MOUTH 2 (TWO) TIMES DAILY WITH A MEAL. 12/22/21   Autry-Lott, Rojean, DO  DROPLET INSULIN  SYRINGE 31G X 5/16 0.5 ML MISC USE FOUR TIMES DAILY FOR INJECTIONS 09/03/22   Joshua Domino, DO  ELIQUIS   2.5 MG TABS tablet Take 2.5 mg by mouth 2 (two) times daily. 05/22/24   [provider]  insulin  aspart (NOVOLOG  FLEXPEN) 100 UNIT/ML FlexPen 0-9 Units, Subcutaneous, 3 times daily with meals CBG < 70: Implement Hypoglycemia measures CBG 70 - 120: 0 units CBG 121 - 150: 1 unit CBG 151 - 200: 2 units CBG 201 - 250: 3 units CBG 251 - 300: 5 units CBG 301 - 350: 7 units CBG 351 - 400: 9 units CBG > 400: call MD Patient not taking: Reported on 04/19/2024 11/25/23   Raenelle Donalda HERO, MD  insulin  glargine (LANTUS ) 100 UNIT/ML injection Inject 0.07 mLs (7 Units total) into the  skin daily. Please schedule appointment before next refill. Patient taking differently: Inject 10 Units into the skin daily. Please schedule appointment before next refill. 02/01/24   [name], MD  Insulin  Syringes, Disposable, U-100 0.5 ML MISC 4x daily injections 03/21/18   Riccio, Angela C, DO  Lancet Devices (ACCU-CHEK [facility]) lancets Fill for 1 month for TID testing. Use as instructed 11/13/13   Curtis Hadassah DASEN, MD  latanoprost  (XALATAN ) 0.005 % ophthalmic solution Place 1 drop into both eyes at bedtime.    [provider]  melatonin 5 MG TABS Take 10 mg by mouth at bedtime as needed (for insomnia).    [provider]  midodrine  (PROAMATINE ) 2.5 MG tablet Take 2.5 mg by mouth.    [provider]  Multiple Vitamin (MULTIVITAMIN WITH MINERALS) TABS Take 1 tablet by mouth daily.    [provider]  NEEDLE, DISP, 30 G (BD DISP NEEDLES) 30G X 1/2 MISC For 4x daily injections 11/05/15   Phelps, Jazma Y, DO  NOVOLIN R 100 UNIT/ML injection Inject 0-9 Units into the skin in the morning, at noon, and at bedtime. Per sliding scale 04/03/24   [provider]  oxyCODONE  (OXY IR/ROXICODONE ) 5 MG immediate release tablet Take 1 tablet (5 mg total) by mouth every 6 (six) hours as needed for moderate pain (pain score 4-6). 04/27/24   Odell Celinda Balo, MD  vancomycin  IVPB Inject  750 mg into the vein every Monday, Wednesday, and Friday with hemodialysis. Indication:  MRSA Bacteremia Last Day of Therapy:  06/16/24 Labs - Once weekly: Vancomycin  trough 04/24/24 06/16/24  Vu, Constance DASEN, MD   Current Facility-Administered Medications  Medication Dose Route Frequency Provider Last Rate Last Admin   apixaban  (ELIQUIS ) tablet 5 mg  5 mg Oral BID Paterson, Robert C, MD   5 mg at 05/29/24 1555   carvedilol  (COREG ) tablet 12.5 mg  12.5 mg Oral BID WC Paterson, Robert C, MD       collagenase  (SANTYL ) ointment   Topical Daily [name], MD       insulin  aspart (novoLOG ) injection 0-5 Units  0-5 Units Subcutaneous QHS Paterson, Robert C, MD       insulin  aspart (novoLOG ) injection 0-6 Units  0-6 Units Subcutaneous TID WC Paterson, Robert C, MD       melatonin tablet 10 mg  10 mg Oral QHS PRN Paterson, Robert C, MD       oxyCODONE  (Oxy IR/ROXICODONE ) immediate release tablet 5 mg  5 mg Oral Q6H PRN [name], MD       Current Outpatient Medications  Medication Sig Dispense Refill   ACCU-CHEK AVIVA PLUS test strip CHECK BLOOD GLUCOSE THREE TIMES DAILY 300 strip 3   albuterol  (PROVENTIL ) (2.5 MG/3ML) 0.083% nebulizer solution Take 3 mLs (2.5 mg total) by nebulization every 2 (two) hours as needed for wheezing.     apixaban  (ELIQUIS ) 5 MG TABS tablet Take 2 tablets (10 mg total) by mouth 2 (two) times daily for 3 days, THEN 1 tablet (5 mg total) 2 (two) times daily.     Blood Glucose Monitoring Suppl (ACCU-CHEK AVIVA PLUS) w/Device KIT CHECK BLOOD GLUCOSE THREE TIMES DAILY 1 kit 0   brimonidine  (ALPHAGAN ) 0.2 % ophthalmic solution Place 1 drop into both eyes 2 (two) times daily.     calcitRIOL  (ROCALTROL ) 0.25 MCG capsule TAKE 1 CAPSULE (0.25 MCG TOTAL) EVERY MONDAY, WEDNESDAY, AND FRIDAY (Patient taking differently: Take 0.25 mcg by mouth in the morning and  at bedtime.) 39 capsule 0   carvedilol  (COREG ) 12.5 MG tablet TAKE 1 TABLET (12.5 MG TOTAL) BY MOUTH 2 (TWO) TIMES  DAILY WITH A MEAL. 180 tablet 3   DROPLET INSULIN  SYRINGE 31G X 5/16 0.5 ML MISC USE FOUR TIMES DAILY FOR INJECTIONS 400 each 3   ELIQUIS  2.5 MG TABS tablet Take 2.5 mg by mouth 2 (two) times daily.     insulin  aspart (NOVOLOG  FLEXPEN) 100 UNIT/ML FlexPen 0-9 Units, Subcutaneous, 3 times daily with meals CBG < 70: Implement Hypoglycemia measures CBG 70 - 120: 0 units CBG 121 - 150: 1 unit CBG 151 - 200: 2 units CBG 201 - 250: 3 units CBG 251 - 300: 5 units CBG 301 - 350: 7 units CBG 351 - 400: 9 units CBG > 400: call MD (Patient not taking: Reported on 04/19/2024)     insulin  glargine (LANTUS ) 100 UNIT/ML injection Inject 0.07 mLs (7 Units total) into the skin daily. Please schedule appointment before next refill. (Patient taking differently: Inject 10 Units into the skin daily. Please schedule appointment before next refill.)     Insulin  Syringes, Disposable, U-100 0.5 ML MISC 4x daily injections 100 each 8   Lancet Devices (ACCU-CHEK SOFTCLIX) lancets Fill for 1 month for TID testing. Use as instructed 90 each 0   latanoprost  (XALATAN ) 0.005 % ophthalmic solution Place 1 drop into both eyes at bedtime.     melatonin 5 MG TABS Take 10 mg by mouth at bedtime as needed (for insomnia).     midodrine  (PROAMATINE ) 2.5 MG tablet Take 2.5 mg by mouth.     Multiple Vitamin (MULTIVITAMIN WITH MINERALS) TABS Take 1 tablet by mouth daily.     NEEDLE, DISP, 30 G (BD DISP NEEDLES) 30G X 1/2 MISC For 4x daily injections 100 each 13   NOVOLIN R 100 UNIT/ML injection Inject 0-9 Units into the skin in the morning, at noon, and at bedtime. Per sliding scale     oxyCODONE  (OXY IR/ROXICODONE ) 5 MG immediate release tablet Take 1 tablet (5 mg total) by mouth every 6 (six) hours as needed for moderate pain (pain score 4-6). 5 tablet 0   vancomycin  IVPB Inject 750 mg into the vein every Monday, Wednesday, and Friday with hemodialysis. Indication:  MRSA Bacteremia Last Day of Therapy:  06/16/24 Labs - Once weekly:  Vancomycin  trough     Labs: Basic Metabolic Panel: Recent Labs  Lab 05/29/24 1008  NA 141  K 3.5  CL 105  CO2 24  GLUCOSE 165*  BUN 13  CREATININE 4.47*  CALCIUM  8.4*   Liver Function Tests: No results for input(s): AST, ALT, ALKPHOS, BILITOT, PROT, ALBUMIN  in the last 168 hours. No results for input(s): LIPASE, AMYLASE in the last 168 hours. No results for input(s): AMMONIA in the last 168 hours. CBC: Recent Labs  Lab 05/29/24 1008  WBC 12.4*  NEUTROABS 9.1*  HGB 8.5*  HCT 29.8*  MCV 87.1  PLT 293   Cardiac Enzymes: No results for input(s): CKTOTAL, CKMB, CKMBINDEX, TROPONINI in the last 168 hours. CBG: No results for input(s): GLUCAP in the last 168 hours. Iron  Studies: No results for input(s): IRON , TIBC, TRANSFERRIN, FERRITIN in the last 72 hours. Studies/Results: No results found.  ROS: All others negative except those listed in HPI.  Physical Exam: Vitals:   05/29/24 0957 05/29/24 1502  BP:  (!) 141/62  Pulse:  75  Resp:  18  Temp:  98.3 F (36.8 C)  SpO2: 98% 100%  General: WDWN NAD Head: Sclera not icteric  Lungs: CTA bilaterally. No wheeze, rales or rhonchi. Breathing is unlabored. Heart: RRR. No murmur, rubs or gallops.  Abdomen: soft, non-tender Lower extremities: No LLE edema; R BKA; sacral wound Neuro: AAOx3. Moves all extremities spontaneously. Dialysis Access: [facility]  Dialysis Orders:  MWF- [facility] 4hrs, BFR 350, DFR Auto 1.5,  EDW 67.1kg, 3K/ 2.5Ca Heparin  2500 unit bolus with hD Mircera 225 mcg q2wks - last 05/15/24 Calcitriol  0.25mcg PO qHD - last dose 05/27/24  Last Labs: Hgb 8.5,  K 3.5, Ca 8.4  Assessment/Plan: R BKA wound dehiscence: wound care following Sacral decubitus ulcer: Surgery PA at bedside performing debridement. Surgery recommends ID consult. CT ABD/pelvis ordered by Hospitalist to assess depth of wound (r/o osteo).  ESRD -  on HD MWF. No urgent need for  HD today. Plan for HD tomorrow morning. She was on IV Vanc 750mh with HD in outpatient Hypertension/volume  - BP acceptable. Not overloaded on exam. EDW recently lowered in outpatient Anemia of CKD - Hgb 8.5, will obtain iron  studies. Although on IV ABXs. She was getting IV Fe in outpatient. Secondary Hyperparathyroidism -  Checking phos in AM Nutrition - Renal diet with fluid restriction  Charmaine Piety, NP [facility] 05/29/2024, 4:20 PM

## 2024-05-29 NOTE — ED Triage Notes (Signed)
 Patient BIB EMS from doctor's office where she has a wound dehiscence to her right BKA. Patient had the BKA a month ago and had staples taken out last Tuesday, except for 2. Patient is on eliquis .

## 2024-05-29 NOTE — ED Provider Notes (Signed)
 [facility] EMERGENCY DEPARTMENT AT [facility] Provider Note   CSN: [id number] Arrival date & time: 05/29/24  9053     Patient presents with: Wound Dehiscence   [name] is a 68 y.o. female.   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  Patient was seen in clinic and had most of her staples removed on 05/19/2024.  She reports going home and her son was taking her down the steps today her leg accidentally had a step and the wound busted open and she has had some bleeding since then.  Says that she is concerned about going back home because she does not have all of the necessary equipment yet.  Denies any fevers or chills.  No purulent drainage from the wound or pain near the wound otherwise       Prior to Admission medications   Medication Sig Start Date End Date Taking? Authorizing Provider  ACCU-CHEK AVIVA PLUS test strip CHECK BLOOD GLUCOSE THREE TIMES DAILY 11/05/22   Joshua Domino, DO  albuterol  (PROVENTIL ) (2.5 MG/3ML) 0.083% nebulizer solution Take 3 mLs (2.5 mg total) by nebulization every 2 (two) hours as needed for wheezing. 11/25/23   Ghimire, Donalda HERO, MD  apixaban  (ELIQUIS ) 5 MG TABS tablet Take 2 tablets (10 mg total) by mouth 2 (two) times daily for 3 days, THEN 1 tablet (5 mg total) 2 (two) times daily. 04/28/24 07/30/24  Odell Celinda Balo, MD  Blood Glucose Monitoring Suppl (ACCU-CHEK AVIVA PLUS) w/Device KIT CHECK BLOOD GLUCOSE THREE TIMES DAILY 10/26/20   Autry-Lott, Rojean, DO  brimonidine  (ALPHAGAN ) 0.2 % ophthalmic solution Place 1 drop into both eyes 2 (two) times daily.    [provider]  calcitRIOL  (ROCALTROL ) 0.25 MCG capsule TAKE 1 CAPSULE (0.25 MCG TOTAL) EVERY MONDAY, WEDNESDAY, AND FRIDAY Patient taking differently: Take 0.25 mcg by mouth in the morning and at  bedtime. 06/22/22   Christia Budds, MD  carvedilol  (COREG ) 12.5 MG tablet TAKE 1 TABLET (12.5 MG TOTAL) BY MOUTH 2 (TWO) TIMES DAILY WITH A MEAL. 12/22/21   Autry-Lott, Rojean, DO  DROPLET INSULIN  SYRINGE 31G X 5/16 0.5 ML MISC USE FOUR TIMES DAILY FOR INJECTIONS 09/03/22   Joshua Domino, DO  insulin  aspart (NOVOLOG  FLEXPEN) 100 UNIT/ML FlexPen 0-9 Units, Subcutaneous, 3 times daily with meals CBG < 70: Implement Hypoglycemia measures CBG 70 - 120: 0 units CBG 121 - 150: 1 unit CBG 151 - 200: 2 units CBG 201 - 250: 3 units CBG 251 - 300: 5 units CBG 301 - 350: 7 units CBG 351 - 400: 9 units CBG > 400: call MD Patient not taking: Reported on 04/19/2024 11/25/23   Raenelle Donalda HERO, MD  insulin  glargine (LANTUS ) 100 UNIT/ML injection Inject 0.07 mLs (7 Units total) into the skin daily. Please schedule appointment before next refill. Patient taking differently: Inject 10 Units into the skin daily. Please schedule appointment before next refill. 02/01/24   [name], MD  Insulin  Syringes, Disposable, U-100 0.5 ML MISC 4x daily injections 03/21/18   Riccio, Angela C, DO  Lancet Devices (ACCU-CHEK [facility]) lancets Fill for 1 month for TID testing. Use as instructed 11/13/13   Curtis Hadassah DASEN, MD  latanoprost  (XALATAN ) 0.005 % ophthalmic solution Place 1 drop into both eyes at bedtime.    [provider]  melatonin 5 MG TABS Take 10  mg by mouth at bedtime as needed (for insomnia).    [provider]  midodrine  (PROAMATINE ) 2.5 MG tablet Take 2.5 mg by mouth.    [provider]  Multiple Vitamin (MULTIVITAMIN WITH MINERALS) TABS Take 1 tablet by mouth daily.    [provider]  NEEDLE, DISP, 30 G (BD DISP NEEDLES) 30G X 1/2 MISC For 4x daily injections 11/05/15   Phelps, Jazma Y, DO  NOVOLIN R 100 UNIT/ML injection Inject 0-9 Units into the skin in the morning, at noon, and at bedtime. Per sliding scale 04/03/24   [provider]  oxyCODONE  (OXY  IR/ROXICODONE ) 5 MG immediate release tablet Take 1 tablet (5 mg total) by mouth every 6 (six) hours as needed for moderate pain (pain score 4-6). 04/27/24   Odell Celinda Balo, MD  vancomycin  IVPB Inject 750 mg into the vein every Monday, Wednesday, and Friday with hemodialysis. Indication:  MRSA Bacteremia Last Day of Therapy:  06/16/24 Labs - Once weekly: Vancomycin  trough 04/24/24 06/16/24  Vu, Constance T, MD    Allergies: Nsaids, Lisinopril, and Peanut-containing drug products    Review of Systems  Updated Vital Signs SpO2 98%   Physical Exam Constitutional:      General: She is not in acute distress.    Appearance: Normal appearance.  Musculoskeletal:     Comments: Right lower extremity BKA.  Small amount of subcutaneous tissue extruding from the wound.  No erythema or warmth.  No purulent discharge.  Neurological:     Mental Status: She is alert.    Right lower extremity BKA  (all labs ordered are listed, but only abnormal results are displayed) Labs Reviewed  CBC WITH DIFFERENTIAL/PLATELET - Abnormal; Notable for the following components:      Result Value   WBC 12.4 (*)    RBC 3.42 (*)    Hemoglobin 8.5 (*)    HCT 29.8 (*)    MCH 24.9 (*)    MCHC 28.5 (*)    RDW 20.7 (*)    Neutro Abs 9.1 (*)    Monocytes Absolute 1.4 (*)    Abs Immature Granulocytes 0.18 (*)    All other components within normal limits  BASIC METABOLIC PANEL WITH GFR - Abnormal; Notable for the following components:   Glucose, Bld 165 (*)    Creatinine, Ser 4.47 (*)    Calcium  8.4 (*)    GFR, Estimated 10 (*)    All other components within normal limits    EKG: None  Radiology: No results found.   .Laceration Repair  Date/Time: 05/29/2024 11:42 AM  Performed by: [name], MD Authorized by: [name], MD   Consent:    Consent obtained:  Verbal   Consent given by:  Patient   Risks discussed:  Infection, pain, retained foreign body, need for additional repair and poor  cosmetic result Universal protocol:    Patient identity confirmed:  Verbally with patient Anesthesia:    Anesthesia method:  Local infiltration   Local anesthetic:  Lidocaine  2% WITH epi Laceration details:    Location:  Leg   Leg location: Right BKA stump.   Length (cm):  5 Treatment:    Area cleansed with:  Chlorhexidine  Skin repair:    Repair method:  Staples   Number of staples:  5 Approximation:    Approximation:  Close Repair type:    Repair type:  Simple Post-procedure details:    Dressing:  Non-adherent dressing   Procedure completion:  Tolerated well,  no immediate complications Comments:     2 remaining staples were left in place from her prior surgery.  I added 5 additional staples to close the wound.    Medications Ordered in the ED  lidocaine -EPINEPHrine  (XYLOCAINE  W/EPI) 2 %-1:200000 (PF) injection 10 mL (10 mLs Intradermal Given by Other 05/29/24 1033)    Clinical Course as of 05/29/24 1624  Fri May 29, 2024  1109 Dw Dominique Kenner from orthopedics.  [RP]  1414 Dr. Ann contacted.  Recommends talking to Burnard Banter from surgery [RP]  1610 Dr Constance Passer consulted from infectious disease.  Recommends blood cultures.  Recommends holding off on antibiotics at this point in time.  They will see the patient in the hospital when admitted [RP]  1620 Dw Ozell Kung MD from internal medicine teaching service for admission. [RP]    Clinical Course User Index [RP] [name], MD                                 Medical Decision Making Amount and/or Complexity of Data Reviewed Labs: ordered.  Risk Prescription drug management.   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  Hit it on the way down from the stairs at her home.  No other signs of trauma on exam.  Does have a dehisced wound.  No signs of infection at this point in time. It was cleaned and stapled back together.  Did discuss with orthopedics did not feel that additional interventions were required at this point in time.  Sounds like she is having some difficulty getting around her house at this point in time because she does not yet have some the supplies that she needs.  Will have her board in the ED and reach out to PT and OT as well as social work to try and get the equipment necessary for home.  Did place a wound care consult.  Also placed a nephrology consult for her ESRD.  Will have her follow-up in 1 to 2 weeks with her orthopedic doctor to have the staples removed.  This patient presents to the ED for concern of complaints listed in HPI, this involves an extensive number of treatment options, and is a complaint that carries with it a high risk of complications and morbidity. Disposition including potential need for admission considered.   Dispo: TOC border  Records reviewed Outpatient Clinic Notes The following labs were independently interpreted: Chemistry and show CKD I have reviewed the patients home medications and made adjustments as needed Consults: Nephrology Social Determinants of health:  Geriatric  Portions of this note were generated with Scientist, clinical (histocompatibility and immunogenetics). Dictation errors may occur despite best attempts at proofreading.     Final diagnoses:  Wound dehiscence  ESRD (end stage renal disease) [facility])    ED Discharge Orders     None          [name], MD 05/29/24 1150

## 2024-05-29 NOTE — Progress Notes (Signed)
 Clinical Course as of 05/29/24 1700  Fri May 29, 2024  1610 Dr Constance Passer consulted from infectious disease.  Recommends blood cultures.  Recommends holding off on antibiotics at this point in time.  They will see the patient in the hospital when admitted [RP]  1620 Dw Ozell Kung MD from internal medicine teaching service for admission. [RP]    Clinical Course User Index [RP] [name], MD

## 2024-05-29 NOTE — ED Provider Notes (Signed)
 Wound debrided by Burnard Banter from surgery.  Was able to probe to bone.  Will order CT abdomen pelvis at this point in time to assess the depth of the wound.  She is recommending infectious disease consultation.  Will admit the patient to the hospitalist.    [name], MD 05/29/24 [phone]

## 2024-05-29 NOTE — Consult Note (Addendum)
 WOC Nurse Consult Note: Reason for Consult: Requested to assess a wound on sacrum and amputation site (BKA) Wound type: Unstageable PI on sacrum, BKA wound dehiscence. (Look Dr. Yolande notes - cleaned and stapled back together, wrapped with Kerlix) Pressure Injury POA: Yes Sacrum Measurement: 11 cm x 5.5 cm x 2 cm at this point, not able to go further due the tunneling on 11 to 2 o'clock. Wound bed: 100% yellow slough. Drainage (amount, consistency, odor) Moderate amount, no odor, serous/yellow. Periwound: partial healed, pink tissue, intact, no maceration. Dressing procedure/placement/frequency: Cleanse with Vashe [id number], not rinse, pat dry the peri-wound skin, the wound bed can stay moist.  Apply to sacrum wound bed daily, including the tunneling, pack with moist gauze and cover with foam dressing, change every 3 days or PRN. Cover with foam dressing, change everyday or PRN.  Secure chat with MD attending to discuss about sacrum wound debridement.  Conservative sharp wound debridement (CSWD performed at the bedside): using sharp 11. Partial yellow slough was removed.  WOC Nurse ostomy consult note Stoma type/location: LMQ colostomy. Pt is well educated, known by [facility] team. Ostomy pouching: 2 pc soft convex #644, bag 234. Ring [id number]. Education provided:  Pt well educated, The pouch was intact, no leaking.  Bed nurse instructions: - Cleanse the skin with saline and soft wash cloth or gauze, pat dry the peri-ostomy skin. - Cut the barrier as the ostomy size and shape. - Applied the ring, stretching and modeling, until go around the ostomy or around the cut. - Take the plastic protection off, apply to his skin, take the drape protection off, adapting in her abdomen. - Close the lock in roll system. - Empty when is 1/3 full or 1/2 full. - Change the pouch 2 times per week or if is leaking.  Enrolled patient in [employer] DC program: Yes, previous.  WOC team will not  plan to follow further. Please reconsult if further assistance is needed. Thank-you,  Lela Holm RN, CNS, [employer], MSN.  (Phone [phone])

## 2024-05-29 NOTE — Evaluation (Signed)
 Occupational Therapy Evaluation Patient Details Name: [name] MRN: [id number] DOB: 1956-08-22 Today's Date: 05/29/2024   History of Present Illness   68 y.o. female presentsd to [facility] hospital on 05/29/2024 with R BKA wound dehiscence.PMHx: ESRD on HD, colostomy, T2DM, Afib, Rt ankle ORIF, CAD     Clinical Impressions [name] was evaluated s/p the above admission list. She was recently discharged home from SNF, she has been needing assist for most mobility and ADLs from her son's at home and they have been dragging her up the stairs. Upon evaluation the pt was limited by R BKA, weakness, wound pain, limited activity tolerance and unsteady balance. Overall she needs min A for mobility with RW, she is unable to hop with RW but pivots well. Due to the deficits listed below the pt also needs up to min A for LB ADLs and set up A for UB ADLs at bed level. Pt will benefit from continued acute OT services to discharge home with necessary DME.   Of note, pt states she cannot safety go home without a hospital bed and a power wheelchair. She states that if she cannot get that equipment, then she would like to go back to rehab.      If plan is discharge home, recommend the following:   A little help with walking and/or transfers;A little help with bathing/dressing/bathroom;Assistance with cooking/housework;Assist for transportation;Help with stairs or ramp for entrance     Functional Status Assessment   Patient has had a recent decline in their functional status and demonstrates the ability to make significant improvements in function in a reasonable and predictable amount of time.     Equipment Recommendations   Hospital bed      Precautions/Restrictions   Precautions Precautions: Fall Recall of Precautions/Restrictions: Intact Precaution/Restrictions Comments: R BKA Restrictions Weight Bearing Restrictions Per Provider Order: Yes RLE Weight Bearing Per Provider Order: Non  weight bearing     Mobility Bed Mobility Overal bed mobility: Needs Assistance Bed Mobility: Rolling, Sidelying to Sit, Sit to Supine Rolling: Supervision Sidelying to sit: Min assist   Sit to supine: Supervision        Transfers Overall transfer level: Needs assistance Equipment used: Rolling walker (2 wheels) Transfers: Sit to/from Stand Sit to Stand: Min assist                  Balance Overall balance assessment: Needs assistance Sitting-balance support: No upper extremity supported, Feet supported Sitting balance-Leahy Scale: Good     Standing balance support: Bilateral upper extremity supported, Reliant on assistive device for balance Standing balance-Leahy Scale: Poor                             ADL either performed or assessed with clinical judgement   ADL Overall ADL's : Needs assistance/impaired Eating/Feeding: Independent   Grooming: Set up;Sitting   Upper Body Bathing: Set up;Sitting   Lower Body Bathing: Minimal assistance;Sitting/lateral leans   Upper Body Dressing : Set up;Sitting   Lower Body Dressing: Moderate assistance;Sitting/lateral leans   Toilet Transfer: Minimal assistance;Stand-pivot   Toileting- Clothing Manipulation and Hygiene: Contact guard assist;Sitting/lateral lean       Functional mobility during ADLs: Minimal assistance General ADL Comments: limited by R BKA, weakness and poor acivity tolerance     Vision Baseline Vision/History: 0 No visual deficits Vision Assessment?: No apparent visual deficits     Perception Perception: Not tested  Praxis Praxis: Not tested       Pertinent Vitals/Pain Pain Assessment Faces Pain Scale: Hurts little more Pain Location: R BKA Pain Descriptors / Indicators: Sore     Extremity/Trunk Assessment Upper Extremity Assessment Upper Extremity Assessment: Overall WFL for tasks assessed   Lower Extremity Assessment Lower Extremity Assessment: Defer to PT  evaluation RLE Deficits / Details: ROM WFL, R residual limb dressed with dried blood noted but no fresh spots observed LLE Deficits / Details: ROM WFL, generalized weakness   Cervical / Trunk Assessment Cervical / Trunk Assessment: Normal   Communication Communication Communication: No apparent difficulties   Cognition Arousal: Alert Behavior During Therapy: WFL for tasks assessed/performed Cognition: No apparent impairments                               Following commands: Intact       Cueing  General Comments   Cueing Techniques: Verbal cues  VSS on RA   Exercises     Shoulder Instructions      Home Living Family/patient expects to be discharged to:: Private residence Living Arrangements: Children Available Help at Discharge: Family;Available 24 hours/day Type of Home: House Home Access: Level entry     Home Layout: Multi-level Alternate Level Stairs-Number of Steps: 5 Alternate Level Stairs-Rails: Left Bathroom Shower/Tub: Chief Strategy Officer: Standard Bathroom Accessibility: Yes   Home Equipment: Cane - single point;Wheelchair - Forensic psychologist (2 wheels);Shower seat;BSC/3in1          Prior Functioning/Environment Prior Level of Function : Needs assist             Mobility Comments: pt has been requiring assistance for transfers, wheelchair mobility, and reports that her sons have been having to drag her up/down the steps to get to her bedroom ADLs Comments: independent prior to R BKA - since return home (1 week), sons assist with BSC transers and bathing, pt is able to dress with mod I    OT Problem List: Decreased strength;Decreased range of motion;Decreased activity tolerance;Impaired balance (sitting and/or standing);Decreased safety awareness;Decreased knowledge of use of DME or AE;Decreased knowledge of precautions   OT Treatment/Interventions: Self-care/ADL training;Therapeutic exercise;DME and/or AE  instruction;Therapeutic activities;Balance training;Patient/family education      OT Goals(Current goals can be found in the care plan section)   Acute Rehab OT Goals Patient Stated Goal: home with needed DME OT Goal Formulation: With patient Time For Goal Achievement: 06/12/24 Potential to Achieve Goals: Good ADL Goals Pt Will Perform Lower Body Bathing: with modified independence Pt Will Perform Lower Body Dressing: with modified independence Pt Will Transfer to Toilet: with modified independence   OT Frequency:  Min 2X/week       AM-PAC OT 6 Clicks Daily Activity     Outcome Measure Help from another person eating meals?: None Help from another person taking care of personal grooming?: A Little Help from another person toileting, which includes using toliet, bedpan, or urinal?: A Little Help from another person bathing (including washing, rinsing, drying)?: A Little Help from another person to put on and taking off regular upper body clothing?: A Little Help from another person to put on and taking off regular lower body clothing?: A Little 6 Click Score: 19   End of Session Equipment Utilized During Treatment: Gait belt Nurse Communication: Mobility status  Activity Tolerance: Patient tolerated treatment well Patient left: in bed  OT Visit Diagnosis: Unsteadiness on feet (R26.81);Other  abnormalities of gait and mobility (R26.89);Muscle weakness (generalized) (M62.81)                Time: 8589-8571 OT Time Calculation (min): 18 min Charges:  OT General Charges $OT Visit: 1 Visit OT Evaluation $OT Eval Moderate Complexity: 1 Mod  [name], OTR/L Acute Rehabilitation Services Office [phone] Secure Chat Communication Preferred   Connie JONETTA King 05/29/2024, 3:08 PM

## 2024-05-29 NOTE — Consult Note (Signed)
 [name] [birth year]  [id number].    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 During her ED visit her sacral wound was assessed by the [facility] RN who noted the wound to be unstagable and covered with yellow fibrinous exudate, therefore general surgery has been asked to consult.   Substance hx: denies tobacco, alcohol , or drug use Blood thinners: Eliquis  5 mg BID  ROS: Review of Systems  All other systems reviewed and are negative.   Family History  Problem Relation Age of Onset   Diabetes Mother        No history CAD   Colon cancer Mother 42       died at 56 from CRC   Hypertension Father        Also had CAD   Heart disease Father    Cervical cancer Sister    Diabetes Sister    Congestive Heart Failure Sister    Heart disease Maternal Grandmother    Diabetes Brother    Kidney disease Brother    Sickle cell anemia Son    Thyroid  disease Son    Breast cancer Neg Hx    BRCA 1/2 Neg Hx     Past Medical History:  Diagnosis Date   Anemia    Arthritis    Atrial fibrillation (HCC)    CAD (coronary artery disease)    Chronic kidney disease, stage III (moderate) (HCC)    Constipation    Diabetes mellitus (HCC)    History of blood transfusion    HLD (hyperlipidemia)    Hypertension    Myocardial infarction [facility])    in April 2014    Past Surgical History:  Procedure Laterality Date   AMPUTATION Right 04/20/2024   Procedure: AMPUTATION BELOW KNEE;  Surgeon: Harden Jerona GAILS, MD;  Location: [facility] OR;  Service: Orthopedics;  Laterality: Right;   ANKLE SURGERY Right    x 6 total   BREAST BIOPSY Left    15 or 20 yearsa ago she cant remember laterality    HARDWARE REMOVAL Right 02/13/2013   Procedure:  HARDWARE REMOVAL;  Surgeon: Jerona GAILS Harden, MD;  Location: [facility] OR;  Service: Orthopedics;  Laterality: Right;  Right Ankle Removal Hardware, Debridement, Place Wound VAC   IR FLUORO GUIDE CV LINE LEFT  11/08/2023   IR FLUORO GUIDE CV LINE LEFT  04/23/2024   IR FLUORO GUIDE CV LINE RIGHT  11/01/2023   IR FLUORO GUIDE CV LINE RIGHT  11/12/2023   IR REMOVAL TUN CV CATH W/O FL  04/21/2024   IR US  GUIDE VASC ACCESS LEFT  11/08/2023   IR US  GUIDE VASC ACCESS LEFT  04/23/2024   IR US  GUIDE VASC ACCESS RIGHT  11/01/2023   IR US  GUIDE VASC ACCESS RIGHT  11/12/2023   laser surgery for diabetic retinopathy Bilateral    LEFT HEART CATHETERIZATION WITH CORONARY ANGIOGRAM N/A 01/09/2013   Procedure: LEFT HEART CATHETERIZATION WITH CORONARY ANGIOGRAM;  Surgeon: Toribio JONELLE Fuel, MD;  Location: [facility] CATH LAB;  Service: Cardiovascular;  Laterality: N/A;   ORIF ANKLE FRACTURE Right 01/12/2013   Procedure: OPEN REDUCTION INTERNAL FIXATION (ORIF) ANKLE FRACTURE;  Surgeon: Jerona GAILS Harden, MD;  Location: [facility] OR;  Service: Orthopedics;  Laterality: Right;  PARTIAL COLECTOMY N/A 11/02/2023   Procedure: OPEN PARTIAL COLECTOMY WITH END COLOSTOMY;  Surgeon: Dasie Leonor CROME, MD;  Location: [facility] OR;  Service: General;  Laterality: N/A;   PARTIAL HYSTERECTOMY     TUNNELLED CATHETER EXCHANGE N/A 04/15/2024   Procedure: TUNNELLED CATHETER EXCHANGE;  Surgeon: Pearline Norman RAMAN, MD;  Location: [facility] LAB;  Service: Cardiovascular;  Laterality: N/A;    Social History:  reports that she has never smoked. She has never used smokeless tobacco. She reports that she does not drink alcohol  and does not use drugs.  Allergies:  Allergies  Allergen Reactions   Nsaids Other (See Comments)    CKD stage 4   Lisinopril Other (See Comments)    coughing   Peanut-Containing Drug Products Itching and Other (See Comments)    GI intolerance - diarrhea    (Not in a hospital admission)    Physical Exam: Blood pressure (!) 141/62, pulse 75, temperature  98.3 F (36.8 C), resp. rate 18, SpO2 100%. General: Pleasant, WD, WN, appears stated age, NAD. Pulm- breathing is non-labored ORA Abd- soft, NT/ND, colostomy present and functional MSK- R BKA w. Kerlix dressing.  Skin: warm and dry, no rashes or lesions.  However she does have an unstageable sacral decubitus ulcer present.  See debridement note.  This does go to her sacrum with pieces of bone broken off in the wound. Psych: A&Ox3   Results for orders placed or performed during the hospital encounter of 05/29/24 (from the past 48 hours)  CBC with Differential     Status: Abnormal   Collection Time: 05/29/24 10:08 AM  Result Value Ref Range   WBC 12.4 (H) 4.0 - 10.5 K/uL   RBC 3.42 (L) 3.87 - 5.11 MIL/uL   Hemoglobin 8.5 (L) 12.0 - 15.0 g/dL   HCT 70.1 (L) 63.9 - 53.9 %   MCV 87.1 80.0 - 100.0 fL   MCH 24.9 (L) 26.0 - 34.0 pg   MCHC 28.5 (L) 30.0 - 36.0 g/dL   RDW 79.2 (H) 88.4 - 84.4 %   Platelets 293 150 - 400 K/uL   nRBC 0.0 0.0 - 0.2 %   Neutrophils Relative % 73 %   Neutro Abs 9.1 (H) 1.7 - 7.7 K/uL   Lymphocytes Relative 12 %   Lymphs Abs 1.5 0.7 - 4.0 K/uL   Monocytes Relative 12 %   Monocytes Absolute 1.4 (H) 0.1 - 1.0 K/uL   Eosinophils Relative 0 %   Eosinophils Absolute 0.0 0.0 - 0.5 K/uL   Basophils Relative 1 %   Basophils Absolute 0.1 0.0 - 0.1 K/uL   Immature Granulocytes 2 %   Abs Immature Granulocytes 0.18 (H) 0.00 - 0.07 K/uL    Comment: Performed at [facility], [address], [place], [place] [address]  Basic metabolic panel     Status: Abnormal   Collection Time: 05/29/24 10:08 AM  Result Value Ref Range   Sodium 141 135 - 145 mmol/L   Potassium 3.5 3.5 - 5.1 mmol/L   Chloride 105 98 - 111 mmol/L   CO2 24 22 - 32 mmol/L   Glucose, Bld 165 (H) 70 - 99 mg/dL    Comment: Glucose reference range applies only to samples taken after fasting for at least 8 hours.   BUN 13 8 - 23 mg/dL   Creatinine, Ser 5.52 (H) 0.44 - 1.00 mg/dL   Calcium  8.4 (L)  8.9 - 10.3 mg/dL   GFR, Estimated 10 (L) >60 mL/min  Comment: (NOTE) Calculated using the CKD-EPI Creatinine Equation (2021)    Anion gap 12 5 - 15    Comment: Performed at [facility], [address], [place], [place] [address]   No results found.    Assessment/Plan Sacral decubitus ulcer without evidence of infection  History, labs, imaging, have all been personally reviewed. She is hemodynamically stable, WBC is 12 (from 13.5 3 weeks ago). I examined the patients sacral wound which showed fibrinous exudate without surrounding cellulitis. The wound was debrided. See debridement note for further details. Recommend BID dressing changes with santyl  and moist-to-dry dressing changes. Recommend off-loading pressure and frequent turning/repositioning.  Would recommend PT hydrotherapy.  She also has palpable bone on exam and some bone was sent for culture.  Would recommend ID consult for management of presumed osteomyelitis given fractured bone noted in the wound.   No further needs for surgical debridement or intervention.  We will sign off at this time, but available if needed.  Discussed recommendations with EDP.  FEN - per primary, ok for a diet from a surgical perspective VTE - SCD's, Eliquis  ID - IV vanc for below recent RLE infection   Below per primary team: Recent history of MRSA bacteremia related to RLE necrotizing fasciitis  Afib on Eliquis   DVT of right internal jugular vein 04/24/24, related to IV catheter, on Eliquis  CAD, Hx MI 2014 ESRD on HD DM2 on insulin   HTN HLD   I reviewed ED provider notes, last 24 h vitals and pain scores, and last 24 h labs and trends.  Burnard FORBES Banter, [facility] Surgery 05/29/2024, 4:32 PM Please see Amion for pager number during day hours 7:00am-4:30pm or 7:00am -11:30am on weekends

## 2024-05-29 NOTE — ED Provider Notes (Signed)
 Discussed with Burnard Banter from general surgery. She will come see the patient and possibly perform a bedside debridement. Feels that she should fu in clinic for wound care but does not need an emergent debridement. Recommends santyl  and dressing changes for now.    [name], MD 05/29/24 [phone]

## 2024-05-29 NOTE — H&P (Addendum)
 Date: 05/30/2024               Patient Name:  [name] MRN: [id number]  DOB: [birth year] Age / Sex: 68 y.o., female   PCP: Campbell Reynolds, NP         Medical Service: Internal Medicine Teaching Service         Attending Physician: Dr. Mliss Pouch      First Contact: Schuyler Novak, DO    Second Contact: Dr. Ozell Kung, MD          Pager Information: First Contact Pager: [phone]   Second Contact Pager: [phone]   SUBJECTIVE   Chief Complaint: Sacral Wound  History of Present Illness: [name] is a 68 y.o. 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  She reports that she was on her way to the doctor's office this morning and looked down and saw bloody skin hanging from her previous BKA incision site. At that time she decided to report to the ER. She reports that over the past 6 months she has been through a rough season with her health.  In February of this year she had diverticulitis that resulted in an exploratory laparotomy and colostomy formation.  In July of this year she was hospitalized for septic shock secondary to necrotizing fasciitis of the right lower extremity.  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 She has had difficulty healing since and issues that her mobility noting that she spends most of the day on the couch or in bed and can only move around her house if her sons carry her.  Upon presentation to the ER she reported to the ER doctor that she was also having pain in her sacral area knew that there was at least a small wound back there.  She stated that she obtained this wound while she was hospitalized in July however she was unable to afford the prescribed treatment cream for  it due to her insurance not covering it.  Alcohol  her son has been putting antibacterial ointment and wound care once a day.  She denies fever, chills, chest pain, shortness of breath, abdominal pain, nausea, vomiting, and diarrhea   ED Course: Labs significant for WBC 12.4, Hgb 8.5, glucose 165, Cr. 4.47, Ca 8.4. Imaging CTAP pending Received Eliquis  Consulted ID, General Surgery, and IMTS  Meds:  Eliquis  5 mg twice daily Amlodipine  5 mg daily Carvedilol  12.5 mg daily Repatha once monthly Furosemide  40 mg twice daily Lantus  7 mg daily  Current Meds  Medication Sig   albuterol  (PROVENTIL ) (2.5 MG/3ML) 0.083% nebulizer solution Take 3 mLs (2.5 mg total) by nebulization every 2 (two) hours as needed for wheezing.   brimonidine  (ALPHAGAN ) 0.2 % ophthalmic solution Place 1 drop into both eyes 2 (two) times daily.   calcitRIOL  (ROCALTROL ) 0.25 MCG capsule TAKE 1 CAPSULE (0.25 MCG TOTAL) EVERY MONDAY, WEDNESDAY, AND FRIDAY (Patient taking differently: Take 0.25 mcg by mouth in the morning and at bedtime.)   carvedilol  (COREG ) 12.5 MG tablet TAKE 1 TABLET (12.5 MG TOTAL) BY MOUTH 2 (TWO) TIMES DAILY WITH A MEAL.   ELIQUIS  2.5 MG TABS tablet Take 2.5 mg by mouth 2 (two) times daily.   insulin   glargine (LANTUS ) 100 UNIT/ML injection Inject 0.07 mLs (7 Units total) into the skin daily. Please schedule appointment before next refill. (Patient taking differently: Inject 7-10 Units into the skin daily. Please schedule appointment before next refill.)   latanoprost  (XALATAN ) 0.005 % ophthalmic solution Place 1 drop into both eyes at bedtime.   melatonin 5 MG TABS Take 10 mg by mouth at bedtime as needed (for insomnia).   midodrine  (PROAMATINE ) 2.5 MG tablet Take 2.5 mg by mouth daily as needed (low bp).   Multiple Vitamin (MULTIVITAMIN WITH MINERALS) TABS Take 1 tablet by mouth daily.   NOVOLIN R 100 UNIT/ML injection Inject 0-9 Units into the skin in the morning, at noon, and at bedtime. Per sliding  scale   vancomycin  IVPB Inject 750 mg into the vein every Monday, Wednesday, and Friday with hemodialysis. Indication:  MRSA Bacteremia Last Day of Therapy:  06/16/24 Labs - Once weekly: Vancomycin  trough    Past Medical History Hypertension Coronary artery disease Atrial fibrillation Acute diverticulitis treated with partial colectomy and colostomy 10/2023 ESRD on HD (became dialysis-dependent 10/2023) Type 2 diabetes Hyperlipidemia Necrotizing fasciitis with septic shock resulting in BKA on 04/20/24  Past Surgical History Past Surgical History:  Procedure Laterality Date   AMPUTATION Right 04/20/2024   Procedure: AMPUTATION BELOW KNEE;  Surgeon: Harden Jerona GAILS, MD;  Location: [facility] OR;  Service: Orthopedics;  Laterality: Right;   ANKLE SURGERY Right    x 6 total   BREAST BIOPSY Left    15 or 20 yearsa ago she cant remember laterality    HARDWARE REMOVAL Right 02/13/2013   Procedure: HARDWARE REMOVAL;  Surgeon: Jerona GAILS Harden, MD;  Location: [facility] OR;  Service: Orthopedics;  Laterality: Right;  Right Ankle Removal Hardware, Debridement, Place Wound VAC   IR FLUORO GUIDE CV LINE LEFT  11/08/2023   IR FLUORO GUIDE CV LINE LEFT  04/23/2024   IR FLUORO GUIDE CV LINE RIGHT  11/01/2023   IR FLUORO GUIDE CV LINE RIGHT  11/12/2023   IR REMOVAL TUN CV CATH W/O FL  04/21/2024   IR US  GUIDE VASC ACCESS LEFT  11/08/2023   IR US  GUIDE VASC ACCESS LEFT  04/23/2024   IR US  GUIDE VASC ACCESS RIGHT  11/01/2023   IR US  GUIDE VASC ACCESS RIGHT  11/12/2023   laser surgery for diabetic retinopathy Bilateral    LEFT HEART CATHETERIZATION WITH CORONARY ANGIOGRAM N/A 01/09/2013   Procedure: LEFT HEART CATHETERIZATION WITH CORONARY ANGIOGRAM;  Surgeon: Toribio JONELLE Fuel, MD;  Location: [facility] CATH LAB;  Service: Cardiovascular;  Laterality: N/A;   ORIF ANKLE FRACTURE Right 01/12/2013   Procedure: OPEN REDUCTION INTERNAL FIXATION (ORIF) ANKLE FRACTURE;  Surgeon: Jerona GAILS Harden, MD;  Location: [facility] OR;  Service: Orthopedics;   Laterality: Right;   PARTIAL COLECTOMY N/A 11/02/2023   Procedure: OPEN PARTIAL COLECTOMY WITH END COLOSTOMY;  Surgeon: Dasie Leonor CROME, MD;  Location: [facility] OR;  Service: General;  Laterality: N/A;   PARTIAL HYSTERECTOMY     TUNNELLED CATHETER EXCHANGE N/A 04/15/2024   Procedure: TUNNELLED CATHETER EXCHANGE;  Surgeon: Pearline Norman RAMAN, MD;  Location: [facility];  Service: Cardiovascular;  Laterality: N/A;     Social:  Lives With: Her 2 sons Occupation: Unemployed Support: Family Level of Function: Dependent PCP:  Campbell Reynolds, NP   Family History:  Family History  Problem Relation Age of Onset   Diabetes Mother        No history CAD   Colon cancer Mother 43  died at 58 from CRC   Hypertension Father        Also had CAD   Heart disease Father    Cervical cancer Sister    Diabetes Sister    Congestive Heart Failure Sister    Heart disease Maternal Grandmother    Diabetes Brother    Kidney disease Brother    Sickle cell anemia Son    Thyroid  disease Son    Breast cancer Neg Hx    BRCA 1/2 Neg Hx      Allergies: Allergies as of 05/29/2024 - Review Complete 05/29/2024  Allergen Reaction Noted   Nsaids Other (See Comments) 06/19/2021   Lisinopril Other (See Comments) 10/09/2011   Peanut-containing drug products Itching and Other (See Comments) 02/12/2013    Review of Systems: A complete ROS was negative except as per HPI.   OBJECTIVE:   Physical Exam: Blood pressure (!) 141/62, pulse 75, temperature 98.3 F (36.8 C), resp. rate 18, SpO2 100%.  Constitutional: well-appearing female, in no acute distress HENT: normocephalic atraumatic, mucous membranes moist Eyes: conjunctiva non-erythematous Cardiovascular: Regular rate, no m/r/g Pulmonary/Chest: normal work of breathing on room air, lungs clear to auscultation bilaterally, diminished at the bases bilaterally Abdominal: soft, non-tender, non-distended, colostomy in place in left lower quadrant MSK: normal bulk  and tone, right BKA noted with dressing in place Skin: warm and dry  Labs: CBC    Component Value Date/Time   WBC 10.7 (H) 05/29/2024 1907   RBC 3.43 (L) 05/29/2024 1907   HGB 8.7 (L) 05/29/2024 1907   HGB 9.6 (L) 02/28/2021 1548   HCT 29.8 (L) 05/29/2024 1907   HCT 30.7 (L) 02/28/2021 1548   PLT 287 05/29/2024 1907   PLT 432 02/28/2021 1548   MCV 86.9 05/29/2024 1907   MCV 79 02/28/2021 1548   MCH 25.4 (L) 05/29/2024 1907   MCHC 29.2 (L) 05/29/2024 1907   RDW 20.7 (H) 05/29/2024 1907   RDW 15.0 02/28/2021 1548   LYMPHSABS 0.8 05/29/2024 1907   MONOABS 1.0 05/29/2024 1907   EOSABS 0.0 05/29/2024 1907   BASOSABS 0.1 05/29/2024 1907     CMP     Component Value Date/Time   NA 139 05/29/2024 1907   NA 137 02/28/2021 1548   K 3.4 (L) 05/29/2024 1907   CL 103 05/29/2024 1907   CO2 26 05/29/2024 1907   GLUCOSE 123 (H) 05/29/2024 1907   BUN 14 05/29/2024 1907   BUN 33 (H) 02/28/2021 1548   CREATININE 4.68 (H) 05/29/2024 1907   CREATININE 3.38 (H) 12/26/2015 1347   CALCIUM  8.2 (L) 05/29/2024 1907   PROT 5.7 (L) 05/06/2024 1808   PROT 7.0 11/07/2017 1414   ALBUMIN  1.9 (L) 05/29/2024 1907   ALBUMIN  4.0 11/07/2017 1414   AST 15 05/06/2024 1808   ALT 6 05/06/2024 1808   ALKPHOS 71 05/06/2024 1808   BILITOT 0.8 05/06/2024 1808   BILITOT 0.2 11/07/2017 1414   GFRNONAA 10 (L) 05/29/2024 1907   GFRNONAA 13 (L) 01/10/2015 1428   GFRAA 21 (L) 12/21/2019 1802   GFRAA 16 (L) 01/10/2015 1428    Imaging:  CT ABDOMEN PELVIS W CONTRAST Result Date: 05/29/2024 CLINICAL DATA:  Sacral wound EXAM: CT ABDOMEN AND PELVIS WITH CONTRAST TECHNIQUE: Multidetector CT imaging of the abdomen and pelvis was performed using the standard protocol following bolus administration of intravenous contrast. RADIATION DOSE REDUCTION: This exam was performed according to the departmental dose-optimization program which includes automated exposure control, adjustment of the mA and/or kV  according to patient  size and/or use of iterative reconstruction technique. CONTRAST:  75mL OMNIPAQUE  IOHEXOL  350 MG/ML SOLN COMPARISON:  CT abdomen and pelvis dated 11/06/2023 FINDINGS: Lower chest: Bibasilar patchy ground-glass opacities, likely atelectasis. No pleural effusion or pneumothorax demonstrated. Partially imaged multichamber cardiomegaly. Coronary artery calcifications. Hepatobiliary: No focal hepatic lesions. No intra or extrahepatic biliary ductal dilation. Cholelithiasis. Pancreas: 1.1 x 0.6 cm hypodensity in the pancreatic neck (3:26), not well seen on prior examinations in the absence of contrast enhancement. Spleen: Normal in size without focal abnormality. Adrenals/Urinary Tract: No adrenal nodules. No suspicious renal masses. No hydronephrosis. Multiple punctate nonobstructing left upper pole renal stones. No focal bladder wall thickening. Stomach/Bowel: Normal appearance of the stomach. Lower quadrant ostomy. No evidence of bowel wall thickening, distention, or inflammatory changes. Appendix is not discretely seen. Vascular/Lymphatic: Aortic atherosclerosis. No enlarged abdominal or pelvic lymph nodes. Reproductive: No adnexal masses. Other: Small volume presacral free fluid.  No fluid collection. Musculoskeletal: Diffuse body wall edema. Interval development of large sacral decubitus ulcer with cortical destruction of the underlying distal sacrococcygeal spine and foci of gas extending into the presacral region. Multilevel degenerative changes of the T9 butterfly vertebra. Spine. IMPRESSION: 1. Interval development of large sacral decubitus ulcer with cortical destruction of the underlying distal sacrococcygeal spine and foci of gas extending into the presacral region, consistent with osteomyelitis. 2. A 1.1 x 0.6 cm hypodensity in the pancreatic neck, not well seen on prior examinations in the absence of contrast enhancement. 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 Recommend further evaluation with nonemergent  contrast-enhanced MRI/MRCP. 3. Cholelithiasis. 4. Multiple punctate nonobstructing left upper pole renal stones. 5.  Aortic Atherosclerosis (ICD10-I70.0). Electronically Signed   By: Limin  Xu M.D.   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 person living with a history of HTN, CAD, a-fib, diabetes mellitus, ESRD on HD, Diabetic neuropathy, HLD, and diabetic retinopathy who presented with right lower extremity wound dehiscence and admitted for stage IV sacral decubitus ulcer on hospital day 1  #Stage IV sacral decubitus ulcer #Hx of MRSA/Strep B bacteremia #Frequent Hospitalizations -The patient was recently hospitalized in July for septic shock secondary to necrotizing fasciitis and MRSA/group B streptococcal bacteremia.  At this time she underwent a BKA and was discharged to SNF and then to home. The patient reports poor mobility having to rely on her son to pick her up and carry her from one room to the other due to the layout of her house.   -She reports that she obtained a sacral ulcer while she was hospitalized. She also states that she was discharged with a specific cream that she was post to use however her insurance did not cover this.  In the time since her discharge she has been using antibacterial ointment and dressing it once a day. -Upon arrival to the ED it was noted that had a very large  sacral wound and general surgery was  contacted. -The patient currently does not have signs or symptoms of systemic infection including constitutional symptoms or fever.  Mild leukocytosis noted at 12.4 however no concerns at this time for sepsis or hemodynamic instability. -Per general surgery documentation they were able to probe to the bone.  A bedside debridement was performed with a bone fragment taken and sent for biopsy. CTAP ordered and pending for further classification of depth of wound.  - Infectious disease was consulted who recommended holding off on starting antibiotics until cultures can be obtained. Cultures were successfully sent from the ED.    -The patient was discharged on 8/5 on IV vancomycin  for MRSA bacteremia coverage by infectious disease which she has been taking with dialysis sessions. She was instructed to continue this until 06/15/2024 for a total of an 8-week course. Will continue here. -Continue wound care -q2 turns -Will obtain air mattress -RD consult to ensure proper nutrition to optimize wound healing.   S/p R BKA with wound dehiscence -The patient presented today with wound dehiscence of her right BKA incision site. Her incision had been previously healing well, however she hit it this morning when her son was carrying her and noticed her skin hanging down with bloody tissue. -This was repaired with staples and a dressing in th ED -Currently no signs of infection -Continue with wound care  ESRD on HD -The patient has a long history of CKD however since being hospitalized for diverticulitis in February she has required dialysis. Of note, her TCD was not functioning properly and she was also found to have a right internal jugular DVT.  At this time it was replaced (04/23/2024) -Last dialysis session was Wednesday.  Patient unable to make today's session due to presented to the ER. Nephrology consulted and following.  Will plan for HD tomorrow  Normocytic Anemia #History of iron  deficiency  anemia #Anemia of chronic disease -The patient has a long history of IDA requiring iron  supplementation in the past. -Hemoglobin on admission was 8.5 however patient currently asymptomatic -The patient also has a long history of chronic kidney disease likely contributing. -Will obtain iron  labs and replete if necessary  R internal jugular DVT - Last hospitalization the patient was found to have a right internal jugular DVT.  At this time her [facility] was removed and replaced. Continue Eliquis  5 mg twice daily  Hx of Diverticulitis S/p partial colectomy and end colostomy 10/2023 -Pt underwent exploratory laparotomy in February this year with subsequent colostomy formation. She is following with surgery outpatient.  -Colostomy in place and secure without signs of surrounding infection -Will contact wound care for assistance while inpatient.   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 -Patient takes Lantus  7 units daily however will hold at this time for concerns of hypoglycemia   Best practice: Diet: Normal VTE: Eliquis   IVF: None,None Code: DNR  Disposition planning: Prior to Admission Living Arrangement: Home, living with sons Anticipated Discharge Location: SNF  Dispo: Admit patient to Inpatient with expected length of stay greater than 2 midnights.  Signed: Dr. Schuyler Novak Internal Medicine Resident  05/30/2024, 12:12 PM  On Call pager: [phone] //

## 2024-05-29 NOTE — ED Provider Notes (Addendum)
 Discussed with Dr. Geralynn who will place the patient on the list for dialysis.   Connie Lamar BROCKS, MD 05/29/24 MEDIA    Connie Lamar BROCKS, MD 05/29/24 1210

## 2024-05-29 NOTE — Telephone Encounter (Signed)
 Patient left message on triage phone c/o bleeding from incision. I called her back and left her a message asking if she could come in to get this looked at this morning. If she calls back she may come on in

## 2024-05-29 NOTE — Telephone Encounter (Signed)
 FYI---Patient called back stating she was at her doctors office and they are actually going to send her to the hospital. She said her tissue was coming out of her incision

## 2024-05-29 NOTE — Discharge Instructions (Addendum)
 Follow-up with your orthopedic doctor in 1 to 2 weeks to have your staples removed.  Thank you for allowing us  to be part of your care. You were hospitalized for a sacral wound. We treated you with wound care and antibiotics.  See the changes in your medications and management of your chronic conditions below:  *For your wound: -We have STARTED you on these following antibiotics:  -Linezolid --take 1 tablet every 12 hours until 10/16  -Ceftazidime --This will be given after dialysis on MWF over the next 6 weeks  -It is important that you keep pressure off of the area.   *For your Wound care:  -We have STARTED you on these following medications: ON THE SACRUM: NS WD dressing changes with Kerlix BID, place santyl  on necrotic tissue prior to placing dressing once a day   -If santyl  unavailable at nursing facility, use medihoney, which we have sent a prescription for  ON THE RIGHT LEG Apply 4 x 4 gauze dampened with Vashe to the right below-knee amputation incision plus Ace wrap and a stump shrinker once per day   *For your Pain:  -Tylenol : take 1000mg  every 6 hours--this works best when you take it around the clock.   -Hydromorphone : Take 1mg  up to every 6 hours as needed for pain  -Please see your orthopedist in 7 to 10 days  FOLLOW UP APPOINTMENTS: Please call your PCP to schedule a follow up with your family doctor: [phone]   Please make sure to take your antibiotics and keep pressure off of your bottom.   Please call your PCP or our clinic if you have any questions or concerns, we may be able to help and keep you from a long and expensive emergency room wait. Our clinic and after hours phone number is [phone]. The best time to call is Monday through Friday 9 am to 4 pm but there is always someone available 24/7 if you have an emergency. If you need medication refills please notify your pharmacy one week in advance and they will send us  a request.   We are glad you are  feeling better,  Schuyler Novak Internal Medicine Inpatient Teaching Service at [facility]

## 2024-05-29 NOTE — Care Management (Signed)
 Transition of Care [facility]) - Emergency Department Mini Assessment   Patient Details  Name: [name] MRN: [id number] Date of Birth: Jan 30, 1956  Transition of [facility]) CM/SW Contact:    Corean JAYSON Canary, RN Phone Number: 05/29/2024, 3:55 PM   Clinical Narrative:    ED Mini Assessment:    Patient presented for wound dehiscence to BKA. Wound care saw her and  recommended dressings. She was recently in [facility] for rehab, just released at the end of August. She is active with [facility], spoke to their rep [name]. . She is active for PT RN and aide.  He family is currently having to lift her upstairs as she does not have a stair lift. Would like a hospital bed and electric wheelchair. She has a manual wheelchair at home.   Discussed with team, the patent had a extensive debridement today, will likely need admit.  Arna is aware that orders will be in  for home health start when she discharges.         Interventions which prevented an admission or readmission: Home Health Consult or Services (DME review)    Patient Contact and Communications        ,                 Admission diagnosis:  Wound Dehiscence Patient Active Problem List   Diagnosis Date Noted   Osteomyelitis (HCC) 04/20/2024   MRSA bacteremia 04/20/2024   Bacteremia 04/20/2024   Necrotizing fasciitis (HCC) 04/20/2024   ESRD (end stage renal disease) (HCC) 04/20/2024   Septic shock (HCC) 04/19/2024   Irritant contact dermatitis associated with fecal stoma 12/27/2023   Colostomy complication, unspecified (HCC) 12/27/2023   Diverticulitis of large intestine with complication 10/27/2023   Chronic kidney disease, stage 5 (HCC) 10/27/2023   Chronic nonintractable headache 03/03/2021   Foot callus 02/27/2021   Counseled about COVID-19 virus infection 02/24/2020   Aortic heart murmur 05/15/2019   Thyroid  enlargement 07/19/2014   Vitamin D  deficiency 09/03/2013   Hyperlipidemia 04/24/2013    Malunion of fracture 03/17/2013   Wound infection after surgery 03/17/2013   Diabetic retinopathy (HCC) 01/23/2013   Chronic atrial fibrillation (HCC) 01/15/2013   CAD (coronary artery disease) 01/11/2013   H/O non-ST elevation myocardial infarction (NSTEMI) 01/09/2013   History of stroke 01/08/2013   Diabetic neuropathy 01/08/2013   Hypertension    Diabetes mellitus    Chronic kidney disease (CKD), stage IV (severe) (HCC)    PCP:  Campbell Reynolds, NP Pharmacy:   [facility] - [place], [place] - [address] [place] [address] Phone: [phone] Fax: [phone]  [facility] [address] [place]), [place] - [address] [place] [address] Phone: [phone] Fax: [phone]  [id number] - [place], [place] - [address] [place] [place] [address] Phone: [phone] Fax: [phone]

## 2024-05-29 NOTE — Evaluation (Signed)
 Physical Therapy Evaluation Patient Details Name: [name] MRN: [id number] DOB: Oct 05, 1955 Today's Date: 05/29/2024  History of Present Illness  68 y.o. female presentsd to [facility] hospital on 05/29/2024 with R BKA wound dehiscence.PMHx: ESRD on HD, colostomy, T2DM, Afib, Rt ankle ORIF, CAD  Clinical Impression  Pt presents to PT with deficits in activity tolerance, strength, power, gait, balance. Pt is able to transfer from taller surfaces at this time but is unable to hop on LLE due to weakness and instability. Pt reports recent difficulty with stair negotiation, stating that her sons have had to drag her up and down the stairs at home since discharging after R BKA. Pt will benefit from a hospital bed as this will allow her to stay on the 1st level of the home and she will not need to negotiate stairs. Having a power wheelchair would also allow the pt greater independence in household and community mobility until she is able to receive a prosthetic and initiate gait training. HHPT recommended at the time of discharge.        If plan is discharge home, recommend the following: A little help with walking and/or transfers;A little help with bathing/dressing/bathroom;Assistance with cooking/housework;Assist for transportation;Help with stairs or ramp for entrance   Can travel by private vehicle        Equipment Recommendations Hospital bed;Other (comment) (power wheelchair (rental until able to get prosthetic))  Recommendations for Other Services       Functional Status Assessment Patient has had a recent decline in their functional status and demonstrates the ability to make significant improvements in function in a reasonable and predictable amount of time.     Precautions / Restrictions Precautions Precautions: Fall Recall of Precautions/Restrictions: Intact Precaution/Restrictions Comments: R BKA Restrictions Weight Bearing Restrictions Per Provider Order: Yes RLE Weight Bearing Per  Provider Order: Non weight bearing      Mobility  Bed Mobility Overal bed mobility: Needs Assistance Bed Mobility: Rolling, Sidelying to Sit, Sit to Supine Rolling: Supervision Sidelying to sit: Min assist   Sit to supine: Supervision        Transfers Overall transfer level: Needs assistance Equipment used: Rolling walker (2 wheels) Transfers: Sit to/from Stand Sit to Stand: Contact guard assist                Ambulation/Gait             Pre-gait activities: minA for one attempted hop to left side, minimal to no foot clearance noted    Stairs            Wheelchair Mobility     Tilt Bed    Modified Rankin (Stroke Patients Only)       Balance Overall balance assessment: Needs assistance Sitting-balance support: No upper extremity supported, Feet supported Sitting balance-Leahy Scale: Good     Standing balance support: Bilateral upper extremity supported, Reliant on assistive device for balance Standing balance-Leahy Scale: Poor                               Pertinent Vitals/Pain Pain Assessment Pain Assessment: Faces Faces Pain Scale: Hurts little more Pain Location: R BKA Pain Descriptors / Indicators: Sore Pain Intervention(s): Monitored during session    Home Living Family/patient expects to be discharged to:: Private residence Living Arrangements: Children Available Help at Discharge: Family;Available 24 hours/day Type of Home: House Home Access: Level entry     Alternate Level Stairs-Number of  Steps: 5 Home Layout: Multi-level Home Equipment: Cane - single point;Wheelchair - Forensic psychologist (2 wheels);Shower seat;BSC/3in1      Prior Function Prior Level of Function : Needs assist             Mobility Comments: pt has been requiring assistance for transfers, wheelchair mobility, and reports that her sons have been having to drag her up/down the steps to get to her bedroom ADLs Comments: independent  prior to R BKA     Extremity/Trunk Assessment   Upper Extremity Assessment Upper Extremity Assessment: Overall WFL for tasks assessed    Lower Extremity Assessment Lower Extremity Assessment: LLE deficits/detail;RLE deficits/detail RLE Deficits / Details: ROM WFL, R residual limb dressed with dried blood noted but no fresh spots observed LLE Deficits / Details: ROM WFL, generalized weakness    Cervical / Trunk Assessment Cervical / Trunk Assessment: Normal  Communication   Communication Communication: No apparent difficulties    Cognition Arousal: Alert Behavior During Therapy: WFL for tasks assessed/performed   PT - Cognitive impairments: No apparent impairments                         Following commands: Intact       Cueing Cueing Techniques: Verbal cues     General Comments General comments (skin integrity, edema, etc.): VSS on RA    Exercises     Assessment/Plan    PT Assessment Patient needs continued PT services  PT Problem List Decreased strength;Decreased activity tolerance;Decreased balance;Decreased mobility;Decreased knowledge of use of DME;Pain       PT Treatment Interventions DME instruction;Gait training;Functional mobility training;Therapeutic activities;Therapeutic exercise;Balance training;Neuromuscular re-education;Patient/family education;Wheelchair mobility training    PT Goals (Current goals can be found in the Care Plan section)  Acute Rehab PT Goals Patient Stated Goal: to be able to live downstairs and not have to negotiate stairs until receiving her prosthetic PT Goal Formulation: With patient Time For Goal Achievement: 06/12/24 Potential to Achieve Goals: Fair Additional Goals Additional Goal #1: pt will mobilize in a manual wheelchair for 50' with use of BUE and RLE at a modI level to demonstrate independence within the home setting    Frequency Min 2X/week     Co-evaluation               AM-PAC PT 6 Clicks  Mobility  Outcome Measure Help needed turning from your back to your side while in a flat bed without using bedrails?: A Little Help needed moving from lying on your back to sitting on the side of a flat bed without using bedrails?: A Little Help needed moving to and from a bed to a chair (including a wheelchair)?: A Little Help needed standing up from a chair using your arms (e.g., wheelchair or bedside chair)?: A Little Help needed to walk in hospital room?: Total Help needed climbing 3-5 steps with a railing? : Total 6 Click Score: 14    End of Session Equipment Utilized During Treatment: Gait belt Activity Tolerance: Patient tolerated treatment well Patient left: in bed Nurse Communication: Mobility status PT Visit Diagnosis: Other abnormalities of gait and mobility (R26.89);Muscle weakness (generalized) (M62.81)    Time: 8698-8679 PT Time Calculation (min) (ACUTE ONLY): 19 min   Charges:   PT Evaluation $PT Eval Low Complexity: 1 Low   PT General Charges $$ ACUTE PT VISIT: 1 Visit         Bernardino JINNY Ruth, PT, DPT Acute Rehabilitation Office [phone]  Bernardino JINNY Ruth 05/29/2024, 1:41 PM

## 2024-05-30 ENCOUNTER — Encounter (HOSPITAL_COMMUNITY): Payer: Self-pay | Admitting: Internal Medicine

## 2024-05-30 DIAGNOSIS — E11622 Type 2 diabetes mellitus with other skin ulcer: Secondary | ICD-10-CM

## 2024-05-30 DIAGNOSIS — M4628 Osteomyelitis of vertebra, sacral and sacrococcygeal region: Secondary | ICD-10-CM

## 2024-05-30 DIAGNOSIS — D631 Anemia in chronic kidney disease: Secondary | ICD-10-CM

## 2024-05-30 DIAGNOSIS — E1122 Type 2 diabetes mellitus with diabetic chronic kidney disease: Secondary | ICD-10-CM

## 2024-05-30 DIAGNOSIS — Z992 Dependence on renal dialysis: Secondary | ICD-10-CM

## 2024-05-30 DIAGNOSIS — Z86718 Personal history of other venous thrombosis and embolism: Secondary | ICD-10-CM

## 2024-05-30 DIAGNOSIS — T8130XA Disruption of wound, unspecified, initial encounter: Secondary | ICD-10-CM

## 2024-05-30 DIAGNOSIS — Z89511 Acquired absence of right leg below knee: Secondary | ICD-10-CM

## 2024-05-30 DIAGNOSIS — E11628 Type 2 diabetes mellitus with other skin complications: Secondary | ICD-10-CM

## 2024-05-30 DIAGNOSIS — Z8614 Personal history of Methicillin resistant Staphylococcus aureus infection: Secondary | ICD-10-CM | POA: Diagnosis not present

## 2024-05-30 DIAGNOSIS — L98429 Non-pressure chronic ulcer of back with unspecified severity: Secondary | ICD-10-CM | POA: Diagnosis present

## 2024-05-30 DIAGNOSIS — L89154 Pressure ulcer of sacral region, stage 4: Secondary | ICD-10-CM

## 2024-05-30 DIAGNOSIS — N186 End stage renal disease: Secondary | ICD-10-CM

## 2024-05-30 DIAGNOSIS — E8809 Other disorders of plasma-protein metabolism, not elsewhere classified: Secondary | ICD-10-CM | POA: Diagnosis present

## 2024-05-30 DIAGNOSIS — A4902 Methicillin resistant Staphylococcus aureus infection, unspecified site: Secondary | ICD-10-CM

## 2024-05-30 DIAGNOSIS — T8781 Dehiscence of amputation stump: Secondary | ICD-10-CM

## 2024-05-30 LAB — GLUCOSE, CAPILLARY: Glucose-Capillary: 166 mg/dL — ABNORMAL HIGH (ref 70–99)

## 2024-05-30 MED ORDER — HEPARIN SODIUM (PORCINE) 1000 UNIT/ML DIALYSIS: 2000.0000 [IU] | Freq: Once | INTRAMUSCULAR | Status: DC

## 2024-05-30 MED ORDER — ACETAMINOPHEN 500 MG PO TABS
1000.0000 mg | ORAL_TABLET | Freq: Four times a day (QID) | ORAL | Status: DC
  Administered 2024-05-30 – 2024-06-04 (×12): 1000 mg via ORAL
  Filled 2024-05-30 (×17): qty 2

## 2024-05-30 MED ORDER — VANCOMYCIN HCL 750 MG/150ML IV SOLN: 750.0000 mg | Freq: Once | INTRAVENOUS | Status: DC

## 2024-05-30 MED ORDER — ANTICOAGULANT SODIUM CITRATE 4% (200MG/5ML) IV SOLN: 5.0000 mL | Status: DC | PRN

## 2024-05-30 MED ORDER — HEPARIN SODIUM (PORCINE) 1000 UNIT/ML DIALYSIS: 1000.0000 [IU] | INTRAMUSCULAR | Status: DC | PRN

## 2024-05-30 MED ORDER — HYDROMORPHONE HCL 2 MG PO TABS
1.0000 mg | ORAL_TABLET | Freq: Four times a day (QID) | ORAL | Status: AC
  Administered 2024-05-30 – 2024-05-31 (×5): 1 mg via ORAL
  Filled 2024-05-30 (×5): qty 1

## 2024-05-30 MED ORDER — HYDROMORPHONE HCL 2 MG PO TABS: 2.0000 mg | ORAL_TABLET | Freq: Four times a day (QID) | ORAL | Status: DC

## 2024-05-30 MED ORDER — COLLAGENASE 250 UNIT/GM EX OINT
TOPICAL_OINTMENT | Freq: Two times a day (BID) | CUTANEOUS | Status: DC
  Administered 2024-05-31 – 2024-06-08 (×2): 1 via TOPICAL
  Filled 2024-05-30: qty 30

## 2024-05-30 MED ORDER — HEPARIN SODIUM (PORCINE) 1000 UNIT/ML IJ SOLN
INTRAMUSCULAR | Status: AC
  Filled 2024-05-30: qty 5

## 2024-05-30 MED ORDER — VANCOMYCIN HCL 750 MG/150ML IV SOLN
750.0000 mg | Freq: Once | INTRAVENOUS | Status: AC
  Administered 2024-05-30: 750 mg via INTRAVENOUS
  Filled 2024-05-30: qty 150

## 2024-05-30 MED ORDER — ALTEPLASE 2 MG IJ SOLR: 2.0000 mg | Freq: Once | INTRAMUSCULAR | Status: DC | PRN

## 2024-05-30 MED ORDER — NEPRO/CARBSTEADY PO LIQD: 237.0000 mL | ORAL | Status: DC | PRN

## 2024-05-30 MED ORDER — POVIDONE-IODINE 10 % EX SWAB
2.0000 | Freq: Once | CUTANEOUS | Status: AC
  Administered 2024-05-30: 2 via TOPICAL

## 2024-05-30 MED ORDER — CHLORHEXIDINE GLUCONATE 4 % EX SOLN
60.0000 mL | Freq: Once | CUTANEOUS | Status: AC
  Administered 2024-05-30: 4 via TOPICAL
  Filled 2024-05-30: qty 60

## 2024-05-30 MED ORDER — CEFAZOLIN SODIUM-DEXTROSE 2-4 GM/100ML-% IV SOLN: 2.0000 g | INTRAVENOUS | Status: DC

## 2024-05-30 MED ORDER — PIPERACILLIN-TAZOBACTAM IN DEX 2-0.25 GM/50ML IV SOLN
2.2500 g | Freq: Three times a day (TID) | INTRAVENOUS | Status: DC
  Administered 2024-05-30 – 2024-06-01 (×6): 2.25 g via INTRAVENOUS
  Filled 2024-05-30 (×7): qty 50

## 2024-05-30 NOTE — Progress Notes (Signed)
 Orthopedic Tech Progress Note Patient Details:  [name] 06/16/1956 [id number] 3 right BK shrinkers have been ordered from [facility].  Patient ID: [name], female   DOB: [birth year], 68 y.o.   MRN: [id number]  Massie FORBES Bar 05/30/2024, 10:29 AM

## 2024-05-30 NOTE — Consult Note (Signed)
 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 She previously in July had MRSA bacteremia from foot infection with neg TTE, neg MRI but plan for IV vanco at HD through 9/22/ On admit no fever, wbc 12, crp 4, esr 17 bcx done. CT with deep decub with cortical destruction of the underlying distal sacrococcygeal spine and foci of gas extending into the presacral region, consistent with osteomyelitis.  S/p bedside debridement to bone by surgery with cx from bone sent. Gram stain showed GNR. Cx pending AAF since admit   Past Medical History:  Diagnosis Date   Anemia    Arthritis    Atrial fibrillation (HCC)    CAD (coronary artery disease)    Chronic kidney disease, stage III (moderate) (HCC)    Constipation    Diabetes mellitus (HCC)    ESRD on dialysis (HCC) 04/20/2024   History of blood transfusion    HLD (hyperlipidemia)    Hypertension    Myocardial infarction [facility])    in April 2014   Septic shock (HCC) 04/19/2024   Wound infection after surgery 03/17/2013   Past Surgical History:  Procedure  Laterality Date   AMPUTATION Right 04/20/2024   Procedure: AMPUTATION BELOW KNEE;  Surgeon: Harden Jerona GAILS, MD;  Location[facility] OR;  Service: Orthopedics;  Laterality: Right;   ANKLE SURGERY Right    x 6 total   BREAST BIOPSY Left    15 or 20 yearsa ago she cant remember laterality    HARDWARE REMOVAL Right 02/13/2013   Procedure: HARDWARE REMOVAL;  Surgeon: Jerona GAILS Harden, MD;  Location: [facility] OR;  Service: Orthopedics;  Laterality: Right;  Right Ankle Removal Hardware, Debridement, Place Wound VAC   IR FLUORO GUIDE CV LINE LEFT  11/08/2023   IR FLUORO GUIDE CV LINE LEFT  04/23/2024   IR FLUORO GUIDE CV LINE RIGHT  11/01/2023   IR FLUORO GUIDE CV LINE RIGHT  11/12/2023   IR REMOVAL TUN CV CATH W/O FL  04/21/2024   IR US  GUIDE VASC ACCESS LEFT  11/08/2023   IR US  GUIDE VASC ACCESS LEFT  04/23/2024   IR US  GUIDE VASC ACCESS RIGHT  11/01/2023   IR US  GUIDE VASC ACCESS RIGHT  11/12/2023   laser surgery for diabetic retinopathy Bilateral    LEFT HEART CATHETERIZATION WITH CORONARY ANGIOGRAM N/A 01/09/2013   Procedure: LEFT HEART CATHETERIZATION WITH CORONARY ANGIOGRAM;  Surgeon: Toribio JONELLE Fuel, MD;  Location: [facility] CATH LAB;  Service: Cardiovascular;  Laterality: N/A;   ORIF ANKLE FRACTURE Right 01/12/2013   Procedure: OPEN REDUCTION INTERNAL FIXATION (ORIF) ANKLE FRACTURE;  Surgeon: Jerona  LULLA Sage, MD;  Location: [facility] OR;  Service: Orthopedics;  Laterality: Right;   PARTIAL COLECTOMY N/A 11/02/2023   Procedure: OPEN PARTIAL COLECTOMY WITH END COLOSTOMY;  Surgeon: Dasie Leonor CROME, MD;  Location: [facility] OR;  Service: General;  Laterality: N/A;   PARTIAL HYSTERECTOMY     TUNNELLED CATHETER EXCHANGE N/A 04/15/2024   Procedure: TUNNELLED CATHETER EXCHANGE;  Surgeon: Pearline Norman RAMAN, MD;  Location: [facility] LAB;  Service: Cardiovascular;  Laterality: N/A;   Social History   Tobacco Use   Smoking status: Never   Smokeless tobacco: Never  Vaping Use   Vaping status: Never Used  Substance Use Topics   Alcohol  use: No    Drug use: No   Family History  Problem Relation Age of Onset   Diabetes Mother        No history CAD   Colon cancer Mother 42       died at 86 from CRC   Hypertension Father        Also had CAD   Heart disease Father    Cervical cancer Sister    Diabetes Sister    Congestive Heart Failure Sister    Heart disease Maternal Grandmother    Diabetes Brother    Kidney disease Brother    Sickle cell anemia Son    Thyroid  disease Son    Breast cancer Neg Hx    BRCA 1/2 Neg Hx     Allergies:  Allergies  Allergen Reactions   Nsaids Other (See Comments)    CKD stage 4   Lisinopril Other (See Comments)    coughing   Peanut-Containing Drug Products Itching and Other (See Comments)    GI intolerance - diarrhea    Current antibiotics: Antibiotics Given (last 72 hours)     None       MEDICATIONS:  acetaminophen   1,000 mg Oral Q6H   apixaban   5 mg Oral BID   brimonidine   1 drop Both Eyes BID   carvedilol   12.5 mg Oral BID WC   Chlorhexidine  Gluconate Cloth  6 each Topical Q0600   collagenase    Topical Daily   HYDROmorphone   1 mg Oral Q6H   latanoprost   1 drop Both Eyes QHS   vancomycin  variable dose per unstable renal function (pharmacist dosing)   Does not apply See admin instructions    Review of Systems - 11 systems reviewed and negative per HPI   OBJECTIVE: Temp:  [97.5 F (36.4 C)-98.4 F (36.9 C)] 98.4 F (36.9 C) (09/06 1130) Pulse Rate:  [59-75] 74 (09/06 1130) Resp:  [6-18] 17 (09/06 1130) BP: (108-156)/(53-96) 145/84 (09/06 1130) SpO2:  [96 %-100 %] 99 % (09/06 1130) Weight:  [70.4 kg-71.4 kg] 70.6 kg (09/06 1130) Physical Exam  Constitutional:  oriented to person, place, and time. appears well-developed and well-nourished. No distress.  HENT: [place]/AT, PERRLA, no scleral icterus Mouth/Throat: Oropharynx is clear and moist..  Cardiovascular: Normal rate, regular rhythm and normal heart sounds.  Pulmonary/Chest: Effort normal and breath sounds normal.  No respiratory distress.  has no wheezes.  HD cath in place Neck = supple, no nuchal rigidity Abdominal: Soft. Bowel sounds are normal.  exhibits no distension. There is no tenderness.  Lymphadenopathy: no cervical adenopathy. No axillary adenopathy Neurological: alert and oriented to person, place, and time.  Wound     LABS: Results for orders placed or performed during the hospital encounter of 05/29/24 (from the past 48 hours)  CBC with Differential  Status: Abnormal   Collection Time: 05/29/24 10:08 AM  Result Value Ref Range   WBC 12.4 (H) 4.0 - 10.5 K/uL   RBC 3.42 (L) 3.87 - 5.11 MIL/uL   Hemoglobin 8.5 (L) 12.0 - 15.0 g/dL   HCT 70.1 (L) 63.9 - 53.9 %   MCV 87.1 80.0 - 100.0 fL   MCH 24.9 (L) 26.0 - 34.0 pg   MCHC 28.5 (L) 30.0 - 36.0 g/dL   RDW 79.2 (H) 88.4 - 84.4 %   Platelets 293 150 - 400 K/uL   nRBC 0.0 0.0 - 0.2 %   Neutrophils Relative % 73 %   Neutro Abs 9.1 (H) 1.7 - 7.7 K/uL   Lymphocytes Relative 12 %   Lymphs Abs 1.5 0.7 - 4.0 K/uL   Monocytes Relative 12 %   Monocytes Absolute 1.4 (H) 0.1 - 1.0 K/uL   Eosinophils Relative 0 %   Eosinophils Absolute 0.0 0.0 - 0.5 K/uL   Basophils Relative 1 %   Basophils Absolute 0.1 0.0 - 0.1 K/uL   Immature Granulocytes 2 %   Abs Immature Granulocytes 0.18 (H) 0.00 - 0.07 K/uL    Comment: Performed at [facility] Lab, [address] [place], [place] [address]  Basic metabolic panel     Status: Abnormal   Collection Time: 05/29/24 10:08 AM  Result Value Ref Range   Sodium 141 135 - 145 mmol/L   Potassium 3.5 3.5 - 5.1 mmol/L   Chloride 105 98 - 111 mmol/L   CO2 24 22 - 32 mmol/L   Glucose, Bld 165 (H) 70 - 99 mg/dL    Comment: Glucose reference range applies only to samples taken after fasting for at least 8 hours.   BUN 13 8 - 23 mg/dL   Creatinine, Ser 5.52 (H) 0.44 - 1.00 mg/dL   Calcium  8.4 (L) 8.9 - 10.3 mg/dL   GFR, Estimated 10 (L) >60 mL/min    Comment: (NOTE) Calculated using the CKD-EPI  Creatinine Equation (2021)    Anion gap 12 5 - 15    Comment: Performed at [facility] Lab, [address], [place], [place] [address]  Hemoglobin A1c     Status: None   Collection Time: 05/29/24  4:20 PM  Result Value Ref Range   Hgb A1c MFr Bld 5.1 4.8 - 5.6 %    Comment: (NOTE) Diagnosis of Diabetes The following HbA1c ranges recommended by the American Diabetes Association (ADA) may be used as an aid in the diagnosis of diabetes mellitus.  Hemoglobin             Suggested A1C NGSP%              Diagnosis  <5.7                   Non Diabetic  5.7-6.4                Pre-Diabetic  >6.4                   Diabetic  <7.0                   Glycemic control for                       adults with diabetes.     Mean Plasma Glucose 99.67 mg/dL    Comment: Performed at [facility] Lab, [address] [place], [place] [address]  Sedimentation rate     Status: None   Collection Time: 05/29/24  4:20 PM  Result Value Ref Range   Sed Rate 17 0 - 22 mm/hr    Comment: Performed at [facility] Lab, [address] [place], [place] [address]  C-reactive protein     Status: Abnormal   Collection Time: 05/29/24  4:20 PM  Result Value Ref Range   CRP 4.1 (H) <1.0 mg/dL    Comment: Performed at [facility] Lab, [address] [place], [place] [address]  Blood culture (routine x 2)     Status: None (Preliminary result)   Collection Time: 05/29/24  4:20 PM   Specimen: BLOOD  Result Value Ref Range   Specimen Description BLOOD SITE NOT SPECIFIED    Special Requests      BOTTLES DRAWN AEROBIC AND ANAEROBIC Blood Culture results may not be optimal due to an inadequate volume of blood received in culture bottles   Culture      NO GROWTH < 24 HOURS Performed at [facility], [address], [place], [place] [address]    Report Status PENDING   Blood culture (routine x 2)     Status: None (Preliminary result)   Collection Time: 05/29/24  4:29 PM   Specimen: BLOOD  Result  Value Ref Range   Specimen Description BLOOD SITE NOT SPECIFIED    Special Requests      BOTTLES DRAWN AEROBIC ONLY Blood Culture adequate volume   Culture      NO GROWTH < 24 HOURS Performed at [facility] Lab, [address] [place], [place] [address]    Report Status PENDING   CBG monitoring, ED     Status: Abnormal   Collection Time: 05/29/24  5:09 PM  Result Value Ref Range   Glucose-Capillary 122 (H) 70 - 99 mg/dL    Comment: Glucose reference range applies only to samples taken after fasting for at least 8 hours.  Aerobic/Anaerobic Culture w Gram Stain (surgical/deep wound)     Status: None (Preliminary result)   Collection Time: 05/29/24  7:04 PM   Specimen: Bone  Result Value Ref Range   Specimen Description BONE    Special Requests NONE    Gram Stain      RARE WBC PRESENT, PREDOMINANTLY PMN NO ORGANISMS SEEN Performed at [facility] Lab, [address], [place], [place] [address]    Culture PENDING    Report Status PENDING   Renal function panel     Status: Abnormal   Collection Time: 05/29/24  7:07 PM  Result Value Ref Range   Sodium 139 135 - 145 mmol/L   Potassium 3.4 (L) 3.5 - 5.1 mmol/L   Chloride 103 98 - 111 mmol/L   CO2 26 22 - 32 mmol/L   Glucose, Bld 123 (H) 70 - 99 mg/dL    Comment: Glucose reference range applies only to samples taken after fasting for at least 8 hours.   BUN 14 8 - 23 mg/dL   Creatinine, Ser 5.31 (H) 0.44 - 1.00 mg/dL   Calcium  8.2 (L) 8.9 - 10.3 mg/dL   Phosphorus 2.9 2.5 - 4.6 mg/dL   Albumin  1.9 (L) 3.5 - 5.0 g/dL   GFR, Estimated 10 (L) >60 mL/min    Comment: (NOTE) Calculated using the CKD-EPI Creatinine Equation (2021)    Anion gap 10 5 - 15    Comment: Performed at [facility] Lab, [address] [place],  [place] [address]  CBC with Differential/Platelet     Status: Abnormal   Collection Time: 05/29/24  7:07 PM  Result Value Ref Range   WBC 10.7 (H) 4.0 - 10.5 K/uL   RBC 3.43 (L) 3.87 - 5.11 MIL/uL    Hemoglobin 8.7 (L) 12.0 - 15.0 g/dL   HCT 70.1 (L) 63.9 - 53.9 %   MCV 86.9 80.0 - 100.0 fL   MCH 25.4 (L) 26.0 - 34.0 pg   MCHC 29.2 (L) 30.0 - 36.0 g/dL   RDW 79.2 (H) 88.4 - 84.4 %   Platelets 287 150 - 400 K/uL   nRBC 0.0 0.0 - 0.2 %   Neutrophils Relative % 82 %   Neutro Abs 8.7 (H) 1.7 - 7.7 K/uL   Lymphocytes Relative 7 %   Lymphs Abs 0.8 0.7 - 4.0 K/uL   Monocytes Relative 9 %   Monocytes Absolute 1.0 0.1 - 1.0 K/uL   Eosinophils Relative 0 %   Eosinophils Absolute 0.0 0.0 - 0.5 K/uL   Basophils Relative 1 %   Basophils Absolute 0.1 0.0 - 0.1 K/uL   Immature Granulocytes 1 %   Abs Immature Granulocytes 0.15 (H) 0.00 - 0.07 K/uL    Comment: Performed at [facility] Lab, [address], [place], [place] [address]  Iron  and TIBC     Status: Abnormal   Collection Time: 05/29/24  7:07 PM  Result Value Ref Range   Iron  14 (L) 28 - 170 ug/dL   TIBC NOT CALCULATED 749 - 450 ug/dL   Saturation Ratios NOT CALCULATED 10.4 - 31.8 %   UIBC NOT CALCULATED ug/dL    Comment: Performed at [facility] Lab, [address] [place], [place] [address]  Ferritin     Status: Abnormal   Collection Time: 05/29/24  7:07 PM  Result Value Ref Range   Ferritin 720 (H) 11 - 307 ng/mL    Comment: Performed at [facility] Lab, [address], [place], [place] [address]  Magnesium      Status: None   Collection Time: 05/29/24  7:07 PM  Result Value Ref Range   Magnesium  1.9 1.7 - 2.4 mg/dL    Comment: Performed at [facility] Lab, [address] [place], [place] [address]  Phosphorus     Status: None   Collection Time: 05/29/24  7:07 PM  Result Value Ref Range   Phosphorus 2.8 2.5 - 4.6 mg/dL    Comment: Performed at [facility] Lab, [address] [place], [place] [address]  Vancomycin , random     Status: None   Collection Time: 05/29/24  7:07 PM  Result Value Ref Range   Vancomycin  Rm 20 ug/mL    Comment:        Random Vancomycin  therapeutic range is dependent on dosage  and time of specimen collection. A peak range is 20-40 ug/mL A trough range is 5-15 ug/mL        Performed at [facility] Lab, [address] [place], [place] [address]   Glucose, capillary     Status: Abnormal   Collection Time: 05/29/24 10:28 PM  Result Value Ref Range   Glucose-Capillary 139 (H) 70 - 99 mg/dL    Comment: Glucose reference range applies only to samples taken after fasting for at least 8 hours.   No components found for: ESR, C REACTIVE PROTEIN MICRO: Recent Results (from the past 720 hours)  Blood culture (routine x 2)  Status: None (Preliminary result)   Collection Time: 05/29/24  4:20 PM   Specimen: BLOOD  Result Value Ref Range Status   Specimen Description BLOOD SITE NOT SPECIFIED  Final   Special Requests   Final    BOTTLES DRAWN AEROBIC AND ANAEROBIC Blood Culture results may not be optimal due to an inadequate volume of blood received in culture bottles   Culture   Final    NO GROWTH < 24 HOURS Performed at [facility] Lab, [address] [place], [place] [address]    Report Status PENDING  Incomplete  Blood culture (routine x 2)     Status: None (Preliminary result)   Collection Time: 05/29/24  4:29 PM   Specimen: BLOOD  Result Value Ref Range Status   Specimen Description BLOOD SITE NOT SPECIFIED  Final   Special Requests   Final    BOTTLES DRAWN AEROBIC ONLY Blood Culture adequate volume   Culture   Final    NO GROWTH < 24 HOURS Performed at [facility] Lab, [address] [place], [place] [address]    Report Status PENDING  Incomplete  Aerobic/Anaerobic Culture w Gram Stain (surgical/deep wound)     Status: None (Preliminary result)   Collection Time: 05/29/24  7:04 PM   Specimen: Bone  Result Value Ref Range Status   Specimen Description BONE  Final   Special Requests NONE  Final   Gram Stain   Final    RARE WBC PRESENT, PREDOMINANTLY PMN NO ORGANISMS SEEN Performed at [facility] [place] Lab, [address],  [place], [place] [address]    Culture PENDING  Incomplete   Report Status PENDING  Incomplete    IMAGING: CT ABDOMEN PELVIS W CONTRAST Result Date: 05/29/2024 CLINICAL DATA:  Sacral wound EXAM: CT ABDOMEN AND PELVIS WITH CONTRAST TECHNIQUE: Multidetector CT imaging of the abdomen and pelvis was performed using the standard protocol following bolus administration of intravenous contrast. RADIATION DOSE REDUCTION: This exam was performed according to the departmental dose-optimization program which includes automated exposure control, adjustment of the mA and/or kV according to patient size and/or use of iterative reconstruction technique. CONTRAST:  75mL OMNIPAQUE  IOHEXOL  350 MG/ML SOLN COMPARISON:  CT abdomen and pelvis dated 11/06/2023 FINDINGS: Lower chest: Bibasilar patchy ground-glass opacities, likely atelectasis. No pleural effusion or pneumothorax demonstrated. Partially imaged multichamber cardiomegaly. Coronary artery calcifications. Hepatobiliary: No focal hepatic lesions. No intra or extrahepatic biliary ductal dilation. Cholelithiasis. Pancreas: 1.1 x 0.6 cm hypodensity in the pancreatic neck (3:26), not well seen on prior examinations in the absence of contrast enhancement. Spleen: Normal in size without focal abnormality. Adrenals/Urinary Tract: No adrenal nodules. No suspicious renal masses. No hydronephrosis. Multiple punctate nonobstructing left upper pole renal stones. No focal bladder wall thickening. Stomach/Bowel: Normal appearance of the stomach. Lower quadrant ostomy. No evidence of bowel wall thickening, distention, or inflammatory changes. Appendix is not discretely seen. Vascular/Lymphatic: Aortic atherosclerosis. No enlarged abdominal or pelvic lymph nodes. Reproductive: No adnexal masses. Other: Small volume presacral free fluid.  No fluid collection. Musculoskeletal: Diffuse body wall edema. Interval development of large sacral decubitus ulcer with cortical destruction of the underlying  distal sacrococcygeal spine and foci of gas extending into the presacral region. Multilevel degenerative changes of the T9 butterfly vertebra. Spine. IMPRESSION: 1. Interval development of large sacral decubitus ulcer with cortical destruction of the underlying distal sacrococcygeal spine and foci of gas extending into the presacral region, consistent with osteomyelitis. 2. A 1.1 x 0.6 cm hypodensity in the  pancreatic neck, not well seen on prior examinations in the absence of contrast enhancement. 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 Recommend further evaluation with nonemergent contrast-enhanced MRI/MRCP. 3. Cholelithiasis. 4. Multiple punctate nonobstructing left upper pole renal stones. 5.  Aortic Atherosclerosis (ICD10-I70.0). Electronically Signed   By: Limin  Xu M.D.   On: 05/29/2024 17:49    Assessment:   [name] is a 68 y.o. 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 She previously in July had MRSA bacteremia from foot infection with neg TTE, neg MRI but plan for IV vanco at HD through 9/22/ On admit no fever, wbc 12, crp 4, esr 17 bcx done. CT with deep decub with cortical destruction of the underlying distal sacrococcygeal spine and foci of gas extending into the presacral region, consistent with osteomyelitis.  S/p bedside debridement to bone by surgery with cx from bone sent. Gram stain showed GNR. Cx pending AAF since admit Per pt wound started while in rehab and was being treated there. Dced to home per pt 1 week ago. Reports did not have the equipment and help at home needed to care for self  Recommendations Restart vanco through at least 9/22 planned stop date for prior MRSA bacteremia Start zosyn  pending bone culture result Will need aggressive wound care,  pressure off loading and nutrition to heal wound. Thank you very much for allowing me to participate in the care of this patient. Please call with questions.   Alm SQUIBB. Epifanio, MD

## 2024-05-30 NOTE — Progress Notes (Signed)
 Pharmacy Antibiotic Note  [name] is a 68 y.o. female admitted on 05/29/2024 with sacral wound osteomyelitis and MRSA bacteremia. Pharmacy has been consulted for Vancomycin  and Zosyn  dosing.  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 She has been on outpatient vancomycin  regimen of 750mg  every Monday, Wednesday, and Friday with a planned stop date of 9/22 for MRSA bacteremia. We will continue vancomycin  while inpatient and add zosyn  pending bone culture result. Vancomycin  random level came back at 20 today which is therapeutic. Patient is scheduled for HD today and will receive a dose of vancomycin  750mg  IV after dialysis today. Per Nephrology's note, tentative plans are to follow her normal M/W/F dialysis schedule moving forward.  Plan: -Continue Vancomycin  750mg  IV M/W/F with HD -Start Zosyn  2.25g every 8 hours -Collect vancomycin  lab at least weekly and more frequently as needed -Monitor clinical status   Weight: 70.6 kg (155 lb 10.3 oz)  Temp (24hrs), Avg:98 F (36.7 C), Min:97.5 F (36.4 C), Max:98.4 F (36.9 C)  Recent Labs  Lab 05/29/24 1008 05/29/24 1907  WBC 12.4* 10.7*  CREATININE 4.47* 4.68*  VANCORANDOM  --  20    Estimated Creatinine Clearance: 11.2 mL/min (A) (by C-G formula based on SCr of 4.68 mg/dL (H)).    Allergies  Allergen Reactions   Nsaids Other (See Comments)    CKD stage 4   Lisinopril Other (See Comments)    coughing   Peanut-Containing Drug Products Itching and Other (See Comments)    GI intolerance - diarrhea    Antimicrobials this admission: Vancomycin  9/6 >>  Zosyn  9/6 >>    Microbiology results: 9/5 Bcx NGTD < 24 hours 9/5 Bone culture preliminary GNRs  Thank you for allowing pharmacy to be involved with this patient's care.  Mendel Barter, PharmD PGY1 Clinical  Pharmacist [facility]  05/30/2024 1:30 PM

## 2024-05-30 NOTE — Progress Notes (Signed)
 HD#1 SUBJECTIVE:  Patient Summary: [name] is a 68 y.o. 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   Overnight Events: No acute events overnight  Interim History: The patient was seen and evaluated at the bedside in dialysis this morning she reports that she is feeling better than she was yesterday however she is still experiencing significant pain in the sacral region.  She did ask if she was able to have some pain medication at this time.  She also asked about healing time for her wound which was discussed.  All questions and concerns were addressed at this time.  OBJECTIVE:  Vital Signs: Vitals:   05/30/24 0900 05/30/24 0930 05/30/24 1000 05/30/24 1030  BP: (!) 156/72 135/68 (!) 144/68 (!) 144/71  Pulse: 72 70 70 74  Resp: 16 (!) 6 13 15   Temp:      TempSrc:      SpO2: 100% 98% 97% 100%  Weight:       Supplemental O2: Room Air SpO2: 100 %  Filed Weights   05/30/24 0513 05/30/24 0805  Weight: 71.4 kg 70.4 kg     Intake/Output Summary (Last 24 hours) at 05/30/2024 1129 Last data filed at 05/29/2024 2200 Gross per 24 hour  Intake 237 ml  Output --  Net 237 ml   Net IO Since Admission: 237 mL [05/30/24 1129]  Physical Exam: Physical Exam  Patient Lines/Drains/Airways Status     Active Line/Drains/Airways     Name Placement date Placement time Site Days   Peripheral IV 05/29/24 20 G Right Antecubital 05/29/24  1032  Antecubital  1   Hemodialysis Catheter Left Internal jugular Double lumen Permanent (Tunneled) 04/23/24  1619  Internal jugular  37   Colostomy RUQ 11/02/23  1100  RUQ  210   Wound 04/20/24 1833 Surgical Closed Surgical Incision Knee Right 04/20/24  1833  Knee  40   Wound 05/29/24 2033 Pressure Injury Sacrum Medial Stage 4 - Full thickness tissue loss  with exposed bone, tendon or muscle. 05/29/24  2033  Sacrum  1            Pertinent labs and imaging:      Latest Ref Rng & Units 05/29/2024    7:07 PM 05/29/2024   10:08 AM 05/06/2024    6:08 PM  CBC  WBC 4.0 - 10.5 K/uL 10.7  12.4  13.5   Hemoglobin 12.0 - 15.0 g/dL 8.7  8.5  6.9  C  Hematocrit 36.0 - 46.0 % 29.8  29.8  23.5   Platelets 150 - 400 K/uL 287  293  205     C Corrected result       Latest Ref Rng & Units 05/29/2024    7:07 PM 05/29/2024   10:08 AM 05/06/2024    6:08 PM  CMP  Glucose 70 - 99 mg/dL 876  834  899   BUN 8 - 23 mg/dL 14  13  <5   Creatinine 0.44 - 1.00 mg/dL 5.31  5.52  8.02   Sodium 135 - 145 mmol/L 139  141  135   Potassium 3.5 - 5.1 mmol/L 3.4  3.5  3.6   Chloride 98 - 111 mmol/L 103  105  103   CO2 22 - 32 mmol/L 26  24  27  Calcium  8.9 - 10.3 mg/dL 8.2  8.4  7.6   Total Protein 6.5 - 8.1 g/dL   5.7   Total Bilirubin 0.0 - 1.2 mg/dL   0.8   Alkaline Phos 38 - 126 U/L   71   AST 15 - 41 U/L   15   ALT 0 - 44 U/L   6     CT ABDOMEN PELVIS W CONTRAST Result Date: 05/29/2024 CLINICAL DATA:  Sacral wound EXAM: CT ABDOMEN AND PELVIS WITH CONTRAST TECHNIQUE: Multidetector CT imaging of the abdomen and pelvis was performed using the standard protocol following bolus administration of intravenous contrast. RADIATION DOSE REDUCTION: This exam was performed according to the departmental dose-optimization program which includes automated exposure control, adjustment of the mA and/or kV according to patient size and/or use of iterative reconstruction technique. CONTRAST:  75mL OMNIPAQUE  IOHEXOL  350 MG/ML SOLN COMPARISON:  CT abdomen and pelvis dated 11/06/2023 FINDINGS: Lower chest: Bibasilar patchy ground-glass opacities, likely atelectasis. No pleural effusion or pneumothorax demonstrated. Partially imaged multichamber cardiomegaly. Coronary artery calcifications. Hepatobiliary: No focal hepatic lesions. No intra or extrahepatic biliary ductal dilation.  Cholelithiasis. Pancreas: 1.1 x 0.6 cm hypodensity in the pancreatic neck (3:26), not well seen on prior examinations in the absence of contrast enhancement. Spleen: Normal in size without focal abnormality. Adrenals/Urinary Tract: No adrenal nodules. No suspicious renal masses. No hydronephrosis. Multiple punctate nonobstructing left upper pole renal stones. No focal bladder wall thickening. Stomach/Bowel: Normal appearance of the stomach. Lower quadrant ostomy. No evidence of bowel wall thickening, distention, or inflammatory changes. Appendix is not discretely seen. Vascular/Lymphatic: Aortic atherosclerosis. No enlarged abdominal or pelvic lymph nodes. Reproductive: No adnexal masses. Other: Small volume presacral free fluid.  No fluid collection. Musculoskeletal: Diffuse body wall edema. Interval development of large sacral decubitus ulcer with cortical destruction of the underlying distal sacrococcygeal spine and foci of gas extending into the presacral region. Multilevel degenerative changes of the T9 butterfly vertebra. Spine. IMPRESSION: 1. Interval development of large sacral decubitus ulcer with cortical destruction of the underlying distal sacrococcygeal spine and foci of gas extending into the presacral region, consistent with osteomyelitis. 2. A 1.1 x 0.6 cm hypodensity in the pancreatic neck, not well seen on prior examinations in the absence of contrast enhancement. 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 Recommend further evaluation with nonemergent contrast-enhanced MRI/MRCP. 3. Cholelithiasis. 4. Multiple punctate nonobstructing left upper pole renal stones. 5.  Aortic Atherosclerosis (ICD10-I70.0). Electronically Signed   By: Limin  Xu M.D.   On: 05/29/2024 17:49    ASSESSMENT/PLAN:  Assessment: Principal Problem:   Sacral decubitus ulcer, stage IV (HCC) Active Problems:   Hypertension   Aortic atherosclerosis (HCC)   Chronic atrial fibrillation (HCC)   MRSA bacteremia   ESRD (end  stage renal disease) (HCC)   Primary hypercoagulable state (HCC)   BKA stump complication (HCC)   Normocytic anemia   Does not feel safe at home   Colostomy in place [facility])   Diabetes mellitus with kidney complication, with long-term current use of insulin  (HCC)   Plan: #Stage IV sacral decubitus ulcer #History of MRSA/strep B bacteremia #Malnutrition -The patient underwent extensive bedside debridement with general surgery yesterday.  At time of debridement they were able to probe to the bone and a bone culture was sent. -CTAP obtained which showed large sacral decubitus ulcer with cortical destruction and foci of gas extending to the presacral region consistent with osteomyelitis. -Patient remains without signs of systemic infection including remaining afebrile without constitutional symptoms  and minimal leukocytosis.  No concerns for sepsis at this time -Infectious disease was consulted yesterday who recommended not initiating new antibiotics until cultures could result.  She is on vancomycin  Monday Wednesday Friday until 06/15/2024 due to MRSA bacteremia.  This was continued inpatient. -Appreciate infectious disease input on this case. -RD consult placed to ensure proper nutrition to optimize wound healing.  Patient's albumin  on admission was 1.9. -Continue wound care -Every 2 turns -Will obtain air mattress -Bone culture pending -Blood cultures NGTD  #Status post right BKA with wound dehiscence - The patient reported knocking her residual level on a surface while being transported by her son.  She has not noticed a hanging skin flap with bloody tissue. This is what initiated her presentation to the ER. -Staples and dressing placed in the ED. -She was evaluated by Dr. Harden this morning who recommended no surgical intervention and to continue daily dressing changes with Vashe 4x4 gauze with ace wrap. He will continue to follow outpatient.  -No signs of cellulitis or acute wound  infection at this time  #ESRD on HD -Patient typically on MWF schedule.  Undergoing dialysis today due to missing her session yesterday while in the ER. -Nephrology following, appreciate their recommendations.  #Normocytic anemia #Iron  deficiency anemia #Anemia of chronic disease -Patient with chronic history of IDA likely secondary to multiple factors including iron  deficiency and chronic kidney disease. -Hemoglobin 8.7 today which does appear to be around her baseline.  Iron  studies obtained which showed a ferritin of 720 a total iron  of 14 and the rest was not able to be calculated.  Suspect increase in ferritin is likely reactive.   -Will hold off on IV iron  supplementation as this patient has an active infection requiring IV antibiotics  #Right internal jugular DVT - Last hospitalization the patient was found to have a right internal jugular DVT.  At that time her [facility] was removed and replaced. Continue Eliquis  5 mg twice daily  #History of diverticulitis #Status post ex lap with colostomy formation -Pt underwent exploratory laparotomy in February this year with subsequent colostomy formation. She is following with surgery outpatient.  -Colostomy in place and secure without signs of surrounding infection -Will contact wound care for assistance while inpatient.   #Chronic A-fib Continue Eliquis  5 mg twice daily  #Type 2 diabetes melitis A1c 5.1 Glucose 123 this morning Will hold off on insulin  coverage at this time and continue to monitor closely  Best Practice: Diet: Regular diet VTE: eliquis  Code: DNR/DNI  Disposition planning: Therapy Recs: Pending, DME: other pending DISPO: Anticipated discharge pending to Skilled nursing facility pending clinical improvement.  Signature:  Schuyler Novak, DO [facility]  11:29 AM, 05/30/2024  On Call pager [phone]

## 2024-05-30 NOTE — Progress Notes (Signed)
 [facility] Progress Note   Subjective:    Seen and examined patient on HD. UFG set for 1L and so far tolerating. BP is 139/69 and on RA.   Objective Vitals:   05/30/24 0900 05/30/24 0930 05/30/24 1000 05/30/24 1030  BP: (!) 156/72 135/68 (!) 144/68 (!) 144/71  Pulse: 72 70 70 74  Resp: 16 (!) 6 13 15   Temp:      TempSrc:      SpO2: 100% 98% 97% 100%  Weight:       Physical Exam General: Awake, alert, on RA, NAD Heart: S1 and S2; No murmurs, gallops, or rubs Lungs: Clear throughout Abdomen: Soft and non-tender Extremities: No edema LLE; R BKA Dialysis Access: R [facility]   Filed Weights   05/30/24 0513 05/30/24 0805  Weight: 71.4 kg 70.4 kg    Intake/Output Summary (Last 24 hours) at 05/30/2024 1058 Last data filed at 05/29/2024 2200 Gross per 24 hour  Intake 237 ml  Output --  Net 237 ml    Additional Objective Labs: Basic Metabolic Panel: Recent Labs  Lab 05/29/24 1008 05/29/24 1907  NA 141 139  K 3.5 3.4*  CL 105 103  CO2 24 26  GLUCOSE 165* 123*  BUN 13 14  CREATININE 4.47* 4.68*  CALCIUM  8.4* 8.2*  PHOS  --  2.9  2.8   Liver Function Tests: Recent Labs  Lab 05/29/24 1907  ALBUMIN  1.9*   No results for input(s): LIPASE, AMYLASE in the last 168 hours. CBC: Recent Labs  Lab 05/29/24 1008 05/29/24 1907  WBC 12.4* 10.7*  NEUTROABS 9.1* 8.7*  HGB 8.5* 8.7*  HCT 29.8* 29.8*  MCV 87.1 86.9  PLT 293 287   Blood Culture    Component Value Date/Time   SDES BONE 05/29/2024 1904   SPECREQUEST NONE 05/29/2024 1904   CULT PENDING 05/29/2024 1904   REPTSTATUS PENDING 05/29/2024 1904    Cardiac Enzymes: No results for input(s): CKTOTAL, CKMB, CKMBINDEX, TROPONINI in the last 168 hours. CBG: Recent Labs  Lab 05/29/24 1709 05/29/24 2228  GLUCAP 122* 139*   Iron  Studies:  Recent Labs    05/29/24 1907  IRON  14*  TIBC NOT CALCULATED  FERRITIN 720*   Lab Results  Component Value Date   INR 1.1 04/19/2024   INR  >10.0 (HH) 10/28/2023   INR 1.32 02/13/2013   Studies/Results: CT ABDOMEN PELVIS W CONTRAST Result Date: 05/29/2024 CLINICAL DATA:  Sacral wound EXAM: CT ABDOMEN AND PELVIS WITH CONTRAST TECHNIQUE: Multidetector CT imaging of the abdomen and pelvis was performed using the standard protocol following bolus administration of intravenous contrast. RADIATION DOSE REDUCTION: This exam was performed according to the departmental dose-optimization program which includes automated exposure control, adjustment of the mA and/or kV according to patient size and/or use of iterative reconstruction technique. CONTRAST:  75mL OMNIPAQUE  IOHEXOL  350 MG/ML SOLN COMPARISON:  CT abdomen and pelvis dated 11/06/2023 FINDINGS: Lower chest: Bibasilar patchy ground-glass opacities, likely atelectasis. No pleural effusion or pneumothorax demonstrated. Partially imaged multichamber cardiomegaly. Coronary artery calcifications. Hepatobiliary: No focal hepatic lesions. No intra or extrahepatic biliary ductal dilation. Cholelithiasis. Pancreas: 1.1 x 0.6 cm hypodensity in the pancreatic neck (3:26), not well seen on prior examinations in the absence of contrast enhancement. Spleen: Normal in size without focal abnormality. Adrenals/Urinary Tract: No adrenal nodules. No suspicious renal masses. No hydronephrosis. Multiple punctate nonobstructing left upper pole renal stones. No focal bladder wall thickening. Stomach/Bowel: Normal appearance of the stomach. Lower quadrant ostomy. No evidence of bowel  wall thickening, distention, or inflammatory changes. Appendix is not discretely seen. Vascular/Lymphatic: Aortic atherosclerosis. No enlarged abdominal or pelvic lymph nodes. Reproductive: No adnexal masses. Other: Small volume presacral free fluid.  No fluid collection. Musculoskeletal: Diffuse body wall edema. Interval development of large sacral decubitus ulcer with cortical destruction of the underlying distal sacrococcygeal spine and foci of  gas extending into the presacral region. Multilevel degenerative changes of the T9 butterfly vertebra. Spine. IMPRESSION: 1. Interval development of large sacral decubitus ulcer with cortical destruction of the underlying distal sacrococcygeal spine and foci of gas extending into the presacral region, consistent with osteomyelitis. 2. A 1.1 x 0.6 cm hypodensity in the pancreatic neck, not well seen on prior examinations in the absence of contrast enhancement. 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 Recommend further evaluation with nonemergent contrast-enhanced MRI/MRCP. 3. Cholelithiasis. 4. Multiple punctate nonobstructing left upper pole renal stones. 5.  Aortic Atherosclerosis (ICD10-I70.0). Electronically Signed   By: Limin  Xu M.D.   On: 05/29/2024 17:49    Medications:  anticoagulant sodium citrate      anticoagulant sodium citrate      vancomycin       acetaminophen   1,000 mg Oral Q6H   apixaban   5 mg Oral BID   brimonidine   1 drop Both Eyes BID   carvedilol   12.5 mg Oral BID WC   Chlorhexidine  Gluconate Cloth  6 each Topical Q0600   collagenase    Topical Daily   [START ON 05/31/2024] heparin   2,000 Units Dialysis Once in dialysis   HYDROmorphone   1 mg Oral Q6H   latanoprost   1 drop Both Eyes QHS   vancomycin  variable dose per unstable renal function (pharmacist dosing)   Does not apply See admin instructions    Dialysis Orders: MWF- [facility] 4hrs, BFR 350, DFR Auto 1.5,  EDW 67.1kg, 3K/ 2.5Ca Heparin  2500 unit bolus with hD Mircera 225 mcg q2wks - last 05/15/24 Calcitriol  0.25mcg PO qHD - last dose 05/27/24  Assessment/Plan: R BKA wound dehiscence: repaired with staples and dressing in the ED yesterday. Wound care following Sacral decubitus ulcer: S/p wound debridement by Surgery PA yesterday. Surgery recommended ID consult. CT ABD/pelvis showed osteo. Per Primary, pt was discharged on 8/5 on IV vancomycin  with HD for MRSA bacteremia coverage by ID. She was  instructed to continue this until 06/15/2024 for a total of an 8-week course. Continue here.  ESRD -  on HD MWF. On HD. Will resume MWF schedule next week. Hypertension/volume  - BP acceptable. Not overloaded on exam. EDW recently lowered in outpatient Anemia of CKD - Hgb 8.7, will obtain iron  studies. Although on IV ABXs. She was getting IV Fe in outpatient. Secondary Hyperparathyroidism -  Corr Ca is ok and phos on lower side. No binders for now. Nutrition - Renal diet with fluid restriction  Charmaine Piety, NP [facility] 05/30/2024,10:58 AM  LOS: 1 day

## 2024-05-30 NOTE — Progress Notes (Signed)
  Received patient in bed to unit.   Informed consent signed and in chart.    TX duration:3     Transported by staff Hand-off given to patient's nurse.  yes   Access used: CVC Dsg changed Access issues: Lines to be reversed   Total UF removed: 1000 L Medication(s) given: None Post HD VS: 145/84     Hunter Hacking LPN [facility]

## 2024-05-30 NOTE — Consult Note (Signed)
 WOC team reconsulted for Stage 4 Pressure Injury to sacrum.  Surgery has consulted on this wound, does not plan surgical debridement and recommended PT hydrotherapy.  Surgery also agreed with Santyl  2 times daily followed by saline moist to dry dressing.  Orders are in place.   WOC team will not follow. Re-consult if further needs arise.   Thank you,    Powell Bar MSN, RN-BC, [employer]

## 2024-05-30 NOTE — Progress Notes (Signed)
 Physical Therapy Wound Evaluation Patient Details  Name: [name] MRN: [id number] Date of Birth: 03/27/1956  Today's Date: 05/30/2024 Time: 8741-8659 Time Calculation (min): 42 min  Subjective  Subjective Assessment Patient and Family Stated Goals: healed up Date of Onset:  (last admission) Prior Treatments: last and this present admission  Pain Score:        Wound 05/29/24 2033 Pressure Injury Sacrum Medial Stage 4 - Full thickness tissue loss with exposed bone, tendon or muscle. (Active)      Site / Wound Assessment Yellow;Red 05/30/24 1414  Peri-wound Assessment Erythema (blanchable) 05/30/24 1414  Wound Length (cm) 11 cm 05/30/24 1414  Wound Width (cm) 8.5 cm 05/30/24 1414  Wound Surface Area (cm^2) 73.43 cm^2 05/30/24 1414  Wound Depth (cm) 3 cm 05/30/24 1414  Wound Volume (cm^3) 146.869 cm^3 05/30/24 1414  Drainage Description Serosanguineous 05/30/24 1414  Drainage Amount Small 05/30/24 1414  Treatment Cleansed;Debridement (Selective);Other (Comment);Packing (Saline gauze) 05/30/24 1414  Dressing Type Foam - Lift dressing to assess site every shift;Gauze (Comment);Santyl  05/30/24 1414  Dressing Changed Changed 05/30/24 1414  Dressing Status Clean, Dry, Intact 05/30/24 1414  State of Healing Eschar 05/30/24 1414  % Wound base Red or Granulating 50% 05/30/24 1414  % Wound base Yellow/Fibrinous Exudate 50% 05/30/24 1414  % Wound base Black/Eschar 0% 05/30/24 1414  % Wound base Other/Granulation Tissue (Comment) 0% 05/30/24 1414  Tunneling (cm) 6 05/29/24 2033  Undermining (cm) 12-3 oclock 05/30/24 1414  Margins Unattached edges (unapproximated) 05/30/24 1414   Selective Debridement (non-excisional) Selective Debridement (non-excisional) - Location: sacral Selective Debridement (non-excisional) - Tools Used: Forceps, Scalpel, Scissors Selective Debridement (non-excisional) - Tissue Removed: yellow eschar/slough-fairly wet    Wound Assessment and Plan  Wound  Therapy - Assess/Plan/Recommendations Wound Therapy - Clinical Statement: Pt with stage 4 sacral ulceration in need of continued selective debridement.  Agree with Vashe soaked guaze.  Expect decent wound healing with good nutrition and off loading. Wound Therapy - Functional Problem List: new BKA, presently participateing with acute OT/PT.  Slow and effortful mobility, CGA and rail to get to EOB. Factors Delaying/Impairing Wound Healing: Altered sensation, Diabetes Mellitus Hydrotherapy Plan: Debridement, Dressing change, Patient/family education Wound Therapy - Frequency: 2X / week Wound Therapy - Current Recommendations: PT Wound Therapy - Follow Up Recommendations: dressing changes by RN  Wound Therapy Goals- Improve the function of patient's integumentary system by progressing the wound(s) through the phases of wound healing (inflammation - proliferation - remodeling) by: Wound Therapy Goals - Improve the function of patient's integumentary system by progressing the wound(s) through the phases of wound healing by: Decrease Necrotic Tissue to: 20 Decrease Necrotic Tissue - Progress: Goal set today Increase Granulation Tissue to: 80 Increase Granulation Tissue - Progress: Goal set today Time For Goal Achievement: 7 days Wound Therapy - Potential for Goals: Good  Goals will be updated until maximal potential achieved or discharge criteria met.  Discharge criteria: when goals achieved, discharge from hospital, MD decision/surgical intervention, no progress towards goals, refusal/missing three consecutive treatments without notification or medical reason.  GP     Charges PT Wound Care Charges $Wound Debridement up to 20 cm: < or equal to 20 cm $ Wound Debridement each add'l 20 sqcm: 1 $PT Hydrotherapy Visit: 1 Visit       [name] 05/30/2024, 3:06 PM

## 2024-05-30 NOTE — Progress Notes (Signed)
 Patient ID: [name], female   DOB: [birth year], 68 y.o.   MRN: [id number] Patient is a 68 year old woman status post right below-knee amputation on July 28.  Patient has had wound dehiscence,  and the emergency room has placed staples in the area of wound dehiscence.  There is no ascending cellulitis there is clear serosanguineous drainage.  Patient will need daily dressing changes with Vashe 4 x 4 gauze Ace wrap and I will place an order for stump shrinker's.  I will follow-up in the office in a week.

## 2024-05-31 LAB — GLUCOSE, CAPILLARY
Glucose-Capillary: 119 mg/dL — ABNORMAL HIGH (ref 70–99)
Glucose-Capillary: 118 mg/dL — ABNORMAL HIGH (ref 70–99)
Glucose-Capillary: 182 mg/dL — ABNORMAL HIGH (ref 70–99)
Glucose-Capillary: 169 mg/dL — ABNORMAL HIGH (ref 70–99)

## 2024-05-31 LAB — HEPATITIS B SURFACE ANTIGEN: Hepatitis B Surface Ag: NONREACTIVE

## 2024-05-31 MED ORDER — RENA-VITE PO TABS
1.0000 | ORAL_TABLET | Freq: Every day | ORAL | Status: DC
  Administered 2024-05-31 – 2024-06-08 (×9): 1 via ORAL
  Filled 2024-05-31 (×9): qty 1

## 2024-05-31 MED ORDER — ENSURE PLUS HIGH PROTEIN PO LIQD
237.0000 mL | Freq: Three times a day (TID) | ORAL | Status: DC
  Administered 2024-05-31 – 2024-06-02 (×3): 237 mL via ORAL

## 2024-05-31 MED ORDER — HYDROMORPHONE HCL 2 MG PO TABS
1.0000 mg | ORAL_TABLET | Freq: Once | ORAL | Status: AC
  Administered 2024-05-31: 1 mg via ORAL
  Filled 2024-05-31: qty 1

## 2024-05-31 MED ORDER — VITAMIN C 500 MG PO TABS
500.0000 mg | ORAL_TABLET | Freq: Two times a day (BID) | ORAL | Status: DC
  Administered 2024-05-31 – 2024-06-09 (×18): 500 mg via ORAL
  Filled 2024-05-31 (×18): qty 1

## 2024-05-31 MED ORDER — DARBEPOETIN ALFA 200 MCG/0.4ML IJ SOSY
200.0000 ug | PREFILLED_SYRINGE | INTRAMUSCULAR | Status: DC
  Administered 2024-05-31: 200 ug via SUBCUTANEOUS
  Filled 2024-05-31 (×2): qty 0.4

## 2024-05-31 MED ORDER — ZINC SULFATE 220 (50 ZN) MG PO CAPS
220.0000 mg | ORAL_CAPSULE | Freq: Every day | ORAL | Status: DC
  Administered 2024-05-31 – 2024-06-09 (×9): 220 mg via ORAL
  Filled 2024-05-31 (×9): qty 1

## 2024-05-31 MED ORDER — CHLORHEXIDINE GLUCONATE CLOTH 2 % EX PADS
6.0000 | MEDICATED_PAD | Freq: Every day | CUTANEOUS | Status: DC
  Administered 2024-06-01 – 2024-06-07 (×7): 6 via TOPICAL

## 2024-05-31 NOTE — Progress Notes (Addendum)
 [facility] Progress Note   Subjective:    Seen and examined patient at bedside. Tolerated yesterday's HD with net UF 1L. She reports feeling fine with no acute complaints. Next HD 9/8.  Objective Vitals:   05/30/24 1545 05/30/24 2102 05/31/24 0707 05/31/24 1505  BP: (!) 109/58 94/66 (!) 147/68 (!) 116/46  Pulse: 76 72 68 72  Resp: 16   16  Temp: 98.2 F (36.8 C) 98.2 F (36.8 C) 97.6 F (36.4 C) (!) 97.5 F (36.4 C)  TempSrc: Oral Oral Oral Oral  SpO2: 100% 100% 100% 100%  Weight:       Physical Exam General: Awake, alert, on RA, NAD Heart: S1 and S2; No murmurs, gallops, or rubs Lungs: Clear throughout Abdomen: Soft and non-tender Extremities: No edema LLE; R BKA Dialysis Access: R [facility]   Filed Weights   05/30/24 0513 05/30/24 0805 05/30/24 1130  Weight: 71.4 kg 70.4 kg 70.6 kg    Intake/Output Summary (Last 24 hours) at 05/31/2024 1614 Last data filed at 05/31/2024 0300 Gross per 24 hour  Intake 410.58 ml  Output --  Net 410.58 ml    Additional Objective Labs: Basic Metabolic Panel: Recent Labs  Lab 05/29/24 1008 05/29/24 1907  NA 141 139  K 3.5 3.4*  CL 105 103  CO2 24 26  GLUCOSE 165* 123*  BUN 13 14  CREATININE 4.47* 4.68*  CALCIUM  8.4* 8.2*  PHOS  --  2.9  2.8   Liver Function Tests: Recent Labs  Lab 05/29/24 1907  ALBUMIN  1.9*   No results for input(s): LIPASE, AMYLASE in the last 168 hours. CBC: Recent Labs  Lab 05/29/24 1008 05/29/24 1907  WBC 12.4* 10.7*  NEUTROABS 9.1* 8.7*  HGB 8.5* 8.7*  HCT 29.8* 29.8*  MCV 87.1 86.9  PLT 293 287   Blood Culture    Component Value Date/Time   SDES BONE 05/29/2024 1904   SPECREQUEST NONE 05/29/2024 1904   CULT  05/29/2024 1904    FEW PROTEUS MIRABILIS CULTURE REINCUBATED FOR BETTER GROWTH NO ANAEROBES ISOLATED; CULTURE IN PROGRESS FOR 5 DAYS    REPTSTATUS PENDING 05/29/2024 1904    Cardiac Enzymes: No results for input(s): CKTOTAL, CKMB, CKMBINDEX,  TROPONINI in the last 168 hours. CBG: Recent Labs  Lab 05/29/24 2228 05/30/24 2152 05/31/24 0831 05/31/24 1153 05/31/24 1542  GLUCAP 139* 166* 119* 118* 182*   Iron  Studies:  Recent Labs    05/29/24 1907  IRON  14*  TIBC NOT CALCULATED  FERRITIN 720*   Lab Results  Component Value Date   INR 1.1 04/19/2024   INR >10.0 (HH) 10/28/2023   INR 1.32 02/13/2013   Studies/Results: CT ABDOMEN PELVIS W CONTRAST Result Date: 05/29/2024 CLINICAL DATA:  Sacral wound EXAM: CT ABDOMEN AND PELVIS WITH CONTRAST TECHNIQUE: Multidetector CT imaging of the abdomen and pelvis was performed using the standard protocol following bolus administration of intravenous contrast. RADIATION DOSE REDUCTION: This exam was performed according to the departmental dose-optimization program which includes automated exposure control, adjustment of the mA and/or kV according to patient size and/or use of iterative reconstruction technique. CONTRAST:  75mL OMNIPAQUE  IOHEXOL  350 MG/ML SOLN COMPARISON:  CT abdomen and pelvis dated 11/06/2023 FINDINGS: Lower chest: Bibasilar patchy ground-glass opacities, likely atelectasis. No pleural effusion or pneumothorax demonstrated. Partially imaged multichamber cardiomegaly. Coronary artery calcifications. Hepatobiliary: No focal hepatic lesions. No intra or extrahepatic biliary ductal dilation. Cholelithiasis. Pancreas: 1.1 x 0.6 cm hypodensity in the pancreatic neck (3:26), not well seen on prior examinations in  the absence of contrast enhancement. Spleen: Normal in size without focal abnormality. Adrenals/Urinary Tract: No adrenal nodules. No suspicious renal masses. No hydronephrosis. Multiple punctate nonobstructing left upper pole renal stones. No focal bladder wall thickening. Stomach/Bowel: Normal appearance of the stomach. Lower quadrant ostomy. No evidence of bowel wall thickening, distention, or inflammatory changes. Appendix is not discretely seen. Vascular/Lymphatic: Aortic  atherosclerosis. No enlarged abdominal or pelvic lymph nodes. Reproductive: No adnexal masses. Other: Small volume presacral free fluid.  No fluid collection. Musculoskeletal: Diffuse body wall edema. Interval development of large sacral decubitus ulcer with cortical destruction of the underlying distal sacrococcygeal spine and foci of gas extending into the presacral region. Multilevel degenerative changes of the T9 butterfly vertebra. Spine. IMPRESSION: 1. Interval development of large sacral decubitus ulcer with cortical destruction of the underlying distal sacrococcygeal spine and foci of gas extending into the presacral region, consistent with osteomyelitis. 2. A 1.1 x 0.6 cm hypodensity in the pancreatic neck, not well seen on prior examinations in the absence of contrast enhancement. 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 Recommend further evaluation with nonemergent contrast-enhanced MRI/MRCP. 3. Cholelithiasis. 4. Multiple punctate nonobstructing left upper pole renal stones. 5.  Aortic Atherosclerosis (ICD10-I70.0). Electronically Signed   By: Limin  Xu M.D.   On: 05/29/2024 17:49    Medications:  piperacillin -tazobactam (ZOSYN )  IV 2.25 g (05/31/24 1432)    acetaminophen   1,000 mg Oral Q6H   apixaban   5 mg Oral BID   ascorbic acid   500 mg Oral BID   brimonidine   1 drop Both Eyes BID   carvedilol   12.5 mg Oral BID WC   Chlorhexidine  Gluconate Cloth  6 each Topical Q0600   collagenase    Topical BID   feeding supplement  237 mL Oral TID BM   latanoprost   1 drop Both Eyes QHS   multivitamin  1 tablet Oral QHS   vancomycin  variable dose per unstable renal function (pharmacist dosing)   Does not apply See admin instructions   zinc  sulfate (50mg  elemental zinc )  220 mg Oral Daily    Dialysis Orders: MWF- [facility] 4hrs, BFR 350, DFR Auto 1.5,  EDW 67.1kg, 3K/ 2.5Ca Heparin  2500 unit bolus with hD Mircera 225 mcg q2wks - last 05/15/24 Calcitriol  0.25mcg PO qHD - last  dose 05/27/24   Assessment/Plan: R BKA wound dehiscence: repaired with staples and dressing in the ED 9/5. Wound care following Sacral decubitus ulcer: S/p wound debridement by Surgery PA 9/5. Surgery recommended ID consult. CT ABD/pelvis showed osteo. Per Primary, pt was discharged on 8/5 on IV vancomycin  with HD for MRSA bacteremia coverage by ID. She was instructed to continue this until 06/15/2024 for a total of an 8-week course. Continue here. Also on IV Zosyn . ESRD -  on HD MWF. Next HD 9/8. Hypertension/volume  - BP acceptable. Not overloaded on exam. EDW recently lowered in outpatient Anemia of CKD - Hgb 8.7, will obtain iron  studies. Although on IV ABXs. She was getting IV Fe in outpatient. Resuming ESA here. Secondary Hyperparathyroidism -  Corr Ca is ok and phos on lower side. No binders for now. Nutrition - Renal diet with fluid restriction  Charmaine Piety, NP [facility] 05/31/2024,4:14 PM  LOS: 2 days

## 2024-05-31 NOTE — Progress Notes (Signed)
 Initial Nutrition Assessment  DOCUMENTATION CODES:   Not applicable  INTERVENTION:   -Continue regular diet for widest variety of meal selections -Renal MVI daily -Ensure Plus High Protein po TID, each supplement provides 350 kcal and 20 grams of protein  -500 mg vitamin C  BID -220 mg zinc  sulfate daily x 14 days -RD will draw labs to assess for potential micronutrient deficiencies which may impede wound healing: vitamin A  NUTRITION DIAGNOSIS:   Increased nutrient needs related to wound healing as evidenced by estimated needs.  GOAL:   Patient will meet greater than or equal to 90% of their needs  MONITOR:   PO intake, Supplement acceptance  REASON FOR ASSESSMENT:   Consult Assessment of nutrition requirement/status, Wound healing  ASSESSMENT:   Pt with PMH of HTN, CAD, a-fib, diabetes mellitus, ESRD on HD, Diabetic neuropathy, HLD, and diabetic retinopathy who presented with right lower extremity wound dehiscence and admitted for stage IV sacral decubitus ulcer  Pt admitted with stage IV sacral pressure injury.   9/5- s/p bedside debridement of sacral pressure injury by general surgery, ortho placed staple on rt BKA area of dehiscence  9/6- hydrotherapy started  Reviewed I/O's: -589 ml x 24 hours and -352 ml since admission  Pt unavailable at time of visit. Attempted to speak with pt via call to hospital room phone, however, unable to reach. RD unable to obtain further nutrition-related history or complete nutrition-focused physical exam at this time.    Per TOC notes, pt recently at [facility] for rehab and transitioned home at the end of 04/2024. Pt has been followed by home health services.   Per [facility] notes, no plan for surgical debridement. Hydrotherapy initiated on 05/30/24.  Pt currently on a regular diet. Noted meal completions 0%.   Per nephrology notes, EDW 67.1 kg. Reviewed wt hx; pt has experienced a 18.4% wt loss over the past 6 months, which is  significant for time frame. Suspect some wt loss may be related to amputation and fluid losses from HD. Given wounds and multiple co-morbidities, pt is at high risk for malnutrition and would benefit from addition of oral nutrition supplements.   Medications reviewed.  Lab Results  Component Value Date   HGBA1C 5.1 05/29/2024   PTA DM medications are 7-10 units insulin  glargine daily and 0-9 units insulin  aspart TID.   Labs reviewed: K 3.4, CBGS: 118-166 (inpatient orders for glycemic control are none).    Diet Order:   Diet Order             Diet regular Room service appropriate? Yes; Fluid consistency: Thin  Diet effective now                   EDUCATION NEEDS:   No education needs have been identified at this time  Skin:  Skin Assessment: Skin Integrity Issues: Skin Integrity Issues:: Stage IV Stage IV: sacrum  Last BM:  05/28/24 (via colostomy)  Height:   Ht Readings from Last 1 Encounters:  05/06/24 5' 7 (1.702 m)    Weight:   Wt Readings from Last 1 Encounters:  05/30/24 70.6 kg    Ideal Body Weight:  57.4 kg (adjusted for rt BKA)  BMI:  Body mass index is 24.38 kg/m.  Estimated Nutritional Needs:   Kcal:  2000-2200  Protein:  100-115 grams  Fluid:  1000 ml + UOP    Margery ORN, RD, LDN, CDCES Registered Dietitian III Certified Diabetes Care and Education Specialist If unable  to reach this RD, please use RD Inpatient group chat on secure chat between hours of 8am-4 pm daily

## 2024-05-31 NOTE — Plan of Care (Signed)
  Problem: Pain Managment: Goal: General experience of comfort will improve and/or be controlled Outcome: Progressing   Problem: Safety: Goal: Ability to remain free from injury will improve Outcome: Progressing   Problem: Skin Integrity: Goal: Risk for impaired skin integrity will decrease Outcome: Progressing

## 2024-05-31 NOTE — Progress Notes (Addendum)
 Interval history No new concerns. Pain relatively well-controlled. Offloading air mattress helps somewhat.  Physical exam Blood pressure (!) 147/68, pulse 68, temperature 97.6 F (36.4 C), temperature source Oral, resp. rate 16, weight 70.6 kg, SpO2 100%.  No distress Moist pink oral mucosa Strong bilateral radial pulses Breathing comfortably on room air, posterior lung fields clear except trace dependent crackles in RLL as she lays on right side Skin warm and dry, right residual limb covered with sock, sacral wound covered with foam dressing Alert, speech is normal, no facial asymmetry  Weight change: -1.042 kg   Intake/Output Summary (Last 24 hours) at 05/31/2024 1439 Last data filed at 05/31/2024 0300 Gross per 24 hour  Intake 410.58 ml  Output --  Net 410.58 ml   Net IO Since Admission: -352.42 mL [05/31/24 1439]  Labs, images, and other studies Bone culture growing P. Mirabilis w/ resistance to ciprofloxacin  and cefazolin   Assessment and plan Hospital day 2  [name] is a 68 y.o. with ESRD on HD, chronic atrial fibrillation, right BKA who presents with residual limb complications and is found to have sacral pressure ulcer complicated by osteomyelitis.  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 ID following. On Zosyn  (as well as vancomycin  for prior MRSA bacteremia). Bone fragment from bedside debridement growing P. Mirabilis without much resistance, wonder about narrowing to ceftriaxone for this, will look for guidance from ID. Antibiotics not curative, however. She's going to need very thorough wound care and adequate nutrition for healing, complete healing may be difficult to achieve.  Active Problems:   MRSA bacteremia Diagnosed during July 2025 hospitalization, on IV antibiotics through 06/15/24 for this. -resumed outpatient vancomycin , was being  given on dialysis days    Hypertension Chronic, stable. Outpatient medications resumed. -carvedilol  12.5 mg bid    Aortic atherosclerosis (HCC) Seen on admission CT scan. Not on statin. Check lipids with next routine lab test. Start statin on discharge.    Chronic atrial fibrillation (HCC) Chronic, stable. Regular rate and rhythm on exam today. Continue DOAC and beta blocker -eliquis  5 mg bid -carvedilol  per above      Primary hypercoagulable state (HCC) Due to atrial fibrillation. Continue DOAC per above.    ESRD on dialysis [facility]) Nephrology following, appreciate their assistance with HD for this person while hospitalized.    BKA stump complication (HCC) Admitted with dehiscence of R BKA incision, repaired in ED, no operative follow-up necessary.    Normocytic anemia Chronic, in setting of CKD and inflammation.    Does not feel safe at home No abuse or mistreatment but environment at home is not appropriate for the level of care she needs. She'll need skilled nursing for advanced care needs and wound care.    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 No complications.    Diabetes mellitus with kidney complication, with long-term current use of insulin  (HCC) Chronic, stable. No hyperglycemia during hospitalization. Electing to hold outpatient insulin  therapy.    Hypoalbuminemia due to protein-calorie malnutrition (HCC) Poor prognostic finding in this person with sacral pressure ulcer and osteomyelitis. Body mass index is 24.38 kg/m. However, note ~70 lbs of weight loss since hospitalization in February when she was critically ill. Would appreciate insight of RD for nutrition evaluation.  VTE prophylaxis:  Diet: regular  Code: DNR/DNI PT/OT recommendations: home health PT/OT TOC recommendations: pending Family Update: will update by phone  Discharge plan: pending referral to appropriate venue  for care outside of hospital   Ozell Kung MD 05/31/2024, 2:39 PM  Pager: [phone]

## 2024-06-01 DIAGNOSIS — L02818 Cutaneous abscess of other sites: Secondary | ICD-10-CM | POA: Diagnosis not present

## 2024-06-01 DIAGNOSIS — B9562 Methicillin resistant Staphylococcus aureus infection as the cause of diseases classified elsewhere: Secondary | ICD-10-CM

## 2024-06-01 DIAGNOSIS — M4628 Osteomyelitis of vertebra, sacral and sacrococcygeal region: Secondary | ICD-10-CM | POA: Diagnosis not present

## 2024-06-01 DIAGNOSIS — Z8614 Personal history of Methicillin resistant Staphylococcus aureus infection: Secondary | ICD-10-CM | POA: Diagnosis not present

## 2024-06-01 DIAGNOSIS — Z933 Colostomy status: Secondary | ICD-10-CM

## 2024-06-01 DIAGNOSIS — Z79899 Other long term (current) drug therapy: Secondary | ICD-10-CM

## 2024-06-01 DIAGNOSIS — Z7901 Long term (current) use of anticoagulants: Secondary | ICD-10-CM

## 2024-06-01 DIAGNOSIS — Z8619 Personal history of other infectious and parasitic diseases: Secondary | ICD-10-CM

## 2024-06-01 DIAGNOSIS — Z8719 Personal history of other diseases of the digestive system: Secondary | ICD-10-CM

## 2024-06-01 DIAGNOSIS — Z792 Long term (current) use of antibiotics: Secondary | ICD-10-CM

## 2024-06-01 DIAGNOSIS — I482 Chronic atrial fibrillation, unspecified: Secondary | ICD-10-CM

## 2024-06-01 DIAGNOSIS — E43 Unspecified severe protein-calorie malnutrition: Secondary | ICD-10-CM

## 2024-06-01 LAB — CBC WITH DIFFERENTIAL/PLATELET
Abs Immature Granulocytes: 0.14 K/uL — ABNORMAL HIGH (ref 0.00–0.07)
Basophils Absolute: 0.1 K/uL (ref 0.0–0.1)
Basophils Relative: 1 %
Eosinophils Absolute: 0.3 K/uL (ref 0.0–0.5)
Eosinophils Relative: 3 %
HCT: 29.7 % — ABNORMAL LOW (ref 36.0–46.0)
Hemoglobin: 9 g/dL — ABNORMAL LOW (ref 12.0–15.0)
Immature Granulocytes: 1 %
Lymphocytes Relative: 10 %
Lymphs Abs: 1 K/uL (ref 0.7–4.0)
MCH: 25.9 pg — ABNORMAL LOW (ref 26.0–34.0)
MCHC: 30.3 g/dL (ref 30.0–36.0)
MCV: 85.3 fL (ref 80.0–100.0)
Monocytes Absolute: 1.3 K/uL — ABNORMAL HIGH (ref 0.1–1.0)
Monocytes Relative: 13 %
Neutro Abs: 7.2 K/uL (ref 1.7–7.7)
Neutrophils Relative %: 72 %
Platelets: 341 K/uL (ref 150–400)
RBC: 3.48 MIL/uL — ABNORMAL LOW (ref 3.87–5.11)
RDW: 20.5 % — ABNORMAL HIGH (ref 11.5–15.5)
WBC: 10 K/uL (ref 4.0–10.5)
nRBC: 0 % (ref 0.0–0.2)

## 2024-06-01 LAB — RENAL FUNCTION PANEL
Albumin: <1.5 g/dL — ABNORMAL LOW (ref 3.5–5.0)
Anion gap: 12 (ref 5–15)
BUN: 19 mg/dL (ref 8–23)
CO2: 26 mmol/L (ref 22–32)
Calcium: 7.8 mg/dL — ABNORMAL LOW (ref 8.9–10.3)
Chloride: 99 mmol/L (ref 98–111)
Creatinine, Ser: 4.42 mg/dL — ABNORMAL HIGH (ref 0.44–1.00)
GFR, Estimated: 10 mL/min — ABNORMAL LOW (ref >60)
Glucose, Bld: 169 mg/dL — ABNORMAL HIGH (ref 70–99)
Phosphorus: 3.9 mg/dL (ref 2.5–4.6)
Potassium: 3.9 mmol/L (ref 3.5–5.1)
Sodium: 137 mmol/L (ref 135–145)

## 2024-06-01 LAB — GLUCOSE, CAPILLARY
Glucose-Capillary: 122 mg/dL — ABNORMAL HIGH (ref 70–99)
Glucose-Capillary: 144 mg/dL — ABNORMAL HIGH (ref 70–99)
Glucose-Capillary: 129 mg/dL — ABNORMAL HIGH (ref 70–99)

## 2024-06-01 MED ORDER — PNEUMOCOCCAL 20-VAL CONJ VACC 0.5 ML IM SUSY
0.5000 mL | PREFILLED_SYRINGE | INTRAMUSCULAR | Status: AC | PRN
  Administered 2024-06-09: 0.5 mL via INTRAMUSCULAR
  Filled 2024-06-01: qty 0.5

## 2024-06-01 MED ORDER — SODIUM CHLORIDE 0.9 % IV SOLN: 3.0000 g | INTRAVENOUS | Status: DC

## 2024-06-01 MED ORDER — SODIUM CHLORIDE 0.9 % IV SOLN: 1.0000 g | INTRAVENOUS | Status: DC

## 2024-06-01 MED ORDER — ALTEPLASE 2 MG IJ SOLR: 2.0000 mg | Freq: Once | INTRAMUSCULAR | Status: DC | PRN

## 2024-06-01 MED ORDER — HEPARIN SODIUM (PORCINE) 1000 UNIT/ML DIALYSIS: 1000.0000 [IU] | INTRAMUSCULAR | Status: DC | PRN

## 2024-06-01 MED ORDER — FENTANYL CITRATE PF 50 MCG/ML IJ SOSY
25.0000 ug | PREFILLED_SYRINGE | INTRAMUSCULAR | Status: DC | PRN
  Filled 2024-06-01: qty 1

## 2024-06-01 MED ORDER — LACTATED RINGERS IV BOLUS
500.0000 mL | Freq: Once | INTRAVENOUS | Status: AC
  Administered 2024-06-01: 500 mL via INTRAVENOUS

## 2024-06-01 MED ORDER — HYDROMORPHONE HCL 2 MG PO TABS
1.0000 mg | ORAL_TABLET | ORAL | Status: DC | PRN
  Administered 2024-06-01: 1 mg via ORAL
  Filled 2024-06-01: qty 1

## 2024-06-01 MED ORDER — LACTATED RINGERS IV BOLUS
250.0000 mL | Freq: Once | INTRAVENOUS | Status: AC
  Administered 2024-06-02: 250 mL via INTRAVENOUS

## 2024-06-01 MED ORDER — VANCOMYCIN IV (FOR PTA / DISCHARGE USE ONLY): 750.0000 mg | INTRAVENOUS | Status: DC

## 2024-06-01 MED ORDER — HYDROMORPHONE HCL 2 MG PO TABS
1.0000 mg | ORAL_TABLET | Freq: Four times a day (QID) | ORAL | Status: DC
  Administered 2024-06-01 – 2024-06-02 (×4): 1 mg via ORAL
  Filled 2024-06-01 (×4): qty 1

## 2024-06-01 MED ORDER — SODIUM CHLORIDE 0.9 % IV SOLN
1.0000 g | INTRAVENOUS | Status: DC
  Administered 2024-06-01 – 2024-06-08 (×4): 1 g via INTRAVENOUS
  Filled 2024-06-01 (×4): qty 1

## 2024-06-01 MED ORDER — VANCOMYCIN HCL 750 MG/150ML IV SOLN: 750.0000 mg | INTRAVENOUS | Status: DC | PRN

## 2024-06-01 MED ORDER — DEXTROSE 5 % IV SOLN
0.5000 g | INTRAVENOUS | Status: DC
  Filled 2024-06-01: qty 0.5

## 2024-06-01 MED ORDER — HEPARIN SODIUM (PORCINE) 1000 UNIT/ML IJ SOLN
INTRAMUSCULAR | Status: AC
  Filled 2024-06-01: qty 4

## 2024-06-01 NOTE — Progress Notes (Signed)
  Received patient in bed to unit.   Informed consent signed and in chart.    TX duration:3.5     Transported by  Hand-off given to patient's nurse.    Access used: left CVC Access issues: Lines ran reversed   Total UF removed: 1000 Medication(s) given: none Post HD VS: 125/57 Post HD weight: unable to obtain due to bed     [facility]  LPN Kidney Dialysis Unit

## 2024-06-01 NOTE — Progress Notes (Signed)
 Pt receives out-pt HD, MWF at [facility] clinic, chair time 1050. Will assist as needed.    Lavanda Joshau Code Dialysis Navigator [phone]

## 2024-06-01 NOTE — Progress Notes (Signed)
 [facility] Progress Note   Subjective:   Seen and examined patient at bedside.  She reports feeling fine with no acute complaints. For dialysis today.    Objective Vitals:   05/31/24 1740 05/31/24 2000 06/01/24 0413 06/01/24 0839  BP: 96/60 123/60 (!) 101/46 (!) 119/55  Pulse: 77 85 72 71  Resp: 17 18 18 18   Temp:  (!) 96.4 F (35.8 C) 98 F (36.7 C) 97.9 F (36.6 C)  TempSrc:  Oral Oral Oral  SpO2: 100% 100% 100% 100%  Weight:       Physical Exam General: Awake, alert, on RA, NAD Heart: S1 and S2; No murmurs, gallops, or rubs Lungs: Clear throughout Abdomen: Soft and non-tender - colostomy Extremities: No edema LLE; R BKA Dialysis Access: R [facility] c/d/i  Filed Weights   05/30/24 0513 05/30/24 0805 05/30/24 1130  Weight: 71.4 kg 70.4 kg 70.6 kg   No intake or output data in the 24 hours ending 06/01/24 0930   Additional Objective Labs: Basic Metabolic Panel: Recent Labs  Lab 05/29/24 1008 05/29/24 1907 06/01/24 0339  NA 141 139 137  K 3.5 3.4* 3.9  CL 105 103 99  CO2 24 26 26   GLUCOSE 165* 123* 169*  BUN 13 14 19   CREATININE 4.47* 4.68* 4.42*  CALCIUM  8.4* 8.2* 7.8*  PHOS  --  2.9  2.8 3.9   Liver Function Tests: Recent Labs  Lab 05/29/24 1907 06/01/24 0339  ALBUMIN  1.9* <1.5*   No results for input(s): LIPASE, AMYLASE in the last 168 hours. CBC: Recent Labs  Lab 05/29/24 1008 05/29/24 1907 06/01/24 0339  WBC 12.4* 10.7* 10.0  NEUTROABS 9.1* 8.7* 7.2  HGB 8.5* 8.7* 9.0*  HCT 29.8* 29.8* 29.7*  MCV 87.1 86.9 85.3  PLT 293 287 341   Blood Culture    Component Value Date/Time   SDES BONE 05/29/2024 1904   SPECREQUEST NONE 05/29/2024 1904   CULT  05/29/2024 1904    FEW PROTEUS MIRABILIS CULTURE REINCUBATED FOR BETTER GROWTH NO ANAEROBES ISOLATED; CULTURE IN PROGRESS FOR 5 DAYS    REPTSTATUS PENDING 05/29/2024 1904    Cardiac Enzymes: No results for input(s): CKTOTAL, CKMB, CKMBINDEX, TROPONINI in the last  168 hours. CBG: Recent Labs  Lab 05/31/24 0831 05/31/24 1153 05/31/24 1542 05/31/24 1714 06/01/24 0838  GLUCAP 119* 118* 182* 169* 122*   Iron  Studies:  Recent Labs    05/29/24 1907  IRON  14*  TIBC NOT CALCULATED  FERRITIN 720*   Lab Results  Component Value Date   INR 1.1 04/19/2024   INR >10.0 (HH) 10/28/2023   INR 1.32 02/13/2013   Studies/Results: No results found.   Medications:  piperacillin -tazobactam (ZOSYN )  IV 2.25 g (06/01/24 0554)    acetaminophen   1,000 mg Oral Q6H   apixaban   5 mg Oral BID   ascorbic acid   500 mg Oral BID   brimonidine   1 drop Both Eyes BID   carvedilol   12.5 mg Oral BID WC   Chlorhexidine  Gluconate Cloth  6 each Topical Q0600   collagenase    Topical BID   darbepoetin (ARANESP ) injection - DIALYSIS  200 mcg Subcutaneous Q Sun-1800   feeding supplement  237 mL Oral TID BM   latanoprost   1 drop Both Eyes QHS   multivitamin  1 tablet Oral QHS   vancomycin  variable dose per unstable renal function (pharmacist dosing)   Does not apply See admin instructions   zinc  sulfate (50mg  elemental zinc )  220 mg Oral Daily  Dialysis Orders: MWF- [facility] 4hrs, BFR 350, DFR Auto 1.5,  EDW 67.1kg, 3K/ 2.5Ca Heparin  2500 unit bolus with hD Mircera 225 mcg q2wks - last 05/15/24 Calcitriol  0.25mcg PO qHD - last dose 05/27/24   Assessment/Plan: R BKA wound dehiscence: repaired with staples and dressing in the ED 9/5. Wound care following Sacral decubitus ulcer: S/p wound debridement by Surgery PA 9/5. Surgery recommended ID consult. CT ABD/pelvis showed osteo. Per Primary, pt was discharged on 8/5 on IV vancomycin  with HD for MRSA bacteremia coverage by ID. Plan  to continue this until 06/15/2024 for a total of an 8-week course. Continue here. Also on IV Zosyn  - I'm unclear on anticipated duration of therapy but it appears this is pending a bone culture result. ESRD -  on HD MWF. Next HD 9/8. Hypertension/volume  - BP acceptable.  Not overloaded on exam. EDW recently lowered in outpatient Anemia of CKD - Hgb 8-9s, no IV iron  while getting IV abx for severe infection. ON ESA here - aranesp  200 given 9/7. Secondary Hyperparathyroidism -  Corr Ca is ok and phos on lower side. No binders for now. Nutrition - Renal diet with fluid restriction   Dipso plan is SNF on d/c.   Manuelita Barters MD [facility] Pager [phone]

## 2024-06-01 NOTE — Progress Notes (Signed)
 Mobility Specialist Progress Note:    06/01/24 0921  Mobility  Activity Pivoted/transferred to/from [facility]  Level of Assistance Contact guard assist, steadying assist  Assistive Device Other (Comment) (Handrails)  Distance Ambulated (ft) 3 ft  RLE Weight Bearing Per Provider Order NWB  Activity Response Tolerated well  Mobility Referral Yes  Mobility visit 1 Mobility  Mobility Specialist Start Time (ACUTE ONLY) [id number]  Mobility Specialist Stop Time (ACUTE ONLY) 0931  Mobility Specialist Time Calculation (min) (ACUTE ONLY) 10 min   Received pt in bed requesting to transfer to the [facility] No physical assistance needed. No c/o. Pt requested time and instructed to use call light once finished. Call light and personal belongings within reach. All needs met.  Lavanda Pollack Mobility Specialist  Please contact via [employer] or  Rehab Office [phone]

## 2024-06-01 NOTE — Progress Notes (Signed)
 [facility]  Date of Admission:  05/29/2024   Total days of inpatient antibiotics 4  Principal Problem:   Osteomyelitis of vertebra, sacral and sacrococcygeal region [facility]) Active Problems:   Hypertension   Aortic atherosclerosis (HCC)   Chronic atrial fibrillation (HCC)   MRSA bacteremia   ESRD (end stage renal disease) (HCC)   Primary hypercoagulable state (HCC)   BKA stump complication (HCC)   Normocytic anemia   Does not feel safe at home   Colostomy in place 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)   Hypoalbuminemia due to protein-calorie malnutrition [facility])          Assessment: 68 year old female with complex medical history of MRSA and group B strep bacteremia from necrotizing fasciitis and osteomyelitis of right lower extremity status post BKA on 04/20/2024,  discharged on IV vancomycin  x 8 weeks EOT 9/22 admitted with: #Sacral abscess and osteomyelitis status post debridement recommend to bone -CT abdomen pelvis showed large sacral decubitus ulcer with cortical destruction underlying sacrococcygeal spine and foci of gas extended presacral region consistent osteomyelitis Patient underwent bedside debridement of decubitus ulcer, bone biopsy obtained - Bone biopsy from 9/5 grew Proteus mirabilis, blood cultures remain negative  #History of MRSA and group B strep bacteremia - Patient was admitted on 7/27 for concern for nec fasc of right lower extremity.  Open BKA on 7/28 she was having some shoulder and back pain.  MRI was negative.  Given back pain and shoulder pain, plan was to treat with 8 weeks of antibiotics EOT 9/22 Recommendations: -discontinue pip-tazo - Start ceftazidime  to complete 6 weeks from OR with HD EOT 10/16 - Continue vancomycin  with HD for MRSA bacteremia EOT 9/22 - ID follow-up with Dr. Overton on 9/16 - Plan communicated  primary -ID will sign off  Microbiology:   Antibiotics: Vancomycin  through 9/22 Pip-tazo 9/6- Cultures: Blood 9/5 no growth Urine  Other 9/5 wound cultures growing Proteus mirabilis  SUBJECTIVE: Sitting in bed.  No new complaints. Interval: Febrile overnight WBC 10K  Review of Systems: Review of Systems  All other systems reviewed and are negative.    Scheduled Meds:  acetaminophen   1,000 mg Oral Q6H   apixaban   5 mg Oral BID   ascorbic acid   500 mg Oral BID   brimonidine   1 drop Both Eyes BID   carvedilol   12.5 mg Oral BID WC   Chlorhexidine  Gluconate Cloth  6 each Topical Q0600   collagenase    Topical BID   darbepoetin (ARANESP ) injection - DIALYSIS  200 mcg Subcutaneous Q Sun-1800   feeding supplement  237 mL Oral TID BM   HYDROmorphone   1 mg Oral Q6H   latanoprost   1 drop Both Eyes QHS   multivitamin  1 tablet Oral QHS   zinc  sulfate (50mg  elemental zinc )  220 mg Oral Daily   Continuous Infusions:  cefTAZidime  (FORTAZ )  IV     vancomycin      PRN Meds:.alteplase , fentaNYL  (SUBLIMAZE ) injection, heparin , melatonin, pneumococcal 20-valent conjugate vaccine, vancomycin  Allergies  Allergen Reactions   Nsaids Other (See Comments)    CKD stage 4   Lisinopril Other (See Comments)    coughing   Peanut-Containing Drug Products Itching and Other (See Comments)    GI intolerance - diarrhea    OBJECTIVE: Vitals:   06/01/24 1432 06/01/24 1445 06/01/24 1500 06/01/24 1515  BP: (!) 89/58 (!) 100/59 (!) 109/57 114/66  Pulse: 68 67 69 72  Resp: 12 13 12 14   Temp:      TempSrc:      SpO2: 97% 99% 97% 98%  Weight:       Body mass index is 24.38 kg/m.  Physical Exam Constitutional:      Appearance: Normal appearance.  HENT:     Head: Normocephalic and atraumatic.     Right Ear: Tympanic membrane normal.     Left Ear: Tympanic membrane normal.     Nose: Nose normal.     Mouth/Throat:     Mouth: Mucous membranes are moist.  Eyes:     Extraocular Movements:  Extraocular movements intact.     Conjunctiva/sclera: Conjunctivae normal.     Pupils: Pupils are equal, round, and reactive to light.  Cardiovascular:     Rate and Rhythm: Normal rate and regular rhythm.     Heart sounds: No murmur heard.    No friction rub. No gallop.  Pulmonary:     Effort: Pulmonary effort is normal.     Breath sounds: Normal breath sounds.  Abdominal:     General: Abdomen is flat.     Palpations: Abdomen is soft.  Neurological:     General: No focal deficit present.     Mental Status: She is alert and oriented to person, place, and time.  Psychiatric:        Mood and Affect: Mood normal.       Lab Results Lab Results  Component Value Date   WBC 10.0 06/01/2024   HGB 9.0 (L) 06/01/2024   HCT 29.7 (L) 06/01/2024   MCV 85.3 06/01/2024   PLT 341 06/01/2024    Lab Results  Component Value Date   CREATININE 4.42 (H) 06/01/2024   BUN 19 06/01/2024   NA 137 06/01/2024   K 3.9 06/01/2024   CL 99 06/01/2024   CO2 26 06/01/2024    Lab Results  Component Value Date   ALT 6 05/06/2024   AST 15 05/06/2024   ALKPHOS 71 05/06/2024   BILITOT 0.8 05/06/2024        Loney Stank, MD [facility] 06/01/2024, 3:31 PM  Evaluation of this patient requires complex antimicrobial therapy evaluation and counseling + isolation needs for disease transmission risk assessment and mitigation

## 2024-06-01 NOTE — TOC Progression Note (Addendum)
 Transition of Care (TOC) - Progression Note   Referral for LTAC . [name] with [facility] reviewing , await determination   Attending team discussed LTAC with patient   [name] with [facility] will submit for insurance authorization today   Updated [name] with [facility]  Patient Details  Name: [name] MRN: [id number] Date of Birth: 10-07-1955  Transition of Care [facility]) CM/SW Contact  Stephane Powell Jansky, RN Phone Number: 06/01/2024, 10:12 AM  Clinical Narrative:                         Expected Discharge Plan and Services                                               Social Drivers of Health (SDOH) Interventions SDOH Screenings   Food Insecurity: No Food Insecurity (05/29/2024)  Housing: Low Risk  (05/29/2024)  Transportation Needs: No Transportation Needs (05/29/2024)  Utilities: Not At Risk (05/29/2024)  Depression (PHQ2-9): Low Risk  (02/28/2021)  Financial Resource Strain: Low Risk  (08/25/2021)   Received from [facility]  Physical Activity: Insufficiently Active (06/20/2021)   Received from [facility]  Social Connections: Moderately Integrated (05/29/2024)  Recent Concern: Social Connections - Socially Isolated (04/19/2024)  Stress: No Stress Concern Present (08/25/2021)   Received from [facility]  Tobacco Use: Low Risk  (05/29/2024)    Readmission Risk Interventions     No data to display

## 2024-06-01 NOTE — Plan of Care (Signed)
  Problem: Pain Managment: Goal: General experience of comfort will improve and/or be controlled Outcome: Progressing   Problem: Safety: Goal: Ability to remain free from injury will improve Outcome: Progressing   Problem: Skin Integrity: Goal: Risk for impaired skin integrity will decrease Outcome: Progressing

## 2024-06-01 NOTE — Progress Notes (Signed)
 HD#3 SUBJECTIVE:  Patient Summary: [name] is a 68 y.o. 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   Overnight Events: No acute events overnight  Interim History: Seen and examined at the bedside this morning.  She reports that she is feeling much better overall however she is still experiencing 8 out of 10 pain.  She continues to voice that she would like to go to a nursing facility or long-term acute care facility at the time of discharge due to feeling unsafe and that she is unable to receive the proper nursing care at home.  All questions and concerns were addressed at this time.  OBJECTIVE:  Vital Signs: Vitals:   05/31/24 2000 06/01/24 0413 06/01/24 0839 06/01/24 0941  BP: 123/60 (!) 101/46 (!) 119/55 (!) 119/55  Pulse: 85 72 71   Resp: 18 18 18    Temp: (!) 96.4 F (35.8 C) 98 F (36.7 C) 97.9 F (36.6 C)   TempSrc: Oral Oral Oral   SpO2: 100% 100% 100%   Weight:       Supplemental O2: Room Air SpO2: 100 %  Filed Weights   05/30/24 0513 05/30/24 0805 05/30/24 1130  Weight: 71.4 kg 70.4 kg 70.6 kg    No intake or output data in the 24 hours ending 06/01/24 1130  Net IO Since Admission: -352.42 mL [06/01/24 1130]  Physical Exam: Const: Awake, alert in NAD HENT: Normocephalic, atraumatic, mucus membranes moist Card: RRR, No MRG, No pitting edema on LE's bilaterally  Resp: LCTAB, no increased work of breathing Abd: Soft, NTND, bsx4, colostomy located in the left lower quadrant with no erythema or signs of surrounding infection. Extremities: Warm, right BKA.  Incision site dressed- clean dry and intact   Patient Lines/Drains/Airways Status     Active Line/Drains/Airways     Name Placement date Placement time Site Days   Peripheral IV 05/29/24 20 G Right  Antecubital 05/29/24  1032  Antecubital  1   Hemodialysis Catheter Left Internal jugular Double lumen Permanent (Tunneled) 04/23/24  1619  Internal jugular  37   Colostomy RUQ 11/02/23  1100  RUQ  210   Wound 04/20/24 1833 Surgical Closed Surgical Incision Knee Right 04/20/24  1833  Knee  40   Wound 05/29/24 2033 Pressure Injury Sacrum Medial Stage 4 - Full thickness tissue loss with exposed bone, tendon or muscle. 05/29/24  2033  Sacrum  1            Pertinent labs and imaging:      Latest Ref Rng & Units 06/01/2024    3:39 AM 05/29/2024    7:07 PM 05/29/2024   10:08 AM  CBC  WBC 4.0 - 10.5 K/uL 10.0  10.7  12.4   Hemoglobin 12.0 - 15.0 g/dL 9.0  8.7  8.5   Hematocrit 36.0 - 46.0 % 29.7  29.8  29.8   Platelets 150 - 400 K/uL 341  287  293        Latest Ref Rng & Units 06/01/2024    3:39 AM 05/29/2024    7:07 PM 05/29/2024   10:08 AM  CMP  Glucose 70 - 99 mg/dL 830  876  834   BUN 8 - 23 mg/dL 19  14  13    Creatinine 0.44 - 1.00 mg/dL 5.57  4.68  4.47   Sodium 135 - 145 mmol/L 137  139  141   Potassium 3.5 - 5.1 mmol/L 3.9  3.4  3.5   Chloride 98 - 111 mmol/L 99  103  105   CO2 22 - 32 mmol/L 26  26  24    Calcium  8.9 - 10.3 mg/dL 7.8  8.2  8.4     No results found.   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  No concerns for sepsis at this time -Albumin  <1.5 today. RD following to ensure proper nutrition to optimize wound healing.   -Bone culture preliminarily positive for proteus mirabilis  -Blood cultures NGTD -Infectious disease initiated Zosyn  yesterday for positive proteus mirabilis culture. Narrowed to unasyn today for g- and anaerobic coverage.  -Continue Vanc through 9/22 for MRSA bacteremia -Pain regimen adjusted today due to persistent pain: Dilaudid  1mg  q6, Fentanyl  25mcg IV q2 PRN -Continue wound care -q2 turns with air mattress for pressure offloading  #Status post right BKA with wound dehiscence -Staples and dressing placed in the ED. -Per Dr. Harden, no surgical intervention and to continue daily dressing changes with Vashe 4x4 gauze with ace wrap. He will continue to follow outpatient.  -No signs of cellulitis or acute wound infection at this time  #ESRD on HD -Nephrology following, appreciate their recommendations. -Continue MWF schedule  #Normocytic anemia #Iron  deficiency anemia #Anemia of chronic disease -Patient with chronic history of IDA likely secondary to multiple factors including iron  deficiency and chronic kidney disease. -Hemoglobin 9.0 today which does appear to be around her baseline.  Iron  studies obtained which showed a ferritin of 720 a total iron  of 14 and the rest was not able to be calculated.  Suspect increase in ferritin is likely reactive.   -Will hold off on IV iron  supplementation as this patient has an active infection requiring IV antibiotics  #Right internal jugular DVT - Last hospitalization (July) the patient was found to have a right internal jugular DVT.  At that time her [facility] was removed and replaced. Continue Eliquis  5 mg twice daily  #History of diverticulitis #Status post ex lap with colostomy  formation -Pt underwent exploratory laparotomy in February this year with subsequent colostomy formation. She is following with surgery outpatient.  -Colostomy in place and secure without signs of surrounding infection -Continue proper wound care  #Chronic A-fib Continue Eliquis  5 mg twice daily  #Type 2 diabetes melitis A1c 5.1 Glucose 123 this morning Will hold off on insulin  coverage at this time and continue to monitor closely  Best Practice: Diet: Regular diet VTE: eliquis  Code: DNR/DNI  Disposition planning: Therapy Recs: Pending, DME: other pending DISPO: Anticipated discharge pending to Skilled nursing facility pending clinical improvement.  Signature:  Darion Juhasz  Myrna, DO [facility]  11:30 AM, 06/01/2024  On Call pager [phone]

## 2024-06-01 NOTE — Progress Notes (Signed)
 Occupational Therapy Treatment Patient Details Name: [name] MRN: [id number] DOB: 06/12/1956 Today's Date: 06/01/2024   History of present illness 68 y.o. female [facility]d [facility] hospital on 05/29/2024 with R BKA wound dehiscence.PMHx: ESRD on HD, colostomy, T2DM, Afib, Rt ankle ORIF, CAD   OT comments  Pt is making steady progress towards their acute OT goals. Overall she needed CGA for STS with RW and min A to attempt hopping/pivoting to the R. Pt limited by dizziness/lightheadedness with STS. Of note, pt states she does not typically use a RW for [facility] transfers. OT to continue to follow acutely to facilitate progress towards established goals. Pt will benefit from [facility] for continued therapy and wound care.        If plan is discharge home, recommend the following:  A little help with walking and/or transfers;A little help with bathing/dressing/bathroom;Assistance with cooking/housework;Assist for transportation;Help with stairs or ramp for entrance   Equipment Recommendations  Hospital bed       Precautions / Restrictions Precautions Precautions: Fall Recall of Precautions/Restrictions: Intact Precaution/Restrictions Comments: R BKA Restrictions Weight Bearing Restrictions Per Provider Order: Yes RLE Weight Bearing Per Provider Order: Non weight bearing       Mobility Bed Mobility Overal bed mobility: Needs Assistance Bed Mobility: Supine to Sit, Sit to Supine   Sidelying to sit: Contact guard assist Supine to sit: Min assist          Transfers Overall transfer level: Needs assistance Equipment used: Rolling walker (2 wheels) Transfers: Sit to/from Stand Sit to Stand: Contact guard assist           General transfer comment: min A to attempt hopping to the R with RW     Balance Overall balance assessment: Needs assistance Sitting-balance support: No upper extremity supported, Feet supported Sitting balance-Leahy Scale: Good     Standing balance  support: Bilateral upper extremity supported, Reliant on assistive device for balance Standing balance-Leahy Scale: Poor                             ADL either performed or assessed with clinical judgement   ADL Overall ADL's : Needs assistance/impaired                         Toilet Transfer: Contact guard Games developer Details (indicate cue type and reason): also worked on [employer] with RW and full standing         Functional mobility during ADLs: Contact guard assist;Rolling walker (2 wheels) General ADL Comments: limited by lightheadedness with standing, pain and unsteady balance    Extremity/Trunk Assessment Upper Extremity Assessment Upper Extremity Assessment: Overall WFL for tasks assessed   Lower Extremity Assessment Lower Extremity Assessment: Defer to PT evaluation        Vision   Vision Assessment?: No apparent visual deficits   Perception Perception Perception: Not tested   Praxis Praxis Praxis: Not tested   Communication Communication Communication: No apparent difficulties   Cognition Arousal: Alert Behavior During Therapy: WFL for tasks assessed/performed Cognition: No apparent impairments                               Following commands: Intact        Cueing   Cueing Techniques: Verbal cues        General Comments pt reported dizziness with standing, no dynamap  in the room to assess BP    Pertinent Vitals/ Pain       Pain Assessment Pain Assessment: Faces Faces Pain Scale: Hurts little more Pain Location: R BKA & sacrum Pain Descriptors / Indicators: Sore Pain Intervention(s): Limited activity within patient's tolerance, Monitored during session   Frequency  Min 2X/week        Progress Toward Goals  OT Goals(current goals can now be found in the care plan section)  Progress towards OT goals: Progressing toward goals  Acute Rehab OT Goals Patient Stated Goal: to get  better OT Goal Formulation: With patient Time For Goal Achievement: 06/12/24 Potential to Achieve Goals: Good ADL Goals Pt Will Perform Lower Body Bathing: with modified independence Pt Will Perform Lower Body Dressing: with modified independence Pt Will Transfer to Toilet: with modified independence   AM-PAC OT 6 Clicks Daily Activity     Outcome Measure   Help from another person eating meals?: None Help from another person taking care of personal grooming?: A Little Help from another person toileting, which includes using toliet, bedpan, or urinal?: A Little Help from another person bathing (including washing, rinsing, drying)?: A Little Help from another person to put on and taking off regular upper body clothing?: A Little Help from another person to put on and taking off regular lower body clothing?: A Little 6 Click Score: 19    End of Session Equipment Utilized During Treatment: Gait belt  OT Visit Diagnosis: Unsteadiness on feet (R26.81);Other abnormalities of gait and mobility (R26.89);Muscle weakness (generalized) (M62.81)   Activity Tolerance Patient tolerated treatment well   Patient Left in bed   Nurse Communication Mobility status        Time: 8788-8769 OT Time Calculation (min): 19 min  Charges: OT General Charges $OT Visit: 1 Visit OT Treatments $Therapeutic Activity: 8-22 mins  [name], OTR/L Acute Rehabilitation Services Office [phone] Secure Chat Communication Preferred   [name] 06/01/2024, 1:05 PM

## 2024-06-01 NOTE — Care Management Important Message (Signed)
 Important Message  Patient Details  Name: [name] MRN: [id number] Date of Birth: [birth year]   Important Message Given:  Yes - Medicare IM     Jon Cruel 06/01/2024, 1:04 PM

## 2024-06-01 NOTE — Progress Notes (Signed)
 PHARMACY CONSULT NOTE FOR:  OUTPATIENT  PARENTERAL ANTIBIOTIC THERAPY with Dialysis   Indication: Sacral abscess and osteomyelitis  Regimen: Vancomycin  750 mg with HD MWF + Ceftazidime  1 gm with HD MWF End date: 06/15/24 for Vancomycin  07/09/24 for Ceftazidime    No formal OPAT will be done as patient will receive antibiotics with dialysis. An informational order only  will be placed for discharge .    Thank you for allowing pharmacy to be a part of this patient's care.  Damien Quiet, PharmD, BCPS, BCIDP Infectious Diseases Clinical Pharmacist Phone: [phone] 06/01/2024, 3:48 PM

## 2024-06-01 NOTE — Plan of Care (Signed)

## 2024-06-01 NOTE — Progress Notes (Signed)
 Contacted by pharmacy at this time regarding meds with HD.   Contacted out-pt HD clinic, [facility], and provided a heads up that pt will need to receive vanc and ceftazidime  with HD, dosage end dates were provided at this time. Also updated clinic that SNF placement pending and will notify once disposition and d/c date is known. Will continue to assist.   Lavanda Fredrickson Dialysis Navigator [phone]

## 2024-06-01 NOTE — Progress Notes (Signed)
   06/01/24 2009  Assess: MEWS Score  Temp (!) 97.5 F (36.4 C)  BP (!) 67/47  MAP (mmHg) (!) 53  Pulse Rate 77  Resp 20  Level of Consciousness Alert  SpO2 100 %  O2 Device Room Air  Assess: MEWS Score  MEWS Temp 0  MEWS Systolic 3  MEWS Pulse 0  MEWS RR 0  MEWS LOC 0  MEWS Score 3  MEWS Score Color Yellow  Assess: if the MEWS score is Yellow or Red  Were vital signs accurate and taken at a resting state? Yes  Does the patient meet 2 or more of the SIRS criteria? Yes  Does the patient have a confirmed or suspected source of infection? No  MEWS guidelines implemented  Yes, yellow  Treat  MEWS Interventions Considered administering scheduled or prn medications/treatments as ordered  Take Vital Signs  Increase Vital Sign Frequency  Yellow: Q2hr x1, continue Q4hrs until patient remains green for 12hrs  Escalate  MEWS: Escalate Yellow: Discuss with charge nurse and consider notifying provider and/or RRT  Notify: Charge Nurse/RN  Name of Charge Nurse/RN Notified Beverline, RN  Provider Notification  Provider Name/Title Ozell Nearing, MD  Date Provider Notified 06/01/24  Time Provider Notified 2000  Method of Notification Page  Notification Reason Other (Comment) (yellow mews)  Provider response See new orders;En route  Date of Provider Response 06/01/24  Time of Provider Response 2050  Assess: SIRS CRITERIA  SIRS Temperature  0  SIRS Respirations  0  SIRS Pulse 0  SIRS WBC 0  SIRS Score Sum  0

## 2024-06-02 ENCOUNTER — Encounter: Admitting: Family

## 2024-06-02 LAB — GLUCOSE, CAPILLARY
Glucose-Capillary: 113 mg/dL — ABNORMAL HIGH (ref 70–99)
Glucose-Capillary: 101 mg/dL — ABNORMAL HIGH (ref 70–99)
Glucose-Capillary: 152 mg/dL — ABNORMAL HIGH (ref 70–99)
Glucose-Capillary: 159 mg/dL — ABNORMAL HIGH (ref 70–99)
Glucose-Capillary: 220 mg/dL — ABNORMAL HIGH (ref 70–99)

## 2024-06-02 LAB — RENAL FUNCTION PANEL
Albumin: 1.5 g/dL — ABNORMAL LOW (ref 3.5–5.0)
Anion gap: 11 (ref 5–15)
BUN: 8 mg/dL (ref 8–23)
CO2: 28 mmol/L (ref 22–32)
Calcium: 7.5 mg/dL — ABNORMAL LOW (ref 8.9–10.3)
Chloride: 95 mmol/L — ABNORMAL LOW (ref 98–111)
Creatinine, Ser: 2.87 mg/dL — ABNORMAL HIGH (ref 0.44–1.00)
GFR, Estimated: 17 mL/min — ABNORMAL LOW (ref >60)
Glucose, Bld: 81 mg/dL (ref 70–99)
Phosphorus: 2.1 mg/dL — ABNORMAL LOW (ref 2.5–4.6)
Potassium: 3.3 mmol/L — ABNORMAL LOW (ref 3.5–5.1)
Sodium: 134 mmol/L — ABNORMAL LOW (ref 135–145)

## 2024-06-02 LAB — CBC
HCT: 29.5 % — ABNORMAL LOW (ref 36.0–46.0)
Hemoglobin: 8.9 g/dL — ABNORMAL LOW (ref 12.0–15.0)
MCH: 25.4 pg — ABNORMAL LOW (ref 26.0–34.0)
MCHC: 30.2 g/dL (ref 30.0–36.0)
MCV: 84 fL (ref 80.0–100.0)
Platelets: 325 K/uL (ref 150–400)
RBC: 3.51 MIL/uL — ABNORMAL LOW (ref 3.87–5.11)
RDW: 20.6 % — ABNORMAL HIGH (ref 11.5–15.5)
WBC: 12.9 K/uL — ABNORMAL HIGH (ref 4.0–10.5)
nRBC: 0 % (ref 0.0–0.2)

## 2024-06-02 LAB — HEPATITIS B SURFACE ANTIBODY, QUANTITATIVE: Hep B S AB Quant (Post): 5910 m[IU]/mL

## 2024-06-02 MED ORDER — VANCOMYCIN HCL 750 MG/150ML IV SOLN
750.0000 mg | Freq: Once | INTRAVENOUS | Status: AC
Start: 2024-06-02 — End: 2024-06-03
  Administered 2024-06-02: 750 mg via INTRAVENOUS
  Filled 2024-06-02: qty 150

## 2024-06-02 MED ORDER — JUVEN PO PACK
1.0000 | PACK | Freq: Two times a day (BID) | ORAL | Status: DC
  Administered 2024-06-02 – 2024-06-09 (×12): 1 via ORAL
  Filled 2024-06-02 (×12): qty 1

## 2024-06-02 MED ORDER — MIDODRINE HCL 5 MG PO TABS
2.5000 mg | ORAL_TABLET | Freq: Every day | ORAL | Status: DC | PRN
  Administered 2024-06-02 – 2024-06-08 (×3): 2.5 mg via ORAL
  Filled 2024-06-02 (×4): qty 1

## 2024-06-02 MED ORDER — LACTATED RINGERS IV BOLUS
500.0000 mL | Freq: Once | INTRAVENOUS | Status: AC
Start: 2024-06-02 — End: 2024-06-03
  Administered 2024-06-02: 500 mL via INTRAVENOUS

## 2024-06-02 MED ORDER — METOPROLOL TARTRATE 25 MG PO TABS
25.0000 mg | ORAL_TABLET | Freq: Two times a day (BID) | ORAL | Status: DC
  Administered 2024-06-03 – 2024-06-09 (×8): 25 mg via ORAL
  Filled 2024-06-02 (×11): qty 1

## 2024-06-02 MED ORDER — VANCOMYCIN VARIABLE DOSE PER UNSTABLE RENAL FUNCTION (PHARMACIST DOSING)
Status: DC
  Filled 2024-06-02 (×2): qty 1

## 2024-06-02 MED ORDER — BANATROL TF EN LIQD
60.0000 mL | Freq: Two times a day (BID) | ENTERAL | Status: DC
  Administered 2024-06-02 – 2024-06-09 (×14): 60 mL via ORAL
  Filled 2024-06-02 (×14): qty 60

## 2024-06-02 MED ORDER — ENSURE PLUS HIGH PROTEIN PO LIQD
237.0000 mL | Freq: Two times a day (BID) | ORAL | Status: DC
  Administered 2024-06-02 – 2024-06-07 (×5): 237 mL via ORAL

## 2024-06-02 NOTE — Progress Notes (Signed)
 HD#4 SUBJECTIVE:  Patient Summary: [name] is a 68 y.o. 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   Overnight Events: The patient become hypotensive with BP 74/40. She was given a 500ml Bolus with improvement of hypotension.   Interim History: The patient was seen and examined at the bedside this morning.  She reports that she was feeling better however did become hypotensive and slightly woozy last night.  She is still complaining of pain in her sacral region but says that she is comfortable with moment.  OBJECTIVE:  Vital Signs: Vitals:   06/02/24 0208 06/02/24 0509 06/02/24 0909 06/02/24 0946  BP: (!) 95/52 102/85 (!) 87/61 (!) 81/40  Pulse: 72 79 79 81  Resp: 17 18  17   Temp: (!) 97.4 F (36.3 C) 97.6 F (36.4 C)  97.9 F (36.6 C)  TempSrc: Oral Oral  Oral  SpO2: 100% 100%  100%  Weight:       Supplemental O2: Room Air SpO2: 100 %  Filed Weights   05/30/24 0513 05/30/24 0805 05/30/24 1130  Weight: 71.4 kg 70.4 kg 70.6 kg     Intake/Output Summary (Last 24 hours) at 06/02/2024 0951 Last data filed at 06/02/2024 0440 Gross per 24 hour  Intake 220 ml  Output 1900 ml  Net -1680 ml    Net IO Since Admission: -2,032.42 mL [06/02/24 0951]  Physical Exam: Const: Awake, alert in NAD HENT: Normocephalic, atraumatic, mucus membranes moist Card: RRR, No MRG, No pitting edema on LE's bilaterally  Resp: LCTAB, no increased work of breathing Abd: Soft, NTND, bsx4, colostomy located in the left lower quadrant with no erythema or signs of surrounding infection. Extremities: Warm. right BKA noted.  Incision site dressed- clean dry and intact   Patient Lines/Drains/Airways Status     Active Line/Drains/Airways     Name Placement date Placement time Site Days   Peripheral  IV 05/29/24 20 G Right Antecubital 05/29/24  1032  Antecubital  1   Hemodialysis Catheter Left Internal jugular Double lumen Permanent (Tunneled) 04/23/24  1619  Internal jugular  37   Colostomy RUQ 11/02/23  1100  RUQ  210   Wound 04/20/24 1833 Surgical Closed Surgical Incision Knee Right 04/20/24  1833  Knee  40   Wound 05/29/24 2033 Pressure Injury Sacrum Medial Stage 4 - Full thickness tissue loss with exposed bone, tendon or muscle. 05/29/24  2033  Sacrum  1            Pertinent labs and imaging:      Latest Ref Rng & Units 06/02/2024    2:54 AM 06/01/2024    3:39 AM 05/29/2024    7:07 PM  CBC  WBC 4.0 - 10.5 K/uL 12.9  10.0  10.7   Hemoglobin 12.0 - 15.0 g/dL 8.9  9.0  8.7   Hematocrit 36.0 - 46.0 % 29.5  29.7  29.8   Platelets 150 - 400 K/uL 325  341  287        Latest Ref Rng & Units 06/02/2024    2:54 AM 06/01/2024    3:39 AM 05/29/2024    7:07 PM  CMP  Glucose 70 - 99 mg/dL 81  830  876   BUN 8 - 23 mg/dL 8  19  14    Creatinine  0.44 - 1.00 mg/dL 7.12  5.57  5.31   Sodium 135 - 145 mmol/L 134  137  139   Potassium 3.5 - 5.1 mmol/L 3.3  3.9  3.4   Chloride 98 - 111 mmol/L 95  99  103   CO2 22 - 32 mmol/L 28  26  26    Calcium  8.9 - 10.3 mg/dL 7.5  7.8  8.2     No results found.   ASSESSMENT/PLAN:  Assessment: Principal Problem:   Osteomyelitis of vertebra, sacral and sacrococcygeal region [facility]) Active Problems:   Hypertension   Aortic atherosclerosis (HCC)   Chronic atrial fibrillation (HCC)   MRSA bacteremia   ESRD (end stage renal disease) (HCC)   Primary hypercoagulable state (HCC)   BKA stump complication (HCC)   Normocytic anemia   Does not feel safe at home   Colostomy in place [facility])   Diabetes mellitus with kidney complication, with long-term current use of insulin  (HCC)   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  No concerns for sepsis at this time -Albumin  1.5. RD following to ensure proper nutrition to optimize wound healing.   -Bone culture preliminarily positive for proteus mirabilis  -Blood cultures NGTD -Per ID, Ceftazidime  MWF with dialysis through 10/16 -Continue Vanc through 9/22 for MRSA bacteremia -Continue current pain regimen: Dilaudid  1mg  q6, Fentanyl  25mcg IV q2 PRN -Continue wound care -q2 turns with air mattress for pressure offloading  #Status post right BKA with wound dehiscence -Staples and dressing placed in the ED. -Per Dr. Harden, no surgical intervention and to continue daily dressing changes with Vashe 4x4 gauze with ace wrap. He will continue to follow outpatient.  -No signs of cellulitis or acute wound infection at this time  #Hypotension -PT has had multiple episodes of hypotensions after dialysis yessterday, persisting into this morning.  -Given bolus overnight with improvement in BP -81/40 this morning when getting up for breakfast.  -Will give 500ml bolus this morning -If hypotension continues, will dose home midodrine   #ESRD on HD -Nephrology following, appreciate their recommendations. -Continue MWF schedule  #Normocytic anemia #Iron  deficiency anemia #Anemia of chronic disease -Patient with chronic history of IDA likely secondary to multiple factors including iron  deficiency and chronic kidney disease. -Hgb stable at 8.9 this morning.  -Continue EPO per nephrology's recommendations  #Right internal jugular DVT - Last hospitalization (July) the patient was found to have a right internal jugular DVT.  At that time her [facility] was removed and replaced. Continue Eliquis  5 mg twice daily  #History of diverticulitis #Status post ex lap with colostomy formation -Pt underwent  exploratory laparotomy in February this year with subsequent colostomy formation. She is following with surgery outpatient.  -Colostomy in place and secure without signs of surrounding infection -Continue proper wound care  #Chronic A-fib -Continue Eliquis  5 mg twice daily  #Type 2 diabetes melitis -A1c 5.1 -Glucose 101 this AM -Will hold off on insulin  coverage at this time and continue to monitor closely  Best Practice: Diet: Regular diet VTE: eliquis  Code: DNR/DNI  Disposition planning: Therapy Recs: Pending, DME: other pending DISPO: Anticipated discharge pending to Skilled nursing facility pending clinical improvement.  Signature:  Schuyler Novak, DO [facility]  9:51 AM, 06/02/2024  On Call pager [phone]

## 2024-06-02 NOTE — Plan of Care (Signed)

## 2024-06-02 NOTE — Progress Notes (Signed)
 [facility] Progress Note   Subjective:   Seen and examined patient at bedside.  She reports feeling fine with no acute complaints. Had HD yesterday 1L UF. Awaiting SNF placement.   Objective Vitals:   06/01/24 2226 06/02/24 0105 06/02/24 0208 06/02/24 0509  BP: (!) 100/45 (!) 95/44 (!) 95/52 102/85  Pulse: 80 75 72 79  Resp: 18 18 17 18   Temp: 98 F (36.7 C) 98 F (36.7 C) (!) 97.4 F (36.3 C) 97.6 F (36.4 C)  TempSrc: Oral Oral Oral Oral  SpO2: 100% 100% 100% 100%  Weight:       Physical Exam General: Awake, alert, on RA, NAD Heart: S1 and S2; No murmurs, gallops, or rubs Lungs: Clear throughout Abdomen: Soft and non-tender - colostomy Extremities: No edema LLE; R BKA Dialysis Access: R [facility] c/d/i  Filed Weights   05/30/24 0513 05/30/24 0805 05/30/24 1130  Weight: 71.4 kg 70.4 kg 70.6 kg    Intake/Output Summary (Last 24 hours) at 06/02/2024 0847 Last data filed at 06/02/2024 0440 Gross per 24 hour  Intake 220 ml  Output 1900 ml  Net -1680 ml     Additional Objective Labs: Basic Metabolic Panel: Recent Labs  Lab 05/29/24 1907 06/01/24 0339 06/02/24 0254  NA 139 137 134*  K 3.4* 3.9 3.3*  CL 103 99 95*  CO2 26 26 28   GLUCOSE 123* 169* 81  BUN 14 19 8   CREATININE 4.68* 4.42* 2.87*  CALCIUM  8.2* 7.8* 7.5*  PHOS 2.9  2.8 3.9 2.1*   Liver Function Tests: Recent Labs  Lab 05/29/24 1907 06/01/24 0339 06/02/24 0254  ALBUMIN  1.9* <1.5* 1.5*   No results for input(s): LIPASE, AMYLASE in the last 168 hours. CBC: Recent Labs  Lab 05/29/24 1008 05/29/24 1907 06/01/24 0339 06/02/24 0254  WBC 12.4* 10.7* 10.0 12.9*  NEUTROABS 9.1* 8.7* 7.2  --   HGB 8.5* 8.7* 9.0* 8.9*  HCT 29.8* 29.8* 29.7* 29.5*  MCV 87.1 86.9 85.3 84.0  PLT 293 287 341 325   Blood Culture    Component Value Date/Time   SDES BONE 05/29/2024 1904   SPECREQUEST NONE 05/29/2024 1904   CULT  05/29/2024 1904    FEW PROTEUS MIRABILIS NO ANAEROBES ISOLATED;  CULTURE IN PROGRESS FOR 5 DAYS    REPTSTATUS PENDING 05/29/2024 1904    Cardiac Enzymes: No results for input(s): CKTOTAL, CKMB, CKMBINDEX, TROPONINI in the last 168 hours. CBG: Recent Labs  Lab 06/01/24 0838 06/01/24 1222 06/01/24 2223 06/02/24 0506 06/02/24 0757  GLUCAP 122* 144* 129* 113* 101*   Iron  Studies:  No results for input(s): IRON , TIBC, TRANSFERRIN, FERRITIN in the last 72 hours.  Lab Results  Component Value Date   INR 1.1 04/19/2024   INR >10.0 (HH) 10/28/2023   INR 1.32 02/13/2013   Studies/Results: No results found.   Medications:  cefTAZidime  (FORTAZ )  IV 1 g (06/01/24 1908)   vancomycin      vancomycin       acetaminophen   1,000 mg Oral Q6H   apixaban   5 mg Oral BID   ascorbic acid   500 mg Oral BID   brimonidine   1 drop Both Eyes BID   carvedilol   12.5 mg Oral BID WC   Chlorhexidine  Gluconate Cloth  6 each Topical Q0600   collagenase    Topical BID   darbepoetin (ARANESP ) injection - DIALYSIS  200 mcg Subcutaneous Q Sun-1800   feeding supplement  237 mL Oral TID BM   HYDROmorphone   1 mg Oral Q6H  latanoprost   1 drop Both Eyes QHS   multivitamin  1 tablet Oral QHS   vancomycin  variable dose per unstable renal function (pharmacist dosing)   Does not apply See admin instructions   zinc  sulfate (50mg  elemental zinc )  220 mg Oral Daily    Dialysis Orders: MWF- [facility] 4hrs, BFR 350, DFR Auto 1.5,  EDW 67.1kg, 3K/ 2.5Ca Heparin  2500 unit bolus with hD Mircera 225 mcg q2wks - last 05/15/24 Calcitriol  0.25mcg PO qHD - last dose 05/27/24   Assessment/Plan: R BKA wound dehiscence: repaired with staples and dressing in the ED 9/5. Wound care following Sacral decubitus ulcer: S/p wound debridement by Surgery PA 9/5. Surgery recommended ID consult. CT ABD/pelvis showed osteo. Per Primary, pt was discharged on 8/5 on IV vancomycin  with HD for MRSA bacteremia coverage by ID. Plan  to continue vancomycin  0.75g TIW until  06/15/2024 for a total of an 8-week course. Also started pip/Tazo > ceftaz as of 9/8 based on bone culture + proteus - continue ceftaz 1g TIW through 10/16 per ID ESRD -  on HD MWF. Next HD 9/10 - here or outpt ok.  Hypertension/volume  - BP acceptable. Not overloaded on exam. EDW recently lowered in outpatient Anemia of CKD - Hgb 8-9s, no IV iron  while getting IV abx for severe infection. ON ESA here - aranesp  200 given 9/7. Secondary Hyperparathyroidism -  Corr Ca is ok and phos on lower side. No binders for now. Nutrition - Renal diet with fluid restriction   Dipso plan is SNF on d/c.  Ok for d/c from nephrology perspective.   Manuelita Barters MD [facility] Pager [phone]

## 2024-06-02 NOTE — Progress Notes (Signed)
 Physical Therapy Wound Treatment Patient Details  Name: [name] MRN: [id number] Date of Birth: 1956/06/23  Today's Date: 06/02/2024 Time: 1445-1530 Time Calculation (min): 45 min  Subjective  Subjective Assessment Patient and Family Stated Goals: healed up Date of Onset:  (last admission) Prior Treatments: last and this present admission  Pain Score:  Pt reports 7/10 pain during treatment  Wound Assessment  Wound 05/29/24 2033 Pressure Injury Sacrum Medial Stage 4 - Full thickness tissue loss with exposed bone, tendon or muscle. (Active)  Wound Image   05/29/24 2033  Site / Wound Assessment Yellow;Red 06/02/24 1500  Peri-wound Assessment Erythema (blanchable) 06/02/24 1500  Wound Length (cm) 11 cm 05/30/24 1414  Wound Width (cm) 8.5 cm 05/30/24 1414  Wound Surface Area (cm^2) 73.43 cm^2 05/30/24 1414  Wound Depth (cm) 3 cm 05/30/24 1414  Wound Volume (cm^3) 146.869 cm^3 05/30/24 1414  Drainage Description Serosanguineous 06/02/24 1500  Drainage Amount Small 06/02/24 1500  Treatment Cleansed;Debridement (Selective);Irrigation;Other (Comment) 06/02/24 1500  Dressing Type Foam - Lift dressing to assess site every shift;Gauze (Comment);Santyl ;Other (Comment);Barrier Film (skin prep) 06/02/24 1500  Dressing Changed Changed 06/02/24 1500  Dressing Status New drainage;Intact 06/02/24 1500  State of Healing Early/partial granulation 06/02/24 1500  % Wound base Red or Granulating 30% 06/02/24 1500  % Wound base Yellow/Fibrinous Exudate 0% 06/02/24 1500  % Wound base Black/Eschar 0% 05/30/24 1414  % Wound base Other/Granulation Tissue (Comment) 70% 06/02/24 1500  Tunneling (cm) 6 05/29/24 2033  Undermining (cm) 12-3 o'clock 06/02/24 1500  Margins Unattached edges (unapproximated) 06/02/24 1500   Selective Debridement (non-excisional) Selective Debridement (non-excisional) - Location: sacral Selective Debridement (non-excisional) - Tools Used: Forceps, Scalpel,  Scissors Selective Debridement (non-excisional) - Tissue Removed: yellow eschar/slough some of which is friable    Wound Assessment and Plan  Wound Therapy - Assess/Plan/Recommendations Wound Therapy - Clinical Statement: Pt with good tolerance given that she is unable to have pain medication due to decreased BP. Granulation tissue that is visible looks red and beefy, anticipate that with removal of excess necrotic material, and continued nutrition, wound healing will begin. Wound Therapy - Functional Problem List: new BKA, presently participateing with acute OT/PT.  Slow and effortful mobility, CGA and rail to get to EOB. Factors Delaying/Impairing Wound Healing: Altered sensation, Diabetes Mellitus Hydrotherapy Plan: Debridement, Dressing change, Patient/family education Wound Therapy - Frequency: 2X / week Wound Therapy - Current Recommendations: PT Wound Therapy - Follow Up Recommendations: dressing changes by RN  Wound Therapy Goals- Improve the function of patient's integumentary system by progressing the wound(s) through the phases of wound healing (inflammation - proliferation - remodeling) by: Wound Therapy Goals - Improve the function of patient's integumentary system by progressing the wound(s) through the phases of wound healing by: Decrease Necrotic Tissue to: 20 Decrease Necrotic Tissue - Progress: Progressing toward goal Increase Granulation Tissue to: 80 Increase Granulation Tissue - Progress: Progressing toward goal Time For Goal Achievement: 7 days Wound Therapy - Potential for Goals: Good  Goals will be updated until maximal potential achieved or discharge criteria met.  Discharge criteria: when goals achieved, discharge from hospital, MD decision/surgical intervention, no progress towards goals, refusal/missing three consecutive treatments without notification or medical reason.  GP     Charges PT Wound Care Charges $Wound Debridement up to 20 cm: < or equal to 20  cm $ Wound Debridement each add'l 20 sqcm: 2 $PT Hydrotherapy Visit: 1 Visit      [name] PT, DPT Acute Rehabilitation Services Please  use secure chat or  Call Office [phone]  Almarie KATHEE Salinas [facility] 06/02/2024, 4:04 PM

## 2024-06-02 NOTE — Progress Notes (Addendum)
 Pharmacy Antibiotic Note  [name] is a 68 y.o. female admitted on 05/29/2024 with sacral wound osteomyelitis and MRSA bacteremia. Pharmacy has been consulted for Vancomycin  dosing.  Patient has been on outpatient vancomycin  regimen of 750mg  every Monday, Wednesday, and Friday with a planned stop date of 9/22 for MRSA bacteremia. Patient received HD yesterday which was consistent with her outpatient M/W/F schedule. Vancomycin  was not given after dialysis yesterday, so vancomycin  750mg  IV x1 was given this morning. Moving forward, vancomycin  750mg  will be given after dialysis sessions.  Plan: -Continue Vancomycin  750mg  IV M/W/F with HD -Collect vancomycin  lab at least weekly and more frequently as needed -Monitor clinical status   Weight: 70.6 kg (155 lb 10.3 oz)  Temp (24hrs), Avg:97.8 F (36.6 C), Min:97.4 F (36.3 C), Max:98.2 F (36.8 C)  Recent Labs  Lab 05/29/24 1008 05/29/24 1907 06/01/24 0339 06/02/24 0254  WBC 12.4* 10.7* 10.0 12.9*  CREATININE 4.47* 4.68* 4.42* 2.87*  VANCORANDOM  --  20  --   --     Estimated Creatinine Clearance: 18.2 mL/min (A) (by C-G formula based on SCr of 2.87 mg/dL (H)).    Allergies  Allergen Reactions   Nsaids Other (See Comments)    CKD stage 4   Lisinopril Other (See Comments)    coughing   Peanut-Containing Drug Products Itching and Other (See Comments)    GI intolerance - diarrhea    Antimicrobials this admission: Ceftazidime  9/8>> Vancomycin  9/6 >> (9/22) Zosyn  9/6 >>9/8   Microbiology results: 9/5 Bcx NGTD 4 days 9/5 Bone culture Proteus Mirabilis  Thank you for allowing pharmacy to be involved with this patient's care.  Mendel Barter, PharmD PGY1 Clinical Pharmacist [facility]  06/02/2024 2:42 PM

## 2024-06-02 NOTE — Progress Notes (Signed)
 Nutrition Follow-up  DOCUMENTATION CODES:   Not applicable  INTERVENTION:  Encourage PO intake - Currently on regular diet with thin liquids Smaller more frequent snacks Emphasis on Protein intake Ensure Plus High Protein po BID, each supplement provides 350 kcal and 20 grams of protein 1 packet Juven BID, each packet provides 95 calories, 2.5 grams of protein (collagen), to support wound healing Banatrol po BID-provides 45kcal, 5g soluble fiber and 2g protein per serving. Renal MVI daily 500 mg Vitamin C  BID 220 mg Zinc  sulfate daily x 14 days  Provided colostomy nutrition therapy education  Provided high calorie, high protein handout in AVS  -RD will draw labs to assess for potential micronutrient deficiencies which may impede wound healing: vitamin A (Pending)  NUTRITION DIAGNOSIS:   Increased nutrient needs related to wound healing as evidenced by estimated needs. - Ongoing   GOAL:   Patient will meet greater than or equal to 90% of their needs - Progressing   MONITOR:   PO intake, Supplement acceptance  REASON FOR ASSESSMENT:   Consult Assessment of nutrition requirement/status, Wound healing  ASSESSMENT:  Pt with PMH of HTN, CAD, a-fib, diabetes mellitus, ESRD on HD, Diabetic neuropathy, HLD, and diabetic retinopathy who presented with right lower extremity wound dehiscence and admitted for stage IV sacral decubitus ulcer   9/5- s/p bedside debridement of sacral pressure injury by general surgery, ortho placed staple on rt BKA area of dehiscence  9/6- hydrotherapy started  Pt has experienced several serious illnesses in the past 7 months, presenting initially for evaluation of accidental traumatic dehiscence of her 04/20/24 R BKA surgical wound but found to have a large stage 4 sacral pressure wound complicated by chronic sacral osteomyelitis.  She  experienced a severe diverticulitis illness in 10/2023; she underwent partial colectomy and end colostomy, and became  HD-dependent at that time. In July she was hospitalized for management of  sepsis due to necrotyzing faciitis which resulted in R BKA.   From the hospital she was discharged to SNF and has very recently returned home though does not have the DME and resource to thrive, nor is she able to get around independently (her sons have had to carry her from room to room).   Pt in good spirits on visit, is motivated to get better and walk again. Denies recent weight loss, has an appetite but is scared to eat due to high ostomy output. Recently discharged from SNF to home. Pt reports she receives outpatient HD MWF. On HD days she only eats 1 meal per day. On non HD days she eats 2-3 meals and snacks throughout the day. Typically eats chili, pasta, casseroles, hot dogs, and chips. Does not take supplements at home. Has been bed bound.   Provided colostomy education and ways to slow output via diet. Pt agreeable to starting Banatrol. Encouraged increased PO intake with smaller, more frequent meals and snacks and to focus on increased  protein intake for wound healing/  Pt has been drinking at least 1 Ensure per day here. Encouraged pt to drink 2 Enusres per day. (Pt does not like Glucerna). Willing to try Juven. Pt ahas been eating 50-75% of her meals here. Had 50% of an omelet, 100% of potatoes and bacon this morning.   Last HD yesterday, 1 L UF.   Disposition: SNF/LTAC  Admit weight: 71.4 kg Current weight: 70.6 kg   EDW:  67.1 kg  Average Meal Intake: 9/8-9/9: 50-75% intake x 2 recorded meals  Nutritionally Relevant Medications:  Scheduled Meds:  ascorbic acid   500 mg Oral BID   feeding supplement  237 mL Oral BID BM   fiber supplement (BANATROL TF)  60 mL Oral BID   multivitamin  1 tablet Oral QHS   nutrition supplement (JUVEN)  1 packet Oral BID BM   zinc  sulfate (50mg  elemental zinc )  220 mg Oral Daily   Continuous Infusions:  cefTAZidime  (FORTAZ )  IV 1 g (06/01/24 1908)   vancomycin      Labs  Reviewed: Sodium 134 Potassium 3.3 Creatinine 2.87 Calcium  7.5 Phosphorus 2.1 GFR 17 CBG ranges from 101-152 mg/dL over the last 24 hours HgbA1c 5.1  NUTRITION - FOCUSED PHYSICAL EXAM:  Flowsheet Row Most Recent Value  Orbital Region No depletion  Upper Arm Region No depletion  Thoracic and Lumbar Region No depletion  Buccal Region No depletion  Temple Region No depletion  Clavicle Bone Region No depletion  Clavicle and Acromion Bone Region No depletion  Scapular Bone Region Mild depletion  Dorsal Hand Mild depletion  Patellar Region Severe depletion  Anterior Thigh Region Severe depletion  Posterior Calf Region Severe depletion  Edema (RD Assessment) Mild  Hair Reviewed  Eyes Reviewed  Mouth Reviewed  Skin Reviewed  Nails Reviewed    Diet Order:   Diet Order             Diet regular Room service appropriate? Yes; Fluid consistency: Thin  Diet effective now                   EDUCATION NEEDS:   No education needs have been identified at this time  Skin:  Skin Assessment: Skin Integrity Issues: Skin Integrity Issues:: Stage IV Stage IV: sacrum  Last BM:  05/28/24 (via colostomy)  Height:   Ht Readings from Last 1 Encounters:  05/06/24 5' 7 (1.702 m)    Weight:   Wt Readings from Last 1 Encounters:  05/30/24 70.6 kg    Ideal Body Weight:  57.4 kg (adjusted for rt BKA)  BMI:  Body mass index is 24.38 kg/m.  Estimated Nutritional Needs:   Kcal:  2000-2200  Protein:  100-115 grams  Fluid:  1000 ml + UOP   Olivia Kenning, RD Registered Dietitian  See Amion for more information

## 2024-06-03 DIAGNOSIS — E43 Unspecified severe protein-calorie malnutrition: Secondary | ICD-10-CM

## 2024-06-03 LAB — RENAL FUNCTION PANEL
Albumin: 1.8 g/dL — ABNORMAL LOW (ref 3.5–5.0)
Anion gap: 12 (ref 5–15)
BUN: 19 mg/dL (ref 8–23)
CO2: 24 mmol/L (ref 22–32)
Calcium: 7.9 mg/dL — ABNORMAL LOW (ref 8.9–10.3)
Chloride: 100 mmol/L (ref 98–111)
Creatinine, Ser: 4.37 mg/dL — ABNORMAL HIGH (ref 0.44–1.00)
GFR, Estimated: 10 mL/min — ABNORMAL LOW (ref >60)
Glucose, Bld: 172 mg/dL — ABNORMAL HIGH (ref 70–99)
Phosphorus: 1.9 mg/dL — ABNORMAL LOW (ref 2.5–4.6)
Potassium: 3.9 mmol/L (ref 3.5–5.1)
Sodium: 136 mmol/L (ref 135–145)

## 2024-06-03 LAB — CBC
HCT: 29.2 % — ABNORMAL LOW (ref 36.0–46.0)
Hemoglobin: 8.8 g/dL — ABNORMAL LOW (ref 12.0–15.0)
MCH: 25.6 pg — ABNORMAL LOW (ref 26.0–34.0)
MCHC: 30.1 g/dL (ref 30.0–36.0)
MCV: 84.9 fL (ref 80.0–100.0)
Platelets: 407 K/uL — ABNORMAL HIGH (ref 150–400)
RBC: 3.44 MIL/uL — ABNORMAL LOW (ref 3.87–5.11)
RDW: 20.7 % — ABNORMAL HIGH (ref 11.5–15.5)
WBC: 12.3 K/uL — ABNORMAL HIGH (ref 4.0–10.5)
nRBC: 0 % (ref 0.0–0.2)

## 2024-06-03 LAB — CULTURE, BLOOD (ROUTINE X 2)
Culture: NO GROWTH
Culture: NO GROWTH
Special Requests: ADEQUATE

## 2024-06-03 LAB — GLUCOSE, CAPILLARY
Glucose-Capillary: 165 mg/dL — ABNORMAL HIGH (ref 70–99)
Glucose-Capillary: 158 mg/dL — ABNORMAL HIGH (ref 70–99)

## 2024-06-03 MED ORDER — HYDROMORPHONE HCL 2 MG PO TABS
1.0000 mg | ORAL_TABLET | Freq: Four times a day (QID) | ORAL | Status: DC | PRN
  Administered 2024-06-03 – 2024-06-04 (×3): 1 mg via ORAL
  Filled 2024-06-03 (×3): qty 1

## 2024-06-03 MED ORDER — VANCOMYCIN HCL 750 MG/150ML IV SOLN
750.0000 mg | INTRAVENOUS | Status: DC
  Administered 2024-06-03: 750 mg via INTRAVENOUS
  Filled 2024-06-03 (×2): qty 150

## 2024-06-03 NOTE — Progress Notes (Signed)
   06/03/24 1213  Vitals  Temp 98.3 F (36.8 C) (Simultaneous filing. User may not have seen previous data.)  Pulse Rate 84  Resp 14  BP (!) 147/69  SpO2 100 %  O2 Device Room Air  Weight  (unable to get weight due to bed type)  Post Treatment  Dialyzer Clearance Lightly streaked  Hemodialysis Intake (mL) 0 mL  Liters Processed 84  Fluid Removed (mL) 500 mL  Tolerated HD Treatment Yes   Received patient in bed to unit.  Alert and oriented.  Informed consent signed and in chart.   TX duration:3.5HRS  Patient tolerated well.  Transported back to the room  Alert, without acute distress.  Hand-off given to patient's nurse.   Access used: [facility] Access issues: NONE  Total UF removed: Medication(s) given: NONE    [facility]

## 2024-06-03 NOTE — Progress Notes (Addendum)
 Received a call from dialysis unit and gave report to Zebedee Mace for the patient is scheduled for dialysis. Clarified the administration of the vancomycin  and the staff replied that their educator in the dialysis gave instructions that the 6N unit would give the medication and not the dialysis unit. Clarified when to give the medication and the staff specifically said that the vancomycin  will be given after dialysis.

## 2024-06-03 NOTE — Progress Notes (Signed)
 Clarified to pharmacy staff about the administration of vancomycin  and the pharmacy on duty instructed me that the dialysis unit will administer the dialysis. I relayed to the pharmacy staff about the instruction of dialysis that the vancomycin  will be given in the unit but insisted that dialysis will administer the medication.

## 2024-06-03 NOTE — Progress Notes (Signed)
 PT Cancellation Note  Patient Details Name: [name] MRN: [id number] DOB: 09/08/1956   Cancelled Treatment:    Reason Eval/Treat Not Completed: (P) Patient at procedure or test/unavailable, pt off unit at HD. Will check back as schedule allows to continue with PT POC.  Therisa SAUNDERS. PTA Acute Rehabilitation Services Office: [phone]    Therisa CHRISTELLA Boor 06/03/2024, 11:14 AM

## 2024-06-03 NOTE — Plan of Care (Signed)

## 2024-06-03 NOTE — Progress Notes (Signed)
 [facility] Progress Note   Subjective:   Seen and examined patient at dialysis.  She required small bolus yesterday for low BP in setting of HD UF 1L + ostomy outpt.  She reports feeling fine with no acute complaints. Awaiting SNF or LTACH placement.   Objective Vitals:   06/03/24 0045 06/03/24 0440 06/03/24 0807 06/03/24 0820  BP: (!) 93/46 (!) 108/52  (!) 131/58  Pulse: 82 84  79  Resp: 18 16    Temp: 98.6 F (37 C) 98.7 F (37.1 C)    TempSrc: Oral Oral    SpO2: 99% 100%  99%  Weight:      Height:   5' 7 (1.702 m)    Physical Exam General: Awake, alert, on RA, NAD Heart: S1 and S2; No murmurs, gallops, or rubs Lungs: Clear throughout Abdomen: Soft and non-tender - colostomy Extremities: No edema LLE; R BKA Dialysis Access: R [facility] c/d/i  Filed Weights   05/30/24 0513 05/30/24 0805 05/30/24 1130  Weight: 71.4 kg 70.4 kg 70.6 kg    Intake/Output Summary (Last 24 hours) at 06/03/2024 0833 Last data filed at 06/02/2024 2005 Gross per 24 hour  Intake 560 ml  Output 750 ml  Net -190 ml     Additional Objective Labs: Basic Metabolic Panel: Recent Labs  Lab 05/29/24 1907 06/01/24 0339 06/02/24 0254  NA 139 137 134*  K 3.4* 3.9 3.3*  CL 103 99 95*  CO2 26 26 28   GLUCOSE 123* 169* 81  BUN 14 19 8   CREATININE 4.68* 4.42* 2.87*  CALCIUM  8.2* 7.8* 7.5*  PHOS 2.9  2.8 3.9 2.1*   Liver Function Tests: Recent Labs  Lab 05/29/24 1907 06/01/24 0339 06/02/24 0254  ALBUMIN  1.9* <1.5* 1.5*   No results for input(s): LIPASE, AMYLASE in the last 168 hours. CBC: Recent Labs  Lab 05/29/24 1008 05/29/24 1907 06/01/24 0339 06/02/24 0254  WBC 12.4* 10.7* 10.0 12.9*  NEUTROABS 9.1* 8.7* 7.2  --   HGB 8.5* 8.7* 9.0* 8.9*  HCT 29.8* 29.8* 29.7* 29.5*  MCV 87.1 86.9 85.3 84.0  PLT 293 287 341 325   Blood Culture    Component Value Date/Time   SDES BONE 05/29/2024 1904   SPECREQUEST NONE 05/29/2024 1904   CULT  05/29/2024 1904    FEW  PROTEUS MIRABILIS NO ANAEROBES ISOLATED; CULTURE IN PROGRESS FOR 5 DAYS    REPTSTATUS PENDING 05/29/2024 1904    Cardiac Enzymes: No results for input(s): CKTOTAL, CKMB, CKMBINDEX, TROPONINI in the last 168 hours. CBG: Recent Labs  Lab 06/02/24 0506 06/02/24 0757 06/02/24 1142 06/02/24 1654 06/02/24 2033  GLUCAP 113* 101* 152* 159* 220*   Iron  Studies:  No results for input(s): IRON , TIBC, TRANSFERRIN, FERRITIN in the last 72 hours.  Lab Results  Component Value Date   INR 1.1 04/19/2024   INR >10.0 (HH) 10/28/2023   INR 1.32 02/13/2013   Studies/Results: No results found.   Medications:  cefTAZidime  (FORTAZ )  IV 1 g (06/01/24 1908)   vancomycin       acetaminophen   1,000 mg Oral Q6H   apixaban   5 mg Oral BID   ascorbic acid   500 mg Oral BID   brimonidine   1 drop Both Eyes BID   Chlorhexidine  Gluconate Cloth  6 each Topical Q0600   collagenase    Topical BID   darbepoetin (ARANESP ) injection - DIALYSIS  200 mcg Subcutaneous Q Sun-1800   feeding supplement  237 mL Oral BID BM   fiber supplement (BANATROL TF)  60 mL Oral BID   HYDROmorphone   1 mg Oral Q6H   latanoprost   1 drop Both Eyes QHS   metoprolol  tartrate  25 mg Oral BID   multivitamin  1 tablet Oral QHS   nutrition supplement (JUVEN)  1 packet Oral BID BM   vancomycin  variable dose per unstable renal function (pharmacist dosing)   Does not apply See admin instructions   zinc  sulfate (50mg  elemental zinc )  220 mg Oral Daily    Dialysis Orders: MWF- [facility] 4hrs, BFR 350, DFR Auto 1.5,  EDW 67.1kg, 3K/ 2.5Ca Heparin  2500 unit bolus with hD Mircera 225 mcg q2wks - last 05/15/24 Calcitriol  0.25mcg PO qHD - last dose 05/27/24   Assessment/Plan: R BKA wound dehiscence: repaired with staples and dressing in the ED 9/5. Wound care following Sacral decubitus ulcer: S/p wound debridement by Surgery PA 9/5. Surgery recommended ID consult. CT ABD/pelvis showed osteo. Per  Primary, pt was discharged on 8/5 on IV vancomycin  with HD for MRSA bacteremia coverage by ID. Plan  to continue vancomycin  0.75g TIW until 06/15/2024 for a total of an 8-week course. Also started pip/Tazo > ceftaz as of 9/8 based on bone culture + proteus - continue ceftaz 1g TIW through 10/16 per ID ESRD -  on HD MWF. Next HD 9/10 - here or outpt ok.  Hypertension/volume  - BP acceptable. Not overloaded on exam. EDW recently lowered in outpatient. [place] UF today 0.5L.  Anemia of CKD - Hgb 8-9s, no IV iron  while getting IV abx for severe infection. ON ESA here - aranesp  200 given 9/7. Secondary Hyperparathyroidism -  Corr Ca is ok and phos on lower side. No binders for now. Nutrition - Renal diet with fluid restriction   Dipso plan is SNF/LTACH on d/c.  Ok for d/c from nephrology perspective.   Manuelita Barters MD [facility] Pager [phone]

## 2024-06-03 NOTE — Progress Notes (Signed)
 Patient allowed the staples that were loose to be removed. Dressing change done

## 2024-06-03 NOTE — Progress Notes (Signed)
 Received instructions from Dr. Michael Zheng about the midodrine  to be given tomorrow morning if 4am vital signs is very low. No midodrine  to be given at this moment because a dose was already given today. Reiterated not to give the metoprolol  for tonight.

## 2024-06-03 NOTE — Progress Notes (Signed)
 Spoke with Dr. Sharyl primary MD---about the policy for antibiotics to be given after HD on the floor for pts that have IV access per [facility] policy Applicaple to this pt

## 2024-06-03 NOTE — Progress Notes (Signed)
 Patient blood pressure is 95/53 notified the IM on call Dr. Sallyanne Primas and clarified if metoprolol  to be given. Received a reply to hold the metoprolol  and monitor the blood pressure.

## 2024-06-03 NOTE — Progress Notes (Signed)
 Patient is refusing to have staples removed. States that the wound will just open up.  Very upset that she is not getting her pain medication scheduled.  She states that she had not been able to get pain medication because her blood pressure had been so low.

## 2024-06-03 NOTE — Progress Notes (Signed)
 Updated the IM on call about the blood pressure of the patient 93/46 and received a call from Dr. Michael Zheng and instructed to monitor the patient and not to give the dilaudid . Notified the doctor of the patient's refusal to take tylenol .

## 2024-06-03 NOTE — Progress Notes (Signed)
 HD#5 SUBJECTIVE:  Patient Summary: [name] is a 68 y.o. 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   Overnight Events: No acute events overnight  Interim History: The patient was seen and examined at the bedside this morning in dialysis. She reports that she is feeling much better however is feeling particularly drowsy today. She does report that her colostomy output has been  more watery and increased over the past few days. Denies abdominal pain, fever, and chills.  All questions and concerns were addressed at this time.  OBJECTIVE:  Vital Signs: Vitals:   06/03/24 0900 06/03/24 0930 06/03/24 1000 06/03/24 1030  BP: 132/65 (!) 147/73 (!) 163/73 133/70  Pulse: 76 79 79 81  Resp: 16 12 14 15   Temp:      TempSrc:      SpO2: 100% 100% 100% 100%  Weight:      Height:       Supplemental O2: Room Air SpO2: 100 % O2 Flow Rate (L/min): 2 L/min  Filed Weights   05/30/24 0513 05/30/24 0805 05/30/24 1130  Weight: 71.4 kg 70.4 kg 70.6 kg     Intake/Output Summary (Last 24 hours) at 06/03/2024 1053 Last data filed at 06/03/2024 0700 Gross per 24 hour  Intake 340 ml  Output 600 ml  Net -260 ml    Net IO Since Admission: -2,267.42 mL [06/03/24 1053]  Physical Exam: Const: Awake, alert in NAD HENT: Normocephalic, atraumatic, mucus membranes moist Card: RRR, No MRG, No pitting edema on LE's bilaterally  Resp: LCTAB, no increased work of breathing Abd: Soft, NTND, bsx4, colostomy located in the left lower quadrant with no erythema or signs of surrounding infection. Extremities: Warm. right BKA noted.  Incision site dressed- clean dry and intact   Patient Lines/Drains/Airways Status     Active Line/Drains/Airways     Name Placement date Placement time Site Days   Peripheral  IV 05/29/24 20 G Right Antecubital 05/29/24  1032  Antecubital  1   Hemodialysis Catheter Left Internal jugular Double lumen Permanent (Tunneled) 04/23/24  1619  Internal jugular  37   Colostomy RUQ 11/02/23  1100  RUQ  210   Wound 04/20/24 1833 Surgical Closed Surgical Incision Knee Right 04/20/24  1833  Knee  40   Wound 05/29/24 2033 Pressure Injury Sacrum Medial Stage 4 - Full thickness tissue loss with exposed bone, tendon or muscle. 05/29/24  2033  Sacrum  1            Pertinent labs and imaging:      Latest Ref Rng & Units 06/03/2024    8:37 AM 06/02/2024    2:54 AM 06/01/2024    3:39 AM  CBC  WBC 4.0 - 10.5 K/uL 12.3  12.9  10.0   Hemoglobin 12.0 - 15.0 g/dL 8.8  8.9  9.0   Hematocrit 36.0 - 46.0 % 29.2  29.5  29.7   Platelets 150 - 400 K/uL 407  325  341        Latest Ref Rng & Units 06/03/2024    8:37 AM 06/02/2024    2:54 AM 06/01/2024    3:39 AM  CMP  Glucose 70 - 99 mg/dL 827  81  830   BUN 8 - 23 mg/dL 19  8  19  Creatinine 0.44 - 1.00 mg/dL 5.62  7.12  5.57   Sodium 135 - 145 mmol/L 136  134  137   Potassium 3.5 - 5.1 mmol/L 3.9  3.3  3.9   Chloride 98 - 111 mmol/L 100  95  99   CO2 22 - 32 mmol/L 24  28  26    Calcium  8.9 - 10.3 mg/dL 7.9  7.5  7.8     No results found.   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  No concerns for sepsis at this time -Albumin  1.5. RD following to ensure proper nutrition to optimize wound healing.   -Bone culture preliminarily positive for proteus mirabilis  -Blood cultures NGTD -Per ID, Ceftazidime  MWF with dialysis through 10/16 -Continue Vanc through 9/22 for MRSA bacteremia -Pt not requiring PRN IV fentanyl , will discontinue at this time. Continue dilaudid  q6 PRN -Continue wound care -q2 turns with air mattress for pressure offloading  #Status post right BKA with wound dehiscence -Staples and dressing placed in the ED. -Per Dr. Harden, no surgical intervention and to continue daily dressing changes with Vashe 4x4 gauze with ace wrap. He will continue to follow outpatient.  -The patient reports increasing pain and drainage over the last two days, will touch base with surgery today.   #Hypotension -Pt has had multiple episodes of hypotension over the last 24 hours.  -Transient improvement with fluids yesterday. Suspect increasing ostomy output with dialysis is contributing. -Midodrine  daily if SBP <90 and pt symptomatic  #ESRD on HD -Nephrology following, appreciate their recommendations. -Continue MWF schedule  #Normocytic anemia #Iron  deficiency anemia #Anemia of chronic disease -Patient with chronic history of IDA likely secondary to multiple factors including iron  deficiency and chronic kidney disease. -Hgb stable -Continue EPO per nephrology's recommendations  #Right internal jugular DVT - Last hospitalization (July) the patient was found to have a right internal jugular DVT.  At that time her [facility] was removed and replaced. Continue Eliquis  5 mg twice daily  #History of diverticulitis #Status post ex lap with colostomy formation -Pt underwent exploratory laparotomy in  February this year with subsequent colostomy formation. She is following with surgery outpatient.  -Colostomy in place and secure without signs of surrounding infection -Colostomy output has increased over the last 2 days, pt denying abdominal pain, cramps, fever, or chills -Continue proper wound care  #Chronic A-fib -Continue Eliquis  5 mg twice daily  #Type 2 diabetes melitis -A1c 5.1 -Will hold off on insulin  coverage at this time and continue to monitor closely  Best Practice: Diet: Regular diet VTE: eliquis  Code: DNR/DNI  Disposition planning: Therapy Recs: Pending, DME: other pending DISPO: Anticipated discharge pending to Skilled nursing facility pending clinical improvement.  Signature:  Schuyler Novak, DO [facility]  10:53 AM, 06/03/2024  On Call pager [phone]

## 2024-06-04 DIAGNOSIS — T8130XA Disruption of wound, unspecified, initial encounter: Principal | ICD-10-CM

## 2024-06-04 LAB — CBC
HCT: 27.4 % — ABNORMAL LOW (ref 36.0–46.0)
Hemoglobin: 8.3 g/dL — ABNORMAL LOW (ref 12.0–15.0)
MCH: 25.6 pg — ABNORMAL LOW (ref 26.0–34.0)
MCHC: 30.3 g/dL (ref 30.0–36.0)
MCV: 84.6 fL (ref 80.0–100.0)
Platelets: 319 K/uL (ref 150–400)
RBC: 3.24 MIL/uL — ABNORMAL LOW (ref 3.87–5.11)
RDW: 21 % — ABNORMAL HIGH (ref 11.5–15.5)
WBC: 11.4 K/uL — ABNORMAL HIGH (ref 4.0–10.5)
nRBC: 0 % (ref 0.0–0.2)

## 2024-06-04 LAB — GLUCOSE, CAPILLARY
Glucose-Capillary: 162 mg/dL — ABNORMAL HIGH (ref 70–99)
Glucose-Capillary: 165 mg/dL — ABNORMAL HIGH (ref 70–99)
Glucose-Capillary: 149 mg/dL — ABNORMAL HIGH (ref 70–99)

## 2024-06-04 MED ORDER — HYDROMORPHONE HCL 2 MG PO TABS
1.0000 mg | ORAL_TABLET | Freq: Four times a day (QID) | ORAL | Status: DC | PRN
  Administered 2024-06-04 – 2024-06-09 (×10): 1 mg via ORAL
  Filled 2024-06-04 (×11): qty 1

## 2024-06-04 MED ORDER — ACETAMINOPHEN 500 MG PO TABS
1000.0000 mg | ORAL_TABLET | Freq: Four times a day (QID) | ORAL | Status: DC
  Administered 2024-06-04 – 2024-06-09 (×17): 1000 mg via ORAL
  Filled 2024-06-04 (×18): qty 2

## 2024-06-04 NOTE — Plan of Care (Signed)
  Problem: Education: Goal: Ability to describe self-care measures that may prevent or decrease complications (Diabetes Survival Skills Education) will improve Outcome: Progressing   Problem: Coping: Goal: Ability to adjust to condition or change in health will improve Outcome: Progressing   Problem: Skin Integrity: Goal: Risk for impaired skin integrity will decrease Outcome: Progressing   Problem: Tissue Perfusion: Goal: Adequacy of tissue perfusion will improve Outcome: Progressing

## 2024-06-04 NOTE — Progress Notes (Signed)
 [facility] Progress Note   Subjective:   Seen and examined patient in room.  HD was fine yesterday, UF 0.5L as planned. She reports feeling fine with no acute complaints. Awaiting SNF or LTACH placement.   Objective Vitals:   06/03/24 1544 06/03/24 2108 06/04/24 0102 06/04/24 0447  BP: 107/60 110/62 (!) 108/55 (!) 122/57  Pulse: 91 84 71 72  Resp: 20 20 16 18   Temp: 98.1 F (36.7 C) 98 F (36.7 C) 98.3 F (36.8 C) 98.1 F (36.7 C)  TempSrc: Oral Oral Oral Oral  SpO2: 100% 100% 99% 100%  Weight:      Height:       Physical Exam General: Awake, alert, on RA, NAD Heart: S1 and S2; No murmurs, gallops, or rubs Lungs: Clear throughout Extremities: No edema LLE; R BKA Dialysis Access: R [facility] c/d/i  [facility] Weights   05/30/24 0513 05/30/24 0805 05/30/24 1130  Weight: 71.4 kg 70.4 kg 70.6 kg    Intake/Output Summary (Last 24 hours) at 06/04/2024 0843 Last data filed at 06/04/2024 0443 Gross per 24 hour  Intake 856.38 ml  Output 500 ml  Net 356.38 ml     Additional Objective Labs: Basic Metabolic Panel: Recent Labs  Lab 06/01/24 0339 06/02/24 0254 06/03/24 0837  NA 137 134* 136  K 3.9 3.3* 3.9  CL 99 95* 100  CO2 26 28 24   GLUCOSE 169* 81 172*  BUN 19 8 19   CREATININE 4.42* 2.87* 4.37*  CALCIUM  7.8* 7.5* 7.9*  PHOS 3.9 2.1* 1.9*   Liver Function Tests: Recent Labs  Lab 06/01/24 0339 06/02/24 0254 06/03/24 0837  ALBUMIN  <1.5* 1.5* 1.8*   No results for input(s): LIPASE, AMYLASE in the last 168 hours. CBC: Recent Labs  Lab 05/29/24 1008 05/29/24 1907 06/01/24 0339 06/02/24 0254 06/03/24 0837 06/04/24 0400  WBC 12.4* 10.7* 10.0 12.9* 12.3* 11.4*  NEUTROABS 9.1* 8.7* 7.2  --   --   --   HGB 8.5* 8.7* 9.0* 8.9* 8.8* 8.3*  HCT 29.8* 29.8* 29.7* 29.5* 29.2* 27.4*  MCV 87.1 86.9 85.3 84.0 84.9 84.6  PLT 293 287 341 325 407* 319   Blood Culture    Component Value Date/Time   SDES BONE 05/29/2024 1904   SPECREQUEST NONE 05/29/2024  1904   CULT  05/29/2024 1904    FEW PROTEUS MIRABILIS HOLDING FOR POSSIBLE ANAEROBE Performed at [facility], [address] [place], [place] [address]    REPTSTATUS PENDING 05/29/2024 1904    Cardiac Enzymes: No results for input(s): CKTOTAL, CKMB, CKMBINDEX, TROPONINI in the last 168 hours. CBG: Recent Labs  Lab 06/02/24 1142 06/02/24 1654 06/02/24 2033 06/03/24 1653 06/03/24 2103  GLUCAP 152* 159* 220* 165* 158*   Iron  Studies:  No results for input(s): IRON , TIBC, TRANSFERRIN, FERRITIN in the last 72 hours.  Lab Results  Component Value Date   INR 1.1 04/19/2024   INR >10.0 (HH) 10/28/2023   INR 1.32 02/13/2013   Studies/Results: No results found.   Medications:  cefTAZidime  (FORTAZ )  IV 1 g (06/03/24 1450)   vancomycin  750 mg (06/03/24 1319)    acetaminophen   1,000 mg Oral Q6H   apixaban   5 mg Oral BID   ascorbic acid   500 mg Oral BID   brimonidine   1 drop Both Eyes BID   Chlorhexidine  Gluconate Cloth  6 each Topical Q0600   collagenase    Topical BID   darbepoetin (ARANESP ) injection - DIALYSIS  200 mcg Subcutaneous Q Sun-1800  feeding supplement  237 mL Oral BID BM   fiber supplement (BANATROL TF)  60 mL Oral BID   latanoprost   1 drop Both Eyes QHS   metoprolol  tartrate  25 mg Oral BID   multivitamin  1 tablet Oral QHS   nutrition supplement (JUVEN)  1 packet Oral BID BM   vancomycin  variable dose per unstable renal function (pharmacist dosing)   Does not apply See admin instructions   zinc  sulfate (50mg  elemental zinc )  220 mg Oral Daily    Dialysis Orders: MWF- [facility] 4hrs, BFR 350, DFR Auto 1.5,  EDW 67.1kg, 3K/ 2.5Ca Heparin  2500 unit bolus with hD Mircera 225 mcg q2wks - last 05/15/24 Calcitriol  0.25mcg PO qHD - last dose 05/27/24   Assessment/Plan: R BKA wound dehiscence: repaired with staples and dressing in the ED 9/5. Wound care following Sacral decubitus ulcer: S/p wound debridement by  Surgery PA 9/5. Surgery recommended ID consult. CT ABD/pelvis showed osteo. Per Primary, pt was discharged on 8/5 on IV vancomycin  with HD for MRSA bacteremia coverage by ID. Plan  to continue vancomycin  0.75g TIW until 06/15/2024 for a total of an 8-week course. Also started pip/Tazo > ceftaz as of 9/8 based on bone culture + proteus - continue ceftaz 1g TIW through 10/16 per ID ESRD -  on HD MWF. Next HD 9/12 - here or outpt ok.  Hypertension/volume  - BP acceptable. Not overloaded on exam. EDW recently lowered in outpatient. Requiring minimal UF  Anemia of CKD - Hgb 8-9s, no IV iron  while getting IV abx for severe infection. ON ESA here - aranesp  200 given 9/7. Secondary Hyperparathyroidism -  Corr Ca is ok and phos on lower side. No binders for now. Nutrition - Renal diet with fluid restriction; RD on board   Dipso plan is SNF/LTACH on d/c.  Ok for d/c from nephrology perspective.   Manuelita Barters MD [facility] Pager [phone]

## 2024-06-04 NOTE — Progress Notes (Signed)
 Mobility Specialist Progress Note:    06/04/24 1428  Mobility  Activity Ambulated with assistance (In hallway)  Level of Assistance Standby assist, set-up cues, supervision of patient - no hands on  Assistive Device Wheelchair  Distance Ambulated (ft) 270 ft  RLE Weight Bearing Per Provider Order NWB  Activity Response Tolerated well  Mobility Referral Yes  Mobility visit 1 Mobility  Mobility Specialist Start Time (ACUTE ONLY) 1414  Mobility Specialist Stop Time (ACUTE ONLY) 1425  Mobility Specialist Time Calculation (min) (ACUTE ONLY) 11 min   Received pt in wheelchair and agreeable to mobility. No physical assistance required. Pt propelled self in hallway. Returned to room without fault. Pt left in wheelchair with personal belongings and call light within reach. All needs met.  Lavanda Pollack Mobility Specialist  Please contact via [employer] or  Rehab Office [phone]

## 2024-06-04 NOTE — Progress Notes (Signed)
 HD#6 SUBJECTIVE:  Patient Summary: [name] is a 68 y.o. 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   Overnight Events: No acute events overnight  Interim History: The patient was seen and examined at the bedside this morning. She was comfortable and sitting on the side of the bed. She does report continued pain in the sacral region. She is feeling better overall and looking forward to potentially transferring to LTAC.  OBJECTIVE:  Vital Signs: Vitals:   06/03/24 2108 06/04/24 0102 06/04/24 0447 06/04/24 0847  BP: 110/62 (!) 108/55 (!) 122/57 102/62  Pulse: 84 71 72 86  Resp: 20 16 18    Temp: 98 F (36.7 C) 98.3 F (36.8 C) 98.1 F (36.7 C) 97.8 F (36.6 C)  TempSrc: Oral Oral Oral Oral  SpO2: 100% 99% 100% 100%  Weight:      Height:       Supplemental O2: Room Air SpO2: 100 % O2 Flow Rate (L/min): 2 L/min  Filed Weights   05/30/24 0513 05/30/24 0805 05/30/24 1130  Weight: 71.4 kg 70.4 kg 70.6 kg     Intake/Output Summary (Last 24 hours) at 06/04/2024 0953 Last data filed at 06/04/2024 0443 Gross per 24 hour  Intake 856.38 ml  Output 500 ml  Net 356.38 ml    Net IO Since Admission: -1,911.04 mL [06/04/24 0953]  Physical Exam: Const: Awake, alert in NAD HENT: Normocephalic, atraumatic, mucus membranes moist Card: RRR, No MRG, No pitting edema on LE's bilaterally  Resp: LCTAB, no increased work of breathing Abd: Soft, NTND, bsx4, colostomy located in the left lower quadrant with no erythema or signs of surrounding infection. Extremities: Warm. Right BKA noted.  Incision site with minimal drainage. Dressing C/D/I   Patient Lines/Drains/Airways Status     Active Line/Drains/Airways     Name Placement date Placement time Site Days   Peripheral IV 05/29/24 20  G Right Antecubital 05/29/24  1032  Antecubital  1   Hemodialysis Catheter Left Internal jugular Double lumen Permanent (Tunneled) 04/23/24  1619  Internal jugular  37   Colostomy RUQ 11/02/23  1100  RUQ  210   Wound 04/20/24 1833 Surgical Closed Surgical Incision Knee Right 04/20/24  1833  Knee  40   Wound 05/29/24 2033 Pressure Injury Sacrum Medial Stage 4 - Full thickness tissue loss with exposed bone, tendon or muscle. 05/29/24  2033  Sacrum  1            Pertinent labs and imaging:      Latest Ref Rng & Units 06/04/2024    4:00 AM 06/03/2024    8:37 AM 06/02/2024    2:54 AM  CBC  WBC 4.0 - 10.5 K/uL 11.4  12.3  12.9   Hemoglobin 12.0 - 15.0 g/dL 8.3  8.8  8.9   Hematocrit 36.0 - 46.0 % 27.4  29.2  29.5   Platelets 150 - 400 K/uL 319  407  325        Latest Ref Rng & Units 06/03/2024    8:37 AM 06/02/2024    2:54 AM 06/01/2024    3:39 AM  CMP  Glucose 70 - 99 mg/dL 827  81  830   BUN 8 - 23 mg/dL 19  8  19    Creatinine 0.44 - 1.00 mg/dL  4.37  2.87  4.42   Sodium 135 - 145 mmol/L 136  134  137   Potassium 3.5 - 5.1 mmol/L 3.9  3.3  3.9   Chloride 98 - 111 mmol/L 100  95  99   CO2 22 - 32 mmol/L 24  28  26    Calcium  8.9 - 10.3 mg/dL 7.9  7.5  7.8     No results found.   ASSESSMENT/PLAN:  Assessment: Principal Problem:   Osteomyelitis of vertebra, sacral and sacrococcygeal region 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 No concerns for sepsis at this time -Albumin  1.5. RD following to ensure proper nutrition to optimize wound healing.   -Bone culture preliminarily positive for proteus mirabilis  -Blood cultures negative and finalized -Per ID, Ceftazidime  MWF with dialysis through 10/16 -Continue Vanc through 9/22 for MRSA bacteremia -Continue dilaudid  q6 PRN and scheduled tylenol . Can escalate if she continues to experience significant pain that is not controlled with current management.  -Continue wound care -q2 turns with air mattress for pressure offloading  #Status post right BKA with wound dehiscence -Staples and dressing placed in the ED. -Per Dr. Harden, no surgical intervention and to continue daily dressing changes with Vashe 4x4 gauze with ace wrap. He will continue to follow outpatient.  -The patient noted increased drainage yesterday. Per Dr. Harden, staple were removed yesterday to allow the wound to drain.  -Examined today without erythema, purulence, or tenderness to palpation. Continue daily wound care.   #Hypotension -Improving with added midodrine  -Continue Midodrine  daily if SBP <90 and pt symptomatic  #ESRD on HD -Nephrology following, appreciate their recommendations. -Continue MWF schedule  #Normocytic anemia #Iron  deficiency anemia #Anemia of chronic disease -Patient with chronic history of IDA likely secondary to multiple factors including iron  deficiency and chronic kidney disease. -Hgb stable -Continue EPO per nephrology's recommendations  #Right internal jugular DVT - Last hospitalization (July) the patient was found to have a right internal jugular DVT.  At that time her [facility] was removed and replaced. Continue Eliquis  5 mg twice daily  #History  of diverticulitis #Status post ex lap with colostomy formation -Pt underwent exploratory laparotomy in February this year with subsequent colostomy formation. She is following with surgery outpatient.  -Colostomy in place and secure without signs of surrounding infection -Colostomy output has increased over the last 2 days, pt denying abdominal pain, cramps, fever, or chills -Continue proper wound care  #Chronic A-fib -Continue Eliquis  5 mg twice daily  #Type 2 diabetes melitis -A1c 5.1 -Will hold off on insulin  coverage at this time and continue to monitor closely  Best Practice: Diet: Regular diet VTE: eliquis  Code: DNR/DNI  Disposition planning: Therapy Recs: Pending, DME: other pending DISPO: Anticipated discharge pending to LTAC pending clinical  improvement.  Signature:  Schuyler Novak, DO [facility]  9:53 AM, 06/04/2024  On Call pager [phone]

## 2024-06-04 NOTE — Plan of Care (Signed)

## 2024-06-04 NOTE — Progress Notes (Signed)
 Physical Therapy Treatment Patient Details Name: [name] MRN: [id number] DOB: [birth year] Today's Date: 06/04/2024   History of Present Illness 68 y.o. female presentsd to [facility] hospital on 05/29/2024 with R BKA wound dehiscence.PMHx: ESRD on HD, colostomy, T2DM, Afib, Rt ankle ORIF, CAD    PT Comments  Pt resting in bed on arrival, pleasant and eager for OOB mobility. Pt able to transfer to stand with light min A to with HHA per pt preference and stand pivot to personal wheelchair with CGA. Pt needing max A to manage footrest and for locking/unlocking brakes. Pt able to self propel for wheelchair for hallway distance with light hands on assist to manage in tight spaces, especially with turning. Reviewed seated pressure relief techniques as well as frequency throughout time seated up. Pt continues to benefit from skilled PT services to progress toward functional mobility goals.     If plan is discharge home, recommend the following: A little help with walking and/or transfers;A little help with bathing/dressing/bathroom;Assistance with cooking/housework;Assist for transportation;Help with stairs or ramp for entrance   Can travel by private vehicle        Equipment Recommendations  Hospital bed;Other (comment) (power wheelchair (rental until able to get prosthetic))    Recommendations for Other Services       Precautions / Restrictions Precautions Precautions: Fall Recall of Precautions/Restrictions: Intact Precaution/Restrictions Comments: R BKA Restrictions Weight Bearing Restrictions Per Provider Order: Yes RLE Weight Bearing Per Provider Order: Non weight bearing     Mobility  Bed Mobility Overal bed mobility: Needs Assistance             General bed mobility comments: pt seated up on EOB on arrival    Transfers Overall transfer level: Needs assistance Equipment used: None Transfers: Sit to/from Stand, Bed to chair/wheelchair/BSC Sit to Stand: Min assist Stand  pivot transfers: Contact guard assist         General transfer comment: light min A to boost to stand from EOB, CGA to pivot from EOB to L to wheelchair    Ambulation/Gait               General Gait Details: unable   Psychologist, counselling mobility: Yes Wheelchair propulsion: Both upper extremities Wheelchair parts: Needs assistance Distance: 200 Wheelchair Assistance Details (indicate cue type and reason): pt needing hands on assist to manage footrest and to lock/unlock brakes. pt able to self propel with BUE, light assist to manage in tight spaces with turning   Tilt Bed    Modified Rankin (Stroke Patients Only)       Balance Overall balance assessment: Needs assistance Sitting-balance support: No upper extremity supported, Feet supported Sitting balance-Leahy Scale: Good     Standing balance support: Bilateral upper extremity supported, Reliant on assistive device for balance Standing balance-Leahy Scale: Poor                              Communication Communication Communication: No apparent difficulties  Cognition Arousal: Alert Behavior During Therapy: WFL for tasks assessed/performed   PT - Cognitive impairments: No apparent impairments                         Following commands: Intact      Cueing Cueing Techniques: Verbal cues  Exercises      General Comments  Pertinent Vitals/Pain Pain Assessment Pain Assessment: Faces Faces Pain Scale: Hurts little more Pain Location: sacrum Pain Descriptors / Indicators: Sore Pain Intervention(s): Monitored during session, Limited activity within patient's tolerance, Repositioned    Home Living                          Prior Function            PT Goals (current goals can now be found in the care plan section) Acute Rehab PT Goals Patient Stated Goal: to be able to live downstairs and not have to  negotiate stairs until receiving her prosthetic PT Goal Formulation: With patient Time For Goal Achievement: 06/12/24 Progress towards PT goals: Progressing toward goals    Frequency    Min 2X/week      PT Plan      Co-evaluation              AM-PAC PT 6 Clicks Mobility   Outcome Measure  Help needed turning from your back to your side while in a flat bed without using bedrails?: A Little Help needed moving from lying on your back to sitting on the side of a flat bed without using bedrails?: A Little Help needed moving to and from a bed to a chair (including a wheelchair)?: A Little Help needed standing up from a chair using your arms (e.g., wheelchair or bedside chair)?: A Little Help needed to walk in hospital room?: Total Help needed climbing 3-5 steps with a railing? : Total 6 Click Score: 14    End of Session   Activity Tolerance: Patient tolerated treatment well Patient left: Other (comment);with call bell/phone within reach (up in wheelchair) Nurse Communication: Mobility status PT Visit Diagnosis: Other abnormalities of gait and mobility (R26.89);Muscle weakness (generalized) (M62.81)     Time: 8952-8892 PT Time Calculation (min) (ACUTE ONLY): 20 min  Charges:    $Therapeutic Activity: 8-22 mins PT General Charges $$ ACUTE PT VISIT: 1 Visit                     Amani Nodarse R. PTA Acute Rehabilitation Services Office: [phone]   Therisa CHRISTELLA Boor 06/04/2024, 11:14 AM

## 2024-06-05 LAB — CBC WITH DIFFERENTIAL/PLATELET
Basophils Absolute: 0.1 K/uL (ref 0.0–0.1)
Basophils Relative: 1 %
Eosinophils Absolute: 0.5 K/uL (ref 0.0–0.5)
Eosinophils Relative: 4 %
HCT: 32.5 % — ABNORMAL LOW (ref 36.0–46.0)
Hemoglobin: 9.6 g/dL — ABNORMAL LOW (ref 12.0–15.0)
Lymphocytes Relative: 10 %
Lymphs Abs: 1.2 K/uL (ref 0.7–4.0)
MCH: 25.8 pg — ABNORMAL LOW (ref 26.0–34.0)
MCHC: 29.5 g/dL — ABNORMAL LOW (ref 30.0–36.0)
MCV: 87.4 fL (ref 80.0–100.0)
Monocytes Absolute: 0.2 K/uL (ref 0.1–1.0)
Monocytes Relative: 2 %
Neutro Abs: 9.8 K/uL — ABNORMAL HIGH (ref 1.7–7.7)
Neutrophils Relative %: 83 %
Platelets: 420 K/uL — ABNORMAL HIGH (ref 150–400)
RBC: 3.72 MIL/uL — ABNORMAL LOW (ref 3.87–5.11)
RDW: 22.2 % — ABNORMAL HIGH (ref 11.5–15.5)
Schistocytes: NONE SEEN
WBC: 11.8 K/uL — ABNORMAL HIGH (ref 4.0–10.5)
nRBC: 0 % (ref 0.0–0.2)

## 2024-06-05 LAB — GLUCOSE, CAPILLARY: Glucose-Capillary: 125 mg/dL — ABNORMAL HIGH (ref 70–99)

## 2024-06-05 LAB — AEROBIC/ANAEROBIC CULTURE W GRAM STAIN (SURGICAL/DEEP WOUND)

## 2024-06-05 MED ORDER — HEPARIN SODIUM (PORCINE) 1000 UNIT/ML IJ SOLN
INTRAMUSCULAR | Status: AC
  Filled 2024-06-05: qty 4

## 2024-06-05 MED ORDER — HEPARIN SODIUM (PORCINE) 1000 UNIT/ML IJ SOLN
INTRAMUSCULAR | Status: AC
  Filled 2024-06-05: qty 3

## 2024-06-05 MED ORDER — CALCITRIOL 0.25 MCG PO CAPS
0.2500 ug | ORAL_CAPSULE | ORAL | Status: DC
  Administered 2024-06-05 – 2024-06-08 (×2): 0.25 ug via ORAL
  Filled 2024-06-05: qty 1

## 2024-06-05 MED ORDER — LINEZOLID 600 MG PO TABS
600.0000 mg | ORAL_TABLET | Freq: Two times a day (BID) | ORAL | Status: DC
Start: 2024-06-05 — End: 2024-06-09
  Administered 2024-06-05 – 2024-06-09 (×9): 600 mg via ORAL
  Filled 2024-06-05 (×9): qty 1

## 2024-06-05 MED ORDER — CALCITRIOL 0.25 MCG PO CAPS
ORAL_CAPSULE | ORAL | Status: AC
  Filled 2024-06-05: qty 1

## 2024-06-05 MED ORDER — HEPARIN SODIUM (PORCINE) 1000 UNIT/ML IJ SOLN
3800.0000 [IU] | Freq: Once | INTRAMUSCULAR | Status: AC
  Administered 2024-06-05: 3800 [IU]

## 2024-06-05 MED ORDER — HEPARIN SODIUM (PORCINE) 1000 UNIT/ML IJ SOLN
2500.0000 [IU] | Freq: Once | INTRAMUSCULAR | Status: AC
  Administered 2024-06-05: 2500 [IU]
  Filled 2024-06-05: qty 2.5

## 2024-06-05 NOTE — NC FL2 (Signed)
 [place]  MEDICAID FL2 LEVEL OF CARE FORM     IDENTIFICATION  Patient Name: [name] Birthdate: 05-Sep-1956 Sex: female Admission Date (Current Location): 05/29/2024  [facility] and [place] Number:  Producer, television/film/video and Address:  [facility] [place], 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) 05/30/2024   Hypoalbuminemia due to protein-calorie malnutrition (HCC) 05/30/2024   Osteomyelitis of vertebra, sacral and sacrococcygeal region (HCC) 05/29/2024   Primary hypercoagulable state (HCC) 05/29/2024   BKA stump complication (HCC) 05/29/2024   Normocytic anemia 05/29/2024   Does not feel safe at home 05/29/2024   Colostomy in place [facility]) 05/29/2024   Diabetes mellitus with kidney complication, with long-term current use of insulin  (HCC) 05/29/2024   Osteomyelitis (HCC) 04/20/2024   MRSA bacteremia 04/20/2024   Bacteremia 04/20/2024   Necrotizing fasciitis (HCC) 04/20/2024   ESRD (end stage renal disease) (HCC) 04/20/2024   Irritant contact dermatitis associated with fecal stoma 12/27/2023   Colostomy complication, unspecified (HCC) 12/27/2023   Diverticulitis of large intestine with complication 10/27/2023   Foot callus 02/27/2021   Counseled about COVID-19 virus infection 02/24/2020   Aortic heart murmur 05/15/2019   Thyroid  enlargement 07/19/2014   Vitamin D  deficiency 09/03/2013    Hyperlipidemia 04/24/2013   Diabetic retinopathy (HCC) 01/23/2013   Chronic atrial fibrillation (HCC) 01/15/2013   Aortic atherosclerosis (HCC) 01/11/2013   H/O non-ST elevation myocardial infarction (NSTEMI) 01/09/2013   History of stroke 01/08/2013   Diabetic neuropathy 01/08/2013   Hypertension    Diabetes mellitus     Orientation RESPIRATION BLADDER Height & Weight     Self, Time, Situation, Place  O2 (room air 2L/min) Continent Weight:  (unable to get weight due to bed type) Height:  5' 7 (170.2 cm)  BEHAVIORAL SYMPTOMS/MOOD NEUROLOGICAL BOWEL NUTRITION STATUS      Colostomy Diet  AMBULATORY STATUS COMMUNICATION OF NEEDS Skin   Supervision Verbally PU Stage and Appropriate Care (pressure injury medial stage 4,)                       Personal Care Assistance Level of Assistance  Bathing, Dressing, Feeding Bathing Assistance: Limited assistance Feeding assistance: Limited assistance Dressing Assistance: Limited assistance     Functional Limitations Info  Sight, Hearing, Speech Sight Info: Impaired Hearing Info: Adequate Speech Info: Adequate    SPECIAL CARE FACTORS FREQUENCY  PT (By licensed PT), OT (By licensed OT)     PT Frequency: 5x/week OT Frequency: 5x/week            Contractures Contractures Info: Not present    Additional Factors Info  Code Status, Allergies Code Status Info: DNR-Limited Allergies Info: Nsaids, Lisinopril, Peanut containing drug products           Current Medications (06/05/2024):  This is the current hospital active medication list Current Facility-Administered Medications  Medication Dose Route Frequency Provider Last Rate Last Admin   acetaminophen  (  TYLENOL ) tablet 1,000 mg  1,000 mg Oral QID Myrna, Olivia, DO   1,000 mg at 06/05/24 9081   apixaban  (ELIQUIS ) tablet 5 mg  5 mg Oral BID [name], MD   5 mg at 06/05/24 9081   ascorbic acid  (VITAMIN C ) tablet 500 mg  500 mg Oral BID Trudy Mliss Dragon, MD   500  mg at 06/05/24 9081   brimonidine  (ALPHAGAN ) 0.2 % ophthalmic solution 1 drop  1 drop Both Eyes BID Smucker, Melvenia, MD   1 drop at 06/05/24 1103   calcitRIOL  (ROCALTROL ) capsule 0.25 mcg  0.25 mcg Oral Q M,W,F-HD Norine Manuelita LABOR, MD       cefTAZidime  (FORTAZ ) 1 g in sodium chloride  0.9 % 100 mL IVPB  1 g Intravenous Q M,W,F-HD Bevely Damien RAMAN, RPH 200 mL/hr at 06/03/24 1450 1 g at 06/03/24 1450   Chlorhexidine  Gluconate Cloth 2 % PADS 6 each  6 each Topical Q0600 Lenon Charmaine BRAVO, NP   6 each at 06/05/24 9383   collagenase  (SANTYL ) ointment   Topical BID Trudy Mliss Dragon, MD   Given by Other at 06/05/24 1257   Darbepoetin Alfa  (ARANESP ) injection 200 mcg  200 mcg Subcutaneous Q Sun-1800 Lenon Charmaine E, NP   200 mcg at 05/31/24 1734   feeding supplement (ENSURE PLUS HIGH PROTEIN) liquid 237 mL  237 mL Oral BID BM Eben Reyes BROCKS, MD   237 mL at 06/02/24 1410   fiber supplement (BANATROL TF) liquid 60 mL  60 mL Oral BID Eben Reyes BROCKS, MD   60 mL at 06/05/24 0919   heparin  sodium (porcine) injection 2,500 Units  2,500 Units Intracatheter Once Norine Manuelita LABOR, MD       HYDROmorphone  (DILAUDID ) tablet 1 mg  1 mg Oral Q6H PRN King, Olivia, DO   1 mg at 06/05/24 0917   latanoprost  (XALATAN ) 0.005 % ophthalmic solution 1 drop  1 drop Both Eyes QHS Smucker, Nathanael, MD   1 drop at 06/04/24 2102   linezolid  (ZYVOX ) tablet 600 mg  600 mg Oral Q12H King, Olivia, DO   600 mg at 06/05/24 1103   melatonin tablet 10 mg  10 mg Oral QHS PRN Paterson, Robert C, MD   10 mg at 06/04/24 2104   metoprolol  tartrate (LOPRESSOR ) tablet 25 mg  25 mg Oral BID King, Olivia, DO   25 mg at 06/05/24 9080   midodrine  (PROAMATINE ) tablet 2.5 mg  2.5 mg Oral Daily PRN King, Olivia, DO   2.5 mg at 06/02/24 1408   multivitamin (RENA-VIT) tablet 1 tablet  1 tablet Oral QHS Trudy Mliss Dragon, MD   1 tablet at 06/04/24 2104   nutrition supplement (JUVEN) (JUVEN) powder packet 1 packet  1 packet Oral  BID BM Eben Reyes BROCKS, MD   1 packet at 06/05/24 0919   pneumococcal 20-valent conjugate vaccine (PREVNAR 20) injection 0.5 mL  0.5 mL Intramuscular Prior to discharge Eben Reyes BROCKS, MD       zinc  sulfate (50mg  elemental zinc ) capsule 220 mg  220 mg Oral Daily Trudy Mliss Dragon, MD   220 mg at 06/05/24 9081     Discharge Medications: Please see discharge summary for a list of discharge medications.  Relevant Imaging Results:  Relevant Lab Results:   Additional Information SS# [id number]  Jeoffrey LITTIE Moose, [place]

## 2024-06-05 NOTE — Progress Notes (Signed)
 Occupational Therapy Treatment Patient Details Name: [name] MRN: [id number] DOB: [birth year] Today's Date: 06/05/2024   History of present illness 68 y.o. female presentsd to [facility] hospital on 05/29/2024 with R BKA wound dehiscence.PMHx: ESRD on HD, colostomy, T2DM, Afib, Rt ankle ORIF, CAD   OT comments  Pt. Seen for skilled OT treatment session.  Pt. Able to complete squat pivot transfers to/from bsc with CGA and support stabilizing 3n1 during pivots.  Pt. Able to state understanding and implementation of pressure relief strategies reviewed from previous session with PT yesterday.  Cont. With acute OT POC and current d/c recommendations.        If plan is discharge home, recommend the following:  A little help with walking and/or transfers;A little help with bathing/dressing/bathroom;Assistance with cooking/housework;Assist for transportation;Help with stairs or ramp for entrance   Equipment Recommendations  Hospital bed    Recommendations for Other Services      Precautions / Restrictions Precautions Precautions: Fall Recall of Precautions/Restrictions: Intact Precaution/Restrictions Comments: R BKA Restrictions RLE Weight Bearing Per Provider Order: Non weight bearing       Mobility Bed Mobility               General bed mobility comments: pt seated up on EOB on arrival    Transfers Overall transfer level: Needs assistance Equipment used: None Transfers: Sit to/from Stand, Bed to chair/wheelchair/BSC Sit to Stand: Min assist   Squat pivot transfers: Contact guard assist       General transfer comment: light min A to boost to stand from EOB, CGA to pivot from EOB to L to bsc then R from 3n1 back to seated eob     Balance                                           ADL either performed or assessed with clinical judgement   ADL Overall ADL's : Needs assistance/impaired                         Toilet Transfer: Contact guard  assist;Squat-pivot;BSC/3in1   Toileting- Clothing Manipulation and Hygiene: Contact guard assist;Sitting/lateral lean              Extremity/Trunk Sales promotion account executive Communication: No apparent difficulties   Cognition Arousal: Alert Behavior During Therapy: WFL for tasks assessed/performed Cognition: No apparent impairments                                        Cueing   Cueing Techniques: Verbal cues  Exercises      Shoulder Instructions       General Comments      Pertinent Vitals/ Pain       Pain Assessment Pain Assessment: Faces Faces Pain Scale: Hurts a little bit Pain Location: sacrum, and also reports feeling nauseous Pain Descriptors / Indicators: Sore Pain Intervention(s): Limited activity within patient's tolerance, Repositioned  Home Living  Prior Functioning/Environment              Frequency  Min 2X/week        Progress Toward Goals  OT Goals(current goals can now be found in the care plan section)  Progress towards OT goals: Progressing toward goals     Plan      Co-evaluation                 AM-PAC OT 6 Clicks Daily Activity     Outcome Measure   Help from another person eating meals?: None Help from another person taking care of personal grooming?: A Little Help from another person toileting, which includes using toliet, bedpan, or urinal?: A Little Help from another person bathing (including washing, rinsing, drying)?: A Little Help from another person to put on and taking off regular upper body clothing?: A Little Help from another person to put on and taking off regular lower body clothing?: A Little 6 Click Score: 19    End of Session    OT Visit Diagnosis: Unsteadiness on feet (R26.81);Other abnormalities of gait and mobility (R26.89);Muscle weakness  (generalized) (M62.81)   Activity Tolerance Patient tolerated treatment well   Patient Left in bed;Other (comment) (seated eob)   Nurse Communication Other (comment) (rn states ok to work with pt., alerted rn pt c/o nausea)        Time: 8852-8843 OT Time Calculation (min): 9 min  Charges: OT General Charges $OT Visit: 1 Visit OT Treatments $Self Care/Home Management : 8-22 mins  [name], COTA/L Acute Rehabilitation [phone]   [name] Nest [name]-COTA/L  06/05/2024, 12:07 PM

## 2024-06-05 NOTE — Plan of Care (Signed)
  Problem: Clinical Measurements: Goal: Will remain free from infection Outcome: Progressing   Problem: Clinical Measurements: Goal: Respiratory complications will improve Outcome: Progressing   Problem: Nutrition: Goal: Adequate nutrition will be maintained Outcome: Progressing   Problem: Coping: Goal: Level of anxiety will decrease Outcome: Progressing   Problem: Elimination: Goal: Will not experience complications related to bowel motility Outcome: Progressing   Problem: Pain Managment: Goal: General experience of comfort will improve and/or be controlled Outcome: Progressing   Problem: Safety: Goal: Ability to remain free from injury will improve Outcome: Progressing

## 2024-06-05 NOTE — Progress Notes (Signed)
 [facility] Progress Note   Subjective:   Seen and examined patient in room.  No new issues.  Awaiting SNF or LTACH placement.  PO intake remain on more limited side.   Objective Vitals:   06/04/24 2000 06/04/24 2106 06/05/24 0451 06/05/24 0731  BP: 108/60 108/60 (!) 120/56 135/63  Pulse: 79 79 73 78  Resp:   19 16  Temp:   98.3 F (36.8 C) 98.2 F (36.8 C)  TempSrc:   Oral Oral  SpO2:   100% 100%  Weight:      Height:       Physical Exam General: Awake, alert, on RA, NAD Heart: S1 and S2; No murmurs, gallops, or rubs Lungs: Clear throughout Extremities: No edema LLE; R BKA Dialysis Access: R [facility] c/d/i  [facility] Weights   05/30/24 0513 05/30/24 0805 05/30/24 1130  Weight: 71.4 kg 70.4 kg 70.6 kg    Intake/Output Summary (Last 24 hours) at 06/05/2024 0756 Last data filed at 06/05/2024 0600 Gross per 24 hour  Intake 120 ml  Output 300 ml  Net -180 ml     Additional Objective Labs: Basic Metabolic Panel: Recent Labs  Lab 06/01/24 0339 06/02/24 0254 06/03/24 0837  NA 137 134* 136  K 3.9 3.3* 3.9  CL 99 95* 100  CO2 26 28 24   GLUCOSE 169* 81 172*  BUN 19 8 19   CREATININE 4.42* 2.87* 4.37*  CALCIUM  7.8* 7.5* 7.9*  PHOS 3.9 2.1* 1.9*   Liver Function Tests: Recent Labs  Lab 06/01/24 0339 06/02/24 0254 06/03/24 0837  ALBUMIN  <1.5* 1.5* 1.8*   No results for input(s): LIPASE, AMYLASE in the last 168 hours. CBC: Recent Labs  Lab 05/29/24 1008 05/29/24 1907 06/01/24 0339 06/02/24 0254 06/03/24 0837 06/04/24 0400  WBC 12.4* 10.7* 10.0 12.9* 12.3* 11.4*  NEUTROABS 9.1* 8.7* 7.2  --   --   --   HGB 8.5* 8.7* 9.0* 8.9* 8.8* 8.3*  HCT 29.8* 29.8* 29.7* 29.5* 29.2* 27.4*  MCV 87.1 86.9 85.3 84.0 84.9 84.6  PLT 293 287 341 325 407* 319   Blood Culture    Component Value Date/Time   SDES BONE 05/29/2024 1904   SPECREQUEST NONE 05/29/2024 1904   CULT  05/29/2024 1904    FEW PROTEUS MIRABILIS FEW ENTEROCOCCUS  FAECIUM SUSCEPTIBILITIES TO FOLLOW Performed at [facility], [address], [place], [place] [address]    REPTSTATUS PENDING 05/29/2024 1904    Cardiac Enzymes: No results for input(s): CKTOTAL, CKMB, CKMBINDEX, TROPONINI in the last 168 hours. CBG: Recent Labs  Lab 06/03/24 1653 06/03/24 2103 06/04/24 0855 06/04/24 1702 06/04/24 2100  GLUCAP 165* 158* 162* 165* 149*   Iron  Studies:  No results for input(s): IRON , TIBC, TRANSFERRIN, FERRITIN in the last 72 hours.  Lab Results  Component Value Date   INR 1.1 04/19/2024   INR >10.0 (HH) 10/28/2023   INR 1.32 02/13/2013   Studies/Results: No results found.   Medications:  cefTAZidime  (FORTAZ )  IV 1 g (06/03/24 1450)   vancomycin  750 mg (06/03/24 1319)    acetaminophen   1,000 mg Oral QID   apixaban   5 mg Oral BID   ascorbic acid   500 mg Oral BID   brimonidine   1 drop Both Eyes BID   Chlorhexidine  Gluconate Cloth  6 each Topical Q0600   collagenase    Topical BID   darbepoetin (ARANESP ) injection - DIALYSIS  200 mcg Subcutaneous Q Sun-1800   feeding supplement  237 mL Oral BID BM  fiber supplement (BANATROL TF)  60 mL Oral BID   latanoprost   1 drop Both Eyes QHS   metoprolol  tartrate  25 mg Oral BID   multivitamin  1 tablet Oral QHS   nutrition supplement (JUVEN)  1 packet Oral BID BM   zinc  sulfate (50mg  elemental zinc )  220 mg Oral Daily    Dialysis Orders: MWF- [facility] 4hrs, BFR 350, DFR Auto 1.5,  EDW 67.1kg, 3K/ 2.5Ca Heparin  2500 unit bolus with hD Mircera 225 mcg q2wks - last 05/15/24 Calcitriol  0.25mcg PO qHD - last dose 05/27/24   Assessment/Plan: R BKA wound dehiscence: repaired with staples and dressing in the ED 9/5. Wound care following Sacral decubitus ulcer: S/p wound debridement by Surgery PA 9/5. Surgery recommended ID consult. CT ABD/pelvis showed osteo. Per Primary, pt was discharged on 8/5 on IV vancomycin  with HD for MRSA bacteremia coverage by  ID. Plan  to continue vancomycin  0.75g TIW until 06/15/2024 for a total of an 8-week course. Also started pip/Tazo > ceftaz as of 9/8 based on bone culture + proteus - continue ceftaz 1g TIW through 10/16 per ID ESRD -  on HD MWF. Next HD 9/12  Hypertension/volume  - BP acceptable. Not overloaded on exam. EDW recently lowered in outpatient. Requiring minimal UF and is on midodrine  Anemia of CKD - Hgb 8-9s, no IV iron  while getting IV abx for severe infection. ON ESA here - aranesp  200 given 9/7. Secondary Hyperparathyroidism -  Corr Ca is ok and phos on lower side. No binders for now. On calcitriol .  Nutrition - Renal diet with fluid restriction; RD on board   Dipso plan is SNF/LTACH on d/c.  Ok for d/c from nephrology perspective.   Manuelita Barters MD [facility] Pager [phone]

## 2024-06-05 NOTE — Progress Notes (Signed)
 Physical Therapy Wound Treatment Patient Details  Name: [name] MRN: [id number] Date of Birth: [birth year]  Today's Date: 06/05/2024 Time: 9085-9041 Time Calculation (min): 44 min  Subjective  Subjective Assessment Patient and Family Stated Goals: healed up Date of Onset:  (last admission) Prior Treatments: last and this present admission  Pain Score:  4/10  Wound Assessment  Wound 05/29/24 2033 Pressure Injury Sacrum Medial Stage 4 - Full thickness tissue loss with exposed bone, tendon or muscle. (Active)  Wound Image   05/29/24 2033  Site / Wound Assessment Yellow;Red;Granulation tissue 06/05/24 1400  Peri-wound Assessment Erythema (blanchable) 06/05/24 1400  Wound Length (cm) 11 cm 06/05/24 1400  Wound Width (cm) 9.7 cm 06/05/24 1400  Wound Surface Area (cm^2) 83.8 cm^2 06/05/24 1400  Wound Depth (cm) 2.4 cm 06/05/24 1400  Wound Volume (cm^3) 134.083 cm^3 06/05/24 1400  Drainage Description Serous 06/05/24 1400  Drainage Amount Moderate 06/05/24 1400  Treatment Cleansed;Debridement (Selective);Irrigation;Other (Comment) 06/05/24 1400  Dressing Type Foam - Lift dressing to assess site every shift;Gauze (Comment);Santyl ;Other (Comment);Barrier Film (skin prep) 06/05/24 1400  Dressing Changed Changed 06/05/24 0600  Dressing Status New drainage;Intact 06/05/24 1400  State of Healing Early/partial granulation 06/05/24 1400  % Wound base Red or Granulating 35% 06/05/24 1400  % Wound base Yellow/Fibrinous Exudate 60% 06/05/24 1400  % Wound base Black/Eschar 0% 05/30/24 1414  % Wound base Other/Granulation Tissue (Comment) 5% 06/05/24 1400  Tunneling (cm) 3 cm at 8 o'clock 06/05/24 1400  Undermining (cm) circumfrential undermining 12 o'clock 1.7 cm, 2:00 o'clock 1.8 cm, 9 o'clock 2.1, 10 o'clock 3.1 06/05/24 1400  Margins Epibole (rolled edges) 06/05/24 1400   Selective Debridement (non-excisional) Selective Debridement (non-excisional) - Location: sacral Selective  Debridement (non-excisional) - Tools Used: Forceps, Scalpel, Scissors Selective Debridement (non-excisional) - Tissue Removed: yellow eschar/slough some of which is friable    Wound Assessment and Plan  Wound Therapy - Assess/Plan/Recommendations Wound Therapy - Clinical Statement: Pt premedicated prior to treatment, with good tolerance. Wound bed with increased, red beefy granulation tissue, small pockets of friable, yellow slough removed today. PT will follow back for hydrotherapy next week unless WOC RN determines wound is ready for wound vac placement prior to 9/16. Wound Therapy - Functional Problem List: new BKA, presently participateing with acute OT/PT.  Slow and effortful mobility, CGA and rail to get to EOB. Factors Delaying/Impairing Wound Healing: Altered sensation, Diabetes Mellitus Hydrotherapy Plan: Debridement, Dressing change, Patient/family education Wound Therapy - Frequency: 2X / week Wound Therapy - Current Recommendations: PT Wound Therapy - Follow Up Recommendations: dressing changes by RN  Wound Therapy Goals- Improve the function of patient's integumentary system by progressing the wound(s) through the phases of wound healing (inflammation - proliferation - remodeling) by: Wound Therapy Goals - Improve the function of patient's integumentary system by progressing the wound(s) through the phases of wound healing by: Decrease Necrotic Tissue to: 20 Decrease Necrotic Tissue - Progress: Progressing toward goal Increase Granulation Tissue to: 80 Increase Granulation Tissue - Progress: Progressing toward goal Time For Goal Achievement: 7 days Wound Therapy - Potential for Goals: Good  Goals will be updated until maximal potential achieved or discharge criteria met.  Discharge criteria: when goals achieved, discharge from hospital, MD decision/surgical intervention, no progress towards goals, refusal/missing three consecutive treatments without notification or medical  reason.  GP     Charges PT Wound Care Charges $Wound Debridement up to 20 cm: < or equal to 20 cm $ Wound Debridement each add'l 20 sqcm: 2 $PT  Hydrotherapy Visit: 1 Visit     Jamarco Zaldivar B. King Lapidus PT, DPT Acute Rehabilitation Services Please use secure chat or  Call Office [phone]   [name] [facility] 06/05/2024, 2:28 PM

## 2024-06-05 NOTE — TOC Initial Note (Signed)
 Transition of Care [facility]) - Initial/Assessment Note    Patient Details  Name: [name] MRN: [id number] Date of Birth: 1956/04/25  [facility] of [facility]) CM/SW Contact:    [name] [name] Phone Number: 06/05/2024, 2:07 PM  Clinical Narrative:                 Pt admitted from home due to wound dehiscence. CSW completed SNF workup with pt, pt agreeable to discharge plan. CSW completed Fl2 and sent out SNF referrals. CSW will follow up to provide bed offers.   Expected Discharge Plan: Skilled Nursing Facility Barriers to Discharge: Continued Medical Work up, English as a second language teacher, SNF Pending bed offer   Patient Goals and CMS Choice Patient states their goals for this hospitalization and ongoing recovery are:: SNF          Expected Discharge Plan and Services       Living arrangements for the past 2 months: Single Family Home                                      Prior Living Arrangements/Services Living arrangements for the past 2 months: Single Family Home Lives with:: Self Patient language and need for interpreter reviewed:: Yes Do you feel safe going back to the place where you live?: Yes      Need for Family Participation in Patient Care: Yes (Comment) Care giver support system in place?: Yes (comment)   Criminal Activity/Legal Involvement Pertinent to Current Situation/Hospitalization: No - Comment as needed  Activities of Daily Living   ADL Screening (condition at time of admission) Independently performs ADLs?: No Does the patient have a NEW difficulty with bathing/dressing/toileting/self-feeding that is expected to last >3 days?: No Does the patient have a NEW difficulty with getting in/out of bed, walking, or climbing stairs that is expected to last >3 days?: Yes (Initiates electronic notice to provider for possible PT consult) Does the patient have a NEW difficulty with communication that is expected to last >3 days?: No Is the patient  deaf or have difficulty hearing?: No Does the patient have difficulty seeing, even when wearing glasses/contacts?: No Does the patient have difficulty concentrating, remembering, or making decisions?: No  Permission Sought/Granted Permission sought to share information with : Facility Medical sales representative, Family Supports    Share Information with NAME: [name]  Permission granted to share info w AGENCY: SNFs  Permission granted to share info w Relationship: Son  Permission granted to share info w Contact Information: [phone]  Emotional Assessment   Attitude/Demeanor/Rapport: Engaged Affect (typically observed): Accepting Orientation: : Oriented to Self, Oriented to Place, Oriented to  Time, Oriented to Situation Alcohol  / Substance Use: Not Applicable Psych Involvement: No (comment)  Admission diagnosis:  ESRD (end stage renal disease) (HCC) [N18.6] Wound dehiscence [T81.30XA] Sacral decubitus ulcer, stage IV (HCC) [L89.154] Patient Active Problem List   Diagnosis Date Noted   Wound dehiscence 06/04/2024   Severe protein-calorie malnutrition (HCC) 06/03/2024   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) 05/30/2024   Hypoalbuminemia due to protein-calorie malnutrition (HCC) 05/30/2024   Osteomyelitis of vertebra, sacral and sacrococcygeal region (HCC) 05/29/2024   Primary hypercoagulable state (HCC) 05/29/2024   BKA stump complication (HCC) 05/29/2024   Normocytic anemia 05/29/2024   Does not feel safe at home 05/29/2024   Colostomy in place [facility]) 05/29/2024   Diabetes mellitus with kidney complication, with long-term current use of  insulin  (HCC) 05/29/2024   Osteomyelitis (HCC) 04/20/2024   MRSA bacteremia 04/20/2024   Bacteremia 04/20/2024   Necrotizing fasciitis (HCC) 04/20/2024   ESRD (end stage renal disease) (HCC) 04/20/2024   Irritant contact dermatitis associated with fecal stoma 12/27/2023   Colostomy complication, unspecified (HCC) 12/27/2023   Diverticulitis  of large intestine with complication 10/27/2023   Foot callus 02/27/2021   Counseled about COVID-19 virus infection 02/24/2020   Aortic heart murmur 05/15/2019   Thyroid  enlargement 07/19/2014   Vitamin D  deficiency 09/03/2013   Hyperlipidemia 04/24/2013   Diabetic retinopathy (HCC) 01/23/2013   Chronic atrial fibrillation (HCC) 01/15/2013   Aortic atherosclerosis (HCC) 01/11/2013   H/O non-ST elevation myocardial infarction (NSTEMI) 01/09/2013   History of stroke 01/08/2013   Diabetic neuropathy 01/08/2013   Hypertension    Diabetes mellitus    PCP:  Campbell Reynolds, NP Pharmacy:   [facility] - [place], [place] - [address] [place] [address] Phone: [phone] Fax: [phone]  [id number] - [address] [place]), [place] - [address] [place] ([place] [address] Phone: [phone] Fax: [phone]  [id number] - [place], [place] - [address] [place] [address] Phone: [phone] Fax: [phone]     Social Drivers of Health (SDOH) Social History: SDOH Screenings   Food Insecurity: No Food Insecurity (05/29/2024)  Housing: Low Risk  (05/29/2024)  Transportation Needs: No Transportation Needs (05/29/2024)  Utilities: Not At Risk (05/29/2024)  Depression (PHQ2-9): Low Risk  (02/28/2021)  Financial Resource Strain: Low Risk  (08/25/2021)   Received from [facility]  Physical Activity: Insufficiently Active (06/20/2021)   Received from [facility]  Social Connections: Moderately Integrated (05/29/2024)  Recent Concern: Social Connections - Socially Isolated (04/19/2024)  Stress: No Stress Concern Present (08/25/2021)   Received from [facility]  Tobacco Use: Low Risk  (05/29/2024)   SDOH Interventions:     Readmission Risk Interventions     No data to display

## 2024-06-05 NOTE — Progress Notes (Signed)
 HD#7 SUBJECTIVE:  Patient Summary: [name] is a 68 y.o. 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   Overnight Events: No acute events overnight  Interim History: The patient was seen and examined at the bedside this morning. She was feeling well overall, however still having pain in the sacral region. All questions and concerns were addressed at this time.   OBJECTIVE:  Vital Signs: Vitals:   06/04/24 2000 06/04/24 2106 06/05/24 0451 06/05/24 0731  BP: 108/60 108/60 (!) 120/56 135/63  Pulse: 79 79 73 78  Resp:   19 16  Temp:   98.3 F (36.8 C) 98.2 F (36.8 C)  TempSrc:   Oral Oral  SpO2:   100% 100%  Weight:      Height:       Supplemental O2: Room Air SpO2: 100 % O2 Flow Rate (L/min): 2 L/min  Filed Weights   05/30/24 0513 05/30/24 0805 05/30/24 1130  Weight: 71.4 kg 70.4 kg 70.6 kg     Intake/Output Summary (Last 24 hours) at 06/05/2024 0949 Last data filed at 06/05/2024 0600 Gross per 24 hour  Intake 120 ml  Output 300 ml  Net -180 ml    Net IO Since Admission: -2,091.04 mL [06/05/24 0949]  Physical Exam: Const: Awake, alert in NAD HENT: Normocephalic, atraumatic, mucus membranes moist Card: RRR, No MRG, No pitting edema on LE's bilaterally  Resp: LCTAB, no increased work of breathing Abd: Soft, NTND  Extremities: Warm. Right BKA noted.  Incision site with minimal drainage. Dressing C/D/I. Non tender to palpation   Patient Lines/Drains/Airways Status     Active Line/Drains/Airways     Name Placement date Placement time Site Days   Peripheral IV 05/29/24 20 G Right Antecubital 05/29/24  1032  Antecubital  1   Hemodialysis Catheter Left Internal jugular Double lumen Permanent (Tunneled) 04/23/24  1619  Internal jugular  37   Colostomy RUQ 11/02/23   1100  RUQ  210   Wound 04/20/24 1833 Surgical Closed Surgical Incision Knee Right 04/20/24  1833  Knee  40   Wound 05/29/24 2033 Pressure Injury Sacrum Medial Stage 4 - Full thickness tissue loss with exposed bone, tendon or muscle. 05/29/24  2033  Sacrum  1            Pertinent labs and imaging:      Latest Ref Rng & Units 06/04/2024    4:00 AM 06/03/2024    8:37 AM 06/02/2024    2:54 AM  CBC  WBC 4.0 - 10.5 K/uL 11.4  12.3  12.9   Hemoglobin 12.0 - 15.0 g/dL 8.3  8.8  8.9   Hematocrit 36.0 - 46.0 % 27.4  29.2  29.5   Platelets 150 - 400 K/uL 319  407  325        Latest Ref Rng & Units 06/03/2024    8:37 AM 06/02/2024    2:54 AM 06/01/2024    3:39 AM  CMP  Glucose 70 - 99 mg/dL 827  81  830   BUN 8 - 23 mg/dL 19  8  19    Creatinine 0.44 - 1.00 mg/dL 5.62  7.12  5.57   Sodium 135 - 145 mmol/L 136  134  137   Potassium 3.5 - 5.1 mmol/L 3.9  3.3  3.9   Chloride 98 - 111 mmol/L 100  95  99   CO2 22 - 32 mmol/L 24  28  26    Calcium  8.9 - 10.3 mg/dL 7.9  7.5  7.8     No results found.   ASSESSMENT/PLAN:  Assessment: Principal Problem:   Osteomyelitis of vertebra, sacral and sacrococcygeal region [facility]) Active Problems:   Hypertension   Aortic atherosclerosis (HCC)   Chronic atrial fibrillation (HCC)   MRSA bacteremia   ESRD (end stage renal disease) (HCC)   Primary hypercoagulable state (HCC)   BKA stump complication (HCC)   Normocytic anemia   Does not feel safe at home   Colostomy in place [facility])   Diabetes mellitus with kidney complication, with long-term current use of insulin  (HCC)   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)   Hypoalbuminemia due to protein-calorie malnutrition (HCC)   Severe protein-calorie malnutrition (HCC)   Wound dehiscence   Plan: #Stage IV sacral decubitus ulcer #History of MRSA/strep B Bacteremia #Malnutrition -s/p bedside debridement on 9/5 -Albumin  1.5. RD following to ensure proper nutrition to optimize wound healing.   -Blood  cultures negative and finalized -Per ID, Ceftazidime  MWF with dialysis through 10/16 -Continue Vanc through 9/22 for MRSA bacteremia -Continue dilaudid  q6 PRN and scheduled tylenol .   -Continue wound care -q2 turns with air mattress for pressure offloading -Bone culture preliminarily positive for proteus mirabilis and enterococcus faecium. Will await susceptibilities and adjust abx as needed.   #Status post right BKA with wound dehiscence -Staples and dressing placed in the ED. -Per Dr. Harden, no surgical intervention and to continue daily dressing changes with Vashe 4x4 gauze with ace wrap. He will continue to follow outpatient.   #Hypotension -Improved with added midodrine  -Continue Midodrine  daily if SBP <90 and pt symptomatic  #ESRD on HD -Nephrology following, appreciate their recommendations. -Continue MWF schedule  #Normocytic anemia #Iron  deficiency anemia #Anemia of chronic disease -Patient with chronic history of IDA likely secondary to multiple factors including iron  deficiency and chronic kidney disease. -Hgb stable -Continue EPO per nephrology's recommendations  #Right internal jugular DVT - Last hospitalization (July) the patient was found to have a right internal jugular DVT.  At that time her [facility] was removed and replaced. -Continue Eliquis  5 mg twice daily  #History of diverticulitis #Status post ex lap with colostomy formation -Pt underwent exploratory laparotomy in February this year with subsequent colostomy formation. She is following with surgery outpatient.  -Colostomy in place and secure without signs of surrounding infection -Continue proper wound care  #Chronic A-fib -Continue Eliquis  5 mg twice daily  #Type 2 diabetes melitis -A1c 5.1 -Will hold off on insulin  coverage at this time and continue to monitor closely  Best Practice: Diet: Regular diet VTE: eliquis  Code: DNR/DNI  Disposition planning: Therapy Recs: Pending, DME: other  pending DISPO: Anticipated discharge pending to LTAC pending clinical improvement.  Signature:  Connie Novak, DO [name]  9:49 AM, 06/05/2024  On Call pager [phone]

## 2024-06-06 DIAGNOSIS — Z1621 Resistance to vancomycin: Secondary | ICD-10-CM

## 2024-06-06 DIAGNOSIS — B964 Proteus (mirabilis) (morganii) as the cause of diseases classified elsewhere: Secondary | ICD-10-CM

## 2024-06-06 DIAGNOSIS — B952 Enterococcus as the cause of diseases classified elsewhere: Secondary | ICD-10-CM

## 2024-06-06 LAB — RENAL FUNCTION PANEL
Albumin: 1.7 g/dL — ABNORMAL LOW (ref 3.5–5.0)
Anion gap: 13 (ref 5–15)
BUN: 20 mg/dL (ref 8–23)
CO2: 22 mmol/L (ref 22–32)
Calcium: 8 mg/dL — ABNORMAL LOW (ref 8.9–10.3)
Chloride: 99 mmol/L (ref 98–111)
Creatinine, Ser: 2.74 mg/dL — ABNORMAL HIGH (ref 0.44–1.00)
GFR, Estimated: 18 mL/min — ABNORMAL LOW (ref >60)
Glucose, Bld: 77 mg/dL (ref 70–99)
Phosphorus: 2.3 mg/dL — ABNORMAL LOW (ref 2.5–4.6)
Potassium: 3.8 mmol/L (ref 3.5–5.1)
Sodium: 134 mmol/L — ABNORMAL LOW (ref 135–145)

## 2024-06-06 LAB — BASIC METABOLIC PANEL WITH GFR
Anion gap: 11 (ref 5–15)
BUN: 20 mg/dL (ref 8–23)
CO2: 23 mmol/L (ref 22–32)
Calcium: 8 mg/dL — ABNORMAL LOW (ref 8.9–10.3)
Chloride: 98 mmol/L (ref 98–111)
Creatinine, Ser: 2.72 mg/dL — ABNORMAL HIGH (ref 0.44–1.00)
GFR, Estimated: 18 mL/min — ABNORMAL LOW (ref >60)
Glucose, Bld: 78 mg/dL (ref 70–99)
Potassium: 3.7 mmol/L (ref 3.5–5.1)
Sodium: 132 mmol/L — ABNORMAL LOW (ref 135–145)

## 2024-06-06 LAB — GLUCOSE, CAPILLARY: Glucose-Capillary: 130 mg/dL — ABNORMAL HIGH (ref 70–99)

## 2024-06-06 MED ORDER — BOOST / RESOURCE BREEZE PO LIQD CUSTOM
1.0000 | Freq: Three times a day (TID) | ORAL | Status: DC
  Administered 2024-06-06 – 2024-06-09 (×8): 1 via ORAL

## 2024-06-06 NOTE — Progress Notes (Signed)
 HD#8 SUBJECTIVE:  Patient Summary: [name] is a 68 y.o. 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   Overnight Events: No acute events overnight  Interim History: The patient was seen and examined at the bedside this morning.  She is feeling well however continues to note pain in her sacral region.  We did discuss the VRE found in her wound as well as addressing her antibiotics.  We did address her nutritional status that she has been declining and ensures.  All questions and concerns were addressed at this time.  OBJECTIVE:  Vital Signs: Vitals:   06/05/24 1735 06/05/24 1827 06/05/24 2025 06/06/24 0400  BP: 127/76 (!) 120/58 113/61 (!) (P) 110/53  Pulse: 81 81 82 (P) 81  Resp: 15 16 16  (P) 18  Temp:  98 F (36.7 C) 98 F (36.7 C) (P) 99.2 F (37.3 C)  TempSrc:    (P) Oral  SpO2: 99% 100% 100% (P) 100%  Weight:      Height:       Supplemental O2: Room Air SpO2: (P) 100 % O2 Flow Rate (L/min): 2 L/min  Filed Weights   06/05/24 1420  Weight: (S) 65.8 kg     Intake/Output Summary (Last 24 hours) at 06/06/2024 0804 Last data filed at 06/05/2024 1827 Gross per 24 hour  Intake --  Output 1000 ml  Net -1000 ml    Net IO Since Admission: -3,091.04 mL [06/06/24 0804]  Physical Exam: Const: Awake, alert in NAD HENT: Normocephalic, atraumatic, mucus membranes moist Card: RRR, No MRG, No pitting edema on LE's bilaterally  Resp: No increased work of breathing Abd: Soft, NTND  Extremities: Warm. Right BKA noted. Dressing C/D/I. Non tender to palpation   Patient Lines/Drains/Airways Status     Active Line/Drains/Airways     Name Placement date Placement time Site Days   Peripheral IV 05/29/24 20 G Right Antecubital 05/29/24  1032  Antecubital  1   Hemodialysis  Catheter Left Internal jugular Double lumen Permanent (Tunneled) 04/23/24  1619  Internal jugular  37   Colostomy RUQ 11/02/23  1100  RUQ  210   Wound 04/20/24 1833 Surgical Closed Surgical Incision Knee Right 04/20/24  1833  Knee  40   Wound 05/29/24 2033 Pressure Injury Sacrum Medial Stage 4 - Full thickness tissue loss with exposed bone, tendon or muscle. 05/29/24  2033  Sacrum  1            Pertinent labs and imaging:      Latest Ref Rng & Units 06/05/2024   12:59 PM 06/04/2024    4:00 AM 06/03/2024    8:37 AM  CBC  WBC 4.0 - 10.5 K/uL 11.8  11.4  12.3   Hemoglobin 12.0 - 15.0 g/dL 9.6  8.3  8.8   Hematocrit 36.0 - 46.0 % 32.5  27.4  29.2   Platelets 150 - 400 K/uL 420  319  407        Latest Ref Rng & Units 06/06/2024    2:56 AM 06/03/2024    8:37 AM 06/02/2024    2:54 AM  CMP  Glucose 70 - 99 mg/dL 70 - 99 mg/dL 77    78  827  81   BUN 8 - 23 mg/dL 8 - 23 mg/dL 20  20  19  8    Creatinine 0.44 - 1.00 mg/dL 9.55 - 8.99 mg/dL 7.25    7.27  5.62  7.12   Sodium 135 - 145 mmol/L 135 - 145 mmol/L 134    132  136  134   Potassium 3.5 - 5.1 mmol/L 3.5 - 5.1 mmol/L 3.8    3.7  3.9  3.3   Chloride 98 - 111 mmol/L 98 - 111 mmol/L 99    98  100  95   CO2 22 - 32 mmol/L 22 - 32 mmol/L 22    23  24  28    Calcium  8.9 - 10.3 mg/dL 8.9 - 89.6 mg/dL 8.0    8.0  7.9  7.5     No results found.   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 RD following to ensure proper nutrition to optimize wound healing.   -Blood cultures negative and finalized -Continue dilaudid  q6 PRN and scheduled tylenol .   -Continue wound care -q2 turns with air mattress for pressure offloading - Blood culture obtained from wound at time of debridement positive for Proteus mirabilis and Enterococcus faecium.E.faecium resistant to vancomycin  however it is sensitive to linezolid .  Antibiotics adjusted to linezolid  for the a 6-week duration.  Continue ceftazidime  for Proteus mirabilis coverage through 10/16 with dialysis.  -As patient is on a long duration of linezolid  she will need weekly CBCs to monitor for myelosuppression. -Contact precautions in place.  #Malnutrition -Patient's albumin  on admission was less than 1.5 and [facility] dietary was consulted.  However it is noted that patient has been refusing her high-protein ensures. -Discussed this with the patient today and she stated that they do not agree with her stomach and they make her quite queasy. -Discussed adjusted to a different nutritional supplement as nutrition is imperative to adequate wound healing.  The patient did verbalize understanding and stated that she would give it a shot. -Will adjust Ensure to boost supplementation and see if the patient is able to tolerate these.  #Status post right BKA with wound dehiscence -Staples and dressing placed in the ED. -Per Dr. Harden, no surgical intervention and to continue daily dressing changes with Vashe 4x4 gauze with ace wrap. He will continue to follow outpatient.   #Hypotension -Improved with added midodrine  -Continue Midodrine  daily if SBP <90 and pt symptomatic  #ESRD on HD -Nephrology following, appreciate  their recommendations. -Continue MWF schedule  #Normocytic anemia #Iron  deficiency anemia #Anemia of chronic disease -Patient with chronic history of IDA likely secondary to multiple factors including iron  deficiency and chronic kidney disease. -Hgb stable -Continue EPO per nephrology's recommendations  #Right internal jugular DVT - Last hospitalization (July) the patient was found to have a right internal jugular DVT.  At that time her [facility] was removed and replaced. -Continue Eliquis  5 mg twice daily  #History of diverticulitis #Status post ex lap with colostomy formation -Pt underwent exploratory laparotomy in February this year with subsequent colostomy formation. She is following with surgery outpatient.  -Colostomy in place and secure without signs of surrounding infection -Continue proper wound care  #Chronic A-fib -Continue Eliquis  5 mg twice daily  #Type 2 diabetes melitis -A1c 5.1 -Glucose stable with goal less than 180. -Will hold off on insulin  coverage at this time and continue to monitor closely  Best Practice: Diet: Regular diet VTE: eliquis  Code: DNR/DNI  Disposition planning: Therapy Recs: Pending, DME: other pending DISPO: Anticipated discharge pending to LTAC pending clinical improvement.  Signature:  Schuyler Novak, DO [facility]  8:04 AM, 06/06/2024  On Call pager [phone]

## 2024-06-06 NOTE — Plan of Care (Signed)

## 2024-06-06 NOTE — Progress Notes (Signed)
 Mobility Specialist Progress Note:    06/06/24 1403  Mobility  Activity Ambulated with assistance (In hallway)  Level of Assistance Contact guard assist, steadying assist  Assistive Device Wheelchair  Distance Ambulated (ft) 140 ft  RLE Weight Bearing Per Provider Order NWB  Activity Response Tolerated well  Mobility Referral Yes  Mobility visit 1 Mobility  Mobility Specialist Start Time (ACUTE ONLY) 1336  Mobility Specialist Stop Time (ACUTE ONLY) 1357  Mobility Specialist Time Calculation (min) (ACUTE ONLY) 21 min   Received pt EOB and agreeable to mobility. No physical assistance required. Pt propelled self in hallway. Returned to room without fault. Pt left EOB with personal belongings and call light within reach. All needs met.   Lavanda Pollack Mobility Specialist  Please contact via [employer] or  Rehab Office [phone]'

## 2024-06-06 NOTE — Progress Notes (Signed)
 [facility] Progress Note   Subjective:   Seen and examined patient in room.  No new issues.  Awaiting SNF or LTACH placement.  UF with HD yesterday 1L, tolerated fine, asymptomatic.  Eating well now.   Objective Vitals:   06/05/24 1735 06/05/24 1827 06/05/24 2025 06/06/24 0400  BP: 127/76 (!) 120/58 113/61 (!) (P) 110/53  Pulse: 81 81 82 (P) 81  Resp: 15 16 16  (P) 18  Temp:  98 F (36.7 C) 98 F (36.7 C) (P) 99.2 F (37.3 C)  TempSrc:    (P) Oral  SpO2: 99% 100% 100% (P) 100%  Weight:      Height:       Physical Exam General: Awake, alert, on RA, NAD Heart: S1 and S2; No murmurs, gallops, or rubs Lungs: Clear throughout Extremities: No edema LLE; R BKA Dialysis Access: R [facility] c/d/i  Filed Weights   06/05/24 1420  Weight: (S) 65.8 kg    Intake/Output Summary (Last 24 hours) at 06/06/2024 0813 Last data filed at 06/05/2024 1827 Gross per 24 hour  Intake --  Output 1000 ml  Net -1000 ml     Additional Objective Labs: Basic Metabolic Panel: Recent Labs  Lab 06/02/24 0254 06/03/24 0837 06/06/24 0256  NA 134* 136 134*  132*  K 3.3* 3.9 3.8  3.7  CL 95* 100 99  98  CO2 28 24 22  23   GLUCOSE 81 172* 77  78  BUN 8 19 20  20   CREATININE 2.87* 4.37* 2.74*  2.72*  CALCIUM  7.5* 7.9* 8.0*  8.0*  PHOS 2.1* 1.9* 2.3*   Liver Function Tests: Recent Labs  Lab 06/02/24 0254 06/03/24 0837 06/06/24 0256  ALBUMIN  1.5* 1.8* 1.7*   No results for input(s): LIPASE, AMYLASE in the last 168 hours. CBC: Recent Labs  Lab 06/01/24 0339 06/02/24 0254 06/03/24 0837 06/04/24 0400 06/05/24 1259  WBC 10.0 12.9* 12.3* 11.4* 11.8*  NEUTROABS 7.2  --   --   --  9.8*  HGB 9.0* 8.9* 8.8* 8.3* 9.6*  HCT 29.7* 29.5* 29.2* 27.4* 32.5*  MCV 85.3 84.0 84.9 84.6 87.4  PLT 341 325 407* 319 420*   Blood Culture    Component Value Date/Time   SDES BONE 05/29/2024 1904   SPECREQUEST NONE 05/29/2024 1904   CULT  05/29/2024 1904    FEW PROTEUS  MIRABILIS FEW ENTEROCOCCUS FAECIUM VANCOMYCIN  RESISTANT ENTEROCOCCUS ISOLATED NO ANAEROBES ISOLATED Performed at [facility], [address] [place], [place] [address]    REPTSTATUS 06/05/2024 FINAL 05/29/2024 1904    Cardiac Enzymes: No results for input(s): CKTOTAL, CKMB, CKMBINDEX, TROPONINI in the last 168 hours. CBG: Recent Labs  Lab 06/03/24 2103 06/04/24 0855 06/04/24 1702 06/04/24 2100 06/05/24 0829  GLUCAP 158* 162* 165* 149* 125*   Iron  Studies:  No results for input(s): IRON , TIBC, TRANSFERRIN, FERRITIN in the last 72 hours.  Lab Results  Component Value Date   INR 1.1 04/19/2024   INR >10.0 (HH) 10/28/2023   INR 1.32 02/13/2013   Studies/Results: No results found.   Medications:  cefTAZidime  (FORTAZ )  IV 1 g (06/05/24 1912)    acetaminophen   1,000 mg Oral QID   apixaban   5 mg Oral BID   ascorbic acid   500 mg Oral BID   brimonidine   1 drop Both Eyes BID   calcitRIOL   0.25 mcg Oral Q M,W,F-HD   Chlorhexidine  Gluconate Cloth  6 each Topical Q0600   collagenase    Topical BID  darbepoetin (ARANESP ) injection - DIALYSIS  200 mcg Subcutaneous Q Sun-1800   feeding supplement  1 Container Oral TID BM   feeding supplement  237 mL Oral BID BM   fiber supplement (BANATROL TF)  60 mL Oral BID   latanoprost   1 drop Both Eyes QHS   linezolid   600 mg Oral Q12H   metoprolol  tartrate  25 mg Oral BID   multivitamin  1 tablet Oral QHS   nutrition supplement (JUVEN)  1 packet Oral BID BM   zinc  sulfate (50mg  elemental zinc )  220 mg Oral Daily    Dialysis Orders: MWF- [facility] 4hrs, BFR 350, DFR Auto 1.5,  EDW 67.1kg, 3K/ 2.5Ca Heparin  2500 unit bolus with hD Mircera 225 mcg q2wks - last 05/15/24 Calcitriol  0.25mcg PO qHD - last dose 05/27/24   Assessment/Plan: R BKA wound dehiscence: repaired with staples and dressing in the ED 9/5. Wound care following Sacral decubitus ulcer: S/p wound debridement by Surgery PA 9/5.  Surgery recommended ID consult. CT ABD/pelvis showed osteo. Per Primary, pt was discharged on 8/5 on IV vancomycin  with HD for MRSA bacteremia coverage by ID. Plan  to continue vancomycin  0.75g TIW until 06/15/2024 for a total of an 8-week course. Also started pip/Tazo > ceftaz as of 9/8 based on bone culture + proteus - continue ceftaz 1g TIW through 10/16 per ID ESRD -  on HD MWF. Next HD 9/15  Hypertension/volume  - BP acceptable. Not overloaded on exam. EDW recently lowered in outpatient. Requiring minimal UF and is on midodrine  Anemia of CKD - Hgb 8-9s, no IV iron  while getting IV abx for severe infection. ON ESA here - aranesp  200 given 9/7. Secondary Hyperparathyroidism -  Corr Ca is ok and phos on lower side. No binders for now. On calcitriol .  Nutrition - Renal diet with fluid restriction; RD on board   Dipso plan is SNF/LTACH on d/c.  Ok for d/c from nephrology perspective.   Manuelita Barters MD [facility] Pager [phone]

## 2024-06-06 NOTE — Plan of Care (Signed)

## 2024-06-07 NOTE — Progress Notes (Addendum)
 Called ortho tech [phone] Asked ortho tech for xl shrinker for pt

## 2024-06-07 NOTE — Progress Notes (Signed)
 Pt wants pneumo vaccine at discharge

## 2024-06-07 NOTE — TOC Progression Note (Signed)
 Transition of Care [facility]) - Progression Note    Patient Details  Name: MAXIE DEBOSE MRN: [id number] Date of Birth: 06-06-1956  [facility] of [facility]) CM/SW Contact  Farhaan Mabee [place], [place] Phone Number: 06/07/2024, 9:05 AM  Clinical Narrative:     Met with patient at bedside to discuss bed offers. Patient agreeable to return to [facility]. [facility] will need to start authorization. Left voicemail message for Damien at [facility] regarding bed acceptance and need to start authorization.  Aneli Zara, LCSW Transition of Care    Expected Discharge Plan: Skilled Nursing Facility Barriers to Discharge: Continued Medical Work up, English as a second language teacher, SNF Pending bed offer               Expected Discharge Plan and Services       Living arrangements for the past 2 months: Single Family Home                                       Social Drivers of Health (SDOH) Interventions SDOH Screenings   Food Insecurity: No Food Insecurity (05/29/2024)  Housing: Low Risk  (05/29/2024)  Transportation Needs: No Transportation Needs (05/29/2024)  Utilities: Not At Risk (05/29/2024)  Depression (PHQ2-9): Low Risk  (02/28/2021)  Financial Resource Strain: Low Risk  (08/25/2021)   Received from [facility]  Physical Activity: Insufficiently Active (06/20/2021)   Received from [facility]  Social Connections: Moderately Integrated (05/29/2024)  Recent Concern: Social Connections - Socially Isolated (04/19/2024)  Stress: No Stress Concern Present (08/25/2021)   Received from [facility]  Tobacco Use: Low Risk  (05/29/2024)    Readmission Risk Interventions     No data to display

## 2024-06-07 NOTE — Progress Notes (Signed)
 HD#9 SUBJECTIVE:  Patient Summary: [name] is a 68 y.o. 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   Overnight Events: No acute events overnight  Interim History: The patient was seen and examined at the bedside this morning.  She is feeling better and reports that her pain is manageable if she is able to offload the weight. She is looking forward to transitioning to SNF next week if possible. All questions and concerns were addressed at this time.   OBJECTIVE:  Vital Signs: Vitals:   06/06/24 1645 06/06/24 2110 06/07/24 0433 06/07/24 0746  BP: (!) 101/45 (!) 113/58 109/62 (!) 107/93  Pulse: 88 81 79 79  Resp: 17 18 19 18   Temp: 98.3 F (36.8 C) 97.8 F (36.6 C) 97.7 F (36.5 C) 97.9 F (36.6 C)  TempSrc: Oral Oral Oral Oral  SpO2: 100% 100% 100% 99%  Weight:      Height:       Supplemental O2: Room Air SpO2: 99 % O2 Flow Rate (L/min): 2 L/min  Filed Weights   06/05/24 1420  Weight: (S) 65.8 kg     Intake/Output Summary (Last 24 hours) at 06/07/2024 0834 Last data filed at 06/06/2024 1200 Gross per 24 hour  Intake 236 ml  Output --  Net 236 ml    Net IO Since Admission: -2,619.04 mL [06/07/24 0834]  Physical Exam: Const: Awake, alert in NAD HENT: Normocephalic, atraumatic, mucus membranes moist Card: RRR, No MRG, No pitting edema on LE's bilaterally  Resp: No increased work of breathing Abd: Soft, NTND  Extremities: Warm. Right BKA noted. Dressing C/D/I. Non tender to palpation   Patient Lines/Drains/Airways Status     Active Line/Drains/Airways     Name Placement date Placement time Site Days   Peripheral IV 05/29/24 20 G Right Antecubital 05/29/24  1032  Antecubital  1   Hemodialysis Catheter Left Internal jugular Double lumen Permanent (Tunneled)  04/23/24  1619  Internal jugular  37   Colostomy RUQ 11/02/23  1100  RUQ  210   Wound 04/20/24 1833 Surgical Closed Surgical Incision Knee Right 04/20/24  1833  Knee  40   Wound 05/29/24 2033 Pressure Injury Sacrum Medial Stage 4 - Full thickness tissue loss with exposed bone, tendon or muscle. 05/29/24  2033  Sacrum  1            Pertinent labs and imaging:      Latest Ref Rng & Units 06/05/2024   12:59 PM 06/04/2024    4:00 AM 06/03/2024    8:37 AM  CBC  WBC 4.0 - 10.5 K/uL 11.8  11.4  12.3   Hemoglobin 12.0 - 15.0 g/dL 9.6  8.3  8.8   Hematocrit 36.0 - 46.0 % 32.5  27.4  29.2   Platelets 150 - 400 K/uL 420  319  407        Latest Ref Rng & Units 06/06/2024    2:56 AM 06/03/2024    8:37 AM 06/02/2024    2:54 AM  CMP  Glucose 70 - 99 mg/dL 70 - 99 mg/dL 77    78  827  81   BUN 8 - 23 mg/dL 8 - 23 mg/dL 20    20  19  8    Creatinine 0.44 - 1.00 mg/dL 9.55 -  1.00 mg/dL 7.25    7.27  5.62  7.12   Sodium 135 - 145 mmol/L 135 - 145 mmol/L 134    132  136  134   Potassium 3.5 - 5.1 mmol/L 3.5 - 5.1 mmol/L 3.8    3.7  3.9  3.3   Chloride 98 - 111 mmol/L 98 - 111 mmol/L 99    98  100  95   CO2 22 - 32 mmol/L 22 - 32 mmol/L 22    23  24  28    Calcium  8.9 - 10.3 mg/dL 8.9 - 89.6 mg/dL 8.0    8.0  7.9  7.5     No results found.   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 RD following to ensure proper nutrition to optimize wound healing.   -Blood cultures negative and finalized -Continue dilaudid  q6 PRN and scheduled tylenol .   -Continue wound care -q2 turns with air mattress for pressure offloading - Blood culture obtained from wound at time of debridement positive for Proteus mirabilis and Enterococcus faecium. E.faecium resistant to vancomycin  however it is sensitive to linezolid .  Antibiotics adjusted to linezolid  for the a 6-week duration.  Continue ceftazidime  for Proteus mirabilis coverage through 10/16 with dialysis.  -As patient is on a long duration of linezolid  she will need weekly CBCs to monitor for myelosuppression. -Contact precautions in place.  #Malnutrition -Patient's albumin  on admission was less than 1.5. Dietary was consulted. -Ensure adjusted to Boost which the pt is tolerated better.  #Status post right BKA with wound dehiscence -Staples and dressing placed in the ED. -Per Dr. Harden, no surgical intervention and to continue daily dressing changes with Vashe 4x4 gauze with ace wrap. He will continue to follow outpatient.   #Hypotension -Improved with added midodrine  PRN -Continue Midodrine  daily if SBP <90 and pt symptomatic  #ESRD on HD -Nephrology following, appreciate their recommendations. -Continue MWF schedule  #Normocytic anemia #Iron  deficiency anemia #Anemia of chronic disease -Patient with chronic history of IDA likely secondary to multiple factors including iron  deficiency and chronic kidney disease. -Hgb stable -Continue EPO per nephrology's recommendations  #Right internal jugular DVT - Last hospitalization (July) the patient was found to have a right internal jugular DVT.  At that time her [facility] was removed and replaced. -Continue Eliquis  5 mg  twice daily  #History of diverticulitis #Status post ex lap with colostomy formation -Pt underwent exploratory laparotomy in February this year with subsequent colostomy formation. She is following with surgery outpatient.  -Colostomy in place and secure without signs of surrounding infection -Continue proper wound care  #Chronic A-fib -Continue Eliquis  5 mg twice daily  #Type 2 diabetes melitis -A1c 5.1 -Glucose stable with goal less than 180. -Will hold off on  insulin  coverage at this time and continue to monitor closely  Best Practice: Diet: Regular diet VTE: eliquis  Code: DNR/DNI  Disposition planning: Therapy Recs: Pending, DME: other pending DISPO: Anticipated discharge pending to LTAC pending clinical improvement.  Signature:  Schuyler Novak, DO [facility]  8:34 AM, 06/07/2024  On Call pager [phone]

## 2024-06-07 NOTE — Progress Notes (Addendum)
 [facility] Progress Note   Subjective:   Seen and examined patient in room.  No new issues.  Awaiting SNF or LTACH placement - says likely tomorrow.    Eating well now.   Objective Vitals:   06/06/24 1645 06/06/24 2110 06/07/24 0433 06/07/24 0746  BP: (!) 101/45 (!) 113/58 109/62 (!) 107/93  Pulse: 88 81 79 79  Resp: 17 18 19 18   Temp: 98.3 F (36.8 C) 97.8 F (36.6 C) 97.7 F (36.5 C) 97.9 F (36.6 C)  TempSrc: Oral Oral Oral Oral  SpO2: 100% 100% 100% 99%  Weight:      Height:       Physical Exam General: Awake, alert, on RA, NAD Heart: S1 and S2; No murmurs, gallops, or rubs Lungs: Clear throughout Extremities: No edema LLE; R BKA Dialysis Access: R [facility] c/d/i  Filed Weights   06/05/24 1420  Weight: (S) 65.8 kg    Intake/Output Summary (Last 24 hours) at 06/07/2024 0911 Last data filed at 06/06/2024 1200 Gross per 24 hour  Intake 236 ml  Output --  Net 236 ml     Additional Objective Labs: Basic Metabolic Panel: Recent Labs  Lab 06/02/24 0254 06/03/24 0837 06/06/24 0256  NA 134* 136 134*  132*  K 3.3* 3.9 3.8  3.7  CL 95* 100 99  98  CO2 28 24 22  23   GLUCOSE 81 172* 77  78  BUN 8 19 20  20   CREATININE 2.87* 4.37* 2.74*  2.72*  CALCIUM  7.5* 7.9* 8.0*  8.0*  PHOS 2.1* 1.9* 2.3*   Liver Function Tests: Recent Labs  Lab 06/02/24 0254 06/03/24 0837 06/06/24 0256  ALBUMIN  1.5* 1.8* 1.7*   No results for input(s): LIPASE, AMYLASE in the last 168 hours. CBC: Recent Labs  Lab 06/01/24 0339 06/02/24 0254 06/03/24 0837 06/04/24 0400 06/05/24 1259  WBC 10.0 12.9* 12.3* 11.4* 11.8*  NEUTROABS 7.2  --   --   --  9.8*  HGB 9.0* 8.9* 8.8* 8.3* 9.6*  HCT 29.7* 29.5* 29.2* 27.4* 32.5*  MCV 85.3 84.0 84.9 84.6 87.4  PLT 341 325 407* 319 420*   Blood Culture    Component Value Date/Time   SDES BONE 05/29/2024 1904   SPECREQUEST NONE 05/29/2024 1904   CULT  05/29/2024 1904    FEW PROTEUS MIRABILIS FEW ENTEROCOCCUS  FAECIUM VANCOMYCIN  RESISTANT ENTEROCOCCUS ISOLATED NO ANAEROBES ISOLATED Performed at [facility], [address] [place], [place] [address]    REPTSTATUS 06/05/2024 FINAL 05/29/2024 1904    Cardiac Enzymes: No results for input(s): CKTOTAL, CKMB, CKMBINDEX, TROPONINI in the last 168 hours. CBG: Recent Labs  Lab 06/04/24 0855 06/04/24 1702 06/04/24 2100 06/05/24 0829 06/06/24 0832  GLUCAP 162* 165* 149* 125* 130*   Iron  Studies:  No results for input(s): IRON , TIBC, TRANSFERRIN, FERRITIN in the last 72 hours.  Lab Results  Component Value Date   INR 1.1 04/19/2024   INR >10.0 (HH) 10/28/2023   INR 1.32 02/13/2013   Studies/Results: No results found.   Medications:  cefTAZidime  (FORTAZ )  IV 1 g (06/05/24 1912)    acetaminophen   1,000 mg Oral QID   apixaban   5 mg Oral BID   ascorbic acid   500 mg Oral BID   brimonidine   1 drop Both Eyes BID   calcitRIOL   0.25 mcg Oral Q M,W,F-HD   Chlorhexidine  Gluconate Cloth  6 each Topical Q0600   collagenase    Topical BID   darbepoetin (ARANESP ) injection - DIALYSIS  200 mcg Subcutaneous Q Sun-1800   feeding supplement  1 Container Oral TID BM   feeding supplement  237 mL Oral BID BM   fiber supplement (BANATROL TF)  60 mL Oral BID   latanoprost   1 drop Both Eyes QHS   linezolid   600 mg Oral Q12H   metoprolol  tartrate  25 mg Oral BID   multivitamin  1 tablet Oral QHS   nutrition supplement (JUVEN)  1 packet Oral BID BM   zinc  sulfate (50mg  elemental zinc )  220 mg Oral Daily    Dialysis Orders: MWF- [facility] 4hrs, BFR 350, DFR Auto 1.5,  EDW 67.1kg, 3K/ 2.5Ca Heparin  2500 unit bolus with hD Mircera 225 mcg q2wks - last 05/15/24 Calcitriol  0.25mcg PO qHD - last dose 05/27/24   Assessment/Plan: R BKA wound dehiscence: repaired with staples and dressing in the ED 9/5. Wound care following Sacral decubitus ulcer: S/p wound debridement by Surgery PA 9/5. Surgery recommended ID  consult. CT ABD/pelvis showed osteo. Per Primary, pt was discharged on 8/5 on IV vancomycin  with HD for MRSA bacteremia coverage by ID. Vanc was changed to po linezolid  this week. Also started pip/Tazo > ceftaz as of 9/8 based on bone culture + proteus - continue ceftaz 1g TIW through 10/16 per ID. ESRD -  on HD MWF. Next HD 9/15 prior to d/c put note for 1st shift Hypertension/volume  - BP acceptable. Not overloaded on exam. EDW recently lowered in outpatient. Requiring minimal UF and is on midodrine  Anemia of CKD - Hgb 8-9s, no IV iron  while getting IV abx for severe infection. ON ESA here - aranesp  200 given 9/7. Secondary Hyperparathyroidism -  Corr Ca is ok and phos on lower side. No binders for now. On calcitriol .  Nutrition - Renal diet with fluid restriction; RD on board   Dipso plan is SNF/LTACH on d/c.  Ok for d/c from nephrology perspective.   Manuelita Barters MD [facility] Pager [phone]

## 2024-06-07 NOTE — Progress Notes (Signed)
 Mobility Specialist Progress Note:    06/07/24 1225  Mobility  Activity Ambulated with assistance (In hallway)  Level of Assistance Minimal assist, patient does 75% or more  Assistive Device Wheelchair  Distance Ambulated (ft) 270 ft  RLE Weight Bearing Per Provider Order NWB  Activity Response Tolerated well  Mobility Referral Yes  Mobility visit 1 Mobility  Mobility Specialist Start Time (ACUTE ONLY) 1104  Mobility Specialist Stop Time (ACUTE ONLY) 1124  Mobility Specialist Time Calculation (min) (ACUTE ONLY) 20 min   Received pt EOB and agreeable to mobility. Pt required MinA to transfer to wheelchair d/t fatigue, otherwise standby. Pt propelled self in hallway. C/o nausea. Returned to room without fault. Pt left EOB with personal belongings and call light within reach. All needs met.   Lavanda Pollack Mobility Specialist  Please contact via [employer] or  Rehab Office [phone]

## 2024-06-08 ENCOUNTER — Telehealth: Payer: Self-pay

## 2024-06-08 LAB — CBC
HCT: 32 % — ABNORMAL LOW (ref 36.0–46.0)
Hemoglobin: 9.8 g/dL — ABNORMAL LOW (ref 12.0–15.0)
MCH: 26 pg (ref 26.0–34.0)
MCHC: 30.6 g/dL (ref 30.0–36.0)
MCV: 84.9 fL (ref 80.0–100.0)
Platelets: 502 K/uL — ABNORMAL HIGH (ref 150–400)
RBC: 3.77 MIL/uL — ABNORMAL LOW (ref 3.87–5.11)
RDW: 22.7 % — ABNORMAL HIGH (ref 11.5–15.5)
WBC: 10.9 K/uL — ABNORMAL HIGH (ref 4.0–10.5)
nRBC: 0.2 % (ref 0.0–0.2)

## 2024-06-08 LAB — RENAL FUNCTION PANEL
Albumin: 1.8 g/dL — ABNORMAL LOW (ref 3.5–5.0)
Anion gap: 11 (ref 5–15)
BUN: 53 mg/dL — ABNORMAL HIGH (ref 8–23)
CO2: 21 mmol/L — ABNORMAL LOW (ref 22–32)
Calcium: 8.6 mg/dL — ABNORMAL LOW (ref 8.9–10.3)
Chloride: 99 mmol/L (ref 98–111)
Creatinine, Ser: 4.91 mg/dL — ABNORMAL HIGH (ref 0.44–1.00)
GFR, Estimated: 9 mL/min — ABNORMAL LOW (ref >60)
Glucose, Bld: 85 mg/dL (ref 70–99)
Phosphorus: 4 mg/dL (ref 2.5–4.6)
Potassium: 5 mmol/L (ref 3.5–5.1)
Sodium: 131 mmol/L — ABNORMAL LOW (ref 135–145)

## 2024-06-08 LAB — GLUCOSE, CAPILLARY: Glucose-Capillary: 143 mg/dL — ABNORMAL HIGH (ref 70–99)

## 2024-06-08 MED ORDER — CHLORHEXIDINE GLUCONATE CLOTH 2 % EX PADS
6.0000 | MEDICATED_PAD | Freq: Every day | CUTANEOUS | Status: DC
  Administered 2024-06-08 – 2024-06-09 (×2): 6 via TOPICAL

## 2024-06-08 MED ORDER — HEPARIN SODIUM (PORCINE) 1000 UNIT/ML IJ SOLN
INTRAMUSCULAR | Status: AC
  Filled 2024-06-08: qty 1

## 2024-06-08 MED ORDER — HEPARIN SODIUM (PORCINE) 1000 UNIT/ML IJ SOLN
INTRAMUSCULAR | Status: AC
  Filled 2024-06-08: qty 6

## 2024-06-08 NOTE — Plan of Care (Signed)

## 2024-06-08 NOTE — Progress Notes (Signed)
 Contacted by pharmacy regarding med change. Contacted out-pt clinic [facility] to make sure they do have ceftazidime  for upcoming treatments. Provided dosage and days of week at this time, Informed of no more vanc as well. Clinic states they do have this drug available for this pt. Will continue to assist as needed.   Lavanda Shakeem Stern Dialysis Navigator [phone]

## 2024-06-08 NOTE — Progress Notes (Signed)
 Physical Therapy Treatment Patient Details Name: [name] MRN: [id number] DOB: 09-15-1956 Today's Date: 06/08/2024   History of Present Illness 68 y.o. female presentsd to [facility] hospital on 05/29/2024 with R BKA wound dehiscence.PMHx: ESRD on HD, colostomy, T2DM, Afib, Rt ankle ORIF, CAD    PT Comments  PT session focused on transfers and review of RLE HEP. Pt required CGA to supervision bed mobility and min assist pivot transfers. Deferred w/c transfer due to awaiting transport for first shift HD.     If plan is discharge home, recommend the following: A little help with walking and/or transfers;A little help with bathing/dressing/bathroom;Assistance with cooking/housework;Assist for transportation;Help with stairs or ramp for entrance   Can travel by private vehicle        Equipment Recommendations  Hospital bed;Other (comment) (power wheelchair (rental until able to get prosthetic))    Recommendations for Other Services       Precautions / Restrictions Precautions Precautions: Fall;Other (comment) Precaution/Restrictions Comments: R BKA, sacral wound, colostomy Restrictions RLE Weight Bearing Per Provider Order: Non weight bearing     Mobility  Bed Mobility Overal bed mobility: Needs Assistance Bed Mobility: Supine to Sit, Sit to Supine     Supine to sit: Contact guard, Used rails Sit to supine: Supervision, Used rails        Transfers Overall transfer level: Needs assistance Equipment used: None Transfers: Bed to chair/wheelchair/BSC, Sit to/from Stand Sit to Stand: Min assist     Squat pivot transfers: Min assist     General transfer comment: assist to power up and initiate pivot. Therapist stabilizing BSC for transfer back to bed    Ambulation/Gait                   Stairs             Wheelchair Mobility     Tilt Bed    Modified Rankin (Stroke Patients Only)       Balance Overall balance assessment: Needs  assistance Sitting-balance support: No upper extremity supported, Feet supported Sitting balance-Leahy Scale: Good     Standing balance support: Bilateral upper extremity supported, Reliant on assistive device for balance Standing balance-Leahy Scale: Poor                              Communication Communication Communication: No apparent difficulties  Cognition Arousal: Alert Behavior During Therapy: WFL for tasks assessed/performed   PT - Cognitive impairments: No apparent impairments                         Following commands: Intact      Cueing Cueing Techniques: Verbal cues  Exercises Amputee Exercises Quad Sets: AROM, Right, 10 reps Hip ABduction/ADduction: AROM, Right, 10 reps Knee Flexion: AROM, Right, 10 reps    General Comments        Pertinent Vitals/Pain Pain Assessment Pain Assessment: Faces Faces Pain Scale: Hurts a little bit Pain Location: bottom Pain Descriptors / Indicators: Sore Pain Intervention(s): Monitored during session, Repositioned    Home Living                          Prior Function            PT Goals (current goals can now be found in the care plan section) Acute Rehab PT Goals Patient Stated Goal: heal and get her prosthesis Progress  towards PT goals: Progressing toward goals    Frequency    Min 2X/week      PT Plan      Co-evaluation              AM-PAC PT 6 Clicks Mobility   Outcome Measure  Help needed turning from your back to your side while in a flat bed without using bedrails?: A Little Help needed moving from lying on your back to sitting on the side of a flat bed without using bedrails?: A Little Help needed moving to and from a bed to a chair (including a wheelchair)?: A Little Help needed standing up from a chair using your arms (e.g., wheelchair or bedside chair)?: A Little Help needed to walk in hospital room?: Total Help needed climbing 3-5 steps with a  railing? : Total 6 Click Score: 14    End of Session   Activity Tolerance: Patient tolerated treatment well Patient left: in bed;with call bell/phone within reach Nurse Communication: Mobility status PT Visit Diagnosis: Other abnormalities of gait and mobility (R26.89);Muscle weakness (generalized) (M62.81)     Time: 9256-9243 PT Time Calculation (min) (ACUTE ONLY): 13 min  Charges:    $Therapeutic Activity: 8-22 mins PT General Charges $$ ACUTE PT VISIT: 1 Visit                     Sari MATSU., PT  Office # [phone]    Erven Sari Shaker 06/08/2024, 8:02 AM

## 2024-06-08 NOTE — Progress Notes (Signed)
 HD#10 SUBJECTIVE:  Patient Summary: [name] is a 68 y.o. 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   Overnight Events: No acute events overnight  Interim History: The patient was seen and examined at the bedside this morning.  She is sitting up and interactive with the team today saying that she is feeling much better trying to take ownership of her own care and is looking forward to transitioning to SNF.  All questions and concerns were addressed at this time.  OBJECTIVE:  Vital Signs: Vitals:   06/08/24 0735 06/08/24 0845 06/08/24 0902 06/08/24 0930  BP: 119/64 (!) 134/59 138/63 (!) 141/79  Pulse: 80 73 76 74  Resp: 16 16 13 15   Temp: (!) 97.3 F (36.3 C) 98.6 F (37 C)    TempSrc: Oral Oral    SpO2: 100% 100% 100% 100%  Weight:      Height:       Supplemental O2: Room Air SpO2: 100 % O2 Flow Rate (L/min): 2 L/min  Filed Weights   06/05/24 1420  Weight: (S) 65.8 kg    No intake or output data in the 24 hours ending 06/08/24 0936   Net IO Since Admission: -2,619.04 mL [06/08/24 0936]  Physical Exam: Const: Awake, alert in NAD HENT: Normocephalic, atraumatic, mucus membranes moist Card: RRR, No MRG, No pitting edema on LE's bilaterally  Resp: No increased work of breathing Abd: Soft, NTND, colostomy in place and nontender to palpation. Extremities: Warm. Right BKA noted. Dressing C/D/I. Non tender to palpation   Patient Lines/Drains/Airways Status     Active Line/Drains/Airways     Name Placement date Placement time Site Days   Peripheral IV 05/29/24 20 G Right Antecubital 05/29/24  1032  Antecubital  1   Hemodialysis Catheter Left Internal jugular Double lumen Permanent (Tunneled) 04/23/24  1619  Internal jugular  37   Colostomy RUQ 11/02/23  1100  RUQ   210   Wound 04/20/24 1833 Surgical Closed Surgical Incision Knee Right 04/20/24  1833  Knee  40   Wound 05/29/24 2033 Pressure Injury Sacrum Medial Stage 4 - Full thickness tissue loss with exposed bone, tendon or muscle. 05/29/24  2033  Sacrum  1            Pertinent labs and imaging:      Latest Ref Rng & Units 06/08/2024    3:03 AM 06/05/2024   12:59 PM 06/04/2024    4:00 AM  CBC  WBC 4.0 - 10.5 K/uL 10.9  11.8  11.4   Hemoglobin 12.0 - 15.0 g/dL 9.8  9.6  8.3   Hematocrit 36.0 - 46.0 % 32.0  32.5  27.4   Platelets 150 - 400 K/uL 502  420  319        Latest Ref Rng & Units 06/08/2024    3:03 AM 06/06/2024    2:56 AM 06/03/2024    8:37 AM  CMP  Glucose 70 - 99 mg/dL 85  77    78  827   BUN 8 - 23 mg/dL 53  20    20  19    Creatinine 0.44 - 1.00 mg/dL 5.08  7.25    7.27  5.62   Sodium 135 - 145 mmol/L 131  134    132  136  Potassium 3.5 - 5.1 mmol/L 5.0  3.8    3.7  3.9   Chloride 98 - 111 mmol/L 99  99    98  100   CO2 22 - 32 mmol/L 21  22    23  24    Calcium  8.9 - 10.3 mg/dL 8.6  8.0    8.0  7.9     No results found.   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 RD following to ensure proper  nutrition to optimize wound healing.   -Blood cultures negative and finalized -Continue dilaudid  q6 PRN and scheduled tylenol .   -Continue wound care -q2 turns with air mattress for pressure offloading - Bone culture obtained from wound at time of debridement positive for Proteus mirabilis and VRE: Enterococcus faecium. Antibiotics adjusted to linezolid  for the a 6-week duration.  Continue ceftazidime  for Proteus mirabilis coverage through 10/16 with dialysis.  -As patient is on a long duration of linezolid  she will need weekly CBCs to monitor for myelosuppression. -Contact precautions in place.  #Malnutrition -Patient's albumin  on admission was less than 1.5. Dietary was consulted. -Ensure adjusted to Boost which the pt is tolerating better.  #Status post right BKA with wound dehiscence -Staples and dressing placed in the ED. -Per Dr. Harden, no surgical intervention and to continue daily dressing changes with Vashe 4x4 gauze with ace wrap. He will continue to follow outpatient.   #Hypotension -Improved with added midodrine  PRN -Continue Midodrine  daily if SBP <90 and pt symptomatic  #ESRD on HD -Nephrology following, appreciate their recommendations. -Continue MWF schedule  #Normocytic anemia #Iron  deficiency anemia #Anemia of chronic disease -Patient with chronic history of IDA likely secondary to multiple factors including iron  deficiency and chronic kidney disease. -Hgb stable -Continue EPO per nephrology's recommendations  #Right internal jugular DVT - Last hospitalization (July) the patient was found to have a right internal jugular DVT.  At that time her [facility] was removed and replaced. -Continue Eliquis  5 mg twice daily  #History of diverticulitis #Status post ex lap with colostomy formation -Pt underwent exploratory laparotomy in February this year with subsequent colostomy formation. She is following with surgery outpatient.  -Colostomy in place and secure without signs  of surrounding infection -Continue proper wound care  #Chronic A-fib -Continue Eliquis  5 mg twice daily  #Type 2 diabetes melitis -A1c 5.1 -Glucose stable with goal less than 180. -Will hold off on insulin  coverage at this time and continue to monitor closely  Best Practice: Diet: Regular diet VTE: eliquis  Code: DNR/DNI  Disposition planning: Therapy Recs: Pending, DME: other pending DISPO: Anticipated discharge pending to LTAC pending clinical improvement. Insurance Auth pending  Signature:  Schuyler Novak, DO [facility]  9:36 AM, 06/08/2024  On Call  pager [phone]

## 2024-06-08 NOTE — Progress Notes (Signed)
 [facility] Progress Note   Subjective:  Seen on HD, 1L UFG and tolerating. No CP/dyspnea. Ongoing sacral pain.    Objective Vitals:   06/08/24 1100 06/08/24 1130 06/08/24 1200 06/08/24 1230  BP: 107/62 115/62 (!) 109/54 (!) 104/51  Pulse: 75 81 79 80  Resp:  15  15  Temp:      TempSrc:      SpO2: 100% 100% 99% 99%  Weight:      Height:       Physical Exam General: Frail woman, NAD. RA Heart: RRR; no murmur Lungs: CTA anteriorly, no rales Abdomen: soft Extremities: no LE edema Dialysis Access:  R [facility]  Additional Objective Labs: Basic Metabolic Panel: Recent Labs  Lab 06/03/24 0837 06/06/24 0256 06/08/24 0303  NA 136 134*  132* 131*  K 3.9 3.8  3.7 5.0  CL 100 99  98 99  CO2 24 22  23  21*  GLUCOSE 172* 77  78 85  BUN 19 20  20  53*  CREATININE 4.37* 2.74*  2.72* 4.91*  CALCIUM  7.9* 8.0*  8.0* 8.6*  PHOS 1.9* 2.3* 4.0   Liver Function Tests: Recent Labs  Lab 06/03/24 0837 06/06/24 0256 06/08/24 0303  ALBUMIN  1.8* 1.7* 1.8*   CBC: Recent Labs  Lab 06/02/24 0254 06/03/24 0837 06/04/24 0400 06/05/24 1259 06/08/24 0303  WBC 12.9* 12.3* 11.4* 11.8* 10.9*  NEUTROABS  --   --   --  9.8*  --   HGB 8.9* 8.8* 8.3* 9.6* 9.8*  HCT 29.5* 29.2* 27.4* 32.5* 32.0*  MCV 84.0 84.9 84.6 87.4 84.9  PLT 325 407* 319 420* 502*   Blood Culture    Component Value Date/Time   SDES BONE 05/29/2024 1904   SPECREQUEST NONE 05/29/2024 1904   CULT  05/29/2024 1904    FEW PROTEUS MIRABILIS FEW ENTEROCOCCUS FAECIUM VANCOMYCIN  RESISTANT ENTEROCOCCUS ISOLATED NO ANAEROBES ISOLATED Performed at [facility], [address] [place], [place] [address]    REPTSTATUS 06/05/2024 FINAL 05/29/2024 1904   Medications:  cefTAZidime  (FORTAZ )  IV 1 g (06/05/24 1912)    acetaminophen   1,000 mg Oral QID   apixaban   5 mg Oral BID   ascorbic acid   500 mg Oral BID   brimonidine   1 drop Both Eyes BID   calcitRIOL   0.25 mcg Oral Q M,W,F-HD    Chlorhexidine  Gluconate Cloth  6 each Topical Q0600   collagenase    Topical BID   darbepoetin (ARANESP ) injection - DIALYSIS  200 mcg Subcutaneous Q Sun-1800   feeding supplement  1 Container Oral TID BM   feeding supplement  237 mL Oral BID BM   fiber supplement (BANATROL TF)  60 mL Oral BID   latanoprost   1 drop Both Eyes QHS   linezolid   600 mg Oral Q12H   metoprolol  tartrate  25 mg Oral BID   multivitamin  1 tablet Oral QHS   nutrition supplement (JUVEN)  1 packet Oral BID BM   zinc  sulfate (50mg  elemental zinc )  220 mg Oral Daily    Dialysis Orders MWF- [facility] 4hrs, BFR 350, DFR Auto 1.5,  EDW 67.1kg, 3K/ 2.5Ca Heparin  2500 unit bolus with hD Mircera 225 mcg q2wks - last 05/15/24 Calcitriol  0.25mcg PO qHD - last dose 05/27/24   Assessment/Plan: R BKA wound dehiscence: repaired with staples and dressing in the ED 9/5. Wound care following Sacral decubitus ulcer: S/p wound debridement by Surgery PA 9/5. Surgery recommended ID consult. CT ABD/pelvis showed osteo. Per  Primary, pt was discharged on 8/5 on IV vancomycin  with HD for MRSA bacteremia coverage by ID. Vanc was changed to po linezolid  this week. Also started pip/Tazo > ceftaz as of 9/8 based on bone culture + proteus - continue ceftaz 1g TIW through 10/16 per ID. ESRD -  on HD MWF - HD now, 1L UFG Hypertension/volume  - BP decent, no LE edema. On BB. Anemia of CKD - Hgb 9.8, no IV iron  while getting IV abx for severe infection. ON ESA here - aranesp  200 given 9/7. Secondary Hyperparathyroidism -  Corr Ca/Phso ok, continue VDRA, no binders. Nutrition - Renal diet with fluid restriction; RD on board   Dipso plan is SNF/LTACH on d/c.  Ok for d/c from nephrology perspective.    Izetta Boehringer, PA-C 06/08/2024, 12:36 PM  [facility]

## 2024-06-08 NOTE — Plan of Care (Signed)
  Problem: Pain Managment: Goal: General experience of comfort will improve and/or be controlled Outcome: Progressing   Problem: Safety: Goal: Ability to remain free from injury will improve Outcome: Progressing

## 2024-06-08 NOTE — Progress Notes (Signed)
 Patient returned from dialysis.  I got patient settled into her bed and took her vital signs.  I notified the provider Schuyler Novak DO of her BP being low.  BP 87/55 MAP-65.  Administered PRN midodrine . Will recheck BP.

## 2024-06-08 NOTE — Telephone Encounter (Unsigned)
 Copied from CRM #[id number]. Topic: General - Other >> Jun 03, 2024  5:20 PM Mercer PEDLAR wrote: Reason for CRM:  Bebe SAUNDERS calling from [facility] Would like callback from DO [facility].   Reference number: [id number] Callback: [phone]

## 2024-06-09 ENCOUNTER — Ambulatory Visit: Admitting: Internal Medicine

## 2024-06-09 LAB — VITAMIN A: Vitamin A (Retinoic Acid): 16.2 ug/dL — ABNORMAL LOW (ref 22.0–69.5)

## 2024-06-09 MED ORDER — APIXABAN 5 MG PO TABS: 5.0000 mg | ORAL_TABLET | Freq: Two times a day (BID) | ORAL | Status: DC

## 2024-06-09 MED ORDER — METOPROLOL TARTRATE 25 MG PO TABS: 25.0000 mg | ORAL_TABLET | Freq: Two times a day (BID) | ORAL | Status: DC

## 2024-06-09 MED ORDER — ASCORBIC ACID 500 MG PO TABS: 500.0000 mg | ORAL_TABLET | Freq: Two times a day (BID) | ORAL | Status: DC

## 2024-06-09 MED ORDER — MEDIHONEY WOUND/BURN DRESSING EX PSTE: 1.0000 | PASTE | Freq: Two times a day (BID) | CUTANEOUS | Status: DC

## 2024-06-09 MED ORDER — LINEZOLID 600 MG PO TABS: 600.0000 mg | ORAL_TABLET | Freq: Two times a day (BID) | ORAL | Status: DC

## 2024-06-09 MED ORDER — HYDROMORPHONE HCL 2 MG PO TABS: 1.0000 mg | ORAL_TABLET | Freq: Four times a day (QID) | ORAL | 0 refills | Status: DC | PRN

## 2024-06-09 MED ORDER — JUVEN PO PACK: 1.0000 | PACK | Freq: Two times a day (BID) | ORAL | Status: AC

## 2024-06-09 MED ORDER — DARBEPOETIN ALFA 200 MCG/0.4ML IJ SOSY: 200.0000 ug | PREFILLED_SYRINGE | INTRAMUSCULAR | Status: AC

## 2024-06-09 MED ORDER — COLLAGENASE 250 UNIT/GM EX OINT: TOPICAL_OINTMENT | Freq: Two times a day (BID) | CUTANEOUS | Status: DC

## 2024-06-09 MED ORDER — ZINC SULFATE 220 (50 ZN) MG PO CAPS: 220.0000 mg | ORAL_CAPSULE | Freq: Every day | ORAL | Status: AC

## 2024-06-09 MED ORDER — ACETAMINOPHEN 500 MG PO TABS: 1000.0000 mg | ORAL_TABLET | Freq: Four times a day (QID) | ORAL | Status: DC

## 2024-06-09 NOTE — Progress Notes (Signed)
 D/c orders noted. Contacted out-pt HD clinic [facility] [place]) to inform of pt d/c and that pt will likely resume at clinic tomorrow per normal schedule. No further assistance needed at this time.   Kalijah Zeiss Dialysis navigator [phone]

## 2024-06-09 NOTE — Discharge Planning (Signed)
 [facility] Patient Discharge Orders - [facility] CLINIC: [facility]  Patient's name: [name] Admit/DC Dates: 05/29/2024 - 06/09/24  DISCHARGE DIAGNOSES: R BKA wound dehiscence -> s/p repair/staples 9/5  Sacral osteomyelitis -> s/p debridement 9/5 -> will be finishing course of IV Cefazidime (see below)  HD ORDER CHANGES: Heparin  change: no EDW Change: YES New EDW: 66.5kg [place] Change: no  ANEMIA MANAGEMENT: Aranesp : Given: YES    Amount/Date of last dose: on 9/7 ESA dose for discharge: mircera 150 mcg IV q 2 weeks, to start on 9/17 (this week) IV Iron  dose at discharge: none until done with IV abx Transfusion: Given: no  BONE/MINERAL MEDICATIONS: Hectorol/Calcitriol  change: no Sensipar/Parsabiv change: no  ACCESS INTERVENTION/CHANGE: no Details:  RECENT LABS: Recent Labs  Lab 06/08/24 0303  HGB 9.8*  NA 131*  K 5.0  CALCIUM  8.6*  PHOS 4.0  ALBUMIN  1.8*    IV ANTIBIOTICS: Ceftazidime  1g q HD until 07/09/2024 for sacral ostemyelitis Details: Will also be completing PO Linezolid   OTHER ANTICOAGULATION: no Details:  OTHER/APPTS/LAB ORDERS: - Check K weekly for 1 month, last K was 5 but historically lower - D/c to SNF [facility])  D/C Meds to be reconciled by nurse after every discharge.  Completed By: Izetta Boehringer, PA-C [facility] Pager [phone]   Reviewed by: MD:______ RN_______

## 2024-06-09 NOTE — TOC Progression Note (Signed)
 Transition of Care [facility]) - Progression Note    Patient Details  Name: [name] MRN: [id number] Date of Birth: [birth year]  Transition of Care [facility]) CM/SW Contact  Ashyra Cantin LITTIE [place], [place] Phone Number: 06/09/2024, 11:02 AM  Clinical Narrative:    CSW spoke with Tammy with [facility], They have received insurance auth approval for [employer]. Pt can be discharged to facility when medically ready.   Expected Discharge Plan: Skilled Nursing Facility Barriers to Discharge: Continued Medical Work up, English as a second language teacher, SNF Pending bed offer               Expected Discharge Plan and Services       Living arrangements for the past 2 months: Single Family Home                                       Social Drivers of Health (SDOH) Interventions SDOH Screenings   Food Insecurity: No Food Insecurity (05/29/2024)  Housing: Low Risk  (05/29/2024)  Transportation Needs: No Transportation Needs (05/29/2024)  Utilities: Not At Risk (05/29/2024)  Depression (PHQ2-9): Low Risk  (02/28/2021)  Financial Resource Strain: Low Risk  (08/25/2021)   Received from [facility]  Physical Activity: Insufficiently Active (06/20/2021)   Received from [facility]  Social Connections: Moderately Integrated (05/29/2024)  Recent Concern: Social Connections - Socially Isolated (04/19/2024)  Stress: No Stress Concern Present (08/25/2021)   Received from [facility]  Tobacco Use: Low Risk  (05/29/2024)    Readmission Risk Interventions     No data to display

## 2024-06-09 NOTE — TOC Transition Note (Signed)
 Transition of Care [facility]) - Discharge Note   Patient Details  Name: [name] MRN: [id number] Date of Birth: 1956/05/07  Transition of Care [facility]) CM/SW Contact:  [name] [name] Phone Number: 06/09/2024, 2:29 PM   Clinical Narrative:    [facility] will [facility] to: [facility] Anticipated DC date: 06/09/24 Family notified: Yes Transport by: [facility]   Per MD patient ready for DC to [facility]. RN to call report prior to discharge [phone] Room 100B. RN, patient, patient's family, and facility notified of DC. Discharge Summary and FL2 sent to facility. DC packet on chart. Ambulance transport requested for patient.   CSW will sign off for now as social work intervention is no longer needed. Please consult us  again if new needs arise.     Final next level of care: Skilled Nursing Facility Barriers to Discharge: Barriers Resolved   Patient Goals and CMS Choice Patient states their goals for this hospitalization and ongoing recovery are:: SNF          Discharge Placement   Existing PASRR number confirmed : 06/09/24          Patient chooses bed at: Other - please specify in the comment section below: [facility]) Patient to be transferred to facility by: [facility] Name of family member notified: [name] Patient and family notified of of transfer: 06/09/24  Discharge Plan and Services Additional resources added to the After Visit Summary for                                       Social Drivers of Health (SDOH) Interventions SDOH Screenings   Food Insecurity: No Food Insecurity (05/29/2024)  Housing: Low Risk  (05/29/2024)  Transportation Needs: No Transportation Needs (05/29/2024)  Utilities: Not At Risk (05/29/2024)  Depression (PHQ2-9): Low Risk  (02/28/2021)  Financial Resource Strain: Low Risk  (08/25/2021)   Received from [facility]  Physical Activity: Insufficiently Active (06/20/2021)   Received from [facility]  Social Connections: Moderately  Integrated (05/29/2024)  Recent Concern: Social Connections - Socially Isolated (04/19/2024)  Stress: No Stress Concern Present (08/25/2021)   Received from [facility]  Tobacco Use: Low Risk  (05/29/2024)     Readmission Risk Interventions     No data to display

## 2024-06-09 NOTE — Progress Notes (Incomplete)
 Internal Medicine Attending:  I personally saw and examined the patient. Discussed with the resident and agree with the resident's findings and plan of care as documented in the resident's note. Will need good wound care, offloading, nutrition (her lab is 1.7-1.8), antibiotics (ceftaz/linezolid ).  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  Follow CBC weekly at HD.  SNF pending  Connie Reyes BROCKS, MD

## 2024-06-09 NOTE — Progress Notes (Signed)
 [facility] Progress Note   Subjective:   Seen in room earlier today. No CP/dyspnea. She felt like too much fluid was pulled with HD yesterday - was hypotensive afterwards - better now.  Objective Vitals:   06/08/24 2046 06/09/24 0500 06/09/24 0821 06/09/24 1001  BP: (!) 102/56 (!) 100/46 (!) 114/50 (!) 114/50  Pulse: 77 73 77   Resp:  18 17   Temp:  98.3 F (36.8 C) 98.4 F (36.9 C)   TempSrc:  Oral Oral   SpO2:  99% 100%   Weight:      Height:       Physical Exam General: Frail woman, NAD. RA Heart: RRR; no murmur Lungs: CTA anteriorly, no rales Abdomen: soft Extremities: no LE edema Dialysis Access:  R [facility]  Additional Objective Labs: Basic Metabolic Panel: Recent Labs  Lab 06/03/24 0837 06/06/24 0256 06/08/24 0303  NA 136 134*  132* 131*  K 3.9 3.8  3.7 5.0  CL 100 99  98 99  CO2 24 22  23  21*  GLUCOSE 172* 77  78 85  BUN 19 20  20  53*  CREATININE 4.37* 2.74*  2.72* 4.91*  CALCIUM  7.9* 8.0*  8.0* 8.6*  PHOS 1.9* 2.3* 4.0   Liver Function Tests: Recent Labs  Lab 06/03/24 0837 06/06/24 0256 06/08/24 0303  ALBUMIN  1.8* 1.7* 1.8*   CBC: Recent Labs  Lab 06/03/24 0837 06/04/24 0400 06/05/24 1259 06/08/24 0303  WBC 12.3* 11.4* 11.8* 10.9*  NEUTROABS  --   --  9.8*  --   HGB 8.8* 8.3* 9.6* 9.8*  HCT 29.2* 27.4* 32.5* 32.0*  MCV 84.9 84.6 87.4 84.9  PLT 407* 319 420* 502*   Blood Culture    Component Value Date/Time   SDES BONE 05/29/2024 1904   SPECREQUEST NONE 05/29/2024 1904   CULT  05/29/2024 1904    FEW PROTEUS MIRABILIS FEW ENTEROCOCCUS FAECIUM VANCOMYCIN  RESISTANT ENTEROCOCCUS ISOLATED NO ANAEROBES ISOLATED Performed at [facility], [address] [place], [place] [address]    REPTSTATUS 06/05/2024 FINAL 05/29/2024 1904    Medications:  cefTAZidime  (FORTAZ )  IV 1 g (06/08/24 1430)    acetaminophen   1,000 mg Oral QID   apixaban   5 mg Oral BID   ascorbic acid   500 mg Oral BID   brimonidine   1 drop  Both Eyes BID   calcitRIOL   0.25 mcg Oral Q M,W,F-HD   Chlorhexidine  Gluconate Cloth  6 each Topical Q0600   collagenase    Topical BID   darbepoetin (ARANESP ) injection - DIALYSIS  200 mcg Subcutaneous Q Sun-1800   feeding supplement  1 Container Oral TID BM   latanoprost   1 drop Both Eyes QHS   linezolid   600 mg Oral Q12H   metoprolol  tartrate  25 mg Oral BID   multivitamin  1 tablet Oral QHS   nutrition supplement (JUVEN)  1 packet Oral BID BM   zinc  sulfate (50mg  elemental zinc )  220 mg Oral Daily    Dialysis Orders MWF- [facility] 4hrs, BFR 350, DFR Auto 1.5,  EDW 67.1kg, 3K/ 2.5Ca Heparin  2500 unit bolus with hD Mircera 225 mcg q2wks - last 05/15/24 Calcitriol  0.25mcg PO qHD - last dose 05/27/24   Assessment/Plan: R BKA wound dehiscence: repaired with staples and dressing in the ED 9/5. Wound care following Sacral decubitus ulcer: S/p wound debridement by Surgery PA 9/5. Surgery recommended ID consult. CT ABD/pelvis showed osteo. Per Primary, pt was discharged on 8/5 on IV vancomycin  with  HD for MRSA bacteremia coverage by ID. Vanc was changed to PO linezolid . Zosyn  added, then changed to Ceftazidime  as of 9/8 based on bone culture + proteus - continue ceftaz 1g TIW through 10/16 per ID. ESRD -  on HD MWF -> for HD tomorrow, low UFG. Hypertension/volume  - BP decent, no LE edema. On BB. Anemia of CKD - Hgb 9.8, no IV iron  while getting IV abx for severe infection. Continue Aranesp  200mcg q Sunday. Secondary Hyperparathyroidism -  Corr Ca/Phso ok, continue VDRA, no binders. Nutrition - Renal diet with fluid restriction; RD on board      Izetta Boehringer, PA-C 06/09/2024, 1:30 PM  [employer]

## 2024-06-09 NOTE — Progress Notes (Addendum)
 Nutrition Follow-up  DOCUMENTATION CODES:   Not applicable  INTERVENTION:  Encourage PO intake - Currently on regular diet with thin liquids Smaller more frequent snacks Emphasis on Protein intake Boost Breeze po TID, each supplement provides 250 kcal and 9 grams of protein 1 packet Juven BID, each packet provides 95 calories, 2.5 grams of protein (collagen), to support wound healing Renal MVI daily 500 mg Vitamin C  BID x 30 days  220 mg Zinc  sulfate daily x 14 days  Provided colostomy nutrition therapy education and handout Provided high calorie, high protein handout in AVS D/C Ensure and Banatrol per pt's request    -RD will draw labs to assess for potential micronutrient deficiencies which may impede wound healing: vitamin A  (Pending)  NUTRITION DIAGNOSIS:   Increased nutrient needs related to wound healing as evidenced by estimated needs. - Ongoing  GOAL:   Patient will meet greater than or equal to 90% of their needs - Progressing   MONITOR:   PO intake, Supplement acceptance  REASON FOR ASSESSMENT:   Consult Assessment of nutrition requirement/status, Wound healing  ASSESSMENT:   Pt with PMH of HTN, CAD, a-fib, diabetes mellitus, ESRD on HD, Diabetic neuropathy, HLD, and diabetic retinopathy who presented with right lower extremity wound dehiscence and admitted for stage IV sacral decubitus ulcer  9/5- s/p bedside debridement of sacral pressure injury by general surgery, ortho placed staple on rt BKA area of dehiscence  9/6- hydrotherapy started   Pt in good spirits, is determined to get better. Has been doing well and increasing her po intake. She does reports on HD days (MWF) she does not have breakfast or lunch but does eat a good dinner. On non HD days she eats 100% of her 3 meals. Is drinking 2 Juven and 2 Boost Breeze per day. States Ensures go right through her. Did not have breakfast this morning but did have 1 Boost Breeze.   Provided colostomy  education including foods that will help slow and thicken stool output. Pt very grateful as she has never received this education in the past. Endorses no N/V. Reports having a good appetite. Pt want sto D/C Banatrol and try to eat real foods to help with her output. Encouraged increased po intake with small frequent meals and snacks.   Last HD yesterday, 1 L UF.   Disposition: SNF/LTAC  Admit weight: 71.4 kg Current weight: 65.8 kg? EDW:  67.1 kg  Average Meal Intake: 9/8-9/9: 50-75% intake x 2 recorded meals 9/10-9/13: 50% intake x 6 recorded meals   Nutritionally Relevant Medications: Scheduled Meds:  ascorbic acid   500 mg Oral BID   feeding supplement  1 Container Oral TID BM   multivitamin  1 tablet Oral QHS   nutrition supplement (JUVEN)  1 packet Oral BID BM   zinc  sulfate (50mg  elemental zinc )  220 mg Oral Daily   Continuous Infusions:  cefTAZidime  (FORTAZ )  IV 1 g (06/08/24 1430)   Labs Reviewed: Sodium 131 BUN 53 Creatinine 4.91 Calcium  8.6 GFR 9  CBG ranges from 130-143 mg/dL over the last 24 hours HgbA1c 5.1  Diet Order:   Diet Order             Diet regular Room service appropriate? Yes; Fluid consistency: Thin  Diet effective now                   EDUCATION NEEDS:   No education needs have been identified at this time  Skin:  Skin Assessment: Skin Integrity  Issues: Skin Integrity Issues:: Stage IV Stage IV: sacrum  Last BM:  9/15: 300 lm via colostomy  Height:   Ht Readings from Last 1 Encounters:  06/03/24 5' 7 (1.702 m)    Weight:   Wt Readings from Last 1 Encounters:  06/05/24 (S) 65.8 kg    Ideal Body Weight:  57.4 kg (adjusted for rt BKA)  BMI:  Body mass index is 22.71 kg/m.  Estimated Nutritional Needs:   Kcal:  2000-2200  Protein:  100-115 grams  Fluid:  1000 ml + UOP   Olivia Kenning, RD Registered Dietitian  See Amion for more information

## 2024-06-09 NOTE — Progress Notes (Signed)
 Physical Therapy Wound Treatment Patient Details  Name: [name] MRN: [id number] Date of Birth: 02-26-1956  Today's Date: 06/09/2024 Time: 1006-1045 Time Calculation (min): 39 min  Subjective  Subjective Assessment Patient and Family Stated Goals: healed up Date of Onset:  (last admission) Prior Treatments: last and this present admission  Pain Score:  Pt premedicated and denies any pain with wound care today.   Wound Assessment  Wound 05/29/24 2033 Pressure Injury Sacrum Medial Stage 4 - Full thickness tissue loss with exposed bone, tendon or muscle. (Active)  Wound Image   06/09/24 1100  Site / Wound Assessment Yellow;Red;Granulation tissue 06/09/24 1100  Peri-wound Assessment Erythema (blanchable) 06/09/24 1100  Wound Length (cm) 11 cm 06/05/24 1400  Wound Width (cm) 9.7 cm 06/05/24 1400  Wound Surface Area (cm^2) 83.8 cm^2 06/05/24 1400  Wound Depth (cm) 2.4 cm 06/05/24 1400  Wound Volume (cm^3) 134.083 cm^3 06/05/24 1400  Drainage Description Serous 06/09/24 1100  Drainage Amount Moderate 06/09/24 1100  Treatment Cleansed 06/09/24 1100  Dressing Type Foam - Lift dressing to assess site every shift;Gauze (Comment);Santyl ;Other (Comment);Barrier Film (skin prep) 06/09/24 1100  Dressing Changed Changed 06/08/24 2015  Dressing Status Clean, Dry, Intact 06/09/24 1100  State of Healing Early/partial granulation 06/09/24 1100  % Wound base Red or Granulating 50% 06/09/24 1100  % Wound base Yellow/Fibrinous Exudate 45% 06/09/24 1100  % Wound base Black/Eschar 0% 05/30/24 1414  % Wound base Other/Granulation Tissue (Comment) 5% 06/09/24 1100  Tunneling (cm) 3 cm at 8 o'clock 06/05/24 1400  Undermining (cm) circumfrential undermining 12 o'clock 1.7 cm, 2:00 o'clock 1.8 cm, 9 o'clock 2.1, 10 o'clock 3.1 06/05/24 1400  Margins Epibole (rolled edges) 06/09/24 1100   Selective Debridement (non-excisional) Selective Debridement (non-excisional) - Location: sacral Selective  Debridement (non-excisional) - Tools Used: Forceps, Scalpel, Scissors Selective Debridement (non-excisional) - Tissue Removed: yellow friable slough    Wound Assessment and Plan  Wound Therapy - Assess/Plan/Recommendations Wound Therapy - Clinical Statement: Wound looks significantly better today. Selective debridement and Santyl  have been successful in decreasing slough in the bed of the wound and increased red beefy tissue is visible throughout the wound. PT would recommend wound vac placement at this time but pt is to discharge to SNF today. Santyl  is not a part of the SNF formulary so recommend Medihoney be uses to continue necrotic tissue breakdown. Wound Therapy - Functional Problem List: new BKA, presently participateing with acute OT/PT.  Slow and effortful mobility, CGA and rail to get to EOB. Factors Delaying/Impairing Wound Healing: Altered sensation, Diabetes Mellitus Hydrotherapy Plan: Debridement, Dressing change, Patient/family education Wound Therapy - Frequency: 2X / week Wound Therapy - Current Recommendations: PT Wound Therapy - Follow Up Recommendations: dressing changes by RN  Wound Therapy Goals- Improve the function of patient's integumentary system by progressing the wound(s) through the phases of wound healing (inflammation - proliferation - remodeling) by: Wound Therapy Goals - Improve the function of patient's integumentary system by progressing the wound(s) through the phases of wound healing by: Decrease Necrotic Tissue to: 20 Decrease Necrotic Tissue - Progress: Progressing toward goal Increase Granulation Tissue to: 80 Increase Granulation Tissue - Progress: Progressing toward goal Time For Goal Achievement: 7 days Wound Therapy - Potential for Goals: Good  Goals will be updated until maximal potential achieved or discharge criteria met.  Discharge criteria: when goals achieved, discharge from hospital, MD decision/surgical intervention, no progress towards  goals, refusal/missing three consecutive treatments without notification or medical reason.  GP  Charges PT Wound Care Charges $Wound Debridement up to 20 cm: < or equal to 20 cm $ Wound Debridement each add'l 20 sqcm: 1 $PT Hydrotherapy Dressing: 1 dressing     Etna Forquer B. Fleeta Lapidus PT, DPT Acute Rehabilitation Services Please use secure chat or  Call Office [phone]   Almarie KATHEE Fleeta [facility] 06/09/2024, 11:55 AM

## 2024-06-09 NOTE — Discharge Summary (Addendum)
 Name: BRISTAL STEFFY MRN: [id number] DOB: 03/24/1956 68 y.o. PCP: Campbell Reynolds, NP  Date of Admission: 05/29/2024  9:46 AM Date of Discharge: 06/09/2024 Attending Physician: Dr. Reyes Fenton  Discharge Diagnosis: 1. 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 Commonly known as: Lantus  Inject 0.07 mLs (7 Units total) into the skin daily. Please schedule appointment before next refill. What changed: how much to take   NovoLIN R 100 UNIT/ML injection Wait to take this until your doctor or other care provider tells you to start again. Generic drug: insulin  regular Inject 0-9 Units into the skin in the morning, at noon, and at bedtime. Per sliding scale   NovoLOG  FlexPen 100 UNIT/ML FlexPen Wait to take this until your doctor or other care provider tells you to start again. Generic drug: insulin  aspart 0-9 Units, Subcutaneous, 3 times  daily with meals CBG < 70: Implement Hypoglycemia measures CBG 70 - 120: 0 units CBG 121 - 150: 1 unit CBG 151 - 200: 2 units CBG 201 - 250: 3 units CBG 251 - 300: 5 units CBG 301 - 350: 7 units CBG 351 - 400: 9 units CBG > 400: call MD       STOP taking these medications    carvedilol  12.5 MG tablet Commonly known as: COREG    oxyCODONE  5 MG immediate release tablet Commonly known as: Oxy IR/ROXICODONE    vancomycin  IVPB       TAKE these medications    Accu-Chek Aviva Plus test strip Generic drug: glucose blood CHECK BLOOD GLUCOSE THREE TIMES DAILY   Accu-Chek Aviva Plus w/Device Kit CHECK BLOOD GLUCOSE THREE TIMES DAILY   accu-chek softclix lancets Fill for 1 month for TID testing. Use as instructed   acetaminophen  500 MG tablet Commonly known as: TYLENOL  Take 2 tablets (1,000 mg total) by mouth 4 (four) times daily.   albuterol  (2.5 MG/3ML) 0.083% nebulizer solution Commonly known as: PROVENTIL  Take 3 mLs (2.5 mg total) by nebulization every 2 (two) hours as needed for wheezing.   apixaban  5 MG Tabs tablet Commonly known as: ELIQUIS  Take 1 tablet (5 mg total) by mouth 2 (two) times daily. What changed:  See the new instructions. Another medication with the same name was removed. Continue taking this medication, and follow the directions you see here.   ascorbic acid  500 MG tablet Commonly known as:  VITAMIN C  Take 1 tablet (500 mg total) by mouth 2 (two) times daily.   brimonidine  0.2 % ophthalmic solution Commonly known as: ALPHAGAN  Place 1 drop into both eyes 2 (two) times daily.   calcitRIOL  0.25 MCG capsule Commonly known as: ROCALTROL  TAKE 1 CAPSULE (0.25 MCG TOTAL) EVERY MONDAY, WEDNESDAY, AND FRIDAY What changed: See the new instructions.   cefTAZidime  1 g in sodium chloride  0.9 % 100 mL Inject 1 g into the vein every Monday, Wednesday, and Friday with hemodialysis. Stop date 07/09/24   collagenase  250 UNIT/GM ointment Commonly known as:  SANTYL  Apply topically 2 (two) times daily.   Darbepoetin Alfa  200 MCG/0.4ML Sosy injection Commonly known as: ARANESP  Inject 0.4 mLs (200 mcg total) into the skin every Sunday at 6pm. Start taking on: June 14, 2024   Droplet Insulin  Syringe 31G X 5/16 0.5 ML Misc Generic drug: Insulin  Syringe-Needle U-100 USE FOUR TIMES DAILY FOR INJECTIONS   HYDROmorphone  2 MG tablet Commonly known as: DILAUDID  Take 0.5 tablets (1 mg total) by mouth every 6 (six) hours as needed for severe pain (pain score 7-10) or moderate pain (pain score 4-6).   Insulin  Syringes (Disposable) U-100 0.5 ML Misc 4x daily injections   latanoprost  0.005 % ophthalmic solution Commonly known as: XALATAN  Place 1 drop into both eyes at bedtime.   leptospermum manuka honey Pste paste Apply 1 Application topically 2 (two) times daily. Apply with wound changes IF santyl  not available.   linezolid  600 MG tablet Commonly known as: ZYVOX  Take 1 tablet (600 mg total) by mouth every 12 (twelve) hours.   melatonin 5 MG Tabs Take 10 mg by mouth at bedtime as needed (for insomnia).   metoprolol  tartrate 25 MG tablet Commonly known as: LOPRESSOR  Take 1 tablet (25 mg total) by mouth 2 (two) times daily.   midodrine  2.5 MG tablet Commonly known as: PROAMATINE  Take 2.5 mg by mouth daily as needed (low bp).   multivitamin with minerals Tabs tablet Take 1 tablet by mouth daily.   NEEDLE (DISP) 30 G 30G X 1/2 Misc Commonly known as: BD Disp Needles For 4x daily injections   nutrition supplement (JUVEN) Pack Take 1 packet by mouth 2 (two) times daily between meals.   zinc  sulfate (50mg  elemental zinc ) 220 (50 Zn) MG capsule Take 1 capsule (220 mg total) by mouth daily. Start taking on: June 10, 2024               Discharge Care Instructions  (From admission, onward)           Start     Ordered   06/09/24 0000  Discharge wound care:       Comments: As above   06/09/24 1408             Disposition and follow-up:   Ms.Ragen L Bedgood was discharged from [facility] in Good condition.  At the hospital follow up visit please address:  Stage IV sacral decubitus ulcer positive Proteus mirabilis and VRE--the patient was discharged on 6 weeks of oral linezolid  and IV ceftazidime  to be given after dialysis both are to end on 10/16.  The patient is under going proper wound care and taking/receiving antibiotics as directed Type 2 diabetes--insulin  held due to good glycemic control while inpatient.  Will need reevaluation to see if this needs to be restarted outpatient. Malnutrition--patient presented to the ER with a albumin  of less than 1.5.  She was educated on the importance of good nutrition  and proper wound healing and has been prescribed dietary supplements to take.  She reported that she does have difficulty eating due to her colostomy and the frequency that she was having to change it.  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  Her staples were partly removed by Dr. Due to while inpatient due to wound draining.  Ensure she is undergoing proper wound care not developing signs of infection. Hypotension--the patient frequently becomes hypotensive after dialysis.  Ensure she is taking her midodrine  as prescribed and ensuring good oral intake.  2.  Labs / imaging needed at time of follow-up: CBC, RFP  Follow-up Appointments:  Contact information for follow-up providers     [facility] Follow up.   Specialty: Home Health Services Why: THey wilol call you to resume home health Contact information: [address] [place] [address] [phone]         Harden Jerona GAILS, MD Follow up in 1 week(s).   Specialty: Orthopedic Surgery Contact information: [address] [place]   [place] [address] [phone]              Contact information for after-discharge care     Destination     [facility] .   Service: Skilled Nursing Contact information: [address] [place]  [address] [phone]                      Hospital Course by problem list: ROCKELL FAULKS is a 68 y.o. 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  While in the emergency room she also complained of a wound on her backside which was further evaluated and found to be stage IV sacral decubitus ulcer.  She did undergo bedside debridement with general surgery who were able to send bone fragment for culture.  Blood cultures were also sent.  Blood cultures were negative after 5 days and wound culture did finalize on 9/12 growing with Proteus mirabilis and Enterococcus faecium that was resistant to vancomycin .  At this time she was placed on ceftazidime  and oral linezolid  for a 6 week course ending on 10/16.  She remained afebrile and without significant leukocytosis but here lower concern for systemic infection.  While hospitalized she did meet with the pressure dietitian due to an albumin  of less than 1.5.  She was given dietary supplements including Ensure with high-protein however the patient was unable to tolerate these so these were transition to the boost shakes.  She did show mild improvement with her  nutrition and increased albumin  throughout the her stay here.  She did undergo extensive wound care daily.  After lengthy stay due to pending insurance authorizations and awaiting approvals for transfer, she was accepted by [facility] with insurance approval and transferred on 9/16.  Was discharged  with her antibiotics and a pain regimen.  Right BKA Wound dehiscence Upon presentation to the ER on 9/5 she was complained that several staples that been removed after knocking her residual limbo surface.  This was evaluated in the ER and there was no concern for acute infection of the surgical site. Staples were replaced and the wound was dressed.  While admitted, the patient did note more purulent drainage from the wound.  It was evaluated and staples were removed per the advice of the orthopedic team.  Was cleaned and dressed and did not show signs of acute infection.  She did undergo extensive daily wound care here.  She was evaluated by the orthopedic team who reported that they she could follow-up outpatient without the need for acute intervention.  On 9/16 she was seen at the bedside and deemed stable for discharge.  Type 2 diabetes Her last A1c was 5.1 with adequate glucose control less than 180.  Her insulin  was held this admission and held upon discharge.  Will need to reevaluate outpatient to see if she will again need insulin  coverage.  R Internal jugular DVT The patient was found to have a right internal jugular DVT during her July hospitalization for which she was put on Eliquis  5 mg twice daily.  This was continued while she was inpatient and upon discharge.  Stable chronic medical conditions: ESRD on HD Anemia of chronic disease Right internal jugular DVT--continue Eliquis  5 mg twice daily  Subjective Patient was seen and examined at the bedside this morning.  She was feeling well stating that her pain is improving overall but she still has mild pain in the sacral region.  She was  frustrated that she did not get to leave yesterday but is excited to go potentially today.  We did discuss her discharge plans and her wound care to which she verbalized understanding.  All questions and concerns were addressed at this time.  Discharge Exam:   BP (!) 114/50   Pulse 77   Temp 98.4 F (36.9 C) (Oral)   Resp 17   Ht 5' 7 (1.702 m)   Wt (S) 65.8 kg Comment: Bed Scale  SpO2 100%   BMI 22.71 kg/m  Discharge exam:  Const: Awake, alert in NAD HENT: Normocephalic, atraumatic, mucus membranes moist Card: atrial fibrillation, No MRG, No pitting edema on LE's bilaterally  Resp: LCTAB, no increased work of breathing Abd: Soft, NTND, Bsx4, colostomy in place in LLQ without erythema or signs of infection Extremities: Warm, R BKA with compression sleeve and dressing c/d/i  Pertinent Labs, Studies, and Procedures:     Latest Ref Rng & Units 06/08/2024    3:03 AM 06/05/2024   12:59 PM 06/04/2024    4:00 AM  CBC  WBC 4.0 - 10.5 K/uL 10.9  11.8  11.4   Hemoglobin 12.0 - 15.0 g/dL 9.8  9.6  8.3   Hematocrit 36.0 - 46.0 % 32.0  32.5  27.4   Platelets 150 - 400 K/uL 502  420  319        Latest Ref Rng & Units 06/08/2024    3:03 AM 06/06/2024    2:56 AM 06/03/2024    8:37 AM  CMP  Glucose 70 - 99 mg/dL 85  77    78  827   BUN 8 - 23 mg/dL 53  20    20  19    Creatinine 0.44 - 1.00 mg/dL 5.08  7.25    7.27  5.62  Sodium 135 - 145 mmol/L 131  134    132  136   Potassium 3.5 - 5.1 mmol/L 5.0  3.8    3.7  3.9   Chloride 98 - 111 mmol/L 99  99    98  100   CO2 22 - 32 mmol/L 21  22    23  24    Calcium  8.9 - 10.3 mg/dL 8.6  8.0    8.0  7.9     CT ABDOMEN PELVIS W CONTRAST Result Date: 05/29/2024 CLINICAL DATA:  Sacral wound EXAM: CT ABDOMEN AND PELVIS WITH CONTRAST TECHNIQUE: Multidetector CT imaging of the abdomen and pelvis was performed using the standard protocol following bolus administration of intravenous contrast. RADIATION DOSE REDUCTION: This exam was performed  according to the departmental dose-optimization program which includes automated exposure control, adjustment of the mA and/or kV according to patient size and/or use of iterative reconstruction technique. CONTRAST:  75mL OMNIPAQUE  IOHEXOL  350 MG/ML SOLN COMPARISON:  CT abdomen and pelvis dated 11/06/2023 FINDINGS: Lower chest: Bibasilar patchy ground-glass opacities, likely atelectasis. No pleural effusion or pneumothorax demonstrated. Partially imaged multichamber cardiomegaly. Coronary artery calcifications. Hepatobiliary: No focal hepatic lesions. No intra or extrahepatic biliary ductal dilation. Cholelithiasis. Pancreas: 1.1 x 0.6 cm hypodensity in the pancreatic neck (3:26), not well seen on prior examinations in the absence of contrast enhancement. Spleen: Normal in size without focal abnormality. Adrenals/Urinary Tract: No adrenal nodules. No suspicious renal masses. No hydronephrosis. Multiple punctate nonobstructing left upper pole renal stones. No focal bladder wall thickening. Stomach/Bowel: Normal appearance of the stomach. Lower quadrant ostomy. No evidence of bowel wall thickening, distention, or inflammatory changes. Appendix is not discretely seen. Vascular/Lymphatic: Aortic atherosclerosis. No enlarged abdominal or pelvic lymph nodes. Reproductive: No adnexal masses. Other: Small volume presacral free fluid.  No fluid collection. Musculoskeletal: Diffuse body wall edema. Interval development of large sacral decubitus ulcer with cortical destruction of the underlying distal sacrococcygeal spine and foci of gas extending into the presacral region. Multilevel degenerative changes of the T9 butterfly vertebra. Spine. IMPRESSION: 1. Interval development of large sacral decubitus ulcer with cortical destruction of the underlying distal sacrococcygeal spine and foci of gas extending into the presacral region, consistent with osteomyelitis. 2. A 1.1 x 0.6 cm hypodensity in the pancreatic neck, not well  seen on prior examinations in the absence of contrast enhancement. 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 Recommend further evaluation with nonemergent contrast-enhanced MRI/MRCP. 3. Cholelithiasis. 4. Multiple punctate nonobstructing left upper pole renal stones. 5.  Aortic Atherosclerosis (ICD10-I70.0). Electronically Signed   By: Limin  Xu M.D.   On: 05/29/2024 17:49     Discharge Instructions: Discharge Instructions     Call MD for:  persistant nausea and vomiting   Complete by: As directed    Call MD for:  redness, tenderness, or signs of infection (pain, swelling, redness, odor or green/yellow discharge around incision site)   Complete by: As directed    Call MD for:  severe uncontrolled pain   Complete by: As directed    Call MD for:  temperature >100.4   Complete by: As directed    Diet - low sodium heart healthy   Complete by: As directed    Discharge instructions   Complete by: As directed    Thank you for allowing us  to be part of your care. You were hospitalized for a sacral wound. We treated you with wound care and antibiotics.  See the changes in your medications and management of your  chronic conditions below:  *For your wound: -We have STARTED you on these following antibiotics:  -Linezolid --take 1 tablet every 12 hours until 10/16  -Ceftazidime --This will be given after dialysis on MWF over the next 6 weeks  -It is important that you keep pressure off of the area.   *For your Wound care:  -We have STARTED you on these following medications: ON THE SACRUM: NS WD dressing changes with Kerlix BID, place santyl  on necrotic tissue prior to placing dressing once a day  -If santyl  unavailable at nursing facility, use medihoney, which we have sent a prescription for  ON THE RIGHT LEG Apply 4 x 4 gauze dampened with Vashe to the right below-knee amputation incision plus Ace wrap and a stump shrinker once per day  *For your Pain:  -Tylenol : take 1000mg  every 6 hours--this  works best when you take it around the clock.   -Hydromorphone : Take 1mg  up to every 6 hours as needed for pain  -Please see your orthopedist in 7 to 10 days  FOLLOW UP APPOINTMENTS: Please call your PCP to schedule a follow up with your family doctor: [phone]   Please make sure to take your antibiotics and keep pressure off of your bottom.   Please call your PCP or our clinic if you have any questions or concerns, we may be able to help and keep you from a long and expensive emergency room wait. Our clinic and after hours phone number is [phone]. The best time to call is Monday through Friday 9 am to 4 pm but there is always someone available 24/7 if you have an emergency. If you need medication refills please notify your pharmacy one week in advance and they will send us  a request.   We are glad you are feeling better,  Schuyler Novak Internal Medicine Inpatient Teaching Service at [facility]   Discharge wound care:   Complete by: As directed    As above   Increase activity slowly   Complete by: As directed        Signed: Novak Schuyler, DO 06/09/2024, 2:46 PM

## 2024-06-09 NOTE — TOC Progression Note (Signed)
 Transition of Care [facility]) - Progression Note    Patient Details  Name: [name] MRN: [id number] Date of Birth: 07-17-1956  Transition of Care [facility]) CM/SW Contact  Dazia Lippold LITTIE Mo[place], [place] Phone Number: 06/09/2024, 8:37 AM  Clinical Narrative:    [facility] started auth for pt on 9/15. CSW will continue to follow.   Expected Discharge Plan: Skilled Nursing Facility Barriers to Discharge: Continued Medical Work up, English as a second language teacher, SNF Pending bed offer               Expected Discharge Plan and Services       Living arrangements for the past 2 months: Single Family Home                                       Social Drivers of Health (SDOH) Interventions SDOH Screenings   Food Insecurity: No Food Insecurity (05/29/2024)  Housing: Low Risk  (05/29/2024)  Transportation Needs: No Transportation Needs (05/29/2024)  Utilities: Not At Risk (05/29/2024)  Depression (PHQ2-9): Low Risk  (02/28/2021)  Financial Resource Strain: Low Risk  (08/25/2021)   Received from [facility]  Physical Activity: Insufficiently Active (06/20/2021)   Received from [facility]  Social Connections: Moderately Integrated (05/29/2024)  Recent Concern: Social Connections - Socially Isolated (04/19/2024)  Stress: No Stress Concern Present (08/25/2021)   Received from [facility]  Tobacco Use: Low Risk  (05/29/2024)    Readmission Risk Interventions     No data to display

## 2024-06-09 NOTE — Plan of Care (Signed)

## 2024-06-09 NOTE — Plan of Care (Signed)
 Problem: Education: Goal: Ability to describe self-care measures that may prevent or decrease complications (Diabetes Survival Skills Education) will improve 06/09/2024 1702 by Jordon Bourquin K, RN Outcome: Adequate for Discharge 06/09/2024 1701 by Alayshia Marini K, RN Outcome: Adequate for Discharge 06/09/2024 1523 by Florian Dellis POUR, RN Outcome: Progressing Goal: Individualized Educational Video(s) 06/09/2024 1702 by Shatera Rennert K, RN Outcome: Adequate for Discharge 06/09/2024 1701 by Damarkus Balis K, RN Outcome: Adequate for Discharge 06/09/2024 1523 by Liani Caris K, RN Outcome: Progressing   Problem: Coping: Goal: Ability to adjust to condition or change in health will improve 06/09/2024 1702 by Dachelle Molzahn K, RN Outcome: Adequate for Discharge 06/09/2024 1701 by Florian Dellis POUR, RN Outcome: Adequate for Discharge 06/09/2024 1523 by Florian Dellis POUR, RN Outcome: Progressing   Problem: Fluid Volume: Goal: Ability to maintain a balanced intake and output will improve 06/09/2024 1702 by Maika Mcelveen K, RN Outcome: Adequate for Discharge 06/09/2024 1701 by Florian Dellis POUR, RN Outcome: Adequate for Discharge 06/09/2024 1523 by Florian Dellis POUR, RN Outcome: Progressing   Problem: Health Behavior/Discharge Planning: Goal: Ability to identify and utilize available resources and services will improve 06/09/2024 1702 by Blaise Grieshaber K, RN Outcome: Adequate for Discharge 06/09/2024 1701 by Florian Dellis POUR, RN Outcome: Adequate for Discharge 06/09/2024 1523 by Florian Dellis POUR, RN Outcome: Progressing Goal: Ability to manage health-related needs will improve 06/09/2024 1702 by Camille Dragan K, RN Outcome: Adequate for Discharge 06/09/2024 1701 by Florian Dellis POUR, RN Outcome: Adequate for Discharge 06/09/2024 1523 by Florian Dellis POUR, RN Outcome: Progressing   Problem: Metabolic: Goal: Ability to maintain appropriate glucose levels will improve 06/09/2024 1702 by Loetta Connelley,  Yojan Paskett K, RN Outcome: Adequate for Discharge 06/09/2024 1701 by Florian Dellis POUR, RN Outcome: Adequate for Discharge 06/09/2024 1523 by Florian Dellis POUR, RN Outcome: Progressing   Problem: Nutritional: Goal: Maintenance of adequate nutrition will improve 06/09/2024 1702 by Osiris Charles K, RN Outcome: Adequate for Discharge 06/09/2024 1701 by Florian Dellis POUR, RN Outcome: Adequate for Discharge 06/09/2024 1523 by Florian Dellis POUR, RN Outcome: Progressing Goal: Progress toward achieving an optimal weight will improve 06/09/2024 1702 by Topacio Cella K, RN Outcome: Adequate for Discharge 06/09/2024 1701 by Florian Dellis POUR, RN Outcome: Adequate for Discharge 06/09/2024 1523 by Florian Dellis POUR, RN Outcome: Progressing   Problem: Skin Integrity: Goal: Risk for impaired skin integrity will decrease 06/09/2024 1702 by Xavius Spadafore K, RN Outcome: Adequate for Discharge 06/09/2024 1701 by Jmya Uliano K, RN Outcome: Adequate for Discharge 06/09/2024 1523 by Florian Dellis POUR, RN Outcome: Progressing   Problem: Tissue Perfusion: Goal: Adequacy of tissue perfusion will improve 06/09/2024 1702 by Emberlynn Riggan K, RN Outcome: Adequate for Discharge 06/09/2024 1701 by Ashlon Lottman K, RN Outcome: Adequate for Discharge 06/09/2024 1523 by Tra Wilemon K, RN Outcome: Progressing   Problem: Education: Goal: Knowledge of General Education information will improve Description: Including pain rating scale, medication(s)/side effects and non-pharmacologic comfort measures 06/09/2024 1702 by Mafalda Mcginniss K, RN Outcome: Adequate for Discharge 06/09/2024 1701 by Itzelle Gains K, RN Outcome: Adequate for Discharge 06/09/2024 1523 by Gianah Batt K, RN Outcome: Progressing   Problem: Health Behavior/Discharge Planning: Goal: Ability to manage health-related needs will improve 06/09/2024 1702 by Rosaleah Person K, RN Outcome: Adequate for Discharge 06/09/2024 1701 by Richele Strand K,  RN Outcome: Adequate for Discharge 06/09/2024 1523 by Florian Dellis POUR, RN Outcome: Progressing   Problem: Clinical Measurements: Goal: Ability to maintain clinical measurements within normal limits will improve 06/09/2024 1702  by Danaka Llera K, RN Outcome: Adequate for Discharge 06/09/2024 1701 by Cadel Stairs K, RN Outcome: Adequate for Discharge 06/09/2024 1523 by Nadalie Laughner K, RN Outcome: Progressing Goal: Will remain free from infection 06/09/2024 1702 by Yeilyn Gent K, RN Outcome: Adequate for Discharge 06/09/2024 1701 by Allen Egerton K, RN Outcome: Adequate for Discharge 06/09/2024 1523 by Aslynn Brunetti K, RN Outcome: Progressing Goal: Diagnostic test results will improve 06/09/2024 1702 by Neira Bentsen K, RN Outcome: Adequate for Discharge 06/09/2024 1701 by Harlei Lehrmann K, RN Outcome: Adequate for Discharge 06/09/2024 1523 by Sarkis Rhines K, RN Outcome: Progressing Goal: Respiratory complications will improve 06/09/2024 1702 by Nael Petrosyan K, RN Outcome: Adequate for Discharge 06/09/2024 1701 by Mariadelaluz Guggenheim K, RN Outcome: Adequate for Discharge 06/09/2024 1523 by Rishard Delange K, RN Outcome: Progressing Goal: Cardiovascular complication will be avoided 06/09/2024 1702 by Syrah Daughtrey K, RN Outcome: Adequate for Discharge 06/09/2024 1701 by Thorn Demas K, RN Outcome: Adequate for Discharge 06/09/2024 1523 by Manessa Buley K, RN Outcome: Progressing   Problem: Activity: Goal: Risk for activity intolerance will decrease 06/09/2024 1702 by Rhyker Silversmith K, RN Outcome: Adequate for Discharge 06/09/2024 1701 by Florian Dellis POUR, RN Outcome: Adequate for Discharge 06/09/2024 1523 by Florian Dellis POUR, RN Outcome: Progressing   Problem: Nutrition: Goal: Adequate nutrition will be maintained 06/09/2024 1702 by Miyah Hampshire K, RN Outcome: Adequate for Discharge 06/09/2024 1701 by Florian Dellis POUR, RN Outcome: Adequate for Discharge 06/09/2024 1523  by Florian Dellis POUR, RN Outcome: Progressing   Problem: Coping: Goal: Level of anxiety will decrease 06/09/2024 1702 by Tilley Faeth K, RN Outcome: Adequate for Discharge 06/09/2024 1701 by Florian Dellis POUR, RN Outcome: Adequate for Discharge 06/09/2024 1523 by Florian Dellis POUR, RN Outcome: Progressing   Problem: Elimination: Goal: Will not experience complications related to bowel motility 06/09/2024 1702 by Natallie Ravenscroft K, RN Outcome: Adequate for Discharge 06/09/2024 1701 by Florian Dellis POUR, RN Outcome: Adequate for Discharge 06/09/2024 1523 by Florian Dellis POUR, RN Outcome: Progressing Goal: Will not experience complications related to urinary retention 06/09/2024 1702 by Yaniyah Koors K, RN Outcome: Adequate for Discharge 06/09/2024 1701 by Florian Dellis POUR, RN Outcome: Adequate for Discharge 06/09/2024 1523 by Florian Dellis POUR, RN Outcome: Progressing   Problem: Pain Managment: Goal: General experience of comfort will improve and/or be controlled 06/09/2024 1702 by Keneshia Tena K, RN Outcome: Adequate for Discharge 06/09/2024 1701 by Harkirat Orozco K, RN Outcome: Adequate for Discharge 06/09/2024 1523 by Evans Levee K, RN Outcome: Progressing   Problem: Safety: Goal: Ability to remain free from injury will improve 06/09/2024 1702 by Jack Mineau K, RN Outcome: Adequate for Discharge 06/09/2024 1701 by Jontavius Rabalais K, RN Outcome: Adequate for Discharge 06/09/2024 1523 by Florian Dellis POUR, RN Outcome: Progressing   Problem: Skin Integrity: Goal: Risk for impaired skin integrity will decrease 06/09/2024 1702 by Marnell Mcdaniel K, RN Outcome: Adequate for Discharge 06/09/2024 1701 by Press Casale K, RN Outcome: Adequate for Discharge 06/09/2024 1523 by Bernhardt Riemenschneider K, RN Outcome: Progressing

## 2024-06-11 ENCOUNTER — Inpatient Hospital Stay (HOSPITAL_COMMUNITY)

## 2024-06-11 ENCOUNTER — Ambulatory Visit: Admitting: Vascular Surgery

## 2024-06-18 ENCOUNTER — Inpatient Hospital Stay: Admitting: Internal Medicine

## 2024-06-22 ENCOUNTER — Encounter: Payer: Self-pay | Admitting: Orthopedic Surgery

## 2024-06-22 ENCOUNTER — Ambulatory Visit (INDEPENDENT_AMBULATORY_CARE_PROVIDER_SITE_OTHER): Admitting: Orthopedic Surgery

## 2024-06-22 DIAGNOSIS — S88111A Complete traumatic amputation at level between knee and ankle, right lower leg, initial encounter: Secondary | ICD-10-CM

## 2024-06-22 DIAGNOSIS — T8781 Dehiscence of amputation stump: Secondary | ICD-10-CM

## 2024-06-22 NOTE — Progress Notes (Signed)
 Office Visit Note   Patient: [name]           Date of Birth: 1956/07/23           MRN: [id number] Visit Date: 06/22/2024              Requested by: Campbell Reynolds, NP [address] [place],  [place] [address] PCP: Campbell Reynolds, NP  Chief Complaint  Patient presents with   Right Leg - Routine Post Op    04/20/2024 right BKA      HPI: Discussed the use of AI scribe software for clinical note transcription with the patient, who gave verbal consent to proceed.  History of Present Illness [name] is a 68 year old female with diabetes who presents with a non-healing stump wound.  She has a non-healing stump wound following a previous amputation, with dehiscence directly over the tibia and exposed bone. No drainage from the wound is noted. She is concerned about the slow healing process, which she attributes to her diabetes.  She is worried about the possibility of further amputation.  She undergoes dialysis on Mondays, Wednesdays, and Fridays, which is a consideration in planning her care.     Assessment & Plan: Visit Diagnoses:  1. Dehiscence of amputation stump of right lower extremity (HCC)   2. Below-knee amputation of right lower extremity, initial encounter [facility])     Plan: Assessment and Plan Assessment & Plan Dehiscence of below-knee amputation stump with exposed tibia Surgical wound dehiscence with exposed tibia, no cellulitis, odor, or drainage. Diabetes contributing to delayed healing. Surgical intervention required. - Schedule surgery Wednesday to trim half an inch of exposed bone and cover with muscle. - Arrange dialysis at [facility] post-surgery. - Plan discharge Friday, extend if needed for dialysis. - Ensure Maureen facilitates discharge. - Provide smaller shrink wrap for stump.      Follow-Up Instructions: Return in about 2 weeks (around 07/06/2024).   Ortho Exam  Patient is alert, oriented, no adenopathy, well-dressed, normal  affect, normal respiratory effort. Physical Exam EXTREMITIES: Dehiscence of surgical wound over tibia with exposed tibia. No cellulitis, odor, or drainage.      Imaging: No results found.   Labs: Lab Results  Component Value Date   HGBA1C 5.1 05/29/2024   HGBA1C 6.6 (H) 10/28/2023   HGBA1C 6.4 02/28/2021   ESRSEDRATE 17 05/29/2024   ESRSEDRATE 83 (H) 07/25/2011   CRP 4.1 (H) 05/29/2024   CRP 21.9 (H) 04/19/2024   CRP 12.1 (H) 11/10/2023   REPTSTATUS 06/05/2024 FINAL 05/29/2024   GRAMSTAIN  05/29/2024    RARE WBC PRESENT, PREDOMINANTLY PMN NO ORGANISMS SEEN    CULT  05/29/2024    FEW PROTEUS MIRABILIS FEW ENTEROCOCCUS FAECIUM VANCOMYCIN  RESISTANT ENTEROCOCCUS ISOLATED NO ANAEROBES ISOLATED Performed at [facility], [address], [place], [place] [address]    IDOLINA DECANT MIRABILIS 05/29/2024   LABORGA ENTEROCOCCUS FAECIUM 05/29/2024     Lab Results  Component Value Date   ALBUMIN  1.8 (L) 06/08/2024   ALBUMIN  1.7 (L) 06/06/2024   ALBUMIN  1.8 (L) 06/03/2024    Lab Results  Component Value Date   MG 1.9 05/29/2024   MG 1.8 04/25/2024   MG 2.0 04/21/2024   Lab Results  Component Value Date   VD25OH 26 (L) 04/09/2013   VD25OH 17 (L) 01/13/2013    No results found for: PREALBUMIN    Latest Ref Rng & Units 06/08/2024    3:03 AM 06/05/2024   12:59 PM  06/04/2024    4:00 AM  CBC EXTENDED  WBC 4.0 - 10.5 K/uL 10.9  11.8  11.4   RBC 3.87 - 5.11 MIL/uL 3.77  3.72  3.24   Hemoglobin 12.0 - 15.0 g/dL 9.8  9.6  8.3   HCT 63.9 - 46.0 % 32.0  32.5  27.4   Platelets 150 - 400 K/uL 502  420  319   NEUT# 1.7 - 7.7 K/uL  9.8    Lymph# 0.7 - 4.0 K/uL  1.2       There is no height or weight on file to calculate BMI.  Orders:  No orders of the defined types were placed in this encounter.  No orders of the defined types were placed in this encounter.    Procedures: No procedures performed  Clinical Data: No additional findings.  ROS:  All  other systems negative, except as noted in the HPI. Review of Systems  Objective: Vital Signs: There were no vitals taken for this visit.  Specialty Comments:  No specialty comments available.  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) 05/30/2024   Hypoalbuminemia due to protein-calorie malnutrition 05/30/2024   Osteomyelitis of vertebra, sacral and sacrococcygeal region (HCC) 05/29/2024   Primary hypercoagulable state 05/29/2024   BKA stump complication (HCC) 05/29/2024   Normocytic anemia 05/29/2024   Does not feel safe at home 05/29/2024   Colostomy in place [facility]) 05/29/2024   Diabetes mellitus with kidney complication, with long-term current use of insulin  (HCC) 05/29/2024   Osteomyelitis (HCC) 04/20/2024   MRSA bacteremia 04/20/2024   Bacteremia 04/20/2024   Necrotizing fasciitis (HCC) 04/20/2024   ESRD (end stage renal disease) (HCC) 04/20/2024   Irritant contact dermatitis associated with fecal stoma 12/27/2023   Colostomy complication, unspecified (HCC) 12/27/2023   Diverticulitis of large intestine with complication 10/27/2023   Foot callus 02/27/2021   Counseled about COVID-19 virus infection 02/24/2020   Aortic heart murmur 05/15/2019   Thyroid  enlargement 07/19/2014   Vitamin D  deficiency 09/03/2013   Hyperlipidemia 04/24/2013   Diabetic retinopathy (HCC) 01/23/2013   Chronic atrial fibrillation (HCC) 01/15/2013   Aortic atherosclerosis 01/11/2013   H/O non-ST elevation myocardial infarction (NSTEMI) 01/09/2013   History of stroke 01/08/2013   Diabetic neuropathy 01/08/2013   Hypertension    Diabetes mellitus    Past Medical History:  Diagnosis Date   Anemia    Arthritis    Atrial fibrillation (HCC)    CAD (coronary artery disease)    Chronic kidney disease, stage III (moderate) (HCC)    Constipation     Diabetes mellitus (HCC)    ESRD on dialysis (HCC) 04/20/2024   History of blood transfusion    HLD (hyperlipidemia)    Hypertension    Myocardial infarction (HCC)    in April 2014   Septic shock (HCC) 04/19/2024   Wound infection after surgery 03/17/2013    Family History  Problem Relation Age of Onset   Diabetes Mother        No history CAD   Colon cancer Mother 50       died at 17 from CRC   Hypertension Father        Also had CAD   Heart disease Father    Cervical cancer Sister    Diabetes Sister    Congestive Heart Failure Sister    Heart disease Maternal  Grandmother    Diabetes Brother    Kidney disease Brother    Sickle cell anemia Son    Thyroid  disease Son    Breast cancer Neg Hx    BRCA 1/2 Neg Hx     Past Surgical History:  Procedure Laterality Date   AMPUTATION Right 04/20/2024   Procedure: AMPUTATION BELOW KNEE;  Surgeon: Harden Jerona GAILS, MD;  Location: [facility] OR;  Service: Orthopedics;  Laterality: Right;   ANKLE SURGERY Right    x 6 total   BREAST BIOPSY Left    15 or 20 yearsa ago she cant remember laterality    HARDWARE REMOVAL Right 02/13/2013   Procedure: HARDWARE REMOVAL;  Surgeon: Jerona GAILS Harden, MD;  Location: [facility] OR;  Service: Orthopedics;  Laterality: Right;  Right Ankle Removal Hardware, Debridement, Place Wound VAC   IR FLUORO GUIDE CV LINE LEFT  11/08/2023   IR FLUORO GUIDE CV LINE LEFT  04/23/2024   IR FLUORO GUIDE CV LINE RIGHT  11/01/2023   IR FLUORO GUIDE CV LINE RIGHT  11/12/2023   IR REMOVAL TUN CV CATH W/O FL  04/21/2024   IR US  GUIDE VASC ACCESS LEFT  11/08/2023   IR US  GUIDE VASC ACCESS LEFT  04/23/2024   IR US  GUIDE VASC ACCESS RIGHT  11/01/2023   IR US  GUIDE VASC ACCESS RIGHT  11/12/2023   laser surgery for diabetic retinopathy Bilateral    LEFT HEART CATHETERIZATION WITH CORONARY ANGIOGRAM N/A 01/09/2013   Procedure: LEFT HEART CATHETERIZATION WITH CORONARY ANGIOGRAM;  Surgeon: Toribio JONELLE Fuel, MD;  Location: [facility] CATH LAB;  Service:  Cardiovascular;  Laterality: N/A;   ORIF ANKLE FRACTURE Right 01/12/2013   Procedure: OPEN REDUCTION INTERNAL FIXATION (ORIF) ANKLE FRACTURE;  Surgeon: Jerona GAILS Harden, MD;  Location: [facility] OR;  Service: Orthopedics;  Laterality: Right;   PARTIAL COLECTOMY N/A 11/02/2023   Procedure: OPEN PARTIAL COLECTOMY WITH END COLOSTOMY;  Surgeon: Dasie Leonor CROME, MD;  Location: [facility] OR;  Service: General;  Laterality: N/A;   PARTIAL HYSTERECTOMY     TUNNELLED CATHETER EXCHANGE N/A 04/15/2024   Procedure: TUNNELLED CATHETER EXCHANGE;  Surgeon: Pearline Norman RAMAN, MD;  Location: [facility] LAB;  Service: Cardiovascular;  Laterality: N/A;   Social History   Occupational History   Occupation: Disabled   Occupation: disabled  Tobacco Use   Smoking status: Never   Smokeless tobacco: Never  Vaping Use   Vaping status: Never Used  Substance and Sexual Activity   Alcohol  use: No   Drug use: No   Sexual activity: Not Currently

## 2024-06-23 ENCOUNTER — Encounter (HOSPITAL_COMMUNITY): Payer: Self-pay | Admitting: Orthopedic Surgery

## 2024-06-23 ENCOUNTER — Other Ambulatory Visit: Payer: Self-pay

## 2024-06-23 NOTE — Progress Notes (Signed)
 Patient was called to be informed that the surgery time for tomorrow was changed. Patient wasn't available and this writer left a message. Patient was instructed to be at the hospital at 08:30 o'clock, and to stop drinking clear liquids at 08:00 o'clock. Also, this writer left a message on the patient's phone with the same instructions.

## 2024-06-23 NOTE — Progress Notes (Signed)
 Surgical Instructions                 Your procedure is scheduled on Wednesday October 1.             Report to [facility] Main Entrance A at 10:50 A.M., then check in with the Admitting office.             Call this number if you have problems the morning of surgery:             [phone]                 Remember:             Do not eat after midnight the night before your surgery   You may drink clear liquids until 10:15am the morning of your surgery.   Clear liquids allowed are: Water, Non-Citrus Juices (without pulp), Carbonated Beverages, Clear Tea, Black Coffee ONLY (NO MILK, CREAM OR POWDERED CREAMER of any kind), and Gatorade               Take these medicines the morning of surgery with A SIP OF WATER:  brimonidine  (ALPHAGAN ) 0.2 % ophthalmic solution  linezolid  (ZYVOX )  metoprolol  tartrate (LOPRESSOR )  acetaminophen  (TYLENOL ) if needed albuterol  (PROVENTIL ) (2.5 MG/3ML) 0.083% nebulizer if needed midodrine  (PROAMATINE ) if needed HYDROmorphone  (DILAUDID )    As of today, STOP taking any Aspirin  (unless otherwise instructed by your surgeon) Aleve, Naproxen, Ibuprofen, Motrin, Advil, Goody's, BC's, all herbal medications, fish oil, and all vitamins.   [facility] is not responsible for any belongings or valuables. .    Do NOT Smoke (Tobacco/Vaping)  24 hours prior to your procedure   If you use a CPAP at night, you may bring your mask for your overnight stay.   Contacts, glasses, hearing aids, dentures or partials may not be worn into surgery, please bring cases for these belongings   Patients discharged the day of surgery will not be allowed to drive home, and someone needs to stay with them for 24 hours.     SURGICAL WAITING ROOM VISITATION You may have 2 visitors in the pre-op  area at a time determined by the pre-op  nurse. (Visitor may not switch out) Patients having surgery or a procedure in a hospital may have two support people in the waiting  room. Children under the age of 26 must have an adult with them who is not the patient. They may stay in the waiting area during the procedure and may switch out with other visitors. If the patient needs to stay at the hospital during part of their recovery, the visitor guidelines for inpatient rooms apply.   Please refer to the [facility] website for the visitor guidelines for Inpatients (after your surgery is over and you are in a regular room).      Special instructions:     Oral Hygiene is also important to reduce your risk of infection.  Remember - BRUSH YOUR TEETH THE MORNING OF SURGERY WITH YOUR REGULAR TOOTHPASTE     Day of Surgery:   Take a shower the day of or night before with antibacterial soap. Wear Clean/Comfortable clothing the morning of surgery Do not apply any deodorants/lotions.   Do not wear jewelry or makeup Do not wear lotions, powders, perfumes/colognes, or deodorant. Do not shave 48 hours prior to surgery.  Men may shave face and neck. Do not bring valuables to the hospital. Do not wear nail polish, gel polish,  artificial nails, or any other type of covering on natural nails (fingers and toes) If you have artificial nails or gel coating that need to be removed by a nail salon, please have this removed prior to surgery. Artificial nails or gel coating may interfere with anesthesia's ability to adequately monitor your vital signs. Remember to brush your teeth WITH YOUR REGULAR TOOTHPASTE.     Day of surgery, please send a copy of the patient's medication record indicating the date and time each medication was last given. Also, please send extra colostomy supplies with patient in case they are needed. Thank you.

## 2024-06-23 NOTE — H&P (Signed)
 [name] is an 68 y.o. female.   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 She has history of diabetes who presents with a non-healing stump wound.   She has a non-healing stump wound following a previous amputation, with dehiscence directly over the tibia and exposed bone.  She is on HD MWF so nephrology will need to be informed post op.    Past Medical History:  Diagnosis Date   Anemia    Arthritis    Atrial fibrillation (HCC)    CAD (coronary artery disease)    Chronic kidney disease, stage III (moderate) (HCC)    Constipation    Diabetes mellitus (HCC)    ESRD on dialysis (HCC) 04/20/2024   History of blood transfusion    HLD (hyperlipidemia)    Hypertension    Myocardial infarction [facility])    in April 2014   Septic shock (HCC) 04/19/2024   Wound infection after surgery 03/17/2013    Past Surgical History:  Procedure Laterality Date   AMPUTATION Right 04/20/2024   Procedure: AMPUTATION BELOW KNEE;  Surgeon: Harden Jerona GAILS, MD;  Location: [facility] OR;  Service: Orthopedics;  Laterality: Right;   ANKLE SURGERY Right    x 6 total   BREAST BIOPSY Left    15 or 20 yearsa ago she cant remember laterality    HARDWARE REMOVAL Right 02/13/2013   Procedure: HARDWARE REMOVAL;  Surgeon: Jerona GAILS Harden, MD;  Location: [facility] OR;  Service: Orthopedics;  Laterality: Right;  Right Ankle Removal Hardware, Debridement, Place Wound VAC   IR FLUORO GUIDE CV LINE LEFT  11/08/2023   IR FLUORO GUIDE CV LINE LEFT  04/23/2024   IR FLUORO GUIDE CV LINE RIGHT  11/01/2023   IR FLUORO GUIDE CV LINE RIGHT  11/12/2023   IR REMOVAL TUN CV CATH W/O FL  04/21/2024   IR US  GUIDE VASC ACCESS LEFT  11/08/2023   IR US  GUIDE VASC ACCESS LEFT  04/23/2024   IR US  GUIDE VASC ACCESS RIGHT  11/01/2023   IR US   GUIDE VASC ACCESS RIGHT  11/12/2023   laser surgery for diabetic retinopathy Bilateral    LEFT HEART CATHETERIZATION WITH CORONARY ANGIOGRAM N/A 01/09/2013   Procedure: LEFT HEART CATHETERIZATION WITH CORONARY ANGIOGRAM;  Surgeon: Toribio JONELLE Fuel, MD;  Location: [facility] CATH LAB;  Service: Cardiovascular;  Laterality: N/A;   ORIF ANKLE FRACTURE Right 01/12/2013   Procedure: OPEN REDUCTION INTERNAL FIXATION (ORIF) ANKLE FRACTURE;  Surgeon: Jerona GAILS Harden, MD;  Location: [facility] OR;  Service: Orthopedics;  Laterality: Right;   PARTIAL COLECTOMY N/A 11/02/2023   Procedure: OPEN PARTIAL COLECTOMY WITH END COLOSTOMY;  Surgeon: Dasie Leonor LITTIE, MD;  Location: [facility] OR;  Service: General;  Laterality: N/A;   PARTIAL HYSTERECTOMY     TUNNELLED CATHETER EXCHANGE N/A 04/15/2024   Procedure: TUNNELLED CATHETER EXCHANGE;  Surgeon: Pearline Norman RAMAN, MD;  Location: [facility] LAB;  Service: Cardiovascular;  Laterality: N/A;    Family History  Problem Relation Age of Onset   Diabetes Mother        No history CAD   Colon cancer Mother 81       died at 13 from CRC   Hypertension Father        Also had CAD   Heart disease  Father    Cervical cancer Sister    Diabetes Sister    Congestive Heart Failure Sister    Heart disease Maternal Grandmother    Diabetes Brother    Kidney disease Brother    Sickle cell anemia Son    Thyroid  disease Son    Breast cancer Neg Hx    BRCA 1/2 Neg Hx    Social History:  reports that she has never smoked. She has never used smokeless tobacco. She reports that she does not drink alcohol  and does not use drugs.  Allergies:  Allergies  Allergen Reactions   Nsaids Other (See Comments)    CKD stage 4   Lisinopril Other (See Comments)    coughing   Peanut-Containing Drug Products Itching and Other (See Comments)    GI intolerance - diarrhea    No medications prior to admission.    No results found for this or any previous visit (from the past 48 hours). No results found.  Review  of Systems  All other systems reviewed and are negative.   There were no vitals taken for this visit. Physical Exam  Dehiscence of surgical wound over tibia with exposed tibia. No cellulitis, odor, or drainage.  Lungs non labored breathing Heart RRR General no acute distress   Assessment/Plan Visit Diagnoses:  1. Dehiscence of amputation stump of right lower extremity (HCC)   2. Below-knee amputation of right lower extremity, initial encounter [facility])       Plan: Assessment and Plan Assessment & Plan Dehiscence of below-knee amputation stump with exposed tibia Surgical wound dehiscence with exposed tibia, no cellulitis, odor, or drainage. Diabetes contributing to delayed healing. Surgical intervention required. - Schedule surgery Wednesday to trim half an inch of exposed bone and cover with muscle. - Arrange dialysis at [facility] post-surgery. - Plan discharge Friday, extend if needed for dialysis. - Ensure Maureen facilitates discharge. - Provide smaller shrink wrap for stump.      Maurilio Deland Collet, PA-C 06/23/2024, 4:53 PM

## 2024-06-23 NOTE — Progress Notes (Signed)
 SDW CALL  Patient was given pre-op  instructions over the phone. The opportunity was given for the patient to ask questions. No further questions asked. Patient verbalized understanding of instructions given.   PCP -  Cardiologist -   PPM/ICD -  Device Orders -  Rep Notified -   Chest x-ray -  EKG -  Stress Test -  ECHO -  Cardiac Cath -   Sleep Study -  CPAP -   Fasting Blood Sugar -  Checks Blood Sugar _____ times a day  Blood Thinner Instructions: Aspirin  Instructions:  ERAS Protcol - PRE-SURGERY Ensure or G2-   COVID TEST-    Anesthesia review:   Patient denies shortness of breath, fever, cough and chest pain over the phone call    Surgical Instructions    Your procedure is scheduled on Wednesday October 1.  Report to [facility] Main Entrance A at 10:50 A.M., then check in with the Admitting office.  Call this number if you have problems the morning of surgery:  [phone]    Remember:  Do not eat after midnight the night before your surgery  You may drink clear liquids until 10:15am the morning of your surgery.   Clear liquids allowed are: Water, Non-Citrus Juices (without pulp), Carbonated Beverages, Clear Tea, Black Coffee ONLY (NO MILK, CREAM OR POWDERED CREAMER of any kind), and Gatorade   Take these medicines the morning of surgery with A SIP OF WATER:  brimonidine  (ALPHAGAN ) 0.2 % ophthalmic solution  linezolid  (ZYVOX )  metoprolol  tartrate (LOPRESSOR )  acetaminophen  (TYLENOL ) if needed albuterol  (PROVENTIL ) (2.5 MG/3ML) 0.083% nebulizer if needed midodrine  (PROAMATINE ) if needed HYDROmorphone  (DILAUDID )   As of today, STOP taking any Aspirin  (unless otherwise instructed by your surgeon) Aleve, Naproxen, Ibuprofen, Motrin, Advil, Goody's, BC's, all herbal medications, fish oil, and all vitamins.  [facility] is not responsible for any belongings or valuables. .   Do NOT Smoke (Tobacco/Vaping)  24 hours prior to your procedure  If you  use a CPAP at night, you may bring your mask for your overnight stay.   Contacts, glasses, hearing aids, dentures or partials may not be worn into surgery, please bring cases for these belongings   Patients discharged the day of surgery will not be allowed to drive home, and someone needs to stay with them for 24 hours.   SURGICAL WAITING ROOM VISITATION You may have 2 visitors in the pre-op  area at a time determined by the pre-op  nurse. (Visitor may not switch out) Patients having surgery or a procedure in a hospital may have two support people in the waiting room. Children under the age of 67 must have an adult with them who is not the patient. They may stay in the waiting area during the procedure and may switch out with other visitors. If the patient needs to stay at the hospital during part of their recovery, the visitor guidelines for inpatient rooms apply.  Please refer to the [facility] website for the visitor guidelines for Inpatients (after your surgery is over and you are in a regular room).    Special instructions:    Oral Hygiene is also important to reduce your risk of infection.  Remember - BRUSH YOUR TEETH THE MORNING OF SURGERY WITH YOUR REGULAR TOOTHPASTE   Day of Surgery:  Take a shower the day of or night before with antibacterial soap. Wear Clean/Comfortable clothing the morning of surgery Do not apply any deodorants/lotions.   Do not wear jewelry or makeup Do not  wear lotions, powders, perfumes/colognes, or deodorant. Do not shave 48 hours prior to surgery.  Men may shave face and neck. Do not bring valuables to the hospital. Do not wear nail polish, gel polish, artificial nails, or any other type of covering on natural nails (fingers and toes) If you have artificial nails or gel coating that need to be removed by a nail salon, please have this removed prior to surgery. Artificial nails or gel coating may interfere with anesthesia's ability to adequately monitor  your vital signs. Remember to brush your teeth WITH YOUR REGULAR TOOTHPASTE.   Day of surgery, please send a copy of the patient's medication record indicating the date and time each medication was last given. Also, please send extra colostomy supplies with patient in case they are needed. Thank you.

## 2024-06-23 NOTE — Progress Notes (Addendum)
 Spoke with Nyeesha from [facility] who informed me that [name] was discharged from the facility on 06/19/24. She did not know to  where the patient was discharged. I will call DPR.

## 2024-06-23 NOTE — Progress Notes (Signed)
 SDW CALL  Patient was given pre-op  instructions over the phone. The opportunity was given for the patient to ask questions. No further questions asked. Patient verbalized understanding of instructions given.   PCP - Campbell Reynolds, NP Cardiologist - Loni Soyla LABOR, MD  PPM/ICD - denies Device Orders -  Rep Notified -   Chest x-ray - 04/19/24 EKG - 04/24/24 Stress Test - 05/16/23 ECHO - 04/20/24 Cardiac Cath - 01/10/23  Sleep Study - denies CPAP - no  Fasting Blood Sugar - pt states her meter is broken and she has no means of checking her blood sugar at home. Pt states she is taking Novolog  and Lantus  insulin  at home. Instructed her to hold Novolog  DOS and only take 1/2 her usual dose of Lantus  the morning of surgery. She stated she would take 5 units of Lantus  DOS. She states she is symptomatic if her blood sugar is low and will take glucose tablet if she feels her sugar is low the morning of surgery.  Checks Blood Sugar _____ times a day  Blood Thinner Instructions: follow Dr. Crist instructions for Eliquis .  Aspirin  Instructions:na  ERAS Protcol - clears until 1015 PRE-SURGERY Ensure or G2- no  COVID TEST- na   Anesthesia review: yes-hospitalized 9/5-9/16 osteomyelitis vetebra sacral region;DM,HTN,CAD,Afib on Eliquis   Patient denies shortness of breath, fever, cough and chest pain over the phone call  Special instructions:    Oral Hygiene is also important to reduce your risk of infection.  Remember - BRUSH YOUR TEETH THE MORNING OF SURGERY WITH YOUR REGULAR TOOTHPASTE

## 2024-06-24 ENCOUNTER — Inpatient Hospital Stay (HOSPITAL_COMMUNITY)
Admission: RE | Admit: 2024-06-24 | Discharge: 2024-06-30 | DRG: 463 | Disposition: A | Discharge status: Home / Self Care | Attending: Orthopedic Surgery | Admitting: Orthopedic Surgery

## 2024-06-24 ENCOUNTER — Inpatient Hospital Stay (HOSPITAL_COMMUNITY): Payer: Self-pay | Admitting: Physician Assistant

## 2024-06-24 ENCOUNTER — Other Ambulatory Visit: Payer: Self-pay

## 2024-06-24 ENCOUNTER — Encounter (HOSPITAL_COMMUNITY)
Admission: RE | Disposition: A | Discharge status: Home / Self Care | Payer: Self-pay | Source: Home / Self Care | Attending: Orthopedic Surgery

## 2024-06-24 DIAGNOSIS — Z8249 Family history of ischemic heart disease and other diseases of the circulatory system: Secondary | ICD-10-CM | POA: Diagnosis not present

## 2024-06-24 DIAGNOSIS — Y835 Amputation of limb(s) as the cause of abnormal reaction of the patient, or of later complication, without mention of misadventure at the time of the procedure: Secondary | ICD-10-CM | POA: Diagnosis present

## 2024-06-24 DIAGNOSIS — I252 Old myocardial infarction: Secondary | ICD-10-CM | POA: Diagnosis not present

## 2024-06-24 DIAGNOSIS — D6959 Other secondary thrombocytopenia: Secondary | ICD-10-CM | POA: Diagnosis not present

## 2024-06-24 DIAGNOSIS — Z7901 Long term (current) use of anticoagulants: Secondary | ICD-10-CM | POA: Diagnosis not present

## 2024-06-24 DIAGNOSIS — Z886 Allergy status to analgesic agent status: Secondary | ICD-10-CM

## 2024-06-24 DIAGNOSIS — T879 Unspecified complications of amputation stump: Principal | ICD-10-CM

## 2024-06-24 DIAGNOSIS — Z833 Family history of diabetes mellitus: Secondary | ICD-10-CM

## 2024-06-24 DIAGNOSIS — N186 End stage renal disease: Secondary | ICD-10-CM | POA: Diagnosis present

## 2024-06-24 DIAGNOSIS — T368X5A Adverse effect of other systemic antibiotics, initial encounter: Secondary | ICD-10-CM | POA: Diagnosis not present

## 2024-06-24 DIAGNOSIS — Z79899 Other long term (current) drug therapy: Secondary | ICD-10-CM

## 2024-06-24 DIAGNOSIS — I2510 Atherosclerotic heart disease of native coronary artery without angina pectoris: Secondary | ICD-10-CM

## 2024-06-24 DIAGNOSIS — Z8614 Personal history of Methicillin resistant Staphylococcus aureus infection: Secondary | ICD-10-CM

## 2024-06-24 DIAGNOSIS — M4628 Osteomyelitis of vertebra, sacral and sacrococcygeal region: Secondary | ICD-10-CM | POA: Diagnosis not present

## 2024-06-24 DIAGNOSIS — T8781 Dehiscence of amputation stump: Principal | ICD-10-CM | POA: Diagnosis present

## 2024-06-24 DIAGNOSIS — Z794 Long term (current) use of insulin: Secondary | ICD-10-CM | POA: Diagnosis not present

## 2024-06-24 DIAGNOSIS — Z992 Dependence on renal dialysis: Secondary | ICD-10-CM | POA: Diagnosis not present

## 2024-06-24 DIAGNOSIS — Z832 Family history of diseases of the blood and blood-forming organs and certain disorders involving the immune mechanism: Secondary | ICD-10-CM

## 2024-06-24 DIAGNOSIS — D696 Thrombocytopenia, unspecified: Secondary | ICD-10-CM | POA: Diagnosis not present

## 2024-06-24 DIAGNOSIS — I120 Hypertensive chronic kidney disease with stage 5 chronic kidney disease or end stage renal disease: Secondary | ICD-10-CM | POA: Diagnosis present

## 2024-06-24 DIAGNOSIS — L89159 Pressure ulcer of sacral region, unspecified stage: Secondary | ICD-10-CM | POA: Diagnosis not present

## 2024-06-24 DIAGNOSIS — E785 Hyperlipidemia, unspecified: Secondary | ICD-10-CM | POA: Diagnosis present

## 2024-06-24 DIAGNOSIS — Z888 Allergy status to other drugs, medicaments and biological substances status: Secondary | ICD-10-CM | POA: Diagnosis not present

## 2024-06-24 DIAGNOSIS — D631 Anemia in chronic kidney disease: Secondary | ICD-10-CM | POA: Diagnosis present

## 2024-06-24 DIAGNOSIS — Z89511 Acquired absence of right leg below knee: Secondary | ICD-10-CM | POA: Diagnosis not present

## 2024-06-24 DIAGNOSIS — T8789 Other complications of amputation stump: Secondary | ICD-10-CM | POA: Diagnosis not present

## 2024-06-24 DIAGNOSIS — Z8419 Family history of other disorders of kidney and ureter: Secondary | ICD-10-CM | POA: Diagnosis not present

## 2024-06-24 DIAGNOSIS — L89154 Pressure ulcer of sacral region, stage 4: Secondary | ICD-10-CM | POA: Diagnosis present

## 2024-06-24 DIAGNOSIS — Z22359 Carrier of enterobacterales, unspecified: Secondary | ICD-10-CM | POA: Diagnosis not present

## 2024-06-24 DIAGNOSIS — M898X9 Other specified disorders of bone, unspecified site: Secondary | ICD-10-CM | POA: Diagnosis present

## 2024-06-24 DIAGNOSIS — E1122 Type 2 diabetes mellitus with diabetic chronic kidney disease: Secondary | ICD-10-CM | POA: Diagnosis present

## 2024-06-24 DIAGNOSIS — Z91010 Allergy to peanuts: Secondary | ICD-10-CM | POA: Diagnosis not present

## 2024-06-24 HISTORY — PX: REVISION AMPUTATION, BELOW THE KNEE: SHX7423

## 2024-06-24 LAB — COMPREHENSIVE METABOLIC PANEL WITH GFR
ALT: 12 U/L (ref 0–44)
AST: 15 U/L (ref 15–41)
Albumin: 2.3 g/dL — ABNORMAL LOW (ref 3.5–5.0)
Alkaline Phosphatase: 64 U/L (ref 38–126)
Anion gap: 12 (ref 5–15)
BUN: 32 mg/dL — ABNORMAL HIGH (ref 8–23)
CO2: 19 mmol/L — ABNORMAL LOW (ref 22–32)
Calcium: 8.7 mg/dL — ABNORMAL LOW (ref 8.9–10.3)
Chloride: 103 mmol/L (ref 98–111)
Creatinine, Ser: 6.23 mg/dL — ABNORMAL HIGH (ref 0.44–1.00)
GFR, Estimated: 7 mL/min — ABNORMAL LOW (ref >60)
Glucose, Bld: 122 mg/dL — ABNORMAL HIGH (ref 70–99)
Potassium: 4.1 mmol/L (ref 3.5–5.1)
Sodium: 134 mmol/L — ABNORMAL LOW (ref 135–145)
Total Bilirubin: 0.6 mg/dL (ref 0.0–1.2)
Total Protein: 6 g/dL — ABNORMAL LOW (ref 6.5–8.1)

## 2024-06-24 LAB — POCT I-STAT, CHEM 8
BUN: 46 mg/dL — ABNORMAL HIGH (ref 8–23)
Calcium, Ion: 1.01 mmol/L — ABNORMAL LOW (ref 1.15–1.40)
Chloride: 106 mmol/L (ref 98–111)
Creatinine, Ser: 6.6 mg/dL — ABNORMAL HIGH (ref 0.44–1.00)
Glucose, Bld: 120 mg/dL — ABNORMAL HIGH (ref 70–99)
HCT: 29 % — ABNORMAL LOW (ref 36.0–46.0)
Hemoglobin: 9.9 g/dL — ABNORMAL LOW (ref 12.0–15.0)
Potassium: 5.7 mmol/L — ABNORMAL HIGH (ref 3.5–5.1)
Sodium: 132 mmol/L — ABNORMAL LOW (ref 135–145)
TCO2: 20 mmol/L — ABNORMAL LOW (ref 22–32)

## 2024-06-24 LAB — GLUCOSE, CAPILLARY
Glucose-Capillary: 100 mg/dL — ABNORMAL HIGH (ref 70–99)
Glucose-Capillary: 135 mg/dL — ABNORMAL HIGH (ref 70–99)
Glucose-Capillary: 160 mg/dL — ABNORMAL HIGH (ref 70–99)
Glucose-Capillary: 180 mg/dL — ABNORMAL HIGH (ref 70–99)
Glucose-Capillary: 151 mg/dL — ABNORMAL HIGH (ref 70–99)

## 2024-06-24 LAB — CBC WITH DIFFERENTIAL/PLATELET
Abs Immature Granulocytes: 0.03 K/uL (ref 0.00–0.07)
Basophils Absolute: 0.1 K/uL (ref 0.0–0.1)
Basophils Relative: 1 %
Eosinophils Absolute: 0.2 K/uL (ref 0.0–0.5)
Eosinophils Relative: 2 %
HCT: 27.9 % — ABNORMAL LOW (ref 36.0–46.0)
Hemoglobin: 8.5 g/dL — ABNORMAL LOW (ref 12.0–15.0)
Immature Granulocytes: 0 %
Lymphocytes Relative: 16 %
Lymphs Abs: 1.3 K/uL (ref 0.7–4.0)
MCH: 26.6 pg (ref 26.0–34.0)
MCHC: 30.5 g/dL (ref 30.0–36.0)
MCV: 87.5 fL (ref 80.0–100.0)
Monocytes Absolute: 1.2 K/uL — ABNORMAL HIGH (ref 0.1–1.0)
Monocytes Relative: 15 %
Neutro Abs: 5.3 K/uL (ref 1.7–7.7)
Neutrophils Relative %: 66 %
Platelets: 128 K/uL — ABNORMAL LOW (ref 150–400)
RBC: 3.19 MIL/uL — ABNORMAL LOW (ref 3.87–5.11)
RDW: 22.1 % — ABNORMAL HIGH (ref 11.5–15.5)
WBC: 8 K/uL (ref 4.0–10.5)
nRBC: 0 % (ref 0.0–0.2)

## 2024-06-24 LAB — CK: Total CK: 25 U/L — ABNORMAL LOW (ref 38–234)

## 2024-06-24 SURGERY — REVISION AMPUTATION, BELOW THE KNEE
Anesthesia: General | Site: Leg Lower | Laterality: Right

## 2024-06-24 MED ORDER — COLLAGENASE 250 UNIT/GM EX OINT
TOPICAL_OINTMENT | Freq: Every day | CUTANEOUS | Status: DC
  Administered 2024-06-26: 1 via TOPICAL
  Filled 2024-06-24 (×3): qty 30

## 2024-06-24 MED ORDER — SODIUM CHLORIDE 0.9% FLUSH
3.0000 mL | Freq: Two times a day (BID) | INTRAVENOUS | Status: DC
  Administered 2024-06-24 – 2024-06-30 (×13): 3 mL via INTRAVENOUS

## 2024-06-24 MED ORDER — CEFAZOLIN SODIUM-DEXTROSE 2-4 GM/100ML-% IV SOLN: 2.0000 g | INTRAVENOUS | Status: DC

## 2024-06-24 MED ORDER — ONDANSETRON HCL 4 MG/2ML IJ SOLN
4.0000 mg | Freq: Four times a day (QID) | INTRAMUSCULAR | Status: DC | PRN
  Administered 2024-06-24 – 2024-06-30 (×3): 4 mg via INTRAVENOUS
  Filled 2024-06-24 (×4): qty 2

## 2024-06-24 MED ORDER — METOPROLOL TARTRATE 5 MG/5ML IV SOLN: 2.0000 mg | INTRAVENOUS | Status: DC | PRN

## 2024-06-24 MED ORDER — SODIUM CHLORIDE 0.9 % IV SOLN: 250.0000 mL | INTRAVENOUS | Status: AC | PRN

## 2024-06-24 MED ORDER — SODIUM CHLORIDE 0.9 % IV SOLN
10.0000 mg/kg | INTRAVENOUS | Status: DC
  Administered 2024-06-25 – 2024-06-28 (×3): 600 mg via INTRAVENOUS
  Filled 2024-06-24 (×5): qty 12

## 2024-06-24 MED ORDER — ACETAMINOPHEN 325 MG PO TABS: 325.0000 mg | ORAL_TABLET | Freq: Four times a day (QID) | ORAL | Status: DC | PRN

## 2024-06-24 MED ORDER — OXYCODONE HCL 5 MG PO TABS
5.0000 mg | ORAL_TABLET | ORAL | Status: DC | PRN
  Administered 2024-06-29: 10 mg via ORAL
  Filled 2024-06-24: qty 2

## 2024-06-24 MED ORDER — LACTATED RINGERS IV SOLN: INTRAVENOUS | Status: DC | PRN

## 2024-06-24 MED ORDER — CHLORHEXIDINE GLUCONATE CLOTH 2 % EX PADS
6.0000 | MEDICATED_PAD | Freq: Every day | CUTANEOUS | Status: DC
Start: 2024-06-25 — End: 2024-06-28
  Administered 2024-06-26 – 2024-06-27 (×2): 6 via TOPICAL

## 2024-06-24 MED ORDER — CHLORHEXIDINE GLUCONATE CLOTH 2 % EX PADS
6.0000 | MEDICATED_PAD | Freq: Every day | CUTANEOUS | Status: DC
  Administered 2024-06-26 – 2024-06-28 (×3): 6 via TOPICAL

## 2024-06-24 MED ORDER — POTASSIUM CHLORIDE CRYS ER 20 MEQ PO TBCR: 40.0000 meq | EXTENDED_RELEASE_TABLET | Freq: Every day | ORAL | Status: DC | PRN

## 2024-06-24 MED ORDER — ONDANSETRON HCL 4 MG/2ML IJ SOLN
INTRAMUSCULAR | Status: AC
Start: 2024-06-24 — End: 2024-06-24
  Filled 2024-06-24: qty 2

## 2024-06-24 MED ORDER — OXYCODONE HCL 5 MG PO TABS: 5.0000 mg | ORAL_TABLET | Freq: Once | ORAL | Status: DC | PRN

## 2024-06-24 MED ORDER — PROPOFOL 10 MG/ML IV BOLUS
INTRAVENOUS | Status: DC | PRN
  Administered 2024-06-24: 110 mg via INTRAVENOUS

## 2024-06-24 MED ORDER — ZINC SULFATE 220 (50 ZN) MG PO CAPS
220.0000 mg | ORAL_CAPSULE | Freq: Every day | ORAL | Status: DC
Start: 2024-06-24 — End: 2024-06-30
  Administered 2024-06-24 – 2024-06-30 (×7): 220 mg via ORAL
  Filled 2024-06-24 (×7): qty 1

## 2024-06-24 MED ORDER — DOCUSATE SODIUM 100 MG PO CAPS
100.0000 mg | ORAL_CAPSULE | Freq: Every day | ORAL | Status: DC
  Administered 2024-06-26: 100 mg via ORAL
  Filled 2024-06-24 (×5): qty 1

## 2024-06-24 MED ORDER — FENTANYL CITRATE (PF) 100 MCG/2ML IJ SOLN
25.0000 ug | INTRAMUSCULAR | Status: DC | PRN
  Administered 2024-06-24: 50 ug via INTRAVENOUS

## 2024-06-24 MED ORDER — CHLORHEXIDINE GLUCONATE 0.12 % MT SOLN
OROMUCOSAL | Status: AC
  Administered 2024-06-24: 15 mL via OROMUCOSAL
  Filled 2024-06-24: qty 15

## 2024-06-24 MED ORDER — CHLORHEXIDINE GLUCONATE 0.12 % MT SOLN: 15.0000 mL | Freq: Once | OROMUCOSAL | Status: AC

## 2024-06-24 MED ORDER — JUVEN PO PACK
1.0000 | PACK | Freq: Two times a day (BID) | ORAL | Status: DC
  Administered 2024-06-24 – 2024-06-29 (×8): 1 via ORAL
  Filled 2024-06-24 (×8): qty 1

## 2024-06-24 MED ORDER — PHENYLEPHRINE 80 MCG/ML (10ML) SYRINGE FOR IV PUSH (FOR BLOOD PRESSURE SUPPORT)
PREFILLED_SYRINGE | INTRAVENOUS | Status: DC | PRN
  Administered 2024-06-24: 160 ug via INTRAVENOUS
  Administered 2024-06-24: 80 ug via INTRAVENOUS
  Administered 2024-06-24: 160 ug via INTRAVENOUS

## 2024-06-24 MED ORDER — FENTANYL CITRATE (PF) 250 MCG/5ML IJ SOLN
INTRAMUSCULAR | Status: DC | PRN
  Administered 2024-06-24: 50 ug via INTRAVENOUS

## 2024-06-24 MED ORDER — LACTATED RINGERS IV SOLN: INTRAVENOUS | Status: DC

## 2024-06-24 MED ORDER — OXYCODONE HCL 5 MG/5ML PO SOLN: 5.0000 mg | Freq: Once | ORAL | Status: DC | PRN

## 2024-06-24 MED ORDER — LIDOCAINE 2% (20 MG/ML) 5 ML SYRINGE
INTRAMUSCULAR | Status: DC | PRN
  Administered 2024-06-24: 100 mg via INTRAVENOUS

## 2024-06-24 MED ORDER — PHENOL 1.4 % MT LIQD: 1.0000 | OROMUCOSAL | Status: DC | PRN

## 2024-06-24 MED ORDER — ISOSORBIDE DINITRATE 20 MG PO TABS
20.0000 mg | ORAL_TABLET | Freq: Every day | ORAL | Status: DC
  Administered 2024-06-24 – 2024-06-30 (×7): 20 mg via ORAL
  Filled 2024-06-24 (×7): qty 1

## 2024-06-24 MED ORDER — ONDANSETRON HCL 4 MG/2ML IJ SOLN
INTRAMUSCULAR | Status: DC | PRN
  Administered 2024-06-24: 4 mg via INTRAVENOUS

## 2024-06-24 MED ORDER — INSULIN ASPART 100 UNIT/ML IJ SOLN
0.0000 [IU] | Freq: Three times a day (TID) | INTRAMUSCULAR | Status: DC
  Administered 2024-06-24 – 2024-06-25 (×2): 2 [IU] via SUBCUTANEOUS
  Administered 2024-06-26: 1 [IU] via SUBCUTANEOUS
  Administered 2024-06-27: 2 [IU] via SUBCUTANEOUS
  Administered 2024-06-28 (×2): 1 [IU] via SUBCUTANEOUS
  Administered 2024-06-28: 2 [IU] via SUBCUTANEOUS
  Administered 2024-06-29: 1 [IU] via SUBCUTANEOUS

## 2024-06-24 MED ORDER — FENTANYL CITRATE (PF) 100 MCG/2ML IJ SOLN
INTRAMUSCULAR | Status: AC
  Filled 2024-06-24: qty 2

## 2024-06-24 MED ORDER — HYDRALAZINE HCL 20 MG/ML IJ SOLN: 5.0000 mg | INTRAMUSCULAR | Status: DC | PRN

## 2024-06-24 MED ORDER — SODIUM CHLORIDE 0.9 % IV SOLN: 1.0000 g | INTRAVENOUS | Status: DC

## 2024-06-24 MED ORDER — SODIUM CHLORIDE 0.9% FLUSH: 3.0000 mL | INTRAVENOUS | Status: DC | PRN

## 2024-06-24 MED ORDER — VASHE WOUND IRRIGATION OPTIME
TOPICAL | Status: DC | PRN
  Administered 2024-06-24: 34 [oz_av]

## 2024-06-24 MED ORDER — DEXAMETHASONE SODIUM PHOSPHATE 10 MG/ML IJ SOLN
INTRAMUSCULAR | Status: AC
  Filled 2024-06-24: qty 1

## 2024-06-24 MED ORDER — OXYCODONE HCL 5 MG PO TABS
10.0000 mg | ORAL_TABLET | ORAL | Status: DC | PRN
  Administered 2024-06-24 – 2024-06-28 (×7): 15 mg via ORAL
  Filled 2024-06-24 (×7): qty 3

## 2024-06-24 MED ORDER — SODIUM CHLORIDE 0.9 % IV SOLN: INTRAVENOUS | Status: DC

## 2024-06-24 MED ORDER — ACETAMINOPHEN 10 MG/ML IV SOLN: 1000.0000 mg | Freq: Once | INTRAVENOUS | Status: DC | PRN

## 2024-06-24 MED ORDER — LINEZOLID 600 MG PO TABS
600.0000 mg | ORAL_TABLET | Freq: Two times a day (BID) | ORAL | Status: DC
Start: 2024-06-24 — End: 2024-06-24
  Filled 2024-06-24 (×2): qty 1

## 2024-06-24 MED ORDER — FENTANYL CITRATE (PF) 250 MCG/5ML IJ SOLN
INTRAMUSCULAR | Status: AC
  Filled 2024-06-24: qty 5

## 2024-06-24 MED ORDER — SODIUM CHLORIDE 0.9 % IV SOLN
1.0000 g | INTRAVENOUS | Status: DC
  Administered 2024-06-24: 1 g via INTRAVENOUS
  Filled 2024-06-24 (×2): qty 1

## 2024-06-24 MED ORDER — MIDODRINE HCL 5 MG PO TABS
2.5000 mg | ORAL_TABLET | Freq: Every day | ORAL | Status: DC | PRN
  Administered 2024-06-25 – 2024-06-29 (×3): 2.5 mg via ORAL
  Filled 2024-06-24 (×2): qty 1

## 2024-06-24 MED ORDER — LIDOCAINE 2% (20 MG/ML) 5 ML SYRINGE
INTRAMUSCULAR | Status: AC
  Filled 2024-06-24: qty 5

## 2024-06-24 MED ORDER — HYDROMORPHONE HCL 1 MG/ML IJ SOLN
0.5000 mg | INTRAMUSCULAR | Status: DC | PRN
  Administered 2024-06-24: 1 mg via INTRAVENOUS
  Filled 2024-06-24: qty 1

## 2024-06-24 MED ORDER — LABETALOL HCL 5 MG/ML IV SOLN: 10.0000 mg | INTRAVENOUS | Status: DC | PRN

## 2024-06-24 MED ORDER — MELATONIN 5 MG PO TABS
10.0000 mg | ORAL_TABLET | Freq: Every evening | ORAL | Status: DC | PRN
  Administered 2024-06-24 – 2024-06-29 (×6): 10 mg via ORAL
  Filled 2024-06-24 (×6): qty 2

## 2024-06-24 MED ORDER — BRIMONIDINE TARTRATE 0.2 % OP SOLN
1.0000 [drp] | Freq: Two times a day (BID) | OPHTHALMIC | Status: DC
  Administered 2024-06-24 – 2024-06-30 (×12): 1 [drp] via OPHTHALMIC
  Filled 2024-06-24: qty 5

## 2024-06-24 MED ORDER — CARVEDILOL 25 MG PO TABS
25.0000 mg | ORAL_TABLET | Freq: Two times a day (BID) | ORAL | Status: DC
  Administered 2024-06-24 – 2024-06-30 (×8): 25 mg via ORAL
  Filled 2024-06-24 (×10): qty 1

## 2024-06-24 MED ORDER — ORAL CARE MOUTH RINSE: 15.0000 mL | Freq: Once | OROMUCOSAL | Status: AC

## 2024-06-24 MED ORDER — ACETAMINOPHEN 500 MG PO TABS
1000.0000 mg | ORAL_TABLET | Freq: Four times a day (QID) | ORAL | Status: AC
  Administered 2024-06-24 – 2024-06-25 (×3): 1000 mg via ORAL
  Filled 2024-06-24 (×3): qty 2

## 2024-06-24 MED ORDER — LATANOPROST 0.005 % OP SOLN
1.0000 [drp] | Freq: Every day | OPHTHALMIC | Status: DC
  Administered 2024-06-24 – 2024-06-29 (×6): 1 [drp] via OPHTHALMIC
  Filled 2024-06-24: qty 2.5

## 2024-06-24 MED ORDER — CEFAZOLIN SODIUM-DEXTROSE 2-4 GM/100ML-% IV SOLN
INTRAVENOUS | Status: AC
  Filled 2024-06-24: qty 100

## 2024-06-24 MED ORDER — DEXAMETHASONE SODIUM PHOSPHATE 10 MG/ML IJ SOLN
INTRAMUSCULAR | Status: DC | PRN
  Administered 2024-06-24: 5 mg via INTRAVENOUS

## 2024-06-24 MED ORDER — ONDANSETRON HCL 4 MG/2ML IJ SOLN: 4.0000 mg | Freq: Once | INTRAMUSCULAR | Status: DC | PRN

## 2024-06-24 MED ORDER — CEFAZOLIN SODIUM-DEXTROSE 2-3 GM-%(50ML) IV SOLR
INTRAVENOUS | Status: DC | PRN
Start: 2024-06-24 — End: 2024-06-24
  Administered 2024-06-24: 2 g via INTRAVENOUS

## 2024-06-24 SURGICAL SUPPLY — 35 items
BAG COUNTER SPONGE SURGICOUNT (BAG) IMPLANT
BLADE SAW RECIP 87.9 MT (BLADE) ×1 IMPLANT
BLADE SURG 21 STRL SS (BLADE) ×1 IMPLANT
BNDG COHESIVE 6X5 TAN NS LF (GAUZE/BANDAGES/DRESSINGS) IMPLANT
BNDG COHESIVE 6X5 TAN ST LF (GAUZE/BANDAGES/DRESSINGS) IMPLANT
CANISTER WOUND CARE 500ML ATS (WOUND CARE) ×1 IMPLANT
COVER SURGICAL LIGHT HANDLE (MISCELLANEOUS) ×1 IMPLANT
CUFF TRNQT CYL 34X4.125X (TOURNIQUET CUFF) ×1 IMPLANT
DRAPE INCISE IOBAN 66X45 STRL (DRAPES) ×1 IMPLANT
DRAPE U-SHAPE 47X51 STRL (DRAPES) ×1 IMPLANT
DRESSING PREVENA PLUS CUSTOM (GAUZE/BANDAGES/DRESSINGS) ×1 IMPLANT
DRSG VAC PEEL AND PLACE LRG (GAUZE/BANDAGES/DRESSINGS) IMPLANT
DURAPREP 26ML APPLICATOR (WOUND CARE) ×1 IMPLANT
ELECTRODE REM PT RTRN 9FT ADLT (ELECTROSURGICAL) ×1 IMPLANT
GLOVE BIOGEL PI IND STRL 9 (GLOVE) ×1 IMPLANT
GLOVE SURG ORTHO 9.0 STRL STRW (GLOVE) ×1 IMPLANT
GOWN STRL REUS W/ TWL XL LVL3 (GOWN DISPOSABLE) ×2 IMPLANT
GRAFT SKIN WND MICRO 38 (Tissue) IMPLANT
KIT BASIN OR (CUSTOM PROCEDURE TRAY) ×1 IMPLANT
KIT TURNOVER KIT B (KITS) ×1 IMPLANT
MANIFOLD NEPTUNE II (INSTRUMENTS) ×1 IMPLANT
PACK ORTHO EXTREMITY (CUSTOM PROCEDURE TRAY) ×1 IMPLANT
PAD ARMBOARD POSITIONER FOAM (MISCELLANEOUS) ×1 IMPLANT
PREVENA RESTOR ARTHOFORM 46X30 (CANNISTER) ×1 IMPLANT
SOLN 0.9% NACL 1000 ML (IV SOLUTION) ×1 IMPLANT
SOLN 0.9% NACL POUR BTL 1000ML (IV SOLUTION) ×1 IMPLANT
SPONGE T-LAP 18X18 ~~LOC~~+RFID (SPONGE) IMPLANT
STAPLER SKIN PROX 35W (STAPLE) IMPLANT
STOCKINETTE IMPERVIOUS LG (DRAPES) ×1 IMPLANT
SUT ETHILON 2 0 PSLX (SUTURE) IMPLANT
SUT SILK 2-0 18XBRD TIE 12 (SUTURE) ×1 IMPLANT
SUT VIC AB 1 CTX 27 (SUTURE) ×2 IMPLANT
TOWEL GREEN STERILE (TOWEL DISPOSABLE) ×1 IMPLANT
TUBE CONNECTING 12X1/4 (SUCTIONS) ×1 IMPLANT
YANKAUER SUCT BULB TIP NO VENT (SUCTIONS) ×1 IMPLANT

## 2024-06-24 NOTE — Anesthesia Postprocedure Evaluation (Signed)
 Anesthesia Post Note  Patient: [name]  Procedure(s) Performed: REVISION AMPUTATION, BELOW THE KNEE (Right: Leg Lower)     Patient location during evaluation: PACU Anesthesia Type: General Level of consciousness: awake and alert Pain management: pain level controlled Vital Signs Assessment: post-procedure vital signs reviewed and stable Respiratory status: spontaneous breathing, nonlabored ventilation, respiratory function stable and patient connected to nasal cannula oxygen Cardiovascular status: blood pressure returned to baseline and stable Postop Assessment: no apparent nausea or vomiting Anesthetic complications: no   No notable events documented.  Last Vitals:  Vitals:   06/24/24 1130 06/24/24 1200  BP: (!) 104/52 (!) 161/61  Pulse: 69 77  Resp: 14 15  Temp: (!) 36.4 C 36.7 C  SpO2: 100% 100%    Last Pain:  Vitals:   06/24/24 1303  TempSrc:   PainSc: 10-Worst pain ever                 [name]

## 2024-06-24 NOTE — Transfer of Care (Signed)
 Immediate Anesthesia Transfer of Care Note  Patient: [name]  Procedure(s) Performed: REVISION AMPUTATION, BELOW THE KNEE (Right: Leg Lower)  Patient Location: PACU  Anesthesia Type:General  Level of Consciousness: awake and drowsy, oriented    Airway & Oxygen Therapy: Patient Spontanous Breathing  Post-op Assessment: Report given to RN and Post -op Vital signs reviewed and stable  Post vital signs: Reviewed and stable  Last Vitals:  Vitals Value Taken Time  BP 114/60 06/24/24 10:45  Temp    Pulse 66 06/24/24 10:49  Resp 14 06/24/24 10:49  SpO2 100 % 06/24/24 10:49  Vitals shown include unfiled device data.  Last Pain:  Vitals:   06/24/24 0940  TempSrc:   PainSc: 7          Complications: No notable events documented.

## 2024-06-24 NOTE — Op Note (Signed)
 06/24/2024  10:51 AM  PATIENT:  [name]    PRE-OPERATIVE DIAGNOSIS:  Dehiscence Right Below Knee Amputation  POST-OPERATIVE DIAGNOSIS:  Same  PROCEDURE:  REVISION AMPUTATION, BELOW THE KNEE Tissue sent for cultures. Application Kerecis micro graft 38 cm. Application peel in place wound VAC with stump shrinker.  SURGEON:  Jerona LULLA Sage, MD  PHYSICIAN ASSISTANT:None ANESTHESIA:   General  PREOPERATIVE INDICATIONS:  [name] is a  68 y.o. female with a diagnosis of Dehiscence Right Below Knee Amputation who failed conservative measures and elected for surgical management.    The risks benefits and alternatives were discussed with the patient preoperatively including but not limited to the risks of infection, bleeding, nerve injury, cardiopulmonary complications, the need for revision surgery, among others, and the patient was willing to proceed.  OPERATIVE IMPLANTS:   Implant Name Type Inv. Item Serial No. Manufacturer Lot No. LRB No. Used Action  GRAFT SKIN WND MICRO 38 - [id number] Tissue GRAFT SKIN WND MICRO 38  [employer] [id number] Right 1 Implanted    @ENCIMAGES @  OPERATIVE FINDINGS: Patient had deep inflamed tissue however there was no purulence no abscess no necrotic tissue.  Tissue was sent for cultures.  OPERATIVE PROCEDURE: Patient was brought the operating room and underwent a general anesthetic.  After adequate levels anesthesia were obtained patient's right lower extremity was prepped using DuraPrep draped into a sterile field a timeout was called.  A fishmouth incision was made around the ulcerative dehisced tissue this was carried sharply down to bone.  The distal 2 cm of tibia and fibula were resected and the tibia was beveled anteriorly.  The involved inflammatory tissue was all resected in 1 block of tissue.  Tissue margins were clear.  Electrocautery was used for hemostasis.  The wound bed was irrigated with Vashe.  The soft tissue was then  reinforced with Kerecis micro graft 38 cm to cover wound surface area greater than 100 cm.  The incision was closed using 2-0 nylon.  A peel in place wound VAC sponge was applied this had a good suction fit this was overwrapped with a stump shrinker patient was extubated taken the PACU in stable condition.  Continue antibiotics and adjust according to tissue cultures.   DISCHARGE PLANNING:  Antibiotic duration: Continue antibiotics and adjust according to tissue cultures.  Weightbearing: Nonweightbearing on the right  Pain medication: Opioid pathway  Dressing care/ Wound VAC: Wound VAC for 1 week  Ambulatory devices: Walker  Discharge to: Discharge planning based on therapy recommendations.  Plan for dialysis Wednesday and Friday with possible discharge Friday.  Follow-up: In the office 1 week post operative.

## 2024-06-24 NOTE — Consult Note (Signed)
 Renal Service Consult Note [facility] Connie JONETTA Fret, MD  Patient: [name] Date: 06/24/2024 Requesting Physician: Dr. Harden  Reason for Consult: ESRD pt s/p BKA revision surgery HPI: The patient is a 68 y.o. year-old w/ PMH as below who presented to hospital for elective BKA dehiscence surgery. Pt was in the OR this am. BP 160/60, HR 77, RR 15, temp 98.1 deg.  K+ 4.1, creat 6.23, Hb 8.5.  WBC 8K. Pt is now admitted post op. We are asked to see for dialysis.    Pt seen in reom. Surgery went well. No c/o's at this time. No SOB / CP.   ROS - denies CP, no joint pain, no HA, no blurry vision, no rash, no diarrhea, no nausea/ vomiting   Past Medical History  Past Medical History:  Diagnosis Date   Anemia    Arthritis    Atrial fibrillation (HCC)    CAD (coronary artery disease)    Chronic kidney disease, stage III (moderate) (HCC)    Constipation    Diabetes mellitus (HCC)    ESRD on dialysis (HCC) 04/20/2024   History of blood transfusion    HLD (hyperlipidemia)    Hypertension    Myocardial infarction [facility])    in April 2014   Septic shock (HCC) 04/19/2024   Wound infection after surgery 03/17/2013   Past Surgical History  Past Surgical History:  Procedure Laterality Date   AMPUTATION Right 04/20/2024   Procedure: AMPUTATION BELOW KNEE;  Surgeon: Harden Jerona GAILS, MD;  Location: [facility] OR;  Service: Orthopedics;  Laterality: Right;   ANKLE SURGERY Right    x 6 total   BREAST BIOPSY Left    15 or 20 yearsa ago she cant remember laterality    HARDWARE REMOVAL Right 02/13/2013   Procedure: HARDWARE REMOVAL;  Surgeon: Jerona GAILS Harden, MD;  Location: [facility] OR;  Service: Orthopedics;  Laterality: Right;  Right Ankle Removal Hardware, Debridement, Place Wound VAC   IR FLUORO GUIDE CV LINE LEFT  11/08/2023   IR FLUORO GUIDE CV LINE LEFT  04/23/2024   IR FLUORO GUIDE CV LINE RIGHT  11/01/2023   IR FLUORO GUIDE CV LINE RIGHT  11/12/2023   IR REMOVAL TUN CV CATH W/O FL   04/21/2024   IR US  GUIDE VASC ACCESS LEFT  11/08/2023   IR US  GUIDE VASC ACCESS LEFT  04/23/2024   IR US  GUIDE VASC ACCESS RIGHT  11/01/2023   IR US  GUIDE VASC ACCESS RIGHT  11/12/2023   laser surgery for diabetic retinopathy Bilateral    LEFT HEART CATHETERIZATION WITH CORONARY ANGIOGRAM N/A 01/09/2013   Procedure: LEFT HEART CATHETERIZATION WITH CORONARY ANGIOGRAM;  Surgeon: Toribio JONELLE Fuel, MD;  Location: [facility] CATH LAB;  Service: Cardiovascular;  Laterality: N/A;   ORIF ANKLE FRACTURE Right 01/12/2013   Procedure: OPEN REDUCTION INTERNAL FIXATION (ORIF) ANKLE FRACTURE;  Surgeon: Jerona GAILS Harden, MD;  Location: [facility] OR;  Service: Orthopedics;  Laterality: Right;   PARTIAL COLECTOMY N/A 11/02/2023   Procedure: OPEN PARTIAL COLECTOMY WITH END COLOSTOMY;  Surgeon: Dasie Leonor LITTIE, MD;  Location: [facility] OR;  Service: General;  Laterality: N/A;   PARTIAL HYSTERECTOMY     TUNNELLED CATHETER EXCHANGE N/A 04/15/2024   Procedure: TUNNELLED CATHETER EXCHANGE;  Surgeon: Pearline Norman RAMAN, MD;  Location: [facility] LAB;  Service: Cardiovascular;  Laterality: N/A;   Family History  Family History  Problem Relation Age of Onset   Diabetes Mother        No history  CAD   Colon cancer Mother 51       died at 58 from CRC   Hypertension Father        Also had CAD   Heart disease Father    Cervical cancer Sister    Diabetes Sister    Congestive Heart Failure Sister    Heart disease Maternal Grandmother    Diabetes Brother    Kidney disease Brother    Sickle cell anemia Son    Thyroid  disease Son    Breast cancer Neg Hx    BRCA 1/2 Neg Hx    Social History  reports that she has never smoked. She has never used smokeless tobacco. She reports that she does not drink alcohol  and does not use drugs. Allergies  Allergies  Allergen Reactions   Nsaids Other (See Comments)    CKD stage 4   Lisinopril Other (See Comments)    coughing   Peanut-Containing Drug Products Itching and Other (See Comments)    GI intolerance -  diarrhea   Home medications Prior to Admission medications   Medication Sig Start Date End Date Taking? Authorizing Provider  ACCU-CHEK AVIVA PLUS test strip CHECK BLOOD GLUCOSE THREE TIMES DAILY 11/05/22  Yes Joshua Domino, DO  acetaminophen  (TYLENOL ) 500 MG tablet Take 2 tablets (1,000 mg total) by mouth 4 (four) times daily. 06/09/24  Yes Myrna Bitters, DO  apixaban  (ELIQUIS ) 5 MG TABS tablet Take 1 tablet (5 mg total) by mouth 2 (two) times daily. 06/09/24  Yes King, Olivia, DO  brimonidine  (ALPHAGAN ) 0.2 % ophthalmic solution Place 1 drop into both eyes 2 (two) times daily.   Yes [provider]  calcitRIOL  (ROCALTROL ) 0.25 MCG capsule TAKE 1 CAPSULE (0.25 MCG TOTAL) EVERY MONDAY, WEDNESDAY, AND FRIDAY Patient taking differently: Take 0.25 mcg by mouth in the morning and at bedtime. 06/22/22  Yes Christia Budds, MD  carvedilol  (COREG ) 25 MG tablet Take 25 mg by mouth 2 (two) times daily with a meal.   Yes [provider]  cefTAZidime  1 g in sodium chloride  0.9 % 100 mL Inject 1 g into the vein every Monday, Wednesday, and Friday with hemodialysis. Stop date 07/09/24 06/01/24 07/09/24 Yes Dennise Kingsley, MD  insulin  glargine (LANTUS ) 100 UNIT/ML injection Inject 0.07 mLs (7 Units total) into the skin daily. Please schedule appointment before next refill. Patient taking differently: Inject 7-10 Units into the skin daily. Please schedule appointment before next refill. 02/01/24  Yes Yolande Connie BROCKS, MD  isosorbide  dinitrate (ISORDIL ) 20 MG tablet Take 20 mg by mouth daily.   Yes [provider]  linezolid  (ZYVOX ) 600 MG tablet Take 1 tablet (600 mg total) by mouth every 12 (twelve) hours. 06/09/24 07/17/24 Yes King, Olivia, DO  melatonin 5 MG TABS Take 10 mg by mouth at bedtime as needed (for insomnia).   Yes [provider]  Multiple Vitamin (MULTIVITAMIN WITH MINERALS) TABS Take 1 tablet by mouth daily.   Yes [provider]  NOVOLIN R 100 UNIT/ML  injection Inject 0-9 Units into the skin in the morning, at noon, and at bedtime. Per sliding scale 04/03/24  Yes [provider]  albuterol  (PROVENTIL ) (2.5 MG/3ML) 0.083% nebulizer solution Take 3 mLs (2.5 mg total) by nebulization every 2 (two) hours as needed for wheezing. 11/25/23   Ghimire, Donalda HERO, MD  ascorbic acid  (VITAMIN C ) 500 MG tablet Take 1 tablet (500 mg total) by mouth 2 (two) times daily. 06/09/24   King, Olivia, DO  Blood Glucose  Monitoring Suppl (ACCU-CHEK AVIVA PLUS) w/Device KIT CHECK BLOOD GLUCOSE THREE TIMES DAILY 10/26/20   Autry-Lott, Rojean, DO  collagenase  (SANTYL ) 250 UNIT/GM ointment Apply topically 2 (two) times daily. 06/09/24   King, Olivia, DO  Darbepoetin Alfa  (ARANESP ) 200 MCG/0.4ML SOSY injection Inject 0.4 mLs (200 mcg total) into the skin every Sunday at 6pm. 06/14/24   Myrna Bitters, DO  DROPLET INSULIN  SYRINGE 31G X 5/16 0.5 ML MISC USE FOUR TIMES DAILY FOR INJECTIONS 09/03/22   Joshua Domino, DO  HYDROmorphone  (DILAUDID ) 2 MG tablet Take 0.5 tablets (1 mg total) by mouth every 6 (six) hours as needed for severe pain (pain score 7-10) or moderate pain (pain score 4-6). 06/09/24   King, Olivia, DO  insulin  aspart (NOVOLOG  FLEXPEN) 100 UNIT/ML FlexPen 0-9 Units, Subcutaneous, 3 times daily with meals CBG < 70: Implement Hypoglycemia measures CBG 70 - 120: 0 units CBG 121 - 150: 1 unit CBG 151 - 200: 2 units CBG 201 - 250: 3 units CBG 251 - 300: 5 units CBG 301 - 350: 7 units CBG 351 - 400: 9 units CBG > 400: call MD 11/25/23   Raenelle Donalda HERO, MD  Insulin  Syringes, Disposable, U-100 0.5 ML MISC 4x daily injections 03/21/18   Riccio, Angela C, DO  Lancet Devices (ACCU-CHEK SOFTCLIX) lancets Fill for 1 month for TID testing. Use as instructed 11/13/13   Curtis Hadassah DASEN, MD  latanoprost  (XALATAN ) 0.005 % ophthalmic solution Place 1 drop into both eyes at bedtime.    [provider]  leptospermum manuka honey (MEDIHONEY) PSTE paste Apply 1 Application  topically 2 (two) times daily. Apply with wound changes IF santyl  not available. 06/09/24   King, Olivia, DO  metoprolol  tartrate (LOPRESSOR ) 25 MG tablet Take 1 tablet (25 mg total) by mouth 2 (two) times daily. 06/09/24   King, Olivia, DO  midodrine  (PROAMATINE ) 2.5 MG tablet Take 2.5 mg by mouth daily as needed (low bp).    [provider]  NEEDLE, DISP, 30 G (BD DISP NEEDLES) 30G X 1/2 MISC For 4x daily injections 11/05/15   Phelps, Jazma Y, DO  nutrition supplement, JUVEN, (JUVEN) PACK Take 1 packet by mouth 2 (two) times daily between meals. 06/09/24   King, Olivia, DO  zinc  sulfate, 50mg  elemental zinc , 220 (50 Zn) MG capsule Take 1 capsule (220 mg total) by mouth daily. 06/10/24   Myrna Bitters, DO     Vitals:   06/24/24 1116 06/24/24 1125 06/24/24 1130 06/24/24 1200  BP:   (!) 104/52 (!) 161/61  Pulse:  71 69 77  Resp:  10 14 15   Temp:   (!) 97.5 F (36.4 C) 98.1 F (36.7 C)  TempSrc:    Axillary  SpO2: 100% 100% 100% 100%  Weight:      Height:       Exam Gen alert, no distress Sclera anicteric, throat clear  [name] or bruits Chest clear bilat to bases RRR no MRG Abd soft ntnd no mass or ascites +bs Ext no LE or UE edema, no other edema Neuro is alert, Ox 3 , nf    LIJ TDC intact  Home bp meds: Coreg  25 bid Metoprolol  25 bid Midodrine  2.5 mg every day prn    OP HD: [facility] MWF 4h  B350   62.8kg  3K bath  TDC   Heparin  2500 Last OP HD 9/29, post wt 62.3kg Getting to dry wt Small wt gains 0.5 -1.5kg usually, occ 2-3kg Mircera 150 mcg q 2 wks,  last 9/19, due 10/3 on Friday   Assessment/ Plan: SP revision of L BKA: by Dr Harden on 10/1.  ESRD: on HD MWF. Plan is for HD today or tonight, possibly in am tomorrow depending on staffing.  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 here,     Myer Fret  MD [facility] 06/24/2024, 1:24 PM  Recent Labs  Lab 06/24/24 0948 06/24/24 0951  HGB 9.9* 8.5*  ALBUMIN   --  2.3*   CALCIUM   --  8.7*  CREATININE 6.60* 6.23*  K 5.7* 4.1   Inpatient medications:  acetaminophen   1,000 mg Oral Q6H   brimonidine   1 drop Both Eyes BID   carvedilol   25 mg Oral BID WC   [START ON 06/25/2024] docusate sodium   100 mg Oral Daily   insulin  aspart  0-9 Units Subcutaneous TID WC   isosorbide  dinitrate  20 mg Oral Daily   latanoprost   1 drop Both Eyes QHS   linezolid   600 mg Oral Q12H   nutrition supplement (JUVEN)  1 packet Oral BID BM   sodium chloride  flush  3 mL Intravenous Q12H   zinc  sulfate (50mg  elemental zinc )  220 mg Oral Daily    sodium chloride      [START ON 06/26/2024] cefTAZidime  (FORTAZ ) 1 g in sodium chloride  0.9 % 100 mL IVPB     sodium chloride , [START ON 06/25/2024] acetaminophen , hydrALAZINE , HYDROmorphone  (DILAUDID ) injection, labetalol , melatonin, metoprolol  tartrate, midodrine , ondansetron , oxyCODONE , oxyCODONE , phenol, potassium chloride , sodium chloride  flush

## 2024-06-24 NOTE — Progress Notes (Signed)
 Pt receives out-pt HD at [facility] on MWF 10:50 am chair time. Clinic can provide iv daptomycin at d/c if needed per charge RN. Will assist as needed.   Randine Mungo Dialysis Navigator [phone]

## 2024-06-24 NOTE — Progress Notes (Signed)
 Pharmacy Antibiotic Note  [name] is a 68 y.o. female admitted on 06/24/2024 with BKA dehiscense s/p I&D 10/1 with cultures sent. 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 Pharmacy has been consulted to transition linezolid  to daptomycin this admission and continue ceftazidime .   ESRD-MWF, last outpatient on 9/29 with plans to dialyze on schedule later today 10/1, or off-schedule tomorrow, 10/2 based on staffing. Will attempt to keep Ceftazidime  on MWF for now but will adjust to daily if HD gets off schedule.   Plan: - Daptomycin 600 mg (~10 mg/kg) every 48 hours - Add on CK to labs today, then will monitor weekly - Continue Ceftazidime  1g/MWF - will adjust timing to be given at 2000 - Will continue to follow HD schedule/duration, culture results, LOT, and antibiotic de-escalation plans   Height: 5' 7 (170.2 cm) Weight: 62.1 kg (137 lb) IBW/kg (Calculated) : 61.6  Temp (24hrs), Avg:98 F (36.7 C), Min:97.5 F (36.4 C), Max:98.6 F (37 C)  Recent Labs  Lab 06/24/24 0948 06/24/24 0951  WBC  --  8.0  CREATININE 6.60* 6.23*    Estimated Creatinine Clearance: 8.4 mL/min (A) (by C-G formula based on SCr of 6.23 mg/dL (H)).    Allergies  Allergen Reactions   Nsaids Other (See Comments)    CKD stage 4   Lisinopril Other (See Comments)    coughing   Peanut-Containing Drug Products Itching and Other (See Comments)    GI intolerance - diarrhea    Antimicrobials this admission: Linezolid /Ceftazidime  PTA Cefazolin  pre-op  10/1 x 1 Ceftazidime  10/1 >> Daptomycin 10/1 >>  Dose adjustments this admission: N/a  Microbiology results: 9/5 sacral bone cx >> proteus mirabilis + VRE 10/1 R-knee tissue cx >>  Thank you for allowing pharmacy to be a part of this patient's care.  Almarie Lunger, PharmD, BCPS, BCIDP Infectious Diseases Clinical Pharmacist 06/24/2024 3:55 PM   **Pharmacist  phone directory can now be found on [email or web address] ([email or web address]).  Listed under [facility].

## 2024-06-24 NOTE — Progress Notes (Signed)
 [facility]   Nephrology was consulted for HD while patient is admitted.  I spoke with Dr. Geralynn.  She has HD MWF.  Maurilio Deland Collet PA-C [phone]

## 2024-06-24 NOTE — Anesthesia Preprocedure Evaluation (Addendum)
 Anesthesia Evaluation  Patient identified by MRN, date of birth, ID band Patient awake    Reviewed: Allergy & Precautions, NPO status , Patient's Chart, lab work & pertinent test results, reviewed documented beta blocker date and time   Airway Mallampati: III  TM Distance: >3 FB     Dental  (+) Partial Upper   Pulmonary neg COPD   breath sounds clear to auscultation       Cardiovascular hypertension, pulmonary hypertension+ CAD and + Past MI  + Valvular Problems/Murmurs  Rhythm:Regular Rate:Normal  IMPRESSIONS     1. Left ventricular ejection fraction, by estimation, is 50 to 55%. The  left ventricle has low normal function. Left ventricular endocardial  border not optimally defined to evaluate regional wall motion. There is  mild left ventricular hypertrophy. Left  ventricular diastolic parameters are consistent with Grade I diastolic  dysfunction (impaired relaxation).   2. Right ventricular systolic function is mildly reduced. The right  ventricular size is mildly enlarged. There is moderately elevated  pulmonary artery systolic pressure. The estimated right ventricular  systolic pressure is 51.0 mmHg.   3. The mitral valve is degenerative. Trivial mitral valve regurgitation.  No evidence of mitral stenosis.   4. The aortic valve is tricuspid. There is mild calcification of the  aortic valve. Aortic valve regurgitation is trivial. No aortic stenosis is  present.   5. The inferior vena cava is dilated in size with <50% respiratory  variability, suggesting right atrial pressure of 15 mmHg.   6. Nondiagnostic agitated saline injection due to image quality.      Neuro/Psych neg Seizures    GI/Hepatic ,,,(+) neg Cirrhosis        Endo/Other  diabetes, Type 2    Renal/GU ESRF and DialysisRenal disease     Musculoskeletal  (+) Arthritis ,    Abdominal   Peds  Hematology  (+) Blood dyscrasia, anemia    Anesthesia Other Findings   Reproductive/Obstetrics                              Anesthesia Physical Anesthesia Plan  ASA: 3  Anesthesia Plan: General   Post-op Pain Management:    Induction: Intravenous  PONV Risk Score and Plan: 2 and Ondansetron  and Dexamethasone   Airway Management Planned: LMA  Additional Equipment:   Intra-op Plan:   Post-operative Plan: Extubation in OR  Informed Consent: I have reviewed the patients History and Physical, chart, labs and discussed the procedure including the risks, benefits and alternatives for the proposed anesthesia with the patient or authorized representative who has indicated his/her understanding and acceptance.     Dental advisory given  Plan Discussed with: CRNA  Anesthesia Plan Comments:          Anesthesia Quick Evaluation

## 2024-06-24 NOTE — Consult Note (Addendum)
 WOC Nurse Consult Note:  Patient has established colostomy LLQ, mid-abd incisional line Reason for Consult:sacrum wound and ostomy Wound type: Surgical wound, previous history of stage 4 Pressure Injury POA: Yes Measurement: 6 x 7 x 6 cm, undermining rim 9 o'clock to 4 o' clock Wound bed: clean cavity, red, granulated tissue, moist, small amount of non-viable tissue Drainage (amount, consistency, odor) serous Periwound:no induration, macerated tissue, scaring, hypopigmentation Dressing procedure/placement/frequency:Daily  Cleanse wound with normal saline, pat dry and apply a thick layer of Santyl  ointment (pharmacy) into cavity, cover with moist normal saline gauze, dry ABD or gauze overlay, then secure in place with Mepilex sacrum foam dressing; foam dressing can be changed every 2-3 days.  Patient would like to resume current wound care being done by CG at home, has VAC on surgical site; unit functioning well without any alarms, touch base with primary nurse via secure chat. It appears that providers are managing NPWT.  WOC Nurse ostomy consult note Stoma type/location: colostomy Stomal assessment/size: approx 30 mm seen through appliance pouch Peristomal assessment: has appliance in place Treatment options for stomal/peristomal skin: none Ostomy pouching: 1pc patient uses Hollister 2 1/4 with barrier rings on both sides of abd crease and around stoma Education provided: reviewed how to prevent leakage from abd creases, patient is well informed and stated it was trial and error to get a good pouching technique down.  Enrolled patient in [employer] DC program: no Supplies: 1-piece ostomy 2 1/4 #725, barrier ring 2 inch [phone]  Placed ostomy management orders for staff to follow, current appliance was intact, patient does her own ostomy change.  Will place patient on follow-up weekly.   06/24/24 Buttock    Sherrilyn Hals MSN RN [facility] WOC [facility]   [phone] (Available from 7-3 pm Mon-Friday)

## 2024-06-24 NOTE — Interval H&P Note (Signed)
 History and Physical Interval Note:  06/24/2024 9:19 AM  [name]  has presented today for surgery, with the diagnosis of Dehiscence Right Below Knee Amputation.  The various methods of treatment have been discussed with the patient and family. After consideration of risks, benefits and other options for treatment, the patient has consented to  Procedure(s) with comments: REVISION AMPUTATION, BELOW THE KNEE (Right) - REVISION RIGHT BELOW KNEE AMPUTATION as a surgical intervention.  The patient's history has been reviewed, patient examined, no change in status, stable for surgery.  I have reviewed the patient's chart and labs.  Questions were answered to the patient's satisfaction.     Vivi Piccirilli V Dalila Arca

## 2024-06-24 NOTE — Anesthesia Procedure Notes (Signed)
 Procedure Name: LMA Insertion Date/Time: 06/24/2024 10:06 AM  Performed by: Scherrie Mast, CRNAPre-anesthesia Checklist: Patient identified, Emergency Drugs available, Suction available and Patient being monitored Patient Re-evaluated:Patient Re-evaluated prior to induction Oxygen Delivery Method: Circle System Utilized Preoxygenation: Pre-oxygenation with 100% oxygen Induction Type: IV induction LMA: LMA inserted LMA Size: 4.0 Number of attempts: 1 Airway Equipment and Method: Bite block Placement Confirmation: positive ETCO2 Tube secured with: Tape Dental Injury: Teeth and Oropharynx as per pre-operative assessment  Comments: LMA placed by Dr.Buck MD/DO

## 2024-06-24 NOTE — Consult Note (Signed)
 [facility]    Date of Admission:  06/24/2024     Total days of antibiotics   Linezolid  PTA  Ceftazidime  PTA   Daptomycin 10/01              Reason for Consult: Wound dehiscence, known OM sacrum on ABX     Referring Provider: Harden Petrin Care Provider: Campbell Reynolds, NP   Assessment: [name] is a 68 y.o. female admitted with    Wound Dehiscence of Rt BKA -  Exposed bone -  No purulence or necrotic tissue but inflammed appearing. Cultures are cooking. Concern for difficult to treat/resistant bacteria given this developed on broad spectrum abx.  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 Continue ceftaz - end date 10/16 as planned (pending #1 evaluation)   Thrombocytopenia -  Most likely related to prolonged linezolid  (this was changed by Dr. Eben and IMT on 9/12 when VRE noted in wound). It was discussed to do weekly CBC however this was not done after discharge from what I can tell.  Should rebound after stopping the drug.   H/O MRSA Bacteremia with possible vertebral infection -  Conservatively ongoing coverage with now daptomycin. Should be well treated now.  9/05 ESR 17, CRP 4.1 --> will repeat   ESRD -  Will try to get dialysis friendly regimen for her  Plan: Stop linezolid  (myelosuppression) Start daptomycin to complete treatment of polymicrobial wound infection through 10/16 Continue ceftaz through 10/16 Wound care / offloading  FU micro cultures from debridement today on R BKA revision    Principal Problem:   Non-healing wound of amputation stump (HCC)    acetaminophen   1,000 mg Oral Q6H   brimonidine   1 drop Both Eyes BID   carvedilol   25 mg Oral BID WC   [START ON 06/25/2024] Chlorhexidine   Gluconate Cloth  6 each Topical Q0600   collagenase    Topical Daily   [START ON 06/25/2024] docusate sodium   100 mg Oral Daily   insulin  aspart  0-9 Units Subcutaneous TID WC   isosorbide  dinitrate  20 mg Oral Daily   latanoprost   1 drop Both Eyes QHS   nutrition supplement (JUVEN)  1 packet Oral BID BM   sodium chloride  flush  3 mL Intravenous Q12H   zinc  sulfate (50mg  elemental zinc )  220 mg Oral Daily    HPI: [name] is a 68 y.o. female admitted from SNF to manage dehisced wound.   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 Seen by [facility] for another hospitaliztion in September for OM of the vertebra/sacrum and placed on daptomycin and ceftazidime  (bone culture Proteus and VRE noted in cultures as well).  MRSA and GBS bacteremia as well with 8 week course of vancomycin  through 9/22 for concern over vertebral infection too early to diagnose on MRI.   Seen outpatient and had non-healing stump wound over tibia with exposed bone.    Review of Systems: Review of Systems  Constitutional:  Negative for chills, diaphoresis, fever and malaise/fatigue.  Respiratory: Negative.    Cardiovascular:  Negative for chest pain.  Gastrointestinal: Negative.   Genitourinary: Negative.   Musculoskeletal:  Negative  for back pain and joint pain.    Past Medical History:  Diagnosis Date   Anemia    Arthritis    Atrial fibrillation (HCC)    CAD (coronary artery disease)    Chronic kidney disease, stage III (moderate) (HCC)    Constipation    Diabetes mellitus (HCC)    ESRD on dialysis (HCC) 04/20/2024   History of blood transfusion    HLD (hyperlipidemia)    Hypertension    Myocardial infarction [facility])    in April 2014   Septic shock (HCC) 04/19/2024   Wound infection after surgery 03/17/2013    Social History   Tobacco Use   Smoking status: Never   Smokeless tobacco: Never  Vaping Use    Vaping status: Never Used  Substance Use Topics   Alcohol  use: No   Drug use: No    Family History  Problem Relation Age of Onset   Diabetes Mother        No history CAD   Colon cancer Mother 77       died at 12 from CRC   Hypertension Father        Also had CAD   Heart disease Father    Cervical cancer Sister    Diabetes Sister    Congestive Heart Failure Sister    Heart disease Maternal Grandmother    Diabetes Brother    Kidney disease Brother    Sickle cell anemia Son    Thyroid  disease Son    Breast cancer Neg Hx    BRCA 1/2 Neg Hx    Allergies  Allergen Reactions   Nsaids Other (See Comments)    CKD stage 4   Lisinopril Other (See Comments)    coughing   Peanut-Containing Drug Products Itching and Other (See Comments)    GI intolerance - diarrhea    OBJECTIVE: Blood pressure (!) 104/55, pulse 66, temperature 98.6 F (37 C), resp. rate 16, height 5' 7 (1.702 m), weight 62.1 kg, SpO2 100%.  Physical Exam  Lab Results Lab Results  Component Value Date   WBC 8.0 06/24/2024   HGB 8.5 (L) 06/24/2024   HCT 27.9 (L) 06/24/2024   MCV 87.5 06/24/2024   PLT 128 (L) 06/24/2024    Lab Results  Component Value Date   CREATININE 6.23 (H) 06/24/2024   BUN 32 (H) 06/24/2024   NA 134 (L) 06/24/2024   K 4.1 06/24/2024   CL 103 06/24/2024   CO2 19 (L) 06/24/2024    Lab Results  Component Value Date   ALT 12 06/24/2024   AST 15 06/24/2024   ALKPHOS 64 06/24/2024   BILITOT 0.6 06/24/2024     Microbiology: Recent Results (from the past 240 hours)  Aerobic/Anaerobic Culture w Gram Stain (surgical/deep wound)     Status: None (Preliminary result)   Collection Time: 06/24/24 10:14 AM   Specimen: Soft Tissue, Other  Result Value Ref Range Status   Specimen Description TISSUE  Final   Special Requests RIGHT KNEE  Final   Gram Stain   Final    NO WBC SEEN NO ORGANISMS SEEN Performed at [facility], [address], [place], [place] [address]    Culture  PENDING  Incomplete   Report Status PENDING  Incomplete    Corean Fireman, MSN, NP-C [facility] Pager: [phone]  06/24/2024 3:58 PM    Total Encounter Time: 25 m

## 2024-06-24 NOTE — Progress Notes (Addendum)
 [facility]  Reviewed previous plan of care   #Stage IV sacral decubitus ulcer #History of MRSA/strep B Bacteremia #VRE -s/p bedside debridement on 9/5 -Albumin  1.5. RD following to ensure proper nutrition to optimize wound healing.   -Blood cultures negative and finalized -Continue dilaudid  q6 PRN and scheduled tylenol .   -Continue wound care -q2 turns with air mattress for pressure offloading - Bone culture obtained from wound at time of debridement positive for Proteus mirabilis and VRE: Enterococcus faecium. Antibiotics adjusted to linezolid  for the a 6-week duration.  Continue ceftazidime  for Proteus mirabilis coverage through 10/16 with dialysis.  -As patient is on a long duration of linezolid  she will need weekly CBCs to monitor for myelosuppression. -Contact precautions in place.  Afib on Eliquis  will hold until < 50 cc bloody drainage in Vac and follow CBC  Plan to consult ID for follow up plan of care since last discharge 06/08/24  Maurilio Deland Collet PA-C

## 2024-06-25 ENCOUNTER — Encounter (HOSPITAL_COMMUNITY): Payer: Self-pay | Admitting: Orthopedic Surgery

## 2024-06-25 LAB — BASIC METABOLIC PANEL WITH GFR
Anion gap: 12 (ref 5–15)
BUN: 37 mg/dL — ABNORMAL HIGH (ref 8–23)
CO2: 19 mmol/L — ABNORMAL LOW (ref 22–32)
Calcium: 8.5 mg/dL — ABNORMAL LOW (ref 8.9–10.3)
Chloride: 103 mmol/L (ref 98–111)
Creatinine, Ser: 7 mg/dL — ABNORMAL HIGH (ref 0.44–1.00)
GFR, Estimated: 6 mL/min — ABNORMAL LOW (ref >60)
Glucose, Bld: 117 mg/dL — ABNORMAL HIGH (ref 70–99)
Potassium: 4.5 mmol/L (ref 3.5–5.1)
Sodium: 134 mmol/L — ABNORMAL LOW (ref 135–145)

## 2024-06-25 LAB — CBC
HCT: 23.1 % — ABNORMAL LOW (ref 36.0–46.0)
Hemoglobin: 7.2 g/dL — ABNORMAL LOW (ref 12.0–15.0)
MCH: 26.4 pg (ref 26.0–34.0)
MCHC: 31.2 g/dL (ref 30.0–36.0)
MCV: 84.6 fL (ref 80.0–100.0)
Platelets: 125 K/uL — ABNORMAL LOW (ref 150–400)
RBC: 2.73 MIL/uL — ABNORMAL LOW (ref 3.87–5.11)
RDW: 21.4 % — ABNORMAL HIGH (ref 11.5–15.5)
WBC: 8.8 K/uL (ref 4.0–10.5)
nRBC: 0.3 % — ABNORMAL HIGH (ref 0.0–0.2)

## 2024-06-25 LAB — GLUCOSE, CAPILLARY
Glucose-Capillary: 98 mg/dL (ref 70–99)
Glucose-Capillary: 170 mg/dL — ABNORMAL HIGH (ref 70–99)
Glucose-Capillary: 110 mg/dL — ABNORMAL HIGH (ref 70–99)

## 2024-06-25 MED ORDER — DEXTROSE 5 % IV SOLN
0.5000 g | Freq: Once | INTRAVENOUS | Status: AC
  Administered 2024-06-25: 0.5 g via INTRAVENOUS
  Filled 2024-06-25: qty 0.5

## 2024-06-25 MED ORDER — DARBEPOETIN ALFA 150 MCG/0.3ML IJ SOSY
150.0000 ug | PREFILLED_SYRINGE | Freq: Once | INTRAMUSCULAR | Status: AC
  Administered 2024-06-25: 150 ug via SUBCUTANEOUS
  Filled 2024-06-25: qty 0.3

## 2024-06-25 NOTE — Evaluation (Signed)
 Occupational Therapy Evaluation and Discharge Patient Details Name: [name] MRN: [id number] DOB: [birth year] Today's Date: 06/25/2024   History of Present Illness   68 y.o. female presents to [facility] hospital on 06/24/24 for revision of R non healing BKA.PMHx: R BKA  03/2024, dehiscense 05/2024, ESRD on HD, colostomy, T2DM, Afib, Rt ankle ORIF, CAD.     Clinical Impressions Pt lives with her 3 sons. She can typically manage her colostomy and bathe and dress herself with set up. She squat pivots in and out of her w/c mod I. Pt's son assists with IADLs and sacral wound care. Pt presents with post operative and sacral pain. She was able to transfer x 2 with light min assist, likely due to fatigue following earlier HD. Pt is close to her baseline in ADLs. Recommend resuming HHOT upon discharge and ADLs with nursing staff.      If plan is discharge home, recommend the following:   A little help with walking and/or transfers;A little help with bathing/dressing/bathroom;Assistance with cooking/housework;Assist for transportation;Help with stairs or ramp for entrance     Functional Status Assessment   Patient has had a recent decline in their functional status and demonstrates the ability to make significant improvements in function in a reasonable and predictable amount of time.     Equipment Recommendations   None recommended by OT     Recommendations for Other Services         Precautions/Restrictions   Precautions Precautions: Fall Recall of Precautions/Restrictions: Intact Precaution/Restrictions Comments: R LE wound vac, sacral wound, colostomy Restrictions Weight Bearing Restrictions Per Provider Order: Yes RLE Weight Bearing Per Provider Order: Non weight bearing     Mobility Bed Mobility Overal bed mobility: Modified Independent                  Transfers Overall transfer level: Needs assistance Equipment used: None Transfers: Sit to/from Stand, Bed  to chair/wheelchair/BSC Sit to Stand: Min assist Stand pivot transfers: Min assist         General transfer comment: steadying assist with pt guiding OT in how to set up w/c and assist her      Balance Overall balance assessment: Needs assistance   Sitting balance-Leahy Scale: Good                                     ADL either performed or assessed with clinical judgement   ADL Overall ADL's : Needs assistance/impaired Eating/Feeding: Independent   Grooming: Set up;Sitting   Upper Body Bathing: Set up;Sitting   Lower Body Bathing: Set up;Sitting/lateral leans   Upper Body Dressing : Set up;Sitting   Lower Body Dressing: Set up;Sitting/lateral leans                       Vision Baseline Vision/History: 2 Legally blind Patient Visual Report: No change from baseline Additional Comments: uses magnifying glass, can't see details     Perception         Praxis         Pertinent Vitals/Pain Pain Assessment Pain Assessment: 0-10 Pain Score: 8  Pain Location: bottom, residual limb Pain Descriptors / Indicators: Sore Pain Intervention(s): Monitored during session, Premedicated before session     Extremity/Trunk Assessment             Communication Communication Communication: No apparent difficulties   Cognition Arousal: Alert Behavior During Therapy:  WFL for tasks assessed/performed Cognition: No apparent impairments                               Following commands: Intact       Cueing  General Comments   Cueing Techniques: Verbal cues      Exercises     Shoulder Instructions      Home Living Family/patient expects to be discharged to:: Private residence Living Arrangements: Children (3 sons) Available Help at Discharge: Family;Available 24 hours/day Type of Home: House Home Access: Level entry     Home Layout: Multi-level Alternate Level Stairs-Number of Steps: 5 Alternate Level Stairs-Rails:  Left Bathroom Shower/Tub: Producer, television/film/video: Standard Bathroom Accessibility: No   Home Equipment: Cane - single point;Wheelchair - Forensic psychologist (2 wheels);Shower seat;BSC/3in1;Lift chair;Hospital bed          Prior Functioning/Environment Prior Level of Function : Needs assist             Mobility Comments: squat pivots independently, using w/c for mobility ADLs Comments: set up for ADLs, uses BSC, sons do wound care and IADLs    OT Problem List: Decreased strength;Pain   OT Treatment/Interventions:        OT Goals(Current goals can be found in the care plan section)       OT Frequency:       Co-evaluation              AM-PAC OT 6 Clicks Daily Activity     Outcome Measure Help from another person eating meals?: None Help from another person taking care of personal grooming?: A Little Help from another person toileting, which includes using toliet, bedpan, or urinal?: A Little Help from another person bathing (including washing, rinsing, drying)?: A Little Help from another person to put on and taking off regular upper body clothing?: A Little Help from another person to put on and taking off regular lower body clothing?: A Little 6 Click Score: 19   End of Session Equipment Utilized During Treatment: Gait belt  Activity Tolerance: Patient limited by fatigue (post HD) Patient left: in bed;with call bell/phone within reach  OT Visit Diagnosis: Muscle weakness (generalized) (M62.81)                Time: 8568-8494 OT Time Calculation (min): 34 min Charges:  OT General Charges $OT Visit: 1 Visit OT Evaluation $OT Eval Moderate Complexity: 1 Mod OT Treatments $Self Care/Home Management : 8-22 mins  Mliss HERO, OTR/L Acute Rehabilitation Services Office: [phone]   [name] 06/25/2024, 3:14 PM

## 2024-06-25 NOTE — Progress Notes (Signed)
 Patient ID: [name], female   DOB: [birth year], 68 y.o.   MRN: [id number] Patient is postoperative day 1 revision transtibial amputation.  Cultures are pending.  Anticipate discharge on Friday with a Prevena plus portable wound VAC pump.

## 2024-06-25 NOTE — Progress Notes (Signed)
 Pharmacy Antibiotic Note  [name] is a 68 y.o. female admitted on 06/24/2024 with BKA dehiscense s/p I&D 10/1 with cultures sent. 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 Pharmacy has been consulted to transition linezolid  to daptomycin this admission and continue ceftazidime .   ESRD-MWF, last outpatient on 9/29. Patient is off schedule and received 3 hours of HD today (06/25/24). Per nephrology note, tentative plans to receive HD tomorrow (06/26/24).   Plan: - Daptomycin 600 mg (~10 mg/kg) every 48 hours - Monitor weekly CK  - Continue Ceftazidime  1g/MWF - will order ceftazidime  500 mg x1 dose today s/p HD session  - Will continue to follow HD schedule/duration, culture results, LOT (tentative plans for 07/09/2024 per ID), and antibiotic de-escalation plans   Height: 5' 7 (170.2 cm) Weight: 60.5 kg (133 lb 6.1 oz) IBW/kg (Calculated) : 61.6  Temp (24hrs), Avg:98.2 F (36.8 C), Min:98 F (36.7 C), Max:98.7 F (37.1 C)  Recent Labs  Lab 06/24/24 0948 06/24/24 0951 06/25/24 0421  WBC  --  8.0 8.8  CREATININE 6.60* 6.23* 7.00*    Estimated Creatinine Clearance: 7.3 mL/min (A) (by C-G formula based on SCr of 7 mg/dL (H)).    Allergies  Allergen Reactions   Nsaids Other (See Comments)    CKD stage 4   Lisinopril Other (See Comments)    coughing   Peanut-Containing Drug Products Itching and Other (See Comments)    GI intolerance - diarrhea    Antimicrobials this admission: Linezolid /Ceftazidime  PTA Cefazolin  pre-op  10/1 x 1 Ceftazidime  10/1 >> Daptomycin 10/1 >>  Microbiology results: 9/5 sacral bone cx >> proteus mirabilis + VRE 10/1 R-knee tissue cx >>  Thank you for allowing pharmacy to be a part of this patient's care.  Feliciano Close, PharmD PGY2 Infectious Diseases Pharmacy Resident  06/25/2024 2:35 PM

## 2024-06-25 NOTE — Progress Notes (Signed)
   06/25/24 1028  Vitals  Temp 98.1 F (36.7 C)  Pulse Rate 80  Resp 16  BP 121/66  SpO2 100 %  O2 Device Room Air  Weight 60.5 kg  Type of Weight Post-Dialysis  Oxygen Therapy  Pulse Oximetry Type Continuous  Oximetry Probe Site Changed No  Post Treatment  Dialyzer Clearance Lightly streaked  Hemodialysis Intake (mL) 0 mL  Liters Processed 54.1  Fluid Removed (mL) 1500 mL  Tolerated HD Treatment Yes   Received patient in bed to unit.  Alert and oriented.  Informed consent signed and in chart.   TX duration:3HRS  Patient tolerated well.  Transported back to the room  Alert, without acute distress.  Hand-off given to patient's nurse.   Access used: [facility] Access issues: NONE  Total UF removed: 1.5L Medication(s) given: NONE    Na'Shaminy T [facility]ol[facility]

## 2024-06-25 NOTE — Progress Notes (Signed)
 PT Cancellation Note  Patient Details Name: [name] MRN: [id number] DOB: May 16, 1956   Cancelled Treatment:    Reason Eval/Treat Not Completed: Patient at procedure or test/unavailable. Pt at HD. PT will return as able to complete PT eval.  Norene Ames, PT, DPT Acute Rehabilitation Services Secure chat preferred Office #: [phone]    Norene CHRISTELLA Ames 06/25/2024, 7:39 AM

## 2024-06-25 NOTE — Progress Notes (Signed)
 OT Cancellation Note  Patient Details Name: [name] MRN: [id number] DOB: 08/01/1956   Cancelled Treatment:    Reason Eval/Treat Not Completed: Patient at procedure or test/ unavailable (Pt currently in HD)  [name] 06/25/2024, 7:55 AM Mliss HERO, OTR/L Acute Rehabilitation Services Office: [phone]

## 2024-06-25 NOTE — Progress Notes (Signed)
 Inpatient Rehab Admissions Coordinator:   Therapy evaluations complete and pt near baseline with recs for d/c with HH.  I will sign off for CIR.   Reche Lowers, PT, DPT Admissions Coordinator [phone] 06/25/24  3:48 PM

## 2024-06-25 NOTE — Progress Notes (Signed)
 [facility] Progress Note   Subjective:  Seen in [facility]. On dialysis  Tolerating HD. No complaints. No cp, sob She just wants to know what time she is going home tomorrow.   Objective Vitals:   06/25/24 0710 06/25/24 0730 06/25/24 0800 06/25/24 0830  BP: (!) 106/50 (!) 122/59 (!) 119/59 (!) 106/56  Pulse: 79 74 74 74  Resp: 13 11 16  (!) 22  Temp: 98.7 F (37.1 C)     TempSrc: Oral     SpO2: 100% 100% 100% 100%  Weight: 62.1 kg     Height:          Additional Objective Labs: Basic Metabolic Panel: Recent Labs  Lab 06/24/24 0948 06/24/24 0951 06/25/24 0421  NA 132* 134* 134*  K 5.7* 4.1 4.5  CL 106 103 103  CO2  --  19* 19*  GLUCOSE 120* 122* 117*  BUN 46* 32* 37*  CREATININE 6.60* 6.23* 7.00*  CALCIUM   --  8.7* 8.5*   CBC: Recent Labs  Lab 06/24/24 0948 06/24/24 0951 06/25/24 0421  WBC  --  8.0 8.8  NEUTROABS  --  5.3  --   HGB 9.9* 8.5* 7.2*  HCT 29.0* 27.9* 23.1*  MCV  --  87.5 84.6  PLT  --  128* 125*   Blood Culture    Component Value Date/Time   SDES TISSUE 06/24/2024 1014   SPECREQUEST RIGHT KNEE 06/24/2024 1014   CULT  06/24/2024 1014    CULTURE REINCUBATED FOR BETTER GROWTH Performed at [facility], [address], [place], [place] [address]    REPTSTATUS PENDING 06/24/2024 1014     Physical Exam General: Alert, nad Heart: RRR Resp: Clear, normal wob Abdomen: non-tender Extremities: R BKA; no edema Dialysis Access: TDC in use   Medications:  sodium chloride  Stopped (06/25/24 0555)   cefTAZidime  (FORTAZ ) 1 g in sodium chloride  0.9 % 100 mL IVPB Stopped (06/25/24 0000)   DAPTOmycin Stopped (06/25/24 0230)    acetaminophen   1,000 mg Oral Q6H   brimonidine   1 drop Both Eyes BID   carvedilol   25 mg Oral BID WC   Chlorhexidine  Gluconate Cloth  6 each Topical Q0600   Chlorhexidine  Gluconate Cloth  6 each Topical Q0600   collagenase    Topical Daily   docusate sodium   100 mg Oral Daily   insulin  aspart  0-9 Units  Subcutaneous TID WC   isosorbide  dinitrate  20 mg Oral Daily   latanoprost   1 drop Both Eyes QHS   nutrition supplement (JUVEN)  1 packet Oral BID BM   sodium chloride  flush  3 mL Intravenous Q12H   zinc  sulfate (50mg  elemental zinc )  220 mg Oral Daily    Dialysis Orders:  [facility] M[facility] 4h  B350   62.8kg  3K bath  TDC   Heparin  2500 Last OP HD 9/29, post wt 62.3kg Getting to dry wt Small wt gains 0.5 -1.5kg usually, occ 2-3kg Mircera 150 mcg q 2 wks, last 9/19, due 10/3 on Friday  Assessment/Plan:  R BKA wound s/p revision. Wound cultures pending. Per ortho H/o  sacral decub ulcer w osteomyelitis. Bone Cx + Proteus + VRE. ID consulted for abx - recommend daptomycin + ceftazidime  through 10/16.  ESRD HD MWF. Dialysis off schedule today. Next HD Friday if she agrees.  HTN/volume- BP/volume ok. Below dry weight now  Anemia. Hgb dropped post op. Due for ESA tomorrow - will order  MBD. Continue home meds.   Maisie Ronnald Acosta  PA-C [facility] 06/25/2024,9:00 AM

## 2024-06-26 DIAGNOSIS — L89159 Pressure ulcer of sacral region, unspecified stage: Secondary | ICD-10-CM

## 2024-06-26 DIAGNOSIS — Z22359 Carrier of enterobacterales, unspecified: Secondary | ICD-10-CM

## 2024-06-26 LAB — BASIC METABOLIC PANEL WITH GFR
Anion gap: 10 (ref 5–15)
BUN: 22 mg/dL (ref 8–23)
CO2: 23 mmol/L (ref 22–32)
Calcium: 8.3 mg/dL — ABNORMAL LOW (ref 8.9–10.3)
Chloride: 99 mmol/L (ref 98–111)
Creatinine, Ser: 4.88 mg/dL — ABNORMAL HIGH (ref 0.44–1.00)
GFR, Estimated: 9 mL/min — ABNORMAL LOW (ref >60)
Glucose, Bld: 84 mg/dL (ref 70–99)
Potassium: 3.7 mmol/L (ref 3.5–5.1)
Sodium: 132 mmol/L — ABNORMAL LOW (ref 135–145)

## 2024-06-26 LAB — CBC
HCT: 21.1 % — ABNORMAL LOW (ref 36.0–46.0)
Hemoglobin: 6.6 g/dL — CL (ref 12.0–15.0)
MCH: 26.3 pg (ref 26.0–34.0)
MCHC: 31.3 g/dL (ref 30.0–36.0)
MCV: 84.1 fL (ref 80.0–100.0)
Platelets: 145 K/uL — ABNORMAL LOW (ref 150–400)
RBC: 2.51 MIL/uL — ABNORMAL LOW (ref 3.87–5.11)
RDW: 21.3 % — ABNORMAL HIGH (ref 11.5–15.5)
WBC: 8.1 K/uL (ref 4.0–10.5)
nRBC: 0.4 % — ABNORMAL HIGH (ref 0.0–0.2)

## 2024-06-26 LAB — GLUCOSE, CAPILLARY
Glucose-Capillary: 70 mg/dL (ref 70–99)
Glucose-Capillary: 123 mg/dL — ABNORMAL HIGH (ref 70–99)
Glucose-Capillary: 94 mg/dL (ref 70–99)
Glucose-Capillary: 103 mg/dL — ABNORMAL HIGH (ref 70–99)
Glucose-Capillary: 113 mg/dL — ABNORMAL HIGH (ref 70–99)

## 2024-06-26 LAB — PREPARE RBC (CROSSMATCH)

## 2024-06-26 MED ORDER — SODIUM CHLORIDE 0.9% IV SOLUTION: Freq: Once | INTRAVENOUS | Status: DC

## 2024-06-26 MED ORDER — ALTEPLASE 2 MG IJ SOLR: 2.0000 mg | Freq: Once | INTRAMUSCULAR | Status: DC | PRN

## 2024-06-26 MED ORDER — PENTAFLUOROPROP-TETRAFLUOROETH EX AERO: 1.0000 | INHALATION_SPRAY | CUTANEOUS | Status: DC | PRN

## 2024-06-26 MED ORDER — ANTICOAGULANT SODIUM CITRATE 4% (200MG/5ML) IV SOLN: 5.0000 mL | Status: DC | PRN

## 2024-06-26 MED ORDER — LIDOCAINE HCL (PF) 1 % IJ SOLN: 5.0000 mL | INTRAMUSCULAR | Status: DC | PRN

## 2024-06-26 MED ORDER — NEPRO/CARBSTEADY PO LIQD: 237.0000 mL | ORAL | Status: DC | PRN

## 2024-06-26 MED ORDER — HEPARIN SODIUM (PORCINE) 1000 UNIT/ML DIALYSIS: 1000.0000 [IU] | INTRAMUSCULAR | Status: DC | PRN

## 2024-06-26 MED ORDER — HEPARIN SODIUM (PORCINE) 1000 UNIT/ML DIALYSIS
2000.0000 [IU] | Freq: Once | INTRAMUSCULAR | Status: DC
Start: 2024-06-26 — End: 2024-06-26

## 2024-06-26 MED ORDER — LIDOCAINE-PRILOCAINE 2.5-2.5 % EX CREA: 1.0000 | TOPICAL_CREAM | CUTANEOUS | Status: DC | PRN

## 2024-06-26 MED ORDER — HEPARIN SODIUM (PORCINE) 1000 UNIT/ML IJ SOLN
INTRAMUSCULAR | Status: AC
  Filled 2024-06-26: qty 4

## 2024-06-26 MED ORDER — SODIUM CHLORIDE 0.9 % IV SOLN
2.0000 g | INTRAVENOUS | Status: DC
  Administered 2024-06-26 – 2024-06-29 (×2): 2 g via INTRAVENOUS
  Filled 2024-06-26 (×3): qty 12.5

## 2024-06-26 NOTE — Progress Notes (Signed)
 Spoke to Consulting civil engineer at [facility]. Clinic can provide iv daptomycin and iv cefepime  at d/c. Renal PA will send orders to clinic at d/c for iv abx needs once recs finalized. Clinic is open on Saturday and can be reached directly for any questions/needs. This info provided to ID PA and renal PA. Will assist as needed.   Randine Mungo Dialysis Navigator [phone]

## 2024-06-26 NOTE — Progress Notes (Addendum)
 [facility]     POD#2 revision of right BKA wound dehiscence   ID follow up consult: Of note, the patient was recently seen and is also being treated for a sacral decubitus with osteomyelitis and VRE and Proteus on culture. The patient was placed on Linezolid  and Ceftazidime . Unfortunately, she is now thrombocytopenic so her Linezolid  will be changed to Daptomycin. Recommend continuing Daptomycin and Ceftazidime  until October 16th. ID will follow up on her intra operative cultures from today and make any antibiotic changes if needed   Prevena plus portable wound VAC pump  No PT follow up recommended at this time patient is back to baseline.  HGB 6.6 dropped from 7.2 yesterday.  Plan for repeat HD today.  I spoke with Dr. Geralynn and plan is for HD today, transfusion per Nephrologist and D/C home tomorrow.    Vac with good suction SS drainage 25 cc total  F/U in our office in 1 week.   I contacted Pharmacy so they can help getting the IV antibiotic orders prepared to make the discharge easier for tomorrow.    Maurilio Deland Collet PA-C

## 2024-06-26 NOTE — Progress Notes (Signed)
 [facility]  Date of Admission:  06/24/2024      Total days of antibiotics 2   Cefepime  10/03  Linezolid           ASSESSMENT: [name] is a 68 y.o. female admitted with     Wound Dehiscence of Rt BKA -  Exposed bone -  No purulence or necrotic tissue but inflammed appearing. Cultures are growing unidentified enterobacter species that given amp-c producer will do better with with cefepime  likely.  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    H/O MRSA Bacteremia with possible vertebral infection -  Conservatively ongoing coverage with now daptomycin. Should be well treated now.  9/05 ESR 17, CRP 4.1 --> will repeat    ESRD -  Will try to get dialysis friendly regimen for her - there is a chance we may need carbapenem not sure her center can get ertapenem. Will begin conversations with HD coordinator  Dr. Epifanio is available over the weekend for ID related questions and will be finalizing recommendations    PLAN: Change ceftaz to cefepime   Continue Daptomycin  CK monitoring Will reach out to nephro team re: coordination of outpatient antibiotics on the weekend.    Principal Problem:   Non-healing wound of amputation stump (HCC)    sodium chloride    Intravenous Once   brimonidine   1 drop Both Eyes BID   carvedilol   25 mg Oral BID WC   Chlorhexidine  Gluconate Cloth  6 each Topical Q0600   Chlorhexidine  Gluconate Cloth  6 each Topical Q0600   collagenase    Topical Daily   docusate sodium   100 mg Oral Daily   insulin  aspart  0-9 Units Subcutaneous TID WC   isosorbide  dinitrate  20 mg Oral Daily   latanoprost   1 drop Both Eyes QHS   nutrition supplement (JUVEN)  1 packet Oral BID BM   sodium  chloride flush  3 mL Intravenous Q12H   zinc  sulfate (50mg  elemental zinc )  220 mg Oral Daily    SUBJECTIVE: Cold and needs blood today. Otherwise feels well.   Review of Systems: Review of Systems  Constitutional:  Negative for fever.  Respiratory:  Negative for cough and shortness of breath.   Cardiovascular:  Negative for chest pain and palpitations.  Gastrointestinal:  Negative for abdominal pain, nausea and vomiting.  Genitourinary:  Negative for dysuria.  Neurological:  Negative for dizziness.    Allergies  Allergen Reactions   Nsaids Other (See Comments)    CKD stage 4   Lisinopril Other (See Comments)    coughing   Peanut-Containing Drug Products Itching and Other (See Comments)    GI intolerance - diarrhea    OBJECTIVE: Vitals:   06/26/24 0825 06/26/24 1118 06/26/24 1346 06/26/24 1354  BP: (!) 82/48 (!) 117/54 (!) 116/55 (!) 106/52  Pulse: 76 75 74 72  Resp: 16 16 16  (!) 71  Temp: 97.7 F (36.5 C) 97.6 F (36.4 C) 97.9 F (36.6 C)   TempSrc: Oral Oral    SpO2: 100% 100% 100% 100%  Weight:      Height:       Body mass index is 20.89 kg/m.  Physical Exam Constitutional:  Appearance: Normal appearance. She is not ill-appearing.  HENT:     Mouth/Throat:     Mouth: Mucous membranes are moist.     Pharynx: Oropharynx is clear.  Eyes:     General: No scleral icterus. Cardiovascular:     Rate and Rhythm: Normal rate and regular rhythm.  Pulmonary:     Effort: Pulmonary effort is normal.  Neurological:     Mental Status: She is oriented to person, place, and time.  Psychiatric:        Mood and Affect: Mood normal.        Thought Content: Thought content normal.     Lab Results Lab Results  Component Value Date   WBC 8.1 06/26/2024   HGB 6.6 (LL) 06/26/2024   HCT 21.1 (L) 06/26/2024   MCV 84.1 06/26/2024   PLT 145 (L) 06/26/2024    Lab Results  Component Value Date   CREATININE 4.88 (H) 06/26/2024   BUN 22 06/26/2024   NA 132 (L)  06/26/2024   K 3.7 06/26/2024   CL 99 06/26/2024   CO2 23 06/26/2024    Lab Results  Component Value Date   ALT 12 06/24/2024   AST 15 06/24/2024   ALKPHOS 64 06/24/2024   BILITOT 0.6 06/24/2024     Microbiology: Recent Results (from the past 240 hours)  Aerobic/Anaerobic Culture w Gram Stain (surgical/deep wound)     Status: None (Preliminary result)   Collection Time: 06/24/24 10:14 AM   Specimen: Soft Tissue, Other  Result Value Ref Range Status   Specimen Description TISSUE  Final   Special Requests RIGHT KNEE  Final   Gram Stain   Final    NO WBC SEEN NO ORGANISMS SEEN Performed at [facility], [address], [place], [place] [address]    Culture   Final    RARE ENTEROBACTER SPECIES IDENTIFICATION AND SUSCEPTIBILITIES TO FOLLOW NO ANAEROBES ISOLATED; CULTURE IN PROGRESS FOR 5 DAYS    Report Status PENDING  Incomplete    Corean Fireman, MSN, NP-C [facility] Pager: [phone]  06/26/2024  2:25 PM   Total Encounter Time: 15 m

## 2024-06-26 NOTE — Evaluation (Signed)
 Physical Therapy Brief Evaluation and Discharge Note Patient Details Name: [name] MRN: [id number] DOB: July 24, 1956 Today's Date: 06/26/2024   History of Present Illness  68 y.o. female presents to [facility] hospital on 06/24/24 for revision of R non healing BKA.PMHx: R BKA  03/2024, dehiscense 05/2024, ESRD on HD, colostomy, T2DM, Afib, Rt ankle ORIF, CAD.  Clinical Impression  Pt presents with admitting diagnosis above. Pt today was able to squat pivot transfer into WC Mod I. PTA pt reports that she was working with HHPT and able to transfer herself into her WC Mod I. Pt presents at or near baseline mobility. Pt has no further acute PT needs and will be signing off. Defer further PT needs to HHPT. Pt anticipates DC later today. Re consult PT if mobility status changes.        PT Assessment All further PT needs can be met in the next venue of care  Assistance Needed at Discharge  PRN    Equipment Recommendations None recommended by PT  Recommendations for Other Services       Precautions/Restrictions Precautions Precautions: Fall Recall of Precautions/Restrictions: Intact Precaution/Restrictions Comments: R LE wound vac, sacral wound, colostomy Restrictions Weight Bearing Restrictions Per Provider Order: Yes RLE Weight Bearing Per Provider Order: Non weight bearing        Mobility  Bed Mobility   Supine/Sidelying to sit: Min assist   General bed mobility comments: Light Min A for trunk elevation. Likely due to fatigue.  Transfers Overall transfer level: Needs assistance Equipment used: None Transfers: Bed to chair/wheelchair/BSC       Squat pivot transfers: Modified independent (Device/Increase time)     General transfer comment: Mod I for squat pivot transfers    Ambulation/Gait           General Gait Details: unable  Home Activity Instructions    Stairs            Modified Rankin (Stroke Patients Only)        Balance     Sitting  balance-Leahy Scale: Good                    Pertinent Vitals/Pain PT - Brief Vital Signs All Vital Signs Stable: Yes Pain Assessment Pain Assessment: No/denies pain     Home Living Family/patient expects to be discharged to:: Private residence Living Arrangements: Children (3 sons) Available Help at Discharge: Family;Available 24 hours/day Home Environment: Level entry;Stairs in home  Stairs-Number of Steps: 5 Home Equipment: Cane - single point;Wheelchair - Forensic psychologist (2 wheels);Shower seat;BSC/3in1;Lift chair;Hospital bed        Prior Function Level of Independence: Independent with assistive device(s) Comments: Mod I for WC transfers    UE/LE Assessment   UE ROM/Strength/Tone/Coordination: WFL    LE ROM/Strength/Tone/Coordination: Impaired LE ROM/Strength/Tone/Coordination Deficits: R BKA    Communication   Communication Communication: No apparent difficulties     Cognition Overall Cognitive Status: Appears within functional limits for tasks assessed/performed       General Comments General comments (skin integrity, edema, etc.): VSS    Exercises     Assessment/Plan    PT Problem List Decreased strength;Decreased activity tolerance;Decreased balance;Decreased mobility;Decreased knowledge of use of DME;Pain       PT Visit Diagnosis Other abnormalities of gait and mobility (R26.89);Muscle weakness (generalized) (M62.81)    No Skilled PT     Co-evaluation                AMPAC 6 Clicks Help  needed turning from your back to your side while in a flat bed without using bedrails?: A Little Help needed moving from lying on your back to sitting on the side of a flat bed without using bedrails?: A Little Help needed moving to and from a bed to a chair (including a wheelchair)?: A Little Help needed standing up from a chair using your arms (e.g., wheelchair or bedside chair)?: A Little Help needed to walk in hospital room?: Total Help  needed climbing 3-5 steps with a railing? : Total 6 Click Score: 14      End of Session   Activity Tolerance: Patient tolerated treatment well Patient left: Other (comment);with call bell/phone within reach (Up in [facility]) Nurse Communication: Mobility status;Other (comment) (Pt left in WC) PT Visit Diagnosis: Other abnormalities of gait and mobility (R26.89);Muscle weakness (generalized) (M62.81)     Time: 9154-9144 PT Time Calculation (min) (ACUTE ONLY): 10 min  Charges:   PT Evaluation $PT Eval Low Complexity: 1 Low      Adrieana Fennelly B, PT, DPT Acute Rehab Services [phone]   Sueellen Buddle  06/26/2024, 9:04 AM

## 2024-06-26 NOTE — Discharge Instructions (Signed)
 Keep Prevena plus portable wound VAC pump dressing clean and dry.  F/U in 1 week with Dr. Crist office

## 2024-06-26 NOTE — Progress Notes (Signed)
  [facility] Progress Note   Subjective:  Had dialysis yesterday off schedule Seen in room. Doesn't feel good today - weak, dizzy  Hgb 6.6 - plan for transfusion with HD today     Objective Vitals:   06/25/24 2017 06/26/24 0349 06/26/24 0825 06/26/24 1118  BP: (!) 101/46 (!) 112/56 (!) 82/48 (!) 117/54  Pulse: 83 78 76 75  Resp: 17 15 16 16   Temp: 98.4 F (36.9 C) 97.9 F (36.6 C) 97.7 F (36.5 C) 97.6 F (36.4 C)  TempSrc: Oral Oral Oral Oral  SpO2: 100% 100% 100% 100%  Weight:      Height:         Additional Objective Labs: Basic Metabolic Panel: Recent Labs  Lab 06/24/24 0951 06/25/24 0421 06/26/24 0359  NA 134* 134* 132*  K 4.1 4.5 3.7  CL 103 103 99  CO2 19* 19* 23  GLUCOSE 122* 117* 84  BUN 32* 37* 22  CREATININE 6.23* 7.00* 4.88*  CALCIUM  8.7* 8.5* 8.3*   CBC: Recent Labs  Lab 06/24/24 0951 06/25/24 0421 06/26/24 0359  WBC 8.0 8.8 8.1  NEUTROABS 5.3  --   --   HGB 8.5* 7.2* 6.6*  HCT 27.9* 23.1* 21.1*  MCV 87.5 84.6 84.1  PLT 128* 125* 145*   Blood Culture    Component Value Date/Time   SDES TISSUE 06/24/2024 1014   SPECREQUEST RIGHT KNEE 06/24/2024 1014   CULT  06/24/2024 1014    RARE ENTEROBACTER SPECIES IDENTIFICATION AND SUSCEPTIBILITIES TO FOLLOW NO ANAEROBES ISOLATED; CULTURE IN PROGRESS FOR 5 DAYS    REPTSTATUS PENDING 06/24/2024 1014     Physical Exam General: Alert, nad Heart: RRR Resp: Clear, normal wob Abdomen: non-tender Extremities: R BKA; no edema Dialysis Access: TDC in use   Medications:  DAPTOmycin Stopped (06/25/24 0230)    sodium chloride    Intravenous Once   brimonidine   1 drop Both Eyes BID   carvedilol   25 mg Oral BID WC   Chlorhexidine  Gluconate Cloth  6 each Topical Q0600   Chlorhexidine  Gluconate Cloth  6 each Topical Q0600   collagenase    Topical Daily   docusate sodium   100 mg Oral Daily   insulin  aspart  0-9 Units Subcutaneous TID WC   isosorbide  dinitrate  20 mg Oral Daily    latanoprost   1 drop Both Eyes QHS   nutrition supplement (JUVEN)  1 packet Oral BID BM   sodium chloride  flush  3 mL Intravenous Q12H   zinc  sulfate (50mg  elemental zinc )  220 mg Oral Daily    Dialysis Orders:  [facility] M[facility] 4h  B350   62.8kg  3K bath  TDC   Heparin  2500 Last OP HD 9/29, post wt 62.3kg Getting to dry wt Small wt gains 0.5 -1.5kg usually, occ 2-3kg Mircera 150 mcg q 2 wks, last 9/19, due 10/3 on Friday  Assessment/Plan: R BKA wound s/p revision.  Per ortho H/o  sacral decub ulcer w osteomyelitis. Bone Cx + Proteus + VRE. ID consulted for abx - recommend daptomycin + ceftazidime  through 10/16.  ESRD HD MWF. Dialysis off schedule Thursday. HD today  HTN/volume- BP/volume ok. Below dry weight now  Anemia. Hgb dropped post op. pRBCs  ordered with HD today.  Due for ESA - received Aranesp  150 on 10/2  MBD. Ca acceptable. Continue home meds.   Maisie Ronnald Acosta PA-C [facility] 06/26/2024,1:26 PM

## 2024-06-26 NOTE — TOC Initial Note (Signed)
 Transition of Care [facility]) - Initial/Assessment Note    Patient Details  Name: [name] MRN: [id number] Date of Birth: 07/07/1956  Transition of Care [facility]) CM/SW Contact:    Rosalva Jon Bloch, RN Phone Number: 06/26/2024, 4:08 PM  Clinical Narrative:                    -  S/P REVISION AMPUTATION, BELOW THE KNEE  From home with adult sons. PTA active with [facility].  Pt without transportation issues or RX med concern.  PCP Damian Christians confirmed.  IP CM following and will assist with needs....  Expected Discharge Plan: Home w Home Health Services Barriers to Discharge: Continued Medical Work up   Patient Goals and CMS Choice     Choice offered to / list presented to : Patient      Expected Discharge Plan and Services   Discharge Planning Services: CM Consult                               HH Arranged: PT, OT, RN [facility] Agency: [facility] Date [facility] Agency Contacted: 06/26/24 Time HH Agency Contacted: [phone]    Prior Living Arrangements/Services   Lives with:: Adult Children Patient language and need for interpreter reviewed:: Yes Do you feel safe going back to the place where you live?: Yes      Need for Family Participation in Patient Care: Yes (Comment) Care giver support system in place?: Yes (comment) Current home services: DME (hospital bed , liftchair , RW) Criminal Activity/Legal Involvement Pertinent to Current Situation/Hospitalization: No - Comment as needed  Activities of Daily Living   ADL Screening (condition at time of admission) Independently performs ADLs?: Yes (appropriate for developmental age) Is the patient deaf or have difficulty hearing?: No Does the patient have difficulty seeing, even when wearing glasses/contacts?: Yes Does the patient have difficulty concentrating, remembering, or making decisions?: No  Permission Sought/Granted   Permission granted to share information with : Yes, Verbal Permission  Granted  Share Information with NAME: [name] Lavone Blades  [phone]           Emotional Assessment       Orientation: : Oriented to Self, Oriented to Place, Oriented to  Time, Oriented to Situation Alcohol  / Substance Use: Not Applicable Psych Involvement: No (comment)  Admission diagnosis:  Dehiscence of amputation stump of right lower extremity (HCC) [T87.81] Non-healing wound of amputation stump (HCC) [T87.89] 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) 05/30/2024   Hypoalbuminemia due to protein-calorie malnutrition 05/30/2024   Osteomyelitis of vertebra, sacral and sacrococcygeal region (HCC) 05/29/2024   Primary hypercoagulable state 05/29/2024   BKA stump complication (HCC) 05/29/2024   Normocytic anemia 05/29/2024   Does not feel safe at home 05/29/2024   Colostomy in place [facility]) 05/29/2024   Diabetes mellitus with kidney complication, with long-term current use of insulin  (HCC) 05/29/2024   Osteomyelitis (HCC) 04/20/2024   MRSA bacteremia 04/20/2024   Bacteremia 04/20/2024   Necrotizing fasciitis (HCC) 04/20/2024   ESRD (end stage renal disease) (HCC) 04/20/2024   Irritant contact dermatitis associated with fecal stoma 12/27/2023   Colostomy complication, unspecified (HCC) 12/27/2023   Diverticulitis of large intestine with complication 10/27/2023   Foot callus 02/27/2021  Counseled about COVID-19 virus infection 02/24/2020   Aortic heart murmur 05/15/2019   Thyroid  enlargement 07/19/2014   Vitamin D  deficiency 09/03/2013   Hyperlipidemia 04/24/2013   Diabetic retinopathy (HCC) 01/23/2013   Chronic atrial fibrillation (HCC) 01/15/2013   Aortic atherosclerosis 01/11/2013   H/O non-ST elevation myocardial infarction (NSTEMI) 01/09/2013   History of stroke 01/08/2013    Diabetic neuropathy 01/08/2013   Hypertension    Diabetes mellitus    PCP:  Campbell Reynolds, NP Pharmacy:   [facility] - [place], [place] - [address] [place] [address] Phone: [phone] Fax: [phone]  [facility] [address] [place]), [place] - [address] [place] [address] Phone: [phone] Fax: [phone]  [id number] - [place], [place] - [address] [place] [address] Phone: [phone] Fax: [phone]  [facility] [address] [place] [address] Phone: [phone] Fax: [phone]     Social Drivers of Health (SDOH) Social History: SDOH Screenings   Food Insecurity: No Food Insecurity (06/24/2024)  Housing: Unknown (06/24/2024)  Transportation Needs: No Transportation Needs (06/24/2024)  Utilities: Not At Risk (06/24/2024)  Depression (PHQ2-9): Low Risk  (02/28/2021)  Financial Resource Strain: Low Risk  (08/25/2021)   Received from [facility]  Physical Activity: Insufficiently Active (06/20/2021)   Received from [facility]  Social Connections: Moderately Isolated (06/24/2024)  Stress: No Stress Concern Present (08/25/2021)   Received from [facility]  Tobacco Use: Low Risk  (06/23/2024)   SDOH Interventions:     Readmission Risk Interventions     No data to display

## 2024-06-26 NOTE — Plan of Care (Signed)

## 2024-06-26 NOTE — Care Management Important Message (Signed)
 Important Message  Patient Details  Name: [name] MRN: [id number] Date of Birth: Jul 31, 1956   Important Message Given:  Yes - Medicare IM     Jon Cruel 06/26/2024, 3:23 PM

## 2024-06-26 NOTE — Progress Notes (Signed)
  Received patient in bed to unit.   Informed consent signed and in chart.    TX duration:3     Transported by  Hand-off given to patient's nurse.    Access used: Rt CVC Access issues: None   Total UF removed: 800 Medication(s) given: none 2 Units PRBC given Post HD VS: 117/60      Hunter Hacking LPN Kidney Dialysis Unit

## 2024-06-27 LAB — BPAM RBC
Blood Product Expiration Date: 202510092359
Blood Product Expiration Date: 202510252359
ISSUE DATE / TIME: 202510031429
ISSUE DATE / TIME: 202510031429
Unit Type and Rh: 600
Unit Type and Rh: 6200

## 2024-06-27 LAB — TYPE AND SCREEN
ABO/RH(D): A POS
Antibody Screen: NEGATIVE
Unit division: 0
Unit division: 0

## 2024-06-27 LAB — BASIC METABOLIC PANEL WITH GFR
Anion gap: 10 (ref 5–15)
BUN: 12 mg/dL (ref 8–23)
CO2: 24 mmol/L (ref 22–32)
Calcium: 8 mg/dL — ABNORMAL LOW (ref 8.9–10.3)
Chloride: 99 mmol/L (ref 98–111)
Creatinine, Ser: 3.87 mg/dL — ABNORMAL HIGH (ref 0.44–1.00)
GFR, Estimated: 12 mL/min — ABNORMAL LOW (ref >60)
Glucose, Bld: 151 mg/dL — ABNORMAL HIGH (ref 70–99)
Potassium: 3.7 mmol/L (ref 3.5–5.1)
Sodium: 133 mmol/L — ABNORMAL LOW (ref 135–145)

## 2024-06-27 LAB — CBC
HCT: 32.4 % — ABNORMAL LOW (ref 36.0–46.0)
Hemoglobin: 10.4 g/dL — ABNORMAL LOW (ref 12.0–15.0)
MCH: 27 pg (ref 26.0–34.0)
MCHC: 32.1 g/dL (ref 30.0–36.0)
MCV: 84.2 fL (ref 80.0–100.0)
Platelets: 186 K/uL (ref 150–400)
RBC: 3.85 MIL/uL — ABNORMAL LOW (ref 3.87–5.11)
RDW: 18.9 % — ABNORMAL HIGH (ref 11.5–15.5)
WBC: 10.7 K/uL — ABNORMAL HIGH (ref 4.0–10.5)
nRBC: 0.3 % — ABNORMAL HIGH (ref 0.0–0.2)

## 2024-06-27 LAB — GLUCOSE, CAPILLARY
Glucose-Capillary: 78 mg/dL (ref 70–99)
Glucose-Capillary: 181 mg/dL — ABNORMAL HIGH (ref 70–99)
Glucose-Capillary: 102 mg/dL — ABNORMAL HIGH (ref 70–99)
Glucose-Capillary: 128 mg/dL — ABNORMAL HIGH (ref 70–99)

## 2024-06-27 NOTE — Plan of Care (Signed)
   Problem: Education: Goal: Knowledge of General Education information will improve Description: Including pain rating scale, medication(s)/side effects and non-pharmacologic comfort measures Outcome: Progressing   Problem: Activity: Goal: Risk for activity intolerance will decrease Outcome: Progressing

## 2024-06-27 NOTE — Progress Notes (Signed)
 [facility] Progress Note   Subjective:  Seen and examined at bedside.  Feeling better today after blood transfusion yesterday. About to eat breakfast.  Pain is minimal, mostly on her back side.  Denies chest pain, shortness of breath, abdominal pain and n/v/d.    Objective Vitals:   06/26/24 1705 06/26/24 1757 06/26/24 1929 06/27/24 0436  BP: 117/60 (!) 99/51 (!) 119/58 (!) 93/41  Pulse: 74 73 78 71  Resp: 14 16 14 13   Temp: 98.3 F (36.8 C) (!) 97.4 F (36.3 C) 97.9 F (36.6 C) 97.9 F (36.6 C)  TempSrc:  Oral    SpO2: 100% 100% 100% 100%  Weight:      Height:       Physical Exam General:chronically ill appearing female in NAD Heart:RRR, no mrg Lungs:CTAB, nml WOB on RA Abdomen:soft, NTND Extremities:no LE edema, R BKA dressed Dialysis Access: [facility]  Filed Weights   06/24/24 0909 06/25/24 0710 06/25/24 1028  Weight: 62.1 kg 62.1 kg 60.5 kg    Intake/Output Summary (Last 24 hours) at 06/27/2024 0850 Last data filed at 06/26/2024 1614 Gross per 24 hour  Intake 730 ml  Output --  Net 730 ml    Additional Objective Labs: Basic Metabolic Panel: Recent Labs  Lab 06/24/24 0951 06/25/24 0421 06/26/24 0359  NA 134* 134* 132*  K 4.1 4.5 3.7  CL 103 103 99  CO2 19* 19* 23  GLUCOSE 122* 117* 84  BUN 32* 37* 22  CREATININE 6.23* 7.00* 4.88*  CALCIUM  8.7* 8.5* 8.3*   Liver Function Tests: Recent Labs  Lab 06/24/24 0951  AST 15  ALT 12  ALKPHOS 64  BILITOT 0.6  PROT 6.0*  ALBUMIN  2.3*    CBC: Recent Labs  Lab 06/24/24 0951 06/25/24 0421 06/26/24 0359  WBC 8.0 8.8 8.1  NEUTROABS 5.3  --   --   HGB 8.5* 7.2* 6.6*  HCT 27.9* 23.1* 21.1*  MCV 87.5 84.6 84.1  PLT 128* 125* 145*   Blood Culture    Component Value Date/Time   SDES TISSUE 06/24/2024 1014   SPECREQUEST RIGHT KNEE 06/24/2024 1014   CULT  06/24/2024 1014    RARE ENTEROBACTER SPECIES IDENTIFICATION AND SUSCEPTIBILITIES TO FOLLOW NO ANAEROBES ISOLATED; CULTURE IN PROGRESS  FOR 5 DAYS    REPTSTATUS PENDING 06/24/2024 1014    Cardiac Enzymes: Recent Labs  Lab 06/24/24 0926  CKTOTAL 25*   CBG: Recent Labs  Lab 06/26/24 0828 06/26/24 1324 06/26/24 1802 06/26/24 2103 06/27/24 0608  GLUCAP 123* 94 103* 113* 78   Medications:  ceFEPime  (MAXIPIME ) IV 2 g (06/26/24 2030)   DAPTOmycin 600 mg (06/26/24 2249)    brimonidine   1 drop Both Eyes BID   carvedilol   25 mg Oral BID WC   Chlorhexidine  Gluconate Cloth  6 each Topical Q0600   Chlorhexidine  Gluconate Cloth  6 each Topical Q0600   collagenase    Topical Daily   docusate sodium   100 mg Oral Daily   insulin  aspart  0-9 Units Subcutaneous TID WC   isosorbide  dinitrate  20 mg Oral Daily   latanoprost   1 drop Both Eyes QHS   nutrition supplement (JUVEN)  1 packet Oral BID BM   sodium chloride  flush  3 mL Intravenous Q12H   zinc  sulfate (50mg  elemental zinc )  220 mg Oral Daily    Dialysis Orders: [facility] M[facility] 4h  B350   62.8kg  3K bath  TDC   Heparin  2500 Last OP HD 9/29, post wt 62.3kg Getting  to dry wt Small wt gains 0.5 -1.5kg usually, occ 2-3kg Mircera 150 mcg q 2 wks, last 9/19, due 10/3 on Friday   Assessment/Plan: R BKA wound s/p revision.  Per ortho & ID. ABX as noted below. H/o  sacral decub ulcer w osteomyelitis. Bone Cx + Proteus + VRE. ID consulted for abx -  tentative plans for daptomycin + ceftazidime  through 10/16. Final recommendations to be provided today by ID pending cultures.  ESRD HD MWF. Next HD 06/29/24.   HTN/volume- BP/volume ok. Below dry weight now, will need to be lowered on discharge. Anemia. Hgb dropped post op. 1 unit pRBC given with HD yesterday. Repeat Hgb. Continue Aranesp  150 qThurs.  MBD. Ca acceptable. Check phos. Continue home meds.  Nutrition - Renal diet w/fluid restrictions. Alb low - protein supplements Thrombocytopenia - likely due to linezolid , ABX d/c. Labs improving.   Manuelita Labella, PA-C [facility] 06/27/2024,8:50 AM  LOS: 3 days

## 2024-06-28 LAB — GLUCOSE, CAPILLARY
Glucose-Capillary: 121 mg/dL — ABNORMAL HIGH (ref 70–99)
Glucose-Capillary: 100 mg/dL — ABNORMAL HIGH (ref 70–99)
Glucose-Capillary: 121 mg/dL — ABNORMAL HIGH (ref 70–99)
Glucose-Capillary: 152 mg/dL — ABNORMAL HIGH (ref 70–99)
Glucose-Capillary: 119 mg/dL — ABNORMAL HIGH (ref 70–99)

## 2024-06-28 LAB — HEPATITIS B SURFACE ANTIGEN: Hepatitis B Surface Ag: NONREACTIVE

## 2024-06-28 MED ORDER — CHLORHEXIDINE GLUCONATE CLOTH 2 % EX PADS
6.0000 | MEDICATED_PAD | Freq: Every day | CUTANEOUS | Status: DC
Start: 2024-06-28 — End: 2024-06-30
  Administered 2024-06-29 – 2024-06-30 (×2): 6 via TOPICAL

## 2024-06-28 NOTE — Plan of Care (Addendum)
 [facility] Patient Discharge Orders- [facility] CLINIC: [name]  Patient's name: [name] Admit/DC Dates: 06/24/2024 - 06/28/24  Discharge Diagnoses: R BKA wound s/p revision Sacral decub ulcer with osteomyelitis - Daptomycin and Cefepime  as noted below   HD ORDER CHANGES: Heparin  change: no EDW Change: yes - 61kg Bath Change: no       ANEMIA MANAGEMENT: Aranesp : Given: yes - on 06/25/24  PRBC's Given: yes Date/# of units: 1 unit on 06/26/24 ESA dose for discharge:no change in dose, next due on 10/10  IV Iron  dose at discharge: none   BONE/MINERAL MEDICATIONS: Hectorol/Calcitriol  change: no Sensipar/Parsabiv change: no   ACCESS INTERVENTION/CHANGE: no change Details:   RECENT LABS: Recent Labs  Lab 06/24/24 0951 06/25/24 0421 06/27/24 0938  K 4.1   < > 3.7  CALCIUM  8.7*   < > 8.0*  ALBUMIN  2.3*  --   --    < > = values in this interval not displayed.   Recent Labs  Lab 06/27/24 0938  HGB 10.4*   Blood Culture    Component Value Date/Time   SDES TISSUE 06/24/2024 1014   SPECREQUEST RIGHT KNEE 06/24/2024 1014   CULT  06/24/2024 1014    RARE ENTEROBACTER CLOACAE NO ANAEROBES ISOLATED; CULTURE IN PROGRESS FOR 5 DAYS    REPTSTATUS PENDING 06/24/2024 1014       IV ANTIBIOTICS: YES Details:  Daptomycin 600mg  IV qHD and  Cefepime  2g IV qHD  with end date of both - 07/09/24.   OTHER ANTICOAGULATION:  On Eliquis : no On Coumadin: no   OTHER/APPTS/LAB ORDERS:     D/C Meds to be reconciled by nurse after every discharge.  Completed By: Manuelita Labella PA-C   Reviewed by: MD:______ RN_______

## 2024-06-28 NOTE — Progress Notes (Signed)
 Patient ID: [name], female   DOB: 03/23/1956, 68 y.o.   MRN: [id number] Patient is postoperative day 3 revision transtibial amputation.'s are showing Enterobacter clocae.  Infectious disease is consulted and has switched to daptomycin due to thrombocytopenia.  Patient did receive transfusion with hemodialysis with an appropriate response and her hemoglobin level.  Pending discharge home for coordination of antibiotics with her dialysis team.  Next dialysis is 10/6

## 2024-06-28 NOTE — TOC Transition Note (Signed)
 Transition of Care [facility]) - Discharge Note   Patient Details  Name: [name] MRN: [id number] Date of Birth: 1956-04-05  [facility]) CM/SW Contact:  Corean JAYSON Canary, RN Phone Number: 06/28/2024, 1:21 PM   Clinical Narrative:    Patient may DC today after dialysis. She is active with [facility] for RN, PT OT and [facility] aide.  She will get IV antibiotics  until 10/16 when going to dialysis.  Awaiting final word from ID   Final next level of care: Home w Home Health Services Barriers to Discharge: Continued Medical Work up   Patient Goals and CMS Choice     Choice offered to / list presented to : Patient      Discharge Placement                       Discharge Plan and Services Additional resources added to the After Visit Summary for     Discharge Planning Services: CM Consult                      HH Arranged: Nurse's Aide [facility] Agency: [facility] Date [facility] Agency Contacted: 06/26/24 Time HH Agency Contacted: [phone]    Social Drivers of Health (SDOH) Interventions SDOH Screenings   Food Insecurity: No Food Insecurity (06/24/2024)  Housing: Unknown (06/24/2024)  Transportation Needs: No Transportation Needs (06/24/2024)  Utilities: Not At Risk (06/24/2024)  Depression (PHQ2-9): Low Risk  (02/28/2021)  Financial Resource Strain: Low Risk  (08/25/2021)   Received from [facility]  Physical Activity: Insufficiently Active (06/20/2021)   Received from [facility]  Social Connections: Moderately Isolated (06/24/2024)  Stress: No Stress Concern Present (08/25/2021)   Received from [facility]  Tobacco Use: Low Risk  (06/23/2024)     Readmission Risk Interventions     No data to display

## 2024-06-28 NOTE — Plan of Care (Signed)

## 2024-06-28 NOTE — Progress Notes (Addendum)
 [facility] Progress Note   Subjective:   Patient seen and examined at bedside.  Reports feeling pretty good this AM.  No specific complaints.   Reports she has to let her transportation know 24 hours in advance to schedule transport.  So she will need to be informed the day prior to discharge to arrange this.   Objective Vitals:   06/27/24 1513 06/27/24 2047 06/28/24 0621 06/28/24 0817  BP: 116/60 (!) 100/54 117/63 (!) 117/102  Pulse: 78 80 73 71  Resp: 16 17 17 15   Temp: 99.1 F (37.3 C) 99 F (37.2 C) 98.2 F (36.8 C) 97.9 F (36.6 C)  TempSrc: Oral Oral    SpO2: 100% 98% 100%   Weight:      Height:       Physical Exam General:chronically ill appearing female in NAD Heart:RRR, no mrg Lungs:CTAB, nml WOB on RA Abdomen:soft, NTND Extremities:no LE edema, R BKA dressed Dialysis Access: [facility]  Filed Weights   06/24/24 0909 06/25/24 0710 06/25/24 1028  Weight: 62.1 kg 62.1 kg 60.5 kg    Intake/Output Summary (Last 24 hours) at 06/28/2024 1055 Last data filed at 06/27/2024 1900 Gross per 24 hour  Intake 402 ml  Output 0 ml  Net 402 ml    Additional Objective Labs: Basic Metabolic Panel: Recent Labs  Lab 06/25/24 0421 06/26/24 0359 06/27/24 0938  NA 134* 132* 133*  K 4.5 3.7 3.7  CL 103 99 99  CO2 19* 23 24  GLUCOSE 117* 84 151*  BUN 37* 22 12  CREATININE 7.00* 4.88* 3.87*  CALCIUM  8.5* 8.3* 8.0*   Liver Function Tests: Recent Labs  Lab 06/24/24 0951  AST 15  ALT 12  ALKPHOS 64  BILITOT 0.6  PROT 6.0*  ALBUMIN  2.3*   CBC: Recent Labs  Lab 06/24/24 0951 06/25/24 0421 06/26/24 0359 06/27/24 0938  WBC 8.0 8.8 8.1 10.7*  NEUTROABS 5.3  --   --   --   HGB 8.5* 7.2* 6.6* 10.4*  HCT 27.9* 23.1* 21.1* 32.4*  MCV 87.5 84.6 84.1 84.2  PLT 128* 125* 145* 186   Blood Culture    Component Value Date/Time   SDES TISSUE 06/24/2024 1014   SPECREQUEST RIGHT KNEE 06/24/2024 1014   CULT  06/24/2024 1014    RARE ENTEROBACTER CLOACAE NO  ANAEROBES ISOLATED; CULTURE IN PROGRESS FOR 5 DAYS    REPTSTATUS PENDING 06/24/2024 1014    Cardiac Enzymes: Recent Labs  Lab 06/24/24 0926  CKTOTAL 25*   CBG: Recent Labs  Lab 06/27/24 1122 06/27/24 1641 06/27/24 2146 06/28/24 0620 06/28/24 0818  GLUCAP 181* 102* 128* 121* 100*    Medications:  ceFEPime  (MAXIPIME ) IV 2 g (06/26/24 2030)   DAPTOmycin 600 mg (06/26/24 2249)    brimonidine   1 drop Both Eyes BID   carvedilol   25 mg Oral BID WC   Chlorhexidine  Gluconate Cloth  6 each Topical Q0600   Chlorhexidine  Gluconate Cloth  6 each Topical Q0600   collagenase    Topical Daily   docusate sodium   100 mg Oral Daily   insulin  aspart  0-9 Units Subcutaneous TID WC   isosorbide  dinitrate  20 mg Oral Daily   latanoprost   1 drop Both Eyes QHS   nutrition supplement (JUVEN)  1 packet Oral BID BM   sodium chloride  flush  3 mL Intravenous Q12H   zinc  sulfate (50mg  elemental zinc )  220 mg Oral Daily    Dialysis Orders: [facility] MWF 4h  B350   62.8kg  3K bath  TDC   Heparin  2500 Last OP HD 9/29, post wt 62.3kg Getting to dry wt Small wt gains 0.5 -1.5kg usually, occ 2-3kg Mircera 150 mcg q 2 wks, last 9/19, due 10/3 on Friday   Assessment/Plan: R BKA wound s/p revision.  Per ortho & ID. ABX as noted below. H/o  sacral decub ulcer w osteomyelitis. Bone Cx + Proteus + VRE. ID consulted for abx - Per ID Dr. Epifanio, plans for daptomycin + cefepime  through 10/16.  ESRD HD MWF. Next HD 06/29/24.   HTN/volume- BP/volume ok. Below dry weight now, will need to be lowered on discharge. Anemia. Hgb dropped post op. 1 unit pRBC given with HD 06/26/24. Continue Aranesp  150 qThurs. Hgb 10.4.  MBD. Ca acceptable. Check phos with HD. Continue home meds.  Nutrition - Renal diet w/fluid restrictions. Alb low - protein supplements Thrombocytopenia - likely due to linezolid , ABX d/c. Labs improving.  Dispo - requires 24hr notice prior to d/c to schedule transportation  [employer],  PA-C [facility] 06/28/2024,10:55 AM  LOS: 4 days

## 2024-06-29 LAB — CBC
HCT: 30.8 % — ABNORMAL LOW (ref 36.0–46.0)
Hemoglobin: 9.7 g/dL — ABNORMAL LOW (ref 12.0–15.0)
MCH: 27.4 pg (ref 26.0–34.0)
MCHC: 31.5 g/dL (ref 30.0–36.0)
MCV: 87 fL (ref 80.0–100.0)
Platelets: 329 K/uL (ref 150–400)
RBC: 3.54 MIL/uL — ABNORMAL LOW (ref 3.87–5.11)
RDW: 19.9 % — ABNORMAL HIGH (ref 11.5–15.5)
WBC: 10.7 K/uL — ABNORMAL HIGH (ref 4.0–10.5)
nRBC: 0.2 % (ref 0.0–0.2)

## 2024-06-29 LAB — GLUCOSE, CAPILLARY
Glucose-Capillary: 137 mg/dL — ABNORMAL HIGH (ref 70–99)
Glucose-Capillary: 110 mg/dL — ABNORMAL HIGH (ref 70–99)
Glucose-Capillary: 111 mg/dL — ABNORMAL HIGH (ref 70–99)
Glucose-Capillary: 92 mg/dL (ref 70–99)
Glucose-Capillary: 115 mg/dL — ABNORMAL HIGH (ref 70–99)

## 2024-06-29 LAB — RENAL FUNCTION PANEL
Albumin: 1.7 g/dL — ABNORMAL LOW (ref 3.5–5.0)
Anion gap: 14 (ref 5–15)
BUN: 53 mg/dL — ABNORMAL HIGH (ref 8–23)
CO2: 24 mmol/L (ref 22–32)
Calcium: 8.6 mg/dL — ABNORMAL LOW (ref 8.9–10.3)
Chloride: 95 mmol/L — ABNORMAL LOW (ref 98–111)
Creatinine, Ser: 6.52 mg/dL — ABNORMAL HIGH (ref 0.44–1.00)
GFR, Estimated: 6 mL/min — ABNORMAL LOW (ref >60)
Glucose, Bld: 122 mg/dL — ABNORMAL HIGH (ref 70–99)
Phosphorus: 4 mg/dL (ref 2.5–4.6)
Potassium: 4 mmol/L (ref 3.5–5.1)
Sodium: 133 mmol/L — ABNORMAL LOW (ref 135–145)

## 2024-06-29 LAB — AEROBIC/ANAEROBIC CULTURE W GRAM STAIN (SURGICAL/DEEP WOUND): Gram Stain: NONE SEEN

## 2024-06-29 MED ORDER — ALTEPLASE 2 MG IJ SOLR
2.0000 mg | Freq: Once | INTRAMUSCULAR | Status: AC | PRN
  Administered 2024-06-29: 2 mg

## 2024-06-29 MED ORDER — ALTEPLASE 2 MG IJ SOLR
INTRAMUSCULAR | Status: AC
  Filled 2024-06-29: qty 4

## 2024-06-29 MED ORDER — CEFEPIME IV (FOR PTA / DISCHARGE USE ONLY): 2.0000 g | INTRAVENOUS | Status: AC

## 2024-06-29 MED ORDER — DAPTOMYCIN 500 MG IV SOLR
10.0000 mg/kg | INTRAVENOUS | Status: DC
  Administered 2024-06-29: 600 mg via INTRAVENOUS
  Filled 2024-06-29: qty 12

## 2024-06-29 MED ORDER — MIDODRINE HCL 5 MG PO TABS
ORAL_TABLET | ORAL | Status: AC
  Filled 2024-06-29: qty 1

## 2024-06-29 MED ORDER — DAPTOMYCIN IV (FOR PTA / DISCHARGE USE ONLY): 600.0000 mg | INTRAVENOUS | Status: AC

## 2024-06-29 NOTE — Progress Notes (Addendum)
 Contacted [facility] to be advised of pt's iv abx needs at d/c. Clinic also advised pt for possible d/c tomorrow per notes. Will assist as needed.   Randine Mungo Dialysis Navigator [phone]

## 2024-06-29 NOTE — Progress Notes (Signed)
 PHARMACY CONSULT NOTE FOR:  OUTPATIENT  PARENTERAL ANTIBIOTIC THERAPY (OPAT)  Indication: Sacral OM and stump infection Regimen: daptomycin IV 600 mg on MWF with HD + cefepime  2g IV MWF with HD End date: 07/09/2024  IV antibiotic discharge orders are pended. To discharging provider:  please sign these orders via discharge navigator,  Select New Orders & click on the button choice - Manage This Unsigned Work.     Thank you for allowing pharmacy to be a part of this patient's care.  Feliciano Close, PharmD PGY2 Infectious Diseases Pharmacy Resident  06/29/2024 2:23 PM

## 2024-06-29 NOTE — Progress Notes (Signed)
   06/29/24 1752  Vitals  Temp 98 F (36.7 C)  Pulse Rate 73  Resp 13  BP 98/86  SpO2 99 %  O2 Device Room Air  Weight 64 kg  Oxygen Therapy  Pulse Oximetry Type Continuous  Oximetry Probe Site Changed No  Post Treatment  Dialyzer Clearance Clear  Hemodialysis Intake (mL) 0 mL  Liters Processed 50.7  Fluid Removed (mL) 1200 mL  Tolerated HD Treatment Yes  Post-Hemodialysis Comments TERMINATED HD EARLY PER PT REQUEST, ADVISED OF RISKS   Received patient in bed to unit.  Alert and oriented.  Informed consent signed and in chart.   TX duration:2HRS 48 MINS PT REQUEST DUE TO TPA INSTILLATION  Patient tolerated well.  Transported back to the room  Alert, without acute distress.  Hand-off given to patient's nurse.   Access used: [facility] Access issues: NONE  Total UF removed: 1.2L Medication(s) given: MIDODRINE     [facility]

## 2024-06-29 NOTE — Progress Notes (Signed)
 Patient ID: [name], female   DOB: December 22, 1955, 68 y.o.   MRN: [id number] Patient states that she needs to call her ride before 5 today for discharge tomorrow.  No new drainage of the wound VAC canister.  Plan for discharge with the Prevena plus portable wound VAC pump.  Plan for daptomycin with her dialysis.

## 2024-06-29 NOTE — Plan of Care (Signed)

## 2024-06-29 NOTE — Progress Notes (Signed)
 [facility] Progress Note   Subjective:    Seen in room. No new events overnight. Waiting for dialysis today. Plans for discharge tomorrow.   Objective Vitals:   06/28/24 0817 06/28/24 1939 06/29/24 0430 06/29/24 0822  BP: (!) 117/102 103/78 (!) 99/50 (!) 140/114  Pulse: 71 74 79 71  Resp: 15 17 17 17   Temp: 97.9 F (36.6 C) 98.2 F (36.8 C) 98.4 F (36.9 C) 98.2 F (36.8 C)  TempSrc:      SpO2:  100% 100% 98%  Weight:      Height:       Physical Exam General: well appearing, nad  Heart: RRR, no mrg Lungs: Clear, normal wob Abdomen: non-tender  Extremities: no LE edema, R BKA, wound VAC in place  Dialysis Access: [facility]  Filed Weights   06/24/24 0909 06/25/24 0710 06/25/24 1028  Weight: 62.1 kg 62.1 kg 60.5 kg    Intake/Output Summary (Last 24 hours) at 06/29/2024 1031 Last data filed at 06/29/2024 0903 Gross per 24 hour  Intake 1020.84 ml  Output 0 ml  Net 1020.84 ml    Additional Objective Labs: Basic Metabolic Panel: Recent Labs  Lab 06/25/24 0421 06/26/24 0359 06/27/24 0938  NA 134* 132* 133*  K 4.5 3.7 3.7  CL 103 99 99  CO2 19* 23 24  GLUCOSE 117* 84 151*  BUN 37* 22 12  CREATININE 7.00* 4.88* 3.87*  CALCIUM  8.5* 8.3* 8.0*   Liver Function Tests: Recent Labs  Lab 06/24/24 0951  AST 15  ALT 12  ALKPHOS 64  BILITOT 0.6  PROT 6.0*  ALBUMIN  2.3*   CBC: Recent Labs  Lab 06/24/24 0951 06/25/24 0421 06/26/24 0359 06/27/24 0938  WBC 8.0 8.8 8.1 10.7*  NEUTROABS 5.3  --   --   --   HGB 8.5* 7.2* 6.6* 10.4*  HCT 27.9* 23.1* 21.1* 32.4*  MCV 87.5 84.6 84.1 84.2  PLT 128* 125* 145* 186   Blood Culture    Component Value Date/Time   SDES TISSUE 06/24/2024 1014   SPECREQUEST RIGHT KNEE 06/24/2024 1014   CULT  06/24/2024 1014    RARE ENTEROBACTER CLOACAE NO ANAEROBES ISOLATED; CULTURE IN PROGRESS FOR 5 DAYS    REPTSTATUS PENDING 06/24/2024 1014    Cardiac Enzymes: Recent Labs  Lab 06/24/24 0926  CKTOTAL 25*    CBG: Recent Labs  Lab 06/28/24 1300 06/28/24 1636 06/28/24 2126 06/29/24 0629 06/29/24 0823  GLUCAP 121* 152* 119* 137* 110*    Medications:  ceFEPime  (MAXIPIME ) IV 2 g (06/26/24 2030)   DAPTOmycin Stopped (06/28/24 1609)    brimonidine   1 drop Both Eyes BID   carvedilol   25 mg Oral BID WC   Chlorhexidine  Gluconate Cloth  6 each Topical Q0600   collagenase    Topical Daily   docusate sodium   100 mg Oral Daily   insulin  aspart  0-9 Units Subcutaneous TID WC   isosorbide  dinitrate  20 mg Oral Daily   latanoprost   1 drop Both Eyes QHS   nutrition supplement (JUVEN)  1 packet Oral BID BM   sodium chloride  flush  3 mL Intravenous Q12H   zinc  sulfate (50mg  elemental zinc )  220 mg Oral Daily    Dialysis Orders: [facility] MWF 4h  B350   62.8kg  3K bath  TDC   Heparin  2500 Last OP HD 9/29, post wt 62.3kg Getting to dry wt Small wt gains 0.5 -1.5kg usually, occ 2-3kg Mircera 150 mcg q 2 wks, last 9/19, due 10/3 on  Friday   Assessment/Plan: R BKA wound s/p revision.  Per ortho & ID. ABX as noted below. H/o  sacral decub ulcer w osteomyelitis. Bone Cx + Proteus + VRE. ID consulted for abx - Per ID Dr. Epifanio, plans for daptomycin + cefepime  through 10/16.  ESRD HD MWF. Next HD 06/29/24.   HTN/volume- BP/volume ok. Below dry weight now, will need to be lowered on discharge. Anemia. Hgb dropped post op. 1 unit pRBC given with HD 06/26/24. Continue Aranesp  150 qThurs. Hgb 10.4.  MBD. Ca acceptable. Check phos with HD. Continue home meds.  Nutrition - Renal diet w/fluid restrictions. Alb low - protein supplements Thrombocytopenia - likely due to linezolid , ABX d/c. Labs improving.  Dispo - requires 24hr notice prior to d/c to schedule transportation  Maisie Ronnald Acosta PA-C [facility] 06/29/2024,10:34 AM

## 2024-06-30 ENCOUNTER — Other Ambulatory Visit (HOSPITAL_COMMUNITY): Payer: Self-pay

## 2024-06-30 ENCOUNTER — Encounter (HOSPITAL_COMMUNITY): Payer: Self-pay | Admitting: *Deleted

## 2024-06-30 LAB — CK: Total CK: 33 U/L — ABNORMAL LOW (ref 38–234)

## 2024-06-30 LAB — GLUCOSE, CAPILLARY: Glucose-Capillary: 140 mg/dL — ABNORMAL HIGH (ref 70–99)

## 2024-06-30 LAB — HEPATITIS B SURFACE ANTIBODY, QUANTITATIVE: Hep B S AB Quant (Post): 3292 m[IU]/mL

## 2024-06-30 MED ORDER — OXYCODONE HCL 5 MG PO TABS
5.0000 mg | ORAL_TABLET | Freq: Four times a day (QID) | ORAL | 0 refills | Status: DC | PRN
  Filled 2024-06-30: qty 30, 8d supply, fill #0

## 2024-06-30 NOTE — Progress Notes (Signed)
 [facility] Progress Note   Subjective:    Seen and examined patient at bedside. Noted she signed off HD early yesterday. Noted net UF 1.2L. She tells me she is going home today and just getting something to help alleviate current nausea. Noted dc order.  Objective Vitals:   06/29/24 1801 06/29/24 1954 06/30/24 0429 06/30/24 0730  BP: 112/60 (!) 117/54 (!) 82/39 (!) 97/59  Pulse: 76 79 81 81  Resp: 16 13 15 17   Temp: 98.2 F (36.8 C) 97.9 F (36.6 C) 98.6 F (37 C) (!) 97.5 F (36.4 C)  TempSrc: Oral  Oral   SpO2: 99% 99% 100% 100%  Weight:      Height:       Physical Exam General: well appearing, nad  Heart: RRR, no mrg Lungs: Clear, normal wob Abdomen: non-tender  Extremities: no LE edema, R BKA, wound VAC in place  Dialysis Access: [facility]  Filed Weights   06/25/24 1028 06/29/24 1326 06/29/24 1752  Weight: 60.5 kg 65.5 kg 64 kg    Intake/Output Summary (Last 24 hours) at 06/30/2024 0938 Last data filed at 06/30/2024 0437 Gross per 24 hour  Intake 382.48 ml  Output 1200 ml  Net -817.52 ml    Additional Objective Labs: Basic Metabolic Panel: Recent Labs  Lab 06/26/24 0359 06/27/24 0938 06/29/24 1416  NA 132* 133* 133*  K 3.7 3.7 4.0  CL 99 99 95*  CO2 23 24 24   GLUCOSE 84 151* 122*  BUN 22 12 53*  CREATININE 4.88* 3.87* 6.52*  CALCIUM  8.3* 8.0* 8.6*  PHOS  --   --  4.0   Liver Function Tests: Recent Labs  Lab 06/24/24 0951 06/29/24 1416  AST 15  --   ALT 12  --   ALKPHOS 64  --   BILITOT 0.6  --   PROT 6.0*  --   ALBUMIN  2.3* 1.7*   No results for input(s): LIPASE, AMYLASE in the last 168 hours. CBC: Recent Labs  Lab 06/24/24 0951 06/25/24 0421 06/26/24 0359 06/27/24 0938 06/29/24 1417  WBC 8.0 8.8 8.1 10.7* 10.7*  NEUTROABS 5.3  --   --   --   --   HGB 8.5* 7.2* 6.6* 10.4* 9.7*  HCT 27.9* 23.1* 21.1* 32.4* 30.8*  MCV 87.5 84.6 84.1 84.2 87.0  PLT 128* 125* 145* 186 329   Blood Culture    Component Value  Date/Time   SDES TISSUE 06/24/2024 1014   SPECREQUEST RIGHT KNEE 06/24/2024 1014   CULT  06/24/2024 1014    RARE ENTEROBACTER CLOACAE NO ANAEROBES ISOLATED Performed at [facility], [address], [place], [place] [address]    REPTSTATUS 06/29/2024 FINAL 06/24/2024 1014    Cardiac Enzymes: Recent Labs  Lab 06/24/24 0926 06/30/24 0628  CKTOTAL 25* 33*   CBG: Recent Labs  Lab 06/29/24 0823 06/29/24 1154 06/29/24 1800 06/29/24 2112 06/30/24 0614  GLUCAP 110* 111* 92 115* 140*   Iron  Studies: No results for input(s): IRON , TIBC, TRANSFERRIN, FERRITIN in the last 72 hours. Lab Results  Component Value Date   INR 1.1 04/19/2024   INR >10.0 (HH) 10/28/2023   INR 1.32 02/13/2013   Studies/Results: No results found.  Medications:  ceFEPime  (MAXIPIME ) IV 2 g (06/29/24 2017)   DAPTOmycin Stopped (06/29/24 2134)    brimonidine   1 drop Both Eyes BID   carvedilol   25 mg Oral BID WC   Chlorhexidine  Gluconate Cloth  6 each Topical Q0600   collagenase   Topical Daily   docusate sodium   100 mg Oral Daily   insulin  aspart  0-9 Units Subcutaneous TID WC   isosorbide  dinitrate  20 mg Oral Daily   latanoprost   1 drop Both Eyes QHS   nutrition supplement (JUVEN)  1 packet Oral BID BM   sodium chloride  flush  3 mL Intravenous Q12H   zinc  sulfate (50mg  elemental zinc )  220 mg Oral Daily    Dialysis Orders: [facility] MWF 4h  B350   62.8kg  3K bath  TDC   Heparin  2500 Last OP HD 9/29, post wt 62.3kg Getting to dry wt Small wt gains 0.5 -1.5kg usually, occ 2-3kg Mircera 150 mcg q 2 wks, last 9/19, due 10/3 on Friday   Assessment/Plan: R BKA wound s/p revision.  Per ortho & ID. ABX as noted below. H/o  sacral decub ulcer w osteomyelitis. Bone Cx + Proteus + VRE. ID consulted for abx - Per pharmacy note from 10/6: plans for daptomycin 600mg  IV + cefepime  2GM IV both with HD through 10/16.  ESRD HD MWF. Next HD 10/8 in outpatient.   HTN/volume- BP/volume ok. Below dry  weight now, will need to be lowered on discharge. Anemia. Hgb dropped post op. 1 unit pRBC given with HD 06/26/24. Continue Aranesp  150 qThurs. Hgb 10.4.  MBD. Ca acceptable. Check phos with HD. Continue home meds.  Nutrition - Renal diet w/fluid restrictions. Alb low - protein supplements Thrombocytopenia - likely due to linezolid , ABX d/c. Labs improving.  Dispo - requires 24hr notice prior to d/c to schedule transportation. It appears she is going to be discharged today.   Charmaine Piety, NP [facility] 06/30/2024,9:38 AM  LOS: 6 days

## 2024-06-30 NOTE — Discharge Summary (Signed)
 Physician Discharge Summary  Patient ID: [name] MRN: [id number] DOB/AGE: 68-02-1956 68 y.o.  Admit date: 06/24/2024 Discharge date: 06/30/2024  Admission Diagnoses:  Principal Problem:   Non-healing wound of amputation stump [facility])   Discharge Diagnoses:  Same  Past Medical History:  Diagnosis Date   Anemia    Arthritis    Atrial fibrillation (HCC)    CAD (coronary artery disease)    Chronic kidney disease, stage III (moderate) (HCC)    Constipation    Diabetes mellitus (HCC)    ESRD on dialysis (HCC) 04/20/2024   History of blood transfusion    HLD (hyperlipidemia)    Hypertension    Myocardial infarction [facility])    in April 2014   Septic shock (HCC) 04/19/2024   Wound infection after surgery 03/17/2013    Surgeries: Procedure(s): REVISION AMPUTATION, BELOW THE KNEE on 06/24/2024   Consultants: Treatment Team:  Geralynn Charleston, MD  Discharged Condition: Improved  Hospital Course: [name] is an 68 y.o. female who was admitted 06/24/2024 with a chief complaint of No chief complaint on file. , and found to have a diagnosis of Non-healing wound of amputation stump (HCC).  They were brought to the operating room on 06/24/2024 and underwent the above named procedures.    They were given perioperative antibiotics:  Anti-infectives (From admission, onward)    Start     Dose/Rate Route Frequency Ordered Stop   06/29/24 2000  DAPTOmycin (CUBICIN) 600 mg in sodium chloride  0.9 % IVPB        10 mg/kg  62.1 kg 124 mL/hr over 30 Minutes Intravenous Every M-W-F (2000) 06/29/24 1419     06/29/24 0000  ceFEPime  (MAXIPIME ) IVPB        2 g Intravenous Every M-W-F (Hemodialysis) 06/29/24 1431 07/09/24 2359   06/29/24 0000  daptomycin (CUBICIN) IVPB        600 mg Intravenous Every M-W-F (Hemodialysis) 06/29/24 1431 07/09/24 2359   06/26/24 2000  ceFEPIme  (MAXIPIME ) 2 g in sodium chloride  0.9 % 100 mL IVPB        2 g 200 mL/hr over 30 Minutes Intravenous Every  M-W-F (2000) 06/26/24 1343     06/26/24 1200  cefTAZidime  (FORTAZ ) 1 g in sodium chloride  0.9 % 100 mL IVPB  Status:  Discontinued        1 g 200 mL/hr over 30 Minutes Intravenous Every M-W-F (Hemodialysis) 06/24/24 1156 06/24/24 1548   06/25/24 1530  cefTAZidime  (FORTAZ ) 0.5 g in dextrose  5 % 50 mL IVPB        0.5 g 100 mL/hr over 30 Minutes Intravenous  Once 06/25/24 1439 06/25/24 1909   06/24/24 2000  DAPTOmycin (CUBICIN) 600 mg in sodium chloride  0.9 % IVPB  Status:  Discontinued        10 mg/kg  62.1 kg 124 mL/hr over 30 Minutes Intravenous Every 48 hours 06/24/24 1519 06/29/24 1419   06/24/24 2000  cefTAZidime  (FORTAZ ) 1 g in sodium chloride  0.9 % 100 mL IVPB  Status:  Discontinued        1 g 200 mL/hr over 30 Minutes Intravenous Every M-W-F (2000) 06/24/24 1548 06/26/24 1315   06/24/24 1245  linezolid  (ZYVOX ) tablet 600 mg  Status:  Discontinued        600 mg Oral Every 12 hours 06/24/24 1156 06/24/24 1340   06/24/24 0930  ceFAZolin  (ANCEF ) IVPB 2g/100 mL premix  Status:  Discontinued        2 g 200 mL/hr over 30 Minutes  Intravenous On call to O.R. 06/24/24 0925 06/24/24 1151   06/24/24 0928  ceFAZolin  (ANCEF ) 2-4 GM/100ML-% IVPB       Note to Pharmacy: Effie Rily O: cabinet override      06/24/24 0928 06/24/24 1008     .  They were given compression stockings, early ambulation, and chemoprophylaxis for DVT prophylaxis.  They benefited maximally from their hospital stay and there were no complications.    Recent vital signs:  Vitals:   06/29/24 1954 06/30/24 0429  BP: (!) 117/54 (!) 82/39  Pulse: 79 81  Resp: 13 15  Temp: 97.9 F (36.6 C) 98.6 F (37 C)  SpO2: 99% 100%    Recent laboratory studies:  Results for orders placed or performed during the hospital encounter of 06/24/24  Glucose, capillary   Collection Time: 06/24/24  9:13 AM  Result Value Ref Range   Glucose-Capillary 100 (H) 70 - 99 mg/dL  CK   Collection Time: 06/24/24  9:26 AM  Result Value  Ref Range   Total CK 25 (L) 38 - 234 U/L  I-STAT, chem 8   Collection Time: 06/24/24  9:48 AM  Result Value Ref Range   Sodium 132 (L) 135 - 145 mmol/L   Potassium 5.7 (H) 3.5 - 5.1 mmol/L   Chloride 106 98 - 111 mmol/L   BUN 46 (H) 8 - 23 mg/dL   Creatinine, Ser 3.39 (H) 0.44 - 1.00 mg/dL   Glucose, Bld 879 (H) 70 - 99 mg/dL   Calcium , Ion 1.01 (L) 1.15 - 1.40 mmol/L   TCO2 20 (L) 22 - 32 mmol/L   Hemoglobin 9.9 (L) 12.0 - 15.0 g/dL   HCT 70.9 (L) 63.9 - 53.9 %  CBC WITH DIFFERENTIAL   Collection Time: 06/24/24  9:51 AM  Result Value Ref Range   WBC 8.0 4.0 - 10.5 K/uL   RBC 3.19 (L) 3.87 - 5.11 MIL/uL   Hemoglobin 8.5 (L) 12.0 - 15.0 g/dL   HCT 72.0 (L) 63.9 - 53.9 %   MCV 87.5 80.0 - 100.0 fL   MCH 26.6 26.0 - 34.0 pg   MCHC 30.5 30.0 - 36.0 g/dL   RDW 77.8 (H) 88.4 - 84.4 %   Platelets 128 (L) 150 - 400 K/uL   nRBC 0.0 0.0 - 0.2 %   Neutrophils Relative % 66 %   Neutro Abs 5.3 1.7 - 7.7 K/uL   Lymphocytes Relative 16 %   Lymphs Abs 1.3 0.7 - 4.0 K/uL   Monocytes Relative 15 %   Monocytes Absolute 1.2 (H) 0.1 - 1.0 K/uL   Eosinophils Relative 2 %   Eosinophils Absolute 0.2 0.0 - 0.5 K/uL   Basophils Relative 1 %   Basophils Absolute 0.1 0.0 - 0.1 K/uL   Immature Granulocytes 0 %   Abs Immature Granulocytes 0.03 0.00 - 0.07 K/uL  Comprehensive metabolic panel   Collection Time: 06/24/24  9:51 AM  Result Value Ref Range   Sodium 134 (L) 135 - 145 mmol/L   Potassium 4.1 3.5 - 5.1 mmol/L   Chloride 103 98 - 111 mmol/L   CO2 19 (L) 22 - 32 mmol/L   Glucose, Bld 122 (H) 70 - 99 mg/dL   BUN 32 (H) 8 - 23 mg/dL   Creatinine, Ser 3.76 (H) 0.44 - 1.00 mg/dL   Calcium  8.7 (L) 8.9 - 10.3 mg/dL   Total Protein 6.0 (L) 6.5 - 8.1 g/dL   Albumin  2.3 (L) 3.5 - 5.0 g/dL   AST  15 15 - 41 U/L   ALT 12 0 - 44 U/L   Alkaline Phosphatase 64 38 - 126 U/L   Total Bilirubin 0.6 0.0 - 1.2 mg/dL   GFR, Estimated 7 (L) >60 mL/min   Anion gap 12 5 - 15  Aerobic/Anaerobic Culture w  Gram Stain (surgical/deep wound)   Collection Time: 06/24/24 10:14 AM   Specimen: Soft Tissue, Other  Result Value Ref Range   Specimen Description TISSUE    Special Requests RIGHT KNEE    Gram Stain NO WBC SEEN NO ORGANISMS SEEN     Culture      RARE ENTEROBACTER CLOACAE NO ANAEROBES ISOLATED Performed at [facility], [address], [place], [place] [address]    Report Status 06/29/2024 FINAL    Organism ID, Bacteria ENTEROBACTER CLOACAE       Susceptibility   Enterobacter cloacae - MIC*    CEFEPIME  <=0.12 SENSITIVE Sensitive     ERTAPENEM <=0.12 SENSITIVE Sensitive     CIPROFLOXACIN  <=0.06 SENSITIVE Sensitive     GENTAMICIN  <=1 SENSITIVE Sensitive     MEROPENEM <=0.25 SENSITIVE Sensitive     TRIMETH/SULFA <=20 SENSITIVE Sensitive     PIP/TAZO Value in next row Sensitive      <=4 SENSITIVEThis is a modified FDA-approved test that has been validated and its performance characteristics determined by the reporting laboratory.  This laboratory is certified under the Clinical Laboratory Improvement Amendments CLIA as qualified to perform high complexity clinical laboratory testing.    * RARE ENTEROBACTER CLOACAE  Glucose, capillary   Collection Time: 06/24/24 10:49 AM  Result Value Ref Range   Glucose-Capillary 135 (H) 70 - 99 mg/dL  Glucose, capillary   Collection Time: 06/24/24 12:10 PM  Result Value Ref Range   Glucose-Capillary 160 (H) 70 - 99 mg/dL  Glucose, capillary   Collection Time: 06/24/24  4:41 PM  Result Value Ref Range   Glucose-Capillary 180 (H) 70 - 99 mg/dL  Glucose, capillary   Collection Time: 06/24/24  9:07 PM  Result Value Ref Range   Glucose-Capillary 151 (H) 70 - 99 mg/dL  CBC   Collection Time: 06/25/24  4:21 AM  Result Value Ref Range   WBC 8.8 4.0 - 10.5 K/uL   RBC 2.73 (L) 3.87 - 5.11 MIL/uL   Hemoglobin 7.2 (L) 12.0 - 15.0 g/dL   HCT 76.8 (L) 63.9 - 53.9 %   MCV 84.6 80.0 - 100.0 fL   MCH 26.4 26.0 - 34.0 pg   MCHC 31.2 30.0 - 36.0  g/dL   RDW 78.5 (H) 88.4 - 84.4 %   Platelets 125 (L) 150 - 400 K/uL   nRBC 0.3 (H) 0.0 - 0.2 %  Basic metabolic panel   Collection Time: 06/25/24  4:21 AM  Result Value Ref Range   Sodium 134 (L) 135 - 145 mmol/L   Potassium 4.5 3.5 - 5.1 mmol/L   Chloride 103 98 - 111 mmol/L   CO2 19 (L) 22 - 32 mmol/L   Glucose, Bld 117 (H) 70 - 99 mg/dL   BUN 37 (H) 8 - 23 mg/dL   Creatinine, Ser 2.99 (H) 0.44 - 1.00 mg/dL   Calcium  8.5 (L) 8.9 - 10.3 mg/dL   GFR, Estimated 6 (L) >60 mL/min   Anion gap 12 5 - 15  Glucose, capillary   Collection Time: 06/25/24 12:53 PM  Result Value Ref Range   Glucose-Capillary 98 70 - 99 mg/dL  Glucose, capillary   Collection Time: 06/25/24  5:24 PM  Result Value Ref Range   Glucose-Capillary 170 (H) 70 - 99 mg/dL  Glucose, capillary   Collection Time: 06/25/24  9:29 PM  Result Value Ref Range   Glucose-Capillary 110 (H) 70 - 99 mg/dL  CBC   Collection Time: 06/26/24  3:59 AM  Result Value Ref Range   WBC 8.1 4.0 - 10.5 K/uL   RBC 2.51 (L) 3.87 - 5.11 MIL/uL   Hemoglobin 6.6 (LL) 12.0 - 15.0 g/dL   HCT 78.8 (L) 63.9 - 53.9 %   MCV 84.1 80.0 - 100.0 fL   MCH 26.3 26.0 - 34.0 pg   MCHC 31.3 30.0 - 36.0 g/dL   RDW 78.6 (H) 88.4 - 84.4 %   Platelets 145 (L) 150 - 400 K/uL   nRBC 0.4 (H) 0.0 - 0.2 %  Basic metabolic panel   Collection Time: 06/26/24  3:59 AM  Result Value Ref Range   Sodium 132 (L) 135 - 145 mmol/L   Potassium 3.7 3.5 - 5.1 mmol/L   Chloride 99 98 - 111 mmol/L   CO2 23 22 - 32 mmol/L   Glucose, Bld 84 70 - 99 mg/dL   BUN 22 8 - 23 mg/dL   Creatinine, Ser 5.11 (H) 0.44 - 1.00 mg/dL   Calcium  8.3 (L) 8.9 - 10.3 mg/dL   GFR, Estimated 9 (L) >60 mL/min   Anion gap 10 5 - 15  Glucose, capillary   Collection Time: 06/26/24  6:14 AM  Result Value Ref Range   Glucose-Capillary 70 70 - 99 mg/dL  Glucose, capillary   Collection Time: 06/26/24  8:28 AM  Result Value Ref Range   Glucose-Capillary 123 (H) 70 - 99 mg/dL  Prepare RBC  (crossmatch)   Collection Time: 06/26/24 11:07 AM  Result Value Ref Range   Order Confirmation      ORDER PROCESSED BY BLOOD BANK Performed at [facility], [address] [place], [place] [address]   Type and screen   Collection Time: 06/26/24 11:07 AM  Result Value Ref Range   ABO/RH(D) A POS    Antibody Screen NEG    Sample Expiration 06/29/2024,2359    Unit Number [id number]    Blood Component Type RED CELLS,LR    Unit division 00    Status of Unit ISSUED,FINAL    Transfusion Status OK TO TRANSFUSE    Crossmatch Result Compatible    Unit Number [id number]    Blood Component Type RED CELLS,LR    Unit division 00    Status of Unit ISSUED,FINAL    Transfusion Status OK TO TRANSFUSE    Crossmatch Result      Compatible Performed at [facility], [address], [place], [place] [address]   BPAM [facility]   Collection Time: 06/26/24 11:07 AM  Result Value Ref Range   ISSUE DATE / TIME 797489968570    Blood Product Unit Number [id number]    PRODUCT CODE E0382V00    Unit Type and Rh 0600    Blood Product Expiration Date 797489907640    ISSUE DATE / TIME 797489968570    Blood Product Unit Number [id number]    PRODUCT CODE E0382V00    Unit Type and Rh 6200    Blood Product Expiration Date 797489747640   Glucose, capillary   Collection Time: 06/26/24  1:24 PM  Result Value Ref Range   Glucose-Capillary 94 70 - 99 mg/dL  Glucose, capillary   Collection Time: 06/26/24  6:02 PM  Result  Value Ref Range   Glucose-Capillary 103 (H) 70 - 99 mg/dL  Glucose, capillary   Collection Time: 06/26/24  9:03 PM  Result Value Ref Range   Glucose-Capillary 113 (H) 70 - 99 mg/dL  Glucose, capillary   Collection Time: 06/27/24  6:08 AM  Result Value Ref Range   Glucose-Capillary 78 70 - 99 mg/dL  CBC   Collection Time: 06/27/24  9:38 AM  Result Value Ref Range   WBC 10.7 (H) 4.0 - 10.5 K/uL   RBC 3.85 (L) 3.87 - 5.11 MIL/uL   Hemoglobin 10.4 (L) 12.0 - 15.0 g/dL    HCT 67.5 (L) 63.9 - 46.0 %   MCV 84.2 80.0 - 100.0 fL   MCH 27.0 26.0 - 34.0 pg   MCHC 32.1 30.0 - 36.0 g/dL   RDW 81.0 (H) 88.4 - 84.4 %   Platelets 186 150 - 400 K/uL   nRBC 0.3 (H) 0.0 - 0.2 %  Basic metabolic panel   Collection Time: 06/27/24  9:38 AM  Result Value Ref Range   Sodium 133 (L) 135 - 145 mmol/L   Potassium 3.7 3.5 - 5.1 mmol/L   Chloride 99 98 - 111 mmol/L   CO2 24 22 - 32 mmol/L   Glucose, Bld 151 (H) 70 - 99 mg/dL   BUN 12 8 - 23 mg/dL   Creatinine, Ser 6.12 (H) 0.44 - 1.00 mg/dL   Calcium  8.0 (L) 8.9 - 10.3 mg/dL   GFR, Estimated 12 (L) >60 mL/min   Anion gap 10 5 - 15  Glucose, capillary   Collection Time: 06/27/24 11:22 AM  Result Value Ref Range   Glucose-Capillary 181 (H) 70 - 99 mg/dL  Glucose, capillary   Collection Time: 06/27/24  4:41 PM  Result Value Ref Range   Glucose-Capillary 102 (H) 70 - 99 mg/dL  Glucose, capillary   Collection Time: 06/27/24  9:46 PM  Result Value Ref Range   Glucose-Capillary 128 (H) 70 - 99 mg/dL  Glucose, capillary   Collection Time: 06/28/24  6:20 AM  Result Value Ref Range   Glucose-Capillary 121 (H) 70 - 99 mg/dL  Glucose, capillary   Collection Time: 06/28/24  8:18 AM  Result Value Ref Range   Glucose-Capillary 100 (H) 70 - 99 mg/dL  Hepatitis B surface antigen   Collection Time: 06/28/24 12:25 PM  Result Value Ref Range   Hepatitis B Surface Ag NON REACTIVE NON REACTIVE  Hepatitis B surface antibody,quantitative   Collection Time: 06/28/24 12:25 PM  Result Value Ref Range   Hep B S AB Quant (Post) 3,292.0 Immunity>10 mIU/mL  Glucose, capillary   Collection Time: 06/28/24  1:00 PM  Result Value Ref Range   Glucose-Capillary 121 (H) 70 - 99 mg/dL  Glucose, capillary   Collection Time: 06/28/24  4:36 PM  Result Value Ref Range   Glucose-Capillary 152 (H) 70 - 99 mg/dL  Glucose, capillary   Collection Time: 06/28/24  9:26 PM  Result Value Ref Range   Glucose-Capillary 119 (H) 70 - 99 mg/dL   Glucose, capillary   Collection Time: 06/29/24  6:29 AM  Result Value Ref Range   Glucose-Capillary 137 (H) 70 - 99 mg/dL  Glucose, capillary   Collection Time: 06/29/24  8:23 AM  Result Value Ref Range   Glucose-Capillary 110 (H) 70 - 99 mg/dL  Glucose, capillary   Collection Time: 06/29/24 11:54 AM  Result Value Ref Range   Glucose-Capillary 111 (H) 70 - 99 mg/dL  Renal function panel  Collection Time: 06/29/24  2:16 PM  Result Value Ref Range   Sodium 133 (L) 135 - 145 mmol/L   Potassium 4.0 3.5 - 5.1 mmol/L   Chloride 95 (L) 98 - 111 mmol/L   CO2 24 22 - 32 mmol/L   Glucose, Bld 122 (H) 70 - 99 mg/dL   BUN 53 (H) 8 - 23 mg/dL   Creatinine, Ser 3.47 (H) 0.44 - 1.00 mg/dL   Calcium  8.6 (L) 8.9 - 10.3 mg/dL   Phosphorus 4.0 2.5 - 4.6 mg/dL   Albumin  1.7 (L) 3.5 - 5.0 g/dL   GFR, Estimated 6 (L) >60 mL/min   Anion gap 14 5 - 15  CBC   Collection Time: 06/29/24  2:17 PM  Result Value Ref Range   WBC 10.7 (H) 4.0 - 10.5 K/uL   RBC 3.54 (L) 3.87 - 5.11 MIL/uL   Hemoglobin 9.7 (L) 12.0 - 15.0 g/dL   HCT 69.1 (L) 63.9 - 53.9 %   MCV 87.0 80.0 - 100.0 fL   MCH 27.4 26.0 - 34.0 pg   MCHC 31.5 30.0 - 36.0 g/dL   RDW 80.0 (H) 88.4 - 84.4 %   Platelets 329 150 - 400 K/uL   nRBC 0.2 0.0 - 0.2 %  Glucose, capillary   Collection Time: 06/29/24  6:00 PM  Result Value Ref Range   Glucose-Capillary 92 70 - 99 mg/dL  Glucose, capillary   Collection Time: 06/29/24  9:12 PM  Result Value Ref Range   Glucose-Capillary 115 (H) 70 - 99 mg/dL  Glucose, capillary   Collection Time: 06/30/24  6:14 AM  Result Value Ref Range   Glucose-Capillary 140 (H) 70 - 99 mg/dL    Discharge Medications:   Allergies as of 06/30/2024       Reactions   Nsaids Other (See Comments)   CKD stage 4   Lisinopril Other (See Comments)   coughing   Peanut-containing Drug Products Itching, Other (See Comments)   GI intolerance - diarrhea        Medication List     PAUSE taking these medications     insulin  glargine 100 UNIT/ML injection Wait to take this until your doctor or other care provider tells you to start again. Commonly known as: Lantus  Inject 0.07 mLs (7 Units total) into the skin daily. Please schedule appointment before next refill. What changed: how much to take   NovoLIN R 100 UNIT/ML injection Wait to take this until your doctor or other care provider tells you to start again. Generic drug: insulin  regular Inject 0-9 Units into the skin in the morning, at noon, and at bedtime. Per sliding scale   NovoLOG  FlexPen 100 UNIT/ML FlexPen Wait to take this until your doctor or other care provider tells you to start again. Generic drug: insulin  aspart 0-9 Units, Subcutaneous, 3 times daily with meals CBG < 70: Implement Hypoglycemia measures CBG 70 - 120: 0 units CBG 121 - 150: 1 unit CBG 151 - 200: 2 units CBG 201 - 250: 3 units CBG 251 - 300: 5 units CBG 301 - 350: 7 units CBG 351 - 400: 9 units CBG > 400: call MD       STOP taking these medications    cefTAZidime  1 g in sodium chloride  0.9 % 100 mL   linezolid  600 MG tablet Commonly known as: ZYVOX        TAKE these medications    Accu-Chek Aviva Plus test strip Generic drug: glucose blood CHECK BLOOD GLUCOSE  THREE TIMES DAILY   Accu-Chek Aviva Plus w/Device Kit CHECK BLOOD GLUCOSE THREE TIMES DAILY   accu-chek softclix lancets Fill for 1 month for TID testing. Use as instructed   acetaminophen  500 MG tablet Commonly known as: TYLENOL  Take 2 tablets (1,000 mg total) by mouth 4 (four) times daily.   albuterol  (2.5 MG/3ML) 0.083% nebulizer solution Commonly known as: PROVENTIL  Take 3 mLs (2.5 mg total) by nebulization every 2 (two) hours as needed for wheezing.   apixaban  5 MG Tabs tablet Commonly known as: ELIQUIS  Take 1 tablet (5 mg total) by mouth 2 (two) times daily.   ascorbic acid  500 MG tablet Commonly known as: VITAMIN C  Take 1 tablet (500 mg total) by mouth 2 (two) times daily.    brimonidine  0.2 % ophthalmic solution Commonly known as: ALPHAGAN  Place 1 drop into both eyes 2 (two) times daily.   calcitRIOL  0.25 MCG capsule Commonly known as: ROCALTROL  TAKE 1 CAPSULE (0.25 MCG TOTAL) EVERY MONDAY, WEDNESDAY, AND FRIDAY What changed: See the new instructions.   carvedilol  25 MG tablet Commonly known as: COREG  Take 25 mg by mouth 2 (two) times daily with a meal.   ceFEPime  IVPB Commonly known as: MAXIPIME  Inject 2 g into the vein every Monday, Wednesday, and Friday with hemodialysis for 10 days. Indication:  Sacral decubitus ulcer + stump infection Last Day of Therapy:  07/09/2024 Labs - Once weekly:  CBC/D and BMP, Labs - Once weekly: ESR and CRP Method of administration: Per HD protocol to be given at HD center   collagenase  250 UNIT/GM ointment Commonly known as: SANTYL  Apply topically 2 (two) times daily.   daptomycin IVPB Commonly known as: CUBICIN Inject 600 mg into the vein every Monday, Wednesday, and Friday with hemodialysis for 10 days. Indication:  Sacral decubitus ulcer + stump infection Last Day of Therapy:  07/09/2024 Labs - Once weekly:  CBC/D, BMP, and CPK Labs - Once weekly: ESR and CRP Method of administration: Per HD protocol to be given at HD center   Darbepoetin Alfa  200 MCG/0.4ML Sosy injection Commonly known as: ARANESP  Inject 0.4 mLs (200 mcg total) into the skin every Sunday at 6pm.   Droplet Insulin  Syringe 31G X 5/16 0.5 ML Misc Generic drug: Insulin  Syringe-Needle U-100 USE FOUR TIMES DAILY FOR INJECTIONS   HYDROmorphone  2 MG tablet Commonly known as: DILAUDID  Take 0.5 tablets (1 mg total) by mouth every 6 (six) hours as needed for severe pain (pain score 7-10) or moderate pain (pain score 4-6).   Insulin  Syringes (Disposable) U-100 0.5 ML Misc 4x daily injections   isosorbide  dinitrate 20 MG tablet Commonly known as: ISORDIL  Take 20 mg by mouth daily.   latanoprost  0.005 % ophthalmic solution Commonly known as:  XALATAN  Place 1 drop into both eyes at bedtime.   leptospermum manuka honey Pste paste Apply 1 Application topically 2 (two) times daily. Apply with wound changes IF santyl  not available.   melatonin 5 MG Tabs Take 10 mg by mouth at bedtime as needed (for insomnia).   metoprolol  tartrate 25 MG tablet Commonly known as: LOPRESSOR  Take 1 tablet (25 mg total) by mouth 2 (two) times daily.   midodrine  2.5 MG tablet Commonly known as: PROAMATINE  Take 2.5 mg by mouth daily as needed (low bp).   multivitamin with minerals Tabs tablet Take 1 tablet by mouth daily.   NEEDLE (DISP) 30 G 30G X 1/2 Misc Commonly known as: BD Disp Needles For 4x daily injections   nutrition supplement (JUVEN) Pack  Take 1 packet by mouth 2 (two) times daily between meals.   oxyCODONE  5 MG immediate release tablet Commonly known as: Oxy IR/ROXICODONE  Take 1 tablet (5 mg total) by mouth every 6 (six) hours as needed for moderate pain (pain score 4-6).   zinc  sulfate (50mg  elemental zinc ) 220 (50 Zn) MG capsule Take 1 capsule (220 mg total) by mouth daily.               Home Infusion Instuctions  (From admission, onward)           Start     Ordered   06/29/24 0000  Home infusion instructions       Comments: To be given at HD center  Question:  Instructions  Answer:  Flushing of vascular access device: 0.9% NaCl pre/post medication administration and prn patency; Heparin  100 u/ml, 5ml for implanted ports and Heparin  10u/ml, 5ml for all other central venous catheters.   06/29/24 1431            Diagnostic Studies: No results found.  Disposition: Discharge disposition: 01-Home or Self Care       Discharge Instructions     Call MD / Call 911   Complete by: As directed    If you experience chest pain or shortness of breath, CALL 911 and be transported to the hospital emergency room.  If you develope a fever above 101 F, pus (white drainage) or increased drainage or redness at the  wound, or calf pain, call your surgeon's office.   Constipation Prevention   Complete by: As directed    Drink plenty of fluids.  Prune juice may be helpful.  You may use a stool softener, such as Colace (over the counter) 100 mg twice a day.  Use MiraLax  (over the counter) for constipation as needed.   Diet - low sodium heart healthy   Complete by: As directed    Home infusion instructions   Complete by: As directed    To be given at HD center   Instructions: Flushing of vascular access device: 0.9% NaCl pre/post medication administration and prn patency; Heparin  100 u/ml, 5ml for implanted ports and Heparin  10u/ml, 5ml for all other central venous catheters.   Increase activity slowly as tolerated   Complete by: As directed    Negative Pressure Wound Therapy - Incisional   Complete by: As directed    Attach the wound VAC dressing to a Prevena plus portable wound VAC pump at discharge.   Post-operative opioid taper instructions:   Complete by: As directed    POST-OPERATIVE OPIOID TAPER INSTRUCTIONS: It is important to wean off of your opioid medication as soon as possible. If you do not need pain medication after your surgery it is ok to stop day one. Opioids include: Codeine, Hydrocodone (Norco, Vicodin), Oxycodone (Percocet, oxycontin ) and hydromorphone  amongst others.  Long term and even short term use of opiods can cause: Increased pain response Dependence Constipation Depression Respiratory depression And more.  Withdrawal symptoms can include Flu like symptoms Nausea, vomiting And more Techniques to manage these symptoms Hydrate well Eat regular healthy meals Stay active Use relaxation techniques(deep breathing, meditating, yoga) Do Not substitute Alcohol  to help with tapering If you have been on opioids for less than two weeks and do not have pain than it is ok to stop all together.  Plan to wean off of opioids This plan should start within one week post op of your joint  replacement. Maintain the same interval or time between  taking each dose and first decrease the dose.  Cut the total daily intake of opioids by one tablet each day Next start to increase the time between doses. The last dose that should be eliminated is the evening dose.           Follow-up Information     Harden Jerona GAILS, MD Follow up in 1 week(s).   Specialty: Orthopedic Surgery Contact information: [address] [place] [address] [phone]         Campbell Reynolds, NP Follow up.   Contact information: [address] [place] [address] [phone]         [facility] Follow up.   Specialty: Home Health Services Why: They will call you to restart services Contact information: [address] [place] [address] [phone]                  Signed: Jerona GAILS Harden 06/30/2024, 6:49 AM

## 2024-06-30 NOTE — Progress Notes (Signed)
 Discharge instructions reviewed with pt.  Copy of instructions given to pt. [facility] has filled script for pt and was picked up by nurse and given to pt in her room. Pt has arranged for [facility] van to pick her up at the front entrance between 10:30-11:00am. Pt will be taken down to entrance A where she told them to pick her up. Pt's nurse placed pt's wound VAC tubing on the Prevena plus portable wound VAC pump for home use.  Pt will be d/c'd via her own wheelchair with belongings and will be escorted by staff.   Jarrius Huaracha,RN SWOT

## 2024-06-30 NOTE — Plan of Care (Signed)

## 2024-06-30 NOTE — Progress Notes (Signed)
 D/C order noted. Contacted [facility] to be advised of pt's d/c today and that pt should resume care tomorrow. Clinic confirms receiving orders for iv abx from renal PA over the weekend and ready to provide to pt tomorrow with HD.   Randine Mungo Dialysis Navigator [phone]

## 2024-07-01 ENCOUNTER — Ambulatory Visit: Admitting: Physician Assistant

## 2024-07-01 ENCOUNTER — Encounter: Payer: Self-pay | Admitting: Physician Assistant

## 2024-07-01 DIAGNOSIS — Z89512 Acquired absence of left leg below knee: Secondary | ICD-10-CM

## 2024-07-01 DIAGNOSIS — S88112A Complete traumatic amputation at level between knee and ankle, left lower leg, initial encounter: Secondary | ICD-10-CM

## 2024-07-01 NOTE — Progress Notes (Signed)
 Office Visit Note   Patient: [name]           Date of Birth: 1956/06/01           MRN: [id number] Visit Date: 07/01/2024              Requested by: Campbell Reynolds, NP [address] 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   Her and her son came in today after being released form the hospital reporting that the Vac machine keeps alarming.  The son did some trouble shooting an stated even on the drive over it alarmed.    Assessment & Plan: Visit Diagnoses: No diagnosis found.  Plan: We will continue with the vac for now.  The son will keep an eye on it.  If it malfunctions again they will come back in to the office.  They agree with this plan.  Follow-Up Instructions: No follow-ups on file.   Ortho Exam  Patient is alert, oriented, no adenopathy, well-dressed, normal affect, normal respiratory effort. Right BKA vac with good seal.  Fully charged and no alarming.      Imaging: No results found. No images are attached to the encounter.  Labs: Lab Results  Component Value Date   HGBA1C 5.1 05/29/2024   HGBA1C 6.6 (H) 10/28/2023   HGBA1C 6.4 02/28/2021   ESRSEDRATE 17 05/29/2024   ESRSEDRATE 83 (H) 07/25/2011   CRP 4.1 (H) 05/29/2024   CRP 21.9 (H) 04/19/2024   CRP 12.1 (H) 11/10/2023   REPTSTATUS 06/29/2024 FINAL 06/24/2024   GRAMSTAIN NO WBC SEEN NO ORGANISMS SEEN  06/24/2024   CULT  06/24/2024    RARE ENTEROBACTER CLOACAE NO ANAEROBES ISOLATED Performed at [facility], [address], [place], [place] [address]    LABORGA ENTEROBACTER CLOACAE 06/24/2024     Lab Results  Component Value Date   ALBUMIN  1.7 (L) 06/29/2024   ALBUMIN  2.3 (L) 06/24/2024   ALBUMIN  1.8 (L) 06/08/2024    Lab Results  Component Value Date   MG 1.9  05/29/2024   MG 1.8 04/25/2024   MG 2.0 04/21/2024   Lab Results  Component Value Date   VD25OH 26 (L) 04/09/2013   VD25OH 17 (L) 01/13/2013    No results found for: PREALBUMIN    Latest Ref Rng & Units 06/29/2024    2:17 PM 06/27/2024    9:38 AM 06/26/2024    3:59 AM  CBC EXTENDED  WBC 4.0 - 10.5 K/uL 10.7  10.7  8.1   RBC 3.87 - 5.11 MIL/uL 3.54  3.85  2.51   Hemoglobin 12.0 - 15.0 g/dL 9.7  89.5  6.6   HCT 63.9 - 46.0 % 30.8  32.4  21.1   Platelets 150 - 400 K/uL 329  186  145      There is no height or weight on file to calculate BMI.  Orders:  No orders of the defined types were placed in this encounter.  No orders of the defined types were placed in this encounter.    Procedures: No procedures performed  Clinical Data: No additional findings.  ROS:  All other systems negative, except as noted in the HPI. Review of Systems  Objective: Vital Signs: There  were no vitals taken for this visit.  Specialty Comments:  No specialty comments available.  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) 05/30/2024   Hypoalbuminemia due to protein-calorie malnutrition 05/30/2024   Osteomyelitis of vertebra, sacral and sacrococcygeal region (HCC) 05/29/2024   Primary hypercoagulable state 05/29/2024   BKA stump complication (HCC) 05/29/2024   Normocytic anemia 05/29/2024   Does not feel safe at home 05/29/2024   Colostomy in place [facility]) 05/29/2024   Diabetes mellitus with kidney complication, with long-term current use of insulin  (HCC) 05/29/2024   Osteomyelitis (HCC) 04/20/2024   MRSA bacteremia 04/20/2024   Bacteremia 04/20/2024   Necrotizing fasciitis (HCC) 04/20/2024   ESRD (end stage renal disease) (HCC) 04/20/2024   Irritant contact dermatitis associated  with fecal stoma 12/27/2023   Colostomy complication, unspecified (HCC) 12/27/2023   Diverticulitis of large intestine with complication 10/27/2023   Foot callus 02/27/2021   Counseled about COVID-19 virus infection 02/24/2020   Aortic heart murmur 05/15/2019   Thyroid  enlargement 07/19/2014   Vitamin D  deficiency 09/03/2013   Hyperlipidemia 04/24/2013   Diabetic retinopathy (HCC) 01/23/2013   Chronic atrial fibrillation (HCC) 01/15/2013   Aortic atherosclerosis 01/11/2013   H/O non-ST elevation myocardial infarction (NSTEMI) 01/09/2013   History of stroke 01/08/2013   Diabetic neuropathy 01/08/2013   Hypertension    Diabetes mellitus    Past Medical History:  Diagnosis Date   Anemia    Arthritis    Atrial fibrillation (HCC)    CAD (coronary artery disease)    Chronic kidney disease, stage III (moderate) (HCC)    Constipation    Diabetes mellitus (HCC)    ESRD on dialysis (HCC) 04/20/2024   History of blood transfusion    HLD (hyperlipidemia)    Hypertension    Myocardial infarction (HCC)    in April 2014   Septic shock (HCC) 04/19/2024   Wound infection after surgery 03/17/2013    Family History  Problem Relation Age of Onset   Diabetes Mother        No history CAD   Colon cancer Mother 60       died at 73 from CRC   Hypertension Father        Also had CAD   Heart disease Father    Cervical cancer Sister    Diabetes Sister    Congestive Heart Failure Sister    Heart disease Maternal Grandmother    Diabetes Brother    Kidney disease Brother    Sickle cell anemia Son    Thyroid  disease Son    Breast cancer Neg Hx    BRCA 1/2 Neg Hx     Past Surgical History:  Procedure Laterality Date   AMPUTATION Right 04/20/2024   Procedure: AMPUTATION BELOW KNEE;  Surgeon: Harden Jerona GAILS, MD;  Location: [facility] OR;  Service: Orthopedics;  Laterality: Right;   ANKLE SURGERY Right    x 6 total   BREAST BIOPSY Left    15 or 20 yearsa ago she cant remember laterality     HARDWARE REMOVAL Right 02/13/2013   Procedure: HARDWARE REMOVAL;  Surgeon: Jerona GAILS Harden, MD;  Location: [facility] OR;  Service: Orthopedics;  Laterality: Right;  Right Ankle Removal Hardware, Debridement, Place Wound VAC   IR FLUORO GUIDE CV LINE LEFT  11/08/2023   IR  FLUORO GUIDE CV LINE LEFT  04/23/2024   IR FLUORO GUIDE CV LINE RIGHT  11/01/2023   IR FLUORO GUIDE CV LINE RIGHT  11/12/2023   IR REMOVAL TUN CV CATH W/O FL  04/21/2024   IR US  GUIDE VASC ACCESS LEFT  11/08/2023   IR US  GUIDE VASC ACCESS LEFT  04/23/2024   IR US  GUIDE VASC ACCESS RIGHT  11/01/2023   IR US  GUIDE VASC ACCESS RIGHT  11/12/2023   laser surgery for diabetic retinopathy Bilateral    LEFT HEART CATHETERIZATION WITH CORONARY ANGIOGRAM N/A 01/09/2013   Procedure: LEFT HEART CATHETERIZATION WITH CORONARY ANGIOGRAM;  Surgeon: Toribio JONELLE Fuel, MD;  Location: [facility] CATH LAB;  Service: Cardiovascular;  Laterality: N/A;   ORIF ANKLE FRACTURE Right 01/12/2013   Procedure: OPEN REDUCTION INTERNAL FIXATION (ORIF) ANKLE FRACTURE;  Surgeon: Jerona LULLA Sage, MD;  Location: [facility] OR;  Service: Orthopedics;  Laterality: Right;   PARTIAL COLECTOMY N/A 11/02/2023   Procedure: OPEN PARTIAL COLECTOMY WITH END COLOSTOMY;  Surgeon: Dasie Leonor CROME, MD;  Location: [facility] OR;  Service: General;  Laterality: N/A;   PARTIAL HYSTERECTOMY     REVISION AMPUTATION, BELOW THE KNEE Right 06/24/2024   Procedure: REVISION AMPUTATION, BELOW THE KNEE;  Surgeon: Sage Jerona LULLA, MD;  Location: [facility] OR;  Service: Orthopedics;  Laterality: Right;   TUNNELLED CATHETER EXCHANGE N/A 04/15/2024   Procedure: TUNNELLED CATHETER EXCHANGE;  Surgeon: Pearline Norman RAMAN, MD;  Location: [facility];  Service: Cardiovascular;  Laterality: N/A;   Social History   Occupational History   Occupation: Disabled   Occupation: disabled  Tobacco Use   Smoking status: Never   Smokeless tobacco: Never  Vaping Use   Vaping status: Never Used  Substance and Sexual Activity   Alcohol  use: No   Drug use:  No   Sexual activity: Not Currently

## 2024-07-01 NOTE — TOC Transition Note (Signed)
 Transition of Care - Initial Contact after Hospitalization  Date of discharge: 06/30/2024  Date of contact: 07/01/24  Method: Attempted Phone Call Spoke to: No Answer  Patient contacted to discuss transition of care from recent inpatient hospitalization but she didn't answer the phone. Voicemail box was full so unable to leave a message.  Patient returned to her outpatient HD unit on: Wednesday 10/8 at [facility].  Charmaine Piety, NP

## 2024-07-02 ENCOUNTER — Encounter: Admitting: Physician Assistant

## 2024-07-06 ENCOUNTER — Telehealth: Payer: Self-pay | Admitting: Orthopedic Surgery

## 2024-07-06 NOTE — Telephone Encounter (Signed)
 Nena (RN) from [facility] called for wound care order. Pt was admitted in hospital. Please call Nena on secure line at [phone].

## 2024-07-06 NOTE — Telephone Encounter (Signed)
 Pt has a wound vac on and has an appointment with us  tomorrow for removal. Will call after appointment with wound care updates.

## 2024-07-07 ENCOUNTER — Telehealth: Payer: Self-pay | Admitting: Orthopedic Surgery

## 2024-07-07 ENCOUNTER — Ambulatory Visit (INDEPENDENT_AMBULATORY_CARE_PROVIDER_SITE_OTHER): Admitting: Physician Assistant

## 2024-07-07 DIAGNOSIS — T8781 Dehiscence of amputation stump: Secondary | ICD-10-CM

## 2024-07-07 NOTE — Telephone Encounter (Signed)
 Mallie from [facility] called with verbal orders. HHPT 1 wk 8. CB#[phone]

## 2024-07-07 NOTE — Progress Notes (Unsigned)
 Office Visit Note   Patient: [name]           Date of Birth: [birth year]           MRN: [id number] Visit Date: 07/07/2024              Requested by: Campbell Reynolds, NP [address] [place],  [place] [address] PCP: Campbell Reynolds, NP  Chief Complaint  Patient presents with   Right Leg - Routine Post Op    06/24/2024 revision right BKA      HPI: 68 y/o female s/p revision on 06/24/24 of her right BKA after dehiscence of the BKA on 04/20/24. Her and her son came in today after being released form the hospital reporting that the Vac machine keeps alarming. The son did some trouble shooting an stated even on the drive over it alarmed.   She is 13 days from surgery and here for follow up exam.  The vac was removed 2 days ago.  She denies much discomfort and has no drainage.  Her son is with her today.    Assessment & Plan: Visit Diagnoses:  1. Dehiscence of amputation stump of right lower extremity (HCC)     Plan: Black sock with ace wrap compression.  Wash skin with soap and water.  Clean incision and peri wound with Vashe.  F/U in 2 weeks for suture removal.     Follow-Up Instructions: Return in about 2 weeks (around 07/21/2024).   Ortho Exam  Patient is alert, oriented, no adenopathy, well-dressed, normal affect, normal respiratory effort. Sutures intact skin edges approximated viable Right BKA s/p revision.  No cellulitis or fluctuance edema.       Imaging: No results found. No images are attached to the encounter.  Labs: Lab Results  Component Value Date   HGBA1C 5.1 05/29/2024   HGBA1C 6.6 (H) 10/28/2023   HGBA1C 6.4 02/28/2021   ESRSEDRATE 17 05/29/2024   ESRSEDRATE 83 (H) 07/25/2011   CRP 4.1 (H) 05/29/2024   CRP 21.9 (H) 04/19/2024   CRP 12.1 (H) 11/10/2023   REPTSTATUS 06/29/2024 FINAL 06/24/2024   GRAMSTAIN NO WBC SEEN NO ORGANISMS SEEN  06/24/2024   CULT  06/24/2024    RARE ENTEROBACTER CLOACAE NO ANAEROBES ISOLATED Performed at [facility], [address], [place], [place] [address]    LABORGA ENTEROBACTER CLOACAE 06/24/2024     Lab Results  Component Value Date   ALBUMIN  1.7 (L) 06/29/2024   ALBUMIN  2.3 (L) 06/24/2024   ALBUMIN  1.8 (L) 06/08/2024    Lab Results  Component Value Date   MG 1.9 05/29/2024   MG 1.8 04/25/2024   MG 2.0 04/21/2024   Lab Results  Component Value Date   VD25OH 26 (L) 04/09/2013   VD25OH 17 (L) 01/13/2013    No results found for: PREALBUMIN    Latest Ref Rng & Units 06/29/2024    2:17 PM 06/27/2024    9:38 AM 06/26/2024    3:59 AM  CBC EXTENDED  WBC 4.0 - 10.5 K/uL 10.7  10.7  8.1   RBC 3.87 - 5.11 MIL/uL 3.54  3.85  2.51   Hemoglobin 12.0 - 15.0 g/dL 9.7  89.5  6.6   HCT 63.9 - 46.0 % 30.8  32.4  21.1   Platelets 150 - 400 K/uL 329  186  145      There is no height or weight on file to calculate BMI.  Orders:  No orders of the defined  types were placed in this encounter.  No orders of the defined types were placed in this encounter.    Procedures: No procedures performed  Clinical Data: No additional findings.  ROS:  All other systems negative, except as noted in the HPI. Review of Systems  Objective: Vital Signs: There were no vitals taken for this visit.  Specialty Comments:  No specialty comments available.  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) 05/30/2024   Hypoalbuminemia due to protein-calorie malnutrition 05/30/2024   Osteomyelitis of vertebra, sacral and sacrococcygeal region (HCC) 05/29/2024   Primary hypercoagulable state 05/29/2024   BKA stump complication (HCC) 05/29/2024   Normocytic anemia 05/29/2024   Does not feel safe at home 05/29/2024   Colostomy in place [facility]) 05/29/2024   Diabetes mellitus with kidney complication,  with long-term current use of insulin  (HCC) 05/29/2024   Osteomyelitis (HCC) 04/20/2024   MRSA bacteremia 04/20/2024   Bacteremia 04/20/2024   Necrotizing fasciitis (HCC) 04/20/2024   ESRD (end stage renal disease) (HCC) 04/20/2024   Irritant contact dermatitis associated with fecal stoma 12/27/2023   Colostomy complication, unspecified (HCC) 12/27/2023   Diverticulitis of large intestine with complication 10/27/2023   Foot callus 02/27/2021   Counseled about COVID-19 virus infection 02/24/2020   Aortic heart murmur 05/15/2019   Thyroid  enlargement 07/19/2014   Vitamin D  deficiency 09/03/2013   Hyperlipidemia 04/24/2013   Diabetic retinopathy (HCC) 01/23/2013   Chronic atrial fibrillation (HCC) 01/15/2013   Aortic atherosclerosis 01/11/2013   H/O non-ST elevation myocardial infarction (NSTEMI) 01/09/2013   History of stroke 01/08/2013   Diabetic neuropathy 01/08/2013   Hypertension    Diabetes mellitus    Past Medical History:  Diagnosis Date   Anemia    Arthritis    Atrial fibrillation (HCC)    CAD (coronary artery disease)    Chronic kidney disease, stage III (moderate) (HCC)    Constipation    Diabetes mellitus (HCC)    ESRD on dialysis (HCC) 04/20/2024   History of blood transfusion    HLD (hyperlipidemia)    Hypertension    Myocardial infarction (HCC)    in April 2014   Septic shock (HCC) 04/19/2024   Wound infection after surgery 03/17/2013    Family History  Problem Relation Age of Onset   Diabetes Mother        No history CAD   Colon cancer Mother 79       died at 71 from CRC   Hypertension Father        Also had CAD   Heart disease Father    Cervical cancer Sister    Diabetes Sister    Congestive Heart Failure Sister    Heart disease Maternal Grandmother    Diabetes Brother    Kidney disease Brother    Sickle cell anemia Son    Thyroid  disease Son    Breast cancer Neg Hx    BRCA 1/2 Neg Hx     Past Surgical History:  Procedure Laterality Date    AMPUTATION Right 04/20/2024   Procedure: AMPUTATION BELOW KNEE;  Surgeon: Harden Jerona GAILS, MD;  Location: [facility] OR;  Service: Orthopedics;  Laterality: Right;   ANKLE SURGERY Right    x 6 total   BREAST BIOPSY Left    15 or 20 yearsa ago she cant  remember laterality    HARDWARE REMOVAL Right 02/13/2013   Procedure: HARDWARE REMOVAL;  Surgeon: Jerona LULLA Sage, MD;  Location[facility] OR;  Service: Orthopedics;  Laterality: Right;  Right Ankle Removal Hardware, Debridement, Place Wound VAC   IR FLUORO GUIDE CV LINE LEFT  11/08/2023   IR FLUORO GUIDE CV LINE LEFT  04/23/2024   IR FLUORO GUIDE CV LINE RIGHT  11/01/2023   IR FLUORO GUIDE CV LINE RIGHT  11/12/2023   IR REMOVAL TUN CV CATH W/O FL  04/21/2024   IR US  GUIDE VASC ACCESS LEFT  11/08/2023   IR US  GUIDE VASC ACCESS LEFT  04/23/2024   IR US  GUIDE VASC ACCESS RIGHT  11/01/2023   IR US  GUIDE VASC ACCESS RIGHT  11/12/2023   laser surgery for diabetic retinopathy Bilateral    LEFT HEART CATHETERIZATION WITH CORONARY ANGIOGRAM N/A 01/09/2013   Procedure: LEFT HEART CATHETERIZATION WITH CORONARY ANGIOGRAM;  Surgeon: Toribio JONELLE Fuel, MD;  Location: [facility] CATH LAB;  Service: Cardiovascular;  Laterality: N/A;   ORIF ANKLE FRACTURE Right 01/12/2013   Procedure: OPEN REDUCTION INTERNAL FIXATION (ORIF) ANKLE FRACTURE;  Surgeon: Jerona LULLA Sage, MD;  Location: [facility] OR;  Service: Orthopedics;  Laterality: Right;   PARTIAL COLECTOMY N/A 11/02/2023   Procedure: OPEN PARTIAL COLECTOMY WITH END COLOSTOMY;  Surgeon: Dasie Leonor CROME, MD;  Location: [facility] OR;  Service: General;  Laterality: N/A;   PARTIAL HYSTERECTOMY     REVISION AMPUTATION, BELOW THE KNEE Right 06/24/2024   Procedure: REVISION AMPUTATION, BELOW THE KNEE;  Surgeon: Sage Jerona LULLA, MD;  Location: [facility] OR;  Service: Orthopedics;  Laterality: Right;   TUNNELLED CATHETER EXCHANGE N/A 04/15/2024   Procedure: TUNNELLED CATHETER EXCHANGE;  Surgeon: Pearline Norman RAMAN, MD;  Location: [facility];  Service: Cardiovascular;  Laterality:  N/A;   Social History   Occupational History   Occupation: Disabled   Occupation: disabled  Tobacco Use   Smoking status: Never   Smokeless tobacco: Never  Vaping Use   Vaping status: Never Used  Substance and Sexual Activity   Alcohol  use: No   Drug use: No   Sexual activity: Not Currently

## 2024-07-08 ENCOUNTER — Encounter: Payer: Self-pay | Admitting: Physician Assistant

## 2024-07-08 NOTE — Telephone Encounter (Signed)
Called and lm on vm to advise verbal ok for orders as requested below. To call with any questions.

## 2024-07-08 NOTE — Telephone Encounter (Signed)
 SW Nena, gave her VO wound care instructions per OV notes yesterday.

## 2024-07-16 ENCOUNTER — Ambulatory Visit (INDEPENDENT_AMBULATORY_CARE_PROVIDER_SITE_OTHER): Admitting: Internal Medicine

## 2024-07-16 ENCOUNTER — Ambulatory Visit (HOSPITAL_COMMUNITY)

## 2024-07-16 ENCOUNTER — Other Ambulatory Visit: Payer: Self-pay

## 2024-07-16 ENCOUNTER — Ambulatory Visit: Admitting: Vascular Surgery

## 2024-07-16 VITALS — BP 165/80 | HR 75 | Temp 97.8°F | Resp 16

## 2024-07-16 DIAGNOSIS — E0822 Diabetes mellitus due to underlying condition with diabetic chronic kidney disease: Secondary | ICD-10-CM | POA: Diagnosis not present

## 2024-07-16 DIAGNOSIS — N186 End stage renal disease: Secondary | ICD-10-CM

## 2024-07-16 DIAGNOSIS — L98429 Non-pressure chronic ulcer of back with unspecified severity: Secondary | ICD-10-CM | POA: Diagnosis not present

## 2024-07-16 DIAGNOSIS — T8789 Other complications of amputation stump: Secondary | ICD-10-CM | POA: Diagnosis not present

## 2024-07-16 DIAGNOSIS — Z794 Long term (current) use of insulin: Secondary | ICD-10-CM | POA: Diagnosis not present

## 2024-07-16 DIAGNOSIS — Z992 Dependence on renal dialysis: Secondary | ICD-10-CM

## 2024-07-16 NOTE — Progress Notes (Unsigned)
 RFV: follow up for right bka wound dehiscence Patient ID: [name], female   DOB: Oct 12, 1955, 68 y.o.   MRN: [id number]  HPI Finished abtx last week Sees duda on Tuesday Has wound care appt on nov 4th for eval for wound vac  Outpatient Encounter Medications as of 07/16/2024  Medication Sig   ACCU-CHEK AVIVA PLUS test strip CHECK BLOOD GLUCOSE THREE TIMES DAILY   acetaminophen  (TYLENOL ) 500 MG tablet Take 2 tablets (1,000 mg total) by mouth 4 (four) times daily.   albuterol  (PROVENTIL ) (2.5 MG/3ML) 0.083% nebulizer solution Take 3 mLs (2.5 mg total) by nebulization every 2 (two) hours as needed for wheezing.   apixaban  (ELIQUIS ) 5 MG TABS tablet Take 1 tablet (5 mg total) by mouth 2 (two) times daily.   ascorbic acid  (VITAMIN C ) 500 MG tablet Take 1 tablet (500 mg total) by mouth 2 (two) times daily.   Blood Glucose Monitoring Suppl (ACCU-CHEK AVIVA PLUS) w/Device KIT CHECK BLOOD GLUCOSE THREE TIMES DAILY   brimonidine  (ALPHAGAN ) 0.2 % ophthalmic solution Place 1 drop into both eyes 2 (two) times daily.   calcitRIOL  (ROCALTROL ) 0.25 MCG capsule TAKE 1 CAPSULE (0.25 MCG TOTAL) EVERY MONDAY, WEDNESDAY, AND FRIDAY (Patient taking differently: Take 0.25 mcg by mouth in the morning and at bedtime.)   carvedilol  (COREG ) 25 MG tablet Take 25 mg by mouth 2 (two) times daily with a meal.   collagenase  (SANTYL ) 250 UNIT/GM ointment Apply topically 2 (two) times daily.   Darbepoetin Alfa  (ARANESP ) 200 MCG/0.4ML SOSY injection Inject 0.4 mLs (200 mcg total) into the skin every Sunday at 6pm.   DROPLET INSULIN  SYRINGE 31G X 5/16 0.5 ML MISC USE FOUR TIMES DAILY FOR INJECTIONS   HYDROmorphone  (DILAUDID ) 2 MG tablet Take 0.5 tablets (1 mg total) by mouth every 6 (six) hours as needed for severe pain (pain score 7-10) or moderate pain (pain score 4-6).   [Paused] insulin  aspart (NOVOLOG  FLEXPEN) 100 UNIT/ML FlexPen 0-9 Units, Subcutaneous, 3 times daily with meals CBG < 70: Implement  Hypoglycemia measures CBG 70 - 120: 0 units CBG 121 - 150: 1 unit CBG 151 - 200: 2 units CBG 201 - 250: 3 units CBG 251 - 300: 5 units CBG 301 - 350: 7 units CBG 351 - 400: 9 units CBG > 400: call MD   [Paused] insulin  glargine (LANTUS ) 100 UNIT/ML injection Inject 0.07 mLs (7 Units total) into the skin daily. Please schedule appointment before next refill. (Patient taking differently: Inject 7-10 Units into the skin daily. Please schedule appointment before next refill.)   Insulin  Syringes, Disposable, U-100 0.5 ML MISC 4x daily injections   isosorbide  dinitrate (ISORDIL ) 20 MG tablet Take 20 mg by mouth daily.   Lancet Devices (ACCU-CHEK SOFTCLIX) lancets Fill for 1 month for TID testing. Use as instructed   latanoprost  (XALATAN ) 0.005 % ophthalmic solution Place 1 drop into both eyes at bedtime.   leptospermum manuka honey (MEDIHONEY) PSTE paste Apply 1 Application topically 2 (two) times daily. Apply with wound changes IF santyl  not available.   melatonin 5 MG TABS Take 10 mg by mouth at bedtime as needed (for insomnia).   metoprolol  tartrate (LOPRESSOR ) 25 MG tablet Take 1 tablet (25 mg total) by mouth 2 (two) times daily.   midodrine  (PROAMATINE ) 2.5 MG tablet Take 2.5 mg by mouth daily as needed (low bp).   Multiple Vitamin (MULTIVITAMIN WITH MINERALS) TABS Take 1 tablet by mouth daily.   NEEDLE, DISP, 30 G (BD DISP NEEDLES) 30G  X 1/2 MISC For 4x daily injections   [Paused] NOVOLIN R 100 UNIT/ML injection Inject 0-9 Units into the skin in the morning, at noon, and at bedtime. Per sliding scale   nutrition supplement, JUVEN, (JUVEN) PACK Take 1 packet by mouth 2 (two) times daily between meals.   oxyCODONE  (OXY IR/ROXICODONE ) 5 MG immediate release tablet Take 1 tablet (5 mg total) by mouth every 6 (six) hours as needed for moderate pain (pain score 4-6).   zinc  sulfate, 50mg  elemental zinc , 220 (50 Zn) MG capsule Take 1 capsule (220 mg total) by mouth daily.   No facility-administered  encounter medications on file as of 07/16/2024.     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) 05/30/2024   Hypoalbuminemia due to protein-calorie malnutrition 05/30/2024   Osteomyelitis of vertebra, sacral and sacrococcygeal region (HCC) 05/29/2024   Primary hypercoagulable state 05/29/2024   BKA stump complication (HCC) 05/29/2024   Normocytic anemia 05/29/2024   Does not feel safe at home 05/29/2024   Colostomy in place [facility]) 05/29/2024   Diabetes mellitus with kidney complication, with long-term current use of insulin  (HCC) 05/29/2024   Osteomyelitis (HCC) 04/20/2024   MRSA bacteremia 04/20/2024   Bacteremia 04/20/2024   Necrotizing fasciitis (HCC) 04/20/2024   ESRD (end stage renal disease) (HCC) 04/20/2024   Irritant contact dermatitis associated with fecal stoma 12/27/2023   Colostomy complication, unspecified (HCC) 12/27/2023   Diverticulitis of large intestine with complication 10/27/2023   Foot callus 02/27/2021   Counseled about COVID-19 virus infection 02/24/2020   Aortic heart murmur 05/15/2019   Thyroid  enlargement 07/19/2014   Vitamin D  deficiency 09/03/2013   Hyperlipidemia 04/24/2013   Diabetic retinopathy (HCC) 01/23/2013   Chronic atrial fibrillation (HCC) 01/15/2013   Aortic atherosclerosis 01/11/2013   H/O non-ST elevation myocardial infarction (NSTEMI) 01/09/2013   History of stroke 01/08/2013   Diabetic neuropathy 01/08/2013   Hypertension    Diabetes mellitus      Health Maintenance Due  Topic Date Due   Medicare Annual Wellness (AWV)  Never done   Zoster Vaccines- Shingrix (1 of 2) 10/17/1974   DEXA SCAN  Never done   FOOT EXAM  06/16/2021   OPHTHALMOLOGY EXAM  06/22/2022   DTaP/Tdap/Td (2 - Td or Tdap) 05/27/2023   Influenza Vaccine  04/24/2024    Colonoscopy  05/19/2024   COVID-19 Vaccine (4 - 2025-26 season) 05/25/2024     Review of Systems  Physical Exam   BP (!) 165/80   Pulse 75   Temp 97.8 F (36.6 C) (Oral)   Resp 16   SpO2 100%    Right bka well healed. Sutures still in place. No fluctuance or drainage CBC Lab Results  Component Value Date   WBC 10.7 (H) 06/29/2024   RBC 3.54 (L) 06/29/2024   HGB 9.7 (L) 06/29/2024   HCT 30.8 (L) 06/29/2024   PLT 329 06/29/2024   MCV 87.0 06/29/2024   MCH 27.4 06/29/2024   MCHC 31.5 06/29/2024   RDW 19.9 (H) 06/29/2024   LYMPHSABS 1.3 06/24/2024   MONOABS 1.2 (H) 06/24/2024   EOSABS 0.2 06/24/2024    BMET Lab Results  Component Value Date   NA 133 (L) 06/29/2024   K 4.0 06/29/2024   CL 95 (L) 06/29/2024   CO2 24 06/29/2024   GLUCOSE 122 (H) 06/29/2024  BUN 53 (H) 06/29/2024   CREATININE 6.52 (H) 06/29/2024   CALCIUM  8.6 (L) 06/29/2024   GFRNONAA 6 (L) 06/29/2024   GFRAA 21 (L) 12/21/2019    Lab Results  Component Value Date   ESRSEDRATE 17 05/29/2024   Lab Results  Component Value Date   CRP 4.1 (H) 05/29/2024     Assessment and Plan  Will check sed rate and crp. If elevated may need to consider restarting iv abtx through [facility], [facility] on [address]  Stage 4 wound = continue with wound care, and eval for wound vac

## 2024-07-17 LAB — SEDIMENTATION RATE: Sed Rate: 9 mm/h (ref 0–30)

## 2024-07-17 LAB — C-REACTIVE PROTEIN: CRP: 5.8 mg/L (ref <8.0)

## 2024-07-21 ENCOUNTER — Encounter: Admitting: Physician Assistant

## 2024-07-23 ENCOUNTER — Ambulatory Visit (INDEPENDENT_AMBULATORY_CARE_PROVIDER_SITE_OTHER): Admitting: Physician Assistant

## 2024-07-23 ENCOUNTER — Encounter (HOSPITAL_COMMUNITY): Payer: Self-pay | Admitting: Surgery

## 2024-07-23 ENCOUNTER — Other Ambulatory Visit: Payer: Self-pay

## 2024-07-23 ENCOUNTER — Encounter (HOSPITAL_COMMUNITY)
Admission: RE | Disposition: A | Discharge status: Home / Self Care | Payer: Self-pay | Source: Home / Self Care | Attending: Surgery

## 2024-07-23 ENCOUNTER — Ambulatory Visit (HOSPITAL_COMMUNITY)
Admission: RE | Admit: 2024-07-23 | Discharge: 2024-07-23 | Disposition: A | Discharge status: Home / Self Care | Attending: Surgery | Admitting: Surgery

## 2024-07-23 DIAGNOSIS — Z992 Dependence on renal dialysis: Secondary | ICD-10-CM | POA: Diagnosis not present

## 2024-07-23 DIAGNOSIS — I120 Hypertensive chronic kidney disease with stage 5 chronic kidney disease or end stage renal disease: Secondary | ICD-10-CM | POA: Diagnosis not present

## 2024-07-23 DIAGNOSIS — T8781 Dehiscence of amputation stump: Secondary | ICD-10-CM

## 2024-07-23 DIAGNOSIS — Y839 Surgical procedure, unspecified as the cause of abnormal reaction of the patient, or of later complication, without mention of misadventure at the time of the procedure: Secondary | ICD-10-CM | POA: Diagnosis not present

## 2024-07-23 DIAGNOSIS — N186 End stage renal disease: Secondary | ICD-10-CM | POA: Diagnosis not present

## 2024-07-23 DIAGNOSIS — E1122 Type 2 diabetes mellitus with diabetic chronic kidney disease: Secondary | ICD-10-CM | POA: Insufficient documentation

## 2024-07-23 DIAGNOSIS — T8249XA Other complication of vascular dialysis catheter, initial encounter: Secondary | ICD-10-CM | POA: Insufficient documentation

## 2024-07-23 HISTORY — PX: TUNNELLED CATHETER EXCHANGE: CATH118373

## 2024-07-23 LAB — POCT I-STAT, CHEM 8
BUN: 22 mg/dL (ref 8–23)
Calcium, Ion: 1.25 mmol/L (ref 1.15–1.40)
Chloride: 101 mmol/L (ref 98–111)
Creatinine, Ser: 8.2 mg/dL — ABNORMAL HIGH (ref 0.44–1.00)
Glucose, Bld: 122 mg/dL — ABNORMAL HIGH (ref 70–99)
HCT: 35 % — ABNORMAL LOW (ref 36.0–46.0)
Hemoglobin: 11.9 g/dL — ABNORMAL LOW (ref 12.0–15.0)
Potassium: 5 mmol/L (ref 3.5–5.1)
Sodium: 135 mmol/L (ref 135–145)
TCO2: 25 mmol/L (ref 22–32)

## 2024-07-23 LAB — GLUCOSE, CAPILLARY
Glucose-Capillary: 68 mg/dL — ABNORMAL LOW (ref 70–99)
Glucose-Capillary: 75 mg/dL (ref 70–99)

## 2024-07-23 MED ORDER — LIDOCAINE HCL (PF) 1 % IJ SOLN
INTRAMUSCULAR | Status: AC
  Filled 2024-07-23: qty 30

## 2024-07-23 MED ORDER — LIDOCAINE HCL (PF) 1 % IJ SOLN
INTRAMUSCULAR | Status: DC | PRN
  Administered 2024-07-23: 15 mL

## 2024-07-23 MED ORDER — HEPARIN SODIUM (PORCINE) 1000 UNIT/ML IJ SOLN
INTRAMUSCULAR | Status: DC | PRN
Start: 2024-07-23 — End: 2024-07-23
  Administered 2024-07-23 (×2): 1900 [IU] via INTRAVENOUS

## 2024-07-23 MED ORDER — HEPARIN SODIUM (PORCINE) 1000 UNIT/ML IJ SOLN
INTRAMUSCULAR | Status: AC
  Filled 2024-07-23: qty 10

## 2024-07-23 MED ORDER — HEPARIN (PORCINE) IN NACL 1000-0.9 UT/500ML-% IV SOLN
INTRAVENOUS | Status: DC | PRN
  Administered 2024-07-23: 500 mL

## 2024-07-23 NOTE — Progress Notes (Addendum)
 Office Visit Note   Patient: [name]           Date of Birth: [birth year]           MRN: [id number] Visit Date: 07/23/2024              Requested by: Campbell Reynolds, NP [address] [place],  [place] [address] PCP: Campbell Reynolds, NP  Chief Complaint  Patient presents with   Right Leg - Routine Post Op    06/24/2024 revision right BKA      HPI: 68 y/o female s/p revision on 06/24/24 of her right BKA after dehiscence of the BKA on 04/20/24.  Black sock with ace wrap compression. Wash skin with soap and water. Clean incision and peri wound with Vashe.   Assessment & Plan: Visit Diagnoses: No diagnosis found.  Plan: Sutures were remove today she tolerated this well.  Dry guaze and XL stump sock was placed over the stump.  She was given a [facility] prescription for prothesis and supply's.  I will order PT for gait training to start when she gets her prothesis.    Follow-Up Instructions: No follow-ups on file.   Ortho Exam  Patient is alert, oriented, no adenopathy, well-dressed, normal affect, normal respiratory effort. Left BKA well healed.  No drainage, cellulitis and minimal edema.  Stump is warm to touch and appears viable.    Patient is a new right transtibial  amputee.  Patient's current comorbidities are not expected to impact the ability to function with the prescribed prosthesis. Patient verbally communicates a strong desire to use a prosthesis. Patient currently requires mobility aids to ambulate without a prosthesis.  Expects not to use mobility aids with a new prosthesis. Patient is expected to resume or reach their K Level within 6 months. Patient was active before the amputation and independent with stairs, uneven terrain, varying cadence, and a community ambulator.  Patient is a K2 level ambulator that will use a prosthesis to walk around their home and the community over low level environmental barriers.   She would benefit from a Hydraulic  foot/ankle.      Imaging:     Labs: Lab Results  Component Value Date   HGBA1C 5.1 05/29/2024   HGBA1C 6.6 (H) 10/28/2023   HGBA1C 6.4 02/28/2021   ESRSEDRATE 9 07/16/2024   ESRSEDRATE 17 05/29/2024   ESRSEDRATE 83 (H) 07/25/2011   CRP 5.8 07/16/2024   CRP 4.1 (H) 05/29/2024   CRP 21.9 (H) 04/19/2024   REPTSTATUS 06/29/2024 FINAL 06/24/2024   GRAMSTAIN NO WBC SEEN NO ORGANISMS SEEN  06/24/2024   CULT  06/24/2024    RARE ENTEROBACTER CLOACAE NO ANAEROBES ISOLATED Performed at [facility], [address], [place], [place] [address]    LABORGA ENTEROBACTER CLOACAE 06/24/2024     Lab Results  Component Value Date   ALBUMIN  1.7 (L) 06/29/2024   ALBUMIN  2.3 (L) 06/24/2024   ALBUMIN  1.8 (L) 06/08/2024    Lab Results  Component Value Date   MG 1.9 05/29/2024   MG 1.8 04/25/2024   MG 2.0 04/21/2024   Lab Results  Component Value Date   VD25OH 26 (L) 04/09/2013   VD25OH 17 (L) 01/13/2013    No results found for: PREALBUMIN    Latest Ref Rng & Units 07/23/2024   11:19 AM 06/29/2024    2:17 PM 06/27/2024    9:38 AM  CBC EXTENDED  WBC 4.0 - 10.5 K/uL  10.7  10.7  RBC 3.87 - 5.11 MIL/uL  3.54  3.85   Hemoglobin 12.0 - 15.0 g/dL 88.0  9.7  89.5   HCT 63.9 - 46.0 % 35.0  30.8  32.4   Platelets 150 - 400 K/uL  329  186      There is no height or weight on file to calculate BMI.  Orders:  No orders of the defined types were placed in this encounter.  No orders of the defined types were placed in this encounter.    Procedures: No procedures performed  Clinical Data: No additional findings.  ROS:  All other systems negative, except as noted in the HPI. Review of Systems  Objective: Vital Signs: There were no vitals taken for this visit.  Specialty Comments:  No specialty comments available.  PMFS History: Patient Active Problem List   Diagnosis Date Noted   Non-healing wound of amputation stump (HCC) 06/24/2024   Wound dehiscence  06/04/2024   Severe protein-calorie malnutrition 06/03/2024   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) 05/30/2024   Hypoalbuminemia due to protein-calorie malnutrition 05/30/2024   Osteomyelitis of vertebra, sacral and sacrococcygeal region (HCC) 05/29/2024   Primary hypercoagulable state 05/29/2024   BKA stump complication (HCC) 05/29/2024   Normocytic anemia 05/29/2024   Does not feel safe at home 05/29/2024   Colostomy in place [facility]) 05/29/2024   Diabetes mellitus with kidney complication, with long-term current use of insulin  (HCC) 05/29/2024   Osteomyelitis (HCC) 04/20/2024   MRSA bacteremia 04/20/2024   Bacteremia 04/20/2024   Necrotizing fasciitis (HCC) 04/20/2024   ESRD (end stage renal disease) (HCC) 04/20/2024   Irritant contact dermatitis associated with fecal stoma 12/27/2023   Colostomy complication, unspecified (HCC) 12/27/2023   Diverticulitis of large intestine with complication 10/27/2023   Foot callus 02/27/2021   Counseled about COVID-19 virus infection 02/24/2020   Aortic heart murmur 05/15/2019   Thyroid  enlargement 07/19/2014   Vitamin D  deficiency 09/03/2013   Hyperlipidemia 04/24/2013   Diabetic retinopathy (HCC) 01/23/2013   Chronic atrial fibrillation (HCC) 01/15/2013   Aortic atherosclerosis 01/11/2013   H/O non-ST elevation myocardial infarction (NSTEMI) 01/09/2013   History of stroke 01/08/2013   Diabetic neuropathy 01/08/2013   Hypertension    Diabetes mellitus    Past Medical History:  Diagnosis Date   Anemia    Arthritis    Atrial fibrillation (HCC)    CAD (coronary artery disease)    Chronic kidney disease, stage III (moderate) (HCC)    Constipation    Diabetes mellitus (HCC)    ESRD on dialysis (HCC) 04/20/2024   History of blood transfusion    HLD (hyperlipidemia)    Hypertension    Myocardial infarction (HCC)    in April 2014   Septic shock (HCC) 04/19/2024   Wound infection after surgery 03/17/2013    Family History  Problem  Relation Age of Onset   Diabetes Mother        No history CAD   Colon cancer Mother 70       died at 71 from CRC   Hypertension Father        Also had CAD   Heart disease Father    Cervical cancer Sister    Diabetes Sister    Congestive Heart Failure Sister    Heart disease Maternal Grandmother    Diabetes Brother    Kidney disease Brother    Sickle cell anemia Son    Thyroid  disease Son    Breast cancer Neg Hx  BRCA 1/2 Neg Hx     Past Surgical History:  Procedure Laterality Date   AMPUTATION Right 04/20/2024   Procedure: AMPUTATION BELOW KNEE;  Surgeon: Harden Jerona GAILS, MD;  Location: [facility] OR;  Service: Orthopedics;  Laterality: Right;   ANKLE SURGERY Right    x 6 total   BREAST BIOPSY Left    15 or 20 yearsa ago she cant remember laterality    HARDWARE REMOVAL Right 02/13/2013   Procedure: HARDWARE REMOVAL;  Surgeon: Jerona GAILS Harden, MD;  Location: [facility] OR;  Service: Orthopedics;  Laterality: Right;  Right Ankle Removal Hardware, Debridement, Place Wound VAC   IR FLUORO GUIDE CV LINE LEFT  11/08/2023   IR FLUORO GUIDE CV LINE LEFT  04/23/2024   IR FLUORO GUIDE CV LINE RIGHT  11/01/2023   IR FLUORO GUIDE CV LINE RIGHT  11/12/2023   IR REMOVAL TUN CV CATH W/O FL  04/21/2024   IR US  GUIDE VASC ACCESS LEFT  11/08/2023   IR US  GUIDE VASC ACCESS LEFT  04/23/2024   IR US  GUIDE VASC ACCESS RIGHT  11/01/2023   IR US  GUIDE VASC ACCESS RIGHT  11/12/2023   laser surgery for diabetic retinopathy Bilateral    LEFT HEART CATHETERIZATION WITH CORONARY ANGIOGRAM N/A 01/09/2013   Procedure: LEFT HEART CATHETERIZATION WITH CORONARY ANGIOGRAM;  Surgeon: Toribio JONELLE Fuel, MD;  Location: [facility] CATH LAB;  Service: Cardiovascular;  Laterality: N/A;   ORIF ANKLE FRACTURE Right 01/12/2013   Procedure: OPEN REDUCTION INTERNAL FIXATION (ORIF) ANKLE FRACTURE;  Surgeon: Jerona GAILS Harden, MD;  Location: [facility] OR;  Service: Orthopedics;  Laterality: Right;   PARTIAL COLECTOMY N/A 11/02/2023   Procedure: OPEN PARTIAL COLECTOMY  WITH END COLOSTOMY;  Surgeon: Dasie Leonor CROME, MD;  Location: [facility] OR;  Service: General;  Laterality: N/A;   PARTIAL HYSTERECTOMY     REVISION AMPUTATION, BELOW THE KNEE Right 06/24/2024   Procedure: REVISION AMPUTATION, BELOW THE KNEE;  Surgeon: Harden Jerona GAILS, MD;  Location: [facility] OR;  Service: Orthopedics;  Laterality: Right;   TUNNELLED CATHETER EXCHANGE N/A 04/15/2024   Procedure: TUNNELLED CATHETER EXCHANGE;  Surgeon: Pearline Norman RAMAN, MD;  Location: [facility] PV LAB;  Service: Cardiovascular;  Laterality: N/A;   Social History   Occupational History   Occupation: Disabled   Occupation: disabled  Tobacco Use   Smoking status: Never   Smokeless tobacco: Never  Vaping Use   Vaping status: Never Used  Substance and Sexual Activity   Alcohol  use: No   Drug use: No   Sexual activity: Not Currently

## 2024-07-23 NOTE — Op Note (Signed)
    Patient name: [name] MRN: [id number] DOB: [birth year] Sex: female  07/23/2024 Pre-operative Diagnosis: ESRD Post-operative diagnosis:  Same Surgeon:  Malvina New Procedure:   Tunneled dialysis catheter exchange under fluoroscopic guidance Indications: This is a 68 year old female with a nonfunctioning dialysis catheter who comes in today for exchange  Procedure:  The patient was identified in the holding area and taken to [facility] 1  The patient was then placed supine on the table. local anesthesia was administered.  The patient was prepped and draped in the usual sterile fashion.  A time out was called and antibiotics were administered.  A Glidewire advantage was inserted through the catheter after the heparin  had been removed.  The wire went into the inferior vena cava without difficulty.  I then fully mobilized the cuff and removed the catheter.  I felt that the catheter was too short as it had buckled.  The existing catheter was a 19 and so I upsized to a 23.  This was then inserted over the wire with the dilators in place.  To have the catheter situated where it was and at risk for flipping up into the vein, I had to put the cuff a little higher than normal this forces distance from the skin.  Both ports flushed and aspirated without difficulty feels the appropriate volume to heparin  sutured to the skin with 2-0 nylon.  The appropriate volumes of heparin  were placed    V. Malvina New, M.D., [facility] [place] Office: [phone] Pager:  [phone]

## 2024-07-23 NOTE — H&P (Signed)
 [facility] [place]  Patient name: [name] MRN: [id number] DOB: Apr 26, 1956 Sex: female   REASON FOR VISIT:    ESRD  HISOTRY OF PRESENT ILLNESS:    [name] is a 68 y.o. female who underwent tunneled catheter placement on 04/15/2024.  The catheter is not functioning.  She has been evaluated for permanent access in the past by Dr. Laurence.  She is recovering from necrotizing fasciitis and osteomyelitis of the right leg which necessitated amputation.  She also had perforated diverticulitis earlier this year   PAST MEDICAL HISTORY:   Past Medical History:  Diagnosis Date   Anemia    Arthritis    Atrial fibrillation (HCC)    CAD (coronary artery disease)    Chronic kidney disease, stage III (moderate) (HCC)    Constipation    Diabetes mellitus (HCC)    ESRD on dialysis (HCC) 04/20/2024   History of blood transfusion    HLD (hyperlipidemia)    Hypertension    Myocardial infarction (HCC)    in April 2014   Septic shock (HCC) 04/19/2024   Wound infection after surgery 03/17/2013     FAMILY HISTORY:   Family History  Problem Relation Age of Onset   Diabetes Mother        No history CAD   Colon cancer Mother 66       died at 70 from CRC   Hypertension Father        Also had CAD   Heart disease Father    Cervical cancer Sister    Diabetes Sister    Congestive Heart Failure Sister    Heart disease Maternal Grandmother    Diabetes Brother    Kidney disease Brother    Sickle cell anemia Son    Thyroid  disease Son    Breast cancer Neg Hx    BRCA 1/2 Neg Hx     SOCIAL HISTORY:   Social History   Tobacco Use   Smoking status: Never   Smokeless tobacco: Never  Substance Use Topics   Alcohol  use: No     ALLERGIES:   Allergies  Allergen Reactions   Nsaids Other (See Comments)    CKD stage 4   Lisinopril Other (See Comments)    coughing   Peanut-Containing Drug Products Itching and  Other (See Comments)    GI intolerance - diarrhea     CURRENT MEDICATIONS:   No current facility-administered medications for this encounter.    REVIEW OF SYSTEMS:   [X]  denotes positive finding, [ ]  denotes negative finding Cardiac  Comments:  Chest pain or chest pressure:    Shortness of breath upon exertion:    Short of breath when lying flat:    Irregular heart rhythm:        Vascular    Pain in calf, thigh, or hip brought on by ambulation:    Pain in feet at night that wakes you up from your sleep:     Blood clot in your veins:    Leg swelling:         Pulmonary    Oxygen at home:    Productive cough:     Wheezing:         Neurologic    Sudden weakness in arms or legs:     Sudden numbness in arms or legs:     Sudden onset of difficulty speaking or slurred speech:    Temporary loss of vision in one eye:  Problems with dizziness:         Gastrointestinal    Blood in stool:     Vomited blood:         Genitourinary    Burning when urinating:     Blood in urine:        Psychiatric    Major depression:         Hematologic    Bleeding problems:    Problems with blood clotting too easily:        Skin    Rashes or ulcers:        Constitutional    Fever or chills:      PHYSICAL EXAM:   Vitals:   07/23/24 0952 07/23/24 1000  BP: (!) 173/76   Pulse: 70   Resp: 12   Temp: 97.8 F (36.6 C)   TempSrc: Oral   SpO2: 98%   Weight:  61.2 kg  Height:  5' 7 (1.702 m)    GENERAL: The patient is a well-nourished female, in no acute distress. The vital signs are documented above. CARDIAC: There is a regular rate and rhythm.  PULMONARY: Non-labored respirations MUSCULOSKELETAL: There are no major deformities or cyanosis. NEUROLOGIC: No focal weakness or paresthesias are detected. SKIN: There are no ulcers or rashes noted. PSYCHIATRIC: The patient has a normal affect.  STUDIES:     MEDICAL ISSUES:   ESRD: I discussed proceeding with tunneled  dialysis catheter exchange.  She will need to be evaluated for permanent access in the near future    Malvina New, IV, MD, FACS [facility] [phone] Pager [phone]

## 2024-07-27 ENCOUNTER — Encounter: Payer: Self-pay | Admitting: Radiology

## 2024-07-28 ENCOUNTER — Encounter (HOSPITAL_BASED_OUTPATIENT_CLINIC_OR_DEPARTMENT_OTHER): Attending: General Surgery | Admitting: General Surgery

## 2024-07-28 DIAGNOSIS — E11622 Type 2 diabetes mellitus with other skin ulcer: Secondary | ICD-10-CM | POA: Insufficient documentation

## 2024-07-28 DIAGNOSIS — E43 Unspecified severe protein-calorie malnutrition: Secondary | ICD-10-CM | POA: Insufficient documentation

## 2024-07-28 DIAGNOSIS — N186 End stage renal disease: Secondary | ICD-10-CM | POA: Insufficient documentation

## 2024-07-28 DIAGNOSIS — E1122 Type 2 diabetes mellitus with diabetic chronic kidney disease: Secondary | ICD-10-CM | POA: Insufficient documentation

## 2024-07-28 DIAGNOSIS — L89154 Pressure ulcer of sacral region, stage 4: Secondary | ICD-10-CM | POA: Insufficient documentation

## 2024-07-28 DIAGNOSIS — M8660 Other chronic osteomyelitis, unspecified site: Secondary | ICD-10-CM | POA: Insufficient documentation

## 2024-08-06 ENCOUNTER — Encounter (HOSPITAL_BASED_OUTPATIENT_CLINIC_OR_DEPARTMENT_OTHER): Admitting: General Surgery

## 2024-08-13 ENCOUNTER — Encounter (HOSPITAL_BASED_OUTPATIENT_CLINIC_OR_DEPARTMENT_OTHER): Admitting: General Surgery

## 2024-08-18 ENCOUNTER — Telehealth: Payer: Self-pay

## 2024-08-18 NOTE — Telephone Encounter (Signed)
 Can you please amend your last note to include k2 notes/ tenents and the hydraulic foot please?

## 2024-08-27 ENCOUNTER — Ambulatory Visit (HOSPITAL_COMMUNITY)

## 2024-08-27 ENCOUNTER — Encounter (HOSPITAL_BASED_OUTPATIENT_CLINIC_OR_DEPARTMENT_OTHER): Attending: General Surgery | Admitting: General Surgery

## 2024-08-27 ENCOUNTER — Ambulatory Visit (HOSPITAL_COMMUNITY): Admission: RE | Admit: 2024-08-27 | Source: Ambulatory Visit

## 2024-08-27 ENCOUNTER — Ambulatory Visit: Admitting: Vascular Surgery

## 2024-08-27 DIAGNOSIS — N186 End stage renal disease: Secondary | ICD-10-CM | POA: Insufficient documentation

## 2024-08-27 DIAGNOSIS — M8660 Other chronic osteomyelitis, unspecified site: Secondary | ICD-10-CM | POA: Diagnosis not present

## 2024-08-27 DIAGNOSIS — E11622 Type 2 diabetes mellitus with other skin ulcer: Secondary | ICD-10-CM | POA: Insufficient documentation

## 2024-08-27 DIAGNOSIS — E1122 Type 2 diabetes mellitus with diabetic chronic kidney disease: Secondary | ICD-10-CM | POA: Diagnosis not present

## 2024-08-27 DIAGNOSIS — L89154 Pressure ulcer of sacral region, stage 4: Secondary | ICD-10-CM | POA: Diagnosis not present

## 2024-09-03 ENCOUNTER — Ambulatory Visit: Admitting: Internal Medicine

## 2024-09-03 ENCOUNTER — Other Ambulatory Visit: Payer: Self-pay

## 2024-09-03 ENCOUNTER — Encounter (HOSPITAL_BASED_OUTPATIENT_CLINIC_OR_DEPARTMENT_OTHER): Admitting: Internal Medicine

## 2024-09-22 ENCOUNTER — Encounter (HOSPITAL_BASED_OUTPATIENT_CLINIC_OR_DEPARTMENT_OTHER): Admitting: General Surgery

## 2024-09-22 DIAGNOSIS — E11622 Type 2 diabetes mellitus with other skin ulcer: Secondary | ICD-10-CM | POA: Diagnosis not present

## 2024-09-22 DIAGNOSIS — N186 End stage renal disease: Secondary | ICD-10-CM

## 2024-09-25 ENCOUNTER — Emergency Department (HOSPITAL_COMMUNITY): Admission: EM | Admit: 2024-09-25 | Discharge: 2024-09-25 | Disposition: A | Discharge status: Home / Self Care

## 2024-09-25 ENCOUNTER — Other Ambulatory Visit: Payer: Self-pay

## 2024-09-25 DIAGNOSIS — I959 Hypotension, unspecified: Secondary | ICD-10-CM | POA: Diagnosis present

## 2024-09-25 DIAGNOSIS — Z794 Long term (current) use of insulin: Secondary | ICD-10-CM | POA: Diagnosis not present

## 2024-09-25 DIAGNOSIS — E1165 Type 2 diabetes mellitus with hyperglycemia: Secondary | ICD-10-CM | POA: Insufficient documentation

## 2024-09-25 DIAGNOSIS — I2510 Atherosclerotic heart disease of native coronary artery without angina pectoris: Secondary | ICD-10-CM | POA: Insufficient documentation

## 2024-09-25 DIAGNOSIS — R519 Headache, unspecified: Secondary | ICD-10-CM | POA: Diagnosis not present

## 2024-09-25 DIAGNOSIS — N186 End stage renal disease: Secondary | ICD-10-CM | POA: Diagnosis not present

## 2024-09-25 DIAGNOSIS — Z79899 Other long term (current) drug therapy: Secondary | ICD-10-CM | POA: Insufficient documentation

## 2024-09-25 DIAGNOSIS — E1122 Type 2 diabetes mellitus with diabetic chronic kidney disease: Secondary | ICD-10-CM | POA: Diagnosis not present

## 2024-09-25 DIAGNOSIS — I120 Hypertensive chronic kidney disease with stage 5 chronic kidney disease or end stage renal disease: Secondary | ICD-10-CM | POA: Diagnosis not present

## 2024-09-25 DIAGNOSIS — Z992 Dependence on renal dialysis: Secondary | ICD-10-CM | POA: Insufficient documentation

## 2024-09-25 DIAGNOSIS — Z89511 Acquired absence of right leg below knee: Secondary | ICD-10-CM | POA: Diagnosis not present

## 2024-09-25 DIAGNOSIS — Z7901 Long term (current) use of anticoagulants: Secondary | ICD-10-CM | POA: Insufficient documentation

## 2024-09-25 LAB — CBC WITH DIFFERENTIAL/PLATELET
Basophils Absolute: 0.3 K/uL — ABNORMAL HIGH (ref 0.0–0.1)
Basophils Relative: 3 %
Eosinophils Absolute: 0.4 K/uL (ref 0.0–0.5)
Eosinophils Relative: 5 %
HCT: 31.2 % — ABNORMAL LOW (ref 36.0–46.0)
Hemoglobin: 9.5 g/dL — ABNORMAL LOW (ref 12.0–15.0)
Lymphocytes Relative: 17 %
Lymphs Abs: 1.4 K/uL (ref 0.7–4.0)
MCH: 26.4 pg (ref 26.0–34.0)
MCHC: 30.4 g/dL (ref 30.0–36.0)
MCV: 86.7 fL (ref 80.0–100.0)
Monocytes Absolute: 0.3 K/uL (ref 0.1–1.0)
Monocytes Relative: 4 %
Neutro Abs: 6 K/uL (ref 1.7–7.7)
Neutrophils Relative %: 71 %
Platelets: 197 K/uL (ref 150–400)
RBC: 3.6 MIL/uL — ABNORMAL LOW (ref 3.87–5.11)
RDW: 21.3 % — ABNORMAL HIGH (ref 11.5–15.5)
WBC: 8.5 K/uL (ref 4.0–10.5)
nRBC: 0 % (ref 0.0–0.2)

## 2024-09-25 LAB — CBG MONITORING, ED: Glucose-Capillary: 135 mg/dL — ABNORMAL HIGH (ref 70–99)

## 2024-09-25 LAB — COMPREHENSIVE METABOLIC PANEL WITH GFR
ALT: 14 U/L (ref 0–44)
AST: 18 U/L (ref 15–41)
Albumin: 3.1 g/dL — ABNORMAL LOW (ref 3.5–5.0)
Alkaline Phosphatase: 67 U/L (ref 38–126)
Anion gap: 10 (ref 5–15)
BUN: 28 mg/dL — ABNORMAL HIGH (ref 8–23)
CO2: 26 mmol/L (ref 22–32)
Calcium: 8.6 mg/dL — ABNORMAL LOW (ref 8.9–10.3)
Chloride: 101 mmol/L (ref 98–111)
Creatinine, Ser: 5.8 mg/dL — ABNORMAL HIGH (ref 0.44–1.00)
GFR, Estimated: 7 mL/min — ABNORMAL LOW
Glucose, Bld: 144 mg/dL — ABNORMAL HIGH (ref 70–99)
Potassium: 5 mmol/L (ref 3.5–5.1)
Sodium: 137 mmol/L (ref 135–145)
Total Bilirubin: 0.3 mg/dL (ref 0.0–1.2)
Total Protein: 6.9 g/dL (ref 6.5–8.1)

## 2024-09-25 LAB — LIPASE, BLOOD: Lipase: 31 U/L (ref 11–51)

## 2024-09-25 MED ORDER — ACETAMINOPHEN 500 MG PO TABS
1000.0000 mg | ORAL_TABLET | Freq: Once | ORAL | Status: AC
  Administered 2024-09-25: 1000 mg via ORAL
  Filled 2024-09-25: qty 2

## 2024-09-25 MED ORDER — MIDODRINE HCL 5 MG PO TABS
2.5000 mg | ORAL_TABLET | Freq: Every day | ORAL | Status: DC | PRN
  Administered 2024-09-25: 2.5 mg via ORAL
  Filled 2024-09-25: qty 1

## 2024-09-25 NOTE — ED Triage Notes (Signed)
 BIB [facility] from home with complaints of hypotension and hyperglycemia. Pt did have dialysis today and states they pulled off a little more than normal but still only got 3L off.  Pt does have a wound vac and has an RBKA. AOX4

## 2024-09-25 NOTE — Discharge Instructions (Addendum)
 Evaluation today for your low blood pressure was overall reassuring.  Suspect that this is likely secondary to volume losses during dialysis.  Clinically did not appear dehydrated.  Recommend that you continue going to your dialysis as you normally do.  You can take midodrine  as needed for low blood pressure at home.  If you develop a fever, chest pain, shortness of breath, lethargy or any other concerning symptom please return to ED for further evaluation.

## 2024-09-25 NOTE — ED Provider Notes (Signed)
 " [facility] Provider Note   CSN: [id number] Arrival date & time: 09/25/24  2013     Patient presents with: No chief complaint on file.  HPI [name] is a 69 y.o. female with ESRD, sacral wound with indwelling wound VAC, diabetes, CAD, HTN presenting for hypotension.  She states she underwent dialysis today but they pulled off about 3 L of fluid.  She states they normally pull 1 to 1.5 L.  She denies chest pain, shortness of breath, fever, abdominal pain, vomiting, and cough or nasal congestion.  Was admitted for perforated diverticulitis and underwent partial colectomy in February also underwent right BKA for necrotizing fasciitis. Does take midodrine  as needed for hypotension but reports she has not had to take it in a while.    HPI     Prior to Admission medications  Medication Sig Start Date End Date Taking? Authorizing Provider  ACCU-CHEK AVIVA PLUS test strip CHECK BLOOD GLUCOSE THREE TIMES DAILY 11/05/22   Joshua Domino, DO  acetaminophen  (TYLENOL ) 500 MG tablet Take 2 tablets (1,000 mg total) by mouth 4 (four) times daily. 06/09/24   Myrna Bitters, DO  albuterol  (PROVENTIL ) (2.5 MG/3ML) 0.083% nebulizer solution Take 3 mLs (2.5 mg total) by nebulization every 2 (two) hours as needed for wheezing. 11/25/23   Ghimire, Donalda HERO, MD  apixaban  (ELIQUIS ) 5 MG TABS tablet Take 1 tablet (5 mg total) by mouth 2 (two) times daily. 06/09/24   King, Olivia, DO  ascorbic acid  (VITAMIN C ) 500 MG tablet Take 1 tablet (500 mg total) by mouth 2 (two) times daily. 06/09/24   Myrna Bitters, DO  Blood Glucose Monitoring Suppl (ACCU-CHEK AVIVA PLUS) w/Device KIT CHECK BLOOD GLUCOSE THREE TIMES DAILY 10/26/20   Autry-Lott, Rojean, DO  brimonidine  (ALPHAGAN ) 0.2 % ophthalmic solution Place 1 drop into both eyes 2 (two) times daily.    [provider]  calcitRIOL  (ROCALTROL ) 0.25 MCG capsule TAKE 1 CAPSULE (0.25 MCG TOTAL) EVERY MONDAY, WEDNESDAY, AND  FRIDAY Patient taking differently: Take 0.25 mcg by mouth in the morning and at bedtime. 06/22/22   Christia Budds, MD  carvedilol  (COREG ) 25 MG tablet Take 25 mg by mouth 2 (two) times daily with a meal.    [provider]  collagenase  (SANTYL ) 250 UNIT/GM ointment Apply topically 2 (two) times daily. 06/09/24   King, Olivia, DO  Darbepoetin Alfa  (ARANESP ) 200 MCG/0.4ML SOSY injection Inject 0.4 mLs (200 mcg total) into the skin every Sunday at 6pm. 06/14/24   Myrna Bitters, DO  DROPLET INSULIN  SYRINGE 31G X 5/16 0.5 ML MISC USE FOUR TIMES DAILY FOR INJECTIONS 09/03/22   Joshua Domino, DO  HYDROmorphone  (DILAUDID ) 2 MG tablet Take 0.5 tablets (1 mg total) by mouth every 6 (six) hours as needed for severe pain (pain score 7-10) or moderate pain (pain score 4-6). 06/09/24   King, Olivia, DO  [Paused] insulin  aspart (NOVOLOG  FLEXPEN) 100 UNIT/ML FlexPen 0-9 Units, Subcutaneous, 3 times daily with meals CBG < 70: Implement Hypoglycemia measures CBG 70 - 120: 0 units CBG 121 - 150: 1 unit CBG 151 - 200: 2 units CBG 201 - 250: 3 units CBG 251 - 300: 5 units CBG 301 - 350: 7 units CBG 351 - 400: 9 units CBG > 400: call MD Wait to take this until your doctor or other care provider tells you to start again. 11/25/23   Ghimire, Donalda HERO, MD  [Paused] insulin  glargine (LANTUS ) 100 UNIT/ML injection Inject 0.07  mLs (7 Units total) into the skin daily. Please schedule appointment before next refill. Patient taking differently: Inject 7-10 Units into the skin daily. Please schedule appointment before next refill. Wait to take this until your doctor or other care provider tells you to start again. 02/01/24   [name], MD  Insulin  Syringes, Disposable, U-100 0.5 ML MISC 4x daily injections 03/21/18   Riccio, Angela C, DO  isosorbide  dinitrate (ISORDIL ) 20 MG tablet Take 20 mg by mouth daily.    [provider]  Lancet Devices (ACCU-CHEK [facility]) lancets Fill for 1 month for TID testing. Use as  instructed 11/13/13   Curtis Hadassah DASEN, MD  latanoprost  (XALATAN ) 0.005 % ophthalmic solution Place 1 drop into both eyes at bedtime.    [provider]  leptospermum manuka honey (MEDIHONEY) PSTE paste Apply 1 Application topically 2 (two) times daily. Apply with wound changes IF santyl  not available. 06/09/24   King, Olivia, DO  melatonin 5 MG TABS Take 10 mg by mouth at bedtime as needed (for insomnia).    [provider]  metoprolol  tartrate (LOPRESSOR ) 25 MG tablet Take 1 tablet (25 mg total) by mouth 2 (two) times daily. 06/09/24   King, Olivia, DO  midodrine  (PROAMATINE ) 2.5 MG tablet Take 2.5 mg by mouth daily as needed (low bp).    [provider]  Multiple Vitamin (MULTIVITAMIN WITH MINERALS) TABS Take 1 tablet by mouth daily.    [provider]  NEEDLE, DISP, 30 G (BD DISP NEEDLES) 30G X 1/2 MISC For 4x daily injections 11/05/15   Phelps, Jazma Y, DO  [Paused] NOVOLIN R 100 UNIT/ML injection Inject 0-9 Units into the skin in the morning, at noon, and at bedtime. Per sliding scale Wait to take this until your doctor or other care provider tells you to start again. 04/03/24   [provider]  nutrition supplement, JUVEN, (JUVEN) PACK Take 1 packet by mouth 2 (two) times daily between meals. 06/09/24   King, Olivia, DO  oxyCODONE  (OXY IR/ROXICODONE ) 5 MG immediate release tablet Take 1 tablet (5 mg total) by mouth every 6 (six) hours as needed for moderate pain (pain score 4-6). 06/30/24   Harden Jerona GAILS, MD  zinc  sulfate, 50mg  elemental zinc , 220 (50 Zn) MG capsule Take 1 capsule (220 mg total) by mouth daily. 06/10/24   Myrna Bitters, DO    Allergies: Nsaids, Lisinopril, and Peanut-containing drug products    Review of Systems See HPI   Physical Exam   Vitals:   09/25/24 2215 09/25/24 2246  BP: (!) 97/58 109/61  Pulse: 81   Resp: 18   Temp: 98 F (36.7 C)   SpO2: 100%     CONSTITUTIONAL:  well-appearing, NAD NEURO:  Alert and  oriented x 3, CN 3-12 grossly intact EYES:  eyes equal and reactive ENT/NECK:  Supple, no stridor  CARDIO:  regular rate and rhythm, appears well-perfused  PULM:  No respiratory distress, CTAB GI/GU:  non-distended, soft, non tender MSK/SPINE:  No gross deformities, no edema, moves all extremities, right BKA noted, sacral wound vac in place, no surrounding erythema, edema or oozing SKIN:  no rash, atraumatic  *Additional and/or pertinent findings included in MDM below  (all labs ordered are listed, but only abnormal results are displayed) Labs Reviewed  CBC WITH DIFFERENTIAL/PLATELET - Abnormal; Notable for the following components:      Result Value   RBC 3.60 (*)    Hemoglobin 9.5 (*)    HCT 31.2 (*)  RDW 21.3 (*)    Basophils Absolute 0.3 (*)    All other components within normal limits  COMPREHENSIVE METABOLIC PANEL WITH GFR - Abnormal; Notable for the following components:   Glucose, Bld 144 (*)    BUN 28 (*)    Creatinine, Ser 5.80 (*)    Calcium  8.6 (*)    Albumin  3.1 (*)    GFR, Estimated 7 (*)    All other components within normal limits  CBG MONITORING, ED - Abnormal; Notable for the following components:   Glucose-Capillary 135 (*)    All other components within normal limits  LIPASE, BLOOD    EKG: None  Radiology: No results found.   Procedures   Medications Ordered in the ED  midodrine  (PROAMATINE ) tablet 2.5 mg (2.5 mg Oral Given 09/25/24 2146)  acetaminophen  (TYLENOL ) tablet 1,000 mg (has no administration in time range)                                    Medical Decision Making Amount and/or Complexity of Data Reviewed Labs: ordered.  Risk OTC drugs. Prescription drug management.   Initial Impression and Ddx 69 year old well-appearing female presenting for hypotension.  Exam was unremarkable.  DDx includes hypovolemia after dialysis, electrolyte derangement, sepsis, arrhythmia, CHF, other. Patient PMH that increases complexity of ED  encounter: ESRD, sacral wound with indwelling wound VAC, diabetes, CAD, HTN  Interpretation of Diagnostics - I independent reviewed and interpreted the labs as followed: Hgb 9.5   Patient Reassessment and Ultimate Disposition/Management On reassessment after midodrine  blood pressure improved to 109/61.  She endorsed a mild headache which she states she usually has after dialysis.  Gave Tylenol .  She looks well work appears unremarkable and does not suggest sepsis.  Did inspect her sacral wound VAC and appears to be well-seated and without evidence of infection.  Suspect her hypotension is likely secondary to volume losses during dialysis today.  Clinically does not appear dehydrated.  She reports that she does have midodrine  at home and is instructed to take it as needed for her hypotension.  Advised her to follow-up with her PCP.  Discussed return precautions.  Discharged good condition.  Patient management required discussion with the following services or consulting groups:  None  Complexity of Problems Addressed Acute complicated illness or Injury  Additional Data Reviewed and Analyzed Further history obtained from: Past medical history and medications listed in the EMR and Prior ED visit notes  Patient Encounter Risk Assessment Consideration of hospitalization      Final diagnoses:  Hypotension, unspecified hypotension type    ED Discharge Orders     None          Lang Norleen POUR, PA-C 09/25/24 2250    Ula Prentice SAUNDERS, MD 09/25/24 2357  "

## 2024-09-29 ENCOUNTER — Encounter (HOSPITAL_BASED_OUTPATIENT_CLINIC_OR_DEPARTMENT_OTHER): Admitting: General Surgery

## 2024-10-05 ENCOUNTER — Observation Stay (HOSPITAL_COMMUNITY)
Admission: EM | Admit: 2024-10-05 | Discharge: 2024-10-06 | Disposition: A | Discharge status: Home / Self Care | Attending: Internal Medicine | Admitting: Internal Medicine

## 2024-10-05 ENCOUNTER — Other Ambulatory Visit: Payer: Self-pay

## 2024-10-05 ENCOUNTER — Emergency Department (HOSPITAL_COMMUNITY)

## 2024-10-05 DIAGNOSIS — D631 Anemia in chronic kidney disease: Secondary | ICD-10-CM | POA: Insufficient documentation

## 2024-10-05 DIAGNOSIS — Z91010 Allergy to peanuts: Secondary | ICD-10-CM | POA: Insufficient documentation

## 2024-10-05 DIAGNOSIS — Z89511 Acquired absence of right leg below knee: Secondary | ICD-10-CM | POA: Insufficient documentation

## 2024-10-05 DIAGNOSIS — E1122 Type 2 diabetes mellitus with diabetic chronic kidney disease: Secondary | ICD-10-CM | POA: Insufficient documentation

## 2024-10-05 DIAGNOSIS — E042 Nontoxic multinodular goiter: Secondary | ICD-10-CM | POA: Diagnosis not present

## 2024-10-05 DIAGNOSIS — R42 Dizziness and giddiness: Secondary | ICD-10-CM | POA: Diagnosis present

## 2024-10-05 DIAGNOSIS — I480 Paroxysmal atrial fibrillation: Secondary | ICD-10-CM | POA: Diagnosis not present

## 2024-10-05 DIAGNOSIS — Z992 Dependence on renal dialysis: Secondary | ICD-10-CM | POA: Diagnosis not present

## 2024-10-05 DIAGNOSIS — Z7901 Long term (current) use of anticoagulants: Secondary | ICD-10-CM | POA: Diagnosis not present

## 2024-10-05 DIAGNOSIS — Z79899 Other long term (current) drug therapy: Secondary | ICD-10-CM | POA: Insufficient documentation

## 2024-10-05 DIAGNOSIS — I63531 Cerebral infarction due to unspecified occlusion or stenosis of right posterior cerebral artery: Principal | ICD-10-CM | POA: Insufficient documentation

## 2024-10-05 DIAGNOSIS — D72829 Elevated white blood cell count, unspecified: Secondary | ICD-10-CM | POA: Insufficient documentation

## 2024-10-05 DIAGNOSIS — E1165 Type 2 diabetes mellitus with hyperglycemia: Secondary | ICD-10-CM | POA: Insufficient documentation

## 2024-10-05 DIAGNOSIS — N186 End stage renal disease: Secondary | ICD-10-CM | POA: Insufficient documentation

## 2024-10-05 DIAGNOSIS — I120 Hypertensive chronic kidney disease with stage 5 chronic kidney disease or end stage renal disease: Secondary | ICD-10-CM | POA: Diagnosis not present

## 2024-10-05 DIAGNOSIS — I639 Cerebral infarction, unspecified: Principal | ICD-10-CM | POA: Diagnosis present

## 2024-10-05 LAB — CBG MONITORING, ED: Glucose-Capillary: 198 mg/dL — ABNORMAL HIGH (ref 70–99)

## 2024-10-05 LAB — BASIC METABOLIC PANEL WITH GFR
Anion gap: 12 (ref 5–15)
BUN: 22 mg/dL (ref 8–23)
CO2: 28 mmol/L (ref 22–32)
Calcium: 8.6 mg/dL — ABNORMAL LOW (ref 8.9–10.3)
Chloride: 100 mmol/L (ref 98–111)
Creatinine, Ser: 4.13 mg/dL — ABNORMAL HIGH (ref 0.44–1.00)
GFR, Estimated: 11 mL/min — ABNORMAL LOW
Glucose, Bld: 249 mg/dL — ABNORMAL HIGH (ref 70–99)
Potassium: 3.6 mmol/L (ref 3.5–5.1)
Sodium: 140 mmol/L (ref 135–145)

## 2024-10-05 LAB — CBC WITH DIFFERENTIAL/PLATELET
Abs Immature Granulocytes: 0.06 K/uL (ref 0.00–0.07)
Basophils Absolute: 0.1 K/uL (ref 0.0–0.1)
Basophils Relative: 1 %
Eosinophils Absolute: 0.3 K/uL (ref 0.0–0.5)
Eosinophils Relative: 2 %
HCT: 28.3 % — ABNORMAL LOW (ref 36.0–46.0)
Hemoglobin: 8.7 g/dL — ABNORMAL LOW (ref 12.0–15.0)
Immature Granulocytes: 1 %
Lymphocytes Relative: 9 %
Lymphs Abs: 1.1 K/uL (ref 0.7–4.0)
MCH: 26.3 pg (ref 26.0–34.0)
MCHC: 30.7 g/dL (ref 30.0–36.0)
MCV: 85.5 fL (ref 80.0–100.0)
Monocytes Absolute: 0.8 K/uL (ref 0.1–1.0)
Monocytes Relative: 6 %
Neutro Abs: 10.3 K/uL — ABNORMAL HIGH (ref 1.7–7.7)
Neutrophils Relative %: 81 %
Platelets: 334 K/uL (ref 150–400)
RBC: 3.31 MIL/uL — ABNORMAL LOW (ref 3.87–5.11)
RDW: 19.1 % — ABNORMAL HIGH (ref 11.5–15.5)
WBC: 12.7 K/uL — ABNORMAL HIGH (ref 4.0–10.5)
nRBC: 0 % (ref 0.0–0.2)

## 2024-10-05 NOTE — ED Provider Notes (Signed)
 " [facility] Provider Note   CSN: [id number] Arrival date & time: 10/05/24  1946     Patient presents with: No chief complaint on file.   [name] is a 69 y.o. female.   69 year old female presenting with strokelike symptoms.  Patient had dialysis today, reports that they took off too much fluid, around 245pm she began to develop some numbness in the fingers of her left hand, no other areas of numbness/tingling to report.  Patient at baseline has very poor vision and reports that she is legally blind, reports that everything is blurry with her vision but that this is normal for her and there is no change from her visual baseline.  Her symptoms today were associated with a headache, this has largely resolved.  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  Patient attends dialysis Monday/Wednesday/Friday, no recent missed sessions.  No chest pain or shortness of breath.       Prior to Admission medications  Medication Sig Start Date End Date Taking? Authorizing Provider  ACCU-CHEK AVIVA PLUS test strip CHECK BLOOD GLUCOSE THREE TIMES DAILY 11/05/22   Joshua Domino, DO  acetaminophen  (TYLENOL ) 500 MG tablet Take 2 tablets (1,000 mg total) by mouth 4 (four) times daily. 06/09/24   Myrna Bitters, DO  albuterol  (PROVENTIL ) (2.5 MG/3ML) 0.083% nebulizer solution Take 3 mLs (2.5 mg total) by nebulization every 2 (two) hours as needed for wheezing. 11/25/23   Ghimire, Donalda HERO, MD  apixaban  (ELIQUIS ) 5 MG TABS tablet Take 1 tablet (5 mg total) by mouth 2 (two) times daily. 06/09/24   King, Olivia, DO  ascorbic acid  (VITAMIN C ) 500 MG tablet Take 1 tablet (500 mg total) by mouth 2 (two) times daily. 06/09/24   Myrna Bitters, DO  Blood Glucose Monitoring Suppl (ACCU-CHEK AVIVA PLUS) w/Device KIT CHECK BLOOD GLUCOSE THREE TIMES DAILY 10/26/20   Autry-Lott, Rojean, DO   brimonidine  (ALPHAGAN ) 0.2 % ophthalmic solution Place 1 drop into both eyes 2 (two) times daily.    [provider]  calcitRIOL  (ROCALTROL ) 0.25 MCG capsule TAKE 1 CAPSULE (0.25 MCG TOTAL) EVERY MONDAY, WEDNESDAY, AND FRIDAY Patient taking differently: Take 0.25 mcg by mouth in the morning and at bedtime. 06/22/22   Christia Budds, MD  carvedilol  (COREG ) 25 MG tablet Take 25 mg by mouth 2 (two) times daily with a meal.    [provider]  collagenase  (SANTYL ) 250 UNIT/GM ointment Apply topically 2 (two) times daily. 06/09/24   King, Olivia, DO  Darbepoetin Alfa  (ARANESP ) 200 MCG/0.4ML SOSY injection Inject 0.4 mLs (200 mcg total) into the skin every Sunday at 6pm. 06/14/24   Myrna Bitters, DO  DROPLET INSULIN  SYRINGE 31G X 5/16 0.5 ML MISC USE FOUR TIMES DAILY FOR INJECTIONS 09/03/22   Joshua Domino, DO  HYDROmorphone  (DILAUDID ) 2 MG tablet Take 0.5 tablets (1 mg total) by mouth every 6 (six) hours as needed for severe pain (pain score 7-10) or moderate pain (pain score 4-6). 06/09/24   King, Olivia, DO  [Paused] insulin  aspart (NOVOLOG  FLEXPEN) 100 UNIT/ML FlexPen 0-9 Units, Subcutaneous, 3 times daily with meals CBG < 70: Implement Hypoglycemia measures CBG 70 - 120: 0 units CBG 121 - 150: 1 unit CBG 151 - 200: 2 units CBG 201 - 250: 3 units CBG 251 - 300: 5 units CBG 301 - 350: 7 units CBG 351 - 400: 9 units CBG >  400: call MD Wait to take this until your doctor or other care provider tells you to start again. 11/25/23   Ghimire, Donalda HERO, MD  [Paused] insulin  glargine (LANTUS ) 100 UNIT/ML injection Inject 0.07 mLs (7 Units total) into the skin daily. Please schedule appointment before next refill. Patient taking differently: Inject 7-10 Units into the skin daily. Please schedule appointment before next refill. Wait to take this until your doctor or other care provider tells you to start again. 02/01/24   [name], MD  Insulin  Syringes, Disposable, U-100 0.5 ML MISC 4x daily  injections 03/21/18   Riccio, Angela C, DO  isosorbide  dinitrate (ISORDIL ) 20 MG tablet Take 20 mg by mouth daily.    [provider]  Lancet Devices (ACCU-CHEK [facility]) lancets Fill for 1 month for TID testing. Use as instructed 11/13/13   Curtis Hadassah DASEN, MD  latanoprost  (XALATAN ) 0.005 % ophthalmic solution Place 1 drop into both eyes at bedtime.    [provider]  leptospermum manuka honey (MEDIHONEY) PSTE paste Apply 1 Application topically 2 (two) times daily. Apply with wound changes IF santyl  not available. 06/09/24   King, Olivia, DO  melatonin 5 MG TABS Take 10 mg by mouth at bedtime as needed (for insomnia).    [provider]  metoprolol  tartrate (LOPRESSOR ) 25 MG tablet Take 1 tablet (25 mg total) by mouth 2 (two) times daily. 06/09/24   King, Olivia, DO  midodrine  (PROAMATINE ) 2.5 MG tablet Take 2.5 mg by mouth daily as needed (low bp).    [provider]  Multiple Vitamin (MULTIVITAMIN WITH MINERALS) TABS Take 1 tablet by mouth daily.    [provider]  NEEDLE, DISP, 30 G (BD DISP NEEDLES) 30G X 1/2 MISC For 4x daily injections 11/05/15   Phelps, Jazma Y, DO  [Paused] NOVOLIN R 100 UNIT/ML injection Inject 0-9 Units into the skin in the morning, at noon, and at bedtime. Per sliding scale Wait to take this until your doctor or other care provider tells you to start again. 04/03/24   [provider]  nutrition supplement, JUVEN, (JUVEN) PACK Take 1 packet by mouth 2 (two) times daily between meals. 06/09/24   King, Olivia, DO  oxyCODONE  (OXY IR/ROXICODONE ) 5 MG immediate release tablet Take 1 tablet (5 mg total) by mouth every 6 (six) hours as needed for moderate pain (pain score 4-6). 06/30/24   Harden Jerona GAILS, MD  zinc  sulfate, 50mg  elemental zinc , 220 (50 Zn) MG capsule Take 1 capsule (220 mg total) by mouth daily. 06/10/24   Myrna Bitters, DO    Allergies: Nsaids, Lisinopril, and Peanut-containing drug products    Review of  Systems  Updated Vital Signs  Vitals:   10/05/24 2002 10/05/24 2004  BP:  115/87  Pulse:  80  Resp:  18  Temp:  98.2 F (36.8 C)  TempSrc:  Oral  SpO2: 100% 100%  Weight: 57.6 kg   Height: 5' 7 (1.702 m)      Physical Exam Vitals and nursing note reviewed.  HENT:     Head: Normocephalic and atraumatic.  Eyes:     Extraocular Movements: Extraocular movements intact.     Pupils: Pupils are equal, round, and reactive to light.  Cardiovascular:     Rate and Rhythm: Normal rate.  Pulmonary:     Effort: Pulmonary effort is normal.  Musculoskeletal:     Cervical back: Normal range of motion.     Comments: 5/5 strength against resistance of bilateral  upper and lower extremities Grip strength symmetric and in-tact RLE amputation with prosthetic in place  Neurological:     Mental Status: She is alert and oriented to person, place, and time.     Comments: Numbness in fingers of L hand, no other sensory deficits/weakness Facial expressions are symmetric and intact without evidence of facial droop Normal cerebellar testing without ataxia, including finger-to-nose Normal phonation, no slurred speech     (all labs ordered are listed, but only abnormal results are displayed) Labs Reviewed  CBC WITH DIFFERENTIAL/PLATELET - Abnormal; Notable for the following components:      Result Value   WBC 12.7 (*)    RBC 3.31 (*)    Hemoglobin 8.7 (*)    HCT 28.3 (*)    RDW 19.1 (*)    Neutro Abs 10.3 (*)    All other components within normal limits  BASIC METABOLIC PANEL WITH GFR - Abnormal; Notable for the following components:   Glucose, Bld 249 (*)    Creatinine, Ser 4.13 (*)    Calcium  8.6 (*)    GFR, Estimated 11 (*)    All other components within normal limits  CBG MONITORING, ED - Abnormal; Notable for the following components:   Glucose-Capillary 198 (*)    All other components within normal limits    EKG: None  Radiology: MR BRAIN WO CONTRAST Result Date:  10/05/2024 CLINICAL DATA:  Initial evaluation for acute left hand numbness. EXAM: MRI HEAD WITHOUT CONTRAST TECHNIQUE: Multiplanar, multiecho pulse sequences of the brain and surrounding structures were obtained without intravenous contrast. COMPARISON:  Prior exam from 02/01/2024. FINDINGS: Brain: Generalized age-related cerebral atrophy. Patchy T2/FLAIR hyperintensity involving the supratentorial cerebral white matter and pons, consistent chronic small vessel ischemic disease. Few small remote lacunar infarcts about the bilateral basal ganglia and thalami. Scattered restricted diffusion involving the posterior right parietal, temporal, and occipital lobes. Associated FLAIR signal intensity within this region. Findings consistent with an acute to early subacute infarct, right PCA and/or MCA-PCA watershed distribution. Mild petechial hemorrhage without frank hemorrhagic transformation. No significant mass effect. Gray-white matter differentiation otherwise maintained. No other acute or chronic intracranial hemorrhage. No mass lesion or midline shift. No hydrocephalus or extra-axial fluid collection. Pituitary gland within normal limits. Vascular: Major intracranial vascular flow voids are maintained. Skull and upper cervical spine: Cranial junction within normal limits. Bone marrow signal intensity within normal limits. No scalp soft tissue abnormality. Sinuses/Orbits: Prior bilateral ocular lens replacement. Left gaze preference noted. Left maxillary sinus retention cyst noted. Paranasal sinuses are otherwise largely clear. Trace bilateral mastoid effusions, of doubtful significance. Other: None. IMPRESSION: 1. Acute to early subacute infarct involving the posterior right cerebral hemispheres above. Mild petechial hemorrhage without frank hemorrhagic transformation or significant mass effect. 2. Underlying age-related cerebral atrophy with changes of chronic microvascular ischemic disease. Electronically Signed    By: Morene Hoard M.D.   On: 10/05/2024 23:39     Procedures   Medications Ordered in the ED - No data to display                                  Medical Decision Making This patient presents to the ED for concern of numbness in fingers of left hand, this involves an extensive number of treatment options, and is a complaint that carries with it a high risk of complications and morbidity.  The differential diagnosis includes stroke versus TIA, intracranial hemorrhage, electrolyte  disturbance   Co morbidities that complicate the patient evaluation  ESRD on dialysis   Additional history obtained:  Additional history obtained from record review External records from outside source obtained and reviewed including prior vascular surgery note   Lab Tests:  I Ordered, and personally interpreted labs.  The pertinent results include: CBC notable for leukocytosis with white blood cell count of 12.7, hemoglobin of 8.7 is down from her most recent baseline of 9.5.  BMP notable for creatinine of 4.13 which is down from her most recent baseline. CBG 198.    Imaging Studies ordered:  I ordered imaging studies including MRI brain without contrast I independently visualized and interpreted imaging which showed 1. Acute to early subacute infarct involving the posterior right cerebral hemispheres above. Mild petechial hemorrhage without frank hemorrhagic transformation or significant mass effect. 2. Underlying age-related cerebral atrophy with changes of chronic microvascular ischemic disease.  I agree with the radiologist interpretation   Cardiac Monitoring: / EKG:  The patient was maintained on a cardiac monitor.  I personally viewed and interpreted the cardiac monitored which showed an underlying rhythm of: NSR   Consultations Obtained:  I requested consultation with the neurology,  and discussed lab and imaging findings as well as pertinent plan - they recommend: I spoke with  Dr. Michaela who recommends admission to the hospital service with plan to hold Eliquis . Hospitalist call pending at shift change.    Problem List / ED Course / Critical interventions / Medication management I have reviewed the patients home medicines and have made adjustments as needed   Social Determinants of Health:  Social isolation   Test / Admission - Considered:  Physical exam is notable as above, neurologic exam is largely reassuring, patient does complain of numbness in all the fingers of her left hand besides her thumb, strength/sensation is otherwise intact, no evidence of facial droop, no slurred speech, no unilateral weakness, no appreciable ataxia.  Triage note reports left-sided facial droop, I do not appreciate this on my examination of the patient.  VAN negative, last known well 2:45pm. Spoke with Dr. Michaela who recommends MRI imaging for further assessment given numbness in fingers of left hand.  Labs are largely unremarkable, mild leukocytosis with white blood cell count of 12.7, creatinine is largely stable from baseline with history of ESRD on dialysis.  Last dialysis session was earlier today which she did complete, scheduled for Monday/Wednesday/Friday dialysis. MRI notable as above: acute to early subacute infarct involving the posterior R cerebral hemispheres with mild petechial hemorrhage without frank hemorrhagic transformation/significant mass effect. I spoke with neurology as above who recommends admission to the hospitalist service, consult call pending at time of shift change.  Patient handed off to EDP Olam [place], see their note for discussion with hospitalist in regard to admission.  Amount and/or Complexity of Data Reviewed Labs: ordered. Radiology: ordered.  Risk Decision regarding hospitalization.        Final diagnoses:  Cerebrovascular accident (CVA), unspecified mechanism [facility])    ED Discharge Orders     None           Glendia Rocky SAILOR, [place] 10/05/24 2355  "

## 2024-10-05 NOTE — ED Triage Notes (Signed)
 Pt BIB EMS from home. EMS reports pt began noticing symptoms at 2:46pm after dialysis (LKW 2:45 pm), said they took more fluid han usual, L hand numbness, headache that has since resolved. Slight facial droop, L side ataxia, no unilateral weakness. C/o dizziness when standing. No other complaints at this time. Takes eliquis  for afib.   PA called to bedside to assess pt upon arrival.   18g L forearm EMS VS 118/64, HR 84, R 16, 412 cbg, 90% RA

## 2024-10-06 ENCOUNTER — Ambulatory Visit (HOSPITAL_COMMUNITY)

## 2024-10-06 ENCOUNTER — Observation Stay (HOSPITAL_COMMUNITY)

## 2024-10-06 ENCOUNTER — Other Ambulatory Visit (HOSPITAL_COMMUNITY): Payer: Self-pay

## 2024-10-06 ENCOUNTER — Ambulatory Visit: Admitting: Vascular Surgery

## 2024-10-06 DIAGNOSIS — I63531 Cerebral infarction due to unspecified occlusion or stenosis of right posterior cerebral artery: Secondary | ICD-10-CM | POA: Diagnosis not present

## 2024-10-06 DIAGNOSIS — R233 Spontaneous ecchymoses: Secondary | ICD-10-CM

## 2024-10-06 DIAGNOSIS — I358 Other nonrheumatic aortic valve disorders: Secondary | ICD-10-CM

## 2024-10-06 DIAGNOSIS — I6389 Other cerebral infarction: Secondary | ICD-10-CM | POA: Diagnosis not present

## 2024-10-06 DIAGNOSIS — I5030 Unspecified diastolic (congestive) heart failure: Secondary | ICD-10-CM | POA: Diagnosis not present

## 2024-10-06 DIAGNOSIS — I639 Cerebral infarction, unspecified: Secondary | ICD-10-CM | POA: Diagnosis present

## 2024-10-06 LAB — ECHOCARDIOGRAM COMPLETE
Area-P 1/2: 3.17 cm2
Height: 67 in
S' Lateral: 2.7 cm
Weight: 2032 [oz_av]

## 2024-10-06 LAB — CBC WITH DIFFERENTIAL/PLATELET
Abs Immature Granulocytes: 0.08 K/uL — ABNORMAL HIGH (ref 0.00–0.07)
Basophils Absolute: 0.1 K/uL (ref 0.0–0.1)
Basophils Relative: 1 %
Eosinophils Absolute: 0.3 K/uL (ref 0.0–0.5)
Eosinophils Relative: 3 %
HCT: 30 % — ABNORMAL LOW (ref 36.0–46.0)
Hemoglobin: 9 g/dL — ABNORMAL LOW (ref 12.0–15.0)
Immature Granulocytes: 1 %
Lymphocytes Relative: 10 %
Lymphs Abs: 1.1 K/uL (ref 0.7–4.0)
MCH: 25.9 pg — ABNORMAL LOW (ref 26.0–34.0)
MCHC: 30 g/dL (ref 30.0–36.0)
MCV: 86.2 fL (ref 80.0–100.0)
Monocytes Absolute: 1.3 K/uL — ABNORMAL HIGH (ref 0.1–1.0)
Monocytes Relative: 11 %
Neutro Abs: 8.3 K/uL — ABNORMAL HIGH (ref 1.7–7.7)
Neutrophils Relative %: 74 %
Platelets: 367 K/uL (ref 150–400)
RBC: 3.48 MIL/uL — ABNORMAL LOW (ref 3.87–5.11)
RDW: 19.7 % — ABNORMAL HIGH (ref 11.5–15.5)
WBC: 11.2 K/uL — ABNORMAL HIGH (ref 4.0–10.5)
nRBC: 0 % (ref 0.0–0.2)

## 2024-10-06 LAB — RENAL FUNCTION PANEL
Albumin: 3.1 g/dL — ABNORMAL LOW (ref 3.5–5.0)
Anion gap: 11 (ref 5–15)
BUN: 29 mg/dL — ABNORMAL HIGH (ref 8–23)
CO2: 28 mmol/L (ref 22–32)
Calcium: 8.8 mg/dL — ABNORMAL LOW (ref 8.9–10.3)
Chloride: 101 mmol/L (ref 98–111)
Creatinine, Ser: 5.31 mg/dL — ABNORMAL HIGH (ref 0.44–1.00)
GFR, Estimated: 8 mL/min — ABNORMAL LOW
Glucose, Bld: 240 mg/dL — ABNORMAL HIGH (ref 70–99)
Phosphorus: 3.9 mg/dL (ref 2.5–4.6)
Potassium: 3.9 mmol/L (ref 3.5–5.1)
Sodium: 139 mmol/L (ref 135–145)

## 2024-10-06 LAB — CBG MONITORING, ED
Glucose-Capillary: 244 mg/dL — ABNORMAL HIGH (ref 70–99)
Glucose-Capillary: 184 mg/dL — ABNORMAL HIGH (ref 70–99)

## 2024-10-06 LAB — LIPID PANEL
Cholesterol: 182 mg/dL (ref 0–200)
HDL: 44 mg/dL
LDL Cholesterol: 110 mg/dL — ABNORMAL HIGH (ref 0–99)
Total CHOL/HDL Ratio: 4.1 ratio
Triglycerides: 139 mg/dL
VLDL: 28 mg/dL (ref 0–40)

## 2024-10-06 LAB — HEMOGLOBIN A1C
Hgb A1c MFr Bld: 5.8 % — ABNORMAL HIGH (ref 4.8–5.6)
Mean Plasma Glucose: 119.76 mg/dL

## 2024-10-06 MED ORDER — INSULIN ASPART 100 UNIT/ML IJ SOLN
0.0000 [IU] | Freq: Every day | INTRAMUSCULAR | Status: DC
  Administered 2024-10-06: 2 [IU] via SUBCUTANEOUS
  Filled 2024-10-06: qty 2

## 2024-10-06 MED ORDER — PROCHLORPERAZINE EDISYLATE 10 MG/2ML IJ SOLN: 5.0000 mg | Freq: Four times a day (QID) | INTRAMUSCULAR | Status: DC | PRN

## 2024-10-06 MED ORDER — ATORVASTATIN CALCIUM 40 MG PO TABS
40.0000 mg | ORAL_TABLET | Freq: Every day | ORAL | Status: DC
  Administered 2024-10-06: 40 mg via ORAL
  Filled 2024-10-06: qty 1

## 2024-10-06 MED ORDER — ACETAMINOPHEN 500 MG PO TABS
500.0000 mg | ORAL_TABLET | Freq: Four times a day (QID) | ORAL | Status: DC | PRN
  Administered 2024-10-06: 500 mg via ORAL
  Filled 2024-10-06: qty 1

## 2024-10-06 MED ORDER — ASPIRIN 81 MG PO TBEC
81.0000 mg | DELAYED_RELEASE_TABLET | Freq: Every day | ORAL | Status: DC
  Administered 2024-10-06: 81 mg via ORAL
  Filled 2024-10-06: qty 1

## 2024-10-06 MED ORDER — INSULIN ASPART 100 UNIT/ML IJ SOLN
0.0000 [IU] | Freq: Three times a day (TID) | INTRAMUSCULAR | Status: DC
  Administered 2024-10-06: 1 [IU] via SUBCUTANEOUS
  Filled 2024-10-06: qty 1

## 2024-10-06 MED ORDER — ATORVASTATIN CALCIUM 40 MG PO TABS
40.0000 mg | ORAL_TABLET | Freq: Every day | ORAL | 0 refills | Status: AC
  Filled 2024-10-06: qty 90, 90d supply, fill #0

## 2024-10-06 MED ORDER — MELATONIN 5 MG PO TABS: 5.0000 mg | ORAL_TABLET | Freq: Every evening | ORAL | Status: DC | PRN

## 2024-10-06 MED ORDER — STROKE: EARLY STAGES OF RECOVERY BOOK: Freq: Once | Status: DC

## 2024-10-06 MED ORDER — POLYETHYLENE GLYCOL 3350 17 G PO PACK: 17.0000 g | PACK | Freq: Every day | ORAL | Status: DC | PRN

## 2024-10-06 MED ORDER — IOHEXOL 350 MG/ML SOLN
75.0000 mL | Freq: Once | INTRAVENOUS | Status: AC | PRN
  Administered 2024-10-06: 75 mL via INTRAVENOUS

## 2024-10-06 NOTE — [place] FL2 (Signed)
 " [place]  MEDICAID FL2 LEVEL OF CARE FORM     IDENTIFICATION  Patient Name: [name] Birthdate: [birth year] Sex: female Admission Date (Current Location): 10/05/2024  [facility] [place] Number:  Producer, Television/film/video and Address:  [facility] [place], [address], [place], [place] [address]      Provider Number: [id number]  Attending Physician Name and Address:  Lue Elsie BROCKS, MD  Relative Name and Phone Number:  [name],  [phone]    Current Level of Care: Hospital Recommended Level of Care: Skilled Nursing Facility Prior Approval Number:    Date Approved/Denied:   PASRR Number: [id number]  Discharge Plan: SNF    Current Diagnoses: Patient Active Problem List   Diagnosis Date Noted   CVA (cerebral vascular accident) (HCC) 10/06/2024   Non-healing wound of amputation stump (HCC) 06/24/2024   Wound dehiscence 06/04/2024   Severe protein-calorie malnutrition 06/03/2024   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) 05/30/2024   Hypoalbuminemia due to protein-calorie malnutrition 05/30/2024   Osteomyelitis of vertebra, sacral and sacrococcygeal region (HCC) 05/29/2024   Primary hypercoagulable state 05/29/2024   BKA stump complication (HCC) 05/29/2024   Normocytic anemia 05/29/2024   Does not feel safe at home 05/29/2024   Colostomy in place [facility]) 05/29/2024   Diabetes mellitus with kidney complication, with long-term current use of insulin  (HCC) 05/29/2024   Osteomyelitis (HCC) 04/20/2024   MRSA bacteremia 04/20/2024   Bacteremia 04/20/2024   Necrotizing fasciitis (HCC) 04/20/2024   ESRD (end stage renal disease) (HCC) 04/20/2024   Irritant contact dermatitis associated with fecal stoma 12/27/2023   Colostomy complication, unspecified (HCC) 12/27/2023   Diverticulitis of large intestine with complication 10/27/2023   Foot callus 02/27/2021   Counseled about COVID-19 virus infection 02/24/2020   Aortic heart murmur  05/15/2019   Thyroid  enlargement 07/19/2014   Vitamin D  deficiency 09/03/2013   Hyperlipidemia 04/24/2013   Diabetic retinopathy (HCC) 01/23/2013   Chronic atrial fibrillation (HCC) 01/15/2013   Aortic atherosclerosis 01/11/2013   H/O non-ST elevation myocardial infarction (NSTEMI) 01/09/2013   History of stroke 01/08/2013   Diabetic neuropathy 01/08/2013   Hypertension    Diabetes mellitus     Orientation RESPIRATION BLADDER Height & Weight     Self, Time, Situation, Place  Normal Continent Weight: 127 lb (57.6 kg) Height:  5' 7 (170.2 cm)  BEHAVIORAL SYMPTOMS/MOOD NEUROLOGICAL BOWEL NUTRITION STATUS      Continent Diet (see dc summary)  AMBULATORY STATUS COMMUNICATION OF NEEDS Skin   Limited Assist Verbally Normal                       Personal Care Assistance Level of Assistance  Bathing, Feeding, Dressing Bathing Assistance: Limited assistance Feeding assistance: Limited assistance Dressing Assistance: Limited assistance     Functional Limitations Info  Speech, Hearing, Sight Sight Info: Impaired Hearing Info: Adequate Speech Info: Adequate    SPECIAL CARE FACTORS FREQUENCY  PT (By licensed PT), OT (By licensed OT)     PT Frequency: 5x/wk OT Frequency: 5x/wk            Contractures Contractures Info: Present    Additional Factors Info  Code Status, Allergies Code Status Info: full code Allergies Info: Nsaids  Lisinopril  Peanut-containing Drug Products           Current Medications (10/06/2024):  This is the current hospital active medication list Current Facility-Administered Medications  Medication Dose Route Frequency Provider Last Rate Last  Admin   [START ON 10/07/2024]  stroke: early stages of recovery book   Does not apply Once Shona Terry SAILOR, DO       acetaminophen  (TYLENOL ) tablet 500 mg  500 mg Oral Q6H PRN Hall, Carole N, DO   500 mg at 10/06/24 9381   aspirin  EC tablet 81 mg  81 mg Oral Daily Michaela Aisha SQUIBB, MD        atorvastatin  (LIPITOR ) tablet 40 mg  40 mg Oral Daily Remi Pippin, NP       insulin  aspart (novoLOG ) injection 0-5 Units  0-5 Units Subcutaneous QHS Shona Terry SAILOR, DO   2 Units at 10/06/24 9775   insulin  aspart (novoLOG ) injection 0-6 Units  0-6 Units Subcutaneous TID WC Hall, Carole N, DO       melatonin tablet 5 mg  5 mg Oral QHS PRN Shona Terry N, DO       polyethylene glycol (MIRALAX  / GLYCOLAX ) packet 17 g  17 g Oral Daily PRN Hall, Carole N, DO       prochlorperazine  (COMPAZINE ) injection 5 mg  5 mg Intravenous Q6H PRN Shona Terry SAILOR, DO       Current Outpatient Medications  Medication Sig Dispense Refill   ACCU-CHEK AVIVA PLUS test strip CHECK BLOOD GLUCOSE THREE TIMES DAILY 300 strip 3   acetaminophen  (TYLENOL ) 500 MG tablet Take 2 tablets (1,000 mg total) by mouth 4 (four) times daily.     albuterol  (PROVENTIL ) (2.5 MG/3ML) 0.083% nebulizer solution Take 3 mLs (2.5 mg total) by nebulization every 2 (two) hours as needed for wheezing.     apixaban  (ELIQUIS ) 5 MG TABS tablet Take 1 tablet (5 mg total) by mouth 2 (two) times daily.     ascorbic acid  (VITAMIN C ) 500 MG tablet Take 1 tablet (500 mg total) by mouth 2 (two) times daily.     atorvastatin  (LIPITOR ) 40 MG tablet Take 1 tablet (40 mg total) by mouth daily. 90 tablet 0   Blood Glucose Monitoring Suppl (ACCU-CHEK AVIVA PLUS) w/Device KIT CHECK BLOOD GLUCOSE THREE TIMES DAILY 1 kit 0   brimonidine  (ALPHAGAN ) 0.2 % ophthalmic solution Place 1 drop into both eyes 2 (two) times daily.     calcitRIOL  (ROCALTROL ) 0.25 MCG capsule TAKE 1 CAPSULE (0.25 MCG TOTAL) EVERY MONDAY, WEDNESDAY, AND FRIDAY (Patient taking differently: Take 0.25 mcg by mouth in the morning and at bedtime.) 39 capsule 0   collagenase  (SANTYL ) 250 UNIT/GM ointment Apply topically 2 (two) times daily.     Darbepoetin Alfa  (ARANESP ) 200 MCG/0.4ML SOSY injection Inject 0.4 mLs (200 mcg total) into the skin every Sunday at 6pm.     DROPLET INSULIN  SYRINGE 31G X 5/16  0.5 ML MISC USE FOUR TIMES DAILY FOR INJECTIONS 400 each 3   HYDROmorphone  (DILAUDID ) 2 MG tablet Take 0.5 tablets (1 mg total) by mouth every 6 (six) hours as needed for severe pain (pain score 7-10) or moderate pain (pain score 4-6). 30 tablet 0   insulin  aspart (NOVOLOG  FLEXPEN) 100 UNIT/ML FlexPen 0-9 Units, Subcutaneous, 3 times daily with meals CBG < 70: Implement Hypoglycemia measures CBG 70 - 120: 0 units CBG 121 - 150: 1 unit CBG 151 - 200: 2 units CBG 201 - 250: 3 units CBG 251 - 300: 5 units CBG 301 - 350: 7 units CBG 351 - 400: 9 units CBG > 400: call MD     Insulin  Syringes, Disposable, U-100 0.5 ML MISC 4x daily injections 100 each 8  Lancet Devices (ACCU-CHEK SOFTCLIX) lancets Fill for 1 month for TID testing. Use as instructed 90 each 0   latanoprost  (XALATAN ) 0.005 % ophthalmic solution Place 1 drop into both eyes at bedtime.     leptospermum manuka honey (MEDIHONEY) PSTE paste Apply 1 Application topically 2 (two) times daily. Apply with wound changes IF santyl  not available.     melatonin 5 MG TABS Take 10 mg by mouth at bedtime as needed (for insomnia).     midodrine  (PROAMATINE ) 2.5 MG tablet Take 2.5 mg by mouth daily as needed (low bp).     Multiple Vitamin (MULTIVITAMIN WITH MINERALS) TABS Take 1 tablet by mouth daily.     NEEDLE, DISP, 30 G (BD DISP NEEDLES) 30G X 1/2 MISC For 4x daily injections 100 each 13   NOVOLIN R 100 UNIT/ML injection Inject 0-9 Units into the skin in the morning, at noon, and at bedtime. Per sliding scale     nutrition supplement, JUVEN, (JUVEN) PACK Take 1 packet by mouth 2 (two) times daily between meals.     oxyCODONE  (OXY IR/ROXICODONE ) 5 MG immediate release tablet Take 1 tablet (5 mg total) by mouth every 6 (six) hours as needed for moderate pain (pain score 4-6). 30 tablet 0   zinc  sulfate, 50mg  elemental zinc , 220 (50 Zn) MG capsule Take 1 capsule (220 mg total) by mouth daily.       Discharge Medications: Please see discharge summary for  a list of discharge medications.  Relevant Imaging Results:  Relevant Lab Results:   Additional Information SSN [id number]  Sheri ONEIDA Sharps, LCSW     "

## 2024-10-06 NOTE — H&P (Signed)
 " History and Physical  [name] FMW:[id number] DOB: [birth year] DOA: 10/05/2024  Referring physician: Dr. Trine, EDP  PCP: Campbell Reynolds, NP  Outpatient Specialists: Cardiology, wound care specialist, cardiovascular surgery, orthopedic surgery. Patient coming from: Home.  Chief Complaint: Left hand numbness, dizziness, headache.  HPI: [name] is a 69 y.o. 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  The timing of last known well is unclear.  EMS was activated and the patient was brought into the ER for further evaluation.  In the ER, CT angio head and neck with and without contrast revealed occlusion of the right PCA at the proximal right P2 segment.  Right PCA remains occluded for short segment but is patent and perfused distally.  This could be due to subocclusive thrombus and or recanalized clot.  Atheromatous change about the carotid siphons with secondary mild to moderate narrowing bilaterally.  Atheromatous plaque about the proximal left subclavian artery with estimated 50% stenosis.  Enlarged multinodular thyroid  this has been previously evaluated by thyroid  ultrasound and biopsy in 2013.  Noncontrast brain MRI revealed acute to early subacute infarct involving the posterior right cerebral hemispheres above.  Mild petechial hemorrhage without frank hemorrhagic transformation or significant mass effect.  Underlying age-related cerebral atrophy with changes of chronic microvascular ischemic disease.  Seen by neurology, recommended holding Eliquis  given the petechial hemorrhage and the size of the infarcts.  Started on aspirin  81 mg daily.  Admitted by [facility],  hospitalist service.  ED Course: Temperature 98.4.  BP 115/87, pulse 80, respiratory rate 18, O2 saturation 100% on room air.  Review of Systems: Review of systems as noted in the HPI. All other systems reviewed and are negative.   Past Medical History:  Diagnosis Date   Anemia    Arthritis    Atrial fibrillation (HCC)    CAD (coronary artery disease)    Chronic kidney disease, stage III (moderate) (HCC)    Constipation    Diabetes mellitus (HCC)    ESRD on dialysis (HCC) 04/20/2024   History of blood transfusion    HLD (hyperlipidemia)    Hypertension    Myocardial infarction [facility])    in April 2014   Septic shock (HCC) 04/19/2024   Wound infection after surgery 03/17/2013   Past Surgical History:  Procedure Laterality Date   AMPUTATION Right 04/20/2024   Procedure: AMPUTATION BELOW KNEE;  Surgeon: Harden Jerona GAILS, MD;  Location: [facility] OR;  Service: Orthopedics;  Laterality: Right;   ANKLE SURGERY Right    x 6 total   BREAST BIOPSY Left    15 or 20 yearsa ago she cant remember laterality    HARDWARE REMOVAL Right 02/13/2013   Procedure: HARDWARE REMOVAL;  Surgeon: Jerona GAILS Harden, MD;  Location: [facility] OR;  Service: Orthopedics;  Laterality: Right;  Right Ankle Removal Hardware, Debridement, Place Wound VAC   IR FLUORO GUIDE CV LINE LEFT  11/08/2023   IR FLUORO GUIDE CV LINE LEFT  04/23/2024   IR FLUORO GUIDE CV LINE RIGHT  11/01/2023   IR FLUORO GUIDE CV LINE RIGHT  11/12/2023   IR REMOVAL TUN CV CATH W/O FL  04/21/2024   IR US  GUIDE VASC ACCESS LEFT  11/08/2023   IR  US  GUIDE VASC ACCESS LEFT  04/23/2024   IR US  GUIDE VASC ACCESS RIGHT  11/01/2023   IR US  GUIDE VASC ACCESS RIGHT  11/12/2023   laser surgery for diabetic retinopathy Bilateral    LEFT HEART CATHETERIZATION WITH CORONARY ANGIOGRAM N/A 01/09/2013   Procedure: LEFT HEART CATHETERIZATION WITH CORONARY ANGIOGRAM;  Surgeon: Toribio JONELLE Fuel, MD;  Location: [facility] CATH LAB;  Service: Cardiovascular;  Laterality: N/A;   ORIF ANKLE  FRACTURE Right 01/12/2013   Procedure: OPEN REDUCTION INTERNAL FIXATION (ORIF) ANKLE FRACTURE;  Surgeon: Jerona LULLA Sage, MD;  Location: [facility] OR;  Service: Orthopedics;  Laterality: Right;   PARTIAL COLECTOMY N/A 11/02/2023   Procedure: OPEN PARTIAL COLECTOMY WITH END COLOSTOMY;  Surgeon: Dasie Leonor CROME, MD;  Location: [facility] OR;  Service: General;  Laterality: N/A;   PARTIAL HYSTERECTOMY     REVISION AMPUTATION, BELOW THE KNEE Right 06/24/2024   Procedure: REVISION AMPUTATION, BELOW THE KNEE;  Surgeon: Sage Jerona LULLA, MD;  Location: [facility] OR;  Service: Orthopedics;  Laterality: Right;   TUNNELLED CATHETER EXCHANGE N/A 04/15/2024   Procedure: TUNNELLED CATHETER EXCHANGE;  Surgeon: Pearline Norman RAMAN, MD;  Location: [facility] LAB;  Service: Cardiovascular;  Laterality: N/A;   TUNNELLED CATHETER EXCHANGE N/A 07/23/2024   Procedure: TUNNELLED CATHETER EXCHANGE;  Surgeon: Serene Gaile ORN, MD;  Location: [facility];  Service: Cardiovascular;  Laterality: N/A;    Social History:  reports that she has never smoked. She has never used smokeless tobacco. She reports that she does not drink alcohol  and does not use drugs.   Allergies[1]  Family History  Problem Relation Age of Onset   Diabetes Mother        No history CAD   Colon cancer Mother 44       died at 24 from CRC   Hypertension Father        Also had CAD   Heart disease Father    Cervical cancer Sister    Diabetes Sister    Congestive Heart Failure Sister    Heart disease Maternal Grandmother    Diabetes Brother    Kidney disease Brother    Sickle cell anemia Son    Thyroid  disease Son    Breast cancer Neg Hx    BRCA 1/2 Neg Hx       Prior to Admission medications  Medication Sig Start Date End Date Taking? Authorizing Provider  ACCU-CHEK AVIVA PLUS test strip CHECK BLOOD GLUCOSE THREE TIMES DAILY 11/05/22   Joshua Domino, DO  acetaminophen  (TYLENOL ) 500 MG tablet Take 2 tablets (1,000 mg total) by mouth 4 (four) times daily. 06/09/24   Myrna Bitters, DO  albuterol  (PROVENTIL ) (2.5 MG/3ML) 0.083% nebulizer solution Take 3 mLs (2.5 mg total) by nebulization every 2 (two) hours as needed for wheezing. 11/25/23   Ghimire, Donalda HERO, MD  apixaban  (ELIQUIS ) 5 MG TABS tablet Take 1 tablet (5 mg total) by mouth 2 (two) times daily. 06/09/24   King, Olivia, DO  ascorbic acid  (VITAMIN C ) 500 MG tablet Take 1 tablet (500 mg total) by mouth 2 (two) times daily. 06/09/24   Myrna Bitters, DO  Blood Glucose Monitoring Suppl (ACCU-CHEK AVIVA PLUS) w/Device KIT CHECK BLOOD GLUCOSE THREE TIMES DAILY 10/26/20   Autry-Lott, Rojean, DO  brimonidine  (ALPHAGAN ) 0.2 % ophthalmic solution Place 1 drop into both eyes 2 (two) times daily.    [provider]  calcitRIOL  (ROCALTROL ) 0.25 MCG capsule TAKE 1 CAPSULE (0.25 MCG TOTAL) EVERY MONDAY, WEDNESDAY, AND FRIDAY Patient  taking differently: Take 0.25 mcg by mouth in the morning and at bedtime. 06/22/22   Christia Budds, MD  carvedilol  (COREG ) 25 MG tablet Take 25 mg by mouth 2 (two) times daily with a meal.    [provider]  collagenase  (SANTYL ) 250 UNIT/GM ointment Apply topically 2 (two) times daily. 06/09/24   King, Olivia, DO  Darbepoetin Alfa  (ARANESP ) 200 MCG/0.4ML SOSY injection Inject 0.4 mLs (200 mcg total) into the skin every Sunday at 6pm. 06/14/24   Myrna Bitters, DO  DROPLET INSULIN  SYRINGE 31G X 5/16 0.5 ML MISC USE FOUR TIMES DAILY FOR INJECTIONS 09/03/22   Joshua Domino, DO  HYDROmorphone  (DILAUDID ) 2 MG tablet Take 0.5 tablets (1 mg total) by mouth every 6 (six) hours as needed for severe pain (pain score 7-10) or moderate pain (pain score 4-6). 06/09/24   King, Olivia, DO  [Paused] insulin  aspart (NOVOLOG  FLEXPEN) 100 UNIT/ML FlexPen 0-9 Units, Subcutaneous, 3 times daily with meals CBG < 70: Implement Hypoglycemia measures CBG 70 - 120: 0 units CBG 121 - 150: 1 unit CBG 151 - 200: 2 units CBG 201 - 250: 3 units CBG 251 - 300: 5 units CBG 301 - 350: 7 units CBG 351 - 400: 9 units CBG >  400: call MD Wait to take this until your doctor or other care provider tells you to start again. 11/25/23   Ghimire, Donalda HERO, MD  [Paused] insulin  glargine (LANTUS ) 100 UNIT/ML injection Inject 0.07 mLs (7 Units total) into the skin daily. Please schedule appointment before next refill. Patient taking differently: Inject 7-10 Units into the skin daily. Please schedule appointment before next refill. Wait to take this until your doctor or other care provider tells you to start again. 02/01/24   [name], MD  Insulin  Syringes, Disposable, U-100 0.5 ML MISC 4x daily injections 03/21/18   Riccio, Angela C, DO  isosorbide  dinitrate (ISORDIL ) 20 MG tablet Take 20 mg by mouth daily.    [provider]  Lancet Devices (ACCU-CHEK [facility]) lancets Fill for 1 month for TID testing. Use as instructed 11/13/13   Curtis Hadassah DASEN, MD  latanoprost  (XALATAN ) 0.005 % ophthalmic solution Place 1 drop into both eyes at bedtime.    [provider]  leptospermum manuka honey (MEDIHONEY) PSTE paste Apply 1 Application topically 2 (two) times daily. Apply with wound changes IF santyl  not available. 06/09/24   King, Olivia, DO  melatonin 5 MG TABS Take 10 mg by mouth at bedtime as needed (for insomnia).    [provider]  metoprolol  tartrate (LOPRESSOR ) 25 MG tablet Take 1 tablet (25 mg total) by mouth 2 (two) times daily. 06/09/24   King, Olivia, DO  midodrine  (PROAMATINE ) 2.5 MG tablet Take 2.5 mg by mouth daily as needed (low bp).    [provider]  Multiple Vitamin (MULTIVITAMIN WITH MINERALS) TABS Take 1 tablet by mouth daily.    [provider]  NEEDLE, DISP, 30 G (BD DISP NEEDLES) 30G X 1/2 MISC For 4x daily injections 11/05/15   Phelps, Jazma Y, DO  [Paused] NOVOLIN R 100 UNIT/ML injection Inject 0-9 Units into the skin in the morning, at noon, and at bedtime. Per sliding scale Wait to take this until your doctor or other care provider tells you to start  again. 04/03/24   [provider]  nutrition supplement, JUVEN, (JUVEN) PACK Take 1 packet by mouth 2 (two) times daily between meals. 06/09/24   King, Olivia, DO  oxyCODONE  (OXY IR/ROXICODONE )  5 MG immediate release tablet Take 1 tablet (5 mg total) by mouth every 6 (six) hours as needed for moderate pain (pain score 4-6). 06/30/24   Harden Jerona GAILS, MD  zinc  sulfate, 50mg  elemental zinc , 220 (50 Zn) MG capsule Take 1 capsule (220 mg total) by mouth daily. 06/10/24   Myrna Bitters, DO    Physical Exam: BP 115/87 (BP Location: Right Arm)   Pulse 80   Temp 98.2 F (36.8 C) (Oral)   Resp 18   Ht 5' 7 (1.702 m)   Wt 57.6 kg   SpO2 100%   BMI 19.89 kg/m   General: 69 y.o. year-old female well developed well nourished in no acute distress.  Alert and oriented x3. Cardiovascular: Regular rate and rhythm with no rubs or gallops.  No thyromegaly or JVD noted.  No lower extremity edema. 2/4 pulses in all 4 extremities. Respiratory: Clear to auscultation with no wheezes or rales. Good inspiratory effort. Abdomen: Soft nontender nondistended with normal bowel sounds x4 quadrants. Muskuloskeletal: No cyanosis, clubbing or edema noted bilaterally Neuro: CN II-XII intact, strength, sensation, reflexes Skin: No ulcerative lesions noted or rashes Psychiatry: Judgement and insight appear normal. Mood is appropriate for condition and setting          Labs on Admission:  Basic Metabolic Panel: Recent Labs  Lab 10/05/24 2026  NA 140  K 3.6  CL 100  CO2 28  GLUCOSE 249*  BUN 22  CREATININE 4.13*  CALCIUM  8.6*   Liver Function Tests: No results for input(s): AST, ALT, ALKPHOS, BILITOT, PROT, ALBUMIN  in the last 168 hours. No results for input(s): LIPASE, AMYLASE in the last 168 hours. No results for input(s): AMMONIA in the last 168 hours. CBC: Recent Labs  Lab 10/05/24 2026  WBC 12.7*  NEUTROABS 10.3*  HGB 8.7*  HCT 28.3*  MCV 85.5  PLT 334   Cardiac  Enzymes: No results for input(s): CKTOTAL, CKMB, CKMBINDEX, TROPONINI in the last 168 hours.  BNP (last 3 results) Recent Labs    11/04/23 0209 11/05/23 0247 11/06/23 0345  BNP 74.4 179.0* 244.4*    ProBNP (last 3 results) No results for input(s): PROBNP in the last 8760 hours.  CBG: Recent Labs  Lab 10/05/24 2025  GLUCAP 198*    Radiological Exams on Admission: MR BRAIN WO CONTRAST Result Date: 10/05/2024 CLINICAL DATA:  Initial evaluation for acute left hand numbness. EXAM: MRI HEAD WITHOUT CONTRAST TECHNIQUE: Multiplanar, multiecho pulse sequences of the brain and surrounding structures were obtained without intravenous contrast. COMPARISON:  Prior exam from 02/01/2024. FINDINGS: Brain: Generalized age-related cerebral atrophy. Patchy T2/FLAIR hyperintensity involving the supratentorial cerebral white matter and pons, consistent chronic small vessel ischemic disease. Few small remote lacunar infarcts about the bilateral basal ganglia and thalami. Scattered restricted diffusion involving the posterior right parietal, temporal, and occipital lobes. Associated FLAIR signal intensity within this region. Findings consistent with an acute to early subacute infarct, right PCA and/or MCA-PCA watershed distribution. Mild petechial hemorrhage without frank hemorrhagic transformation. No significant mass effect. Gray-white matter differentiation otherwise maintained. No other acute or chronic intracranial hemorrhage. No mass lesion or midline shift. No hydrocephalus or extra-axial fluid collection. Pituitary gland within normal limits. Vascular: Major intracranial vascular flow voids are maintained. Skull and upper cervical spine: Cranial junction within normal limits. Bone marrow signal intensity within normal limits. No scalp soft tissue abnormality. Sinuses/Orbits: Prior bilateral ocular lens replacement. Left gaze preference noted. Left maxillary sinus retention cyst noted. Paranasal  sinuses are otherwise  largely clear. Trace bilateral mastoid effusions, of doubtful significance. Other: None. IMPRESSION: 1. Acute to early subacute infarct involving the posterior right cerebral hemispheres above. Mild petechial hemorrhage without frank hemorrhagic transformation or significant mass effect. 2. Underlying age-related cerebral atrophy with changes of chronic microvascular ischemic disease. Electronically Signed   By: Morene Hoard M.D.   On: 10/05/2024 23:39    EKG: I independently viewed the EKG done and my findings are as followed: None available at the time of this visit.  Assessment/Plan Present on Admission:  CVA (cerebral vascular accident) [facility])  Principal Problem:   CVA (cerebral vascular accident) (HCC)  Acute to subacute CVA, POA Involving right PCA Aspirin  81 mg daily as recommended by neurology Continue to hold off Eliquis , restart when okay with neurology. Continue stroke workup  Type 2 diabetes with hyperglycemia Presented with serum glucose of 249 Follow A1c Goal A1c less than 7.0 Insulin  coverage  Paroxysmal A-fib on Eliquis  Home Eliquis  on hold Currently rate controlled Continue to monitor on telemetry  ESRD on HD MWF HD on 10/05/2024 Volume status and electrolytes managed with hemodialysis.  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   Right PCA remains occluded for short segment but is patent and perfused distally.  This could be due to subocclusive thrombus and or recanalized clot. Continue home Eliquis   Enlarged multinodular thyroid , seen on CT scan  This has been previously evaluated by thyroid  ultrasound and biopsy in 2013. TSH 1.046  (11/03/23)   Time: 75 minutes.   DVT prophylaxis: SCDs.  Code Status: Full code.  Family Communication: None at bedside.  Disposition Plan: Admitted to telemetry unit.  Consults called: Neurology.  Admission status: Observation status.   Status is: Observation    Terry LOISE Hurst MD [facility] Pager [phone]  If 7PM-7AM, please contact night-coverage [email or web address] Password [facility]  10/06/2024, 12:05 AM      [1]  Allergies Allergen Reactions   Nsaids Other (See Comments)    CKD stage 4   Lisinopril Other (See Comments)    coughing   Peanut-Containing Drug Products Itching and Other (See Comments)    GI intolerance - diarrhea   "

## 2024-10-06 NOTE — ED Notes (Signed)
 Patient transported to CT

## 2024-10-06 NOTE — Evaluation (Signed)
 Occupational Therapy Evaluation Patient Details Name: [name] MRN: [id number] DOB: [birth year] Today's Date: 10/06/2024   History of Present Illness   [name] is a 69 y.o. female presented to ER with left hand numbness, dizziness, and headache (occurring while at dialysis). 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 Pt currently is setup-min A for basic ADLs (excluding prothesis--needs more A) at RW level and has potential to get to a Mod I level from RW with increased education and therapy for ADLs and mobility with new prothesis. Pt will benefit from continued acute OT with follow up from continued inpatient follow up therapy, <3 hours/day.      If plan is discharge home, recommend the following:   A little help with walking and/or transfers;A little help with bathing/dressing/bathroom;Help with stairs or ramp for entrance;Assist for transportation     Functional Status Assessment   Patient has had a recent decline in their functional status and demonstrates the ability to make significant improvements in function in a reasonable and predictable amount of time.     Equipment Recommendations   None recommended by OT      Precautions/Restrictions   Precautions Precautions: Fall Recall of Precautions/Restrictions: Impaired Required Braces or Orthoses:  (RLE prothestic) Restrictions Weight Bearing Restrictions Per  Provider Order: No     Mobility Bed Mobility Overal bed mobility: Needs Assistance Bed Mobility: Sit to Supine       Sit to supine: Min assist   General bed mobility comments: Min A for LE back to bed    Transfers Overall transfer level: Needs assistance Equipment used: Rolling walker (2 wheels) Transfers: Sit to/from Stand Sit to Stand: Contact guard assist, From elevated surface                  Balance Overall balance assessment: Needs assistance (sitting and standing)                                         ADL either performed or assessed with clinical judgement   ADL Overall ADL's : Needs assistance/impaired Eating/Feeding: Set up;Sitting   Grooming: Set up;Sitting   Upper Body Bathing: Set up;Sitting   Lower Body Bathing: Minimal assistance;Sit to/from stand   Upper Body Dressing : Set up;Sitting   Lower Body Dressing: Minimal assistance;Sit to/from stand Lower Body Dressing Details (indicate cue type and reason): except total A for RLE prothesis (feel she can get to an independent level with more education/hands on opportunities) Toilet Transfer: Minimal assistance;Ambulation;Rolling walker (2 wheels) Toilet Transfer Details (indicate cue type and reason): simulated ambulating 6 feet then sitting in chair pulled up behind her Toileting- Clothing Manipulation and Hygiene: Minimal assistance;Sit to/from stand               Vision Baseline Vision/History: 2 Legally blind Ability to See in Adequate Light: 3 Highly impaired Patient  Visual Report: No change from baseline Additional Comments: sees light/dark and shapes            Pertinent Vitals/Pain Pain Assessment Pain Assessment: No/denies pain     Extremity/Trunk Assessment Upper Extremity Assessment Upper Extremity Assessment: Defer to OT evaluation      Communication Communication Communication: No apparent difficulties   Cognition Arousal: Alert Behavior During  Therapy: WFL for tasks assessed/performed               OT - Cognition Comments: confusing discussion on how much she is actually ambulating with her prothesis                 Following commands: Intact       Cueing  General Comments   Cueing Techniques: Verbal cues;Gestural cues  BP remained stable during activity. Pt reports dizziness in standing.           Home Living Family/patient expects to be discharged to:: Private residence Living Arrangements: Children (3 sons) Available Help at Discharge: Family;Available 24 hours/day Type of Home: House Home Access: Level entry     Home Layout: Multi-level Alternate Level Stairs-Number of Steps: 5 Alternate Level Stairs-Rails: Left Bathroom Shower/Tub: Producer, Television/film/video: Standard Bathroom Accessibility: No   Home Equipment: Cane - single point;Wheelchair - Forensic Psychologist (2 wheels);Shower seat;BSC/3in1;Lift chair;Hospital bed          Prior Functioning/Environment Prior Level of Function : Needs assist             Mobility Comments: Pt confusing about how she mobilizes, states she has not been walking long (prothesis in Dec) but then states she does up and down steps using prothesis. Reports one of her sons is always with her when she is up and about walking ADLs Comments: set up for ADLs, uses BSC, sons do wound care and IADLs    OT Problem List: Impaired balance (sitting and/or standing);Impaired vision/perception   OT Treatment/Interventions: Self-care/ADL training;DME and/or AE instruction;Balance training;Patient/family education      OT Goals(Current goals can be found in the care plan section)   Acute Rehab OT Goals Patient Stated Goal: to go to rehab and feel better and safer with being up and about with my prothesis I got in December OT Goal Formulation: With patient Time For Goal Achievement: 10/20/24 Potential to Achieve Goals: Good   OT Frequency:  Min  2X/week       AM-PAC OT 6 Clicks Daily Activity     Outcome Measure Help from another person eating meals?: A Little Help from another person taking care of personal grooming?: A Little Help from another person toileting, which includes using toliet, bedpan, or urinal?: A Little Help from another person bathing (including washing, rinsing, drying)?: A Little   Help from another person to put on and taking off regular lower body clothing?: A Little 6 Click Score: 15   End of Session Equipment Utilized During Treatment: Gait belt;Rolling walker (2 wheels)  Activity Tolerance: Patient tolerated treatment well Patient left: in bed;with call bell/phone within reach  OT Visit Diagnosis: Unsteadiness on feet (R26.81);Other abnormalities of gait and mobility (R26.89);Muscle weakness (generalized) (M62.81);Low vision, both eyes (H54.2)                Time: 1050-1103 OT Time Calculation (min): 13 min Charges:  OT General Charges $OT Visit: 1 Visit OT Evaluation $OT Eval Moderate Complexity: 1 Mod  Cathy L. OT Acute Rehabilitation Services Office [phone]   [place],  Dorothyann Distel 10/06/2024, 12:01 PM

## 2024-10-06 NOTE — Progress Notes (Addendum)
 STROKE TEAM PROGRESS NOTE   INTERIM HISTORY/SUBJECTIVE Seen in ED with hospitalist at the bedside. No family present. Had dialysis yesterday and then noticed her symptoms. She takes her eliquis  as prescribed regularly and has not missed any doses.SABRA  MRI scan of the brain shows patchy right PCA distribution infarct involving occipital and parietal lobes. CT angiogram shows right PCA occlusion OBJECTIVE  CBC    Component Value Date/Time   WBC 11.2 (H) 10/06/2024 0627   RBC 3.48 (L) 10/06/2024 0627   HGB 9.0 (L) 10/06/2024 0627   HGB 9.6 (L) 02/28/2021 1548   HCT 30.0 (L) 10/06/2024 0627   HCT 30.7 (L) 02/28/2021 1548   PLT 367 10/06/2024 0627   PLT 432 02/28/2021 1548   MCV 86.2 10/06/2024 0627   MCV 79 02/28/2021 1548   MCH 25.9 (L) 10/06/2024 0627   MCHC 30.0 10/06/2024 0627   RDW 19.7 (H) 10/06/2024 0627   RDW 15.0 02/28/2021 1548   LYMPHSABS 1.1 10/06/2024 0627   MONOABS 1.3 (H) 10/06/2024 0627   EOSABS 0.3 10/06/2024 0627   BASOSABS 0.1 10/06/2024 0627    BMET    Component Value Date/Time   NA 139 10/06/2024 0627   NA 137 02/28/2021 1548   K 3.9 10/06/2024 0627   CL 101 10/06/2024 0627   CO2 28 10/06/2024 0627   GLUCOSE 240 (H) 10/06/2024 0627   BUN 29 (H) 10/06/2024 0627   BUN 33 (H) 02/28/2021 1548   CREATININE 5.31 (H) 10/06/2024 0627   CREATININE 3.38 (H) 12/26/2015 1347   CALCIUM  8.8 (L) 10/06/2024 0627   EGFR 9 04/10/2023 1059   GFRNONAA 8 (L) 10/06/2024 0627   GFRNONAA 13 (L) 01/10/2015 1428    IMAGING past 24 hours CT ANGIO HEAD NECK W WO CM Result Date: 10/06/2024 CLINICAL DATA:  Follow-up examination for stroke. EXAM: CT ANGIOGRAPHY HEAD AND NECK WITH AND WITHOUT CONTRAST TECHNIQUE: Multidetector CT imaging of the head and neck was performed using the standard protocol during bolus administration of intravenous contrast. Multiplanar CT image reconstructions and MIPs were obtained to evaluate the vascular anatomy. Carotid stenosis measurements (when  applicable) are obtained utilizing NASCET criteria, using the distal internal carotid diameter as the denominator. RADIATION DOSE REDUCTION: This exam was performed according to the departmental dose-optimization program which includes automated exposure control, adjustment of the mA and/or kV according to patient size and/or use of iterative reconstruction technique. CONTRAST:  75mL OMNIPAQUE  IOHEXOL  350 MG/ML SOLN COMPARISON:  MRI from 10/05/2024. FINDINGS: CT HEAD FINDINGS Brain: Age-related cerebral atrophy with chronic microvascular ischemic disease. Previously identified acute to early subacute infarct involving the posterior right cerebral hemisphere, better evaluated on prior MRI. No associated hemorrhage by CT. No significant mass effect. No other acute large vessel territory infarct. No mass lesion or midline shift. No hydrocephalus or extra-axial fluid collection. Vascular: No abnormal hyperdense vessel. Scattered vascular calcifications noted within the carotid siphons. Skull: Scalp soft tissues within normal limits.  Calvarium intact. Sinuses/Orbits: Left gaze noted. Prior bilateral ocular lens replacement. Mild mucosal thickening about the ethmoidal air cells and maxillary sinuses with small left maxillary sinus retention cyst. Trace left mastoid effusion, of doubtful significance. Other: None. Review of the MIP images confirms the above findings CTA NECK FINDINGS Aortic arch: Visualized arch within normal limits for caliber standard branch pattern. Aortic atherosclerosis. No significant stenosis about the origin of the great vessels. Right carotid system: Right common and internal carotid arteries are tortuous but patent without dissection. Calcified plaque about the right  carotid bulb without hemodynamically significant greater than 50% stenosis. Left carotid system: Left common and internal carotid arteries are tortuous but patent without dissection. Atheromatous plaque about the left carotid bulb  without hemodynamically significant greater than 50% stenosis. Vertebral arteries: Both vertebral arteries arise from the subclavian arteries. Mixed atheromatous plaque about the proximal left subclavian artery with estimated 50% stenosis (series 9, image 303). Left vertebral artery dominant. Vertebral arteries M cells patent without stenosis or dissection. Skeleton: No worrisome osseous lesions. Moderate spondylosis present at C5-6 and C6-7. Other neck: No acute finding. Enlarged heterogeneous multinodular thyroid , consistent with goiter. This has been previously evaluated by thyroid  ultrasound and biopsy in 2013. Upper chest: Mild ground-glass density within the partially visualized lungs, likely mild pulmonary interstitial congestion/edema. No other acute finding. Left-sided central venous catheter in place. Review of the MIP images confirms the above findings CTA HEAD FINDINGS Anterior circulation: Atheromatous change throughout the carotid siphons with secondary mild to moderate diffuse narrowing bilaterally. A1 segments patent bilaterally. Normal anterior communicating complex. Anterior cerebral arteries patent without stenosis. No M1 stenosis or occlusion. No proximal MCA branch occlusion or high-grade stenosis. Distal MCA branches perfused and symmetric. Distal small vessel atheromatous irregularity. Posterior circulation: Both V4 segments patent without stenosis. Both PICA patent. Basilar patent without stenosis. Superior cerebral arteries patent bilaterally. Both PCA supplied via the basilar. Left PCA patent without significant stenosis. There is occlusion of the right PCA at the proximal right P2 segment (series 14, image 21). Right PCA remains occluded for a short segment but appears perfused distally (series 12, image 173). This could be due to subocclusive thrombus and/or recanalized clot. Venous sinuses: Patent allowing for timing the contrast bolus. Anatomic variants: None significant.  No aneurysm.  Review of the MIP images confirms the above findings IMPRESSION: CT HEAD: 1. Evolving acute to early subacute infarct involving the right cerebral hemisphere, better evaluated on prior MRI. No associated hemorrhage by CT. No significant regional mass effect. 2. Underlying age-related cerebral atrophy with chronic microvascular ischemic disease. CTA HEAD AND NECK: 1. Occlusion of the right PCA at the proximal right P2 segment. Right PCA remains occluded for a short segment but is patent and perfused distally. This could be due to subocclusive thrombus and/or recanalized clot. 2. Atheromatous change about the carotid siphons with secondary mild to moderate narrowing bilaterally. 3. Atheromatous plaque about the proximal left subclavian artery with estimated 50% stenosis. 4. Mild ground-glass density within the visualized lungs, likely mild pulmonary interstitial congestion/edema. 5. Enlarged multinodular thyroid . This has been previously evaluated by thyroid  ultrasound and biopsy in 2013. Please refer to these exams and results regarding any potential follow-up recommendations regarding this finding. Aortic Atherosclerosis (ICD10-I70.0). Electronically Signed   By: Morene Hoard M.D.   On: 10/06/2024 02:15   MR BRAIN WO CONTRAST Result Date: 10/05/2024 CLINICAL DATA:  Initial evaluation for acute left hand numbness. EXAM: MRI HEAD WITHOUT CONTRAST TECHNIQUE: Multiplanar, multiecho pulse sequences of the brain and surrounding structures were obtained without intravenous contrast. COMPARISON:  Prior exam from 02/01/2024. FINDINGS: Brain: Generalized age-related cerebral atrophy. Patchy T2/FLAIR hyperintensity involving the supratentorial cerebral white matter and pons, consistent chronic small vessel ischemic disease. Few small remote lacunar infarcts about the bilateral basal ganglia and thalami. Scattered restricted diffusion involving the posterior right parietal, temporal, and occipital lobes. Associated  FLAIR signal intensity within this region. Findings consistent with an acute to early subacute infarct, right PCA and/or MCA-PCA watershed distribution. Mild petechial hemorrhage without frank hemorrhagic transformation. No significant  mass effect. Gray-white matter differentiation otherwise maintained. No other acute or chronic intracranial hemorrhage. No mass lesion or midline shift. No hydrocephalus or extra-axial fluid collection. Pituitary gland within normal limits. Vascular: Major intracranial vascular flow voids are maintained. Skull and upper cervical spine: Cranial junction within normal limits. Bone marrow signal intensity within normal limits. No scalp soft tissue abnormality. Sinuses/Orbits: Prior bilateral ocular lens replacement. Left gaze preference noted. Left maxillary sinus retention cyst noted. Paranasal sinuses are otherwise largely clear. Trace bilateral mastoid effusions, of doubtful significance. Other: None. IMPRESSION: 1. Acute to early subacute infarct involving the posterior right cerebral hemispheres above. Mild petechial hemorrhage without frank hemorrhagic transformation or significant mass effect. 2. Underlying age-related cerebral atrophy with changes of chronic microvascular ischemic disease. Electronically Signed   By: Morene Hoard M.D.   On: 10/05/2024 23:39    Vitals:   10/06/24 0043 10/06/24 0215 10/06/24 0230 10/06/24 0717  BP:  (!) 112/57 (!) 112/57 119/69  Pulse:   79 74  Resp:   18 16  Temp: 98.4 F (36.9 C)   98 F (36.7 C)  TempSrc: Oral   Oral  SpO2:   100% 100%  Weight:      Height:         PHYSICAL EXAM General:  Alert, well-nourished, well-developed patient in no acute distress Psych:  Mood and affect appropriate for situation CV: Regular rate and rhythm on monitor Respiratory:  Regular, unlabored respirations on room air GI: Abdomen soft and nontender   NEURO:  Mental Status: AA&Ox3, patient is able to give clear and coherent  history Speech/Language: speech is without dysarthria or aphasia.  Naming, repetition, fluency, and comprehension intact.  Cranial Nerves:  II: PERRL.  III, IV, VI: EOMI. Eyelids elevate symmetrically.  Left partial minimal hemianopsia V: Sensation is intact to light touch and symmetrical to face.  VII: Face is symmetrical resting and smiling VIII: hearing intact to voice. IX, X: Palate elevates symmetrically. Phonation is normal.  KP:Dynloizm shrug 5/5. XII: tongue is midline without fasciculations. Motor: 5/5 strength to all muscle groups tested.  Tone: is normal and bulk is normal Sensation- Endorses continued numbness in the left hand  Coordination: FTN intact bilaterally, HKS: no ataxia in BLE.No drift.  Gait- deferred  Most Recent NIH 3     ASSESSMENT/PLAN  Ms. MACIAH FEEBACK is a 69 y.o. 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    Acute Ischemic Infarct:  right posterior cerebral hemisphere infarct Etiology: Likely embolic from A-fib despite anticoagulation with Eliquis  Code Stroke CT head Evolving acute to early subacute infarct involving the right cerebral hemisphere, better evaluated on prior MRI.  CTA head & neck Occlusion of the right PCA at the proximal right P2 segment. Right PCA remains occluded for a short segment but is patent and perfused distally. This could be due to subocclusive thrombus and/or recanalized clot. MRI   Acute to early subacute infarct involving the posterior right cerebral hemispheres above. Mild petechial hemorrhage without frank hemorrhagic transformation or significant mass effect. 2D Echo 60-65% LDL 110 HgbA1c 5.1 VTE prophylaxis - SCDs Eliquis  (apixaban ) daily prior to admission, now on aspirin  81 mg daily and resume eliquis  1/14 Therapy recommendations: Home with home health PT/OT Disposition:  pending   Atrial  fibrillation Home Meds: Eliquis   Continue telemetry monitoring Resume tomorrow morning   Hyperlipidemia LDL 110, goal < 70 Add atorvastatin    Continue  statin at discharge  Other Stroke Risk Factors ETOH use, alcohol  level No results found for requested labs within last 1095 days., advised to drink no more than 1 drink(s) a day  ESRD on HD MWF Volume status and electrolytes stable with HD  Leukocytosis WBC 12.7  Hospital day # 0   Patient seen and examined by NP/APP with MD. MD to update note as needed.   Jorene Last, DNP, FNP-BC [facility] Pager: [phone]  I have personally obtained history,examined this patient, reviewed notes, independently viewed imaging studies, participated in medical decision making and plan of care.ROS completed by me personally and pertinent positives fully documented  I have made any additions or clarifications directly to the above note. Agree with note above.  Patient with atrial fibrillation has been compliant with Eliquis  presents with right PCA infarct due to right PCA occlusion likely embolic from A-fib despite anticoagulation.  Long discussion with patient about alternatives to Eliquis  and given her end-stage renal disease we do not have any other alternatives and I would not recommend switching to warfarin due to the hassles involved.  Continue Eliquis  for stroke prevention and maintain aggressive risk factor modification.  .  No family available at the bedside for discussion.  Discussed with Dr. Lue.   I personally spent a total of 50 minutes in the care of the patient today including getting/reviewing separately obtained history, performing a medically appropriate exam/evaluation, counseling and educating, placing orders, referring and communicating with other health care professionals, documenting clinical information in the EHR, independently interpreting results, and coordinating care.         Eather Popp,  MD Medical Director [facility] Pager: [phone] 10/06/2024 3:17 PM  To contact Stroke Continuity provider, please refer to [email or web address]. After hours, contact General Neurology

## 2024-10-06 NOTE — Progress Notes (Signed)
 CSW informed that pt has decided not to go to the facility. CSW spoke with pts son, who is home and states he will assist with pt care. [facility] may be contacted to transport pt home. CSW will notify facility of pts decision.

## 2024-10-06 NOTE — Evaluation (Signed)
 Physical Therapy Evaluation Patient Details Name: [name] MRN: [id number] DOB: 05-Apr-1956 Today's Date: 10/06/2024  History of Present Illness  [name] is a 69 y.o. female presented to ER with left hand numbness, dizziness, and headache (occurring while at dialysis). 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 Pt previously was able to navigate steps with sons at home. Due to pt current functional status, home set up and available assistance at home recommending skilled physical therapy services < 3 hours/day in order to address strength, balance and functional mobility to decrease risk for falls, injury, immobility, skin break down and re-hospitalization.          If plan is discharge home, recommend the following: A little help with walking and/or transfers;Help with stairs or ramp for entrance;Assistance with cooking/housework;Assist for transportation;Supervision due to cognitive status   Can travel by private vehicle   Yes    Equipment Recommendations None recommended by PT     Functional Status Assessment Patient has had a recent decline in their functional status and demonstrates the ability to make significant improvements in function in a reasonable and predictable amount of time.     Precautions / Restrictions Precautions Precautions: Fall Recall of Precautions/Restrictions: Impaired Required Braces or Orthoses: Other Brace Other Brace: RLE prosthetic Restrictions Weight Bearing Restrictions Per Provider Order: No       Mobility  Bed Mobility Overal bed mobility: Needs Assistance Bed Mobility: Supine to Sit, Sit to Supine     Supine to sit: Contact guard Sit to supine: Min assist   General bed mobility comments: CGA for trunk to midline, Min A for LE back to bed.    Transfers Overall transfer level: Needs assistance Equipment used: Rolling walker (2 wheels) (prosthetic) Transfers: Sit to/from Stand Sit to Stand: Contact guard assist, From elevated surface           General transfer comment: Initially on standing pt on lateral side of L foot    Ambulation/Gait Ambulation/Gait assistance: Min assist Gait Distance (Feet): 6 Feet Assistive device: Rolling walker (2 wheels) (prosthetic) Gait Pattern/deviations: Narrow base of support, Step-to pattern, Step-through pattern, Decreased stance time - right Gait velocity: decreased Gait velocity interpretation: <1.31 ft/sec, indicative of household ambulator   General Gait Details: Initially pt was standing on the lateral side of her L foot. Pt then was cued to correct with very narrow BOS and almost tandem stepping. Min A for balance. pt was able to ambulate 6 ft with RW with Min A for navigation of AD, strength and Balance.     Balance Overall balance assessment: Needs assistance Sitting-balance support: Single extremity supported, Feet unsupported Sitting balance-Leahy Scale: Fair     Standing balance support: Bilateral upper extremity supported, During functional activity, Reliant on assistive device for balance Standing balance-Leahy Scale: Poor Standing balance comment: Min A external support.         Pertinent Vitals/Pain Pain Assessment Pain Assessment: No/denies pain    Home Living Family/patient expects to be discharged to:: Private residence Living Arrangements: Children (3 sons) Available Help at Discharge: Family;Available 24 hours/day  Type of Home: House Home Access: Level entry     Alternate Level Stairs-Number  of Steps: 5 Home Layout: Multi-level Home Equipment: Cane - single point;Wheelchair - Forensic Psychologist (2 wheels);Shower seat;BSC/3in1;Lift chair;Hospital bed      Prior Function Prior Level of Function : Needs assist             Mobility Comments: Pt confusing about how she mobilizes, states she has not been walking long (prothesis in Dec) but then states she does up and down steps using prothesis. Reports one of her sons is always with her when she is up and about walking ADLs Comments: set up for ADLs, uses BSC, sons do wound care and IADLs     Extremity/Trunk Assessment   Upper Extremity Assessment Upper Extremity Assessment: Defer to OT evaluation    Lower Extremity Assessment Lower Extremity Assessment: Generalized weakness;RLE deficits/detail RLE Deficits / Details: prosthetic for BKA    Cervical / Trunk Assessment Cervical / Trunk Assessment: Normal  Communication   Communication Communication: No apparent difficulties    Cognition Arousal: Alert Behavior During Therapy: WFL for tasks assessed/performed   PT - Cognitive impairments: No family/caregiver present to determine baseline, Safety/Judgement   PT - Cognition Comments: poor historian at times Following commands: Intact       Cueing Cueing Techniques: Verbal cues, Gestural cues     General Comments General comments (skin integrity, edema, etc.): BP remained stable during activity. Pt reports dizziness in standing.        Assessment/Plan    PT Assessment Patient needs continued PT services  PT Problem List Decreased strength;Decreased balance;Decreased mobility       PT Treatment Interventions DME instruction;Therapeutic activities;Gait training;Therapeutic exercise;Stair training;Balance training;Functional mobility training;Neuromuscular re-education;Patient/family education    PT Goals (Current goals can be found in the Care Plan section)  Acute Rehab PT Goals Patient Stated Goal:  improve mobility to help sons more PT Goal Formulation: With patient Time For Goal Achievement: 10/20/24 Potential to Achieve Goals: Good    Frequency Min 2X/week        AM-PAC PT 6 Clicks Mobility  Outcome Measure Help needed turning from your back to your side while in a flat bed without using bedrails?: A Little Help needed moving from lying on your back to sitting on the side of a flat bed without using bedrails?: A Little Help needed moving to and from a bed to a chair (including a wheelchair)?: A Little Help needed standing up from a chair using your arms (e.g., wheelchair or bedside chair)?: A Little Help needed to walk in hospital room?: A Lot Help needed climbing 3-5 steps with a railing? : Total 6 Click Score: 15    End of Session Equipment Utilized During Treatment: Gait belt Activity Tolerance: Patient tolerated treatment well Patient left: in bed;Other (comment) (in ED hall call bell not working. Pt aware) Nurse Communication: Mobility status PT Visit Diagnosis: Unsteadiness on feet (R26.81);Other abnormalities of gait and mobility (R26.89);Muscle weakness (generalized) (M62.81)    Time: 8962-8950 PT Time Calculation (min) (ACUTE ONLY): 12 min   Charges:   PT Evaluation $PT Eval Low Complexity: 1 Low   PT General Charges $$ ACUTE PT VISIT: 1 Visit         Dorothyann Maier, DPT, CLT  Acute Rehabilitation Services Office: [phone] (Secure chat preferred)   Dorothyann VEAR Maier 10/06/2024, 11:54 AM

## 2024-10-06 NOTE — ED Notes (Signed)
 Sacral wound vac dressing saturated and came off, wound care consult placed. Ostomy bag changed.

## 2024-10-06 NOTE — Consult Note (Signed)
 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  At time of visit patient has home KCI wound vac at bedside and reports she is planning to discharge home today.  This was confirmed with primary MD.  Will not place hospital KCI unit, however will replace dressing and attach to home unit for dc. Pressure Injury POA: Yes Measurement: 4 cm x 4 cm x 0.5 cm with 2 cm undermining from 12 o'clock to 6 o'clock Wound bed: 100 % pink, moist Drainage (amount, consistency, odor) serosanguinous Periwound: intact Dressing procedure/placement/frequency:  old NPWT dressing removed prior to [facility] arrival, did remove foam used for bridge which was still in place. Cleansed wound with normal saline Skin protected to the hip with VAC drape for foam bridge  Filled wound with  __1__ piece of black foam, foam bridge to R hip, barrier ring to lower aspect of wound to achieve seal Sealed NPWT dressing at HG Patient tolerated procedure well   Patient also with ostomy to RLQ, however is declining for WTA to visualize/ evaluate or assist with appliance change.  Patient reports she is familiar with ostomy management and has all needed supplies at home.  WOC team will not follow patient at this time.  Please re consult if new needs arise.  Thank you,  Doyal Polite, MSN, RN, [facility] WOC Team [phone] (Available Mon-Fri 0700-1500)

## 2024-10-06 NOTE — ED Notes (Signed)
 When you have time, Cleola Sink (sister) [phone] would like an update on pt. Status. Thank you, and would also like to speak with pt.

## 2024-10-06 NOTE — ED Notes (Signed)
 CCMD called.

## 2024-10-06 NOTE — Progress Notes (Signed)
 Transition of Care [facility]) - Inpatient Brief Assessment   Patient Details  Name: [name] MRN: [id number] Date of Birth: Oct 26, 1955  Transition of Care [facility]) CM/SW Contact:    Debarah Saunas, RN Phone Number: 10/06/2024, 12:25 PM   Clinical Narrative: Pt c2urrently active with [facility] for Home Health services RN and PT as confirmed by [facility] with Arna of [facility].  Pt will resume HH services of RN/PT.  No DME needs identified at this time.    Transition of Care Asessment: Insurance and Status: Insurance coverage has been reviewed Patient has primary care physician: Yes Home environment has been reviewed: from home with 3 sons (2 story) Prior level of function:: minimal assist; has cane - single point; wheelchair - manual; rolling walker (2 wheels); shower seat; BSC/3in1; lift chair; hospital bed Prior/Current Home Services: (P) Current home services [facility]) Social Drivers of Health Review: (P) SDOH reviewed no interventions necessary Readmission risk has been reviewed: (P) Yes Transition of care needs: (P) no transition of care needs at this time

## 2024-10-06 NOTE — Discharge Summary (Addendum)
 Physician Discharge Summary  [name] FMW:[id number] DOB: 1956/02/05 DOA: 10/05/2024  PCP: Campbell Reynolds, NP  Admit date: 10/05/2024 Discharge date: 10/06/2024  Admitted From: Home Disposition: Home - resume HHPT  Recommendations for Outpatient Follow-up:  Follow up with PCP in 1-2 weeks Follow-up with neurology as scheduled  Discharge Condition: Stable CODE STATUS: Full Diet recommendation: Low-salt low-fat diet  Brief/Interim Summary: [name] is a 69 y.o. 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  The timing of last known well is unclear.  EMS was activated and the patient was brought into the ER for further evaluation.   Patient admitted as above with acute CVA, confirmed on imaging with small petechial hemorrhage.  Given discussion with neurology okay to discontinue aspirin  and continue Eliquis  given A-fib.  Patient continues to work with PT, given recent amputation and new prosthetic as well as stroke her ambulatory status is not at baseline and otherwise stable and agreeable for discharge to skilled nursing facility.  Patient and familiy now requesting DC home to resume HHPT.  Discharge Diagnoses:  Principal Problem:   CVA (cerebral vascular accident) (HCC)  Acute to subacute CVA, POA Ambulatory dysfunction, acute on chronic Involving right PCA Confirmed on MRI and CTA with notable R PCA occlusion Aspirin  discontinued, okay to resume Eliquis  PT OT following, recommending SNF placement given diminished ambulation from baseline (complicated by recent new prosthetic)   Type 2 diabetes with hyperglycemia, well controlled A1c 5.1 Sliding scale  insulin , hypoglycemic protocol   Paroxysmal A-fib on Eliquis  Home Eliquis  on hold Currently rate controlled Continue to monitor on telemetry   ESRD on HD MWF HD on 10/05/2024 Volume status and electrolytes managed with hemodialysis.   Anemia of chronic disease in the setting of ESRD Hemoglobin 8.7 with MCV of 85 Continue to monitor H&H   Status post right below the knee amputation Fall precautions   Mild leukocytosis WBC 12.7 Afebrile and nontoxic-appearing Follow-up repeat CBC with differentials   Occlusion of the right PCA at the proximal right P2 segment.   Right PCA remains occluded for short segment but is patent and perfused distally.  This could be due to subocclusive thrombus and or recanalized clot. Continue home Eliquis    Enlarged multinodular thyroid , noted on CT scan  This has been previously evaluated by thyroid  ultrasound and biopsy in 2013. TSH 1.046 (11/03/23)  Discharge Instructions  Discharge Instructions     Call MD for:  difficulty breathing, headache or visual disturbances   Complete by: As directed    Call MD for:  extreme fatigue   Complete by: As directed    Call MD for:  hives   Complete by: As directed    Call MD for:  persistant dizziness or light-headedness   Complete by: As directed    Call MD for:  persistant nausea and vomiting   Complete by: As directed    Call MD for:  severe uncontrolled pain   Complete by: As directed    Call MD for:  temperature >100.4   Complete by: As directed    Diet renal/carb modified with fluid restriction   Complete by: As directed    Fluid restriction:   Increase activity slowly  Complete by: As directed       Allergies as of 10/06/2024       Reactions   Nsaids Other (See Comments)   CKD stage 4   Lisinopril Other (See Comments)   coughing   Peanut-containing Drug Products Itching, Other (See Comments)   GI intolerance - diarrhea        Medication List     STOP taking these  medications    carvedilol  25 MG tablet Commonly known as: COREG    isosorbide  dinitrate 20 MG tablet Commonly known as: ISORDIL    metoprolol  tartrate 25 MG tablet Commonly known as: LOPRESSOR        TAKE these medications    Accu-Chek Aviva Plus test strip Generic drug: glucose blood CHECK BLOOD GLUCOSE THREE TIMES DAILY   Accu-Chek Aviva Plus w/Device Kit CHECK BLOOD GLUCOSE THREE TIMES DAILY   accu-chek softclix lancets Fill for 1 month for TID testing. Use as instructed   acetaminophen  500 MG tablet Commonly known as: TYLENOL  Take 2 tablets (1,000 mg total) by mouth 4 (four) times daily.   albuterol  (2.5 MG/3ML) 0.083% nebulizer solution Commonly known as: PROVENTIL  Take 3 mLs (2.5 mg total) by nebulization every 2 (two) hours as needed for wheezing.   apixaban  5 MG Tabs tablet Commonly known as: ELIQUIS  Take 1 tablet (5 mg total) by mouth 2 (two) times daily.   ascorbic acid  500 MG tablet Commonly known as: VITAMIN C  Take 1 tablet (500 mg total) by mouth 2 (two) times daily.   atorvastatin  40 MG tablet Commonly known as: LIPITOR  Take 1 tablet (40 mg total) by mouth daily.   brimonidine  0.2 % ophthalmic solution Commonly known as: ALPHAGAN  Place 1 drop into both eyes 2 (two) times daily.   calcitRIOL  0.25 MCG capsule Commonly known as: ROCALTROL  TAKE 1 CAPSULE (0.25 MCG TOTAL) EVERY MONDAY, WEDNESDAY, AND FRIDAY What changed: See the new instructions.   collagenase  250 UNIT/GM ointment Commonly known as: SANTYL  Apply topically 2 (two) times daily.   Darbepoetin Alfa  200 MCG/0.4ML Sosy injection Commonly known as: ARANESP  Inject 0.4 mLs (200 mcg total) into the skin every Sunday at 6pm.   Droplet Insulin  Syringe 31G X 5/16 0.5 ML Misc Generic drug: Insulin  Syringe-Needle U-100 USE FOUR TIMES DAILY FOR INJECTIONS   HYDROmorphone  2 MG tablet Commonly known as: DILAUDID  Take 0.5 tablets (1 mg total) by mouth every 6 (six) hours as needed for severe  pain (pain score 7-10) or moderate pain (pain score 4-6).   insulin  glargine 100 UNIT/ML injection Commonly known as: Lantus  Inject 0.07 mLs (7 Units total) into the skin daily. Please schedule appointment before next refill.   Insulin  Syringes (Disposable) U-100 0.5 ML Misc 4x daily injections   latanoprost  0.005 % ophthalmic solution Commonly known as: XALATAN  Place 1 drop into both eyes at bedtime.   leptospermum manuka honey Pste paste Apply 1 Application topically 2 (two) times daily. Apply with wound changes IF santyl  not available.   melatonin 5 MG Tabs Take 10 mg by mouth at bedtime as needed (for insomnia).   midodrine  2.5 MG tablet Commonly known as: PROAMATINE  Take 2.5 mg by mouth daily as needed (low bp).   multivitamin with minerals Tabs tablet Take 1 tablet by mouth daily.   NEEDLE (DISP) 30 G 30G X 1/2 Misc Commonly known as: BD Disp Needles For 4x daily injections   NovoLIN R 100 UNIT/ML injection Generic drug: insulin  regular Inject 0-9 Units into the skin in the morning, at noon, and  at bedtime. Per sliding scale   NovoLOG  FlexPen 100 UNIT/ML FlexPen Generic drug: insulin  aspart 0-9 Units, Subcutaneous, 3 times daily with meals CBG < 70: Implement Hypoglycemia measures CBG 70 - 120: 0 units CBG 121 - 150: 1 unit CBG 151 - 200: 2 units CBG 201 - 250: 3 units CBG 251 - 300: 5 units CBG 301 - 350: 7 units CBG 351 - 400: 9 units CBG > 400: call MD   nutrition supplement (JUVEN) Pack Take 1 packet by mouth 2 (two) times daily between meals.   oxyCODONE  5 MG immediate release tablet Commonly known as: Oxy IR/ROXICODONE  Take 1 tablet (5 mg total) by mouth every 6 (six) hours as needed for moderate pain (pain score 4-6).   zinc  sulfate (50mg  elemental zinc ) 220 (50 Zn) MG capsule Take 1 capsule (220 mg total) by mouth daily.        Allergies[1]  Consultations: Neurology  Procedures/Studies: ECHOCARDIOGRAM COMPLETE Result Date: 10/06/2024     ECHOCARDIOGRAM REPORT   Patient Name:   KENDRA GRISSETT [facility] Date of Exam: 10/06/2024 Medical Rec #:  [id number]         Height:       67.0 in Accession #:    [id number]        Weight:       127.0 lb Date of Birth:  03-23-1956         BSA:          1.667 m Patient Age:    68 years          BP:           119/69 mmHg Patient Gender: F                 HR:           72 bpm. Exam Location:  Inpatient Procedure: 2D Echo, Cardiac Doppler and Color Doppler (Both Spectral and Color            Flow Doppler were utilized during procedure). Indications:    Stroke  History:        Patient has prior history of Echocardiogram examinations, most                 recent 04/20/2024. Stroke; Risk Factors:Hypertension, Diabetes                 and Dyslipidemia.  Sonographer:    Carmelita Hartshorn RDCS, FE, PE Referring Phys: [id number] CAROLE N HALL IMPRESSIONS  1. RV appears normal with normal RVSP compared with the prior study.  2. Left ventricular ejection fraction, by estimation, is 60 to 65%. The left ventricle has normal function. The left ventricle has no regional wall motion abnormalities. Left ventricular diastolic parameters are consistent with Grade I diastolic dysfunction (impaired relaxation).  3. Right ventricular systolic function is normal. The right ventricular size is normal. There is normal pulmonary artery systolic pressure. The estimated right ventricular systolic pressure is 28.0 mmHg.  4. Left atrial size was mildly dilated.  5. The mitral valve is grossly normal. Trivial mitral valve regurgitation. No evidence of mitral stenosis.  6. The aortic valve is tricuspid. Aortic valve regurgitation is not visualized. Aortic valve sclerosis is present, with no evidence of aortic valve stenosis.  7. The inferior vena cava is normal in size with greater than 50% respiratory variability, suggesting right atrial pressure of 3 mmHg. Comparison(s): Changes from prior study are noted. The right ventricular systolic function has improved.  FINDINGS  Left Ventricle: Left ventricular ejection fraction, by estimation, is 60 to 65%. The left ventricle has normal function. The left ventricle has no regional wall motion abnormalities. The left ventricular internal cavity size was normal in size. There is  no left ventricular hypertrophy. Left ventricular diastolic parameters are consistent with Grade I diastolic dysfunction (impaired relaxation). Right Ventricle: The right ventricular size is normal. No increase in right ventricular wall thickness. Right ventricular systolic function is normal. There is normal pulmonary artery systolic pressure. The tricuspid regurgitant velocity is 2.50 m/s, and  with an assumed right atrial pressure of 3 mmHg, the estimated right ventricular systolic pressure is 28.0 mmHg. Left Atrium: Left atrial size was mildly dilated. Right Atrium: Right atrial size was normal in size. Pericardium: There is no evidence of pericardial effusion. Mitral Valve: The mitral valve is grossly normal. Trivial mitral valve regurgitation. No evidence of mitral valve stenosis. Tricuspid Valve: The tricuspid valve is grossly normal. Tricuspid valve regurgitation is trivial. No evidence of tricuspid stenosis. Aortic Valve: The aortic valve is tricuspid. Aortic valve regurgitation is not visualized. Aortic valve sclerosis is present, with no evidence of aortic valve stenosis. Pulmonic Valve: The pulmonic valve was grossly normal. Pulmonic valve regurgitation is not visualized. No evidence of pulmonic stenosis. Aorta: The aortic root is normal in size and structure. Venous: The right lower pulmonary vein is normal. The inferior vena cava is normal in size with greater than 50% respiratory variability, suggesting right atrial pressure of 3 mmHg. IAS/Shunts: The atrial septum is grossly normal.  LEFT VENTRICLE PLAX 2D LVIDd:         4.20 cm   Diastology LVIDs:         2.70 cm   LV e' medial:    7.07 cm/s LV PW:         1.00 cm   LV E/e' medial:  12.0  LV IVS:        1.00 cm   LV e' lateral:   10.40 cm/s LVOT diam:     2.00 cm   LV E/e' lateral: 8.2 LV SV:         80 LV SV Index:   48 LVOT Area:     3.14 cm  RIGHT VENTRICLE RV Basal diam:  2.79 cm RV Mid diam:    1.83 cm RV S prime:     9.25 cm/s TAPSE (M-mode): 2.0 cm LEFT ATRIUM             Index LA diam:        3.64 cm 2.18 cm/m LA Vol (A2C):   36.3 ml 21.77 ml/m LA Vol (A4C):   60.4 ml 36.23 ml/m LA Biplane Vol: 49.9 ml 29.93 ml/m  AORTIC VALVE LVOT Vmax:   108.00 cm/s LVOT Vmean:  70.200 cm/s LVOT VTI:    0.254 m  AORTA Ao Root diam: 2.92 cm MITRAL VALVE               TRICUSPID VALVE MV Area (PHT): 3.17 cm    TR Peak grad:   25.0 mmHg MV Decel Time: 239 msec    TR Vmax:        250.00 cm/s MV E velocity: 84.90 cm/s MV A velocity: 96.40 cm/s  SHUNTS MV E/A ratio:  0.88        Systemic VTI:  0.25 m  Systemic Diam: 2.00 cm Connie Decent MD Electronically signed by Connie Decent MD Signature Date/Time: 10/06/2024/10:22:06 AM    Final    CT ANGIO HEAD NECK W WO CM Result Date: 10/06/2024 CLINICAL DATA:  Follow-up examination for stroke. EXAM: CT ANGIOGRAPHY HEAD AND NECK WITH AND WITHOUT CONTRAST TECHNIQUE: Multidetector CT imaging of the head and neck was performed using the standard protocol during bolus administration of intravenous contrast. Multiplanar CT image reconstructions and MIPs were obtained to evaluate the vascular anatomy. Carotid stenosis measurements (when applicable) are obtained utilizing NASCET criteria, using the distal internal carotid diameter as the denominator. RADIATION DOSE REDUCTION: This exam was performed according to the departmental dose-optimization program which includes automated exposure control, adjustment of the mA and/or kV according to patient size and/or use of iterative reconstruction technique. CONTRAST:  75mL OMNIPAQUE  IOHEXOL  350 MG/ML SOLN COMPARISON:  MRI from 10/05/2024. FINDINGS: CT HEAD FINDINGS Brain: Age-related cerebral atrophy  with chronic microvascular ischemic disease. Previously identified acute to early subacute infarct involving the posterior right cerebral hemisphere, better evaluated on prior MRI. No associated hemorrhage by CT. No significant mass effect. No other acute large vessel territory infarct. No mass lesion or midline shift. No hydrocephalus or extra-axial fluid collection. Vascular: No abnormal hyperdense vessel. Scattered vascular calcifications noted within the carotid siphons. Skull: Scalp soft tissues within normal limits.  Calvarium intact. Sinuses/Orbits: Left gaze noted. Prior bilateral ocular lens replacement. Mild mucosal thickening about the ethmoidal air cells and maxillary sinuses with small left maxillary sinus retention cyst. Trace left mastoid effusion, of doubtful significance. Other: None. Review of the MIP images confirms the above findings CTA NECK FINDINGS Aortic arch: Visualized arch within normal limits for caliber standard branch pattern. Aortic atherosclerosis. No significant stenosis about the origin of the great vessels. Right carotid system: Right common and internal carotid arteries are tortuous but patent without dissection. Calcified plaque about the right carotid bulb without hemodynamically significant greater than 50% stenosis. Left carotid system: Left common and internal carotid arteries are tortuous but patent without dissection. Atheromatous plaque about the left carotid bulb without hemodynamically significant greater than 50% stenosis. Vertebral arteries: Both vertebral arteries arise from the subclavian arteries. Mixed atheromatous plaque about the proximal left subclavian artery with estimated 50% stenosis (series 9, image 303). Left vertebral artery dominant. Vertebral arteries M cells patent without stenosis or dissection. Skeleton: No worrisome osseous lesions. Moderate spondylosis present at C5-6 and C6-7. Other neck: No acute finding. Enlarged heterogeneous multinodular  thyroid , consistent with goiter. This has been previously evaluated by thyroid  ultrasound and biopsy in 2013. Upper chest: Mild ground-glass density within the partially visualized lungs, likely mild pulmonary interstitial congestion/edema. No other acute finding. Left-sided central venous catheter in place. Review of the MIP images confirms the above findings CTA HEAD FINDINGS Anterior circulation: Atheromatous change throughout the carotid siphons with secondary mild to moderate diffuse narrowing bilaterally. A1 segments patent bilaterally. Normal anterior communicating complex. Anterior cerebral arteries patent without stenosis. No M1 stenosis or occlusion. No proximal MCA branch occlusion or high-grade stenosis. Distal MCA branches perfused and symmetric. Distal small vessel atheromatous irregularity. Posterior circulation: Both V4 segments patent without stenosis. Both PICA patent. Basilar patent without stenosis. Superior cerebral arteries patent bilaterally. Both PCA supplied via the basilar. Left PCA patent without significant stenosis. There is occlusion of the right PCA at the proximal right P2 segment (series 14, image 21). Right PCA remains occluded for a short segment but appears perfused distally (series 12, image 173). This could be  due to subocclusive thrombus and/or recanalized clot. Venous sinuses: Patent allowing for timing the contrast bolus. Anatomic variants: None significant.  No aneurysm. Review of the MIP images confirms the above findings IMPRESSION: CT HEAD: 1. Evolving acute to early subacute infarct involving the right cerebral hemisphere, better evaluated on prior MRI. No associated hemorrhage by CT. No significant regional mass effect. 2. Underlying age-related cerebral atrophy with chronic microvascular ischemic disease. CTA HEAD AND NECK: 1. Occlusion of the right PCA at the proximal right P2 segment. Right PCA remains occluded for a short segment but is patent and perfused distally.  This could be due to subocclusive thrombus and/or recanalized clot. 2. Atheromatous change about the carotid siphons with secondary mild to moderate narrowing bilaterally. 3. Atheromatous plaque about the proximal left subclavian artery with estimated 50% stenosis. 4. Mild ground-glass density within the visualized lungs, likely mild pulmonary interstitial congestion/edema. 5. Enlarged multinodular thyroid . This has been previously evaluated by thyroid  ultrasound and biopsy in 2013. Please refer to these exams and results regarding any potential follow-up recommendations regarding this finding. Aortic Atherosclerosis (ICD10-I70.0). Electronically Signed   By: Morene Hoard M.D.   On: 10/06/2024 02:15   MR BRAIN WO CONTRAST Result Date: 10/05/2024 CLINICAL DATA:  Initial evaluation for acute left hand numbness. EXAM: MRI HEAD WITHOUT CONTRAST TECHNIQUE: Multiplanar, multiecho pulse sequences of the brain and surrounding structures were obtained without intravenous contrast. COMPARISON:  Prior exam from 02/01/2024. FINDINGS: Brain: Generalized age-related cerebral atrophy. Patchy T2/FLAIR hyperintensity involving the supratentorial cerebral white matter and pons, consistent chronic small vessel ischemic disease. Few small remote lacunar infarcts about the bilateral basal ganglia and thalami. Scattered restricted diffusion involving the posterior right parietal, temporal, and occipital lobes. Associated FLAIR signal intensity within this region. Findings consistent with an acute to early subacute infarct, right PCA and/or MCA-PCA watershed distribution. Mild petechial hemorrhage without frank hemorrhagic transformation. No significant mass effect. Gray-white matter differentiation otherwise maintained. No other acute or chronic intracranial hemorrhage. No mass lesion or midline shift. No hydrocephalus or extra-axial fluid collection. Pituitary gland within normal limits. Vascular: Major intracranial vascular  flow voids are maintained. Skull and upper cervical spine: Cranial junction within normal limits. Bone marrow signal intensity within normal limits. No scalp soft tissue abnormality. Sinuses/Orbits: Prior bilateral ocular lens replacement. Left gaze preference noted. Left maxillary sinus retention cyst noted. Paranasal sinuses are otherwise largely clear. Trace bilateral mastoid effusions, of doubtful significance. Other: None. IMPRESSION: 1. Acute to early subacute infarct involving the posterior right cerebral hemispheres above. Mild petechial hemorrhage without frank hemorrhagic transformation or significant mass effect. 2. Underlying age-related cerebral atrophy with changes of chronic microvascular ischemic disease. Electronically Signed   By: Morene Hoard M.D.   On: 10/05/2024 23:39     Subjective: No acute issues or events overnight, less hand numbness slightly improved   Discharge Exam: Vitals:   10/06/24 0230 10/06/24 0717  BP: (!) 112/57 119/69  Pulse: 79 74  Resp: 18 16  Temp:  98 F (36.7 C)  SpO2: 100% 100%   Vitals:   10/06/24 0043 10/06/24 0215 10/06/24 0230 10/06/24 0717  BP:  (!) 112/57 (!) 112/57 119/69  Pulse:   79 74  Resp:   18 16  Temp: 98.4 F (36.9 C)   98 F (36.7 C)  TempSrc: Oral   Oral  SpO2:   100% 100%  Weight:      Height:        General: Pt is alert, awake, not in acute  distress Cardiovascular: RRR, S1/S2 +, no rubs, no gallops Respiratory: CTA bilaterally, no wheezing, no rhonchi Abdominal: Soft, NT, ND, bowel sounds + Extremities: no edema, no cyanosis; right BKA noted    The results of significant diagnostics from this hospitalization (including imaging, microbiology, ancillary and laboratory) are listed below for reference.     Microbiology: No results found for this or any previous visit (from the past 240 hours).   Labs: BNP (last 3 results) Recent Labs    11/04/23 0209 11/05/23 0247 11/06/23 0345  BNP 74.4 179.0*  244.4*   Basic Metabolic Panel: Recent Labs  Lab 10/05/24 2026 10/06/24 0627  NA 140 139  K 3.6 3.9  CL 100 101  CO2 28 28  GLUCOSE 249* 240*  BUN 22 29*  CREATININE 4.13* 5.31*  CALCIUM  8.6* 8.8*  PHOS  --  3.9   Liver Function Tests: Recent Labs  Lab 10/06/24 9372  ALBUMIN  3.1*   No results for input(s): LIPASE, AMYLASE in the last 168 hours. No results for input(s): AMMONIA in the last 168 hours. CBC: Recent Labs  Lab 10/05/24 2026 10/06/24 0627  WBC 12.7* 11.2*  NEUTROABS 10.3* 8.3*  HGB 8.7* 9.0*  HCT 28.3* 30.0*  MCV 85.5 86.2  PLT 334 367   Cardiac Enzymes: No results for input(s): CKTOTAL, CKMB, CKMBINDEX, TROPONINI in the last 168 hours. BNP: Invalid input(s): POCBNP CBG: Recent Labs  Lab 10/05/24 2025 10/06/24 0213  GLUCAP 198* 244*   D-Dimer No results for input(s): DDIMER in the last 72 hours. Hgb A1c No results for input(s): HGBA1C in the last 72 hours. Lipid Profile Recent Labs    10/06/24 0627  CHOL 182  HDL 44  LDLCALC 110*  TRIG 139  CHOLHDL 4.1   Thyroid  function studies No results for input(s): TSH, T4TOTAL, T3FREE, THYROIDAB in the last 72 hours.  Invalid input(s): FREET3 Anemia work up No results for input(s): VITAMINB12, FOLATE, FERRITIN, TIBC, IRON , RETICCTPCT in the last 72 hours. Urinalysis    Component Value Date/Time   COLORURINE YELLOW 10/28/2023 0416   APPEARANCEUR HAZY (A) 10/28/2023 0416   LABSPEC 1.010 10/28/2023 0416   PHURINE 7.0 10/28/2023 0416   GLUCOSEU 150 (A) 10/28/2023 0416   GLUCOSEU NEGATIVE 05/05/2021 1228   HGBUR NEGATIVE 10/28/2023 0416   BILIRUBINUR NEGATIVE 10/28/2023 0416   KETONESUR NEGATIVE 10/28/2023 0416   PROTEINUR >=300 (A) 10/28/2023 0416   UROBILINOGEN 0.2 05/05/2021 1228   NITRITE NEGATIVE 10/28/2023 0416   LEUKOCYTESUR MODERATE (A) 10/28/2023 0416   Sepsis Labs Recent Labs  Lab 10/05/24 2026 10/06/24 0627  WBC 12.7* 11.2*    Microbiology No results found for this or any previous visit (from the past 240 hours).   Time coordinating discharge: Over 30 minutes  SIGNED:   Elsie JAYSON [place], DO [facility] 10/06/2024, 11:45 AM Pager   If 7PM-7AM, please contact night-coverage [email or web address]     [1]  Allergies Allergen Reactions   Nsaids Other (See Comments)    CKD stage 4   Lisinopril Other (See Comments)    coughing   Peanut-Containing Drug Products Itching and Other (See Comments)    GI intolerance - diarrhea

## 2024-10-06 NOTE — Progress Notes (Signed)
 2D Echocardiogram completed

## 2024-10-06 NOTE — Consult Note (Signed)
 NEUROLOGY CONSULT NOTE   Date of service: October 06, 2024 Patient Name: [name] MRN:  [id number] DOB:  1955-11-02 Chief Complaint: Left hand numbness Requesting Provider: Shona Terry SAILOR, DO  History of Present Illness  [name] is a 69 y.o. 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  She denies any visual change, and states that she is legally blind at baseline.  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  LKW: Unclear Modified rankin score: 3-Moderate disability-requires help but walks WITHOUT assistance IV Thrombolysis: No, anticoagulated EVT: No, no clinical-diffusion mismatch and unclear time of onset  NIHSS components Score: Comment  1a Level of Conscious 0[]  1[]  2[]  3[]      1b LOC Questions 0[]  1[]  2[]       1c LOC Commands 0[]  1[]  2[]       2 Best Gaze 0[]  1[]  2[]       3 Visual 0[]  1[]  2[]  3[]      4 Facial Palsy 0[]  1[]  2[]  3[]      5a Motor Arm - left 0[]  1[]  2[]  3[]  4[]  UN[]    5b Motor Arm - Right 0[]  1[]  2[]  3[]  4[]  UN[]    6a Motor Leg - Left 0[]  1[]  2[]  3[]  4[]  UN[]    6b Motor Leg - Right 0[]  1[]  2[]  3[]  4[]  UN[]    7 Limb Ataxia 0[]  1[]  2[]  UN[]      8 Sensory 0[]  1[]  2[]  UN[]      9 Best Language 0[]  1[]  2[]  3[]      10 Dysarthria 0[]  1[]  2[]  UN[]      11 Extinct. and Inattention 0[]  1[]  2[]       TOTAL:        Past History   Past Medical History:  Diagnosis Date   Anemia    Arthritis    Atrial fibrillation (HCC)    CAD (coronary artery disease)    Chronic kidney disease, stage III (moderate) (HCC)    Constipation    Diabetes mellitus (HCC)    ESRD on dialysis (HCC) 04/20/2024   History of blood transfusion    HLD (hyperlipidemia)     Hypertension    Myocardial infarction [facility])    in April 2014   Septic shock (HCC) 04/19/2024   Wound infection after surgery 03/17/2013    Past Surgical History:  Procedure Laterality Date   AMPUTATION Right 04/20/2024   Procedure: AMPUTATION BELOW KNEE;  Surgeon: Harden Jerona GAILS, MD;  Location: [facility] OR;  Service: Orthopedics;  Laterality: Right;   ANKLE SURGERY Right    x 6 total   BREAST BIOPSY Left    15 or 20 yearsa ago she cant remember laterality    HARDWARE REMOVAL Right 02/13/2013   Procedure: HARDWARE REMOVAL;  Surgeon: Jerona GAILS Harden, MD;  Location: [facility] OR;  Service: Orthopedics;  Laterality: Right;  Right Ankle Removal Hardware, Debridement, Place Wound VAC   IR FLUORO GUIDE CV LINE LEFT  11/08/2023   IR FLUORO GUIDE CV LINE LEFT  04/23/2024   IR FLUORO GUIDE CV LINE RIGHT  11/01/2023   IR FLUORO GUIDE CV LINE RIGHT  11/12/2023   IR REMOVAL TUN CV CATH W/O FL  04/21/2024   IR US  GUIDE VASC ACCESS LEFT  11/08/2023  IR US  GUIDE VASC ACCESS LEFT  04/23/2024   IR US  GUIDE VASC ACCESS RIGHT  11/01/2023   IR US  GUIDE VASC ACCESS RIGHT  11/12/2023   laser surgery for diabetic retinopathy Bilateral    LEFT HEART CATHETERIZATION WITH CORONARY ANGIOGRAM N/A 01/09/2013   Procedure: LEFT HEART CATHETERIZATION WITH CORONARY ANGIOGRAM;  Surgeon: Toribio JONELLE Fuel, MD;  Location: [facility] CATH LAB;  Service: Cardiovascular;  Laterality: N/A;   ORIF ANKLE FRACTURE Right 01/12/2013   Procedure: OPEN REDUCTION INTERNAL FIXATION (ORIF) ANKLE FRACTURE;  Surgeon: Jerona LULLA Sage, MD;  Location: [facility] OR;  Service: Orthopedics;  Laterality: Right;   PARTIAL COLECTOMY N/A 11/02/2023   Procedure: OPEN PARTIAL COLECTOMY WITH END COLOSTOMY;  Surgeon: Dasie Leonor CROME, MD;  Location: [facility] OR;  Service: General;  Laterality: N/A;   PARTIAL HYSTERECTOMY     REVISION AMPUTATION, BELOW THE KNEE Right 06/24/2024   Procedure: REVISION AMPUTATION, BELOW THE KNEE;  Surgeon: Sage Jerona LULLA, MD;  Location: [facility] OR;  Service: Orthopedics;   Laterality: Right;   TUNNELLED CATHETER EXCHANGE N/A 04/15/2024   Procedure: TUNNELLED CATHETER EXCHANGE;  Surgeon: Pearline Norman RAMAN, MD;  Location: [facility] LAB;  Service: Cardiovascular;  Laterality: N/A;   TUNNELLED CATHETER EXCHANGE N/A 07/23/2024   Procedure: TUNNELLED CATHETER EXCHANGE;  Surgeon: Serene Gaile ORN, MD;  Location: [facility];  Service: Cardiovascular;  Laterality: N/A;    Family History: Family History  Problem Relation Age of Onset   Diabetes Mother        No history CAD   Colon cancer Mother 57       died at 59 from CRC   Hypertension Father        Also had CAD   Heart disease Father    Cervical cancer Sister    Diabetes Sister    Congestive Heart Failure Sister    Heart disease Maternal Grandmother    Diabetes Brother    Kidney disease Brother    Sickle cell anemia Son    Thyroid  disease Son    Breast cancer Neg Hx    BRCA 1/2 Neg Hx     Social History  reports that she has never smoked. She has never used smokeless tobacco. She reports that she does not drink alcohol  and does not use drugs.  Allergies[1]  Medications  Current Medications[2]  Vitals   Vitals:   10/05/24 2002 10/05/24 2004  BP:  115/87  Pulse:  80  Resp:  18  Temp:  98.2 F (36.8 C)  TempSrc:  Oral  SpO2: 100% 100%  Weight: 57.6 kg   Height: 5' 7 (1.702 m)     Body mass index is 19.89 kg/m.   Physical Exam   Constitutional: Appears well-developed and well-nourished.   Neurologic Examination    Neuro: Mental Status: Patient is awake, alert, oriented to person, place, month, year, and situation. Patient is able to give a clear and coherent history. No signs of aphasia  She has no extinction to double simultaneous stimulation Cranial Nerves: II: To confrontation she does appear to have a left hemianopia, but states that her vision appears no different to her than normal.  Pupils are reactive bilaterally III,IV, VI: She has a mildly disconjugate gaze which she  states is normal for V: Facial sensation is symmetric to temperature VII: Facial movement with possible mild left facial weakness VIII: hearing is intact to voice X: Uvula elevates symmetrically XII: tongue is midline without atrophy or fasciculations.  Motor: No drift  in any extremity Sensory: Sensation is symmetric, she does not extinguish to double simultaneous stimulation Cerebellar: She has more difficulty on finger-nose-finger on the left than right, but without past-pointing       Labs/Imaging/Neurodiagnostic studies   CBC:  Recent Labs  Lab 2024-10-10 2026  WBC 12.7*  NEUTROABS 10.3*  HGB 8.7*  HCT 28.3*  MCV 85.5  PLT 334   Basic Metabolic Panel:  Lab Results  Component Value Date   NA 140 October 10, 2024   K 3.6 10/10/2024   CO2 28 10-10-24   GLUCOSE 249 (H) 2024/10/10   BUN 22 10-10-24   CREATININE 4.13 (H) 2024/10/10   CALCIUM  8.6 (L) 10/10/2024   GFRNONAA 11 (L) October 10, 2024   GFRAA 21 (L) 12/21/2019   Lipid Panel:  Lab Results  Component Value Date   LDLCALC 69 12/21/2019   HgbA1c:  Lab Results  Component Value Date   HGBA1C 5.1 05/29/2024    INR  Lab Results  Component Value Date   INR 1.1 04/19/2024   APTT  Lab Results  Component Value Date   APTT 76 (H) 10/31/2023    MRI Brain(Personally reviewed): Right PCA territory infarct as well as involvement of the right parietal region which could be MCA-PCA watershed  ASSESSMENT   [name] is a 69 y.o. female with right PCA infarct.  With her not recommended and the fact that she had visual change, the time of onset of this is unclear.  It is possible that this happened when she had the numbness with dialysis, but it is also possible that she has had the visual change without recognizing it and the symptoms worsened in the setting of blood pressures getting low on dialysis.  Etiology could be artery to artery or cardioembolic, though she denies missing any doses of her Eliquis .  I  would favor holding Eliquis  for now given the petechial hemorrhage and the size of the infarcts  RECOMMENDATIONS  Hold Eliquis  for now CTA head and neck Aspirin  81 mg Telemetry, echo A1c, lipids PT, OT, ST ______________________________________________________________________    Signed, Aisha Seals, MD [facility]    [1]  Allergies Allergen Reactions   Nsaids Other (See Comments)    CKD stage 4   Lisinopril Other (See Comments)    coughing   Peanut-Containing Drug Products Itching and Other (See Comments)    GI intolerance - diarrhea  [2]  Current Facility-Administered Medications:    [START ON 10/07/2024]  stroke: early stages of recovery book, , Does not apply, Once, Shona Terry SAILOR, DO  Current Outpatient Medications:    ACCU-CHEK AVIVA PLUS test strip, CHECK BLOOD GLUCOSE THREE TIMES DAILY, Disp: 300 strip, Rfl: 3   acetaminophen  (TYLENOL ) 500 MG tablet, Take 2 tablets (1,000 mg total) by mouth 4 (four) times daily., Disp: , Rfl:    albuterol  (PROVENTIL ) (2.5 MG/3ML) 0.083% nebulizer solution, Take 3 mLs (2.5 mg total) by nebulization every 2 (two) hours as needed for wheezing., Disp: , Rfl:    apixaban  (ELIQUIS ) 5 MG TABS tablet, Take 1 tablet (5 mg total) by mouth 2 (two) times daily., Disp: , Rfl:    ascorbic acid  (VITAMIN C ) 500 MG tablet, Take 1 tablet (500 mg total) by mouth 2 (two) times daily., Disp: , Rfl:    Blood Glucose Monitoring Suppl (ACCU-CHEK AVIVA PLUS) w/Device KIT, CHECK BLOOD GLUCOSE THREE TIMES DAILY, Disp: 1 kit, Rfl: 0   brimonidine  (ALPHAGAN ) 0.2 % ophthalmic solution, Place 1 drop into both eyes 2 (two)  times daily., Disp: , Rfl:    calcitRIOL  (ROCALTROL ) 0.25 MCG capsule, TAKE 1 CAPSULE (0.25 MCG TOTAL) EVERY MONDAY, WEDNESDAY, AND FRIDAY (Patient taking differently: Take 0.25 mcg by mouth in the morning and at bedtime.), Disp: 39 capsule, Rfl: 0   carvedilol  (COREG ) 25 MG tablet, Take 25 mg by mouth 2 (two) times daily with a  meal., Disp: , Rfl:    collagenase  (SANTYL ) 250 UNIT/GM ointment, Apply topically 2 (two) times daily., Disp: , Rfl:    Darbepoetin Alfa  (ARANESP ) 200 MCG/0.4ML SOSY injection, Inject 0.4 mLs (200 mcg total) into the skin every Sunday at 6pm., Disp: , Rfl:    DROPLET INSULIN  SYRINGE 31G X 5/16 0.5 ML MISC, USE FOUR TIMES DAILY FOR INJECTIONS, Disp: 400 each, Rfl: 3   HYDROmorphone  (DILAUDID ) 2 MG tablet, Take 0.5 tablets (1 mg total) by mouth every 6 (six) hours as needed for severe pain (pain score 7-10) or moderate pain (pain score 4-6)., Disp: 30 tablet, Rfl: 0   [Paused] insulin  aspart (NOVOLOG  FLEXPEN) 100 UNIT/ML FlexPen, 0-9 Units, Subcutaneous, 3 times daily with meals CBG < 70: Implement Hypoglycemia measures CBG 70 - 120: 0 units CBG 121 - 150: 1 unit CBG 151 - 200: 2 units CBG 201 - 250: 3 units CBG 251 - 300: 5 units CBG 301 - 350: 7 units CBG 351 - 400: 9 units CBG > 400: call MD, Disp: , Rfl:    [Paused] insulin  glargine (LANTUS ) 100 UNIT/ML injection, Inject 0.07 mLs (7 Units total) into the skin daily. Please schedule appointment before next refill. (Patient taking differently: Inject 7-10 Units into the skin daily. Please schedule appointment before next refill.), Disp: , Rfl:    Insulin  Syringes, Disposable, U-100 0.5 ML MISC, 4x daily injections, Disp: 100 each, Rfl: 8   isosorbide  dinitrate (ISORDIL ) 20 MG tablet, Take 20 mg by mouth daily., Disp: , Rfl:    Lancet Devices (ACCU-CHEK SOFTCLIX) lancets, Fill for 1 month for TID testing. Use as instructed, Disp: 90 each, Rfl: 0   latanoprost  (XALATAN ) 0.005 % ophthalmic solution, Place 1 drop into both eyes at bedtime., Disp: , Rfl:    leptospermum manuka honey (MEDIHONEY) PSTE paste, Apply 1 Application topically 2 (two) times daily. Apply with wound changes IF santyl  not available., Disp: , Rfl:    melatonin 5 MG TABS, Take 10 mg by mouth at bedtime as needed (for insomnia)., Disp: , Rfl:    metoprolol  tartrate (LOPRESSOR ) 25 MG  tablet, Take 1 tablet (25 mg total) by mouth 2 (two) times daily., Disp: , Rfl:    midodrine  (PROAMATINE ) 2.5 MG tablet, Take 2.5 mg by mouth daily as needed (low bp)., Disp: , Rfl:    Multiple Vitamin (MULTIVITAMIN WITH MINERALS) TABS, Take 1 tablet by mouth daily., Disp: , Rfl:    NEEDLE, DISP, 30 G (BD DISP NEEDLES) 30G X 1/2 MISC, For 4x daily injections, Disp: 100 each, Rfl: 13   [Paused] NOVOLIN R 100 UNIT/ML injection, Inject 0-9 Units into the skin in the morning, at noon, and at bedtime. Per sliding scale, Disp: , Rfl:    nutrition supplement, JUVEN, (JUVEN) PACK, Take 1 packet by mouth 2 (two) times daily between meals., Disp: , Rfl:    oxyCODONE  (OXY IR/ROXICODONE ) 5 MG immediate release tablet, Take 1 tablet (5 mg total) by mouth every 6 (six) hours as needed for moderate pain (pain score 4-6)., Disp: 30 tablet, Rfl: 0   zinc  sulfate, 50mg  elemental zinc , 220 (50 Zn) MG capsule,  Take 1 capsule (220 mg total) by mouth daily., Disp: , Rfl:

## 2024-10-06 NOTE — Progress Notes (Addendum)
 SNF ref faxed; bed offers pending.  UPDATE 1:05pm: Bed offers reviewed and bed choice is [facility]. Pt insurance is not managed under [facility], facility notified and agreed to start auth. Ins auth pending approval.

## 2024-10-08 ENCOUNTER — Encounter (HOSPITAL_BASED_OUTPATIENT_CLINIC_OR_DEPARTMENT_OTHER): Admitting: General Surgery

## 2024-10-08 ENCOUNTER — Encounter (HOSPITAL_BASED_OUTPATIENT_CLINIC_OR_DEPARTMENT_OTHER): Attending: General Surgery | Admitting: General Surgery

## 2024-10-08 DIAGNOSIS — L89154 Pressure ulcer of sacral region, stage 4: Secondary | ICD-10-CM | POA: Diagnosis not present

## 2024-10-08 DIAGNOSIS — E11622 Type 2 diabetes mellitus with other skin ulcer: Secondary | ICD-10-CM | POA: Diagnosis present

## 2024-10-08 DIAGNOSIS — M8660 Other chronic osteomyelitis, unspecified site: Secondary | ICD-10-CM | POA: Diagnosis not present

## 2024-10-09 ENCOUNTER — Emergency Department (HOSPITAL_COMMUNITY)

## 2024-10-09 ENCOUNTER — Emergency Department (HOSPITAL_COMMUNITY)
Admission: EM | Admit: 2024-10-09 | Discharge: 2024-10-10 | Disposition: A | Discharge status: Home / Self Care | Attending: Emergency Medicine | Admitting: Emergency Medicine

## 2024-10-09 DIAGNOSIS — D72829 Elevated white blood cell count, unspecified: Secondary | ICD-10-CM | POA: Insufficient documentation

## 2024-10-09 DIAGNOSIS — Z9049 Acquired absence of other specified parts of digestive tract: Secondary | ICD-10-CM | POA: Insufficient documentation

## 2024-10-09 DIAGNOSIS — E1122 Type 2 diabetes mellitus with diabetic chronic kidney disease: Secondary | ICD-10-CM | POA: Diagnosis not present

## 2024-10-09 DIAGNOSIS — Z89511 Acquired absence of right leg below knee: Secondary | ICD-10-CM | POA: Diagnosis not present

## 2024-10-09 DIAGNOSIS — Z5329 Procedure and treatment not carried out because of patient's decision for other reasons: Secondary | ICD-10-CM | POA: Diagnosis not present

## 2024-10-09 DIAGNOSIS — N186 End stage renal disease: Secondary | ICD-10-CM | POA: Diagnosis not present

## 2024-10-09 DIAGNOSIS — I2510 Atherosclerotic heart disease of native coronary artery without angina pectoris: Secondary | ICD-10-CM | POA: Insufficient documentation

## 2024-10-09 DIAGNOSIS — D631 Anemia in chronic kidney disease: Secondary | ICD-10-CM | POA: Diagnosis not present

## 2024-10-09 DIAGNOSIS — Z79899 Other long term (current) drug therapy: Secondary | ICD-10-CM | POA: Insufficient documentation

## 2024-10-09 DIAGNOSIS — E211 Secondary hyperparathyroidism, not elsewhere classified: Secondary | ICD-10-CM | POA: Insufficient documentation

## 2024-10-09 DIAGNOSIS — Z8673 Personal history of transient ischemic attack (TIA), and cerebral infarction without residual deficits: Secondary | ICD-10-CM | POA: Insufficient documentation

## 2024-10-09 DIAGNOSIS — Z91010 Allergy to peanuts: Secondary | ICD-10-CM | POA: Insufficient documentation

## 2024-10-09 DIAGNOSIS — Z7901 Long term (current) use of anticoagulants: Secondary | ICD-10-CM | POA: Insufficient documentation

## 2024-10-09 DIAGNOSIS — R531 Weakness: Secondary | ICD-10-CM | POA: Diagnosis not present

## 2024-10-09 DIAGNOSIS — R200 Anesthesia of skin: Secondary | ICD-10-CM | POA: Diagnosis present

## 2024-10-09 DIAGNOSIS — Z794 Long term (current) use of insulin: Secondary | ICD-10-CM | POA: Diagnosis not present

## 2024-10-09 DIAGNOSIS — L89154 Pressure ulcer of sacral region, stage 4: Secondary | ICD-10-CM | POA: Diagnosis not present

## 2024-10-09 DIAGNOSIS — Z933 Colostomy status: Secondary | ICD-10-CM | POA: Diagnosis not present

## 2024-10-09 DIAGNOSIS — I480 Paroxysmal atrial fibrillation: Secondary | ICD-10-CM | POA: Diagnosis not present

## 2024-10-09 DIAGNOSIS — I120 Hypertensive chronic kidney disease with stage 5 chronic kidney disease or end stage renal disease: Secondary | ICD-10-CM | POA: Diagnosis not present

## 2024-10-09 DIAGNOSIS — I959 Hypotension, unspecified: Secondary | ICD-10-CM | POA: Insufficient documentation

## 2024-10-09 DIAGNOSIS — I4891 Unspecified atrial fibrillation: Secondary | ICD-10-CM | POA: Insufficient documentation

## 2024-10-09 DIAGNOSIS — Z992 Dependence on renal dialysis: Secondary | ICD-10-CM | POA: Insufficient documentation

## 2024-10-09 LAB — RESP PANEL BY RT-PCR (RSV, FLU A&B, COVID)  RVPGX2
Influenza A by PCR: NEGATIVE
Influenza B by PCR: NEGATIVE
Resp Syncytial Virus by PCR: NEGATIVE
SARS Coronavirus 2 by RT PCR: NEGATIVE

## 2024-10-09 LAB — CBC
HCT: 25.8 % — ABNORMAL LOW (ref 36.0–46.0)
Hemoglobin: 8 g/dL — ABNORMAL LOW (ref 12.0–15.0)
MCH: 26.5 pg (ref 26.0–34.0)
MCHC: 31 g/dL (ref 30.0–36.0)
MCV: 85.4 fL (ref 80.0–100.0)
Platelets: 421 K/uL — ABNORMAL HIGH (ref 150–400)
RBC: 3.02 MIL/uL — ABNORMAL LOW (ref 3.87–5.11)
RDW: 19.9 % — ABNORMAL HIGH (ref 11.5–15.5)
WBC: 17 K/uL — ABNORMAL HIGH (ref 4.0–10.5)
nRBC: 0 % (ref 0.0–0.2)

## 2024-10-09 LAB — BASIC METABOLIC PANEL WITH GFR
Anion gap: 11 (ref 5–15)
BUN: 19 mg/dL (ref 8–23)
CO2: 26 mmol/L (ref 22–32)
Calcium: 7.7 mg/dL — ABNORMAL LOW (ref 8.9–10.3)
Chloride: 98 mmol/L (ref 98–111)
Creatinine, Ser: 3.92 mg/dL — ABNORMAL HIGH (ref 0.44–1.00)
GFR, Estimated: 12 mL/min — ABNORMAL LOW
Glucose, Bld: 197 mg/dL — ABNORMAL HIGH (ref 70–99)
Potassium: 3.5 mmol/L (ref 3.5–5.1)
Sodium: 135 mmol/L (ref 135–145)

## 2024-10-09 MED ORDER — ACETAMINOPHEN 500 MG PO TABS
1000.0000 mg | ORAL_TABLET | Freq: Once | ORAL | Status: AC
  Administered 2024-10-09: 1000 mg via ORAL
  Filled 2024-10-09: qty 2

## 2024-10-09 MED ORDER — MIDODRINE HCL 5 MG PO TABS
5.0000 mg | ORAL_TABLET | Freq: Once | ORAL | Status: AC
  Administered 2024-10-09: 5 mg via ORAL
  Filled 2024-10-09: qty 1

## 2024-10-09 MED ORDER — SODIUM CHLORIDE 0.9 % IV BOLUS
500.0000 mL | Freq: Once | INTRAVENOUS | Status: AC
  Administered 2024-10-09: 500 mL via INTRAVENOUS

## 2024-10-09 NOTE — Discharge Instructions (Addendum)
 It was a pleasure caring for you today in the emergency department.  Please watch your blood pressure closely.  Take your midodrine  as needed per your prescription for low blood pressure.  Follow-up with general surgery regarding evaluation for reversal of ostomy  Please return to the emergency department for any worsening or worrisome symptoms.

## 2024-10-09 NOTE — ED Notes (Signed)
 Patient transported to CT

## 2024-10-09 NOTE — ED Provider Notes (Signed)
 " [place] [facility] Provider Note  CSN: [id number] Arrival date & time: 10/09/24 1847  Chief Complaint(s) No chief complaint on file.  HPI [name] is a 69 y.o. female with past medical history as below, significant for A-fib, CAD, CKD, ESRD on HD MWF, recent CVA who presents to the ED with complaint of hypotension  Patient was admitted 1/13 following acute CVA.  She was having left hand numbness, dizziness, headache.  She had MRI done that was revealing for right parietal and PCA territory infarct.  She has stroke workup and was discharged on 1/13.  She presents to the ER today with low blood pressure following dialysis.  Reports she had full session today, reports she very tired after dialysis.  Family called EMS, she was very weak, social difficulty speaking.  Blood pressure was systolic in the 80s.  She was given fluid by EMS and her symptoms precipitously improved.  Patient denies any weakness or numbness to her extremities.  No change in symptoms from recent admission.  Feels that she is speaking normally.  Her only concern today is that she would like to speak to a surgeon about getting her ostomy reversed.  Past Medical History Past Medical History:  Diagnosis Date   Anemia    Arthritis    Atrial fibrillation (HCC)    CAD (coronary artery disease)    Chronic kidney disease, stage III (moderate) (HCC)    Constipation    Diabetes mellitus (HCC)    ESRD on dialysis (HCC) 04/20/2024   History of blood transfusion    HLD (hyperlipidemia)    Hypertension    Myocardial infarction [facility])    in April 2014   Septic shock (HCC) 04/19/2024   Wound infection after surgery 03/17/2013   Patient Active Problem List   Diagnosis Date Noted   CVA (cerebral vascular accident) (HCC) 10/06/2024   Non-healing wound of amputation stump (HCC) 06/24/2024   Wound dehiscence 06/04/2024   Severe protein-calorie malnutrition 06/03/2024   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) 05/30/2024   Hypoalbuminemia due to protein-calorie malnutrition 05/30/2024   Osteomyelitis of vertebra, sacral and sacrococcygeal region (HCC) 05/29/2024   Primary hypercoagulable state 05/29/2024   BKA stump complication (HCC) 05/29/2024   Normocytic anemia 05/29/2024   Does not feel safe at home 05/29/2024   Colostomy in place [facility]) 05/29/2024   Diabetes mellitus with kidney complication, with long-term current use of insulin  (HCC) 05/29/2024   Osteomyelitis (HCC) 04/20/2024   MRSA bacteremia 04/20/2024   Bacteremia 04/20/2024   Necrotizing fasciitis (HCC) 04/20/2024   ESRD (end stage renal disease) (HCC) 04/20/2024   Irritant contact dermatitis associated with fecal stoma 12/27/2023   Colostomy complication, unspecified (HCC) 12/27/2023   Diverticulitis of large intestine with complication 10/27/2023   Foot callus 02/27/2021   Counseled about COVID-19 virus infection 02/24/2020   Aortic heart murmur 05/15/2019   Thyroid  enlargement 07/19/2014   Vitamin D  deficiency 09/03/2013   Hyperlipidemia 04/24/2013   Diabetic retinopathy (HCC) 01/23/2013   Chronic atrial fibrillation (HCC) 01/15/2013   Aortic atherosclerosis 01/11/2013   H/O non-ST elevation myocardial infarction (NSTEMI) 01/09/2013   History of stroke 01/08/2013   Diabetic neuropathy 01/08/2013   Hypertension    Diabetes mellitus    Home Medication(s) Prior to Admission medications  Medication Sig Start Date End Date Taking? Authorizing Provider  ACCU-CHEK AVIVA PLUS test strip CHECK BLOOD GLUCOSE THREE TIMES DAILY 11/05/22   Joshua Domino, DO  acetaminophen  (TYLENOL )  500 MG tablet Take 2 tablets (1,000 mg total) by mouth 4 (four) times daily. 06/09/24   Myrna Bitters, DO  albuterol  (PROVENTIL ) (2.5 MG/3ML) 0.083% nebulizer solution Take 3 mLs (2.5 mg total) by nebulization every 2 (two) hours as needed for wheezing. 11/25/23   Ghimire, Donalda HERO, MD  apixaban  (ELIQUIS ) 5 MG TABS tablet Take 1  tablet (5 mg total) by mouth 2 (two) times daily. 06/09/24   King, Olivia, DO  ascorbic acid  (VITAMIN C ) 500 MG tablet Take 1 tablet (500 mg total) by mouth 2 (two) times daily. 06/09/24   King, Olivia, DO  atorvastatin  (LIPITOR ) 40 MG tablet Take 1 tablet (40 mg total) by mouth daily. 10/06/24   Lue Elsie BROCKS, MD  Blood Glucose Monitoring Suppl (ACCU-CHEK AVIVA PLUS) w/Device KIT CHECK BLOOD GLUCOSE THREE TIMES DAILY 10/26/20   Autry-Lott, Rojean, DO  brimonidine  (ALPHAGAN ) 0.2 % ophthalmic solution Place 1 drop into both eyes 2 (two) times daily.    [provider]  calcitRIOL  (ROCALTROL ) 0.25 MCG capsule TAKE 1 CAPSULE (0.25 MCG TOTAL) EVERY MONDAY, WEDNESDAY, AND FRIDAY Patient taking differently: Take 0.25 mcg by mouth in the morning and at bedtime. 06/22/22   Christia Budds, MD  collagenase  (SANTYL ) 250 UNIT/GM ointment Apply topically 2 (two) times daily. 06/09/24   King, Olivia, DO  Darbepoetin Alfa  (ARANESP ) 200 MCG/0.4ML SOSY injection Inject 0.4 mLs (200 mcg total) into the skin every Sunday at 6pm. 06/14/24   Myrna Bitters, DO  DROPLET INSULIN  SYRINGE 31G X 5/16 0.5 ML MISC USE FOUR TIMES DAILY FOR INJECTIONS 09/03/22   Joshua Domino, DO  HYDROmorphone  (DILAUDID ) 2 MG tablet Take 0.5 tablets (1 mg total) by mouth every 6 (six) hours as needed for severe pain (pain score 7-10) or moderate pain (pain score 4-6). 06/09/24   King, Olivia, DO  insulin  aspart (NOVOLOG  FLEXPEN) 100 UNIT/ML FlexPen 0-9 Units, Subcutaneous, 3 times daily with meals CBG < 70: Implement Hypoglycemia measures CBG 70 - 120: 0 units CBG 121 - 150: 1 unit CBG 151 - 200: 2 units CBG 201 - 250: 3 units CBG 251 - 300: 5 units CBG 301 - 350: 7 units CBG 351 - 400: 9 units CBG > 400: call MD 11/25/23   Raenelle Donalda HERO, MD  Insulin  Syringes, Disposable, U-100 0.5 ML MISC 4x daily injections 03/21/18   Riccio, Angela C, DO  Lancet Devices (ACCU-CHEK SOFTCLIX) lancets Fill for 1 month for TID testing. Use as instructed  11/13/13   Curtis Hadassah DASEN, MD  latanoprost  (XALATAN ) 0.005 % ophthalmic solution Place 1 drop into both eyes at bedtime.    [provider]  leptospermum manuka honey (MEDIHONEY) PSTE paste Apply 1 Application topically 2 (two) times daily. Apply with wound changes IF santyl  not available. 06/09/24   King, Olivia, DO  melatonin 5 MG TABS Take 10 mg by mouth at bedtime as needed (for insomnia).    [provider]  midodrine  (PROAMATINE ) 2.5 MG tablet Take 2.5 mg by mouth daily as needed (low bp).    [provider]  Multiple Vitamin (MULTIVITAMIN WITH MINERALS) TABS Take 1 tablet by mouth daily.    [provider]  NEEDLE, DISP, 30 G (BD DISP NEEDLES) 30G X 1/2 MISC For 4x daily injections 11/05/15   Phelps, Jazma Y, DO  NOVOLIN R 100 UNIT/ML injection Inject 0-9 Units into the skin in the morning, at noon, and at bedtime. Per sliding scale 04/03/24   [provider]  nutrition supplement, JUVEN, (  JUVEN) PACK Take 1 packet by mouth 2 (two) times daily between meals. 06/09/24   King, Olivia, DO  oxyCODONE  (OXY IR/ROXICODONE ) 5 MG immediate release tablet Take 1 tablet (5 mg total) by mouth every 6 (six) hours as needed for moderate pain (pain score 4-6). 06/30/24   Harden Jerona GAILS, MD  zinc  sulfate, 50mg  elemental zinc , 220 (50 Zn) MG capsule Take 1 capsule (220 mg total) by mouth daily. 06/10/24   Myrna Bitters, DO                                                                                                                                    Past Surgical History Past Surgical History:  Procedure Laterality Date   AMPUTATION Right 04/20/2024   Procedure: AMPUTATION BELOW KNEE;  Surgeon: Harden Jerona GAILS, MD;  Location: [facility] OR;  Service: Orthopedics;  Laterality: Right;   ANKLE SURGERY Right    x 6 total   BREAST BIOPSY Left    15 or 20 yearsa ago she cant remember laterality    HARDWARE REMOVAL Right 02/13/2013   Procedure: HARDWARE REMOVAL;  Surgeon:  Jerona GAILS Harden, MD;  Location: [facility] OR;  Service: Orthopedics;  Laterality: Right;  Right Ankle Removal Hardware, Debridement, Place Wound VAC   IR FLUORO GUIDE CV LINE LEFT  11/08/2023   IR FLUORO GUIDE CV LINE LEFT  04/23/2024   IR FLUORO GUIDE CV LINE RIGHT  11/01/2023   IR FLUORO GUIDE CV LINE RIGHT  11/12/2023   IR REMOVAL TUN CV CATH W/O FL  04/21/2024   IR US  GUIDE VASC ACCESS LEFT  11/08/2023   IR US  GUIDE VASC ACCESS LEFT  04/23/2024   IR US  GUIDE VASC ACCESS RIGHT  11/01/2023   IR US  GUIDE VASC ACCESS RIGHT  11/12/2023   laser surgery for diabetic retinopathy Bilateral    LEFT HEART CATHETERIZATION WITH CORONARY ANGIOGRAM N/A 01/09/2013   Procedure: LEFT HEART CATHETERIZATION WITH CORONARY ANGIOGRAM;  Surgeon: Toribio JONELLE Fuel, MD;  Location: [facility] CATH LAB;  Service: Cardiovascular;  Laterality: N/A;   ORIF ANKLE FRACTURE Right 01/12/2013   Procedure: OPEN REDUCTION INTERNAL FIXATION (ORIF) ANKLE FRACTURE;  Surgeon: Jerona GAILS Harden, MD;  Location: [facility] OR;  Service: Orthopedics;  Laterality: Right;   PARTIAL COLECTOMY N/A 11/02/2023   Procedure: OPEN PARTIAL COLECTOMY WITH END COLOSTOMY;  Surgeon: Dasie Leonor CROME, MD;  Location: [facility] OR;  Service: General;  Laterality: N/A;   PARTIAL HYSTERECTOMY     REVISION AMPUTATION, BELOW THE KNEE Right 06/24/2024   Procedure: REVISION AMPUTATION, BELOW THE KNEE;  Surgeon: Harden Jerona GAILS, MD;  Location: [facility] OR;  Service: Orthopedics;  Laterality: Right;   TUNNELLED CATHETER EXCHANGE N/A 04/15/2024   Procedure: TUNNELLED CATHETER EXCHANGE;  Surgeon: Pearline Norman RAMAN, MD;  Location: [facility] LAB;  Service: Cardiovascular;  Laterality: N/A;   TUNNELLED CATHETER EXCHANGE N/A 07/23/2024   Procedure: TUNNELLED CATHETER EXCHANGE;  Surgeon: Serene,  Gaile ORN, MD;  Location: [facility];  Service: Cardiovascular;  Laterality: N/A;   Family History Family History  Problem Relation Age of Onset   Diabetes Mother        No history CAD   Colon cancer Mother 25       died at 51  from CRC   Hypertension Father        Also had CAD   Heart disease Father    Cervical cancer Sister    Diabetes Sister    Congestive Heart Failure Sister    Heart disease Maternal Grandmother    Diabetes Brother    Kidney disease Brother    Sickle cell anemia Son    Thyroid  disease Son    Breast cancer Neg Hx    BRCA 1/2 Neg Hx     Social History Social History[1] Allergies Nsaids, Lisinopril, and Peanut-containing drug products  Review of Systems A thorough review of systems was obtained and all systems are negative except as noted in the HPI and PMH.   Physical Exam Vital Signs  I have reviewed the triage vital signs BP (!) 108/58   Pulse 68   Temp 97.9 F (36.6 C) (Oral)   Resp 20   SpO2 96%  Physical Exam Vitals and nursing note reviewed.  Constitutional:      General: She is not in acute distress.    Appearance: Normal appearance.  HENT:     Head: Normocephalic and atraumatic.     Right Ear: External ear normal.     Left Ear: External ear normal.     Nose: Nose normal.     Mouth/Throat:     Mouth: Mucous membranes are moist.  Eyes:     General: No scleral icterus.       Right eye: No discharge.        Left eye: No discharge.     Extraocular Movements: Extraocular movements intact.     Pupils: Pupils are equal, round, and reactive to light.  Cardiovascular:     Rate and Rhythm: Normal rate and regular rhythm.     Pulses: Normal pulses.     Heart sounds: Normal heart sounds.  Pulmonary:     Effort: Pulmonary effort is normal. No respiratory distress.     Breath sounds: Normal breath sounds. No stridor.  Abdominal:     General: Abdomen is flat. There is no distension.     Palpations: Abdomen is soft.     Tenderness: There is no abdominal tenderness.   Musculoskeletal:     Cervical back: No rigidity.     Right lower leg: No edema.     Left lower leg: No edema.     Comments: Right leg amputation  Skin:    General: Skin is warm and dry.      Capillary Refill: Capillary refill takes less than 2 seconds.  Neurological:     Mental Status: She is alert and oriented to person, place, and time.     GCS: GCS eye subscore is 4. GCS verbal subscore is 5. GCS motor subscore is 6.     Cranial Nerves: Cranial nerves 2-12 are intact. No facial asymmetry.     Sensory: Sensation is intact.     Motor: Motor function is intact. Tremors: .sgst.     Coordination: Coordination is intact.     Comments: Gait testing deferred secondary to patient safety. Strength 5/5 to BLUE/LLE, equal and symmetric Sensation symmetric to extremities  Psychiatric:        Mood and Affect: Mood normal.        Behavior: Behavior normal. Behavior is cooperative.     ED Results and Treatments Labs (all labs ordered are listed, but only abnormal results are displayed) Labs Reviewed  CBC - Abnormal; Notable for the following components:      Result Value   WBC 17.0 (*)    RBC 3.02 (*)    Hemoglobin 8.0 (*)    HCT 25.8 (*)    RDW 19.9 (*)    Platelets 421 (*)    All other components within normal limits  BASIC METABOLIC PANEL WITH GFR - Abnormal; Notable for the following components:   Glucose, Bld 197 (*)    Creatinine, Ser 3.92 (*)    Calcium  7.7 (*)    GFR, Estimated 12 (*)    All other components within normal limits  RESP PANEL BY RT-PCR (RSV, FLU A&B, COVID)  [id number]                                                                                                                          Radiology CT ABDOMEN PELVIS WO CONTRAST Result Date: 10/09/2024 EXAM: CT ABDOMEN AND PELVIS WITHOUT CONTRAST 10/09/2024 10:21:30 PM TECHNIQUE: CT of the abdomen and pelvis was performed without the administration of intravenous contrast. Multiplanar reformatted images are provided for review. Automated exposure control, iterative reconstruction, and/or weight-based adjustment of the mA/kV was utilized to reduce the radiation dose to as low as reasonably achievable.  COMPARISON: 05/29/2024 CLINICAL HISTORY: Abdominal pain, acute, nonlocalized. FINDINGS: LOWER CHEST: No acute abnormality. LIVER: The liver is unremarkable. GALLBLADDER AND BILE DUCTS: Layering high-density material within the gallbladder could be layering contrast material from prior contrast enhanced CT. There were small stones layering in the gallbladder on previous study, obscured by the diffuse high-density material is now present within the gallbladder. No biliary ductal dilatation. SPLEEN: No acute abnormality. PANCREAS: No acute abnormality. ADRENAL GLANDS: No acute abnormality. KIDNEYS, URETERS AND BLADDER: High density material also remains present in the renal collecting systems bilaterally, which could obscure small stones. No hydronephrosis. No perinephric or periureteral stranding. The urinary bladder is filled with contrast, grossly unremarkable. GI AND BOWEL: Stomach demonstrates no acute abnormality. Left lower quadrant ostomy noted, unchanged. No bowel obstruction. PERITONEUM AND RETROPERITONEUM: No free fluid. No free air. VASCULATURE: Aorta is normal in caliber. Diffuse aortic atherosclerosis. LYMPH NODES: No lymphadenopathy. REPRODUCTIVE ORGANS: Prior hysterectomy. No adnexal masses. BONES AND SOFT TISSUES: No acute osseous abnormality. No focal soft tissue abnormality. IMPRESSION: 1. No acute findings in the abdomen or pelvis. 2. Left lower quadrant ostomy, unchanged, with no bowel obstruction. 3. Aortic atherosclerosis. Electronically signed by: Franky Crease MD 10/09/2024 10:31 PM EST RP Workstation: [id number]   DG Chest Portable 1 View Result Date: 10/09/2024 EXAM: 1 VIEW(S) XRAY OF THE CHEST 10/09/2024 07:28:00 PM COMPARISON: 04/12/2024 CLINICAL HISTORY: hypotension hypotension hypotension FINDINGS: LINES, TUBES AND DEVICES: Left dialysis catheter  in place with the tip in the SVC. LUNGS AND PLEURA: No focal pulmonary opacity. No pleural effusion. No pneumothorax. HEART AND MEDIASTINUM:  Aortic atherosclerosis. No acute abnormality of the cardiac and mediastinal silhouettes. BONES AND SOFT TISSUES: No acute osseous abnormality. IMPRESSION: 1. No acute process. Electronically signed by: Franky Crease MD 10/09/2024 07:35 PM EST RP Workstation: [id number]    Pertinent labs & imaging results that were available during my care of the patient were reviewed by me and considered in my medical decision making (see MDM for details).  Medications Ordered in ED Medications  acetaminophen  (TYLENOL ) tablet 1,000 mg (1,000 mg Oral Given 10/09/24 2017)  sodium chloride  0.9 % bolus 500 mL (0 mLs Intravenous Stopped 10/09/24 2116)  midodrine  (PROAMATINE ) tablet 5 mg (5 mg Oral Given 10/09/24 2027)                                                                                                                                     Procedures Procedures  (including critical care time)  Medical Decision Making / ED Course    Medical Decision Making:    FRITZI SCRIPTER is a 69 y.o. female with past medical history as below, significant for A-fib, CAD, CKD, ESRD on HD MWF, recent CVA who presents to the ED with complaint of hypotension. The complaint involves an extensive differential diagnosis and also carries with it a high risk of complications and morbidity.  Serious etiology was considered. Ddx includes but is not limited to: Overdiuresis, dehydration, infection, electrolyte derangement, etc.  Complete initial physical exam performed, notably the patient was in no acute distress, neuro intact.  Blood pressure improved.    Reviewed and confirmed nursing documentation for past medical history, family history, social history.  Vital signs reviewed.    Hypotension> - Likely secondary to overdiuresis.  She is quite fluid responsive and blood pressure normalized with fluids from EMS - She is prescribed as needed midodrine , did not take today - Reports that she is feeling back to normal. - BP  similar to her typical range -.  Encouraged to take midodrine  as prescribed  Leukocytosis> - WBC elevated from her prior.  She has no fever, denies any sort of infectious symptoms - X-ray unremarkable, CT abdomen pelvis unremarkable - Possible stress response from hypotension - Does not appear to be septic   Concern for speech change> - EMS reports family is concerned that she might have some abnormal speech - Patient feels that she is speaking normally, no aphasia or dysarthria - She had stroke workup less than a week ago - She denies any sensation changes or strength changes to her extremities.  No vision changes.  Feels as though she is speaking normally.  Clinical Course as of 10/09/24 2321  Fri Oct 09, 2024  2010 Creatinine(!): 3.92 Similar prior, ESRD [SG]  2010 Hemoglobin(!): 8.0 Mildly reduced from prior [SG]  2057 BP(!): 90/40 BP  reduced again, will give 500ml bolus and midodrine  [SG]  2255 Feeling much better  Neuro intact  Tolerating po [SG]    Clinical Course User Index [SG] Elnor Jayson LABOR, DO     11:21 PM:  I have discussed the diagnosis/risks/treatment options with the patient.  Evaluation and diagnostic testing in the emergency department does not suggest an emergent condition requiring admission or immediate intervention beyond what has been performed at this time.  They will follow up with pcp. We also discussed returning to the ED immediately if new or worsening sx occur. We discussed the sx which are most concerning (e.g., sudden worsening pain, fever, inability to tolerate by mouth) that necessitate immediate return.    The patient appears reasonably screened and/or stabilized for discharge and I doubt any other medical condition or other [facility] requiring further screening, evaluation, or treatment in the ED at this time prior to discharge.                 Additional history obtained: -Additional history obtained from ems -External records from  outside source obtained and reviewed including: Chart review including previous notes, labs, imaging, consultation notes including  Recent admit Home meds   Lab Tests: -I ordered, reviewed, and interpreted labs.   The pertinent results include:   Labs Reviewed  CBC - Abnormal; Notable for the following components:      Result Value   WBC 17.0 (*)    RBC 3.02 (*)    Hemoglobin 8.0 (*)    HCT 25.8 (*)    RDW 19.9 (*)    Platelets 421 (*)    All other components within normal limits  BASIC METABOLIC PANEL WITH GFR - Abnormal; Notable for the following components:   Glucose, Bld 197 (*)    Creatinine, Ser 3.92 (*)    Calcium  7.7 (*)    GFR, Estimated 12 (*)    All other components within normal limits  RESP PANEL BY RT-PCR (RSV, FLU A&B, COVID)  [id number]    Notable for wbc elev  EKG   EKG Interpretation Date/Time:  Friday October 09 2024 18:59:34 EST Ventricular Rate:  76 PR Interval:  148 QRS Duration:  88 QT Interval:  417 QTC Calculation: 469 R Axis:   -9  Text Interpretation: Sinus rhythm Interpretation limited secondary to artifact Borderline low voltage, extremity leads Confirmed by Elnor Jayson ([id number]) on 10/09/2024 7:20:57 PM         Imaging Studies ordered: I ordered imaging studies including cxr, ctap I independently visualized the following imaging with scope of interpretation limited to determining acute life threatening conditions related to emergency care; findings noted above I agree with the radiologist interpretation If any imaging was obtained with contrast I closely monitored patient for any possible adverse reaction a/w contrast administration in the emergency department   Medicines ordered and prescription drug management: Meds ordered this encounter  Medications   acetaminophen  (TYLENOL ) tablet 1,000 mg   sodium chloride  0.9 % bolus 500 mL   midodrine  (PROAMATINE ) tablet 5 mg    -I have reviewed the patients home medicines and have made  adjustments as needed   Consultations Obtained: na   Cardiac Monitoring: The patient was maintained on a cardiac monitor.  I personally viewed and interpreted the cardiac monitored which showed an underlying rhythm of: sr Continuous pulse oximetry interpreted by myself, 98% on RA.    Social Determinants of Health:  Diagnosis or treatment significantly limited by social determinants of health: na  Reevaluation: After the interventions noted above, I reevaluated the patient and found that they have improved  Co morbidities that complicate the patient evaluation  Past Medical History:  Diagnosis Date   Anemia    Arthritis    Atrial fibrillation (HCC)    CAD (coronary artery disease)    Chronic kidney disease, stage III (moderate) (HCC)    Constipation    Diabetes mellitus (HCC)    ESRD on dialysis (HCC) 04/20/2024   History of blood transfusion    HLD (hyperlipidemia)    Hypertension    Myocardial infarction [facility])    in April 2014   Septic shock (HCC) 04/19/2024   Wound infection after surgery 03/17/2013      Dispostion: Disposition decision including need for hospitalization was considered, and patient discharged from emergency department.    Final Clinical Impression(s) / ED Diagnoses Final diagnoses:  Hypotension, unspecified hypotension type  ESRD on hemodialysis (HCC)         [1]  Social History Tobacco Use   Smoking status: Never   Smokeless tobacco: Never  Vaping Use   Vaping status: Never Used  Substance Use Topics   Alcohol  use: No   Drug use: No     Elnor Jayson LABOR, DO 10/09/24 2321  "

## 2024-10-09 NOTE — ED Notes (Addendum)
 Pt BP low, MD contacted and new orders placed

## 2024-10-09 NOTE — ED Triage Notes (Addendum)
 PT BIB [facility] from home.  LKW 1600. Found weak, lethargic, slurred speech by son at 3. BP was 80's sBP. Symptoms improved with fluid resuscitation. PT is A&O. Dialysis today.  Wound vac to tailbone. Stroke on Monday with speech and LH deficits. Those seem to have resolved.   107 SBP, 98/40 HR 70's (afib), 95% RA, CBG 267

## 2024-10-10 ENCOUNTER — Other Ambulatory Visit: Payer: Self-pay

## 2024-10-10 ENCOUNTER — Encounter (HOSPITAL_COMMUNITY): Payer: Self-pay

## 2024-10-10 ENCOUNTER — Emergency Department (HOSPITAL_COMMUNITY)
Admission: EM | Admit: 2024-10-10 | Discharge: 2024-10-12 | Attending: Emergency Medicine | Admitting: Emergency Medicine

## 2024-10-10 DIAGNOSIS — L89154 Pressure ulcer of sacral region, stage 4: Secondary | ICD-10-CM | POA: Insufficient documentation

## 2024-10-10 DIAGNOSIS — R200 Anesthesia of skin: Secondary | ICD-10-CM | POA: Insufficient documentation

## 2024-10-10 DIAGNOSIS — N186 End stage renal disease: Secondary | ICD-10-CM | POA: Insufficient documentation

## 2024-10-10 DIAGNOSIS — Z7901 Long term (current) use of anticoagulants: Secondary | ICD-10-CM | POA: Insufficient documentation

## 2024-10-10 DIAGNOSIS — Z5329 Procedure and treatment not carried out because of patient's decision for other reasons: Secondary | ICD-10-CM | POA: Insufficient documentation

## 2024-10-10 DIAGNOSIS — Z8673 Personal history of transient ischemic attack (TIA), and cerebral infarction without residual deficits: Secondary | ICD-10-CM | POA: Insufficient documentation

## 2024-10-10 DIAGNOSIS — Z794 Long term (current) use of insulin: Secondary | ICD-10-CM | POA: Insufficient documentation

## 2024-10-10 DIAGNOSIS — E11628 Type 2 diabetes mellitus with other skin complications: Secondary | ICD-10-CM | POA: Insufficient documentation

## 2024-10-10 DIAGNOSIS — Z992 Dependence on renal dialysis: Secondary | ICD-10-CM | POA: Insufficient documentation

## 2024-10-10 DIAGNOSIS — Z91010 Allergy to peanuts: Secondary | ICD-10-CM | POA: Insufficient documentation

## 2024-10-10 DIAGNOSIS — E1122 Type 2 diabetes mellitus with diabetic chronic kidney disease: Secondary | ICD-10-CM | POA: Insufficient documentation

## 2024-10-10 DIAGNOSIS — I639 Cerebral infarction, unspecified: Secondary | ICD-10-CM

## 2024-10-10 DIAGNOSIS — R531 Weakness: Secondary | ICD-10-CM | POA: Insufficient documentation

## 2024-10-10 DIAGNOSIS — I480 Paroxysmal atrial fibrillation: Secondary | ICD-10-CM | POA: Insufficient documentation

## 2024-10-10 LAB — CBC WITH DIFFERENTIAL/PLATELET
Abs Immature Granulocytes: 0.13 K/uL — ABNORMAL HIGH (ref 0.00–0.07)
Basophils Absolute: 0.1 K/uL (ref 0.0–0.1)
Basophils Relative: 1 %
Eosinophils Absolute: 0.1 K/uL (ref 0.0–0.5)
Eosinophils Relative: 1 %
HCT: 25.5 % — ABNORMAL LOW (ref 36.0–46.0)
Hemoglobin: 7.8 g/dL — ABNORMAL LOW (ref 12.0–15.0)
Immature Granulocytes: 1 %
Lymphocytes Relative: 8 %
Lymphs Abs: 1.3 K/uL (ref 0.7–4.0)
MCH: 26.4 pg (ref 26.0–34.0)
MCHC: 30.6 g/dL (ref 30.0–36.0)
MCV: 86.4 fL (ref 80.0–100.0)
Monocytes Absolute: 1.6 K/uL — ABNORMAL HIGH (ref 0.1–1.0)
Monocytes Relative: 10 %
Neutro Abs: 12.5 K/uL — ABNORMAL HIGH (ref 1.7–7.7)
Neutrophils Relative %: 79 %
Platelets: 438 K/uL — ABNORMAL HIGH (ref 150–400)
RBC: 2.95 MIL/uL — ABNORMAL LOW (ref 3.87–5.11)
RDW: 19.6 % — ABNORMAL HIGH (ref 11.5–15.5)
WBC: 15.7 K/uL — ABNORMAL HIGH (ref 4.0–10.5)
nRBC: 0 % (ref 0.0–0.2)

## 2024-10-10 LAB — COMPREHENSIVE METABOLIC PANEL WITH GFR
ALT: 9 U/L (ref 0–44)
AST: 14 U/L — ABNORMAL LOW (ref 15–41)
Albumin: 2.7 g/dL — ABNORMAL LOW (ref 3.5–5.0)
Alkaline Phosphatase: 73 U/L (ref 38–126)
Anion gap: 13 (ref 5–15)
BUN: 28 mg/dL — ABNORMAL HIGH (ref 8–23)
CO2: 25 mmol/L (ref 22–32)
Calcium: 8.5 mg/dL — ABNORMAL LOW (ref 8.9–10.3)
Chloride: 99 mmol/L (ref 98–111)
Creatinine, Ser: 5.39 mg/dL — ABNORMAL HIGH (ref 0.44–1.00)
GFR, Estimated: 8 mL/min — ABNORMAL LOW
Glucose, Bld: 232 mg/dL — ABNORMAL HIGH (ref 70–99)
Potassium: 3.6 mmol/L (ref 3.5–5.1)
Sodium: 137 mmol/L (ref 135–145)
Total Bilirubin: <0.2 mg/dL (ref 0.0–1.2)
Total Protein: 6.6 g/dL (ref 6.5–8.1)

## 2024-10-10 LAB — CBG MONITORING, ED: Glucose-Capillary: 159 mg/dL — ABNORMAL HIGH (ref 70–99)

## 2024-10-10 LAB — I-STAT CG4 LACTIC ACID, ED
Lactic Acid, Venous: 1.6 mmol/L (ref 0.5–1.9)
Lactic Acid, Venous: 1.5 mmol/L (ref 0.5–1.9)

## 2024-10-10 MED ORDER — INSULIN ASPART 100 UNIT/ML IJ SOLN
0.0000 [IU] | Freq: Three times a day (TID) | INTRAMUSCULAR | Status: DC
  Administered 2024-10-12: 1 [IU] via SUBCUTANEOUS
  Filled 2024-10-10: qty 1

## 2024-10-10 MED ORDER — ATORVASTATIN CALCIUM 40 MG PO TABS
40.0000 mg | ORAL_TABLET | Freq: Every day | ORAL | Status: DC
  Administered 2024-10-10 – 2024-10-12 (×3): 40 mg via ORAL
  Filled 2024-10-10 (×3): qty 1

## 2024-10-10 MED ORDER — APIXABAN 2.5 MG PO TABS
2.5000 mg | ORAL_TABLET | Freq: Two times a day (BID) | ORAL | Status: DC
  Administered 2024-10-10 – 2024-10-12 (×4): 2.5 mg via ORAL
  Filled 2024-10-10 (×4): qty 1

## 2024-10-10 NOTE — ED Provider Notes (Signed)
 " [place] EMERGENCY DEPARTMENT AT [facility] Provider Note   CSN: [id number] Arrival date & time: 10/10/24  1729     Patient presents with: Failure To Thrive and Weakness   [name] is a 69 y.o. female.   HPI     69 year old female with history of osteomyelitis of the right leg status post knee amputation, ESRD on hemodialysis M-W-F, type 2 diabetes, paroxysmal A-fib on Eliquis  comes to the emergency room with chief complaint of persistent left-sided hand numbness and difficulty with ADLs.  Patient was just diagnosed with stroke.  It appears that patient was recommended SNF/rehab, but she decided to go home.  She states that she is unable to take care of herself.  She also states that she was never told she could go to rehab.  Patient now not comfortable going home, states that she needs more help  Prior to Admission medications  Medication Sig Start Date End Date Taking? Authorizing Provider  acetaminophen  (TYLENOL ) 500 MG tablet Take 2 tablets (1,000 mg total) by mouth 4 (four) times daily. 06/09/24  Yes King, Schuyler, DO  apixaban  (ELIQUIS ) 5 MG TABS tablet Take 1 tablet (5 mg total) by mouth 2 (two) times daily. Patient taking differently: Take 2.5 mg by mouth 2 (two) times daily. 06/09/24  Yes King, Olivia, DO  ascorbic acid  (VITAMIN C ) 500 MG tablet Take 1 tablet (500 mg total) by mouth 2 (two) times daily. 06/09/24  Yes King, Olivia, DO  atorvastatin  (LIPITOR ) 40 MG tablet Take 1 tablet (40 mg total) by mouth daily. 10/06/24  Yes Lue Elsie BROCKS, MD  brimonidine  (ALPHAGAN ) 0.2 % ophthalmic solution Place 1 drop into both eyes 2 (two) times daily.   Yes [provider]  calcitRIOL  (ROCALTROL ) 0.25 MCG capsule TAKE 1 CAPSULE (0.25 MCG TOTAL) EVERY MONDAY, WEDNESDAY, AND FRIDAY Patient taking differently: Take 0.25 mcg by mouth every Monday, Wednesday, and Friday. 06/22/22  Yes Jagadish, Mayuri, MD  iron  sucrose in sodium chloride  0.9 % 100 mL Inject 100  mg into the vein every Monday, Wednesday, and Friday. 10/09/24 10/19/24 Yes [provider]  latanoprost  (XALATAN ) 0.005 % ophthalmic solution Place 1 drop into both eyes at bedtime.   Yes [provider]  melatonin 5 MG TABS Take 10 mg by mouth at bedtime as needed (for insomnia).   Yes [provider]  Methoxy PEG-Epoetin Beta (MIRCERA IJ) Inject 150 mcg into the vein every 14 (fourteen) days. 06/12/24 10/08/25 Yes [provider]  midodrine  (PROAMATINE ) 2.5 MG tablet Take 2.5 mg by mouth daily as needed (low bp).   Yes [provider]  Multiple Vitamin (MULTIVITAMIN WITH MINERALS) TABS Take 1 tablet by mouth daily.   Yes [provider]  NOVOLIN R 100 UNIT/ML injection Inject 0-9 Units into the skin in the morning, at noon, and at bedtime. Per sliding scale 04/03/24  Yes [provider]  nutrition supplement, JUVEN, (JUVEN) PACK Take 1 packet by mouth 2 (two) times daily between meals. 06/09/24  Yes King, Schuyler, DO  ACCU-CHEK AVIVA PLUS test strip CHECK BLOOD GLUCOSE THREE TIMES DAILY 11/05/22   Joshua Domino, DO  albuterol  (PROVENTIL ) (2.5 MG/3ML) 0.083% nebulizer solution Take 3 mLs (2.5 mg total) by nebulization every 2 (two) hours as needed for wheezing. Patient not taking: Reported on 10/11/2024 11/25/23   Raenelle Donalda HERO, MD  Blood Glucose Monitoring Suppl (ACCU-CHEK AVIVA PLUS) w/Device KIT CHECK BLOOD GLUCOSE THREE TIMES DAILY 10/26/20   Autry-Lott, Rojean, DO  collagenase  (SANTYL ) 250 UNIT/GM ointment Apply topically 2 (two) times daily. 06/09/24   King, Olivia, DO  Darbepoetin Alfa  (ARANESP ) 200 MCG/0.4ML SOSY injection Inject 0.4 mLs (200 mcg total) into the skin every Sunday at 6pm. 06/14/24   Myrna Bitters, DO  DROPLET INSULIN  SYRINGE 31G X 5/16 0.5 ML MISC USE FOUR TIMES DAILY FOR INJECTIONS 09/03/22   Joshua Domino, DO  Insulin  Syringes, Disposable, U-100 0.5 ML MISC 4x daily injections 03/21/18   Riccio, Angela C, DO  Lancet Devices  (ACCU-CHEK SOFTCLIX) lancets Fill for 1 month for TID testing. Use as instructed 11/13/13   Curtis Hadassah DASEN, MD  leptospermum manuka honey (MEDIHONEY) PSTE paste Apply 1 Application topically 2 (two) times daily. Apply with wound changes IF santyl  not available. Patient not taking: Reported on 10/11/2024 06/09/24   Myrna Bitters, DO  NEEDLE, DISP, 30 G (BD DISP NEEDLES) 30G X 1/2 MISC For 4x daily injections 11/05/15   Phelps, Jazma Y, DO  zinc  sulfate, 50mg  elemental zinc , 220 (50 Zn) MG capsule Take 1 capsule (220 mg total) by mouth daily. 06/10/24   Myrna Bitters, DO    Allergies: Nsaids, Lisinopril, and Peanut-containing drug products    Review of Systems  All other systems reviewed and are negative.   Updated Vital Signs BP (!) 131/57   Pulse 76   Temp 98.7 F (37.1 C) (Oral)   Resp 16   SpO2 97%   Physical Exam Vitals and nursing note reviewed.  Constitutional:      Appearance: She is well-developed.  HENT:     Head: Atraumatic.  Cardiovascular:     Rate and Rhythm: Normal rate.  Pulmonary:     Effort: Pulmonary effort is normal.  Musculoskeletal:     Cervical back: Neck supple.  Skin:    General: Skin is warm and dry.  Neurological:     Mental Status: She is alert and oriented to person, place, and time.     (all labs ordered are listed, but only abnormal results are displayed) Labs Reviewed  COMPREHENSIVE METABOLIC PANEL WITH GFR - Abnormal; Notable for the following components:      Result Value   Glucose, Bld 232 (*)    BUN 28 (*)    Creatinine, Ser 5.39 (*)    Calcium  8.5 (*)    Albumin  2.7 (*)    AST 14 (*)    GFR, Estimated 8 (*)    All other components within normal limits  CBC WITH DIFFERENTIAL/PLATELET - Abnormal; Notable for the following components:   WBC 15.7 (*)    RBC 2.95 (*)    Hemoglobin 7.8 (*)    HCT 25.5 (*)    RDW 19.6 (*)    Platelets 438 (*)    Neutro Abs 12.5 (*)    Monocytes Absolute 1.6 (*)    Abs Immature Granulocytes 0.13  (*)    All other components within normal limits  CBG MONITORING, ED - Abnormal; Notable for the following components:   Glucose-Capillary 159 (*)    All other components within normal limits  CBG MONITORING, ED - Abnormal; Notable for the following components:   Glucose-Capillary 144 (*)    All other components within normal limits  I-STAT CG4 LACTIC ACID, ED  I-STAT CG4 LACTIC ACID, ED    EKG: None  Radiology: CT ABDOMEN PELVIS WO CONTRAST Result Date: 10/09/2024 EXAM: CT ABDOMEN AND PELVIS WITHOUT CONTRAST 10/09/2024 10:21:30 PM TECHNIQUE: CT of the abdomen and pelvis was performed without the administration  of intravenous contrast. Multiplanar reformatted images are provided for review. Automated exposure control, iterative reconstruction, and/or weight-based adjustment of the mA/kV was utilized to reduce the radiation dose to as low as reasonably achievable. COMPARISON: 05/29/2024 CLINICAL HISTORY: Abdominal pain, acute, nonlocalized. FINDINGS: LOWER CHEST: No acute abnormality. LIVER: The liver is unremarkable. GALLBLADDER AND BILE DUCTS: Layering high-density material within the gallbladder could be layering contrast material from prior contrast enhanced CT. There were small stones layering in the gallbladder on previous study, obscured by the diffuse high-density material is now present within the gallbladder. No biliary ductal dilatation. SPLEEN: No acute abnormality. PANCREAS: No acute abnormality. ADRENAL GLANDS: No acute abnormality. KIDNEYS, URETERS AND BLADDER: High density material also remains present in the renal collecting systems bilaterally, which could obscure small stones. No hydronephrosis. No perinephric or periureteral stranding. The urinary bladder is filled with contrast, grossly unremarkable. GI AND BOWEL: Stomach demonstrates no acute abnormality. Left lower quadrant ostomy noted, unchanged. No bowel obstruction. PERITONEUM AND RETROPERITONEUM: No free fluid. No free  air. VASCULATURE: Aorta is normal in caliber. Diffuse aortic atherosclerosis. LYMPH NODES: No lymphadenopathy. REPRODUCTIVE ORGANS: Prior hysterectomy. No adnexal masses. BONES AND SOFT TISSUES: No acute osseous abnormality. No focal soft tissue abnormality. IMPRESSION: 1. No acute findings in the abdomen or pelvis. 2. Left lower quadrant ostomy, unchanged, with no bowel obstruction. 3. Aortic atherosclerosis. Electronically signed by: Franky Crease MD 10/09/2024 10:31 PM EST RP Workstation: [id number]   DG Chest Portable 1 View Result Date: 10/09/2024 EXAM: 1 VIEW(S) XRAY OF THE CHEST 10/09/2024 07:28:00 PM COMPARISON: 04/12/2024 CLINICAL HISTORY: hypotension hypotension hypotension FINDINGS: LINES, TUBES AND DEVICES: Left dialysis catheter in place with the tip in the SVC. LUNGS AND PLEURA: No focal pulmonary opacity. No pleural effusion. No pneumothorax. HEART AND MEDIASTINUM: Aortic atherosclerosis. No acute abnormality of the cardiac and mediastinal silhouettes. BONES AND SOFT TISSUES: No acute osseous abnormality. IMPRESSION: 1. No acute process. Electronically signed by: Franky Crease MD 10/09/2024 07:35 PM EST RP Workstation: [id number]     Procedures   Medications Ordered in the ED  atorvastatin  (LIPITOR ) tablet 40 mg (40 mg Oral Given 10/11/24 1000)  apixaban  (ELIQUIS ) tablet 2.5 mg (2.5 mg Oral Given 10/11/24 1000)  insulin  aspart (novoLOG ) injection 0-6 Units ( Subcutaneous Not Given 10/11/24 0756)                                    Medical Decision Making Amount and/or Complexity of Data Reviewed Labs: ordered.   69 year old female previous history of ESRD on hemodialysis, recent history of stroke with left-sided numbness comes in with chief complaint of difficulty of taking care of herself.  She had left without rehab from recent admission for stroke.  I reviewed patient's records including recent discharge summary.  It does appear that social work was engaged and wanting her to be  placed.  Patient has no other complaints. She had come to the ER yesterday with low blood pressure.  Workup was reassuring at that time. Plan is to make her TOC hold.  Patient has significant complexities and comorbidities, therefore we will also consult medicine while patient is awaiting placement through the ER.  Final diagnoses:  Left-sided weakness  Cerebrovascular accident (CVA), unspecified mechanism [facility])    ED Discharge Orders     None          Charlyn Sora, MD 10/10/24 2136    Charlyn Sora, MD 10/11/24 1103  "

## 2024-10-10 NOTE — ED Triage Notes (Signed)
 Pt arrives via EMS from home. Pt was seen yesterday. Patient's son called EMS due to patient having ongoing weakness. Pt recently had a stroke and was discharged home. Pt reports she was supposed to go to [facility] yesterday but was taken at home instead. Pt arrives AxOx4. Wound vac in place to sacral wound. Pt does have left sided deficits from the stroke.

## 2024-10-10 NOTE — Consult Note (Signed)
 Initial Consultation Note   Patient: [name] FMW:[id number] DOB: 04-29-1956 PCP: Campbell Reynolds, NP DOA: 10/10/2024 DOS: the patient was seen and examined on 10/10/2024 Primary service: Charlyn Sora, MD  Referring physician: Dr. Charlyn Reason for consult: Medical management  Assessment and Plan: Generalized weakness/functional debility: Patient with recent admission for stroke, PT/OT recommended SNF which was arranged however patient and family ultimately requested DC to home with HHPT.  Patient now returns seeking placement due to difficulty with care at home.  She has some mild residual left-sided weakness/numbness but no other acute changes.  No acute medical needs identified. - Consult TOC, PT/OT eval  Recent acute/subacute right PCA territory stroke with right PCA occlusion at right P2 segment: Continue Eliquis  and atorvastatin .  Paroxysmal atrial fibrillation: Rate is controlled.  Continue Eliquis .  ESRD on MWF HD: No emergent dialysis needs.  Will need to notify nephrology for routine dialysis if she remains in hospital on Monday, 10/12/2024.  Type 2 diabetes: Hemoglobin A1c 5.8% on 10/06/2024.  Placed on SSI, hypoglycemia protocol.  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  TRH will continue to follow the patient.  HPI:  [name] is a 69 y.o. 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  Patient was  recently admitted on 10/05/2024-10/06/2024.  She presented with left hand numbness, dizziness, and headache during dialysis.  She was found to have an acute to subacute right PCA territory stroke with occlusion of the right PCA at the proximal right P2 segment.  She was seen by neurology who recommended discontinuing aspirin , continuing Eliquis .  She was started on atorvastatin .  She was seen by PT/OT who recommended SNF however patient and family ultimately decided against this and requested DC to home with HHPT.  Patient otherwise denies any acute complaints.  She reports some residual left-sided weakness and numbness in her left hand.  She reports good output from her colostomy.  She is following with wound care clinic for management of her sacral wound and wound VAC.  Review of Systems: As mentioned in the history of present illness. All other systems reviewed and are negative. Past Medical History:  Diagnosis Date   Anemia    Arthritis    Atrial fibrillation (HCC)    CAD (coronary artery disease)    Chronic kidney disease, stage III (moderate) (HCC)    Constipation    Diabetes mellitus (HCC)    ESRD on dialysis (HCC) 04/20/2024   History of blood transfusion    HLD (hyperlipidemia)    Hypertension    Myocardial infarction [facility])    in April 2014   Septic shock (HCC) 04/19/2024   Wound infection after surgery 03/17/2013   Past Surgical History:  Procedure Laterality Date   AMPUTATION Right 04/20/2024   Procedure: AMPUTATION BELOW KNEE;  Surgeon: Harden Jerona GAILS, MD;  Location: [facility] OR;  Service: Orthopedics;  Laterality: Right;   ANKLE SURGERY Right    x 6 total   BREAST BIOPSY Left  15 or 20 yearsa ago she cant remember laterality    HARDWARE REMOVAL Right 02/13/2013   Procedure: HARDWARE REMOVAL;  Surgeon: Jerona LULLA Sage, MD;  Location: [facility] OR;  Service: Orthopedics;  Laterality: Right;  Right Ankle Removal Hardware, Debridement, Place Wound VAC   IR FLUORO GUIDE CV LINE LEFT   11/08/2023   IR FLUORO GUIDE CV LINE LEFT  04/23/2024   IR FLUORO GUIDE CV LINE RIGHT  11/01/2023   IR FLUORO GUIDE CV LINE RIGHT  11/12/2023   IR REMOVAL TUN CV CATH W/O FL  04/21/2024   IR US  GUIDE VASC ACCESS LEFT  11/08/2023   IR US  GUIDE VASC ACCESS LEFT  04/23/2024   IR US  GUIDE VASC ACCESS RIGHT  11/01/2023   IR US  GUIDE VASC ACCESS RIGHT  11/12/2023   laser surgery for diabetic retinopathy Bilateral    LEFT HEART CATHETERIZATION WITH CORONARY ANGIOGRAM N/A 01/09/2013   Procedure: LEFT HEART CATHETERIZATION WITH CORONARY ANGIOGRAM;  Surgeon: Toribio JONELLE Fuel, MD;  Location[facility] CATH LAB;  Service: Cardiovascular;  Laterality: N/A;   ORIF ANKLE FRACTURE Right 01/12/2013   Procedure: OPEN REDUCTION INTERNAL FIXATION (ORIF) ANKLE FRACTURE;  Surgeon: Jerona LULLA Sage, MD;  Location: [facility] OR;  Service: Orthopedics;  Laterality: Right;   PARTIAL COLECTOMY N/A 11/02/2023   Procedure: OPEN PARTIAL COLECTOMY WITH END COLOSTOMY;  Surgeon: Dasie Leonor CROME, MD;  Location: [facility] OR;  Service: General;  Laterality: N/A;   PARTIAL HYSTERECTOMY     REVISION AMPUTATION, BELOW THE KNEE Right 06/24/2024   Procedure: REVISION AMPUTATION, BELOW THE KNEE;  Surgeon: Sage Jerona LULLA, MD;  Location: [facility] OR;  Service: Orthopedics;  Laterality: Right;   TUNNELLED CATHETER EXCHANGE N/A 04/15/2024   Procedure: TUNNELLED CATHETER EXCHANGE;  Surgeon: Pearline Norman RAMAN, MD;  Location: [facility] LAB;  Service: Cardiovascular;  Laterality: N/A;   TUNNELLED CATHETER EXCHANGE N/A 07/23/2024   Procedure: TUNNELLED CATHETER EXCHANGE;  Surgeon: Serene Gaile ORN, MD;  Location: [facility] LAB;  Service: Cardiovascular;  Laterality: N/A;   Social History:  reports that she has never smoked. She has never used smokeless tobacco. She reports that she does not drink alcohol  and does not use drugs.  Allergies[1]  Family History  Problem Relation Age of Onset   Diabetes Mother        No history CAD   Colon cancer Mother 64       died at 26 from CRC    Hypertension Father        Also had CAD   Heart disease Father    Cervical cancer Sister    Diabetes Sister    Congestive Heart Failure Sister    Heart disease Maternal Grandmother    Diabetes Brother    Kidney disease Brother    Sickle cell anemia Son    Thyroid  disease Son    Breast cancer Neg Hx    BRCA 1/2 Neg Hx     Prior to Admission medications  Medication Sig Start Date End Date Taking? Authorizing Provider  ACCU-CHEK AVIVA PLUS test strip CHECK BLOOD GLUCOSE THREE TIMES DAILY 11/05/22   Joshua Domino, DO  acetaminophen  (TYLENOL ) 500 MG tablet Take 2 tablets (1,000 mg total) by mouth 4 (four) times daily. 06/09/24   Myrna Bitters, DO  albuterol  (PROVENTIL ) (2.5 MG/3ML) 0.083% nebulizer solution Take 3 mLs (2.5 mg total) by nebulization every 2 (two) hours as needed for wheezing. 11/25/23   Ghimire, Donalda HERO, MD  apixaban  (ELIQUIS ) 5 MG TABS tablet Take  1 tablet (5 mg total) by mouth 2 (two) times daily. 06/09/24   King, Olivia, DO  ascorbic acid  (VITAMIN C ) 500 MG tablet Take 1 tablet (500 mg total) by mouth 2 (two) times daily. 06/09/24   King, Olivia, DO  atorvastatin  (LIPITOR ) 40 MG tablet Take 1 tablet (40 mg total) by mouth daily. 10/06/24   Lue Elsie BROCKS, MD  Blood Glucose Monitoring Suppl (ACCU-CHEK AVIVA PLUS) w/Device KIT CHECK BLOOD GLUCOSE THREE TIMES DAILY 10/26/20   Autry-Lott, Rojean, DO  brimonidine  (ALPHAGAN ) 0.2 % ophthalmic solution Place 1 drop into both eyes 2 (two) times daily.    [provider]  calcitRIOL  (ROCALTROL ) 0.25 MCG capsule TAKE 1 CAPSULE (0.25 MCG TOTAL) EVERY MONDAY, WEDNESDAY, AND FRIDAY Patient taking differently: Take 0.25 mcg by mouth in the morning and at bedtime. 06/22/22   Christia Budds, MD  collagenase  (SANTYL ) 250 UNIT/GM ointment Apply topically 2 (two) times daily. 06/09/24   King, Olivia, DO  Darbepoetin Alfa  (ARANESP ) 200 MCG/0.4ML SOSY injection Inject 0.4 mLs (200 mcg total) into the skin every Sunday at 6pm. 06/14/24    Myrna Bitters, DO  DROPLET INSULIN  SYRINGE 31G X 5/16 0.5 ML MISC USE FOUR TIMES DAILY FOR INJECTIONS 09/03/22   Joshua Domino, DO  HYDROmorphone  (DILAUDID ) 2 MG tablet Take 0.5 tablets (1 mg total) by mouth every 6 (six) hours as needed for severe pain (pain score 7-10) or moderate pain (pain score 4-6). 06/09/24   King, Olivia, DO  insulin  aspart (NOVOLOG  FLEXPEN) 100 UNIT/ML FlexPen 0-9 Units, Subcutaneous, 3 times daily with meals CBG < 70: Implement Hypoglycemia measures CBG 70 - 120: 0 units CBG 121 - 150: 1 unit CBG 151 - 200: 2 units CBG 201 - 250: 3 units CBG 251 - 300: 5 units CBG 301 - 350: 7 units CBG 351 - 400: 9 units CBG > 400: call MD 11/25/23   Raenelle Donalda HERO, MD  Insulin  Syringes, Disposable, U-100 0.5 ML MISC 4x daily injections 03/21/18   Riccio, Angela C, DO  Lancet Devices (ACCU-CHEK SOFTCLIX) lancets Fill for 1 month for TID testing. Use as instructed 11/13/13   Curtis Hadassah DASEN, MD  latanoprost  (XALATAN ) 0.005 % ophthalmic solution Place 1 drop into both eyes at bedtime.    [provider]  leptospermum manuka honey (MEDIHONEY) PSTE paste Apply 1 Application topically 2 (two) times daily. Apply with wound changes IF santyl  not available. 06/09/24   King, Olivia, DO  melatonin 5 MG TABS Take 10 mg by mouth at bedtime as needed (for insomnia).    [provider]  midodrine  (PROAMATINE ) 2.5 MG tablet Take 2.5 mg by mouth daily as needed (low bp).    [provider]  Multiple Vitamin (MULTIVITAMIN WITH MINERALS) TABS Take 1 tablet by mouth daily.    [provider]  NEEDLE, DISP, 30 G (BD DISP NEEDLES) 30G X 1/2 MISC For 4x daily injections 11/05/15   Phelps, Jazma Y, DO  NOVOLIN R 100 UNIT/ML injection Inject 0-9 Units into the skin in the morning, at noon, and at bedtime. Per sliding scale 04/03/24   [provider]  nutrition supplement, JUVEN, (JUVEN) PACK Take 1 packet by mouth 2 (two) times daily between meals. 06/09/24   King,  Olivia, DO  oxyCODONE  (OXY IR/ROXICODONE ) 5 MG immediate release tablet Take 1 tablet (5 mg total) by mouth every 6 (six) hours as needed for moderate pain (pain score 4-6). 06/30/24   Harden Jerona GAILS, MD  zinc  sulfate, 50mg  elemental  zinc , 220 (50 Zn) MG capsule Take 1 capsule (220 mg total) by mouth daily. 06/10/24   Myrna Bitters, DO    Physical Exam: Vitals:   10/10/24 1729 10/10/24 2000  BP: (!) 103/51 126/65  Pulse: 79 73  Resp: 16   Temp: 98.7 F (37.1 C)   TempSrc: Oral   SpO2: 100% 100%   General: resting in bed HEENT:  EOMI, no scleral icterus Cardiac: RRR, no rubs, murmurs or gallops.  TDC in place left chest Pulm: clear to auscultation bilaterally, moving normal volumes of air Abd: soft, nontender, colostomy in place. Wound VAC in place for sacral ulcer Ext: S/p right BKA. Neuro: alert and oriented X3  Data Reviewed:   There are no new results to review at this time.    Family Communication: Discussed with patient, she is discussed with family Primary team communication: Discussed with EDP Dr. Charlyn Thank you very much for involving us  in the care of your patient.  Author: Jorie Blanch, MD 10/10/2024 9:11 PM  For on call review [email or web address].      [1]  Allergies Allergen Reactions   Nsaids Other (See Comments)    CKD stage 4   Lisinopril Other (See Comments)    coughing   Peanut-Containing Drug Products Itching and Other (See Comments)    GI intolerance - diarrhea

## 2024-10-11 LAB — HEPATITIS B SURFACE ANTIGEN: Hepatitis B Surface Ag: NONREACTIVE

## 2024-10-11 LAB — CBG MONITORING, ED
Glucose-Capillary: 144 mg/dL — ABNORMAL HIGH (ref 70–99)
Glucose-Capillary: 179 mg/dL — ABNORMAL HIGH (ref 70–99)
Glucose-Capillary: 135 mg/dL — ABNORMAL HIGH (ref 70–99)
Glucose-Capillary: 123 mg/dL — ABNORMAL HIGH (ref 70–99)

## 2024-10-11 MED ORDER — CHLORHEXIDINE GLUCONATE CLOTH 2 % EX PADS: 6.0000 | MEDICATED_PAD | Freq: Every day | CUTANEOUS | Status: DC

## 2024-10-11 NOTE — ED Notes (Signed)
 Contacted social work who states they will come speak with the pt and update the her on her plan of care

## 2024-10-11 NOTE — Progress Notes (Signed)
 " [facility] Progress Note   Subjective:   Pt recently admitted with acute CVA, declined SNF and discharged to home, but returned to the ED yesterday with ongoing weakness. Plan is now to discharge to SNF. Pt reports having ongoing weakness and blurry vision, unchanged. She denies SOB, CP, dizziness, HA, abdominal pain, nausea, vomiting.   Objective Vitals:   10/11/24 0500 10/11/24 0715 10/11/24 1100 10/11/24 1230  BP: (!) 105/53 (!) 131/57 135/64 (!) 138/120  Pulse: 77 76 77 80  Resp:      Temp:      TempSrc:      SpO2: 97% 97% 98% 99%   Physical Exam General: Alert female in NAD Heart:RRR, no murmur Lungs: CTA bilaterally, respirations unlabored Abdomen: Soft, non-distended  Extremities: no edema b/l lower extremities Dialysis Access: [facility]  Additional Objective Labs: Basic Metabolic Panel: Recent Labs  Lab 10/06/24 0627 10/09/24 1900 10/10/24 1750  NA 139 135 137  K 3.9 3.5 3.6  CL 101 98 99  CO2 28 26 25   GLUCOSE 240* 197* 232*  BUN 29* 19 28*  CREATININE 5.31* 3.92* 5.39*  CALCIUM  8.8* 7.7* 8.5*  PHOS 3.9  --   --    Liver Function Tests: Recent Labs  Lab 10/06/24 0627 10/10/24 1750  AST  --  14*  ALT  --  9  ALKPHOS  --  73  BILITOT  --  <0.2  PROT  --  6.6  ALBUMIN  3.1* 2.7*   No results for input(s): LIPASE, AMYLASE in the last 168 hours. CBC: Recent Labs  Lab 10/05/24 2026 10/06/24 0627 10/09/24 1900 10/10/24 1750  WBC 12.7* 11.2* 17.0* 15.7*  NEUTROABS 10.3* 8.3*  --  12.5*  HGB 8.7* 9.0* 8.0* 7.8*  HCT 28.3* 30.0* 25.8* 25.5*  MCV 85.5 86.2 85.4 86.4  PLT 334 367 421* 438*   Blood Culture    Component Value Date/Time   SDES TISSUE 06/24/2024 1014   SPECREQUEST RIGHT KNEE 06/24/2024 1014   CULT  06/24/2024 1014    RARE ENTEROBACTER CLOACAE NO ANAEROBES ISOLATED Performed at [facility], [address] [place], [place] [address]    REPTSTATUS 06/29/2024 FINAL 06/24/2024 1014    Cardiac Enzymes: No  results for input(s): CKTOTAL, CKMB, CKMBINDEX, TROPONINI in the last 168 hours. CBG: Recent Labs  Lab 10/06/24 0213 10/06/24 1237 10/10/24 2150 10/11/24 0754 10/11/24 1202  GLUCAP 244* 184* 159* 144* 179*   Iron  Studies: No results for input(s): IRON , TIBC, TRANSFERRIN, FERRITIN in the last 72 hours. @lablastinr3 @ Studies/Results: CT ABDOMEN PELVIS WO CONTRAST Result Date: 10/09/2024 EXAM: CT ABDOMEN AND PELVIS WITHOUT CONTRAST 10/09/2024 10:21:30 PM TECHNIQUE: CT of the abdomen and pelvis was performed without the administration of intravenous contrast. Multiplanar reformatted images are provided for review. Automated exposure control, iterative reconstruction, and/or weight-based adjustment of the mA/kV was utilized to reduce the radiation dose to as low as reasonably achievable. COMPARISON: 05/29/2024 CLINICAL HISTORY: Abdominal pain, acute, nonlocalized. FINDINGS: LOWER CHEST: No acute abnormality. LIVER: The liver is unremarkable. GALLBLADDER AND BILE DUCTS: Layering high-density material within the gallbladder could be layering contrast material from prior contrast enhanced CT. There were small stones layering in the gallbladder on previous study, obscured by the diffuse high-density material is now present within the gallbladder. No biliary ductal dilatation. SPLEEN: No acute abnormality. PANCREAS: No acute abnormality. ADRENAL GLANDS: No acute abnormality. KIDNEYS, URETERS AND BLADDER: High density material also remains present in the renal collecting systems bilaterally, which could obscure small  stones. No hydronephrosis. No perinephric or periureteral stranding. The urinary bladder is filled with contrast, grossly unremarkable. GI AND BOWEL: Stomach demonstrates no acute abnormality. Left lower quadrant ostomy noted, unchanged. No bowel obstruction. PERITONEUM AND RETROPERITONEUM: No free fluid. No free air. VASCULATURE: Aorta is normal in caliber. Diffuse aortic  atherosclerosis. LYMPH NODES: No lymphadenopathy. REPRODUCTIVE ORGANS: Prior hysterectomy. No adnexal masses. BONES AND SOFT TISSUES: No acute osseous abnormality. No focal soft tissue abnormality. IMPRESSION: 1. No acute findings in the abdomen or pelvis. 2. Left lower quadrant ostomy, unchanged, with no bowel obstruction. 3. Aortic atherosclerosis. Electronically signed by: Franky Crease MD 10/09/2024 10:31 PM EST RP Workstation: [id number]   DG Chest Portable 1 View Result Date: 10/09/2024 EXAM: 1 VIEW(S) XRAY OF THE CHEST 10/09/2024 07:28:00 PM COMPARISON: 04/12/2024 CLINICAL HISTORY: hypotension hypotension hypotension FINDINGS: LINES, TUBES AND DEVICES: Left dialysis catheter in place with the tip in the SVC. LUNGS AND PLEURA: No focal pulmonary opacity. No pleural effusion. No pneumothorax. HEART AND MEDIASTINUM: Aortic atherosclerosis. No acute abnormality of the cardiac and mediastinal silhouettes. BONES AND SOFT TISSUES: No acute osseous abnormality. IMPRESSION: 1. No acute process. Electronically signed by: Kevin Dover MD 10/09/2024 07:35 PM EST RP Workstation: [id number]   Medications:   apixaban   2.5 mg Oral BID   atorvastatin   40 mg Oral Daily   insulin  aspart  0-6 Units Subcutaneous TID WC    Dialysis Orders: [facility] MWF 180NRe 4 hours BFR 350 DFR A 1.5 EDW 53kg 2K 2.5Ca TDC Heparin  2400 unit bolus Mircera 150mcg IV q 2 weeks Venofer  100mg  weekly q HD started 10/09/24 Calcitriol  0.31mcg PO q HD  Assessment/Plan: 1. Weakness: S/p recent CVA, now agreeable to SNF. Boarding in ED pending placement.  2. ESRD: On mWF schedule, no emergent indications for HD today, next dialysis Monday 3. HTN/volume:  No evidence of volume overload on exam. UF as tolerated tomorrow. Will try to get a more accurate weight if pt is able to stand 4. Anemia: Hgb 7.8, recently started course of venofer , will continue here 5. Secondary hyperparathyroidism:   Calcium  at goal, follow phos. Does not  appear to be on a binder 6. Nutrition: Renal diet with fluid restrictions    Lucie Collet, PA-C 10/11/2024, 12:45 PM  [facility] Pager: [phone]   "

## 2024-10-11 NOTE — Evaluation (Signed)
 Physical Therapy Evaluation Patient Details Name: [name] MRN: [id number] DOB: [birth year] Today's Date: 10/11/2024  History of Present Illness  69 y.o. F who presented to [facility] ED 10/10/24 for persistent left-sided hand numbness and difficulty with ADLs. Pt was recently diagnosed with a stroke and therapy evaluated and recommended skilled short-term rehab, which the pt declined and decided to go home. Now presents with inability to take care of herself. 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 Of note, recent hospitalizations earlier this month: 1/02 for hypotension, 1/12 for R parietal and PCA territory infarct CVA, and 1/16 for hypotension.   Clinical Impression  Pt admitted with above diagnosis. PTA, pt was modI for functional mobility using RW and R prosthesis for household ambulation and w/c for community ambulation. She lives with her sons in a house with 5 STE where she resides on the main level. Pt currently with functional limitations due to the deficits listed below (see PT Problem List). She required minA for bed mobility and min-modA for sit<>stand using RW. Pt quickly fatigued in static stance. She was unsteady given absence of R BKA prosthesis. Pt reported she is unable to hop or pivot. Pt will benefit from acute skilled PT to increase her independence and safety with mobility to allow discharge. Recommend continued inpatient follow up therapy, <3 hours/day.    If plan is discharge home, recommend the following: A lot of help with walking and/or transfers;A lot of help with bathing/dressing/bathroom;Assistance with cooking/housework;Assist for transportation;Help with stairs or ramp for entrance;Supervision due to cognitive status   Can travel by private vehicle   Yes    Equipment Recommendations None recommended by PT  Recommendations for Other Services        Functional Status Assessment Patient has had a recent decline in their functional status and demonstrates the ability to make significant improvements in function in a reasonable and predictable amount of time.     Precautions / Restrictions Precautions Precautions: Fall Recall of Precautions/Restrictions: Impaired Precaution/Restrictions Comments: Pt reports she is legally blind; R prosthesis Restrictions Weight Bearing Restrictions Per Provider Order: No      Mobility  Bed Mobility Overal bed mobility: Needs Assistance Bed Mobility: Supine to Sit, Sit to Supine     Supine to sit: Min assist Sit to supine: Min assist   General bed mobility comments: Pt sat up on R side of the stretcher with increased time and effort. Assist to bring LLE back onto bed as it was falling off the opposite side of the stretcher. Cues for sequencing. Assist to bring BLE off EOB and elevate trunk. Returning to bed pt controlled trunk down, assist to help BLE. She opted to remain sidelying.    Transfers Overall transfer level: Needs assistance Equipment used: Rolling walker (2 wheels) Transfers: Sit to/from Stand Sit to Stand: Min assist, Mod assist           General transfer comment: Pt stood from stretcher. Cued proper hand placement using RW. Powered up with minA. Pt leaning towards LLE d/t lack of R prosthesis. She was unsteady requring up to modA. Pt quickly fatigued. Fair eccentric control.    Ambulation/Gait               General Gait Details: Unable - pt reports she cannot hop given abscence of R prosthesis  Stairs  Wheelchair Mobility     Tilt Bed    Modified Rankin (Stroke Patients Only)       Balance Overall balance assessment: Needs assistance Sitting-balance support: Bilateral upper extremity supported, Feet supported Sitting balance-Leahy Scale: Fair Sitting balance - Comments: Sat EOB   Standing balance support: Bilateral upper extremity  supported, During functional activity, Reliant on assistive device for balance Standing balance-Leahy Scale: Poor Standing balance comment: Pt dependent on RW and min-modA.                             Pertinent Vitals/Pain Pain Assessment Pain Assessment: No/denies pain    Home Living Family/patient expects to be discharged to:: Skilled nursing facility Living Arrangements: Children (3 sons) Available Help at Discharge: Family;Available 24 hours/day Type of Home: House Home Access: Level entry     Alternate Level Stairs-Number of Steps: 5 Home Layout: Multi-level;Able to live on main level with bedroom/bathroom Home Equipment: [place] - single point;Wheelchair - Forensic Psychologist (2 wheels);Shower seat;BSC/3in1;Lift chair;Hospital bed;Wheelchair - power      Prior Function Prior Level of Function : Patient poor historian/Family not available;Independent/Modified Independent;Needs assist             Mobility Comments: Ambulates using RW and R prosthesis within the home & w/c in the community which she can self-propel. Denies fall hx. ADLs Comments: Pt reports she needs assistance with her colostomy care. Sponge baths. Will use BSC. Sons do wound care and IADLs     Extremity/Trunk Assessment   Upper Extremity Assessment Upper Extremity Assessment: Defer to OT evaluation    Lower Extremity Assessment Lower Extremity Assessment: Generalized weakness       Communication   Communication Communication: No apparent difficulties    Cognition Arousal: Alert Behavior During Therapy: WFL for tasks assessed/performed   PT - Cognitive impairments: No family/caregiver present to determine baseline, Orientation   Orientation impairments: Situation (Pt reports her son got scared and wanted her to come back to the hospital in order to get rehab)                   PT - Cognition Comments: Pt is a questionable historian. She has decreased insight into current  situation. Pt is eager to return home reporting her son made her come here and wants her to go to rehab. Following commands: Intact       Cueing Cueing Techniques: Verbal cues, Gestural cues     General Comments General comments (skin integrity, edema, etc.): BP: sitting 144/64; unable to obtain standing as pt quickly fatigued.    Exercises     Assessment/Plan    PT Assessment Patient needs continued PT services  PT Problem List Decreased strength;Decreased balance;Decreased mobility;Decreased cognition;Decreased safety awareness       PT Treatment Interventions DME instruction;Therapeutic activities;Gait training;Therapeutic exercise;Stair training;Balance training;Functional mobility training;Neuromuscular re-education;Patient/family education;Cognitive remediation    PT Goals (Current goals can be found in the Care Plan section)  Acute Rehab PT Goals Patient Stated Goal: Return Home PT Goal Formulation: With patient Time For Goal Achievement: 10/25/24 Potential to Achieve Goals: Fair    Frequency Min 2X/week     Co-evaluation               AM-PAC PT 6 Clicks Mobility  Outcome Measure Help needed turning from your back to your side while in a flat bed without using bedrails?: A Little Help needed moving from lying on your back to  sitting on the side of a flat bed without using bedrails?: A Little Help needed moving to and from a bed to a chair (including a wheelchair)?: A Lot Help needed standing up from a chair using your arms (e.g., wheelchair or bedside chair)?: A Lot Help needed to walk in hospital room?: Total Help needed climbing 3-5 steps with a railing? : Total 6 Click Score: 12    End of Session Equipment Utilized During Treatment: Gait belt Activity Tolerance: Patient tolerated treatment well;Patient limited by fatigue Patient left: in bed;with call bell/phone within reach Nurse Communication: Mobility status;Need for lift equipment (stedy to get  to [facility]) PT Visit Diagnosis: Unsteadiness on feet (R26.81);Other abnormalities of gait and mobility (R26.89);Muscle weakness (generalized) (M62.81)    Time: 8970-8951 PT Time Calculation (min) (ACUTE ONLY): 19 min   Charges:   PT Evaluation $PT Eval Moderate Complexity: 1 Mod   PT General Charges $$ ACUTE PT VISIT: 1 Visit         Randall SAUNDERS, PT, DPT Acute Rehabilitation Services Office: [phone] Secure Chat Preferred  Delon CHRISTELLA Callander 10/11/2024, 12:56 PM

## 2024-10-11 NOTE — ED Provider Notes (Signed)
 Emergency Medicine Observation Re-evaluation Note  [name] is a 69 y.o. female, seen on rounds today.  Pt initially presented to the ED for complaints of Failure To Thrive and Weakness Currently, the patient is resting comfortably.  She is awake.  She inquired about her hemodialysis, and I reassured her that I will consult nephrology today to make sure she is on the list.  No updates otherwise.  Per nursing staff, patient appears to be easily forgetful.  Physical Exam  BP (!) 131/57   Pulse 76   Temp 98.7 F (37.1 C) (Oral)   Resp 16   SpO2 97%  Physical Exam General: No acute distress Cardiac: Regular rate Lungs: No respiratory distress Psych: Currently calm  ED Course / MDM  EKG:   I have reviewed the labs performed to date as well as medications administered while in observation.  Recent changes in the last 24 hours include none.   Plan  Current plan is for holding patient for social work evaluation and placement.  I am adding PT-OT and wound care consults.   Charlyn Sora, MD 10/11/24 1124

## 2024-10-11 NOTE — ED Notes (Signed)
Social work at bedside speaking with pt

## 2024-10-11 NOTE — ED Notes (Signed)
 Pt at bedside

## 2024-10-11 NOTE — TOC Initial Note (Addendum)
 Transition of Care [facility]) - Initial/Assessment Note    Patient Details  Name: [name] MRN: [id number] Date of Birth: 1956-08-01  [facility] of [facility] CM/SW Contact:    Hartley KATHEE Robertson, LCSWA Phone Number: 10/11/2024, 2:58 PM  Clinical Narrative:                  CSW spoke with pt at bedside, pt states she wants to go home but understands the need for rehab, states she wants to go to  [facility] as it's closer to her home. CSW tried to explain the insurance auth process and Medicare ratings site but pt wasn't paying attention, just asked CSW how long it would take, CSW advised a couple of days. Pt gave permission to speak with her son about the SNF process. CSW spoke with pt about staying safe and told her that being in home may not be the safest place at this time; pt verbalized understanding. CSW spoke with MD who confirmed pt is medically ready for SNF workup. Pt's son updated on the plan and is in agreement.  Per previous CSW notes, pt is not managed by [facility] will need to be started by SNF.   Expected Discharge Plan: Skilled Nursing Facility Barriers to Discharge: Continued Medical Work up, English As A Second Language Teacher   Patient Goals and CMS Choice Patient states their goals for this hospitalization and ongoing recovery are:: get stronger          Expected Discharge Plan and Services In-house Referral: Clinical Social Work                                            Prior Living Arrangements/Services   Lives with:: Self Patient language and need for interpreter reviewed:: Yes Do you feel safe going back to the place where you live?: Yes      Need for Family Participation in Patient Care: Yes (Comment) Care giver support system in place?: No (comment)   Criminal Activity/Legal Involvement Pertinent to Current Situation/Hospitalization: No - Comment as needed  Activities of Daily Living      Permission Sought/Granted Permission sought to  share information with : Facility Medical Sales Representative, Family Supports Permission granted to share information with : Yes, Verbal Permission Granted  Share Information with NAME: Lorantz,[name]  Permission granted to share info w AGENCY: SNFs  Permission granted to share info w Relationship: Son  Permission granted to share info w Contact Information: [phone]  Emotional Assessment Appearance:: Appears older than stated age Attitude/Demeanor/Rapport: Lethargic Affect (typically observed): Flat, Calm, Frustrated Orientation: : Fluctuating Orientation (Suspected and/or reported Sundowners) Alcohol  / Substance Use: Not Applicable Psych Involvement: No (comment)  Admission diagnosis:  Generalized weakness failure to thirve stroke sx Patient Active Problem List   Diagnosis Date Noted   CVA (cerebral vascular accident) (HCC) 10/06/2024   Non-healing wound of amputation stump (HCC) 06/24/2024   Wound dehiscence 06/04/2024   Severe protein-calorie malnutrition 06/03/2024   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) 05/30/2024   Hypoalbuminemia due to protein-calorie malnutrition 05/30/2024   Osteomyelitis of vertebra, sacral and sacrococcygeal region (HCC) 05/29/2024   Primary hypercoagulable state 05/29/2024   BKA stump complication (HCC) 05/29/2024   Normocytic anemia 05/29/2024   Does not feel safe at home 05/29/2024   Colostomy in place [facility]) 05/29/2024   Diabetes mellitus with kidney complication, with long-term current use of insulin  (  HCC) 05/29/2024   Osteomyelitis (HCC) 04/20/2024   MRSA bacteremia 04/20/2024   Bacteremia 04/20/2024   Necrotizing fasciitis (HCC) 04/20/2024   ESRD (end stage renal disease) (HCC) 04/20/2024   Irritant contact dermatitis associated with fecal stoma 12/27/2023   Colostomy complication, unspecified (HCC) 12/27/2023   Diverticulitis of large intestine with complication 10/27/2023   Foot callus 02/27/2021   Counseled about COVID-19 virus  infection 02/24/2020   Aortic heart murmur 05/15/2019   Thyroid  enlargement 07/19/2014   Vitamin D  deficiency 09/03/2013   Hyperlipidemia 04/24/2013   Diabetic retinopathy (HCC) 01/23/2013   Chronic atrial fibrillation (HCC) 01/15/2013   Aortic atherosclerosis 01/11/2013   H/O non-ST elevation myocardial infarction (NSTEMI) 01/09/2013   History of stroke 01/08/2013   Diabetic neuropathy 01/08/2013   Hypertension    Diabetes mellitus    PCP:  Campbell Reynolds, NP Pharmacy:   [facility] - [place], [place] - [address] [place] [address] Phone: [phone] Fax: [phone]  [facility] [address] [place]), [place] - [address] [place] [address] Phone: [phone] Fax: [phone]  [id number] - [place], [place] - [address] [place] [address] Phone: [phone] Fax: [phone]  [facility] [address] [place] [address] Phone: [phone] Fax: [phone]     Social Drivers of Health (SDOH) Social History: SDOH Screenings   Food Insecurity: No Food Insecurity (06/24/2024)  Housing: Unknown (06/24/2024)  Transportation Needs: No Transportation Needs (06/24/2024)  Utilities: Not At Risk (06/24/2024)  Social Connections: Moderately Isolated (06/24/2024)  Tobacco Use: Low Risk (10/10/2024)   SDOH Interventions:     Readmission Risk Interventions     No data to display

## 2024-10-11 NOTE — Consult Note (Addendum)
 Patient is familiar to [facility] team with sacral wound and NPWT; last seen by [facility] 1/13; is followed at wound care center with last visit 1/18 NPWT changed at that time   [facility] Nurse ostomy consult note; patient well known to [facility] team from previous consults; has been educated and is independent in care Stoma type/location: LLQ colostomy Stomal assessment/size: last assessment approx 30 mm  Peristomal assessment:  Treatment options for stomal/peristomal skin: 2 barrier ring Gerlean [phone]  Ostomy pouching: 1pc patient uses [place]ister 2 1/4  Gerlean HOYLE) with barrier rings on both sides of abd crease and around stoma Education provided: none, educated on prior visits  Enrolled patient in [employer] DC program: no Supplies: 1-piece ostomy 2 1/4 #725, barrier ring 2 inch [phone]  WOC team will follow Monday 1/19 to assess ostomy and follow NPWT to sacrum.  Secure chat sent to primary nurse regarding above.   Thank you,    Powell Bar MSN, RN-BC, [employer]

## 2024-10-12 LAB — CBG MONITORING, ED
Glucose-Capillary: 119 mg/dL — ABNORMAL HIGH (ref 70–99)
Glucose-Capillary: 156 mg/dL — ABNORMAL HIGH (ref 70–99)

## 2024-10-12 NOTE — Evaluation (Signed)
 Occupational Therapy Evaluation Patient Details Name: [name] MRN: [id number] DOB: [birth year] Today's Date: 10/12/2024   History of Present Illness   69 y.o. F who presented to [facility] ED 10/10/24 for  persistent left-sided hand numbness and difficulty with ADLs. Pt was recently diagnosed with a stroke and therapy evaluated and recommended skilled short-term rehab, which the pt declined and decided to go home. She now reports she is unable to take care of herself. 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 Of note, recent hospitalizations earlier this month: 1/02 for hypotension, 1/12 for R parietal and PCA territory infarct CVA, and 1/16 for hypotension.     Clinical Impressions Paityn was evaluated s/p the above admission list. Unsure of accuracy of PLOF - pt states she is mod I for mobility with RW and uses workout equipment daily at home and her sons assist with colostomy mgmt at baseline. Upon evaluation the pt was limited by agitation, low vision, weakness, sacral wound, unsteady balance and poor activity tolerance. Overall she needed mod A for all aspects of mobility and declined a full standing or OOB attempt. Due to the deficits listed below the pt also needs max A for LB ADLs and min A for UB ADLs at bed level. Pt will benefit from continued acute OT services and skilled inpatient follow up therapy, <3 hours/day.      If plan is discharge home, recommend the following:   A lot of help with walking and/or transfers;A lot of help with bathing/dressing/bathroom;Assistance with cooking/housework;Assist for transportation     Functional Status Assessment   Patient has had a recent decline in their functional status and demonstrates the ability to make significant improvements in function in a reasonable and predictable amount of time.     Equipment Recommendations   None  recommended by OT      Precautions/Restrictions   Precautions Precautions: Fall Recall of Precautions/Restrictions: Impaired Precaution/Restrictions Comments: Pt reports she is legally blind; R prosthesis Restrictions Weight Bearing Restrictions Per Provider Order: No     Mobility Bed Mobility Overal bed mobility: Needs Assistance Bed Mobility: Supine to Sit, Sit to Supine     Supine to sit: Min assist Sit to supine: Min assist   General bed mobility comments: Pt sat up on R side of the stretcher with increased time and effort. Assist to bring LLE back onto bed as it was falling off the opposite side of the stretcher. Cues for sequencing. Assist to bring BLE off EOB and elevate trunk. Returning to bed pt controlled trunk down, assist to help BLE. She opted to remain sidelying.    Transfers Overall transfer level: Needs assistance Equipment used: Rolling walker (2 wheels) Transfers: Sit to/from Stand Sit to Stand: Mod assist           General transfer comment: does not have prosthetic, refused a full stand after 1 attempt      Balance Overall balance assessment: Needs assistance Sitting-balance support: Bilateral upper extremity supported, Feet supported Sitting balance-Leahy Scale: Fair Sitting balance - Comments: Sat EOB   Standing balance support: Bilateral upper extremity supported, During functional activity, Reliant on assistive device for balance Standing balance-Leahy Scale: Poor Standing balance comment: Pt dependent on RW and min-modA.                           ADL either performed  or assessed with clinical judgement   ADL Overall ADL's : Needs assistance/impaired Eating/Feeding: Sitting;Moderate assistance Eating/Feeding Details (indicate cue type and reason): due to low vision Grooming: Set up;Sitting   Upper Body Bathing: Minimal assistance;Sitting   Lower Body Bathing: Maximal assistance;Bed level   Upper Body Dressing : Minimal  assistance;Sitting   Lower Body Dressing: Maximal assistance;Bed level   Toilet Transfer: Moderate assistance;Stand-pivot;BSC/3in1 Statistician Details (indicate cue type and reason): HHA, face to face Toileting- Clothing Manipulation and Hygiene: Total assistance Toileting - Clothing Manipulation Details (indicate cue type and reason): colostomy, pt does not manage at baseline     Functional mobility during ADLs: Moderate assistance General ADL Comments: pt refusing most assessment often putting the blanket over her head. perseverating on going home.     Vision Baseline Vision/History: 2 Legally blind Ability to See in Adequate Light: 3 Highly impaired Patient Visual Report: No change from baseline Additional Comments: sees shapes/shadows     Perception Perception: Not tested       Praxis Praxis: Not tested       Pertinent Vitals/Pain Pain Assessment Pain Assessment: No/denies pain     Extremity/Trunk Assessment Upper Extremity Assessment Upper Extremity Assessment: Generalized weakness   Lower Extremity Assessment Lower Extremity Assessment: Defer to PT evaluation   Cervical / Trunk Assessment Cervical / Trunk Assessment: Normal   Communication Communication Communication: No apparent difficulties   Cognition Arousal: Alert Behavior During Therapy: WFL for tasks assessed/performed Cognition: Cognition impaired   Orientation impairments: Situation Awareness: Online awareness impaired Memory impairment (select all impairments): Working civil service fast streamer, Copywriter, advertising Attention impairment (select first level of impairment): Alternating attention Executive functioning impairment (select all impairments): Problem solving, Reasoning OT - Cognition Comments: inconsistent report given throughout. Pt states she spends a lot of time in bed/in a chair but also states she uses exercise equipment daily. She states she cannot eat because she has a colostomy.  Perseverating throughout on wanting to go home                 Following commands: Intact       Cueing  General Comments   Cueing Techniques: Verbal cues;Gestural cues  VSS, pt perseverating at home           Home Living Family/patient expects to be discharged to:: Skilled nursing facility Living Arrangements: Children (3 sons) Available Help at Discharge: Family;Available 24 hours/day Type of Home: House Home Access: Level entry     Home Layout: Multi-level;Able to live on main level with bedroom/bathroom Alternate Level Stairs-Number of Steps: 5 Alternate Level Stairs-Rails: Left Bathroom Shower/Tub: Walk-in shower;Sponge bathes at baseline   Bathroom Toilet: Standard Bathroom Accessibility: No   Home Equipment: Cane - single point;Wheelchair - Forensic Psychologist (2 wheels);Shower seat;BSC/3in1;Lift chair;Hospital bed;Wheelchair - power          Prior Functioning/Environment Prior Level of Function : Patient poor historian/Family not available;Independent/Modified Independent;Needs assist             Mobility Comments: Ambulates using RW and R prosthesis within the home & w/c in the community which she can self-propel. Denies fall hx. unsure of accuracy - pt states she uses exercise equipment daily including a tredmill, exercise mirror, weights, etc. ADLs Comments: Pt reports she needs assistance with her colostomy care. Sponge baths. Will use BSC. Sons do wound care and IADLs    OT Problem List: Impaired balance (sitting and/or standing);Impaired vision/perception   OT Treatment/Interventions: Self-care/ADL training;DME and/or AE instruction;Balance training;Patient/family education  OT Goals(Current goals can be found in the care plan section)   Acute Rehab OT Goals Patient Stated Goal: to go home OT Goal Formulation: With patient Time For Goal Achievement: 10/26/24 Potential to Achieve Goals: Good ADL Goals Pt Will Perform Upper Body  Dressing: with set-up Pt Will Perform Lower Body Dressing: with min assist;sitting/lateral leans;sit to/from stand Pt Will Transfer to Toilet: with contact guard assist;stand pivot transfer;bedside commode   OT Frequency:  Min 2X/week       AM-PAC OT 6 Clicks Daily Activity     Outcome Measure Help from another person eating meals?: A Lot Help from another person taking care of personal grooming?: A Little Help from another person toileting, which includes using toliet, bedpan, or urinal?: A Lot Help from another person bathing (including washing, rinsing, drying)?: A Little Help from another person to put on and taking off regular upper body clothing?: A Lot   6 Click Score: 12   End of Session Nurse Communication: Mobility status  Activity Tolerance: Patient tolerated treatment well Patient left: in bed;with call bell/phone within reach  OT Visit Diagnosis: Unsteadiness on feet (R26.81);Other abnormalities of gait and mobility (R26.89);Muscle weakness (generalized) (M62.81);Low vision, both eyes (H54.2)                Time: 1024-1040 OT Time Calculation (min): 16 min Charges:  OT General Charges $OT Visit: 1 Visit OT Evaluation $OT Eval Moderate Complexity: 1 Mod  Lucie Kendall, OTR/L Acute Rehabilitation Services Office [phone] Secure Chat Communication Preferred   Lucie JONETTA Kendall 10/12/2024, 1:14 PM

## 2024-10-12 NOTE — Discharge Instructions (Signed)
 Follow-up with your primary doctor in 2 to 3 days.  Follow-up with wound clinic about your sacral wound.  Home health PT, OT, nursing, and health aides have been ordered for you and they will be reaching out to you shortly.

## 2024-10-12 NOTE — ED Provider Notes (Signed)
 Emergency Medicine Observation Re-evaluation Note  [name] is a 69 y.o. female, seen on rounds today.  Pt initially presented to the ED for complaints of Failure To Thrive and Weakness Currently, the patient is not having acute complaints.  Physical Exam  BP (!) 140/77   Pulse 80   Temp 98.7 F (37.1 C) (Oral)   Resp 20   Ht 5' 7 (1.702 m)   Wt 57.6 kg   SpO2 97%   BMI 19.89 kg/m  Physical Exam General: Resting comfortably in stretcher Lungs: Normal work of breathing Psych: Calm  ED Course / MDM  EKG:   I have reviewed the labs performed to date as well as medications administered while in observation.  Recent changes in the last 24 hours include dialysis yesterday.  Saw PT yesterday.  Plan  Current plan is for placement.    [name], MD 10/12/24 [phone]

## 2024-10-12 NOTE — ED Provider Notes (Signed)
 Patient is requesting to go home at this point in time.  Is very frustrated with her care.  Her son [name] is here and wanting to pick her up.  Had a discussion with her about why that sounds like they are frustrated in interaction with one of the staff members.  I apologized for this but it is unclear exactly what happened or what was said.  Did explain to them that we were able to find placement at [facility] for her tomorrow.  Explained that I am concerned about her going home at this point in time especially since she just went home and bounced back because they were unable to care for her at home.  They report that they have a wheelchair and are going to find placement for her.  Also explained that she has a wound VAC that will require specialized treatment and care.  They explained that they understood and still would like to go home.  They do have capacity to make that decision. Given referral for home health PT, OT, NA, and RN.  Also given extermination for wound care clinic to follow-up with.  Patient discharged home against medical advice.    [name], MD 10/12/24 1816

## 2024-10-12 NOTE — [place] FL2 (Signed)
 " [place]  MEDICAID FL2 LEVEL OF CARE FORM     IDENTIFICATION  Patient Name: [name] Birthdate: 17-Nov-1955 Sex: female Admission Date (Current Location): 10/10/2024  [facility] and [place] Number:  Producer, Television/film/video and Address:  [facility] [place], [address] [place], [place] [address]      Provider Number: [id number]  Attending Physician Name and Address:  [name], MD  Relative Name and Phone Number:  [name], [phone]    Current Level of Care: Hospital Recommended Level of Care: Skilled Nursing Facility Prior Approval Number:    Date Approved/Denied:   PASRR Number: [id number]  Discharge Plan: SNF    Current Diagnoses: Patient Active Problem List   Diagnosis Date Noted   CVA (cerebral vascular accident) (HCC) 10/06/2024   Non-healing wound of amputation stump (HCC) 06/24/2024   Wound dehiscence 06/04/2024   Severe protein-calorie malnutrition 06/03/2024   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) 05/30/2024   Hypoalbuminemia due to protein-calorie malnutrition 05/30/2024   Osteomyelitis of vertebra, sacral and sacrococcygeal region (HCC) 05/29/2024   Primary hypercoagulable state 05/29/2024   BKA stump complication (HCC) 05/29/2024   Normocytic anemia 05/29/2024   Does not feel safe at home 05/29/2024   Colostomy in place [facility]) 05/29/2024   Diabetes mellitus with kidney complication, with long-term current use of insulin  (HCC) 05/29/2024   Osteomyelitis (HCC) 04/20/2024   MRSA bacteremia 04/20/2024   Bacteremia 04/20/2024   Necrotizing fasciitis (HCC) 04/20/2024   ESRD (end stage renal disease) (HCC) 04/20/2024   Irritant contact dermatitis associated with fecal stoma 12/27/2023   Colostomy complication, unspecified (HCC) 12/27/2023   Diverticulitis of large intestine with complication 10/27/2023   Foot callus 02/27/2021   Counseled about COVID-19 virus infection 02/24/2020   Aortic heart murmur  05/15/2019   Thyroid  enlargement 07/19/2014   Vitamin D  deficiency 09/03/2013   Hyperlipidemia 04/24/2013   Diabetic retinopathy (HCC) 01/23/2013   Chronic atrial fibrillation (HCC) 01/15/2013   Aortic atherosclerosis 01/11/2013   H/O non-ST elevation myocardial infarction (NSTEMI) 01/09/2013   History of stroke 01/08/2013   Diabetic neuropathy 01/08/2013   Hypertension    Diabetes mellitus     Orientation RESPIRATION BLADDER Height & Weight     Self, Time, Situation, Place    Continent Weight: 126 lb 15.8 oz (57.6 kg) Height:  5' 7 (170.2 cm)  BEHAVIORAL SYMPTOMS/MOOD NEUROLOGICAL BOWEL NUTRITION STATUS      Continent    AMBULATORY STATUS COMMUNICATION OF NEEDS Skin   Limited Assist Verbally Other (Comment) (Stoma type/location: LLQ colostomy)                       Personal Care Assistance Level of Assistance  Bathing, Feeding, Dressing Bathing Assistance: Limited assistance Feeding assistance: Limited assistance Dressing Assistance: Limited assistance     Functional Limitations Info  Sight, Hearing, Speech Sight Info: Impaired Hearing Info: Adequate Speech Info: Adequate    SPECIAL CARE FACTORS FREQUENCY                       Contractures      Additional Factors Info                  Current Medications (10/12/2024):  This is the current hospital active medication list Current Facility-Administered Medications  Medication Dose Route Frequency Provider Last Rate Last Admin   apixaban  (ELIQUIS ) tablet 2.5 mg  2.5 mg Oral BID Patel, Vishal R,  MD   2.5 mg at 10/11/24 2312   atorvastatin  (LIPITOR ) tablet 40 mg  40 mg Oral Daily Patel, Vishal R, MD   40 mg at 10/11/24 1000   Chlorhexidine  Gluconate Cloth 2 % PADS 6 each  6 each Topical Q0600 Collins, Samantha G, PA-C       insulin  aspart (novoLOG ) injection 0-6 Units  0-6 Units Subcutaneous TID WC Patel, Vishal R, MD       Current Outpatient Medications  Medication Sig Dispense Refill    acetaminophen  (TYLENOL ) 325 MG tablet Take 325 mg by mouth daily as needed for mild pain (pain score 1-3) or moderate pain (pain score 4-6).     acetaminophen  (TYLENOL ) 500 MG tablet Take 2 tablets (1,000 mg total) by mouth 4 (four) times daily. (Patient taking differently: Take 1,000 mg by mouth daily as needed for mild pain (pain score 1-3) or moderate pain (pain score 4-6).)     apixaban  (ELIQUIS ) 2.5 MG TABS tablet Take 2.5 mg by mouth 2 (two) times daily.     brimonidine  (ALPHAGAN ) 0.2 % ophthalmic solution Place 1 drop into both eyes 2 (two) times daily.     carvedilol  (COREG ) 12.5 MG tablet Take 12.5 mg by mouth 2 (two) times daily with a meal.     isosorbide  mononitrate (IMDUR ) 30 MG 24 hr tablet Take 30 mg by mouth daily.     latanoprost  (XALATAN ) 0.005 % ophthalmic solution Place 1 drop into both eyes at bedtime.     melatonin 5 MG TABS Take 10 mg by mouth at bedtime as needed (for insomnia).     midodrine  (PROAMATINE ) 2.5 MG tablet Take 2.5 mg by mouth daily as needed (low bp).     Multiple Vitamin (MULTIVITAMIN WITH MINERALS) TABS Take 1 tablet by mouth daily.     NOVOLIN R 100 UNIT/ML injection Inject 0-9 Units into the skin in the morning, at noon, and at bedtime. Per sliding scale     nutrition supplement, JUVEN, (JUVEN) PACK Take 1 packet by mouth 2 (two) times daily between meals. (Patient taking differently: Take 1 packet by mouth 3 (three) times a week.)     zinc  sulfate, 50mg  elemental zinc , 220 (50 Zn) MG capsule Take 1 capsule (220 mg total) by mouth daily.     ACCU-CHEK AVIVA PLUS test strip CHECK BLOOD GLUCOSE THREE TIMES DAILY 300 strip 3   albuterol  (PROVENTIL ) (2.5 MG/3ML) 0.083% nebulizer solution Take 3 mLs (2.5 mg total) by nebulization every 2 (two) hours as needed for wheezing. (Patient not taking: Reported on 10/11/2024)     apixaban  (ELIQUIS ) 5 MG TABS tablet Take 1 tablet (5 mg total) by mouth 2 (two) times daily. (Patient not taking: Reported on 10/11/2024)      atorvastatin  (LIPITOR ) 40 MG tablet Take 1 tablet (40 mg total) by mouth daily. (Patient not taking: Reported on 10/11/2024) 90 tablet 0   calcitRIOL  (ROCALTROL ) 0.25 MCG capsule TAKE 1 CAPSULE (0.25 MCG TOTAL) EVERY MONDAY, WEDNESDAY, AND FRIDAY (Patient not taking: Reported on 10/11/2024) 39 capsule 0   collagenase  (SANTYL ) 250 UNIT/GM ointment Apply topically 2 (two) times daily. (Patient not taking: Reported on 10/11/2024)     Darbepoetin Alfa  (ARANESP ) 200 MCG/0.4ML SOSY injection Inject 0.4 mLs (200 mcg total) into the skin every Sunday at 6pm.     DROPLET INSULIN  SYRINGE 31G X 5/16 0.5 ML MISC USE FOUR TIMES DAILY FOR INJECTIONS 400 each 3   Insulin  Syringes, Disposable, U-100 0.5 ML MISC 4x daily injections 100  each 8   iron  sucrose in sodium chloride  0.9 % 100 mL Inject 100 mg into the vein every Monday, Wednesday, and Friday.     leptospermum manuka honey (MEDIHONEY) PSTE paste Apply 1 Application topically 2 (two) times daily. Apply with wound changes IF santyl  not available. (Patient not taking: Reported on 10/11/2024)     Methoxy PEG-Epoetin Beta (MIRCERA IJ) Inject 150 mcg into the vein every 14 (fourteen) days.     NEEDLE, DISP, 30 G (BD DISP NEEDLES) 30G X 1/2 MISC For 4x daily injections 100 each 13     Discharge Medications: Please see discharge summary for a list of discharge medications.  Relevant Imaging Results:  Relevant Lab Results:   Additional Information SSN [id number], HD MWF [place]  Sheri ONEIDA Sharps, LCSW     "

## 2024-10-12 NOTE — Consult Note (Addendum)
 WOC Nurse wound follow up Wound type: stage 4 pressure injury to sacrum, patient follows with outpatient wound canter Measurement: 3 cm x 3.5 cm x 0.5 cm with 1 cm undermining from 12 o'clock to 6 o'clock  Wound bed: 100 % pink, moist Drainage (amount, consistency, odor) serosanguinous Periwound: intact Dressing procedure/placement/frequency:  Removed old WTD dressing Skin protected to the hip with VAC drape for foam bridge  Filled wound with  __1__ piece of black foam, barrier ring utilized to help achieve seal Sealed NPWT dressing at HG Patient tolerated procedure well     WOC Nurse ostomy follow up Stoma type/location: LLQ colostomy Stomal assessment/size: 1 1/2 inch round, skin level, pink, moist, os at center Peristomal assessment: mild erythema, patient with leaking at time of visit, creasing in abdominal skin  Treatment options for stomal/peristomal skin: 2 inch barrier ring  Output brown stool Ostomy pouching: 2 piece soft convex  skin barrier 2 1/4 inch (WD#  [id number]) pouch (WD # [phone]) barrier ring (WD# [phone]) Education provided: none, patient is independent with ostomy care at home Enrolled patient in [place] Secure Start Discharge program: No- established patient    WOC nurse will continue to provide NPWT dressing changed due to the complexity of the dressing change.  Thank you,  Doyal Polite, MSN, RN, [facility] WOC Team [phone] (Available Mon-Fri 0700-1500)

## 2024-10-12 NOTE — Progress Notes (Addendum)
 Pt receives out-pt HD at [facility] on MWF 10:50 am chair time. Will assist as needed.   Randine Mungo Dialysis Navigator [phone]  Addendum at 4:11 pm: Noted pt left ED. Contacted CSW to confirm pt's disposition so update could be provided to HD clinic. Pt left ED AMA and returned home per CSW. Contacted [facility] to be made aware of the above info and that pt should resume care on Wednesday.

## 2024-10-12 NOTE — ED Notes (Signed)
 Patient called out for nurse, another RN went in the room to ask what she needed. Patient states I'm going home. RN let her know that she can't go home, as she is getting placement. Son was on phone with patient and told the RN over speaker that he would be here in 2 hours to pick her up because he did not like the way we were talking to the patient.

## 2024-10-12 NOTE — Progress Notes (Addendum)
 FL2 updated and SNF ref faxed, bed offers pending.   Update 10:30am- Ins auth approved. [facility] SNF bed ready 1/20

## 2024-10-12 NOTE — ED Notes (Signed)
 Colostomy bag emptied, very little contents.

## 2024-10-12 NOTE — ED Notes (Signed)
 Patient repositioned off of wound on coccyx. Brief Dry.

## 2024-10-13 LAB — HEPATITIS B SURFACE ANTIBODY, QUANTITATIVE: Hep B S AB Quant (Post): 4336 m[IU]/mL

## 2024-10-15 ENCOUNTER — Encounter (HOSPITAL_BASED_OUTPATIENT_CLINIC_OR_DEPARTMENT_OTHER): Admitting: General Surgery

## 2024-10-20 ENCOUNTER — Encounter (HOSPITAL_BASED_OUTPATIENT_CLINIC_OR_DEPARTMENT_OTHER): Admitting: General Surgery

## 2024-10-23 ENCOUNTER — Emergency Department (HOSPITAL_COMMUNITY)

## 2024-10-23 ENCOUNTER — Other Ambulatory Visit: Payer: Self-pay

## 2024-10-23 ENCOUNTER — Emergency Department (HOSPITAL_COMMUNITY)
Admission: EM | Admit: 2024-10-23 | Discharge: 2024-10-23 | Disposition: A | Discharge status: Home / Self Care | Attending: Emergency Medicine | Admitting: Emergency Medicine

## 2024-10-23 DIAGNOSIS — D649 Anemia, unspecified: Secondary | ICD-10-CM

## 2024-10-23 DIAGNOSIS — Z139 Encounter for screening, unspecified: Secondary | ICD-10-CM

## 2024-10-23 LAB — CBC WITH DIFFERENTIAL/PLATELET
Abs Immature Granulocytes: 0.12 10*3/uL — ABNORMAL HIGH (ref 0.00–0.07)
Basophils Absolute: 0.1 10*3/uL (ref 0.0–0.1)
Basophils Relative: 1 %
Eosinophils Absolute: 0 10*3/uL (ref 0.0–0.5)
Eosinophils Relative: 0 %
HCT: 23.6 % — ABNORMAL LOW (ref 36.0–46.0)
Hemoglobin: 7.1 g/dL — ABNORMAL LOW (ref 12.0–15.0)
Immature Granulocytes: 1 %
Lymphocytes Relative: 8 %
Lymphs Abs: 1.2 10*3/uL (ref 0.7–4.0)
MCH: 24.7 pg — ABNORMAL LOW (ref 26.0–34.0)
MCHC: 30.1 g/dL (ref 30.0–36.0)
MCV: 82.2 fL (ref 80.0–100.0)
Monocytes Absolute: 1.5 10*3/uL — ABNORMAL HIGH (ref 0.1–1.0)
Monocytes Relative: 10 %
Neutro Abs: 12.4 10*3/uL — ABNORMAL HIGH (ref 1.7–7.7)
Neutrophils Relative %: 80 %
Platelets: 533 10*3/uL — ABNORMAL HIGH (ref 150–400)
RBC: 2.87 MIL/uL — ABNORMAL LOW (ref 3.87–5.11)
RDW: 20.4 % — ABNORMAL HIGH (ref 11.5–15.5)
WBC: 15.2 10*3/uL — ABNORMAL HIGH (ref 4.0–10.5)
nRBC: 0 % (ref 0.0–0.2)

## 2024-10-23 LAB — BASIC METABOLIC PANEL WITH GFR
Anion gap: 11 (ref 5–15)
BUN: 41 mg/dL — ABNORMAL HIGH (ref 8–23)
CO2: 27 mmol/L (ref 22–32)
Calcium: 8.8 mg/dL — ABNORMAL LOW (ref 8.9–10.3)
Chloride: 95 mmol/L — ABNORMAL LOW (ref 98–111)
Creatinine, Ser: 6.55 mg/dL — ABNORMAL HIGH (ref 0.44–1.00)
GFR, Estimated: 6 mL/min — ABNORMAL LOW
Glucose, Bld: 307 mg/dL — ABNORMAL HIGH (ref 70–99)
Potassium: 3.7 mmol/L (ref 3.5–5.1)
Sodium: 133 mmol/L — ABNORMAL LOW (ref 135–145)

## 2024-10-23 LAB — I-STAT CG4 LACTIC ACID, ED: Lactic Acid, Venous: 1.3 mmol/L (ref 0.5–1.9)

## 2024-10-23 NOTE — ED Notes (Signed)
 Ptar called  pt will be number 18 on the  list

## 2024-10-23 NOTE — Progress Notes (Signed)
 WOC has been consulted. Wound vac is active, operating without error messages. Ostomy supplies at bedside.

## 2024-10-23 NOTE — Consult Note (Addendum)
" °  CLINICAL SUPPORT TEAM - WOUND OSTOMY AND CONTINENCE TEAM  CONSULTATION SERVICES   WOC Nurse-Inpatient Note   WOC Nurse wound follow up Wound type: stage 4 pressure injury to sacrum, patient follows with outpatient wound canter Measurement: 3 cm x 4 cm x 0.5 cm with 1 cm undermining from 12 o'clock to 6 o'clock  Wound bed: 100 % pink, moist Drainage (amount, consistency, odor) Moderate amount, serosanguinous. Periwound: intact Dressing procedure/placement/frequency:  Removed old WTD dressing Skin protected with barrier skin prep and an ostomy pouch extender TI#[id number]. Filled wound with  __1__ piece of black foam, barrier ring utilized to help achieve seal Sealed NPWT dressing at HG Patient tolerated procedure well.  Used medium VAC kit TI#[id number]. Attached the dressing at home [facility] machine.  Addendum: If the patient was admitted, please call portable to request a hospital VAC and plug this dressing on, and canister for the hospital machine [phone].   WOC Nurse ostomy follow up. Patient well known to [facility] team from previous consults; has been educated and is independent in care  Stoma type/location: LLQ colostomy. Pouch in place, no signs of leaking. Treatment options for stomal/peristomal skin: 5 barrier rings 2 inch [employer] [phone], 1 box of extenders [employer] [phone].  Output: bed nurse emptied. Ostomy pouching: 1pc. convex 2 1/8 [employer] number#[id number].  Enrolled patient in [place] Secure Start Discharge program: Yes previously.  WOC team will follow TUE if inpatient. Please reconsult if further assistance is needed. Thank-you,  Lela Holm MSN, RN, CWCN, CNS.  (Phone [phone])   "

## 2024-10-23 NOTE — ED Notes (Signed)
 Son arrived, will take patient home via [employer]. AVS reviewed with patient and son, patient assisted into wheelchair, taken to lobby to wait for [employer] with son.

## 2024-10-23 NOTE — ED Provider Notes (Signed)
 "  Emergency Department Provider Note   I have reviewed the triage vital signs and the nursing notes.   HISTORY  Chief Complaint No chief complaint on file.   HPI [name] is a 69 y.o. female with past history reviewed below including ESRD, diabetes, hyperlipidemia, hypertension, sacral wound, and left foot wound presents to the emergency department after being called from her PCP office at [facility] saying that her labs are showing sign of a blood infection and that she should present to the emergency department.  Her last dialysis session was Wednesday.  She has been feeling well.  No fevers or chills.  No abdominal pain, vomiting, diarrhea.  Her wound VAC on her buttock/sacral wound has been disconnected for the past week after it fell off.  She has home health but she states they come when they want to and hasn't had anyone to replace the wound vac. She reports feeling well at this time.    Past Medical History:  Diagnosis Date   Anemia    Arthritis    Atrial fibrillation (HCC)    CAD (coronary artery disease)    Constipation    Diabetes mellitus (HCC)    ESRD on hemodialysis (HCC)    History of blood transfusion    HLD (hyperlipidemia)    Hypertension    Myocardial infarction [facility])    in April 2014   Septic shock (HCC) 04/19/2024   Wound infection after surgery 03/17/2013    Review of Systems  Constitutional: No fever/chills Cardiovascular: Denies chest pain. Respiratory: Denies shortness of breath. Gastrointestinal: No abdominal pain.  No nausea, no vomiting.   Musculoskeletal: Negative for back pain. Skin: Known sacral and foot wounds.  Neurological: Negative for headaches.   ____________________________________________   PHYSICAL EXAM:  VITAL SIGNS: ED Triage Vitals  Encounter Vitals Group     BP 10/23/24 1315 130/64     Pulse Rate 10/23/24 1315 72     Resp 10/23/24 1315 17     Temp 10/23/24 1315 98.2 F (36.8 C)     Temp Source  10/23/24 1315 Oral     SpO2 10/23/24 1315 100 %     Weight 10/23/24 1316 140 lb (63.5 kg)     Height 10/23/24 1316 5' 7 (1.702 m)   Constitutional: Alert and oriented. Well appearing and in no acute distress. Eyes: Conjunctivae are normal.  Head: Atraumatic. Nose: No congestion/rhinnorhea. Mouth/Throat: Mucous membranes are moist.   Neck: No stridor.  Cardiovascular: Normal rate, regular rhythm. Good peripheral circulation. Grossly normal heart sounds.   Respiratory: Normal respiratory effort.  Gastrointestinal: Soft and nontender. No distention.  Musculoskeletal: No gross deformities of extremities. LLE wound wrapped, clean and dry.  Neurologic:  Normal speech and language.  ____________________________________________   LABS (all labs ordered are listed, but only abnormal results are displayed)  Labs Reviewed  CBC WITH DIFFERENTIAL/PLATELET - Abnormal; Notable for the following components:      Result Value   WBC 15.2 (*)    RBC 2.87 (*)    Hemoglobin 7.1 (*)    HCT 23.6 (*)    MCH 24.7 (*)    RDW 20.4 (*)    Platelets 533 (*)    Neutro Abs 12.4 (*)    Monocytes Absolute 1.5 (*)    Abs Immature Granulocytes 0.12 (*)    All other components within normal limits  BASIC METABOLIC PANEL WITH GFR - Abnormal; Notable for the following components:   Sodium 133 (*)  Chloride 95 (*)    Glucose, Bld 307 (*)    BUN 41 (*)    Creatinine, Ser 6.55 (*)    Calcium  8.8 (*)    GFR, Estimated 6 (*)    All other components within normal limits  CULTURE, BLOOD (ROUTINE X 2)  CULTURE, BLOOD (ROUTINE X 2)  I-STAT CG4 LACTIC ACID, ED    ____________________________________________   PROCEDURES  Procedure(s) performed:   Procedures  None ____________________________________________   INITIAL IMPRESSION / ASSESSMENT AND PLAN / ED COURSE  Pertinent labs & imaging results that were available during my care of the patient were reviewed by me and considered in my medical  decision making (see chart for details).   This patient is Presenting for Evaluation of abnormal lab, which does require a range of treatment options, and is a complaint that involves a moderate risk of morbidity and mortality.  The Differential Diagnoses include lab error, leukocytosis, AKI, K abnormality, abnormal blood cultures, etc.  I decided to review pertinent External Data, and in summary I am unable to find lab results in [facility] Everywhere.   Clinical Laboratory Tests Ordered, included mild leukocytosis to 15.2.  Hemoglobin of 7.1 with no symptoms.  Likely anemia of chronic disease.  No active bleeding.  Creatinine near baseline.  Potassium normal.  Radiologic Tests Ordered, included foot XR. I independently interpreted the images and agree with radiology interpretation.   Medical Decision Making: Summary:  Patient presents asymptomatic with report of abnormal blood test but she is unsure exactly which type.  She has known foot and sacral wounds.  She has ESRD and known anemia.  Borderline requiring blood transfusion but vital signs are unremarkable.  She is not having any symptoms.  Mild leukocytosis consistent with prior values.  No lab markers concerning for sepsis or SIRS vitals.  Reevaluation with update and discussion with patient.  Plan for discharge home with close follow-up with her PCP and return with any fevers.  I have sent blood cultures here. Remains asymptomatic and eager for discharge.   Patient's presentation is most consistent with acute presentation with potential threat to life or bodily function.   Disposition: discharge  ____________________________________________  FINAL CLINICAL IMPRESSION(S) / ED DIAGNOSES  Final diagnoses:  Encounter for medical screening examination  Anemia, unspecified type    Note:  This document was prepared using Dragon voice recognition software and may include unintentional dictation errors.  Fonda Law, MD,  [facility]    Khadeeja Elden, Fonda MATSU, MD 10/29/24 [phone]  "

## 2024-10-23 NOTE — ED Triage Notes (Signed)
 Pt BIB [facility] from home. Pt went to PCP and they said she has a blood infection. Missed dialysis today. Had Wed full session. Wound vac to buttocks that has been disconnected for one week. New wound to left foot that drained when putting sock on. Pt legally blind.  EMS VS 104/50 HR 70 98 RA 390 CBG 98.9 Temp

## 2024-10-23 NOTE — Discharge Instructions (Signed)
 We found no evidence of infection in your blood work today.  We are sending blood cultures and we will call you if these come back positive.  If you develop fever, chills, other sudden/severe symptoms you should return to the emergency department for reevaluation.  Please make your next dialysis session.

## 2024-10-27 ENCOUNTER — Inpatient Hospital Stay (HOSPITAL_COMMUNITY)
Admission: EM | Admit: 2024-10-27 | Source: Home / Self Care | Attending: Internal Medicine | Admitting: Internal Medicine

## 2024-10-27 ENCOUNTER — Encounter (HOSPITAL_COMMUNITY): Payer: Self-pay

## 2024-10-27 ENCOUNTER — Other Ambulatory Visit: Payer: Self-pay

## 2024-10-27 DIAGNOSIS — N186 End stage renal disease: Secondary | ICD-10-CM

## 2024-10-27 DIAGNOSIS — L89154 Pressure ulcer of sacral region, stage 4: Secondary | ICD-10-CM

## 2024-10-27 DIAGNOSIS — D638 Anemia in other chronic diseases classified elsewhere: Secondary | ICD-10-CM | POA: Insufficient documentation

## 2024-10-27 DIAGNOSIS — I480 Paroxysmal atrial fibrillation: Secondary | ICD-10-CM | POA: Insufficient documentation

## 2024-10-27 DIAGNOSIS — L8990 Pressure ulcer of unspecified site, unspecified stage: Secondary | ICD-10-CM

## 2024-10-27 DIAGNOSIS — D75839 Thrombocytosis, unspecified: Secondary | ICD-10-CM

## 2024-10-27 DIAGNOSIS — L03116 Cellulitis of left lower limb: Secondary | ICD-10-CM

## 2024-10-27 DIAGNOSIS — D649 Anemia, unspecified: Principal | ICD-10-CM | POA: Diagnosis present

## 2024-10-27 DIAGNOSIS — Z8673 Personal history of transient ischemic attack (TIA), and cerebral infarction without residual deficits: Secondary | ICD-10-CM

## 2024-10-27 DIAGNOSIS — Z89511 Acquired absence of right leg below knee: Secondary | ICD-10-CM

## 2024-10-27 HISTORY — DX: Dependence on renal dialysis: Z99.2

## 2024-10-27 NOTE — ED Triage Notes (Signed)
 Pt BIB [facility] from home with the original complaint of shob. Pt stated she started to feel anxious when thinking about a upcoming surgery she might have, then the shob started. Pt verbalized she is no longer anxious or shob at this time, but would like to be dialyzed because she missed Monday. Pt also has a wound on her left foot that she would like taken a look at.   Hx: dialysis M,W,F  Pt is legally blind   EMS vitals  112/50  78 HR  96% sp02 room air   269 cbg

## 2024-10-28 ENCOUNTER — Emergency Department (HOSPITAL_COMMUNITY)

## 2024-10-28 ENCOUNTER — Encounter (HOSPITAL_COMMUNITY): Payer: Self-pay | Admitting: Internal Medicine

## 2024-10-28 DIAGNOSIS — D75839 Thrombocytosis, unspecified: Secondary | ICD-10-CM

## 2024-10-28 DIAGNOSIS — Z8673 Personal history of transient ischemic attack (TIA), and cerebral infarction without residual deficits: Secondary | ICD-10-CM | POA: Diagnosis not present

## 2024-10-28 DIAGNOSIS — Z89511 Acquired absence of right leg below knee: Secondary | ICD-10-CM

## 2024-10-28 DIAGNOSIS — L89154 Pressure ulcer of sacral region, stage 4: Secondary | ICD-10-CM

## 2024-10-28 DIAGNOSIS — Z992 Dependence on renal dialysis: Secondary | ICD-10-CM

## 2024-10-28 DIAGNOSIS — L03116 Cellulitis of left lower limb: Secondary | ICD-10-CM | POA: Diagnosis not present

## 2024-10-28 DIAGNOSIS — N186 End stage renal disease: Secondary | ICD-10-CM

## 2024-10-28 DIAGNOSIS — L8990 Pressure ulcer of unspecified site, unspecified stage: Secondary | ICD-10-CM

## 2024-10-28 DIAGNOSIS — I480 Paroxysmal atrial fibrillation: Secondary | ICD-10-CM | POA: Diagnosis not present

## 2024-10-28 DIAGNOSIS — D638 Anemia in other chronic diseases classified elsewhere: Secondary | ICD-10-CM | POA: Diagnosis not present

## 2024-10-28 DIAGNOSIS — D649 Anemia, unspecified: Secondary | ICD-10-CM

## 2024-10-28 LAB — CBG MONITORING, ED
Glucose-Capillary: 252 mg/dL — ABNORMAL HIGH (ref 70–99)
Glucose-Capillary: 338 mg/dL — ABNORMAL HIGH (ref 70–99)

## 2024-10-28 LAB — CBC WITH DIFFERENTIAL/PLATELET
Abs Immature Granulocytes: 0.24 10*3/uL — ABNORMAL HIGH (ref 0.00–0.07)
Basophils Absolute: 0.1 10*3/uL (ref 0.0–0.1)
Basophils Relative: 1 %
Eosinophils Absolute: 0.1 10*3/uL (ref 0.0–0.5)
Eosinophils Relative: 0 %
HCT: 21.3 % — ABNORMAL LOW (ref 36.0–46.0)
Hemoglobin: 6.6 g/dL — CL (ref 12.0–15.0)
Immature Granulocytes: 1 %
Lymphocytes Relative: 6 %
Lymphs Abs: 1.2 10*3/uL (ref 0.7–4.0)
MCH: 25.5 pg — ABNORMAL LOW (ref 26.0–34.0)
MCHC: 31 g/dL (ref 30.0–36.0)
MCV: 82.2 fL (ref 80.0–100.0)
Monocytes Absolute: 1.2 10*3/uL — ABNORMAL HIGH (ref 0.1–1.0)
Monocytes Relative: 6 %
Neutro Abs: 18.1 10*3/uL — ABNORMAL HIGH (ref 1.7–7.7)
Neutrophils Relative %: 86 %
Platelets: 736 10*3/uL — ABNORMAL HIGH (ref 150–400)
RBC: 2.59 MIL/uL — ABNORMAL LOW (ref 3.87–5.11)
RDW: 21.2 % — ABNORMAL HIGH (ref 11.5–15.5)
Smear Review: NORMAL
WBC: 21 10*3/uL — ABNORMAL HIGH (ref 4.0–10.5)
nRBC: 0 % (ref 0.0–0.2)

## 2024-10-28 LAB — CULTURE, BLOOD (ROUTINE X 2)
Culture: NO GROWTH
Culture: NO GROWTH

## 2024-10-28 LAB — POC OCCULT BLOOD, ED: Fecal Occult Bld: NEGATIVE

## 2024-10-28 LAB — BASIC METABOLIC PANEL WITH GFR
Anion gap: 17 — ABNORMAL HIGH (ref 5–15)
BUN: 66 mg/dL — ABNORMAL HIGH (ref 8–23)
CO2: 20 mmol/L — ABNORMAL LOW (ref 22–32)
Calcium: 8.9 mg/dL (ref 8.9–10.3)
Chloride: 94 mmol/L — ABNORMAL LOW (ref 98–111)
Creatinine, Ser: 9.93 mg/dL — ABNORMAL HIGH (ref 0.44–1.00)
GFR, Estimated: 4 mL/min — ABNORMAL LOW
Glucose, Bld: 232 mg/dL — ABNORMAL HIGH (ref 70–99)
Potassium: 4.8 mmol/L (ref 3.5–5.1)
Sodium: 130 mmol/L — ABNORMAL LOW (ref 135–145)

## 2024-10-28 LAB — GLUCOSE, CAPILLARY
Glucose-Capillary: 264 mg/dL — ABNORMAL HIGH (ref 70–99)
Glucose-Capillary: 247 mg/dL — ABNORMAL HIGH (ref 70–99)

## 2024-10-28 LAB — PREPARE RBC (CROSSMATCH)

## 2024-10-28 MED ORDER — ACETAMINOPHEN 650 MG RE SUPP: 650.0000 mg | Freq: Four times a day (QID) | RECTAL | Status: AC | PRN

## 2024-10-28 MED ORDER — ALTEPLASE 2 MG IJ SOLR
2.0000 mg | Freq: Once | INTRAMUSCULAR | Status: AC
  Administered 2024-10-28: 2 mg

## 2024-10-28 MED ORDER — SODIUM CHLORIDE 0.9% IV SOLUTION: Freq: Once | INTRAVENOUS | Status: AC

## 2024-10-28 MED ORDER — SODIUM CHLORIDE 0.9% FLUSH
3.0000 mL | Freq: Two times a day (BID) | INTRAVENOUS | Status: AC
  Administered 2024-10-28 – 2024-10-30 (×5): 3 mL via INTRAVENOUS

## 2024-10-28 MED ORDER — PIPERACILLIN-TAZOBACTAM IN DEX 2-0.25 GM/50ML IV SOLN
2.2500 g | Freq: Three times a day (TID) | INTRAVENOUS | Status: AC
  Administered 2024-10-28 – 2024-10-30 (×8): 2.25 g via INTRAVENOUS
  Filled 2024-10-28 (×11): qty 50

## 2024-10-28 MED ORDER — MIDODRINE HCL 5 MG PO TABS: 2.5000 mg | ORAL_TABLET | Freq: Every day | ORAL | Status: AC | PRN

## 2024-10-28 MED ORDER — INSULIN ASPART 100 UNIT/ML IJ SOLN
0.0000 [IU] | Freq: Three times a day (TID) | INTRAMUSCULAR | Status: AC
  Administered 2024-10-28: 5 [IU] via SUBCUTANEOUS
  Administered 2024-10-28: 7 [IU] via SUBCUTANEOUS
  Administered 2024-10-28: 5 [IU] via SUBCUTANEOUS
  Administered 2024-10-29 (×2): 3 [IU] via SUBCUTANEOUS
  Administered 2024-10-29: 1 [IU] via SUBCUTANEOUS
  Administered 2024-10-30: 3 [IU] via SUBCUTANEOUS
  Administered 2024-10-30: 7 [IU] via SUBCUTANEOUS
  Administered 2024-10-30: 5 [IU] via SUBCUTANEOUS
  Filled 2024-10-28: qty 1
  Filled 2024-10-28: qty 3
  Filled 2024-10-28 (×2): qty 5
  Filled 2024-10-28: qty 7
  Filled 2024-10-28 (×2): qty 3
  Filled 2024-10-28: qty 5
  Filled 2024-10-28: qty 7

## 2024-10-28 MED ORDER — SODIUM BICARBONATE 650 MG PO TABS
650.0000 mg | ORAL_TABLET | Freq: Two times a day (BID) | ORAL | Status: AC
  Administered 2024-10-28 – 2024-10-30 (×6): 650 mg via ORAL
  Filled 2024-10-28 (×6): qty 1

## 2024-10-28 MED ORDER — CEFAZOLIN SODIUM-DEXTROSE 2-4 GM/100ML-% IV SOLN
2.0000 g | Freq: Once | INTRAVENOUS | Status: AC
  Administered 2024-10-28: 2 g via INTRAVENOUS
  Filled 2024-10-28: qty 100

## 2024-10-28 MED ORDER — HEPARIN SODIUM (PORCINE) 1000 UNIT/ML DIALYSIS
40.0000 [IU]/kg | INTRAMUSCULAR | Status: DC | PRN
  Administered 2024-10-28: 2500 [IU] via INTRAVENOUS_CENTRAL

## 2024-10-28 MED ORDER — ALTEPLASE 2 MG IJ SOLR
2.0000 mg | Freq: Once | INTRAMUSCULAR | Status: AC | PRN
  Administered 2024-10-28: 2 mg

## 2024-10-28 MED ORDER — LINEZOLID 600 MG/300ML IV SOLN
600.0000 mg | Freq: Two times a day (BID) | INTRAVENOUS | Status: AC
  Administered 2024-10-28 – 2024-10-30 (×6): 600 mg via INTRAVENOUS
  Filled 2024-10-28 (×6): qty 300

## 2024-10-28 MED ORDER — ONDANSETRON HCL 4 MG PO TABS: 4.0000 mg | ORAL_TABLET | Freq: Four times a day (QID) | ORAL | Status: AC | PRN

## 2024-10-28 MED ORDER — SILVER SULFADIAZINE 1 % EX CREA
TOPICAL_CREAM | Freq: Two times a day (BID) | CUTANEOUS | Status: AC
  Filled 2024-10-28 (×2): qty 85

## 2024-10-28 MED ORDER — ISOSORBIDE MONONITRATE ER 30 MG PO TB24
30.0000 mg | ORAL_TABLET | Freq: Every day | ORAL | Status: AC
  Administered 2024-10-28 – 2024-10-30 (×3): 30 mg via ORAL
  Filled 2024-10-28 (×3): qty 1

## 2024-10-28 MED ORDER — ACETAMINOPHEN 325 MG PO TABS
650.0000 mg | ORAL_TABLET | Freq: Four times a day (QID) | ORAL | Status: AC | PRN
  Administered 2024-10-29: 650 mg via ORAL
  Filled 2024-10-28: qty 2

## 2024-10-28 MED ORDER — ATORVASTATIN CALCIUM 40 MG PO TABS
40.0000 mg | ORAL_TABLET | Freq: Every day | ORAL | Status: AC
  Administered 2024-10-28 – 2024-10-30 (×3): 40 mg via ORAL
  Filled 2024-10-28 (×4): qty 1

## 2024-10-28 MED ORDER — HEPARIN SODIUM (PORCINE) 1000 UNIT/ML DIALYSIS: 2000.0000 [IU] | INTRAMUSCULAR | Status: DC | PRN

## 2024-10-28 MED ORDER — MELATONIN 5 MG PO TABS
10.0000 mg | ORAL_TABLET | Freq: Every evening | ORAL | Status: AC | PRN
  Administered 2024-10-28: 10 mg via ORAL
  Filled 2024-10-28: qty 2

## 2024-10-28 MED ORDER — COLLAGENASE 250 UNIT/GM EX OINT: TOPICAL_OINTMENT | Freq: Two times a day (BID) | CUTANEOUS | Status: DC

## 2024-10-28 MED ORDER — CHLORHEXIDINE GLUCONATE CLOTH 2 % EX PADS
6.0000 | MEDICATED_PAD | Freq: Every day | CUTANEOUS | Status: DC
  Administered 2024-10-29 – 2024-10-30 (×2): 6 via TOPICAL

## 2024-10-28 MED ORDER — INSULIN ASPART 100 UNIT/ML IJ SOLN
0.0000 [IU] | Freq: Every day | INTRAMUSCULAR | Status: AC
  Administered 2024-10-28 – 2024-10-30 (×3): 2 [IU] via SUBCUTANEOUS
  Filled 2024-10-28: qty 1

## 2024-10-28 MED ORDER — CARVEDILOL 12.5 MG PO TABS
12.5000 mg | ORAL_TABLET | Freq: Two times a day (BID) | ORAL | Status: DC
  Administered 2024-10-28: 12.5 mg via ORAL
  Filled 2024-10-28: qty 1

## 2024-10-28 MED ORDER — ONDANSETRON HCL 4 MG/2ML IJ SOLN: 4.0000 mg | Freq: Four times a day (QID) | INTRAMUSCULAR | Status: AC | PRN

## 2024-10-28 MED ORDER — SODIUM CHLORIDE 0.9% FLUSH: 3.0000 mL | INTRAVENOUS | Status: AC | PRN

## 2024-10-28 MED ORDER — SODIUM CHLORIDE 0.9 % IV SOLN: 250.0000 mL | INTRAVENOUS | Status: AC | PRN

## 2024-10-28 MED ORDER — LATANOPROST 0.005 % OP SOLN
1.0000 [drp] | Freq: Every day | OPHTHALMIC | Status: AC
  Administered 2024-10-28 – 2024-10-30 (×3): 1 [drp] via OPHTHALMIC
  Filled 2024-10-28: qty 2.5

## 2024-10-28 NOTE — ED Provider Triage Note (Signed)
 Emergency Medicine Provider Triage Evaluation Note  [name] , a 69 y.o. female  was evaluated in triage.  Pt complains of left foot wound/possible infection.  Review of Systems  Positive:  Negative:   Physical Exam  BP (!) 100/55 (BP Location: Left Arm)   Pulse 74   Temp 98.7 F (37.1 C) (Oral)   Resp 16   Ht 5' 7 (1.702 m)   Wt 63.5 kg   SpO2 100%   BMI 21.93 kg/m  Gen:   Awake, no distress   Resp:  Normal effort  MSK:   Moves extremities without difficulty  Other:    Medical Decision Making  Medically screening exam initiated at 12:17 AM.  Appropriate orders placed.  [name] was informed that the remainder of the evaluation will be completed by another provider, this initial triage assessment does not replace that evaluation, and the importance of remaining in the ED until their evaluation is complete.     Logan Ubaldo NOVAK, PA-C 10/28/24 [phone]

## 2024-10-28 NOTE — ED Notes (Signed)
 Phlebotomy stuck patient twice to collect second set of blood culture. Unable to obtain to culture. RN and MD aware

## 2024-10-28 NOTE — H&P (Addendum)
 " History and Physical    [name] FMW:[id number] DOB: August 21, 1956 DOA: 10/27/2024  PCP: Campbell Reynolds, NP   Patient coming from: Home   Chief Complaint:  Chief Complaint  Patient presents with   left foot wound   ED TRIAGE note:Pt BIB [facility] from home with the original complaint of shob. Pt stated she started to feel anxious when thinking about a upcoming surgery she might have, then the shob started. Pt verbalized she is no longer anxious or shob at this time, but would like to be dialyzed because she missed Monday. Pt also has a wound on her left foot that she would like taken a look at.    Hx: dialysis M,W,F  Pt is legally blind    EMS vitals  112/50  78 HR  96% sp02 room air    269 cbg   HPI:  [name] is a 69 y.o. 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 Patient also endorsing needs dialysis on Monday due to adverse weather. Patient supposed to have wound VAC placement of the sacral area however stated that battery is dead and she does not have power at home.   Also complaining about concern for possible infection of the left lower extremity. Of note patient was seen emergency department multiple times plan to place into [facility] skilled nursing but patient eventually left AMA. She states that she wishes to go to a facility at this time in a state of discharging to home.  Patient is endorsing exertional shortness of breath. she denies fever, chill, cough,  heartburn, nausea, vomiting, abdominal pain, dizziness, headache,.  She is also endorsing back pain.  Denies any lower extremity pain.   ED Course:  At presentation to ED patient is hemodynamically stable.  Afebrile.  CBC showing elevated WBC count 21, low hemoglobin 6.6 baseline hemoglobin around 7-9 and elevated platelet count 761. See patient low sodium 130, elevated blood glucose 232, low bicarb, elevated anion gap 17 and evidence of ESRD. FOBT negative. X-ray of the left foot: IMPRESSION: 1. Improved soft tissue swelling of the forefoot since prior study. 2. No acute osseous findings.  In the ED 1 unit of blood transfusion has been ordered and received IV cefazolin .  Hospitalist consulted for further evaluation management of left lower extremity cellulitis, acute on chronic anemia, thrombocytosis, wound VAC placement of the sacral wound and volume overload with need for dialysis.   Significant labs in the ED: Lab Orders         Culture, blood (Routine X 2) w Reflex to ID Panel         Basic metabolic panel         CBC with Differential         CBC         Comprehensive metabolic panel         POC occult blood, ED RN will collect         POC occult blood, ED       Review of Systems:  Review of Systems  Constitutional:  Negative for chills, fever, malaise/fatigue  and weight loss.  Respiratory:  Positive for shortness of breath. Negative for cough, sputum production and wheezing.   Cardiovascular:  Negative for chest pain, palpitations and leg swelling.  Gastrointestinal:  Negative for abdominal pain, heartburn, nausea and vomiting.  Musculoskeletal:  Negative for back pain.       Left foot wound and concern for infection  Neurological:  Negative for dizziness and headaches.  Psychiatric/Behavioral:  The patient is nervous/anxious.     Past Medical History:  Diagnosis Date   Anemia    Arthritis    Atrial fibrillation (HCC)    CAD (coronary artery disease)    Chronic  kidney disease, stage III (moderate) (HCC)    Constipation    Diabetes mellitus (HCC)    ESRD on dialysis (HCC) 04/20/2024   History of blood transfusion    HLD (hyperlipidemia)    Hypertension    Myocardial infarction [facility])    in April 2014   Septic shock (HCC) 04/19/2024   Wound infection after surgery 03/17/2013    Past Surgical History:  Procedure Laterality Date   AMPUTATION Right 04/20/2024   Procedure: AMPUTATION BELOW KNEE;  Surgeon: Harden Jerona GAILS, MD;  Location: [facility] OR;  Service: Orthopedics;  Laterality: Right;   ANKLE SURGERY Right    x 6 total   BREAST BIOPSY Left    15 or 20 yearsa ago she cant remember laterality    HARDWARE REMOVAL Right 02/13/2013   Procedure: HARDWARE REMOVAL;  Surgeon: Jerona GAILS Harden, MD;  Location: [facility] OR;  Service: Orthopedics;  Laterality: Right;  Right Ankle Removal Hardware, Debridement, Place Wound VAC   IR FLUORO GUIDE CV LINE LEFT  11/08/2023   IR FLUORO GUIDE CV LINE LEFT  04/23/2024   IR FLUORO GUIDE CV LINE RIGHT  11/01/2023   IR FLUORO GUIDE CV LINE RIGHT  11/12/2023   IR REMOVAL TUN CV CATH W/O FL  04/21/2024   IR US  GUIDE VASC ACCESS LEFT  11/08/2023   IR US  GUIDE VASC ACCESS LEFT  04/23/2024   IR US  GUIDE VASC ACCESS RIGHT  11/01/2023   IR US  GUIDE VASC ACCESS RIGHT  11/12/2023   laser surgery for diabetic retinopathy Bilateral    LEFT HEART CATHETERIZATION WITH CORONARY ANGIOGRAM N/A 01/09/2013   Procedure: LEFT HEART CATHETERIZATION WITH CORONARY ANGIOGRAM;  Surgeon: Toribio JONELLE Fuel, MD;  Location: [facility] CATH LAB;  Service: Cardiovascular;  Laterality: N/A;   ORIF ANKLE FRACTURE Right 01/12/2013   Procedure: OPEN REDUCTION INTERNAL FIXATION (ORIF) ANKLE FRACTURE;  Surgeon: Jerona GAILS Harden, MD;  Location: [facility] OR;  Service: Orthopedics;  Laterality: Right;   PARTIAL COLECTOMY N/A 11/02/2023   Procedure: OPEN PARTIAL COLECTOMY WITH END COLOSTOMY;  Surgeon: Dasie Leonor CROME, MD;  Location: [facility] OR;  Service: General;  Laterality: N/A;   PARTIAL  HYSTERECTOMY     REVISION AMPUTATION, BELOW THE KNEE Right 06/24/2024   Procedure: REVISION AMPUTATION, BELOW THE KNEE;  Surgeon: Harden Jerona GAILS, MD;  Location: [facility] OR;  Service: Orthopedics;  Laterality: Right;   TUNNELLED CATHETER EXCHANGE N/A 04/15/2024   Procedure: TUNNELLED CATHETER EXCHANGE;  Surgeon: Pearline Norman RAMAN, MD;  Location: [facility] LAB;  Service: Cardiovascular;  Laterality: N/A;   TUNNELLED CATHETER EXCHANGE N/A 07/23/2024   Procedure: TUNNELLED CATHETER EXCHANGE;  Surgeon: Serene Gaile ORN, MD;  Location: [facility] LAB;  Service: Cardiovascular;  Laterality: N/A;     reports that she has never smoked. She has never used smokeless tobacco. She reports that she does not drink  alcohol  and does not use drugs.  Allergies[1]  Family History  Problem Relation Age of Onset   Diabetes Mother        No history CAD   Colon cancer Mother 51       died at 80 from CRC   Hypertension Father        Also had CAD   Heart disease Father    Cervical cancer Sister    Diabetes Sister    Congestive Heart Failure Sister    Heart disease Maternal Grandmother    Diabetes Brother    Kidney disease Brother    Sickle cell anemia Son    Thyroid  disease Son    Breast cancer Neg Hx    BRCA 1/2 Neg Hx     Prior to Admission medications  Medication Sig Start Date End Date Taking? Authorizing Provider  ACCU-CHEK AVIVA PLUS test strip CHECK BLOOD GLUCOSE THREE TIMES DAILY 11/05/22   Joshua Domino, DO  acetaminophen  (TYLENOL ) 325 MG tablet Take 325 mg by mouth daily as needed for mild pain (pain score 1-3) or moderate pain (pain score 4-6).    [provider]  acetaminophen  (TYLENOL ) 500 MG tablet Take 2 tablets (1,000 mg total) by mouth 4 (four) times daily. Patient taking differently: Take 1,000 mg by mouth daily as needed for mild pain (pain score 1-3) or moderate pain (pain score 4-6). 06/09/24   Myrna Bitters, DO  albuterol  (PROVENTIL ) (2.5 MG/3ML) 0.083% nebulizer solution Take 3 mLs (2.5 mg  total) by nebulization every 2 (two) hours as needed for wheezing. Patient not taking: Reported on 10/11/2024 11/25/23   Raenelle Donalda HERO, MD  apixaban  (ELIQUIS ) 2.5 MG TABS tablet Take 2.5 mg by mouth 2 (two) times daily.    [provider]  apixaban  (ELIQUIS ) 5 MG TABS tablet Take 1 tablet (5 mg total) by mouth 2 (two) times daily. Patient not taking: Reported on 10/11/2024 06/09/24   Myrna Bitters, DO  atorvastatin  (LIPITOR ) 40 MG tablet Take 1 tablet (40 mg total) by mouth daily. Patient not taking: Reported on 10/11/2024 10/06/24   Lue Elsie BROCKS, MD  brimonidine  (ALPHAGAN ) 0.2 % ophthalmic solution Place 1 drop into both eyes 2 (two) times daily.    [provider]  calcitRIOL  (ROCALTROL ) 0.25 MCG capsule TAKE 1 CAPSULE (0.25 MCG TOTAL) EVERY MONDAY, WEDNESDAY, AND FRIDAY Patient not taking: Reported on 10/11/2024 06/22/22   Christia Budds, MD  carvedilol  (COREG ) 12.5 MG tablet Take 12.5 mg by mouth 2 (two) times daily with a meal.    [provider]  collagenase  (SANTYL ) 250 UNIT/GM ointment Apply topically 2 (two) times daily. Patient not taking: Reported on 10/11/2024 06/09/24   King, Olivia, DO  Darbepoetin Alfa  (ARANESP ) 200 MCG/0.4ML SOSY injection Inject 0.4 mLs (200 mcg total) into the skin every Sunday at 6pm. 06/14/24   Myrna Bitters, DO  DROPLET INSULIN  SYRINGE 31G X 5/16 0.5 ML MISC USE FOUR TIMES DAILY FOR INJECTIONS 09/03/22   Joshua Domino, DO  Insulin  Syringes, Disposable, U-100 0.5 ML MISC 4x daily injections 03/21/18   Riccio, Angela C, DO  isosorbide  mononitrate (IMDUR ) 30 MG 24 hr tablet Take 30 mg by mouth daily.    [provider]  latanoprost  (XALATAN ) 0.005 % ophthalmic solution Place 1 drop into both eyes at bedtime.    [provider]  leptospermum manuka honey (MEDIHONEY) PSTE paste Apply 1 Application topically 2 (two) times daily. Apply with wound changes IF santyl  not available. Patient not taking: Reported  on 10/11/2024  06/09/24   King, Olivia, DO  melatonin 5 MG TABS Take 10 mg by mouth at bedtime as needed (for insomnia).    [provider]  Methoxy PEG-Epoetin Beta (MIRCERA IJ) Inject 150 mcg into the vein every 14 (fourteen) days. 06/12/24 10/08/25  [provider]  midodrine  (PROAMATINE ) 2.5 MG tablet Take 2.5 mg by mouth daily as needed (low bp).    [provider]  Multiple Vitamin (MULTIVITAMIN WITH MINERALS) TABS Take 1 tablet by mouth daily.    [provider]  NEEDLE, DISP, 30 G (BD DISP NEEDLES) 30G X 1/2 MISC For 4x daily injections 11/05/15   Phelps, Jazma Y, DO  NOVOLIN R 100 UNIT/ML injection Inject 0-9 Units into the skin in the morning, at noon, and at bedtime. Per sliding scale 04/03/24   [provider]  nutrition supplement, JUVEN, (JUVEN) PACK Take 1 packet by mouth 2 (two) times daily between meals. Patient taking differently: Take 1 packet by mouth 3 (three) times a week. 06/09/24   King, Olivia, DO  zinc  sulfate, 50mg  elemental zinc , 220 (50 Zn) MG capsule Take 1 capsule (220 mg total) by mouth daily. 06/10/24   Myrna Bitters, DO     Physical Exam: Vitals:   10/28/24 0145 10/28/24 0200 10/28/24 0242 10/28/24 0414  BP: 126/60 119/68 130/78   Pulse: 79 77 79   Resp:   16   Temp:    98.8 F (37.1 C)  TempSrc:    Oral  SpO2: 94% 97% 100%   Weight:      Height:        Physical Exam Vitals and nursing note reviewed.  Constitutional:      Appearance: She is ill-appearing.  HENT:     Mouth/Throat:     Mouth: Mucous membranes are moist.  Eyes:     Pupils: Pupils are equal, round, and reactive to light.  Cardiovascular:     Rate and Rhythm: Normal rate and regular rhythm.     Pulses: Normal pulses.     Heart sounds: Normal heart sounds.  Pulmonary:     Effort: Pulmonary effort is normal.     Breath sounds: Normal breath sounds.  Abdominal:     General: Bowel sounds are normal.  Musculoskeletal:     Cervical back: Neck supple.      Right lower leg: No edema.     Left lower leg: No edema.     Comments: Left lower extremity wound  Skin:    Capillary Refill: Capillary refill takes less than 2 seconds.  Neurological:     Mental Status: She is alert and oriented to person, place, and time.  Psychiatric:        Mood and Affect: Mood normal.      Labs on Admission: I have personally reviewed following labs and imaging studies  CBC: Recent Labs  Lab 10/23/24 1409 10/28/24 0023  WBC 15.2* 21.0*  NEUTROABS 12.4* 18.1*  HGB 7.1* 6.6*  HCT 23.6* 21.3*  MCV 82.2 82.2  PLT 533* 736*   Basic Metabolic Panel: Recent Labs  Lab 10/23/24 1409 10/28/24 0023  NA 133* 130*  K 3.7 4.8  CL 95* 94*  CO2 27 20*  GLUCOSE 307* 232*  BUN 41* 66*  CREATININE 6.55* 9.93*  CALCIUM  8.8* 8.9   GFR: Estimated Creatinine Clearance: 5.2 mL/min (A) (by C-G formula based on SCr of 9.93 mg/dL (H)). Liver Function Tests: No results for input(s): AST, ALT, ALKPHOS, BILITOT, PROT,  ALBUMIN  in the last 168 hours. No results for input(s): LIPASE, AMYLASE in the last 168 hours. No results for input(s): AMMONIA in the last 168 hours. Coagulation Profile: No results for input(s): INR, PROTIME in the last 168 hours. Cardiac Enzymes: No results for input(s): CKTOTAL, CKMB, CKMBINDEX, TROPONINI, TROPONINIHS in the last 168 hours. BNP (last 3 results) Recent Labs    11/04/23 0209 11/05/23 0247 11/06/23 0345  BNP 74.4 179.0* 244.4*   HbA1C: No results for input(s): HGBA1C in the last 72 hours. CBG: No results for input(s): GLUCAP in the last 168 hours. Lipid Profile: No results for input(s): CHOL, HDL, LDLCALC, TRIG, CHOLHDL, LDLDIRECT in the last 72 hours. Thyroid  Function Tests: No results for input(s): TSH, T4TOTAL, FREET4, T3FREE, THYROIDAB in the last 72 hours. Anemia Panel: No results for input(s): VITAMINB12, FOLATE, FERRITIN, TIBC, IRON , RETICCTPCT in  the last 72 hours. Urine analysis:    Component Value Date/Time   COLORURINE YELLOW 10/28/2023 0416   APPEARANCEUR HAZY (A) 10/28/2023 0416   LABSPEC 1.010 10/28/2023 0416   PHURINE 7.0 10/28/2023 0416   GLUCOSEU 150 (A) 10/28/2023 0416   GLUCOSEU NEGATIVE 05/05/2021 1228   HGBUR NEGATIVE 10/28/2023 0416   BILIRUBINUR NEGATIVE 10/28/2023 0416   KETONESUR NEGATIVE 10/28/2023 0416   PROTEINUR >=300 (A) 10/28/2023 0416   UROBILINOGEN 0.2 05/05/2021 1228   NITRITE NEGATIVE 10/28/2023 0416   LEUKOCYTESUR MODERATE (A) 10/28/2023 0416    Radiological Exams on Admission: I have personally reviewed images DG CHEST PORT 1 VIEW Result Date: 10/28/2024 CLINICAL DATA:  Shortness of breath. EXAM: PORTABLE CHEST 1 VIEW COMPARISON:  10/09/2024 FINDINGS: Low lung volumes. Cardiopericardial silhouette is at upper limits of normal for size. Vascular congestion noted with likely component of underlying interstitial edema. No focal airspace consolidation. No substantial pleural effusion. Left IJ central line remains in place with tip overlying the mid SVC level. IMPRESSION: Low volume film with vascular congestion and likely component of underlying interstitial edema. Electronically Signed   By: Camellia Candle M.D.   On: 10/28/2024 05:52   DG Foot Complete Left Result Date: 10/28/2024 EXAM: 3 VIEW(S) XRAY OF THE LEFT FOOT 10/28/2024 02:18:00 AM COMPARISON: 10/23/2024 CLINICAL HISTORY: Concern for infection. FINDINGS: BONES AND JOINTS: No acute fracture. No malalignment. Small inferior calcaneal spur. SOFT TISSUES: Improved soft tissue swelling of the forefoot since prior study. IMPRESSION: 1. Improved soft tissue swelling of the forefoot since prior study. 2. No acute osseous findings. Electronically signed by: Greig Pique MD 10/28/2024 02:22 AM EST RP Workstation: [id number]     Assessment/Plan: Principal Problem:   Acute on chronic anemia Active Problems:   ESRD needing dialysis (HCC)   Pressure injury  of skin   Cellulitis of left lower extremity   Thrombocytosis   Stage IV pressure ulcer of sacral region [facility])   History of CVA (cerebrovascular accident)   History of right below knee amputation (HCC)   Paroxysmal atrial fibrillation (HCC)   Anemia of chronic disease    Assessment and Plan: Acute on chronic anemia Symptomatic anemia Anemia of chronic disease due to ESRD -Presented emergency department multiple complaint include anxiety, shortness of breath, requesting for wound VAC replacement, left lower extremity wound and missed dialysis on Monday. - At presentation to ED patient is hemodynamically stable. - CBC showed low hemoglobin 6.6 (baseline hemoglobin around 7-9).  Normal MCV and low hematocrit. - Patient complaining about shortness of breath. -Acute on chronic anemia in the setting of chronic sacral wound and worsening anemia from underlying ESRD. -  FOBT negative. - In the ED 1 unit of blood transfusion has been initiated. - Need to follow-up with CBC following blood transfusion.   Left lower extremity cellulitis -Left lower extremity chronic wound and cellulitis with skin disruption from chronic pressure injury and developing of ulcer. - X-ray of the left foot showed improved soft tissue swelling of the forefoot since prior study.  No osseous finding. - Blood cultures has been obtained.  Patient is afebrile.  Showing leukocytosis.   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 - Need to follow-up with blood culture result. - Consulted wound care for further evaluation.  Sacral wound-history of chronic wound VAC Stage IV ulcer of the sacral area -Patient reported she has chronic wound VAC of the sacral area however the battery ran out and she is requesting for new wound VAC placement. -Consulted inpatient wound care for wound VAC replacement or battery  replacement.  Thrombocytosis -Elevated platelet count 736.  Baseline around above 500.  Concern for reactive thrombocytosis in the setting of acute infectious/inflammatory process.  Continue to monitor platelet count.  Paroxysmal atrial fibrillation -Continue Coreg . - Holding Eliquis  in the setting of low hemoglobin.  Once hemoglobin proves to baseline can resume Eliquis .  History of CVA -Continue Lipitor .  Eliquis  on hold in the setting of acute anemia.  History of necrotizing fasciitis osteomyelitis of the right leg status post below-knee amputation History of perforated diverticulitis in 2025  ESRD on dialysis MWF schedule -Patient reported missing dialysis on Monday due to adverse weather.  BMP no evidence of electrolyte derangement except low sodium 130.  Normal potassium level.  Low bicarb 20 elevated and gap 17. Chest x-ray showing pulmonary vascular congestion component of underlying testicular edema.  Patient is currently on room air no evidence of respiratory distress and hypoxia. - Consulted nephrology Dr. Tobie for dialysis and volume management.  Anion gap metabolic acidosis-secondary to ESRD - Starting oral bicarb 650 mg twice daily.  Insulin -dependent DM type II -Starting sliding scale insulin  with mealtime coverage.  Chronic physical debility and weakness from chronic underlying chronic medical health problems -Previously patient has been offered skilled nursing facility placement however she declined.  Today she is requesting for SNF placement.  - Consulted PT, OT and case management for further assistance.   DVT prophylaxis:  SCDs Code Status:  Full Code Diet: Renal and carb modified Family Communication:   Family was present at bedside, at the time of interview. Opportunity was given to ask question and all questions were answered satisfactorily.  Disposition Plan: Need to follow-up with blood cultures result and improvement of hemoglobin after blood  transfusion. Consults: PT, OT, case management and nephrology Admission status:   Inpatient, Telemetry bed  Severity of Illness: The appropriate patient status for this patient is INPATIENT. Inpatient status is judged to be reasonable and necessary in order to provide the required intensity of service to ensure the patient's safety. The patient's presenting symptoms, physical exam findings, and initial radiographic and laboratory data in the context of their chronic comorbidities is felt to place them at high risk for further clinical deterioration. Furthermore, it is not anticipated that the patient will be medically stable for discharge from the hospital within 2 midnights of admission.   * I certify that at the point of admission it is my clinical judgment that the patient will require inpatient hospital care spanning beyond 2 midnights from the point of  admission due to high intensity of service, high risk for further deterioration and high frequency of surveillance required.DEWAINE    Tavon Corriher, MD [facility]  *Please note that this is a verbal dictation therefore any spelling or grammatical errors are due to the Dragon [employer] One system interpretation.   Please page via Amion and do not message via secure chat for urgent patient care matters. Secure chat can be used for non urgent patient care matters.  How to contact the [facility] Attending or Consulting provider 7A - 7P or covering provider during after hours 7P -7A, for this patient.  Check the care team in [facility] and look for a) attending/consulting [facility] provider listed and b) the [facility] team listed Log into [email or web address] and use [place]'s universal password to access. If you do not have the password, please contact the hospital operator. Locate the [facility] provider you are looking for under [facility] and page to a number that you can be directly reached. If you still have difficulty reaching the provider, please page the [facility]  (Director on Call) for the Hospitalists listed on amion for assistance.  10/28/2024, 6:00 AM              [1]  Allergies Allergen Reactions   Nsaids Other (See Comments)    CKD stage 4   Lisinopril Cough   Peanut-Containing Drug Products Diarrhea, Itching and Other (See Comments)    GI intolerance    "

## 2024-10-28 NOTE — ED Notes (Signed)
 PT at [facility].

## 2024-10-28 NOTE — Consult Note (Signed)
 Renal Service Consult Note [facility] [name], MD  Patient: [name] Date: 10/28/2024 Requesting Physician: Dr. Dorinda  Reason for Consult: ESRD pt w/ anemia and LLE cellulitis HPI: The patient is a 69 y.o. year-old w/ PMH significant for anemia, atrial fibrillation, CAD, diabetes, ESRD on HD, HL, hypertension, sacral decubitus ulcer who presented late last night to the ED reporting SOB and also a wound on her left foot.  And is legally blind and gets dialysis MWF.  She missed dialysis on Monday this week.  In ED BP was 119/68, HR 77, RR 16, temp 98.  Labs showed K+ 3.7, BUN 41 and creatinine 6.5.  WBC 15K, hemoglobin 6.6-7.1.  Chest x-ray showed vascular congestion and possible interstitial edema.  Patient was admitted by the hospitalist team and blood transfusion was ordered.  Imaging of the left foot was done on and blood cultures were obtained.  IV antibiotics were started with Zosyn  and linezolid .  We are asked to see for dialysis.     Pt seen in ED room.  Complaints are as above.  Denies any shortness of breath at the moment.  She is on room air.   ROS - denies CP, no joint pain, no HA, no blurry vision, no rash, no diarrhea, no nausea/ vomiting   Past Medical History  Past Medical History:  Diagnosis Date   Anemia    Arthritis    Atrial fibrillation (HCC)    CAD (coronary artery disease)    Chronic kidney disease, stage III (moderate) (HCC)    Constipation    Diabetes mellitus (HCC)    ESRD on dialysis (HCC) 04/20/2024   History of blood transfusion    HLD (hyperlipidemia)    Hypertension    Myocardial infarction [facility])    in April 2014   Septic shock (HCC) 04/19/2024   Wound infection after surgery 03/17/2013   Past Surgical History  Past Surgical History:  Procedure Laterality Date   AMPUTATION Right 04/20/2024   Procedure: AMPUTATION BELOW KNEE;  Surgeon: Harden Jerona GAILS, MD;  Location: [facility] OR;  Service: Orthopedics;  Laterality: Right;    ANKLE SURGERY Right    x 6 total   BREAST BIOPSY Left    15 or 20 yearsa ago she cant remember laterality    HARDWARE REMOVAL Right 02/13/2013   Procedure: HARDWARE REMOVAL;  Surgeon: Jerona GAILS Harden, MD;  Location: [facility] OR;  Service: Orthopedics;  Laterality: Right;  Right Ankle Removal Hardware, Debridement, Place Wound VAC   IR FLUORO GUIDE CV LINE LEFT  11/08/2023   IR FLUORO GUIDE CV LINE LEFT  04/23/2024   IR FLUORO GUIDE CV LINE RIGHT  11/01/2023   IR FLUORO GUIDE CV LINE RIGHT  11/12/2023   IR REMOVAL TUN CV CATH W/O FL  04/21/2024   IR US  GUIDE VASC ACCESS LEFT  11/08/2023   IR US  GUIDE VASC ACCESS LEFT  04/23/2024   IR US  GUIDE VASC ACCESS RIGHT  11/01/2023   IR US  GUIDE VASC ACCESS RIGHT  11/12/2023   laser surgery for diabetic retinopathy Bilateral    LEFT HEART CATHETERIZATION WITH CORONARY ANGIOGRAM N/A 01/09/2013   Procedure: LEFT HEART CATHETERIZATION WITH CORONARY ANGIOGRAM;  Surgeon: Toribio JONELLE Fuel, MD;  Location: [facility] CATH LAB;  Service: Cardiovascular;  Laterality: N/A;   ORIF ANKLE FRACTURE Right 01/12/2013   Procedure: OPEN REDUCTION INTERNAL FIXATION (ORIF) ANKLE FRACTURE;  Surgeon: Jerona GAILS Harden, MD;  Location: [facility] OR;  Service: Orthopedics;  Laterality: Right;  PARTIAL COLECTOMY N/A 11/02/2023   Procedure: OPEN PARTIAL COLECTOMY WITH END COLOSTOMY;  Surgeon: Dasie Leonor CROME, MD;  Location: [facility] OR;  Service: General;  Laterality: N/A;   PARTIAL HYSTERECTOMY     REVISION AMPUTATION, BELOW THE KNEE Right 06/24/2024   Procedure: REVISION AMPUTATION, BELOW THE KNEE;  Surgeon: Harden Jerona GAILS, MD;  Location: [facility] OR;  Service: Orthopedics;  Laterality: Right;   TUNNELLED CATHETER EXCHANGE N/A 04/15/2024   Procedure: TUNNELLED CATHETER EXCHANGE;  Surgeon: Pearline Norman RAMAN, MD;  Location: [facility] LAB;  Service: Cardiovascular;  Laterality: N/A;   TUNNELLED CATHETER EXCHANGE N/A 07/23/2024   Procedure: TUNNELLED CATHETER EXCHANGE;  Surgeon: Serene Gaile ORN, MD;  Location: [facility] LAB;  Service:  Cardiovascular;  Laterality: N/A;   Family History  Family History  Problem Relation Age of Onset   Diabetes Mother        No history CAD   Colon cancer Mother 33       died at 45 from CRC   Hypertension Father        Also had CAD   Heart disease Father    Cervical cancer Sister    Diabetes Sister    Congestive Heart Failure Sister    Heart disease Maternal Grandmother    Diabetes Brother    Kidney disease Brother    Sickle cell anemia Son    Thyroid  disease Son    Breast cancer Neg Hx    BRCA 1/2 Neg Hx    Social History  reports that she has never smoked. She has never used smokeless tobacco. She reports that she does not drink alcohol  and does not use drugs. Allergies Allergies[1] Home medications Prior to Admission medications  Medication Sig Start Date End Date Taking? Authorizing Provider  acetaminophen  (TYLENOL ) 325 MG tablet Take 325 mg by mouth daily as needed for mild pain (pain score 1-3) or moderate pain (pain score 4-6).   Yes [provider]  albuterol  (PROVENTIL ) (2.5 MG/3ML) 0.083% nebulizer solution Take 3 mLs (2.5 mg total) by nebulization every 2 (two) hours as needed for wheezing. 11/25/23  Yes Ghimire, Donalda HERO, MD  apixaban  (ELIQUIS ) 2.5 MG TABS tablet Take 2.5 mg by mouth 2 (two) times daily.   Yes [provider]  atorvastatin  (LIPITOR ) 40 MG tablet Take 1 tablet (40 mg total) by mouth daily. 10/06/24  Yes Lue Elsie BROCKS, MD  brimonidine  (ALPHAGAN ) 0.2 % ophthalmic solution Place 1 drop into both eyes 2 (two) times daily.   Yes [provider]  carvedilol  (COREG ) 12.5 MG tablet Take 12.5 mg by mouth 2 (two) times daily with a meal.   Yes [provider]  HYDROcodone -acetaminophen  (NORCO/VICODIN) 5-325 MG tablet Take 1 tablet by mouth 2 (two) times daily as needed for moderate pain (pain score 4-6). 10/22/24  Yes [provider]  isosorbide  mononitrate (IMDUR ) 30 MG 24 hr tablet Take 30 mg by mouth daily.   Yes  [provider]  LANTUS  SOLOSTAR 100 UNIT/ML Solostar Pen Inject 12 Units into the skin daily. 10/22/24  Yes [provider]  latanoprost  (XALATAN ) 0.005 % ophthalmic solution Place 1 drop into both eyes at bedtime.   Yes [provider]  melatonin 5 MG TABS Take 10 mg by mouth at bedtime as needed (for insomnia).   Yes [provider]  midodrine  (PROAMATINE ) 2.5 MG tablet Take 2.5 mg by mouth daily as needed (low bp).   Yes [provider]  Multiple Vitamin (MULTIVITAMIN WITH MINERALS)  TABS Take 1 tablet by mouth daily.   Yes [provider]  NOVOLIN R 100 UNIT/ML injection Inject 0-9 Units into the skin in the morning, at noon, and at bedtime. Per sliding scale 04/03/24  Yes [provider]  nutrition supplement, JUVEN, (JUVEN) PACK Take 1 packet by mouth 2 (two) times daily between meals. Patient taking differently: Take 1 packet by mouth 3 (three) times a week. 06/09/24  Yes Myrna Bitters, DO  zinc  sulfate, 50mg  elemental zinc , 220 (50 Zn) MG capsule Take 1 capsule (220 mg total) by mouth daily. 06/10/24  Yes Myrna Bitters, DO  Darbepoetin Alfa  (ARANESP ) 200 MCG/0.4ML SOSY injection Inject 0.4 mLs (200 mcg total) into the skin every Sunday at 6pm. 06/14/24   King, Olivia, DO  doxycycline (MONODOX) 100 MG capsule Take 100 mg by mouth 2 (two) times daily. Patient not taking: Reported on 10/28/2024 10/27/24   [provider]  Methoxy PEG-Epoetin Beta (MIRCERA IJ) Inject 150 mcg into the vein every 14 (fourteen) days. 06/12/24 10/08/25  [provider]     Vitals:   10/28/24 9160 10/28/24 0845 10/28/24 0910 10/28/24 0945  BP:  (!) 118/57  (!) 146/87  Pulse:  68  74  Resp:  (!) 0 16 20  Temp:    98 F (36.7 C)  TempSrc:    Oral  SpO2: 100% 98%  99%  Weight:      Height:       Exam Gen alert, no distress, chronically ill-appearing Sclera anicteric, throat clear  [name] or bruits Chest clear bilat to bases RRR no  MRG Abd soft ntnd no mass or ascites +bs Ext right BKA, LLE 1+ edema, no other edema Neuro is alert, Ox 3 , nf    TDC in chest intact  Home bp meds: Coreg  Imdur  Midodrine  2.5 mg   [facility] HD[facility]  From jan 2026 -> 4h  B350   53kg  TDC  Hep 2400      Assessment/ Plan:  # ESRD - on HD MWF, missed OP HD on monday - plan is for HD today   # BP - bp's are not high, holding home bp meds  # Volume - CXR vasc congestion and possibly early IS edema - UF goal 2-3 L as tolerated   # Anemia of esrd - Hb 6.6 here, prbc's have been ordered  # LLE cellulitis - getting IV abx per pmd  # anemia  - low Hb 6.5 today, prbc's ordered and running per pmd     Rob Geralynn  MD [facility] 10/28/2024, 9:49 AM  Recent Labs  Lab 10/23/24 1409 10/28/24 0023  HGB 7.1* 6.6*  CALCIUM  8.8* 8.9  CREATININE 6.55* 9.93*  K 3.7 4.8   Inpatient medications:  atorvastatin   40 mg Oral Daily   carvedilol   12.5 mg Oral BID WC   Chlorhexidine  Gluconate Cloth  6 each Topical Q0600   insulin  aspart  0-5 Units Subcutaneous QHS   insulin  aspart  0-9 Units Subcutaneous TID WC   isosorbide  mononitrate  30 mg Oral Daily   latanoprost   1 drop Both Eyes QHS   sodium bicarbonate   650 mg Oral BID   sodium chloride  flush  3 mL Intravenous Q12H    sodium chloride      linezolid  (ZYVOX ) IV Stopped (10/28/24 0846)   piperacillin -tazobactam (ZOSYN )  IV Stopped (10/28/24 0746)   sodium chloride , acetaminophen  **OR** acetaminophen , melatonin, midodrine , ondansetron  **OR** ondansetron  (ZOFRAN ) IV, sodium chloride  flush      [  1]  Allergies Allergen Reactions   Nsaids Other (See Comments)    CKD stage 4   Lisinopril Cough   Peanut-Containing Drug Products Diarrhea, Itching and Other (See Comments)    GI intolerance

## 2024-10-28 NOTE — Progress Notes (Signed)
 " Progress Note   Patient: [name] DOB: 1956/08/09 DOA: 10/27/2024     0 DOS: the patient was seen and examined on 10/28/2024   Brief hospital course: [name] is a 69 y.o. 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 Patient also endorsing needs dialysis on Monday due to adverse weather. Patient supposed to have wound VAC placement of the sacral area however stated that battery is dead and she does not have power at home.   Also complaining about concern for possible infection of the left lower extremity. Of note patient was seen emergency department multiple times plan to place into [facility] skilled nursing but patient eventually left AMA. She states that she wishes to go to a facility at this time in a state of discharging to home.   Patient is endorsing exertional shortness of breath. she denies fever, chill, cough, heartburn, nausea, vomiting, abdominal pain, dizziness, headache,.  She is also endorsing back pain.  Denies any lower extremity pain.     ED Course:  At presentation to ED patient is hemodynamically stable.  Afebrile.   CBC showing elevated WBC count 21, low hemoglobin 6.6 baseline hemoglobin around 7-9 and elevated platelet count 761. See patient low sodium 130, elevated blood glucose 232, low bicarb, elevated anion gap 17 and evidence of ESRD. FOBT negative. X-ray of the left foot: IMPRESSION: 1. Improved soft tissue swelling of the forefoot since  prior study. 2. No acute osseous findings.   In the ED 1 unit of blood transfusion has been ordered and received IV cefazolin .   Hospitalist consulted for further evaluation management of left lower extremity cellulitis, acute on chronic anemia, thrombocytosis, wound VAC placement of the sacral wound and volume overload with need for dialysis.  Assessment and Plan: Acute on chronic anemia Symptomatic anemia Anemia of chronic disease due to ESRD -Presented emergency department multiple complaint include anxiety, shortness of breath, requesting for wound VAC replacement, left lower extremity wound and missed dialysis on Monday. - At presentation to ED patient is hemodynamically stable. - CBC showed low hemoglobin 6.6 (baseline hemoglobin around 7-9).  Normal MCV and low hematocrit. - Patient complaining about shortness of breath. -Acute on chronic anemia in the setting of chronic sacral wound and worsening anemia from underlying ESRD. - FOBT negative. - In the ED 1 unit of blood transfusion has been initiated. - Need to follow-up with CBC following blood transfusion.     Left lower extremity cellulitis -Left lower extremity chronic wound and cellulitis with skin disruption from chronic pressure injury and developing of ulcer. - X-ray of the left foot showed improved soft tissue swelling of the forefoot since prior study.  No osseous finding. - Blood cultures has been obtained.  Patient is afebrile.  Showing leukocytosis.   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 - Need to follow-up with blood culture result. - Consulted wound care for further evaluation.   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 -Consulted inpatient wound  care for wound VAC replacement or battery replacement.   Thrombocytosis -Elevated platelet count 736.  Baseline around above 500.  Concern for reactive thrombocytosis in the setting of acute infectious/inflammatory process.  Continue to monitor platelet count.   Paroxysmal atrial fibrillation -Continue Coreg . - Holding Eliquis  in the setting of low hemoglobin.  Once hemoglobin proves to baseline can resume Eliquis .   History of CVA -Continue Lipitor .  Eliquis  on hold in the setting of acute anemia.   History of necrotizing fasciitis osteomyelitis of the right leg status post below-knee amputation History of perforated diverticulitis in 2025   ESRD on dialysis MWF schedule -Patient reported missing dialysis on Monday due to adverse weather.  BMP no evidence of electrolyte derangement except low sodium 130.  Normal potassium level.  Low bicarb 20 elevated and gap 17. Chest x-ray showing pulmonary vascular congestion component of underlying testicular edema.  Patient is currently on room air no evidence of respiratory distress and hypoxia. - Consulted nephrology Dr. Tobie for dialysis and volume management.   Anion gap metabolic acidosis-secondary to ESRD - Starting oral bicarb 650 mg twice daily.   Insulin -dependent DM type II -Starting sliding scale insulin  with mealtime coverage.   Chronic physical debility and weakness from chronic underlying chronic medical health problems -Previously patient has been offered skilled nursing facility placement however she declined.  Today she is requesting for SNF placement.  - Consulted PT, OT and case management for further assistance.     DVT prophylaxis:  SCDs Code Status:  Full Code Diet: Renal and carb modified Family Communication:   Family was present at bedside, at the time of interview. Opportunity was given to ask question and all questions were answered satisfactorily.  Disposition Plan: Need to follow-up with blood cultures result  and improvement of hemoglobin after blood transfusion. Consults: PT, OT, case management and nephrology Admission status:   Inpatient, Telemetry bed   Subjective: ***  Physical Exam: Constitutional:      Appearance: She is ill-appearing.  HENT:     Mouth/Throat:     Mouth: Mucous membranes are moist.  Eyes:     Pupils: Pupils are equal, round, and reactive to light.  Cardiovascular:     Rate and Rhythm: Normal rate and regular rhythm.     Pulses: Normal pulses.     Heart sounds: Normal heart sounds.  Pulmonary:     Effort: Pulmonary effort is normal.     Breath sounds: Normal breath sounds.  Abdominal:     General: Bowel sounds are normal.  Musculoskeletal:     Cervical back: Neck supple.     Right lower leg: No edema.     Left lower leg: No edema.     Comments: Left lower extremity wound  Skin:    Capillary Refill: Capillary refill takes less than 2 seconds.  Neurological:     Mental Status: She is alert and oriented to person, place, and time.  Psychiatric:        Mood and Affect: Mood normal.   Vitals:   10/28/24 0839 10/28/24 0845 10/28/24 0910 10/28/24 0945  BP:  (!) 118/57  (!) 146/87  Pulse:  68  74  Resp:  (!) 0 16 20  Temp:    98 F (36.7 C)  TempSrc:    Oral  SpO2: 100% 98%  99%  Weight:      Height:        Data Reviewed:     Latest Ref Rng & Units 10/28/2024   12:23 AM 10/23/2024    2:09 PM 10/10/2024    5:50 PM  CBC  WBC 4.0 - 10.5 K/uL 21.0  15.2  15.7   Hemoglobin 12.0 - 15.0 g/dL 6.6  7.1  7.8   Hematocrit 36.0 - 46.0 % 21.3  23.6  25.5   Platelets 150 - 400 K/uL 736  533  438        Latest Ref Rng & Units 10/28/2024   12:23 AM 10/23/2024    2:09 PM 10/10/2024    5:50 PM  BMP  Glucose 70 - 99 mg/dL 767  692  767   BUN 8 - 23 mg/dL 66  41  28   Creatinine 0.44 - 1.00 mg/dL 0.06  3.44  4.60   Sodium 135 - 145 mmol/L 130  133  137   Potassium 3.5 - 5.1 mmol/L 4.8  3.7  3.6   Chloride 98 - 111 mmol/L 94  95  99   CO2 22 - 32 mmol/L 20  27   25    Calcium  8.9 - 10.3 mg/dL 8.9  8.8  8.5      Author: Drue ONEIDA Potter, MD 10/28/2024 1:59 PM  For on call review [email or web address].  "

## 2024-10-28 NOTE — Evaluation (Signed)
 Occupational Therapy Evaluation Patient Details Name: [name] MRN: [id number] DOB: 06/11/1956 Today's Date: 10/28/2024   History of Present Illness   Pt is 69 year old presented to [facility] on  10/27/24 for lt foot wound. Pt with multiple recent hospitalizations and declined SNF placement at those times. Pt now agreeable to SNF.   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     Clinical Impressions Pt c/o sacral pain, 7/10. Pt lives with 3 sons who assist with all needs, states she has not been able to stand/transfer for a few weeks since recent stroke. Sons lift her up and place in w/c or BSC. Pt currently at her recent baseline, did not attempt standing due to weakness in LLE, and history of R BKA. Pt reports dominant LUE weakness from stroke has improved but still has difficulty with FM skills, unable to use for feeding but can use for bathing or dressing. Pt overall has good rehab potential, hopeful to get back to stand pivot transfers with prosthetic and managing ADLs with set up. Pt would benefit from postacute rehab <3hrs/day, will continue to see acutely to progress as able.      If plan is discharge home, recommend the following:   Two people to help with walking and/or transfers;A lot of help with bathing/dressing/bathroom;Assistance with cooking/housework;Assist for transportation;Help with stairs or ramp for entrance     Functional Status Assessment   Patient has had a recent decline in their functional status and demonstrates the ability to make significant improvements in function in a reasonable and predictable amount of time.     Equipment Recommendations   Other (comment) (defer)     Recommendations for Other Services         Precautions/Restrictions   Precautions Precautions: Fall Recall of Precautions/Restrictions:  Impaired Precaution/Restrictions Comments: Pt reports she is legally blind; R prosthesis Restrictions Weight Bearing Restrictions Per Provider Order: No     Mobility Bed Mobility Overal bed mobility: Needs Assistance Bed Mobility: Sidelying to Sit, Sit to Sidelying   Sidelying to sit: Mod assist, HOB elevated     Sit to sidelying: Min assist, HOB elevated General bed mobility comments: Assist to elevate trunk into sitting. Assist to guide LLE up onto stretcher returning to sidelying    Transfers                   General transfer comment: did not attempt      Balance Overall balance assessment: Needs assistance Sitting-balance support: Single extremity supported, Feet supported Sitting balance-Leahy Scale: Poor Sitting balance - Comments: edge of stretcher, one hand supported for maintaining balance                                   ADL either performed or assessed with clinical judgement   ADL                                               Vision Baseline Vision/History: 2 Legally blind Ability to See in Adequate Light: 4 Severely impaired Patient Visual Report: No change from baseline       Perception  Praxis         Pertinent Vitals/Pain Pain Assessment Pain Assessment: 0-10 Pain Score: 7  Pain Location: sacral wound Pain Descriptors / Indicators: Aching, Discomfort, Constant Pain Intervention(s): Monitored during session     Extremity/Trunk Assessment Upper Extremity Assessment Upper Extremity Assessment: LUE deficits/detail LUE Deficits / Details: Pt reports L handn umbness from stroke a few weeks ago, but doing better. decreased FM skills in L hand but able to use for GM activities LUE Sensation: decreased light touch LUE Coordination: decreased fine motor   Lower Extremity Assessment Lower Extremity Assessment: RLE deficits/detail;LLE deficits/detail;Generalized weakness RLE Deficits / Details:  BKA without prosthetic currently available LLE Deficits / Details: Significant wound on dorsum of foot/ankle/lower leg   Cervical / Trunk Assessment Cervical / Trunk Assessment: Other exceptions Cervical / Trunk Exceptions: incr body habitus   Communication Communication Communication: No apparent difficulties   Cognition Arousal: Alert Behavior During Therapy: WFL for tasks assessed/performed Cognition: No apparent impairments             OT - Cognition Comments: Pt A/O                 Following commands: Intact       Cueing  General Comments   Cueing Techniques: Verbal cues;Tactile cues  VSS on RA   Exercises     Shoulder Instructions      Home Living Family/patient expects to be discharged to:: Private residence Living Arrangements: Children Available Help at Discharge: Family;Available 24 hours/day Type of Home: House Home Access: Level entry     Home Layout: Multi-level;Able to live on main level with bedroom/bathroom Alternate Level Stairs-Number of Steps: 5 Alternate Level Stairs-Rails: Left Bathroom Shower/Tub: Walk-in shower;Sponge bathes at baseline   Bathroom Toilet: Standard Bathroom Accessibility: No   Home Equipment: Cane - single point;Wheelchair - Forensic Psychologist (2 wheels);Shower seat;BSC/3in1;Lift chair;Hospital bed;Wheelchair - power   Additional Comments: Pt lives with 3 sons, they help with all needs      Prior Functioning/Environment Prior Level of Function : Needs assist       Physical Assist : Mobility (physical);ADLs (physical) Mobility (physical): Bed mobility;Transfers ADLs (physical): Bathing;Dressing;Toileting;IADLs Mobility Comments: Pt reports she has not been able to amb after recentl hospital admissions. Reports heavy assist from son for transfer to w/c. Reports she hasn't been using her rt prosthetic. ADLs Comments: Pt reports she needs assistance with her colostomy care. Sponge baths. Will use BSC. Sons  do wound care and IADLs    OT Problem List: Impaired balance (sitting and/or standing);Impaired vision/perception   OT Treatment/Interventions: Self-care/ADL training;DME and/or AE instruction;Balance training;Patient/family education      OT Goals(Current goals can be found in the care plan section)   Acute Rehab OT Goals Patient Stated Goal: to manage pain OT Goal Formulation: With patient Time For Goal Achievement: 11/11/24 Potential to Achieve Goals: Good   OT Frequency:  Min 2X/week    Co-evaluation              AM-PAC OT 6 Clicks Daily Activity     Outcome Measure Help from another person eating meals?: A Little Help from another person taking care of personal grooming?: A Little Help from another person toileting, which includes using toliet, bedpan, or urinal?: A Lot Help from another person bathing (including washing, rinsing, drying)?: A Lot Help from another person to put on and taking off regular upper body clothing?: A Lot Help from another person to put on and taking off regular lower  body clothing?: A Lot 6 Click Score: 14   End of Session Nurse Communication: Mobility status  Activity Tolerance: Patient tolerated treatment well Patient left: in bed;with call bell/phone within reach  OT Visit Diagnosis: Unsteadiness on feet (R26.81);Other abnormalities of gait and mobility (R26.89);Muscle weakness (generalized) (M62.81);Low vision, both eyes (H54.2)                Time: 8841-8785 OT Time Calculation (min): 16 min Charges:  OT General Charges $OT Visit: 1 Visit OT Evaluation $OT Eval Moderate Complexity: 1 [address], OTR/L   Elouise JONELLE Bott 10/28/2024, 2:10 PM

## 2024-10-28 NOTE — Consult Note (Signed)
 WOC Nurse Consult Note: Reason for Consult: Sacral stage 4 pressure injury  Cellulitis with full thickness tissue injury to  Wound type: Pressure Injury POA: Yes Measurement: 4 cm x 4 cm x 0.7 cm with undermining circumferentially, extends 0.8 cm  Wound bed:red and moist  Drainage (amount, consistency, odor) minimal serosanguinous  no odor  Periwound:maceration from 4 to 8 o'clock.  VAC device was not being used at home and dressing was in place with no suction for an extended period of time.  Dressing procedure/placement/frequency: Skin prep and barrier ring around wound opening. FIll wounds depth with black foam.  Bridged additional foam to left hip, that is protected with drape.  Covered with drape.  Medium VAC kit WD# [id number].  There is an order placed for SANTYL  for sacral wound. This is not needed and I have requested that medication be returned unused to pharmacy.  Left anterior lower leg and dorsal foot with cellulitis and peeling epithelium, dried with devitalized tissue. Is on Zosyn  for infection.  Cleanse left lower leg with VASHE (WD# [id number]) Apply silvadene  cream twice daily.  Cover with gauze and kerlix/tape.  WOC Nurse ostomy consult note Stoma type/location: LLQ colostomy  POuch intact, will change as it appears to be several days old.  Stomal assessment/size: 1 1/4 pink and moist Peristomal assessment: pink, intact Treatment options for stomal/peristomal skin: barrier ring [employer] number#[id number], and 2 piece convex pouch (does not like the 1piece ) barrier WD # [id number] pouch WD # [id number])  Output  1 box of extenders [employer] [phone].  Output: soft brown stool  Ostomy pouching: see above  Education provided: See above  Explained to patient about wound care to sacrum, leg and ostomy care.  WIll work on setting her up for home ostomy supplies.  She states she has to order from [employer]  Enrolled patient in [employer] DC program: Yes previously.  Will follow. Awaiting  placement in SNF.  Darice Cooley MSN, RN, FNP-BC CWON Wound, Ostomy, Continence Nurse Outpatient [facility] [phone] Work cell phone:  [phone]

## 2024-10-28 NOTE — ED Notes (Signed)
OT at BS.  

## 2024-10-28 NOTE — Consult Note (Signed)
 Renal Service Consult Note [facility] [name], MD  Patient: [name] Date: 10/28/2024 Requesting Physician: Dr. Dorinda  Reason for Consult: ESRD pt w/ SOB HPI: The patient is a 69 y.o. year-old w/ PMH significant for anemia, atrial fibrillation, PAD h/o right BKA, CAD, CVA, ESRD on HD, diabetes type 2, HTN who presented to ED complaining of shortness of breath.  She had recently been in the hospital for possible infection of the left leg and refused plans to place her in SNF; patient eventually left AMA. She did miss her dialysis on Monday.  Also she has a wound on her left foot.  In ED BP 120/68, HR 78, RR 16, temp 98, 98% O2 sat on room air.  K+ 4.8, BUN 66, creatinine 9.9, WBC 21K, Hgb 6.6.  Chest x-ray showed vascular congestion and possible early interstitial edema.  Patient was given Coreg , subcu insulin , Imdur , IV Ancef  and Zyvox  and IV Zosyn .  Was admitted.  We are asked to see for dialysis.   Pt seen in ED room.  Patient states she gets to dialysis at [facility] on [address].  Monday Wednesday Friday, missed dialysis Monday due to the weather.  She denies any shortness of breath or chest pain.  Has wounds on her leg which are due to cellulitis, according to the patient.   ROS - denies CP, no joint pain, no HA, no blurry vision, no rash, no diarrhea, no nausea/ vomiting   Past Medical History  Past Medical History:  Diagnosis Date   Anemia    Arthritis    Atrial fibrillation (HCC)    CAD (coronary artery disease)    Chronic kidney disease, stage III (moderate) (HCC)    Constipation    Diabetes mellitus (HCC)    ESRD on dialysis (HCC) 04/20/2024   History of blood transfusion    HLD (hyperlipidemia)    Hypertension    Myocardial infarction [facility])    in April 2014   Septic shock (HCC) 04/19/2024   Wound infection after surgery 03/17/2013   Past Surgical History  Past Surgical History:  Procedure Laterality Date   AMPUTATION Right  04/20/2024   Procedure: AMPUTATION BELOW KNEE;  Surgeon: Harden Jerona GAILS, MD;  Location: [facility] OR;  Service: Orthopedics;  Laterality: Right;   ANKLE SURGERY Right    x 6 total   BREAST BIOPSY Left    15 or 20 yearsa ago she cant remember laterality    HARDWARE REMOVAL Right 02/13/2013   Procedure: HARDWARE REMOVAL;  Surgeon: Jerona GAILS Harden, MD;  Location: [facility] OR;  Service: Orthopedics;  Laterality: Right;  Right Ankle Removal Hardware, Debridement, Place Wound VAC   IR FLUORO GUIDE CV LINE LEFT  11/08/2023   IR FLUORO GUIDE CV LINE LEFT  04/23/2024   IR FLUORO GUIDE CV LINE RIGHT  11/01/2023   IR FLUORO GUIDE CV LINE RIGHT  11/12/2023   IR REMOVAL TUN CV CATH W/O FL  04/21/2024   IR US  GUIDE VASC ACCESS LEFT  11/08/2023   IR US  GUIDE VASC ACCESS LEFT  04/23/2024   IR US  GUIDE VASC ACCESS RIGHT  11/01/2023   IR US  GUIDE VASC ACCESS RIGHT  11/12/2023   laser surgery for diabetic retinopathy Bilateral    LEFT HEART CATHETERIZATION WITH CORONARY ANGIOGRAM N/A 01/09/2013   Procedure: LEFT HEART CATHETERIZATION WITH CORONARY ANGIOGRAM;  Surgeon: Toribio JONELLE Fuel, MD;  Location: [facility] CATH LAB;  Service: Cardiovascular;  Laterality: N/A;   ORIF ANKLE  FRACTURE Right 01/12/2013   Procedure: OPEN REDUCTION INTERNAL FIXATION (ORIF) ANKLE FRACTURE;  Surgeon: Jerona LULLA Sage, MD;  Location: [facility] OR;  Service: Orthopedics;  Laterality: Right;   PARTIAL COLECTOMY N/A 11/02/2023   Procedure: OPEN PARTIAL COLECTOMY WITH END COLOSTOMY;  Surgeon: Dasie Leonor CROME, MD;  Location: [facility] OR;  Service: General;  Laterality: N/A;   PARTIAL HYSTERECTOMY     REVISION AMPUTATION, BELOW THE KNEE Right 06/24/2024   Procedure: REVISION AMPUTATION, BELOW THE KNEE;  Surgeon: Sage Jerona LULLA, MD;  Location: [facility] OR;  Service: Orthopedics;  Laterality: Right;   TUNNELLED CATHETER EXCHANGE N/A 04/15/2024   Procedure: TUNNELLED CATHETER EXCHANGE;  Surgeon: Pearline Norman RAMAN, MD;  Location: [facility] LAB;  Service: Cardiovascular;  Laterality: N/A;   TUNNELLED  CATHETER EXCHANGE N/A 07/23/2024   Procedure: TUNNELLED CATHETER EXCHANGE;  Surgeon: Serene Gaile ORN, MD;  Location: [facility];  Service: Cardiovascular;  Laterality: N/A;   Family History  Family History  Problem Relation Age of Onset   Diabetes Mother        No history CAD   Colon cancer Mother 75       died at 28 from CRC   Hypertension Father        Also had CAD   Heart disease Father    Cervical cancer Sister    Diabetes Sister    Congestive Heart Failure Sister    Heart disease Maternal Grandmother    Diabetes Brother    Kidney disease Brother    Sickle cell anemia Son    Thyroid  disease Son    Breast cancer Neg Hx    BRCA 1/2 Neg Hx    Social History  reports that she has never smoked. She has never used smokeless tobacco. She reports that she does not drink alcohol  and does not use drugs. Allergies Allergies[1] Home medications Prior to Admission medications  Medication Sig Start Date End Date Taking? Authorizing Provider  acetaminophen  (TYLENOL ) 325 MG tablet Take 325 mg by mouth daily as needed for mild pain (pain score 1-3) or moderate pain (pain score 4-6).   Yes [provider]  albuterol  (PROVENTIL ) (2.5 MG/3ML) 0.083% nebulizer solution Take 3 mLs (2.5 mg total) by nebulization every 2 (two) hours as needed for wheezing. 11/25/23  Yes Ghimire, Donalda HERO, MD  apixaban  (ELIQUIS ) 2.5 MG TABS tablet Take 2.5 mg by mouth 2 (two) times daily.   Yes [provider]  atorvastatin  (LIPITOR ) 40 MG tablet Take 1 tablet (40 mg total) by mouth daily. 10/06/24  Yes Lue Elsie BROCKS, MD  brimonidine  (ALPHAGAN ) 0.2 % ophthalmic solution Place 1 drop into both eyes 2 (two) times daily.   Yes [provider]  carvedilol  (COREG ) 12.5 MG tablet Take 12.5 mg by mouth 2 (two) times daily with a meal.   Yes [provider]  HYDROcodone -acetaminophen  (NORCO/VICODIN) 5-325 MG tablet Take 1 tablet by mouth 2 (two) times daily as needed for moderate pain  (pain score 4-6). 10/22/24  Yes [provider]  isosorbide  mononitrate (IMDUR ) 30 MG 24 hr tablet Take 30 mg by mouth daily.   Yes [provider]  LANTUS  SOLOSTAR 100 UNIT/ML Solostar Pen Inject 12 Units into the skin daily. 10/22/24  Yes [provider]  latanoprost  (XALATAN ) 0.005 % ophthalmic solution Place 1 drop into both eyes at bedtime.   Yes [provider]  melatonin 5 MG TABS Take 10 mg by mouth at bedtime as needed (for insomnia).   Yes  [provider]  midodrine  (PROAMATINE ) 2.5 MG tablet Take 2.5 mg by mouth daily as needed (low bp).   Yes [provider]  Multiple Vitamin (MULTIVITAMIN WITH MINERALS) TABS Take 1 tablet by mouth daily.   Yes [provider]  NOVOLIN R 100 UNIT/ML injection Inject 0-9 Units into the skin in the morning, at noon, and at bedtime. Per sliding scale 04/03/24  Yes [provider]  nutrition supplement, JUVEN, (JUVEN) PACK Take 1 packet by mouth 2 (two) times daily between meals. Patient taking differently: Take 1 packet by mouth 3 (three) times a week. 06/09/24  Yes Myrna Bitters, DO  zinc  sulfate, 50mg  elemental zinc , 220 (50 Zn) MG capsule Take 1 capsule (220 mg total) by mouth daily. 06/10/24  Yes Myrna Bitters, DO  Darbepoetin Alfa  (ARANESP ) 200 MCG/0.4ML SOSY injection Inject 0.4 mLs (200 mcg total) into the skin every Sunday at 6pm. 06/14/24   King, Olivia, DO  doxycycline (MONODOX) 100 MG capsule Take 100 mg by mouth 2 (two) times daily. Patient not taking: Reported on 10/28/2024 10/27/24   [provider]  Methoxy PEG-Epoetin Beta (MIRCERA IJ) Inject 150 mcg into the vein every 14 (fourteen) days. 06/12/24 10/08/25  [provider]     Vitals:   10/28/24 9160 10/28/24 0845 10/28/24 0910 10/28/24 0945  BP:  (!) 118/57  (!) 146/87  Pulse:  68  74  Resp:  (!) 0 16 20  Temp:    98 F (36.7 C)  TempSrc:    Oral  SpO2: 100% 98%  99%  Weight:      Height:        Exam Gen alert, no distress, on RA Sclera anicteric, throat clear  [name] or bruits Chest clear bilat to bases RRR no MRG Abd soft ntnd no mass or ascites +bs Ext R BKA, LLE w/ blistering skin lesions, 1+ edema  Neuro is alert, Ox 3 , nf    TDC intact in chest   Home bp meds: Coreg   Midodrine  2.5mg  every day prn    [facility] HD[facility] [place] [facility] From [facility] 2026 -> 4h  B350   53kg   TDC  Heparin  2400    Assessment/ Plan:  # ESRD - on HD MWF, missed OP HD Monday  - orders in for HD today   # Volume - SOB presenting c/o - +some leg edema - CXR +vasc congestion and IS edema  - max UF w/ HD today   # BP - BP's are not high, will d/c coreg  for now  # Anemia of esrd - Hb 6.6 here, getting prbc's this am   # PAD h/o R BKA   # anemia  - getting prbc's this am, per pmd  # LLE cellulitis - started on IV abx per pmd  # stage IV sacral decub  # chronic debility     Myer Fret  MD [facility] 10/28/2024, 10:46 AM  Recent Labs  Lab 10/23/24 1409 10/28/24 0023  HGB 7.1* 6.6*  CALCIUM  8.8* 8.9  CREATININE 6.55* 9.93*  K 3.7 4.8   Inpatient medications:  atorvastatin   40 mg Oral Daily   carvedilol   12.5 mg Oral BID WC   Chlorhexidine  Gluconate Cloth  6 each Topical Q0600   insulin  aspart  0-5 Units Subcutaneous QHS   insulin  aspart  0-9 Units Subcutaneous TID WC   isosorbide  mononitrate  30 mg Oral Daily   latanoprost   1 drop Both Eyes QHS   sodium bicarbonate   650 mg Oral BID   sodium chloride  flush  3 mL Intravenous Q12H    sodium chloride      linezolid  (ZYVOX ) IV Stopped (10/28/24 0846)   piperacillin -tazobactam (ZOSYN )  IV Stopped (10/28/24 0746)   sodium chloride , acetaminophen  **OR** acetaminophen , melatonin, midodrine , ondansetron  **OR** ondansetron  (ZOFRAN ) IV, sodium chloride  flush      [1]  Allergies Allergen Reactions   Nsaids Other (See Comments)    CKD stage 4   Lisinopril Cough   Peanut-Containing Drug Products Diarrhea, Itching and Other (See  Comments)    GI intolerance

## 2024-10-28 NOTE — ED Provider Notes (Signed)
 " [name] EMERGENCY DEPARTMENT AT [facility] Provider Note   CSN: [id number] Arrival date & time: 10/27/24  2320     Patient presents with: left foot wound   [name] is a 69 y.o. female.  Patient with history of hypertension, chronic atrial fibrillation on Eliquis , type II DM, chronic sacral wound, right sided BKA presents to the emergency department with 2 separate complaints.  Patient endorses shortness of breath which has been intermittent throughout the day today.  She attributes some of this to anxiety over potential surgery to reverse her colostomy.  Patient also endorses missing dialysis on Monday due to the weather.  She is supposed to have a wound VAC on her sacral ulcer but states the battery is dead and she had no power at her home.  She has concerns about possible infection in the left lower extremity.  Of note patient was seen earlier this month with plans to place into [facility] skilled nursing but patient eventually left AMA.  She states that she wishes to go to a facility at this time and that she does not want to go home.   HPI     Prior to Admission medications  Medication Sig Start Date End Date Taking? Authorizing Provider  ACCU-CHEK AVIVA PLUS test strip CHECK BLOOD GLUCOSE THREE TIMES DAILY 11/05/22   Joshua Domino, DO  acetaminophen  (TYLENOL ) 325 MG tablet Take 325 mg by mouth daily as needed for mild pain (pain score 1-3) or moderate pain (pain score 4-6).    [provider]  acetaminophen  (TYLENOL ) 500 MG tablet Take 2 tablets (1,000 mg total) by mouth 4 (four) times daily. Patient taking differently: Take 1,000 mg by mouth daily as needed for mild pain (pain score 1-3) or moderate pain (pain score 4-6). 06/09/24   Myrna Bitters, DO  albuterol  (PROVENTIL ) (2.5 MG/3ML) 0.083% nebulizer solution Take 3 mLs (2.5 mg total) by nebulization every 2 (two) hours as needed for wheezing. Patient not taking: Reported on 10/11/2024 11/25/23   Raenelle Donalda HERO, MD  apixaban  (ELIQUIS ) 2.5 MG TABS tablet Take 2.5 mg by mouth 2 (two) times daily.    [provider]  apixaban  (ELIQUIS ) 5 MG TABS tablet Take 1 tablet (5 mg total) by mouth 2 (two) times daily. Patient not taking: Reported on 10/11/2024 06/09/24   King, Olivia, DO  atorvastatin  (LIPITOR ) 40 MG tablet Take 1 tablet (40 mg total) by mouth daily. Patient not taking: Reported on 10/11/2024 10/06/24   Lue Elsie BROCKS, MD  brimonidine  (ALPHAGAN ) 0.2 % ophthalmic solution Place 1 drop into both eyes 2 (two) times daily.    [provider]  calcitRIOL  (ROCALTROL ) 0.25 MCG capsule TAKE 1 CAPSULE (0.25 MCG TOTAL) EVERY MONDAY, WEDNESDAY, AND FRIDAY Patient not taking: Reported on 10/11/2024 06/22/22   Christia Budds, MD  carvedilol  (COREG ) 12.5 MG tablet Take 12.5 mg by mouth 2 (two) times daily with a meal.    [provider]  collagenase  (SANTYL ) 250 UNIT/GM ointment Apply topically 2 (two) times daily. Patient not taking: Reported on 10/11/2024 06/09/24   King, Olivia, DO  Darbepoetin Alfa  (ARANESP ) 200 MCG/0.4ML SOSY injection Inject 0.4 mLs (200 mcg total) into the skin every Sunday at 6pm. 06/14/24   Myrna Bitters, DO  DROPLET INSULIN  SYRINGE 31G X 5/16 0.5 ML MISC USE FOUR TIMES DAILY FOR INJECTIONS 09/03/22   Joshua Domino, DO  Insulin  Syringes, Disposable, U-100 0.5 ML MISC 4x daily injections 03/21/18   Riccio, Angela C, DO  isosorbide  mononitrate (IMDUR ) 30 MG 24 hr tablet Take 30 mg by mouth daily.    [provider]  latanoprost  (XALATAN ) 0.005 % ophthalmic solution Place 1 drop into both eyes at bedtime.    [provider]  leptospermum manuka honey (MEDIHONEY) PSTE paste Apply 1 Application topically 2 (two) times daily. Apply with wound changes IF santyl  not available. Patient not taking: Reported on 10/11/2024 06/09/24   King, Olivia, DO  melatonin 5 MG TABS Take 10 mg by mouth at bedtime as needed (for insomnia).    [provider]  Methoxy PEG-Epoetin Beta (MIRCERA IJ) Inject 150 mcg into the vein every 14 (fourteen) days. 06/12/24 10/08/25  [provider]  midodrine  (PROAMATINE ) 2.5 MG tablet Take 2.5 mg by mouth daily as needed (low bp).    [provider]  Multiple Vitamin (MULTIVITAMIN WITH MINERALS) TABS Take 1 tablet by mouth daily.    [provider]  NEEDLE, DISP, 30 G (BD DISP NEEDLES) 30G X 1/2 MISC For 4x daily injections 11/05/15   Phelps, Jazma Y, DO  NOVOLIN R 100 UNIT/ML injection Inject 0-9 Units into the skin in the morning, at noon, and at bedtime. Per sliding scale 04/03/24   [provider]  nutrition supplement, JUVEN, (JUVEN) PACK Take 1 packet by mouth 2 (two) times daily between meals. Patient taking differently: Take 1 packet by mouth 3 (three) times a week. 06/09/24   King, Olivia, DO  zinc  sulfate, 50mg  elemental zinc , 220 (50 Zn) MG capsule Take 1 capsule (220 mg total) by mouth daily. 06/10/24   Myrna Bitters, DO    Allergies: Nsaids, Lisinopril, and Peanut-containing drug products    Review of Systems  Updated Vital Signs BP 130/78 (BP Location: Right Arm)   Pulse 79   Temp 98.8 F (37.1 C) (Oral)   Resp 16   Ht 5' 7 (1.702 m)   Wt 63.5 kg   SpO2 100%   BMI 21.93 kg/m   Physical Exam Vitals and nursing note reviewed.  Constitutional:      General: She is not in acute distress.    Appearance: She is well-developed.  HENT:     Head: Normocephalic and atraumatic.  Eyes:     Conjunctiva/sclera: Conjunctivae normal.  Cardiovascular:     Rate and Rhythm: Normal rate and regular rhythm.  Pulmonary:     Effort: Pulmonary effort is normal. No respiratory distress.     Breath sounds: Normal breath sounds.  Abdominal:     Palpations: Abdomen is soft.     Tenderness: There is no abdominal tenderness.  Musculoskeletal:        General: No swelling.     Cervical back: Neck supple.  Skin:    General: Skin is warm and dry.     Capillary Refill:  Capillary refill takes less than 2 seconds.     Comments: Sacral ulcer noted.  Appears to be stage IV See image of left lower extremity.  Skin breakdown, swelling, erythema, warmth  Neurological:     Mental Status: She is alert and oriented to person, place, and time.  Psychiatric:        Mood and Affect: Mood normal.     (all labs ordered are listed, but only abnormal results are displayed) Labs Reviewed  BASIC METABOLIC PANEL WITH GFR - Abnormal; Notable for the following components:      Result Value   Sodium 130 (*)    Chloride 94 (*)    CO2  20 (*)    Glucose, Bld 232 (*)    BUN 66 (*)    Creatinine, Ser 9.93 (*)    GFR, Estimated 4 (*)    Anion gap 17 (*)    All other components within normal limits  CBC WITH DIFFERENTIAL/PLATELET - Abnormal; Notable for the following components:   WBC 21.0 (*)    RBC 2.59 (*)    Hemoglobin 6.6 (*)    HCT 21.3 (*)    MCH 25.5 (*)    RDW 21.2 (*)    Platelets 736 (*)    Neutro Abs 18.1 (*)    Monocytes Absolute 1.2 (*)    Abs Immature Granulocytes 0.24 (*)    All other components within normal limits  POC OCCULT BLOOD, ED - Normal  CULTURE, BLOOD (ROUTINE X 2)  CULTURE, BLOOD (ROUTINE X 2)  POC OCCULT BLOOD, ED  PREPARE RBC (CROSSMATCH)  TYPE AND SCREEN    EKG: None  Radiology: DG Foot Complete Left Result Date: 10/28/2024 EXAM: 3 VIEW(S) XRAY OF THE LEFT FOOT 10/28/2024 02:18:00 AM COMPARISON: 10/23/2024 CLINICAL HISTORY: Concern for infection. FINDINGS: BONES AND JOINTS: No acute fracture. No malalignment. Small inferior calcaneal spur. SOFT TISSUES: Improved soft tissue swelling of the forefoot since prior study. IMPRESSION: 1. Improved soft tissue swelling of the forefoot since prior study. 2. No acute osseous findings. Electronically signed by: Greig Pique MD 10/28/2024 02:22 AM EST RP Workstation: [id number]     .Critical Care  Performed by: Logan Ubaldo NOVAK, PA-C Authorized by: Logan Ubaldo NOVAK, PA-C   Critical  care provider statement:    Critical care time (minutes):  40   Critical care time was exclusive of:  Separately billable procedures and treating other patients   Critical care was necessary to treat or prevent imminent or life-threatening deterioration of the following conditions: Symptomatic anemia requiring transfusion.   Critical care was time spent personally by me on the following activities:  Development of treatment plan with patient or surrogate, discussions with consultants, evaluation of patient's response to treatment, examination of patient, ordering and review of laboratory studies, ordering and review of radiographic studies, ordering and performing treatments and interventions, pulse oximetry, re-evaluation of patient's condition and review of old charts   Care discussed with: admitting provider      Medications Ordered in the ED  0.9 %  sodium chloride  infusion (Manually program via Guardrails IV Fluids) (has no administration in time range)  ceFAZolin  (ANCEF ) IVPB 2g/100 mL premix (0 g Intravenous Stopped 10/28/24 0351)                                    Medical Decision Making Amount and/or Complexity of Data Reviewed Labs: ordered. Radiology: ordered.  Risk Prescription drug management. Decision regarding hospitalization.   This patient presents to the ED for concern of shortness of breath, this involves an extensive number of treatment options, and is a complaint that carries with it a high risk of complications and morbidity.  The differential diagnosis includes symptomatic anemia, pneumonia, ACS, others.  Patient also complains of possible left leg infection with differential including osteomyelitis, cellulitis, and others   Co morbidities / Chronic conditions that complicate the patient evaluation  As noted in HPI   Additional history obtained:  Additional history obtained from EMR External records from outside source obtained and reviewed including  discharge summary from January 13 after patient was admitted due  to acute stroke   Lab Tests:  I Ordered, and personally interpreted labs.  The pertinent results include: Hemoglobin 6.6, leukocytosis with a white count of 21,000, negative fecal occult blood test   Imaging Studies ordered:  I ordered imaging studies including plain films of the left foot I independently visualized and interpreted imaging which showed  1. Improved soft tissue swelling of the forefoot since prior study.  2. No acute osseous findings.   I agree with the radiologist interpretation   Problem List / ED Course / Critical interventions / Medication management   I ordered medication including ancef , 1 unit PRBC   Reevaluation of the patient after these medicines showed that the patient stayed the same   Consultations Obtained:  I requested consultation with the hospitalist, Dr.Sundil,  and discussed lab and imaging findings as well as pertinent plan - they recommend: admission  Test / Admission - Considered:  Patient with symptomatic anemia with negative fecal occult and shortness of breath. Patient being treated with 1 unit PRBC. Patient also has evidence of cellulitis of the left lower extremity. IV antibiotics initiated. Patient will need admission for continued management.       Final diagnoses:  Symptomatic anemia  Cellulitis of left leg    ED Discharge Orders     None          Logan Ubaldo KATHEE DEVONNA 10/28/24 0518  "

## 2024-10-28 NOTE — Progress Notes (Signed)
 Pt receives out-pt HD at [facility] on MWF 10:50 am chair time. Will assist as needed.   Randine Mungo Dialysis Navigator [phone]

## 2024-10-28 NOTE — ED Notes (Signed)
 CCMD called; Pt is on monitoring

## 2024-10-28 NOTE — ED Notes (Signed)
 MD at [facility]

## 2024-10-28 NOTE — ED Notes (Signed)
Wound nurse at [facility].

## 2024-10-28 NOTE — Evaluation (Signed)
 Physical Therapy Evaluation Patient Details Name: [name] MRN: [id number] DOB: [birth year] Today's Date: 10/28/2024  History of Present Illness  Pt is 69 year old presented to [facility] on  10/27/24 for lt foot wound. Pt with multiple recent hospitalizations and declined SNF placement at those times. Pt now agreeable to SNF.   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  Clinical Impression  Pt admitted with above diagnosis and presents to PT with functional limitations due to deficits listed below (See PT problem list). Pt needs skilled PT to maximize independence and safety. Pt with multiple recent hospitalizations and has had decline in functional mobility.  Patient will benefit from continued inpatient follow up therapy, <3 hours/day which she is agreeable to at this time.        If plan is discharge home, recommend the following: A lot of help with walking and/or transfers;A lot of help with bathing/dressing/bathroom;Assist for transportation;Assistance with cooking/housework   Can travel by private vehicle   No    Equipment Recommendations Chief Of Staff for Other Services       Functional Status Assessment Patient has had a recent decline in their functional status and demonstrates the ability to make significant improvements in function in a reasonable and predictable amount of time.     Precautions / Restrictions Precautions Precautions: Fall Recall of Precautions/Restrictions: Impaired Precaution/Restrictions Comments: Pt reports she is legally blind; R prosthesis Restrictions Weight Bearing Restrictions Per Provider Order: No      Mobility  Bed Mobility Overal bed mobility: Needs Assistance Bed Mobility: Sidelying to Sit, Sit to Sidelying   Sidelying to sit: Mod assist, HOB elevated     Sit to sidelying: Min assist,  HOB elevated General bed mobility comments: Assist to elevate trunk into sitting. Assist to guide LLE up onto stretcher returning to sidelying    Transfers                   General transfer comment: Did not attempt from high ED stretcher and without prosthetic    Ambulation/Gait                  Stairs            Wheelchair Mobility     Tilt Bed    Modified Rankin (Stroke Patients Only)       Balance Overall balance assessment: Needs assistance Sitting-balance support: Bilateral upper extremity supported, Feet unsupported Sitting balance-Leahy Scale: Poor Sitting balance - Comments: Edge of stretcher                                     Pertinent Vitals/Pain Pain Assessment Pain Assessment: No/denies pain    Home Living Family/patient expects to be discharged to:: Private residence Living Arrangements: Children (sons) Available Help at Discharge: Family;Available 24 hours/day Type of Home: House Home Access: Level entry     Alternate Level Stairs-Number of Steps: 5 Home Layout: Multi-level;Able to live on main level with bedroom/bathroom Home Equipment: [place] - single point;Wheelchair - Forensic Psychologist (2 wheels);Shower seat;BSC/3in1;Lift chair;Hospital bed;Wheelchair - power      Prior Function Prior Level of Function : Needs assist       Physical Assist : Mobility (physical);ADLs (physical) Mobility (physical): Bed mobility;Transfers ADLs (physical): Bathing;Dressing;Toileting;IADLs Mobility Comments:  Pt reports she has not been able to amb after recentl hospital admissions. Reports heavy assist from son for transfer to w/c. Reports she hasn't been using her rt prosthetic.       Extremity/Trunk Assessment   Upper Extremity Assessment Upper Extremity Assessment: Defer to OT evaluation    Lower Extremity Assessment Lower Extremity Assessment: RLE deficits/detail;LLE deficits/detail;Generalized weakness RLE  Deficits / Details: BKA without prosthetic currently available LLE Deficits / Details: Significant wound on dorsum of foot/ankle/lower leg    Cervical / Trunk Assessment Cervical / Trunk Assessment: Other exceptions Cervical / Trunk Exceptions: incr body habitus  Communication   Communication Communication: No apparent difficulties    Cognition Arousal: Alert Behavior During Therapy: WFL for tasks assessed/performed   PT - Cognitive impairments: No family/caregiver present to determine baseline                         Following commands: Intact       Cueing Cueing Techniques: Verbal cues, Tactile cues     General Comments General comments (skin integrity, edema, etc.): VSS on RA    Exercises     Assessment/Plan    PT Assessment Patient needs continued PT services  PT Problem List Decreased strength;Decreased balance;Decreased activity tolerance;Decreased mobility       PT Treatment Interventions DME instruction;Functional mobility training;Therapeutic activities;Therapeutic exercise;Balance training;Patient/family education    PT Goals (Current goals can be found in the Care Plan section)  Acute Rehab PT Goals Patient Stated Goal: go to rehab to get stronger PT Goal Formulation: With patient Time For Goal Achievement: 11/11/24 Potential to Achieve Goals: Fair    Frequency Min 1X/week     Co-evaluation               AM-PAC PT 6 Clicks Mobility  Outcome Measure Help needed turning from your back to your side while in a flat bed without using bedrails?: A Lot Help needed moving from lying on your back to sitting on the side of a flat bed without using bedrails?: A Lot Help needed moving to and from a bed to a chair (including a wheelchair)?: Total Help needed standing up from a chair using your arms (e.g., wheelchair or bedside chair)?: Total Help needed to walk in hospital room?: Total Help needed climbing 3-5 steps with a railing? : Total 6  Click Score: 8    End of Session   Activity Tolerance: Patient limited by fatigue Patient left: in bed;with call bell/phone within reach Nurse Communication: Mobility status PT Visit Diagnosis: Other abnormalities of gait and mobility (R26.89);Muscle weakness (generalized) (M62.81);Difficulty in walking, not elsewhere classified (R26.2)    Time: 8889-8874 PT Time Calculation (min) (ACUTE ONLY): 15 min   Charges:   PT Evaluation $PT Eval Moderate Complexity: 1 Mod   PT General Charges $$ ACUTE PT VISIT: 1 Visit         [facility] PT Acute Rehabilitation Services Office [phone]   Rodgers ORN [facility] 10/28/2024, 11:49 AM

## 2024-10-28 NOTE — ED Notes (Signed)
 Report given to Dialysis RN

## 2024-10-29 ENCOUNTER — Encounter (HOSPITAL_BASED_OUTPATIENT_CLINIC_OR_DEPARTMENT_OTHER): Admitting: General Surgery

## 2024-10-29 LAB — BASIC METABOLIC PANEL WITH GFR
Anion gap: 13 (ref 5–15)
BUN: 56 mg/dL — ABNORMAL HIGH (ref 8–23)
CO2: 21 mmol/L — ABNORMAL LOW (ref 22–32)
Calcium: 8.1 mg/dL — ABNORMAL LOW (ref 8.9–10.3)
Chloride: 93 mmol/L — ABNORMAL LOW (ref 98–111)
Creatinine, Ser: 8.8 mg/dL — ABNORMAL HIGH (ref 0.44–1.00)
GFR, Estimated: 4 mL/min — ABNORMAL LOW
Glucose, Bld: 250 mg/dL — ABNORMAL HIGH (ref 70–99)
Potassium: 4.3 mmol/L (ref 3.5–5.1)
Sodium: 127 mmol/L — ABNORMAL LOW (ref 135–145)

## 2024-10-29 LAB — BPAM RBC
Blood Product Expiration Date: 202602272359
ISSUE DATE / TIME: 202602040547
Unit Type and Rh: 6200

## 2024-10-29 LAB — TYPE AND SCREEN
ABO/RH(D): A POS
Antibody Screen: NEGATIVE
Unit division: 0

## 2024-10-29 LAB — GLUCOSE, CAPILLARY
Glucose-Capillary: 232 mg/dL — ABNORMAL HIGH (ref 70–99)
Glucose-Capillary: 140 mg/dL — ABNORMAL HIGH (ref 70–99)
Glucose-Capillary: 229 mg/dL — ABNORMAL HIGH (ref 70–99)
Glucose-Capillary: 230 mg/dL — ABNORMAL HIGH (ref 70–99)

## 2024-10-29 LAB — CBC
HCT: 24.5 % — ABNORMAL LOW (ref 36.0–46.0)
Hemoglobin: 7.8 g/dL — ABNORMAL LOW (ref 12.0–15.0)
MCH: 25.5 pg — ABNORMAL LOW (ref 26.0–34.0)
MCHC: 31.8 g/dL (ref 30.0–36.0)
MCV: 80.1 fL (ref 80.0–100.0)
Platelets: 585 10*3/uL — ABNORMAL HIGH (ref 150–400)
RBC: 3.06 MIL/uL — ABNORMAL LOW (ref 3.87–5.11)
RDW: 20.1 % — ABNORMAL HIGH (ref 11.5–15.5)
WBC: 14.3 10*3/uL — ABNORMAL HIGH (ref 4.0–10.5)
nRBC: 0 % (ref 0.0–0.2)

## 2024-10-29 LAB — MRSA NEXT GEN BY PCR, NASAL: MRSA by PCR Next Gen: NOT DETECTED

## 2024-10-29 MED ORDER — PENTAFLUOROPROP-TETRAFLUOROETH EX AERO: 1.0000 | INHALATION_SPRAY | CUTANEOUS | Status: DC | PRN

## 2024-10-29 MED ORDER — HEPARIN SODIUM (PORCINE) 1000 UNIT/ML DIALYSIS: 1000.0000 [IU] | INTRAMUSCULAR | Status: DC | PRN

## 2024-10-29 MED ORDER — LIDOCAINE HCL (PF) 1 % IJ SOLN: 5.0000 mL | INTRAMUSCULAR | Status: DC | PRN

## 2024-10-29 MED ORDER — NEPRO/CARBSTEADY PO LIQD: 237.0000 mL | ORAL | Status: DC | PRN

## 2024-10-29 MED ORDER — HEPARIN SODIUM (PORCINE) 1000 UNIT/ML DIALYSIS: 2500.0000 [IU] | Freq: Once | INTRAMUSCULAR | Status: DC

## 2024-10-29 MED ORDER — LIDOCAINE-PRILOCAINE 2.5-2.5 % EX CREA: 1.0000 | TOPICAL_CREAM | CUTANEOUS | Status: DC | PRN

## 2024-10-29 MED ORDER — CHLORHEXIDINE GLUCONATE CLOTH 2 % EX PADS: 6.0000 | MEDICATED_PAD | Freq: Every day | CUTANEOUS | Status: DC

## 2024-10-29 MED ORDER — HEPARIN SODIUM (PORCINE) 1000 UNIT/ML IJ SOLN
INTRAMUSCULAR | Status: AC
  Filled 2024-10-29: qty 4

## 2024-10-29 MED ORDER — TRAMADOL HCL 50 MG PO TABS
50.0000 mg | ORAL_TABLET | Freq: Three times a day (TID) | ORAL | Status: AC
  Administered 2024-10-29 – 2024-10-30 (×5): 50 mg via ORAL
  Filled 2024-10-29 (×5): qty 1

## 2024-10-29 MED ORDER — ANTICOAGULANT SODIUM CITRATE 4% (200MG/5ML) IV SOLN: 5.0000 mL | Status: DC | PRN

## 2024-10-29 MED ORDER — ALTEPLASE 2 MG IJ SOLR: 2.0000 mg | Freq: Once | INTRAMUSCULAR | Status: DC | PRN

## 2024-10-29 NOTE — Progress Notes (Signed)
 "  [facility] Progress Note   Subjective:    Seen and examined patient on HD. Discussed with the HD RN. TDC is running well currently. I was informed machine alarms when she moves around in the bed. Cath-flo was dwelled overnight. Tolerating UFG 3L and BP is 120/59.  Objective Vitals:   10/29/24 0930 10/29/24 0941 10/29/24 0956 10/29/24 1026  BP: 98/64 (!) 120/59 (!) 124/52 (!) 125/55  Pulse: 72 78 73 75  Resp: 20 17 16 17   Temp:      TempSrc:      SpO2: 99% 100% 100% 100%  Weight:      Height:       Physical Exam General: Sleeping, NAD Heart: S1 and S2; No murmurs, gallops, or rubs Lungs: Coarse RUL Abdomen: Soft and non-tender Extremities: R BKA, LLE w/ blistering skin lesions, 1+ edema  Dialysis Access: [facility]   Filed Weights   10/27/24 2349 10/28/24 1742  Weight: 63.5 kg 88.8 kg    Intake/Output Summary (Last 24 hours) at 10/29/2024 1043 Last data filed at 10/29/2024 0540 Gross per 24 hour  Intake 959.86 ml  Output 350 ml  Net 609.86 ml    Additional Objective Labs: Basic Metabolic Panel: Recent Labs  Lab 10/23/24 1409 10/28/24 0023 10/29/24 0302  NA 133* 130* 127*  K 3.7 4.8 4.3  CL 95* 94* 93*  CO2 27 20* 21*  GLUCOSE 307* 232* 250*  BUN 41* 66* 56*  CREATININE 6.55* 9.93* 8.80*  CALCIUM  8.8* 8.9 8.1*   Liver Function Tests: No results for input(s): AST, ALT, ALKPHOS, BILITOT, PROT, ALBUMIN  in the last 168 hours. No results for input(s): LIPASE, AMYLASE in the last 168 hours. CBC: Recent Labs  Lab 10/23/24 1409 10/28/24 0023 10/29/24 0302  WBC 15.2* 21.0* 14.3*  NEUTROABS 12.4* 18.1*  --   HGB 7.1* 6.6* 7.8*  HCT 23.6* 21.3* 24.5*  MCV 82.2 82.2 80.1  PLT 533* 736* 585*   Blood Culture    Component Value Date/Time   SDES BLOOD RIGHT HAND 10/28/2024 0547   SPECREQUEST  10/28/2024 0547    BOTTLES DRAWN AEROBIC AND ANAEROBIC Blood Culture adequate volume   CULT  10/28/2024 0547    NO GROWTH 1 DAY Performed at  [facility], [address], [place], [place] [address]    REPTSTATUS PENDING 10/28/2024 [phone]    Cardiac Enzymes: No results for input(s): CKTOTAL, CKMB, CKMBINDEX, TROPONINI in the last 168 hours. CBG: Recent Labs  Lab 10/28/24 0712 10/28/24 1228 10/28/24 1752 10/28/24 2021 10/29/24 0736  GLUCAP 252* 338* 264* 247* 232*   Iron  Studies: No results for input(s): IRON , TIBC, TRANSFERRIN, FERRITIN in the last 72 hours. Lab Results  Component Value Date   INR 1.1 04/19/2024   INR >10.0 (HH) 10/28/2023   INR 1.32 02/13/2013   Studies/Results: DG CHEST PORT 1 VIEW Result Date: 10/28/2024 CLINICAL DATA:  Shortness of breath. EXAM: PORTABLE CHEST 1 VIEW COMPARISON:  10/09/2024 FINDINGS: Low lung volumes. Cardiopericardial silhouette is at upper limits of normal for size. Vascular congestion noted with likely component of underlying interstitial edema. No focal airspace consolidation. No substantial pleural effusion. Left IJ central line remains in place with tip overlying the mid SVC level. IMPRESSION: Low volume film with vascular congestion and likely component of underlying interstitial edema. Electronically Signed   By: Camellia Candle M.D.   On: 10/28/2024 05:52   DG Foot Complete Left Result Date: 10/28/2024 EXAM: 3 VIEW(S) XRAY OF THE LEFT FOOT 10/28/2024  02:18:00 AM COMPARISON: 10/23/2024 CLINICAL HISTORY: Concern for infection. FINDINGS: BONES AND JOINTS: No acute fracture. No malalignment. Small inferior calcaneal spur. SOFT TISSUES: Improved soft tissue swelling of the forefoot since prior study. IMPRESSION: 1. Improved soft tissue swelling of the forefoot since prior study. 2. No acute osseous findings. Electronically signed by: Greig Pique MD 10/28/2024 02:22 AM EST RP Workstation: [id number]    Medications:  anticoagulant sodium citrate      linezolid  (ZYVOX ) IV 600 mg (10/28/24 2158)   piperacillin -tazobactam (ZOSYN )  IV 2.25 g (10/29/24 0820)     atorvastatin   40 mg Oral Daily   Chlorhexidine  Gluconate Cloth  6 each Topical Q0600   Chlorhexidine  Gluconate Cloth  6 each Topical Q0600   heparin   2,500 Units Dialysis Once in dialysis   insulin  aspart  0-5 Units Subcutaneous QHS   insulin  aspart  0-9 Units Subcutaneous TID WC   isosorbide  mononitrate  30 mg Oral Daily   latanoprost   1 drop Both Eyes QHS   silver  sulfADIAZINE    Topical BID   sodium bicarbonate   650 mg Oral BID   sodium chloride  flush  3 mL Intravenous Q12H    Dialysis Orders: [facility] From jan 2026 -> 4h  B350   53kg   TDC  Heparin  2400  Assessment/Plan: # ESRD - on HD MWF, missed OP HD Monday  - on HD for an extra treatment. Next HD tomorrow per her usual schedule  # Vascular Access - dwelled [facility] with cath-flo overnight - TDC running okay so far and machine alarms when patient is moving in the bed - Monitor closely   # Volume - SOB presenting c/o - +some leg edema - CXR +vasc congestion and IS edema  - max UF w/ HD today    # BP - BP's are not high, will d/c coreg  for now   # Anemia of esrd - Hb 6.6 here, getting prbc's this am    # PAD h/o R BKA    # anemia  - getting prbc's this am, per pmd   # LLE cellulitis - started on IV abx per pmd   # stage IV sacral decub   # chronic debility   Charmaine Piety, NP [facility] 10/29/2024,10:43 AM  LOS: 1 day    "

## 2024-10-29 NOTE — NC FL2 (Cosign Needed)
 " [place]  MEDICAID FL2 LEVEL OF CARE FORM     IDENTIFICATION  Patient Name: [name] Birthdate: 01-13-1956 Sex: female Admission Date (Current Location): 10/27/2024  [facility] and [place] Number:  Producer, Television/film/video and Address:  [facility], [address] [place], [place] [address]      Provider Number: [id number]  Attending Physician Name and Address:  Dorinda Drue DASEN, MD  Relative Name and Phone Number:  [name] Neighbors [phone]    Current Level of Care: Hospital Recommended Level of Care: Skilled Nursing Facility Prior Approval Number:    Date Approved/Denied:   PASRR Number: [id number]  Discharge Plan: SNF    Current Diagnoses: Patient Active Problem List   Diagnosis Date Noted   Pressure injury of skin 10/28/2024   Cellulitis of left lower extremity 10/28/2024   Thrombocytosis 10/28/2024   Stage IV pressure ulcer of sacral region (HCC) 10/28/2024   History of right below knee amputation (HCC) 10/28/2024   Paroxysmal atrial fibrillation (HCC) 10/28/2024   Anemia of chronic disease 10/28/2024   CVA (cerebral vascular accident) (HCC) 10/06/2024   Non-healing wound of amputation stump (HCC) 06/24/2024   Wound dehiscence 06/04/2024   Severe protein-calorie malnutrition 06/03/2024   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) 05/30/2024   Hypoalbuminemia due to protein-calorie malnutrition 05/30/2024   Osteomyelitis of vertebra, sacral and sacrococcygeal region (HCC) 05/29/2024   Primary hypercoagulable state 05/29/2024   BKA stump complication (HCC) 05/29/2024   Acute on chronic anemia 05/29/2024   Does not feel safe at home 05/29/2024   Colostomy in place [facility]) 05/29/2024   Diabetes mellitus with kidney complication, with long-term current use of insulin  (HCC) 05/29/2024   Osteomyelitis (HCC) 04/20/2024   MRSA bacteremia 04/20/2024   Bacteremia 04/20/2024   Necrotizing fasciitis (HCC) 04/20/2024   ESRD needing dialysis (HCC)  04/20/2024   Irritant contact dermatitis associated with fecal stoma 12/27/2023   Colostomy complication, unspecified (HCC) 12/27/2023   Diverticulitis of large intestine with complication 10/27/2023   Foot callus 02/27/2021   Counseled about COVID-19 virus infection 02/24/2020   Aortic heart murmur 05/15/2019   Thyroid  enlargement 07/19/2014   Vitamin D  deficiency 09/03/2013   Hyperlipidemia 04/24/2013   Diabetic retinopathy (HCC) 01/23/2013   Chronic atrial fibrillation (HCC) 01/15/2013   Aortic atherosclerosis 01/11/2013   H/O non-ST elevation myocardial infarction (NSTEMI) 01/09/2013   History of CVA (cerebrovascular accident) 01/08/2013   Diabetic neuropathy 01/08/2013   Hypertension    Diabetes mellitus     Orientation RESPIRATION BLADDER Height & Weight     Self, Time, Situation, Place  Normal Continent Weight: 195 lb 12.3 oz (88.8 kg) Height:  5' 7 (170.2 cm)  BEHAVIORAL SYMPTOMS/MOOD NEUROLOGICAL BOWEL NUTRITION STATUS      Continent Diet (see dc summary)  AMBULATORY STATUS COMMUNICATION OF NEEDS Skin    (No data given) Verbally Other (Comment) (See LDA)                       Personal Care Assistance Level of Assistance  Bathing, Feeding, Dressing Bathing Assistance:  (ADL either performed or assessed with clinical judgement per OT note) Feeding assistance:  (ADL either performed or assessed with clinical judgement per OT note) Dressing Assistance:  (ADL either performed or assessed with clinical judgement per OT note)     Functional Limitations Info    Sight Info: Impaired Hearing Info: Adequate Speech Info: Adequate    SPECIAL CARE FACTORS FREQUENCY  PT (  By licensed PT), OT (By licensed OT)     PT Frequency: 5x/wk OT Frequency: 5x/wk            Contractures Contractures Info: Not present    Additional Factors Info  Code Status, Allergies Code Status Info: FULL Allergies Info: Nsaids  Lisinopril  Peanut-containing Drug Products            Current Medications (10/29/2024):  This is the current hospital active medication list Current Facility-Administered Medications  Medication Dose Route Frequency Provider Last Rate Last Admin   acetaminophen  (TYLENOL ) tablet 650 mg  650 mg Oral Q6H PRN Sundil, Subrina, MD   650 mg at 10/29/24 1458   Or   acetaminophen  (TYLENOL ) suppository 650 mg  650 mg Rectal Q6H PRN Sundil, Subrina, MD       atorvastatin  (LIPITOR ) tablet 40 mg  40 mg Oral Daily Sundil, Subrina, MD   40 mg at 10/28/24 0911   Chlorhexidine  Gluconate Cloth 2 % PADS 6 each  6 each Topical Q0600 Tobie Gordy POUR, MD   6 each at 10/29/24 0615   Chlorhexidine  Gluconate Cloth 2 % PADS 6 each  6 each Topical Q0600 Geralynn Charleston, MD       insulin  aspart (novoLOG ) injection 0-5 Units  0-5 Units Subcutaneous QHS Sundil, Subrina, MD   2 Units at 10/28/24 2205   insulin  aspart (novoLOG ) injection 0-9 Units  0-9 Units Subcutaneous TID WC Sundil, Subrina, MD   1 Units at 10/29/24 1458   isosorbide  mononitrate (IMDUR ) 24 hr tablet 30 mg  30 mg Oral Daily Sundil, Subrina, MD   30 mg at 10/29/24 1458   latanoprost  (XALATAN ) 0.005 % ophthalmic solution 1 drop  1 drop Both Eyes QHS Sundil, Subrina, MD   1 drop at 10/28/24 2154   linezolid  (ZYVOX ) IVPB 600 mg  600 mg Intravenous Q12H Sundil, Subrina, MD 300 mL/hr at 10/29/24 1506 600 mg at 10/29/24 1506   melatonin tablet 10 mg  10 mg Oral QHS PRN Sundil, Subrina, MD   10 mg at 10/28/24 2154   midodrine  (PROAMATINE ) tablet 2.5 mg  2.5 mg Oral Daily PRN Sundil, Subrina, MD       ondansetron  (ZOFRAN ) tablet 4 mg  4 mg Oral Q6H PRN Sundil, Subrina, MD       Or   ondansetron  (ZOFRAN ) injection 4 mg  4 mg Intravenous Q6H PRN Sundil, Subrina, MD       piperacillin -tazobactam (ZOSYN ) IVPB 2.25 g  2.25 g Intravenous Q8H Laron Agent, RPH 100 mL/hr at 10/29/24 0820 2.25 g at 10/29/24 0820   silver  sulfADIAZINE  (SILVADENE ) 1 % cream   Topical BID Dorinda Drue DASEN, MD   Given at 10/28/24 2155   sodium  bicarbonate tablet 650 mg  650 mg Oral BID Sundil, Subrina, MD   650 mg at 10/29/24 1458   sodium chloride  flush (NS) 0.9 % injection 3 mL  3 mL Intravenous Q12H Sundil, Subrina, MD   3 mL at 10/29/24 0800   sodium chloride  flush (NS) 0.9 % injection 3 mL  3 mL Intravenous PRN Sundil, Subrina, MD         Discharge Medications: Please see discharge summary for a list of discharge medications.  Relevant Imaging Results:  Relevant Lab Results:   Additional Information HD at [facility] [place] on MWF 10:50 am chair time  [place], [place]     "

## 2024-10-29 NOTE — Progress Notes (Signed)
 This chaplain responded to unit request for creating the Pt. Advance Directive. The chaplain provided AD education with the Pt. and Pt. Son-[name]. The chaplain understands the Pt. plans to name [name] as HCPOA. The chaplain left an incomplete AD for the Pt. to review. Spiritual care will F/U on Friday.  Chaplain Leeroy Hummer [phone]

## 2024-10-29 NOTE — Plan of Care (Signed)
   Problem: Coping: Goal: Ability to adjust to condition or change in health will improve Outcome: Progressing   Problem: Fluid Volume: Goal: Ability to maintain a balanced intake and output will improve Outcome: Progressing   Problem: Metabolic: Goal: Ability to maintain appropriate glucose levels will improve Outcome: Progressing

## 2024-10-29 NOTE — Progress Notes (Signed)
 " Progress Note   Patient: [name] FMW:[id number] DOB: 31-May-1956 DOA: 10/27/2024     1 DOS: the patient was seen and examined on 10/29/2024     Brief hospital course: From HPI [name] is a 69 y.o. 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 Patient also endorsing needs dialysis on Monday due to adverse weather. Patient supposed to have wound VAC placement of the sacral area however stated that battery is dead and she does not have power at home.   Also complaining about concern for possible infection of the left lower extremity. Of note patient was seen emergency department multiple times plan to place into [facility] skilled nursing but patient eventually left AMA. She states that she wishes to go to a facility at this time in a state of discharging to home. Hospitalist consulted for further evaluation management of left lower extremity cellulitis, acute on chronic anemia, thrombocytosis, wound VAC placement of the sacral wound and volume overload with need for dialysis. .   Assessment and Plan: Acute on chronic anemia Symptomatic anemia Anemia of chronic disease due to ESRD Nephrologist on board for dialysis needs Status post 1 unit of blood transfusion Monitor hemoglobin closely No evidence of fasting no GI bleed     Left lower extremity cellulitis -Left lower extremity chronic wound and cellulitis with skin disruption from chronic pressure injury and developing of ulcer. -  X-ray of the left foot showed improved soft tissue swelling of the forefoot since prior study.  No osseous finding. - Blood cultures has been obtained.  Patient is afebrile.  Showing leukocytosis.   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 - Need to follow-up with blood culture result. Wound care on board we appreciate input   Sacral wound-history of chronic wound VAC Stage IV ulcer of the sacral area -Patient reported she has chronic wound VAC of the sacral area however the battery ran out and she is requesting for new wound VAC placement. -Consulted inpatient wound care for wound VAC replacement or battery replacement.   Thrombocytosis -Elevated platelet count 736.  Baseline around above 500.  Concern for reactive thrombocytosis in the setting of acute infectious/inflammatory process.   Continue to monitor platelet count.   Paroxysmal atrial fibrillation -Continue Coreg . - Holding Eliquis  in the setting of low hemoglobin.    History of CVA -Continue Lipitor .   Eliquis  on hold in the setting of acute anemia.   History of necrotizing fasciitis osteomyelitis of the right leg status post below-knee amputation History of perforated diverticulitis in 2025 No acute intervention at this time   ESRD on dialysis MWF schedule Nephrologist on board for dialysis needs Follow-up on renal function   Anion gap metabolic acidosis-secondary to ESRD Continue oral bicarb 650 mg twice daily.   Insulin -dependent DM type II Continue insulin  therapy Monitor glucose levels   Chronic physical debility and weakness from chronic underlying chronic medical health problems Have been seen by PT OT with recommendation for SNF     DVT prophylaxis:  SCDs   Code Status:  Full Code   Diet: Renal and carb modified   Family Communication: No family present at bedside however I discussed the case with them over the  phone. Disposition Plan: SNF when medically stable   Consults: PT, OT, case management and nephrology   Subjective:  Seen and examined at bedside this morning Respiratory function improving Denies nausea vomiting chest pain   Physical Exam: Constitutional:      Appearance: She is ill-appearing.  HENT:     Mouth/Throat:     Mouth: Mucous membranes are moist.  Eyes:     Pupils: Pupils are equal, round, and reactive to light.  Cardiovascular:     Rate and Rhythm: Normal rate and regular rhythm.     Pulses: Normal pulses.     Heart sounds: Normal heart sounds.  Pulmonary:     Effort: Pulmonary effort is normal.     Breath sounds: Normal breath sounds.  Abdominal:     General: Bowel sounds are normal.  Musculoskeletal:     Cervical back: Neck supple.     Right lower leg: Right below-knee amputation    Left lower leg: No edema.     Comments: Left lower extremity wound  Skin:    Capillary Refill: Capillary refill takes less than 2 seconds.  Neurological:     Mental Status: She is alert and oriented to person, place, and time.  Psychiatric:        Mood and Affect: Mood normal.     Data Reviewed:    Latest Ref Rng & Units 10/29/2024    3:02 AM 10/28/2024   12:23 AM 10/23/2024    2:09 PM  CBC  WBC 4.0 - 10.5 K/uL 14.3  21.0  15.2   Hemoglobin 12.0 - 15.0 g/dL 7.8  6.6  7.1   Hematocrit 36.0 - 46.0 % 24.5  21.3  23.6   Platelets 150 - 400 K/uL 585  736  533        Latest Ref Rng & Units 10/29/2024    3:02 AM 10/28/2024   12:23 AM 10/23/2024    2:09 PM  BMP  Glucose 70 - 99 mg/dL 749  767  692   BUN 8 - 23 mg/dL 56  66  41   Creatinine 0.44 - 1.00 mg/dL 1.19  0.06  3.44   Sodium 135 - 145 mmol/L 127  130  133   Potassium 3.5 - 5.1 mmol/L 4.3  4.8  3.7   Chloride 98 - 111 mmol/L 93  94  95   CO2 22 - 32 mmol/L 21  20  27    Calcium  8.9 - 10.3 mg/dL 8.1  8.9  8.8      Vitals:   10/29/24 1350 10/29/24 1422 10/29/24 1643 10/29/24 1647  BP: (!) 130/59 (!) 136/59 (!) 76/35  (!) 87/69  Pulse: 77 80 80 84  Resp: (!) 24 16 18 18   Temp: 98 F (36.7 C) 98.2 F (36.8 C) 98.2 F (36.8 C)   TempSrc:  Oral    SpO2: 97% 100% 97%   Weight:      Height:         Author: Drue ONEIDA Potter, MD 10/29/2024 5:10 PM  For on call review [email or web address].  "

## 2024-10-29 NOTE — TOC Initial Note (Signed)
 Transition of Care [facility]) - Initial/Assessment Note    Patient Details  Name: [name] MRN: [id number] Date of Birth: [birth year]  [facility]) CM/SW Contact:    Lendia Dais, LCSWA Phone Number: 10/29/2024, 3:18 PM  Clinical Narrative:  Pt is from home and CSW spoke to the pt with their son [name] at bedside about SNF recommendations from PT. Pt requested [facility] due to proximity, pt also had a preference of [facility] and then [facility] as a third choice. Pt was agreeable to CSW sending out referrals to other facilities.  Referrals have been sent out in the hub and bed offers are pending.  CSW will continue to follow for bed offers.                 Expected Discharge Plan: Skilled Nursing Facility Barriers to Discharge: Continued Medical Work up   Patient Goals and CMS Choice Patient states their goals for this hospitalization and ongoing recovery are:: Going to SNF for STR          Expected Discharge Plan and Services In-house Referral: Clinical Social Work     Living arrangements for the past 2 months: Skilled Nursing Facility                                      Prior Living Arrangements/Services Living arrangements for the past 2 months: Skilled Nursing Facility Lives with:: Self Patient language and need for interpreter reviewed:: Yes        Need for Family Participation in Patient Care: Yes (Comment) Care giver support system in place?: No (comment) Current home services: DME (hospital bed, RW, liftchair) Criminal Activity/Legal Involvement Pertinent to Current Situation/Hospitalization: No - Comment as needed  Activities of Daily Living   ADL Screening (condition at time of admission) Independently performs ADLs?: No Does the patient have a NEW difficulty with bathing/dressing/toileting/self-feeding that is expected to last >3 days?: No Does the patient have a NEW difficulty with getting in/out of bed, walking, or  climbing stairs that is expected to last >3 days?: No Does the patient have a NEW difficulty with communication that is expected to last >3 days?: No Is the patient deaf or have difficulty hearing?: No Does the patient have difficulty seeing, even when wearing glasses/contacts?: Yes Does the patient have difficulty concentrating, remembering, or making decisions?: No  Permission Sought/Granted      Share Information with NAME: [name] Neighbors     Permission granted to share info w Relationship: son  Permission granted to share info w Contact Information: [phone]  Emotional Assessment Appearance:: Appears older than stated age Attitude/Demeanor/Rapport: Engaged Affect (typically observed): Pleasant, Hopeful, Other (comment) (Animated) Orientation: : Oriented to Self, Oriented to Situation, Oriented to Place, Oriented to  Time Alcohol  / Substance Use: Not Applicable Psych Involvement: No (comment)  Admission diagnosis:  Cellulitis of left leg [L03.116] Symptomatic anemia [D64.9] Acute on chronic anemia [D64.9] Patient Active Problem List   Diagnosis Date Noted   Pressure injury of skin 10/28/2024   Cellulitis of left lower extremity 10/28/2024   Thrombocytosis 10/28/2024   Stage IV pressure ulcer of sacral region (HCC) 10/28/2024   History of right below knee amputation (HCC) 10/28/2024   Paroxysmal atrial fibrillation (HCC) 10/28/2024   Anemia of chronic disease 10/28/2024   CVA (cerebral vascular accident) (HCC) 10/06/2024   Non-healing wound of amputation stump (HCC) 06/24/2024   Wound  dehiscence 06/04/2024   Severe protein-calorie malnutrition 06/03/2024   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) 05/30/2024   Hypoalbuminemia due to protein-calorie malnutrition 05/30/2024   Osteomyelitis of vertebra, sacral and sacrococcygeal region (HCC) 05/29/2024   Primary hypercoagulable state 05/29/2024   BKA stump complication (HCC) 05/29/2024   Acute on chronic anemia 05/29/2024    Does not feel safe at home 05/29/2024   Colostomy in place [facility]) 05/29/2024   Diabetes mellitus with kidney complication, with long-term current use of insulin  (HCC) 05/29/2024   Osteomyelitis (HCC) 04/20/2024   MRSA bacteremia 04/20/2024   Bacteremia 04/20/2024   Necrotizing fasciitis (HCC) 04/20/2024   ESRD needing dialysis (HCC) 04/20/2024   Irritant contact dermatitis associated with fecal stoma 12/27/2023   Colostomy complication, unspecified (HCC) 12/27/2023   Diverticulitis of large intestine with complication 10/27/2023   Foot callus 02/27/2021   Counseled about COVID-19 virus infection 02/24/2020   Aortic heart murmur 05/15/2019   Thyroid  enlargement 07/19/2014   Vitamin D  deficiency 09/03/2013   Hyperlipidemia 04/24/2013   Diabetic retinopathy (HCC) 01/23/2013   Chronic atrial fibrillation (HCC) 01/15/2013   Aortic atherosclerosis 01/11/2013   H/O non-ST elevation myocardial infarction (NSTEMI) 01/09/2013   History of CVA (cerebrovascular accident) 01/08/2013   Diabetic neuropathy 01/08/2013   Hypertension    Diabetes mellitus    PCP:  Campbell Reynolds, NP Pharmacy:   [facility] - [place], [place] - [address] [place] [address] Phone: [phone] Fax: [phone]  [facility] [address] [place]), [place] - [address] [place] ([place]) [place] [address] Phone: [phone] Fax: [phone]  [id number] - [place], [place] - [address] [place] [address] Phone: [phone] Fax: [phone]  [facility] [address] [place] [address] Phone: [phone] Fax: [phone]     Social Drivers of Health (SDOH) Social History: SDOH Screenings   Food Insecurity: No Food Insecurity (10/28/2024)  Housing: Low Risk (10/28/2024)  Transportation Needs: No Transportation Needs (10/28/2024)  Utilities: Not At Risk (10/28/2024)   Social Connections: Moderately Isolated (10/28/2024)  Tobacco Use: Low Risk (10/28/2024)   SDOH Interventions:     Readmission Risk Interventions     No data to display

## 2024-10-29 NOTE — Inpatient Diabetes Management (Signed)
 Inpatient Diabetes Program Recommendations  AACE/ADA: New Consensus Statement on Inpatient Glycemic Control (2015)  Target Ranges:  Prepandial:   less than 140 mg/dL      Peak postprandial:   less than 180 mg/dL (1-2 hours)      Critically ill patients:  140 - 180 mg/dL   Lab Results  Component Value Date   GLUCAP 232 (H) 10/29/2024   HGBA1C 5.8 (H) 10/06/2024    Review of Glycemic Control  Latest Reference Range & Units 10/28/24 07:12 10/28/24 12:28 10/28/24 17:52 10/28/24 20:21 10/29/24 07:36  Glucose-Capillary 70 - 99 mg/dL 747 (H) 661 (H) 735 (H) 247 (H) 232 (H)   Diabetes history: DM 2 Outpatient Diabetes medications:  Lantus  12 units daily Novolin R 0-9 units tid with meals Current orders for Inpatient glycemic control:  Novolog  0-9 units tid with meals and HS  Inpatient Diabetes Program Recommendations:    Please consider adding Lantus  8 units daily while in the hospital.    Thanks,  Randall Bullocks, RN, BC-ADM Inpatient Diabetes Coordinator Pager [phone]  (8a-5p)

## 2024-10-30 LAB — BASIC METABOLIC PANEL WITH GFR
Anion gap: 9 (ref 5–15)
BUN: 30 mg/dL — ABNORMAL HIGH (ref 8–23)
CO2: 27 mmol/L (ref 22–32)
Calcium: 7.7 mg/dL — ABNORMAL LOW (ref 8.9–10.3)
Chloride: 93 mmol/L — ABNORMAL LOW (ref 98–111)
Creatinine, Ser: 5.26 mg/dL — ABNORMAL HIGH (ref 0.44–1.00)
GFR, Estimated: 8 mL/min — ABNORMAL LOW
Glucose, Bld: 325 mg/dL — ABNORMAL HIGH (ref 70–99)
Potassium: 4 mmol/L (ref 3.5–5.1)
Sodium: 129 mmol/L — ABNORMAL LOW (ref 135–145)

## 2024-10-30 LAB — CBC
HCT: 24.3 % — ABNORMAL LOW (ref 36.0–46.0)
Hemoglobin: 7.7 g/dL — ABNORMAL LOW (ref 12.0–15.0)
MCH: 25.8 pg — ABNORMAL LOW (ref 26.0–34.0)
MCHC: 31.7 g/dL (ref 30.0–36.0)
MCV: 81.3 fL (ref 80.0–100.0)
Platelets: 447 10*3/uL — ABNORMAL HIGH (ref 150–400)
RBC: 2.99 MIL/uL — ABNORMAL LOW (ref 3.87–5.11)
RDW: 20.2 % — ABNORMAL HIGH (ref 11.5–15.5)
WBC: 13 10*3/uL — ABNORMAL HIGH (ref 4.0–10.5)
nRBC: 0 % (ref 0.0–0.2)

## 2024-10-30 LAB — CULTURE, BLOOD (ROUTINE X 2)
Culture: NO GROWTH
Culture: NO GROWTH
Special Requests: ADEQUATE
Special Requests: ADEQUATE

## 2024-10-30 LAB — GLUCOSE, CAPILLARY
Glucose-Capillary: 289 mg/dL — ABNORMAL HIGH (ref 70–99)
Glucose-Capillary: 270 mg/dL — ABNORMAL HIGH (ref 70–99)
Glucose-Capillary: 311 mg/dL — ABNORMAL HIGH (ref 70–99)
Glucose-Capillary: 216 mg/dL — ABNORMAL HIGH (ref 70–99)

## 2024-10-30 MED ORDER — INSULIN GLARGINE 100 UNITS/ML SOLOSTAR PEN: 15.0000 [IU] | PEN_INJECTOR | Freq: Two times a day (BID) | SUBCUTANEOUS | Status: DC

## 2024-10-30 MED ORDER — DARBEPOETIN ALFA 100 MCG/0.5ML IJ SOSY
100.0000 ug | PREFILLED_SYRINGE | INTRAMUSCULAR | Status: AC
  Administered 2024-10-30: 100 ug via SUBCUTANEOUS
  Filled 2024-10-30: qty 0.5

## 2024-10-30 MED ORDER — INSULIN GLARGINE 100 UNIT/ML ~~LOC~~ SOLN
10.0000 [IU] | Freq: Two times a day (BID) | SUBCUTANEOUS | Status: AC
  Administered 2024-10-30 (×2): 10 [IU] via SUBCUTANEOUS
  Filled 2024-10-30 (×3): qty 0.1

## 2024-10-30 MED ORDER — CHLORHEXIDINE GLUCONATE CLOTH 2 % EX PADS: 6.0000 | MEDICATED_PAD | Freq: Every day | CUTANEOUS | Status: AC

## 2024-10-30 NOTE — Progress Notes (Signed)
 " Progress Note   Patient: [name] FMW:[id number] DOB: 08-08-1956 DOA: 10/27/2024     2 DOS: the patient was seen and examined on 10/30/2024   Brief hospital course: [name] is a 69 y.o. female with medical history significant of ESRD on dialysis MWF schedule, necrotizing fasciitis and osteomyelitis of the right leg status post right below-knee amputation, perforated diverticulitis in 2025, insulin -dependent DM type II, paroxysmal atrial fibrillation Eliquis  on Eliquis , CVA, anemia of chronic disease and right PCA occlusion on Eliquis  presented emergency department with complaining of shortness of breath. Patient supposed to have wound VAC placement of the sacral area however stated that battery is dead and she does not have power at home.   Also complaining about concern for possible infection of the left lower extremity. Of note patient was seen emergency department multiple times plan to place into [facility] skilled nursing but patient eventually left AMA. She states that she wishes to go to a facility at this time instead of discharging to home. Hospitalist consulted for further evaluation management of left lower extremity cellulitis, acute on chronic anemia, thrombocytosis, wound VAC placement of the sacral wound and volume overload with need for dialysis.    Assessment and Plan: Acute on chronic anemia Symptomatic anemia Anemia of chronic disease due to ESRD Nephrologist on board for dialysis needs Status post 1 unit of blood transfusion Monitor hemoglobin closely No evidence of fasting no GI bleed     Left lower extremity cellulitis -Left lower extremity chronic wound and cellulitis with skin disruption from chronic pressure injury and developing of ulcer. - X-ray of the left foot showed improved soft tissue swelling of the forefoot since prior study.  No osseous finding. - Blood cultures has been obtained.  Patient is afebrile.  Showing leukocytosis.   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  Baseline around above 500.  Concern for reactive thrombocytosis in the setting of acute infectious/inflammatory process.   Continue to monitor platelet count.   Paroxysmal atrial fibrillation -Continue Coreg . - Holding Eliquis  in the setting of low hemoglobin.    History of CVA -Continue Lipitor .   Eliquis  on hold in the setting of acute anemia.   History of necrotizing fasciitis osteomyelitis of the right leg status post below-knee amputation History of perforated diverticulitis in 2025 No acute intervention at this time   ESRD on dialysis MWF schedule Nephrologist on board for dialysis needs Follow-up on renal function   Anion gap metabolic acidosis-secondary to ESRD Continue oral bicarb 650 mg twice daily.   Insulin -dependent DM type II Continue insulin  therapy Monitor glucose levels   Chronic physical debility and weakness from chronic underlying chronic medical health problems Have been seen by PT OT with recommendation for SNF     DVT prophylaxis:  SCDs   Code Status:  Full Code   Diet: Renal and carb modified   Family Communication: No family present at bedside however I discussed the case with them  over the phone.  Disposition Plan: SNF w   Consults: PT, OT, case management and nephrology   Subjective:  Seen and examined at bedside this morning Patient generally improving Denies  chest pain nausea vomiting abdominal pain   Physical Exam: Constitutional: Elderly female laying in bed in no acute distress HENT:     Mouth/Throat:     Mouth: Mucous membranes are moist.  Eyes:     Pupils: Pupils are equal, round, and reactive to light.  Cardiovascular:     Rate and Rhythm: Normal rate and regular rhythm.     Pulses: Normal pulses.     Heart sounds: Normal heart sounds.  Pulmonary:     Effort: Pulmonary effort is normal.     Breath sounds: Normal breath sounds.  Abdominal:     General: Bowel sounds are normal.  Musculoskeletal:     Cervical back: Neck supple.     Right lower leg: Right below-knee amputation    Left lower leg: Peeling of the skin noted    Comments: Left lower extremity wound  Skin:    Capillary Refill: Capillary refill takes less than 2 seconds.  Neurological:     Mental Status: She is alert and oriented to person, place, and time.  Psychiatric:        Mood and Affect: Mood normal.      Data Reviewed:  Vitals:   10/29/24 1956 10/29/24 2220 10/30/24 0444 10/30/24 0739  BP: (!) 87/71 (!) 118/58 (!) 106/50 110/79  Pulse: 81 78 79 81  Resp: 18  18 18   Temp: 98.4 F (36.9 C)  98.3 F (36.8 C) 97.6 F (36.4 C)  TempSrc:    Oral  SpO2: 94% 99% 93% 95%  Weight:      Height:          Latest Ref Rng & Units 10/30/2024    2:43 AM 10/29/2024    3:02 AM 10/28/2024   12:23 AM  CBC  WBC 4.0 - 10.5 K/uL 13.0  14.3  21.0   Hemoglobin 12.0 - 15.0 g/dL 7.7  7.8  6.6   Hematocrit 36.0 - 46.0 % 24.3  24.5  21.3   Platelets 150 - 400 K/uL 447  585  736        Latest Ref Rng & Units 10/30/2024    2:43 AM 10/29/2024    3:02 AM 10/28/2024   12:23 AM  BMP  Glucose 70 - 99 mg/dL 674  749  767   BUN 8 - 23 mg/dL 30  56  66   Creatinine 0.44 - 1.00 mg/dL 4.73  1.19  0.06   Sodium 135 - 145 mmol/L 129  127  130   Potassium 3.5 - 5.1 mmol/L 4.0  4.3  4.8   Chloride 98 - 111 mmol/L 93  93  94   CO2 22 - 32 mmol/L 27  21  20    Calcium  8.9 - 10.3 mg/dL 7.7   8.1  8.9     Author: Drue ONEIDA Potter, MD 10/30/2024 4:43 PM  For on call review [email or web address].  "

## 2024-10-30 NOTE — TOC Progression Note (Addendum)
 Transition of Care [facility]) - Progression Note    Patient Details  Name: [name] MRN: [id number] Date of Birth: [birth year]  Transition of Care [facility]) CM/SW Contact  Lendia Dais, [place] Phone Number: 10/30/2024, 4:07 PM  Clinical Narrative:  CSW spoke to the pt at bedside and informed them of current bed offers. Pt was agreeable to accepting the bed at [facility]. CSW informed Lynnea of [facility] who stated isolation precautions would be a barrier, CSW notified MD. Pt will need insurance auth and contact for the weekend at [facility] for any questions would be [place] or [place] [phone]).  CSW will start auth when medically appropriate.    Expected Discharge Plan: Skilled Nursing Facility Barriers to Discharge: Continued Medical Work up               Expected Discharge Plan and Services In-house Referral: Clinical Social Work     Living arrangements for the past 2 months: Skilled Nursing Facility                                       Social Drivers of Health (SDOH) Interventions SDOH Screenings   Food Insecurity: No Food Insecurity (10/28/2024)  Housing: Low Risk (10/28/2024)  Transportation Needs: No Transportation Needs (10/28/2024)  Utilities: Not At Risk (10/28/2024)  Social Connections: Moderately Isolated (10/28/2024)  Tobacco Use: Low Risk (10/28/2024)    Readmission Risk Interventions     No data to display

## 2024-10-30 NOTE — Progress Notes (Addendum)
 " [facility] Progress Note   Subjective:    Seen and examined patient at bedside. Patient's son also at bedside. Tolerated yesterday's HD with net UF 3L. Appears there were no issues with her TDC during yesterday's HD after dwelling with Cath-Flo overnight. She's on RA and BP is stable. Next HD tomorrow then change back to MWF schedule on Monday.  Objective Vitals:   10/29/24 1956 10/29/24 2220 10/30/24 0444 10/30/24 0739  BP: (!) 87/71 (!) 118/58 (!) 106/50 110/79  Pulse: 81 78 79 81  Resp: 18  18 18   Temp: 98.4 F (36.9 C)  98.3 F (36.8 C) 97.6 F (36.4 C)  TempSrc:    Oral  SpO2: 94% 99% 93% 95%  Weight:      Height:       Physical Exam General: Awake, alert, on RA, NAD Heart: S1 and S2; No murmurs, gallops, or rubs Lungs: Clear anteriorly Abdomen: Soft and non-tender Extremities: R BKA, LLE w/ blistering skin lesions, no LLE edema Dialysis Access: [facility]   Filed Weights   10/27/24 2349 10/28/24 1742  Weight: 63.5 kg 88.8 kg    Intake/Output Summary (Last 24 hours) at 10/30/2024 1308 Last data filed at 10/30/2024 1256 Gross per 24 hour  Intake 1450 ml  Output 3050 ml  Net -1600 ml    Additional Objective Labs: Basic Metabolic Panel: Recent Labs  Lab 10/28/24 0023 10/29/24 0302 10/30/24 0243  NA 130* 127* 129*  K 4.8 4.3 4.0  CL 94* 93* 93*  CO2 20* 21* 27  GLUCOSE 232* 250* 325*  BUN 66* 56* 30*  CREATININE 9.93* 8.80* 5.26*  CALCIUM  8.9 8.1* 7.7*   Liver Function Tests: No results for input(s): AST, ALT, ALKPHOS, BILITOT, PROT, ALBUMIN  in the last 168 hours. No results for input(s): LIPASE, AMYLASE in the last 168 hours. CBC: Recent Labs  Lab 10/23/24 1409 10/28/24 0023 10/29/24 0302 10/30/24 0243  WBC 15.2* 21.0* 14.3* 13.0*  NEUTROABS 12.4* 18.1*  --   --   HGB 7.1* 6.6* 7.8* 7.7*  HCT 23.6* 21.3* 24.5* 24.3*  MCV 82.2 82.2 80.1 81.3  PLT 533* 736* 585* 447*   Blood Culture    Component Value Date/Time    SDES BLOOD RIGHT ARM 10/29/2024 0302   SPECREQUEST  10/29/2024 0302    BOTTLES DRAWN AEROBIC AND ANAEROBIC Blood Culture adequate volume   CULT  10/29/2024 0302    NO GROWTH 1 DAY Performed at [facility], [address] [place], [place] [address]    REPTSTATUS PENDING 10/29/2024 0302    Cardiac Enzymes: No results for input(s): CKTOTAL, CKMB, CKMBINDEX, TROPONINI in the last 168 hours. CBG: Recent Labs  Lab 10/29/24 1420 10/29/24 1640 10/29/24 2000 10/30/24 0740 10/30/24 1124  GLUCAP 140* 229* 230* 289* 270*   Iron  Studies: No results for input(s): IRON , TIBC, TRANSFERRIN, FERRITIN in the last 72 hours. Lab Results  Component Value Date   INR 1.1 04/19/2024   INR >10.0 (HH) 10/28/2023   INR 1.32 02/13/2013   Studies/Results: No results found.  Medications:  linezolid  (ZYVOX ) IV 600 mg (10/30/24 9075)   piperacillin -tazobactam (ZOSYN )  IV 2.25 g (10/30/24 0820)    atorvastatin   40 mg Oral Daily   Chlorhexidine  Gluconate Cloth  6 each Topical Q0600   Chlorhexidine  Gluconate Cloth  6 each Topical Q0600   insulin  aspart  0-5 Units Subcutaneous QHS   insulin  aspart  0-9 Units Subcutaneous TID WC   insulin  glargine  10 Units Subcutaneous  BID   isosorbide  mononitrate  30 mg Oral Daily   latanoprost   1 drop Both Eyes QHS   silver  sulfADIAZINE    Topical BID   sodium bicarbonate   650 mg Oral BID   sodium chloride  flush  3 mL Intravenous Q12H   traMADol   50 mg Oral TID    Dialysis Orders: [facility] From jan 2026 -> 4h  B350   53kg   TDC  Heparin  2400  Assessment/Plan: # ESRD - on HD MWF, missed OP HD Monday  - on HD for an extra treatment. Next HD 2/7 then change back to MWF schedule on Monday   # Vascular Access - dwelled [facility] with cath-flo overnight (2/5) - Appears TDC ran okay with yesterday's HD - Monitor closely   # Volume - SOB presenting c/o - +some leg edema - CXR +vasc congestion and IS edema  - max UF w/ HD as tolerated -  Re-checking CXR in AM   # BP - BP's are not high, will d/c coreg  for now - Bps are improving with HD   # Anemia of esrd - S/p 1 unit PRBCs 2/4 for Hgb 6.6 - Hgb now is 7.7 - Restarting ESA here. Last dose given on 1/16 in outpatient   # PAD h/o R BKA    # LLE cellulitis - started on IV abx per pmd   # stage IV sacral decub   # chronic debility   #Dispo -Plan to discharge to SNF when medically ready  Charmaine Piety, NP [facility] 10/30/2024,1:08 PM  LOS: 2 days    "

## 2024-10-30 NOTE — Plan of Care (Signed)
" °  Problem: Education: Goal: Ability to describe self-care measures that may prevent or decrease complications (Diabetes Survival Skills Education) will improve Outcome: Progressing   Problem: Coping: Goal: Ability to adjust to condition or change in health will improve Outcome: Progressing   Problem: Nutritional: Goal: Maintenance of adequate nutrition will improve Outcome: Progressing   Problem: Skin Integrity: Goal: Risk for impaired skin integrity will decrease Outcome: Progressing   Problem: Tissue Perfusion: Goal: Adequacy of tissue perfusion will improve Outcome: Progressing   Problem: Pain Managment: Goal: General experience of comfort will improve and/or be controlled Outcome: Progressing   Problem: Safety: Goal: Ability to remain free from injury will improve Outcome: Progressing   "

## 2024-10-30 NOTE — Consult Note (Signed)
" °  CLINICAL SUPPORT TEAM - WOUND OSTOMY AND CONTINENCE TEAM  CONSULTATION SERVICES   WOC Nurse-Inpatient Note    WOC Nurse wound follow up Wound type: stage 4 pressure injury to sacrum Measurement: 4 cm x 3 cm x 1 cm with 2 cm undermining from 8 o'clock to 12 o'clock Wound bed: 100 % pink, moist, no granulation tissue visible Drainage (amount, consistency, odor) serosanguinous in cannister Periwound: macerated from 12 o'clock to 8 o'clock Dressing procedure/placement/frequency:  Removed old NPWT dressing Periwound skin protected with barrier ring Skin protected to the hip with VAC drape for foam bridge  Filled wound with  ___1_ piece of black foam  Sealed NPWT dressing at HG Patient tolerated procedure well  WOC nurse will continue to provide NPWT dressing changed due to the complexity of the dressing change.      WOC Nurse ostomy follow up Stoma type/location: LLQ colostomy Stomal assessment/size:  current appliance remains intact.  Patient requests assistance with information for ordering supplies at home.  Have provided with [employer] catalog and marked supplies needed, discussed process for ordering with patient and son.  Requested additional supplies to bedside as well.  Thank you,  Doyal Polite, MSN, RN, [facility] WOC Team [phone] (Available Mon-Fri 0700-1500)      "

## 2024-11-26 ENCOUNTER — Ambulatory Visit (HOSPITAL_COMMUNITY)

## 2024-11-26 ENCOUNTER — Encounter: Admitting: Vascular Surgery
